# Patient Record
Sex: Male | Born: 1960 | Race: Black or African American | Hispanic: No | Marital: Married | Smoking: Former smoker
Health system: Southern US, Community
[De-identification: ages and names within clinical notes are randomized; demographics above are authoritative.]

## PROBLEM LIST (undated history)

## (undated) DIAGNOSIS — I429 Cardiomyopathy, unspecified: Secondary | ICD-10-CM

## (undated) DIAGNOSIS — Z720 Tobacco use: Secondary | ICD-10-CM

## (undated) DIAGNOSIS — I77810 Thoracic aortic ectasia: Secondary | ICD-10-CM

## (undated) DIAGNOSIS — R972 Elevated prostate specific antigen [PSA]: Secondary | ICD-10-CM

## (undated) DIAGNOSIS — I428 Other cardiomyopathies: Secondary | ICD-10-CM

## (undated) DIAGNOSIS — J449 Chronic obstructive pulmonary disease, unspecified: Secondary | ICD-10-CM

## (undated) DIAGNOSIS — N182 Chronic kidney disease, stage 2 (mild): Secondary | ICD-10-CM

## (undated) DIAGNOSIS — I493 Ventricular premature depolarization: Secondary | ICD-10-CM

## (undated) DIAGNOSIS — I10 Essential (primary) hypertension: Secondary | ICD-10-CM

## (undated) DIAGNOSIS — I4729 Other ventricular tachycardia: Secondary | ICD-10-CM

## (undated) DIAGNOSIS — G4733 Obstructive sleep apnea (adult) (pediatric): Secondary | ICD-10-CM

## (undated) DIAGNOSIS — E119 Type 2 diabetes mellitus without complications: Secondary | ICD-10-CM

## (undated) DIAGNOSIS — I219 Acute myocardial infarction, unspecified: Secondary | ICD-10-CM

## (undated) DIAGNOSIS — I480 Paroxysmal atrial fibrillation: Secondary | ICD-10-CM

## (undated) DIAGNOSIS — I2510 Atherosclerotic heart disease of native coronary artery without angina pectoris: Secondary | ICD-10-CM

## (undated) DIAGNOSIS — I272 Pulmonary hypertension, unspecified: Secondary | ICD-10-CM

## (undated) DIAGNOSIS — E7800 Pure hypercholesterolemia, unspecified: Secondary | ICD-10-CM

## (undated) HISTORY — DX: Cardiomyopathy, unspecified: I42.9

## (undated) HISTORY — DX: Tobacco use: Z72.0

## (undated) HISTORY — DX: Paroxysmal atrial fibrillation: I48.0

## (undated) HISTORY — DX: Essential (primary) hypertension: I10

## (undated) HISTORY — PX: PROSTATE BIOPSY: SHX241

## (undated) HISTORY — DX: Obstructive sleep apnea (adult) (pediatric): G47.33

## (undated) HISTORY — DX: Chronic obstructive pulmonary disease, unspecified: J44.9

## (undated) HISTORY — DX: Atherosclerotic heart disease of native coronary artery without angina pectoris: I25.10

---

## 2000-10-15 HISTORY — PX: CARDIAC CATHETERIZATION: SHX172

## 2001-07-10 ENCOUNTER — Emergency Department (HOSPITAL_COMMUNITY): Admission: EM | Admit: 2001-07-10 | Discharge: 2001-07-10 | Payer: Self-pay | Admitting: *Deleted

## 2001-08-03 ENCOUNTER — Inpatient Hospital Stay (HOSPITAL_COMMUNITY): Admission: EM | Admit: 2001-08-03 | Discharge: 2001-08-08 | Payer: Self-pay | Admitting: Internal Medicine

## 2001-08-03 ENCOUNTER — Encounter: Payer: Self-pay | Admitting: Internal Medicine

## 2001-08-05 ENCOUNTER — Encounter: Payer: Self-pay | Admitting: *Deleted

## 2001-10-02 ENCOUNTER — Encounter: Payer: Self-pay | Admitting: Internal Medicine

## 2001-10-02 ENCOUNTER — Emergency Department (HOSPITAL_COMMUNITY): Admission: EM | Admit: 2001-10-02 | Discharge: 2001-10-02 | Payer: Self-pay | Admitting: Internal Medicine

## 2002-03-03 ENCOUNTER — Emergency Department (HOSPITAL_COMMUNITY): Admission: EM | Admit: 2002-03-03 | Discharge: 2002-03-03 | Payer: Self-pay | Admitting: Emergency Medicine

## 2002-07-01 ENCOUNTER — Encounter: Payer: Self-pay | Admitting: Emergency Medicine

## 2002-07-01 ENCOUNTER — Emergency Department (HOSPITAL_COMMUNITY): Admission: EM | Admit: 2002-07-01 | Discharge: 2002-07-01 | Payer: Self-pay | Admitting: Emergency Medicine

## 2005-03-29 ENCOUNTER — Emergency Department (HOSPITAL_COMMUNITY): Admission: EM | Admit: 2005-03-29 | Discharge: 2005-03-29 | Payer: Self-pay | Admitting: *Deleted

## 2005-09-12 ENCOUNTER — Inpatient Hospital Stay (HOSPITAL_COMMUNITY): Admission: EM | Admit: 2005-09-12 | Discharge: 2005-09-13 | Payer: Self-pay | Admitting: Emergency Medicine

## 2005-09-15 ENCOUNTER — Emergency Department (HOSPITAL_COMMUNITY): Admission: EM | Admit: 2005-09-15 | Discharge: 2005-09-15 | Payer: Self-pay | Admitting: Emergency Medicine

## 2006-03-01 ENCOUNTER — Ambulatory Visit (HOSPITAL_COMMUNITY): Admission: RE | Admit: 2006-03-01 | Discharge: 2006-03-01 | Payer: Self-pay | Admitting: Family Medicine

## 2006-03-03 ENCOUNTER — Emergency Department (HOSPITAL_COMMUNITY): Admission: EM | Admit: 2006-03-03 | Discharge: 2006-03-03 | Payer: Self-pay | Admitting: Emergency Medicine

## 2006-03-05 ENCOUNTER — Inpatient Hospital Stay (HOSPITAL_COMMUNITY): Admission: EM | Admit: 2006-03-05 | Discharge: 2006-03-08 | Payer: Self-pay | Admitting: Emergency Medicine

## 2006-12-15 ENCOUNTER — Emergency Department (HOSPITAL_COMMUNITY): Admission: EM | Admit: 2006-12-15 | Discharge: 2006-12-15 | Payer: Self-pay | Admitting: Emergency Medicine

## 2006-12-17 ENCOUNTER — Emergency Department (HOSPITAL_COMMUNITY): Admission: EM | Admit: 2006-12-17 | Discharge: 2006-12-17 | Payer: Self-pay | Admitting: Emergency Medicine

## 2008-03-24 ENCOUNTER — Emergency Department (HOSPITAL_COMMUNITY): Admission: EM | Admit: 2008-03-24 | Discharge: 2008-03-24 | Payer: Self-pay | Admitting: Emergency Medicine

## 2010-07-25 ENCOUNTER — Ambulatory Visit: Payer: Self-pay | Admitting: Cardiology

## 2010-07-25 ENCOUNTER — Observation Stay (HOSPITAL_COMMUNITY): Admission: EM | Admit: 2010-07-25 | Discharge: 2010-07-26 | Payer: Self-pay | Admitting: Emergency Medicine

## 2010-07-26 ENCOUNTER — Encounter: Payer: Self-pay | Admitting: Cardiology

## 2010-07-31 ENCOUNTER — Emergency Department (HOSPITAL_COMMUNITY): Admission: EM | Admit: 2010-07-31 | Discharge: 2010-07-31 | Payer: Self-pay | Admitting: Emergency Medicine

## 2010-08-02 ENCOUNTER — Encounter (INDEPENDENT_AMBULATORY_CARE_PROVIDER_SITE_OTHER): Payer: Self-pay | Admitting: *Deleted

## 2010-08-02 ENCOUNTER — Encounter (HOSPITAL_COMMUNITY): Admission: RE | Admit: 2010-08-02 | Discharge: 2010-09-01 | Payer: Self-pay | Admitting: Cardiology

## 2010-08-02 ENCOUNTER — Ambulatory Visit: Payer: Self-pay | Admitting: Cardiology

## 2010-08-07 ENCOUNTER — Ambulatory Visit: Payer: Self-pay | Admitting: Cardiology

## 2010-08-07 DIAGNOSIS — I2510 Atherosclerotic heart disease of native coronary artery without angina pectoris: Secondary | ICD-10-CM

## 2010-08-07 DIAGNOSIS — I10 Essential (primary) hypertension: Secondary | ICD-10-CM | POA: Insufficient documentation

## 2010-08-07 DIAGNOSIS — R002 Palpitations: Secondary | ICD-10-CM

## 2010-08-21 ENCOUNTER — Encounter (INDEPENDENT_AMBULATORY_CARE_PROVIDER_SITE_OTHER): Payer: Self-pay | Admitting: *Deleted

## 2010-08-25 ENCOUNTER — Ambulatory Visit: Payer: Self-pay | Admitting: Cardiology

## 2010-09-06 ENCOUNTER — Encounter (INDEPENDENT_AMBULATORY_CARE_PROVIDER_SITE_OTHER): Payer: Self-pay | Admitting: *Deleted

## 2010-11-14 NOTE — Letter (Signed)
Summary: Work Writer at [employer]  [address] [place] [address]   Phone: [phone]  Fax: [phone]     August 02, 2010    James [name]   The above named patient had a STRESS TEST today that ended at 1:00 pm. Please excuse him from work till 08/03/10. Patient can return to work with out restrictions. Please take this into consideration when reviewing the time away from work/school.      Sincerely yours,  Architectural technologist

## 2010-11-14 NOTE — Letter (Signed)
Summary: Appointment - Missed  [facility] [place]  [address], [place] [address]   Phone: [phone]  Fax: [phone]     September 06, 2010 MRN: [id number]   James [name] [address] [place], [place]  [address]   Dear [name],  Our records indicate you missed your appointment on               09/06/10 NURSE VISIT             It is very important that we reach you to reschedule this appointment. We look forward to participating in your health care needs. Please contact us at the number listed above at your earliest convenience to reschedule this appointment.     Sincerely,    Glass blower/designer

## 2010-11-14 NOTE — Assessment & Plan Note (Signed)
Summary: post Eather [facility]/tg   Visit Type:  Follow-up Primary Provider:  Dr.Fanta  CC:  no cardiology complaints.  History of Present Illness: [name] is a very pleasant 50 y/o AAF we are seeing post hospitalization where we consulted for frequent PVC's and chest pain.  He has a history of of CAD with most recent cath in 2002 demonstrating nonobstructive CAD, hypertension, insomnia, and tobacco abuse.  He was ruled out for MI during hospitalization and was found to have excessive caffine use with Mt. Dew at home.  He has since cut down on it.  He was given antiinsomia medication -benedryl to assist him on outpatient basis by Dr. Sudie Bailey.  He is sleeping better and has increased energy.  He also had a nuclear stress test prior to this appointment and he is here to discuss the results.  He is without complaint.  Current Medications (verified): 1)  Amlodipine Besylate 10 Mg Tabs (Amlodipine Besylate) .... Take One Tablet By Mouth Daily 2)  Cetirizine Hcl 10 Mg Tabs (Cetirizine Hcl) .... Take 1 Tab Daily 3)  Aspirin 81 Mg Tbec (Aspirin) .... Take 1 Tab Daily 4)  Clonidine Hcl 0.1 Mg Tabs (Clonidine Hcl) .... Take 1 Tab Daily  Allergies (verified): No Known Drug Allergies PMH-FH-SH reviewed-no changes except otherwise noted  Review of Systems       All other systems have been reviewed and are negative unless stated above.   Vital Signs:  Patient profile:   50 year old male Height:      78 inches Weight:      271 pounds BMI:     31.43 Pulse rate:   77 / minute BP sitting:   152 / 81  (right arm)  Vitals Entered By: Dreama Saa, CNA (August 07, 2010 2:48 PM)  Physical Exam  General:  Well developed, well nourished, in no acute distress. Lungs:  Clear bilaterally to auscultation and percussion. Heart:  Non-displaced PMI, chest non-tender; regular rate and rhythm, S1, S2 without murmurs, rubs or gallops. Carotid upstroke normal, no bruit. Normal abdominal aortic size, no  bruits. Femorals normal pulses, no bruits. Pedals normal pulses. No edema, no varicosities. Abdomen:  Bowel sounds positive; abdomen soft and non-tender without masses, organomegaly, or hernias noted. No hepatosplenomegaly. Msk:  Back normal, normal gait. Muscle strength and tone normal. Pulses:  pulses normal in all 4 extremities Extremities:  No clubbing or cyanosis. Psych:  Normal affect.   EKG  Procedure date:  08/07/2010  Findings:      Normal sinus rhythm with rate of:70 bpm  Left bundle branch block.    Impression & Recommendations:  Problem # 1:  CAD (ICD-414.00) He is without complaint of chest pain.  His last cardiac cath in 2002 demonstrated nonobstructive CAD with normal LM, LAD, Cx 40-50%, RCA 20%, with normal systolic fx.  Nuclear study completed 08/02/2010 No clearly diagnostic ST-segment changed noted by standard criteria.  Ventricular ectopy was noted in early exercise.  Hypertensive response to exercise.  Perfusion imaging shows apical anteroseptal and inferolateral defects suggestive of a scar.  No active areas of ischemia are noted. LVEF 42%  with inferior hypokinesis. I will continue his current medication regimine at this time we will see him in 6 months unless he becomes symptomatic His updated medication list for this problem includes:    Amlodipine Besylate 10 Mg Tabs (Amlodipine besylate) .Marland Kitchen... Take one tablet by mouth daily    Aspirin 81 Mg Tbec (Aspirin) .Marland Kitchen... Take 1 tab  daily  Problem # 2:  ESSENTIAL HYPERTENSION, BENIGN (ICD-401.1) Blood pressure is not opitmal for someone with CAD.  Will increase norvasc to 10mg  daily from 5 mg.  Do not wish to increase clonidine at this time to avoid bradycardia at higher doses.  He will follow-up in a couple fo weeks to have BP checked on new dose. His updated medication list for this problem includes:    Amlodipine Besylate 10 Mg Tabs (Amlodipine besylate) .Marland Kitchen... Take one tablet by mouth daily    Aspirin 81 Mg Tbec  (Aspirin) .Marland Kitchen... Take 1 tab daily    Clonidine Hcl 0.1 Mg Tabs (Clonidine hcl) .Marland Kitchen... Take 1 tab daily  Problem # 3:  PALPITATIONS, OCCASIONAL (ICD-785.1) Assessment: Improved  His updated medication list for this problem includes:    Amlodipine Besylate 10 Mg Tabs (Amlodipine besylate) .Marland Kitchen... Take one tablet by mouth daily    Aspirin 81 Mg Tbec (Aspirin) .Marland Kitchen... Take 1 tab daily  Patient Instructions: 1)  Your physician recommends that you schedule a follow-up appointment in: 6 months 2)  Your physician has recommended you make the following change in your medication: amlodipine 10mg  daily 3)  You have been referred to nurse visit in 2 weeks Prescriptions: AMLODIPINE BESYLATE 10 MG TABS (AMLODIPINE BESYLATE) Take one tablet by mouth daily  #30 x 11   Entered by:   Teressa Lower RN   Authorized by:   Joni Reining, NP   Signed by:   Teressa Lower RN on 08/07/2010   Method used:   Electronically to        [facility] [phone]* (retail)       [address]       [place], [place]  [address]       Ph: [phone] or [phone]       Fax: [phone]   RxID:   [id number]

## 2010-11-14 NOTE — Letter (Signed)
Summary: Appointment - Missed  [facility] [place]  [address] [place] [address]   Phone: [phone]  Fax: [phone]     August 21, 2010 MRN: [id number]   James [name] [address] [place], [place]  [address]   Dear [name],  Our records indicate you missed your appointment on       08/21/10 NURSE VISIT   .                                    It is very important that we reach you to reschedule this appointment. We look forward to participating in your health care needs. Please contact us at the number listed above at your earliest convenience to reschedule this appointment.     Sincerely,    Glass blower/designer

## 2010-11-14 NOTE — Assessment & Plan Note (Signed)
Summary: 2 wk nurse visit per checkout on 08/07/10/tg  Nurse Visit   Vital Signs:  Patient profile:   50 year old male Weight:      276 pounds O2 Sat:      99 % on Room air Pulse rate:   82 / minute BP sitting:   154 / 90  (left arm)  Vitals Entered By: Larita Fife Via LPN (August 25, 2010 8:45 AM)  O2 Flow:  Room air  Current Medications (verified): 1)  Amlodipine Besylate 10 Mg Tabs (Amlodipine Besylate) .... Take One Tablet By Mouth Daily 2)  Cetirizine Hcl 10 Mg Tabs (Cetirizine Hcl) .... Take 1 Tab Daily 3)  Aspirin 81 Mg Tbec (Aspirin) .... Take 1 Tab Daily 4)  Clonidine Hcl 0.1 Mg Tabs (Clonidine Hcl) .... Take 1 Tab Daily  Allergies (verified): No Known Drug Allergies  Primary Provider:  Dr.Fanta   History of Present Illness: S: Pt. returns to office for 2 week BP check/nurse visit. B: On last OV with Joni Reining, NP on 08-07-10 pt. was advised to increase Norvasc to 10mg  for HTN.  A: Pt. has no complaints at this time. BP =154/90, he has not taken medications this am due to working 3rd shift, he takes meds at bedtime. He did not bring meds but states he is taking as directed and he did not bring BP diary.  R: Will call pt. with Joni Reining, NP's recommendation's, if any.  Prescriptions: HYDROCHLOROTHIAZIDE 25 MG TABS (HYDROCHLOROTHIAZIDE) take 1 tablet by mouth once daily  #30 x 3   Entered by:   Larita Fife Via LPN   Authorized by:   Joni Reining, NP   Signed by:   Larita Fife Via LPN on 04/54/0981   Method used:   Electronically to        [facility] [phone]* (retail)       [address]       [place], [place]  [address]       Ph: [phone] or [phone]       Fax: [phone]   RxID:   [id number]    Add HCTZ 25mg  p.p, Daily.  Want BP to be 130's systolic.  BP check by the end of the week on new medication.  Joni Reining NP  [facility]       Larita Fife Via LPN  August 28, 2010 5:03 PM  Pt. advised, he states he understands info. given.  Larita Fife Via LPN  August 29, 2010 4:40 PM

## 2010-11-14 NOTE — Letter (Signed)
Summary: Work Writer at [employer]  [address], [place] [address]   Phone: [phone]  Fax: [phone]     August 02, 2010    James [name]   The above named patient had a STRESS TEST that lasted till 1:00, please excuse him form work till Thursday 08/03/10 Please take this into consideration when reviewing the time away from work/school.      Sincerely yours,  Architectural technologist

## 2010-12-22 NOTE — Discharge Summary (Signed)
  NAME:  James [name], James [name]               ACCOUNT NO.:  [id number]  MEDICAL RECORD NO.:  [id number]          PATIENT TYPE:  OBV  LOCATION:  A310                          FACILITY:  [facility]  PHYSICIAN:  Pier Laux D. Felecia Shelling, MD   DATE OF BIRTH:  05-07-1961  DATE OF ADMISSION:  07/25/2010 DATE OF DISCHARGE:  10/12/2011LH                              DISCHARGE SUMMARY   DISCHARGE DIAGNOSES: 1. Chest pain, probably noncardiac. 2. History of hypertension. 3. Tobacco abuse. 4. History of coronary artery disease.  DISCHARGE MEDICATIONS: 1. Amlodipine 5 mg p.o. daily. 2. Zyrtec 10 mg p.o. daily.  DISPOSITION:  The patient was discharged to home in stable condition.  HOSPITAL COURSE:  This is a 50 year old male patient with a history of hypertension, who was brought to emergency room due to chest discomfort. He had serial EKGs and cardiac enzymes which were negative.  The patient had a cardiac catheterization in 2002, which showed nonobstructive coronary artery disease.  The patient was evaluated by Cardiology.  He was discharged home to be followed in outpatient.  DISCHARGE INSTRUCTIONS:  The patient will follow with Cardiology.     Graelyn Bihl D. Felecia Shelling, MD     TDF/MEDQ  D:  11/29/2010  T:  11/29/2010  Job:  [id number]  Electronically Signed by Avon Gully MD on 12/21/2010 08:23:15 AM

## 2010-12-27 LAB — BASIC METABOLIC PANEL
BUN: 21 mg/dL (ref 6–23)
BUN: 14 mg/dL (ref 6–23)
CO2: 26 mEq/L (ref 19–32)
CO2: 26 mEq/L (ref 19–32)
Calcium: 9.3 mg/dL (ref 8.4–10.5)
Calcium: 9.1 mg/dL (ref 8.4–10.5)
Chloride: 103 mEq/L (ref 96–112)
Chloride: 106 mEq/L (ref 96–112)
Creatinine, Ser: 1.14 mg/dL (ref 0.4–1.5)
Creatinine, Ser: 1.1 mg/dL (ref 0.4–1.5)
GFR calc Af Amer: >60 mL/min (ref >60)
GFR calc Af Amer: >60 mL/min (ref >60)
GFR calc non Af Amer: >60 mL/min (ref >60)
GFR calc non Af Amer: >60 mL/min (ref >60)
Glucose, Bld: 97 mg/dL (ref 70–99)
Glucose, Bld: 103 mg/dL — ABNORMAL HIGH (ref 70–99)
Potassium: 3.7 mEq/L (ref 3.5–5.1)
Potassium: 3.6 mEq/L (ref 3.5–5.1)
Sodium: 138 mEq/L (ref 135–145)
Sodium: 139 mEq/L (ref 135–145)

## 2010-12-27 LAB — CBC
HCT: 48.1 % (ref 39.0–52.0)
HCT: 47.5 % (ref 39.0–52.0)
Hemoglobin: 16.6 g/dL (ref 13.0–17.0)
Hemoglobin: 16.2 g/dL (ref 13.0–17.0)
MCH: 31.5 pg (ref 26.0–34.0)
MCH: 31.3 pg (ref 26.0–34.0)
MCHC: 34.5 g/dL (ref 30.0–36.0)
MCHC: 34.1 g/dL (ref 30.0–36.0)
MCV: 91.3 fL (ref 78.0–100.0)
MCV: 91.7 fL (ref 78.0–100.0)
Platelets: 187 10*3/uL (ref 150–400)
Platelets: 184 10*3/uL (ref 150–400)
RBC: 5.28 MIL/uL (ref 4.22–5.81)
RBC: 5.18 MIL/uL (ref 4.22–5.81)
RDW: 13.5 % (ref 11.5–15.5)
RDW: 13.3 % (ref 11.5–15.5)
WBC: 10 10*3/uL (ref 4.0–10.5)
WBC: 10 10*3/uL (ref 4.0–10.5)

## 2010-12-27 LAB — POCT CARDIAC MARKERS
CKMB, poc: <1 ng/mL — ABNORMAL LOW (ref 1.0–8.0)
CKMB, poc: <1 ng/mL — ABNORMAL LOW (ref 1.0–8.0)
Myoglobin, poc: 56 ng/mL (ref 12–200)
Myoglobin, poc: 37.1 ng/mL (ref 12–200)
Troponin i, poc: <0.05 ng/mL (ref 0.00–0.09)
Troponin i, poc: <0.05 ng/mL (ref 0.00–0.09)

## 2010-12-27 LAB — CARDIAC PANEL(CRET KIN+CKTOT+MB+TROPI)
CK, MB: 2.4 ng/mL (ref 0.3–4.0)
Relative Index: 1.7 (ref 0.0–2.5)
Total CK: 141 U/L (ref 7–232)
Troponin I: 0.03 ng/mL (ref 0.00–0.06)

## 2010-12-27 LAB — HEPATIC FUNCTION PANEL
ALT: 25 U/L (ref 0–53)
AST: 18 U/L (ref 0–37)
Albumin: 3.4 g/dL — ABNORMAL LOW (ref 3.5–5.2)
Alkaline Phosphatase: 73 U/L (ref 39–117)
Bilirubin, Direct: 0.1 mg/dL (ref 0.0–0.3)
Indirect Bilirubin: 0.8 mg/dL (ref 0.3–0.9)
Total Bilirubin: 0.9 mg/dL (ref 0.3–1.2)
Total Protein: 5.7 g/dL — ABNORMAL LOW (ref 6.0–8.3)

## 2010-12-27 LAB — DIFFERENTIAL
Basophils Absolute: 0 10*3/uL (ref 0.0–0.1)
Basophils Relative: 0 % (ref 0–1)
Eosinophils Absolute: 0.2 10*3/uL (ref 0.0–0.7)
Eosinophils Relative: 2 % (ref 0–5)
Lymphocytes Relative: 30 % (ref 12–46)
Lymphs Abs: 3 10*3/uL (ref 0.7–4.0)
Monocytes Absolute: 0.9 10*3/uL (ref 0.1–1.0)
Monocytes Relative: 9 % (ref 3–12)
Neutro Abs: 5.9 10*3/uL (ref 1.7–7.7)
Neutrophils Relative %: 59 % (ref 43–77)

## 2010-12-27 LAB — LIPID PANEL
Cholesterol: 182 mg/dL (ref 0–200)
HDL: 39 mg/dL — ABNORMAL LOW (ref >39)
LDL Cholesterol: 121 mg/dL — ABNORMAL HIGH (ref 0–99)
Total CHOL/HDL Ratio: 4.7 RATIO
Triglycerides: 111 mg/dL (ref <150)
VLDL: 22 mg/dL (ref 0–40)

## 2010-12-27 LAB — TSH: TSH: 1.242 u[IU]/mL (ref 0.350–4.500)

## 2011-03-02 NOTE — Consult Note (Signed)
NAME:  James [name], James [name]               ACCOUNT NO.:  [id number]   MEDICAL RECORD NO.:  [id number]          PATIENT TYPE:  INP   LOCATION:  A310                          FACILITY:  [facility]   PHYSICIAN:  J. Darreld Mclean, M.D. DATE OF BIRTH:  May 03, 1961   DATE OF CONSULTATION:  03/06/2006  DATE OF DISCHARGE:                                   CONSULTATION   CHIEF COMPLAINT:  My foot is infected.   The patient is a 50 year old male who has had swelling and tenderness of his  right dorsal foot and right dorsal ankle since last Thursday.  Today is  Wednesday.  He presented to the emergency room over the weekend.  He was  given antibiotics and came back yesterday to the hospital in more pain, and  tenderness.  For this he was then placed on IV clindamycin.  His ankle and  foot are markedly improved according to the family and patient.  He says  since he has been on IV antibiotics he is much better.  The uric acid is  normal.  X-rays are negative.  His redness and tenderness of the dorsum of  the right foot more in line with the great toe and second metatarsal  extending up to the anterior tibialis area.  There is redness, but there is  no actual fluctuance.  He has pain with motion of the ankle.  He has no  other joint complaints.  No other areas of pain.  He denies any trauma.  He  denies any puncture one.  He denies any change in shoe-wear.   IMPRESSION:  Cellulitis, right foot and ankle.   PLAN:  Continue the clindamycin.  It is doing well.  Elevate and K-Pad.  We  will follow with you.  Healing.           ______________________________  Shela Commons. Darreld Mclean, M.D.     JWK/MEDQ  D:  03/06/2006  T:  03/06/2006  Job:  [id number]

## 2011-03-02 NOTE — Discharge Summary (Signed)
NAME:  James [name], James [name]               ACCOUNT NO.:  [id number]   MEDICAL RECORD NO.:  [id number]          PATIENT TYPE:  INP   LOCATION:  A225                          FACILITY:  [facility]   PHYSICIAN:  Tesfaye D. Felecia Shelling, MD   DATE OF BIRTH:  03-03-1961   DATE OF ADMISSION:  09/12/2005  DATE OF DISCHARGE:  11/30/2006LH                                 DISCHARGE SUMMARY   DISCHARGE DIAGNOSES:  1.  Hypertensive encephalopathy.  2.  Hypokalemia.  3.  Hyponatremia.   DISCHARGE MEDICATIONS:  1.  Norvasc 5 mg p.o. daily.  2.  Lisinopril/hydrochlorothiazide 20/12.5 mg 1 tablet p.o. daily.   DISPOSITION:  The patient was discharged home in stable condition.   HOSPITAL COURSE:  This is a 50 year old male patient with a history of  hypertension who was brought to the emergency room due to change in mental  status.  The patient has a known case of hypertension.  However, he was  noncompliant with his medications.  On admission, the patient had severely  elevated blood pressure in the range of 199/104.  His blood pressure came  down to 177/88 after he was given _____________ in the emergency room.  The  patient was admitted and he was monitored.  His CT scan and MRI were  negative for any acute event.  He was also evaluated by the neurologist.  The patient was started back on his antihypertensive medications.  His blood  pressure was controlled and he was discharged after his mental status  improved and he came back to his baseline.      Tesfaye D. Felecia Shelling, MD  Electronically Signed     TDF/MEDQ  D:  10/16/2005  T:  10/16/2005  Job:  [id number]

## 2011-03-02 NOTE — Procedures (Signed)
[facility]  Patient:    James [name], James [name] Visit Number: [id number] MRN: [id number]          Service Type: Attending:  Kari Baars, M.D. Dictated by:   Kari Baars, M.D. Proc. Date: 08/04/99                            EKG Interpretations  The rhythm is a sinus rhythm with a rate in the 50s.  The axis is leftward, but does not meet criteria for left axis deviation.  There are T-wave abnormalities which are diffuse and nonspecific which could represent ischemia and clinical correlation was suggested.  Abnormal electrocardiogram. Dictated by:   Kari Baars, M.D. Attending:  Kari Baars, M.D. DD:  08/04/01 TD:  08/05/01 Job: [id number]

## 2011-03-02 NOTE — Group Therapy Note (Signed)
[facility]  Patient:    James [name], James [name]. Visit Number: [id number] MRN: [id number]          Service Type: MED Location: 3700 [id number] [id number] Attending Physician:  Sherryl Manges Coch Dictated by:   Kari Baars, M.D. Proc. Date: 08/22/01 Admit Date:  08/06/2001 Discharge Date: 08/08/2001                               Progress Note  TIME:  15:37, August 03, 2001  PROCEDURE:  Electrocardiogram.  ATTENDING:  Kari Baars, M.D.  FINDINGS:  This EKG is confirmed. Dictated by:   Kari Baars, M.D. Attending Physician:  Burnett Kanaris DD:  08/22/01 TD:  08/23/01 Job: [id number]

## 2011-03-02 NOTE — Consult Note (Signed)
James [name], James [name]               ACCOUNT NO.:  [id number]   MEDICAL RECORD NO.:  [id number]          PATIENT TYPE:  INP   LOCATION:  A225                          FACILITY:  [facility]   PHYSICIAN:  Kofi A. Gerilyn Pilgrim, M.D. DATE OF BIRTH:  03/02/1961   DATE OF CONSULTATION:  09/13/2005  DATE OF DISCHARGE:                                   CONSULTATION   ASSESSMENT:  Spell of confusion in setting of hypertension.  Hypotension  encephalopathy has been raised which could be a possibility.  I am  concerned, however, that the patient may have had a seizure which should be  evaluated further.   RECOMMENDATIONS:  To this end, I am going to suggest an EEG to be done and  for him to follow up with [name] in the office.   HISTORY OF PRESENT ILLNESS:  A middle-aged, 50 year old, African-American  gentleman who presented with relatively acute confusional episode.  No loss  of consciousness has been reported.  The event seemed to last for a few  hours.  The workup has been initially positive for a blood pressure of  199/104 in the left arm and subsequently dropped to 177/88.  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  However, images of the  brain failed to reveal any signs of posterior leukoencephalopathy/white  matter hyperintensity.  The patient had returned to baseline without any  focal deficits.  The patient reports having a spell about 1-2 years ago  where he passed out/blacked out for about 4 hours and gradually regained  consciousness.  In the hospital, it was also noted that the patient had a  syncopal episode in 2002.  He does have history of nonobstructive coronary  disease.  He has had a Cardiolite stress test which shows ejection fraction  of 60%.  He does have a history of hypertension.   PAST MEDICAL HISTORY:  Hypertension.   CURRENT MEDICATIONS:  Potassium, triamterene/hydrochlorothiazide and  Catapres.   PHYSICAL EXAMINATION:  GENERAL:  A pleasant man in no acute  distress.  The  patient is currently awake and alert.  He converses well.  VITAL SIGNS:  Temperature 97.0, pulse 103, respirations 20, blood pressure  139/79.  NEUROLOGIC:  No focal deficits are reported.   LABORATORY DATA AND X-RAY FINDINGS:  MRI of the brain was reviewed.  No  acute ischemic changes are noted on diffusion imaging.  The white matter  appears perfect.  There is no single lesion noted.  Essentially negative  scan.   Urine drug screen was negative.  Urinalysis was fine.  WBC 30, hemoglobin  15, platelet count 204.  Sodium 134, potassium 3.3, chloride 102, CO2 24,  glucose 110, BUN 13, creatinine 1.0, calcium 8.2.  Liver enzymes normal.   Thank you for this consultation.      Kofi A. Gerilyn Pilgrim, M.D.  Electronically Signed     KAD/MEDQ  D:  09/13/2005  T:  09/13/2005  Job:  [id number]

## 2011-03-02 NOTE — Discharge Summary (Signed)
James [name], James [name]               ACCOUNT NO.:  [id number]   MEDICAL RECORD NO.:  [id number]          PATIENT TYPE:  INP   LOCATION:  A310                          FACILITY:  [facility]   PHYSICIAN:  Corrie Mckusick, M.D.  DATE OF BIRTH:  [birth year]   DATE OF ADMISSION:  03/05/2006  DATE OF DISCHARGE:  05/25/2007LH                                 DISCHARGE SUMMARY   HISTORY OF PRESENT ILLNESS:  Please see admission H&P.   PAST MEDICAL HISTORY:  Please see admission H&P.   HOSPITAL COURSE:  The patient is a 50 year old gentleman with history of  hypertension who presents with four to five days of right lower foot  swelling.  He went to the emergency department two days prior to admission  and was placed on antibiotics which was clindamycin, with little  improvement.  He came to the emergency department for continued swelling.  He was admitted for IV antibiotics, and we had an orthopedics consult to get  their input.   Orthopedics agreed with the use of __________, clindamycin, and followed  with [name].  No other real input.  Blood cultures remained negative during the  stay.  The foot improved greatly, even after 24 hours of antibiotics.  Antibiotics were continued.   Two days after admission, the patient felt remarkably well.  No fevers.  Again, blood cultures were negative.  White count was 9.6.  It was decided  to elevate and give him one more day on IV antibiotics.  The patient was  ready for discharge the following day, and Dr. Regino Schultze saw him and  discharged him on clindamycin 300 mg q.i.d. and Vicodin p.r.n.  He was going  to continue on his pre-hospital medications of Norvasc 10 mg daily and  lisinopril/HCTZ 20/12.5 p.o. daily.   DISCHARGE PHYSICAL EXAMINATION:  Please see note on day of discharge for  details.   CONDITION ON DISCHARGE:  Improved and stable.   FOLLOW UP:  He is going to follow up with me in the office in one week.      Corrie Mckusick, M.D.  Electronically Signed     JCG/MEDQ  D:  03/25/2006  T:  03/25/2006  Job:  [id number]

## 2011-03-02 NOTE — Discharge Summary (Signed)
[facility]  Patient:    James [name], James [name]. Visit Number: [id number] MRN: [id number]          Service Type: MED Location: 3700 3703 [id number] Attending Physician:  Sherryl Manges Coch Dictated by:   Avon Gully, M.D. Admit Date:  08/06/2001 Discharge Date: 08/08/2001                             Discharge Summary  DISCHARGE DIAGNOSES: 1. Syncopal episode. 2. Coronary artery disease. 3. Hypertension. 4. Hypertensive heart disease.  DISPOSITION:  The patient was transferred to [facility] under cardiology service for cardiac catheterization.  HOSPITAL COURSE:  This is a 50 year old black male with history of hypertension who was admitted to [facility] on August 03, 2001, due to a syncopal episode.  On the day of admission the patient claims he went outside to get some wood, however, he suddenly passed out on the ground. He was brought to the emergency room where he was evaluated and was admitted under telemetry.  Serial cardiac enzymes and EKG was performed.  His EKG showed an abnormal Q wave depression.  His stress Cardiolite showed a mild anterior wall hypokinesia with an ejection fraction of 45%.  The patient was then transferred to [facility] under the cardiology service for cardiac catheterization. Dictated by:   Avon Gully, M.D. Attending Physician:  Burnett Kanaris DD:  08/19/01 TD:  08/20/01 Job: [id number]

## 2011-03-02 NOTE — Cardiovascular Report (Signed)
[place]  Patient:    James [name], James [name]. Visit Number: [id number] MRN: [id number]          Service Type: MED Location: 3700 [id number] [id number] Attending Physician:  Nathen May Dictated by:   Daisey Must, M.D. [facility] Proc. Date: 08/08/01 Admit Date:  08/06/2001 Discharge Date: 08/08/2001   CC:         Avon Gully, M.D., Larina Bras, M.D. [facility]  Cardiac Catheterization Lab   Cardiac Catheterization  PROCEDURE:  Right and left heart catheterization with coronary angiography and left ventriculography.  CARDIOLOGIST:  Daisey Must, M.D. [facility]  INDICATION:  [name] is a 50 year old male with cardiovascular risk factors who was admitted to [facility] after an episode of syncope.  Prior to this, he had an episode of nocturnal dyspnea.  His initial EKG showed inferolateral T wave inversion.  Cardiolite scan showed suggestion of decreased left ventricular systolic function with no evidence of ischemia.  He was, therefore, referred for cardiac catheterization to rule out structural or ischemic heart disease.  CATHETERIZATION PROCEDURAL NOTE:  A 6-French sheath was placed in the right femoral artery.  Standard Judkins 6-French catheters were utilized.  Contrast was Omnipaque.  There were no complications.  CATHETERIZATION RESULTS:  HEMODYNAMICS:  Left ventricular pressure 160/15, aortic pressure 160/86. There was no aortic valve gradient.  LEFT VENTRICULOGRAM:  Wall motion is normal.  Ejection fraction calculated at 60%.  There is trace mitral regurgitation.  CORONARY ANGIOGRAPHY: (Right dominant).  Left main is normal.  Left anterior descending artery has minor luminal irregularities in the mid vessel.  The LAD gives rise to two normal size diagonal branches.  Left circumflex has a 40 to 50% stenosis in the distal vessel beyond a large OM-2.  The vessel is relatively small at this point.  The circumflex  gives rise to a small OM-1, large OM-2, and small third and fourth marginal branches.  Right coronary artery has a 20% stenosis in the mid vessel.  It gives rise to a normal size posterior descending artery and normal size posterolateral branch.  IMPRESSIONS: 1. Normal left ventricular systolic function. 2. No significant coronary artery disease identified. Dictated by:   Daisey Must, M.D. [facility] Attending Physician:  Nathen May DD:  08/08/01 TD:  08/10/01 Job: [id number]

## 2011-03-02 NOTE — H&P (Signed)
[facility]  Patient:    James [name], James [name]. Visit Number: [id number] MRN: [id number]          Service Type: MED Location: 2A A204 01 Attending Physician:  Cassell Smiles. Admit Date:  08/03/2001                           History and Physical  DATE OF BIRTH:  2061/04/03  HISTORY OF PRESENT ILLNESS:  The patient is a 50 year old black male followed by Dr. Felecia Shelling with a past medical history remarkable for hypertension on atenolol about one year.  He has had no medication over the past three or four day.  He was in his usual state of health until this morning when he awaken and went outside to get some wood.  The next thing he knew, he was on the ground.  He denies any presyncopal symptoms.  He sustained a left elbow abrasion.  He initially felt a little disoriented, but when he "came to," he was able to get up, get about, and go back in the house and inform his wife. There was no history of incontinence, no dizziness, no orthostasis, and no chest pain, shortness of breath, or other complaints.  He had an episode about one month ago when he awaken suddenly in bed, feeling as if his breath had been cut off, but this resolved.  He does admit to nighttime reflux.  The review of systems is essentially unremarkable.  He has lost about 20 pounds in the last several months intentionally.  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  He has had no chest pain, shortness of breath, or change in exercise habits.  In the emergency room, he was in no distress.  His vital signs were stable.  He does have a sinus bradycardia.  His EKG revealed inverted Ts in inferior leads and lateral V3-6.  The initial CK was 216 with an MB of 2.8, a troponin of 0.03, and a relative index of 1.3.  The remaining laboratory data was otherwise unremarkable.  He had an alcohol level of 7 (normal 0-10).  He denies drinking any alcohol.  I am not sure what this means.  SOCIAL HISTORY:  Lives  with his wife.  He has three children, alive and well. Self-employed as well as works as a Merchandiser, retail at [employer].  Tobacco abuse x about 22 years of one pack per day.  No other drug or alcohol use.  MEDICATIONS:  Atenolol 100 mg daily.  No over-the-counter medications.  No herbal supplements or vitamins.  FAMILY HISTORY:  Positive MI in his father probably in his 81s with diabetes and hypertension as well.  Mother alive and well.  Three siblings, one with hypertension.  I assume he is the second of four children since he names himself as the middle.  REVIEW OF SYSTEMS:  No change in exercise tolerance.  Positive snoring. Usually sleeps well.  Occasional reflux symptoms, particularly at night.  None recently due to lifestyle changes.  Probably also is related to weight loss. No previous history of syncope or seizures.  He was an active athlete without any history.  No previous history of surgeries or hospitalizations.  ASSESSMENT AND PLAN: 1. Syncopal episode with worrisome electrocardiographic findings in a    gentleman with hypertension and a positive family history.  He has been    told that his cholesterol is high.  I do not have a lipid  profile, although    this has been ordered.  I thought this is a CNS event.  He did have a CT in    the emergency room which was reported as unremarkable.  Will admit,    monitor, and check enzymes.  Add aspirin.  Continue atenolol.  He is    bradycardic and has not had his medications in several days.  this will    need to be further evaluated.  Will have cardiology evaluate this patient    given these findings. 2. Tobacco abuse. 3. Obesity, improved. 4. Reflux.  The plan was discussed with the patient and his wife, who seemed to have reasonable understanding. Attending Physician:  Cassell Smiles DD:  08/03/01 TD:  08/04/01 Job: [id number]

## 2011-03-02 NOTE — H&P (Signed)
James [name], James [name]               ACCOUNT NO.:  [id number]   MEDICAL RECORD NO.:  [id number]          PATIENT TYPE:  EMS   LOCATION:  ED                            FACILITY:  [facility]   PHYSICIAN:  Mila Homer. Sudie Bailey, M.D.DATE OF BIRTH:  1961/07/18   DATE OF ADMISSION:  09/11/2005  DATE OF DISCHARGE:  LH                                HISTORY & PHYSICAL   This 50 year old reported to work at [employer] and was confused.  He was brought to the [facility] Emergency Room for workup.   He is a patient of Dr. Avon Gully.  Apparently he stopped all his blood  pressure pills about 4 months ago.  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  Further, he was smoking and  stopped his smoking about a week ago.   Patient has really had no fever or chills.  He had left earache within the  last few days but not now.  He has had no sore throat.  He has had a frontal  headache, apparently takes a lot of headache pills at [employer].  Further, he  has had epigastric abdominal pain off and on for some months and has been  concerned about this but has not had workup by a physician.   He had a similar episode of confusion about 4 years ago.   Based on his discharge summary through Dr. Avon Gully, he had suddenly  passed out on the ground.  He had a syncopal episode, coronary artery  disease, hypertension, hypertensive heart disease.  Ejection fraction at  that time was about 45%.  He had a stress Cardiolite which showed mild  dilatation of left ventricle and moderate anterior wall hyperkinesis.  His  cardiac cath showed a 20% mid RCA, a 40-50% mid circumflex and a 20% mid  LAD, ejection fraction to be 60%.  He is felt to have nonobstructive  coronary artery disease based on these findings.  The syncope was felt to be  of unknown etiology.  He was recommended to stop smoking, have his blood  pressure checked at home, and he is to take Altace 5 mg daily  at bedtime and  not take his atenolol.   At this particular time he has not had fever or chills, earache was  mentioned but was just fleeting, apparently he has not had sore throat, neck  pain, neck stiffness, chest pain, palpitations but has had the epigastric  abdominal pain intermittently as noted above.  He has had no urinary  symptomatology although he did have diarrhea three times recently.   Apparently meds he has been on most recently include Altace and triamterene  37.5/hydrochlorothiazide 25 mg daily.   Admission workup showed temperature 99 degrees p.o., blood pressure 199/104  in the left arm, this value dropped to 177/88, pulse 92, respiratory rate  22, his O2 saturation is 100%.  At the time I examined him he appeared to be  oriented, alert, still a little bit foggy.  His family was in attendance  including his sister, mother and girlfriend.  He was well developed, well  nourished, no acute distress.  His pupils equal reactive to light.  EOMs  were intact.  His TMs were gray, good light reflexes and landmarks  bilaterally.  The pharynx was normal.  There were negative anterior cervical  nodes. No maxillary or frontal sinus pain.  The lungs were clear throughout.  The heart had a regular rhythm rate of about 70.  There is no  supraclavicular adenopathy.  The abdomen was soft without hepatosplenomegaly  or mass or epigastric pain.  There is no edema of the ankles.   Blood work included a white cell count of 13,500 of which 88% were  neutrophils, 7% lymphs.  The H&H were 15.6 and 45.5, MCV of 90, platelet  count of 204,000.  His blood test showed a sodium of 134, potassium 3.3,  glucose of 110, BUN 13, creatinine 1.  LFTs were normal.  B-type natriuretic  peptide was less than 30.   His urine drug screen was negative.  UA showed an SG of 1.020 and a pH of 7.  There were 0-2 wbc's and 0-2 rbc's per HPF.  Troponin was less than 0.05.  Myoglobin 116.   ADMISSION  DIAGNOSES:  1.  Possible hypertensive encephalopathy.  2.  Sleep deprivation.  3.  Hypokalemia.  4.  Hyponatremia.  5.  History of tobacco use disorder.  6.  Nonobstructive coronary artery disease.  7.  Essential hypertension.   Plan with him is to admit him to observation and have him seen by him LMD,  Dr. Felecia Shelling in the morning.  We will treat him with clonidine 0.2 mg q.4 h.  for systolic BPs greater than 160 and diastolic BPs greater than 110.  We  will put him on 2 g sodium diet and have dietitian review with him in the  morning a 2 g sodium diet, keep him on IV of normal saline KVO and put him  on KCl 20 mEq twice daily and Maxzide 25/37.5 daily.      Mila Homer. Sudie Bailey, M.D.  Electronically Signed     SDK/MEDQ  D:  09/11/2005  T:  09/11/2005  Job:  [id number]
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
## 2011-03-02 NOTE — H&P (Signed)
. [facility]  Patient:    James [name], James [name]. Visit Number: [id number] MRN: [id number]          Service Type: MED Location: 2A A204 01 Attending Physician:  Cassell Smiles. Admit Date:  08/03/2001                           History and Physical  CONTINUATION  GENERAL APPEARANCE:  [name] is an alert, oriented, well-developed, well-nourished, slightly obese black male in no acute distress lying near supine.  Able to get up and move about independently.  NEUROLOGIC:  Exam is completely intact, including cranial nerves.  LUNGS:  Clear to auscultation anteriorly and posteriorly, though slightly diminished secondary to habitus.  HEART:  Regular rate and rhythm.  I cannot appreciate a murmur or gallop.  ABDOMEN:  Obese, soft, and nontender.  EXTREMITIES:  Without clubbing, cyanosis, or edema.  NECK:  Supple.  No JVD, adenopathy, thyromegaly, or bruits. Attending Physician:  Cassell Smiles DD:  08/03/01 TD:  08/04/01 Job: [id number]

## 2011-03-02 NOTE — Procedures (Signed)
[name], James [name]               ACCOUNT NO.:  [id number]   MEDICAL RECORD NO.:  [id number]          PATIENT TYPE:  INP   LOCATION:  A225                          FACILITY:  [facility]   PHYSICIAN:  Dani Gobble, MD       DATE OF BIRTH:  25-Jun-1961   DATE OF PROCEDURE:  09/12/2005  DATE OF DISCHARGE:  09/13/2005                                  ECHOCARDIOGRAM   REFERRING:  Dr. Felecia Shelling.   INDICATIONS:  Mr. Robitaille is a 50 year old gentleman with past medical history  of hypertensive encephalopathy referred for chest pain and to evaluate LV  function.   Technical quality of this study is adequate.   The aorta measures dilated at the root at 4.6 cm. It does appear to taper  down beyond the root, but this is not well visualized.   The left atrium is mildly dilated measured at 4.2 cm. The patient appeared  to be in sinus rhythm during this procedure. No obvious clots or masses were  appreciated.   The interventricular septum posterior wall are mildly concentrically  thickened.   The aortic valve appeared to be trileaflet with normal leaflet excursion. No  significant aortic insufficiency is noted. Doppler interrogation of the  aortic valve is within normal limits.   The mitral valve appeared grossly structurally normal. No obvious mitral  valve prolapse is appreciated. Mild mitral regurgitation is noted. Doppler  interrogation of the mitral valve is within normal limits.   Pulmonic valve appears grossly structurally normal.   Tricuspid valve also appears grossly structurally normal with mild tricuspid  regurgitation noted.   The left ventricle measures at the upper limits of normal in size with the  LVIDD measured t 5.6 cm and the LVISD measured at 3.4 cm. Overall left  systolic function is normal, and no regional wall motion abnormalities are  appreciated.   The right atrium and right ventricle appear normal in size. Right ventricle  systolic function appears to be normal.   The  inner atrial septum in one view only is somewhat suggestive of a PFO.  This is not well visualized.   IMPRESSION:  1.  Mildly dilated aortic root at 4.6 cm, which appears to taper down as you      move into the ascending aorta; however, this is not well visualized.  2.  Mild left atrial enlargement.  3.  Mild concentric LVH.  4.  Mild mitral and tricuspid regurgitation.  5.  Left ventricle at the upper limits of normal in size with normal left      ventricle systolic function and no regional wall motion abnormality is      noted.  6.  There is a subtle suggestion in one view only of a PFO; however, this is      not well visualized: Consider contrast study for further delineation.           ______________________________  Dani Gobble, MD     AB/MEDQ  D:  09/12/2005  T:  09/13/2005  Job:  [id number]

## 2011-03-02 NOTE — Procedures (Signed)
[facility]  Patient:    James [name], James [name]. Visit Number: [id number] MRN: [id number]          Service Type: MED Location: 2A A204 01 Attending Physician:  Cassell Smiles. Dictated by:   Marcelino Duster    P.A.-C. Admit Date:  08/03/2001                                Stress Test  This is a 50 year old black male patient admitted with syncope.  Baseline EKG: Sinus bradycardia at 46 beats per minute with T-wave inversion in the inferior lateral lead.  Patient exercised 11 minutes 25 seconds with the Bruce protocol, but stopped due to fatigue and dizziness.  He had no chest pain.  He obtained a heart rate of 141.  Target heart rate was 153.  He did have frequent PVCs and bigemini.  Target heart rate was not reached because baseline was in the 40s and he takes the atenolol at home.  It was held today. He had no EKG changes and Cardiolite images are to follow.  Symptoms resolved quickly in recovery. Dictated by:   Marcelino Duster    P.A.-C. Attending Physician:  Cassell Smiles DD:  08/05/01 TD:  08/06/01 Job: [id number]

## 2011-03-02 NOTE — H&P (Signed)
NAME:  James [name], James [name]               ACCOUNT NO.:  [id number]   MEDICAL RECORD NO.:  [id number]          PATIENT TYPE:  INP   LOCATION:  A310                          FACILITY:  [facility]   PHYSICIAN:  Corrie Mckusick, M.D.  DATE OF BIRTH:  [birth year]   DATE OF ADMISSION:  03/05/2006  DATE OF DISCHARGE:  LH                                HISTORY & PHYSICAL   ADMITTING DIAGNOSES:  Right foot cellulitis.   HISTORY OF PRESENT ILLNESS:  This is a 50 year old gentleman with history of  hypertension who presents with four to five days of right lower foot  swelling.  He had been seen in the emergency department two days prior to  admission, placed on antibiotics which I believe was clindamycin with little  improvement.  Came in the emergency department and had continued swelling.  It was decided to admit him to the hospital for IV antibiotics.  Really no  other complaints.  Cardiovascular, respiratory, GI, neurologic review of  systems all negative.   PAST MEDICAL HISTORY:  1.  Hypertension.  2.  Non-obstructive coronary artery disease.  3.  Tobacco use.   PAST SURGICAL HISTORY:  None.   MEDICATIONS ON ADMISSION:  1.  Norvasc 10 mg p.o. daily.  2.  Lisinopril/HCTZ 20/12.5 p.o. daily.   ALLERGIES:  No known drug allergies.   SOCIAL HISTORY:  Does smoke.  No illicit drug use.  No alcohol use.   FAMILY HISTORY:  Significant for hypertension.  No prior history of  diabetes.   PHYSICAL EXAMINATION:  VITAL SIGNS:  Temperature 98, blood pressure 150/89,  respiratory rate 20, heart rate 74.  GENERAL:  Pleasant general, talkative, in no acute distress.  CHEST:  Clear to auscultation bilaterally.  CARDIOVASCULAR:  Regular rate and rhythm.  Normal S1-2.  No S3, murmurs,  gallops, or rubs.  ABDOMEN:  Soft, nontender.  EXTREMITIES:  Right lower extremity has swelling and warmth on the foot,  clearly warm over the dorsum of the foot, questionable site of infection.  It looks like there might be a  small abscess as well.   Blood cultures drawn.  X-ray of the foot both from the 18th and the 20th are  negative.   ASSESSMENT/PLAN:  A 50 year old gentleman with hypertension, right lower  foot cellulitis.   PLAN:  1.  Admit for double coverage of Rocephin and clindamycin IV.  2.  Orthopedics to take a look and get their input.  Will continue to follow      closely.      Corrie Mckusick, M.D.  Electronically Signed     JCG/MEDQ  D:  03/06/2006  T:  03/06/2006  Job:  [id number]

## 2011-07-12 LAB — BASIC METABOLIC PANEL
BUN: 20
CO2: 28
Calcium: 9.2
Chloride: 103
Creatinine, Ser: 1.29
GFR calc Af Amer: >60
GFR calc non Af Amer: 60 — ABNORMAL LOW
Glucose, Bld: 103 — ABNORMAL HIGH
Potassium: 4.5
Sodium: 138

## 2011-07-12 LAB — B-NATRIURETIC PEPTIDE (CONVERTED LAB): Pro B Natriuretic peptide (BNP): <30

## 2011-07-12 LAB — POCT CARDIAC MARKERS
CKMB, poc: 2.3
Myoglobin, poc: 65.8
Myoglobin, poc: 71.2

## 2011-07-12 LAB — CBC
HCT: 47.4
Hemoglobin: 16.3
MCHC: 34.4
MCV: 91.7
Platelets: 191
RBC: 5.17
RDW: 13.3
WBC: 9.3

## 2011-07-12 LAB — DIFFERENTIAL
Basophils Absolute: 0
Basophils Relative: 0
Lymphocytes Relative: 24
Neutro Abs: 6.1
Neutrophils Relative %: 66

## 2011-07-25 IMAGING — NM NM MYOCAR MULTI W/SPECT W/WALL MOTION & EF
2 series · 12 of 12 positions shown · non-contrast
Comparison: none

Ordering Physician: Jaylon Aujla

[place] Physician: [REDACTED]al Data: 49-year-old male with history of nonobstructive CAD,
hypertension, tobacco abuse, and recent palpitations as well as
chest discomfort.  This study is requested to evaluate for the
presence of ischemia.
NUCLEAR MEDICINE STRESS MYOVIEW STUDY WITH SPECT AND LEFT
VENTRICULAR EJECTION FRACTION
Radionuclide Data: One-day rest/stress protocol performed with
[DATE] mCi of Mc-BBm Myoview.
Stress Data: The patient was exercised on a Bruce protocol for 10
minutes and 52 seconds achieving a maximum workload of 12.3 mets.
Heart rate increased from 65 beats per minute up to 162 beats per
minute which was 94% of the maximum age predicted heart rate
response.  Blood pressure increased from 162/88 up to 202/90.  No
chest pain was reported.  Bursts of nonsustained ventricular
tachycardia were noted, up to four beats, with some ventricular
couplets and occasional to frequent PVCs.  These are mainly noted
in early exercise, generally suppressed with increased heart rate.
No clearly diagnostic ST-segment changes were noted by standard
criteria.
EKG: Resting electrocardiogram shows normal sinus rhythm at 61
beats per minute with inferolateral T-wave inversions, nonspecific
ST changes.  Poor anterior R-wave progression.
Scintigraphic Data: Analysis of the raw perfusion data finds
adequate radiotracer uptake with mild soft tissue attenuation.
Tomographic views were obtained using the the short axis, vertical
long axis, and horizontal long axis planes.  There is a small,
moderate intensity apical anteroseptal defect that is fixed.  There
is also a small, moderate intensity inferolateral defect that is
fixed.  No significant areas of reversibility are noted to indicate
ischemia.
Gated imaging reveals an EDV of 186, ESV 108, T I D ratio of 0.75,
and LVEF of 42% with inferior hypokinesis.

[Series 1: cr cardiac tc low dose · 6.41mm/px · 6 of 64 frames shown]
[frame 6/64]
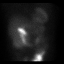
[frame 16/64]
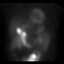
[frame 27/64]
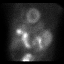
[frame 38/64]
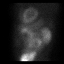
[frame 48/64]
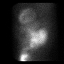
[frame 59/64]
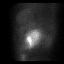

[Series 1: cs cardiac tc hi dose · 6.41mm/px · 6 of 512 frames shown]
[frame 43/512]
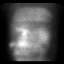
[frame 128/512]
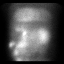
[frame 214/512]
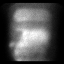
[frame 299/512]
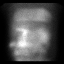
[frame 384/512]
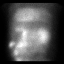
[frame 470/512]
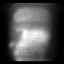

[12 of 12 positions shown; findings below may reference images not displayed]

IMPRESSION: Abnormal exercise Myoview.  No clearly diagnostic ST-segment
changes are noted by standard criteria.  Ventricular ectopy
including brief nonsustained ventricular tachycardia (four beats at
most) was noted in early exercise, suppressed at increased heart
rate.  Hypertensive response noted to exercise.  Perfusion imaging
shows apical anteroseptal and inferolateral defects as described
above, suggestive of scar.  No active areas of ischemia are noted.
LVEF reduced to 42% with inferior hypokinesis.  This would be
consistent with underlying CAD.

## 2011-09-24 ENCOUNTER — Encounter: Payer: Self-pay | Admitting: Cardiology

## 2011-09-24 ENCOUNTER — Telehealth: Payer: Self-pay

## 2011-09-24 NOTE — Telephone Encounter (Signed)
LMOM for pt to call. 

## 2011-10-02 NOTE — Telephone Encounter (Signed)
Letter mailed to pt to call.  

## 2012-03-04 ENCOUNTER — Other Ambulatory Visit (HOSPITAL_COMMUNITY): Payer: Self-pay | Admitting: Internal Medicine

## 2012-03-05 ENCOUNTER — Ambulatory Visit (HOSPITAL_COMMUNITY)
Admission: RE | Admit: 2012-03-05 | Discharge: 2012-03-05 | Disposition: A | Discharge status: Home / Self Care | Payer: BC Managed Care – PPO | Source: Ambulatory Visit | Attending: Internal Medicine | Admitting: Internal Medicine

## 2012-03-05 ENCOUNTER — Other Ambulatory Visit (HOSPITAL_COMMUNITY): Payer: Self-pay | Admitting: Internal Medicine

## 2012-03-05 DIAGNOSIS — R52 Pain, unspecified: Secondary | ICD-10-CM

## 2012-03-05 DIAGNOSIS — R932 Abnormal findings on diagnostic imaging of liver and biliary tract: Secondary | ICD-10-CM | POA: Insufficient documentation

## 2012-03-05 DIAGNOSIS — R109 Unspecified abdominal pain: Secondary | ICD-10-CM | POA: Insufficient documentation

## 2012-03-06 ENCOUNTER — Other Ambulatory Visit (HOSPITAL_COMMUNITY): Payer: Self-pay | Admitting: Internal Medicine

## 2012-03-06 DIAGNOSIS — R109 Unspecified abdominal pain: Secondary | ICD-10-CM

## 2012-03-07 ENCOUNTER — Encounter (HOSPITAL_COMMUNITY): Payer: Self-pay

## 2012-03-07 ENCOUNTER — Ambulatory Visit (INDEPENDENT_AMBULATORY_CARE_PROVIDER_SITE_OTHER): Payer: BC Managed Care – PPO | Admitting: Adult Health

## 2012-03-07 ENCOUNTER — Other Ambulatory Visit: Payer: Self-pay | Admitting: Adult Health

## 2012-03-07 ENCOUNTER — Encounter (HOSPITAL_COMMUNITY)
Admission: RE | Admit: 2012-03-07 | Discharge: 2012-03-07 | Disposition: A | Discharge status: Home / Self Care | Payer: BC Managed Care – PPO | Source: Ambulatory Visit | Attending: Internal Medicine | Admitting: Internal Medicine

## 2012-03-07 ENCOUNTER — Encounter: Payer: Self-pay | Admitting: Adult Health

## 2012-03-07 VITALS — BP 174/118 | HR 89 | Resp 16 | Ht 78.0 in | Wt 296.0 lb

## 2012-03-07 DIAGNOSIS — R109 Unspecified abdominal pain: Secondary | ICD-10-CM | POA: Insufficient documentation

## 2012-03-07 DIAGNOSIS — I10 Essential (primary) hypertension: Secondary | ICD-10-CM

## 2012-03-07 DIAGNOSIS — I2510 Atherosclerotic heart disease of native coronary artery without angina pectoris: Secondary | ICD-10-CM

## 2012-03-07 MED ORDER — AMLODIPINE BESYLATE 10 MG PO TABS: 10.0000 mg | ORAL_TABLET | Freq: Every day | ORAL | Status: DC

## 2012-03-07 MED ORDER — TECHNETIUM TC 99M MEBROFENIN IV KIT
5.0000 | PACK | Freq: Once | INTRAVENOUS | Status: AC | PRN
  Administered 2012-03-07: 5 via INTRAVENOUS

## 2012-03-07 MED ORDER — POTASSIUM CHLORIDE ER 10 MEQ PO TBCR: EXTENDED_RELEASE_TABLET | ORAL | Status: DC

## 2012-03-07 MED ORDER — SINCALIDE 5 MCG IJ SOLR
INTRAMUSCULAR | Status: AC
  Administered 2012-03-07: 2.55 ug via INTRAVENOUS
  Filled 2012-03-07: qty 5

## 2012-03-07 MED ORDER — FUROSEMIDE 20 MG PO TABS: ORAL_TABLET | ORAL | Status: DC

## 2012-03-07 MED ORDER — PANTOPRAZOLE SODIUM 40 MG PO TBEC: 40.0000 mg | DELAYED_RELEASE_TABLET | Freq: Every day | ORAL | Status: DC

## 2012-03-07 MED ORDER — CLONIDINE HCL 0.1 MG/24HR TD PTWK: 1.0000 | MEDICATED_PATCH | TRANSDERMAL | Status: DC

## 2012-03-07 NOTE — Assessment & Plan Note (Signed)
He is having ongoing heartburn symptoms which is related to hypertension and GB issues, I believe. Will add PPI to medication regimen. If LV fx is substantially decreased will consider repeating cath vs myoview. He is not cleared for GB surgery at this time secondary to uncontrolled BP.

## 2012-03-07 NOTE — Progress Notes (Signed)
   HPI: [name] is 51 y/o patient of Dr. Dietrich Pates, lost to follow-up with known history of CAD, nonobstructive per cath in 2002, hypertension.  He comes today for evaluation prior having cholecystectomy which is yet to be planned He is very hypertensive and has not been taking medications listed.  Review of his medication bottles show only HCTZ.  He has gained 15 lbs in the last two weeks. He has recurrent abdominal pain and has been diagnosed with cholecystis. He admits to noncompliance with dietary restrictions of sodium. He is noticing abdominal distention with some LEE. He has been having ongoing heartburn.  No Known Allergies  Current Outpatient Prescriptions  Medication Sig Dispense Refill  . amLODipine (NORVASC) 10 MG tablet Take 10 mg by mouth daily.        Marland Kitchen aspirin 81 MG tablet Take 81 mg by mouth daily.        . cetirizine (ZYRTEC) 10 MG tablet Take 10 mg by mouth daily.        . cloNIDine (CATAPRES) 0.1 MG tablet Take 0.1 mg by mouth daily.        . hydrochlorothiazide (HYDRODIURIL) 25 MG tablet Take 25 mg by mouth daily.         No current facility-administered medications for this visit.   Facility-Administered Medications Ordered in Other Visits  Medication Dose Route Frequency Provider Last Rate Last Dose  . sincalide [facility]) 5 MCG injection        2.55 mcg at 03/07/12 1020  . technetium TC 67M mebrofenin (CHOLETEC) injection 5 milli Curie  5 milli Curie Intravenous Once PRN Medication Radiologist, MD   5 milli Curie at 03/07/12 0920    Past Medical History  Diagnosis Date  . Palpitations   . CAD (coronary artery disease)   . Hypertension   . Tobacco abuse   . Sleep deprivation     Past Surgical History  Procedure Date  . Cardiac catheterization 2002    ZHY:QMVHQI of systems complete and found to be negative unless listed above PHYSICAL EXAM BP 174/118  Pulse 89  Resp 16  Ht 6\' 6"  (1.981 m)  Wt 296 lb (134.265 kg)  BMI 34.21 kg/m2  General: Well  developed, well nourished, in no acute distress Head: Eyes PERRLA, No xanthomas.   Normal cephalic and atramatic  Lungs: Clear bilaterally to auscultation and percussion. Heart: HRRR S1 S2, with prominent S4. .  Pulses are 2+ & equal.            No carotid bruit. No JVD.  No abdominal bruits. No femoral bruits. Abdomen: Bowel sounds are positive, abdomen soft and non-tender without masses or  Hernia's noted.Mild abdominal distention with positive Murphy's sign. Msk:  Back normal, normal gait. Normal strength and tone for age. Extremities: No clubbing, cyanosis or edema.  DP +1 Neuro: Alert and oriented X 3. Psych:  Good affect, responds appropriately  EKG: SR with poor R-wave progression.  LAE. Rate of 86 bpm.   ASSESSMENT AND PLAN

## 2012-03-07 NOTE — Patient Instructions (Signed)
Your physician recommends that you schedule a follow-up appointment in:  1 - 2 week follow up with the provider 2 - 1 week blood pressure check  Your physician has recommended you make the following change in your medication:  1 - START Aspirin 81 mg daily 2 - START Amlodipine 10 mg daily 3 - START Lasix 20 mg today x 3 days along with potassium supplement 10 meq. 4 - START Protonix 40 mg daily  Your physician has recommended that you have a sleep study. This test records several body functions during sleep, including: brain activity, eye movement, oxygen and carbon dioxide blood levels, heart rate and rhythm, breathing rate and rhythm, the flow of air through your mouth and nose, snoring, body muscle movements, and chest and belly movement.  Your physician has requested that you have an echocardiogram. Echocardiography is a painless test that uses sound waves to create images of your heart. It provides your doctor with information about the size and shape of your heart and how well your heart's chambers and valves are working. This procedure takes approximately one hour. There are no restrictions for this procedure.  Weigh yourself daily and bring a log with you to your follow up visit next week  Your physician recommends that you return for lab work in: TODAY and in 1 WEEK  Low salt diet (See instructions)

## 2012-03-07 NOTE — Assessment & Plan Note (Signed)
Blood pressure is elevated today with medical noncompliance. I have put him back on his amlodipine. . He has evidence of fluid overload and therefore will begin lasix 20 mg daily for 3 days with 10 mEq potassium replacement during diureses. Review of labs shows that his potassium in 4.1 with Creatinine 1.49  Check BNP to add to recent labs. . I will check a BMET and BP in one week. May need to restart clonidine if BP remains elevated. He is to weigh himself daily and avoid salt. Written instructions are provided on low salt diet.  Echocardiogram will be completed as he has not had one in 2 years with systolic dysfunction noted of 47%.

## 2012-03-08 LAB — BRAIN NATRIURETIC PEPTIDE: Brain Natriuretic Peptide: 1030 pg/mL — ABNORMAL HIGH (ref 0.0–100.0)

## 2012-03-11 ENCOUNTER — Other Ambulatory Visit (HOSPITAL_COMMUNITY): Payer: BC Managed Care – PPO

## 2012-03-13 ENCOUNTER — Encounter: Payer: Self-pay | Admitting: *Deleted

## 2012-03-14 ENCOUNTER — Ambulatory Visit (HOSPITAL_COMMUNITY): Admission: RE | Admit: 2012-03-14 | Payer: BC Managed Care – PPO | Source: Ambulatory Visit

## 2012-03-14 ENCOUNTER — Ambulatory Visit (INDEPENDENT_AMBULATORY_CARE_PROVIDER_SITE_OTHER): Payer: BC Managed Care – PPO | Admitting: *Deleted

## 2012-03-14 VITALS — BP 137/93 | HR 89 | Ht 78.0 in | Wt 277.0 lb

## 2012-03-14 DIAGNOSIS — I10 Essential (primary) hypertension: Secondary | ICD-10-CM

## 2012-03-14 NOTE — Progress Notes (Signed)
Presents for a BP check since initiation of HCTZ.  Has lost 8 pounds since 5/24 and states feeling great.  VS wnl and medications reconciled.  No complaints voiced.

## 2012-03-16 ENCOUNTER — Ambulatory Visit: Payer: BC Managed Care – PPO | Attending: Cardiology | Admitting: Sleep Medicine

## 2012-03-16 DIAGNOSIS — G4730 Sleep apnea, unspecified: Secondary | ICD-10-CM

## 2012-03-16 DIAGNOSIS — Z6832 Body mass index (BMI) 32.0-32.9, adult: Secondary | ICD-10-CM | POA: Insufficient documentation

## 2012-03-16 DIAGNOSIS — G4733 Obstructive sleep apnea (adult) (pediatric): Secondary | ICD-10-CM | POA: Insufficient documentation

## 2012-03-16 NOTE — Progress Notes (Signed)
Patient ID: James [name], male   DOB: 12/20/1960, 51 y.o.   MRN: [id number]  Blood pressure is markedly improved.  Patient is symptomatically improved.  Return visit already scheduled for 03/25/2012. CMet is a metabolic profile has not already been ordered.

## 2012-03-20 ENCOUNTER — Telehealth: Payer: Self-pay | Admitting: Cardiology

## 2012-03-20 NOTE — Telephone Encounter (Signed)
Patient needs clearance for Galbladder removal.  Patient was just seen in office last week.  Patient will have General Anesthesia. Procedure not scheduled yet. / tg

## 2012-03-20 NOTE — Telephone Encounter (Signed)
Patient to have follow up on 6/11 after all pre-surgical testing is complete.

## 2012-03-22 NOTE — Procedures (Signed)
NAME:  James [name], James [name]               ACCOUNT NO.:  [id number]  MEDICAL RECORD NO.:  [id number]          PATIENT TYPE:  OUT  LOCATION:  SLEEP LAB                     FACILITY:  [facility]  PHYSICIAN:  Keneshia Tena A. Gerilyn Pilgrim, M.D. DATE OF BIRTH:  1960-11-16  DATE OF STUDY:  03/16/2012                           NOCTURNAL POLYSOMNOGRAM  REFERRING PHYSICIAN:  Bettey Mare. Lyman Bishop, NP  INDICATION:  This 51 year old man who presents with hypersomnia, fatigue, and snoring.  This study is being done to evaluate for obstructive sleep apnea syndrome.  MEDICATIONS:  None.  EPWORTH SLEEPINESS SCALE:  13.  BMI:  32.  ARCHITECTURAL SUMMARY:  The total recording time is 418 minutes.  Sleep efficiency 91%.  Sleep latency 10 minutes.  REM latency 71 minutes. Stage N1 7.2%, N2 57%, N3 8%, and REM sleep 27%  RESPIRATORY SUMMARY:  Baseline oxygen saturation 97, lowest saturation 80.  During REM sleep.  Diagnostic AHI 35 and RDI 37.  LIMB MOVEMENT SUMMARY:  PLM index 0.  ELECTROCARDIOGRAM SUMMARY:  Average heart rate is 76 with some PVCs noted throughout the recording.  IMPRESSION:  Moderate obstructive sleep apnea syndrome.  RECOMMENDATION:  Formal CPAP titration study.  Thanks for this referral.    Brieanna Nau A. Gerilyn Pilgrim, M.D.    KAD/MEDQ  D:  03/22/2012 14:41:11  T:  03/22/2012 14:54:45  Job:  [id number]

## 2012-03-25 ENCOUNTER — Encounter: Payer: Self-pay | Admitting: Adult Health

## 2012-03-25 ENCOUNTER — Ambulatory Visit (INDEPENDENT_AMBULATORY_CARE_PROVIDER_SITE_OTHER): Payer: BC Managed Care – PPO | Admitting: Adult Health

## 2012-03-25 VITALS — BP 147/96 | HR 91 | Ht 78.0 in | Wt 272.0 lb

## 2012-03-25 DIAGNOSIS — I2510 Atherosclerotic heart disease of native coronary artery without angina pectoris: Secondary | ICD-10-CM

## 2012-03-25 DIAGNOSIS — I10 Essential (primary) hypertension: Secondary | ICD-10-CM

## 2012-03-25 NOTE — Assessment & Plan Note (Signed)
Much better controlled with wt loss and diureses. He will be rescheduled for echocardiogram and likely be released to have lapchole per Dr. Lovell Sheehan. He will continue his other antihypertensives as directed.   He was also found to have moderate sleep apnea per sleep study. Wt loss is helpful with this. He is recommended to have CPAP. Will refer to Dr. Juanetta Gosling for settings based upon sleep study report.

## 2012-03-25 NOTE — Assessment & Plan Note (Signed)
No changes or planned testing at this time. He is stable from cardiac standpoint.

## 2012-03-25 NOTE — Progress Notes (Signed)
   HPI: Mr. Allston is a 51 y/o patient of Dr. Dietrich Pates we are seeing for ongoing assessment and treatment of CAD (non-obstructive per cath in 2002), hypertension.Marland Kitchen He is here for discussion of tests completed prior to cholecystectomy. On last visit he was found to be hypertensive with fluid retention. Echo was ordered but he did not have this done. BNP was found to be elevated at 1030. He was placed on lasix 20 mg daily for 3 days with potassium. He also had a sleep study completed.    Since last visit, his blood pressure has improved significantly from 174/118 to 147/96. I suspect he has diastolic dysfunction. He has avoided salt and started eating less. Wt is down from 296 to 272 lbs in 3 weeks.   No Known Allergies  Current Outpatient Prescriptions  Medication Sig Dispense Refill  . amLODipine (NORVASC) 10 MG tablet Take 1 tablet (10 mg total) by mouth daily.  30 tablet  12  . aspirin 81 MG tablet Take 81 mg by mouth daily.        . cetirizine (ZYRTEC) 10 MG tablet Take 10 mg by mouth daily.        . furosemide (LASIX) 20 MG tablet Daily x 3 days only  3 tablet  0  . hydrochlorothiazide (HYDRODIURIL) 25 MG tablet Take 25 mg by mouth daily.        . pantoprazole (PROTONIX) 40 MG tablet Take 1 tablet (40 mg total) by mouth daily.  30 tablet  12  . potassium chloride (K-DUR) 10 MEQ tablet One tablet daily x 3 days with lasix  3 tablet  0    Past Medical History  Diagnosis Date  . Palpitations   . CAD (coronary artery disease)   . Hypertension   . Tobacco abuse   . Sleep deprivation     Past Surgical History  Procedure Date  . Cardiac catheterization 2002    XBM:[id number] of systems complete and found to be negative unless listed above  PHYSICAL EXAM BP 147/96  Pulse 91  Ht 6\' 6"  (1.981 m)  Wt 272 lb (123.378 kg)  BMI 31.43 kg/m2  General: Well developed, well nourished, in no acute distress Head: Eyes PERRLA, No xanthomas.   Normal cephalic and atramatic  Lungs: Clear  bilaterally to auscultation and percussion. Heart: HRRR S1 S2, without MRG.  Pulses are 2+ & equal.            No carotid bruit. No JVD.  No abdominal bruits. No femoral bruits. Abdomen: Bowel sounds are positive, abdomen soft and non-tender without masses or                  Hernia's noted. Msk:  Back normal, normal gait. Normal strength and tone for age. Extremities: No clubbing, cyanosis or edema.  DP +1 Neuro: Alert and oriented X 3. Psych:  Good affect, responds appropriately    ASSESSMENT AND PLAN

## 2012-03-25 NOTE — Patient Instructions (Signed)
**Note De-identified James [name] Obfuscation** Your physician has requested that you have an echocardiogram. Echocardiography is a painless test that uses sound waves to create images of your heart. It provides your doctor with information about the size and shape of your heart and how well your heart's chambers and valves are working. This procedure takes approximately one hour. There are no restrictions for this procedure.  Your physician recommends that you continue on your current medications as directed. Please refer to the Current Medication list given to you today.  Your physician recommends that you schedule a follow-up appointment in: 4 months  

## 2012-03-27 ENCOUNTER — Ambulatory Visit (HOSPITAL_COMMUNITY)
Admission: RE | Admit: 2012-03-27 | Discharge: 2012-03-27 | Disposition: A | Discharge status: Home / Self Care | Payer: BC Managed Care – PPO | Source: Ambulatory Visit | Attending: Adult Health | Admitting: Adult Health

## 2012-03-27 DIAGNOSIS — I2510 Atherosclerotic heart disease of native coronary artery without angina pectoris: Secondary | ICD-10-CM | POA: Insufficient documentation

## 2012-03-27 DIAGNOSIS — I369 Nonrheumatic tricuspid valve disorder, unspecified: Secondary | ICD-10-CM

## 2012-03-27 DIAGNOSIS — I10 Essential (primary) hypertension: Secondary | ICD-10-CM

## 2012-03-27 NOTE — Progress Notes (Signed)
*  PRELIMINARY RESULTS* Echocardiogram 2D Echocardiogram has been performed.  Caswell Corwin 03/27/2012, 3:22 PM

## 2012-03-28 ENCOUNTER — Other Ambulatory Visit: Payer: Self-pay

## 2012-03-28 ENCOUNTER — Telehealth: Payer: Self-pay

## 2012-03-28 DIAGNOSIS — I10 Essential (primary) hypertension: Secondary | ICD-10-CM

## 2012-03-28 MED ORDER — FUROSEMIDE 20 MG PO TABS: 20.0000 mg | ORAL_TABLET | ORAL | Status: DC

## 2012-03-28 NOTE — Telephone Encounter (Signed)
**Note De-Identified Oakley Kossman Obfuscation** Pt. advised, he verbalized understanding. Lab order faxed to [facility] and RX sent to [facility] in [place] to fill. Appt. scheduled for June 20 at 2:00 with Joni Reining, NP, pt. aware./LV

## 2012-03-28 NOTE — Telephone Encounter (Signed)
**Note De-Identified Eastyn Skalla Obfuscation** Message copied by Demetrios Loll on Fri Mar 28, 2012 10:23 AM ------      Message from: Jodelle Gross      Created: Fri Mar 28, 2012  9:30 AM       Call him and let him know that his echo was abnormal. We cannot clear him for surgery until we get his medications tightened up. He will need to be on lasix daily, 20mg . He will need BMET on Monday and follow up appointment next week for discussion.

## 2012-03-31 ENCOUNTER — Telehealth: Payer: Self-pay

## 2012-03-31 MED ORDER — CARVEDILOL 6.25 MG PO TABS: 6.2500 mg | ORAL_TABLET | Freq: Two times a day (BID) | ORAL | Status: DC

## 2012-03-31 MED ORDER — LISINOPRIL 2.5 MG PO TABS: 2.5000 mg | ORAL_TABLET | Freq: Every day | ORAL | Status: DC

## 2012-03-31 NOTE — Telephone Encounter (Signed)
**Note De-Identified Cayle Thunder Obfuscation** Message copied by Demetrios Loll on Mon Mar 31, 2012  8:26 AM ------      Message from: Jodelle Gross      Created: Mon Mar 31, 2012  8:18 AM       Please begin this patient on Coreg 6.25 mg BID, Lisinopril 2.5 mg daily, and stop amlodipine. Due to low EF, will need medication changes. He is coming in on Thursday, but will need to be placed on these new medications now.            Samara Deist

## 2012-03-31 NOTE — Telephone Encounter (Signed)
**Note De-Identified James [name] Obfuscation** Pt. Advised, he verbalized understanding.  [facility] sent to [facility] to fill/LV

## 2012-04-03 ENCOUNTER — Encounter: Payer: Self-pay | Admitting: Adult Health

## 2012-04-03 ENCOUNTER — Ambulatory Visit (INDEPENDENT_AMBULATORY_CARE_PROVIDER_SITE_OTHER): Payer: BC Managed Care – PPO | Admitting: Adult Health

## 2012-04-03 VITALS — BP 150/102 | HR 85 | Resp 18 | Ht 78.0 in | Wt 280.0 lb

## 2012-04-03 DIAGNOSIS — I2510 Atherosclerotic heart disease of native coronary artery without angina pectoris: Secondary | ICD-10-CM

## 2012-04-03 DIAGNOSIS — I5022 Chronic systolic (congestive) heart failure: Secondary | ICD-10-CM

## 2012-04-03 DIAGNOSIS — Z01818 Encounter for other preprocedural examination: Secondary | ICD-10-CM

## 2012-04-03 DIAGNOSIS — I10 Essential (primary) hypertension: Secondary | ICD-10-CM

## 2012-04-03 DIAGNOSIS — Z7901 Long term (current) use of anticoagulants: Secondary | ICD-10-CM

## 2012-04-03 DIAGNOSIS — I5020 Unspecified systolic (congestive) heart failure: Secondary | ICD-10-CM

## 2012-04-03 DIAGNOSIS — I428 Other cardiomyopathies: Secondary | ICD-10-CM | POA: Insufficient documentation

## 2012-04-03 LAB — CBC WITH DIFFERENTIAL/PLATELET
Basophils Absolute: 0 10*3/uL (ref 0.0–0.1)
HCT: 49.3 % (ref 39.0–52.0)
Lymphocytes Relative: 29 % (ref 12–46)
Lymphs Abs: 2.5 10*3/uL (ref 0.7–4.0)
MCV: 89 fL (ref 78.0–100.0)
Neutro Abs: 5.4 10*3/uL (ref 1.7–7.7)
Platelets: 187 10*3/uL (ref 150–400)
RBC: 5.54 MIL/uL (ref 4.22–5.81)
RDW: 14.1 % (ref 11.5–15.5)
WBC: 8.6 10*3/uL (ref 4.0–10.5)

## 2012-04-03 MED ORDER — FUROSEMIDE 40 MG PO TABS: 40.0000 mg | ORAL_TABLET | ORAL | Status: DC

## 2012-04-03 MED ORDER — LISINOPRIL 5 MG PO TABS: 5.0000 mg | ORAL_TABLET | Freq: Every day | ORAL | Status: DC

## 2012-04-03 NOTE — Patient Instructions (Addendum)
**Note De-Identified James [name] Obfuscation** Your physician has recommended you make the following change in your medication: increase Lasix to 40 mg daily and increase Lisinopril to 5 mg daily  Your physician has requested that you have a cardiac catheterization. Cardiac catheterization is used to diagnose and/or treat various heart conditions. Doctors may recommend this procedure for a number of different reasons. The most common reason is to evaluate chest pain. Chest pain can be a symptom of coronary artery disease (CAD), and cardiac catheterization can show whether plaque is narrowing or blocking your heart's arteries. This procedure is also used to evaluate the valves, as well as measure the blood flow and oxygen levels in different parts of your heart. For further information please visit [email or web address] Please follow instruction sheet, as given.  Your physician recommends that you return for lab work in: today  Your physician recommends that you schedule a follow-up appointment in: after cath

## 2012-04-03 NOTE — Assessment & Plan Note (Signed)
I have increased his lasix to 40 mg daily and increased lisinopril to 5 mg daily in the setting of hypertension and CHF. This may impact labs for cardiac cath. However, this will be evaluated per cathing physician with limited dye load if necessary. He is advised that if he begins to feel worsening symptoms he is to go to [facility] hospital for admission, with cardiac cath planned for Monday, June 24th.

## 2012-04-03 NOTE — Progress Notes (Signed)
 HPI: Mr. Ou is a 51 y/o patient of Dr. Rothbart we are seeing for ongoing assessment and treatment of CAD (non-obstructive per cath in 2002), hypertension.. He is here for discussion of tests completed prior to cholecystectomy. On last visit he was found to be hypertensive with fluid retention. Echo was ordered but he did not have this done. BNP was found to be elevated at 1030. He was placed on lasix 20 mg daily for 3 days with potassium.  Wt went  down from 296 to 272 lbs in 3 weeks. I ordered an echo but he did not complete this until last week.     Echo dated 03/27/2012 was found to be significantly abnormal with EF of 20%, severe global hypokinesis with virtual akinesis of the apex and inferior septal wall. He has gained 8 lbs since being seen last week, despite him being on lasix 20 mg daily. He is on ACE and BB. He states that he is feeling much more tired than usual and beginning to have some shortness of breath again.   No Known Allergies  Current Outpatient Prescriptions  Medication Sig Dispense Refill  . aspirin 81 MG tablet Take 81 mg by mouth daily.        . carvedilol (COREG) 6.25 MG tablet Take 1 tablet (6.25 mg total) by mouth 2 (two) times daily.  60 tablet  6  . cetirizine (ZYRTEC) 10 MG tablet Take 10 mg by mouth daily.        . furosemide (LASIX) 40 MG tablet Take 1 tablet (40 mg total) by mouth every morning.  30 tablet  3  . hydrochlorothiazide (HYDRODIURIL) 25 MG tablet Take 25 mg by mouth daily.        . ibuprofen (ADVIL,MOTRIN) 200 MG tablet Take 200 mg by mouth every 6 (six) hours as needed.      . pantoprazole (PROTONIX) 40 MG tablet Take 1 tablet (40 mg total) by mouth daily.  30 tablet  12  . DISCONTD: lisinopril (PRINIVIL,ZESTRIL) 2.5 MG tablet Take 1 tablet (2.5 mg total) by mouth daily.  30 tablet  6  . lisinopril (PRINIVIL,ZESTRIL) 5 MG tablet Take 1 tablet (5 mg total) by mouth daily.  30 tablet  3    Past Medical History  Diagnosis Date  . Palpitations     . CAD (coronary artery disease)   . Hypertension   . Tobacco abuse   . Sleep deprivation     Past Surgical History  Procedure Date  . Cardiac catheterization 2002    ROS:Review of systems complete and found to be negative unless listed above  PHYSICAL EXAM BP 150/102  Pulse 85  Resp 18  Ht 6' 6" (1.981 m)  Wt 280 lb (127.007 kg)  BMI 32.36 kg/m2  General: Well developed, well nourished, in no acute distress Head: Eyes PERRLA, No xanthomas.   Normal cephalic and atramatic  Lungs: Mild bibasilar crackles. No wheezes or cough. Heart: HRRR S1 S2, No MRG.  Pulses are 2+ & equal.            No carotid bruit. No JVD at 12 cm..  No abdominal bruits. No femoral bruits. Abdomen: Bowel sounds are positive, abdomen soft and non-tender without masses or                  Hernia's noted. Msk:  Back normal, normal gait. Normal strength and tone for age. Extremities: No clubbing, cyanosis,1+ edema.  DP +1 Neuro: Alert and oriented X   3. Psych:  Good affect, responds appropriately    ASSESSMENT AND PLAN 

## 2012-04-03 NOTE — Assessment & Plan Note (Signed)
Cardiac cath in 2002 demonstrated nonobstructive disease at that time.Left main is normal.See results below  Left anterior descending artery has minor luminal irregularities in the mid vessel.  The LAD gives rise to two normal size diagonal branches.  Left circumflex has a 40 to 50% stenosis in the distal vessel beyond a large OM-2.  The vessel is relatively small at this point.  The circumflex gives rise to a small OM-1, large OM-2, and small third and fourth marginal branches.  Right coronary artery has a 20% stenosis in the mid vessel.  It gives rise to a normal size posterior descending artery and normal size posterolateral branch.  IMPRESSIONS: 1. Normal left ventricular systolic function. 2. No significant coronary artery disease identified.  He had normal systolic function per echo in 4098. There has been a significant decrease in LV fx per echo completed one week ago. With history of CAD above, although non obstructive at that time, he most likely has progression of CAD. I have discussed this with the patient and with Dr. Dietrich Pates. We will plan for cardiac catheterization for evaluation of coronary anatomy. I discussed risks and benefits with the patient to include, bleeding, MI and death. He verbalizes understanding and is willing to proceed. I have prepared him that he may need intervention.

## 2012-04-04 LAB — PROTIME-INR: INR: 1 (ref <1.50)

## 2012-04-04 LAB — BASIC METABOLIC PANEL
BUN: 23 mg/dL (ref 6–23)
Calcium: 9.4 mg/dL (ref 8.4–10.5)
Glucose, Bld: 106 mg/dL — ABNORMAL HIGH (ref 70–99)
Potassium: 3.9 mEq/L (ref 3.5–5.3)
Sodium: 140 mEq/L (ref 135–145)

## 2012-04-04 LAB — APTT: aPTT: 37 seconds (ref 24–37)

## 2012-04-07 ENCOUNTER — Inpatient Hospital Stay (HOSPITAL_BASED_OUTPATIENT_CLINIC_OR_DEPARTMENT_OTHER)
Admission: RE | Admit: 2012-04-07 | Discharge: 2012-04-07 | Disposition: A | Discharge status: Home / Self Care | Payer: BC Managed Care – PPO | Source: Ambulatory Visit | Attending: Cardiology | Admitting: Cardiology

## 2012-04-07 ENCOUNTER — Encounter (HOSPITAL_BASED_OUTPATIENT_CLINIC_OR_DEPARTMENT_OTHER)
Admission: RE | Disposition: A | Discharge status: Home / Self Care | Payer: Self-pay | Source: Ambulatory Visit | Attending: Cardiology

## 2012-04-07 DIAGNOSIS — F17200 Nicotine dependence, unspecified, uncomplicated: Secondary | ICD-10-CM | POA: Insufficient documentation

## 2012-04-07 DIAGNOSIS — I428 Other cardiomyopathies: Secondary | ICD-10-CM

## 2012-04-07 DIAGNOSIS — I10 Essential (primary) hypertension: Secondary | ICD-10-CM | POA: Insufficient documentation

## 2012-04-07 DIAGNOSIS — Z7901 Long term (current) use of anticoagulants: Secondary | ICD-10-CM

## 2012-04-07 DIAGNOSIS — Z01818 Encounter for other preprocedural examination: Secondary | ICD-10-CM

## 2012-04-07 DIAGNOSIS — I2510 Atherosclerotic heart disease of native coronary artery without angina pectoris: Secondary | ICD-10-CM | POA: Insufficient documentation

## 2012-04-07 DIAGNOSIS — I5022 Chronic systolic (congestive) heart failure: Secondary | ICD-10-CM

## 2012-04-07 SURGERY — JV LEFT HEART CATHETERIZATION WITH CORONARY ANGIOGRAM
Anesthesia: Moderate Sedation

## 2012-04-07 MED ORDER — ASPIRIN 81 MG PO CHEW
324.0000 mg | CHEWABLE_TABLET | ORAL | Status: AC
  Administered 2012-04-07: 324 mg via ORAL

## 2012-04-07 MED ORDER — SODIUM CHLORIDE 0.9 % IV SOLN: 250.0000 mL | INTRAVENOUS | Status: DC | PRN

## 2012-04-07 MED ORDER — ACETAMINOPHEN 325 MG PO TABS: 650.0000 mg | ORAL_TABLET | ORAL | Status: DC | PRN

## 2012-04-07 MED ORDER — SODIUM CHLORIDE 0.9 % IJ SOLN: 3.0000 mL | Freq: Two times a day (BID) | INTRAMUSCULAR | Status: DC

## 2012-04-07 MED ORDER — SODIUM CHLORIDE 0.9 % IV SOLN: INTRAVENOUS | Status: DC

## 2012-04-07 MED ORDER — SODIUM CHLORIDE 0.9 % IJ SOLN: 3.0000 mL | INTRAMUSCULAR | Status: DC | PRN

## 2012-04-07 MED ORDER — SODIUM CHLORIDE 0.9 % IV SOLN
INTRAVENOUS | Status: DC
  Administered 2012-04-07: 09:00:00 via INTRAVENOUS

## 2012-04-07 MED ORDER — ONDANSETRON HCL 4 MG/2ML IJ SOLN: 4.0000 mg | Freq: Four times a day (QID) | INTRAMUSCULAR | Status: DC | PRN

## 2012-04-07 NOTE — Addendum Note (Signed)
**Note De-Identified Emmy Keng Obfuscation** Addended by: Demetrios Loll on: 04/07/2012 03:19 PM   Modules accepted: Orders

## 2012-04-07 NOTE — Progress Notes (Signed)
Bedrest begins @ 1030.  Tegaderm dressing applied to right groin site by Venda Rodes.  Right groin site level 0.

## 2012-04-07 NOTE — H&P (View-Only) (Signed)
HPI: Mr. Routh is a 51 y/o patient of Dr. Dietrich Pates we are seeing for ongoing assessment and treatment of CAD (non-obstructive per cath in 2002), hypertension.Marland Kitchen He is here for discussion of tests completed prior to cholecystectomy. On last visit he was found to be hypertensive with fluid retention. Echo was ordered but he did not have this done. BNP was found to be elevated at 1030. He was placed on lasix 20 mg daily for 3 days with potassium.  Wt went  down from 296 to 272 lbs in 3 weeks. I ordered an echo but he did not complete this until last week.     Echo dated 03/27/2012 was found to be significantly abnormal with EF of 20%, severe global hypokinesis with virtual akinesis of the apex and inferior septal wall. He has gained 8 lbs since being seen last week, despite him being on lasix 20 mg daily. He is on ACE and BB. He states that he is feeling much more tired than usual and beginning to have some shortness of breath again.   No Known Allergies  Current Outpatient Prescriptions  Medication Sig Dispense Refill  . aspirin 81 MG tablet Take 81 mg by mouth daily.        . carvedilol (COREG) 6.25 MG tablet Take 1 tablet (6.25 mg total) by mouth 2 (two) times daily.  60 tablet  6  . cetirizine (ZYRTEC) 10 MG tablet Take 10 mg by mouth daily.        . furosemide (LASIX) 40 MG tablet Take 1 tablet (40 mg total) by mouth every morning.  30 tablet  3  . hydrochlorothiazide (HYDRODIURIL) 25 MG tablet Take 25 mg by mouth daily.        Marland Kitchen ibuprofen (ADVIL,MOTRIN) 200 MG tablet Take 200 mg by mouth every 6 (six) hours as needed.      . pantoprazole (PROTONIX) 40 MG tablet Take 1 tablet (40 mg total) by mouth daily.  30 tablet  12  . DISCONTD: lisinopril (PRINIVIL,ZESTRIL) 2.5 MG tablet Take 1 tablet (2.5 mg total) by mouth daily.  30 tablet  6  . lisinopril (PRINIVIL,ZESTRIL) 5 MG tablet Take 1 tablet (5 mg total) by mouth daily.  30 tablet  3    Past Medical History  Diagnosis Date  . Palpitations     . CAD (coronary artery disease)   . Hypertension   . Tobacco abuse   . Sleep deprivation     Past Surgical History  Procedure Date  . Cardiac catheterization 2002    ZOX:WRUEAV of systems complete and found to be negative unless listed above  PHYSICAL EXAM BP 150/102  Pulse 85  Resp 18  Ht 6\' 6"  (1.981 m)  Wt 280 lb (127.007 kg)  BMI 32.36 kg/m2  General: Well developed, well nourished, in no acute distress Head: Eyes PERRLA, No xanthomas.   Normal cephalic and atramatic  Lungs: Mild bibasilar crackles. No wheezes or cough. Heart: HRRR S1 S2, No MRG.  Pulses are 2+ & equal.            No carotid bruit. No JVD at 12 cm..  No abdominal bruits. No femoral bruits. Abdomen: Bowel sounds are positive, abdomen soft and non-tender without masses or                  Hernia's noted. Msk:  Back normal, normal gait. Normal strength and tone for age. Extremities: No clubbing, cyanosis,1+ edema.  DP +1 Neuro: Alert and oriented X  3. Psych:  Good affect, responds appropriately    ASSESSMENT AND PLAN

## 2012-04-07 NOTE — CV Procedure (Signed)
   Cardiac Catheterization Procedure Note  Name: James [name] MRN: [id number] DOB: 05/29/1961  Procedure: Left Heart Cath, Selective Coronary Angiography, LV angiography  Indication: 51 year old black male with newly diagnosed cardiomyopathy, ejection fraction 20%.   Procedural details: The right groin was prepped, draped, and anesthetized with 1% lidocaine. Using modified Seldinger technique, a 4 French sheath was introduced into the right femoral artery. Standard Judkins catheters were used for coronary angiography and left ventriculography. Catheter exchanges were performed over a guidewire. There were no immediate procedural complications. The patient was transferred to the post catheterization recovery area for further monitoring. 85 cc of Omnipaque.  Procedural Findings: Hemodynamics:  AO 145/93 with a mean of 115 mmHg LV 141/41 mmHg   Coronary angiography: Coronary dominance: right  Left mainstem: Normal  Left anterior descending (LAD): Normal  Left circumflex (LCx): Normal  Right coronary artery (RCA): Mild disease in the proximal vessel up to 20%.  Left ventriculography: Left ventricular systolic function is markedly abnormal. There is severe global hypokinesis with ejection fraction estimated at 20%.  Final Conclusions:   1. Minimal nonobstructive coronary disease. 2. Severe left ventricular dysfunction. 3. Markedly elevated left ventricular filling pressures.  Recommendations: Continue to optimize medical management.  Theron Arista [facility] 04/07/2012, 10:10 AM

## 2012-04-07 NOTE — Interval H&P Note (Signed)
History and Physical Interval Note:  04/07/2012 9:46 AM  James [name]  has presented today for surgery, with the diagnosis of abnormal stress test  The various methods of treatment have been discussed with the patient and family. After consideration of risks, benefits and other options for treatment, the patient has consented to  Procedure(s) (LRB): JV LEFT HEART CATHETERIZATION WITH CORONARY ANGIOGRAM (N/A) as a surgical intervention .  The patient's history has been reviewed, patient examined, no change in status, stable for surgery.  I have reviewed the patients' chart and labs.  Questions were answered to the patient's satisfaction.     Theron Arista [facility] 04/07/2012 9:47 AM

## 2012-04-14 ENCOUNTER — Telehealth: Payer: Self-pay | Admitting: Adult Health

## 2012-04-14 NOTE — Telephone Encounter (Signed)
**Note De-Identified Dese[place] Bordley Obfuscation** Pt. had cath on 6-24 and wants to know when he can return to work. Please advise./LV

## 2012-04-14 NOTE — Telephone Encounter (Signed)
Patient wants to know when he can return to work/tg

## 2012-04-14 NOTE — Telephone Encounter (Signed)
Pt called back again, he is due to go back to work tonight. Spoke with [employer] and she has advised that he statu out till he is seen in follow up. I did let the patient know that his next appt is for 04/28/12 and if we needed to move it up to next week we might be able to get him ion at the [address] location.

## 2012-04-28 ENCOUNTER — Encounter: Payer: Self-pay | Admitting: Adult Health

## 2012-04-28 ENCOUNTER — Ambulatory Visit (INDEPENDENT_AMBULATORY_CARE_PROVIDER_SITE_OTHER): Payer: BC Managed Care – PPO | Admitting: Adult Health

## 2012-04-28 VITALS — BP 137/85 | HR 72 | Ht 78.0 in | Wt 278.0 lb

## 2012-04-28 DIAGNOSIS — G4730 Sleep apnea, unspecified: Secondary | ICD-10-CM

## 2012-04-28 DIAGNOSIS — G4733 Obstructive sleep apnea (adult) (pediatric): Secondary | ICD-10-CM | POA: Insufficient documentation

## 2012-04-28 DIAGNOSIS — I5020 Unspecified systolic (congestive) heart failure: Secondary | ICD-10-CM

## 2012-04-28 DIAGNOSIS — I2510 Atherosclerotic heart disease of native coronary artery without angina pectoris: Secondary | ICD-10-CM

## 2012-04-28 DIAGNOSIS — I5022 Chronic systolic (congestive) heart failure: Secondary | ICD-10-CM

## 2012-04-28 MED ORDER — HYDRALAZINE HCL 10 MG PO TABS: 10.0000 mg | ORAL_TABLET | Freq: Three times a day (TID) | ORAL | Status: DC

## 2012-04-28 MED ORDER — CARVEDILOL 12.5 MG PO TABS: 12.5000 mg | ORAL_TABLET | Freq: Two times a day (BID) | ORAL | Status: DC

## 2012-04-28 NOTE — Assessment & Plan Note (Signed)
He will be referred to Dr. Juanetta Gosling for CPAP institution and management. Results have been explained to the patient who verbalizes understanding.

## 2012-04-28 NOTE — Progress Notes (Signed)
HPI: James [name] is a 51 y/o patient of Dr. Dietrich Pates we are seeing for ongoing assessment and treatment of CAD (non-obstructive per cath in 2002), hypertension.Marland Kitchen He is here for discussion of tests completed prior to cholecystectomy. On last visit he was found to be hypertensive with fluid retention. Echo was ordered but he did not have this done. BNP was found to be elevated at 1030. He was placed on lasix 20 mg daily for 3 days with potassium.  Wt went  down from 296 to 272 lbs in 3 weeks. I ordered an echo but he did not complete this until last week.     Echo dated 03/27/2012 was found to be significantly abnormal with EF of 20%, severe global hypokinesis with virtual akinesis of the apex and inferior septal wall.  and BB. He stated that he is feeling much more tired than usual and beginning to have some shortness of breath again. As a result, I sent him for cardiac cath. He is cath showed Minimal nonobstructive coronary disease. Severe left ventricular dysfunction.  Markedly elevated left ventricular filling pressures. He also was sent for Sleep study that was positive for moderate sleep apnea.   He comes today still feeling tired, with NYHA Class II symptoms of dyspnea.  He denies chest pain or dizziness. Dry wt appears to be 272, and today's weight is up 6 lbs. He is still not working secondary to his symptoms.    No Known Allergies  Current Outpatient Prescriptions  Medication Sig Dispense Refill  . aspirin 81 MG tablet Take 81 mg by mouth daily.        . carvedilol (COREG) 12.5 MG tablet Take 1 tablet (12.5 mg total) by mouth 2 (two) times daily.  60 tablet  3  . furosemide (LASIX) 40 MG tablet Take 1 tablet (40 mg total) by mouth every morning.  30 tablet  3  . hydrochlorothiazide (HYDRODIURIL) 25 MG tablet Take 25 mg by mouth daily.        Marland Kitchen ibuprofen (ADVIL,MOTRIN) 200 MG tablet Take 200 mg by mouth every 6 (six) hours as needed.      Marland Kitchen lisinopril (PRINIVIL,ZESTRIL) 5 MG tablet Take 1  tablet (5 mg total) by mouth daily.  30 tablet  3  . loratadine (CLARITIN) 10 MG tablet Take 10 mg by mouth daily.      . pantoprazole (PROTONIX) 40 MG tablet Take 1 tablet (40 mg total) by mouth daily.  30 tablet  12  . DISCONTD: carvedilol (COREG) 6.25 MG tablet Take 1 tablet (6.25 mg total) by mouth 2 (two) times daily.  60 tablet  6  . hydrALAZINE (APRESOLINE) 10 MG tablet Take 1 tablet (10 mg total) by mouth 3 (three) times daily.  90 tablet  3    Past Medical History  Diagnosis Date  . Palpitations   . CAD (coronary artery disease)   . Hypertension   . Tobacco abuse   . Sleep deprivation     Past Surgical History  Procedure Date  . Cardiac catheterization 2002    ZOX:WRUEAV of systems complete and found to be negative unless listed above  PHYSICAL EXAM BP 137/85  Pulse 72  Ht 6\' 6"  (1.981 m)  Wt 278 lb (126.1 kg)  BMI 32.13 kg/m2  General: Well developed, well nourished, in no acute distress Head: Eyes PERRLA, No xanthomas.   Normal cephalic and atramatic  Lungs: Mild bibasilar crackles. No wheezes or cough. Heart: HRRR S1 S2, No MRG.  Pulses are 2+ & equal.            No carotid bruit. No JVD at 12 cm..  No abdominal bruits. No femoral bruits. Abdomen: Bowel sounds are positive, abdomen soft and non-tender without masses or                  Hernia's noted. Msk:  Back normal, normal gait. Normal strength and tone for age. Extremities: No clubbing, cyanosis,1+ edema.  DP +1 Neuro: Alert and oriented X 3. Psych:  Good affect, responds appropriately    ASSESSMENT AND PLAN

## 2012-04-28 NOTE — Assessment & Plan Note (Signed)
Will increase coreg to 12.5 mg BID. Add hydralazine 10 mg BID. I have explained to him that we need to keep his BP down much lower than that of a normal BP.He may feel a little tired with addition of hydralazine and increased coreg. He is on ACE inhibitor and may plan to add nitrate at later date. Can consider spironolactone, but will check labs first.

## 2012-04-28 NOTE — Patient Instructions (Addendum)
Your physician has recommended you make the following change in your medication: start taking Hydralazine 10 mg three times daily and increase Coreg to 12.5 mg twice daily.  Your physician recommends that you return for lab work in: 1 week from today  Your physician recommends that you schedule a follow-up appointment in: 2 weeks

## 2012-05-06 ENCOUNTER — Telehealth: Payer: Self-pay | Admitting: Cardiology

## 2012-05-06 NOTE — Telephone Encounter (Signed)
Medications clarified at this time.  Pt verbalized understanding.

## 2012-05-06 NOTE — Telephone Encounter (Signed)
Patient has questions about his medications. / tg

## 2012-05-12 ENCOUNTER — Encounter: Payer: Self-pay | Admitting: *Deleted

## 2012-05-12 ENCOUNTER — Encounter: Payer: Self-pay | Admitting: Adult Health

## 2012-05-12 ENCOUNTER — Ambulatory Visit (INDEPENDENT_AMBULATORY_CARE_PROVIDER_SITE_OTHER): Payer: BC Managed Care – PPO | Admitting: Adult Health

## 2012-05-12 VITALS — BP 147/88 | HR 68 | Ht 78.0 in | Wt 282.0 lb

## 2012-05-12 DIAGNOSIS — G4730 Sleep apnea, unspecified: Secondary | ICD-10-CM

## 2012-05-12 DIAGNOSIS — I5020 Unspecified systolic (congestive) heart failure: Secondary | ICD-10-CM

## 2012-05-12 DIAGNOSIS — I10 Essential (primary) hypertension: Secondary | ICD-10-CM

## 2012-05-12 MED ORDER — LISINOPRIL 20 MG PO TABS: 20.0000 mg | ORAL_TABLET | Freq: Every day | ORAL | Status: DC

## 2012-05-12 MED ORDER — CARVEDILOL 25 MG PO TABS: 25.0000 mg | ORAL_TABLET | Freq: Two times a day (BID) | ORAL | Status: DC

## 2012-05-12 NOTE — Assessment & Plan Note (Signed)
Medications adjusted as discussed.

## 2012-05-12 NOTE — Assessment & Plan Note (Signed)
He has gained 10 pounds since being seen last, he has also elevated with blood pressure despite medications. After frequent questioning about his diet, he admits to drinking a lot of cola's and sweet tea at home. He has not yet returned to work because of his symptoms. I have gone over this case with Dr. Dietrich Pates who has suggested we maximized the Coreg to 25 mg BID and go up on the lisinopril to 20 mg daily I have explained this to him. He will followup in one week for blood pressure check. If blood pressure is better controlled I see no reason for him to stay out of work. We will repeat his echocardiogram in 6 weeks. If no improvement in LVEF we will refer to Dr. Ladona Ridgel for AICD. He will see Dr. Dietrich Pates on the next visit

## 2012-05-12 NOTE — Patient Instructions (Signed)
Your physician recommends that you schedule a follow-up appointment in: 2 weeks with Dr. Dietrich Pates.  Your physician has recommended you make the following change in your medication:  Increase Lisinopril to 20mg  daily Increase Carvedilol to 25mg  1 tablet twice a day

## 2012-05-12 NOTE — Progress Notes (Signed)
HPI: Mr. Hammerschmidt is a 51 y/o patient of Dr. Dietrich Pates we are seeing for ongoing assessment and treatment of CAD (non-obstructive per cath in 2002), hypertension.      Echo dated 03/27/2012 was found to be significantly abnormal with EF of 20%, severe global hypokinesis with virtual akinesis of the apex and inferior septal wall.  and BB. He stated that he is feeling much more tired than usual and beginning to have some shortness of breath again. As a result, I sent him for cardiac cath. He is cath showed Minimal nonobstructive coronary disease. Severe left ventricular dysfunction.  Markedly elevated left ventricular filling pressures. He also was sent for Sleep study that was positive for moderate sleep apnea.   He comes today still feeling tired, with NYHA Class II symptoms of dyspnea.  He denies chest pain or dizziness. Dry wt appears to be 272, and today's weight is up 6 lbs. He is still not working secondary to his symptoms.    On last visit I titrated up his medications and add hydralazine. The patient is here for followup. He states he has been walking several times a week 1 mile a day. He has no symptoms of shortness of breath dyspnea on exertion or fluid retention.   No Known Allergies  Current Outpatient Prescriptions  Medication Sig Dispense Refill  . aspirin 81 MG tablet Take 81 mg by mouth daily.        . carvedilol (COREG) 25 MG tablet Take 1 tablet (25 mg total) by mouth 2 (two) times daily.  60 tablet  6  . furosemide (LASIX) 40 MG tablet Take 1 tablet (40 mg total) by mouth every morning.  30 tablet  3  . hydrALAZINE (APRESOLINE) 10 MG tablet Take 1 tablet (10 mg total) by mouth 3 (three) times daily.  90 tablet  3  . hydrochlorothiazide (HYDRODIURIL) 25 MG tablet Take 25 mg by mouth daily.        Marland Kitchen ibuprofen (ADVIL,MOTRIN) 200 MG tablet Take 200 mg by mouth every 6 (six) hours as needed.      Marland Kitchen lisinopril (PRINIVIL,ZESTRIL) 20 MG tablet Take 1 tablet (20 mg total) by mouth daily.  30  tablet  6  . loratadine (CLARITIN) 10 MG tablet Take 10 mg by mouth daily.      . pantoprazole (PROTONIX) 40 MG tablet Take 1 tablet (40 mg total) by mouth daily.  30 tablet  12  . DISCONTD: carvedilol (COREG) 12.5 MG tablet Take 1 tablet (12.5 mg total) by mouth 2 (two) times daily.  60 tablet  3  . DISCONTD: lisinopril (PRINIVIL,ZESTRIL) 5 MG tablet Take 1 tablet (5 mg total) by mouth daily.  30 tablet  3    Past Medical History  Diagnosis Date  . Palpitations   . CAD (coronary artery disease)   . Hypertension   . Tobacco abuse   . Sleep deprivation     Past Surgical History  Procedure Date  . Cardiac catheterization 2002    ZOX:WRUEAV of systems complete and found to be negative unless listed above  PHYSICAL EXAM BP 147/88  Pulse 68  Ht 6\' 6"  (1.981 m)  Wt 282 lb (127.914 kg)  BMI 32.59 kg/m2  General: Well developed, well nourished, in no acute distress Head: Eyes PERRLA, No xanthomas.   Normal cephalic and atramatic  Lungs: Mild bibasilar crackles. No wheezes or cough. Heart: HRRR S1 S2, No MRG.  Pulses are 2+ & equal.  No carotid bruit. No JVD at 12 cm..  No abdominal bruits. No femoral bruits. Abdomen: Bowel sounds are positive, abdomen soft and non-tender without masses or                  Hernia's noted. Msk:  Back normal, normal gait. Normal strength and tone for age. Extremities: No clubbing, cyanosis,1+ edema.  DP +1 Neuro: Alert and oriented X 3. Psych:  Good affect, responds appropriately    ASSESSMENT AND PLAN

## 2012-05-12 NOTE — Assessment & Plan Note (Signed)
He is to see Dr. Juanetta Gosling next week to discuss CPAP. He was positive on his sleep study for sleep apnea.

## 2012-05-16 ENCOUNTER — Ambulatory Visit (INDEPENDENT_AMBULATORY_CARE_PROVIDER_SITE_OTHER): Payer: BC Managed Care – PPO

## 2012-05-16 ENCOUNTER — Encounter: Payer: Self-pay | Admitting: *Deleted

## 2012-05-16 DIAGNOSIS — R0989 Other specified symptoms and signs involving the circulatory and respiratory systems: Secondary | ICD-10-CM

## 2012-05-29 ENCOUNTER — Encounter: Payer: Self-pay | Admitting: *Deleted

## 2012-05-30 ENCOUNTER — Encounter: Payer: Self-pay | Admitting: Cardiology

## 2012-05-30 ENCOUNTER — Ambulatory Visit (INDEPENDENT_AMBULATORY_CARE_PROVIDER_SITE_OTHER): Payer: BC Managed Care – PPO | Admitting: Cardiology

## 2012-05-30 ENCOUNTER — Encounter: Payer: Self-pay | Admitting: *Deleted

## 2012-05-30 VITALS — BP 127/79 | HR 57 | Ht 78.0 in | Wt 275.0 lb

## 2012-05-30 DIAGNOSIS — G4730 Sleep apnea, unspecified: Secondary | ICD-10-CM

## 2012-05-30 DIAGNOSIS — I10 Essential (primary) hypertension: Secondary | ICD-10-CM

## 2012-05-30 DIAGNOSIS — I429 Cardiomyopathy, unspecified: Secondary | ICD-10-CM

## 2012-05-30 DIAGNOSIS — I5020 Unspecified systolic (congestive) heart failure: Secondary | ICD-10-CM

## 2012-05-30 DIAGNOSIS — I2510 Atherosclerotic heart disease of native coronary artery without angina pectoris: Secondary | ICD-10-CM

## 2012-05-30 DIAGNOSIS — K8020 Calculus of gallbladder without cholecystitis without obstruction: Secondary | ICD-10-CM

## 2012-05-30 DIAGNOSIS — I428 Other cardiomyopathies: Secondary | ICD-10-CM

## 2012-05-30 MED ORDER — HYDRALAZINE HCL 50 MG PO TABS: 50.0000 mg | ORAL_TABLET | Freq: Three times a day (TID) | ORAL | Status: DC

## 2012-05-30 MED ORDER — SPIRONOLACTONE 25 MG PO TABS: 25.0000 mg | ORAL_TABLET | Freq: Every day | ORAL | Status: DC

## 2012-05-30 MED ORDER — ISOSORBIDE MONONITRATE ER 60 MG PO TB24: 60.0000 mg | ORAL_TABLET | Freq: Every day | ORAL | Status: DC

## 2012-05-30 NOTE — Assessment & Plan Note (Signed)
Cardiomyopathy is well compensated at present, but medical therapy is not optimized.  Aldactone will be substituted for hydrochlorothiazide at a dose of 25 mg per day and electrolytes and renal function followed closely.  Dose of hydralazine will be titrated upwards and nitrates added.  I will see this nice gentleman again in 2 months at which time an echocardiogram will be performed.  If EF remains 35% or less, he will be referred for evaluation by one of our electrophysiologists.  The risks and benefits of AICD implantation were discussed with him.  He agrees to proceed if necessary.  EKG was reviewed.  QRS duration is slightly increased, but does not warrant biventricular pacing.

## 2012-05-30 NOTE — Assessment & Plan Note (Signed)
The patient reports that hypertension has been severe at times, but that he has been under continuous medical observation and treatment.  He has never required hospitalization for this problem.

## 2012-05-30 NOTE — Patient Instructions (Addendum)
Increase Hydralazine to 50mg  three times per day.  Start Imdur 60mg  daily.  Start Aldactone 25mg  daily.  STOP taking HCTZ.  LABS TO BE DRAWN IN 1 to 3 WEEKS:  BMET, IRON and IRON BINDING.  Your physician has requested that you have an echocardiogram. Echocardiography is a painless test that uses sound waves to create images of your heart. It provides your doctor with information about the size and shape of your heart and how well your heart's chambers and valves are working. This procedure takes approximately one hour. There are no restrictions for this procedure.  Your physician recommends that you schedule a follow-up appointment in: 10 weeks with Dr. Dietrich Pates.

## 2012-05-30 NOTE — Assessment & Plan Note (Signed)
Patient has been referred to Dr. Juanetta Gosling for attention to this problem.

## 2012-05-30 NOTE — Progress Notes (Signed)
Patient ID: James [name], male   DOB: [birth year], 51 y.o.   MRN: [id number]  HPI: Scheduled return visit for this very nice gentleman with recent onset of cardiomyopathy of uncertain etiology.  He presented with congestive heart failure 3 months ago and was found to have an EF of 25%.  Cardiac catheterization, which had revealed insignificant coronary disease in 2002 was repeated and was unchanged other than for the marked decrease in left ventricular systolic function.  He has no exposure to HIV.  He has not had any hematologic abnormalities, consumed excessive alcohol and has had a recent normal TSH level.  There is a strong family history for heart disease including both coronary disease and CHF, but no episodes of sudden death in a close relative.  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  He has episodes of sensing that he requires a deep breath, but these do not appear to represent true dyspnea at rest.  Exercise capacity continues to be somewhat lower than it was before he developed heart problems.  Prior to Admission medications   Medication Sig Start Date End Date Taking? Authorizing Provider  aspirin 81 MG tablet Take 81 mg by mouth daily.     Yes Historical Provider, MD  carvedilol (COREG) 25 MG tablet Take 1 tablet (25 mg total) by mouth 2 (two) times daily. 05/12/12 05/12/13 Yes Jodelle Gross, NP  furosemide (LASIX) 40 MG tablet Take 1 tablet (40 mg total) by mouth every morning. 04/03/12 04/03/13 Yes Jodelle Gross, NP  hydrALAZINE (APRESOLINE) 50 MG tablet Take 1 tablet (50 mg total) by mouth 3 (three) times daily. 05/30/12 05/30/13 Yes Kathlen Brunswick, MD  ibuprofen (ADVIL,MOTRIN) 200 MG tablet Take 200 mg by mouth every 6 (six) hours as needed.   Yes Historical Provider, MD  lisinopril (PRINIVIL,ZESTRIL) 20 MG tablet Take 1 tablet (20 mg total) by mouth daily. 05/12/12 05/12/13 Yes Jodelle Gross, NP    loratadine (CLARITIN) 10 MG tablet Take 10 mg by mouth daily.   Yes Historical Provider, MD  pantoprazole (PROTONIX) 40 MG tablet Take 1 tablet (40 mg total) by mouth daily. 03/07/12 03/07/13 Yes Jodelle Gross, NP  isosorbide mononitrate (IMDUR) 60 MG 24 hr tablet Take 1 tablet (60 mg total) by mouth daily. 05/30/12 05/30/13  Kathlen Brunswick, MD  spironolactone (ALDACTONE) 25 MG tablet Take 1 tablet (25 mg total) by mouth daily. 05/30/12 05/30/13  Kathlen Brunswick, MD   No Known Allergies    Past medical history, social history, and family history reviewed and updated.  ROS: Denies orthopnea, PND, pedal edema, palpitations, lightheadedness or syncope.  All other systems reviewed and are negative.  PHYSICAL EXAM: BP 127/79  Pulse 57  Ht 6\' 6"  (1.981 m)  Wt 124.739 kg (275 lb)  BMI 31.78 kg/m2  General-Well developed; no acute distress Body habitus-Mildly overweight Neck-No JVD; no carotid bruits Lungs-clear lung fields; resonant to percussion Cardiovascular-normal PMI; normal S1 and S2; no murmur nor gallop Abdomen-normal bowel sounds; soft and non-tender without masses or organomegaly Musculoskeletal-No deformities, no cyanosis or clubbing Neurologic-Normal cranial nerves; symmetric strength and tone Skin-Warm, no significant lesions Extremities-distal pulses intact; no edema  ASSESSMENT AND PLAN:  [place] Bing, MD 05/30/2012 1:14 PM

## 2012-05-30 NOTE — Assessment & Plan Note (Signed)
Patient inquired about the advisability of cholecystectomy.  He has had no symptoms clearly caused by cholelithiasis.  Cardiomyopathy increases the risk of any surgical intervention.  I advised him to defer elective cholecystectomy for the present.

## 2012-05-30 NOTE — Assessment & Plan Note (Signed)
Patient has noncritical coronary artery disease with no significant progression over the past 10 years and no coronary events.  Aspirin can be discontinued.

## 2012-06-03 ENCOUNTER — Encounter: Payer: Self-pay | Admitting: *Deleted

## 2012-06-18 ENCOUNTER — Other Ambulatory Visit: Payer: Self-pay | Admitting: Cardiology

## 2012-06-19 LAB — BASIC METABOLIC PANEL
CO2: 28 mEq/L (ref 19–32)
Calcium: 9.4 mg/dL (ref 8.4–10.5)
Creat: 1.23 mg/dL (ref 0.50–1.35)
Glucose, Bld: 95 mg/dL (ref 70–99)
Sodium: 137 mEq/L (ref 135–145)

## 2012-06-19 LAB — IRON AND TIBC
%SAT: 24 % (ref 20–55)
Iron: 58 ug/dL (ref 42–165)
UIBC: 181 ug/dL (ref 125–400)

## 2012-07-30 ENCOUNTER — Other Ambulatory Visit (HOSPITAL_COMMUNITY): Payer: BC Managed Care – PPO

## 2012-08-01 ENCOUNTER — Ambulatory Visit (HOSPITAL_COMMUNITY)
Admission: RE | Admit: 2012-08-01 | Discharge: 2012-08-01 | Disposition: A | Discharge status: Home / Self Care | Payer: BC Managed Care – PPO | Source: Ambulatory Visit | Attending: Cardiology | Admitting: Cardiology

## 2012-08-01 DIAGNOSIS — I509 Heart failure, unspecified: Secondary | ICD-10-CM

## 2012-08-01 DIAGNOSIS — F17200 Nicotine dependence, unspecified, uncomplicated: Secondary | ICD-10-CM | POA: Insufficient documentation

## 2012-08-01 DIAGNOSIS — I428 Other cardiomyopathies: Secondary | ICD-10-CM | POA: Insufficient documentation

## 2012-08-01 DIAGNOSIS — I2510 Atherosclerotic heart disease of native coronary artery without angina pectoris: Secondary | ICD-10-CM | POA: Insufficient documentation

## 2012-08-01 DIAGNOSIS — I5020 Unspecified systolic (congestive) heart failure: Secondary | ICD-10-CM

## 2012-08-01 DIAGNOSIS — I10 Essential (primary) hypertension: Secondary | ICD-10-CM | POA: Insufficient documentation

## 2012-08-01 NOTE — Progress Notes (Signed)
*  PRELIMINARY RESULTS* Echocardiogram 2D Echocardiogram has been performed.  James [name] 08/01/2012, 9:09 AM

## 2012-08-05 ENCOUNTER — Encounter: Payer: Self-pay | Admitting: Cardiology

## 2012-08-05 DIAGNOSIS — I77810 Thoracic aortic ectasia: Secondary | ICD-10-CM | POA: Insufficient documentation

## 2012-08-05 DIAGNOSIS — I7121 Aneurysm of the ascending aorta, without rupture: Secondary | ICD-10-CM | POA: Insufficient documentation

## 2012-08-07 ENCOUNTER — Other Ambulatory Visit: Payer: Self-pay | Admitting: *Deleted

## 2012-08-07 DIAGNOSIS — I77810 Thoracic aortic ectasia: Secondary | ICD-10-CM

## 2012-08-14 ENCOUNTER — Encounter: Payer: Self-pay | Admitting: Cardiology

## 2012-08-14 ENCOUNTER — Ambulatory Visit (INDEPENDENT_AMBULATORY_CARE_PROVIDER_SITE_OTHER): Payer: BC Managed Care – PPO | Admitting: Cardiology

## 2012-08-14 VITALS — BP 162/78 | HR 72 | Ht 78.0 in | Wt 282.0 lb

## 2012-08-14 DIAGNOSIS — I10 Essential (primary) hypertension: Secondary | ICD-10-CM

## 2012-08-14 DIAGNOSIS — I429 Cardiomyopathy, unspecified: Secondary | ICD-10-CM

## 2012-08-14 DIAGNOSIS — I428 Other cardiomyopathies: Secondary | ICD-10-CM

## 2012-08-14 MED ORDER — LISINOPRIL 40 MG PO TABS: 40.0000 mg | ORAL_TABLET | Freq: Every day | ORAL | Status: DC

## 2012-08-14 MED ORDER — HYDRALAZINE HCL 100 MG PO TABS: 100.0000 mg | ORAL_TABLET | Freq: Three times a day (TID) | ORAL | Status: DC

## 2012-08-14 MED ORDER — ISOSORBIDE MONONITRATE ER 120 MG PO TB24: 120.0000 mg | ORAL_TABLET | Freq: Every day | ORAL | Status: DC

## 2012-08-14 MED ORDER — CARVEDILOL 25 MG PO TABS: 50.0000 mg | ORAL_TABLET | Freq: Two times a day (BID) | ORAL | Status: DC

## 2012-08-14 NOTE — Assessment & Plan Note (Addendum)
LV systolic function has improved substantially with medical therapy and the passage of time.  Based upon EF, implantation of an AICD is not warranted.  Medication doses will be further titrated upwards.  Normal iron studies-hemochromatosis excluded.

## 2012-08-14 NOTE — Patient Instructions (Signed)
Your physician recommends that you schedule a follow-up appointment in: 6 weeks  Your physician recommends that you return for lab work in: Now and in 1 month  NO SALT SUBSTITUTE  Your physician has requested that you regularly monitor and record your blood pressure readings at home. Please use the same machine at the same time of day to check your readings and record them to bring to your follow-up visit.  Your physician has recommended you make the following change in your medication:  1 - INCREASE Coreg to 50 mg twice daily 2 - START Hydralazine 100 mg three times a day 3 - START Imdur 120 mg daily 4 - START Lisinopril

## 2012-08-14 NOTE — Progress Notes (Signed)
Patient ID: James [name], male   DOB: 02/18/1961, 51 y.o.   MRN: [id number]  HPI: Scheduled return visits for this very nice gentleman with recently diagnosed cardiomyopathy and long-standing hypertension.  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  Energy level is good.  Blood pressure previously was well controlled, but recently has been elevated.  Patient attributes this to increased salt intake in prepared meats.  Prior to Admission medications   Medication Sig Start Date End Date Taking? Authorizing Provider  aspirin 81 MG tablet Take 81 mg by mouth daily.     Yes Historical Provider, MD  carvedilol (COREG) 25 MG tablet Take 2 tablets (50 mg total) by mouth 2 (two) times daily. 08/14/12 08/14/13 Yes Kathlen Brunswick, MD  furosemide (LASIX) 40 MG tablet Take 1 tablet (40 mg total) by mouth every morning. 04/03/12 04/03/13 Yes Jodelle Gross, NP  ibuprofen (ADVIL,MOTRIN) 200 MG tablet Take 200 mg by mouth every 6 (six) hours as needed.   Yes Historical Provider, MD  lisinopril (PRINIVIL,ZESTRIL) 20 MG tablet Take 1 tablet (20 mg total) by mouth daily. 05/12/12 05/12/13 Yes Jodelle Gross, NP  loratadine (CLARITIN) 10 MG tablet Take 10 mg by mouth daily.   Yes Historical Provider, MD  pantoprazole (PROTONIX) 40 MG tablet Take 1 tablet (40 mg total) by mouth daily. 03/07/12 03/07/13 Yes Jodelle Gross, NP  spironolactone (ALDACTONE) 25 MG tablet Take 1 tablet (25 mg total) by mouth daily. 05/30/12 05/30/13 Yes Kathlen Brunswick, MD  hydrALAZINE (APRESOLINE) 100 MG tablet Take 1 tablet (100 mg total) by mouth 3 (three) times daily. 08/14/12   Kathlen Brunswick, MD  isosorbide mononitrate (IMDUR) 120 MG 24 hr tablet Take 1 tablet (120 mg total) by mouth daily. 08/14/12   Kathlen Brunswick, MD  lisinopril (PRINIVIL,ZESTRIL) 40 MG tablet Take 1 tablet (40 mg total) by mouth  daily. 08/14/12   Kathlen Brunswick, MD   No Known Allergies    Past medical history, social history, and family history reviewed and updated.  ROS: Denies orthopnea, PND, ankle edema, palpitations, lightheadedness or syncope.  All other systems reviewed and are negative.  PHYSICAL EXAM: BP 162/78  Pulse 72  Ht 6\' 6"  (1.981 m)  Wt 127.914 kg (282 lb)  BMI 32.59 kg/m2  SpO2 96%;  Repeat blood pressure: 165/80 General-Well developed; no acute distress Body habitus-mildly overweight Neck-No JVD; no carotid bruits Lungs-clear lung fields; resonant to percussion Cardiovascular-normal PMI; normal S1 and S2 Abdomen-normal bowel sounds; soft and non-tender without masses or organomegaly Musculoskeletal-No deformities, no cyanosis or clubbing Neurologic-Normal cranial nerves; symmetric strength and tone Skin-Warm, no significant lesions Extremities-distal pulses intact; no edema  ASSESSMENT AND PLAN:  [place] Bing, MD 08/14/2012 5:53 PM

## 2012-08-14 NOTE — Progress Notes (Deleted)
Name: James [name]    DOB: 12-Apr-1961  Age: 51 y.o.  MR#: [id number]       PCP:  Avon Gully, MD      Insurance: @PAYORNAME @   CC:    Chief Complaint  Patient presents with  . Follow-up    Questions regarding procedure.   MEDICATION BOTTLES  VS BP 162/78  Pulse 72  Ht 6\' 6"  (1.981 m)  Wt 282 lb (127.914 kg)  BMI 32.59 kg/m2  SpO2 96%  Weights Current Weight  08/14/12 282 lb (127.914 kg)  05/30/12 275 lb (124.739 kg)  05/12/12 282 lb (127.914 kg)    Blood Pressure  BP Readings from Last 3 Encounters:  08/14/12 162/78  05/30/12 127/79  05/12/12 147/88     Admit date:  (Not on file) Last encounter with RMR:  08/05/2012   Allergy No Known Allergies  Current Outpatient Prescriptions  Medication Sig Dispense Refill  . aspirin 81 MG tablet Take 81 mg by mouth daily.        . carvedilol (COREG) 25 MG tablet Take 1 tablet (25 mg total) by mouth 2 (two) times daily.  60 tablet  6  . furosemide (LASIX) 40 MG tablet Take 1 tablet (40 mg total) by mouth every morning.  30 tablet  3  . hydrALAZINE (APRESOLINE) 50 MG tablet Take 1 tablet (50 mg total) by mouth 3 (three) times daily.  270 tablet  3  . ibuprofen (ADVIL,MOTRIN) 200 MG tablet Take 200 mg by mouth every 6 (six) hours as needed.      . isosorbide mononitrate (IMDUR) 60 MG 24 hr tablet Take 1 tablet (60 mg total) by mouth daily.  90 tablet  3  . lisinopril (PRINIVIL,ZESTRIL) 20 MG tablet Take 1 tablet (20 mg total) by mouth daily.  30 tablet  6  . loratadine (CLARITIN) 10 MG tablet Take 10 mg by mouth daily.      . pantoprazole (PROTONIX) 40 MG tablet Take 1 tablet (40 mg total) by mouth daily.  30 tablet  12  . spironolactone (ALDACTONE) 25 MG tablet Take 1 tablet (25 mg total) by mouth daily.  90 tablet  3    Discontinued Meds:   There are no discontinued medications.  Patient Active Problem List  Diagnosis  . Hypertension  . Arteriosclerotic cardiovascular disease (ASCVD)  . Cardiomyopathy  . Obstructive  sleep apnea  . Cholelithiasis  . Aortic root dilatation    LABS Orders Only on 06/18/2012  Component Date Value  . Iron 06/18/2012 58   . UIBC 06/18/2012 181   . TIBC 06/18/2012 239   . %SAT 06/18/2012 24   . Sodium 06/18/2012 137   . Potassium 06/18/2012 4.9   . Chloride 06/18/2012 105   . CO2 06/18/2012 28   . Glucose, Bld 06/18/2012 95   . BUN 06/18/2012 21   . Creat 06/18/2012 1.23   . Calcium 06/18/2012 9.4      Results for this Opt Visit:     Results for orders placed in visit on 06/18/12  IRON AND TIBC      Component Value Range   Iron 58  42 - 165 ug/dL   UIBC 409  811 - 914 ug/dL   TIBC 782  956 - 213 ug/dL   %SAT 24  20 - 55 %  BASIC METABOLIC PANEL      Component Value Range   Sodium 137  135 - 145 mEq/L   Potassium 4.9  3.5 - 5.3  mEq/L   Chloride 105  96 - 112 mEq/L   CO2 28  19 - 32 mEq/L   Glucose, Bld 95  70 - 99 mg/dL   BUN 21  6 - 23 mg/dL   Creat 7.84  6.96 - 2.95 mg/dL   Calcium 9.4  8.4 - 28.4 mg/dL    EKG Orders placed in visit on 04/03/12  . EKG 12-LEAD     Prior Assessment and Plan Problem List as of 08/14/2012            Cardiology Problems   Hypertension   Last Assessment & Plan Note   05/30/2012 Office Visit Signed 05/30/2012  1:21 PM by Kathlen Brunswick, MD    The patient reports that hypertension has been severe at times, but that he has been under continuous medical observation and treatment.  He has never required hospitalization for this problem.    Arteriosclerotic cardiovascular disease (ASCVD)   Last Assessment & Plan Note   05/30/2012 Office Visit Signed 05/30/2012  1:54 PM by Kathlen Brunswick, MD    Patient has noncritical coronary artery disease with no significant progression over the past 10 years and no coronary events.  Aspirin can be discontinued.    Cardiomyopathy   Last Assessment & Plan Note   05/30/2012 Office Visit Signed 05/30/2012  1:57 PM by Kathlen Brunswick, MD    Cardiomyopathy is well compensated at  present, but medical therapy is not optimized.  Aldactone will be substituted for hydrochlorothiazide at a dose of 25 mg per day and electrolytes and renal function followed closely.  Dose of hydralazine will be titrated upwards and nitrates added.  I will see this nice gentleman again in 2 months at which time an echocardiogram will be performed.  If EF remains 35% or less, he will be referred for evaluation by one of our electrophysiologists.  The risks and benefits of AICD implantation were discussed with him.  He agrees to proceed if necessary.  EKG was reviewed.  QRS duration is slightly increased, but does not warrant biventricular pacing.    Aortic root dilatation     Other   Obstructive sleep apnea   Last Assessment & Plan Note   05/30/2012 Office Visit Signed 05/30/2012  1:58 PM by Kathlen Brunswick, MD    Patient has been referred to Dr. Juanetta Gosling for attention to this problem.    Cholelithiasis   Last Assessment & Plan Note   05/30/2012 Office Visit Signed 05/30/2012  2:00 PM by Kathlen Brunswick, MD    Patient inquired about the advisability of cholecystectomy.  He has had no symptoms clearly caused by cholelithiasis.  Cardiomyopathy increases the risk of any surgical intervention.  I advised him to defer elective cholecystectomy for the present.        Imaging: No results found.   FRS Calculation: Score not calculated. Missing: Total Cholesterol

## 2012-08-14 NOTE — Assessment & Plan Note (Signed)
Blood pressure control is suboptimal.  Additional teaching provided regarding a low-salt diet.  Due to treatment with spironolactone and high normal serum potassium, salt substitute is not recommended either.  Potassium restricted diet also explained to patient.  He will monitor blood pressure at home and return in one month for a visit with the cardiology nurses.  In the interim, he will reduce his salt intake.  Medications adjusted with an increase in the dose of carvedilol, hydralazine, isosorbide mononitrate and lisinopril.

## 2012-08-15 LAB — BASIC METABOLIC PANEL
Calcium: 9.2 mg/dL (ref 8.4–10.5)
Chloride: 106 mEq/L (ref 96–112)
Creat: 1.22 mg/dL (ref 0.50–1.35)
Sodium: 142 mEq/L (ref 135–145)

## 2012-09-02 ENCOUNTER — Other Ambulatory Visit: Payer: Self-pay | Admitting: Cardiology

## 2012-09-02 MED ORDER — FUROSEMIDE 40 MG PO TABS: 40.0000 mg | ORAL_TABLET | ORAL | Status: DC

## 2012-09-05 ENCOUNTER — Other Ambulatory Visit: Payer: Self-pay | Admitting: *Deleted

## 2012-09-05 DIAGNOSIS — I10 Essential (primary) hypertension: Secondary | ICD-10-CM

## 2012-09-17 ENCOUNTER — Encounter: Payer: Self-pay | Admitting: *Deleted

## 2012-09-25 ENCOUNTER — Ambulatory Visit: Payer: BC Managed Care – PPO | Admitting: Cardiology

## 2012-09-26 ENCOUNTER — Encounter: Payer: Self-pay | Admitting: Adult Health

## 2012-09-26 ENCOUNTER — Ambulatory Visit (INDEPENDENT_AMBULATORY_CARE_PROVIDER_SITE_OTHER): Payer: BC Managed Care – PPO | Admitting: Adult Health

## 2012-09-26 VITALS — BP 145/88 | HR 66 | Ht 78.0 in | Wt 281.1 lb

## 2012-09-26 DIAGNOSIS — I10 Essential (primary) hypertension: Secondary | ICD-10-CM

## 2012-09-26 DIAGNOSIS — I428 Other cardiomyopathies: Secondary | ICD-10-CM

## 2012-09-26 LAB — BASIC METABOLIC PANEL
CO2: 29 mEq/L (ref 19–32)
Chloride: 104 mEq/L (ref 96–112)
Potassium: 4.3 mEq/L (ref 3.5–5.3)

## 2012-09-26 NOTE — Assessment & Plan Note (Signed)
Blood pressure is much better controlled compared to previous office visit with medication adjustments per Dr. Dietrich Pates. The patient is tolerating medication well. I am not inclined to make a changes at this time as he is feeling much better and adhering to a low sodium diet. May consider increasing lisinopril to 40 mg in a.m. and 20 mg in the p.m. should his blood pressure getting higher. In August of 2013 his lowest blood pressure recorded was 127/79 that he was feeling weak and dizzy at that time. We will see him again in 6 weeks to for continued evaluation and treatment. He is advised to take his blood pressure daily and record, and I have given him a log sheet to do so and bring back with him on next visit. He was advised to continue to stay away from salty foods.

## 2012-09-26 NOTE — Assessment & Plan Note (Addendum)
The patient is tolerating his medication adjustments with Lasix, and increased dose of Coreg. He continues to like them as well. Most recent echocardiogram dated October 2013 showed increase in his LVEF to 45% compared to 20% per echo in June of 2013. Can consider repeating echocardiogram again in the next 2-3 months for reassessment of improvement. On last visit the patient states that he and Dr. Dietrich Pates discuss the need to repeat his cardiac catheterization. I do not find that in Dr. Marvel Plan note. This can be discussed again at ejection fraction does not steadily improved. Last cardiac catheterization was completed in October revealing a 40-50% distal circumflex, 20% right coronary artery. For now we will continue risk reduction strategies. I will have a be BMET drawn today for assessment of kidney function. He is to continue to weight himself daily, and call us  for weight gain up to 3 pounds in 24 hours or 5 pounds in one week.

## 2012-09-26 NOTE — Progress Notes (Deleted)
Name: James [name]    DOB: 1961-10-01  Age: 51 y.o.  MR#: [id number]       PCP:  Avon Gully, MD      Insurance: @PAYORNAME @   CC:   No chief complaint on file.   VS BP 145/88  Pulse 66  Ht 6\' 6"  (1.981 m)  Wt 281 lb 0.8 oz (127.483 kg)  BMI 32.48 kg/m2  SpO2 97%  Weights Current Weight  09/26/12 281 lb 0.8 oz (127.483 kg)  08/14/12 282 lb (127.914 kg)  05/30/12 275 lb (124.739 kg)    Blood Pressure  BP Readings from Last 3 Encounters:  09/26/12 145/88  08/14/12 162/78  05/30/12 127/79     Admit date:  (Not on file) Last encounter with RMR:  05/12/2012   Allergy Not on File  Current Outpatient Prescriptions  Medication Sig Dispense Refill  . aspirin 81 MG tablet Take 81 mg by mouth daily.        . carvedilol (COREG) 25 MG tablet Take 2 tablets (50 mg total) by mouth 2 (two) times daily.  120 tablet  6  . furosemide (LASIX) 40 MG tablet Take 1 tablet (40 mg total) by mouth every morning.  30 tablet  3  . hydrALAZINE (APRESOLINE) 100 MG tablet Take 1 tablet (100 mg total) by mouth 3 (three) times daily.  90 tablet  6  . ibuprofen (ADVIL,MOTRIN) 200 MG tablet Take 200 mg by mouth every 6 (six) hours as needed.      . isosorbide mononitrate (IMDUR) 120 MG 24 hr tablet Take 1 tablet (120 mg total) by mouth daily.  30 tablet  6  . lisinopril (PRINIVIL,ZESTRIL) 40 MG tablet Take 1 tablet (40 mg total) by mouth daily.  30 tablet  6  . loratadine (CLARITIN) 10 MG tablet Take 10 mg by mouth daily.      . pantoprazole (PROTONIX) 40 MG tablet Take 1 tablet (40 mg total) by mouth daily.  30 tablet  12  . spironolactone (ALDACTONE) 25 MG tablet Take 1 tablet (25 mg total) by mouth daily.  90 tablet  3    Discontinued Meds:    Medications Discontinued During This Encounter  Medication Reason  . lisinopril (PRINIVIL,ZESTRIL) 20 MG tablet Error    Patient Active Problem List  Diagnosis  . Hypertension  . Arteriosclerotic cardiovascular disease (ASCVD)  . Cardiomyopathy,  nonischemic  . Obstructive sleep apnea  . Cholelithiasis  . Aortic root dilatation    LABS Office Visit on 08/14/2012  Component Date Value  . Sodium 08/14/2012 142   . Potassium 08/14/2012 4.6   . Chloride 08/14/2012 106   . CO2 08/14/2012 28   . Glucose, Bld 08/14/2012 79   . BUN 08/14/2012 21   . Creat 08/14/2012 1.22   . Calcium 08/14/2012 9.2      Results for this Opt Visit:     Results for orders placed in visit on 08/14/12  BASIC METABOLIC PANEL      Component Value Range   Sodium 142  135 - 145 mEq/L   Potassium 4.6  3.5 - 5.3 mEq/L   Chloride 106  96 - 112 mEq/L   CO2 28  19 - 32 mEq/L   Glucose, Bld 79  70 - 99 mg/dL   BUN 21  6 - 23 mg/dL   Creat 4.09  8.11 - 9.14 mg/dL   Calcium 9.2  8.4 - 78.2 mg/dL    EKG Orders placed in visit on 09/26/12  .  EKG 12-LEAD     Prior Assessment and Plan Problem List as of 09/26/2012          Hypertension   Last Assessment & Plan Note   08/14/2012 Office Visit Signed 08/14/2012  5:58 PM by Kathlen Brunswick, MD    Blood pressure control is suboptimal.  Additional teaching provided regarding a low-salt diet.  Due to treatment with spironolactone and high normal serum potassium, salt substitute is not recommended either.  Potassium restricted diet also explained to patient.  He will monitor blood pressure at home and return in one month for a visit with the cardiology nurses.  In the interim, he will reduce his salt intake.  Medications adjusted with an increase in the dose of carvedilol, hydralazine, isosorbide mononitrate and lisinopril.    Arteriosclerotic cardiovascular disease (ASCVD)   Last Assessment & Plan Note   05/30/2012 Office Visit Signed 05/30/2012  1:54 PM by Kathlen Brunswick, MD    Patient has noncritical coronary artery disease with no significant progression over the past 10 years and no coronary events.  Aspirin can be discontinued.    Cardiomyopathy, nonischemic   Last Assessment & Plan Note    08/14/2012 Office Visit Addendum 08/14/2012  6:01 PM by Kathlen Brunswick, MD    LV systolic function has improved substantially with medical therapy and the passage of time.  Based upon EF, implantation of an AICD is not warranted.  Medication doses will be further titrated upwards.  Normal iron studies-hemochromatosis excluded.    Obstructive sleep apnea   Last Assessment & Plan Note   05/30/2012 Office Visit Signed 05/30/2012  1:58 PM by Kathlen Brunswick, MD    Patient has been referred to Dr. Juanetta Gosling for attention to this problem.    Cholelithiasis   Last Assessment & Plan Note   05/30/2012 Office Visit Signed 05/30/2012  2:00 PM by Kathlen Brunswick, MD    Patient inquired about the advisability of cholecystectomy.  He has had no symptoms clearly caused by cholelithiasis.  Cardiomyopathy increases the risk of any surgical intervention.  I advised him to defer elective cholecystectomy for the present.    Aortic root dilatation       Imaging: No results found.   FRS Calculation: Score not calculated. Missing: Total Cholesterol

## 2012-09-26 NOTE — Patient Instructions (Addendum)
Your physician recommends that you schedule a follow-up appointment in: 6 WEEKS WITH KL/RR  Your physician recommends that you return for lab work in: TODAY (BMET) SLIPS GIVEN  RECORD DAILY BLOOD PRESSURES ON SHEETS GIVEN  WEIGH DAILY AND RECORD WEIGHTS IF YOU NOTE A 5LB WEIGHT GAIN WITHIN 48-72 HOUR TIME SPAN CALL OUR OFFICE [phone]

## 2012-09-26 NOTE — Progress Notes (Signed)
HPI: [name] is a very pleasant 51 year old patient of Dr.  Bing we are following for ongoing assessment of treatment for non-ischemic heart myopathy, atherosclerotic cardiovascular disease, and hypertension. The patient was last seen by Dr. Dietrich Pates on 06/14/2012 with medication adjustments. His Coreg was increased to 25 mg twice a day, hydralazine isosorbide and lotion for were also adjusted. The patient has gone back to work and is feeling better. Blood pressure is much better controlled. He is not complaining of dyspnea, fatigue, chest pressure, or weakness. He states he is feeling better than he has a long time. His blood pressures much better controlled at 145/88 compared 162/78 on last visit. He is tolerating the medication increases very well. He is avoiding salty foods, i.e. cold cuts that he been eating a lot of in the past. Today he comes without complaints. Not on File  Current Outpatient Prescriptions  Medication Sig Dispense Refill  . aspirin 81 MG tablet Take 81 mg by mouth daily.        . carvedilol (COREG) 25 MG tablet Take 2 tablets (50 mg total) by mouth 2 (two) times daily.  120 tablet  6  . furosemide (LASIX) 40 MG tablet Take 1 tablet (40 mg total) by mouth every morning.  30 tablet  3  . hydrALAZINE (APRESOLINE) 100 MG tablet Take 1 tablet (100 mg total) by mouth 3 (three) times daily.  90 tablet  6  . ibuprofen (ADVIL,MOTRIN) 200 MG tablet Take 200 mg by mouth every 6 (six) hours as needed.      . isosorbide mononitrate (IMDUR) 120 MG 24 hr tablet Take 1 tablet (120 mg total) by mouth daily.  30 tablet  6  . lisinopril (PRINIVIL,ZESTRIL) 40 MG tablet Take 1 tablet (40 mg total) by mouth daily.  30 tablet  6  . loratadine (CLARITIN) 10 MG tablet Take 10 mg by mouth daily.      . pantoprazole (PROTONIX) 40 MG tablet Take 1 tablet (40 mg total) by mouth daily.  30 tablet  12  . spironolactone (ALDACTONE) 25 MG tablet Take 1 tablet (25 mg total) by mouth daily.  90  tablet  3    Past Medical History  Diagnosis Date  . Arteriosclerotic cardiovascular disease (ASCVD)     Insignificant disease in 2002 and 2013  . Hypertension   . Tobacco abuse   . Obstructive sleep apnea   . Cardiomyopathy     CHF and EF of 25% in 02/2012; normal TSH  . Cholelithiasis     Past Surgical History  Procedure Date  . Cardiac catheterization 2002    [id number] of systems complete and found to be negative unless listed above  PHYSICAL EXAM BP 145/88  Pulse 66  Ht 6\' 6"  (1.981 m)  Wt 281 lb 0.8 oz (127.483 kg)  BMI 32.48 kg/m2  SpO2 97%  General: Well developed, well nourished, in no acute distress Head: Eyes PERRLA, No xanthomas.   Normal cephalic and atramatic  Lungs: Clear bilaterally to auscultation and percussion. Heart: HRRR S1 S2, distant heart sounds,without MRG.  Pulses are 2+ & equal.            No carotid bruit. No JVD.  No abdominal bruits. No femoral bruits. Abdomen: Bowel sounds are positive, abdomen soft and non-tender without masses or                  Hernia's noted. Msk:  Back normal, normal gait. Normal strength and tone for age.  Extremities: No clubbing, cyanosis or edema.  DP +1 Neuro: Alert and oriented X 3. Psych:  Good affect, responds appropriately  WUJ:[id number] bradycardia, rate of 55 bpm, Inferio/lateral T-wave inversion.  ASSESSMENT AND PLAN

## 2012-09-29 ENCOUNTER — Encounter: Payer: Self-pay | Admitting: *Deleted

## 2012-10-31 ENCOUNTER — Ambulatory Visit (INDEPENDENT_AMBULATORY_CARE_PROVIDER_SITE_OTHER): Payer: BC Managed Care – PPO | Admitting: Cardiology

## 2012-10-31 ENCOUNTER — Encounter: Payer: Self-pay | Admitting: Cardiology

## 2012-10-31 ENCOUNTER — Encounter: Payer: Self-pay | Admitting: *Deleted

## 2012-10-31 VITALS — BP 125/75 | HR 66 | Ht 78.0 in | Wt 278.0 lb

## 2012-10-31 DIAGNOSIS — I428 Other cardiomyopathies: Secondary | ICD-10-CM

## 2012-10-31 DIAGNOSIS — I2510 Atherosclerotic heart disease of native coronary artery without angina pectoris: Secondary | ICD-10-CM

## 2012-10-31 DIAGNOSIS — I77810 Thoracic aortic ectasia: Secondary | ICD-10-CM

## 2012-10-31 DIAGNOSIS — I10 Essential (primary) hypertension: Secondary | ICD-10-CM

## 2012-10-31 DIAGNOSIS — E782 Mixed hyperlipidemia: Secondary | ICD-10-CM

## 2012-10-31 DIAGNOSIS — I7090 Unspecified atherosclerosis: Secondary | ICD-10-CM

## 2012-10-31 LAB — LIPID PANEL
HDL: 38 mg/dL — ABNORMAL LOW (ref >39)
LDL Cholesterol: 123 mg/dL — ABNORMAL HIGH (ref 0–99)
Triglycerides: 143 mg/dL (ref <150)
VLDL: 29 mg/dL (ref 0–40)

## 2012-10-31 MED ORDER — PRAVASTATIN SODIUM 40 MG PO TABS: 40.0000 mg | ORAL_TABLET | Freq: Every evening | ORAL | Status: DC

## 2012-10-31 NOTE — Patient Instructions (Addendum)
Your physician recommends that you schedule a follow-up appointment in: 5 months  Your physician recommends that you return for lab work in: Today and in 3 months  Your physician has recommended you make the following change in your medication:  1 - START Pravastatin 40 mg daily  STOP Smoking

## 2012-10-31 NOTE — Assessment & Plan Note (Signed)
Blood pressure control is excellent with current medication, which will be continued. 

## 2012-10-31 NOTE — Progress Notes (Deleted)
Name: James [name]    DOB: [birth year]  Age: 52 y.o.  MR#: [id number]       PCP:  Avon Gully, MD      Insurance: @PAYORNAME @   CC:   No chief complaint on file.  MEDICATION BOTTLES   VS BP 125/75  Pulse 66  Ht 6\' 6"  (1.981 m)  Wt 278 lb (126.1 kg)  BMI 32.13 kg/m2  Weights Current Weight  10/31/12 278 lb (126.1 kg)  09/26/12 281 lb 0.8 oz (127.483 kg)  08/14/12 282 lb (127.914 kg)    Blood Pressure  BP Readings from Last 3 Encounters:  10/31/12 125/75  09/26/12 145/88  08/14/12 162/78     Admit date:  (Not on file) Last encounter with RMR:  08/14/2012   Allergy Not on File  Current Outpatient Prescriptions  Medication Sig Dispense Refill  . aspirin 81 MG tablet Take 81 mg by mouth daily.        . carvedilol (COREG) 25 MG tablet Take 2 tablets (50 mg total) by mouth 2 (two) times daily.  120 tablet  6  . furosemide (LASIX) 40 MG tablet Take 1 tablet (40 mg total) by mouth every morning.  30 tablet  3  . hydrALAZINE (APRESOLINE) 100 MG tablet Take 1 tablet (100 mg total) by mouth 3 (three) times daily.  90 tablet  6  . ibuprofen (ADVIL,MOTRIN) 200 MG tablet Take 200 mg by mouth every 6 (six) hours as needed.      . isosorbide mononitrate (IMDUR) 120 MG 24 hr tablet Take 1 tablet (120 mg total) by mouth daily.  30 tablet  6  . lisinopril (PRINIVIL,ZESTRIL) 40 MG tablet Take 1 tablet (40 mg total) by mouth daily.  30 tablet  6  . loratadine (CLARITIN) 10 MG tablet Take 10 mg by mouth daily.      . pantoprazole (PROTONIX) 40 MG tablet Take 1 tablet (40 mg total) by mouth daily.  30 tablet  12  . spironolactone (ALDACTONE) 25 MG tablet Take 1 tablet (25 mg total) by mouth daily.  90 tablet  3    Discontinued Meds:   There are no discontinued medications.  Patient Active Problem List  Diagnosis  . Hypertension  . Arteriosclerotic cardiovascular disease (ASCVD)  . Cardiomyopathy, nonischemic  . Obstructive sleep apnea  . Cholelithiasis  . Aortic root dilatation     LABS Office Visit on 09/26/2012  Component Date Value  . Sodium 09/26/2012 141   . Potassium 09/26/2012 4.3   . Chloride 09/26/2012 104   . CO2 09/26/2012 29   . Glucose, Bld 09/26/2012 84   . BUN 09/26/2012 24*  . Creat 09/26/2012 1.10   . Calcium 09/26/2012 8.9   Office Visit on 08/14/2012  Component Date Value  . Sodium 08/14/2012 142   . Potassium 08/14/2012 4.6   . Chloride 08/14/2012 106   . CO2 08/14/2012 28   . Glucose, Bld 08/14/2012 79   . BUN 08/14/2012 21   . Creat 08/14/2012 1.22   . Calcium 08/14/2012 9.2      Results for this Opt Visit:     Results for orders placed in visit on 09/26/12  BASIC METABOLIC PANEL      Component Value Range   Sodium 141  135 - 145 mEq/L   Potassium 4.3  3.5 - 5.3 mEq/L   Chloride 104  96 - 112 mEq/L   CO2 29  19 - 32 mEq/L   Glucose, Bld 84  70 -  99 mg/dL   BUN 24 (*) 6 - 23 mg/dL   Creat 1.61  0.96 - 0.45 mg/dL   Calcium 8.9  8.4 - 40.9 mg/dL    EKG Orders placed in visit on 09/26/12  . EKG 12-LEAD     Prior Assessment and Plan Problem List as of 10/31/2012            Cardiology Problems   Hypertension   Last Assessment & Plan Note   09/26/2012 Office Visit Signed 09/26/2012 11:39 AM by Jodelle Gross, NP    Blood pressure is much better controlled compared to previous office visit with medication adjustments per Dr. Dietrich Pates. The patient is tolerating medication well. I am not inclined to make a changes at this time as he is feeling much better and adhering to a low sodium diet. May consider increasing lisinopril to 40 mg in a.m. and 20 mg in the p.m. should his blood pressure getting higher. In August of 2013 his lowest blood pressure recorded was 127/79 that he was feeling weak and dizzy at that time. We will see him again in 6 weeks to for continued evaluation and treatment. He is advised to take his blood pressure daily and record, and I have given him a log sheet to do so and bring back with him on next  visit. He was advised to continue to stay away from salty foods.    Arteriosclerotic cardiovascular disease (ASCVD)   Last Assessment & Plan Note   05/30/2012 Office Visit Signed 05/30/2012  1:54 PM by Kathlen Brunswick, MD    Patient has noncritical coronary artery disease with no significant progression over the past 10 years and no coronary events.  Aspirin can be discontinued.    Cardiomyopathy, nonischemic   Last Assessment & Plan Note   09/26/2012 Office Visit Addendum 09/26/2012 11:42 AM by Jodelle Gross, NP    The patient is tolerating his medication adjustments with Lasix, and increased dose of Coreg. He continues to like them as well. Most recent echocardiogram dated October 2013 showed increase in his LVEF to 45% compared to 20% per echo in June of 2013. Can consider repeating echocardiogram again in the next 2-3 months for reassessment of improvement. On last visit the patient states that he and Dr. Dietrich Pates discuss the need to repeat his cardiac catheterization. I do not find that in Dr. Marvel Plan note. This can be discussed again at ejection fraction does not steadily improved. Last cardiac catheterization was completed in October revealing a 40-50% distal circumflex, 20% right coronary artery. For now we will continue risk reduction strategies. I will have a be BMET drawn today for assessment of kidney function. He is to continue to weight himself daily, and call us  for weight gain up to 3 pounds in 24 hours or 5 pounds in one week.    Aortic root dilatation     Other   Obstructive sleep apnea   Last Assessment & Plan Note   05/30/2012 Office Visit Signed 05/30/2012  1:58 PM by Kathlen Brunswick, MD    Patient has been referred to Dr. Juanetta Gosling for attention to this problem.    Cholelithiasis   Last Assessment & Plan Note   05/30/2012 Office Visit Signed 05/30/2012  2:00 PM by Kathlen Brunswick, MD    Patient inquired about the advisability of cholecystectomy.  He has had no  symptoms clearly caused by cholelithiasis.  Cardiomyopathy increases the risk of any surgical intervention.  I advised  him to defer elective cholecystectomy for the present.        Imaging: No results found.   FRS Calculation: Score not calculated. Missing: Total Cholesterol

## 2012-10-31 NOTE — Assessment & Plan Note (Addendum)
Nonobstructive coronary disease suggests the need to optimally control cardiovascular risk factors.  A lipid profile will be obtained and statin therapy initiated with 40 mg of pravastatin per day.  Blood pressure control is already optimal, and patient has been strongly encouraged to discontinue smoking.

## 2012-10-31 NOTE — Assessment & Plan Note (Signed)
Marked improvement in the ejection fraction may be attributable to medications or to spontaneous recovery from the unknown etiology of his heart disease.  Medical regime is quite complex, and I would be inclined to discontinue hydralazine and nitrates if he continues to do this well.

## 2012-10-31 NOTE — Assessment & Plan Note (Signed)
Monitoring with annual echocardiograms.

## 2012-10-31 NOTE — Progress Notes (Signed)
Patient ID: James [name], male   DOB: 12/21/1960, 52 y.o.   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  Since his last visit, he has done superbly.  He denies all cardiopulmonary symptoms.  He is not employed, but remains active including performing yard work and rabbit hunting without difficulty.  Unfortunately, he continues to smoke cigarettes at a reduced rate of 3/4 pack per day.    Prior to Admission medications   Medication Sig Start Date End Date Taking? Authorizing Provider  aspirin 81 MG tablet Take 81 mg by mouth daily.     Yes Historical Provider, MD  carvedilol (COREG) 25 MG tablet Take 2 tablets (50 mg total) by mouth 2 (two) times daily. 08/14/12 08/14/13 Yes Kathlen Brunswick, MD  furosemide (LASIX) 40 MG tablet Take 1 tablet (40 mg total) by mouth every morning. 09/02/12 09/02/13 Yes Kathlen Brunswick, MD  hydrALAZINE (APRESOLINE) 100 MG tablet Take 1 tablet (100 mg total) by mouth 3 (three) times daily. 08/14/12  Yes Kathlen Brunswick, MD  ibuprofen (ADVIL,MOTRIN) 200 MG tablet Take 200 mg by mouth every 6 (six) hours as needed.   Yes Historical Provider, MD  isosorbide mononitrate (IMDUR) 120 MG 24 hr tablet Take 1 tablet (120 mg total) by mouth daily. 08/14/12  Yes Kathlen Brunswick, MD  lisinopril (PRINIVIL,ZESTRIL) 40 MG tablet Take 1 tablet (40 mg total) by mouth daily. 08/14/12  Yes Kathlen Brunswick, MD  loratadine (CLARITIN) 10 MG tablet Take 10 mg by mouth daily.   Yes Historical Provider, MD  pantoprazole (PROTONIX) 40 MG tablet Take 1 tablet (40 mg total) by mouth daily. 03/07/12 03/07/13 Yes Jodelle Gross, NP  spironolactone (ALDACTONE) 25 MG tablet Take 1 tablet (25 mg total) by mouth daily. 05/30/12 05/30/13 Yes Kathlen Brunswick, MD  pravastatin (PRAVACHOL) 40 MG tablet Take 1 tablet (40 mg total) by mouth every evening. 10/31/12   Kathlen Brunswick, MD    Allergies-none known    Past medical history, social history, and family history reviewed and updated.  ROS: Denies chest pain, dyspnea, orthopnea, PND, palpitations, lightheadedness or syncope.  All other systems reviewed and are negative.  PHYSICAL EXAM: BP 125/75  Pulse 66  Ht 6\' 6"  (1.981 m)  Wt 126.1 kg (278 lb)  BMI 32.13 kg/m2  General-Well developed; no acute distress Body habitus-mildly to moderately overweight Neck-No JVD; no carotid bruits Lungs-clear lung fields; resonant to percussion Cardiovascular-normal PMI; normal S1 and S2; prominent fourth heart sound present Abdomen-normal bowel sounds; soft and non-tender without masses or organomegaly Musculoskeletal-No deformities, no cyanosis or clubbing Neurologic-Normal cranial nerves; symmetric strength and tone Skin-Warm, no significant lesions Extremities-distal pulses intact; no edema  ASSESSMENT AND PLAN:  Estherwood Bing, MD 10/31/2012 4:27 PM

## 2012-11-04 ENCOUNTER — Encounter: Payer: Self-pay | Admitting: Cardiology

## 2012-11-04 DIAGNOSIS — E785 Hyperlipidemia, unspecified: Secondary | ICD-10-CM | POA: Insufficient documentation

## 2012-11-05 ENCOUNTER — Encounter: Payer: Self-pay | Admitting: *Deleted

## 2013-02-09 ENCOUNTER — Other Ambulatory Visit: Payer: Self-pay | Admitting: Cardiology

## 2013-03-22 ENCOUNTER — Other Ambulatory Visit: Payer: Self-pay | Admitting: Adult Health

## 2013-03-31 ENCOUNTER — Encounter: Payer: Self-pay | Admitting: Cardiology

## 2013-03-31 ENCOUNTER — Ambulatory Visit (INDEPENDENT_AMBULATORY_CARE_PROVIDER_SITE_OTHER): Payer: BC Managed Care – PPO | Admitting: Cardiology

## 2013-03-31 VITALS — BP 126/78 | HR 77 | Ht 78.0 in | Wt 287.4 lb

## 2013-03-31 DIAGNOSIS — I428 Other cardiomyopathies: Secondary | ICD-10-CM

## 2013-03-31 DIAGNOSIS — I77810 Thoracic aortic ectasia: Secondary | ICD-10-CM

## 2013-03-31 DIAGNOSIS — E785 Hyperlipidemia, unspecified: Secondary | ICD-10-CM

## 2013-03-31 DIAGNOSIS — I2510 Atherosclerotic heart disease of native coronary artery without angina pectoris: Secondary | ICD-10-CM

## 2013-03-31 DIAGNOSIS — I7090 Unspecified atherosclerosis: Secondary | ICD-10-CM

## 2013-03-31 DIAGNOSIS — I255 Ischemic cardiomyopathy: Secondary | ICD-10-CM

## 2013-03-31 DIAGNOSIS — I2589 Other forms of chronic ischemic heart disease: Secondary | ICD-10-CM

## 2013-03-31 DIAGNOSIS — I10 Essential (primary) hypertension: Secondary | ICD-10-CM

## 2013-03-31 MED ORDER — CARVEDILOL 25 MG PO TABS: 50.0000 mg | ORAL_TABLET | Freq: Two times a day (BID) | ORAL | Status: DC

## 2013-03-31 NOTE — Assessment & Plan Note (Addendum)
Blood pressure has been normal with current medications, which will be continued.  A repeat chemistry profile was due in April, but not obtained. This laboratory will be performed today and a basic metabolic profile will be requested in 4 months.

## 2013-03-31 NOTE — Assessment & Plan Note (Signed)
Prominent aortic root dilatation on previous echocardiogram. Diameter will be reassessed by echocardiography in 4 months.

## 2013-03-31 NOTE — Assessment & Plan Note (Signed)
Suboptimal lipid profile on treatment. Pravastatin will be discontinued and atorvastatin 40 mg per day started with reassessment of serum lipids in one month.

## 2013-03-31 NOTE — Patient Instructions (Addendum)
Your physician recommends that you schedule a follow-up appointment in: 8 months  Your physician has requested that you have an echocardiogram. Echocardiography is a painless test that uses sound waves to create images of your heart. It provides your doctor with information about the size and shape of your heart and how well your heart's chambers and valves are working. This procedure takes approximately one hour. There are no restrictions for this procedure October 2014  Your physician recommends that you return for lab work in: TODAY AND IN 4 MONTHS Your physician recommends that you have follow up lab work, we will mail you a reminder letter to alert you when to go [employer], located across the street from our office.

## 2013-03-31 NOTE — Assessment & Plan Note (Signed)
Marked improvement in ejection fraction with medical therapy. I would be inclined to taper his medications if LV systolic function remains near normal. This will be reassessed in 4 months.

## 2013-03-31 NOTE — Assessment & Plan Note (Signed)
Mild coronary artery disease on recent catheterization. To forestall the development of hemodynamically significant disease, we are attempting to optimally control cardiovascular risk factors. Blood pressure has been normal. Patient has no diabetes. He does not use tobacco products. Lipid profile was somewhat suboptimal, but improved with current therapy, which will be intensified.

## 2013-03-31 NOTE — Progress Notes (Deleted)
Name: James [name]    DOB: May 25, 1961  Age: 52 y.o.  MR#: [id number]       PCP:  Avon Gully, MD      Insurance: Payor: BLUE CROSS BLUE [employer]IELD / Plan: BCBS [place] PPO / Product Type: *No Product type* /   CC:   No chief complaint on file.  Bottles  VS Filed Vitals:   03/31/13 1448  BP: 126/78  Pulse: 77  Height: 6\' 6"  (1.981 m)  Weight: 287 lb 7 oz (130.381 kg)    Weights Current Weight  03/31/13 287 lb 7 oz (130.381 kg)  10/31/12 278 lb (126.1 kg)  09/26/12 281 lb 0.8 oz (127.483 kg)    Blood Pressure  BP Readings from Last 3 Encounters:  03/31/13 126/78  10/31/12 125/75  09/26/12 145/88     Admit date:  (Not on file) Last encounter with RMR:  02/09/2013   Allergy Review of patient's allergies indicates no known allergies.  Current Outpatient Prescriptions  Medication Sig Dispense Refill  . aspirin 81 MG tablet Take 81 mg by mouth daily.        . carvedilol (COREG) 25 MG tablet Take 2 tablets (50 mg total) by mouth 2 (two) times daily.  120 tablet  3  . furosemide (LASIX) 40 MG tablet TAKE 1 TABLET (40 MG TOTAL) BY MOUTH EVERY MORNING.  30 tablet  3  . hydrALAZINE (APRESOLINE) 100 MG tablet Take 1 tablet (100 mg total) by mouth 3 (three) times daily.  90 tablet  6  . isosorbide mononitrate (IMDUR) 120 MG 24 hr tablet Take 1 tablet (120 mg total) by mouth daily.  30 tablet  6  . lisinopril (PRINIVIL,ZESTRIL) 40 MG tablet Take 1 tablet (40 mg total) by mouth daily.  30 tablet  6  . loratadine (CLARITIN) 10 MG tablet Take 10 mg by mouth daily.      . pantoprazole (PROTONIX) 40 MG tablet TAKE 1 TABLET (40 MG TOTAL) BY MOUTH DAILY.  30 tablet  9  . pravastatin (PRAVACHOL) 40 MG tablet Take 1 tablet (40 mg total) by mouth every evening.  90 tablet  3  . spironolactone (ALDACTONE) 25 MG tablet Take 1 tablet (25 mg total) by mouth daily.  90 tablet  3   No current facility-administered medications for this visit.    Discontinued Meds:    Medications Discontinued During  This Encounter  Medication Reason  . ibuprofen (ADVIL,MOTRIN) 200 MG tablet Error  . carvedilol (COREG) 25 MG tablet Reorder    Patient Active Problem List   Diagnosis Date Noted  . Hyperlipidemia 11/04/2012  . Aortic root dilatation 08/05/2012  . Cholelithiasis   . Obstructive sleep apnea 04/28/2012  . Cardiomyopathy, nonischemic 04/03/2012  . Hypertension 08/07/2010  . 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/07/2010    LABS    Component Value Date/Time   NA 141 09/26/2012 1130   NA 142 08/14/2012 1503   NA 137 06/18/2012 1055   K 4.3 09/26/2012 1130   K 4.6 08/14/2012 1503   K 4.9 06/18/2012 1055   CL 104 09/26/2012 1130   CL 106 08/14/2012 1503   CL 105 06/18/2012 1055   CO2 29 09/26/2012 1130   CO2 28 08/14/2012 1503   CO2 28 06/18/2012 1055   GLUCOSE 84 09/26/2012 1130   GLUCOSE 79 08/14/2012 1503   GLUCOSE 95 06/18/2012 1055   BUN 24* 09/26/2012 1130   BUN 21 08/14/2012 1503   BUN 21 06/18/2012 1055   CREATININE 1.10  09/26/2012 1130   CREATININE 1.22 08/14/2012 1503   CREATININE 1.23 06/18/2012 1055   CREATININE 1.14 07/31/2010 2033   CREATININE 1.10 07/25/2010 2143   CREATININE 1.29 03/24/2008 0812   CALCIUM 8.9 09/26/2012 1130   CALCIUM 9.2 08/14/2012 1503   CALCIUM 9.4 06/18/2012 1055   GFRNONAA >60 07/31/2010 2033   GFRNONAA >60 07/25/2010 2143   GFRNONAA 60* 03/24/2008 0812   GFRAA  Value: >60        The eGFR has been calculated using the MDRD equation. This calculation has not been validated in all clinical situations. eGFR's persistently <60 mL/min signify possible Chronic Kidney Disease. 07/31/2010 2033   GFRAA  Value: >60        The eGFR has been calculated using the MDRD equation. This calculation has not been validated in all clinical situations. eGFR's persistently <60 mL/min signify possible Chronic Kidney Disease. 07/25/2010 2143   GFRAA  Value: >60        The eGFR has been calculated using the MDRD equation. This calculation has not been validated in  all clinical 03/24/2008 0812   CMP     Component Value Date/Time   NA 141 09/26/2012 1130   K 4.3 09/26/2012 1130   CL 104 09/26/2012 1130   CO2 29 09/26/2012 1130   GLUCOSE 84 09/26/2012 1130   BUN 24* 09/26/2012 1130   CREATININE 1.10 09/26/2012 1130   CREATININE 1.14 07/31/2010 2033   CALCIUM 8.9 09/26/2012 1130   PROT 5.7* 07/26/2010 0453   ALBUMIN 3.4* 07/26/2010 0453   AST 18 07/26/2010 0453   ALT 25 07/26/2010 0453   ALKPHOS 73 07/26/2010 0453   BILITOT 0.9 07/26/2010 0453   GFRNONAA >60 07/31/2010 2033   GFRAA  Value: >60        The eGFR has been calculated using the MDRD equation. This calculation has not been validated in all clinical situations. eGFR's persistently <60 mL/min signify possible Chronic Kidney Disease. 07/31/2010 2033       Component Value Date/Time   WBC 8.6 04/03/2012 1500   WBC 10.0 07/31/2010 2033   WBC 10.0 07/25/2010 2143   HGB 17.4* 04/03/2012 1500   HGB 16.6 07/31/2010 2033   HGB 16.2 07/25/2010 2143   HCT 49.3 04/03/2012 1500   HCT 48.1 07/31/2010 2033   HCT 47.5 07/25/2010 2143   MCV 89.0 04/03/2012 1500   MCV 91.3 07/31/2010 2033   MCV 91.7 07/25/2010 2143    Lipid Panel     Component Value Date/Time   CHOL 190 10/31/2012 1553   TRIG 143 10/31/2012 1553   HDL 38* 10/31/2012 1553   CHOLHDL 5.0 10/31/2012 1553   VLDL 29 10/31/2012 1553   LDLCALC 123* 10/31/2012 1553    ABG No results found for this basename: phart, pco2, pco2art, po2, po2art, hco3, tco2, acidbasedef, o2sat     Lab Results  Component Value Date   TSH 1.242 07/26/2010   BNP (last 3 results) No results found for this basename: PROBNP,  in the last 8760 hours Cardiac Panel (last 3 results) No results found for this basename: CKTOTAL, CKMB, TROPONINI, RELINDX,  in the last 72 hours  Iron/TIBC/Ferritin    Component Value Date/Time   IRON 58 06/18/2012 1055   TIBC 239 06/18/2012 1055     EKG Orders placed in visit on 09/26/12  . EKG 12-LEAD     Prior Assessment and  Plan Problem List as of 03/31/2013   Hypertension   Last Assessment & Plan  10/31/2012 Office Visit Written 10/31/2012  4:32 PM by Kathlen Brunswick, MD     Blood pressure control is excellent with current medication, which will be continued.    Arteriosclerotic cardiovascular disease (ASCVD)   Last Assessment & Plan   10/31/2012 Office Visit Edited 11/06/2012 10:50 AM by Kathlen Brunswick, MD     Nonobstructive coronary disease suggests the need to optimally control cardiovascular risk factors.  A lipid profile will be obtained and statin therapy initiated with 40 mg of pravastatin per day.  Blood pressure control is already optimal, and patient has been strongly encouraged to discontinue smoking.    Cardiomyopathy, nonischemic   Last Assessment & Plan   10/31/2012 Office Visit Written 10/31/2012  4:32 PM by Kathlen Brunswick, MD     Marked improvement in the ejection fraction may be attributable to medications or to spontaneous recovery from the unknown etiology of his heart disease.  Medical regime is quite complex, and I would be inclined to discontinue hydralazine and nitrates if he continues to do this well.    Obstructive sleep apnea   Last Assessment & Plan   05/30/2012 Office Visit Written 05/30/2012  1:58 PM by Kathlen Brunswick, MD     Patient has been referred to Dr. Juanetta Gosling for attention to this problem.    Cholelithiasis   Last Assessment & Plan   05/30/2012 Office Visit Written 05/30/2012  2:00 PM by Kathlen Brunswick, MD     Patient inquired about the advisability of cholecystectomy.  He has had no symptoms clearly caused by cholelithiasis.  Cardiomyopathy increases the risk of any surgical intervention.  I advised him to defer elective cholecystectomy for the present.    Aortic root dilatation   Last Assessment & Plan   10/31/2012 Office Visit Written 10/31/2012  4:30 PM by Kathlen Brunswick, MD     Monitoring with annual echocardiograms.    Hyperlipidemia       Imaging: No  results found.

## 2013-03-31 NOTE — Progress Notes (Signed)
Patient ID: James [name], male   DOB: Nov 15, 1960, 52 y.o.   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 Patient has been asymptomatic in recent months.  Current Outpatient Prescriptions  Medication Sig Dispense Refill  . aspirin 81 MG tablet Take 81 mg by mouth daily.        . carvedilol (COREG) 25 MG tablet Take 2 tablets (50 mg total) by mouth 2 (two) times daily.  120 tablet  3  . furosemide (LASIX) 40 MG tablet TAKE 1 TABLET (40 MG TOTAL) BY MOUTH EVERY MORNING.  30 tablet  3  . hydrALAZINE (APRESOLINE) 100 MG tablet Take 1 tablet (100 mg total) by mouth 3 (three) times daily.  90 tablet  6  . isosorbide mononitrate (IMDUR) 120 MG 24 hr tablet Take 1 tablet (120 mg total) by mouth daily.  30 tablet  6  . lisinopril (PRINIVIL,ZESTRIL) 40 MG tablet Take 1 tablet (40 mg total) by mouth daily.  30 tablet  6  . loratadine (CLARITIN) 10 MG tablet Take 10 mg by mouth daily.      . pantoprazole (PROTONIX) 40 MG tablet TAKE 1 TABLET (40 MG TOTAL) BY MOUTH DAILY.  30 tablet  9  . pravastatin (PRAVACHOL) 40 MG tablet Take 1 tablet (40 mg total) by mouth every evening.  90 tablet  3  . spironolactone (ALDACTONE) 25 MG tablet Take 1 tablet (25 mg total) by mouth daily.  90 tablet  3   No current facility-administered medications for this visit.   No Known Allergies   Past medical history, social history, and family history reviewed and updated.  ROS: Denies chest pain, dyspnea, exercise intolerance, palpitations, lightheadedness or syncope. All other systems reviewed and are negative.  PHYSICAL EXAM: BP 126/78  Pulse 77  Ht 6\' 6"  (1.981 m)  Wt 130.381 kg (287 lb 7 oz)  BMI 33.22 kg/m2;  Body mass index is 33.22 kg/(m^2). General-Well developed; no acute distress Body habitus-mildly to moderately overweight Neck-No JVD; no carotid bruits Lungs-clear lung fields; resonant to percussion Cardiovascular-normal PMI; normal S1  and S2; fourth heart sound present Abdomen-normal bowel sounds; soft and non-tender without masses or organomegaly Musculoskeletal-No deformities, no cyanosis or clubbing Neurologic-Normal cranial nerves; symmetric strength and tone Skin-Warm, no significant lesions Extremities-distal pulses intact; no edema  [place] Bing, MD 03/31/2013  3:36 PM  ASSESSMENT AND PLAN

## 2013-04-02 LAB — COMPREHENSIVE METABOLIC PANEL WITH GFR
ALT: 20 U/L (ref 0–53)
AST: 16 U/L (ref 0–37)
Albumin: 3.6 g/dL (ref 3.5–5.2)
Alkaline Phosphatase: 84 U/L (ref 39–117)
BUN: 18 mg/dL (ref 6–23)
CO2: 23 meq/L (ref 19–32)
Calcium: 9.3 mg/dL (ref 8.4–10.5)
Chloride: 105 meq/L (ref 96–112)
Creat: 1.12 mg/dL (ref 0.50–1.35)
Glucose, Bld: 103 mg/dL — ABNORMAL HIGH (ref 70–99)
Potassium: 4.2 meq/L (ref 3.5–5.3)
Sodium: 138 meq/L (ref 135–145)
Total Bilirubin: 0.4 mg/dL (ref 0.3–1.2)
Total Protein: 6.1 g/dL (ref 6.0–8.3)

## 2013-04-03 ENCOUNTER — Encounter: Payer: Self-pay | Admitting: Cardiology

## 2013-04-03 ENCOUNTER — Encounter: Payer: Self-pay | Admitting: *Deleted

## 2013-06-22 ENCOUNTER — Other Ambulatory Visit: Payer: Self-pay | Admitting: Cardiology

## 2013-07-29 ENCOUNTER — Ambulatory Visit (HOSPITAL_COMMUNITY): Admission: RE | Admit: 2013-07-29 | Payer: BC Managed Care – PPO | Source: Ambulatory Visit

## 2013-07-31 ENCOUNTER — Encounter: Payer: Self-pay | Admitting: *Deleted

## 2013-07-31 ENCOUNTER — Other Ambulatory Visit: Payer: Self-pay | Admitting: *Deleted

## 2013-07-31 DIAGNOSIS — I77810 Thoracic aortic ectasia: Secondary | ICD-10-CM

## 2013-07-31 DIAGNOSIS — I255 Ischemic cardiomyopathy: Secondary | ICD-10-CM

## 2013-07-31 DIAGNOSIS — I10 Essential (primary) hypertension: Secondary | ICD-10-CM

## 2013-08-02 ENCOUNTER — Other Ambulatory Visit: Payer: Self-pay | Admitting: Cardiology

## 2013-09-04 ENCOUNTER — Other Ambulatory Visit: Payer: Self-pay | Admitting: Cardiology

## 2014-01-18 ENCOUNTER — Other Ambulatory Visit: Payer: Self-pay | Admitting: Adult Health

## 2014-03-02 ENCOUNTER — Telehealth: Payer: Self-pay | Admitting: *Deleted

## 2014-03-02 NOTE — Telephone Encounter (Signed)
Pt was referred by Dr. Legrand Rams for screening colonoscopy. LMOM for a return call.

## 2014-03-02 NOTE — Telephone Encounter (Signed)
Pt called to Schedule his colonoscopy  Please advise [phone]

## 2014-03-03 ENCOUNTER — Other Ambulatory Visit: Payer: Self-pay

## 2014-03-03 DIAGNOSIS — Z1211 Encounter for screening for malignant neoplasm of colon: Secondary | ICD-10-CM

## 2014-03-04 NOTE — Telephone Encounter (Signed)
Gastroenterology Pre-Procedure Review  Request Date: 03/03/2014 Requesting Physician: Dr. Legrand Rams  PATIENT REVIEW QUESTIONS: The patient responded to the following health history questions as indicated:    1. Diabetes Melitis: no 2. Joint replacements in the past 12 months: no 3. Major health problems in the past 3 months: no 4. Has an artificial valve or MVP: no 5. Has a defibrillator: no 6. Has been advised in past to take antibiotics in advance of a procedure like teeth cleaning: no    MEDICATIONS & ALLERGIES:    Patient reports the following regarding taking any blood thinners:   Plavix? no Aspirin? YES Coumadin? no  Patient confirms/reports the following medications:  Current Outpatient Prescriptions  Medication Sig Dispense Refill  . aspirin 81 MG tablet Take 81 mg by mouth daily.        . carvedilol (COREG) 25 MG tablet Take 2 tablets (50 mg total) by mouth 2 (two) times daily.  120 tablet  3  . furosemide (LASIX) 40 MG tablet TAKE 1 TABLET (40 MG TOTAL) BY MOUTH EVERY MORNING.  30 tablet  6  . hydrALAZINE (APRESOLINE) 100 MG tablet Take 1 tablet (100 mg total) by mouth 3 (three) times daily.  90 tablet  6  . isosorbide mononitrate (IMDUR) 120 MG 24 hr tablet TAKE 1 TABLET BY MOUTH EVERY DAY  30 tablet  1  . lisinopril (PRINIVIL,ZESTRIL) 40 MG tablet TAKE 1 TABLET BY MOUTH EVERY DAY  30 tablet  1  . loratadine (CLARITIN) 10 MG tablet Take 10 mg by mouth daily.      . pantoprazole (PROTONIX) 40 MG tablet TAKE 1 TABLET (40 MG TOTAL) BY MOUTH DAILY.  30 tablet  9  . pravastatin (PRAVACHOL) 40 MG tablet Take 1 tablet (40 mg total) by mouth every evening.  90 tablet  3  . spironolactone (ALDACTONE) 25 MG tablet TAKE 1 TABLET (25 MG TOTAL) BY MOUTH DAILY.  90 tablet  3  . furosemide (LASIX) 40 MG tablet TAKE 1 TABLET (40 MG TOTAL) BY MOUTH EVERY MORNING.  30 tablet  3   No current facility-administered medications for this visit.    Patient confirms/reports the following  allergies:  No Known Allergies  No orders of the defined types were placed in this encounter.    AUTHORIZATION INFORMATION Primary Insurance:   ID #:   Group #:  Pre-Cert / Auth required: Pre-Cert / Auth #:   Secondary Insurance:   ID #:  Group #:  Pre-Cert / Auth required:  Pre-Cert / Auth #:   SCHEDULE INFORMATION: Procedure has been scheduled as follows:  Date:  03/22/2014                    Time:  11:15 AM Location: [facility] Short Stay  This Gastroenterology Pre-Precedure Review Form is being routed to the following provider(s): Barney Drain, MD

## 2014-03-04 NOTE — Telephone Encounter (Signed)
MOVI PREP SPLIT DOSING- full LIQUIDS WITH BREAKFAST.

## 2014-03-05 ENCOUNTER — Encounter (HOSPITAL_COMMUNITY): Payer: Self-pay | Admitting: Pharmacy Technician

## 2014-03-05 MED ORDER — PEG-KCL-NACL-NASULF-NA ASC-C 100 G PO SOLR: 1.0000 | ORAL | Status: DC

## 2014-03-05 NOTE — Telephone Encounter (Signed)
Rx sent to the pharmacy and instructions mailed to pt.  

## 2014-03-05 NOTE — Addendum Note (Signed)
Addended by: Everardo All on: 03/05/2014 10:30 AM   Modules accepted: Orders

## 2014-03-19 ENCOUNTER — Telehealth: Payer: Self-pay

## 2014-03-19 NOTE — Telephone Encounter (Signed)
I called [employer] [place] at [phone] and spoke to [place] who said that a PA is not required for a screening colonoscopy.

## 2014-03-22 ENCOUNTER — Ambulatory Visit (HOSPITAL_COMMUNITY)
Admission: RE | Admit: 2014-03-22 | Discharge: 2014-03-22 | Disposition: A | Discharge status: Home / Self Care | Payer: BC Managed Care – PPO | Source: Ambulatory Visit | Attending: Gastroenterology | Admitting: Gastroenterology

## 2014-03-22 ENCOUNTER — Encounter (HOSPITAL_COMMUNITY)
Admission: RE | Disposition: A | Discharge status: Home / Self Care | Payer: Self-pay | Source: Ambulatory Visit | Attending: Gastroenterology

## 2014-03-22 ENCOUNTER — Encounter (HOSPITAL_COMMUNITY): Payer: Self-pay | Admitting: *Deleted

## 2014-03-22 ENCOUNTER — Telehealth: Payer: Self-pay | Admitting: Gastroenterology

## 2014-03-22 DIAGNOSIS — I2510 Atherosclerotic heart disease of native coronary artery without angina pectoris: Secondary | ICD-10-CM | POA: Insufficient documentation

## 2014-03-22 DIAGNOSIS — I428 Other cardiomyopathies: Secondary | ICD-10-CM | POA: Insufficient documentation

## 2014-03-22 DIAGNOSIS — G4733 Obstructive sleep apnea (adult) (pediatric): Secondary | ICD-10-CM | POA: Insufficient documentation

## 2014-03-22 DIAGNOSIS — K648 Other hemorrhoids: Secondary | ICD-10-CM | POA: Insufficient documentation

## 2014-03-22 DIAGNOSIS — F17200 Nicotine dependence, unspecified, uncomplicated: Secondary | ICD-10-CM | POA: Insufficient documentation

## 2014-03-22 DIAGNOSIS — Z1211 Encounter for screening for malignant neoplasm of colon: Secondary | ICD-10-CM

## 2014-03-22 DIAGNOSIS — I509 Heart failure, unspecified: Secondary | ICD-10-CM | POA: Insufficient documentation

## 2014-03-22 DIAGNOSIS — I10 Essential (primary) hypertension: Secondary | ICD-10-CM | POA: Insufficient documentation

## 2014-03-22 DIAGNOSIS — Z7982 Long term (current) use of aspirin: Secondary | ICD-10-CM | POA: Insufficient documentation

## 2014-03-22 DIAGNOSIS — D126 Benign neoplasm of colon, unspecified: Secondary | ICD-10-CM | POA: Insufficient documentation

## 2014-03-22 DIAGNOSIS — Z79899 Other long term (current) drug therapy: Secondary | ICD-10-CM | POA: Insufficient documentation

## 2014-03-22 DIAGNOSIS — Q438 Other specified congenital malformations of intestine: Secondary | ICD-10-CM | POA: Insufficient documentation

## 2014-03-22 HISTORY — DX: Pure hypercholesterolemia, unspecified: E78.00

## 2014-03-22 HISTORY — PX: COLONOSCOPY: SHX5424

## 2014-03-22 SURGERY — COLONOSCOPY
Anesthesia: Moderate Sedation

## 2014-03-22 MED ORDER — MIDAZOLAM HCL 5 MG/5ML IJ SOLN
INTRAMUSCULAR | Status: AC
  Filled 2014-03-22: qty 10

## 2014-03-22 MED ORDER — STERILE WATER FOR IRRIGATION IR SOLN
Status: DC | PRN
  Administered 2014-03-22: 13:00:00

## 2014-03-22 MED ORDER — MEPERIDINE HCL 100 MG/ML IJ SOLN
INTRAMUSCULAR | Status: AC
  Filled 2014-03-22: qty 2

## 2014-03-22 MED ORDER — MEPERIDINE HCL 100 MG/ML IJ SOLN
INTRAMUSCULAR | Status: DC | PRN
  Administered 2014-03-22 (×3): 25 mg via INTRAVENOUS

## 2014-03-22 MED ORDER — SODIUM CHLORIDE 0.9 % IV SOLN
INTRAVENOUS | Status: DC
  Administered 2014-03-22: 12:00:00 via INTRAVENOUS

## 2014-03-22 MED ORDER — MIDAZOLAM HCL 5 MG/5ML IJ SOLN
INTRAMUSCULAR | Status: DC | PRN
  Administered 2014-03-22: 2 mg via INTRAVENOUS

## 2014-03-22 MED ORDER — MIDAZOLAM HCL 5 MG/5ML IJ SOLN
INTRAMUSCULAR | Status: DC | PRN
  Administered 2014-03-22 (×3): 2 mg via INTRAVENOUS

## 2014-03-22 NOTE — Telephone Encounter (Signed)
Tammy from Short Stay called to let us know that [facility] wanted patient to have next tcs in 1-3 months to remove polyp and to have this set up at [facility].

## 2014-03-22 NOTE — H&P (Signed)
Primary Care Physician:  Rosita Fire, MD Primary Gastroenterologist:  Dr. Oneida Alar  Pre-Procedure History & Physical: HPI:  James [name] is a 53 y.o. male here for COLON CANCER SCREENING.  Past Medical History  Diagnosis Date  . Arteriosclerotic cardiovascular disease (ASCVD)     Insignificant disease in 2002 and 2013  . Hypertension   . Tobacco abuse   . Obstructive sleep apnea   . Cardiomyopathy     CHF and EF of 25% in 02/2012; normal TSH  . Cholelithiasis     Past Surgical History  Procedure Laterality Date  . Cardiac catheterization  2002    Prior to Admission medications   Medication Sig Start Date End Date Taking? Authorizing Provider  aspirin 81 MG tablet Take 81 mg by mouth daily.     Yes Historical Provider, MD  carvedilol (COREG) 25 MG tablet Take 2 tablets (50 mg total) by mouth 2 (two) times daily. 03/31/13 03/31/14 Yes Yehuda Savannah, MD  furosemide (LASIX) 40 MG tablet TAKE 1 TABLET (40 MG TOTAL) BY MOUTH EVERY MORNING. 08/02/13  Yes Arnoldo Lenis, MD  hydrALAZINE (APRESOLINE) 100 MG tablet Take 1 tablet (100 mg total) by mouth 3 (three) times daily. 08/14/12  Yes Yehuda Savannah, MD  isosorbide mononitrate (IMDUR) 120 MG 24 hr tablet TAKE 1 TABLET BY MOUTH EVERY DAY 01/18/14  Yes Lendon Colonel, NP  lisinopril (PRINIVIL,ZESTRIL) 40 MG tablet TAKE 1 TABLET BY MOUTH EVERY DAY 01/18/14  Yes Lendon Colonel, NP  loratadine (CLARITIN) 10 MG tablet Take 10 mg by mouth daily.   Yes Historical Provider, MD  pantoprazole (PROTONIX) 40 MG tablet TAKE 1 TABLET (40 MG TOTAL) BY MOUTH DAILY. 03/22/13  Yes Lendon Colonel, NP  pravastatin (PRAVACHOL) 40 MG tablet Take 1 tablet (40 mg total) by mouth every evening. 10/31/12  Yes Yehuda Savannah, MD  spironolactone (ALDACTONE) 25 MG tablet TAKE 1 TABLET (25 MG TOTAL) BY MOUTH DAILY. 08/02/13  Yes Arnoldo Lenis, MD  peg 3350 powder (MOVIPREP) 100 G SOLR Take 1 kit (200 g total) by mouth as directed. 03/05/14    Danie Binder, MD    Allergies as of 03/03/2014  . (No Known Allergies)    Family History  Problem Relation Age of Onset  . Heart attack Father   . Hypertension Father   . Heart disease Maternal Grandfather     Also paternal grandfather  . Coronary artery disease Mother   . Iron deficiency Father   . Cardiomyopathy Brother     No significant coronary disease by coronary angiography  . Thyroid disease Sister   . Sudden death Other     History   Social History  . Marital Status: Single    Spouse Name: N/A    Number of Children: N/A  . Years of Education: N/A   Occupational History  . Full time, [employer]    Social History Main Topics  . Smoking status: Current Some Day Smoker  . Smokeless tobacco: Not on file     Comment: 3 cigaretts a day  . Alcohol Use: No  . Drug Use: No  . Sexual Activity: Not on file   Other Topics Concern  . Not on file   Social History Narrative   Divorced   No regular exercise    Review of Systems: See HPI, otherwise negative ROS   Physical Exam: There were no vitals taken for this visit. General:   Alert,  pleasant and cooperative  in NAD Head:  Normocephalic and atraumatic. Neck:  Supple; Lungs:  Clear throughout to auscultation.    Heart:  Regular rate and rhythm. Abdomen:  Soft, nontender and nondistended. Normal bowel sounds, without guarding, and without rebound.   Neurologic:  Alert and  oriented x4;  grossly normal neurologically.  Impression/Plan:     SCREENING  Plan:  1. TCS TODAY

## 2014-03-22 NOTE — Telephone Encounter (Signed)
REVIEWED. Will schedule APPT FOR TCS/EMR AFTER BX RESULTS FINAL.

## 2014-03-22 NOTE — Op Note (Signed)
[facility] [address] [place], [address]   COLONOSCOPY PROCEDURE REPORT  PATIENT: James [name], James [name]  MR#: [id number] BIRTHDATE: 1961/08/20 , 72  yrs. old GENDER: Male ENDOSCOPIST: Barney Drain, MD REFERRED MV:[id number] Fanta, M.D. PROCEDURE DATE:  03/22/2014 PROCEDURE:   Colonoscopy with snare polypectomy, with cold biopsy polypectomy, and Submucosal injection(3 cc SPOT) INDICATIONS:Average risk patient for colon cancer. MEDICATIONS: Demerol 75 mg IV and Versed 8 mg IV  DESCRIPTION OF PROCEDURE:    Physical exam was performed.  Informed consent was obtained from the patient after explaining the benefits, risks, and alternatives to procedure.  The patient was connected to monitor and placed in left lateral position. Continuous oxygen was provided by nasal cannula and IV medicine administered through an indwelling cannula.  After administration of sedation and rectal exam, the patients rectum was intubated and the EC-3890Li ([id number])  colonoscope was advanced under direct visualization to the ileum.  The scope was removed slowly by carefully examining the color, texture, anatomy, and integrity mucosa on the way out.  The patient was recovered in endoscopy and discharged home in satisfactory condition.    COLON FINDINGS: Six sessile polyps measuring 4-6 mm in size were found in the transverse colon, descending colon, and rectosigmoid colon.  A polypectomy was performed with cold forceps. Two flat polyps measuring 6-8 mm in size were found in the sigmoid colon.  A polypectomy was performed using snare cautery.  A flat polyp measuring 12 mm in size was found in the sigmoid colon.  Multiple biopsies were performed using a hot snare.  A tattoo was applied. LESIONS NOT REMOVED DUE TO RISK OF PERFORATION. 2 ADDITIONAL FLAT POLYP SEEN IN JUST PROXIMAL TO THE LARGE POLYP., The colon mucosa was otherwise normal.  , Moderate sized internal hemorrhoids were found.  ,  and The LEFT colon IS redundant.  Manual abdominal counter-pressure was used to reach the cecum.  PREP QUALITY: good. CECAL W/D TIME: 32 minutes     COMPLICATIONS: None  ENDOSCOPIC IMPRESSION: 1.   9 POLYPS REMOVED 2.   ONE Flat polyp  in the sigmoid colonAND TWO SMALL FLAT POLYPS REMAIN. 3.   Moderate sized internal hemorrhoids 4.   The LEFT colon IS redundant  RECOMMENDATIONS: FOLLOW A HIGH FIBER DIET. BIOPSY RESULTS SHOULD BE BACK IN 7 DAYS.  NEXT colonoscopy IN 1-3 MOS.       _______________________________ Lorrin MaisBarney Drain, MD 03/22/2014 5:20 PM

## 2014-03-22 NOTE — Discharge Instructions (Signed)
You had 9 small polyps and 1 larger polyp partially removed. You have small internal hemorrhoids.   FOLLOW A HIGH FIBER DIET. SEE INFO BELOW.  YOUR BIOPSY RESULTS SHOULD BE BACK IN 7 DAYS.  NEXT TCS IN 1-3 MOS.  Colonoscopy Care After Read the instructions outlined below and refer to this sheet in the next week. These discharge instructions provide you with general information on caring for yourself after you leave the hospital. While your treatment has been planned according to the most current medical practices available, unavoidable complications occasionally occur. If you have any problems or questions after discharge, call DR. Jeremyah Jelley, [phone].  ACTIVITY  You may resume your regular activity, but move at a slower pace for the next 24 hours.   Take frequent rest periods for the next 24 hours.   Walking will help get rid of the air and reduce the bloated feeling in your belly (abdomen).   No driving for 24 hours (because of the medicine (anesthesia) used during the test).   You may shower.   Do not sign any important legal documents or operate any machinery for 24 hours (because of the anesthesia used during the test).    NUTRITION  Drink plenty of fluids.   You may resume your normal diet as instructed by your doctor.   Begin with a light meal and progress to your normal diet. Heavy or fried foods are harder to digest and may make you feel sick to your stomach (nauseated).   Avoid alcoholic beverages for 24 hours or as instructed.    MEDICATIONS  You may resume your normal medications.   WHAT YOU CAN EXPECT TODAY  Some feelings of bloating in the abdomen.   Passage of more gas than usual.   Spotting of blood in your stool or on the toilet paper  .  IF YOU HAD POLYPS REMOVED DURING THE COLONOSCOPY:  Eat a soft diet IF YOU HAVE NAUSEA, BLOATING, ABDOMINAL PAIN, OR VOMITING.    FINDING OUT THE RESULTS OF YOUR TEST Not all test results are available  during your visit. DR. Oneida Alar WILL CALL YOU WITHIN 7 DAYS OF YOUR PROCEDUE WITH YOUR RESULTS. Do not assume everything is normal if you have not heard from DR. Detria Cummings IN ONE WEEK, CALL HER OFFICE AT [phone].  SEEK IMMEDIATE MEDICAL ATTENTION AND CALL THE OFFICE: [phone] IF:  You have more than a spotting of blood in your stool.   Your belly is swollen (abdominal distention).   You are nauseated or vomiting.   You have a temperature over 101F.   You have abdominal pain or discomfort that is severe or gets worse throughout the day.   High-Fiber Diet A high-fiber diet changes your normal diet to include more whole grains, legumes, fruits, and vegetables. Changes in the diet involve replacing refined carbohydrates with unrefined foods. The calorie level of the diet is essentially unchanged. The Dietary Reference Intake (recommended amount) for adult males is 38 grams per day. For adult females, it is 25 grams per day. Pregnant and lactating women should consume 28 grams of fiber per day. Fiber is the intact part of a plant that is not broken down during digestion. Functional fiber is fiber that has been isolated from the plant to provide a beneficial effect in the body. PURPOSE  Increase stool bulk.   Ease and regulate bowel movements.   Lower cholesterol.  INDICATIONS THAT YOU NEED MORE FIBER  Constipation and hemorrhoids.   Uncomplicated diverticulosis (intestine condition)  and irritable bowel syndrome.   Weight management.   As a protective measure against hardening of the arteries (atherosclerosis), diabetes, and cancer.   GUIDELINES FOR INCREASING FIBER IN THE DIET  Start adding fiber to the diet slowly. A gradual increase of about 5 more grams (2 slices of whole-wheat bread, 2 servings of most fruits or vegetables, or 1 bowl of high-fiber cereal) per day is best. Too rapid an increase in fiber may result in constipation, flatulence, and bloating.   Drink enough  water and fluids to keep your urine clear or pale yellow. Water, juice, or caffeine-free drinks are recommended. Not drinking enough fluid may cause constipation.   Eat a variety of high-fiber foods rather than one type of fiber.   Try to increase your intake of fiber through using high-fiber foods rather than fiber pills or supplements that contain small amounts of fiber.   The goal is to change the types of food eaten. Do not supplement your present diet with high-fiber foods, but replace foods in your present diet.  INCLUDE A VARIETY OF FIBER SOURCES  Replace refined and processed grains with whole grains, canned fruits with fresh fruits, and incorporate other fiber sources. White rice, white breads, and most bakery goods contain little or no fiber.   Ewan whole-grain rice, buckwheat oats, and many fruits and vegetables are all good sources of fiber. These include: broccoli, Brussels sprouts, cabbage, cauliflower, beets, sweet potatoes, white potatoes (skin on), carrots, tomatoes, eggplant, squash, berries, fresh fruits, and dried fruits.   Cereals appear to be the richest source of fiber. Cereal fiber is found in whole grains and bran. Bran is the fiber-rich outer coat of cereal grain, which is largely removed in refining. In whole-grain cereals, the bran remains. In breakfast cereals, the largest amount of fiber is found in those with "bran" in their names. The fiber content is sometimes indicated on the label.   You may need to include additional fruits and vegetables each day.   In baking, for 1 cup white flour, you may use the following substitutions:   1 cup whole-wheat flour minus 2 tablespoons.   1/2 cup white flour plus 1/2 cup whole-wheat flour.   Polyps, Colon  A polyp is extra tissue that grows inside your body. Colon polyps grow in the large intestine. The large intestine, also called the colon, is part of your digestive system. It is a long, hollow tube at the end of your  digestive tract where your body makes and stores stool. Most polyps are not dangerous. They are benign. This means they are not cancerous. But over time, some types of polyps can turn into cancer. Polyps that are smaller than a pea are usually not harmful. But larger polyps could someday become or may already be cancerous. To be safe, doctors remove all polyps and test them.   WHO GETS POLYPS? Anyone can get polyps, but certain people are more likely than others. You may have a greater chance of getting polyps if:  You are over 50.   You have had polyps before.   Someone in your family has had polyps.   Someone in your family has had cancer of the large intestine.   Find out if someone in your family has had polyps. You may also be more likely to get polyps if you:   Eat a lot of fatty foods   Smoke   Drink alcohol   Do not exercise  Eat too much   TREATMENT  The caregiver will remove the polyp during sigmoidoscopy or colonoscopy.    PREVENTION There is not one sure way to prevent polyps. You might be able to lower your risk of getting them if you:  Eat more fruits and vegetables and less fatty food.   Do not smoke.   Avoid alcohol.   Exercise every day.   Lose weight if you are overweight.   Eating more calcium and folate can also lower your risk of getting polyps. Some foods that are rich in calcium are milk, cheese, and broccoli. Some foods that are rich in folate are chickpeas, kidney beans, and spinach.   Hemorrhoids Hemorrhoids are dilated (enlarged) veins around the rectum. Sometimes clots will form in the veins. This makes them swollen and painful. These are called thrombosed hemorrhoids. Causes of hemorrhoids include:  Constipation.   Straining to have a bowel movement.   HEAVY LIFTING HOME CARE INSTRUCTIONS  Eat a well balanced diet and drink 6 to 8 glasses of water every day to avoid constipation. You may also use a bulk laxative.   Avoid straining to  have bowel movements.   Keep anal area dry and clean.   Do not use a donut shaped pillow or sit on the toilet for long periods. This increases blood pooling and pain.   Move your bowels when your body has the urge; this will require less straining and will decrease pain and pressure.

## 2014-03-23 NOTE — Telephone Encounter (Signed)
Noted  

## 2014-03-25 ENCOUNTER — Encounter (HOSPITAL_COMMUNITY): Payer: Self-pay | Admitting: Gastroenterology

## 2014-04-01 ENCOUNTER — Telehealth: Payer: Self-pay | Admitting: *Deleted

## 2014-04-01 NOTE — Telephone Encounter (Signed)
Called patient TO DISCUSS RESULTS. GOT DISCONNECTED. CALLED AGAIN-SPOKE WITH [name]. He had ONE simple adenoma AND THE REST WERE HYPERPLASTIC/BENIGN POLYPOID TISSUE removed. PART OF THE FLAT POLYP REMAINS WITH 2 POLYPOID LESIONS IN THE SAME AREA SO HE STILL HAS 3 POLYPS TO BE REMOVED FORM HIS SIGMOID COLON. FOLLOW A High fiber diet. TCS IN 1-2 MOS AT [facility] FOR EMR/POLYPECTOMY.  NEXT TCS AT [facility] IN 5 YEARS.

## 2014-04-01 NOTE — Telephone Encounter (Signed)
Male called for pt wanting to get pt's test results. Please advise [phone].

## 2014-04-02 NOTE — Telephone Encounter (Signed)
Referral has been sent to [facility] for TCS

## 2014-04-06 ENCOUNTER — Encounter: Payer: Self-pay | Admitting: *Deleted

## 2014-05-26 NOTE — Telephone Encounter (Signed)
Patient is scheduled with Dr. Arsenio Loader September 4th, 2015 at [facility]

## 2014-06-18 DIAGNOSIS — Z9889 Other specified postprocedural states: Secondary | ICD-10-CM | POA: Insufficient documentation

## 2014-06-19 DIAGNOSIS — K631 Perforation of intestine (nontraumatic): Secondary | ICD-10-CM | POA: Insufficient documentation

## 2014-08-06 ENCOUNTER — Telehealth: Payer: Self-pay | Admitting: *Deleted

## 2014-08-06 MED ORDER — SPIRONOLACTONE 25 MG PO TABS: 25.0000 mg | ORAL_TABLET | Freq: Every day | ORAL | Status: DC

## 2014-08-06 NOTE — Telephone Encounter (Signed)
[facility] FAXED   SPIRONOLACTONE 25 MG #90

## 2014-08-06 NOTE — Telephone Encounter (Signed)
Refill complete 

## 2014-08-16 ENCOUNTER — Encounter: Payer: Self-pay | Admitting: Cardiovascular Disease

## 2014-08-16 ENCOUNTER — Ambulatory Visit (INDEPENDENT_AMBULATORY_CARE_PROVIDER_SITE_OTHER): Payer: BC Managed Care – PPO | Admitting: Cardiovascular Disease

## 2014-08-16 VITALS — BP 152/82 | HR 71 | Ht 78.0 in | Wt 292.0 lb

## 2014-08-16 DIAGNOSIS — I77810 Thoracic aortic ectasia: Secondary | ICD-10-CM

## 2014-08-16 DIAGNOSIS — Z716 Tobacco abuse counseling: Secondary | ICD-10-CM

## 2014-08-16 DIAGNOSIS — I428 Other cardiomyopathies: Secondary | ICD-10-CM

## 2014-08-16 DIAGNOSIS — G4733 Obstructive sleep apnea (adult) (pediatric): Secondary | ICD-10-CM

## 2014-08-16 DIAGNOSIS — I5022 Chronic systolic (congestive) heart failure: Secondary | ICD-10-CM

## 2014-08-16 DIAGNOSIS — I429 Cardiomyopathy, unspecified: Secondary | ICD-10-CM

## 2014-08-16 DIAGNOSIS — E785 Hyperlipidemia, unspecified: Secondary | ICD-10-CM

## 2014-08-16 DIAGNOSIS — I10 Essential (primary) hypertension: Secondary | ICD-10-CM

## 2014-08-16 NOTE — Progress Notes (Signed)
Patient ID: James [name], male   DOB: 30-May-1961, 53 y.o.   MRN: [id number]      SUBJECTIVE: The patient is a 53 year old male with a history of a nonischemic cardiomyopathy, aortic root dilatation (46 mm by echo in 07/2012), tobacco use, sleep apnea, hypertension, and hyperlipidemia. This is my first time meeting him. He had minimal nonobstructive coronary artery disease by coronary angiography in June 2013 with a 20% RCA lesion. Echocardiography in October 2013 demonstrated mildly reduced left ventricular systolic function, EF 40%, with grade I diastolic dysfunction. He has been feeling well and denies chest pain, shortness of breath, palpitations, orthopnea, PND, and leg swelling. He just got off work and has not taken his medications yet. He quit smoking for two weeks and started again, and has cut back from 10 down to 5 cigarettes daily, and wants to quit. He does not use CPAP. He has had his blood pressure checked at other office visits and procedures this past year and it is always been normal. ECG performed in the office today demonstrates normal sinus rhythm with a diffuse T-wave abnormality.    Review of Systems: As per "subjective", otherwise negative.  No Known Allergies  Current Outpatient Prescriptions  Medication Sig Dispense Refill  . carvedilol (COREG) 25 MG tablet Take 2 tablets (50 mg total) by mouth 2 (two) times daily. 120 tablet 3  . furosemide (LASIX) 40 MG tablet TAKE 1 TABLET (40 MG TOTAL) BY MOUTH EVERY MORNING. 30 tablet 6  . hydrALAZINE (APRESOLINE) 100 MG tablet Take 1 tablet (100 mg total) by mouth 3 (three) times daily. 90 tablet 6  . isosorbide mononitrate (IMDUR) 120 MG 24 hr tablet TAKE 1 TABLET BY MOUTH EVERY DAY 30 tablet 1  . lisinopril (PRINIVIL,ZESTRIL) 40 MG tablet TAKE 1 TABLET BY MOUTH EVERY DAY 30 tablet 1  . loratadine (CLARITIN) 10 MG tablet Take 10 mg by mouth daily.    . pantoprazole (PROTONIX) 40 MG tablet TAKE 1 TABLET (40 MG TOTAL) BY  MOUTH DAILY. 30 tablet 9  . pravastatin (PRAVACHOL) 40 MG tablet Take 1 tablet (40 mg total) by mouth every evening. 90 tablet 3  . spironolactone (ALDACTONE) 25 MG tablet Take 1 tablet (25 mg total) by mouth daily. 90 tablet 0   No current facility-administered medications for this visit.    Past Medical History  Diagnosis Date  . Arteriosclerotic cardiovascular disease (ASCVD)     Insignificant disease in 2002 and 2013  . Hypertension   . Tobacco abuse   . Obstructive sleep apnea   . Cardiomyopathy     CHF and EF of 25% in 02/2012; normal TSH  . Hypercholesteremia     Past Surgical History  Procedure Laterality Date  . Cardiac catheterization  2002  . Colonoscopy N/A 03/22/2014    Procedure: COLONOSCOPY;  Surgeon: Danie Binder, MD;  Location: [facility];  Service: Endoscopy;  Laterality: N/A;  11:15 AM    History   Social History  . Marital Status: Single    Spouse Name: N/A    Number of Children: N/A  . Years of Education: N/A   Occupational History  . Full time, [employer]    Social History Main Topics  . Smoking status: Current Every Day Smoker -- 0.50 packs/day for 32 years    Types: Cigarettes    Start date: 10/15/1982  . Smokeless tobacco: Not on file     Comment: 3 cigaretts a day  . Alcohol Use: No  .  Drug Use: No  . Sexual Activity: Not on file   Other Topics Concern  . Not on file   Social History Narrative   Divorced   No regular exercise     Filed Vitals:   08/16/14 1444  Height: 6\' 6"  (1.981 m)  Weight: 292 lb (132.45 kg)   BP 152/82  Pulse 71  PHYSICAL EXAM General: NAD HEENT: Normal. Neck: No JVD, no thyromegaly. Lungs: Clear to auscultation bilaterally with normal respiratory effort. CV: Nondisplaced PMI.  Regular rate and rhythm, normal S1/S2, no S3/S4, no murmur. No pretibial or periankle edema.  No carotid bruit.  Normal pedal pulses.  Abdomen: Soft, nontender, no hepatosplenomegaly, no distention.  Neurologic: Alert and  oriented x 3.  Psych: Normal affect. Skin: Normal. Musculoskeletal: Normal range of motion, no gross deformities. Extremities: No clubbing or cyanosis.   ECG: Most recent ECG reviewed.      ASSESSMENT AND PLAN: 1. Nonischemic cardiomyopathy: LVEF 45% in 07/2012. No evidence of heart failure. No changes to therapy which includes Coreg, ACE inhibitor, hydralazine, nitrates, and spironolactone. 2. Essential HTN: Elevated on current therapy, but has reportedly been normal. Continue to monitor. 3. Hyperlipidemia: Currently on statin therapy. 4. Aortic root dilatation: Will reassess by echocardiogram. 5. Tobacco use: Cessation counseling provided. 6. Sleep apnea: CPAP use encouraged.  Dispo: f/u 1 year.  Kate Sable, M.D., F.A.C.C.

## 2014-08-16 NOTE — Patient Instructions (Signed)
Your physician wants you to follow-up in: 1 year You will receive a reminder letter in the mail two months in advance. If you don't receive a letter, please call our office to schedule the follow-up appointment.     Your physician recommends that you continue on your current medications as directed. Please refer to the Current Medication list given to you today.     Your physician has requested that you have an echocardiogram. Echocardiography is a painless test that uses sound waves to create images of your heart. It provides your doctor with information about the size and shape of your heart and how well your heart's chambers and valves are working. This procedure takes approximately one hour. There are no restrictions for this procedure.      Thank you for choosing [facility] !        

## 2014-08-23 ENCOUNTER — Ambulatory Visit (HOSPITAL_COMMUNITY)
Admission: RE | Admit: 2014-08-23 | Discharge: 2014-08-23 | Disposition: A | Discharge status: Home / Self Care | Payer: BC Managed Care – PPO | Source: Ambulatory Visit | Attending: Cardiovascular Disease | Admitting: Cardiovascular Disease

## 2014-08-23 DIAGNOSIS — Z720 Tobacco use: Secondary | ICD-10-CM | POA: Insufficient documentation

## 2014-08-23 DIAGNOSIS — I2510 Atherosclerotic heart disease of native coronary artery without angina pectoris: Secondary | ICD-10-CM | POA: Diagnosis present

## 2014-08-23 DIAGNOSIS — I77810 Thoracic aortic ectasia: Secondary | ICD-10-CM

## 2014-08-23 DIAGNOSIS — G4733 Obstructive sleep apnea (adult) (pediatric): Secondary | ICD-10-CM | POA: Insufficient documentation

## 2014-08-23 DIAGNOSIS — E785 Hyperlipidemia, unspecified: Secondary | ICD-10-CM | POA: Diagnosis not present

## 2014-08-23 DIAGNOSIS — I5022 Chronic systolic (congestive) heart failure: Secondary | ICD-10-CM

## 2014-08-23 DIAGNOSIS — I509 Heart failure, unspecified: Secondary | ICD-10-CM | POA: Insufficient documentation

## 2014-08-23 DIAGNOSIS — I081 Rheumatic disorders of both mitral and tricuspid valves: Secondary | ICD-10-CM | POA: Diagnosis not present

## 2014-08-23 DIAGNOSIS — I517 Cardiomegaly: Secondary | ICD-10-CM

## 2014-08-23 NOTE — Progress Notes (Signed)
  Echocardiogram 2D Echocardiogram has been performed.  [place], [place] 08/23/2014, 3:07 PM

## 2014-10-31 ENCOUNTER — Other Ambulatory Visit: Payer: Self-pay | Admitting: Adult Health

## 2014-11-01 NOTE — Telephone Encounter (Signed)
ecribed rx complete

## 2015-05-11 ENCOUNTER — Other Ambulatory Visit: Payer: Self-pay

## 2015-05-11 MED ORDER — HYDRALAZINE HCL 100 MG PO TABS: 100.0000 mg | ORAL_TABLET | Freq: Three times a day (TID) | ORAL | Status: DC

## 2015-05-11 NOTE — Telephone Encounter (Signed)
Refill complete 

## 2015-06-03 ENCOUNTER — Other Ambulatory Visit: Payer: Self-pay | Admitting: Cardiology

## 2015-11-13 ENCOUNTER — Other Ambulatory Visit: Payer: Self-pay | Admitting: Cardiovascular Disease

## 2015-11-23 ENCOUNTER — Other Ambulatory Visit: Payer: Self-pay | Admitting: Cardiovascular Disease

## 2016-05-13 ENCOUNTER — Emergency Department (HOSPITAL_COMMUNITY): Payer: BLUE CROSS/BLUE SHIELD

## 2016-05-13 ENCOUNTER — Emergency Department (HOSPITAL_COMMUNITY)
Admission: EM | Admit: 2016-05-13 | Discharge: 2016-05-13 | Disposition: A | Discharge status: Home / Self Care | Payer: BLUE CROSS/BLUE SHIELD | Attending: Emergency Medicine | Admitting: Emergency Medicine

## 2016-05-13 ENCOUNTER — Encounter (HOSPITAL_COMMUNITY): Payer: Self-pay

## 2016-05-13 DIAGNOSIS — J40 Bronchitis, not specified as acute or chronic: Secondary | ICD-10-CM | POA: Insufficient documentation

## 2016-05-13 DIAGNOSIS — Z7982 Long term (current) use of aspirin: Secondary | ICD-10-CM | POA: Diagnosis not present

## 2016-05-13 DIAGNOSIS — J9801 Acute bronchospasm: Secondary | ICD-10-CM | POA: Diagnosis not present

## 2016-05-13 DIAGNOSIS — I110 Hypertensive heart disease with heart failure: Secondary | ICD-10-CM | POA: Diagnosis not present

## 2016-05-13 DIAGNOSIS — F17210 Nicotine dependence, cigarettes, uncomplicated: Secondary | ICD-10-CM | POA: Diagnosis not present

## 2016-05-13 DIAGNOSIS — Z79899 Other long term (current) drug therapy: Secondary | ICD-10-CM | POA: Diagnosis not present

## 2016-05-13 DIAGNOSIS — I509 Heart failure, unspecified: Secondary | ICD-10-CM | POA: Insufficient documentation

## 2016-05-13 DIAGNOSIS — R0789 Other chest pain: Secondary | ICD-10-CM

## 2016-05-13 LAB — I-STAT CHEM 8, ED
BUN: 14 mg/dL (ref 6–20)
Calcium, Ion: 1.13 mmol/L (ref 1.13–1.30)
Chloride: 101 mmol/L (ref 101–111)
Creatinine, Ser: 1.1 mg/dL (ref 0.61–1.24)
GLUCOSE: 132 mg/dL — AB (ref 65–99)
HCT: 49 % (ref 39.0–52.0)
HEMOGLOBIN: 16.7 g/dL (ref 13.0–17.0)
Potassium: 3.7 mmol/L (ref 3.5–5.1)
Sodium: 140 mmol/L (ref 135–145)
TCO2: 30 mmol/L (ref 0–100)

## 2016-05-13 LAB — I-STAT TROPONIN, ED: TROPONIN I, POC: 0.05 ng/mL (ref 0.00–0.08)

## 2016-05-13 LAB — CBC
HCT: 46.9 % (ref 39.0–52.0)
Hemoglobin: 16.5 g/dL (ref 13.0–17.0)
MCH: 31.8 pg (ref 26.0–34.0)
MCHC: 35.2 g/dL (ref 30.0–36.0)
MCV: 90.4 fL (ref 78.0–100.0)
PLATELETS: 192 10*3/uL (ref 150–400)
RBC: 5.19 MIL/uL (ref 4.22–5.81)
RDW: 12.8 % (ref 11.5–15.5)
WBC: 11.3 10*3/uL — ABNORMAL HIGH (ref 4.0–10.5)

## 2016-05-13 LAB — BASIC METABOLIC PANEL
ANION GAP: 4 — AB (ref 5–15)
BUN: 14 mg/dL (ref 6–20)
CALCIUM: 8.7 mg/dL — AB (ref 8.9–10.3)
CO2: 26 mmol/L (ref 22–32)
CREATININE: 0.99 mg/dL (ref 0.61–1.24)
Chloride: 106 mmol/L (ref 101–111)
GFR calc non Af Amer: >60 mL/min (ref >60)
Glucose, Bld: 135 mg/dL — ABNORMAL HIGH (ref 65–99)
Potassium: 3.6 mmol/L (ref 3.5–5.1)
Sodium: 136 mmol/L (ref 135–145)

## 2016-05-13 LAB — BRAIN NATRIURETIC PEPTIDE: B NATRIURETIC PEPTIDE 5: 425 pg/mL — AB (ref 0.0–100.0)

## 2016-05-13 MED ORDER — ALBUTEROL SULFATE (2.5 MG/3ML) 0.083% IN NEBU
2.5000 mg | INHALATION_SOLUTION | Freq: Once | RESPIRATORY_TRACT | Status: AC
  Administered 2016-05-13: 2.5 mg via RESPIRATORY_TRACT
  Filled 2016-05-13: qty 3

## 2016-05-13 MED ORDER — PREDNISONE 20 MG PO TABS: ORAL_TABLET | ORAL | 0 refills | Status: DC

## 2016-05-13 MED ORDER — IPRATROPIUM BROMIDE 0.02 % IN SOLN: 0.5000 mg | Freq: Once | RESPIRATORY_TRACT | Status: DC

## 2016-05-13 MED ORDER — ALBUTEROL SULFATE HFA 108 (90 BASE) MCG/ACT IN AERS
2.0000 | INHALATION_SPRAY | Freq: Four times a day (QID) | RESPIRATORY_TRACT | Status: DC | PRN
  Administered 2016-05-13: 2 via RESPIRATORY_TRACT
  Filled 2016-05-13: qty 6.7

## 2016-05-13 MED ORDER — ALBUTEROL SULFATE (2.5 MG/3ML) 0.083% IN NEBU: 5.0000 mg | INHALATION_SOLUTION | Freq: Once | RESPIRATORY_TRACT | Status: DC

## 2016-05-13 MED ORDER — AZITHROMYCIN 250 MG PO TABS: 250.0000 mg | ORAL_TABLET | Freq: Every day | ORAL | 0 refills | Status: DC

## 2016-05-13 MED ORDER — IPRATROPIUM-ALBUTEROL 0.5-2.5 (3) MG/3ML IN SOLN
3.0000 mL | Freq: Once | RESPIRATORY_TRACT | Status: AC
  Administered 2016-05-13: 3 mL via RESPIRATORY_TRACT
  Filled 2016-05-13: qty 3

## 2016-05-13 MED ORDER — AEROCHAMBER Z-STAT PLUS/MEDIUM MISC: 1.0000 | Freq: Once | Status: DC

## 2016-05-13 MED ORDER — PREDNISONE 50 MG PO TABS
60.0000 mg | ORAL_TABLET | Freq: Once | ORAL | Status: AC
  Administered 2016-05-13: 60 mg via ORAL
  Filled 2016-05-13: qty 1

## 2016-05-13 MED ORDER — KETOROLAC TROMETHAMINE 30 MG/ML IJ SOLN
30.0000 mg | Freq: Once | INTRAMUSCULAR | Status: AC
  Administered 2016-05-13: 30 mg via INTRAVENOUS
  Filled 2016-05-13: qty 1

## 2016-05-13 NOTE — ED Notes (Signed)
Patient transported to CT 

## 2016-05-13 NOTE — ED Triage Notes (Signed)
Pt c/o left side chest pain that is described as being dull that is located at left rib cage area and sob, had n/v yesterday and a cough that is producitve,

## 2016-05-13 NOTE — ED Notes (Signed)
Patient states that his pain came on tonight around 2330.  Patient states that he has been hurting with coughing over the past couple of days.  Started with a bad cough, and now is coughing up clear phlegm.

## 2016-05-13 NOTE — Discharge Instructions (Signed)
Drink plenty of fluids. Take the antibiotics until gone. Take the prednisone until gone. This should also help with your chest pain. You can take Mucinex DM over-the-counter for your cough. Use the inhaler 2 puffs every 6 hours as needed for wheezing, coughing, or shortness of breath. Return to the emergency department if you get a high fever, you struggle to breathe despite using the inhaler, or you feel worse.

## 2016-05-13 NOTE — Progress Notes (Signed)
MDI and spacer given to patient with instruction to him and wife. Pt understands and has no questions

## 2016-05-13 NOTE — ED Provider Notes (Signed)
[facility] DEPT Provider Note   CSN: [id number] Arrival date & time: 05/13/16  0027  First Provider Contact:   First MD Initiated Contact with Patient 05/13/16 0045        By signing my name below, I, James [name], attest that this documentation has been prepared under the direction and in the presence of Rolland Porter, MD. Electronically Signed: Hansel [name], ED Scribe. 05/13/16. 12:54 AM.    History   Chief Complaint Chief Complaint  Patient presents with  . Chest Pain    HPI James [name] is a 55 y.o. male with h/o HTN, cardiomyopathy, hypercholesterolemia who presents to the Emergency Department complaining of moderate, left-sided CP and tightness onset 3 days ago and worsened tonight. Per pt, when his pain initially began it was intermittent, lasting 30-60 minutes at a time, worsened when lying on his left side and alleviated when lying on his right side. He states that his pain worsened to be constant 2 hours ago and is currently alleviated when sitting forward, and feels worse when laying back. Pt also complains of productive cough with clear and light yellow phlegm, wheezing and SOB for 3 days. He reports that his chest pain is also worsened with coughing. Pt notes his SOB is alleviated when sitting forward. No known sick contacts with similar symptoms. Pt has h/o asymptomatic MI, cardiac cath. No h/o cardiac stent, DM. Pt does not currently use a nebulizer or inhaler but states he has previously. Pt is a current smoker, 1 ppd. Pt is a non-drinker. He is currently employed. He denies fever, chills, diaphoresis, leg swelling.   PCPRosita Fire, MD   Cardiologist- Dr. Bronson Ing   The history is provided by the patient. No language interpreter was used.    Past Medical History:  Diagnosis Date  . Arteriosclerotic cardiovascular disease (ASCVD)    Insignificant disease in 2002 and 2013  . Cardiomyopathy ([place])    CHF and EF of 25% in 02/2012; normal TSH  . Hypercholesteremia     . Hypertension   . Obstructive sleep apnea   . Tobacco abuse     Patient Active Problem List   Diagnosis Date Noted  . Hyperlipidemia 11/04/2012  . Aortic root dilatation ([place]) 08/05/2012  . Cholelithiasis   . Obstructive sleep apnea 04/28/2012  . Cardiomyopathy, nonischemic ([place]) 04/03/2012  . Hypertension 08/07/2010  . 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/07/2010    Past Surgical History:  Procedure Laterality Date  . CARDIAC CATHETERIZATION  2002  . COLONOSCOPY N/A 03/22/2014   Procedure: COLONOSCOPY;  Surgeon: Danie Binder, MD;  Location: [facility];  Service: Endoscopy;  Laterality: N/A;  11:15 AM       Home Medications    Prior to Admission medications   Medication Sig Start Date End Date Taking? Authorizing Provider  aspirin 81 MG tablet Take 81 mg by mouth daily.   Yes Historical Provider, MD  furosemide (LASIX) 40 MG tablet TAKE 1 TABLET (40 MG TOTAL) BY MOUTH EVERY MORNING. 08/02/13  Yes Arnoldo Lenis, MD  hydrALAZINE (APRESOLINE) 50 MG tablet TAKE 1 TABLET (50 MG TOTAL) BY MOUTH 3 (THREE) TIMES DAILY. 06/03/15  Yes Herminio Commons, MD  isosorbide mononitrate (IMDUR) 120 MG 24 hr tablet TAKE 1 TABLET BY MOUTH EVERY DAY 01/18/14  Yes Lendon Colonel, NP  lisinopril (PRINIVIL,ZESTRIL) 40 MG tablet TAKE 1 TABLET BY MOUTH EVERY DAY 01/18/14  Yes Lendon Colonel, NP  loratadine (CLARITIN) 10 MG tablet Take 10 mg by mouth  daily.   Yes Historical Provider, MD  pantoprazole (PROTONIX) 40 MG tablet TAKE 1 TABLET (40 MG TOTAL) BY MOUTH DAILY. 03/22/13  Yes Lendon Colonel, NP  pravastatin (PRAVACHOL) 40 MG tablet Take 1 tablet (40 mg total) by mouth every evening. 10/31/12  Yes Yehuda Savannah, MD  spironolactone (ALDACTONE) 25 MG tablet TAKE 1 TABLET (25 MG TOTAL) BY MOUTH DAILY. 11/14/15  Yes Herminio Commons, MD  azithromycin (ZITHROMAX) 250 MG tablet Take 1 tablet (250 mg total) by mouth daily. Take first 2 tablets together, then 1 every day  until finished. 05/13/16   Rolland Porter, MD  carvedilol (COREG) 25 MG tablet Take 2 tablets (50 mg total) by mouth 2 (two) times daily. 03/31/13 03/31/14  Yehuda Savannah, MD  hydrALAZINE (APRESOLINE) 100 MG tablet Take 1 tablet (100 mg total) by mouth 3 (three) times daily. 05/11/15   Herminio Commons, MD  predniSONE (DELTASONE) 20 MG tablet Take 3 po QD x 3d , then 2 po QD x 3d then 1 po QD x 3d 05/13/16   Rolland Porter, MD  spironolactone (ALDACTONE) 25 MG tablet TAKE 1 TABLET (25 MG TOTAL) BY MOUTH DAILY. 11/23/15   Herminio Commons, MD    Family History Family History  Problem Relation Age of Onset  . Heart attack Father   . Hypertension Father   . Iron deficiency Father   . Coronary artery disease Mother   . Heart disease Maternal Grandfather     Also paternal grandfather  . Cardiomyopathy Brother     No significant coronary disease by coronary angiography  . Thyroid disease Sister   . Sudden death Other   . Colon cancer Neg Hx     Social History Social History  Substance Use Topics  . Smoking status: Current Every Day Smoker    Packs/day: 0.50    Years: 32.00    Types: Cigarettes    Start date: 10/15/1982  . Smokeless tobacco: Never Used     Comment: 3 cigaretts a day  . Alcohol use No  employed   Allergies   Review of patient's allergies indicates no known allergies.   Review of Systems Review of Systems  Constitutional: Negative for chills, diaphoresis and fever.  Respiratory: Positive for cough, chest tightness, shortness of breath and wheezing.   Cardiovascular: Positive for chest pain. Negative for leg swelling.  All other systems reviewed and are negative.    Physical Exam Updated Vital Signs BP (!) 158/114   Pulse 96   Temp 98.5 F (36.9 C) (Oral)   Resp 24   Ht 6\' 6"  (1.981 m)   Wt 280 lb (127 kg)   SpO2 97%   BMI 32.36 kg/m   Vital signs normal Except for hypertension   Physical Exam  Constitutional: He is oriented to person, place, and time.  He appears well-developed and well-nourished.  Non-toxic appearance. He does not appear ill. No distress.  HENT:  Head: Normocephalic and atraumatic.  Right Ear: External ear normal.  Left Ear: External ear normal.  Nose: Nose normal. No mucosal edema or rhinorrhea.  Mouth/Throat: Oropharynx is clear and moist and mucous membranes are normal. No dental abscesses or uvula swelling.  Eyes: Conjunctivae and EOM are normal. Pupils are equal, round, and reactive to light.  Neck: Normal range of motion and full passive range of motion without pain. Neck supple.  Cardiovascular: Normal rate, regular rhythm and normal heart sounds.  Exam reveals no gallop and no friction rub.  No murmur heard. Pulmonary/Chest: Effort normal. No respiratory distress. He has wheezes. He has rhonchi. He has no rales. He exhibits no tenderness and no crepitus.  Diffuse wheezing and rhonchi.   Abdominal: Soft. Normal appearance and bowel sounds are normal. He exhibits no distension. There is no tenderness. There is no rebound and no guarding.  Musculoskeletal: Normal range of motion. He exhibits no edema or tenderness.  Moves all extremities well.   Neurological: He is alert and oriented to person, place, and time. He has normal strength. No cranial nerve deficit.  Skin: Skin is warm, dry and intact. No rash noted. No erythema. No pallor.  Psychiatric: He has a normal mood and affect. His speech is normal and behavior is normal. His mood appears not anxious.  Nursing note and vitals reviewed.    ED Treatments / Results  Labs (all labs ordered are listed, but only abnormal results are displayed) Results for orders placed or performed during the hospital encounter of [id number]  Basic metabolic panel  Result Value Ref Range   Sodium 136 135 - 145 mmol/L   Potassium 3.6 3.5 - 5.1 mmol/L   Chloride 106 101 - 111 mmol/L   CO2 26 22 - 32 mmol/L   Glucose, Bld 135 (H) 65 - 99 mg/dL   BUN 14 6 - 20 mg/dL   Creatinine,  Ser 0.99 0.61 - 1.24 mg/dL   Calcium 8.7 (L) 8.9 - 10.3 mg/dL   GFR calc non Af Amer >60 >60 mL/min   GFR calc Af Amer >60 >60 mL/min   Anion gap 4 (L) 5 - 15  CBC  Result Value Ref Range   WBC 11.3 (H) 4.0 - 10.5 K/uL   RBC 5.19 4.22 - 5.81 MIL/uL   Hemoglobin 16.5 13.0 - 17.0 g/dL   HCT 46.9 39.0 - 52.0 %   MCV 90.4 78.0 - 100.0 fL   MCH 31.8 26.0 - 34.0 pg   MCHC 35.2 30.0 - 36.0 g/dL   RDW 12.8 11.5 - 15.5 %   Platelets 192 150 - 400 K/uL  Brain natriuretic peptide  Result Value Ref Range   B Natriuretic Peptide 425.0 (H) 0.0 - 100.0 pg/mL  I-stat troponin, ED  Result Value Ref Range   Troponin i, poc 0.05 0.00 - 0.08 ng/mL   Comment 3          I-Stat Chem 8, ED  Result Value Ref Range   Sodium 140 135 - 145 mmol/L   Potassium 3.7 3.5 - 5.1 mmol/L   Chloride 101 101 - 111 mmol/L   BUN 14 6 - 20 mg/dL   Creatinine, Ser 1.10 0.61 - 1.24 mg/dL   Glucose, Bld 132 (H) 65 - 99 mg/dL   Calcium, Ion 1.13 1.13 - 1.30 mmol/L   TCO2 30 0 - 100 mmol/L   Hemoglobin 16.7 13.0 - 17.0 g/dL   HCT 49.0 39.0 - 52.0 %   Laboratory interpretation all normal except leukocytosis    EKG  EKG Interpretation  Date/Time:  Sunday May 13 2016 00:35:35 EDT Ventricular Rate:  97 PR Interval:    QRS Duration: 86 QT Interval:  366 QTC Calculation: 465 R Axis:   -46 Text Interpretation:  Sinus rhythm Probable left atrial enlargement Left anterior fascicular block Anterior infarct, old Abnormal T, consider ischemia, lateral leads T wave inversion no longer evident in Inferior leads Since last tracing 31 Jul 2010 Confirmed by [facility]  MD-I, Shalena Ezzell ([id number]) on 05/13/2016 1:05:14 AM  Radiology Dg Chest 2 View  Result Date: 05/13/2016 CLINICAL DATA:  55 year old male with chest pain EXAM: CHEST  2 VIEW COMPARISON:  Chest radiograph dated 03/05/2012 FINDINGS: There is diffuse intravascular prominence and interstitial nodularity compatible with congestive changes. Superimposed pneumonia is not  excluded. Clinical correlation is recommended. There is no focal consolidation, pleural effusion, or pneumothorax. The cardiac silhouette is within normal limits. No acute osseous pathology. IMPRESSION: Pulmonary vascular congestion.  No focal consolidation. Electronically Signed   By: Anner Crete M.D.   On: 05/13/2016 01:14   Procedures Procedures (including critical care time)  DIAGNOSTIC STUDIES: Oxygen Saturation is 97% on RA, normal by my interpretation.      Medications Ordered in ED Medications  albuterol (PROVENTIL HFA;VENTOLIN HFA) 108 (90 Base) MCG/ACT inhaler 2 puff (2 puffs Inhalation Given 05/13/16 0213)  aerochamber Z-Stat Plus/medium 1 each (not administered)  predniSONE (DELTASONE) tablet 60 mg (60 mg Oral Given 05/13/16 0120)  ketorolac (TORADOL) 30 MG/ML injection 30 mg (30 mg Intravenous Given 05/13/16 0120)  ipratropium-albuterol (DUONEB) 0.5-2.5 (3) MG/3ML nebulizer solution 3 mL (3 mLs Nebulization Given 05/13/16 0122)  albuterol (PROVENTIL) (2.5 MG/3ML) 0.083% nebulizer solution 2.5 mg (2.5 mg Nebulization Given 05/13/16 0122)  ipratropium-albuterol (DUONEB) 0.5-2.5 (3) MG/3ML nebulizer solution 3 mL (3 mLs Nebulization Given 05/13/16 0149)  albuterol (PROVENTIL) (2.5 MG/3ML) 0.083% nebulizer solution 2.5 mg (2.5 mg Nebulization Given 05/13/16 0149)     Initial Impression / Assessment and Plan / ED Course  I have reviewed the triage vital signs and the nursing notes.  Pertinent labs & imaging results that were available during my care of the patient were reviewed by me and considered in my medical decision making (see chart for details).  Clinical Course   COORDINATION OF CARE: 12:53 AM Discussed treatment plan with pt at bedside which includes CXR, lab work, breathing treatment, steroids and pt agreed to plan.  Recheck 1:40 AM we discussed patient's lab results and chest x-ray results. He states he's feeling better after the nebulizer. When I re-listened to him  he now just has some rhonchi in the right lower lung fields and the left upper lung fields. His nebulizer was repeated. We discussed going home with inhaler which respiratory will show him how to use with a spacer, steroids and antibiotic.  Patient received a second nebulizer treatment. Respiratory therapy also taught him how to use the inhaler with a spacer. He was rechecked at 3:30 AM. He had been sleeping. He states his breathing is much better now. He was noted to be laying flat in bed compared to sitting bolt upright when he arrived in the ED. On reexam he has few scattered rhonchi but much improved. He feels ready to be discharged.  Final Clinical Impressions(s) / ED Diagnoses   Final diagnoses:  Bronchitis  Chest wall pain  Bronchospasm    New Prescriptions Discharge Medication List as of 05/13/2016  3:29 AM    START taking these medications   Details  azithromycin (ZITHROMAX) 250 MG tablet Take 1 tablet (250 mg total) by mouth daily. Take first 2 tablets together, then 1 every day until finished., Starting Sun 05/13/2016, Print    predniSONE (DELTASONE) 20 MG tablet Take 3 po QD x 3d , then 2 po QD x 3d then 1 po QD x 3d, Print          I personally performed the services described in this documentation, which was scribed in my presence. The recorded information has been reviewed and  considered.  Rolland Porter, MD, Barbette Or, MD 05/13/16 [phone]

## 2016-11-25 ENCOUNTER — Encounter (HOSPITAL_COMMUNITY): Payer: Self-pay | Admitting: Emergency Medicine

## 2016-11-25 ENCOUNTER — Emergency Department (HOSPITAL_COMMUNITY): Payer: BLUE CROSS/BLUE SHIELD

## 2016-11-25 ENCOUNTER — Emergency Department (HOSPITAL_COMMUNITY)
Admission: EM | Admit: 2016-11-25 | Discharge: 2016-11-25 | Disposition: A | Discharge status: Home / Self Care | Payer: BLUE CROSS/BLUE SHIELD | Attending: Emergency Medicine | Admitting: Emergency Medicine

## 2016-11-25 DIAGNOSIS — Z7982 Long term (current) use of aspirin: Secondary | ICD-10-CM | POA: Insufficient documentation

## 2016-11-25 DIAGNOSIS — I509 Heart failure, unspecified: Secondary | ICD-10-CM | POA: Diagnosis not present

## 2016-11-25 DIAGNOSIS — Z79899 Other long term (current) drug therapy: Secondary | ICD-10-CM | POA: Diagnosis not present

## 2016-11-25 DIAGNOSIS — I2510 Atherosclerotic heart disease of native coronary artery without angina pectoris: Secondary | ICD-10-CM | POA: Diagnosis not present

## 2016-11-25 DIAGNOSIS — R079 Chest pain, unspecified: Secondary | ICD-10-CM

## 2016-11-25 DIAGNOSIS — F17210 Nicotine dependence, cigarettes, uncomplicated: Secondary | ICD-10-CM | POA: Diagnosis not present

## 2016-11-25 DIAGNOSIS — J069 Acute upper respiratory infection, unspecified: Secondary | ICD-10-CM

## 2016-11-25 DIAGNOSIS — I110 Hypertensive heart disease with heart failure: Secondary | ICD-10-CM | POA: Diagnosis not present

## 2016-11-25 LAB — BASIC METABOLIC PANEL
ANION GAP: 4 — AB (ref 5–15)
BUN: 15 mg/dL (ref 6–20)
CALCIUM: 8.7 mg/dL — AB (ref 8.9–10.3)
CO2: 27 mmol/L (ref 22–32)
Chloride: 105 mmol/L (ref 101–111)
Creatinine, Ser: 0.98 mg/dL (ref 0.61–1.24)
GLUCOSE: 119 mg/dL — AB (ref 65–99)
POTASSIUM: 3.8 mmol/L (ref 3.5–5.1)
Sodium: 136 mmol/L (ref 135–145)

## 2016-11-25 LAB — CBC
HEMATOCRIT: 47.2 % (ref 39.0–52.0)
HEMOGLOBIN: 16.7 g/dL (ref 13.0–17.0)
MCH: 32.5 pg (ref 26.0–34.0)
MCHC: 35.4 g/dL (ref 30.0–36.0)
MCV: 91.8 fL (ref 78.0–100.0)
Platelets: 192 10*3/uL (ref 150–400)
RBC: 5.14 MIL/uL (ref 4.22–5.81)
RDW: 13.3 % (ref 11.5–15.5)
WBC: 8.8 10*3/uL (ref 4.0–10.5)

## 2016-11-25 LAB — TROPONIN I

## 2016-11-25 MED ORDER — METHOCARBAMOL 500 MG PO TABS: 500.0000 mg | ORAL_TABLET | Freq: Three times a day (TID) | ORAL | 0 refills | Status: DC | PRN

## 2016-11-25 NOTE — ED Provider Notes (Signed)
[place] DEPT Provider Note   CSN: [id number] Arrival date & time: 11/25/16  0735     History   Chief Complaint Chief Complaint  Patient presents with  . Chest Pain    HPI James [name] is a 56 y.o. male.  HPI Patient presents with left-sided chest pain. He's had a cough for the last few days. Has some shortness of breath and cough. Has chest tightness and a sharp pain that comes and goes. Last around 2 seconds. He can make it come on by certain movements. He says going up and down stairs makes it come on with walking on flat surface does not. No fevers. Slight production with the cough. Has a history of coronary artery disease and CHF and has not had pains like this in the past.   Past Medical History:  Diagnosis Date  . Arteriosclerotic cardiovascular disease (ASCVD)    Insignificant disease in 2002 and 2013  . Cardiomyopathy ([place])    CHF and EF of 25% in 02/2012; normal TSH  . Hypercholesteremia   . Hypertension   . Obstructive sleep apnea   . Tobacco abuse     Patient Active Problem List   Diagnosis Date Noted  . Hyperlipidemia 11/04/2012  . Aortic root dilatation ([place]) 08/05/2012  . Cholelithiasis   . Obstructive sleep apnea 04/28/2012  . Cardiomyopathy, nonischemic ([place]) 04/03/2012  . Hypertension 08/07/2010  . 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/07/2010    Past Surgical History:  Procedure Laterality Date  . CARDIAC CATHETERIZATION  2002  . COLONOSCOPY N/A 03/22/2014   Procedure: COLONOSCOPY;  Surgeon: Danie Binder, MD;  [facility];  Service: Endoscopy;  Laterality: N/A;  11:15 AM       Home Medications    Prior to Admission medications   Medication Sig Start Date End Date Taking? Authorizing Provider  aspirin 81 MG tablet Take 81 mg by mouth daily.   Yes Historical Provider, MD  carvedilol (COREG) 25 MG tablet Take 2 tablets (50 mg total) by mouth 2 (two) times daily. 03/31/13 11/25/16 Yes Yehuda Savannah, MD    furosemide (LASIX) 40 MG tablet TAKE 1 TABLET (40 MG TOTAL) BY MOUTH EVERY MORNING. 08/02/13  Yes Arnoldo Lenis, MD  hydrALAZINE (APRESOLINE) 50 MG tablet TAKE 1 TABLET (50 MG TOTAL) BY MOUTH 3 (THREE) TIMES DAILY. 06/03/15  Yes Herminio Commons, MD  isosorbide mononitrate (IMDUR) 120 MG 24 hr tablet TAKE 1 TABLET BY MOUTH EVERY DAY 01/18/14  Yes Lendon Colonel, NP  lisinopril (PRINIVIL,ZESTRIL) 40 MG tablet TAKE 1 TABLET BY MOUTH EVERY DAY 01/18/14  Yes Lendon Colonel, NP  loratadine (CLARITIN) 10 MG tablet Take 10 mg by mouth daily.   Yes Historical Provider, MD  Multiple Vitamins-Minerals (ONE-A-DAY 50 PLUS PO) Take 1 tablet by mouth daily.   Yes Historical Provider, MD  pantoprazole (PROTONIX) 40 MG tablet TAKE 1 TABLET (40 MG TOTAL) BY MOUTH DAILY. 03/22/13  Yes Lendon Colonel, NP  pravastatin (PRAVACHOL) 40 MG tablet Take 1 tablet (40 mg total) by mouth every evening. 10/31/12  Yes Yehuda Savannah, MD  spironolactone (ALDACTONE) 25 MG tablet TAKE 1 TABLET (25 MG TOTAL) BY MOUTH DAILY. 11/14/15  Yes Herminio Commons, MD    Family History Family History  Problem Relation Age of Onset  . Heart attack Father   . Hypertension Father   . Iron deficiency Father   . Coronary artery disease Mother   . Heart disease Maternal Grandfather  Also paternal grandfather  . Cardiomyopathy Brother     No significant coronary disease by coronary angiography  . Thyroid disease Sister   . Sudden death Other   . Colon cancer Neg Hx     Social History Social History  Substance Use Topics  . Smoking status: Current Every Day Smoker    Packs/day: 0.50    Years: 32.00    Types: Cigarettes    Start date: 10/15/1982  . Smokeless tobacco: Never Used     Comment: 3 cigaretts a day  . Alcohol use No     Allergies   Patient has no known allergies.   Review of Systems Review of Systems  Constitutional: Negative for appetite change and fever.  HENT: Negative for congestion.    Eyes: Negative for visual disturbance.  Respiratory: Positive for cough.   Cardiovascular: Positive for chest pain.  Gastrointestinal: Negative for abdominal pain.  Genitourinary: Negative for enuresis.  Musculoskeletal: Negative for back pain.  Neurological: Negative for headaches.  Hematological: Negative for adenopathy.  Psychiatric/Behavioral: Negative for confusion.     Physical Exam Updated Vital Signs BP 149/90 (BP Location: Left Arm)   Pulse (!) 55   Temp 98.2 F (36.8 C) (Oral)   Resp 16   Ht 6\' 6"  (1.981 m)   Wt 280 lb (127 kg)   SpO2 96%   BMI 32.36 kg/m   Physical Exam  Constitutional: He appears well-developed.  HENT:  Head: Atraumatic.  Eyes: EOM are normal.  Neck: Neck supple.  Cardiovascular: Normal rate.   Pulmonary/Chest: Effort normal.  No chest tenderness, however with certain movements the pain does appear to come on.  Abdominal: Soft. There is no tenderness.  Musculoskeletal: He exhibits no tenderness.  Neurological: He is alert.  Skin: Skin is warm. Capillary refill takes less than 2 seconds.     ED Treatments / Results  Labs (all labs ordered are listed, but only abnormal results are displayed) Labs Reviewed  BASIC METABOLIC PANEL - Abnormal; Notable for the following:       Result Value   Glucose, Bld 119 (*)    Calcium 8.7 (*)    Anion gap 4 (*)    All other components within normal limits  CBC  TROPONIN I    EKG  EKG Interpretation  Date/Time:  Sunday November 25 2016 07:52:39 EST Ventricular Rate:  60 PR Interval:    QRS Duration: 95 QT Interval:  430 QTC Calculation: 430 R Axis:   -33 Text Interpretation:  Sinus rhythm Left axis deviation Probable anteroseptal infarct, old Abnormal T, consider ischemia, diffuse leads Baseline wander in lead(s) II III aVF V2 T wave changes are chronic. Confirmed by Alvino Chapel  MD, Ovid Curd [phone]) on 11/25/2016 8:00:38 AM       Radiology Dg Chest 2 View  Result Date:  11/25/2016 CLINICAL DATA:  Chest pain, shortness of Breath EXAM: CHEST  2 VIEW COMPARISON:  05/13/2016 FINDINGS: Heart and mediastinal contours are within normal limits. No focal opacities or effusions. No acute bony abnormality. IMPRESSION: No active cardiopulmonary disease. Electronically Signed   By: Rolm Baptise M.D.   On: 11/25/2016 08:26    Procedures Procedures (including critical care time)  Medications Ordered in ED Medications - No data to display   Initial Impression / Assessment and Plan / ED Course  I have reviewed the triage vital signs and the nursing notes.  Pertinent labs & imaging results that were available during my care of the patient were reviewed by  me and considered in my medical decision making (see chart for details).     Patient with chest pain. Has had coughing and pain could be musculoskeletal. EKG is stable. Enzymes negative. Pain is been going on for long enough I think one troponin is fine. Will discharge home to follow-up as needed. Flu is considered but thought less likely.  Final Clinical Impressions(s) / ED Diagnoses   Final diagnoses:  Nonspecific chest pain  Upper respiratory tract infection, unspecified type    New Prescriptions New Prescriptions   No medications on file     Davonna Belling, MD 11/25/16 [phone]

## 2016-11-25 NOTE — ED Triage Notes (Signed)
Patient complains of left sided chest pain that started this morning with shortness of breath. Complains of chest tightness and sharp pain.

## 2017-01-04 ENCOUNTER — Ambulatory Visit: Payer: BLUE CROSS/BLUE SHIELD | Admitting: Cardiovascular Disease

## 2017-01-04 ENCOUNTER — Encounter: Payer: Self-pay | Admitting: Cardiovascular Disease

## 2017-06-20 ENCOUNTER — Ambulatory Visit (INDEPENDENT_AMBULATORY_CARE_PROVIDER_SITE_OTHER): Payer: BLUE CROSS/BLUE SHIELD | Admitting: General Surgery

## 2017-06-20 ENCOUNTER — Encounter: Payer: Self-pay | Admitting: General Surgery

## 2017-06-20 VITALS — BP 147/83 | HR 59 | Temp 97.7°F | Resp 18 | Ht 78.0 in | Wt 267.0 lb

## 2017-06-20 DIAGNOSIS — K4090 Unilateral inguinal hernia, without obstruction or gangrene, not specified as recurrent: Secondary | ICD-10-CM

## 2017-06-20 NOTE — Progress Notes (Signed)
James [name]; [id number]; 1961/10/06   HPI Patient is a 56 year old white male who was referred to my care for evaluation treatment of a right inguinal hernia.  He was referred by Dr. Legrand Rams for further evaluation and treatment.  States he recently noted a hernia, but it is increasing in size and causing him discomfort.  Is made worse with straining.  No nausea or vomiting have been noted.  He does have a pain of 510. Past Medical History:  Diagnosis Date  . Arteriosclerotic cardiovascular disease (ASCVD)    Insignificant disease in 2002 and 2013  . Cardiomyopathy ([place])    CHF and EF of 25% in 02/2012; normal TSH  . Hypercholesteremia   . Hypertension   . Obstructive sleep apnea   . Tobacco abuse     Past Surgical History:  Procedure Laterality Date  . CARDIAC CATHETERIZATION  2002  . COLONOSCOPY N/A 03/22/2014   Procedure: COLONOSCOPY;  Surgeon: Danie Binder, MD;  Location: [facility];  Service: Endoscopy;  Laterality: N/A;  11:15 AM    Family History  Problem Relation Age of Onset  . Heart attack Father   . Hypertension Father   . Iron deficiency Father   . Coronary artery disease Mother   . Heart disease Maternal Grandfather        Also paternal grandfather  . Cardiomyopathy Brother        No significant coronary disease by coronary angiography  . Thyroid disease Sister   . Sudden death Other   . Colon cancer Neg Hx     Current Outpatient Prescriptions on File Prior to Visit  Medication Sig Dispense Refill  . aspirin 81 MG tablet Take 81 mg by mouth daily.    . carvedilol (COREG) 25 MG tablet Take 2 tablets (50 mg total) by mouth 2 (two) times daily. 120 tablet 3  . furosemide (LASIX) 40 MG tablet TAKE 1 TABLET (40 MG TOTAL) BY MOUTH EVERY MORNING. 30 tablet 6  . hydrALAZINE (APRESOLINE) 50 MG tablet TAKE 1 TABLET (50 MG TOTAL) BY MOUTH 3 (THREE) TIMES DAILY. 270 tablet 2  . isosorbide mononitrate (IMDUR) 120 MG 24 hr tablet TAKE 1 TABLET BY MOUTH EVERY DAY 30 tablet  1  . lisinopril (PRINIVIL,ZESTRIL) 40 MG tablet TAKE 1 TABLET BY MOUTH EVERY DAY 30 tablet 1  . loratadine (CLARITIN) 10 MG tablet Take 10 mg by mouth daily.    . methocarbamol (ROBAXIN) 500 MG tablet Take 1 tablet (500 mg total) by mouth every 8 (eight) hours as needed for muscle spasms. 8 tablet 0  . Multiple Vitamins-Minerals (ONE-A-DAY 50 PLUS PO) Take 1 tablet by mouth daily.    . pantoprazole (PROTONIX) 40 MG tablet TAKE 1 TABLET (40 MG TOTAL) BY MOUTH DAILY. 30 tablet 9  . pravastatin (PRAVACHOL) 40 MG tablet Take 1 tablet (40 mg total) by mouth every evening. 90 tablet 3  . spironolactone (ALDACTONE) 25 MG tablet TAKE 1 TABLET (25 MG TOTAL) BY MOUTH DAILY. 30 tablet 1   No current facility-administered medications on file prior to visit.     No Known Allergies  History  Alcohol Use No    History  Smoking Status  . Current Every Day Smoker  . Packs/day: 0.50  . Years: 32.00  . Types: Cigarettes  . Start date: 10/15/1982  Smokeless Tobacco  . Never Used    Comment: 3 cigaretts a day    Review of Systems  Constitutional: Positive for malaise/fatigue.  HENT: Negative.  Eyes: Positive for blurred vision.  Respiratory: Negative.   Cardiovascular: Negative.   Gastrointestinal: Positive for abdominal pain.  Genitourinary: Positive for frequency.  Musculoskeletal: Positive for back pain.  Skin: Negative.   Neurological: Negative.   Endo/Heme/Allergies: Negative.   Psychiatric/Behavioral: Negative.     Objective   Vitals:   06/20/17 1006  BP: (!) 147/83  Pulse: (!) 59  Resp: 18  Temp: 97.7 F (36.5 C)    Physical Exam  Constitutional: He is oriented to person, place, and time and well-developed, well-nourished, and in no distress.  HENT:  Head: Normocephalic and atraumatic.  Cardiovascular: Normal rate, regular rhythm and normal heart sounds.  Exam reveals no friction rub.   No murmur heard. Pulmonary/Chest: Effort normal and breath sounds normal. No  respiratory distress. He has no wheezes. He has no rales.  Abdominal: Soft. Bowel sounds are normal. He exhibits no distension. There is no tenderness. There is no rebound.  Reducible right inguinal hernia noted.  Neurological: He is alert and oriented to person, place, and time.  Skin: Skin is warm and dry.  Vitals reviewed.   echo in 2015 showed a 40-45% ejection fraction. Assessment    Right inguinal hernia Plan    patient is scheduled for right angle herniorrhaphy with mesh on 06/24/2017.  The risks and benefits of the procedure including bleeding, infection, mesh use, the possibility of recurrence of the hernia were fully explained to the patient, who gave informed consent.

## 2017-06-20 NOTE — Patient Instructions (Signed)
Inguinal Hernia, Adult An inguinal hernia is when fat or the intestines push through the area where the leg meets the lower abdomen (groin) and create a rounded lump (bulge). This condition develops over time. There are three types of inguinal hernias. These types include:  Hernias that can be pushed back into the belly (are reducible).  Hernias that are not reducible (are incarcerated).  Hernias that are not reducible and lose their blood supply (are strangulated). This type of hernia requires emergency surgery.  What are the causes? This condition is caused by having a weak spot in the muscles or tissue. This weakness lets the hernia poke through. This condition can be triggered by:  Suddenly straining the muscles of the lower abdomen.  Lifting heavy objects.  Straining to have a bowel movement. Difficult bowel movements (constipation) can lead to this.  Coughing.  What increases the risk? This condition is more likely to develop in:  Men.  Pregnant women.  People who: ? Are overweight. ? Work in jobs that require long periods of standing or heavy lifting. ? Have had an inguinal hernia before. ? Smoke or have lung disease. These factors can lead to long-lasting (chronic) coughing.  What are the signs or symptoms? Symptoms can depend on the size of the hernia. Often, a small inguinal hernia has no symptoms. Symptoms of a larger hernia include:  A lump in the groin. This is easier to see when the person is standing. It might not be visible when he or she is lying down.  Pain or burning in the groin. This occurs especially when lifting, straining, or coughing.  A dull ache or a feeling of pressure in the groin.  A lump in the scrotum in men.  Symptoms of a strangulated inguinal hernia can include:  A bulge in the groin that is very painful and tender to the touch.  A bulge that turns red or purple.  Fever, nausea, and vomiting.  The inability to have a bowel  movement or to pass gas.  How is this diagnosed? This condition is diagnosed with a medical history and physical exam. Your health care provider may feel your groin area and ask you to cough. How is this treated? Treatment for this condition varies depending on the size of your hernia and whether you have symptoms. If you do not have symptoms, your health care provider may have you watch your hernia carefully and come in for follow-up visits. If your hernia is larger or if you have symptoms, your treatment will include surgery. Follow these instructions at home: Lifestyle  Drink enough fluid to keep your urine clear or pale yellow.  Eat a diet that includes a lot of fiber. Eat plenty of fruits, vegetables, and whole grains. Talk with your health care provider if you have questions.  Avoid lifting heavy objects.  Avoid standing for long periods of time.  Do not use tobacco products, including cigarettes, chewing tobacco, or e-cigarettes. If you need help quitting, ask your health care provider.  Maintain a healthy weight. General instructions  Do not try to force the hernia back in.  Watch your hernia for any changes in color or size. Let your health care provider know if any changes occur.  Take over-the-counter and prescription medicines only as told by your health care provider.  Keep all follow-up visits as told by your health care provider. This is important. Contact a health care provider if:  You have a fever.  You have new   symptoms.  Your symptoms get worse. Get help right away if:  You have pain in the groin that suddenly gets worse.  A bulge in the groin gets bigger suddenly and does not go down.  You are a man and you have a sudden pain in the scrotum, or the size of your scrotum suddenly changes.  A bulge in the groin area becomes red or purple and is painful to the touch.  You have nausea or vomiting that does not go away.  You feel your heart beating a lot  more quickly than normal.  You cannot have a bowel movement or pass gas. This information is not intended to replace advice given to you by your health care provider. Make sure you discuss any questions you have with your health care provider. Document Released: 02/17/2009 Document Revised: 03/08/2016 Document Reviewed: 08/11/2014 [employer] Interactive Patient Education  2018 [employer]  

## 2017-06-20 NOTE — Patient Instructions (Signed)
James [name]  06/20/2017     @PREFPERIOPPHARMACY @   Your procedure is scheduled on  06/24/2017  Report to [facility] at  720  A.M.  Call this number if you have problems the morning of surgery:  [phone]   Remember:  Do not eat food or drink liquids after midnight.  Take these medicines the morning of surgery with A SIP OF WATER  Coreg, isosorbide, lisinopril, claritin, robaxin, protonix.   Do not wear jewelry, make-up or nail polish.  Do not wear lotions, powders, or perfumes, or deoderant.  Do not shave 48 hours prior to surgery.  Men may shave face and neck.  Do not bring valuables to the hospital.  [facility] is not responsible for any belongings or valuables.  Contacts, dentures or bridgework may not be worn into surgery.  Leave your suitcase in the car.  After surgery it may be brought to your room.  For patients admitted to the hospital, discharge time will be determined by your treatment team.  Patients discharged the day of surgery will not be allowed to drive home.   Name and phone number of your driver:   family Special instructions:  None  Please read over the following fact sheets that you were given. Anesthesia Post-op Instructions and Care and Recovery After Surgery       Open Hernia Repair, Adult Open hernia repair is a surgical procedure to fix a hernia. A hernia occurs when an internal organ or tissue pushes out through a weak spot in the abdominal wall muscles. Hernias commonly occur in the groin and around the navel. Most hernias tend to get worse over time. Often, surgery is done to prevent the hernia from becoming bigger, uncomfortable, or an emergency. Emergency surgery may be needed if abdominal contents get stuck in the opening (incarcerated hernia) or the blood supply gets cut off (strangulated hernia). In an open repair, an incision is made in the abdomen to perform the surgery. Tell a health care provider about:  Any  allergies you have.  All medicines you are taking, including vitamins, herbs, eye drops, creams, and over-the-counter medicines.  Any problems you or family members have had with anesthetic medicines.  Any blood or bone disorders you have.  Any surgeries you have had.  Any medical conditions you have, including any recent cold or flu symptoms.  Whether you are pregnant or may be pregnant. What are the risks? Generally, this is a safe procedure. However, problems may occur, including:  Long-lasting (chronic) pain.  Bleeding.  Infection.  Damage to the testicle. This can cause shrinking or swelling.  Damage to the bladder, blood vessels, intestine, or nerves near the hernia.  Trouble passing urine.  Allergic reactions to medicines.  Return of the hernia.  What happens before the procedure? Staying hydrated Follow instructions from your health care provider about hydration, which may include:  Up to 2 hours before the procedure - you may continue to drink clear liquids, such as water, clear fruit juice, black coffee, and plain tea.  Eating and drinking restrictions Follow instructions from your health care provider about eating and drinking, which may include:  8 hours before the procedure - stop eating heavy meals or foods such as meat, fried foods, or fatty foods.  6 hours before the procedure - stop eating light meals or foods, such as toast or cereal.  6 hours before the procedure - stop drinking milk  or drinks that contain milk.  2 hours before the procedure - stop drinking clear liquids.  Medicines  Ask your health care provider about: ? Changing or stopping your regular medicines. This is especially important if you are taking diabetes medicines or blood thinners. ? Taking medicines such as aspirin and ibuprofen. These medicines can thin your blood. Do not take these medicines before your procedure if your health care provider instructs you not to.  You may  be given antibiotic medicine to help prevent infection. General instructions  You may have blood tests or imaging studies.  Ask your health care provider how your surgical site will be marked or identified.  If you smoke, do not smoke for at least 2 weeks before your procedure or for as long as told by your health care provider.  Let your health care provider know if you develop a cold or any infection before your surgery.  Plan to have someone take you home from the hospital or clinic.  If you will be going home right after the procedure, plan to have someone with you for 24 hours. What happens during the procedure?  To reduce your risk of infection: ? Your health care team will wash or sanitize their hands. ? Your skin will be washed with soap. ? Hair may be removed from the surgical area.  An IV tube will be inserted into one of your veins.  You will be given one or more of the following: ? A medicine to help you relax (sedative). ? A medicine to numb the area (local anesthetic). ? A medicine to make you fall asleep (general anesthetic).  Your surgeon will make an incision over the hernia.  The tissues of the hernia will be moved back into place.  The edges of the hernia may be stitched together.  The opening in the abdominal muscles will be closed with stitches (sutures). Or, your surgeon will place a mesh patch made of manmade (synthetic) material over the opening.  The incision will be closed.  A bandage (dressing) may be placed over the incision. The procedure may vary among health care providers and hospitals. What happens after the procedure?  Your blood pressure, heart rate, breathing rate, and blood oxygen level will be monitored until the medicines you were given have worn off.  You may be given medicine for pain.  Do not drive for 24 hours if you received a sedative. This information is not intended to replace advice given to you by your health care  provider. Make sure you discuss any questions you have with your health care provider. Document Released: 03/27/2001 Document Revised: 04/20/2016 Document Reviewed: 03/14/2016 Elsevier Interactive Patient Education  2018 [place], Adult, Care After These instructions give you information about caring for yourself after your procedure. Your doctor may also give you more specific instructions. If you have problems or questions, contact your doctor. Follow these instructions at home: Surgical cut (incision) care   Follow instructions from your doctor about how to take care of your surgical cut area. Make sure you: ? Wash your hands with soap and water before you change your bandage (dressing). If you cannot use soap and water, use hand sanitizer. ? Change your bandage as told by your doctor. ? Leave stitches (sutures), skin glue, or skin tape (adhesive) strips in place. They may need to stay in place for 2 weeks or longer. If tape strips get loose and curl up, you may trim the  loose edges. Do not remove tape strips completely unless your doctor says it is okay.  Check your surgical cut every day for signs of infection. Check for: ? More redness, swelling, or pain. ? More fluid or blood. ? Warmth. ? Pus or a bad smell. Activity  Do not drive or use heavy machinery while taking prescription pain medicine. Do not drive until your doctor says it is okay.  Until your doctor says it is okay: ? Do not lift anything that is heavier than 10 lb (4.5 kg). ? Do not play contact sports.  Return to your normal activities as told by your doctor. Ask your doctor what activities are safe. General instructions  To prevent or treat having a hard time pooping (constipation) while you are taking prescription pain medicine, your doctor may recommend that you: ? Drink enough fluid to keep your pee (urine) clear or pale yellow. ? Take over-the-counter or prescription medicines. ? Eat  foods that are high in fiber, such as fresh fruits and vegetables, whole grains, and beans. ? Limit foods that are high in fat and processed sugars, such as fried and sweet foods.  Take over-the-counter and prescription medicines only as told by your doctor.  Do not take baths, swim, or use a hot tub until your doctor says it is okay.  Keep all follow-up visits as told by your doctor. This is important. Contact a doctor if:  You develop a rash.  You have more redness, swelling, or pain around your surgical cut.  You have more fluid or blood coming from your surgical cut.  Your surgical cut feels warm to the touch.  You have pus or a bad smell coming from your surgical cut.  You have a fever or chills.  You have blood in your poop (stool).  You have not pooped in 2-3 days.  Medicine does not help your pain. Get help right away if:  You have chest pain or you are short of breath.  You feel light-headed.  You feel weak and dizzy (feel faint).  You have very bad pain.  You throw up (vomit) and your pain is worse. This information is not intended to replace advice given to you by your health care provider. Make sure you discuss any questions you have with your health care provider. Document Released: 10/22/2014 Document Revised: 04/20/2016 Document Reviewed: 03/14/2016 Elsevier Interactive Patient Education  2017 [place] Anesthesia, Adult General anesthesia is the use of medicines to make a person "go to sleep" (be unconscious) for a medical procedure. General anesthesia is often recommended when a procedure:  Is long.  Requires you to be still or in an unusual position.  Is major and can cause you to lose blood.  Is impossible to do without general anesthesia.  The medicines used for general anesthesia are called general anesthetics. In addition to making you sleep, the medicines:  Prevent pain.  Control your blood pressure.  Relax your  muscles.  Tell a health care provider about:  Any allergies you have.  All medicines you are taking, including vitamins, herbs, eye drops, creams, and over-the-counter medicines.  Any problems you or family members have had with anesthetic medicines.  Types of anesthetics you have had in the past.  Any bleeding disorders you have.  Any surgeries you have had.  Any medical conditions you have.  Any history of heart or lung conditions, such as heart failure, sleep apnea, or chronic obstructive pulmonary disease (COPD).  Whether  you are pregnant or may be pregnant.  Whether you use tobacco, alcohol, marijuana, or street drugs.  Any history of Armed forces logistics/support/administrative officer.  Any history of depression or anxiety. What are the risks? Generally, this is a safe procedure. However, problems may occur, including:  Allergic reaction to anesthetics.  Lung and heart problems.  Inhaling food or liquids from your stomach into your lungs (aspiration).  Injury to nerves.  Waking up during your procedure and being unable to move (rare).  Extreme agitation or a state of mental confusion (delirium) when you wake up from the anesthetic.  Air in the bloodstream, which can lead to stroke.  These problems are more likely to develop if you are having a major surgery or if you have an advanced medical condition. You can prevent some of these complications by answering all of your health care provider's questions thoroughly and by following all pre-procedure instructions. General anesthesia can cause side effects, including:  Nausea or vomiting  A sore throat from the breathing tube.  Feeling cold or shivery.  Feeling tired, washed out, or achy.  Sleepiness or drowsiness.  Confusion or agitation.  What happens before the procedure? Staying hydrated Follow instructions from your health care provider about hydration, which may include:  Up to 2 hours before the procedure - you may continue to  drink clear liquids, such as water, clear fruit juice, black coffee, and plain tea.  Eating and drinking restrictions Follow instructions from your health care provider about eating and drinking, which may include:  8 hours before the procedure - stop eating heavy meals or foods such as meat, fried foods, or fatty foods.  6 hours before the procedure - stop eating light meals or foods, such as toast or cereal.  6 hours before the procedure - stop drinking milk or drinks that contain milk.  2 hours before the procedure - stop drinking clear liquids.  Medicines  Ask your health care provider about: ? Changing or stopping your regular medicines. This is especially important if you are taking diabetes medicines or blood thinners. ? Taking medicines such as aspirin and ibuprofen. These medicines can thin your blood. Do not take these medicines before your procedure if your health care provider instructs you not to. ? Taking new dietary supplements or medicines. Do not take these during the week before your procedure unless your health care provider approves them.  If you are told to take a medicine or to continue taking a medicine on the day of the procedure, take the medicine with sips of water. General instructions   Ask if you will be going home the same day, the following day, or after a longer hospital stay. ? Plan to have someone take you home. ? Plan to have someone stay with you for the first 24 hours after you leave the hospital or clinic.  For 3-6 weeks before the procedure, try not to use any tobacco products, such as cigarettes, chewing tobacco, and e-cigarettes.  You may brush your teeth on the morning of the procedure, but make sure to spit out the toothpaste. What happens during the procedure?  You will be given anesthetics through a mask and through an IV tube in one of your veins.  You may receive medicine to help you relax (sedative).  As soon as you are asleep, a  breathing tube may be used to help you breathe.  An anesthesia specialist will stay with you throughout the procedure. He or she will help keep  you comfortable and safe by continuing to give you medicines and adjusting the amount of medicine that you get. He or she will also watch your blood pressure, pulse, and oxygen levels to make sure that the anesthetics do not cause any problems.  If a breathing tube was used to help you breathe, it will be removed before you wake up. The procedure may vary among health care providers and hospitals. What happens after the procedure?  You will wake up, often slowly, after the procedure is complete, usually in a recovery area.  Your blood pressure, heart rate, breathing rate, and blood oxygen level will be monitored until the medicines you were given have worn off.  You may be given medicine to help you calm down if you feel anxious or agitated.  If you will be going home the same day, your health care provider may check to make sure you can stand, drink, and urinate.  Your health care providers will treat your pain and side effects before you go home.  Do not drive for 24 hours if you received a sedative.  You may: ? Feel nauseous and vomit. ? Have a sore throat. ? Have mental slowness. ? Feel cold or shivery. ? Feel sleepy. ? Feel tired. ? Feel sore or achy, even in parts of your body where you did not have surgery. This information is not intended to replace advice given to you by your health care provider. Make sure you discuss any questions you have with your health care provider. Document Released: 01/08/2008 Document Revised: 03/13/2016 Document Reviewed: 09/15/2015 Elsevier Interactive Patient Education  2018 [place] Anesthesia, Adult, Care After These instructions provide you with information about caring for yourself after your procedure. Your health care provider may also give you more specific instructions. Your  treatment has been planned according to current medical practices, but problems sometimes occur. Call your health care provider if you have any problems or questions after your procedure. What can I expect after the procedure? After the procedure, it is common to have:  Vomiting.  A sore throat.  Mental slowness.  It is common to feel:  Nauseous.  Cold or shivery.  Sleepy.  Tired.  Sore or achy, even in parts of your body where you did not have surgery.  Follow these instructions at home: For at least 24 hours after the procedure:  Do not: ? Participate in activities where you could fall or become injured. ? Drive. ? Use heavy machinery. ? Drink alcohol. ? Take sleeping pills or medicines that cause drowsiness. ? Make important decisions or sign legal documents. ? Take care of children on your own.  Rest. Eating and drinking  If you vomit, drink water, juice, or soup when you can drink without vomiting.  Drink enough fluid to keep your urine clear or pale yellow.  Make sure you have little or no nausea before eating solid foods.  Follow the diet recommended by your health care provider. General instructions  Have a responsible adult stay with you until you are awake and alert.  Return to your normal activities as told by your health care provider. Ask your health care provider what activities are safe for you.  Take over-the-counter and prescription medicines only as told by your health care provider.  If you smoke, do not smoke without supervision.  Keep all follow-up visits as told by your health care provider. This is important. Contact a health care provider if:  You continue to have  nausea or vomiting at home, and medicines are not helpful.  You cannot drink fluids or start eating again.  You cannot urinate after 8-12 hours.  You develop a skin rash.  You have fever.  You have increasing redness at the site of your procedure. Get help right  away if:  You have difficulty breathing.  You have chest pain.  You have unexpected bleeding.  You feel that you are having a life-threatening or urgent problem. This information is not intended to replace advice given to you by your health care provider. Make sure you discuss any questions you have with your health care provider. Document Released: 01/07/2001 Document Revised: 03/05/2016 Document Reviewed: 09/15/2015 Elsevier Interactive Patient Education  [employer].

## 2017-06-20 NOTE — H&P (Signed)
James [name]; [id number]; 1961-05-28   HPI Patient is a 56 year old white male who was referred to my care for evaluation treatment of a right inguinal hernia.  He was referred by Dr. Legrand Rams for further evaluation and treatment.  States he recently noted a hernia, but it is increasing in size and causing him discomfort.  Is made worse with straining.  No nausea or vomiting have been noted.  He does have a pain of 510. Past Medical History:  Diagnosis Date  . Arteriosclerotic cardiovascular disease (ASCVD)    Insignificant disease in 2002 and 2013  . Cardiomyopathy ([employer])    CHF and EF of 25% in 02/2012; normal TSH  . Hypercholesteremia   . Hypertension   . Obstructive sleep apnea   . Tobacco abuse     Past Surgical History:  Procedure Laterality Date  . CARDIAC CATHETERIZATION  2002  . COLONOSCOPY N/A 03/22/2014   Procedure: COLONOSCOPY;  Surgeon: Danie Binder, MD;  Location: [facility];  Service: Endoscopy;  Laterality: N/A;  11:15 AM    Family History  Problem Relation Age of Onset  . Heart attack Father   . Hypertension Father   . Iron deficiency Father   . Coronary artery disease Mother   . Heart disease Maternal Grandfather        Also paternal grandfather  . Cardiomyopathy Brother        No significant coronary disease by coronary angiography  . Thyroid disease Sister   . Sudden death Other   . Colon cancer Neg Hx     Current Outpatient Prescriptions on File Prior to Visit  Medication Sig Dispense Refill  . aspirin 81 MG tablet Take 81 mg by mouth daily.    . carvedilol (COREG) 25 MG tablet Take 2 tablets (50 mg total) by mouth 2 (two) times daily. 120 tablet 3  . furosemide (LASIX) 40 MG tablet TAKE 1 TABLET (40 MG TOTAL) BY MOUTH EVERY MORNING. 30 tablet 6  . hydrALAZINE (APRESOLINE) 50 MG tablet TAKE 1 TABLET (50 MG TOTAL) BY MOUTH 3 (THREE) TIMES DAILY. 270 tablet 2  . isosorbide mononitrate (IMDUR) 120 MG 24 hr tablet TAKE 1 TABLET BY MOUTH EVERY DAY 30 tablet  1  . lisinopril (PRINIVIL,ZESTRIL) 40 MG tablet TAKE 1 TABLET BY MOUTH EVERY DAY 30 tablet 1  . loratadine (CLARITIN) 10 MG tablet Take 10 mg by mouth daily.    . methocarbamol (ROBAXIN) 500 MG tablet Take 1 tablet (500 mg total) by mouth every 8 (eight) hours as needed for muscle spasms. 8 tablet 0  . Multiple Vitamins-Minerals (ONE-A-DAY 50 PLUS PO) Take 1 tablet by mouth daily.    . pantoprazole (PROTONIX) 40 MG tablet TAKE 1 TABLET (40 MG TOTAL) BY MOUTH DAILY. 30 tablet 9  . pravastatin (PRAVACHOL) 40 MG tablet Take 1 tablet (40 mg total) by mouth every evening. 90 tablet 3  . spironolactone (ALDACTONE) 25 MG tablet TAKE 1 TABLET (25 MG TOTAL) BY MOUTH DAILY. 30 tablet 1   No current facility-administered medications on file prior to visit.     No Known Allergies  History  Alcohol Use No    History  Smoking Status  . Current Every Day Smoker  . Packs/day: 0.50  . Years: 32.00  . Types: Cigarettes  . Start date: 10/15/1982  Smokeless Tobacco  . Never Used    Comment: 3 cigaretts a day    Review of Systems  Constitutional: Positive for malaise/fatigue.  HENT: Negative.  Eyes: Positive for blurred vision.  Respiratory: Negative.   Cardiovascular: Negative.   Gastrointestinal: Positive for abdominal pain.  Genitourinary: Positive for frequency.  Musculoskeletal: Positive for back pain.  Skin: Negative.   Neurological: Negative.   Endo/Heme/Allergies: Negative.   Psychiatric/Behavioral: Negative.     Objective   Vitals:   06/20/17 1006  BP: (!) 147/83  Pulse: (!) 59  Resp: 18  Temp: 97.7 F (36.5 C)    Physical Exam  Constitutional: He is oriented to person, place, and time and well-developed, well-nourished, and in no distress.  HENT:  Head: Normocephalic and atraumatic.  Cardiovascular: Normal rate, regular rhythm and normal heart sounds.  Exam reveals no friction rub.   No murmur heard. Pulmonary/Chest: Effort normal and breath sounds normal. No  respiratory distress. He has no wheezes. He has no rales.  Abdominal: Soft. Bowel sounds are normal. He exhibits no distension. There is no tenderness. There is no rebound.  Reducible right inguinal hernia noted.  Neurological: He is alert and oriented to person, place, and time.  Skin: Skin is warm and dry.  Vitals reviewed.   echo in 2015 showed a 40-45% ejection fraction. Assessment    Right inguinal hernia Plan    patient is scheduled for right angle herniorrhaphy with mesh on 06/24/2017.  The risks and benefits of the procedure including bleeding, infection, mesh use, the possibility of recurrence of the hernia were fully explained to the patient, who gave informed consent.

## 2017-06-21 ENCOUNTER — Encounter (HOSPITAL_COMMUNITY): Payer: Self-pay

## 2017-06-21 ENCOUNTER — Encounter (HOSPITAL_COMMUNITY)
Admission: RE | Admit: 2017-06-21 | Discharge: 2017-06-21 | Disposition: A | Discharge status: Home / Self Care | Payer: BLUE CROSS/BLUE SHIELD | Source: Ambulatory Visit | Attending: General Surgery | Admitting: General Surgery

## 2017-06-21 DIAGNOSIS — I252 Old myocardial infarction: Secondary | ICD-10-CM | POA: Diagnosis not present

## 2017-06-21 DIAGNOSIS — Z79899 Other long term (current) drug therapy: Secondary | ICD-10-CM | POA: Diagnosis not present

## 2017-06-21 DIAGNOSIS — F17210 Nicotine dependence, cigarettes, uncomplicated: Secondary | ICD-10-CM | POA: Diagnosis not present

## 2017-06-21 DIAGNOSIS — E119 Type 2 diabetes mellitus without complications: Secondary | ICD-10-CM | POA: Diagnosis not present

## 2017-06-21 DIAGNOSIS — Z7982 Long term (current) use of aspirin: Secondary | ICD-10-CM | POA: Diagnosis not present

## 2017-06-21 DIAGNOSIS — Z8349 Family history of other endocrine, nutritional and metabolic diseases: Secondary | ICD-10-CM | POA: Diagnosis not present

## 2017-06-21 DIAGNOSIS — I429 Cardiomyopathy, unspecified: Secondary | ICD-10-CM | POA: Diagnosis not present

## 2017-06-21 DIAGNOSIS — E7800 Pure hypercholesterolemia, unspecified: Secondary | ICD-10-CM | POA: Diagnosis not present

## 2017-06-21 DIAGNOSIS — I110 Hypertensive heart disease with heart failure: Secondary | ICD-10-CM | POA: Diagnosis not present

## 2017-06-21 DIAGNOSIS — K4090 Unilateral inguinal hernia, without obstruction or gangrene, not specified as recurrent: Secondary | ICD-10-CM | POA: Diagnosis present

## 2017-06-21 DIAGNOSIS — G4733 Obstructive sleep apnea (adult) (pediatric): Secondary | ICD-10-CM | POA: Diagnosis not present

## 2017-06-21 DIAGNOSIS — I739 Peripheral vascular disease, unspecified: Secondary | ICD-10-CM | POA: Diagnosis not present

## 2017-06-21 DIAGNOSIS — Z8249 Family history of ischemic heart disease and other diseases of the circulatory system: Secondary | ICD-10-CM | POA: Diagnosis not present

## 2017-06-21 DIAGNOSIS — I509 Heart failure, unspecified: Secondary | ICD-10-CM | POA: Diagnosis not present

## 2017-06-21 DIAGNOSIS — I2510 Atherosclerotic heart disease of native coronary artery without angina pectoris: Secondary | ICD-10-CM | POA: Diagnosis not present

## 2017-06-21 HISTORY — DX: Type 2 diabetes mellitus without complications: E11.9

## 2017-06-21 HISTORY — DX: Acute myocardial infarction, unspecified: I21.9

## 2017-06-21 LAB — BASIC METABOLIC PANEL
Anion gap: 7 (ref 5–15)
BUN: 19 mg/dL (ref 6–20)
CHLORIDE: 101 mmol/L (ref 101–111)
CO2: 28 mmol/L (ref 22–32)
CREATININE: 1 mg/dL (ref 0.61–1.24)
Calcium: 9 mg/dL (ref 8.9–10.3)
GFR calc non Af Amer: >60 mL/min (ref >60)
Glucose, Bld: 99 mg/dL (ref 65–99)
POTASSIUM: 3.8 mmol/L (ref 3.5–5.1)
SODIUM: 136 mmol/L (ref 135–145)

## 2017-06-21 LAB — CBC WITH DIFFERENTIAL/PLATELET
BASOS ABS: 0 10*3/uL (ref 0.0–0.1)
Basophils Relative: 0 %
EOS ABS: 0.3 10*3/uL (ref 0.0–0.7)
Eosinophils Relative: 3 %
HCT: 44.9 % (ref 39.0–52.0)
HEMOGLOBIN: 15.5 g/dL (ref 13.0–17.0)
LYMPHS ABS: 2.6 10*3/uL (ref 0.7–4.0)
Lymphocytes Relative: 27 %
MCH: 31.8 pg (ref 26.0–34.0)
MCHC: 34.5 g/dL (ref 30.0–36.0)
MCV: 92.2 fL (ref 78.0–100.0)
Monocytes Absolute: 0.9 10*3/uL (ref 0.1–1.0)
Monocytes Relative: 9 %
NEUTROS PCT: 61 %
Neutro Abs: 6 10*3/uL (ref 1.7–7.7)
Platelets: 186 10*3/uL (ref 150–400)
RBC: 4.87 MIL/uL (ref 4.22–5.81)
RDW: 13.1 % (ref 11.5–15.5)
WBC: 9.7 10*3/uL (ref 4.0–10.5)

## 2017-06-21 LAB — SURGICAL PCR SCREEN
MRSA, PCR: NEGATIVE
Staphylococcus aureus: NEGATIVE

## 2017-06-24 ENCOUNTER — Ambulatory Visit (HOSPITAL_COMMUNITY): Payer: BLUE CROSS/BLUE SHIELD | Admitting: Anesthesiology

## 2017-06-24 ENCOUNTER — Encounter (HOSPITAL_COMMUNITY)
Admission: RE | Disposition: A | Discharge status: Home / Self Care | Payer: Self-pay | Source: Ambulatory Visit | Attending: General Surgery

## 2017-06-24 ENCOUNTER — Encounter (HOSPITAL_COMMUNITY): Payer: Self-pay | Admitting: *Deleted

## 2017-06-24 ENCOUNTER — Ambulatory Visit (HOSPITAL_COMMUNITY)
Admission: RE | Admit: 2017-06-24 | Discharge: 2017-06-24 | Disposition: A | Discharge status: Home / Self Care | Payer: BLUE CROSS/BLUE SHIELD | Source: Ambulatory Visit | Attending: General Surgery | Admitting: General Surgery

## 2017-06-24 DIAGNOSIS — F17210 Nicotine dependence, cigarettes, uncomplicated: Secondary | ICD-10-CM | POA: Insufficient documentation

## 2017-06-24 DIAGNOSIS — I429 Cardiomyopathy, unspecified: Secondary | ICD-10-CM | POA: Insufficient documentation

## 2017-06-24 DIAGNOSIS — G4733 Obstructive sleep apnea (adult) (pediatric): Secondary | ICD-10-CM | POA: Insufficient documentation

## 2017-06-24 DIAGNOSIS — Z8249 Family history of ischemic heart disease and other diseases of the circulatory system: Secondary | ICD-10-CM | POA: Insufficient documentation

## 2017-06-24 DIAGNOSIS — E7800 Pure hypercholesterolemia, unspecified: Secondary | ICD-10-CM | POA: Insufficient documentation

## 2017-06-24 DIAGNOSIS — Z8349 Family history of other endocrine, nutritional and metabolic diseases: Secondary | ICD-10-CM | POA: Insufficient documentation

## 2017-06-24 DIAGNOSIS — I2510 Atherosclerotic heart disease of native coronary artery without angina pectoris: Secondary | ICD-10-CM | POA: Insufficient documentation

## 2017-06-24 DIAGNOSIS — I509 Heart failure, unspecified: Secondary | ICD-10-CM | POA: Insufficient documentation

## 2017-06-24 DIAGNOSIS — K4090 Unilateral inguinal hernia, without obstruction or gangrene, not specified as recurrent: Secondary | ICD-10-CM | POA: Diagnosis not present

## 2017-06-24 DIAGNOSIS — Z7982 Long term (current) use of aspirin: Secondary | ICD-10-CM | POA: Insufficient documentation

## 2017-06-24 DIAGNOSIS — E119 Type 2 diabetes mellitus without complications: Secondary | ICD-10-CM | POA: Insufficient documentation

## 2017-06-24 DIAGNOSIS — I110 Hypertensive heart disease with heart failure: Secondary | ICD-10-CM | POA: Insufficient documentation

## 2017-06-24 DIAGNOSIS — I252 Old myocardial infarction: Secondary | ICD-10-CM | POA: Insufficient documentation

## 2017-06-24 DIAGNOSIS — I739 Peripheral vascular disease, unspecified: Secondary | ICD-10-CM | POA: Insufficient documentation

## 2017-06-24 DIAGNOSIS — Z79899 Other long term (current) drug therapy: Secondary | ICD-10-CM | POA: Insufficient documentation

## 2017-06-24 HISTORY — PX: INGUINAL HERNIA REPAIR: SHX194

## 2017-06-24 LAB — GLUCOSE, CAPILLARY
GLUCOSE-CAPILLARY: 128 mg/dL — AB (ref 65–99)
GLUCOSE-CAPILLARY: 90 mg/dL (ref 65–99)

## 2017-06-24 SURGERY — REPAIR, HERNIA, INGUINAL, ADULT
Anesthesia: General | Site: Inguinal | Laterality: Right

## 2017-06-24 MED ORDER — GLYCOPYRROLATE 0.2 MG/ML IJ SOLN
0.2000 mg | Freq: Once | INTRAMUSCULAR | Status: AC | PRN
  Administered 2017-06-24: 0.2 mg via INTRAVENOUS

## 2017-06-24 MED ORDER — FENTANYL CITRATE (PF) 100 MCG/2ML IJ SOLN
INTRAMUSCULAR | Status: AC
  Filled 2017-06-24: qty 2

## 2017-06-24 MED ORDER — CHLORHEXIDINE GLUCONATE CLOTH 2 % EX PADS: 6.0000 | MEDICATED_PAD | Freq: Once | CUTANEOUS | Status: DC

## 2017-06-24 MED ORDER — PROPOFOL 10 MG/ML IV BOLUS
INTRAVENOUS | Status: DC | PRN
  Administered 2017-06-24: 250 mg via INTRAVENOUS

## 2017-06-24 MED ORDER — CEFAZOLIN SODIUM-DEXTROSE 2-4 GM/100ML-% IV SOLN
2.0000 g | INTRAVENOUS | Status: AC
  Administered 2017-06-24: 2 g via INTRAVENOUS

## 2017-06-24 MED ORDER — BUPIVACAINE LIPOSOME 1.3 % IJ SUSP
INTRAMUSCULAR | Status: AC
  Filled 2017-06-24: qty 20

## 2017-06-24 MED ORDER — FENTANYL CITRATE (PF) 100 MCG/2ML IJ SOLN
25.0000 ug | Freq: Once | INTRAMUSCULAR | Status: AC
  Administered 2017-06-24: 25 ug via INTRAVENOUS

## 2017-06-24 MED ORDER — ROCURONIUM BROMIDE 50 MG/5ML IV SOLN
INTRAVENOUS | Status: AC
  Filled 2017-06-24: qty 1

## 2017-06-24 MED ORDER — ONDANSETRON HCL 4 MG/2ML IJ SOLN
INTRAMUSCULAR | Status: AC
  Filled 2017-06-24: qty 2

## 2017-06-24 MED ORDER — BUPIVACAINE LIPOSOME 1.3 % IJ SUSP
INTRAMUSCULAR | Status: DC | PRN
  Administered 2017-06-24: 20 mL

## 2017-06-24 MED ORDER — SODIUM CHLORIDE 0.9 % IR SOLN
Status: DC | PRN
  Administered 2017-06-24: 1000 mL

## 2017-06-24 MED ORDER — MIDAZOLAM HCL 2 MG/2ML IJ SOLN
1.0000 mg | INTRAMUSCULAR | Status: AC
  Administered 2017-06-24: 2 mg via INTRAVENOUS

## 2017-06-24 MED ORDER — ONDANSETRON HCL 4 MG/2ML IJ SOLN
4.0000 mg | Freq: Once | INTRAMUSCULAR | Status: AC
  Administered 2017-06-24: 4 mg via INTRAVENOUS

## 2017-06-24 MED ORDER — CEFAZOLIN SODIUM-DEXTROSE 2-4 GM/100ML-% IV SOLN
INTRAVENOUS | Status: AC
Start: 2017-06-24 — End: ?
  Filled 2017-06-24: qty 100

## 2017-06-24 MED ORDER — OXYCODONE-ACETAMINOPHEN 7.5-325 MG PO TABS: 1.0000 | ORAL_TABLET | ORAL | 0 refills | Status: DC | PRN

## 2017-06-24 MED ORDER — KETOROLAC TROMETHAMINE 30 MG/ML IJ SOLN
30.0000 mg | Freq: Once | INTRAMUSCULAR | Status: AC
  Administered 2017-06-24: 30 mg via INTRAVENOUS
  Filled 2017-06-24: qty 1

## 2017-06-24 MED ORDER — FENTANYL CITRATE (PF) 100 MCG/2ML IJ SOLN
INTRAMUSCULAR | Status: DC | PRN
  Administered 2017-06-24: 25 ug via INTRAVENOUS
  Administered 2017-06-24: 50 ug via INTRAVENOUS
  Administered 2017-06-24: 100 ug via INTRAVENOUS

## 2017-06-24 MED ORDER — PROPOFOL 10 MG/ML IV BOLUS
INTRAVENOUS | Status: AC
  Filled 2017-06-24: qty 20

## 2017-06-24 MED ORDER — FENTANYL CITRATE (PF) 100 MCG/2ML IJ SOLN
25.0000 ug | INTRAMUSCULAR | Status: DC | PRN
  Administered 2017-06-24: 50 ug via INTRAVENOUS
  Filled 2017-06-24: qty 2

## 2017-06-24 MED ORDER — FENTANYL CITRATE (PF) 250 MCG/5ML IJ SOLN
INTRAMUSCULAR | Status: AC
  Filled 2017-06-24: qty 5

## 2017-06-24 MED ORDER — GLYCOPYRROLATE 0.2 MG/ML IJ SOLN
INTRAMUSCULAR | Status: AC
  Filled 2017-06-24: qty 1

## 2017-06-24 MED ORDER — MIDAZOLAM HCL 2 MG/2ML IJ SOLN
INTRAMUSCULAR | Status: AC
  Filled 2017-06-24: qty 2

## 2017-06-24 MED ORDER — LACTATED RINGERS IV SOLN
INTRAVENOUS | Status: DC
Start: 2017-06-24 — End: 2017-06-24
  Administered 2017-06-24 (×2): via INTRAVENOUS

## 2017-06-24 SURGICAL SUPPLY — 35 items
BAG HAMPER (MISCELLANEOUS) ×3 IMPLANT
CLOTH BEACON ORANGE TIMEOUT ST (SAFETY) ×3 IMPLANT
COVER LIGHT HANDLE STERIS (MISCELLANEOUS) ×6 IMPLANT
DERMABOND ADVANCED (GAUZE/BANDAGES/DRESSINGS) ×2
DERMABOND ADVANCED .7 DNX12 (GAUZE/BANDAGES/DRESSINGS) ×1 IMPLANT
DRAIN PENROSE 18X1/2 LTX STRL (DRAIN) ×3 IMPLANT
ELECT REM PT RETURN 9FT ADLT (ELECTROSURGICAL) ×3
ELECTRODE REM PT RTRN 9FT ADLT (ELECTROSURGICAL) ×1 IMPLANT
GLOVE BIOGEL PI IND STRL 7.0 (GLOVE) ×2 IMPLANT
GLOVE BIOGEL PI INDICATOR 7.0 (GLOVE) ×4
GLOVE SURG SS PI 7.5 STRL IVOR (GLOVE) ×3 IMPLANT
GOWN STRL REUS W/ TWL XL LVL3 (GOWN DISPOSABLE) ×1 IMPLANT
GOWN STRL REUS W/TWL LRG LVL3 (GOWN DISPOSABLE) ×6 IMPLANT
GOWN STRL REUS W/TWL XL LVL3 (GOWN DISPOSABLE) ×2
INST SET MINOR GENERAL (KITS) ×3 IMPLANT
KIT ROOM TURNOVER APOR (KITS) ×3 IMPLANT
MANIFOLD NEPTUNE II (INSTRUMENTS) ×3 IMPLANT
MESH HERNIA 1.6X1.9 PLUG LRG (Mesh General) ×1 IMPLANT
MESH HERNIA PLUG LRG (Mesh General) ×2 IMPLANT
NEEDLE HYPO 21X1.5 SAFETY (NEEDLE) ×3 IMPLANT
NS IRRIG 1000ML POUR BTL (IV SOLUTION) ×3 IMPLANT
PACK MINOR (CUSTOM PROCEDURE TRAY) ×3 IMPLANT
PAD ARMBOARD 7.5X6 YLW CONV (MISCELLANEOUS) ×3 IMPLANT
SET BASIN LINEN APH (SET/KITS/TRAYS/PACK) ×3 IMPLANT
SUT NOVA NAB GS-22 2 2-0 T-19 (SUTURE) ×6 IMPLANT
SUT PROLENE 2 0 SH 30 (SUTURE) IMPLANT
SUT SILK 3 0 (SUTURE)
SUT SILK 3-0 18XBRD TIE 12 (SUTURE) IMPLANT
SUT VIC AB 2-0 CT1 27 (SUTURE) ×2
SUT VIC AB 2-0 CT1 TAPERPNT 27 (SUTURE) ×1 IMPLANT
SUT VIC AB 3-0 SH 27 (SUTURE) ×2
SUT VIC AB 3-0 SH 27X BRD (SUTURE) ×1 IMPLANT
SUT VIC AB 4-0 PS2 27 (SUTURE) ×3 IMPLANT
SUT VICRYL AB 3 0 TIES (SUTURE) IMPLANT
SYR 20CC LL (SYRINGE) ×3 IMPLANT

## 2017-06-24 NOTE — Anesthesia Procedure Notes (Signed)
Procedure Name: LMA Insertion Date/Time: 06/24/2017 9:26 AM Performed by: Raenette Rover Pre-anesthesia Checklist: Patient identified, Emergency Drugs available, Suction available and Patient being monitored Patient Re-evaluated:Patient Re-evaluated prior to induction Oxygen Delivery Method: Circle system utilized Preoxygenation: Pre-oxygenation with 100% oxygen Induction Type: IV induction LMA: LMA inserted LMA Size: 5.0 Number of attempts: 1 Placement Confirmation: positive ETCO2,  CO2 detector and breath sounds checked- equal and bilateral Tube secured with: Tape Dental Injury: Teeth and Oropharynx as per pre-operative assessment

## 2017-06-24 NOTE — Discharge Instructions (Signed)
Open Hernia Repair, Adult, Care After °This sheet gives you information about how to care for yourself after your procedure. Your health care provider may also give you more specific instructions. If you have problems or questions, contact your health care provider. °What can I expect after the procedure? °After the procedure, it is common to have: °· Mild discomfort. °· Slight bruising. °· Minor swelling. °· Pain in the abdomen. ° °Follow these instructions at home: °Incision care ° °· Follow instructions from your health care provider about how to take care of your incision area. Make sure you: °? Wash your hands with soap and water before you change your bandage (dressing). If soap and water are not available, use hand sanitizer. °? Change your dressing as told by your health care provider. °? Leave stitches (sutures), skin glue, or adhesive strips in place. These skin closures may need to stay in place for 2 weeks or longer. If adhesive strip edges start to loosen and curl up, you may trim the loose edges. Do not remove adhesive strips completely unless your health care provider tells you to do that. °· Check your incision area every day for signs of infection. Check for: °? More redness, swelling, or pain. °? More fluid or blood. °? Warmth. °? Pus or a bad smell. °Activity °· Do not drive or use heavy machinery while taking prescription pain medicine. Do not drive until your health care provider approves. °· Until your health care provider approves: °? Do not lift anything that is heavier than 10 lb (4.5 kg). °? Do not play contact sports. °· Return to your normal activities as told by your health care provider. Ask your health care provider what activities are safe. °General instructions °· To prevent or treat constipation while you are taking prescription pain medicine, your health care provider may recommend that you: °? Drink enough fluid to keep your urine clear or pale yellow. °? Take over-the-counter or  prescription medicines. °? Eat foods that are high in fiber, such as fresh fruits and vegetables, whole grains, and beans. °? Limit foods that are high in fat and processed sugars, such as fried and sweet foods. °· Take over-the-counter and prescription medicines only as told by your health care provider. °· Do not take tub baths or go swimming until your health care provider approves. °· Keep all follow-up visits as told by your health care provider. This is important. °Contact a health care provider if: °· You develop a rash. °· You have more redness, swelling, or pain around your incision. °· You have more fluid or blood coming from your incision. °· Your incision feels warm to the touch. °· You have pus or a bad smell coming from your incision. °· You have a fever or chills. °· You have blood in your stool (feces). °· You have not had a bowel movement in 2-3 days. °· Your pain is not controlled with medicine. °Get help right away if: °· You have chest pain or shortness of breath. °· You feel light-headed or feel faint. °· You have severe pain. °· You vomit and your pain is worse. °This information is not intended to replace advice given to you by your health care provider. Make sure you discuss any questions you have with your health care provider. °Document Released: 04/20/2005 Document Revised: 04/20/2016 Document Reviewed: 03/14/2016 °Elsevier Interactive Patient Education © 2017 [employer] ° °PATIENT INSTRUCTIONS °POST-ANESTHESIA ° °IMMEDIATELY FOLLOWING SURGERY:  Do not drive or operate machinery for the   first twenty four hours after surgery.  Do not make any important decisions for twenty four hours after surgery or while taking narcotic pain medications or sedatives.  If you develop intractable nausea and vomiting or a severe headache please notify your doctor immediately. ° °FOLLOW-UP:  Please make an appointment with your surgeon as instructed. You do not need to follow up with anesthesia unless  specifically instructed to do so. ° °WOUND CARE INSTRUCTIONS (if applicable):  Keep a dry clean dressing on the anesthesia/puncture wound site if there is drainage.  Once the wound has quit draining you may leave it open to air.  Generally you should leave the bandage intact for twenty four hours unless there is drainage.  If the epidural site drains for more than 36-48 hours please call the anesthesia department. ° °QUESTIONS?:  Please feel free to call your physician or the hospital operator if you have any questions, and they will be happy to assist you.    ° ° ° ° °

## 2017-06-24 NOTE — Anesthesia Postprocedure Evaluation (Signed)
Anesthesia Post Note  Patient: James [name]  Procedure(s) Performed: Procedure(s) (LRB): HERNIA REPAIR INGUINAL ADULT WITH MESH (Right)  Patient location during evaluation: PACU Anesthesia Type: General Level of consciousness: awake, awake and alert, oriented, lethargic and patient cooperative Pain management: pain level controlled Vital Signs Assessment: post-procedure vital signs reviewed and stable Respiratory status: spontaneous breathing, nonlabored ventilation and respiratory function stable Cardiovascular status: stable Postop Assessment: no signs of nausea or vomiting Anesthetic complications: no     Last Vitals:  Vitals:   06/24/17 0845 06/24/17 0850  BP: 140/78   Pulse:    Resp: (!) 24 (!) 32  Temp:    SpO2: 96% 96%    Last Pain:  Vitals:   06/24/17 0814  TempSrc: Oral                 Caterine Mcmeans L

## 2017-06-24 NOTE — Interval H&P Note (Signed)
History and Physical Interval Note:  06/24/2017 8:46 AM  James [name]  has presented today for surgery, with the diagnosis of right inguinal hernia  The various methods of treatment have been discussed with the patient and family. After consideration of risks, benefits and other options for treatment, the patient has consented to  Procedure(s): HERNIA REPAIR INGUINAL ADULT WITH MESH (Right) as a surgical intervention .  The patient's history has been reviewed, patient examined, no change in status, stable for surgery.  I have reviewed the patient's chart and labs.  Questions were answered to the patient's satisfaction.     Aviva Signs

## 2017-06-24 NOTE — Anesthesia Preprocedure Evaluation (Signed)
Anesthesia Evaluation  Patient identified by MRN, date of birth, ID band Patient awake    Reviewed: Allergy & Precautions, NPO status , Patient's Chart, lab work & pertinent test results, reviewed documented beta blocker date and time   Airway Mallampati: I  TM Distance: >3 FB     Dental  (+) Teeth Intact   Pulmonary sleep apnea , Current Smoker,    breath sounds clear to auscultation       Cardiovascular hypertension, Pt. on medications and Pt. on home beta blockers + Past MI, + Peripheral Vascular Disease and +CHF (2015 EF 45-50%, diffuse hypokenesis, aortic root dilatation.)   Rhythm:Regular Rate:Normal     Neuro/Psych negative psych ROS   GI/Hepatic negative GI ROS, Neg liver ROS,   Endo/Other  diabetes, Type 2  Renal/GU      Musculoskeletal   Abdominal   Peds  Hematology   Anesthesia Other Findings   Reproductive/Obstetrics                             Anesthesia Physical Anesthesia Plan  ASA: III  Anesthesia Plan: General   Post-op Pain Management:    Induction: Intravenous  PONV Risk Score and Plan:   Airway Management Planned: LMA  Additional Equipment:   Intra-op Plan:   Post-operative Plan: Extubation in OR  Informed Consent: I have reviewed the patients History and Physical, chart, labs and discussed the procedure including the risks, benefits and alternatives for the proposed anesthesia with the patient or authorized representative who has indicated his/her understanding and acceptance.     Plan Discussed with:   Anesthesia Plan Comments:         Anesthesia Quick Evaluation

## 2017-06-24 NOTE — Op Note (Signed)
Patient:  James [name]  DOB:  Mar 05, 1961  MRN:  [id number]   Preop Diagnosis:  Right inguinal hernia  Postop Diagnosis:  Same  Procedure:  Right inguinal herniorrhaphy with mesh  Surgeon:  Aviva Signs, M.D.  Asst.: Curlene Labrum, M.D.  Anes:  Gen.  Indications:  Patient is a 56 year old black male who presents with a symptomatic right inguinal hernia. The risks and benefits of the procedure including bleeding, infection, mesh use, and the possibility of recurrence of the hernia were fully explained to the patient, who gave informed consent.  Procedure note:  The patient was placed in the supine position. After general anesthesia was administered, the right groin region was prepped and draped using the usual sterile technique with [employer]. Surgical site confirmation was performed.  An incision was made in the right groin region down to the external oblique aponeuroses. The aponeuroses was incised to the external ring. A Penrose drain was placed around spermatic cord. The vase deference was noted within the spermatic cord. The ilioinguinal nerve was identified and retracted inferiorly from the operative field. A large indirect hernia sac was found. This was separated from the spermatic cord up to the peritoneal reflection and inverted. A large Bard PerFix plug was then inserted. An onlay patch was then placed along the floor of the inguinal canal and secured superiorly to the conjoined tendon and inferiorly to the shelving edge of Poupart's ligament using 2-0 Novafil interrupted sutures. The internal ring was re-created using a 2-0 Novafil interrupted suture. The external oblique aponeuroses was reapproximated using a 2-0 Vicryl running suture. Subcutaneous layer was reapproximated using a 3-0 Vicryl interrupted suture.  Exaprel was instilled into the surrounding wound. The skin was closed using a 4-0 Vicryl subcuticular suture. Dermabond was applied.  All tape and needle counts were  correct at the end of the procedure. The patient was awakened and transferred to PACU in stable condition.  Complications:  None  EBL:  Minimal  Specimen:  None

## 2017-06-24 NOTE — Transfer of Care (Signed)
Immediate Anesthesia Transfer of Care Note  Patient: James [name]  Procedure(s) Performed: Procedure(s): HERNIA REPAIR INGUINAL ADULT WITH MESH (Right)  Patient Location: PACU  Anesthesia Type:General  Level of Consciousness: awake, alert , oriented, drowsy and patient cooperative  Airway & Oxygen Therapy: Patient Spontanous Breathing and Patient connected to face mask oxygen  Post-op Assessment: Report given to RN and Post -op Vital signs reviewed and stable  Post vital signs: Reviewed and stable  Last Vitals:  Vitals:   06/24/17 0845 06/24/17 0850  BP: 140/78   Pulse:    Resp: (!) 24 (!) 32  Temp:    SpO2: 96% 96%    Last Pain:  Vitals:   06/24/17 0814  TempSrc: Oral      Patients Stated Pain Goal: 4 (12/75/17 0017)  Complications: No apparent anesthesia complications

## 2017-06-25 ENCOUNTER — Encounter (HOSPITAL_COMMUNITY): Payer: Self-pay | Admitting: General Surgery

## 2017-07-02 ENCOUNTER — Encounter: Payer: Self-pay | Admitting: General Surgery

## 2017-07-02 ENCOUNTER — Ambulatory Visit (INDEPENDENT_AMBULATORY_CARE_PROVIDER_SITE_OTHER): Payer: Self-pay | Admitting: General Surgery

## 2017-07-02 VITALS — BP 133/75 | HR 69 | Temp 97.9°F | Resp 18 | Ht 78.0 in | Wt 270.0 lb

## 2017-07-02 DIAGNOSIS — Z09 Encounter for follow-up examination after completed treatment for conditions other than malignant neoplasm: Secondary | ICD-10-CM

## 2017-07-02 NOTE — Progress Notes (Signed)
Subjective:     James [name]  Status post right inguinal herniorrhaphy with mesh. Doing well. No significant pain. Objective:    BP 133/75   Pulse 69   Temp 97.9 F (36.6 C)   Resp 18   Ht 6\' 6"  (1.981 m)   Wt 270 lb (122.5 kg)   BMI 31.20 kg/m   General:  alert, cooperative and no distress  Abdomen soft, incision healing well.     Assessment:    Doing well postoperatively.    Plan:   Increase activity as able. May return to work without restrictions on 07/16/2017.

## 2017-11-05 DIAGNOSIS — E119 Type 2 diabetes mellitus without complications: Secondary | ICD-10-CM | POA: Diagnosis not present

## 2017-11-05 DIAGNOSIS — I2510 Atherosclerotic heart disease of native coronary artery without angina pectoris: Secondary | ICD-10-CM | POA: Diagnosis not present

## 2017-11-05 DIAGNOSIS — F17210 Nicotine dependence, cigarettes, uncomplicated: Secondary | ICD-10-CM | POA: Diagnosis not present

## 2017-11-05 DIAGNOSIS — I10 Essential (primary) hypertension: Secondary | ICD-10-CM | POA: Diagnosis not present

## 2017-11-05 DIAGNOSIS — J209 Acute bronchitis, unspecified: Secondary | ICD-10-CM | POA: Diagnosis not present

## 2018-05-13 DIAGNOSIS — I2510 Atherosclerotic heart disease of native coronary artery without angina pectoris: Secondary | ICD-10-CM | POA: Diagnosis not present

## 2018-05-13 DIAGNOSIS — E1165 Type 2 diabetes mellitus with hyperglycemia: Secondary | ICD-10-CM | POA: Diagnosis not present

## 2018-05-13 DIAGNOSIS — E119 Type 2 diabetes mellitus without complications: Secondary | ICD-10-CM | POA: Diagnosis not present

## 2018-05-13 DIAGNOSIS — I10 Essential (primary) hypertension: Secondary | ICD-10-CM | POA: Diagnosis not present

## 2018-05-13 DIAGNOSIS — Z0001 Encounter for general adult medical examination with abnormal findings: Secondary | ICD-10-CM | POA: Diagnosis not present

## 2018-05-13 DIAGNOSIS — I499 Cardiac arrhythmia, unspecified: Secondary | ICD-10-CM | POA: Diagnosis not present

## 2018-05-13 DIAGNOSIS — I509 Heart failure, unspecified: Secondary | ICD-10-CM | POA: Diagnosis not present

## 2018-12-26 DIAGNOSIS — E119 Type 2 diabetes mellitus without complications: Secondary | ICD-10-CM | POA: Diagnosis not present

## 2018-12-26 DIAGNOSIS — I2510 Atherosclerotic heart disease of native coronary artery without angina pectoris: Secondary | ICD-10-CM | POA: Diagnosis not present

## 2018-12-26 DIAGNOSIS — I10 Essential (primary) hypertension: Secondary | ICD-10-CM | POA: Diagnosis not present

## 2018-12-26 DIAGNOSIS — F17200 Nicotine dependence, unspecified, uncomplicated: Secondary | ICD-10-CM | POA: Diagnosis not present

## 2019-11-16 DIAGNOSIS — B351 Tinea unguium: Secondary | ICD-10-CM | POA: Diagnosis not present

## 2019-11-16 DIAGNOSIS — E1140 Type 2 diabetes mellitus with diabetic neuropathy, unspecified: Secondary | ICD-10-CM | POA: Diagnosis not present

## 2019-11-16 DIAGNOSIS — B353 Tinea pedis: Secondary | ICD-10-CM | POA: Diagnosis not present

## 2019-11-16 DIAGNOSIS — L851 Acquired keratosis [keratoderma] palmaris et plantaris: Secondary | ICD-10-CM | POA: Diagnosis not present

## 2019-12-01 DIAGNOSIS — I2510 Atherosclerotic heart disease of native coronary artery without angina pectoris: Secondary | ICD-10-CM | POA: Diagnosis not present

## 2019-12-01 DIAGNOSIS — F17200 Nicotine dependence, unspecified, uncomplicated: Secondary | ICD-10-CM | POA: Diagnosis not present

## 2019-12-01 DIAGNOSIS — Z0001 Encounter for general adult medical examination with abnormal findings: Secondary | ICD-10-CM | POA: Diagnosis not present

## 2019-12-01 DIAGNOSIS — F17210 Nicotine dependence, cigarettes, uncomplicated: Secondary | ICD-10-CM | POA: Diagnosis not present

## 2019-12-01 DIAGNOSIS — E119 Type 2 diabetes mellitus without complications: Secondary | ICD-10-CM | POA: Diagnosis not present

## 2020-02-02 ENCOUNTER — Emergency Department (HOSPITAL_COMMUNITY)
Admission: EM | Admit: 2020-02-02 | Discharge: 2020-02-02 | Disposition: A | Discharge status: Home / Self Care | Payer: BC Managed Care – PPO | Attending: Emergency Medicine | Admitting: Emergency Medicine

## 2020-02-02 ENCOUNTER — Encounter (HOSPITAL_COMMUNITY): Payer: Self-pay | Admitting: Emergency Medicine

## 2020-02-02 ENCOUNTER — Other Ambulatory Visit: Payer: Self-pay

## 2020-02-02 DIAGNOSIS — I252 Old myocardial infarction: Secondary | ICD-10-CM | POA: Insufficient documentation

## 2020-02-02 DIAGNOSIS — I10 Essential (primary) hypertension: Secondary | ICD-10-CM | POA: Insufficient documentation

## 2020-02-02 DIAGNOSIS — L0291 Cutaneous abscess, unspecified: Secondary | ICD-10-CM

## 2020-02-02 DIAGNOSIS — R222 Localized swelling, mass and lump, trunk: Secondary | ICD-10-CM | POA: Diagnosis not present

## 2020-02-02 DIAGNOSIS — I509 Heart failure, unspecified: Secondary | ICD-10-CM | POA: Diagnosis not present

## 2020-02-02 DIAGNOSIS — I2510 Atherosclerotic heart disease of native coronary artery without angina pectoris: Secondary | ICD-10-CM | POA: Diagnosis not present

## 2020-02-02 DIAGNOSIS — F17210 Nicotine dependence, cigarettes, uncomplicated: Secondary | ICD-10-CM | POA: Diagnosis not present

## 2020-02-02 DIAGNOSIS — L0231 Cutaneous abscess of buttock: Secondary | ICD-10-CM | POA: Diagnosis not present

## 2020-02-02 DIAGNOSIS — Z7984 Long term (current) use of oral hypoglycemic drugs: Secondary | ICD-10-CM | POA: Diagnosis not present

## 2020-02-02 DIAGNOSIS — E119 Type 2 diabetes mellitus without complications: Secondary | ICD-10-CM | POA: Insufficient documentation

## 2020-02-02 DIAGNOSIS — Z79899 Other long term (current) drug therapy: Secondary | ICD-10-CM | POA: Insufficient documentation

## 2020-02-02 LAB — CBC WITH DIFFERENTIAL/PLATELET
Abs Immature Granulocytes: 0.05 10*3/uL (ref 0.00–0.07)
Basophils Absolute: 0 10*3/uL (ref 0.0–0.1)
Basophils Relative: 0 %
Eosinophils Absolute: 0 10*3/uL (ref 0.0–0.5)
Eosinophils Relative: 0 %
HCT: 44.8 % (ref 39.0–52.0)
Hemoglobin: 15.1 g/dL (ref 13.0–17.0)
Immature Granulocytes: 0 %
Lymphocytes Relative: 12 %
Lymphs Abs: 1.6 10*3/uL (ref 0.7–4.0)
MCH: 31.7 pg (ref 26.0–34.0)
MCHC: 33.7 g/dL (ref 30.0–36.0)
MCV: 93.9 fL (ref 80.0–100.0)
Monocytes Absolute: 1.2 10*3/uL — ABNORMAL HIGH (ref 0.1–1.0)
Monocytes Relative: 9 %
Neutro Abs: 10.6 10*3/uL — ABNORMAL HIGH (ref 1.7–7.7)
Neutrophils Relative %: 79 %
Platelets: 158 10*3/uL (ref 150–400)
RBC: 4.77 MIL/uL (ref 4.22–5.81)
RDW: 13.2 % (ref 11.5–15.5)
WBC: 13.6 10*3/uL — ABNORMAL HIGH (ref 4.0–10.5)
nRBC: 0 % (ref 0.0–0.2)

## 2020-02-02 LAB — BASIC METABOLIC PANEL
Anion gap: 8 (ref 5–15)
BUN: 14 mg/dL (ref 6–20)
CO2: 25 mmol/L (ref 22–32)
Calcium: 8.3 mg/dL — ABNORMAL LOW (ref 8.9–10.3)
Chloride: 103 mmol/L (ref 98–111)
Creatinine, Ser: 1.14 mg/dL (ref 0.61–1.24)
GFR calc Af Amer: >60 mL/min (ref >60)
GFR calc non Af Amer: >60 mL/min (ref >60)
Glucose, Bld: 104 mg/dL — ABNORMAL HIGH (ref 70–99)
Potassium: 3.4 mmol/L — ABNORMAL LOW (ref 3.5–5.1)
Sodium: 136 mmol/L (ref 135–145)

## 2020-02-02 MED ORDER — LIDOCAINE-EPINEPHRINE (PF) 1 %-1:200000 IJ SOLN: 20.0000 mL | Freq: Once | INTRAMUSCULAR | Status: DC

## 2020-02-02 MED ORDER — LIDOCAINE-EPINEPHRINE (PF) 1 %-1:200000 IJ SOLN
20.0000 mL | Freq: Once | INTRAMUSCULAR | Status: AC
  Administered 2020-02-02: 20 mL via INTRADERMAL
  Filled 2020-02-02: qty 30

## 2020-02-02 MED ORDER — SODIUM CHLORIDE 0.9 % IV BOLUS: 1000.0000 mL | Freq: Once | INTRAVENOUS | Status: DC

## 2020-02-02 MED ORDER — SODIUM CHLORIDE 0.9 % IV SOLN: 2.0000 g | Freq: Once | INTRAVENOUS | Status: DC

## 2020-02-02 MED ORDER — ACETAMINOPHEN 500 MG PO TABS
1000.0000 mg | ORAL_TABLET | Freq: Once | ORAL | Status: AC
  Administered 2020-02-02: 1000 mg via ORAL
  Filled 2020-02-02: qty 2

## 2020-02-02 MED ORDER — DOXYCYCLINE HYCLATE 100 MG PO CAPS: 100.0000 mg | ORAL_CAPSULE | Freq: Two times a day (BID) | ORAL | 0 refills | Status: AC

## 2020-02-02 MED ORDER — DOXYCYCLINE HYCLATE 100 MG PO TABS
100.0000 mg | ORAL_TABLET | Freq: Once | ORAL | Status: AC
  Administered 2020-02-02: 100 mg via ORAL
  Filled 2020-02-02: qty 1

## 2020-02-02 NOTE — Discharge Instructions (Addendum)
You were given a prescription for antibiotics. Please take the antibiotic prescription fully.   Please make sure to do warm soaks at home to help encourage drainage from the abscess.  The wound was packed with iodoform gauze.  You will need to return to the emergency department in 2 days to have the packing removed.  You should return sooner for any new or worsening symptoms.

## 2020-02-02 NOTE — ED Notes (Signed)
Pt in bed, dressing placed, pt states that he is ready to go home, pt verbalized understanding d/c instructions and follow up, advised to return for any concerns or worsening symptoms.  Work note given.  Pt ambulatory from dpt with wife.

## 2020-02-02 NOTE — ED Triage Notes (Signed)
Patient presents with an abscess on his buttocks that appeared 2 days ago. Patient does have a fever of 101.0 oral. Patient states the abscess is the size of a golf ball.

## 2020-02-02 NOTE — ED Provider Notes (Signed)
This patient is a well-appearing 59 year old male, known diabetic, on Metformin.  He presents with a fever, has had pain in his right buttock for approximately 2 days.  On exam the patient has a heart rate of about 85 bpm with strong radial artery pulses, he is not hypotensive, he is not tachycardic, he is febrile to 101.  He does have a fluctuant area on the right buttock, this is not perirectal and does not involve the perineum, there is no involvement of the penis scrotum or testicles.  There is a very focal fluctuant area with minimal surrounding redness.  He has no abdominal tenderness, no other signs or symptoms of infection including coughing shortness of breath dysuria diarrhea or any other exposure to sick illnesses.  He has had multiple antibiotics in the past for multiple different abscesses and is always done very well.  I think that the patient will do well with incision and drainage oral antibiotics and discharge home, he is tolerating food and fluids without any nausea or vomiting, he is not tachycardic or hypotensive.  He is agreeable to the plan.  I personally supervised the I&D performed by Ms. Couture.  Medical screening examination/treatment/procedure(s) were conducted as a shared visit with non-physician practitioner(s) and myself.  I personally evaluated the patient during the encounter.  Clinical Impression:   Final diagnoses:  Abscess         Noemi Chapel, MD 02/06/20 [phone]

## 2020-02-02 NOTE — ED Provider Notes (Addendum)
[facility] EMERGENCY DEPARTMENT Provider Note   CSN: [id number] Arrival date & time: 02/02/20  1845     History Chief Complaint  Patient presents with  . Abscess    buttocks    James [name] is a 59 y.o. male.  HPI   Patient is a 59 year old male with a history of ASCVD, cardiomyopathy, diabetes, hyper tension, hyperlipidemia, MI, OSA, tobacco use, who presents the emergency department today for evaluation of an abscess.  States he has had swelling and pain to the right upper posterior thigh/lower buttock area that has worsened since onset.  He has had no drainage from the wound.  He has not had any fevers at home or chills.  He does not have any abdominal pain, nausea, vomiting, or any other systemic symptoms.  Past Medical History:  Diagnosis Date  . Arteriosclerotic cardiovascular disease (ASCVD)    Insignificant disease in 2002 and 2013  . Cardiomyopathy ([place])    CHF and EF of 25% in 02/2012; normal TSH  . Diabetes mellitus without complication ([place])   . Hypercholesteremia   . Hypertension   . Myocardial infarction ([place])    2000 MI  . Obstructive sleep apnea    was positive but never given a CPAP  . Tobacco abuse     Patient Active Problem List   Diagnosis Date Noted  . Non-recurrent unilateral inguinal hernia without obstruction or gangrene   . Hyperlipidemia 11/04/2012  . Aortic root dilatation ([place]) 08/05/2012  . Cholelithiasis   . Obstructive sleep apnea 04/28/2012  . Cardiomyopathy, nonischemic ([place]) 04/03/2012  . Hypertension 08/07/2010  . 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/07/2010    Past Surgical History:  Procedure Laterality Date  . CARDIAC CATHETERIZATION  2002  . COLONOSCOPY N/A 03/22/2014   Procedure: COLONOSCOPY;  Surgeon: Danie Binder, MD;  Location: [facility] ENDO SUITE;  Service: Endoscopy;  Laterality: N/A;  11:15 AM  . INGUINAL HERNIA REPAIR Right 06/24/2017   Procedure: HERNIA REPAIR INGUINAL ADULT WITH MESH;  Surgeon:  Aviva Signs, MD;  Location: [facility] ORS;  Service: General;  Laterality: Right;       Family History  Problem Relation Age of Onset  . Heart attack Father   . Hypertension Father   . Iron deficiency Father   . Coronary artery disease Mother   . Heart disease Maternal Grandfather        Also paternal grandfather  . Cardiomyopathy Brother        No significant coronary disease by coronary angiography  . Thyroid disease Sister   . Sudden death Other   . Colon cancer Neg Hx     Social History   Tobacco Use  . Smoking status: Current Every Day Smoker    Packs/day: 0.50    Years: 32.00    Pack years: 16.00    Types: Cigarettes    Start date: 10/15/1982  . Smokeless tobacco: Never Used  . Tobacco comment: 3 cigaretts a day  Substance Use Topics  . Alcohol use: No    Alcohol/week: 0.0 standard drinks  . Drug use: No    Home Medications Prior to Admission medications   Medication Sig Start Date End Date Taking? Authorizing Provider  amLODipine (NORVASC) 10 MG tablet Take 10 mg by mouth daily.    [provider]  aspirin 81 MG tablet Take 81 mg by mouth daily.    [provider]  atorvastatin (LIPITOR) 40 MG tablet Take 40 mg by mouth daily.    [provider]  carvedilol (COREG) 25 MG tablet Take 2 tablets (50 mg total) by mouth 2 (two) times daily. 03/31/13 06/24/17  Yehuda Savannah, MD  doxycycline (VIBRAMYCIN) 100 MG capsule Take 1 capsule (100 mg total) by mouth 2 (two) times daily for 7 days. 02/02/20 02/09/20  Trequan Marsolek S, PA-C  furosemide (LASIX) 40 MG tablet TAKE 1 TABLET (40 MG TOTAL) BY MOUTH EVERY MORNING. 08/02/13   Arnoldo Lenis, MD  isosorbide mononitrate (IMDUR) 120 MG 24 hr tablet TAKE 1 TABLET BY MOUTH EVERY DAY 01/18/14   Lendon Colonel, NP  lisinopril (PRINIVIL,ZESTRIL) 40 MG tablet TAKE 1 TABLET BY MOUTH EVERY DAY 01/18/14   Lendon Colonel, NP  metFORMIN (GLUCOPHAGE) 500 MG tablet Take by mouth 2 (two) times daily with a  meal.    [provider]  methocarbamol (ROBAXIN) 500 MG tablet Take 1 tablet (500 mg total) by mouth every 8 (eight) hours as needed for muscle spasms. 11/25/16   Davonna Belling, MD  oxyCODONE-acetaminophen (PERCOCET) 7.5-325 MG tablet Take 1-2 tablets by mouth every 4 (four) hours as needed. 06/24/17   Aviva Signs, MD  pantoprazole (PROTONIX) 40 MG tablet TAKE 1 TABLET (40 MG TOTAL) BY MOUTH DAILY. 03/22/13   Lendon Colonel, NP  pravastatin (PRAVACHOL) 40 MG tablet Take 1 tablet (40 mg total) by mouth every evening. 10/31/12   Yehuda Savannah, MD  spironolactone (ALDACTONE) 25 MG tablet TAKE 1 TABLET (25 MG TOTAL) BY MOUTH DAILY. 11/14/15   Herminio Commons, MD    Allergies    Patient has no known allergies.  Review of Systems   Review of Systems  Constitutional: Negative for fever.  Respiratory: Negative for shortness of breath.   Cardiovascular: Negative for chest pain.  Gastrointestinal: Negative for abdominal pain, constipation, diarrhea, nausea and vomiting.  Musculoskeletal: Negative for back pain.  Skin: Positive for color change.       abscess  Neurological: Negative for headaches.    Physical Exam Updated Vital Signs BP 126/81 (BP Location: Left Arm)   Pulse 77   Temp 99.9 F (37.7 C) (Oral)   Resp 18   Ht 6\' 6"  (1.981 m)   Wt 122.5 kg   SpO2 97%   BMI 31.20 kg/m   Physical Exam Constitutional:      General: He is not in acute distress.    Appearance: He is well-developed.  Eyes:     Conjunctiva/sclera: Conjunctivae normal.  Cardiovascular:     Rate and Rhythm: Normal rate and regular rhythm.  Pulmonary:     Effort: Pulmonary effort is normal.     Breath sounds: Normal breath sounds.  Skin:    General: Skin is warm and dry.     Comments: 2x4 cm well circumscribed area of fluctuance to the right upper thigh/lower buttock area. There is some surrounding induration but this does not track towards the scrotum or rectum.   Neurological:      Mental Status: He is alert and oriented to person, place, and time.     ED Results / Procedures / Treatments   Labs (all labs ordered are listed, but only abnormal results are displayed) Labs Reviewed  CBC WITH DIFFERENTIAL/PLATELET - Abnormal; Notable for the following components:      Result Value   WBC 13.6 (*)    Neutro Abs 10.6 (*)    Monocytes Absolute 1.2 (*)    All other components within normal limits  BASIC METABOLIC PANEL - Abnormal; Notable for  the following components:   Potassium 3.4 (*)    Glucose, Bld 104 (*)    Calcium 8.3 (*)    All other components within normal limits    EKG None  Radiology No results found.  Procedures .Marland KitchenIncision and Drainage  Date/Time: 02/02/2020 9:32 PM Performed by: Rodney Booze, PA-C Authorized by: Rodney Booze, PA-C   Consent:    Consent obtained:  Verbal   Consent given by:  Patient   Risks discussed:  Bleeding, incomplete drainage, pain and damage to other organs   Alternatives discussed:  No treatment Universal protocol:    Procedure explained and questions answered to patient or proxy's satisfaction: yes     Relevant documents present and verified: yes     Test results available and properly labeled: yes     Imaging studies available: yes     Required blood products, implants, devices, and special equipment available: yes     Site/side marked: yes     Immediately prior to procedure a time out was called: yes     Patient identity confirmed:  Verbally with patient Location:    Type:  Abscess   Size:  2x4cm   Location:  Lower extremity   Lower extremity location:  Buttock   Buttock location:  R buttock Pre-procedure details:    Skin preparation:  Chloraprep Anesthesia (see MAR for exact dosages):    Anesthesia method:  Local infiltration   Local anesthetic:  Lidocaine 2% WITH epi Procedure type:    Complexity:  Complex Procedure details:    Incision types:  Single straight   Incision depth:   Subcutaneous   Scalpel blade:  11   Wound management:  Probed and deloculated   Drainage:  Purulent   Drainage amount:  Copious   Packing materials:  1/4 in iodoform gauze Post-procedure details:    Patient tolerance of procedure:  Tolerated well, no immediate complications   (including critical care time)  Medications Ordered in ED Medications  acetaminophen (TYLENOL) tablet 1,000 mg (1,000 mg Oral Given 02/02/20 2019)  lidocaine-EPINEPHrine (XYLOCAINE-EPINEPHrine) 1 %-1:200000 (PF) injection 20 mL (20 mLs Intradermal Given by Other 02/02/20 2019)  doxycycline (VIBRA-TABS) tablet 100 mg (100 mg Oral Given 02/02/20 2020)    ED Course  I have reviewed the triage vital signs and the nursing notes.  Pertinent labs & imaging results that were available during my care of the patient were reviewed by me and considered in my medical decision making (see chart for details).    MDM Rules/Calculators/A&P                      Well appearing male with with skin abscess to the left upper thigh/buttock area. No involvement of the perineal area. He is very well appearing and is not significantly tachycardic or hypotensive.  Low suspicion for sepsis. abscess is well circumscribed and was amenable to incision and drainage. There are no obvious complicating features to this abscess. Do wound was packed with iodoform gauze.  Advised to return for wound recheck in 2 days. Encouraged home warm soaks and flushing.  Mild signs of cellulitis is surrounding skin.  Will d/c to home with abx given his hx of diabetes.  Advised on specific return precautions.  He voiced understanding plan reasons return.  all questions answered.  Patient stable for discharge.  Final Clinical Impression(s) / ED Diagnoses Final diagnoses:  Abscess    Rx / DC Orders ED Discharge Orders  Ordered    doxycycline (VIBRAMYCIN) 100 MG capsule  2 times daily     02/02/20 2130           Rodney Booze, PA-C 02/02/20  2130    Bishop [place] 02/02/20 2133    Noemi Chapel, MD 02/06/20 [phone]

## 2020-02-04 ENCOUNTER — Emergency Department (HOSPITAL_COMMUNITY)
Admission: EM | Admit: 2020-02-04 | Discharge: 2020-02-04 | Disposition: A | Discharge status: Home / Self Care | Payer: BC Managed Care – PPO | Attending: Emergency Medicine | Admitting: Emergency Medicine

## 2020-02-04 ENCOUNTER — Other Ambulatory Visit: Payer: Self-pay

## 2020-02-04 ENCOUNTER — Encounter (HOSPITAL_COMMUNITY): Payer: Self-pay

## 2020-02-04 DIAGNOSIS — Z4800 Encounter for change or removal of nonsurgical wound dressing: Secondary | ICD-10-CM | POA: Diagnosis not present

## 2020-02-04 DIAGNOSIS — F17210 Nicotine dependence, cigarettes, uncomplicated: Secondary | ICD-10-CM | POA: Insufficient documentation

## 2020-02-04 DIAGNOSIS — Z79899 Other long term (current) drug therapy: Secondary | ICD-10-CM | POA: Diagnosis not present

## 2020-02-04 DIAGNOSIS — I10 Essential (primary) hypertension: Secondary | ICD-10-CM | POA: Insufficient documentation

## 2020-02-04 DIAGNOSIS — L0231 Cutaneous abscess of buttock: Secondary | ICD-10-CM | POA: Insufficient documentation

## 2020-02-04 DIAGNOSIS — E119 Type 2 diabetes mellitus without complications: Secondary | ICD-10-CM | POA: Insufficient documentation

## 2020-02-04 DIAGNOSIS — Z7984 Long term (current) use of oral hypoglycemic drugs: Secondary | ICD-10-CM | POA: Diagnosis not present

## 2020-02-04 DIAGNOSIS — Z7982 Long term (current) use of aspirin: Secondary | ICD-10-CM | POA: Diagnosis not present

## 2020-02-04 DIAGNOSIS — Z5189 Encounter for other specified aftercare: Secondary | ICD-10-CM

## 2020-02-04 NOTE — Discharge Instructions (Signed)
Your abscess appears to be well-healing.  Continue with the warm soaks and antibiotics at home.  If you notice any hardened skin, increased pain especially if radiates into your rectum or your scrotum you need to seek reevaluation.  Follow-up regularly with your PCP

## 2020-02-04 NOTE — ED Triage Notes (Addendum)
Pt presents to ED for recheck of abscess to buttocks that was drained 2 days ago. States was told to have packing removed from wound in ED.   Denies fevers, increased pain or swelling. Reports wound stopped draining yesterday.   Pain only when sitting down on buttocks.

## 2020-02-04 NOTE — ED Provider Notes (Signed)
[name] EMERGENCY DEPARTMENT Provider Note   CSN: [id number] Arrival date & time: 02/04/20  1201     History Chief Complaint  Patient presents with  . Wound Check    James [name] is a 59 y.o. male with past history significant for cardiomyopathy, diabetes on Metformin, tobacco abuse who [place] for evaluation of wound check.  Patient had abscess drained from cleft of his right buttocks posterior thigh 2 days ago.  States significant improvement in pain.  Now only has pain if he is sitting on that area.  He is not certain the antibiotics that he was prescribed due to lack of coverage from the pharmacy.  He did recently pick these up today to start these.  States he thinks his packing might have fallen out last night however he is not sure.  States he feels like the induration has decreased.  His drainage stopped last night as well.  He has been doing warm compresses and baths.  Denies fever, chills, nausea, vomiting, abdominal pain, pelvic pain, rectal pain, testicular pain, redness or swelling to his testicles or rectal area.  No pain with bowel movements.  Denies additional aggravating relieving factors.  Rates his pain a 4 when he sits on wound however he 0/10 if he is standing or laying.  History obtained from patient and past medical records.  No interpreter is used.  HPI     Past Medical History:  Diagnosis Date  . Arteriosclerotic cardiovascular disease (ASCVD)    Insignificant disease in 2002 and 2013  . Cardiomyopathy ([place])    CHF and EF of 25% in 02/2012; normal TSH  . Diabetes mellitus without complication ([place])   . Hypercholesteremia   . Hypertension   . Myocardial infarction ([place])    2000 MI  . Obstructive sleep apnea    was positive but never given a CPAP  . Tobacco abuse     Patient Active Problem List   Diagnosis Date Noted  . Non-recurrent unilateral inguinal hernia without obstruction or gangrene   . Hyperlipidemia 11/04/2012  . Aortic root dilatation  ([place]) 08/05/2012  . Cholelithiasis   . Obstructive sleep apnea 04/28/2012  . Cardiomyopathy, nonischemic ([place]) 04/03/2012  . Hypertension 08/07/2010  . 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/07/2010    Past Surgical History:  Procedure Laterality Date  . CARDIAC CATHETERIZATION  2002  . COLONOSCOPY N/A 03/22/2014   Procedure: COLONOSCOPY;  Surgeon: Danie Binder, MD;  Location: [facility] ENDO SUITE;  Service: Endoscopy;  Laterality: N/A;  11:15 AM  . INGUINAL HERNIA REPAIR Right 06/24/2017   Procedure: HERNIA REPAIR INGUINAL ADULT WITH MESH;  Surgeon: Aviva Signs, MD;  Location: [facility] ORS;  Service: General;  Laterality: Right;       Family History  Problem Relation Age of Onset  . Heart attack Father   . Hypertension Father   . Iron deficiency Father   . Coronary artery disease Mother   . Heart disease Maternal Grandfather        Also paternal grandfather  . Cardiomyopathy Brother        No significant coronary disease by coronary angiography  . Thyroid disease Sister   . Sudden death Other   . Colon cancer Neg Hx     Social History   Tobacco Use  . Smoking status: Current Every Day Smoker    Packs/day: 0.50    Years: 32.00    Pack years: 16.00    Types: Cigarettes    Start date: 10/15/1982  .  Smokeless tobacco: Never Used  . Tobacco comment: 3 cigaretts a day  Substance Use Topics  . Alcohol use: No    Alcohol/week: 0.0 standard drinks  . Drug use: No    Home Medications Prior to Admission medications   Medication Sig Start Date End Date Taking? Authorizing Provider  amLODipine (NORVASC) 10 MG tablet Take 10 mg by mouth daily.    [provider]  aspirin 81 MG tablet Take 81 mg by mouth daily.    [provider]  atorvastatin (LIPITOR) 40 MG tablet Take 40 mg by mouth daily.    [provider]  carvedilol (COREG) 25 MG tablet Take 2 tablets (50 mg total) by mouth 2 (two) times daily. 03/31/13 06/24/17  Yehuda Savannah, MD    doxycycline (VIBRAMYCIN) 100 MG capsule Take 1 capsule (100 mg total) by mouth 2 (two) times daily for 7 days. 02/02/20 02/09/20  Couture, Cortni S, PA-C  furosemide (LASIX) 40 MG tablet TAKE 1 TABLET (40 MG TOTAL) BY MOUTH EVERY MORNING. 08/02/13   Arnoldo Lenis, MD  isosorbide mononitrate (IMDUR) 120 MG 24 hr tablet TAKE 1 TABLET BY MOUTH EVERY DAY 01/18/14   Lendon Colonel, NP  lisinopril (PRINIVIL,ZESTRIL) 40 MG tablet TAKE 1 TABLET BY MOUTH EVERY DAY 01/18/14   Lendon Colonel, NP  metFORMIN (GLUCOPHAGE) 500 MG tablet Take by mouth 2 (two) times daily with a meal.    [provider]  methocarbamol (ROBAXIN) 500 MG tablet Take 1 tablet (500 mg total) by mouth every 8 (eight) hours as needed for muscle spasms. 11/25/16   Davonna Belling, MD  oxyCODONE-acetaminophen (PERCOCET) 7.5-325 MG tablet Take 1-2 tablets by mouth every 4 (four) hours as needed. 06/24/17   Aviva Signs, MD  pantoprazole (PROTONIX) 40 MG tablet TAKE 1 TABLET (40 MG TOTAL) BY MOUTH DAILY. 03/22/13   Lendon Colonel, NP  pravastatin (PRAVACHOL) 40 MG tablet Take 1 tablet (40 mg total) by mouth every evening. 10/31/12   Yehuda Savannah, MD  spironolactone (ALDACTONE) 25 MG tablet TAKE 1 TABLET (25 MG TOTAL) BY MOUTH DAILY. 11/14/15   Herminio Commons, MD    Allergies    Patient has no known allergies.  Review of Systems   Review of Systems  Constitutional: Negative.   HENT: Negative.   Respiratory: Negative.   Cardiovascular: Negative.   Gastrointestinal: Negative.   Genitourinary: Negative.   Musculoskeletal: Negative.   Skin: Positive for wound.  Neurological: Negative.   All other systems reviewed and are negative.   Physical Exam Updated Vital Signs BP 134/78 (BP Location: Right Arm)   Pulse 72   Temp 98.1 F (36.7 C) (Oral)   Resp 16   Ht 6\' 6"  (1.981 m)   Wt 122.5 kg   SpO2 99%   BMI 31.20 kg/m   Physical Exam Vitals and nursing note reviewed. Exam conducted with a  chaperone present.  Constitutional:      General: He is not in acute distress.    Appearance: He is well-developed. He is not ill-appearing, toxic-appearing or diaphoretic.  HENT:     Head: Atraumatic.     Nose: Nose normal.     Mouth/Throat:     Mouth: Mucous membranes are moist.  Eyes:     Pupils: Pupils are equal, round, and reactive to light.  Cardiovascular:     Rate and Rhythm: Normal rate and regular rhythm.  Pulmonary:     Effort: Pulmonary effort is normal. No respiratory distress.  Breath sounds: Normal breath sounds.  Abdominal:     General: Bowel sounds are normal. There is no distension.     Palpations: Abdomen is soft.  Musculoskeletal:        General: Normal range of motion.     Cervical back: Normal range of motion and neck supple.  Skin:    General: Skin is warm and dry.     Capillary Refill: Capillary refill takes less than 2 seconds.     Comments: 1 cm total induration surrounding incision from prior abscess drainage however no fluctuance, bloody or purulent discharge.  No induration extending into rectum, perineal or testicle region.  No surrounding erythema or warmth.  Neurological:     Mental Status: He is alert.     ED Results / Procedures / Treatments   Labs (all labs ordered are listed, but only abnormal results are displayed) Labs Reviewed - No data to display  EKG None  Radiology No results found.  Procedures Procedures (including critical care time)  Medications Ordered in ED Medications - No data to display  ED Course  I have reviewed the triage vital signs and the nursing notes.  Pertinent labs & imaging results that were available during my care of the patient were reviewed by me and considered in my medical decision making (see chart for details).  59 year old male appears otherwise well presents for evaluation of wound recheck.  He is afebrile, nonseptic, non-ill-appearing.  Wound appears to be well-healing.  Packing is not  currently present on exam and appears to have fallen out.  Patient without any systemic symptoms.  He does have approximately 1 cm total induration around incision site were abscess was drained previously however no bloody or purulent drainage.  No surrounding erythema or warmth.  Patient appears otherwise well.  Discussed continued warm soaks and completion of antibiotics.  The patient has been appropriately medically screened and/or stabilized in the ED. I have low suspicion for any other emergent medical condition which would require further screening, evaluation or treatment in the ED or require inpatient management.  Patient is hemodynamically stable and in no acute distress.  Patient able to ambulate in department prior to ED.  Evaluation does not show acute pathology that would require ongoing or additional emergent interventions while in the emergency department or further inpatient treatment.  I have discussed the diagnosis with the patient and answered all questions.  Pain is been managed while in the emergency department and patient has no further complaints prior to discharge.  Patient is comfortable with plan discussed in room and is stable for discharge at this time.  I have discussed strict return precautions for returning to the emergency department.  Patient was encouraged to follow-up with PCP/specialist refer to at discharge.    MDM Rules/Calculators/A&P                       Final Clinical Impression(s) / ED Diagnoses Final diagnoses:  Visit for wound check    Rx / DC Orders ED Discharge Orders    None       Nayellie Sanseverino A, PA-C 02/04/20 1237    LongWonda Olds, MD 02/05/20 [phone]

## 2020-02-08 DIAGNOSIS — M7918 Myalgia, other site: Secondary | ICD-10-CM | POA: Diagnosis not present

## 2020-02-22 DIAGNOSIS — B351 Tinea unguium: Secondary | ICD-10-CM | POA: Diagnosis not present

## 2020-02-22 DIAGNOSIS — B353 Tinea pedis: Secondary | ICD-10-CM | POA: Diagnosis not present

## 2020-02-22 DIAGNOSIS — L851 Acquired keratosis [keratoderma] palmaris et plantaris: Secondary | ICD-10-CM | POA: Diagnosis not present

## 2020-02-22 DIAGNOSIS — E1140 Type 2 diabetes mellitus with diabetic neuropathy, unspecified: Secondary | ICD-10-CM | POA: Diagnosis not present

## 2020-04-12 DIAGNOSIS — G4733 Obstructive sleep apnea (adult) (pediatric): Secondary | ICD-10-CM | POA: Diagnosis not present

## 2020-04-12 DIAGNOSIS — I10 Essential (primary) hypertension: Secondary | ICD-10-CM | POA: Diagnosis not present

## 2020-04-12 DIAGNOSIS — I2510 Atherosclerotic heart disease of native coronary artery without angina pectoris: Secondary | ICD-10-CM | POA: Diagnosis not present

## 2020-04-12 DIAGNOSIS — E119 Type 2 diabetes mellitus without complications: Secondary | ICD-10-CM | POA: Diagnosis not present

## 2020-04-21 DIAGNOSIS — Z79899 Other long term (current) drug therapy: Secondary | ICD-10-CM | POA: Diagnosis not present

## 2020-04-21 DIAGNOSIS — Z0001 Encounter for general adult medical examination with abnormal findings: Secondary | ICD-10-CM | POA: Diagnosis not present

## 2020-04-21 DIAGNOSIS — I10 Essential (primary) hypertension: Secondary | ICD-10-CM | POA: Diagnosis not present

## 2020-04-21 DIAGNOSIS — E119 Type 2 diabetes mellitus without complications: Secondary | ICD-10-CM | POA: Diagnosis not present

## 2020-05-02 DIAGNOSIS — B353 Tinea pedis: Secondary | ICD-10-CM | POA: Diagnosis not present

## 2020-05-02 DIAGNOSIS — B351 Tinea unguium: Secondary | ICD-10-CM | POA: Diagnosis not present

## 2020-05-02 DIAGNOSIS — L851 Acquired keratosis [keratoderma] palmaris et plantaris: Secondary | ICD-10-CM | POA: Diagnosis not present

## 2020-05-02 DIAGNOSIS — E1140 Type 2 diabetes mellitus with diabetic neuropathy, unspecified: Secondary | ICD-10-CM | POA: Diagnosis not present

## 2020-05-13 ENCOUNTER — Other Ambulatory Visit: Payer: Self-pay

## 2020-05-13 ENCOUNTER — Encounter (HOSPITAL_COMMUNITY): Payer: Self-pay | Admitting: *Deleted

## 2020-05-13 ENCOUNTER — Emergency Department (HOSPITAL_COMMUNITY)
Admission: EM | Admit: 2020-05-13 | Discharge: 2020-05-13 | Disposition: A | Discharge status: Home / Self Care | Payer: Worker's Compensation | Attending: Emergency Medicine | Admitting: Emergency Medicine

## 2020-05-13 DIAGNOSIS — Z77098 Contact with and (suspected) exposure to other hazardous, chiefly nonmedicinal, chemicals: Secondary | ICD-10-CM | POA: Diagnosis not present

## 2020-05-13 DIAGNOSIS — Z7982 Long term (current) use of aspirin: Secondary | ICD-10-CM | POA: Diagnosis not present

## 2020-05-13 DIAGNOSIS — F17210 Nicotine dependence, cigarettes, uncomplicated: Secondary | ICD-10-CM | POA: Diagnosis not present

## 2020-05-13 DIAGNOSIS — E119 Type 2 diabetes mellitus without complications: Secondary | ICD-10-CM | POA: Insufficient documentation

## 2020-05-13 DIAGNOSIS — I509 Heart failure, unspecified: Secondary | ICD-10-CM | POA: Insufficient documentation

## 2020-05-13 DIAGNOSIS — Z79899 Other long term (current) drug therapy: Secondary | ICD-10-CM | POA: Insufficient documentation

## 2020-05-13 DIAGNOSIS — T6594XA Toxic effect of unspecified substance, undetermined, initial encounter: Secondary | ICD-10-CM | POA: Insufficient documentation

## 2020-05-13 DIAGNOSIS — Z7984 Long term (current) use of oral hypoglycemic drugs: Secondary | ICD-10-CM | POA: Insufficient documentation

## 2020-05-13 DIAGNOSIS — I110 Hypertensive heart disease with heart failure: Secondary | ICD-10-CM | POA: Diagnosis not present

## 2020-05-13 MED ORDER — TETRACAINE HCL 0.5 % OP SOLN
2.0000 [drp] | Freq: Once | OPHTHALMIC | Status: AC
  Administered 2020-05-13: 2 [drp] via OPHTHALMIC
  Filled 2020-05-13: qty 4

## 2020-05-13 MED ORDER — FLUORESCEIN SODIUM 1 MG OP STRP
1.0000 | ORAL_STRIP | Freq: Once | OPHTHALMIC | Status: AC
  Administered 2020-05-13: 1 via OPHTHALMIC
  Filled 2020-05-13: qty 1

## 2020-05-13 NOTE — ED Provider Notes (Signed)
[facility] EMERGENCY DEPARTMENT Provider Note   CSN: [id number] Arrival date & time: 05/13/20  1844     History Chief Complaint  Patient presents with  . Eye Problem    James [name] is a 59 y.o. male.  HPI   Patient presents to the ED for evaluation of chemical foreign body in his eye.  Patient was at work when someone was playing a disinfectant.  They were not aware that he was behind a piece of equipment.  Patient ended up getting some sprayed in his eye.  Patient denies any blurred vision.  He did irrigate his eye at work.  Complains of some mild irritation.  Past Medical History:  Diagnosis Date  . Arteriosclerotic cardiovascular disease (ASCVD)    Insignificant disease in 2002 and 2013  . Cardiomyopathy ([place])    CHF and EF of 25% in 02/2012; normal TSH  . Diabetes mellitus without complication ([place])   . Hypercholesteremia   . Hypertension   . Myocardial infarction ([place])    2000 MI  . Obstructive sleep apnea    was positive but never given a CPAP  . Tobacco abuse     Patient Active Problem List   Diagnosis Date Noted  . Non-recurrent unilateral inguinal hernia without obstruction or gangrene   . Hyperlipidemia 11/04/2012  . Aortic root dilatation ([place]) 08/05/2012  . Cholelithiasis   . Obstructive sleep apnea 04/28/2012  . Cardiomyopathy, nonischemic ([place]) 04/03/2012  . Hypertension 08/07/2010  . 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/07/2010    Past Surgical History:  Procedure Laterality Date  . CARDIAC CATHETERIZATION  2002  . COLONOSCOPY N/A 03/22/2014   Procedure: COLONOSCOPY;  Surgeon: Danie Binder, MD;  Location: [facility] ENDO SUITE;  Service: Endoscopy;  Laterality: N/A;  11:15 AM  . INGUINAL HERNIA REPAIR Right 06/24/2017   Procedure: HERNIA REPAIR INGUINAL ADULT WITH MESH;  Surgeon: Aviva Signs, MD;  Location: [facility] ORS;  Service: General;  Laterality: Right;       Family History  Problem Relation Age of Onset  . Heart attack Father     . Hypertension Father   . Iron deficiency Father   . Coronary artery disease Mother   . Heart disease Maternal Grandfather        Also paternal grandfather  . Cardiomyopathy Brother        No significant coronary disease by coronary angiography  . Thyroid disease Sister   . Sudden death Other   . Colon cancer Neg Hx     Social History   Tobacco Use  . Smoking status: Current Every Day Smoker    Packs/day: 0.50    Years: 32.00    Pack years: 16.00    Types: Cigarettes    Start date: 10/15/1982  . Smokeless tobacco: Never Used  . Tobacco comment: 3 cigaretts a day  Vaping Use  . Vaping Use: Never used  Substance Use Topics  . Alcohol use: No    Alcohol/week: 0.0 standard drinks  . Drug use: No    Home Medications Prior to Admission medications   Medication Sig Start Date End Date Taking? Authorizing Provider  amLODipine (NORVASC) 10 MG tablet Take 10 mg by mouth daily.    [provider]  aspirin 81 MG tablet Take 81 mg by mouth daily.    [provider]  atorvastatin (LIPITOR) 40 MG tablet Take 40 mg by mouth daily.    [provider]  carvedilol (COREG) 25 MG tablet Take 2 tablets (50  mg total) by mouth 2 (two) times daily. 03/31/13 06/24/17  Yehuda Savannah, MD  furosemide (LASIX) 40 MG tablet TAKE 1 TABLET (40 MG TOTAL) BY MOUTH EVERY MORNING. 08/02/13   Arnoldo Lenis, MD  isosorbide mononitrate (IMDUR) 120 MG 24 hr tablet TAKE 1 TABLET BY MOUTH EVERY DAY 01/18/14   Lendon Colonel, NP  lisinopril (PRINIVIL,ZESTRIL) 40 MG tablet TAKE 1 TABLET BY MOUTH EVERY DAY 01/18/14   Lendon Colonel, NP  metFORMIN (GLUCOPHAGE) 500 MG tablet Take by mouth 2 (two) times daily with a meal.    [provider]  methocarbamol (ROBAXIN) 500 MG tablet Take 1 tablet (500 mg total) by mouth every 8 (eight) hours as needed for muscle spasms. 11/25/16   Davonna Belling, MD  oxyCODONE-acetaminophen (PERCOCET) 7.5-325 MG tablet Take 1-2 tablets by  mouth every 4 (four) hours as needed. 06/24/17   Aviva Signs, MD  pantoprazole (PROTONIX) 40 MG tablet TAKE 1 TABLET (40 MG TOTAL) BY MOUTH DAILY. 03/22/13   Lendon Colonel, NP  pravastatin (PRAVACHOL) 40 MG tablet Take 1 tablet (40 mg total) by mouth every evening. 10/31/12   Yehuda Savannah, MD  spironolactone (ALDACTONE) 25 MG tablet TAKE 1 TABLET (25 MG TOTAL) BY MOUTH DAILY. 11/14/15   Herminio Commons, MD    Allergies    Patient has no known allergies.  Review of Systems   Review of Systems  All other systems reviewed and are negative.   Physical Exam Updated Vital Signs BP (!) 138/94   Pulse 80   Temp 98.3 F (36.8 C) (Oral)   Resp 19   Ht 1.981 m (6\' 6" )   Wt (!) 117.9 kg   SpO2 97%   BMI 30.05 kg/m   Physical Exam Vitals and nursing note reviewed.  Constitutional:      General: He is not in acute distress.    Appearance: He is well-developed.  HENT:     Head: Normocephalic and atraumatic.     Right Ear: External ear normal.     Left Ear: External ear normal.  Eyes:     General: No scleral icterus.       Right eye: No discharge.        Left eye: No discharge.     Conjunctiva/sclera: Conjunctivae normal.     Pupils: Pupils are equal, round, and reactive to light.     Comments: No irritation noted, no foreign body, fluorescein stain used and no evidence of fluorescein uptake.  Neck:     Trachea: No tracheal deviation.  Cardiovascular:     Rate and Rhythm: Normal rate.  Pulmonary:     Effort: Pulmonary effort is normal. No respiratory distress.     Breath sounds: No stridor.  Abdominal:     General: There is no distension.  Musculoskeletal:        General: No swelling or deformity.     Cervical back: Neck supple.  Skin:    General: Skin is warm and dry.     Findings: No rash.  Neurological:     Mental Status: He is alert.     Cranial Nerves: Cranial nerve deficit: no gross deficits.     ED Results / Procedures / Treatments   Labs (all  labs ordered are listed, but only abnormal results are displayed) Labs Reviewed - No data to display  EKG None  Radiology No results found.  Procedures Procedures (including critical care time)  Medications Ordered in ED Medications  fluorescein ophthalmic strip 1 strip (1 strip Right Eye Given 05/13/20 1949)  tetracaine (PONTOCAINE) 0.5 % ophthalmic solution 2 drop (2 drops Right Eye Given 05/13/20 1949)    ED Course  I have reviewed the triage vital signs and the nursing notes.  Pertinent labs & imaging results that were available during my care of the patient were reviewed by me and considered in my medical decision making (see chart for details).    MDM Rules/Calculators/A&P                          Poison control was contacted.  The chemical that the patient was exposed to is neutral.  They recommended eye irrigation.  This was done in the ED.  Fortunately no signs of serious injury.  Patient appears stable for discharge. Final Clinical Impression(s) / ED Diagnoses Final diagnoses:  Chemical exposure of eye    Rx / DC Orders ED Discharge Orders    None       Dorie Rank, MD 05/13/20 2027

## 2020-05-13 NOTE — Discharge Instructions (Addendum)
Your symptoms should resolve in the next day or so.  You could use artificial tears as needed.  Follow-up with if symptoms persist

## 2020-05-13 NOTE — ED Triage Notes (Signed)
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 Pt did flush eyes prior to arrival in er,

## 2020-05-13 NOTE — ED Notes (Signed)
Poison control [place], POC  [place], [employer], eye exam   Agent has a neutral Ph  If eye exam is normal, then is ok  If anything is seen, the pt will require a follow up in 48-72 hours  Acuity Exam :  OD 20/40  OS 20/25  OU 20/25  Without glasses

## 2020-05-13 NOTE — ED Notes (Signed)
Pt reports he was at work and while another co-worker cleaned a machine, some went into his eyes  He flushed his eyes prior to arrival   Now here for eval

## 2020-05-13 NOTE — ED Notes (Addendum)
Pt R eye irrigated with 150cc of NS over more or less 13 minutes  Pt reports eye feels better

## 2020-05-20 ENCOUNTER — Emergency Department (HOSPITAL_COMMUNITY): Payer: BC Managed Care – PPO

## 2020-05-20 ENCOUNTER — Ambulatory Visit
Admission: EM | Admit: 2020-05-20 | Discharge: 2020-05-20 | Disposition: A | Discharge status: Home / Self Care | Payer: BC Managed Care – PPO

## 2020-05-20 ENCOUNTER — Emergency Department (HOSPITAL_COMMUNITY)
Admission: EM | Admit: 2020-05-20 | Discharge: 2020-05-20 | Disposition: A | Discharge status: Home / Self Care | Payer: BC Managed Care – PPO | Attending: Emergency Medicine | Admitting: Emergency Medicine

## 2020-05-20 ENCOUNTER — Other Ambulatory Visit: Payer: Self-pay

## 2020-05-20 ENCOUNTER — Encounter (HOSPITAL_COMMUNITY): Payer: Self-pay | Admitting: Emergency Medicine

## 2020-05-20 DIAGNOSIS — M7989 Other specified soft tissue disorders: Secondary | ICD-10-CM | POA: Diagnosis not present

## 2020-05-20 DIAGNOSIS — Z7984 Long term (current) use of oral hypoglycemic drugs: Secondary | ICD-10-CM | POA: Insufficient documentation

## 2020-05-20 DIAGNOSIS — R600 Localized edema: Secondary | ICD-10-CM

## 2020-05-20 DIAGNOSIS — Z7982 Long term (current) use of aspirin: Secondary | ICD-10-CM | POA: Diagnosis not present

## 2020-05-20 DIAGNOSIS — R0789 Other chest pain: Secondary | ICD-10-CM

## 2020-05-20 DIAGNOSIS — M25572 Pain in left ankle and joints of left foot: Secondary | ICD-10-CM | POA: Diagnosis not present

## 2020-05-20 DIAGNOSIS — J40 Bronchitis, not specified as acute or chronic: Secondary | ICD-10-CM | POA: Diagnosis not present

## 2020-05-20 DIAGNOSIS — F17210 Nicotine dependence, cigarettes, uncomplicated: Secondary | ICD-10-CM | POA: Diagnosis not present

## 2020-05-20 DIAGNOSIS — I10 Essential (primary) hypertension: Secondary | ICD-10-CM | POA: Diagnosis not present

## 2020-05-20 DIAGNOSIS — E119 Type 2 diabetes mellitus without complications: Secondary | ICD-10-CM | POA: Diagnosis not present

## 2020-05-20 DIAGNOSIS — J069 Acute upper respiratory infection, unspecified: Secondary | ICD-10-CM | POA: Diagnosis not present

## 2020-05-20 DIAGNOSIS — Z20822 Contact with and (suspected) exposure to covid-19: Secondary | ICD-10-CM | POA: Diagnosis not present

## 2020-05-20 DIAGNOSIS — M25571 Pain in right ankle and joints of right foot: Secondary | ICD-10-CM | POA: Diagnosis not present

## 2020-05-20 DIAGNOSIS — R079 Chest pain, unspecified: Secondary | ICD-10-CM | POA: Diagnosis not present

## 2020-05-20 DIAGNOSIS — R0602 Shortness of breath: Secondary | ICD-10-CM | POA: Diagnosis not present

## 2020-05-20 DIAGNOSIS — Z79899 Other long term (current) drug therapy: Secondary | ICD-10-CM | POA: Insufficient documentation

## 2020-05-20 DIAGNOSIS — R05 Cough: Secondary | ICD-10-CM | POA: Diagnosis not present

## 2020-05-20 LAB — BASIC METABOLIC PANEL
Anion gap: 7 (ref 5–15)
BUN: 18 mg/dL (ref 6–20)
CO2: 27 mmol/L (ref 22–32)
Calcium: 9.1 mg/dL (ref 8.9–10.3)
Chloride: 105 mmol/L (ref 98–111)
Creatinine, Ser: 0.96 mg/dL (ref 0.61–1.24)
GFR calc Af Amer: >60 mL/min (ref >60)
GFR calc non Af Amer: >60 mL/min (ref >60)
Glucose, Bld: 101 mg/dL — ABNORMAL HIGH (ref 70–99)
Potassium: 4.2 mmol/L (ref 3.5–5.1)
Sodium: 139 mmol/L (ref 135–145)

## 2020-05-20 LAB — CBC
HCT: 47.4 % (ref 39.0–52.0)
Hemoglobin: 15.9 g/dL (ref 13.0–17.0)
MCH: 32.2 pg (ref 26.0–34.0)
MCHC: 33.5 g/dL (ref 30.0–36.0)
MCV: 96 fL (ref 80.0–100.0)
Platelets: 193 10*3/uL (ref 150–400)
RBC: 4.94 MIL/uL (ref 4.22–5.81)
RDW: 13.2 % (ref 11.5–15.5)
WBC: 9.4 10*3/uL (ref 4.0–10.5)
nRBC: 0 % (ref 0.0–0.2)

## 2020-05-20 LAB — TROPONIN I (HIGH SENSITIVITY)
Troponin I (High Sensitivity): 35 ng/L — ABNORMAL HIGH (ref <18)
Troponin I (High Sensitivity): 38 ng/L — ABNORMAL HIGH (ref <18)

## 2020-05-20 LAB — SARS CORONAVIRUS 2 BY RT PCR (HOSPITAL ORDER, PERFORMED IN ~~LOC~~ HOSPITAL LAB): SARS Coronavirus 2: NEGATIVE

## 2020-05-20 LAB — BRAIN NATRIURETIC PEPTIDE: B Natriuretic Peptide: 485 pg/mL — ABNORMAL HIGH (ref 0.0–100.0)

## 2020-05-20 MED ORDER — PREDNISONE 20 MG PO TABS
40.0000 mg | ORAL_TABLET | Freq: Once | ORAL | Status: AC
  Administered 2020-05-20: 40 mg via ORAL
  Filled 2020-05-20: qty 2

## 2020-05-20 MED ORDER — AZITHROMYCIN 250 MG PO TABS
250.0000 mg | ORAL_TABLET | Freq: Every day | ORAL | 0 refills | Status: DC
Start: 2020-05-20 — End: 2021-04-18

## 2020-05-20 MED ORDER — ALBUTEROL SULFATE HFA 108 (90 BASE) MCG/ACT IN AERS
4.0000 | INHALATION_SPRAY | Freq: Once | RESPIRATORY_TRACT | Status: AC
  Administered 2020-05-20: 4 via RESPIRATORY_TRACT
  Filled 2020-05-20: qty 6.7

## 2020-05-20 MED ORDER — BENZONATATE 100 MG PO CAPS
100.0000 mg | ORAL_CAPSULE | Freq: Three times a day (TID) | ORAL | 0 refills | Status: AC
Start: 2020-05-20 — End: 2020-05-25

## 2020-05-20 MED ORDER — IPRATROPIUM-ALBUTEROL 0.5-2.5 (3) MG/3ML IN SOLN
3.0000 mL | Freq: Once | RESPIRATORY_TRACT | Status: AC
  Administered 2020-05-20: 3 mL via RESPIRATORY_TRACT
  Filled 2020-05-20: qty 3

## 2020-05-20 MED ORDER — PREDNISONE 10 MG PO TABS
40.0000 mg | ORAL_TABLET | Freq: Every day | ORAL | 0 refills | Status: AC
Start: 2020-05-20 — End: 2020-05-24

## 2020-05-20 MED ORDER — AEROCHAMBER PLUS FLO-VU MISC
1.0000 | Freq: Once | Status: AC
  Administered 2020-05-20: 1
  Filled 2020-05-20: qty 1

## 2020-05-20 NOTE — ED Provider Notes (Signed)
[facility] EMERGENCY DEPARTMENT Provider Note   CSN: [id number] Arrival date & time: 05/20/20  1000     History Chief Complaint  Patient presents with  . Cough    James [name] is a 59 y.o. male history of CAD/MI, cardiomyopathy/CHF, hypertension, hypercholesterolemia, OSA, aortic root dilation.  Patient presents today for cough chest pain shortness of breath onset 1 week ago.  Patient reports he first developed a mild cough which has been gradually worsening over the past 7 days he describes cough is productive with yellow sputum.  That day he began having a burning pressure chest pain with his cough, over the last 3-4 days he has had worsening central chest buring pressure with coughing, pain is mild-moderate, pain does not radiate.  He reports that during coughing fits he becomes short of breath this improves with time.  Symptoms associated with nonbloody diarrhea and generalized body aches.  He reports he has been using his albuterol at home for shortness of breath and chest pain with temporary relief.  He reports he continues to smoke.  He has not had his Covid vaccine.  Denies fever/chills, headache, hemoptysis, abdominal pain, nausea/vomiting, dysuria/hematuria, fall/injury, numbness/weakness, tingling or any additional concerns.  HPI     Past Medical History:  Diagnosis Date  . Arteriosclerotic cardiovascular disease (ASCVD)    Insignificant disease in 2002 and 2013  . Cardiomyopathy ([place])    CHF and EF of 25% in 02/2012; normal TSH  . Diabetes mellitus without complication ([place])   . Hypercholesteremia   . Hypertension   . Myocardial infarction ([place])    2000 MI  . Obstructive sleep apnea    was positive but never given a CPAP  . Tobacco abuse     Patient Active Problem List   Diagnosis Date Noted  . Non-recurrent unilateral inguinal hernia without obstruction or gangrene   . Hyperlipidemia 11/04/2012  . Aortic root dilatation ([place]) 08/05/2012  .  Cholelithiasis   . Obstructive sleep apnea 04/28/2012  . Cardiomyopathy, nonischemic ([place]) 04/03/2012  . Hypertension 08/07/2010  . 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/07/2010    Past Surgical History:  Procedure Laterality Date  . CARDIAC CATHETERIZATION  2002  . COLONOSCOPY N/A 03/22/2014   Procedure: COLONOSCOPY;  Surgeon: Danie Binder, MD;  Location: [facility] ENDO SUITE;  Service: Endoscopy;  Laterality: N/A;  11:15 AM  . INGUINAL HERNIA REPAIR Right 06/24/2017   Procedure: HERNIA REPAIR INGUINAL ADULT WITH MESH;  Surgeon: Aviva Signs, MD;  Location: [facility] ORS;  Service: General;  Laterality: Right;       Family History  Problem Relation Age of Onset  . Heart attack Father   . Hypertension Father   . Iron deficiency Father   . Coronary artery disease Mother   . Heart disease Maternal Grandfather        Also paternal grandfather  . Cardiomyopathy Brother        No significant coronary disease by coronary angiography  . Thyroid disease Sister   . Sudden death Other   . Colon cancer Neg Hx     Social History   Tobacco Use  . Smoking status: Current Every Day Smoker    Packs/day: 0.50    Years: 32.00    Pack years: 16.00    Types: Cigarettes    Start date: 10/15/1982  . Smokeless tobacco: Never Used  . Tobacco comment: 3 cigaretts a day  Vaping Use  . Vaping Use: Never used  Substance Use Topics  . Alcohol  use: No    Alcohol/week: 0.0 standard drinks  . Drug use: No    Home Medications Prior to Admission medications   Medication Sig Start Date End Date Taking? Authorizing Provider  amLODipine (NORVASC) 10 MG tablet Take 10 mg by mouth daily.   Yes [provider]  aspirin 81 MG tablet Take 81 mg by mouth daily.   Yes [provider]  atorvastatin (LIPITOR) 40 MG tablet Take 40 mg by mouth daily.   Yes [provider]  carvedilol (COREG) 25 MG tablet Take 2 tablets (50 mg total) by mouth 2 (two) times daily. 03/31/13  Yes  Rothbart, Cristopher Estimable, MD  furosemide (LASIX) 40 MG tablet TAKE 1 TABLET (40 MG TOTAL) BY MOUTH EVERY MORNING. Patient taking differently: Take 40 mg by mouth daily.  08/02/13  Yes Arnoldo Lenis, MD  isosorbide mononitrate (IMDUR) 120 MG 24 hr tablet TAKE 1 TABLET BY MOUTH EVERY DAY 01/18/14  Yes Lendon Colonel, NP  lisinopril (PRINIVIL,ZESTRIL) 40 MG tablet TAKE 1 TABLET BY MOUTH EVERY DAY 01/18/14  Yes Lendon Colonel, NP  metFORMIN (GLUCOPHAGE) 500 MG tablet Take 500 mg by mouth 2 (two) times daily with a meal.    Yes [provider]  pantoprazole (PROTONIX) 40 MG tablet TAKE 1 TABLET (40 MG TOTAL) BY MOUTH DAILY. Patient taking differently: Take 40 mg by mouth daily.  03/22/13  Yes Lendon Colonel, NP  pravastatin (PRAVACHOL) 40 MG tablet Take 1 tablet (40 mg total) by mouth every evening. 10/31/12  Yes Rothbart, Cristopher Estimable, MD  spironolactone (ALDACTONE) 25 MG tablet TAKE 1 TABLET (25 MG TOTAL) BY MOUTH DAILY. Patient taking differently: Take 25 mg by mouth daily.  11/14/15  Yes Herminio Commons, MD  azithromycin (ZITHROMAX) 250 MG tablet Take 1 tablet (250 mg total) by mouth daily. Take first 2 tablets together, then 1 every day until finished. 05/20/20   Nuala Alpha A, PA-C  benzonatate (TESSALON) 100 MG capsule Take 1 capsule (100 mg total) by mouth every 8 (eight) hours for 5 days. 05/20/20 05/25/20  Nuala Alpha A, PA-C  predniSONE (DELTASONE) 10 MG tablet Take 4 tablets (40 mg total) by mouth daily for 4 days. 05/20/20 05/24/20  Nuala Alpha A, PA-C    Allergies    Patient has no known allergies.  Review of Systems   Review of Systems Ten systems are reviewed and are negative for acute change except as noted in the HPI  Physical Exam Updated Vital Signs BP 136/86   Pulse 62   Temp 98.1 F (36.7 C) (Oral)   Resp 18   Ht 6\' 6"  (1.981 m)   Wt 117.9 kg   SpO2 98%   BMI 30.05 kg/m   Physical Exam Constitutional:      General: He is not in acute  distress.    Appearance: Normal appearance. He is well-developed. He is not ill-appearing or diaphoretic.  HENT:     Head: Normocephalic and atraumatic.  Eyes:     General: Vision grossly intact. Gaze aligned appropriately.     Pupils: Pupils are equal, round, and reactive to light.  Neck:     Trachea: Trachea and phonation normal.  Cardiovascular:     Rate and Rhythm: Normal rate and regular rhythm.  Pulmonary:     Effort: Pulmonary effort is normal. No respiratory distress.     Breath sounds: Normal breath sounds.  Abdominal:     General: There is no distension.  Palpations: Abdomen is soft.     Tenderness: There is no abdominal tenderness. There is no guarding or rebound.  Musculoskeletal:        General: Normal range of motion.     Cervical back: Normal range of motion.     Right lower leg: No edema.     Left lower leg: No edema.  Skin:    General: Skin is warm and dry.  Neurological:     Mental Status: He is alert.     GCS: GCS eye subscore is 4. GCS verbal subscore is 5. GCS motor subscore is 6.     Comments: Speech is clear and goal oriented, follows commands Major Cranial nerves without deficit, no facial droop Moves extremities without ataxia, coordination intact  Psychiatric:        Behavior: Behavior normal.     ED Results / Procedures / Treatments   Labs (all labs ordered are listed, but only abnormal results are displayed) Labs Reviewed  BASIC METABOLIC PANEL - Abnormal; Notable for the following components:      Result Value   Glucose, Bld 101 (*)    All other components within normal limits  BRAIN NATRIURETIC PEPTIDE - Abnormal; Notable for the following components:   B Natriuretic Peptide 485.0 (*)    All other components within normal limits  TROPONIN I (HIGH SENSITIVITY) - Abnormal; Notable for the following components:   Troponin I (High Sensitivity) 35 (*)    All other components within normal limits  TROPONIN I (HIGH SENSITIVITY) - Abnormal;  Notable for the following components:   Troponin I (High Sensitivity) 38 (*)    All other components within normal limits  SARS CORONAVIRUS 2 BY RT PCR [facility] ORDER, [place] LAB)  CBC    EKG EKG Interpretation  Date/Time:  Friday May 20 2020 11:05:49 EDT Ventricular Rate:  70 PR Interval:  144 QRS Duration: 84 QT Interval:  404 QTC Calculation: 436 R Axis:   -10 Text Interpretation: Sinus rhythm with occasional Premature ventricular complexes Anteroseptal infarct , age undetermined T wave abnormality, consider inferolateral ischemia Abnormal ECG Similar to prior. No STEMI Confirmed by Nanda Quinton [phone]) on 05/20/2020 12:32:10 PM   Radiology DG Chest 2 View  Result Date: 05/20/2020 CLINICAL DATA:  Chest pain EXAM: CHEST - 2 VIEW COMPARISON:  November 25, 2016 FINDINGS: The cardiomediastinal silhouette is unchanged in contour.Tortuous thoracic aorta. No pleural effusion. No pneumothorax. No focal consolidation. There is increased peribronchial cuffing. Visualized abdomen is unremarkable. Multilevel degenerative changes of the thoracic spine. IMPRESSION: Increased peribronchial cuffing may reflect underlying bronchitis or other small airways disease. No focal consolidation. Electronically Signed   By: Valentino Saxon MD   On: 05/20/2020 11:32   US Venous Img Lower Bilateral (DVT)  Result Date: 05/20/2020 CLINICAL DATA:  Bilateral ankle pain and right calf swelling EXAM: Bilateral LOWER EXTREMITY VENOUS DOPPLER ULTRASOUND TECHNIQUE: Gray-scale sonography with compression, as well as color and duplex ultrasound, were performed to evaluate the deep venous system(s) from the level of the common femoral vein through the popliteal and proximal calf veins. COMPARISON:  None. FINDINGS: VENOUS Normal compressibility of the common femoral, superficial femoral, and popliteal veins, as well as the visualized calf veins. Visualized portions of profunda femoral vein and  great saphenous vein unremarkable. No filling defects to suggest DVT on grayscale or color Doppler imaging. Doppler waveforms show normal direction of venous flow, normal respiratory plasticity and response to augmentation. Limited views of the contralateral  common femoral vein are unremarkable. OTHER None. Limitations: none IMPRESSION: Negative. Electronically Signed   By: Donavan Foil M.D.   On: 05/20/2020 16:07    Procedures Procedures (including critical care time)  Medications Ordered in ED Medications  predniSONE (DELTASONE) tablet 40 mg (has no administration in time range)  albuterol (VENTOLIN HFA) 108 (90 Base) MCG/ACT inhaler 4 puff (4 puffs Inhalation Given 05/20/20 1431)  aerochamber plus with mask device 1 each (1 each Other Given 05/20/20 1432)  ipratropium-albuterol (DUONEB) 0.5-2.5 (3) MG/3ML nebulizer solution 3 mL (3 mLs Nebulization Given 05/20/20 1800)    ED Course  I have reviewed the triage vital signs and the nursing notes.  Pertinent labs & imaging results that were available during my care of the patient were reviewed by me and considered in my medical decision making (see chart for details).  Clinical Course as of May 20 1905  Fri May 20, 2020  1618 If delta troponin is flat, discharge with Azithro/COPD exacerbation treatment.   [BM]    Clinical Course User Index [BM] Gari Crown   MDM Rules/Calculators/A&P                          Additional history obtained from: 1. Nursing notes from this visit. 2. Electronic medical records. 3. Family at bedside. ----------------------------- I ordered, reviewed and interpreted labs which include: Initial high-sensitivity troponin of 35, delta of 38.  Flat no indication for third given 1 week of symptoms.  Elevation likely secondary to history of cardiomyopathy. BNP 485, 4 years ago BNP was 425.  During that visit patient had diagnosis of bronchitis. CBC within normal limits, no leukocytosis or anemia. BMP  shows no emergent electrolyte derangement, AKI or gap. Covid test negative.  EKG: Sinus rhythm with occasional Premature ventricular complexes Anteroseptal infarct , age undetermined T wave abnormality, consider inferolateral ischemia Abnormal ECG Similar to prior. No STEMI Confirmed by Nanda Quinton [id number]) on 05/20/2020 12:32:10 PM  CXR:  IMPRESSION:  Increased peribronchial cuffing may reflect underlying bronchitis or  other small airways disease. No focal consolidation.   Bilateral Lower Extremity DVT US:  IMPRESSION:  Negative.  --------------------------------- 59 year old male presented with 1 week of burning chest pressure particularly when he coughs.  On examination he is well-appearing no acute distress.  Vital signs within normal limits without hypoxia, hypotension, tachypnea or tachycardia.  Cranial nerves intact, no meningeal signs.  Heart regular rate and rhythm.  Lungs with wheezing diffusely.  Abdomen soft nontender without pulsatile masses.  Neurovascular intact to bilateral lower extremities without evidence of DVT.  Chest pain work-up was initiated in triage overall reassuring.  Flat troponins without indication for a third.  Patient mentioned he felt his right foot was swollen compared to his left which prompted the lower extremity ultrasounds.  On examination there is no discernible difference between the 2 extremities.  No evidence on exam of cellulitis, DVT, compartment syndrome, septic arthritis or neurovascular compromise.  DVT studies were ordered for evaluation and were negative.  Given reassuring ultrasound and vital signs and examination consistent with reactive airway disease doubt pulmonary embolism as etiology of symptoms today.  Additionally history and presentation is inconsistent with ACS, dissection, pericarditis or other emergent cardiopulmonary etiologies.  Given patient is a current smoker we will treat as acute exacerbation of chronic bronchitis.  Patient started on  azithromycin and prednisone.  Patient states understanding that prednisone will raise his blood sugar levels and he plans  to monitor them closely at home.  Additionally patient given refill of albuterol inhaler in the ER today.  He was given a nebulizer as well and reports that his chest pain/shortness of breath resolved and he is breathing at baseline.  Auscultation shows improved lung sounds.  Additionally will prescribe Tessalon to help with cough.  Patient encouraged to follow-up with his primary care doctor and cardiologist.  At this time there does not appear to be any evidence of an acute emergency medical condition and the patient appears stable for discharge with appropriate outpatient follow up. Diagnosis was discussed with patient who verbalizes understanding of care plan and is agreeable to discharge. I have discussed return precautions with patient who verbalizes understanding. Patient encouraged to follow-up with their PCP. All questions answered.  Patient's case discussed with Dr. Rogene Houston who agrees with plan to discharge with outpatient follow-up.   Note: Portions of this report may have been transcribed using voice recognition software. Every effort was made to ensure accuracy; however, inadvertent computerized transcription errors may still be present. Final Clinical Impression(s) / ED Diagnoses Final diagnoses:  Viral URI with cough  Bronchitis  Atypical chest pain    Rx / DC Orders ED Discharge Orders         Ordered    predniSONE (DELTASONE) 10 MG tablet  Daily     Discontinue  Reprint     05/20/20 1852    azithromycin (ZITHROMAX) 250 MG tablet  Daily     Discontinue  Reprint     05/20/20 1852    benzonatate (TESSALON) 100 MG capsule  Every 8 hours     Discontinue  Reprint     05/20/20 1852           Gari Crown 05/20/20 Jillyn Ledger, MD 05/24/20 [phone]

## 2020-05-20 NOTE — Discharge Instructions (Signed)
At this time there does not appear to be the presence of an emergent medical condition, however there is always the potential for conditions to change. Please read and follow the below instructions.  Please return to the Emergency Department immediately for any new or worsening symptoms or if your symptoms do not improve within 3 days. Please be sure to follow up with your Primary Care Provider within one week regarding your visit today; please call their office to schedule an appointment even if you are feeling better for a follow-up visit. Additionally schedule follow-up appointments with your cardiology team. Please take your antibiotic Azithromycin as prescribed until complete to help with your symptoms.  Please drink enough water to avoid dehydration and get plenty of rest. Take the steroid medication prednisone as prescribed. Please monitor your blood sugar levels closely while taking prednisone as it will make your blood sugar levels rise. You are given your first dose of prednisone in the ER today. Please begin taking your medication tomorrow morning, 40 mg, for the next 4 days. Continue using your albuterol inhaler for wheezing. If you find this medication is not helping return to the ER. You may use the medication Tessalon as prescribed to help with your cough.  Get help right away if: Your chest pain is worse. You have a cough that gets worse, or you cough up blood. You have very bad (severe) pain in your belly (abdomen). You pass out (faint). You have either of these for no clear reason: Sudden chest discomfort. Sudden discomfort in your arms, back, neck, or jaw. You have shortness of breath at any time. You suddenly start to sweat, or your skin gets clammy. You feel sick to your stomach (nauseous). You throw up (vomit). You suddenly feel lightheaded or dizzy. You feel very weak or tired. Your heart starts to beat fast, or it feels like it is skipping beats. You have any  new/concerning or worsening of symptoms These symptoms may be an emergency. Do not wait to see if the symptoms will go away. Get medical help right away. Call your local emergency services (911 in the [place] Do not drive yourself to the hospital.  Please read the additional information packets attached to your discharge summary.  Do not take your medicine if  develop an itchy rash, swelling in your mouth or lips, or difficulty breathing; call 911 and seek immediate emergency medical attention if this occurs.  You may review your lab tests and imaging results in their entirety on your MyChart account.  Please discuss all results of fully with your primary care provider and other specialist at your follow-up visit.  Note: Portions of this text may have been transcribed using voice recognition software. Every effort was made to ensure accuracy; however, inadvertent computerized transcription errors may still be present.

## 2020-05-20 NOTE — ED Triage Notes (Addendum)
Cough, chest pain in the center of chest when coughing and shortness of breath since last Friday.

## 2020-05-20 NOTE — ED Triage Notes (Signed)
Patient is being discharged from the Urgent Care and sent to the Emergency Department via private vehicle . Per [place], patient is in need of higher level of care due to needing cardiac workup for cc/o chest pain and sob . Patient is aware and verbalizes understanding of plan of care. Patient is stable and feels comfortable driving  Vitals:   37/54/36 0936  BP: (!) 151/81  Pulse: 71  Resp: 20  Temp: 98.6 F (37 C)  SpO2: 95%

## 2020-05-20 NOTE — ED Triage Notes (Signed)
Pt presents with c/o cough and some sob, describes chest pain with cough , positive covid exposure at work

## 2020-07-25 DIAGNOSIS — E1140 Type 2 diabetes mellitus with diabetic neuropathy, unspecified: Secondary | ICD-10-CM | POA: Diagnosis not present

## 2020-07-25 DIAGNOSIS — B351 Tinea unguium: Secondary | ICD-10-CM | POA: Diagnosis not present

## 2020-07-25 DIAGNOSIS — B353 Tinea pedis: Secondary | ICD-10-CM | POA: Diagnosis not present

## 2020-07-25 DIAGNOSIS — L851 Acquired keratosis [keratoderma] palmaris et plantaris: Secondary | ICD-10-CM | POA: Diagnosis not present

## 2020-08-19 DIAGNOSIS — E119 Type 2 diabetes mellitus without complications: Secondary | ICD-10-CM | POA: Diagnosis not present

## 2020-08-19 DIAGNOSIS — J42 Unspecified chronic bronchitis: Secondary | ICD-10-CM | POA: Diagnosis not present

## 2020-08-19 DIAGNOSIS — F17200 Nicotine dependence, unspecified, uncomplicated: Secondary | ICD-10-CM | POA: Diagnosis not present

## 2020-08-19 DIAGNOSIS — I10 Essential (primary) hypertension: Secondary | ICD-10-CM | POA: Diagnosis not present

## 2020-10-17 DIAGNOSIS — B353 Tinea pedis: Secondary | ICD-10-CM | POA: Diagnosis not present

## 2020-10-17 DIAGNOSIS — B351 Tinea unguium: Secondary | ICD-10-CM | POA: Diagnosis not present

## 2020-10-17 DIAGNOSIS — L851 Acquired keratosis [keratoderma] palmaris et plantaris: Secondary | ICD-10-CM | POA: Diagnosis not present

## 2020-10-17 DIAGNOSIS — E1140 Type 2 diabetes mellitus with diabetic neuropathy, unspecified: Secondary | ICD-10-CM | POA: Diagnosis not present

## 2020-10-27 ENCOUNTER — Encounter: Payer: Self-pay | Admitting: Emergency Medicine

## 2020-10-27 ENCOUNTER — Ambulatory Visit
Admission: EM | Admit: 2020-10-27 | Discharge: 2020-10-27 | Disposition: A | Discharge status: Home / Self Care | Payer: BC Managed Care – PPO | Attending: Family Medicine | Admitting: Family Medicine

## 2020-10-27 ENCOUNTER — Other Ambulatory Visit: Payer: Self-pay

## 2020-10-27 DIAGNOSIS — R059 Cough, unspecified: Secondary | ICD-10-CM

## 2020-10-27 DIAGNOSIS — B349 Viral infection, unspecified: Secondary | ICD-10-CM

## 2020-10-27 DIAGNOSIS — R6889 Other general symptoms and signs: Secondary | ICD-10-CM | POA: Diagnosis not present

## 2020-10-27 DIAGNOSIS — R062 Wheezing: Secondary | ICD-10-CM

## 2020-10-27 DIAGNOSIS — J3489 Other specified disorders of nose and nasal sinuses: Secondary | ICD-10-CM

## 2020-10-27 DIAGNOSIS — R0602 Shortness of breath: Secondary | ICD-10-CM

## 2020-10-27 DIAGNOSIS — R067 Sneezing: Secondary | ICD-10-CM | POA: Diagnosis not present

## 2020-10-27 MED ORDER — PROMETHAZINE-DM 6.25-15 MG/5ML PO SYRP: 5.0000 mL | ORAL_SOLUTION | Freq: Four times a day (QID) | ORAL | 0 refills | Status: DC | PRN

## 2020-10-27 MED ORDER — PREDNISONE 10 MG (21) PO TBPK: ORAL_TABLET | Freq: Every day | ORAL | 0 refills | Status: AC

## 2020-10-27 NOTE — ED Triage Notes (Signed)
Cough, sneezing, congestion, and slight headache x 2 days, abd area feels sore.  Feels SOB

## 2020-10-27 NOTE — Discharge Instructions (Signed)
I have sent in a prednisone taper for you to take for 6 days. 6 tablets on day one, 5 tablets on day two, 4 tablets on day three, 3 tablets on day four, 2 tablets on day five, and 1 tablet on day six.  I have sent in cough syrup for you to take. This medication can make you sleepy. Do not drive while taking this medication.  Your COVID test is pending.  You should self quarantine until the test result is back.    Take Tylenol or ibuprofen as needed for fever or discomfort.  Rest and keep yourself hydrated.    Follow-up with your primary care provider if your symptoms are not improving.

## 2020-10-27 NOTE — ED Provider Notes (Signed)
[facility]   [id number] 10/27/20 Arrival Time: 1520   CC: COVID symptoms  SUBJECTIVE: History from: patient.  James [name] is a 60 y.o. male who presents with cough, sneezing, nasal congestion, headache, shortness of breath for the last 2 days. Denies sick exposure to COVID, flu or strep. Denies recent travel. Has negative history of Covid. Has completed Covid vaccines.  Has not had booster COVID-vaccine.  Has not had flu vaccine.  Has not taken OTC medications for this.  Cough and shortness of breath are worsened with activity. Denies previous symptoms in the past. Denies fever, chills, fatigue, sinus pain, sore throat, chest pain, nausea, changes in bowel or bladder habits.    ROS: As per HPI.  All other pertinent ROS negative.     Past Medical History:  Diagnosis Date  . Arteriosclerotic cardiovascular disease (ASCVD)    Insignificant disease in 2002 and 2013  . Cardiomyopathy ([place])    CHF and EF of 25% in 02/2012; normal TSH  . Diabetes mellitus without complication ([place])   . Hypercholesteremia   . Hypertension   . Myocardial infarction ([place])    2000 MI  . Obstructive sleep apnea    was positive but never given a CPAP  . Tobacco abuse    Past Surgical History:  Procedure Laterality Date  . CARDIAC CATHETERIZATION  2002  . COLONOSCOPY N/A 03/22/2014   Procedure: COLONOSCOPY;  Surgeon: Danie Binder, MD;  Location: [facility];  Service: Endoscopy;  Laterality: N/A;  11:15 AM  . INGUINAL HERNIA REPAIR Right 06/24/2017   Procedure: HERNIA REPAIR INGUINAL ADULT WITH MESH;  Surgeon: Aviva Signs, MD;  Location: [facility];  Service: General;  Laterality: Right;   No Known Allergies No current facility-administered medications on file prior to encounter.   Current Outpatient Medications on File Prior to Encounter  Medication Sig Dispense Refill  . amLODipine (NORVASC) 10 MG tablet Take 10 mg by mouth daily.    Marland Kitchen aspirin 81 MG tablet Take 81 mg by mouth daily.     Marland Kitchen atorvastatin (LIPITOR) 40 MG tablet Take 40 mg by mouth daily.    Marland Kitchen azithromycin (ZITHROMAX) 250 MG tablet Take 1 tablet (250 mg total) by mouth daily. Take first 2 tablets together, then 1 every day until finished. 6 tablet 0  . carvedilol (COREG) 25 MG tablet Take 2 tablets (50 mg total) by mouth 2 (two) times daily. 120 tablet 3  . furosemide (LASIX) 40 MG tablet TAKE 1 TABLET (40 MG TOTAL) BY MOUTH EVERY MORNING. (Patient taking differently: Take 40 mg by mouth daily. ) 30 tablet 6  . isosorbide mononitrate (IMDUR) 120 MG 24 hr tablet TAKE 1 TABLET BY MOUTH EVERY DAY 30 tablet 1  . lisinopril (PRINIVIL,ZESTRIL) 40 MG tablet TAKE 1 TABLET BY MOUTH EVERY DAY 30 tablet 1  . metFORMIN (GLUCOPHAGE) 500 MG tablet Take 500 mg by mouth 2 (two) times daily with a meal.     . pantoprazole (PROTONIX) 40 MG tablet TAKE 1 TABLET (40 MG TOTAL) BY MOUTH DAILY. (Patient taking differently: Take 40 mg by mouth daily. ) 30 tablet 9  . pravastatin (PRAVACHOL) 40 MG tablet Take 1 tablet (40 mg total) by mouth every evening. 90 tablet 3  . spironolactone (ALDACTONE) 25 MG tablet TAKE 1 TABLET (25 MG TOTAL) BY MOUTH DAILY. (Patient taking differently: Take 25 mg by mouth daily. ) 30 tablet 1   Social History   Socioeconomic History  . Marital status: Married  Spouse name: Not on file  . Number of children: Not on file  . Years of education: Not on file  . Highest education level: Not on file  Occupational History  . Occupation: Full time, [employer]  Tobacco Use  . Smoking status: Current Every Day Smoker    Packs/day: 0.50    Years: 32.00    Pack years: 16.00    Types: Cigarettes    Start date: 10/15/1982  . Smokeless tobacco: Never Used  . Tobacco comment: 3 cigaretts a day  Vaping Use  . Vaping Use: Never used  Substance and Sexual Activity  . Alcohol use: No    Alcohol/week: 0.0 standard drinks  . Drug use: No  . Sexual activity: Yes    Birth control/protection: None  Other Topics  Concern  . Not on file  Social History Narrative   Divorced   No regular exercise   Social Determinants of Health   Financial Resource Strain: Not on file  Food Insecurity: Not on file  Transportation Needs: Not on file  Physical Activity: Not on file  Stress: Not on file  Social Connections: Not on file  Intimate Partner Violence: Not on file   Family History  Problem Relation Age of Onset  . Heart attack Father   . Hypertension Father   . Iron deficiency Father   . Coronary artery disease Mother   . Heart disease Maternal Grandfather        Also paternal grandfather  . Cardiomyopathy Brother        No significant coronary disease by coronary angiography  . Thyroid disease Sister   . Sudden death Other   . Colon cancer Neg Hx     OBJECTIVE:  Vitals:   10/27/20 1710  BP: (!) 143/77  Pulse: 84  Resp: 18  Temp: 98.3 F (36.8 C)  TempSrc: Oral  SpO2: 95%  Weight: 258 lb (117 kg)  Height: 6\' 6"  (1.981 m)     General appearance: alert; appears fatigued, but nontoxic; speaking in full sentences and tolerating own secretions HEENT: NCAT; Ears: EACs clear, TMs pearly gray; Eyes: PERRL.  EOM grossly intact. Sinuses: nontender; Nose: nares patent with clear rhinorrhea, Throat: oropharynx erythematous, cobblestoning present, tonsils non erythematous or enlarged, uvula midline  Neck: supple without LAD Lungs: unlabored respirations, symmetrical air entry; cough: absent; no respiratory distress; wheezing to right lower lobe Heart: regular rate and rhythm.  Radial pulses 2+ symmetrical bilaterally Skin: warm and dry Psychological: alert and cooperative; normal mood and affect  LABS:  No results found for this or any previous visit (from the past 24 hour(s)).   ASSESSMENT & PLAN:  1. Viral illness   2. Cough   3. SOB (shortness of breath)   4. Sneezing   5. Rhinorrhea   6. Wheezing     Meds ordered this encounter  Medications  . predniSONE (STERAPRED UNI-PAK 21  TAB) 10 MG (21) TBPK tablet    Sig: Take by mouth daily for 6 days. Take 6 tablets on day 1, 5 tablets on day 2, 4 tablets on day 3, 3 tablets on day 4, 2 tablets on day 5, 1 tablet on day 6    Dispense:  21 tablet    Refill:  0    Order Specific Question:   Supervising Provider    Answer:   Chase Picket [id number]  . promethazine-dextromethorphan (PROMETHAZINE-DM) 6.25-15 MG/5ML syrup    Sig: Take 5 mLs by mouth 4 (four) times daily  as needed for cough.    Dispense:  118 mL    Refill:  0    Order Specific Question:   Supervising Provider    Answer:   Chase Picket [id number]   Steroid taper prescribed Promethazine cough syrup prescribed Sedation precautions given Continue supportive care at home COVID and flu testing ordered.  It will take between 2-3 days for test results. Someone will contact you regarding abnormal results.   Work note provided Patient should remain in quarantine until they have received Covid results.  If negative you may resume normal activities (go back to work/school) while practicing hand hygiene, social distance, and mask wearing.  If positive, patient should remain in quarantine for at least 5 days from symptom onset AND greater than 72 hours after symptoms resolution (absence of fever without the use of fever-reducing medication and improvement in respiratory symptoms), whichever is longer Get plenty of rest and push fluids Use OTC zyrtec for nasal congestion, runny nose, and/or sore throat Use OTC flonase for nasal congestion and runny nose Use medications daily for symptom relief Use OTC medications like ibuprofen or tylenol as needed fever or pain Call or go to the ED if you have any new or worsening symptoms such as fever, worsening cough, shortness of breath, chest tightness, chest pain, turning blue, changes in mental status.  Reviewed expectations re: course of current medical issues. Questions answered. Outlined signs and symptoms indicating need  for more acute intervention. Patient verbalized understanding. After Visit Summary given.         Faustino Congress, NP 10/27/20 1740

## 2020-10-29 LAB — COVID-19, FLU A+B NAA
Influenza A, NAA: NOT DETECTED
Influenza B, NAA: NOT DETECTED
SARS-CoV-2, NAA: NOT DETECTED

## 2020-11-15 ENCOUNTER — Other Ambulatory Visit: Payer: Self-pay

## 2020-11-15 ENCOUNTER — Ambulatory Visit
Admission: EM | Admit: 2020-11-15 | Discharge: 2020-11-15 | Disposition: A | Discharge status: Home / Self Care | Payer: BC Managed Care – PPO | Attending: Family Medicine | Admitting: Family Medicine

## 2020-11-15 ENCOUNTER — Encounter: Payer: Self-pay | Admitting: Emergency Medicine

## 2020-11-15 DIAGNOSIS — R0602 Shortness of breath: Secondary | ICD-10-CM

## 2020-11-15 DIAGNOSIS — R197 Diarrhea, unspecified: Secondary | ICD-10-CM | POA: Diagnosis not present

## 2020-11-15 DIAGNOSIS — Z20822 Contact with and (suspected) exposure to covid-19: Secondary | ICD-10-CM | POA: Diagnosis not present

## 2020-11-15 NOTE — ED Provider Notes (Signed)
[facility]   [id number] 11/15/20 Arrival Time: 1519   CC: COVID symptoms  SUBJECTIVE: History from: patient.  James [name] is a 60 y.o. male who presents with positive Covid exposure, SOB, cough, rhinorrhea and diarrhea since yesterday. Reports that he was around his son this weekend and that his son got positive Covid results today. Denies recent travel. Has negative history of Covid. Has completed Covid vaccines. Has not had booster vaccine. Has not had flu vaccine this year. Has not taken OTC medications for this. There are no aggravating or alleviating factors. Denies fever, chills, fatigue, sinus pain, rhinorrhea, sore throat, wheezing, chest pain, nausea, changes in bladder habits.    ROS: As per HPI.  All other pertinent ROS negative.     Past Medical History:  Diagnosis Date  . Arteriosclerotic cardiovascular disease (ASCVD)    Insignificant disease in 2002 and 2013  . Cardiomyopathy ([place])    CHF and EF of 25% in 02/2012; normal TSH  . Diabetes mellitus without complication ([place])   . Hypercholesteremia   . Hypertension   . Myocardial infarction ([place])    2000 MI  . Obstructive sleep apnea    was positive but never given a CPAP  . Tobacco abuse    Past Surgical History:  Procedure Laterality Date  . CARDIAC CATHETERIZATION  2002  . COLONOSCOPY N/A 03/22/2014   Procedure: COLONOSCOPY;  Surgeon: Danie Binder, MD;  [facility];  Service: Endoscopy;  Laterality: N/A;  11:15 AM  . INGUINAL HERNIA REPAIR Right 06/24/2017   Procedure: HERNIA REPAIR INGUINAL ADULT WITH MESH;  Surgeon: Aviva Signs, MD;  Location: [facility];  Service: General;  Laterality: Right;   No Known Allergies No current facility-administered medications on file prior to encounter.   Current Outpatient Medications on File Prior to Encounter  Medication Sig Dispense Refill  . amLODipine (NORVASC) 10 MG tablet Take 10 mg by mouth daily.    Marland Kitchen aspirin 81 MG tablet Take 81 mg by  mouth daily.    Marland Kitchen atorvastatin (LIPITOR) 40 MG tablet Take 40 mg by mouth daily.    Marland Kitchen azithromycin (ZITHROMAX) 250 MG tablet Take 1 tablet (250 mg total) by mouth daily. Take first 2 tablets together, then 1 every day until finished. 6 tablet 0  . carvedilol (COREG) 25 MG tablet Take 2 tablets (50 mg total) by mouth 2 (two) times daily. 120 tablet 3  . furosemide (LASIX) 40 MG tablet TAKE 1 TABLET (40 MG TOTAL) BY MOUTH EVERY MORNING. (Patient taking differently: Take 40 mg by mouth daily. ) 30 tablet 6  . isosorbide mononitrate (IMDUR) 120 MG 24 hr tablet TAKE 1 TABLET BY MOUTH EVERY DAY 30 tablet 1  . lisinopril (PRINIVIL,ZESTRIL) 40 MG tablet TAKE 1 TABLET BY MOUTH EVERY DAY 30 tablet 1  . metFORMIN (GLUCOPHAGE) 500 MG tablet Take 500 mg by mouth 2 (two) times daily with a meal.     . pantoprazole (PROTONIX) 40 MG tablet TAKE 1 TABLET (40 MG TOTAL) BY MOUTH DAILY. (Patient taking differently: Take 40 mg by mouth daily. ) 30 tablet 9  . pravastatin (PRAVACHOL) 40 MG tablet Take 1 tablet (40 mg total) by mouth every evening. 90 tablet 3  . promethazine-dextromethorphan (PROMETHAZINE-DM) 6.25-15 MG/5ML syrup Take 5 mLs by mouth 4 (four) times daily as needed for cough. 118 mL 0  . spironolactone (ALDACTONE) 25 MG tablet TAKE 1 TABLET (25 MG TOTAL) BY MOUTH DAILY. (Patient taking differently: Take 25 mg by  mouth daily. ) 30 tablet 1   Social History   Socioeconomic History  . Marital status: Married    Spouse name: Not on file  . Number of children: Not on file  . Years of education: Not on file  . Highest education level: Not on file  Occupational History  . Occupation: Full time, [employer]  Tobacco Use  . Smoking status: Current Every Day Smoker    Packs/day: 0.50    Years: 32.00    Pack years: 16.00    Types: Cigarettes    Start date: 10/15/1982  . Smokeless tobacco: Never Used  . Tobacco comment: 3 cigaretts a day  Vaping Use  . Vaping Use: Never used  Substance and Sexual  Activity  . Alcohol use: No    Alcohol/week: 0.0 standard drinks  . Drug use: No  . Sexual activity: Yes    Birth control/protection: None  Other Topics Concern  . Not on file  Social History Narrative   Divorced   No regular exercise   Social Determinants of Health   Financial Resource Strain: Not on file  Food Insecurity: Not on file  Transportation Needs: Not on file  Physical Activity: Not on file  Stress: Not on file  Social Connections: Not on file  Intimate Partner Violence: Not on file   Family History  Problem Relation Age of Onset  . Heart attack Father   . Hypertension Father   . Iron deficiency Father   . Coronary artery disease Mother   . Heart disease Maternal Grandfather        Also paternal grandfather  . Cardiomyopathy Brother        No significant coronary disease by coronary angiography  . Thyroid disease Sister   . Sudden death Other   . Colon cancer Neg Hx     OBJECTIVE:  Vitals:   11/15/20 1531  BP: 137/85  Pulse: 78  Resp: 18  Temp: 97.7 F (36.5 C)  TempSrc: Oral  SpO2: 95%     General appearance: alert; appears fatigued, but nontoxic; speaking in full sentences and tolerating own secretions HEENT: NCAT; Ears: EACs clear, TMs pearly gray; Eyes: PERRL.  EOM grossly intact. Sinuses: nontender; Nose: nares patent with clear rhinorrhea, Throat: oropharynx erythematous, cobblestoning present, tonsils non erythematous or enlarged, uvula midline  Neck: supple without LAD Lungs: unlabored respirations, symmetrical air entry; cough: absent; no respiratory distress; CTAB Heart: regular rate and rhythm.  Radial pulses 2+ symmetrical bilaterally Skin: warm and dry Psychological: alert and cooperative; normal mood and affect  LABS:  No results found for this or any previous visit (from the past 24 hour(s)).   ASSESSMENT & PLAN:  1. Exposure to COVID-19 virus   2. SOB (shortness of breath)   3. Diarrhea, unspecified type     Continue  supportive care at home COVID and flu testing ordered.  It will take between 2-3 days for test results. Someone will contact you regarding abnormal results.   Work note provided Patient should remain in quarantine until they have received Covid results.  If negative you may resume normal activities (go back to work/school) while practicing hand hygiene, social distance, and mask wearing.  If positive, patient should remain in quarantine for at least 5 days from symptom onset AND greater than 72 hours after symptoms resolution (absence of fever without the use of fever-reducing medication and improvement in respiratory symptoms), whichever is longer Get plenty of rest and push fluids Use OTC zyrtec for  nasal congestion, runny nose, and/or sore throat Use OTC flonase for nasal congestion and runny nose Use medications daily for symptom relief Use OTC medications like ibuprofen or tylenol as needed fever or pain Call or go to the ED if you have any new or worsening symptoms such as fever, worsening cough, shortness of breath, chest tightness, chest pain, turning blue, changes in mental status.  Reviewed expectations re: course of current medical issues. Questions answered. Outlined signs and symptoms indicating need for more acute intervention. Patient verbalized understanding. After Visit Summary given.         Faustino Congress, NP 11/15/20 1550

## 2020-11-15 NOTE — Discharge Instructions (Signed)
Your COVID test is pending.  You should self quarantine until the test result is back.    Take Tylenol or ibuprofen as needed for fever or discomfort.  Rest and keep yourself hydrated.    Follow-up with your primary care provider if your symptoms are not improving.     

## 2020-11-15 NOTE — ED Notes (Signed)
Pts son tested positive today.

## 2020-11-15 NOTE — ED Triage Notes (Signed)
Feels SOB when lying in a recliner, productive cough, runny nose since yesterday.

## 2020-11-17 LAB — COVID-19, FLU A+B NAA
Influenza A, NAA: NOT DETECTED
Influenza B, NAA: NOT DETECTED
SARS-CoV-2, NAA: NOT DETECTED

## 2020-12-10 ENCOUNTER — Encounter (HOSPITAL_COMMUNITY): Payer: Self-pay

## 2020-12-10 ENCOUNTER — Other Ambulatory Visit: Payer: Self-pay

## 2020-12-10 ENCOUNTER — Emergency Department (HOSPITAL_COMMUNITY)
Admission: EM | Admit: 2020-12-10 | Discharge: 2020-12-11 | Disposition: A | Discharge status: Home / Self Care | Payer: BC Managed Care – PPO | Attending: Emergency Medicine | Admitting: Emergency Medicine

## 2020-12-10 DIAGNOSIS — I110 Hypertensive heart disease with heart failure: Secondary | ICD-10-CM | POA: Insufficient documentation

## 2020-12-10 DIAGNOSIS — Z8719 Personal history of other diseases of the digestive system: Secondary | ICD-10-CM | POA: Insufficient documentation

## 2020-12-10 DIAGNOSIS — Z7982 Long term (current) use of aspirin: Secondary | ICD-10-CM | POA: Diagnosis not present

## 2020-12-10 DIAGNOSIS — R059 Cough, unspecified: Secondary | ICD-10-CM | POA: Diagnosis not present

## 2020-12-10 DIAGNOSIS — I517 Cardiomegaly: Secondary | ICD-10-CM | POA: Diagnosis not present

## 2020-12-10 DIAGNOSIS — R109 Unspecified abdominal pain: Secondary | ICD-10-CM

## 2020-12-10 DIAGNOSIS — R0602 Shortness of breath: Secondary | ICD-10-CM | POA: Diagnosis not present

## 2020-12-10 DIAGNOSIS — R079 Chest pain, unspecified: Secondary | ICD-10-CM | POA: Diagnosis not present

## 2020-12-10 DIAGNOSIS — R0682 Tachypnea, not elsewhere classified: Secondary | ICD-10-CM | POA: Insufficient documentation

## 2020-12-10 DIAGNOSIS — F17210 Nicotine dependence, cigarettes, uncomplicated: Secondary | ICD-10-CM | POA: Diagnosis not present

## 2020-12-10 DIAGNOSIS — Z79899 Other long term (current) drug therapy: Secondary | ICD-10-CM | POA: Insufficient documentation

## 2020-12-10 DIAGNOSIS — E119 Type 2 diabetes mellitus without complications: Secondary | ICD-10-CM | POA: Insufficient documentation

## 2020-12-10 DIAGNOSIS — I509 Heart failure, unspecified: Secondary | ICD-10-CM | POA: Insufficient documentation

## 2020-12-10 DIAGNOSIS — R1013 Epigastric pain: Secondary | ICD-10-CM | POA: Diagnosis not present

## 2020-12-10 DIAGNOSIS — Z7984 Long term (current) use of oral hypoglycemic drugs: Secondary | ICD-10-CM | POA: Diagnosis not present

## 2020-12-10 DIAGNOSIS — J811 Chronic pulmonary edema: Secondary | ICD-10-CM | POA: Diagnosis not present

## 2020-12-10 DIAGNOSIS — R1084 Generalized abdominal pain: Secondary | ICD-10-CM | POA: Diagnosis not present

## 2020-12-10 NOTE — ED Triage Notes (Signed)
Pt reports chest pain/sob x one week. Pt says today his upper abdomen started hurting. Pt has productive cough per report.

## 2020-12-11 ENCOUNTER — Emergency Department (HOSPITAL_COMMUNITY): Payer: BC Managed Care – PPO

## 2020-12-11 DIAGNOSIS — R079 Chest pain, unspecified: Secondary | ICD-10-CM | POA: Diagnosis not present

## 2020-12-11 DIAGNOSIS — R0602 Shortness of breath: Secondary | ICD-10-CM | POA: Diagnosis not present

## 2020-12-11 DIAGNOSIS — I517 Cardiomegaly: Secondary | ICD-10-CM | POA: Diagnosis not present

## 2020-12-11 DIAGNOSIS — J811 Chronic pulmonary edema: Secondary | ICD-10-CM | POA: Diagnosis not present

## 2020-12-11 DIAGNOSIS — R109 Unspecified abdominal pain: Secondary | ICD-10-CM | POA: Diagnosis not present

## 2020-12-11 LAB — CBC WITH DIFFERENTIAL/PLATELET
Abs Immature Granulocytes: 0.02 10*3/uL (ref 0.00–0.07)
Basophils Absolute: 0 10*3/uL (ref 0.0–0.1)
Basophils Relative: 0 %
Eosinophils Absolute: 0.2 10*3/uL (ref 0.0–0.5)
Eosinophils Relative: 2 %
HCT: 45.2 % (ref 39.0–52.0)
Hemoglobin: 15.2 g/dL (ref 13.0–17.0)
Immature Granulocytes: 0 %
Lymphocytes Relative: 28 %
Lymphs Abs: 2.6 10*3/uL (ref 0.7–4.0)
MCH: 31.9 pg (ref 26.0–34.0)
MCHC: 33.6 g/dL (ref 30.0–36.0)
MCV: 94.8 fL (ref 80.0–100.0)
Monocytes Absolute: 0.7 10*3/uL (ref 0.1–1.0)
Monocytes Relative: 7 %
Neutro Abs: 5.8 10*3/uL (ref 1.7–7.7)
Neutrophils Relative %: 63 %
Platelets: 176 10*3/uL (ref 150–400)
RBC: 4.77 MIL/uL (ref 4.22–5.81)
RDW: 12.9 % (ref 11.5–15.5)
WBC: 9.2 10*3/uL (ref 4.0–10.5)
nRBC: 0 % (ref 0.0–0.2)

## 2020-12-11 LAB — COMPREHENSIVE METABOLIC PANEL
ALT: 28 U/L (ref 0–44)
AST: 19 U/L (ref 15–41)
Albumin: 4 g/dL (ref 3.5–5.0)
Alkaline Phosphatase: 69 U/L (ref 38–126)
Anion gap: 9 (ref 5–15)
BUN: 28 mg/dL — ABNORMAL HIGH (ref 6–20)
CO2: 22 mmol/L (ref 22–32)
Calcium: 8.4 mg/dL — ABNORMAL LOW (ref 8.9–10.3)
Chloride: 106 mmol/L (ref 98–111)
Creatinine, Ser: 1.03 mg/dL (ref 0.61–1.24)
GFR, Estimated: >60 mL/min (ref >60)
Glucose, Bld: 140 mg/dL — ABNORMAL HIGH (ref 70–99)
Potassium: 3.4 mmol/L — ABNORMAL LOW (ref 3.5–5.1)
Sodium: 137 mmol/L (ref 135–145)
Total Bilirubin: 0.9 mg/dL (ref 0.3–1.2)
Total Protein: 6.9 g/dL (ref 6.5–8.1)

## 2020-12-11 LAB — CBG MONITORING, ED: Glucose-Capillary: 133 mg/dL — ABNORMAL HIGH (ref 70–99)

## 2020-12-11 LAB — TROPONIN I (HIGH SENSITIVITY)
Troponin I (High Sensitivity): 57 ng/L — ABNORMAL HIGH (ref <18)
Troponin I (High Sensitivity): 61 ng/L — ABNORMAL HIGH (ref <18)

## 2020-12-11 LAB — D-DIMER, QUANTITATIVE: D-Dimer, Quant: 0.51 ug/mL-FEU — ABNORMAL HIGH (ref 0.00–0.50)

## 2020-12-11 LAB — LACTIC ACID, PLASMA
Lactic Acid, Venous: 0.8 mmol/L (ref 0.5–1.9)
Lactic Acid, Venous: 1.4 mmol/L (ref 0.5–1.9)

## 2020-12-11 LAB — BRAIN NATRIURETIC PEPTIDE: B Natriuretic Peptide: 413 pg/mL — ABNORMAL HIGH (ref 0.0–100.0)

## 2020-12-11 LAB — LIPASE, BLOOD: Lipase: 24 U/L (ref 11–51)

## 2020-12-11 MED ORDER — LACTATED RINGERS IV BOLUS
1000.0000 mL | Freq: Once | INTRAVENOUS | Status: AC
  Administered 2020-12-11: 1000 mL via INTRAVENOUS

## 2020-12-11 MED ORDER — IOHEXOL 300 MG/ML  SOLN
100.0000 mL | Freq: Once | INTRAMUSCULAR | Status: AC | PRN
  Administered 2020-12-11: 100 mL via INTRAVENOUS

## 2020-12-11 MED ORDER — DOXYCYCLINE HYCLATE 100 MG PO CAPS: 100.0000 mg | ORAL_CAPSULE | Freq: Two times a day (BID) | ORAL | 0 refills | Status: DC

## 2020-12-11 MED ORDER — ONDANSETRON HCL 4 MG/2ML IJ SOLN
4.0000 mg | Freq: Once | INTRAMUSCULAR | Status: AC
  Administered 2020-12-11: 4 mg via INTRAVENOUS
  Filled 2020-12-11: qty 2

## 2020-12-11 MED ORDER — FENTANYL CITRATE (PF) 100 MCG/2ML IJ SOLN
100.0000 ug | Freq: Once | INTRAMUSCULAR | Status: AC
  Administered 2020-12-11: 100 ug via INTRAVENOUS
  Filled 2020-12-11: qty 2

## 2020-12-11 MED ORDER — FUROSEMIDE 10 MG/ML IJ SOLN
80.0000 mg | Freq: Once | INTRAMUSCULAR | Status: AC
  Administered 2020-12-11: 80 mg via INTRAVENOUS
  Filled 2020-12-11: qty 8

## 2020-12-11 MED ORDER — ALBUTEROL SULFATE HFA 108 (90 BASE) MCG/ACT IN AERS
2.0000 | INHALATION_SPRAY | Freq: Once | RESPIRATORY_TRACT | Status: AC
  Administered 2020-12-11: 2 via RESPIRATORY_TRACT
  Filled 2020-12-11: qty 6.7

## 2020-12-11 MED ORDER — BENZONATATE 100 MG PO CAPS
100.0000 mg | ORAL_CAPSULE | Freq: Once | ORAL | Status: AC
  Administered 2020-12-11: 100 mg via ORAL
  Filled 2020-12-11: qty 1

## 2020-12-11 NOTE — ED Notes (Signed)
Pt gone to CT 

## 2020-12-11 NOTE — Discharge Instructions (Signed)
Double  your lasix dose for the next 3 days. Either take twice the amount at the same time or take the same dose twice daily, whichever is easier for you. Return for worsening cough, fever, shortness of breath or other concerns.

## 2020-12-11 NOTE — ED Provider Notes (Signed)
[facility] EMERGENCY DEPARTMENT Provider Note   CSN: [id number] Arrival date & time: 12/10/20  2320     History Chief Complaint  Patient presents with  . Abdominal Pain    epigastric    James [name] is a 60 y.o. male.  Patient with a couple complaints.  Patient apparently had some cough for last couple weeks.  Today seem to gotten worse.  Today became productive with yellowish sputum.  Patient describes shortness of breath.  He also describes abdominal discomfort.  He does not know if that was making him short of breath or if it separate.  Patient without any nausea vomiting diarrhea or constipation but then did have one episode of vomiting before he came in.  No other associated symptoms.  No fevers.  No sick contacts.   Abdominal Pain      Past Medical History:  Diagnosis Date  . Arteriosclerotic cardiovascular disease (ASCVD)    Insignificant disease in 2002 and 2013  . Cardiomyopathy ([place])    CHF and EF of 25% in 02/2012; normal TSH  . Diabetes mellitus without complication ([place])   . Hypercholesteremia   . Hypertension   . Myocardial infarction ([place])    2000 MI  . Obstructive sleep apnea    was positive but never given a CPAP  . Tobacco abuse     Patient Active Problem List   Diagnosis Date Noted  . Non-recurrent unilateral inguinal hernia without obstruction or gangrene   . Hyperlipidemia 11/04/2012  . Aortic root dilatation ([place]) 08/05/2012  . Cholelithiasis   . Obstructive sleep apnea 04/28/2012  . Cardiomyopathy, nonischemic ([place]) 04/03/2012  . Hypertension 08/07/2010  . 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/07/2010    Past Surgical History:  Procedure Laterality Date  . CARDIAC CATHETERIZATION  2002  . COLONOSCOPY N/A 03/22/2014   Procedure: COLONOSCOPY;  Surgeon: Danie Binder, MD;  Location: [facility] ENDO SUITE;  Service: Endoscopy;  Laterality: N/A;  11:15 AM  . INGUINAL HERNIA REPAIR Right 06/24/2017   Procedure: HERNIA REPAIR INGUINAL  ADULT WITH MESH;  Surgeon: Aviva Signs, MD;  Location: [facility] ORS;  Service: General;  Laterality: Right;       Family History  Problem Relation Age of Onset  . Heart attack Father   . Hypertension Father   . Iron deficiency Father   . Coronary artery disease Mother   . Heart disease Maternal Grandfather        Also paternal grandfather  . Cardiomyopathy Brother        No significant coronary disease by coronary angiography  . Thyroid disease Sister   . Sudden death Other   . Colon cancer Neg Hx     Social History   Tobacco Use  . Smoking status: Current Every Day Smoker    Packs/day: 0.50    Years: 32.00    Pack years: 16.00    Types: Cigarettes    Start date: 10/15/1982  . Smokeless tobacco: Never Used  . Tobacco comment: 3 cigaretts a day  Vaping Use  . Vaping Use: Never used  Substance Use Topics  . Alcohol use: No    Alcohol/week: 0.0 standard drinks  . Drug use: No    Home Medications Prior to Admission medications   Medication Sig Start Date End Date Taking? Authorizing Provider  doxycycline (VIBRAMYCIN) 100 MG capsule Take 1 capsule (100 mg total) by mouth 2 (two) times daily. One po bid x 7 days 12/11/20  Yes Antavion Bartoszek, Corene Cornea, MD  amLODipine ([place])  10 MG tablet Take 10 mg by mouth daily.    [provider]  aspirin 81 MG tablet Take 81 mg by mouth daily.    [provider]  atorvastatin (LIPITOR) 40 MG tablet Take 40 mg by mouth daily.    [provider]  azithromycin (ZITHROMAX) 250 MG tablet Take 1 tablet (250 mg total) by mouth daily. Take first 2 tablets together, then 1 every day until finished. 05/20/20   Nuala Alpha A, PA-C  carvedilol (COREG) 25 MG tablet Take 2 tablets (50 mg total) by mouth 2 (two) times daily. 03/31/13   Yehuda Savannah, MD  furosemide (LASIX) 40 MG tablet TAKE 1 TABLET (40 MG TOTAL) BY MOUTH EVERY MORNING. Patient taking differently: Take 40 mg by mouth daily.  08/02/13   Arnoldo Lenis, MD   isosorbide mononitrate (IMDUR) 120 MG 24 hr tablet TAKE 1 TABLET BY MOUTH EVERY DAY 01/18/14   Lendon Colonel, NP  lisinopril (PRINIVIL,ZESTRIL) 40 MG tablet TAKE 1 TABLET BY MOUTH EVERY DAY 01/18/14   Lendon Colonel, NP  metFORMIN (GLUCOPHAGE) 500 MG tablet Take 500 mg by mouth 2 (two) times daily with a meal.     [provider]  pantoprazole (PROTONIX) 40 MG tablet TAKE 1 TABLET (40 MG TOTAL) BY MOUTH DAILY. Patient taking differently: Take 40 mg by mouth daily.  03/22/13   Lendon Colonel, NP  pravastatin (PRAVACHOL) 40 MG tablet Take 1 tablet (40 mg total) by mouth every evening. 10/31/12   Yehuda Savannah, MD  promethazine-dextromethorphan (PROMETHAZINE-DM) 6.25-15 MG/5ML syrup Take 5 mLs by mouth 4 (four) times daily as needed for cough. 10/27/20   Faustino Congress, NP  spironolactone (ALDACTONE) 25 MG tablet TAKE 1 TABLET (25 MG TOTAL) BY MOUTH DAILY. Patient taking differently: Take 25 mg by mouth daily.  11/14/15   Herminio Commons, MD    Allergies    Patient has no known allergies.  Review of Systems   Review of Systems  Gastrointestinal: Positive for abdominal pain.  All other systems reviewed and are negative.   Physical Exam Updated Vital Signs BP 101/74   Pulse 76   Temp 98.7 F (37.1 C) (Oral)   Resp 13   Ht 6\' 6"  (1.981 m)   Wt 117.9 kg   SpO2 98%   BMI 30.05 kg/m   Physical Exam Vitals and nursing note reviewed.  Constitutional:      Appearance: He is well-developed and well-nourished.  HENT:     Head: Normocephalic and atraumatic.     Mouth/Throat:     Mouth: Mucous membranes are moist.     Pharynx: Oropharynx is clear.  Eyes:     Pupils: Pupils are equal, round, and reactive to light.  Cardiovascular:     Rate and Rhythm: Normal rate.  Pulmonary:     Effort: Pulmonary effort is normal. Tachypnea present. No respiratory distress.     Breath sounds: Rales present.  Abdominal:     General: There is no distension.      Tenderness: There is generalized abdominal tenderness.  Musculoskeletal:        General: Normal range of motion.     Cervical back: Normal range of motion.  Skin:    General: Skin is warm and dry.     Coloration: Skin is not jaundiced or pale.  Neurological:     General: No focal deficit present.     Mental Status: He is alert.     ED  Results / Procedures / Treatments   Labs (all labs ordered are listed, but only abnormal results are displayed) Labs Reviewed  COMPREHENSIVE METABOLIC PANEL - Abnormal; Notable for the following components:      Result Value   Potassium 3.4 (*)    Glucose, Bld 140 (*)    BUN 28 (*)    Calcium 8.4 (*)    All other components within normal limits  BRAIN NATRIURETIC PEPTIDE - Abnormal; Notable for the following components:   B Natriuretic Peptide 413.0 (*)    All other components within normal limits  D-DIMER, QUANTITATIVE - Abnormal; Notable for the following components:   D-Dimer, Quant 0.51 (*)    All other components within normal limits  CBG MONITORING, ED - Abnormal; Notable for the following components:   Glucose-Capillary 133 (*)    All other components within normal limits  TROPONIN I (HIGH SENSITIVITY) - Abnormal; Notable for the following components:   Troponin I (High Sensitivity) 57 (*)    All other components within normal limits  TROPONIN I (HIGH SENSITIVITY) - Abnormal; Notable for the following components:   Troponin I (High Sensitivity) 61 (*)    All other components within normal limits  CBC WITH DIFFERENTIAL/PLATELET  LIPASE, BLOOD  LACTIC ACID, PLASMA  LACTIC ACID, PLASMA  URINALYSIS, ROUTINE W REFLEX MICROSCOPIC    EKG EKG Interpretation  Date/Time:  Saturday December 10 2020 23:32:42 EST Ventricular Rate:  90 PR Interval:    QRS Duration: 92 QT Interval:  375 QTC Calculation: 459 R Axis:   -51 Text Interpretation: Sinus rhythm Multiform ventricular premature complexes Probable left atrial enlargement Left  anterior fascicular block Anterior infarct, old Abnormal T, consider ischemia, lateral leads inverted T waves in I and avf may be slightly worse but don't appear new appears to have improved TWI in V5-6 compared to august 2021 Confirmed by Merrily Pew [phone]) on 12/11/2020 12:37:41 AM   Radiology CT ABDOMEN PELVIS W CONTRAST  Result Date: 12/11/2020 CLINICAL DATA:  Chest and abdominal pain over the last week with shortness of breath. EXAM: CT ABDOMEN AND PELVIS WITH CONTRAST TECHNIQUE: Multidetector CT imaging of the abdomen and pelvis was performed using the standard protocol following bolus administration of intravenous contrast. CONTRAST:  188mL OMNIPAQUE IOHEXOL 300 MG/ML  SOLN COMPARISON:  None. FINDINGS: Lower chest: Normal Hepatobiliary: Few tiny liver cysts. No calcified gallstones. No ductal dilatation. No fatty change. Pancreas: Normal Spleen: Normal Adrenals/Urinary Tract: Adrenal glands are normal. Kidneys are normal. No cyst, mass, stone or hydronephrosis. Stomach/Bowel: Stomach and small intestine are normal. Colon within normal limits. No evidence of diverticulosis or diverticulitis. Vascular/Lymphatic: Aortic atherosclerosis. No aneurysm. IVC is normal. No retroperitoneal adenopathy. Reproductive: Normal except for a mildly enlarged prostate. Other: No free fluid or air. Musculoskeletal: Left inguinal hernia containing fat. Previous hernia repair on the right. Pronounced lower lumbar degenerative changes of a chronic nature. IMPRESSION: 1. No acute finding. No cause of the presenting symptoms is identified. 2. Aortic atherosclerosis. 3. Left inguinal hernia containing fat. 4. Pronounced lower lumbar degenerative changes of a chronic nature. Aortic Atherosclerosis (ICD10-I70.0). Electronically Signed   By: Nelson Chimes M.D.   On: 12/11/2020 01:37   DG Chest Portable 1 View  Result Date: 12/11/2020 CLINICAL DATA:  Chest pain and shortness of breath EXAM: PORTABLE CHEST 1 VIEW COMPARISON:   May 20, 2020 FINDINGS: The heart size and mediastinal contours are mildly enlarged. There is prominence of the central pulmonary vasculature. No large airspace consolidation or pleural effusion.  The visualized skeletal structures are unremarkable. IMPRESSION: Mild cardiomegaly and pulmonary vascular congestion. Electronically Signed   By: Prudencio Pair M.D.   On: 12/11/2020 00:16    Procedures Procedures   Medications Ordered in ED Medications  fentaNYL (SUBLIMAZE) injection 100 mcg (100 mcg Intravenous Given 12/11/20 0019)  ondansetron (ZOFRAN) injection 4 mg (4 mg Intravenous Given 12/11/20 0019)  lactated ringers bolus 1,000 mL (0 mLs Intravenous Stopped 12/11/20 0225)  albuterol (VENTOLIN HFA) 108 (90 Base) MCG/ACT inhaler 2 puff (2 puffs Inhalation Given 12/11/20 0225)  benzonatate (TESSALON) capsule 100 mg (100 mg Oral Given 12/11/20 0019)  iohexol (OMNIPAQUE) 300 MG/ML solution 100 mL (100 mLs Intravenous Contrast Given 12/11/20 0111)  furosemide (LASIX) injection 80 mg (80 mg Intravenous Given 12/11/20 0225)    ED Course  I have reviewed the triage vital signs and the nursing notes.  Pertinent labs & imaging results that were available during my care of the patient were reviewed by me and considered in my medical decision making (see chart for details).    MDM Rules/Calculators/A&P                          Difficult to ascertain if the patient has pneumonia or just heart failure.  Treated for both in emergency room.  Patient had significant provement.  His abdominal pain did dissipated.  CT of his abdomen pelvis were negative for any obvious abnormalities. At time of discharge his RR was normal, O2 normal, pain free and symptom free.   Final Clinical Impression(s) / ED Diagnoses Final diagnoses:  Abdominal pain, unspecified abdominal location  Cough    Rx / DC Orders ED Discharge Orders         Ordered    doxycycline (VIBRAMYCIN) 100 MG capsule  2 times daily        12/11/20  0404           Aliha Diedrich, Corene Cornea, MD 12/11/20 0532

## 2020-12-12 DIAGNOSIS — I10 Essential (primary) hypertension: Secondary | ICD-10-CM | POA: Diagnosis not present

## 2020-12-12 DIAGNOSIS — I509 Heart failure, unspecified: Secondary | ICD-10-CM | POA: Diagnosis not present

## 2020-12-12 DIAGNOSIS — E1165 Type 2 diabetes mellitus with hyperglycemia: Secondary | ICD-10-CM | POA: Diagnosis not present

## 2020-12-12 DIAGNOSIS — J42 Unspecified chronic bronchitis: Secondary | ICD-10-CM | POA: Diagnosis not present

## 2020-12-12 DIAGNOSIS — I2510 Atherosclerotic heart disease of native coronary artery without angina pectoris: Secondary | ICD-10-CM | POA: Diagnosis not present

## 2020-12-26 DIAGNOSIS — E1140 Type 2 diabetes mellitus with diabetic neuropathy, unspecified: Secondary | ICD-10-CM | POA: Diagnosis not present

## 2020-12-26 DIAGNOSIS — B351 Tinea unguium: Secondary | ICD-10-CM | POA: Diagnosis not present

## 2020-12-26 DIAGNOSIS — L851 Acquired keratosis [keratoderma] palmaris et plantaris: Secondary | ICD-10-CM | POA: Diagnosis not present

## 2020-12-26 DIAGNOSIS — B353 Tinea pedis: Secondary | ICD-10-CM | POA: Diagnosis not present

## 2020-12-29 DIAGNOSIS — E119 Type 2 diabetes mellitus without complications: Secondary | ICD-10-CM | POA: Diagnosis not present

## 2020-12-29 DIAGNOSIS — Z0001 Encounter for general adult medical examination with abnormal findings: Secondary | ICD-10-CM | POA: Diagnosis not present

## 2020-12-29 DIAGNOSIS — I2510 Atherosclerotic heart disease of native coronary artery without angina pectoris: Secondary | ICD-10-CM | POA: Diagnosis not present

## 2020-12-29 DIAGNOSIS — I10 Essential (primary) hypertension: Secondary | ICD-10-CM | POA: Diagnosis not present

## 2020-12-29 DIAGNOSIS — F17210 Nicotine dependence, cigarettes, uncomplicated: Secondary | ICD-10-CM | POA: Diagnosis not present

## 2021-03-01 DIAGNOSIS — I2510 Atherosclerotic heart disease of native coronary artery without angina pectoris: Secondary | ICD-10-CM | POA: Diagnosis not present

## 2021-03-01 DIAGNOSIS — I509 Heart failure, unspecified: Secondary | ICD-10-CM | POA: Diagnosis not present

## 2021-03-01 DIAGNOSIS — E119 Type 2 diabetes mellitus without complications: Secondary | ICD-10-CM | POA: Diagnosis not present

## 2021-03-01 DIAGNOSIS — I10 Essential (primary) hypertension: Secondary | ICD-10-CM | POA: Diagnosis not present

## 2021-03-06 DIAGNOSIS — E1140 Type 2 diabetes mellitus with diabetic neuropathy, unspecified: Secondary | ICD-10-CM | POA: Diagnosis not present

## 2021-03-06 DIAGNOSIS — L851 Acquired keratosis [keratoderma] palmaris et plantaris: Secondary | ICD-10-CM | POA: Diagnosis not present

## 2021-03-06 DIAGNOSIS — B353 Tinea pedis: Secondary | ICD-10-CM | POA: Diagnosis not present

## 2021-03-06 DIAGNOSIS — B351 Tinea unguium: Secondary | ICD-10-CM | POA: Diagnosis not present

## 2021-04-05 NOTE — Progress Notes (Signed)
CARDIOLOGY CONSULT NOTE       Patient ID: James [name] MRN: [id number] DOB/AGE: 11/06/1960 60 y.o.   Referring Physician: Legrand Rams Primary Physician: Rosita Fire, MD Primary Cardiologist: New  Reason for Consultation: CHF/HTN  Active Problems:   * No active hospital problems. *   HPI:  60 y.o. referred by Dr Legrand Rams for HTN and CHF. He has not had close cardiology f/u Use to see Dr Raliegh Ip and Dr Lattie Haw. History of HTN, smoking, OSA HLD. Cath 2013 no CAD non ischemic DCM EF 45% Dilated aortic root 4.6 cm by TTE. Last echo 09/02/14 showed EF 45-50% trivial MR/TR AV was tri leaflet He has been maintained on coreg 25 mg bid imdur 120 mg daily , Lasix 40 mg Lisinipril 40 mg daily and aldactone 25 mg daily   Lab review from primary 04/21/20 LDL 116 Cr 1.09 K 4.2 A1c 6 Normal LFTls Hct 46.2   He is married Works at [employer] for > 30 years. Has an 70 yo step son Like to hunt/fish works garden and yard Functional class one  Has not been taking coreg and had relative tachycardia and PVC on ECG today   Discussed smoking cessation <10 minutes  Discussed lung cancer screening CT  ROS All other systems reviewed and negative except as noted above  Past Medical History:  Diagnosis Date   Arteriosclerotic cardiovascular disease (ASCVD)    Insignificant disease in 2002 and 2013   Cardiomyopathy ([place])    CHF and EF of 25% in 02/2012; normal TSH   Diabetes mellitus without complication (H[facility])    Hypercholesteremia    Hypertension    Myocardial infarction ([place])    2000 MI   Obstructive sleep apnea    was positive but never given a CPAP   Tobacco abuse     Family History  Problem Relation Age of Onset   Heart attack Father    Hypertension Father    Iron deficiency Father    Coronary artery disease Mother    Heart disease Maternal Grandfather        Also paternal grandfather   Cardiomyopathy Brother        No significant coronary disease by coronary angiography   Thyroid disease  Sister    Sudden death Other    Colon cancer Neg Hx     Social History   Socioeconomic History   Marital status: Married    Spouse name: Not on file   Number of children: Not on file   Years of education: Not on file   Highest education level: Not on file  Occupational History   Occupation: Full time, [employer]  Tobacco Use   Smoking status: Every Day    Packs/day: 0.50    Years: 32.00    Pack years: 16.00    Types: Cigarettes    Start date: 10/15/1982   Smokeless tobacco: Never   Tobacco comments:    3 cigaretts a day  Vaping Use   Vaping Use: Never used  Substance and Sexual Activity   Alcohol use: No    Alcohol/week: 0.0 standard drinks   Drug use: No   Sexual activity: Yes    Birth control/protection: None  Other Topics Concern   Not on file  Social History Narrative   Divorced   No regular exercise   Social Determinants of Health   Financial Resource Strain: Not on file  Food Insecurity: Not on file  Transportation Needs: Not on file  Physical Activity: Not  on file  Stress: Not on file  Social Connections: Not on file  Intimate Partner Violence: Not on file    Past Surgical History:  Procedure Laterality Date   CARDIAC CATHETERIZATION  2002   COLONOSCOPY N/A 03/22/2014   Procedure: COLONOSCOPY;  Surgeon: Danie Binder, MD;  Location: [facility] ENDO SUITE;  Service: Endoscopy;  Laterality: N/A;  11:15 AM   INGUINAL HERNIA REPAIR Right 06/24/2017   Procedure: HERNIA REPAIR INGUINAL ADULT WITH MESH;  Surgeon: Aviva Signs, MD;  Location: [facility] ORS;  Service: General;  Laterality: Right;      Current Outpatient Medications:    aspirin 81 MG tablet, Take 81 mg by mouth daily., Disp: , Rfl:    furosemide (LASIX) 40 MG tablet, TAKE 1 TABLET (40 MG TOTAL) BY MOUTH EVERY MORNING. (Patient taking differently: Take 40 mg by mouth daily.), Disp: 30 tablet, Rfl: 6   isosorbide mononitrate (IMDUR) 120 MG 24 hr tablet, TAKE 1 TABLET BY MOUTH EVERY DAY, Disp: 30 tablet, Rfl:  1   lisinopril (PRINIVIL,ZESTRIL) 40 MG tablet, TAKE 1 TABLET BY MOUTH EVERY DAY, Disp: 30 tablet, Rfl: 1   metFORMIN (GLUCOPHAGE) 500 MG tablet, Take 500 mg by mouth 2 (two) times daily with a meal. , Disp: , Rfl:    pantoprazole (PROTONIX) 40 MG tablet, TAKE 1 TABLET (40 MG TOTAL) BY MOUTH DAILY. (Patient taking differently: Take 40 mg by mouth daily.), Disp: 30 tablet, Rfl: 9   promethazine-dextromethorphan (PROMETHAZINE-DM) 6.25-15 MG/5ML syrup, Take 5 mLs by mouth 4 (four) times daily as needed for cough., Disp: 118 mL, Rfl: 0   spironolactone (ALDACTONE) 25 MG tablet, TAKE 1 TABLET (25 MG TOTAL) BY MOUTH DAILY. (Patient taking differently: Take 25 mg by mouth daily.), Disp: 30 tablet, Rfl: 1    Physical Exam: Blood pressure 118/62, pulse 89, height 6\' 6"  (1.981 m), weight 116.6 kg, SpO2 97 %.    Affect appropriate Healthy:  appears stated age 60: normal Neck supple with no adenopathy JVP normal no bruits no thyromegaly Lungs clear with no wheezing and good diaphragmatic motion Heart:  S1/S2 no murmur, no rub, gallop or click PMI normal Abdomen: benighn, BS positve, no tenderness, no AAA no bruit.  No HSM or HJR Distal pulses intact with no bruits No edema Neuro non-focal Skin warm and dry No muscular weakness   Labs:   Lab Results  Component Value Date   WBC 9.2 12/10/2020   HGB 15.2 12/10/2020   HCT 45.2 12/10/2020   MCV 94.8 12/10/2020   PLT 176 12/10/2020   No results for input(s): NA, K, CL, CO2, BUN, CREATININE, CALCIUM, PROT, BILITOT, ALKPHOS, ALT, AST, GLUCOSE in the last 168 hours.  Invalid input(s): LABALBU Lab Results  Component Value Date   CKTOTAL 141 07/26/2010   CKMB 2.4 07/26/2010   TROPONINI <0.03 11/25/2016    Lab Results  Component Value Date   CHOL 190 10/31/2012   CHOL  07/26/2010    182        ATP III CLASSIFICATION:  <200     mg/dL   Desirable  200-239  mg/dL   Borderline High  >=240    mg/dL   High          Lab Results   Component Value Date   HDL 38 (L) 10/31/2012   HDL 39 (L) 07/26/2010   Lab Results  Component Value Date   LDLCALC 123 (H) 10/31/2012   LDLCALC (H) 07/26/2010    121  Total Cholesterol/HDL:CHD Risk Coronary Heart Disease Risk Table                     Men   Women  1/2 Average Risk   3.4   3.3  Average Risk       5.0   4.4  2 X Average Risk   9.6   7.1  3 X Average Risk  23.4   11.0        Use the calculated Patient Ratio above and the CHD Risk Table to determine the patient's CHD Risk.        ATP III CLASSIFICATION (LDL):  <100     mg/dL   Optimal  100-129  mg/dL   Near or Above                    Optimal  130-159  mg/dL   Borderline  160-189  mg/dL   High  >190     mg/dL   Very High   Lab Results  Component Value Date   TRIG 143 10/31/2012   TRIG 111 07/26/2010   Lab Results  Component Value Date   CHOLHDL 5.0 10/31/2012   CHOLHDL 4.7 07/26/2010   No results found for: LDLDIRECT    Radiology: No results found.  EKG: SR LAFB PVC IVCD  12/12/20    ASSESSMENT AND PLAN:   DCM:  historically non ischemic. Most recent EF TTE 2015 45-50%  Needs updated echo Discussed changing ACE to [facility] if EF down. Resume coreg 6.25 bid  Smoking/COPD:  counseled on cessation < 10 minutes CXR 12/11/20 no lesions Lung cancer CT next year HTN;  well controlled  Dilated aortic root. In setting of tri leaflet AV If dilated on echo will consider f/u gated chest CTA  HLD:  continue statin labs with primary  DM:  Discussed low carb diet.  Target hemoglobin A1c is 6.5 or less.  Continue current medications. OSA:  encouraged CPAP use    Echo for DCM Start coreg 6.25 bid  F/U 6 months if EF stable by echo   Signed: Jenkins Rouge 04/18/2021, 1:10 PM

## 2021-04-18 ENCOUNTER — Other Ambulatory Visit: Payer: Self-pay

## 2021-04-18 ENCOUNTER — Ambulatory Visit (INDEPENDENT_AMBULATORY_CARE_PROVIDER_SITE_OTHER): Payer: BC Managed Care – PPO | Admitting: Cardiovascular Disease

## 2021-04-18 ENCOUNTER — Encounter: Payer: Self-pay | Admitting: Cardiovascular Disease

## 2021-04-18 VITALS — BP 118/62 | HR 89 | Ht 78.0 in | Wt 257.0 lb

## 2021-04-18 DIAGNOSIS — I10 Essential (primary) hypertension: Secondary | ICD-10-CM

## 2021-04-18 DIAGNOSIS — E785 Hyperlipidemia, unspecified: Secondary | ICD-10-CM

## 2021-04-18 DIAGNOSIS — I428 Other cardiomyopathies: Secondary | ICD-10-CM | POA: Diagnosis not present

## 2021-04-18 DIAGNOSIS — F17200 Nicotine dependence, unspecified, uncomplicated: Secondary | ICD-10-CM

## 2021-04-18 DIAGNOSIS — I493 Ventricular premature depolarization: Secondary | ICD-10-CM | POA: Diagnosis not present

## 2021-04-18 MED ORDER — CARVEDILOL 6.25 MG PO TABS: 6.2500 mg | ORAL_TABLET | Freq: Two times a day (BID) | ORAL | 3 refills | Status: DC

## 2021-04-18 NOTE — Patient Instructions (Addendum)
Medication Instructions:  Your physician has recommended you make the following change in your medication:  1-START Carvedilol 6.25 mg by mouth twice daily  *If you need a refill on your cardiac medications before your next appointment, please call your pharmacy*  Lab Work:  If you have labs (blood work) drawn today and your tests are completely normal, you will receive your results only by: [place] (if you have MyChart) OR A paper copy in the mail If you have any lab test that is abnormal or we need to change your treatment, we will call you to review the results.  Testing/Procedures: Your physician has requested that you have an echocardiogram. Echocardiography is a painless test that uses sound waves to create images of your heart. It provides your doctor with information about the size and shape of your heart and how well your heart's chambers and valves are working. This procedure takes approximately one hour. There are no restrictions for this procedure.  Follow-Up: At [facility], you and your health needs are our priority.  As part of our continuing mission to provide you with exceptional heart care, we have created designated Provider Care Teams.  These Care Teams include your primary Cardiologist (physician) and Advanced Practice Providers (APPs -  Physician Assistants and Nurse Practitioners) who all work together to provide you with the care you need, when you need it.  We recommend signing up for the patient portal called "MyChart".  Sign up information is provided on this After Visit Summary.  MyChart is used to connect with patients for Virtual Visits (Telemedicine).  Patients are able to view lab/test results, encounter notes, upcoming appointments, etc.  Non-urgent messages can be sent to your provider as well.   To learn more about what you can do with MyChart, go to [email or web address].    Your next appointment:   6 month(s)  The format for your next  appointment:   In Person  Provider:   You may see Dr. Johnsie Cancel or one of the following Advanced Practice Providers on your designated Care Team:   [place], PA-C  Ermalinda Barrios, [place]

## 2021-04-20 ENCOUNTER — Other Ambulatory Visit: Payer: Self-pay

## 2021-04-20 ENCOUNTER — Ambulatory Visit (HOSPITAL_COMMUNITY)
Admission: RE | Admit: 2021-04-20 | Discharge: 2021-04-20 | Disposition: A | Discharge status: Home / Self Care | Payer: BC Managed Care – PPO | Source: Ambulatory Visit | Attending: Cardiovascular Disease | Admitting: Cardiovascular Disease

## 2021-04-20 DIAGNOSIS — I10 Essential (primary) hypertension: Secondary | ICD-10-CM

## 2021-04-20 DIAGNOSIS — I428 Other cardiomyopathies: Secondary | ICD-10-CM

## 2021-04-20 DIAGNOSIS — E785 Hyperlipidemia, unspecified: Secondary | ICD-10-CM | POA: Diagnosis not present

## 2021-04-20 LAB — ECHOCARDIOGRAM COMPLETE
Area-P 1/2: 4.71 cm2
Calc EF: 32.6 %
S' Lateral: 5.9 cm
Single Plane A2C EF: 34.7 %
Single Plane A4C EF: 26.1 %

## 2021-04-20 NOTE — Progress Notes (Signed)
*  PRELIMINARY RESULTS* Echocardiogram 2D Echocardiogram has been performed.  James [name] 04/20/2021, 10:13 AM

## 2021-04-21 ENCOUNTER — Telehealth: Payer: Self-pay

## 2021-04-21 MED ORDER — ENTRESTO 24-26 MG PO TABS: 1.0000 | ORAL_TABLET | Freq: Two times a day (BID) | ORAL | 11 refills | Status: DC

## 2021-04-21 NOTE — Telephone Encounter (Signed)
-----   Message from Josue Hector, MD sent at 04/21/2021  8:52 AM EDT ----- EF much worse 25% he has not had good f/u Stop ACE and start entresto after 36 hours at low dose F/U with pharm D to titrate F/U office 6 weeks

## 2021-04-21 NOTE — Telephone Encounter (Signed)
I spoke with patient and relayed echo results. He will stop lisinopril and start Entresto 24/26 mg BID on 04/24/21. 30 day FREE trial e-scribed to [facility]. 6 week f/u scheduled.

## 2021-05-15 DIAGNOSIS — L851 Acquired keratosis [keratoderma] palmaris et plantaris: Secondary | ICD-10-CM | POA: Diagnosis not present

## 2021-05-15 DIAGNOSIS — B351 Tinea unguium: Secondary | ICD-10-CM | POA: Diagnosis not present

## 2021-05-15 DIAGNOSIS — E1140 Type 2 diabetes mellitus with diabetic neuropathy, unspecified: Secondary | ICD-10-CM | POA: Diagnosis not present

## 2021-05-15 DIAGNOSIS — B353 Tinea pedis: Secondary | ICD-10-CM | POA: Diagnosis not present

## 2021-05-22 DIAGNOSIS — I2510 Atherosclerotic heart disease of native coronary artery without angina pectoris: Secondary | ICD-10-CM | POA: Diagnosis not present

## 2021-05-22 DIAGNOSIS — I519 Heart disease, unspecified: Secondary | ICD-10-CM | POA: Diagnosis not present

## 2021-05-22 DIAGNOSIS — E119 Type 2 diabetes mellitus without complications: Secondary | ICD-10-CM | POA: Diagnosis not present

## 2021-05-22 DIAGNOSIS — I10 Essential (primary) hypertension: Secondary | ICD-10-CM | POA: Diagnosis not present

## 2021-05-25 NOTE — Progress Notes (Signed)
CARDIOLOGY CONSULT NOTE       Patient ID: James [name] MRN: [id number] DOB/AGE: 1961-01-30 60 y.o.   Referring Physician: Legrand Rams Primary Physician: Rosita Fire, MD Primary Cardiologist: Johnsie Cancel Reason for Consultation: CHF/HTN  Active Problems:   * No active hospital problems. *   HPI:  60 y.o. referred by Dr Legrand Rams 04/18/21 for HTN and CHF. He has not had close cardiology f/u Use to see Dr Raliegh Ip and Dr Lattie Haw. History of HTN, smoking, OSA HLD. Cath 2013 no CAD non ischemic DCM EF 45% Dilated aortic root 4.6 cm by TTE. Echo 09/02/14 showed EF 45-50% trivial MR/TR AV was tri leaflet He has been maintained on coreg 25 mg bid imdur 120 mg daily , Lasix 40 mg Lisinipril 40 mg daily and aldactone 25 mg daily   He is married Works at [employer] for > 30 years. Has an 94 yo step son Like to hunt/fish works garden and yard Functional class one  Has not been taking coreg and had relative tachycardia and PVC on ECG today   Discussed smoking cessation <10 minutes  Discussed lung cancer screening CT  TTE 04/20/21 EF 25% mild MR low normal RV ARB d/c and started on Entresto 24/26.  04/21/21   Discussed doing cardiac CTA to r/o CAD and size aorta   He is doing well functional class one with no side effects from Entresto BP only [id number] systolic today will not titrate dose    ROS All other systems reviewed and negative except as noted above  Past Medical History:  Diagnosis Date   Arteriosclerotic cardiovascular disease (ASCVD)    Insignificant disease in 2002 and 2013   Cardiomyopathy ([place])    CHF and EF of 25% in 02/2012; normal TSH   Diabetes mellitus without complication (HCC)    Hypercholesteremia    Hypertension    Myocardial infarction ([place])    2000 MI   Obstructive sleep apnea    was positive but never given a CPAP   Tobacco abuse     Family History  Problem Relation Age of Onset   Heart attack Father    Hypertension Father    Iron deficiency Father    Coronary artery  disease Mother    Heart disease Maternal Grandfather        Also paternal grandfather   Cardiomyopathy Brother        No significant coronary disease by coronary angiography   Thyroid disease Sister    Sudden death Other    Colon cancer Neg Hx     Social History   Socioeconomic History   Marital status: Married    Spouse name: Not on file   Number of children: Not on file   Years of education: Not on file   Highest education level: Not on file  Occupational History   Occupation: Full time, [employer]  Tobacco Use   Smoking status: Every Day    Packs/day: 0.50    Years: 32.00    Pack years: 16.00    Types: Cigarettes    Start date: 10/15/1982   Smokeless tobacco: Never   Tobacco comments:    3 cigaretts a day  Vaping Use   Vaping Use: Never used  Substance and Sexual Activity   Alcohol use: No    Alcohol/week: 0.0 standard drinks   Drug use: No   Sexual activity: Yes    Birth control/protection: None  Other Topics Concern   Not on file  Social History Narrative  Divorced   No regular exercise   Social Determinants of Health   Financial Resource Strain: Not on file  Food Insecurity: Not on file  Transportation Needs: Not on file  Physical Activity: Not on file  Stress: Not on file  Social Connections: Not on file  Intimate Partner Violence: Not on file    Past Surgical History:  Procedure Laterality Date   CARDIAC CATHETERIZATION  2002   COLONOSCOPY N/A 03/22/2014   Procedure: COLONOSCOPY;  Surgeon: Danie Binder, MD;  [facility];  Service: Endoscopy;  Laterality: N/A;  11:15 AM   INGUINAL HERNIA REPAIR Right 06/24/2017   Procedure: HERNIA REPAIR INGUINAL ADULT WITH MESH;  Surgeon: Aviva Signs, MD;  Location: [facility];  Service: General;  Laterality: Right;      Current Outpatient Medications:    aspirin 81 MG tablet, Take 81 mg by mouth daily., Disp: , Rfl:    carvedilol (COREG) 6.25 MG tablet, Take 1 tablet (6.25 mg total) by mouth 2 (two)  times daily., Disp: 180 tablet, Rfl: 3   furosemide (LASIX) 40 MG tablet, TAKE 1 TABLET (40 MG TOTAL) BY MOUTH EVERY MORNING. (Patient taking differently: Take 40 mg by mouth daily.), Disp: 30 tablet, Rfl: 6   isosorbide mononitrate (IMDUR) 120 MG 24 hr tablet, TAKE 1 TABLET BY MOUTH EVERY DAY, Disp: 30 tablet, Rfl: 1   metFORMIN (GLUCOPHAGE) 500 MG tablet, Take 500 mg by mouth 2 (two) times daily with a meal. , Disp: , Rfl:    pantoprazole (PROTONIX) 40 MG tablet, TAKE 1 TABLET (40 MG TOTAL) BY MOUTH DAILY. (Patient taking differently: Take 40 mg by mouth daily.), Disp: 30 tablet, Rfl: 9   promethazine-dextromethorphan (PROMETHAZINE-DM) 6.25-15 MG/5ML syrup, Take 5 mLs by mouth 4 (four) times daily as needed for cough., Disp: 118 mL, Rfl: 0   sacubitril-valsartan (ENTRESTO) 24-26 MG, Take 1 tablet by mouth 2 (two) times daily., Disp: 60 tablet, Rfl: 11   spironolactone (ALDACTONE) 25 MG tablet, TAKE 1 TABLET (25 MG TOTAL) BY MOUTH DAILY. (Patient taking differently: Take 25 mg by mouth daily.), Disp: 30 tablet, Rfl: 1    Physical Exam: There were no vitals taken for this visit.    Affect appropriate Healthy:  appears stated age 30: normal Neck supple with no adenopathy JVP normal no bruits no thyromegaly Lungs clear with no wheezing and good diaphragmatic motion Heart:  S1/S2 no murmur, no rub, gallop or click PMI normal Abdomen: benighn, BS positve, no tenderness, no AAA no bruit.  No HSM or HJR left inguinal hernia  Distal pulses intact with no bruits No edema Neuro non-focal Skin warm and dry No muscular weakness   Labs:   Lab Results  Component Value Date   WBC 9.2 12/10/2020   HGB 15.2 12/10/2020   HCT 45.2 12/10/2020   MCV 94.8 12/10/2020   PLT 176 12/10/2020   No results for input(s): NA, K, CL, CO2, BUN, CREATININE, CALCIUM, PROT, BILITOT, ALKPHOS, ALT, AST, GLUCOSE in the last 168 hours.  Invalid input(s): LABALBU Lab Results  Component Value Date   CKTOTAL  141 07/26/2010   CKMB 2.4 07/26/2010   TROPONINI <0.03 11/25/2016    Lab Results  Component Value Date   CHOL 190 10/31/2012   CHOL  07/26/2010    182        ATP III CLASSIFICATION:  <200     mg/dL   Desirable  200-239  mg/dL   Borderline High  >=240    mg/dL  High          Lab Results  Component Value Date   HDL 38 (L) 10/31/2012   HDL 39 (L) 07/26/2010   Lab Results  Component Value Date   LDLCALC 123 (H) 10/31/2012   LDLCALC (H) 07/26/2010    121        Total Cholesterol/HDL:CHD Risk Coronary Heart Disease Risk Table                     Men   Women  1/2 Average Risk   3.4   3.3  Average Risk       5.0   4.4  2 X Average Risk   9.6   7.1  3 X Average Risk  23.4   11.0        Use the calculated Patient Ratio above and the CHD Risk Table to determine the patient's CHD Risk.        ATP III CLASSIFICATION (LDL):  <100     mg/dL   Optimal  100-129  mg/dL   Near or Above                    Optimal  130-159  mg/dL   Borderline  160-189  mg/dL   High  >190     mg/dL   Very High   Lab Results  Component Value Date   TRIG 143 10/31/2012   TRIG 111 07/26/2010   Lab Results  Component Value Date   CHOLHDL 5.0 10/31/2012   CHOLHDL 4.7 07/26/2010   No results found for: LDLDIRECT    Radiology: No results found.  EKG: SR LAFB PVC IVCD  12/12/20    ASSESSMENT AND PLAN:   DCM:  historically non ischemic. Most recent EF TTE 04/21/19-25%  Now on Entresto in addition to coreg, aldactone, imdur, lasix Cardiac CTA to r/o CAD Smoking/COPD:  counseled on cessation < 10 minutes CXR 12/11/20 no lesions see below  HTN;  well controlled  Dilated aortic root. In setting of tri leaflet AV Given worsening DCM, smoking and need to r/o CAD will order cardiac CTA to evaluate lungs, aorta and r/o CAD  HLD:  continue statin labs with primary  DM:  Discussed low carb diet.  Target hemoglobin A1c is 6.5 or less.  Continue current medications. OSA:  encouraged CPAP use    Cardiac  CTA BMET  F/U 6 months    Signed: Jenkins Rouge 06/02/2021, 10:27 AM

## 2021-06-02 ENCOUNTER — Encounter: Payer: Self-pay | Admitting: Cardiovascular Disease

## 2021-06-02 ENCOUNTER — Other Ambulatory Visit (HOSPITAL_COMMUNITY)
Admission: RE | Admit: 2021-06-02 | Discharge: 2021-06-02 | Disposition: A | Discharge status: Home / Self Care | Payer: BC Managed Care – PPO | Source: Ambulatory Visit | Attending: Cardiovascular Disease | Admitting: Cardiovascular Disease

## 2021-06-02 ENCOUNTER — Other Ambulatory Visit: Payer: Self-pay

## 2021-06-02 ENCOUNTER — Ambulatory Visit (INDEPENDENT_AMBULATORY_CARE_PROVIDER_SITE_OTHER): Payer: BC Managed Care – PPO | Admitting: Cardiovascular Disease

## 2021-06-02 VITALS — BP 114/64 | HR 61 | Ht 78.0 in | Wt 259.0 lb

## 2021-06-02 DIAGNOSIS — I428 Other cardiomyopathies: Secondary | ICD-10-CM

## 2021-06-02 DIAGNOSIS — I10 Essential (primary) hypertension: Secondary | ICD-10-CM | POA: Diagnosis not present

## 2021-06-02 LAB — BASIC METABOLIC PANEL
Anion gap: 3 — ABNORMAL LOW (ref 5–15)
BUN: 22 mg/dL — ABNORMAL HIGH (ref 6–20)
CO2: 29 mmol/L (ref 22–32)
Calcium: 8.5 mg/dL — ABNORMAL LOW (ref 8.9–10.3)
Chloride: 102 mmol/L (ref 98–111)
Creatinine, Ser: 1.31 mg/dL — ABNORMAL HIGH (ref 0.61–1.24)
GFR, Estimated: >60 mL/min (ref >60)
Glucose, Bld: 123 mg/dL — ABNORMAL HIGH (ref 70–99)
Potassium: 3.9 mmol/L (ref 3.5–5.1)
Sodium: 134 mmol/L — ABNORMAL LOW (ref 135–145)

## 2021-06-02 NOTE — Patient Instructions (Signed)
Medication Instructions:  Your physician recommends that you continue on your current medications as directed. Please refer to the Current Medication list given to you today.  *If you need a refill on your cardiac medications before your next appointment, please call your pharmacy*   Lab Work: Your physician recommends that you return for lab work in: Today   If you have labs (blood work) drawn today and your tests are completely normal, you will receive your results only by: MyChart Message (if you have MyChart) OR A paper copy in the mail If you have any lab test that is abnormal or we need to change your treatment, we will call you to review the results.   Testing/Procedures: Cardiac CT    Follow-Up: At [facility], you and your health needs are our priority.  As part of our continuing mission to provide you with exceptional heart care, we have created designated Provider Care Teams.  These Care Teams include your primary Cardiologist (physician) and Advanced Practice Providers (APPs -  Physician Assistants and Nurse Practitioners) who all work together to provide you with the care you need, when you need it.  We recommend signing up for the patient portal called "MyChart".  Sign up information is provided on this After Visit Summary.  MyChart is used to connect with patients for Virtual Visits (Telemedicine).  Patients are able to view lab/test results, encounter notes, upcoming appointments, etc.  Non-urgent messages can be sent to your provider as well.   To learn more about what you can do with MyChart, go to [email or web address].    Your next appointment:   6 month(s)  The format for your next appointment:   In Person  Provider:   Dr. Johnsie Cancel    Other Instructions Thank you for choosing [facility]!     Your cardiac CT will be scheduled at one of the below locations:   [facility] [address] [place], [place] [address] [phone]  [place] [address] [place],  [address] [phone]  If scheduled at [facility], please arrive at the [facility] main entrance (entrance A) of [facility] 30 minutes prior to test start time. Proceed to the [facility] Radiology Department (first floor) to check-in and test prep.  If scheduled at [facility], please arrive 15 mins early for check-in and test prep.  Please follow these instructions carefully (unless otherwise directed):  Hold all erectile dysfunction medications at least 3 days (72 hrs) prior to test.  On the Night Before the Test: Be sure to Drink plenty of water. Do not consume any caffeinated/decaffeinated beverages or chocolate 12 hours prior to your test. Do not take any antihistamines 12 hours prior to your test.  On the Day of the Test: Drink plenty of water until 1 hour prior to the test. Do not eat any food 4 hours prior to the test. You may take your regular medications prior to the test.  Take metoprolol (Lopressor) two hours prior to test. HOLD Furosemide/Hydrochlorothiazide morning of the test.   *For Clinical Staff only. Please instruct patient the following:* Heart Rate Medication Recommendations for Cardiac CT  Resting HR < 50 bpm  No medication  Resting HR 50-60 bpm and BP >110/50 mmHG   Consider Metoprolol tartrate 25 mg PO 90-120 min prior to scan  Resting HR 60-65 bpm and BP >110/50 mmHG  Metoprolol tartrate 50 mg PO 90-120  minutes prior to scan   Resting HR > 65 bpm and BP >110/50 mmHG  Metoprolol tartrate 100 mg PO 90-120 minutes prior to scan  Consider Ivabradine 10-15 mg PO or a calcium channel blocker for resting HR >60 bpm and contraindication to metoprolol tartrate  Consider Ivabradine 10-15 mg PO in combination with metoprolol tartrate for HR >80 bpm         After the Test: Drink plenty of water. After receiving  IV contrast, you may experience a mild flushed feeling. This is normal. On occasion, you may experience a mild rash up to 24 hours after the test. This is not dangerous. If this occurs, you can take Benadryl 25 mg and increase your fluid intake. If you experience trouble breathing, this can be serious. If it is severe call 911 IMMEDIATELY. If it is mild, please call our office. If you take any of these medications: Glipizide/Metformin, Avandament, Glucavance, please do not take 48 hours after completing test unless otherwise instructed.  Please allow 2-4 weeks for scheduling of routine cardiac CTs. Some insurance companies require a pre-authorization which may delay scheduling of this test.   For non-scheduling related questions, please contact the cardiac imaging nurse navigator should you have any questions/concerns: Marchia Bond, Cardiac Imaging Nurse Navigator Gordy Clement, Cardiac Imaging Nurse Navigator  Heart and Vascular Services Direct Office Dial: [phone]   For scheduling needs, including cancellations and rescheduling, please call [place], [phone].

## 2021-06-14 ENCOUNTER — Telehealth (HOSPITAL_COMMUNITY): Payer: Self-pay | Admitting: *Deleted

## 2021-06-14 NOTE — Telephone Encounter (Signed)
Reaching out to patient to offer assistance regarding upcoming cardiac imaging study; pt verbalizes understanding of appt date/time, parking situation and where to check in, pre-test NPO status and medications ordered, and verified current allergies; name and call back number provided for further questions should they arise  Gordy Clement RN Navigator Cardiac Imaging [facility] [phone] office [phone] cell  Patient to take AM dose of carvedilol two hours prior to cardiac CT.

## 2021-06-16 ENCOUNTER — Encounter (HOSPITAL_COMMUNITY): Payer: Self-pay

## 2021-06-16 ENCOUNTER — Other Ambulatory Visit: Payer: Self-pay

## 2021-06-16 ENCOUNTER — Ambulatory Visit (HOSPITAL_COMMUNITY)
Admission: RE | Admit: 2021-06-16 | Discharge: 2021-06-16 | Disposition: A | Discharge status: Home / Self Care | Payer: BC Managed Care – PPO | Source: Ambulatory Visit | Attending: Cardiovascular Disease | Admitting: Cardiovascular Disease

## 2021-06-16 DIAGNOSIS — I428 Other cardiomyopathies: Secondary | ICD-10-CM

## 2021-06-16 MED ORDER — METOPROLOL TARTRATE 5 MG/5ML IV SOLN
INTRAVENOUS | Status: AC
  Filled 2021-06-16: qty 5

## 2021-06-16 MED ORDER — NITROGLYCERIN 0.4 MG SL SUBL
SUBLINGUAL_TABLET | SUBLINGUAL | Status: AC
  Filled 2021-06-16: qty 2

## 2021-06-16 MED ORDER — NITROGLYCERIN 0.4 MG SL SUBL: 0.8000 mg | SUBLINGUAL_TABLET | Freq: Once | SUBLINGUAL | Status: DC

## 2021-06-16 NOTE — Progress Notes (Signed)
Patient at radiology department for cardiac CT scan. Patient heart rate 67-70 on arrival. On CT table patient heart rate 77-79 with frequent PVC's. IV Metoprolol given and PVCs still occurring after medication administration. Dr. Margaretann Loveless informed of PVC's and decision made to cancel scan. Patient and wife informed of reason of cancellation and state understanding. Patient informed that ordering cardiologist, Dr. Johnsie Cancel will be informed of cancellation. CT heart navigator nurse made aware.

## 2021-06-20 ENCOUNTER — Telehealth: Payer: Self-pay | Admitting: Cardiovascular Disease

## 2021-06-20 DIAGNOSIS — I420 Dilated cardiomyopathy: Secondary | ICD-10-CM

## 2021-06-20 NOTE — Telephone Encounter (Signed)
Order an exercise myovue to r/o CAD instead diagnosis CHF dilated cardiomyopathy  -PN   Called and spoke to wife of pt who verbalized understanding.

## 2021-06-20 NOTE — Telephone Encounter (Signed)
New message    Patient wife called and wants to know what the next step is ? Patient could not have the cardiac ct done on Friday because of irregular heartbeat .

## 2021-07-03 ENCOUNTER — Ambulatory Visit (HOSPITAL_COMMUNITY)
Admission: RE | Admit: 2021-07-03 | Discharge: 2021-07-03 | Disposition: A | Discharge status: Home / Self Care | Payer: BC Managed Care – PPO | Source: Ambulatory Visit | Attending: Cardiovascular Disease | Admitting: Cardiovascular Disease

## 2021-07-03 ENCOUNTER — Encounter (HOSPITAL_BASED_OUTPATIENT_CLINIC_OR_DEPARTMENT_OTHER)
Admission: RE | Admit: 2021-07-03 | Discharge: 2021-07-03 | Disposition: A | Discharge status: Home / Self Care | Payer: BC Managed Care – PPO | Source: Ambulatory Visit | Attending: Cardiovascular Disease | Admitting: Cardiovascular Disease

## 2021-07-03 ENCOUNTER — Other Ambulatory Visit: Payer: Self-pay

## 2021-07-03 ENCOUNTER — Encounter (HOSPITAL_COMMUNITY): Payer: Self-pay

## 2021-07-03 DIAGNOSIS — I420 Dilated cardiomyopathy: Secondary | ICD-10-CM | POA: Diagnosis not present

## 2021-07-03 LAB — NM MYOCAR MULTI W/SPECT W/WALL MOTION / EF
Angina Index: 0
Duke Treadmill Score: 2
Estimated workload: 4.6
Exercise duration (min): 2 min
Exercise duration (sec): 10 s
LV dias vol: 323 mL (ref 62–150)
LV sys vol: 252 mL
MPHR: 160 {beats}/min
Nuc Stress EF: 22 %
Peak HR: 125 {beats}/min
Percent HR: 78 %
RATE: 0.4
Rest HR: 85 {beats}/min
Rest Nuclear Isotope Dose: 10.6 mCi
SDS: 0
SRS: 16
SSS: 16
ST Depression (mm): 0 mm
Stress Nuclear Isotope Dose: 32.1 mCi
TID: 0.91

## 2021-07-03 MED ORDER — REGADENOSON 0.4 MG/5ML IV SOLN
INTRAVENOUS | Status: AC
  Administered 2021-07-03: 0.4 mg via INTRAVENOUS
  Filled 2021-07-03: qty 5

## 2021-07-03 MED ORDER — TECHNETIUM TC 99M TETROFOSMIN IV KIT
10.0000 | PACK | Freq: Once | INTRAVENOUS | Status: AC | PRN
  Administered 2021-07-03: 10.6 via INTRAVENOUS

## 2021-07-03 MED ORDER — SODIUM CHLORIDE FLUSH 0.9 % IV SOLN
INTRAVENOUS | Status: AC
  Administered 2021-07-03: 10 mL via INTRAVENOUS
  Filled 2021-07-03: qty 10

## 2021-07-03 MED ORDER — TECHNETIUM TC 99M TETROFOSMIN IV KIT
30.0000 | PACK | Freq: Once | INTRAVENOUS | Status: AC | PRN
  Administered 2021-07-03: 32.1 via INTRAVENOUS

## 2021-07-10 ENCOUNTER — Telehealth: Payer: Self-pay | Admitting: Cardiovascular Disease

## 2021-07-10 NOTE — Telephone Encounter (Signed)
Patient calling for stress test result

## 2021-07-11 NOTE — Telephone Encounter (Signed)
Wife called back, she is aware of apt on Friday, 9/30, arrive at 1:15 pm

## 2021-07-11 NOTE — Telephone Encounter (Signed)
Returned call to [employer], had to leave message. Patient phone says no vm set up.    Needs apt with Dr.Nishan to discuss need for l/r heart cath ( had abn nuc) , apt made for this Friday, 9/30 at 1:30 pm.

## 2021-07-12 NOTE — H&P (View-Only) (Signed)
CARDIOLOGY CONSULT NOTE       Patient ID: James [name] MRN: [id number] DOB/AGE: 11-27-1960 60 y.o.   Referring Physician: Legrand Rams Primary Physician: Rosita Fire, MD Primary Cardiologist: Johnsie Cancel Reason for Consultation: CHF/HTN  Active Problems:   * No active hospital problems. *   HPI:  60 y.o. referred by Dr Legrand Rams 04/18/21 for HTN and CHF. He has not had close cardiology f/u Use to see Dr Raliegh Ip and Dr Lattie Haw. History of HTN, smoking, OSA HLD. Cath 2013 no CAD non ischemic DCM EF 45% Dilated aortic root 4.6 cm by TTE. Echo 09/02/14 showed EF 45-50% trivial MR/TR AV was tri leaflet He has been maintained on coreg 25 mg bid imdur 120 mg daily , Lasix 40 mg Lisinipril 40 mg daily and aldactone 25 mg daily   He is married Works at [employer] for > 30 years. Has an 56 yo step son Like to hunt/fish works garden and yard Functional class one  Has not been taking coreg and had relative tachycardia and PVC on ECG today   Discussed smoking cessation <10 minutes  Discussed lung cancer screening CT  TTE 04/20/21 EF 25% mild MR low normal RV ARB d/c and started on Entresto 24/26.  04/21/21   He is doing well functional class one with no side effects from Entresto BP only 423 systolic today will not titrate dose   Nuclear study 07/03/21 reviewed with EF estimated 22% and anteroseptal, inferolateral MI no ischemia Given evidence of CAD and severe LV dysfunction patient here to discuss right and left heart cath Risks including stroke, bleeding, intubation contrast reaction and emergency surgery discussed willing to proceed   He has had some generalized fatigue and malaise but no chest pain    ROS All other systems reviewed and negative except as noted above  Past Medical History:  Diagnosis Date   Arteriosclerotic cardiovascular disease (ASCVD)    Insignificant disease in 2002 and 2013   Cardiomyopathy ([place])    CHF and EF of 25% in 02/2012; normal TSH   Diabetes mellitus without  complication ([place])    Hypercholesteremia    Hypertension    Myocardial infarction ([place])    2000 MI   Obstructive sleep apnea    was positive but never given a CPAP   Tobacco abuse     Family History  Problem Relation Age of Onset   Heart attack Father    Hypertension Father    Iron deficiency Father    Coronary artery disease Mother    Heart disease Maternal Grandfather        Also paternal grandfather   Cardiomyopathy Brother        No significant coronary disease by coronary angiography   Thyroid disease Sister    Sudden death Other    Colon cancer Neg Hx     Social History   Socioeconomic History   Marital status: Married    Spouse name: Not on file   Number of children: Not on file   Years of education: Not on file   Highest education level: Not on file  Occupational History   Occupation: Full time, [employer]  Tobacco Use   Smoking status: Every Day    Packs/day: 0.50    Years: 32.00    Pack years: 16.00    Types: Cigarettes    Start date: 10/15/1982   Smokeless tobacco: Never   Tobacco comments:    3 cigaretts a day  Vaping Use   Vaping Use:  Never used  Substance and Sexual Activity   Alcohol use: No    Alcohol/week: 0.0 standard drinks   Drug use: No   Sexual activity: Yes    Birth control/protection: None  Other Topics Concern   Not on file  Social History Narrative   Divorced   No regular exercise   Social Determinants of Health   Financial Resource Strain: Not on file  Food Insecurity: Not on file  Transportation Needs: Not on file  Physical Activity: Not on file  Stress: Not on file  Social Connections: Not on file  Intimate Partner Violence: Not on file    Past Surgical History:  Procedure Laterality Date   CARDIAC CATHETERIZATION  2002   COLONOSCOPY N/A 03/22/2014   Procedure: COLONOSCOPY;  Surgeon: Danie Binder, MD;  [facility];  Service: Endoscopy;  Laterality: N/A;  11:15 AM   INGUINAL HERNIA REPAIR Right 06/24/2017    Procedure: HERNIA REPAIR INGUINAL ADULT WITH MESH;  Surgeon: Aviva Signs, MD;  Location: [facility];  Service: General;  Laterality: Right;      Current Outpatient Medications:    aspirin 81 MG tablet, Take 81 mg by mouth daily., Disp: , Rfl:    carvedilol (COREG) 6.25 MG tablet, Take 1 tablet (6.25 mg total) by mouth 2 (two) times daily., Disp: 180 tablet, Rfl: 3   furosemide (LASIX) 40 MG tablet, TAKE 1 TABLET (40 MG TOTAL) BY MOUTH EVERY MORNING. (Patient taking differently: Take 40 mg by mouth daily.), Disp: 30 tablet, Rfl: 6   isosorbide mononitrate (IMDUR) 120 MG 24 hr tablet, TAKE 1 TABLET BY MOUTH EVERY DAY, Disp: 30 tablet, Rfl: 1   metFORMIN (GLUCOPHAGE) 500 MG tablet, Take 500 mg by mouth 2 (two) times daily with a meal. , Disp: , Rfl:    pantoprazole (PROTONIX) 40 MG tablet, TAKE 1 TABLET (40 MG TOTAL) BY MOUTH DAILY. (Patient taking differently: Take 40 mg by mouth daily.), Disp: 30 tablet, Rfl: 9   sacubitril-valsartan (ENTRESTO) 24-26 MG, Take 1 tablet by mouth 2 (two) times daily., Disp: 60 tablet, Rfl: 11   spironolactone (ALDACTONE) 25 MG tablet, TAKE 1 TABLET (25 MG TOTAL) BY MOUTH DAILY. (Patient taking differently: Take 25 mg by mouth daily.), Disp: 30 tablet, Rfl: 1   promethazine-dextromethorphan (PROMETHAZINE-DM) 6.25-15 MG/5ML syrup, Take 5 mLs by mouth 4 (four) times daily as needed for cough. (Patient not taking: No sig reported), Disp: 118 mL, Rfl: 0    Physical Exam: Blood pressure 114/68, pulse (!) 56, height 6\' 6"  (1.981 m), weight 126.6 kg, SpO2 97 %.    Affect appropriate Healthy:  appears stated age 60: normal Neck supple with no adenopathy JVP normal no bruits no thyromegaly Lungs clear with no wheezing and good diaphragmatic motion Heart:  S1/S2 no murmur, no rub, gallop or click PMI normal Abdomen: benighn, BS positve, no tenderness, no AAA no bruit.  No HSM or HJR left inguinal hernia  Distal pulses intact with no bruits No edema Neuro  non-focal Skin warm and dry No muscular weakness   Labs:   Lab Results  Component Value Date   WBC 9.2 12/10/2020   HGB 15.2 12/10/2020   HCT 45.2 12/10/2020   MCV 94.8 12/10/2020   PLT 176 12/10/2020   No results for input(s): NA, K, CL, CO2, BUN, CREATININE, CALCIUM, PROT, BILITOT, ALKPHOS, ALT, AST, GLUCOSE in the last 168 hours.  Invalid input(s): LABALBU Lab Results  Component Value Date   CKTOTAL 141 07/26/2010  CKMB 2.4 07/26/2010   TROPONINI <0.03 11/25/2016    Lab Results  Component Value Date   CHOL 190 10/31/2012   CHOL  07/26/2010    182        ATP III CLASSIFICATION:  <200     mg/dL   Desirable  200-239  mg/dL   Borderline High  >=240    mg/dL   High          Lab Results  Component Value Date   HDL 38 (L) 10/31/2012   HDL 39 (L) 07/26/2010   Lab Results  Component Value Date   LDLCALC 123 (H) 10/31/2012   LDLCALC (H) 07/26/2010    121        Total Cholesterol/HDL:CHD Risk Coronary Heart Disease Risk Table                     Men   Women  1/2 Average Risk   3.4   3.3  Average Risk       5.0   4.4  2 X Average Risk   9.6   7.1  3 X Average Risk  23.4   11.0        Use the calculated Patient Ratio above and the CHD Risk Table to determine the patient's CHD Risk.        ATP III CLASSIFICATION (LDL):  <100     mg/dL   Optimal  100-129  mg/dL   Near or Above                    Optimal  130-159  mg/dL   Borderline  160-189  mg/dL   High  >190     mg/dL   Very High   Lab Results  Component Value Date   TRIG 143 10/31/2012   TRIG 111 07/26/2010   Lab Results  Component Value Date   CHOLHDL 5.0 10/31/2012   CHOLHDL 4.7 07/26/2010   No results found for: LDLDIRECT    Radiology: NM Myocar Multi W/Spect W/Wall Motion / EF  Result Date: 07/03/2021   Findings are consistent with prior anteroseptal and inferolateral myocardial infarction. There is no current ischemia. The study is high risk. Risk based on scar and decreased LVEF, consider  correlating LVEF with echo.   No ST deviation was noted. Converted to pharmacological stress test due to frequent ventricular ectopy with exercise. PVCs, bigeminy, and accelerated idioventricular rhythm noted.   LV perfusion is abnormal.   Left ventricular function is abnormal. Nuclear stress EF: 22 %. The left ventricular ejection fraction is severely decreased (<30%). End systolic cavity size is severely enlarged.    EKG: SR LAFB PVC IVCD  12/12/20    ASSESSMENT AND PLAN:   DCM:  historically non ischemic but recent myovue suggests ischemic DCM  Most recent EF TTE 04/21/19-25%  Now on Entresto in addition to coreg, aldactone, imdur, lasix right and left cath to further define anatomy and guide Rx  Smoking/COPD:  counseled on cessation < 10 minutes CXR 12/11/20 no lesions see below  HTN;  well controlled  Dilated aortic root. In setting of tri leaflet AV follow  DM:  Discussed low carb diet.  Target hemoglobin A1c is 6.5 or less.  Continue current medications. OSA:  encouraged CPAP use    Right and left cath  Pre cath labs   F/U  post cath   Signed: Jenkins Rouge 07/14/2021, 1:47 PM

## 2021-07-12 NOTE — Progress Notes (Signed)
CARDIOLOGY CONSULT NOTE       Patient ID: James [name] MRN: [id number] DOB/AGE: 1961-01-08 60 y.o.   Referring Physician: Legrand Rams Primary Physician: Rosita Fire, MD Primary Cardiologist: Johnsie Cancel Reason for Consultation: CHF/HTN  Active Problems:   * No active hospital problems. *   HPI:  60 y.o. referred by Dr Legrand Rams 04/18/21 for HTN and CHF. He has not had close cardiology f/u Use to see Dr Raliegh Ip and Dr Lattie Haw. History of HTN, smoking, OSA HLD. Cath 2013 no CAD non ischemic DCM EF 45% Dilated aortic root 4.6 cm by TTE. Echo 09/02/14 showed EF 45-50% trivial MR/TR AV was tri leaflet He has been maintained on coreg 25 mg bid imdur 120 mg daily , Lasix 40 mg Lisinipril 40 mg daily and aldactone 25 mg daily   He is married Works at [employer] for > 30 years. Has an 50 yo step son Like to hunt/fish works garden and yard Functional class one  Has not been taking coreg and had relative tachycardia and PVC on ECG today   Discussed smoking cessation <10 minutes  Discussed lung cancer screening CT  TTE 04/20/21 EF 25% mild MR low normal RV ARB d/c and started on Entresto 24/26.  04/21/21   He is doing well functional class one with no side effects from Entresto BP only 370 systolic today will not titrate dose   Nuclear study 07/03/21 reviewed with EF estimated 22% and anteroseptal, inferolateral MI no ischemia Given evidence of CAD and severe LV dysfunction patient here to discuss right and left heart cath Risks including stroke, bleeding, intubation contrast reaction and emergency surgery discussed willing to proceed   He has had some generalized fatigue and malaise but no chest pain    ROS All other systems reviewed and negative except as noted above  Past Medical History:  Diagnosis Date   Arteriosclerotic cardiovascular disease (ASCVD)    Insignificant disease in 2002 and 2013   Cardiomyopathy ([place])    CHF and EF of 25% in 02/2012; normal TSH   Diabetes mellitus without  complication ([place])    Hypercholesteremia    Hypertension    Myocardial infarction ([place])    2000 MI   Obstructive sleep apnea    was positive but never given a CPAP   Tobacco abuse     Family History  Problem Relation Age of Onset   Heart attack Father    Hypertension Father    Iron deficiency Father    Coronary artery disease Mother    Heart disease Maternal Grandfather        Also paternal grandfather   Cardiomyopathy Brother        No significant coronary disease by coronary angiography   Thyroid disease Sister    Sudden death Other    Colon cancer Neg Hx     Social History   Socioeconomic History   Marital status: Married    Spouse name: Not on file   Number of children: Not on file   Years of education: Not on file   Highest education level: Not on file  Occupational History   Occupation: Full time, [employer]  Tobacco Use   Smoking status: Every Day    Packs/day: 0.50    Years: 32.00    Pack years: 16.00    Types: Cigarettes    Start date: 10/15/1982   Smokeless tobacco: Never   Tobacco comments:    3 cigaretts a day  Vaping Use   Vaping Use:  Never used  Substance and Sexual Activity   Alcohol use: No    Alcohol/week: 0.0 standard drinks   Drug use: No   Sexual activity: Yes    Birth control/protection: None  Other Topics Concern   Not on file  Social History Narrative   Divorced   No regular exercise   Social Determinants of Health   Financial Resource Strain: Not on file  Food Insecurity: Not on file  Transportation Needs: Not on file  Physical Activity: Not on file  Stress: Not on file  Social Connections: Not on file  Intimate Partner Violence: Not on file    Past Surgical History:  Procedure Laterality Date   CARDIAC CATHETERIZATION  2002   COLONOSCOPY N/A 03/22/2014   Procedure: COLONOSCOPY;  Surgeon: Danie Binder, MD;  Location: [facility];  Service: Endoscopy;  Laterality: N/A;  11:15 AM   INGUINAL HERNIA REPAIR Right 06/24/2017    Procedure: HERNIA REPAIR INGUINAL ADULT WITH MESH;  Surgeon: Aviva Signs, MD;  Location: [facility] ORS;  Service: General;  Laterality: Right;      Current Outpatient Medications:    aspirin 81 MG tablet, Take 81 mg by mouth daily., Disp: , Rfl:    carvedilol (COREG) 6.25 MG tablet, Take 1 tablet (6.25 mg total) by mouth 2 (two) times daily., Disp: 180 tablet, Rfl: 3   furosemide (LASIX) 40 MG tablet, TAKE 1 TABLET (40 MG TOTAL) BY MOUTH EVERY MORNING. (Patient taking differently: Take 40 mg by mouth daily.), Disp: 30 tablet, Rfl: 6   isosorbide mononitrate (IMDUR) 120 MG 24 hr tablet, TAKE 1 TABLET BY MOUTH EVERY DAY, Disp: 30 tablet, Rfl: 1   metFORMIN (GLUCOPHAGE) 500 MG tablet, Take 500 mg by mouth 2 (two) times daily with a meal. , Disp: , Rfl:    pantoprazole (PROTONIX) 40 MG tablet, TAKE 1 TABLET (40 MG TOTAL) BY MOUTH DAILY. (Patient taking differently: Take 40 mg by mouth daily.), Disp: 30 tablet, Rfl: 9   sacubitril-valsartan (ENTRESTO) 24-26 MG, Take 1 tablet by mouth 2 (two) times daily., Disp: 60 tablet, Rfl: 11   spironolactone (ALDACTONE) 25 MG tablet, TAKE 1 TABLET (25 MG TOTAL) BY MOUTH DAILY. (Patient taking differently: Take 25 mg by mouth daily.), Disp: 30 tablet, Rfl: 1   promethazine-dextromethorphan (PROMETHAZINE-DM) 6.25-15 MG/5ML syrup, Take 5 mLs by mouth 4 (four) times daily as needed for cough. (Patient not taking: No sig reported), Disp: 118 mL, Rfl: 0    Physical Exam: Blood pressure 114/68, pulse (!) 56, height 6\' 6"  (1.981 m), weight 126.6 kg, SpO2 97 %.    Affect appropriate Healthy:  appears stated age 60: normal Neck supple with no adenopathy JVP normal no bruits no thyromegaly Lungs clear with no wheezing and good diaphragmatic motion Heart:  S1/S2 no murmur, no rub, gallop or click PMI normal Abdomen: benighn, BS positve, no tenderness, no AAA no bruit.  No HSM or HJR left inguinal hernia  Distal pulses intact with no bruits No edema Neuro  non-focal Skin warm and dry No muscular weakness   Labs:   Lab Results  Component Value Date   WBC 9.2 12/10/2020   HGB 15.2 12/10/2020   HCT 45.2 12/10/2020   MCV 94.8 12/10/2020   PLT 176 12/10/2020   No results for input(s): NA, K, CL, CO2, BUN, CREATININE, CALCIUM, PROT, BILITOT, ALKPHOS, ALT, AST, GLUCOSE in the last 168 hours.  Invalid input(s): LABALBU Lab Results  Component Value Date   CKTOTAL 141 07/26/2010  CKMB 2.4 07/26/2010   TROPONINI <0.03 11/25/2016    Lab Results  Component Value Date   CHOL 190 10/31/2012   CHOL  07/26/2010    182        ATP III CLASSIFICATION:  <200     mg/dL   Desirable  200-239  mg/dL   Borderline High  >=240    mg/dL   High          Lab Results  Component Value Date   HDL 38 (L) 10/31/2012   HDL 39 (L) 07/26/2010   Lab Results  Component Value Date   LDLCALC 123 (H) 10/31/2012   LDLCALC (H) 07/26/2010    121        Total Cholesterol/HDL:CHD Risk Coronary Heart Disease Risk Table                     Men   Women  1/2 Average Risk   3.4   3.3  Average Risk       5.0   4.4  2 X Average Risk   9.6   7.1  3 X Average Risk  23.4   11.0        Use the calculated Patient Ratio above and the CHD Risk Table to determine the patient's CHD Risk.        ATP III CLASSIFICATION (LDL):  <100     mg/dL   Optimal  100-129  mg/dL   Near or Above                    Optimal  130-159  mg/dL   Borderline  160-189  mg/dL   High  >190     mg/dL   Very High   Lab Results  Component Value Date   TRIG 143 10/31/2012   TRIG 111 07/26/2010   Lab Results  Component Value Date   CHOLHDL 5.0 10/31/2012   CHOLHDL 4.7 07/26/2010   No results found for: LDLDIRECT    Radiology: NM Myocar Multi W/Spect W/Wall Motion / EF  Result Date: 07/03/2021   Findings are consistent with prior anteroseptal and inferolateral myocardial infarction. There is no current ischemia. The study is high risk. Risk based on scar and decreased LVEF, consider  correlating LVEF with echo.   No ST deviation was noted. Converted to pharmacological stress test due to frequent ventricular ectopy with exercise. PVCs, bigeminy, and accelerated idioventricular rhythm noted.   LV perfusion is abnormal.   Left ventricular function is abnormal. Nuclear stress EF: 22 %. The left ventricular ejection fraction is severely decreased (<30%). End systolic cavity size is severely enlarged.    EKG: SR LAFB PVC IVCD  12/12/20    ASSESSMENT AND PLAN:   DCM:  historically non ischemic but recent myovue suggests ischemic DCM  Most recent EF TTE 04/21/19-25%  Now on Entresto in addition to coreg, aldactone, imdur, lasix right and left cath to further define anatomy and guide Rx  Smoking/COPD:  counseled on cessation < 10 minutes CXR 12/11/20 no lesions see below  HTN;  well controlled  Dilated aortic root. In setting of tri leaflet AV follow  DM:  Discussed low carb diet.  Target hemoglobin A1c is 6.5 or less.  Continue current medications. OSA:  encouraged CPAP use    Right and left cath  Pre cath labs   F/U  post cath   Signed: Jenkins Rouge 07/14/2021, 1:47 PM

## 2021-07-14 ENCOUNTER — Other Ambulatory Visit: Payer: Self-pay

## 2021-07-14 ENCOUNTER — Encounter: Payer: Self-pay | Admitting: Cardiovascular Disease

## 2021-07-14 ENCOUNTER — Ambulatory Visit (INDEPENDENT_AMBULATORY_CARE_PROVIDER_SITE_OTHER): Payer: BC Managed Care – PPO | Admitting: Cardiovascular Disease

## 2021-07-14 ENCOUNTER — Other Ambulatory Visit (HOSPITAL_COMMUNITY)
Admission: RE | Admit: 2021-07-14 | Discharge: 2021-07-14 | Disposition: A | Discharge status: Home / Self Care | Payer: BC Managed Care – PPO | Source: Ambulatory Visit | Attending: Cardiovascular Disease | Admitting: Cardiovascular Disease

## 2021-07-14 ENCOUNTER — Telehealth: Payer: Self-pay | Admitting: Cardiovascular Disease

## 2021-07-14 ENCOUNTER — Other Ambulatory Visit: Payer: Self-pay | Admitting: Cardiovascular Disease

## 2021-07-14 VITALS — BP 114/68 | HR 56 | Ht 78.0 in | Wt 279.0 lb

## 2021-07-14 DIAGNOSIS — R9439 Abnormal result of other cardiovascular function study: Secondary | ICD-10-CM | POA: Diagnosis not present

## 2021-07-14 DIAGNOSIS — I10 Essential (primary) hypertension: Secondary | ICD-10-CM | POA: Insufficient documentation

## 2021-07-14 DIAGNOSIS — G4733 Obstructive sleep apnea (adult) (pediatric): Secondary | ICD-10-CM | POA: Diagnosis not present

## 2021-07-14 DIAGNOSIS — I420 Dilated cardiomyopathy: Secondary | ICD-10-CM

## 2021-07-14 DIAGNOSIS — F17200 Nicotine dependence, unspecified, uncomplicated: Secondary | ICD-10-CM

## 2021-07-14 LAB — CBC WITH DIFFERENTIAL/PLATELET
Abs Immature Granulocytes: 0.03 10*3/uL (ref 0.00–0.07)
Basophils Absolute: 0 10*3/uL (ref 0.0–0.1)
Basophils Relative: 0 %
Eosinophils Absolute: 0.1 10*3/uL (ref 0.0–0.5)
Eosinophils Relative: 1 %
HCT: 45 % (ref 39.0–52.0)
Hemoglobin: 14.7 g/dL (ref 13.0–17.0)
Immature Granulocytes: 0 %
Lymphocytes Relative: 21 %
Lymphs Abs: 1.8 10*3/uL (ref 0.7–4.0)
MCH: 32 pg (ref 26.0–34.0)
MCHC: 32.7 g/dL (ref 30.0–36.0)
MCV: 97.8 fL (ref 80.0–100.0)
Monocytes Absolute: 0.9 10*3/uL (ref 0.1–1.0)
Monocytes Relative: 10 %
Neutro Abs: 5.7 10*3/uL (ref 1.7–7.7)
Neutrophils Relative %: 68 %
Platelets: 156 10*3/uL (ref 150–400)
RBC: 4.6 MIL/uL (ref 4.22–5.81)
RDW: 14.2 % (ref 11.5–15.5)
WBC: 8.6 10*3/uL (ref 4.0–10.5)
nRBC: 0 % (ref 0.0–0.2)

## 2021-07-14 LAB — BASIC METABOLIC PANEL
Anion gap: 6 (ref 5–15)
BUN: 21 mg/dL — ABNORMAL HIGH (ref 6–20)
CO2: 29 mmol/L (ref 22–32)
Calcium: 8 mg/dL — ABNORMAL LOW (ref 8.9–10.3)
Chloride: 106 mmol/L (ref 98–111)
Creatinine, Ser: 1.46 mg/dL — ABNORMAL HIGH (ref 0.61–1.24)
GFR, Estimated: 55 mL/min — ABNORMAL LOW (ref >60)
Glucose, Bld: 112 mg/dL — ABNORMAL HIGH (ref 70–99)
Potassium: 3.6 mmol/L (ref 3.5–5.1)
Sodium: 141 mmol/L (ref 135–145)

## 2021-07-14 MED ORDER — SODIUM CHLORIDE 0.9% FLUSH: 3.0000 mL | Freq: Two times a day (BID) | INTRAVENOUS | Status: DC

## 2021-07-14 NOTE — Patient Instructions (Signed)
Medication Instructions:  Your physician recommends that you continue on your current medications as directed. Please refer to the Current Medication list given to you today.  *If you need a refill on your cardiac medications before your next appointment, please call your pharmacy*   Lab Work: Your physician recommends that you return for lab work in: Today ( CBC, BMET)   If you have labs (blood work) drawn today and your tests are completely normal, you will receive your results only by: MyChart Message (if you have [place]) OR A paper copy in the mail If you have any lab test that is abnormal or we need to change your treatment, we will call you to review the results.   Testing/Procedures: Your physician has requested that you have a cardiac catheterization. Cardiac catheterization is used to diagnose and/or treat various heart conditions. Doctors may recommend this procedure for a number of different reasons. The most common reason is to evaluate chest pain. Chest pain can be a symptom of coronary artery disease (CAD), and cardiac catheterization can show whether plaque is narrowing or blocking your heart's arteries. This procedure is also used to evaluate the valves, as well as measure the blood flow and oxygen levels in different parts of your heart. For further information please visit [email or web address]. Please follow instruction sheet, as given.    Follow-Up: At [facility], you and your health needs are our priority.  As part of our continuing mission to provide you with exceptional heart care, we have created designated Provider Care Teams.  These Care Teams include your primary Cardiologist (physician) and Advanced Practice Providers (APPs -  Physician Assistants and Nurse Practitioners) who all work together to provide you with the care you need, when you need it.  We recommend signing up for the patient portal called "MyChart".  Sign up information is provided on this After  Visit Summary.  MyChart is used to connect with patients for Virtual Visits (Telemedicine).  Patients are able to view lab/test results, encounter notes, upcoming appointments, etc.  Non-urgent messages can be sent to your provider as well.   To learn more about what you can do with MyChart, go to [email or web address].    Your next appointment:   3 -4 week(s)  The format for your next appointment:   In Person  Provider:   Jenkins Rouge, MD or Bernerd Pho, PA-C   Other Instructions Thank you for choosing [facility]!    [address] [place] [address] Dept: [phone] Loc: [place]  07/14/2021  You are scheduled for a Cardiac Catheterization on Wednesday, October 5 with Dr. Lauree Chandler.  1. Please arrive at the [facility] (Main Entrance A) at [facility]: [address] [place], [place] [address] at 9:30 AM (This time is two hours before your procedure to ensure your preparation). Free valet parking service is available.   Special note: Every effort is made to have your procedure done on time. Please understand that emergencies sometimes delay scheduled procedures.  2. Diet: Do not eat solid foods after midnight.  The patient may have clear liquids until 5am upon the day of the procedure.  3. Labs: You will need to have blood drawn on Friday, September 30 at [address], [place]  Open: 7am - 6pm, Sat 8am - 12 noon   Phone: [phone]. You do not need to be fasting.  4. Medication instructions in preparation for your procedure:   Contrast Allergy: No   Do not take your Lasix the morning of your procedure.   On the morning of your procedure, take your Aspirin and any morning medicines NOT listed above.  You may use sips of water.  5. Plan for one night stay--bring personal belongings. 6. Bring a  current list of your medications and current insurance cards. 7. You MUST have a responsible person to drive you home. 8. Someone MUST be with you the first 24 hours after you arrive home or your discharge will be delayed. 9. Please wear clothes that are easy to get on and off and wear slip-on shoes.  Thank you for allowing [name] to care for you!   --  Invasive Cardiovascular services

## 2021-07-14 NOTE — Telephone Encounter (Signed)
PERCERT:   Rt/ Lt heart cath [facility] Oct 5 @ 11:30

## 2021-07-14 NOTE — Addendum Note (Signed)
Addended by: Levonne Hubert on: 07/14/2021 02:23 PM   Modules accepted: Orders

## 2021-07-18 ENCOUNTER — Telehealth: Payer: Self-pay | Admitting: *Deleted

## 2021-07-18 NOTE — Telephone Encounter (Signed)
**Note De-Identified Sharai Overbay Obfuscation** I called the pt and we discussed pt asst for his Entresto through [employer] pt asst Foundation as if he is approved he will receive Delene Loll free of charge from them. He is interested and requested that I s/w his wife as well.  I explained the above to his wife and provided [employer] pt asst phone number so she can call them with questions concerning their Entresto program and the pts eligibility to be approved for the program. She is aware to request that they mail the pt an application to their home if it appears that he is eligible for approval. She is also aware to complete the pts part of the application once they receive it, obtain required documents per [employer] pt asst foundation, and to bring all to Dr Kyla Balzarine office at [facility] in [place] to drop off.  She verbalized understanding to all information given and thanked me for calling them to discuss.  Forwarding to the [facility] triage pool so they are aware of this.

## 2021-07-18 NOTE — Telephone Encounter (Addendum)
Cardiac catheterization scheduled at [facility] for: Wednesday July 19, 2021 11:30 [place] Entrance A [facility]) at: 9:30 AM   No solid food after midnight prior to cath, clear liquids until 5 AM day of procedure.  Medication instructions: Hold: Metformin-day of procedure and 48 hours Spironolactone-AM of procedure-per protocol GFR 55 Lasix-AM of procedure-per protocol GFR 55 Lisinopril -AM of procedure-per protocol GFR 55  Except hold medications usual morning medications can be taken pre-cath with sips of water including aspirin 81 mg.    Confirmed patient has responsible adult to drive home post procedure and be with patient first 24 hours after arriving home.  [facility] does allow one visitor to accompany you and wait in the hospital waiting room while you are there for your procedure. You and your visitor will be asked to wear a mask once you enter the hospital.   Patient reports does not currently have any symptoms concerning for COVID-19 and no household members with COVID-19 like illness.      Reviewed procedure/mask/visitor instructions with patient.                                                  *Patient reports he is not currently taking Entresto.                         Patient states around 04/21/21 he did stop lisinopril (see 04/21/21 phone note) and start Entresto.                         Patient reports he received 30 day supply Entresto free, took for 30 days, but was unable get Entresto refill due to cost.                         Patient restarted lisinopril when he was unable to get Entresto refill and is currently taking lisinopril (will hold AM of cath per protocol- GFR 55).                           Patient advised prior authorization nurse will contact him for assistance with cost of Entresto, continue lisinopril for now.                           Patient is aware that if he is is able to get [facility] , he will need to stop  lisinopril, be off for 36 hours prior to starting Entresto.                                                  I will forward to Dr Johnsie Cancel for review.

## 2021-07-19 ENCOUNTER — Encounter (HOSPITAL_COMMUNITY)
Admission: AD | Disposition: A | Discharge status: Home / Self Care | Payer: Self-pay | Source: Home / Self Care | Attending: Cardiovascular Disease

## 2021-07-19 ENCOUNTER — Other Ambulatory Visit: Payer: Self-pay

## 2021-07-19 ENCOUNTER — Encounter (HOSPITAL_COMMUNITY): Payer: Self-pay | Admitting: Cardiovascular Disease

## 2021-07-19 ENCOUNTER — Inpatient Hospital Stay (HOSPITAL_COMMUNITY)
Admission: AD | Admit: 2021-07-19 | Discharge: 2021-07-21 | DRG: 286 | Disposition: A | Discharge status: Home / Self Care | Payer: BC Managed Care – PPO | Attending: Cardiovascular Disease | Admitting: Cardiovascular Disease

## 2021-07-19 DIAGNOSIS — N182 Chronic kidney disease, stage 2 (mild): Secondary | ICD-10-CM

## 2021-07-19 DIAGNOSIS — Z720 Tobacco use: Secondary | ICD-10-CM

## 2021-07-19 DIAGNOSIS — I472 Ventricular tachycardia, unspecified: Secondary | ICD-10-CM | POA: Diagnosis not present

## 2021-07-19 DIAGNOSIS — Z8249 Family history of ischemic heart disease and other diseases of the circulatory system: Secondary | ICD-10-CM

## 2021-07-19 DIAGNOSIS — J449 Chronic obstructive pulmonary disease, unspecified: Secondary | ICD-10-CM | POA: Diagnosis not present

## 2021-07-19 DIAGNOSIS — Z006 Encounter for examination for normal comparison and control in clinical research program: Secondary | ICD-10-CM

## 2021-07-19 DIAGNOSIS — E7800 Pure hypercholesterolemia, unspecified: Secondary | ICD-10-CM | POA: Diagnosis present

## 2021-07-19 DIAGNOSIS — Z79899 Other long term (current) drug therapy: Secondary | ICD-10-CM

## 2021-07-19 DIAGNOSIS — I77810 Thoracic aortic ectasia: Secondary | ICD-10-CM | POA: Diagnosis present

## 2021-07-19 DIAGNOSIS — I5043 Acute on chronic combined systolic (congestive) and diastolic (congestive) heart failure: Secondary | ICD-10-CM | POA: Insufficient documentation

## 2021-07-19 DIAGNOSIS — E1122 Type 2 diabetes mellitus with diabetic chronic kidney disease: Secondary | ICD-10-CM | POA: Diagnosis not present

## 2021-07-19 DIAGNOSIS — Z20822 Contact with and (suspected) exposure to covid-19: Secondary | ICD-10-CM | POA: Diagnosis present

## 2021-07-19 DIAGNOSIS — I428 Other cardiomyopathies: Secondary | ICD-10-CM | POA: Diagnosis not present

## 2021-07-19 DIAGNOSIS — Q2543 Congenital aneurysm of aorta: Secondary | ICD-10-CM | POA: Diagnosis present

## 2021-07-19 DIAGNOSIS — I130 Hypertensive heart and chronic kidney disease with heart failure and stage 1 through stage 4 chronic kidney disease, or unspecified chronic kidney disease: Principal | ICD-10-CM | POA: Diagnosis present

## 2021-07-19 DIAGNOSIS — I7121 Aneurysm of the ascending aorta, without rupture: Secondary | ICD-10-CM | POA: Diagnosis present

## 2021-07-19 DIAGNOSIS — E119 Type 2 diabetes mellitus without complications: Secondary | ICD-10-CM

## 2021-07-19 DIAGNOSIS — I493 Ventricular premature depolarization: Secondary | ICD-10-CM

## 2021-07-19 DIAGNOSIS — I5023 Acute on chronic systolic (congestive) heart failure: Secondary | ICD-10-CM | POA: Diagnosis not present

## 2021-07-19 DIAGNOSIS — I719 Aortic aneurysm of unspecified site, without rupture: Secondary | ICD-10-CM | POA: Diagnosis present

## 2021-07-19 DIAGNOSIS — Z7982 Long term (current) use of aspirin: Secondary | ICD-10-CM | POA: Diagnosis not present

## 2021-07-19 DIAGNOSIS — F17210 Nicotine dependence, cigarettes, uncomplicated: Secondary | ICD-10-CM | POA: Diagnosis present

## 2021-07-19 DIAGNOSIS — G4733 Obstructive sleep apnea (adult) (pediatric): Secondary | ICD-10-CM | POA: Diagnosis not present

## 2021-07-19 DIAGNOSIS — Z7984 Long term (current) use of oral hypoglycemic drugs: Secondary | ICD-10-CM

## 2021-07-19 DIAGNOSIS — I252 Old myocardial infarction: Secondary | ICD-10-CM | POA: Diagnosis not present

## 2021-07-19 DIAGNOSIS — I272 Pulmonary hypertension, unspecified: Secondary | ICD-10-CM

## 2021-07-19 DIAGNOSIS — I10 Essential (primary) hypertension: Secondary | ICD-10-CM

## 2021-07-19 DIAGNOSIS — I2510 Atherosclerotic heart disease of native coronary artery without angina pectoris: Secondary | ICD-10-CM | POA: Diagnosis not present

## 2021-07-19 DIAGNOSIS — I4729 Other ventricular tachycardia: Secondary | ICD-10-CM

## 2021-07-19 DIAGNOSIS — I330 Acute and subacute infective endocarditis: Secondary | ICD-10-CM | POA: Diagnosis not present

## 2021-07-19 HISTORY — DX: Type 2 diabetes mellitus without complications: E11.9

## 2021-07-19 HISTORY — DX: Other cardiomyopathies: I42.8

## 2021-07-19 HISTORY — DX: Ventricular premature depolarization: I49.3

## 2021-07-19 HISTORY — DX: Pulmonary hypertension, unspecified: I27.20

## 2021-07-19 HISTORY — DX: Other ventricular tachycardia: I47.29

## 2021-07-19 HISTORY — PX: RIGHT/LEFT HEART CATH AND CORONARY ANGIOGRAPHY: CATH118266

## 2021-07-19 HISTORY — DX: Thoracic aortic ectasia: I77.810

## 2021-07-19 HISTORY — DX: Chronic kidney disease, stage 2 (mild): N18.2

## 2021-07-19 LAB — POCT I-STAT EG7
Acid-Base Excess: 1 mmol/L (ref 0.0–2.0)
Bicarbonate: 26.8 mmol/L (ref 20.0–28.0)
Calcium, Ion: 1.16 mmol/L (ref 1.15–1.40)
HCT: 45 % (ref 39.0–52.0)
Hemoglobin: 15.3 g/dL (ref 13.0–17.0)
O2 Saturation: 63 %
Potassium: 3.9 mmol/L (ref 3.5–5.1)
Sodium: 141 mmol/L (ref 135–145)
TCO2: 28 mmol/L (ref 22–32)
pCO2, Ven: 47.3 mmHg (ref 44.0–60.0)
pH, Ven: 7.361 (ref 7.250–7.430)
pO2, Ven: 34 mmHg (ref 32.0–45.0)

## 2021-07-19 LAB — POCT I-STAT 7, (LYTES, BLD GAS, ICA,H+H)
Acid-base deficit: 1 mmol/L (ref 0.0–2.0)
Bicarbonate: 23.7 mmol/L (ref 20.0–28.0)
Calcium, Ion: 1.03 mmol/L — ABNORMAL LOW (ref 1.15–1.40)
HCT: 42 % (ref 39.0–52.0)
Hemoglobin: 14.3 g/dL (ref 13.0–17.0)
O2 Saturation: 94 %
Potassium: 3.5 mmol/L (ref 3.5–5.1)
Sodium: 143 mmol/L (ref 135–145)
TCO2: 25 mmol/L (ref 22–32)
pCO2 arterial: 40.4 mmHg (ref 32.0–48.0)
pH, Arterial: 7.377 (ref 7.350–7.450)
pO2, Arterial: 70 mmHg — ABNORMAL LOW (ref 83.0–108.0)

## 2021-07-19 LAB — CBC
HCT: 47.2 % (ref 39.0–52.0)
Hemoglobin: 15.7 g/dL (ref 13.0–17.0)
MCH: 31.8 pg (ref 26.0–34.0)
MCHC: 33.3 g/dL (ref 30.0–36.0)
MCV: 95.7 fL (ref 80.0–100.0)
Platelets: 170 10*3/uL (ref 150–400)
RBC: 4.93 MIL/uL (ref 4.22–5.81)
RDW: 14.2 % (ref 11.5–15.5)
WBC: 7.4 10*3/uL (ref 4.0–10.5)
nRBC: 0 % (ref 0.0–0.2)

## 2021-07-19 LAB — RESP PANEL BY RT-PCR (FLU A&B, COVID) ARPGX2
Influenza A by PCR: NEGATIVE
Influenza B by PCR: NEGATIVE
SARS Coronavirus 2 by RT PCR: NEGATIVE

## 2021-07-19 LAB — CREATININE, SERUM
Creatinine, Ser: 1.2 mg/dL (ref 0.61–1.24)
GFR, Estimated: >60 mL/min (ref >60)

## 2021-07-19 LAB — GLUCOSE, CAPILLARY
Glucose-Capillary: 116 mg/dL — ABNORMAL HIGH (ref 70–99)
Glucose-Capillary: 109 mg/dL — ABNORMAL HIGH (ref 70–99)

## 2021-07-19 SURGERY — RIGHT/LEFT HEART CATH AND CORONARY ANGIOGRAPHY
Anesthesia: LOCAL

## 2021-07-19 MED ORDER — LIDOCAINE HCL (PF) 1 % IJ SOLN
INTRAMUSCULAR | Status: AC
  Filled 2021-07-19: qty 30

## 2021-07-19 MED ORDER — CARVEDILOL 3.125 MG PO TABS
6.2500 mg | ORAL_TABLET | Freq: Two times a day (BID) | ORAL | Status: DC
  Administered 2021-07-19 – 2021-07-20 (×2): 6.25 mg via ORAL
  Filled 2021-07-19 (×2): qty 2

## 2021-07-19 MED ORDER — ASPIRIN 81 MG PO CHEW
81.0000 mg | CHEWABLE_TABLET | Freq: Every morning | ORAL | Status: DC
  Administered 2021-07-20 – 2021-07-21 (×2): 81 mg via ORAL
  Filled 2021-07-19 (×2): qty 1

## 2021-07-19 MED ORDER — HYDRALAZINE HCL 20 MG/ML IJ SOLN
10.0000 mg | INTRAMUSCULAR | Status: AC | PRN
  Administered 2021-07-19: 10 mg via INTRAVENOUS

## 2021-07-19 MED ORDER — SPIRONOLACTONE 25 MG PO TABS
25.0000 mg | ORAL_TABLET | Freq: Every day | ORAL | Status: DC
  Administered 2021-07-20 – 2021-07-21 (×2): 25 mg via ORAL
  Filled 2021-07-19 (×2): qty 1

## 2021-07-19 MED ORDER — SODIUM CHLORIDE 0.9% FLUSH
3.0000 mL | Freq: Two times a day (BID) | INTRAVENOUS | Status: DC
  Administered 2021-07-19 – 2021-07-21 (×4): 3 mL via INTRAVENOUS

## 2021-07-19 MED ORDER — SODIUM CHLORIDE 0.9% FLUSH: 3.0000 mL | INTRAVENOUS | Status: DC | PRN

## 2021-07-19 MED ORDER — HYDRALAZINE HCL 20 MG/ML IJ SOLN
INTRAMUSCULAR | Status: AC
  Filled 2021-07-19: qty 1

## 2021-07-19 MED ORDER — IOHEXOL 350 MG/ML SOLN
INTRAVENOUS | Status: DC | PRN
  Administered 2021-07-19: 50 mL via INTRA_ARTERIAL

## 2021-07-19 MED ORDER — ASPIRIN 81 MG PO CHEW
81.0000 mg | CHEWABLE_TABLET | ORAL | Status: AC
  Administered 2021-07-19: 81 mg via ORAL
  Filled 2021-07-19: qty 1

## 2021-07-19 MED ORDER — FENTANYL CITRATE (PF) 100 MCG/2ML IJ SOLN
INTRAMUSCULAR | Status: DC | PRN
  Administered 2021-07-19: 50 ug via INTRAVENOUS

## 2021-07-19 MED ORDER — SODIUM CHLORIDE 0.9 % IV SOLN: 250.0000 mL | INTRAVENOUS | Status: DC | PRN

## 2021-07-19 MED ORDER — FUROSEMIDE 10 MG/ML IJ SOLN
INTRAMUSCULAR | Status: DC | PRN
  Administered 2021-07-19: 40 mg via INTRAVENOUS

## 2021-07-19 MED ORDER — HEPARIN SODIUM (PORCINE) 1000 UNIT/ML IJ SOLN
INTRAMUSCULAR | Status: AC
  Filled 2021-07-19: qty 1

## 2021-07-19 MED ORDER — LISINOPRIL 10 MG PO TABS
40.0000 mg | ORAL_TABLET | Freq: Every morning | ORAL | Status: DC
  Administered 2021-07-20: 40 mg via ORAL
  Filled 2021-07-19: qty 4

## 2021-07-19 MED ORDER — VERAPAMIL HCL 2.5 MG/ML IV SOLN
INTRAVENOUS | Status: DC | PRN
  Administered 2021-07-19: 10 mL via INTRA_ARTERIAL

## 2021-07-19 MED ORDER — LIDOCAINE HCL (PF) 1 % IJ SOLN
INTRAMUSCULAR | Status: DC | PRN
  Administered 2021-07-19 (×2): 2 mL

## 2021-07-19 MED ORDER — FUROSEMIDE 10 MG/ML IJ SOLN
INTRAMUSCULAR | Status: AC
  Filled 2021-07-19: qty 4

## 2021-07-19 MED ORDER — LABETALOL HCL 5 MG/ML IV SOLN: 10.0000 mg | INTRAVENOUS | Status: AC | PRN

## 2021-07-19 MED ORDER — MIDAZOLAM HCL 2 MG/2ML IJ SOLN
INTRAMUSCULAR | Status: DC | PRN
  Administered 2021-07-19: 2 mg via INTRAVENOUS

## 2021-07-19 MED ORDER — HEPARIN (PORCINE) IN NACL 1000-0.9 UT/500ML-% IV SOLN
INTRAVENOUS | Status: DC | PRN
  Administered 2021-07-19 (×2): 500 mL

## 2021-07-19 MED ORDER — ACETAMINOPHEN 325 MG PO TABS
650.0000 mg | ORAL_TABLET | ORAL | Status: DC | PRN
Start: 2021-07-19 — End: 2021-07-21
  Administered 2021-07-19: 650 mg via ORAL
  Filled 2021-07-19: qty 2

## 2021-07-19 MED ORDER — VERAPAMIL HCL 2.5 MG/ML IV SOLN
INTRAVENOUS | Status: AC
  Filled 2021-07-19: qty 2

## 2021-07-19 MED ORDER — SODIUM CHLORIDE 0.9 % IV SOLN: INTRAVENOUS | Status: DC

## 2021-07-19 MED ORDER — HEPARIN (PORCINE) IN NACL 1000-0.9 UT/500ML-% IV SOLN
INTRAVENOUS | Status: AC
  Filled 2021-07-19: qty 1000

## 2021-07-19 MED ORDER — FUROSEMIDE 10 MG/ML IJ SOLN
40.0000 mg | Freq: Two times a day (BID) | INTRAMUSCULAR | Status: DC
Start: 2021-07-19 — End: 2021-07-20
  Administered 2021-07-19 – 2021-07-20 (×2): 40 mg via INTRAVENOUS
  Filled 2021-07-19 (×2): qty 4

## 2021-07-19 MED ORDER — ONDANSETRON HCL 4 MG/2ML IJ SOLN: 4.0000 mg | Freq: Four times a day (QID) | INTRAMUSCULAR | Status: DC | PRN

## 2021-07-19 MED ORDER — ENOXAPARIN SODIUM 40 MG/0.4ML IJ SOSY
40.0000 mg | PREFILLED_SYRINGE | INTRAMUSCULAR | Status: DC
  Administered 2021-07-20 – 2021-07-21 (×2): 40 mg via SUBCUTANEOUS
  Filled 2021-07-19 (×2): qty 0.4

## 2021-07-19 MED ORDER — FENTANYL CITRATE (PF) 100 MCG/2ML IJ SOLN
INTRAMUSCULAR | Status: AC
  Filled 2021-07-19: qty 2

## 2021-07-19 MED ORDER — MIDAZOLAM HCL 2 MG/2ML IJ SOLN
INTRAMUSCULAR | Status: AC
  Filled 2021-07-19: qty 2

## 2021-07-19 MED ORDER — HEPARIN SODIUM (PORCINE) 1000 UNIT/ML IJ SOLN
INTRAMUSCULAR | Status: DC | PRN
  Administered 2021-07-19: 6000 [IU] via INTRAVENOUS

## 2021-07-19 MED ORDER — ISOSORBIDE MONONITRATE ER 60 MG PO TB24
120.0000 mg | ORAL_TABLET | Freq: Every day | ORAL | Status: DC
  Administered 2021-07-20: 120 mg via ORAL
  Filled 2021-07-19: qty 2

## 2021-07-19 SURGICAL SUPPLY — 12 items
CATH BALLN WEDGE 5F 110CM (CATHETERS) ×2 IMPLANT
CATH INFINITI 5F JL4 125CM (CATHETERS) ×2 IMPLANT
CATH INFINITI 5FR JR4 125CM (CATHETERS) ×2 IMPLANT
DEVICE RAD COMP TR BAND LRG (VASCULAR PRODUCTS) ×2 IMPLANT
GLIDESHEATH SLEND SS 6F .021 (SHEATH) ×2 IMPLANT
GUIDEWIRE INQWIRE 1.5J.035X260 (WIRE) ×1 IMPLANT
INQWIRE 1.5J .035X260CM (WIRE) ×2
KIT HEART LEFT (KITS) ×2 IMPLANT
PACK CARDIAC CATHETERIZATION (CUSTOM PROCEDURE TRAY) ×2 IMPLANT
SHEATH GLIDE SLENDER 4/5FR (SHEATH) ×2 IMPLANT
TRANSDUCER W/STOPCOCK (MISCELLANEOUS) ×2 IMPLANT
TUBING CIL FLEX 10 FLL-RA (TUBING) ×2 IMPLANT

## 2021-07-19 NOTE — Research (Signed)
Subject # [id number] [employer] Lp(a) [id number] Site # [phone]  SEX _0    Male                      _1    Male  Ethnicity _2   Hispanic or Latino   _3   Not Hispanic or Latino  Race _4   White                 _5   Black or African American  _6   Asian _7   American [place] or [place] Native            _8   Native Hawaiian or Other Pacific Islander                      _9   Other  Other   Age 60  Subject Group _10    Local Lab           _11   Historical Lp(a) value                                       Results:  Future research _12    Yes                    _13   No    [employer] [id number] Site # [id number] Subject ID # [id number]         ELIGIBILITY CRITERIA WORKSHEET INCLUSION CRITERIA   Subject has provided informed consent prior to the initiation of any study specific activities/procedures _14   Age 72 to 26 years _15   MI (presumed type 1) OR _16   PCI (with high-risk features) with at least 1 of the following: _17   Age >65 _18   Diabetes mellitus  HbA1c: _19   History of ischemic stroke _20   History of peripheral arterial disease _21   Residual stenosis ? 50% _22   Multivessel PCI (ie, ? 2 vessels, including branch arteries _23   EXCLUSIONS THE FOLLOWING N/A _24   Subjects known to be currently receiving investigational drug in a clinical study that is anticipated to last > 1 year _25   Known Lp(a) value <54m/dL or < 200nmol/L _26   Subject has a diagnosis of end-stage renal disease or requires dialysis. _27   Poorly controlled (glycated hemoglobin [HbA1c] > 10%) diabetes mellitus (type 1 or type 2) _28   Subject is receiving or has received lipoprotein apheresis to reduce Lp(a) within 3 months prior to enrollment. _29   Known uncontrolled or recurrent ventricular tachycardia in the past 3 months prior to enrollment. _30   Known malignancy (except non-melanoma skin cancers, cervical in situ carcinoma, breast ductal carcinoma in situ, or stage 1 prostate carcinoma) within the last 5 years prior to enrollment. _31   Known history  or evidence of clinically significant disease (eg, respiratory, gastrointestinal, or psychiatric disease) or unstable disorder or biomarker that, in the opinion of the investigator(s), would result in life expectancy < 5 years. _32   Known hemorrhagic stroke. _33    [employer] Lp(a) Informed Consent   Subject Name: James [name] Subject met inclusion and exclusion criteria.  The informed consent form, study requirements and expectations were reviewed with the subject and questions and concerns were addressed prior to the signing of the consent form.  The subject verbalized understanding of the trial requirements.  The subject agreed to participate in the [facility] trial and signed the informed consent at 1025 on 07/19/2021.  The informed consent was obtained prior to  performance of any protocol-specific procedures for the subject.  A copy of the signed informed consent was given to the subject and a copy was placed in the subject's medical record.   Chanda Busing  [employer] Consent Version 2 Protocol Version 2

## 2021-07-19 NOTE — Interval H&P Note (Signed)
History and Physical Interval Note:  07/19/2021 10:34 AM  James [name]  has presented today for surgery, with the diagnosis of abnormal stress test.  The various methods of treatment have been discussed with the patient and family. After consideration of risks, benefits and other options for treatment, the patient has consented to  Procedure(s): RIGHT/LEFT HEART CATH AND CORONARY ANGIOGRAPHY (N/A) as a surgical intervention.  The patient's history has been reviewed, patient examined, no change in status, stable for surgery.  I have reviewed the patient's chart and labs.  Questions were answered to the patient's satisfaction.    Cath Lab Visit (complete for each Cath Lab visit)  Clinical Evaluation Leading to the Procedure:   ACS: No.  Non-ACS:    Anginal Classification: No Symptoms  Anti-ischemic medical therapy: Maximal Therapy (2 or more classes of medications)  Non-Invasive Test Results: High risk stress test  Prior CABG: No previous CABG        Lauree Chandler

## 2021-07-20 ENCOUNTER — Encounter (HOSPITAL_COMMUNITY): Payer: Self-pay | Admitting: Cardiovascular Disease

## 2021-07-20 DIAGNOSIS — I10 Essential (primary) hypertension: Secondary | ICD-10-CM

## 2021-07-20 DIAGNOSIS — N182 Chronic kidney disease, stage 2 (mild): Secondary | ICD-10-CM

## 2021-07-20 LAB — BASIC METABOLIC PANEL
Anion gap: 8 (ref 5–15)
BUN: 19 mg/dL (ref 6–20)
CO2: 25 mmol/L (ref 22–32)
Calcium: 8.8 mg/dL — ABNORMAL LOW (ref 8.9–10.3)
Chloride: 106 mmol/L (ref 98–111)
Creatinine, Ser: 1.25 mg/dL — ABNORMAL HIGH (ref 0.61–1.24)
GFR, Estimated: >60 mL/min (ref >60)
Glucose, Bld: 128 mg/dL — ABNORMAL HIGH (ref 70–99)
Potassium: 3.7 mmol/L (ref 3.5–5.1)
Sodium: 139 mmol/L (ref 135–145)

## 2021-07-20 LAB — HEMOGLOBIN A1C
Hgb A1c MFr Bld: 5.9 % — ABNORMAL HIGH (ref 4.8–5.6)
Mean Plasma Glucose: 122.63 mg/dL

## 2021-07-20 LAB — GLUCOSE, CAPILLARY
Glucose-Capillary: 241 mg/dL — ABNORMAL HIGH (ref 70–99)
Glucose-Capillary: 124 mg/dL — ABNORMAL HIGH (ref 70–99)
Glucose-Capillary: 210 mg/dL — ABNORMAL HIGH (ref 70–99)
Glucose-Capillary: 150 mg/dL — ABNORMAL HIGH (ref 70–99)

## 2021-07-20 LAB — MAGNESIUM: Magnesium: 2.2 mg/dL (ref 1.7–2.4)

## 2021-07-20 LAB — LIPOPROTEIN A (LPA): Lipoprotein (a): 79.7 nmol/L — ABNORMAL HIGH (ref <75.0)

## 2021-07-20 MED ORDER — FUROSEMIDE 40 MG PO TABS
40.0000 mg | ORAL_TABLET | Freq: Every day | ORAL | Status: DC
  Administered 2021-07-21: 40 mg via ORAL
  Filled 2021-07-20: qty 1

## 2021-07-20 MED ORDER — CARVEDILOL 12.5 MG PO TABS
12.5000 mg | ORAL_TABLET | Freq: Two times a day (BID) | ORAL | Status: DC
  Administered 2021-07-20 – 2021-07-21 (×2): 12.5 mg via ORAL
  Filled 2021-07-20 (×2): qty 1

## 2021-07-20 MED ORDER — SACUBITRIL-VALSARTAN 49-51 MG PO TABS
1.0000 | ORAL_TABLET | Freq: Two times a day (BID) | ORAL | Status: DC
  Filled 2021-07-20: qty 1

## 2021-07-20 MED ORDER — INSULIN ASPART 100 UNIT/ML IJ SOLN
0.0000 [IU] | Freq: Three times a day (TID) | INTRAMUSCULAR | Status: DC
  Administered 2021-07-20 (×2): 3 [IU] via SUBCUTANEOUS
  Administered 2021-07-21: 2 [IU] via SUBCUTANEOUS

## 2021-07-20 MED ORDER — POTASSIUM CHLORIDE CRYS ER 20 MEQ PO TBCR
40.0000 meq | EXTENDED_RELEASE_TABLET | Freq: Once | ORAL | Status: AC
  Administered 2021-07-20: 40 meq via ORAL
  Filled 2021-07-20: qty 2

## 2021-07-20 MED ORDER — ISOSORBIDE MONONITRATE ER 60 MG PO TB24
60.0000 mg | ORAL_TABLET | Freq: Every day | ORAL | Status: DC
  Administered 2021-07-21: 60 mg via ORAL
  Filled 2021-07-20: qty 1

## 2021-07-20 NOTE — Progress Notes (Signed)
Paged cardiology, advised pt had 5 beat run of v tach. Pt reports having episodes of shortness of breath.

## 2021-07-20 NOTE — Research (Signed)
Consent for [employer] Lpa was signed by Lily Lovings RN with patient on 07/19/2021

## 2021-07-20 NOTE — Progress Notes (Addendum)
Progress Note  Patient Name: James [name] Date of Encounter: 07/20/2021  [facility] Cardiologist: Jenkins Rouge MD  Subjective   Patient states he is feeling OK today. He reports 1 week onset of BLE edema that is much improved now. He states he has been having episodes that he felt he need to breath deeply. He denied any chest pain/pressure, resting SOB, dizziness, heart palpitation, syncope. He relates some of the deep breathing episodes to the NSVT episodes that was occurring.   Inpatient Medications    Scheduled Meds:  aspirin  81 mg Oral q AM   carvedilol  6.25 mg Oral BID   enoxaparin (LOVENOX) injection  40 mg Subcutaneous Q24H   furosemide  40 mg Intravenous BID   insulin aspart  0-9 Units Subcutaneous TID WC   isosorbide mononitrate  120 mg Oral Daily   potassium chloride  40 mEq Oral Once   sodium chloride flush  3 mL Intravenous Q12H   spironolactone  25 mg Oral Daily   Continuous Infusions:  sodium chloride     PRN Meds: sodium chloride, acetaminophen, ondansetron (ZOFRAN) IV, sodium chloride flush   Vital Signs    Vitals:   07/19/21 1648 07/19/21 2031 07/20/21 0130 07/20/21 0434  BP: 123/65 125/84 118/83 131/86  Pulse: 64 70 64 (!) 59  Resp: 16 18 18 18   Temp: 98.8 F (37.1 C) 99.1 F (37.3 C) 98.9 F (37.2 C) 98.8 F (37.1 C)  TempSrc: Oral Oral Oral Oral  SpO2: 100% 100% 98% 99%  Weight:      Height:        Intake/Output Summary (Last 24 hours) at 07/20/2021 1029 Last data filed at 07/20/2021 0438 Gross per 24 hour  Intake 440 ml  Output 7750 ml  Net -7310 ml   Last 3 Weights 07/19/2021 07/14/2021 06/02/2021  Weight (lbs) 270 lb 279 lb 259 lb  Weight (kg) 122.471 kg 126.554 kg 117.482 kg  Some encounter information is confidential and restricted. Go to Review Flowsheets activity to see all data.      Telemetry    Sinus rhythm with PACs and PVCs, fairly frequent NSVT lasting up to 18 beats noted - Personally Reviewed  ECG    N/A  this AM - Personally Reviewed  Physical Exam   GEN: No acute distress.   Neck: JVD up to jaw line with HOB 30 degree  Cardiac: RRR, no murmurs, rubs, or gallops.  Respiratory: Bilateral rales to auscultation bilaterally. On room air. Speaks full sentence  GI: Soft, nontender, non-distended  MS: 2+ BLE edema; No deformity. Neuro:  Nonfocal  Psych: Normal affect  Right wrist without bleeding/hemtoma  Labs    High Sensitivity Troponin:  No results for input(s): TROPONINIHS in the last 720 hours.   Chemistry Recent Labs  Lab 07/14/21 1449 07/19/21 1305 07/19/21 1711 07/20/21 0925  NA 141 141  143  --  139  K 3.6 3.9  3.5  --  3.7  CL 106  --   --  106  CO2 29  --   --  25  GLUCOSE 112*  --   --  128*  BUN 21*  --   --  19  CREATININE 1.46*  --  1.20 1.25*  CALCIUM 8.0*  --   --  8.8*  MG  --   --   --  2.2  GFRNONAA 55*  --  >60 >60  ANIONGAP 6  --   --  8  Lipids No results for input(s): CHOL, TRIG, HDL, LABVLDL, LDLCALC, CHOLHDL in the last 168 hours.  Hematology Recent Labs  Lab 07/14/21 1447 07/19/21 1305 07/19/21 1711  WBC 8.6  --  7.4  RBC 4.60  --  4.93  HGB 14.7 15.3  14.3 15.7  HCT 45.0 45.0  42.0 47.2  MCV 97.8  --  95.7  MCH 32.0  --  31.8  MCHC 32.7  --  33.3  RDW 14.2  --  14.2  PLT 156  --  170   Thyroid No results for input(s): TSH, FREET4 in the last 168 hours.  BNPNo results for input(s): BNP, PROBNP in the last 168 hours.  DDimer No results for input(s): DDIMER in the last 168 hours.   Radiology    CARDIAC CATHETERIZATION  Result Date: 14/01/8184   LV end diastolic pressure is severely elevated.   Hemodynamic findings consistent with severe pulmonary hypertension. No angiographic evidence of CAD Elevated right and left heart pressures (LV: 130/22/39, RA 15, RV 82/13/20, PA 83/30 mean 49, PCWP 35). Recommendations: Will admit to telemetry for diuresis with IV Lasix given ongoing dyspnea, volume overload and severely elevated filling  pressures.    Cardiac Studies   Right and left heart cath on 07/19/21:    LV end diastolic pressure is severely elevated.   Hemodynamic findings consistent with severe pulmonary hypertension.   No angiographic evidence of CAD Elevated right and left heart pressures (LV: 130/22/39, RA 15, RV 82/13/20, PA 83/30 mean 49, PCWP 35).    Recommendations: Will admit to telemetry for diuresis with IV Lasix given ongoing dyspnea, volume overload and severely elevated filling pressures.   NM stress Myovue 07/03/21:    Findings are consistent with prior anteroseptal and inferolateral myocardial infarction. There is no current ischemia. The study is high risk. Risk based on scar and decreased LVEF, consider correlating LVEF with echo.   No ST deviation was noted. Converted to pharmacological stress test due to frequent ventricular ectopy with exercise. PVCs, bigeminy, and accelerated idioventricular rhythm noted.   LV perfusion is abnormal.   Left ventricular function is abnormal. Nuclear stress EF: 22 %. The left ventricular ejection fraction is severely decreased (<30%). End systolic cavity size is severely enlarged.  Echo from 04/20/21:   1. LVEF is severely depressed with diffuse hypokinesis; apical akinesis.  Global longitudincal strain is -8.5%. Left ventricular ejection fraction,  by estimation, is 25%%. The left ventricle has severely decreased  function. The left ventricle  demonstrates global hypokinesis. The left ventricular internal cavity size  was severely dilated. There is mild left ventricular hypertrophy. Left  ventricular diastolic parameters are consistent with Grade III diastolic  dysfunction (restrictive). Elevated   left atrial pressure.   2. Right ventricular systolic function is low normal. The right  ventricular size is normal.   3. Left atrial size was mildly dilated.   4. The mitral valve is normal in structure. Mild mitral valve  regurgitation.   5. The aortic valve is  tricuspid. Aortic valve regurgitation is not  visualized. Mild aortic valve sclerosis is present, with no evidence of  aortic valve stenosis.   6. Aortic dilatation noted. There is moderate dilatation of the aortic  root, measuring 46 mm.   Comparison(s): The left ventricular function is worsened.     Patient Profile     60 y.o. male with PMH of HTN, non-ischemic CM, chronic systolic and diastolic heart failure, tobacco abuse, OSA, HLD, type 2 DM, who is here  on 07/19/21 for elective right and left heart cath due to recent stress Myovue showing high risk study with hx of ischemia.   Assessment & Plan     Acute on chronic systolic and diastolic heart failure  Non-ischemic CM  Pulmonary hypertension  - presented for elective right and left heart cath 07/19/21, had 1 week BLE edema, occasional SOB - Echo from 04/20/21 with EF 25% LV global hypokinesis LV severely dilated, mild LVH, grade III DD, RV low normal, mild MR - Right and left heart cath 10/5 showed no CAD; Elevated right and left heart pressures (LV: 130/22/39, RA 15, RV 82/13/20, PA 83/30 mean 49, PCWP 35).  - on PO Lasix 40mg  daily at home, now on IV Lasix 40mg  BID, Net -7.3L in <24 hours, edema improving  - will continue IV Lasix 40mg  BID given good response, monitor daily weight and I&O - GDMT: continue Coreg 6.25mg  BID, Imdur 120mg ,  and spironolactone 25mg ; will stop lisinopril , transition to Entresto in 36 hours if renal stable and add SGLT2i before DC - will ask TOC HF impact clinic to see today   NSVT - up to 18 beats noted on telemetry , associated with episodes of SOB - will check STAT BMP and Mag today, K 3.7 and Mag 2.2, optimize K ordered  - consider AICD   Tobacco use - smokes 1 pack 1-2 days - discussed smoking cessation  - needs outpatient PFT for COPD evaluation  HTN - BP controlled on above regimen   Type 2 DM  - will check A1C  -hold metformin  - [facility] diet , will add short acting insulin SSI  AC  OSA - CPAP ordered for nightly use    For questions or updates, please contact [facility] Please consult [email or web address] for contact info under        Signed, Margie Billet, NP  07/20/2021, 10:29 AM

## 2021-07-21 ENCOUNTER — Other Ambulatory Visit (HOSPITAL_COMMUNITY): Payer: Self-pay

## 2021-07-21 ENCOUNTER — Other Ambulatory Visit: Payer: Self-pay | Admitting: Physician Assistant

## 2021-07-21 ENCOUNTER — Encounter (HOSPITAL_COMMUNITY): Payer: Self-pay | Admitting: Cardiovascular Disease

## 2021-07-21 ENCOUNTER — Inpatient Hospital Stay (HOSPITAL_COMMUNITY): Payer: BC Managed Care – PPO

## 2021-07-21 DIAGNOSIS — I4729 Other ventricular tachycardia: Secondary | ICD-10-CM

## 2021-07-21 DIAGNOSIS — I428 Other cardiomyopathies: Secondary | ICD-10-CM

## 2021-07-21 DIAGNOSIS — Z720 Tobacco use: Secondary | ICD-10-CM

## 2021-07-21 DIAGNOSIS — I272 Pulmonary hypertension, unspecified: Secondary | ICD-10-CM

## 2021-07-21 DIAGNOSIS — I330 Acute and subacute infective endocarditis: Secondary | ICD-10-CM | POA: Diagnosis not present

## 2021-07-21 DIAGNOSIS — E119 Type 2 diabetes mellitus without complications: Secondary | ICD-10-CM

## 2021-07-21 DIAGNOSIS — I493 Ventricular premature depolarization: Secondary | ICD-10-CM

## 2021-07-21 LAB — BASIC METABOLIC PANEL
Anion gap: 6 (ref 5–15)
BUN: 21 mg/dL — ABNORMAL HIGH (ref 6–20)
CO2: 24 mmol/L (ref 22–32)
Calcium: 8.7 mg/dL — ABNORMAL LOW (ref 8.9–10.3)
Chloride: 107 mmol/L (ref 98–111)
Creatinine, Ser: 1.21 mg/dL (ref 0.61–1.24)
GFR, Estimated: >60 mL/min (ref >60)
Glucose, Bld: 102 mg/dL — ABNORMAL HIGH (ref 70–99)
Potassium: 4.5 mmol/L (ref 3.5–5.1)
Sodium: 137 mmol/L (ref 135–145)

## 2021-07-21 LAB — GLUCOSE, CAPILLARY
Glucose-Capillary: 158 mg/dL — ABNORMAL HIGH (ref 70–99)
Glucose-Capillary: 116 mg/dL — ABNORMAL HIGH (ref 70–99)
Glucose-Capillary: 172 mg/dL — ABNORMAL HIGH (ref 70–99)

## 2021-07-21 LAB — ECHOCARDIOGRAM COMPLETE
Area-P 1/2: 4.68 cm2
Calc EF: 20.1 %
Height: 78 in
S' Lateral: 6.1 cm
Single Plane A2C EF: 11.9 %
Single Plane A4C EF: 30 %
Weight: 4102.4 oz

## 2021-07-21 LAB — MAGNESIUM: Magnesium: 2.3 mg/dL (ref 1.7–2.4)

## 2021-07-21 LAB — TSH: TSH: 1.34 u[IU]/mL (ref 0.350–4.500)

## 2021-07-21 MED ORDER — ISOSORBIDE MONONITRATE ER 60 MG PO TB24
60.0000 mg | ORAL_TABLET | Freq: Every day | ORAL | 6 refills | Status: DC
  Filled 2021-07-21: qty 30, 30d supply, fill #0

## 2021-07-21 MED ORDER — LIVING BETTER WITH HEART FAILURE BOOK: Freq: Once | Status: AC

## 2021-07-21 MED ORDER — ENTRESTO 24-26 MG PO TABS
1.0000 | ORAL_TABLET | Freq: Two times a day (BID) | ORAL | 6 refills | Status: DC
  Filled 2021-07-21: qty 60, 30d supply, fill #0

## 2021-07-21 MED ORDER — SPIRONOLACTONE 12.5 MG HALF TABLET: 12.5000 mg | ORAL_TABLET | Freq: Every day | ORAL | Status: DC

## 2021-07-21 MED ORDER — SACUBITRIL-VALSARTAN 24-26 MG PO TABS: 1.0000 | ORAL_TABLET | Freq: Two times a day (BID) | ORAL | Status: DC

## 2021-07-21 MED ORDER — CARVEDILOL 12.5 MG PO TABS
12.5000 mg | ORAL_TABLET | Freq: Two times a day (BID) | ORAL | 6 refills | Status: DC
  Filled 2021-07-21: qty 60, 30d supply, fill #0

## 2021-07-21 NOTE — Progress Notes (Signed)
Advised cardiology that heart rate has been seen in the 40's and 50's. Pt asymptomatic. Advised to continue with discharge.

## 2021-07-21 NOTE — Discharge Summary (Addendum)
Discharge Summary    Patient ID: James [name] MRN: [id number]; DOB: 1961-06-17  Admit date: 07/19/2021 Discharge date: 07/21/2021  PCP:  Rosita Fire, MD   [place] Providers Cardiologist:  Jenkins Rouge, MD        Discharge Diagnoses    Principal Problem:   Acute on chronic combined systolic and diastolic CHF (congestive heart failure) ([place]) Active Problems:   Essential hypertension   Cardiomyopathy, nonischemic ([place])   Obstructive sleep apnea   Aortic root dilatation (HCC)   Stage 2 chronic kidney disease   Frequent PVCs   NSVT (nonsustained ventricular tachycardia)   Pulmonary hypertension, unspecified ([place])   Tobacco abuse   Diabetes mellitus type 2 in nonobese [facility])    Diagnostic Studies/Procedures    2D echo 07/21/21 1. Left ventricular ejection fraction, by estimation, is 25 to 30%. The  left ventricle has severely decreased function. The left ventricle  demonstrates global hypokinesis. The left ventricular internal cavity size  was moderately dilated. Left  ventricular diastolic parameters are consistent with Grade III diastolic  dysfunction (restrictive).   2. Right ventricular systolic function is mildly reduced. The right  ventricular size is mildly enlarged.   3. Left atrial size was moderately dilated.   4. Right atrial size was moderately dilated.   5. The mitral valve is normal in structure. Moderate mitral valve  regurgitation. No evidence of mitral stenosis.   6. The aortic valve is normal in structure. Aortic valve regurgitation is  not visualized. No aortic stenosis is present.   7. Aortic dilatation noted. There is moderate dilatation of the aortic  root, measuring 46 mm.   8. The inferior vena cava is normal in size with greater than 50%  respiratory variability, suggesting right atrial pressure of 3 mmHg.   Comparison(s): No significant change from prior study. Prior images  reviewed side by side.    Conclusion(s)/Recommendation(s): No evidence of valvular vegetations on  this transthoracic echocardiogram. Would recommend a transesophageal  echocardiogram to exclude infective endocarditis if clinically indicated.   R/LHC 62/2/29   LV end diastolic pressure is severely elevated.   Hemodynamic findings consistent with severe pulmonary hypertension.   No angiographic evidence of CAD Elevated right and left heart pressures (LV: 130/22/39, RA 15, RV 82/13/20, PA 83/30 mean 49, PCWP 35).    Recommendations: Will admit to telemetry for diuresis with IV Lasix given ongoing dyspnea, volume overload and severely elevated filling pressures.    _____________   History of Present Illness     James [name] is a 60 y.o. male with HTN, NICM, chronic combined CHF, tobacco abuse, OSA, CKD stage II, HLD, type 2 DM. He previously saw Dr. Lattie Haw and Dr. Bronson Ing for diagnosis of NICM. He had a remote cath in 2013 showing no CAD. His previous echo before recently was in 2015 showing EF 45-50%. He recently re-established care in clinic 04/2021 after a long hiatus away. Follow-up echo 04/2021 showed further decline in EF to 25%. Coronary CTA was planned but could not due because of ectopy. He underwent nuc that was high risk. The study also showed frequent ventricular ectopy with PVCs, bigeminy, and accelerated idioventricular rhythm.   Due to findings, he presented for outpatient planned [facility] 07/19/21. This showed no angiographic evidence of CAD, but did demonstrate severely elevated LVEDP and severe pulmonary HTN prompting admission for diuresis.  Hospital Course     1. Acute on chronic combined CHF/NICM - etiology of cardiomyopathy not clear at this time - frequent  ectopy noted as well - will need additional OP workup - was diuresed with clinical improvement - med changes this admission include increase in carvedilol from 6.25mg  to 12.5mg  BID, decrease in Imdur from 120mg  to 60mg  daily to  accommodate beta blocker titration, change from lisinopril to Entresto (to begin tonight post-washout) - he will otherwise continue Lasix and spironolactone - seen by [facility] clinic who assisted with copay card for Entresto (previously could not afford), anticipate SGLT2i in close follow-up if tolerating regimen above - repeat echo today showed continued LVEF 25-30% - we will plan for outpatient cardiac MRI (message sent to office to arrange) as well as HF TOC [facility] follow-up, general cardiology follow-up and establishment with Advanced HF Clinic (all scheduled as below) - EP referral has also been requested - per Dr. Oval Linsey, remain out of work until seen in HF TOC visit  2. Frequent PVCs, NSVT - continue carvedilol - dose increased yesterday, cannot titrate further due to baseline sinus bradycardia - would watch HR as outpatient - did not have as much ectopy on telemetry on AM review today - will refer to EP as outpatient to consider ICD - message sent to EP scheduler - Dr. Oval Linsey did not feel he required Lifevest at this time - no history of syncope and no prolonged episodes on telemetry - K, Mg, TSH at goal today but will need f/u OP lytes in TOC follow-up   3. Severe pulmonary HTN by cath - may have been in context of volume overload, not specifically mentioned on repeat echocardiogram   4. Essential HTN - managed in context above   5. CKD stage II-IIIa - Cr appears at baseline, which is 1.2-1.4 lately   6. OSA - patient denies ever using CPAP at home, did not recall having this formally diagnosed - last sleep study 2017 therefore would consider setting up for repeat assessment as OP   7. Tobacco abuse - cessation advised - consider outpatient PFTs for COPD evaluation per earlier hospital notes   8. Type 2DM - metformin remains on hold post-cath - patient advised to restart in AM  9. Moderate dilation of aortic root 60mm - will need OP follow-up of this  The  patient is feeling much better today. DC weight is 246.4lb. All follow-up arranged below. New meds sent to [facility] clinic. Work note provided. Dr. Oval Linsey has seen and examined the patient today and feels he is stable for discharge.   Did the patient have an acute coronary syndrome (MI, NSTEMI, STEMI, etc) this admission?:  No                               Did the patient have a percutaneous coronary intervention (stent / angioplasty)?:  No.       _____________  Discharge Vitals Blood pressure 122/86, pulse 60, temperature 98.1 F (36.7 C), temperature source Oral, resp. rate 18, height 6\' 6"  (1.981 m), weight 116.3 kg, SpO2 99 %.  Filed Weights   07/19/21 1002 07/21/21 0419  Weight: 122.5 kg 116.3 kg    Labs & Radiologic Studies    CBC Recent Labs    07/19/21 1305 07/19/21 1711  WBC  --  7.4  HGB 15.3  14.3 15.7  HCT 45.0  42.0 47.2  MCV  --  95.7  PLT  --  440   Basic Metabolic Panel Recent Labs    07/20/21 0925 07/21/21 1044  NA  139 137  K 3.7 4.5  CL 106 107  CO2 25 24  GLUCOSE 128* 102*  BUN 19 21*  CREATININE 1.25* 1.21  CALCIUM 8.8* 8.7*  MG 2.2 2.3   Hemoglobin A1C Recent Labs    07/20/21 0925  HGBA1C 5.9*   Thyroid Function Tests Recent Labs    07/21/21 1044  TSH 1.340   _____________  CARDIAC CATHETERIZATION  Result Date: 03/16/3761   LV end diastolic pressure is severely elevated.   Hemodynamic findings consistent with severe pulmonary hypertension. No angiographic evidence of CAD Elevated right and left heart pressures (LV: 130/22/39, RA 15, RV 82/13/20, PA 83/30 mean 49, PCWP 35). Recommendations: Will admit to telemetry for diuresis with IV Lasix given ongoing dyspnea, volume overload and severely elevated filling pressures.   NM Myocar Multi W/Spect W/Wall Motion / EF  Result Date: 07/03/2021   Findings are consistent with prior anteroseptal and inferolateral myocardial infarction. There is no current ischemia. The study is high risk. Risk  based on scar and decreased LVEF, consider correlating LVEF with echo.   No ST deviation was noted. Converted to pharmacological stress test due to frequent ventricular ectopy with exercise. PVCs, bigeminy, and accelerated idioventricular rhythm noted.   LV perfusion is abnormal.   Left ventricular function is abnormal. Nuclear stress EF: 22 %. The left ventricular ejection fraction is severely decreased (<30%). End systolic cavity size is severely enlarged.   ECHOCARDIOGRAM COMPLETE  Result Date: 07/21/2021    ECHOCARDIOGRAM REPORT   Patient Name:   NIKO PENSON Date of Exam: 07/21/2021 Medical Rec #:  [id number]         Height:       78.0 in Accession #:    [id number]        Weight:       256.4 lb Date of Birth:  1961/08/24         BSA:          2.509 m Patient Age:    60 years          BP:           127/97 mmHg Patient Gender: M                 HR:           56 bpm. Exam Location:  Inpatient Procedure: 2D Echo, Color Doppler and Cardiac Doppler Indications:    SBE  History:        Patient has prior history of Echocardiogram examinations, most                 recent 04/20/2021. Cardiomegaly and CHF; Risk Factors:Hypertension                 and Dyslipidemia.  Sonographer:    Melissa Morford RDCS (AE, PE) Referring Phys: [id number] Margie Billet IMPRESSIONS  1. Left ventricular ejection fraction, by estimation, is 25 to 30%. The left ventricle has severely decreased function. The left ventricle demonstrates global hypokinesis. The left ventricular internal cavity size was moderately dilated. Left ventricular diastolic parameters are consistent with Grade III diastolic dysfunction (restrictive).  2. Right ventricular systolic function is mildly reduced. The right ventricular size is mildly enlarged.  3. Left atrial size was moderately dilated.  4. Right atrial size was moderately dilated.  5. The mitral valve is normal in structure. Moderate mitral valve regurgitation. No evidence of mitral stenosis.  6. The aortic  valve is normal in structure. Aortic valve regurgitation is  not visualized. No aortic stenosis is present.  7. Aortic dilatation noted. There is moderate dilatation of the aortic root, measuring 46 mm.  8. The inferior vena cava is normal in size with greater than 50% respiratory variability, suggesting right atrial pressure of 3 mmHg. Comparison(s): No significant change from prior study. Prior images reviewed side by side. Conclusion(s)/Recommendation(s): No evidence of valvular vegetations on this transthoracic echocardiogram. Would recommend a transesophageal echocardiogram to exclude infective endocarditis if clinically indicated. FINDINGS  Left Ventricle: Left ventricular ejection fraction, by estimation, is 25 to 30%. The left ventricle has severely decreased function. The left ventricle demonstrates global hypokinesis. The left ventricular internal cavity size was moderately dilated. There is no left ventricular hypertrophy. Left ventricular diastolic parameters are consistent with Grade III diastolic dysfunction (restrictive). Right Ventricle: The right ventricular size is mildly enlarged. No increase in right ventricular wall thickness. Right ventricular systolic function is mildly reduced. Left Atrium: Left atrial size was moderately dilated. Right Atrium: Right atrial size was moderately dilated. Pericardium: There is no evidence of pericardial effusion. Mitral Valve: The mitral valve is normal in structure. Moderate mitral valve regurgitation. No evidence of mitral valve stenosis. Tricuspid Valve: The tricuspid valve is normal in structure. Tricuspid valve regurgitation is trivial. No evidence of tricuspid stenosis. Aortic Valve: The aortic valve is normal in structure. Aortic valve regurgitation is not visualized. No aortic stenosis is present. Pulmonic Valve: The pulmonic valve was normal in structure. Pulmonic valve regurgitation is not visualized. No evidence of pulmonic stenosis. Aorta: Aortic  dilatation noted. There is moderate dilatation of the aortic root, measuring 46 mm. Venous: The inferior vena cava is normal in size with greater than 50% respiratory variability, suggesting right atrial pressure of 3 mmHg. IAS/Shunts: No atrial level shunt detected by color flow Doppler.  LEFT VENTRICLE PLAX 2D LVIDd:         6.90 cm      Diastology LVIDs:         6.10 cm      LV e' lateral:   3.92 cm/s LV PW:         1.10 cm      LV E/e' lateral: 1.0 LV IVS:        1.10 cm LVOT diam:     2.30 cm LV SV:         57 LV SV Index:   23 LVOT Area:     4.15 cm  LV Volumes (MOD) LV vol d, MOD A2C: 278.0 ml LV vol d, MOD A4C: 237.0 ml LV vol s, MOD A2C: 245.0 ml LV vol s, MOD A4C: 166.0 ml LV SV MOD A2C:     33.0 ml LV SV MOD A4C:     237.0 ml LV SV MOD BP:      53.2 ml RIGHT VENTRICLE RV S prime:     10.60 cm/s LEFT ATRIUM              Index        RIGHT ATRIUM           Index LA diam:        5.40 cm  2.15 cm/m   RA Area:     26.70 cm LA Vol (A2C):   131.0 ml 52.21 ml/m  RA Volume:   91.90 ml  36.63 ml/m LA Vol (A4C):   93.3 ml  37.19 ml/m LA Biplane Vol: 119.0 ml 47.43 ml/m  AORTIC VALVE LVOT Vmax:   67.90 cm/s LVOT Vmean:  50.800 cm/s LVOT VTI:    0.136 m  AORTA Ao Root diam: 4.45 cm Ao Asc diam:  3.20 cm MITRAL VALVE MV Area (PHT): 4.68 cm    SHUNTS MV Decel Time: 162 msec    Systemic VTI:  0.14 m MV E velocity: 4.05 cm/s   Systemic Diam: 2.30 cm MV A velocity: 29.40 cm/s MV E/A ratio:  0.14 Candee Furbish MD Electronically signed by Candee Furbish MD Signature Date/Time: 07/21/2021/11:19:51 AM    Final    Disposition   Pt is being discharged home today in good condition.  Follow-up Plans & Appointments     Follow-up Information     [facility]. Go to.   Specialty: Cardiology Why: Friday, 10/14 @ 9AM for HV TOC appt within [place].  Bring your medications with you. FREE valet parking at [employer], off [address].  Parking garade code is [phone]  if needed. Contact information: [address] [phone] [place], [place], PA-C Follow up.   Specialties: Physician Assistant, Cardiology Why: [facility] [place] location at [facility] - keep follow-up as scheduled on Friday Aug 11, 2021 3:30 PM (Arrive by 3:15 PM). Contact information: [place] [address] [phone]         [facility] Follow up.   Specialty: Cardiology Why: We have arranged for you to have a visit with the Advanced Heart Failure clinic on Monday Aug 14, 2021 at 11:30 AM. Please arrive 15 minutes early to check in. This is in the same location as your transition-of-care appointment on 10/14. Contact information: [address] [phone] [place] [phone]        Additional Follow-up Follow up.   Why: The cardiology office will call you to arrange specialty consultation with one of our electrophysiologists given your heart rhythm abnormalities. We will also call you to arrange an MRI scan of your heart.               Discharge Instructions     Diet - low sodium heart healthy   Complete by: As directed    Discharge instructions   Complete by: As directed    Please note several medicine changes/instructions - You can restart your metformin TOMORROW. - Lisinopril was stopped because we are changing to [facility]. Pantoprazole and promethazine-dextromethorphan were removed from your medicine list since you indicated you  no longer take these. - Your Delene Loll has been restarted. - Your carvedilol and isosorbide prescriptions have changed (new doses).   Increase activity slowly   Complete by: As directed    No driving for 2 days. No lifting over 5 lbs for 1 week. No sexual activity for 1 week. You may not return to work until cleared at follow-up visit - please discuss at  transition of care clinic visit. Keep procedure site clean & dry. If you notice increased pain, swelling, bleeding or pus, call/return!  You may shower, but no soaking baths/hot tubs/pools for 1 week.       Discharge Medications   Allergies as of 07/21/2021   No Known Allergies      Medication List     STOP taking these medications    lisinopril 40 MG tablet Commonly known as: ZESTRIL   pantoprazole 40 MG tablet Commonly known as: PROTONIX   promethazine-dextromethorphan  6.25-15 MG/5ML syrup Commonly known as: PROMETHAZINE-DM       TAKE these medications    aspirin 81 MG chewable tablet Chew 81 mg by mouth in the morning.   carvedilol 12.5 MG tablet Commonly known as: COREG Take 1 tablet (12.5 mg total) by mouth 2 (two) times daily. What changed:  medication strength how much to take   Entresto 24-26 MG Generic drug: sacubitril-valsartan Take 1 tablet by mouth 2 (two) times daily.   furosemide 40 MG tablet Commonly known as: LASIX TAKE 1 TABLET (40 MG TOTAL) BY MOUTH EVERY MORNING.   isosorbide mononitrate 60 MG 24 hr tablet Commonly known as: IMDUR Take 1 tablet (60 mg total) by mouth daily. What changed:  medication strength how much to take   metFORMIN 500 MG tablet Commonly known as: GLUCOPHAGE Take 500 mg by mouth 2 (two) times daily with a meal. Notes to patient: YOU MAY RESTART THIS TOMORROW 07/22/21   spironolactone 25 MG tablet Commonly known as: ALDACTONE TAKE 1 TABLET (25 MG TOTAL) BY MOUTH DAILY.           Outstanding Labs/Studies   Cardiac MRI Otherwise f/u as outlined above  Duration of Discharge Encounter   Greater than 30 minutes including physician time.  Signed, Charlie Pitter, PA-C 07/21/2021, 4:19 PM

## 2021-07-21 NOTE — Progress Notes (Signed)
Cardiac MRI order

## 2021-07-21 NOTE — Progress Notes (Signed)
Heart Failure Nurse Navigator Progress Note  PCP: James Fire, MD PCP-Cardiologist: James James., MD Admission Diagnosis: A/C CHF Admitted from: home with spouse  Presentation:   James [name] presented 10/5 with increased SOB. Pt sitting in recliner with legs elevated on room air. Pt interactive with interview process. Pt states he quit smoking the morning of admission, plans to continue cessation-encouraged. Lives with his spouse and has 60 yo step son. 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 Pt states he has had non-compliance with medications in the past, some due to cost. Pt has BCBS, Navigator gave $10 co-pay card for [employer]. Explained benefit of HV TOC appt, pt agreeable. Scheduled HV TOC 10/14 @ 9AM per reqeuest. Pt has scheduled appt with Dr. Johnsie [name] office 10/27.   ECHO/ LVEF: 25-30%, G3DD.   Clinical Course:  Past Medical History:  Diagnosis Date   Arteriosclerotic cardiovascular disease (ASCVD)    Insignificant disease in 2002 and 2013   Cardiomyopathy [facility])    CHF and EF of 25% in 02/2012; normal TSH   Diabetes mellitus without complication (HCC)    Hypercholesteremia    Hypertension    Myocardial infarction ([place])    2000 MI   Obstructive sleep apnea    was positive but never given a CPAP   Tobacco abuse      Social History   Socioeconomic History   Marital status: Married    Spouse name: James [name]   Number of children: 4   Years of education: Not on file   Highest education level: Associate degree: occupational, Hotel manager, or vocational program  Occupational History   Occupation: Librarian, academic    Comment: [employer]  Tobacco Use   Smoking status: Former    Packs/day: 0.50    Years: 32.00    Pack years: 16.00    Types: Cigarettes    Start date: 10/15/1982    Quit date: 07/18/2021   Smokeless tobacco: Never   Tobacco comments:    3 cigaretts a day  Vaping Use   Vaping Use: Never used  Substance and Sexual Activity   Alcohol use:  No    Alcohol/week: 0.0 standard drinks   Drug use: No   Sexual activity: Yes    Birth control/protection: None  Other Topics Concern   Not on file  Social History Narrative   Divorced   No regular exercise   Social Determinants of Health   Financial Resource Strain: Low Risk    Difficulty of Paying Living Expenses: Not very hard  Food Insecurity: No Food Insecurity   Worried About Charity fundraiser in the Last Year: Never true   Ran Out of Food in the Last Year: Never true  Transportation Needs: No Transportation Needs   Lack of Transportation (Medical): No   Lack of Transportation (Non-Medical): No  Physical Activity: Not on file  Stress: Not on file  Social Connections: Not on file    High Risk Criteria for Readmission and/or Poor Patient Outcomes: Heart failure hospital admissions (last 6 months): 1 No Show rate: 8% Difficult social situation: no Demonstrates medication adherence: no Primary Language: English Literacy level: Able to read/write and comprehend.  Education Assessment and Provision:  Detailed education and instructions provided on heart failure disease management including the following:  Signs and symptoms of Heart Failure When to call the physician Importance of daily weights Low sodium diet Fluid restriction Medication management Anticipated future follow-up appointments  Patient education given on each of the  above topics.  Patient acknowledges understanding via teach back method and acceptance of all instructions.  Education Materials:  "Living Better With Heart Failure" Booklet, HF zone tool, & Daily Weight Tracker Tool.  Patient has scale at home: yes Patient has pill box at home: yes   Barriers of Care:   -medication cost  Considerations/Referrals:   Referral made to Heart Failure Pharmacist Stewardship: yes, appreciated Referral made to Heart Failure CSW/NCM TOC: no Referral made to Heart & Vascular TOC clinic: yes, 10/14 @  9AM  Items for Follow-up on DC/TOC: -optimize -medication cost -cont HF education: reinforce fluid restrictions.   Pricilla Holm, MSN, RN Heart Failure Nurse Navigator [phone]

## 2021-07-21 NOTE — Progress Notes (Addendum)
Progress Note  Patient Name: James [name] Date of Encounter: 07/21/2021  Primary Cardiologist: Jenkins Rouge, MD  Subjective   Feeling well today. Still having short bursts NSVT - asymptomatic with these. Denies hx of recent syncope - had episode long time ago in 2002. Reports multiple family members with similar heart issues as he does but does not know further details of this. Swelling much improved.  Inpatient Medications    Scheduled Meds:  aspirin  81 mg Oral q AM   carvedilol  12.5 mg Oral BID   enoxaparin (LOVENOX) injection  40 mg Subcutaneous Q24H   furosemide  40 mg Oral Daily   insulin aspart  0-9 Units Subcutaneous TID WC   isosorbide mononitrate  60 mg Oral Daily   sacubitril-valsartan  1 tablet Oral BID   sodium chloride flush  3 mL Intravenous Q12H   spironolactone  25 mg Oral Daily   Continuous Infusions:  sodium chloride     PRN Meds: sodium chloride, acetaminophen, ondansetron (ZOFRAN) IV, sodium chloride flush   Vital Signs    Vitals:   07/20/21 2118 07/21/21 0419 07/21/21 0801 07/21/21 0830  BP: 118/75 (!) 138/96 135/90 (!) 127/97  Pulse: 62 64 60 65  Resp: 18 18 16    Temp: 98.1 F (36.7 C) 98.8 F (37.1 C) 98.1 F (36.7 C)   TempSrc: Oral Oral Oral   SpO2:  98% 97% 99%  Weight:  116.3 kg    Height:  6\' 6"  (1.981 m)      Intake/Output Summary (Last 24 hours) at 07/21/2021 1051 Last data filed at 07/21/2021 0814 Gross per 24 hour  Intake 480 ml  Output 550 ml  Net -70 ml   Last 3 Weights 07/21/2021 07/19/2021 07/14/2021  Weight (lbs) 256 lb 6.4 oz 270 lb 279 lb  Weight (kg) 116.302 kg 122.471 kg 126.554 kg  Some encounter information is confidential and restricted. Go to Review Flowsheets activity to see all data.     Telemetry    NSR, occasional PVCs, short bursts NSVT I.e. 8-12 beats - Personally Reviewed  Physical Exam   GEN: No acute distress.  HEENT: Normocephalic, atraumatic, sclera non-icteric. Neck: No JVD or  bruits. Cardiac: RRR no murmurs, rubs, or gallops.  Respiratory: Clear to auscultation bilaterally. Breathing is unlabored. GI: Soft, nontender, non-distended, BS +x 4. MS: no deformity. Extremities: No clubbing or cyanosis. No edema. Distal pedal pulses are 2+ and equal bilaterally. Right radial cath site without hematoma or ecchymosis; good pulse. Neuro:  AAOx3. Follows commands. Psych:  Responds to questions appropriately with a normal affect.  Labs    High Sensitivity Troponin:  No results for input(s): TROPONINIHS in the last 720 hours.    Cardiac EnzymesNo results for input(s): TROPONINI in the last 168 hours. No results for input(s): TROPIPOC in the last 168 hours.   Chemistry Recent Labs  Lab 07/14/21 1449 07/19/21 1305 07/19/21 1711 07/20/21 0925  NA 141 141  143  --  139  K 3.6 3.9  3.5  --  3.7  CL 106  --   --  106  CO2 29  --   --  25  GLUCOSE 112*  --   --  128*  BUN 21*  --   --  19  CREATININE 1.46*  --  1.20 1.25*  CALCIUM 8.0*  --   --  8.8*  GFRNONAA 55*  --  >60 >60  ANIONGAP 6  --   --  8  Hematology Recent Labs  Lab 07/14/21 1447 07/19/21 1305 07/19/21 1711  WBC 8.6  --  7.4  RBC 4.60  --  4.93  HGB 14.7 15.3  14.3 15.7  HCT 45.0 45.0  42.0 47.2  MCV 97.8  --  95.7  MCH 32.0  --  31.8  MCHC 32.7  --  33.3  RDW 14.2  --  14.2  PLT 156  --  170    BNPNo results for input(s): BNP, PROBNP in the last 168 hours.   DDimer No results for input(s): DDIMER in the last 168 hours.   Radiology    CARDIAC CATHETERIZATION  Result Date: 26/04/1244   LV end diastolic pressure is severely elevated.   Hemodynamic findings consistent with severe pulmonary hypertension. No angiographic evidence of CAD Elevated right and left heart pressures (LV: 130/22/39, RA 15, RV 82/13/20, PA 83/30 mean 49, PCWP 35). Recommendations: Will admit to telemetry for diuresis with IV Lasix given ongoing dyspnea, volume overload and severely elevated filling pressures.     Cardiac Studies   2D Echo 04/2021  1. LVEF is severely depressed with diffuse hypokinesis; apical akinesis.  Global longitudincal strain is -8.5%. Left ventricular ejection fraction,  by estimation, is 25%%. The left ventricle has severely decreased  function. The left ventricle  demonstrates global hypokinesis. The left ventricular internal cavity size  was severely dilated. There is mild left ventricular hypertrophy. Left  ventricular diastolic parameters are consistent with Grade III diastolic  dysfunction (restrictive). Elevated   left atrial pressure.   2. Right ventricular systolic function is low normal. The right  ventricular size is normal.   3. Left atrial size was mildly dilated.   4. The mitral valve is normal in structure. Mild mitral valve  regurgitation.   5. The aortic valve is tricuspid. Aortic valve regurgitation is not  visualized. Mild aortic valve sclerosis is present, with no evidence of  aortic valve stenosis.   6. Aortic dilatation noted. There is moderate dilatation of the aortic  root, measuring 46 mm.   Comparison(s): The left ventricular function is worsened.    NST 06/2021     Findings are consistent with prior anteroseptal and inferolateral myocardial infarction. There is no current ischemia. The study is high risk. Risk based on scar and decreased LVEF, consider correlating LVEF with echo.   No ST deviation was noted. Converted to pharmacological stress test due to frequent ventricular ectopy with exercise. PVCs, bigeminy, and accelerated idioventricular rhythm noted.   LV perfusion is abnormal.   Left ventricular function is abnormal. Nuclear stress EF: 22 %. The left ventricular ejection fraction is severely decreased (<30%). End systolic cavity size is severely enlarged.  R/LHC 80/9/98   LV end diastolic pressure is severely elevated.   Hemodynamic findings consistent with severe pulmonary hypertension.   No angiographic evidence of  CAD Elevated right and left heart pressures (LV: 130/22/39, RA 15, RV 82/13/20, PA 83/30 mean 49, PCWP 35).    Recommendations: Will admit to telemetry for diuresis with IV Lasix given ongoing dyspnea, volume overload and severely elevated filling pressures.   Patient Profile     60 y.o. male with HTN, NICM, chronic combined CHF, tobacco abuse, OSA, HLD, type 2 DM. Recently seen in office 05/2021 to follow up echo from 04/2021 showing further decline in LVEF. Coronary CTA was planned but could not due because of irregular heartbeat - has had PVCs noted. Underwent nuc that was abnormal - with frequent ventricular ectopy with exercise. PVCs,  bigeminy, and accelerated idioventricular rhythm noted. Due to findings, was admitted for [facility] 07/19/21 showing no angiographic evidence of CAD, but severely elevated LVEDP and severe pulmonary HTN prompting admission.  Assessment & Plan    1. Acute on chronic combined CHF/NICM - has historical LV dysfunction but EF decreased by echo 04/2021 to 25% - etiology of cardiomyopathy not clear at this time - frequent ectopy noted so could be PVC mediated, but will discuss with MD whether cardiac MRI should be considered given his ectopy and NICM - med changes this admission include increase in carvedilol from 6.25mg  to 12.5mg  BID, decrease in Imdur from 120mg  to 60mg  daily, change from lisinopril to Entresto (to begin tonight), Lasix, and continuation of spironolactone - agree with transition to oral Lasix today - volume looks good -rx CHF book - have asked TOC HF impact team to see to address cost concerns (I.e. Entresto, +/- possible addition of SGLT2i) - has repeat echo pending per orders  2. Frequent PVCs, NSVT - continue carvedilol - dose increased yesterday, cannot titrate further due to baseline sinus bradycardia - will discuss with MD whether EP consultation would be indicated given NICM and persistent NSVT - f/u BMET/Mg today, anticipate supplementing K if  needed - check baseline TSH  3. Severe pulmonary HTN by cath - may be in context of volume overload but if this finding persists will need additional workup - repeat echo planned today per orders  4. Essential HTN - managed in context above  5. CKD stage II-IIIa - Cr appears at baseline, which is 1.2-1.4 lately  6. OSA - patient denies ever using CPAP at home, last sleep study 2017 therefore would consider setting up for repeat assessment as OP  7. Tobacco abuse - cessation advised - consider outpatient PFTs for COPD evaluation per prior notes  8. Type 2DM - metformin remains on hold post-cath, consider resuming in AM - continue SSI   For questions or updates, please contact [facility] Please consult [email or web address] for contact info under Cardiology/STEMI.  Signed, Charlie Pitter, PA-C 07/21/2021, 10:51 AM     Attending Addendum:   History and all data above reviewed.  Patient examined.  I agree with the findings as above.  All available labs, radiology testing, previous records reviewed. Agree with documented assessment and plan. Mr. Xiang is a 41M with NICM, hypertension, OSA, hyperlipidemia, diabetes, and tobacco abuse admitted with acute on chronic systolic and diastolic heart failure s/p LHC/RHC.  Cath revealed no CAD with markedly elevated L and R sided filling pressures.  In discussion with him today, he notes that his mother, father, and maternal grandfather all had heart failure.  His grandfather died of heart failure at age 77.  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  He will need an outpatient cardiac MRI and recommend that he see our advanced heart failure team.  We will also set him up to see our genetic counselor.  He has 3 adult children.  Recommended that they get an echocardiogram and consider genetic testing based on the results of his genetic test.  He is feeling much better and is euvolemic on exam.  He does continue to have runs of NSVT.  Carvedilol has  been increased.  Given that he is nonischemic, do not feel strongly about him getting a LifeVest.  LVEF was 25 to 30% this admission with grade 3 diastolic dysfunction.  This was essentially unchanged from 04/2021.  He continues to have moderate aortic aneurysm at 4.6  cm.  He was switched from lisinopril to [facility] this admission.  We will also get him on an SGLT2 inhibitor.  He will need to see EP as an outpatient for consideration of defibrillator.  After increasing carvedilol his blood pressure is well Controlled.  Therefore we will reduce the Entresto dose to 24/26 mg instead of 49/51.  We will also reduce the spironolactone to 12.5 mg daily.  He will have close follow-up with the [facility] clinic as well as general cardiology.  He will need a BMP within a week.  Jacci Ruberg C. Oval Linsey, MD, [facility]  07/21/2021 12:00 PM

## 2021-07-21 NOTE — Progress Notes (Signed)
2D echocardiogram complete

## 2021-07-21 NOTE — Progress Notes (Signed)
Pt had a 12-beat-run of V-tach, asymptomatic with no pain or shortness or breath. Will continue to monitor.   Elaina Hoops, RN

## 2021-07-28 ENCOUNTER — Encounter (HOSPITAL_COMMUNITY): Payer: Self-pay

## 2021-07-28 ENCOUNTER — Ambulatory Visit (HOSPITAL_COMMUNITY)
Admit: 2021-07-28 | Discharge: 2021-07-28 | Disposition: A | Discharge status: Home / Self Care | Payer: BC Managed Care – PPO | Source: Ambulatory Visit | Attending: Cardiology | Admitting: Cardiology

## 2021-07-28 ENCOUNTER — Other Ambulatory Visit (HOSPITAL_COMMUNITY): Payer: Self-pay

## 2021-07-28 ENCOUNTER — Other Ambulatory Visit: Payer: Self-pay

## 2021-07-28 VITALS — BP 164/102 | HR 65 | Ht 78.0 in | Wt 258.0 lb

## 2021-07-28 DIAGNOSIS — E1122 Type 2 diabetes mellitus with diabetic chronic kidney disease: Secondary | ICD-10-CM | POA: Insufficient documentation

## 2021-07-28 DIAGNOSIS — I428 Other cardiomyopathies: Secondary | ICD-10-CM | POA: Diagnosis not present

## 2021-07-28 DIAGNOSIS — N182 Chronic kidney disease, stage 2 (mild): Secondary | ICD-10-CM | POA: Diagnosis not present

## 2021-07-28 DIAGNOSIS — Z7984 Long term (current) use of oral hypoglycemic drugs: Secondary | ICD-10-CM | POA: Insufficient documentation

## 2021-07-28 DIAGNOSIS — Z79899 Other long term (current) drug therapy: Secondary | ICD-10-CM | POA: Insufficient documentation

## 2021-07-28 DIAGNOSIS — Z8249 Family history of ischemic heart disease and other diseases of the circulatory system: Secondary | ICD-10-CM | POA: Diagnosis not present

## 2021-07-28 DIAGNOSIS — I5022 Chronic systolic (congestive) heart failure: Secondary | ICD-10-CM

## 2021-07-28 DIAGNOSIS — G4733 Obstructive sleep apnea (adult) (pediatric): Secondary | ICD-10-CM | POA: Diagnosis not present

## 2021-07-28 DIAGNOSIS — I272 Pulmonary hypertension, unspecified: Secondary | ICD-10-CM | POA: Diagnosis not present

## 2021-07-28 DIAGNOSIS — I493 Ventricular premature depolarization: Secondary | ICD-10-CM | POA: Insufficient documentation

## 2021-07-28 DIAGNOSIS — I130 Hypertensive heart and chronic kidney disease with heart failure and stage 1 through stage 4 chronic kidney disease, or unspecified chronic kidney disease: Secondary | ICD-10-CM | POA: Insufficient documentation

## 2021-07-28 LAB — BASIC METABOLIC PANEL
Anion gap: 6 (ref 5–15)
BUN: 19 mg/dL (ref 6–20)
CO2: 25 mmol/L (ref 22–32)
Calcium: 8.9 mg/dL (ref 8.9–10.3)
Chloride: 105 mmol/L (ref 98–111)
Creatinine, Ser: 1.31 mg/dL — ABNORMAL HIGH (ref 0.61–1.24)
GFR, Estimated: >60 mL/min (ref >60)
Glucose, Bld: 130 mg/dL — ABNORMAL HIGH (ref 70–99)
Potassium: 4.3 mmol/L (ref 3.5–5.1)
Sodium: 136 mmol/L (ref 135–145)

## 2021-07-28 MED ORDER — FUROSEMIDE 40 MG PO TABS: 40.0000 mg | ORAL_TABLET | ORAL | 3 refills | Status: DC

## 2021-07-28 MED ORDER — DAPAGLIFLOZIN PROPANEDIOL 10 MG PO TABS: 10.0000 mg | ORAL_TABLET | Freq: Every day | ORAL | 11 refills | Status: DC

## 2021-07-28 MED ORDER — ISOSORBIDE MONONITRATE ER 60 MG PO TB24
60.0000 mg | ORAL_TABLET | Freq: Every day | ORAL | 6 refills | Status: DC
Start: 2021-07-28 — End: 2021-11-29

## 2021-07-28 MED ORDER — ENTRESTO 49-51 MG PO TABS: 1.0000 | ORAL_TABLET | Freq: Two times a day (BID) | ORAL | 6 refills | Status: DC

## 2021-07-28 MED ORDER — SPIRONOLACTONE 25 MG PO TABS
25.0000 mg | ORAL_TABLET | Freq: Every day | ORAL | 6 refills | Status: DC
Start: 2021-07-28 — End: 2022-02-13

## 2021-07-28 MED ORDER — CARVEDILOL 12.5 MG PO TABS: 12.5000 mg | ORAL_TABLET | Freq: Two times a day (BID) | ORAL | 6 refills | Status: DC

## 2021-07-28 NOTE — Progress Notes (Signed)
HEART & VASCULAR TRANSITION OF CARE CONSULT NOTE     Referring Physician: Dr. Oval Linsey  Primary Care: Rosita Fire, MD  Primary Cardiologist: Jenkins Rouge, MD   HPI: Referred to clinic by Dr. Oval Linsey for heart failure consultation.   60 y/o AA male w/ chronic systolic heart failure 2/2 NICM, HTN, T2DM, family h/o CHF (mother and father), tobacco use and untreated OSA. HF dates back to 2011. Echo then showed mildly reduced LVEF 45-50%. In 2013, EF dropped to 20-25%. Subsequent LHC showed no CAD. He had been followed by Dr. Lattie Haw but lost to f/u for several years. Recently referred back to cardiology and established care w/ Dr. Johnsie Cancel. Echo was repeated and showed LVEF 25-30% w GIII DDD (restrictive), RV mildly reduced, mod BAE, mod MR. + Aortic root dilation, measuring 46 mm. He was set up for outpatient R/LHC.   Cath on 07/19/21 showed no CAD, but elevated right and left sided filling pressures (LV: 130/22/39, RA 15, RV 82/13/20, PA 83/30 mean 49, PCWP 35, CI marginal at 2.15). He was admitted for IV diuresis. He was diuresed w/ IV lasix and placed on GDMT. Hospitalization was also notable for frequent PVCs. ? blocker was titrated.  Referred to [facility] clinic for post hospital HF management. Also has been referred to EP. Scheduled to see Dr. Lovena Le on 10/25 for PVCs and possible ICD. cMRI also recommended and scheduled for 11/9.   Today in f/u, reports gradual wt gain post d/c. Up ~8 lb on his home scale. Also w/ NYHA Class II symptoms + orthopnea. No PND or LEE. Reports full med compliance. BP elevated 164/102 in clinic but reports better controlled at home. Reports he was diagnosed w/ OSA many years ago but does not have CPAP. Long term smoker and recently quit after his recent admission. Denies ETOH use.     Cardiac Testing   2D Echo 04/2021  1. LVEF is severely depressed with diffuse hypokinesis; apical akinesis.  Global longitudincal strain is -8.5%. Left ventricular ejection  fraction,  by estimation, is 25%%. The left ventricle has severely decreased  function. The left ventricle  demonstrates global hypokinesis. The left ventricular internal cavity size  was severely dilated. There is mild left ventricular hypertrophy. Left  ventricular diastolic parameters are consistent with Grade III diastolic  dysfunction (restrictive). Elevated   left atrial pressure.   2. Right ventricular systolic function is low normal. The right  ventricular size is normal.   3. Left atrial size was mildly dilated.   4. The mitral valve is normal in structure. Mild mitral valve  regurgitation.   5. The aortic valve is tricuspid. Aortic valve regurgitation is not  visualized. Mild aortic valve sclerosis is present, with no evidence of  aortic valve stenosis.   6. Aortic dilatation noted. There is moderate dilatation of the aortic  root, measuring 46 mm.   Comparison(s): The left ventricular function is worsened.      R/LHC 19/6/22   LV end diastolic pressure is severely elevated.   Hemodynamic findings consistent with severe pulmonary hypertension.   No angiographic evidence of CAD Elevated right and left heart pressures (LV: 130/22/39, RA 15, RV 82/13/20, PA 83/30 mean 49, PCWP 35).  Marginal CO. CI 2.15       Review of Systems: [y] = yes, [ ]  = no   General: Weight gain [ Y]; Weight loss [ ] ; Anorexia [ ] ; Fatigue [ ] ; Fever [ ] ; Chills [ ] ; Weakness [ ]   Cardiac:  Chest pain/pressure [ ] ; Resting SOB [ ] ; Exertional SOB [ Y]; Orthopnea [ Y]; Pedal Edema [ ] ; Palpitations [ ] ; Syncope [ ] ; Presyncope [ ] ; Paroxysmal nocturnal dyspnea[ ]   Pulmonary: Cough [ ] ; Wheezing[ ] ; Hemoptysis[ ] ; Sputum [ ] ; Snoring [ ]   GI: Vomiting[ ] ; Dysphagia[ ] ; Melena[ ] ; Hematochezia [ ] ; Heartburn[ ] ; Abdominal pain [ ] ; Constipation [ ] ; Diarrhea [ ] ; BRBPR [ ]   GU: Hematuria[ ] ; Dysuria [ ] ; Nocturia[ ]   Vascular: Pain in legs with walking [ ] ; Pain in feet with lying flat [ ] ;  Non-healing sores [ ] ; Stroke [ ] ; TIA [ ] ; Slurred speech [ ] ;  Neuro: Headaches[ ] ; Vertigo[ ] ; Seizures[ ] ; Paresthesias[ ] ;Blurred vision [ ] ; Diplopia [ ] ; Vision changes [ ]   Ortho/Skin: Arthritis [ ] ; Joint pain [ ] ; Muscle pain [ ] ; Joint swelling [ ] ; Back Pain [ ] ; Rash [ ]   Psych: Depression[ ] ; Anxiety[ ]   Heme: Bleeding problems [ ] ; Clotting disorders [ ] ; Anemia [ ]   Endocrine: Diabetes [ ] ; Thyroid dysfunction[ ]    Past Medical History:  Diagnosis Date   CKD (chronic kidney disease) stage 2, GFR 60-89 ml/min    Diabetes mellitus type II, non insulin dependent (HCC)    Dilated aortic root (HCC)    Hypercholesteremia    Hypertension    NICM (nonischemic cardiomyopathy) (HCC)    NSVT (nonsustained ventricular tachycardia)    Obstructive sleep apnea    was positive but never given a CPAP   Pulmonary hypertension (HCC)    PVC's (premature ventricular contractions)    Tobacco abuse     Current Outpatient Medications  Medication Sig Dispense Refill   aspirin 81 MG chewable tablet Chew 81 mg by mouth in the morning.     carvedilol (COREG) 12.5 MG tablet Take 1 tablet (12.5 mg total) by mouth 2 (two) times daily. 60 tablet 6   furosemide (LASIX) 40 MG tablet TAKE 1 TABLET (40 MG TOTAL) BY MOUTH EVERY MORNING. 30 tablet 6   isosorbide mononitrate (IMDUR) 60 MG 24 hr tablet Take 1 tablet (60 mg total) by mouth daily. 30 tablet 6   metFORMIN (GLUCOPHAGE) 500 MG tablet Take 500 mg by mouth 2 (two) times daily with a meal.      sacubitril-valsartan (ENTRESTO) 24-26 MG Take 1 tablet by mouth 2 (two) times daily. 60 tablet 6   spironolactone (ALDACTONE) 25 MG tablet TAKE 1 TABLET (25 MG TOTAL) BY MOUTH DAILY. 30 tablet 1   No current facility-administered medications for this encounter.    No Known Allergies    Social History   Socioeconomic History   Marital status: Married    Spouse name: Ren Aspinall   Number of children: 4   Years of education: Not on file    Highest education level: Associate degree: occupational, Hotel manager, or vocational program  Occupational History   Occupation: supervisor    Comment: [employer]  Tobacco Use   Smoking status: Former    Packs/day: 0.50    Years: 32.00    Pack years: 16.00    Types: Cigarettes    Start date: 10/15/1982    Quit date: 07/18/2021    Years since quitting: 0.0   Smokeless tobacco: Never   Tobacco comments:    3 cigaretts a day  Vaping Use   Vaping Use: Never used  Substance and Sexual Activity   Alcohol use: No    Alcohol/week: 0.0 standard drinks   Drug use:  No   Sexual activity: Yes    Birth control/protection: None  Other Topics Concern   Not on file  Social History Narrative   Divorced   No regular exercise   Social Determinants of Health   Financial Resource Strain: Low Risk    Difficulty of Paying Living Expenses: Not very hard  Food Insecurity: No Food Insecurity   Worried About Charity fundraiser in the Last Year: Never true   Ran Out of Food in the Last Year: Never true  Transportation Needs: No Transportation Needs   Lack of Transportation (Medical): No   Lack of Transportation (Non-Medical): No  Physical Activity: Not on file  Stress: Not on file  Social Connections: Not on file  Intimate Partner Violence: Not on file      Family History  Problem Relation Age of Onset   Heart attack Father    Hypertension Father    Iron deficiency Father    Coronary artery disease Mother    Heart disease Maternal Grandfather        Also paternal grandfather   Cardiomyopathy Brother        No significant coronary disease by coronary angiography   Thyroid disease Sister    Sudden death Other    Colon cancer Neg Hx     Vitals:   07/28/21 0910  BP: (!) 164/102  Pulse: 65  SpO2: 99%  Weight: 117 kg  Height: 6\' 6"  (1.981 m)    PHYSICAL EXAM: General:  Well appearing. No respiratory difficulty HEENT: normal Neck: supple. JVD 8 cm. Carotids 2+ bilat; no bruits. No  lymphadenopathy or thryomegaly appreciated. Cor: PMI nondisplaced. Regular rate & rhythm. No rubs, gallops or murmurs. Lungs: clear Abdomen: soft, nontender, nondistended. No hepatosplenomegaly. No bruits or masses. Good bowel sounds. Extremities: no cyanosis, clubbing, rash, edema Neuro: alert & oriented x 3, cranial nerves grossly intact. moves all 4 extremities w/o difficulty. Affect pleasant.  ECG: not performed today. EKG 07/19/21 reviewed. NSR 65 bpm, possible LAE.    ASSESSMENT & PLAN:  1. Chronic Systolic Heart Failure - NICM. Dates back >10 years. EF 45-50% in 2011, dropped to 25% in 2013 w/ cath showing no CAD - Recent Echo 10/22: EF 25-30% w/ GIIIDD, RV mildly reduced  - LHC 10/22 no CAD . RHC elevated R&L filling pressures and marginal output w/ CI 2.15  - ? Familial CM vs Infiltrative CM given restrictive pattern on echo. Agree w/ cMRI (scheduled 11/9) - ? Tachymediated given frequent PVCs. Has consult w/ EP. Consider Zio to quantify burden.  - May be HTN CM. Needs better BP control and better diuresis. Mildly fluid overloaded on exam w/ NYHA Class II symptoms. Continue GDMT titration  - Increase Entresto to 49-51 mg bid - Add Farxiga 10 mg daily (recent Hgb A1c 5.9) - Continue Spiro 25 mg daily  - Continue Coreg 12.5 mg bid. Hold titration given marginal CI on recent cath.  - Continue Imdur 60 mg daily  - Reduce Lasix from daily to QOD - BMP today and again in 7 days  - Would be a candidate for Advanced Therapies but needs to continues to refrain from tobacco use. Will refer to the [facility].   2. PVCs - noted recent hospitalization - on ? blocker - Has EP consult w/ Dr. Lovena Le on 10/25 - He will decide on further w/u management. ? Zio monitor  - Needs treatment of OSA. Suspect contributing factor   3. Hypertension  - Elevated, titrate GDMT  per above - suspect untreated OSA is contributing. He will be referred to the [facility] Will need sleep study ordered.    4. Pulmonary  HTN - RHC 10/5 w/ elevated PAP 83/30, mPAP 49. 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 Type II DM - controlled w/ Metformin - Adding Farxiga for HFrEF per above   6. OSA - Sleep study 2013 w/ Mod OSA. Not on CPAP - suspect contributing to HTN, PVCs and HF - needs treatment w/ CPAP. Likely needs repeat study, see above.   Refer to [facility] for further w/u and management of chronic systolic heart failure and pulmonary HTN.     NYHA II GDMT  Diuretic- Lasix 40 mg daily>>reduce to QOD BB- Coreg 12.5 mg bid  Ace/ARB/ARNI Entresto 24-26>>increase to 49-51 mg bid  MRA Spiro 25 mg daily  SGLT2i - Add Farxiga 10 mg daily     Referred to HFSW (PCP, Medications, Transportation, ETOH Abuse, Drug Abuse, Insurance, Financial ): or No Refer to Pharmacy No Refer to Home Health:  No Refer to Advanced Heart Failure Clinic: Yes  Refer to General Cardiology: Currently followed by Dr. Johnsie Cancel   Follow up: Will arrange New Patient Appt in the [facility]

## 2021-07-28 NOTE — Patient Instructions (Signed)
START Farxiga 10 mg, one tab daily INCREASE Entresto to 49/51 mg, one tab twice a day CHANGE Lasix to 40 mg, one tab every other day  Labs today We will only contact you if something comes back abnormal or we need to make some changes. Otherwise no news is good news!  Labs needed in 7-10 days  Keep follow up in 3 weeks  Do the following things EVERYDAY: Weigh yourself in the morning before breakfast. Write it down and keep it in a log. Take your medicines as prescribed Eat low salt foods--Limit salt (sodium) to 2000 mg per day.  Stay as active as you can everyday Limit all fluids for the day to less than 2 liters  At the [facility], you and your health needs are our priority. As part of our continuing mission to provide you with exceptional heart care, we have created designated Provider Care Teams. These Care Teams include your primary Cardiologist (physician) and Advanced Practice Providers (APPs- Physician Assistants and Nurse Practitioners) who all work together to provide you with the care you need, when you need it.   You may see any of the following providers on your designated Care Team at your next follow up: Dr Glori Bickers Dr Loralie Champagne Dr Patrice Paradise, NP Lyda Jester, [place] Ginnie Smart Audry Riles, PharmD   Please be sure to bring in all your medications bottles to every appointment.   If you have any questions or concerns before your next appointment please send [name] a message through [place] or call our office at [phone].    TO LEAVE A MESSAGE FOR THE NURSE SELECT OPTION 2, PLEASE LEAVE A MESSAGE INCLUDING: YOUR NAME DATE OF BIRTH CALL BACK NUMBER REASON FOR CALL**this is important as we prioritize the call backs  YOU WILL RECEIVE A CALL BACK THE SAME DAY AS LONG AS YOU CALL BEFORE 4:00 PM

## 2021-08-08 ENCOUNTER — Other Ambulatory Visit (HOSPITAL_COMMUNITY)
Admission: RE | Admit: 2021-08-08 | Discharge: 2021-08-08 | Disposition: A | Discharge status: Home / Self Care | Payer: BC Managed Care – PPO | Source: Ambulatory Visit | Attending: Internal Medicine | Admitting: Internal Medicine

## 2021-08-08 ENCOUNTER — Other Ambulatory Visit: Payer: Self-pay

## 2021-08-08 ENCOUNTER — Ambulatory Visit (INDEPENDENT_AMBULATORY_CARE_PROVIDER_SITE_OTHER): Payer: BC Managed Care – PPO | Admitting: Internal Medicine

## 2021-08-08 ENCOUNTER — Encounter: Payer: Self-pay | Admitting: Internal Medicine

## 2021-08-08 ENCOUNTER — Encounter: Payer: Self-pay | Admitting: *Deleted

## 2021-08-08 VITALS — BP 118/78 | HR 64 | Ht 78.0 in | Wt 264.0 lb

## 2021-08-08 DIAGNOSIS — I5022 Chronic systolic (congestive) heart failure: Secondary | ICD-10-CM | POA: Diagnosis not present

## 2021-08-08 LAB — BASIC METABOLIC PANEL
Anion gap: 7 (ref 5–15)
BUN: 22 mg/dL — ABNORMAL HIGH (ref 6–20)
CO2: 26 mmol/L (ref 22–32)
Calcium: 8.2 mg/dL — ABNORMAL LOW (ref 8.9–10.3)
Chloride: 103 mmol/L (ref 98–111)
Creatinine, Ser: 1.34 mg/dL — ABNORMAL HIGH (ref 0.61–1.24)
GFR, Estimated: >60 mL/min (ref >60)
Glucose, Bld: 120 mg/dL — ABNORMAL HIGH (ref 70–99)
Potassium: 4.2 mmol/L (ref 3.5–5.1)
Sodium: 136 mmol/L (ref 135–145)

## 2021-08-08 LAB — CBC
HCT: 52.1 % — ABNORMAL HIGH (ref 39.0–52.0)
Hemoglobin: 17.4 g/dL — ABNORMAL HIGH (ref 13.0–17.0)
MCH: 31.6 pg (ref 26.0–34.0)
MCHC: 33.4 g/dL (ref 30.0–36.0)
MCV: 94.7 fL (ref 80.0–100.0)
Platelets: 178 10*3/uL (ref 150–400)
RBC: 5.5 MIL/uL (ref 4.22–5.81)
RDW: 13 % (ref 11.5–15.5)
WBC: 9 10*3/uL (ref 4.0–10.5)
nRBC: 0 % (ref 0.0–0.2)

## 2021-08-08 NOTE — H&P (View-Only) (Signed)
HPI [name] is referred by James [name] for evaluation of chronic systolic heart failure to consider ICD insertion. He is a pleasant 60 yo man with a h/o LV dysfunction dating back 8 years ago. He represented after a long absence with worsening CHF symptoms. He has been found to have severe LV dysfunction. He has  been on guideline directed medical therapy and a repeat echo showed an EF of 25%. He has not had syncope. He denies peripheral edema and his CHF symptoms are now class 2.  No Known Allergies   Current Outpatient Medications  Medication Sig Dispense Refill   aspirin 81 MG chewable tablet Chew 81 mg by mouth in the morning.     carvedilol (COREG) 12.5 MG tablet Take 1 tablet (12.5 mg total) by mouth 2 (two) times daily. 60 tablet 6   dapagliflozin propanediol (FARXIGA) 10 MG TABS tablet Take 1 tablet (10 mg total) by mouth daily before breakfast. 30 tablet 11   furosemide (LASIX) 40 MG tablet Take 1 tablet (40 mg total) by mouth every other day. 15 tablet 3   isosorbide mononitrate (IMDUR) 60 MG 24 hr tablet Take 1 tablet (60 mg total) by mouth daily. 30 tablet 6   metFORMIN (GLUCOPHAGE) 500 MG tablet Take 500 mg by mouth 2 (two) times daily with a meal.      sacubitril-valsartan (ENTRESTO) 49-51 MG Take 1 tablet by mouth 2 (two) times daily. 60 tablet 6   spironolactone (ALDACTONE) 25 MG tablet Take 1 tablet (25 mg total) by mouth daily. 30 tablet 6   No current facility-administered medications for this visit.     Past Medical History:  Diagnosis Date   CKD (chronic kidney disease) stage 2, GFR 60-89 ml/min    Diabetes mellitus type II, non insulin dependent (HCC)    Dilated aortic root (HCC)    Hypercholesteremia    Hypertension    NICM (nonischemic cardiomyopathy) (HCC)    NSVT (nonsustained ventricular tachycardia)    Obstructive sleep apnea    was positive but never given a CPAP   Pulmonary hypertension (HCC)    PVC's (premature ventricular contractions)     Tobacco abuse     ROS:   All systems reviewed and negative except as noted in the HPI.   Past Surgical History:  Procedure Laterality Date   CARDIAC CATHETERIZATION  2002   COLONOSCOPY N/A 03/22/2014   Procedure: COLONOSCOPY;  Surgeon: Danie Binder, MD;  [facility];  Service: Endoscopy;  Laterality: N/A;  11:15 AM   INGUINAL HERNIA REPAIR Right 06/24/2017   Procedure: HERNIA REPAIR INGUINAL ADULT WITH MESH;  Surgeon: Aviva Signs, MD;  Location: [facility];  Service: General;  Laterality: Right;   RIGHT/LEFT HEART CATH AND CORONARY ANGIOGRAPHY N/A 07/19/2021   Procedure: RIGHT/LEFT HEART CATH AND CORONARY ANGIOGRAPHY;  Surgeon: Burnell Blanks, MD;  Location: [facility];  Service: Cardiovascular;  Laterality: N/A;     Family History  Problem Relation Age of Onset   Heart attack Father    Hypertension Father    Iron deficiency Father    Coronary artery disease Mother    Heart disease Maternal Grandfather        Also paternal grandfather   Cardiomyopathy Brother        No significant coronary disease by coronary angiography   Thyroid disease Sister    Sudden death Other    Colon cancer Neg Hx      Social  History   Socioeconomic History   Marital status: Married    Spouse name: Darric Plante   Number of children: 4   Years of education: Not on file   Highest education level: Associate degree: occupational, Hotel manager, or vocational program  Occupational History   Occupation: Librarian, academic    Comment: [employer]  Tobacco Use   Smoking status: Former    Packs/day: 0.50    Years: 32.00    Pack years: 16.00    Types: Cigarettes    Start date: 10/15/1982    Quit date: 07/18/2021    Years since quitting: 0.0   Smokeless tobacco: Never   Tobacco comments:    3 cigaretts a day  Vaping Use   Vaping Use: Never used  Substance and Sexual Activity   Alcohol use: No    Alcohol/week: 0.0 standard drinks   Drug use: No   Sexual activity: Yes     Birth control/protection: None  Other Topics Concern   Not on file  Social History Narrative   Divorced   No regular exercise   Social Determinants of Health   Financial Resource Strain: Low Risk    Difficulty of Paying Living Expenses: Not very hard  Food Insecurity: No Food Insecurity   Worried About Charity fundraiser in the Last Year: Never true   Ran Out of Food in the Last Year: Never true  Transportation Needs: No Transportation Needs   Lack of Transportation (Medical): No   Lack of Transportation (Non-Medical): No  Physical Activity: Not on file  Stress: Not on file  Social Connections: Not on file  Intimate Partner Violence: Not on file     BP 118/78   Pulse 64   Ht 6\' 6"  (1.981 m)   Wt 264 lb (119.7 kg)   SpO2 99%   BMI 30.51 kg/m   Physical Exam:  Well appearing NAD HEENT: Unremarkable Neck:  No JVD, no thyromegally Lymphatics:  No adenopathy Back:  No CVA tenderness Lungs:  Clear with no wheezes HEART:  Regular rate rhythm, no murmurs, no rubs, no clicks Abd:  soft, positive bowel sounds, no organomegally, no rebound, no guarding Ext:  2 plus pulses, no edema, no cyanosis, no clubbing Skin:  No rashes no nodules Neuro:  CN II through XII intact, motor grossly intact  EKG - reviewed. NSR with occaisional PVC's  Assess/Plan:  Non-ischemic CM - he has not had an improvement in his LV function. I have recommended continued guideline directed medical therapy and ICD insertion for primary prevention. He will call us if he wishes to undergo ICD insertion.  HTN - his bp is now well controlled. He will contiue his current meds and maintain a low sodium diet.  Carleene Overlie Prestyn Mahn,MD

## 2021-08-08 NOTE — Patient Instructions (Signed)
Medication Instructions:  Your physician recommends that you continue on your current medications as directed. Please refer to the Current Medication list given to you today.  Dates are: November 7,11, 14, 23   *If you need a refill on your cardiac medications before your next appointment, please call your pharmacy*   Lab Work: Your physician recommends that you return for lab work in: 1 week prior to defibrillator placement   If you have labs (blood work) drawn today and your tests are completely normal, you will receive your results only by: Crystal (if you have MyChart) OR A paper copy in the mail If you have any lab test that is abnormal or we need to change your treatment, we will call you to review the results.   Testing/Procedures: Your physician has recommended that you have a defibrillator inserted. An implantable cardioverter defibrillator (ICD) is a small device that is placed in your chest or, in rare cases, your abdomen. This device uses electrical pulses or shocks to help control life-threatening, irregular heartbeats that could lead the heart to suddenly stop beating (sudden cardiac arrest). Leads are attached to the ICD that goes into your heart. This is done in the hospital and usually requires an overnight stay. Please see the instruction sheet given to you today for more information.    Follow-Up: At [facility], you and your health needs are our priority.  As part of our continuing mission to provide you with exceptional heart care, we have created designated Provider Care Teams.  These Care Teams include your primary Cardiologist (physician) and Advanced Practice Providers (APPs -  Physician Assistants and Nurse Practitioners) who all work together to provide you with the care you need, when you need it.  We recommend signing up for the patient portal called "MyChart".  Sign up information is provided on this After Visit Summary.  MyChart is used to connect  with patients for Virtual Visits (Telemedicine).  Patients are able to view lab/test results, encounter notes, upcoming appointments, etc.  Non-urgent messages can be sent to your provider as well.   To learn more about what you can do with MyChart, go to [email or web address].    Your next appointment:    Pending defibrillator placement   The format for your next appointment:   In Person  Provider:   Cristopher Peru, MD   Other Instructions Thank you for choosing [facility]!

## 2021-08-08 NOTE — Progress Notes (Signed)
HPI [name] is referred by Dr. Johnsie [name] for evaluation of chronic systolic heart failure to consider ICD insertion. He is a pleasant 60 yo man with a h/o LV dysfunction dating back 8 years ago. He represented after a long absence with worsening CHF symptoms. He has been found to have severe LV dysfunction. He has  been on guideline directed medical therapy and a repeat echo showed an EF of 25%. He has not had syncope. He denies peripheral edema and his CHF symptoms are now class 2.  No Known Allergies   Current Outpatient Medications  Medication Sig Dispense Refill   aspirin 81 MG chewable tablet Chew 81 mg by mouth in the morning.     carvedilol (COREG) 12.5 MG tablet Take 1 tablet (12.5 mg total) by mouth 2 (two) times daily. 60 tablet 6   dapagliflozin propanediol (FARXIGA) 10 MG TABS tablet Take 1 tablet (10 mg total) by mouth daily before breakfast. 30 tablet 11   furosemide (LASIX) 40 MG tablet Take 1 tablet (40 mg total) by mouth every other day. 15 tablet 3   isosorbide mononitrate (IMDUR) 60 MG 24 hr tablet Take 1 tablet (60 mg total) by mouth daily. 30 tablet 6   metFORMIN (GLUCOPHAGE) 500 MG tablet Take 500 mg by mouth 2 (two) times daily with a meal.      sacubitril-valsartan (ENTRESTO) 49-51 MG Take 1 tablet by mouth 2 (two) times daily. 60 tablet 6   spironolactone (ALDACTONE) 25 MG tablet Take 1 tablet (25 mg total) by mouth daily. 30 tablet 6   No current facility-administered medications for this visit.     Past Medical History:  Diagnosis Date   CKD (chronic kidney disease) stage 2, GFR 60-89 ml/min    Diabetes mellitus type II, non insulin dependent (HCC)    Dilated aortic root (HCC)    Hypercholesteremia    Hypertension    NICM (nonischemic cardiomyopathy) (HCC)    NSVT (nonsustained ventricular tachycardia)    Obstructive sleep apnea    was positive but never given a CPAP   Pulmonary hypertension (HCC)    PVC's (premature ventricular contractions)     Tobacco abuse     ROS:   All systems reviewed and negative except as noted in the HPI.   Past Surgical History:  Procedure Laterality Date   CARDIAC CATHETERIZATION  2002   COLONOSCOPY N/A 03/22/2014   Procedure: COLONOSCOPY;  Surgeon: James Binder, MD;  [facility];  Service: Endoscopy;  Laterality: N/A;  11:15 AM   INGUINAL HERNIA REPAIR Right 06/24/2017   Procedure: HERNIA REPAIR INGUINAL ADULT WITH MESH;  Surgeon: James Signs, MD;  Location: [facility];  Service: General;  Laterality: Right;   RIGHT/LEFT HEART CATH AND CORONARY ANGIOGRAPHY N/A 07/19/2021   Procedure: RIGHT/LEFT HEART CATH AND CORONARY ANGIOGRAPHY;  Surgeon: James Blanks, MD;  Location: [facility];  Service: Cardiovascular;  Laterality: N/A;     Family History  Problem Relation Age of Onset   Heart attack Father    Hypertension Father    Iron deficiency Father    Coronary artery disease Mother    Heart disease Maternal Grandfather        Also paternal grandfather   Cardiomyopathy Brother        No significant coronary disease by coronary angiography   Thyroid disease Sister    Sudden death Other    Colon cancer Neg Hx      Social  History   Socioeconomic History   Marital status: Married    Spouse name: James [name]   Number of children: 4   Years of education: Not on file   Highest education level: Associate degree: occupational, Hotel manager, or vocational program  Occupational History   Occupation: Librarian, academic    Comment: [employer]  Tobacco Use   Smoking status: Former    Packs/day: 0.50    Years: 32.00    Pack years: 16.00    Types: Cigarettes    Start date: 10/15/1982    Quit date: 07/18/2021    Years since quitting: 0.0   Smokeless tobacco: Never   Tobacco comments:    3 cigaretts a day  Vaping Use   Vaping Use: Never used  Substance and Sexual Activity   Alcohol use: No    Alcohol/week: 0.0 standard drinks   Drug use: No   Sexual activity: Yes     Birth control/protection: None  Other Topics Concern   Not on file  Social History Narrative   Divorced   No regular exercise   Social Determinants of Health   Financial Resource Strain: Low Risk    Difficulty of Paying Living Expenses: Not very hard  Food Insecurity: No Food Insecurity   Worried About Charity fundraiser in the Last Year: Never true   Ran Out of Food in the Last Year: Never true  Transportation Needs: No Transportation Needs   Lack of Transportation (Medical): No   Lack of Transportation (Non-Medical): No  Physical Activity: Not on file  Stress: Not on file  Social Connections: Not on file  Intimate Partner Violence: Not on file     BP 118/78   Pulse 64   Ht 6\' 6"  (1.981 m)   Wt 264 lb (119.7 kg)   SpO2 99%   BMI 30.51 kg/m   Physical Exam:  Well appearing NAD HEENT: Unremarkable Neck:  No JVD, no thyromegally Lymphatics:  No adenopathy Back:  No CVA tenderness Lungs:  Clear with no wheezes HEART:  Regular rate rhythm, no murmurs, no rubs, no clicks Abd:  soft, positive bowel sounds, no organomegally, no rebound, no guarding Ext:  2 plus pulses, no edema, no cyanosis, no clubbing Skin:  No rashes no nodules Neuro:  CN II through XII intact, motor grossly intact  EKG - reviewed. NSR with occaisional PVC's  Assess/Plan:  Non-ischemic CM - he has not had an improvement in his LV function. I have recommended continued guideline directed medical therapy and ICD insertion for primary prevention. He will call us if he wishes to undergo ICD insertion.  HTN - his bp is now well controlled. He will contiue his current meds and maintain a low sodium diet.  James Overlie Tavone Caesar,MD

## 2021-08-09 ENCOUNTER — Telehealth: Payer: Self-pay | Admitting: Internal Medicine

## 2021-08-09 DIAGNOSIS — Z0279 Encounter for issue of other medical certificate: Secondary | ICD-10-CM

## 2021-08-09 NOTE — Telephone Encounter (Signed)
Forms from [facility] received on 08/09/2021. Completed patient auth attached. Took form to MD Box for completion. FAXED TO [facility]  1:05PM 08/09/21 NJM

## 2021-08-11 ENCOUNTER — Ambulatory Visit: Payer: BC Managed Care – PPO | Admitting: Student

## 2021-08-11 NOTE — Telephone Encounter (Signed)
Form completed and faxed to [facility] on 08/11/21  njm

## 2021-08-11 NOTE — Progress Notes (Deleted)
Cardiology Office Note    Date:  08/11/2021   ID:  James [name], DOB May 16, 1961, MRN [id number]  PCP:  James Fire, MD  Cardiologist: James Rouge, MD    No chief complaint on file.   History of Present Illness:    James [name] is a 60 y.o. male with past medical history of HFrEF/NICM (EF 45% in 2013 with cath showing no CAD, EF 25% in 04/2021), HTN, HLD, Type 2 DM, dilated aortic root, OSA and tobacco use who presents to the office today for follow-up from his recent cardiac catheterization.  He was examined by Dr. Johnsie [name] in 06/2021 and recent stress test had shown evidence of prior infarct with no current ischemia but given his cardiomyopathy, a repeat cardiac catheterization was recommended for definitive evaluation. This was performed by Dr. Angelena [name] on 07/19/2021 and showed no angiographic evidence of CAD but his LVEDP was severely elevated and hemodynamic findings are consistent with severe pulmonary hypertension.  He was therefore admitted for IV diuresis and a repeat echocardiogram was obtained which showed his EF was at 25 to 30% and RV function was mildly reduced.  He did have moderate MR and dilation of the aortic root measuring 46 mm.  He responded well to IV Lasix and this was transitioned to 40 mg daily at the time of discharge.  Coreg was increased to 12.5 mg twice daily and Imdur was decreased to 60 mg daily.  He was also started on Entresto 24-26 mg twice daily and spironolactone 25 mg daily.  He did follow-up with the heart failure transition of care clinic on 07/28/2021 and his weight had increased by approximately 8 pounds since hospital discharge and he is report associated orthopnea.  Entresto was titrated to 49-51 mg twice daily given his elevated BP and Lasix was reduced to 40 mg every other day.  Was also started on Farxiga 10 mg daily. cMRI was also ordered for further evaluation of his cardiomyopathy.  Also followed up with James [name] on 08/08/2021 and is  planning to undergo ICD insertion on 08/21/2021.    Past Medical History:  Diagnosis Date   CKD (chronic kidney disease) stage 2, GFR 60-89 ml/min    Diabetes mellitus type II, non insulin dependent (HCC)    Dilated aortic root (HCC)    Hypercholesteremia    Hypertension    NICM (nonischemic cardiomyopathy) (HCC)    NSVT (nonsustained ventricular tachycardia)    Obstructive sleep apnea    was positive but never given a CPAP   Pulmonary hypertension ([place])    PVC's (premature ventricular contractions)    Tobacco abuse     Past Surgical History:  Procedure Laterality Date   CARDIAC CATHETERIZATION  2002   COLONOSCOPY N/A 03/22/2014   Procedure: COLONOSCOPY;  Surgeon: James Binder, MD;  [facility];  Service: Endoscopy;  Laterality: N/A;  11:15 AM   INGUINAL HERNIA REPAIR Right 06/24/2017   Procedure: HERNIA REPAIR INGUINAL ADULT WITH MESH;  Surgeon: James Signs, MD;  Location[facility];  Service: General;  Laterality: Right;   RIGHT/LEFT HEART CATH AND CORONARY ANGIOGRAPHY N/A 07/19/2021   Procedure: RIGHT/LEFT HEART CATH AND CORONARY ANGIOGRAPHY;  Surgeon: James Blanks, MD;  Location: [facility];  Service: Cardiovascular;  Laterality: N/A;    Current Medications: Outpatient Medications Prior to Visit  Medication Sig Dispense Refill   aspirin 81 MG chewable tablet Chew 81 mg by mouth in the morning.     carvedilol (COREG)  12.5 MG tablet Take 1 tablet (12.5 mg total) by mouth 2 (two) times daily. 60 tablet 6   dapagliflozin propanediol (FARXIGA) 10 MG TABS tablet Take 1 tablet (10 mg total) by mouth daily before breakfast. 30 tablet 11   furosemide (LASIX) 40 MG tablet Take 1 tablet (40 mg total) by mouth every other day. 15 tablet 3   isosorbide mononitrate (IMDUR) 60 MG 24 hr tablet Take 1 tablet (60 mg total) by mouth daily. 30 tablet 6   metFORMIN (GLUCOPHAGE) 500 MG tablet Take 500 mg by mouth 2 (two) times daily with a meal.       sacubitril-valsartan (ENTRESTO) 49-51 MG Take 1 tablet by mouth 2 (two) times daily. 60 tablet 6   spironolactone (ALDACTONE) 25 MG tablet Take 1 tablet (25 mg total) by mouth daily. 30 tablet 6   No facility-administered medications prior to visit.     Allergies:   Patient has no known allergies.   Social History   Socioeconomic History   Marital status: Married    Spouse name: James [name]   Number of children: 4   Years of education: Not on file   Highest education level: Associate degree: occupational, Hotel manager, or vocational program  Occupational History   Occupation: Librarian, academic    Comment: [employer]  Tobacco Use   Smoking status: Former    Packs/day: 0.50    Years: 32.00    Pack years: 16.00    Types: Cigarettes    Start date: 10/15/1982    Quit date: 07/18/2021    Years since quitting: 0.0   Smokeless tobacco: Never   Tobacco comments:    3 cigaretts a day  Vaping Use   Vaping Use: Never used  Substance and Sexual Activity   Alcohol use: No    Alcohol/week: 0.0 standard drinks   Drug use: No   Sexual activity: Yes    Birth control/protection: None  Other Topics Concern   Not on file  Social History Narrative   Divorced   No regular exercise   Social Determinants of Health   Financial Resource Strain: Low Risk    Difficulty of Paying Living Expenses: Not very hard  Food Insecurity: No Food Insecurity   Worried About Charity fundraiser in the Last Year: Never true   Ran Out of Food in the Last Year: Never true  Transportation Needs: No Transportation Needs   Lack of Transportation (Medical): No   Lack of Transportation (Non-Medical): No  Physical Activity: Not on file  Stress: Not on file  Social Connections: Not on file     Family History:  The patient's ***family history includes Cardiomyopathy in his brother; Coronary artery disease in his mother; Heart attack in his father; Heart disease in his maternal grandfather; Hypertension in his  father; Iron deficiency in his father; Sudden death in an other family member; Thyroid disease in his sister.   Review of Systems:    Please see the history of present illness.     All other systems reviewed and are otherwise negative except as noted above.   Physical Exam:    VS:  There were no vitals taken for this visit.   General: Well developed, well nourished,male appearing in no acute distress. Head: Normocephalic, atraumatic. Neck: No carotid bruits. JVD not elevated.  Lungs: Respirations regular and unlabored, without wheezes or rales.  Heart: ***Regular rate and rhythm. No S3 or S4.  No murmur, no rubs, or gallops appreciated. Abdomen: Appears non-distended.  No obvious abdominal masses. Msk:  Strength and tone appear normal for age. No obvious joint deformities or effusions. Extremities: No clubbing or cyanosis. No edema.  Distal pedal pulses are 2+ bilaterally. Neuro: Alert and oriented X 3. Moves all extremities spontaneously. No focal deficits noted. Psych:  Responds to questions appropriately with a normal affect. Skin: No rashes or lesions noted  Wt Readings from Last 3 Encounters:  08/08/21 264 lb (119.7 kg)  07/28/21 258 lb (117 kg)  07/21/21 256 lb 6.4 oz (116.3 kg)        Studies/Labs Reviewed:   EKG:  EKG is*** ordered today.  The ekg ordered today demonstrates ***  Recent Labs: 12/10/2020: ALT 28; B Natriuretic Peptide 413.0 07/21/2021: Magnesium 2.3; TSH 1.340 08/08/2021: BUN 22; Creatinine, Ser 1.34; Hemoglobin 17.4; Platelets 178; Potassium 4.2; Sodium 136   Lipid Panel    Component Value Date/Time   CHOL 190 10/31/2012 1553   TRIG 143 10/31/2012 1553   HDL 38 (L) 10/31/2012 1553   CHOLHDL 5.0 10/31/2012 1553   VLDL 29 10/31/2012 1553   LDLCALC 123 (H) 10/31/2012 1553    Additional studies/ records that were reviewed today include:   Echocardiogram: 07/2021 IMPRESSIONS     1. Left ventricular ejection fraction, by estimation, is 25 to  30%. The  left ventricle has severely decreased function. The left ventricle  demonstrates global hypokinesis. The left ventricular internal cavity size  was moderately dilated. Left  ventricular diastolic parameters are consistent with Grade III diastolic  dysfunction (restrictive).   2. Right ventricular systolic function is mildly reduced. The right  ventricular size is mildly enlarged.   3. Left atrial size was moderately dilated.   4. Right atrial size was moderately dilated.   5. The mitral valve is normal in structure. Moderate mitral valve  regurgitation. No evidence of mitral stenosis.   6. The aortic valve is normal in structure. Aortic valve regurgitation is  not visualized. No aortic stenosis is present.   7. Aortic dilatation noted. There is moderate dilatation of the aortic  root, measuring 46 mm.   8. The inferior vena cava is normal in size with greater than 50%  respiratory variability, suggesting right atrial pressure of 3 mmHg.   Comparison(s): No significant change from prior study. Prior images  reviewed side by side.   Cardiac Catheterization: 67/8938 LV end diastolic pressure is severely elevated.   Hemodynamic findings consistent with severe pulmonary hypertension.   No angiographic evidence of CAD Elevated right and left heart pressures (LV: 130/22/39, RA 15, RV 82/13/20, PA 83/30 mean 49, PCWP 35).    Recommendations: Will admit to telemetry for diuresis with IV Lasix given ongoing dyspnea, volume overload and severely elevated filling pressures.    Assessment:    No diagnosis found.   Plan:   In order of problems listed above:  ***    Shared Decision Making/Informed Consent:   {Are you ordering a CV Procedure (e.g. stress test, cath, DCCV, TEE, etc)?   Press F2        :[id number]}    Medication Adjustments/Labs and Tests Ordered: Current medicines are reviewed at length with the patient today.  Concerns regarding medicines are outlined  above.  Medication changes, Labs and Tests ordered today are listed in the Patient Instructions below. There are no Patient Instructions on file for this visit.   Signed, Erma Heritage, PA-C  08/11/2021 7:38 AM    [facility] [address],  [place] [address] Phone: [phone] Fax: [phone]

## 2021-08-13 NOTE — Progress Notes (Incomplete)
No show

## 2021-08-14 ENCOUNTER — Encounter (HOSPITAL_COMMUNITY): Payer: BC Managed Care – PPO

## 2021-08-18 NOTE — Pre-Procedure Instructions (Signed)
Attempted to call patient regarding procedure instructions.  No answer, voicemail not set up.

## 2021-08-21 ENCOUNTER — Other Ambulatory Visit: Payer: Self-pay

## 2021-08-21 ENCOUNTER — Ambulatory Visit (HOSPITAL_COMMUNITY)
Admission: RE | Admit: 2021-08-21 | Discharge: 2021-08-21 | Disposition: A | Discharge status: Home / Self Care | Payer: BC Managed Care – PPO | Attending: Internal Medicine | Admitting: Internal Medicine

## 2021-08-21 ENCOUNTER — Ambulatory Visit (HOSPITAL_COMMUNITY): Payer: BC Managed Care – PPO

## 2021-08-21 ENCOUNTER — Ambulatory Visit (HOSPITAL_COMMUNITY)
Admission: RE | Disposition: A | Discharge status: Home / Self Care | Payer: Self-pay | Source: Home / Self Care | Attending: Internal Medicine

## 2021-08-21 DIAGNOSIS — Z95810 Presence of automatic (implantable) cardiac defibrillator: Secondary | ICD-10-CM

## 2021-08-21 DIAGNOSIS — E7800 Pure hypercholesterolemia, unspecified: Secondary | ICD-10-CM | POA: Insufficient documentation

## 2021-08-21 DIAGNOSIS — I428 Other cardiomyopathies: Secondary | ICD-10-CM | POA: Diagnosis not present

## 2021-08-21 DIAGNOSIS — Z79899 Other long term (current) drug therapy: Secondary | ICD-10-CM | POA: Insufficient documentation

## 2021-08-21 DIAGNOSIS — E1122 Type 2 diabetes mellitus with diabetic chronic kidney disease: Secondary | ICD-10-CM | POA: Insufficient documentation

## 2021-08-21 DIAGNOSIS — N182 Chronic kidney disease, stage 2 (mild): Secondary | ICD-10-CM | POA: Diagnosis not present

## 2021-08-21 DIAGNOSIS — G4733 Obstructive sleep apnea (adult) (pediatric): Secondary | ICD-10-CM | POA: Diagnosis not present

## 2021-08-21 DIAGNOSIS — Z8249 Family history of ischemic heart disease and other diseases of the circulatory system: Secondary | ICD-10-CM | POA: Diagnosis not present

## 2021-08-21 DIAGNOSIS — Z7982 Long term (current) use of aspirin: Secondary | ICD-10-CM | POA: Insufficient documentation

## 2021-08-21 DIAGNOSIS — I517 Cardiomegaly: Secondary | ICD-10-CM | POA: Diagnosis not present

## 2021-08-21 DIAGNOSIS — I272 Pulmonary hypertension, unspecified: Secondary | ICD-10-CM | POA: Insufficient documentation

## 2021-08-21 DIAGNOSIS — I5022 Chronic systolic (congestive) heart failure: Secondary | ICD-10-CM | POA: Diagnosis not present

## 2021-08-21 DIAGNOSIS — Z7984 Long term (current) use of oral hypoglycemic drugs: Secondary | ICD-10-CM | POA: Diagnosis not present

## 2021-08-21 DIAGNOSIS — I130 Hypertensive heart and chronic kidney disease with heart failure and stage 1 through stage 4 chronic kidney disease, or unspecified chronic kidney disease: Secondary | ICD-10-CM | POA: Diagnosis not present

## 2021-08-21 DIAGNOSIS — Z87891 Personal history of nicotine dependence: Secondary | ICD-10-CM | POA: Insufficient documentation

## 2021-08-21 HISTORY — PX: ICD IMPLANT: EP1208

## 2021-08-21 LAB — GLUCOSE, CAPILLARY
Glucose-Capillary: 122 mg/dL — ABNORMAL HIGH (ref 70–99)
Glucose-Capillary: 93 mg/dL (ref 70–99)

## 2021-08-21 SURGERY — ICD IMPLANT

## 2021-08-21 MED ORDER — LIDOCAINE HCL (PF) 1 % IJ SOLN
INTRAMUSCULAR | Status: DC | PRN
  Administered 2021-08-21: 50 mL

## 2021-08-21 MED ORDER — ACETAMINOPHEN 325 MG PO TABS
325.0000 mg | ORAL_TABLET | ORAL | Status: DC | PRN
  Administered 2021-08-21: 650 mg via ORAL
  Filled 2021-08-21 (×3): qty 2

## 2021-08-21 MED ORDER — HEPARIN (PORCINE) IN NACL 1000-0.9 UT/500ML-% IV SOLN
INTRAVENOUS | Status: DC | PRN
  Administered 2021-08-21: 500 mL

## 2021-08-21 MED ORDER — MIDAZOLAM HCL 5 MG/5ML IJ SOLN
INTRAMUSCULAR | Status: DC | PRN
  Administered 2021-08-21: 2 mg via INTRAVENOUS
  Administered 2021-08-21 (×2): 1 mg via INTRAVENOUS

## 2021-08-21 MED ORDER — LIDOCAINE HCL 1 % IJ SOLN
INTRAMUSCULAR | Status: AC
  Filled 2021-08-21: qty 60

## 2021-08-21 MED ORDER — HEPARIN (PORCINE) IN NACL 1000-0.9 UT/500ML-% IV SOLN
INTRAVENOUS | Status: AC
  Filled 2021-08-21: qty 500

## 2021-08-21 MED ORDER — FENTANYL CITRATE (PF) 100 MCG/2ML IJ SOLN
INTRAMUSCULAR | Status: AC
  Filled 2021-08-21: qty 2

## 2021-08-21 MED ORDER — ONDANSETRON HCL 4 MG/2ML IJ SOLN: 4.0000 mg | Freq: Four times a day (QID) | INTRAMUSCULAR | Status: DC | PRN

## 2021-08-21 MED ORDER — CEFAZOLIN SODIUM-DEXTROSE 2-4 GM/100ML-% IV SOLN
INTRAVENOUS | Status: AC
  Filled 2021-08-21: qty 100

## 2021-08-21 MED ORDER — CHLORHEXIDINE GLUCONATE 4 % EX LIQD
4.0000 "application " | Freq: Once | CUTANEOUS | Status: DC
  Filled 2021-08-21: qty 60

## 2021-08-21 MED ORDER — POVIDONE-IODINE 10 % EX SWAB
2.0000 "application " | Freq: Once | CUTANEOUS | Status: AC
  Administered 2021-08-21: 2 via TOPICAL

## 2021-08-21 MED ORDER — CEFAZOLIN SODIUM-DEXTROSE 2-4 GM/100ML-% IV SOLN
2.0000 g | Freq: Once | INTRAVENOUS | Status: AC
  Administered 2021-08-21: 2 g via INTRAVENOUS
  Filled 2021-08-21: qty 100

## 2021-08-21 MED ORDER — SODIUM CHLORIDE 0.9 % IV SOLN
INTRAVENOUS | Status: AC
  Filled 2021-08-21: qty 2

## 2021-08-21 MED ORDER — MIDAZOLAM HCL 5 MG/5ML IJ SOLN
INTRAMUSCULAR | Status: AC
  Filled 2021-08-21: qty 5

## 2021-08-21 MED ORDER — SODIUM CHLORIDE 0.9 % IV SOLN: INTRAVENOUS | Status: DC

## 2021-08-21 MED ORDER — FENTANYL CITRATE (PF) 100 MCG/2ML IJ SOLN
INTRAMUSCULAR | Status: DC | PRN
  Administered 2021-08-21: 25 ug via INTRAVENOUS
  Administered 2021-08-21: 12.5 ug via INTRAVENOUS

## 2021-08-21 MED ORDER — CEFAZOLIN SODIUM-DEXTROSE 2-4 GM/100ML-% IV SOLN
2.0000 g | INTRAVENOUS | Status: AC
  Administered 2021-08-21: 2 g via INTRAVENOUS
  Filled 2021-08-21: qty 100

## 2021-08-21 MED ORDER — SODIUM CHLORIDE 0.9 % IV SOLN
80.0000 mg | INTRAVENOUS | Status: AC
  Administered 2021-08-21: 80 mg
  Filled 2021-08-21: qty 2

## 2021-08-21 SURGICAL SUPPLY — 7 items
CABLE SURGICAL S-101-97-12 (CABLE) ×2 IMPLANT
ICD ACTICOR DX (ICD Generator) ×2 IMPLANT
LEAD PLEXA 65/15 (Lead) ×2 IMPLANT
MAT PREVALON FULL STRYKER (MISCELLANEOUS) ×2 IMPLANT
PAD PRO RADIOLUCENT 2001M-C (PAD) ×2 IMPLANT
SHEATH 8FR PRELUDE SNAP 13 (SHEATH) ×2 IMPLANT
TRAY PACEMAKER INSERTION (PACKS) ×2 IMPLANT

## 2021-08-21 NOTE — Discharge Instructions (Addendum)
After Your ICD (Implantable Cardiac Defibrillator)   You have a Biotronik ICD  ACTIVITY Do not lift your arm above shoulder height for 1 week after your procedure. After 7 days, you may progress as below.  You should remove your sling 24 hours after your procedure, unless otherwise instructed by your provider.     Monday August 28, 2021  Tuesday August 29, 2021 Wednesday August 30, 2021 Thursday August 31, 2021   Do not lift, push, pull, or carry anything over 10 pounds with the affected arm until 6 weeks (Monday October 02, 2021 ) after your procedure.   You may drive AFTER your wound check, unless you have been told otherwise by your provider.   Ask your healthcare provider when you can go back to work   INCISION/Dressing If you are on a blood thinner such as Coumadin, Xarelto, Eliquis, Plavix, or Pradaxa please confirm with your provider when this should be resumed.   If large square, outer bandage is left in place, this can be removed after 24 hours from your procedure. Do not remove steri-strips or glue as below.   Monitor your defibrillator site for redness, swelling, and drainage. Call the device clinic at [phone] if you experience these symptoms or fever/chills.  If your incision is sealed with Steri-strips or staples, you may shower 10 days after your procedure or when told by your provider. Do not remove the steri-strips or let the shower hit directly on your site. You may wash around your site with soap and water.    If you were discharged in a sling, please do not wear this during the day more than 48 hours after your surgery unless otherwise instructed. This may increase the risk of stiffness and soreness in your shoulder.   Avoid lotions, ointments, or perfumes over your incision until it is well-healed.  You may use a hot tub or a pool AFTER your wound check appointment if the incision is completely closed.  Your ICD is designed to protect you from life  threatening heart rhythms. Because of this, you may receive a shock.   1 shock with no symptoms:  Call the office during business hours. 1 shock with symptoms (chest pain, chest pressure, dizziness, lightheadedness, shortness of breath, overall feeling unwell):  Call 911. If you experience 2 or more shocks in 24 hours:  Call 911. If you receive a shock, you should not drive for 6 months per the [place] DMV IF you receive appropriate therapy from your ICD.   ICD Alerts:  Some alerts are vibratory and others beep. These are NOT emergencies. Please call our office to let us know. If this occurs at night or on weekends, it can wait until the next business day. Send a remote transmission.  If your device is capable of reading fluid status (for heart failure), you will be offered monthly monitoring to review this with you.   DEVICE MANAGEMENT Remote monitoring is used to monitor your ICD from home. This monitoring is scheduled every 91 days by our office. It allows [name] to keep an eye on the functioning of your device to ensure it is working properly. You will routinely see your Electrophysiologist annually (more often if necessary).   You should receive your ID card for your new device in 4-8 weeks. Keep this card with you at all times once received. Consider wearing a medical alert bracelet or necklace.  Your ICD  may be MRI compatible. This will be discussed at your next  office visit/wound check.  You should avoid contact with strong electric or magnetic fields.   Do not use amateur (ham) radio equipment or electric (arc) welding torches. MP3 player headphones with magnets should not be used. Some devices are safe to use if held at least 12 inches (30 cm) from your defibrillator. These include power tools, lawn mowers, and speakers. If you are unsure if something is safe to use, ask your health care provider.  When using your cell phone, hold it to the ear that is on the opposite side from the  defibrillator. Do not leave your cell phone in a pocket over the defibrillator.  You may safely use electric blankets, heating pads, computers, and microwave ovens.  Call the office right away if: You have chest pain. You feel more than one shock. You feel more short of breath than you have felt before. You feel more light-headed than you have felt before. Your incision starts to open up.  This information is not intended to replace advice given to you by your health care provider. Make sure you discuss any questions you have with your health care provider.

## 2021-08-21 NOTE — Progress Notes (Signed)
Pt ambulated without difficulty or bleeding.   Discharged home with his wife who will drive and stay with pt x 24 hrs. 

## 2021-08-21 NOTE — Progress Notes (Signed)
Pt has cardiac MRI scheduled for wed 08/23/21 however patient received new Biotronik ICD today 08/21/21. Per GT recommendations, device and lead are MR compatible but suggests waiting 6 wks before having CMR.   Will contact patient to r/s appt after 6 wk period.   Marchia Bond RN Navigator Cardiac Imaging [facility] [phone] Office  [phone] Cell

## 2021-08-21 NOTE — Interval H&P Note (Signed)
History and Physical Interval Note:  08/21/2021 7:21 AM  James [name]  has presented today for surgery, with the diagnosis of heart failure.  The various methods of treatment have been discussed with the patient and family. After consideration of risks, benefits and other options for treatment, the patient has consented to  Procedure(s): ICD IMPLANT (N/A) as a surgical intervention.  The patient's history has been reviewed, patient examined, no change in status, stable for surgery.  I have reviewed the patient's chart and labs.  Questions were answered to the patient's satisfaction.     James [name]

## 2021-08-21 NOTE — Progress Notes (Signed)
Discharge instructions reviewed with pt and his wife. Both voice understanding. 

## 2021-08-21 NOTE — Progress Notes (Addendum)
Pt had 19 beats of Vtach. Watt Cl[place], [place] paged.pt resting with eyes closed, showing no distress.

## 2021-08-21 NOTE — Progress Notes (Addendum)
No call back from PA. Dr Lovena Le here informed of [place]. No new orders

## 2021-08-22 ENCOUNTER — Telehealth: Payer: Self-pay

## 2021-08-22 ENCOUNTER — Encounter (HOSPITAL_COMMUNITY): Payer: Self-pay | Admitting: Internal Medicine

## 2021-08-22 MED FILL — Lidocaine HCl Local Inj 1%: INTRAMUSCULAR | Qty: 50 | Status: AC

## 2021-08-22 NOTE — Telephone Encounter (Signed)
Follow-up after same day discharge: Implant date: 08/21/21 MD: Cristopher Peru, MD Device: Biotronik ICD Location: Left chest   Wound check visit: 08/31/21 90 day MD follow-up: 11/29/21  Remote Transmission received:yes  Dressing removed: not yet.  Educated patient to remove today and s/s of infection to monitor for and report.   Reviewed activity restrictions per DC instructions.    Pt reports some mild pain today, has not required tylenol since last night.

## 2021-08-22 NOTE — Telephone Encounter (Signed)
-----   Message from [place], [place] sent at 08/21/2021 11:04 AM EST ----- Same day d/c Biotronik ICD GT

## 2021-08-23 ENCOUNTER — Ambulatory Visit (HOSPITAL_COMMUNITY): Admission: RE | Admit: 2021-08-23 | Payer: BC Managed Care – PPO | Source: Ambulatory Visit

## 2021-08-24 ENCOUNTER — Other Ambulatory Visit (HOSPITAL_COMMUNITY): Payer: Self-pay

## 2021-08-31 ENCOUNTER — Ambulatory Visit: Payer: BC Managed Care – PPO

## 2021-08-31 ENCOUNTER — Other Ambulatory Visit: Payer: Self-pay

## 2021-08-31 ENCOUNTER — Ambulatory Visit (INDEPENDENT_AMBULATORY_CARE_PROVIDER_SITE_OTHER): Payer: BC Managed Care – PPO

## 2021-08-31 DIAGNOSIS — I428 Other cardiomyopathies: Secondary | ICD-10-CM

## 2021-08-31 LAB — CUP PACEART INCLINIC DEVICE CHECK
Battery Voltage: 3.12 V
Brady Statistic RV Percent Paced: 0 %
Date Time Interrogation Session: 20221117110954
HighPow Impedance: 60 Ohm
Implantable Lead Implant Date: 20221107
Implantable Lead Location: 753860
Implantable Lead Model: 436909
Implantable Lead Serial Number: 81476621
Implantable Pulse Generator Implant Date: 20221107
Lead Channel Impedance Value: 521 Ohm
Lead Channel Pacing Threshold Amplitude: 0.5 V
Lead Channel Pacing Threshold Pulse Width: 0.4 ms
Lead Channel Sensing Intrinsic Amplitude: 3 mV
Lead Channel Sensing Intrinsic Amplitude: 9.6 mV
Lead Channel Setting Pacing Amplitude: 3 V
Lead Channel Setting Pacing Pulse Width: 0.4 ms
Lead Channel Setting Sensing Sensitivity: 0.8 mV
Pulse Gen Model: 429525
Pulse Gen Serial Number: 84864435

## 2021-08-31 NOTE — Progress Notes (Signed)
Wound check appointment. Steri-strips removed. Wound without redness or edema. Incision edges approximated, wound well healed. Normal device function. Thresholds, sensing, and impedances consistent with implant measurements. Device programmed at 3.0V for extra safety margin until 3 month visit. Histogram distribution appropriate for patient and level of activity. 1 ventricular arrhythmias noted- Appears NSVT lasting 19 beats average V rate of 203bpm, occurring at 4am, pt denies awareness of event. Patient educated about wound care, arm mobility, lifting restrictions, shock plan. Patient is enrolled in remote monitoring, transmitting nightly, next scheduled summary report 11/20/21.  ROV with Dr. Lovena Le on 11/29/21.

## 2021-08-31 NOTE — Patient Instructions (Signed)
   After Your ICD (Implantable Cardiac Defibrillator)    Monitor your defibrillator site for redness, swelling, and drainage. Call the device clinic at [phone] if you experience these symptoms or fever/chills.  Your incision was closed with Steri-strips or staples:  You may shower 7 days after your procedure and wash your incision with soap and water. Avoid lotions, ointments, or perfumes over your incision until it is well-healed.    You may use a hot tub or a pool after your wound check appointment if the incision is completely closed.  Do not lift, push or pull greater than 10 pounds with the affected arm until 6 weeks after your procedure. There are no other restrictions in arm movement after your wound check appointment.  Your ICD is MRI compatible.  Your ICD is designed to protect you from life threatening heart rhythms. Because of this, you may receive a shock.   1 shock with no symptoms:  Call the office during business hours. 1 shock with symptoms (chest pain, chest pressure, dizziness, lightheadedness, shortness of breath, overall feeling unwell):  Call 911. If you experience 2 or more shocks in 24 hours:  Call 911. If you receive a shock, you should not drive.  [place] DMV - no driving for 6 months if you receive appropriate therapy from your ICD.   ICD Alerts:  Some alerts are vibratory and others beep. These are NOT emergencies. Please call our office to let us know. If this occurs at night or on weekends, it can wait until the next business day. Send a remote transmission.  If your device is capable of reading fluid status (for heart failure), you will be offered monthly monitoring to review this with you.   Remote monitoring is used to monitor your ICD from home. This monitoring is scheduled every 91 days by our office. It allows us to keep an eye on the functioning of your device to ensure it is working properly. You will routinely see your Electrophysiologist annually  (more often if necessary).   

## 2021-09-04 DIAGNOSIS — I5022 Chronic systolic (congestive) heart failure: Secondary | ICD-10-CM | POA: Diagnosis not present

## 2021-09-04 DIAGNOSIS — J441 Chronic obstructive pulmonary disease with (acute) exacerbation: Secondary | ICD-10-CM | POA: Diagnosis not present

## 2021-09-04 DIAGNOSIS — Z23 Encounter for immunization: Secondary | ICD-10-CM | POA: Diagnosis not present

## 2021-09-04 DIAGNOSIS — E119 Type 2 diabetes mellitus without complications: Secondary | ICD-10-CM | POA: Diagnosis not present

## 2021-09-04 DIAGNOSIS — I10 Essential (primary) hypertension: Secondary | ICD-10-CM | POA: Diagnosis not present

## 2021-09-04 DIAGNOSIS — F17210 Nicotine dependence, cigarettes, uncomplicated: Secondary | ICD-10-CM | POA: Diagnosis not present

## 2021-10-02 ENCOUNTER — Other Ambulatory Visit (HOSPITAL_COMMUNITY): Payer: BC Managed Care – PPO

## 2021-10-03 ENCOUNTER — Telehealth: Payer: Self-pay | Admitting: Internal Medicine

## 2021-10-03 NOTE — Telephone Encounter (Signed)
Outreach made to Pt.  Pt had ICD implant and was cleared to work with limitations on August 29, 2021.    Per Pt he was not aware he needed to return to work because he did not receive a copy of the [employer] paperwork.  Spoke with Dr. Lovena Le.  Pt is cleared to return to work with no restrictions.  Will update his FMLA to reflect Pt may return to work on October 04, 2021 with no restrictions (related to ICD implant).  Pt aware.  Pt would like a copy of updated paperwork.   Updated FMLA sent to [place] office and [place].  [place] made aware and will provide copy of paperwork for Pt to pick up.  Pt is being evaluated by Advanced Heart Failure clinic.  Further FMLA needs should be addressed by [facility].

## 2021-10-03 NOTE — Telephone Encounter (Signed)
Patient came into [place] office stating that he was not aware that he had a return to work date set on his FMLA paper work, as he never saw the completed forms.    Pt stated that he is not able to return to work at this time and stated that he would not be allowed to return to work with restrictions even if he was. Patient was under the impression that he would not return to work until he saw Dr. Lovena Le in Feb 2023.  Pt would like to know if Dr. Lovena Le would need to see him in office to check him out and give him a letter with a new return to work date, so he can extend his leave?  Please call [phone]

## 2021-10-12 ENCOUNTER — Other Ambulatory Visit: Payer: Self-pay | Admitting: Internal Medicine

## 2021-10-12 ENCOUNTER — Other Ambulatory Visit (HOSPITAL_COMMUNITY): Payer: Self-pay | Admitting: Internal Medicine

## 2021-10-12 ENCOUNTER — Telehealth (HOSPITAL_COMMUNITY): Payer: Self-pay | Admitting: Emergency Medicine

## 2021-10-12 DIAGNOSIS — I77810 Thoracic aortic ectasia: Secondary | ICD-10-CM

## 2021-10-12 NOTE — Telephone Encounter (Signed)
Attempted to call patient regarding upcoming cardiac MR appointment. Left message on voicemail with name and callback number Marchia Bond RN Navigator Cardiac Imaging [facility] [phone] Office [phone] Cell  Patients phone vm box not set up to receive messages yet Left VM on spouses phone for call back

## 2021-10-13 ENCOUNTER — Ambulatory Visit (HOSPITAL_COMMUNITY): Admission: RE | Admit: 2021-10-13 | Payer: BC Managed Care – PPO | Source: Ambulatory Visit

## 2021-10-30 DIAGNOSIS — B351 Tinea unguium: Secondary | ICD-10-CM | POA: Diagnosis not present

## 2021-10-30 DIAGNOSIS — E1140 Type 2 diabetes mellitus with diabetic neuropathy, unspecified: Secondary | ICD-10-CM | POA: Diagnosis not present

## 2021-10-30 DIAGNOSIS — I739 Peripheral vascular disease, unspecified: Secondary | ICD-10-CM | POA: Diagnosis not present

## 2021-10-30 DIAGNOSIS — B353 Tinea pedis: Secondary | ICD-10-CM | POA: Diagnosis not present

## 2021-11-01 ENCOUNTER — Other Ambulatory Visit (HOSPITAL_COMMUNITY): Payer: Self-pay | Admitting: Podiatry

## 2021-11-01 DIAGNOSIS — I739 Peripheral vascular disease, unspecified: Secondary | ICD-10-CM

## 2021-11-07 ENCOUNTER — Other Ambulatory Visit: Payer: Self-pay

## 2021-11-07 ENCOUNTER — Ambulatory Visit (HOSPITAL_COMMUNITY)
Admission: RE | Admit: 2021-11-07 | Discharge: 2021-11-07 | Disposition: A | Discharge status: Home / Self Care | Payer: BC Managed Care – PPO | Source: Ambulatory Visit | Attending: Podiatry | Admitting: Podiatry

## 2021-11-07 DIAGNOSIS — I739 Peripheral vascular disease, unspecified: Secondary | ICD-10-CM | POA: Diagnosis not present

## 2021-11-07 DIAGNOSIS — I7092 Chronic total occlusion of artery of the extremities: Secondary | ICD-10-CM | POA: Diagnosis not present

## 2021-11-19 LAB — CUP PACEART REMOTE DEVICE CHECK
Battery Voltage: 3.11 V
Brady Statistic RV Percent Paced: 0 %
Date Time Interrogation Session: 20230205065104
HighPow Impedance: 71 Ohm
Implantable Lead Implant Date: 20221107
Implantable Lead Location: 753860
Implantable Lead Model: 436909
Implantable Lead Serial Number: 81476621
Implantable Pulse Generator Implant Date: 20221107
Lead Channel Impedance Value: 599 Ohm
Lead Channel Pacing Threshold Amplitude: 0.6 V
Lead Channel Pacing Threshold Pulse Width: 0.4 ms
Lead Channel Sensing Intrinsic Amplitude: 115 mV
Lead Channel Sensing Intrinsic Amplitude: 7.7 mV
Lead Channel Setting Pacing Amplitude: 3 V
Lead Channel Setting Pacing Pulse Width: 0.4 ms
Lead Channel Setting Sensing Sensitivity: 0.8 mV
Pulse Gen Model: 429525
Pulse Gen Serial Number: 84864435

## 2021-11-20 ENCOUNTER — Ambulatory Visit (INDEPENDENT_AMBULATORY_CARE_PROVIDER_SITE_OTHER): Payer: BC Managed Care – PPO

## 2021-11-20 DIAGNOSIS — I428 Other cardiomyopathies: Secondary | ICD-10-CM

## 2021-11-22 NOTE — Progress Notes (Signed)
Remote ICD transmission.   

## 2021-11-29 ENCOUNTER — Other Ambulatory Visit: Payer: Self-pay

## 2021-11-29 ENCOUNTER — Encounter: Payer: Self-pay | Admitting: Internal Medicine

## 2021-11-29 ENCOUNTER — Ambulatory Visit (INDEPENDENT_AMBULATORY_CARE_PROVIDER_SITE_OTHER): Payer: BC Managed Care – PPO | Admitting: Internal Medicine

## 2021-11-29 VITALS — BP 130/74 | HR 80 | Ht 78.0 in | Wt 263.4 lb

## 2021-11-29 DIAGNOSIS — I4729 Other ventricular tachycardia: Secondary | ICD-10-CM

## 2021-11-29 NOTE — Progress Notes (Signed)
HPI [name] returns today for followup. He is a pleasant 61 yo man with chronic systolic heart failure, non-ischemic CM and tobacco abuse. He is s/p ICD insertion for primary prevention. He denies chest pain or sob. He is smoking 1/2 PPD.  No Known Allergies   Current Outpatient Medications  Medication Sig Dispense Refill   acetaminophen (TYLENOL) 500 MG tablet Take 1,000 mg by mouth every 6 (six) hours as needed for moderate pain.     aspirin 81 MG chewable tablet Chew 81 mg by mouth in the morning.     carvedilol (COREG) 12.5 MG tablet Take 1 tablet (12.5 mg total) by mouth 2 (two) times daily. 60 tablet 6   dapagliflozin propanediol (FARXIGA) 10 MG TABS tablet Take 1 tablet (10 mg total) by mouth daily before breakfast. 30 tablet 11   furosemide (LASIX) 40 MG tablet Take 1 tablet (40 mg total) by mouth every other day. 15 tablet 3   isosorbide mononitrate (IMDUR) 60 MG 24 hr tablet Take 1 tablet (60 mg total) by mouth daily. 30 tablet 6   metFORMIN (GLUCOPHAGE) 500 MG tablet Take 500 mg by mouth 2 (two) times daily with a meal.      sacubitril-valsartan (ENTRESTO) 24-26 MG Take 1 tablet by mouth 2 (two) times daily.     spironolactone (ALDACTONE) 25 MG tablet Take 1 tablet (25 mg total) by mouth daily. 30 tablet 6   sacubitril-valsartan (ENTRESTO) 49-51 MG Take 1 tablet by mouth 2 (two) times daily. (Patient not taking: Reported on 11/29/2021) 60 tablet 6   No current facility-administered medications for this visit.     Past Medical History:  Diagnosis Date   CKD (chronic kidney disease) stage 2, GFR 60-89 ml/min    Diabetes mellitus type II, non insulin dependent (HCC)    Dilated aortic root (HCC)    Hypercholesteremia    Hypertension    NICM (nonischemic cardiomyopathy) (HCC)    NSVT (nonsustained ventricular tachycardia)    Obstructive sleep apnea    was positive but never given a CPAP   Pulmonary hypertension (HCC)    PVC's (premature ventricular contractions)     Tobacco abuse     ROS:   All systems reviewed and negative except as noted in the HPI.   Past Surgical History:  Procedure Laterality Date   CARDIAC CATHETERIZATION  2002   COLONOSCOPY N/A 03/22/2014   Procedure: COLONOSCOPY;  Surgeon: Danie Binder, MD;  Location: [facility];  Service: Endoscopy;  Laterality: N/A;  11:15 AM   ICD IMPLANT N/A 08/21/2021   Procedure: ICD IMPLANT;  Surgeon: Evans Lance, MD;  Location: [facility];  Service: Cardiovascular;  Laterality: N/A;   INGUINAL HERNIA REPAIR Right 06/24/2017   Procedure: HERNIA REPAIR INGUINAL ADULT WITH MESH;  Surgeon: Aviva Signs, MD;  Location: [facility];  Service: General;  Laterality: Right;   RIGHT/LEFT HEART CATH AND CORONARY ANGIOGRAPHY N/A 07/19/2021   Procedure: RIGHT/LEFT HEART CATH AND CORONARY ANGIOGRAPHY;  Surgeon: Burnell Blanks, MD;  Location: [place] [facility];  Service: Cardiovascular;  Laterality: N/A;     Family History  Problem Relation Age of Onset   Heart attack Father    Hypertension Father    Iron deficiency Father    Coronary artery disease Mother    Heart disease Maternal Grandfather        Also paternal grandfather   Cardiomyopathy Brother        No significant coronary  disease by coronary angiography   Thyroid disease Sister    Sudden death Other    Colon cancer Neg Hx      Social History   Socioeconomic History   Marital status: Married    Spouse name: James [name]   Number of children: 4   Years of education: Not on file   Highest education level: Associate degree: occupational, Hotel manager, or vocational program  Occupational History   Occupation: Librarian, academic    Comment: [employer]  Tobacco Use   Smoking status: Former    Packs/day: 0.50    Years: 32.00    Pack years: 16.00    Types: Cigarettes    Start date: 10/15/1982    Quit date: 07/18/2021    Years since quitting: 0.3   Smokeless tobacco: Never   Tobacco comments:    3 cigaretts a day  Vaping Use    Vaping Use: Never used  Substance and Sexual Activity   Alcohol use: No    Alcohol/week: 0.0 standard drinks   Drug use: No   Sexual activity: Yes    Birth control/protection: None  Other Topics Concern   Not on file  Social History Narrative   Divorced   No regular exercise   Social Determinants of Health   Financial Resource Strain: Low Risk    Difficulty of Paying Living Expenses: Not very hard  Food Insecurity: No Food Insecurity   Worried About Charity fundraiser in the Last Year: Never true   Ran Out of Food in the Last Year: Never true  Transportation Needs: No Transportation Needs   Lack of Transportation (Medical): No   Lack of Transportation (Non-Medical): No  Physical Activity: Not on file  Stress: Not on file  Social Connections: Not on file  Intimate Partner Violence: Not on file     BP 130/74    Pulse 80    Ht 6\' 6"  (1.981 m)    Wt 263 lb 6.4 oz (119.5 kg)    SpO2 98%    BMI 30.44 kg/m   Physical Exam:  Well appearing NAD HEENT: Unremarkable Neck:  No JVD, no thyromegally Lymphatics:  No adenopathy Back:  No CVA tenderness Lungs:  Clear with no wheezes HEART:  Regular rate rhythm, no murmurs, no rubs, no clicks Abd:  soft, positive bowel sounds, no organomegally, no rebound, no guarding Ext:  2 plus pulses, no edema, no cyanosis, no clubbing Skin:  No rashes no nodules Neuro:  CN II through XII intact, motor grossly intact   DEVICE  Normal device function.  See PaceArt for details.   Assess/Plan:  Chronic systolic heart failure - his symptoms are well compensated and class 2. He will continue GDMT. Tobacco abuse - he is encouraged to stop smoking.  ICD - his Biotronik VDD ICD is working normally.   James Overlie Dorn [place]orne,MD

## 2021-11-29 NOTE — Patient Instructions (Signed)
Medication Instructions:  Your physician recommends that you continue on your current medications as directed. Please refer to the Current Medication list given to you today.   Labwork: None today  Testing/Procedures: None today  Follow-Up: 1 year  Any Other Special Instructions Will Be Listed Below (If Applicable).  If you need a refill on your cardiac medications before your next appointment, please call your pharmacy.  

## 2021-12-06 DIAGNOSIS — I10 Essential (primary) hypertension: Secondary | ICD-10-CM | POA: Diagnosis not present

## 2021-12-06 DIAGNOSIS — E119 Type 2 diabetes mellitus without complications: Secondary | ICD-10-CM | POA: Diagnosis not present

## 2021-12-06 DIAGNOSIS — I2510 Atherosclerotic heart disease of native coronary artery without angina pectoris: Secondary | ICD-10-CM | POA: Diagnosis not present

## 2021-12-07 ENCOUNTER — Telehealth (HOSPITAL_COMMUNITY): Payer: Self-pay | Admitting: Emergency Medicine

## 2021-12-07 NOTE — Telephone Encounter (Signed)
Attempted to call patient regarding upcoming cardiac MR appointment. Left message on voicemail with name and callback number Rossie Scarfone RN Navigator Cardiac Imaging  [facility] [phone] Office [phone] Cell  

## 2021-12-08 ENCOUNTER — Ambulatory Visit (HOSPITAL_COMMUNITY): Admission: RE | Admit: 2021-12-08 | Payer: BC Managed Care – PPO | Source: Ambulatory Visit

## 2021-12-08 ENCOUNTER — Telehealth: Payer: Self-pay | Admitting: Cardiovascular Disease

## 2021-12-08 NOTE — Telephone Encounter (Signed)
Called Radiology back and informed them that patient still needs his MRI.

## 2021-12-08 NOTE — Telephone Encounter (Signed)
Calling back to confirm rather patient needs the MRI or not. Patient is suppose to be at the hospital in the next 30 mins or so.  Please advise

## 2021-12-08 NOTE — Telephone Encounter (Signed)
Reaching out about requested MRI for this pt... would like to know if this is still necessary... please advise

## 2021-12-08 NOTE — Progress Notes (Addendum)
Patient did not arrive for his CMR today- per notes- he questions the necessity of the exam post ICD placement.   Please let me / scheduling know if we should persue this test. If so, please talk to patient so he understands the need to proceed with the test.   Thank you, Marchia Bond RN [facility] [phone] Office  [phone] Cell

## 2021-12-10 NOTE — Telephone Encounter (Signed)
Ok to cancel MRI

## 2022-01-08 DIAGNOSIS — E1140 Type 2 diabetes mellitus with diabetic neuropathy, unspecified: Secondary | ICD-10-CM | POA: Diagnosis not present

## 2022-01-08 DIAGNOSIS — I739 Peripheral vascular disease, unspecified: Secondary | ICD-10-CM | POA: Diagnosis not present

## 2022-01-08 DIAGNOSIS — B353 Tinea pedis: Secondary | ICD-10-CM | POA: Diagnosis not present

## 2022-01-08 DIAGNOSIS — B351 Tinea unguium: Secondary | ICD-10-CM | POA: Diagnosis not present

## 2022-02-13 ENCOUNTER — Other Ambulatory Visit (HOSPITAL_COMMUNITY): Payer: Self-pay | Admitting: Cardiology

## 2022-02-19 ENCOUNTER — Ambulatory Visit (INDEPENDENT_AMBULATORY_CARE_PROVIDER_SITE_OTHER): Payer: BC Managed Care – PPO

## 2022-02-19 DIAGNOSIS — I428 Other cardiomyopathies: Secondary | ICD-10-CM | POA: Diagnosis not present

## 2022-02-19 LAB — CUP PACEART REMOTE DEVICE CHECK
Date Time Interrogation Session: 20230508083518
Implantable Lead Implant Date: 20221107
Implantable Lead Location: 753860
Implantable Lead Model: 436909
Implantable Lead Serial Number: 81476621
Implantable Pulse Generator Implant Date: 20221107
Pulse Gen Model: 429525
Pulse Gen Serial Number: 84864435

## 2022-03-06 NOTE — Progress Notes (Signed)
Remote ICD transmission.   

## 2022-03-27 ENCOUNTER — Other Ambulatory Visit (HOSPITAL_COMMUNITY): Payer: Self-pay | Admitting: Cardiology

## 2022-04-09 DIAGNOSIS — B353 Tinea pedis: Secondary | ICD-10-CM | POA: Diagnosis not present

## 2022-04-09 DIAGNOSIS — E1140 Type 2 diabetes mellitus with diabetic neuropathy, unspecified: Secondary | ICD-10-CM | POA: Diagnosis not present

## 2022-04-09 DIAGNOSIS — I739 Peripheral vascular disease, unspecified: Secondary | ICD-10-CM | POA: Diagnosis not present

## 2022-04-09 DIAGNOSIS — B351 Tinea unguium: Secondary | ICD-10-CM | POA: Diagnosis not present

## 2022-04-12 DIAGNOSIS — I10 Essential (primary) hypertension: Secondary | ICD-10-CM | POA: Diagnosis not present

## 2022-04-12 DIAGNOSIS — Z0001 Encounter for general adult medical examination with abnormal findings: Secondary | ICD-10-CM | POA: Diagnosis not present

## 2022-04-12 DIAGNOSIS — F17210 Nicotine dependence, cigarettes, uncomplicated: Secondary | ICD-10-CM | POA: Diagnosis not present

## 2022-04-12 DIAGNOSIS — I5022 Chronic systolic (congestive) heart failure: Secondary | ICD-10-CM | POA: Diagnosis not present

## 2022-04-12 DIAGNOSIS — J449 Chronic obstructive pulmonary disease, unspecified: Secondary | ICD-10-CM | POA: Diagnosis not present

## 2022-04-12 DIAGNOSIS — E1165 Type 2 diabetes mellitus with hyperglycemia: Secondary | ICD-10-CM | POA: Diagnosis not present

## 2022-05-04 ENCOUNTER — Other Ambulatory Visit (HOSPITAL_COMMUNITY): Payer: Self-pay | Admitting: Cardiology

## 2022-05-21 ENCOUNTER — Ambulatory Visit (INDEPENDENT_AMBULATORY_CARE_PROVIDER_SITE_OTHER): Payer: BC Managed Care – PPO

## 2022-05-21 DIAGNOSIS — I4729 Other ventricular tachycardia: Secondary | ICD-10-CM

## 2022-05-21 DIAGNOSIS — I428 Other cardiomyopathies: Secondary | ICD-10-CM | POA: Diagnosis not present

## 2022-05-22 LAB — CUP PACEART REMOTE DEVICE CHECK
Date Time Interrogation Session: 20230808071613
Implantable Lead Implant Date: 20221107
Implantable Lead Location: 753860
Implantable Lead Model: 436909
Implantable Lead Serial Number: 81476621
Implantable Pulse Generator Implant Date: 20221107
Pulse Gen Model: 429525
Pulse Gen Serial Number: 84864435

## 2022-05-29 ENCOUNTER — Telehealth: Payer: Self-pay | Admitting: *Deleted

## 2022-05-29 NOTE — Chronic Care Management (AMB) (Signed)
  Care Coordination  Outreach Note  05/29/2022 Name: James [name] MRN: [id number] DOB: [birth year]   Care Coordination Outreach Attempts  An unsuccessful telephone outreach was attempted today to offer the patient information about available care coordination services as a benefit of their health plan.   Follow Up Plan:  Additional outreach attempts will be made to offer the patient care coordination information and services.   Encounter Outcome:  No Answer  [place]  Direct Dial: [phone]

## 2022-06-06 NOTE — Chronic Care Management (AMB) (Signed)
  Care Coordination  Outreach Note  06/06/2022 Name: Jarmarcus Wambold MRN: [id number] DOB: 12/07/1960   Care Coordination Outreach Attempts  A second unsuccessful outreach was attempted today to offer the patient with information about available care coordination services as a benefit of their health plan.     Follow Up Plan:  Additional outreach attempts will be made to offer the patient care coordination information and services.   Encounter Outcome:  No Answer  [place]  Direct Dial: [phone]

## 2022-06-12 NOTE — Chronic Care Management (AMB) (Signed)
  Care Coordination  Outreach Note  06/12/2022 Name: Anquan Azzarello MRN: [id number] DOB: [birth year]   Care Coordination Outreach Attempts  A third unsuccessful outreach was attempted today to offer the patient with information about available care coordination services as a benefit of their health plan.   Follow Up Plan:  No further outreach attempts will be made at this time. We have been unable to contact the patient to offer or enroll patient in care coordination services  Encounter Outcome:  No Answer  [employer]: [phone]

## 2022-06-20 NOTE — Progress Notes (Signed)
Remote ICD transmission.   

## 2022-06-25 DIAGNOSIS — G4733 Obstructive sleep apnea (adult) (pediatric): Secondary | ICD-10-CM | POA: Diagnosis not present

## 2022-06-25 DIAGNOSIS — F17210 Nicotine dependence, cigarettes, uncomplicated: Secondary | ICD-10-CM | POA: Diagnosis not present

## 2022-06-25 DIAGNOSIS — I5022 Chronic systolic (congestive) heart failure: Secondary | ICD-10-CM | POA: Diagnosis not present

## 2022-06-25 DIAGNOSIS — E119 Type 2 diabetes mellitus without complications: Secondary | ICD-10-CM | POA: Diagnosis not present

## 2022-06-25 DIAGNOSIS — J449 Chronic obstructive pulmonary disease, unspecified: Secondary | ICD-10-CM | POA: Diagnosis not present

## 2022-07-01 ENCOUNTER — Other Ambulatory Visit (HOSPITAL_COMMUNITY): Payer: Self-pay | Admitting: Cardiology

## 2022-07-02 ENCOUNTER — Encounter: Payer: Self-pay | Admitting: *Deleted

## 2022-07-02 ENCOUNTER — Ambulatory Visit
Admission: EM | Admit: 2022-07-02 | Discharge: 2022-07-02 | Disposition: A | Discharge status: Home / Self Care | Payer: BC Managed Care – PPO | Attending: Family Medicine | Admitting: Family Medicine

## 2022-07-02 DIAGNOSIS — R062 Wheezing: Secondary | ICD-10-CM | POA: Insufficient documentation

## 2022-07-02 DIAGNOSIS — Z20822 Contact with and (suspected) exposure to covid-19: Secondary | ICD-10-CM | POA: Diagnosis not present

## 2022-07-02 DIAGNOSIS — J069 Acute upper respiratory infection, unspecified: Secondary | ICD-10-CM | POA: Diagnosis not present

## 2022-07-02 DIAGNOSIS — R059 Cough, unspecified: Secondary | ICD-10-CM | POA: Diagnosis not present

## 2022-07-02 LAB — RESP PANEL BY RT-PCR (FLU A&B, COVID) ARPGX2
Influenza A by PCR: NEGATIVE
Influenza B by PCR: NEGATIVE
SARS Coronavirus 2 by RT PCR: NEGATIVE

## 2022-07-02 MED ORDER — ALBUTEROL SULFATE HFA 108 (90 BASE) MCG/ACT IN AERS
1.0000 | INHALATION_SPRAY | Freq: Four times a day (QID) | RESPIRATORY_TRACT | 0 refills | Status: DC | PRN
Start: 2022-07-02 — End: 2023-08-21

## 2022-07-02 MED ORDER — PROMETHAZINE-DM 6.25-15 MG/5ML PO SYRP: 5.0000 mL | ORAL_SOLUTION | Freq: Four times a day (QID) | ORAL | 0 refills | Status: DC | PRN

## 2022-07-02 MED ORDER — MOLNUPIRAVIR EUA 200MG CAPSULE: 4.0000 | ORAL_CAPSULE | Freq: Two times a day (BID) | ORAL | 0 refills | Status: AC

## 2022-07-02 NOTE — ED Triage Notes (Signed)
Pt states stomachache, fever, congestion, cough body aches started yesterday. He took pepto, tylenol, without relief.

## 2022-07-02 NOTE — ED Provider Notes (Signed)
[facility]    CSN: [id number] Arrival date & time: 07/02/22  1745      History   Chief Complaint No chief complaint on file.   HPI James [name] is a 61 y.o. male.   Patient presenting today with 1 day history of fever, chills, congestion, cough, body aches, abdominal pain.  Denies chest pain, shortness of breath, nausea, vomiting, diarrhea, rashes.  Taking Pepto-Bismol, Tylenol with minimal relief.  No known history of chronic pulmonary disease he is aware of.  No known sick contacts recently.    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   NSVT (nonsustained ventricular tachycardia) 07/21/2021   Pulmonary hypertension, unspecified ([place]) 07/21/2021   Tobacco abuse 07/21/2021   Diabetes mellitus type 2 in nonobese ([place]) 07/21/2021   Stage 2 chronic kidney disease    Acute on chronic combined systolic and diastolic CHF (congestive heart failure) ([place])    Non-recurrent unilateral inguinal hernia without obstruction or gangrene    Hyperlipidemia 11/04/2012   Aortic root dilatation ([place]) 08/05/2012   Cholelithiasis    Obstructive sleep apnea 04/28/2012   Cardiomyopathy, nonischemic ([place]) 04/03/2012   Essential hypertension 08/07/2010   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/07/2010    Past Surgical History:  Procedure Laterality Date   CARDIAC CATHETERIZATION  2002   COLONOSCOPY N/A 03/22/2014   Procedure: COLONOSCOPY;  Surgeon:  Danie Binder, MD;  [facility];  Service: Endoscopy;  Laterality: N/A;  11:15 AM   ICD IMPLANT N/A 08/21/2021   Procedure: ICD IMPLANT;  Surgeon: Evans Lance, MD;  Location: [facility];  Service: Cardiovascular;  Laterality: N/A;   INGUINAL HERNIA REPAIR Right 06/24/2017   Procedure: HERNIA REPAIR INGUINAL ADULT WITH MESH;  Surgeon: Aviva Signs, MD;  Location: [facility];  Service: General;  Laterality: Right;   RIGHT/LEFT HEART CATH AND CORONARY ANGIOGRAPHY N/A 07/19/2021   Procedure: RIGHT/LEFT HEART CATH AND CORONARY ANGIOGRAPHY;  Surgeon: Burnell Blanks, MD;  Location: [facility];  Service: Cardiovascular;  Laterality: N/A;       Home Medications    Prior to Admission medications   Medication Sig Start Date End Date Taking? Authorizing Provider  acetaminophen (TYLENOL) 500 MG tablet Take 1,000 mg by mouth every 6 (six) hours as needed for moderate pain.   Yes [provider]  albuterol (VENTOLIN HFA) 108 (90 Base) MCG/ACT inhaler Inhale 1-2 puffs into the lungs every 6 (six) hours as needed for wheezing or shortness of breath. 07/02/22  Yes Volney American, PA-C  aspirin 81 MG chewable tablet Chew 81 mg by mouth in the morning.   Yes [provider]  carvedilol (COREG) 12.5 MG tablet TAKE 1 TABLET(12.5 MG) BY MOUTH TWICE DAILY 05/04/22  Yes Evans Lance, MD  dapagliflozin propanediol (FARXIGA) 10 MG TABS tablet Take 1 tablet (10 mg total) by mouth daily before breakfast. 07/28/21  Yes Lyda Jester M, PA-C  ENTRESTO 49-51 MG TAKE 1 TABLET BY MOUTH TWICE DAILY 07/02/22  Yes Evans Lance, MD  furosemide (LASIX) 40 MG tablet Take 1 tablet (40 mg total) by mouth every other day. 07/28/21  Yes Rosita Fire, Brittainy M, PA-C  isosorbide mononitrate (IMDUR) 60 MG 24 hr tablet TAKE 1 TABLET(60 MG) BY MOUTH DAILY 03/27/22  Yes Evans Lance, MD  metFORMIN (GLUCOPHAGE) 500 MG tablet Take 500 mg by mouth 2 (two) times daily with a  meal.    Yes [provider]  molnupiravir EUA (LAGEVRIO) 200 mg CAPS capsule Take 4 capsules (800 mg total) by mouth 2 (two) times daily for 5 days. 07/02/22 07/07/22 Yes Volney American, PA-C  promethazine-dextromethorphan (PROMETHAZINE-DM) 6.25-15 MG/5ML syrup Take 5 mLs by mouth 4 (four) times daily as needed. 07/02/22  Yes Volney American, PA-C  sacubitril-valsartan (ENTRESTO) 24-26 MG Take 1 tablet by mouth 2 (two) times daily.   Yes [provider]  spironolactone (ALDACTONE) 25 MG tablet TAKE 1 TABLET(25 MG) BY MOUTH DAILY 02/13/22  Yes Evans Lance, MD    Family History Family History  Problem Relation Age of Onset   Heart attack Father    Hypertension Father    Iron deficiency Father    Coronary artery disease Mother    Heart disease Maternal Grandfather        Also paternal grandfather   Cardiomyopathy Brother        No significant coronary disease by coronary angiography   Thyroid disease Sister    Sudden death Other    Colon cancer Neg Hx     Social History Social History   Tobacco Use   Smoking status: Former    Packs/day: 0.50    Years: 32.00    Total pack years: 16.00    Types: Cigarettes    Start date: 10/15/1982    Quit date: 07/18/2021    Years since quitting: 0.9   Smokeless tobacco: Never   Tobacco comments:    3 cigaretts a day  Vaping Use   Vaping Use: Never used  Substance Use Topics   Alcohol use: No    Alcohol/week: 0.0 standard drinks of alcohol   Drug use: No     Allergies   Patient has no known allergies.   Review of Systems Review of Systems Per HPI  Physical Exam Triage Vital Signs ED Triage Vitals  Enc Vitals Group     BP 07/02/22 1802 (!) 135/90     Pulse Rate 07/02/22 1802 88     Resp 07/02/22 1802 20     Temp 07/02/22 1802 99.8 F (37.7 C)     Temp Source 07/02/22 1802 Oral     SpO2 07/02/22 1802 94 %     Weight --      Height --      Head Circumference --      Peak Flow --      Pain  Score 07/02/22 1800 8     Pain Loc --      Pain Edu? --      Excl. in [place]? --    No data found.  Updated Vital Signs BP (!) 135/90 (BP Location: Right Arm)   Pulse 88   Temp 99.8 F (37.7 C) (Oral)   Resp 20   SpO2 94%   Visual Acuity Right Eye Distance:   Left Eye Distance:   Bilateral Distance:    Right Eye Near:  Left Eye Near:    Bilateral Near:     Physical Exam Vitals and nursing note reviewed.  Constitutional:      Appearance: He is well-developed.  HENT:     Head: Atraumatic.     Right Ear: External ear normal.     Left Ear: External ear normal.     Nose: Rhinorrhea present.     Mouth/Throat:     Mouth: Mucous membranes are moist.     Pharynx: Posterior oropharyngeal erythema present. No oropharyngeal exudate.  Eyes:     Conjunctiva/sclera: Conjunctivae normal.     Pupils: Pupils are equal, round, and reactive to light.  Cardiovascular:     Rate and Rhythm: Normal rate and regular rhythm.     Heart sounds: Normal heart sounds.  Pulmonary:     Effort: Pulmonary effort is normal. No respiratory distress.     Breath sounds: Wheezing present. No rales.     Comments: Trace wheezes bilaterally Musculoskeletal:        General: Normal range of motion.     Cervical back: Normal range of motion and neck supple.  Lymphadenopathy:     Cervical: No cervical adenopathy.  Skin:    General: Skin is warm and dry.  Neurological:     Mental Status: He is alert and oriented to person, place, and time.  Psychiatric:        Behavior: Behavior normal.      UC Treatments / Results  Labs (all labs ordered are listed, but only abnormal results are displayed) Labs Reviewed  RESP PANEL BY RT-PCR (FLU A&B, COVID) ARPGX2    EKG   Radiology No results found.  Procedures Procedures (including critical care time)  Medications Ordered in UC Medications - No data to display  Initial Impression / Assessment and Plan / UC Course  I have reviewed the triage vital  signs and the nursing notes.  Pertinent labs & imaging results that were available during my care of the patient were reviewed by me and considered in my medical decision making (see chart for details).     Vitals and exam overall reassuring today, suspicious for COVID-19 infection.  Respiratory panel pending, we will start molnupiravir, albuterol, Phenergan DM and discussed abortive over-the-counter measures and medications.  Work note given.  Return for any worsening symptoms.  Final Clinical Impressions(s) / UC Diagnoses   Final diagnoses:  Viral URI with cough  Wheezing   Discharge Instructions   None    ED Prescriptions     Medication Sig Dispense Auth. Provider   molnupiravir EUA (LAGEVRIO) 200 mg CAPS capsule Take 4 capsules (800 mg total) by mouth 2 (two) times daily for 5 days. 40 capsule Volney American, [place]   albuterol (VENTOLIN HFA) 108 (90 Base) MCG/ACT inhaler Inhale 1-2 puffs into the lungs every 6 (six) hours as needed for wheezing or shortness of breath. [place], [place]   promethazine-dextromethorphan (PROMETHAZINE-DM) 6.25-15 MG/5ML syrup Take 5 mLs by mouth 4 (four) times daily as needed. 100 mL Volney American, [place]      PDMP not reviewed this encounter.   Volney American, [place] 07/02/22 1900

## 2022-07-06 DIAGNOSIS — I10 Essential (primary) hypertension: Secondary | ICD-10-CM | POA: Diagnosis not present

## 2022-07-06 DIAGNOSIS — E1165 Type 2 diabetes mellitus with hyperglycemia: Secondary | ICD-10-CM | POA: Diagnosis not present

## 2022-07-06 DIAGNOSIS — E7800 Pure hypercholesterolemia, unspecified: Secondary | ICD-10-CM | POA: Diagnosis not present

## 2022-07-06 DIAGNOSIS — N182 Chronic kidney disease, stage 2 (mild): Secondary | ICD-10-CM | POA: Diagnosis not present

## 2022-07-09 ENCOUNTER — Other Ambulatory Visit: Payer: Self-pay | Admitting: *Deleted

## 2022-07-09 DIAGNOSIS — K4090 Unilateral inguinal hernia, without obstruction or gangrene, not specified as recurrent: Secondary | ICD-10-CM

## 2022-07-17 ENCOUNTER — Ambulatory Visit (INDEPENDENT_AMBULATORY_CARE_PROVIDER_SITE_OTHER): Payer: BC Managed Care – PPO | Admitting: General Surgery

## 2022-07-17 ENCOUNTER — Encounter: Payer: Self-pay | Admitting: General Surgery

## 2022-07-17 VITALS — BP 134/84 | HR 71 | Temp 97.7°F | Resp 16 | Ht 78.0 in | Wt 260.0 lb

## 2022-07-17 DIAGNOSIS — K4090 Unilateral inguinal hernia, without obstruction or gangrene, not specified as recurrent: Secondary | ICD-10-CM | POA: Diagnosis not present

## 2022-07-18 NOTE — Progress Notes (Signed)
James [name]; [id number]; 05-15-1961   HPI Patient is a 61 year old black male who was referred to my care by Dr. Legrand Rams for evaluation and treatment of a left inguinal hernia.  Patient is status post a right inguinal herniorrhaphy by myself in 2018.  It was noted at the time that he had a small umbilical hernia and a left inguinal hernia, though they were asymptomatic.  He states that he has occasional discomfort in the left groin region when straining or coughing.  This has been occurring over the past few months.  No nausea or vomiting have been noted.  He has had no episode of incarceration.  Since that time, he has had a significant history of cardiac disease requiring extensive intervention.  His last echo showed an ejection fraction around 25%.  He is also noted to have some pulmonary hypertension.  An AICD is in place. Past Medical History:  Diagnosis Date   CKD (chronic kidney disease) stage 2, GFR 60-89 ml/min    Diabetes mellitus type II, non insulin dependent (HCC)    Dilated aortic root (HCC)    Hypercholesteremia    Hypertension    NICM (nonischemic cardiomyopathy) (HCC)    NSVT (nonsustained ventricular tachycardia) (HCC)    Obstructive sleep apnea    was positive but never given a CPAP   Pulmonary hypertension ([place])    PVC's (premature ventricular contractions)    Tobacco abuse     Past Surgical History:  Procedure Laterality Date   CARDIAC CATHETERIZATION  2002   COLONOSCOPY N/A 03/22/2014   Procedure: COLONOSCOPY;  Surgeon: Danie Binder, MD;  [facility];  Service: Endoscopy;  Laterality: N/A;  11:15 AM   ICD IMPLANT N/A 08/21/2021   Procedure: ICD IMPLANT;  Surgeon: Evans Lance, MD;  Location: [facility];  Service: Cardiovascular;  Laterality: N/A;   INGUINAL HERNIA REPAIR Right 06/24/2017   Procedure: HERNIA REPAIR INGUINAL ADULT WITH MESH;  Surgeon: Aviva Signs, MD;  Location: [facility];  Service: General;  Laterality: Right;   RIGHT/LEFT  HEART CATH AND CORONARY ANGIOGRAPHY N/A 07/19/2021   Procedure: RIGHT/LEFT HEART CATH AND CORONARY ANGIOGRAPHY;  Surgeon: Burnell Blanks, MD;  Location: [facility];  Service: Cardiovascular;  Laterality: N/A;    Family History  Problem Relation Age of Onset   Heart attack Father    Hypertension Father    Iron deficiency Father    Coronary artery disease Mother    Heart disease Maternal Grandfather        Also paternal grandfather   Cardiomyopathy Brother        No significant coronary disease by coronary angiography   Thyroid disease Sister    Sudden death Other    Colon cancer Neg Hx     Current Outpatient Medications on File Prior to Visit  Medication Sig Dispense Refill   acetaminophen (TYLENOL) 500 MG tablet Take 1,000 mg by mouth every 6 (six) hours as needed for moderate pain.     albuterol (VENTOLIN HFA) 108 (90 Base) MCG/ACT inhaler Inhale 1-2 puffs into the lungs every 6 (six) hours as needed for wheezing or shortness of breath. 18 g 0   aspirin 81 MG chewable tablet Chew 81 mg by mouth in the morning.     carvedilol (COREG) 12.5 MG tablet TAKE 1 TABLET(12.5 MG) BY MOUTH TWICE DAILY 60 tablet 2   dapagliflozin propanediol (FARXIGA) 10 MG TABS tablet Take 1 tablet (10 mg total) by mouth daily before  breakfast. 30 tablet 11   ENTRESTO 49-51 MG TAKE 1 TABLET BY MOUTH TWICE DAILY 60 tablet 6   furosemide (LASIX) 40 MG tablet Take 1 tablet (40 mg total) by mouth every other day. 15 tablet 3   isosorbide mononitrate (IMDUR) 60 MG 24 hr tablet TAKE 1 TABLET(60 MG) BY MOUTH DAILY 30 tablet 10   metFORMIN (GLUCOPHAGE) 500 MG tablet Take 500 mg by mouth 2 (two) times daily with a meal.      promethazine-dextromethorphan (PROMETHAZINE-DM) 6.25-15 MG/5ML syrup Take 5 mLs by mouth 4 (four) times daily as needed. 100 mL 0   sacubitril-valsartan (ENTRESTO) 24-26 MG Take 1 tablet by mouth 2 (two) times daily.     spironolactone (ALDACTONE) 25 MG tablet TAKE 1 TABLET(25 MG) BY  MOUTH DAILY 90 tablet 3   No current facility-administered medications on file prior to visit.    No Known Allergies  Social History   Substance and Sexual Activity  Alcohol Use No   Alcohol/week: 0.0 standard drinks of alcohol    Social History   Tobacco Use  Smoking Status Former   Packs/day: 0.50   Years: 32.00   Total pack years: 16.00   Types: Cigarettes   Start date: 10/15/1982   Quit date: 07/18/2021   Years since quitting: 1.0  Smokeless Tobacco Never  Tobacco Comments   3 cigaretts a day    Review of Systems  Constitutional:  Positive for malaise/fatigue.  HENT:  Positive for sinus pain.   Eyes: Negative.   Respiratory:  Positive for shortness of breath.   Cardiovascular: Negative.   Gastrointestinal:  Positive for abdominal pain.  Genitourinary:  Positive for frequency.  Musculoskeletal: Negative.   Skin: Negative.   Neurological: Negative.   Endo/Heme/Allergies: Negative.   Psychiatric/Behavioral: Negative.      Objective   Vitals:   07/17/22 1536  BP: 134/84  Pulse: 71  Resp: 16  Temp: 97.7 F (36.5 C)  SpO2: 97%    Physical Exam Vitals reviewed.  Constitutional:      Appearance: Normal appearance. He is obese. He is not ill-appearing.  HENT:     Head: Normocephalic and atraumatic.  Cardiovascular:     Rate and Rhythm: Normal rate and regular rhythm.     Heart sounds: Normal heart sounds.     No gallop.  Pulmonary:     Effort: Pulmonary effort is normal. No respiratory distress.     Breath sounds: Normal breath sounds. No stridor. No wheezing, rhonchi or rales.  Abdominal:     General: Bowel sounds are normal. There is no distension.     Palpations: Abdomen is soft. There is no mass.     Tenderness: There is no abdominal tenderness. There is no guarding or rebound.     Hernia: A hernia is present.     Comments: Patient does have an easily reducible large left inguinal hernia.  A small supraumbilical hernia is also present.  That is  also reducible.  Genitourinary:    Testes: Normal.  Skin:    General: Skin is warm and dry.  Neurological:     Mental Status: He is alert and oriented to person, place, and time.    Cardiac history reviewed Assessment  Left inguinal hernia, occasionally symptomatic but no evidence of incarceration.  Patient has a significant cardiac history that would require cardiac clearance prior to any surgical intervention.  That being said, he needs to be done at a larger facility as [facility] does not have  the ancillary support for this patient with a complex medical and cardiac history.  This was explained to the patient.  I told him to let his cardiologist know that he may need surgical intervention.  We can refer him to [facility] should he need a referral for their assessment.  Follow-up here as needed.  Literature was given. Plan

## 2022-07-22 ENCOUNTER — Other Ambulatory Visit: Payer: Self-pay

## 2022-07-22 ENCOUNTER — Encounter (HOSPITAL_COMMUNITY): Payer: Self-pay

## 2022-07-22 ENCOUNTER — Observation Stay (HOSPITAL_COMMUNITY)
Admission: EM | Admit: 2022-07-22 | Discharge: 2022-07-23 | Disposition: A | Discharge status: Home / Self Care | Payer: BC Managed Care – PPO | Attending: Internal Medicine | Admitting: Internal Medicine

## 2022-07-22 ENCOUNTER — Emergency Department (HOSPITAL_COMMUNITY): Payer: BC Managed Care – PPO

## 2022-07-22 DIAGNOSIS — I130 Hypertensive heart and chronic kidney disease with heart failure and stage 1 through stage 4 chronic kidney disease, or unspecified chronic kidney disease: Secondary | ICD-10-CM | POA: Insufficient documentation

## 2022-07-22 DIAGNOSIS — Z1152 Encounter for screening for COVID-19: Secondary | ICD-10-CM | POA: Insufficient documentation

## 2022-07-22 DIAGNOSIS — Z7982 Long term (current) use of aspirin: Secondary | ICD-10-CM | POA: Diagnosis not present

## 2022-07-22 DIAGNOSIS — I509 Heart failure, unspecified: Secondary | ICD-10-CM | POA: Diagnosis not present

## 2022-07-22 DIAGNOSIS — N179 Acute kidney failure, unspecified: Secondary | ICD-10-CM | POA: Diagnosis not present

## 2022-07-22 DIAGNOSIS — E1122 Type 2 diabetes mellitus with diabetic chronic kidney disease: Secondary | ICD-10-CM | POA: Insufficient documentation

## 2022-07-22 DIAGNOSIS — Z87891 Personal history of nicotine dependence: Secondary | ICD-10-CM | POA: Diagnosis not present

## 2022-07-22 DIAGNOSIS — R059 Cough, unspecified: Secondary | ICD-10-CM | POA: Diagnosis not present

## 2022-07-22 DIAGNOSIS — Z7984 Long term (current) use of oral hypoglycemic drugs: Secondary | ICD-10-CM | POA: Insufficient documentation

## 2022-07-22 DIAGNOSIS — I5023 Acute on chronic systolic (congestive) heart failure: Secondary | ICD-10-CM | POA: Diagnosis not present

## 2022-07-22 DIAGNOSIS — Z95810 Presence of automatic (implantable) cardiac defibrillator: Secondary | ICD-10-CM | POA: Insufficient documentation

## 2022-07-22 DIAGNOSIS — N1832 Chronic kidney disease, stage 3b: Secondary | ICD-10-CM | POA: Diagnosis not present

## 2022-07-22 DIAGNOSIS — R079 Chest pain, unspecified: Secondary | ICD-10-CM | POA: Diagnosis not present

## 2022-07-22 DIAGNOSIS — Z79899 Other long term (current) drug therapy: Secondary | ICD-10-CM | POA: Diagnosis not present

## 2022-07-22 DIAGNOSIS — J189 Pneumonia, unspecified organism: Secondary | ICD-10-CM | POA: Diagnosis not present

## 2022-07-22 DIAGNOSIS — R0602 Shortness of breath: Secondary | ICD-10-CM | POA: Diagnosis not present

## 2022-07-22 LAB — CBC
HCT: 48.5 % (ref 39.0–52.0)
HCT: 50.9 % (ref 39.0–52.0)
Hemoglobin: 16.5 g/dL (ref 13.0–17.0)
Hemoglobin: 17.5 g/dL — ABNORMAL HIGH (ref 13.0–17.0)
MCH: 32.5 pg (ref 26.0–34.0)
MCH: 32.4 pg (ref 26.0–34.0)
MCHC: 34 g/dL (ref 30.0–36.0)
MCHC: 34.4 g/dL (ref 30.0–36.0)
MCV: 95.7 fL (ref 80.0–100.0)
MCV: 94.3 fL (ref 80.0–100.0)
Platelets: 167 10*3/uL (ref 150–400)
Platelets: 179 10*3/uL (ref 150–400)
RBC: 5.07 MIL/uL (ref 4.22–5.81)
RBC: 5.4 MIL/uL (ref 4.22–5.81)
RDW: 14.6 % (ref 11.5–15.5)
RDW: 14.6 % (ref 11.5–15.5)
WBC: 11.7 10*3/uL — ABNORMAL HIGH (ref 4.0–10.5)
WBC: 12.1 10*3/uL — ABNORMAL HIGH (ref 4.0–10.5)
nRBC: 0 % (ref 0.0–0.2)
nRBC: 0 % (ref 0.0–0.2)

## 2022-07-22 LAB — BASIC METABOLIC PANEL
Anion gap: 11 (ref 5–15)
BUN: 31 mg/dL — ABNORMAL HIGH (ref 8–23)
CO2: 23 mmol/L (ref 22–32)
Calcium: 8.6 mg/dL — ABNORMAL LOW (ref 8.9–10.3)
Chloride: 104 mmol/L (ref 98–111)
Creatinine, Ser: 1.64 mg/dL — ABNORMAL HIGH (ref 0.61–1.24)
GFR, Estimated: 47 mL/min — ABNORMAL LOW (ref >60)
Glucose, Bld: 139 mg/dL — ABNORMAL HIGH (ref 70–99)
Potassium: 3.7 mmol/L (ref 3.5–5.1)
Sodium: 138 mmol/L (ref 135–145)

## 2022-07-22 LAB — RESP PANEL BY RT-PCR (FLU A&B, COVID) ARPGX2
Influenza A by PCR: NEGATIVE
Influenza B by PCR: NEGATIVE
SARS Coronavirus 2 by RT PCR: NEGATIVE

## 2022-07-22 LAB — TROPONIN I (HIGH SENSITIVITY)
Troponin I (High Sensitivity): 42 ng/L — ABNORMAL HIGH (ref <18)
Troponin I (High Sensitivity): 41 ng/L — ABNORMAL HIGH (ref <18)

## 2022-07-22 LAB — HEPATIC FUNCTION PANEL
ALT: 50 U/L — ABNORMAL HIGH (ref 0–44)
AST: 24 U/L (ref 15–41)
Albumin: 3.3 g/dL — ABNORMAL LOW (ref 3.5–5.0)
Alkaline Phosphatase: 70 U/L (ref 38–126)
Bilirubin, Direct: 0.4 mg/dL — ABNORMAL HIGH (ref 0.0–0.2)
Indirect Bilirubin: 1.3 mg/dL — ABNORMAL HIGH (ref 0.3–0.9)
Total Bilirubin: 1.7 mg/dL — ABNORMAL HIGH (ref 0.3–1.2)
Total Protein: 6.4 g/dL — ABNORMAL LOW (ref 6.5–8.1)

## 2022-07-22 LAB — GLUCOSE, CAPILLARY: Glucose-Capillary: 115 mg/dL — ABNORMAL HIGH (ref 70–99)

## 2022-07-22 LAB — BRAIN NATRIURETIC PEPTIDE: B Natriuretic Peptide: 3425 pg/mL — ABNORMAL HIGH (ref 0.0–100.0)

## 2022-07-22 LAB — LIPASE, BLOOD: Lipase: 26 U/L (ref 11–51)

## 2022-07-22 LAB — D-DIMER, QUANTITATIVE: D-Dimer, Quant: 1.16 ug/mL-FEU — ABNORMAL HIGH (ref 0.00–0.50)

## 2022-07-22 LAB — CREATININE, SERUM
Creatinine, Ser: 1.62 mg/dL — ABNORMAL HIGH (ref 0.61–1.24)
GFR, Estimated: 48 mL/min — ABNORMAL LOW (ref >60)

## 2022-07-22 MED ORDER — INSULIN ASPART 100 UNIT/ML IJ SOLN
0.0000 [IU] | Freq: Three times a day (TID) | INTRAMUSCULAR | Status: DC
  Administered 2022-07-23: 2 [IU] via SUBCUTANEOUS

## 2022-07-22 MED ORDER — SACUBITRIL-VALSARTAN 49-51 MG PO TABS
1.0000 | ORAL_TABLET | Freq: Two times a day (BID) | ORAL | Status: DC
  Administered 2022-07-22 – 2022-07-23 (×2): 1 via ORAL
  Filled 2022-07-22 (×2): qty 1

## 2022-07-22 MED ORDER — SODIUM CHLORIDE 0.9 % IV SOLN: 500.0000 mg | INTRAVENOUS | Status: DC

## 2022-07-22 MED ORDER — SPIRONOLACTONE 25 MG PO TABS
25.0000 mg | ORAL_TABLET | Freq: Once | ORAL | Status: AC
  Administered 2022-07-23: 25 mg via ORAL
  Filled 2022-07-22: qty 1

## 2022-07-22 MED ORDER — SODIUM CHLORIDE 0.9 % IV SOLN: 1.0000 g | INTRAVENOUS | Status: DC

## 2022-07-22 MED ORDER — SODIUM CHLORIDE 0.9 % IV SOLN: 250.0000 mL | INTRAVENOUS | Status: DC | PRN

## 2022-07-22 MED ORDER — ISOSORBIDE MONONITRATE ER 60 MG PO TB24
60.0000 mg | ORAL_TABLET | Freq: Every day | ORAL | Status: DC
  Administered 2022-07-23: 60 mg via ORAL
  Filled 2022-07-22: qty 1

## 2022-07-22 MED ORDER — SODIUM CHLORIDE 0.9% FLUSH
3.0000 mL | Freq: Two times a day (BID) | INTRAVENOUS | Status: DC
  Administered 2022-07-23: 3 mL via INTRAVENOUS

## 2022-07-22 MED ORDER — ENOXAPARIN SODIUM 60 MG/0.6ML IJ SOSY
60.0000 mg | PREFILLED_SYRINGE | INTRAMUSCULAR | Status: DC
  Administered 2022-07-22: 60 mg via SUBCUTANEOUS
  Filled 2022-07-22: qty 0.6

## 2022-07-22 MED ORDER — IOHEXOL 350 MG/ML SOLN
75.0000 mL | Freq: Once | INTRAVENOUS | Status: AC | PRN
  Administered 2022-07-22: 75 mL via INTRAVENOUS

## 2022-07-22 MED ORDER — ASPIRIN 81 MG PO CHEW
81.0000 mg | CHEWABLE_TABLET | Freq: Every day | ORAL | Status: DC
  Administered 2022-07-23: 81 mg via ORAL
  Filled 2022-07-22: qty 1

## 2022-07-22 MED ORDER — CARVEDILOL 12.5 MG PO TABS
12.5000 mg | ORAL_TABLET | Freq: Two times a day (BID) | ORAL | Status: DC
  Administered 2022-07-22 – 2022-07-23 (×2): 12.5 mg via ORAL
  Filled 2022-07-22 (×2): qty 1

## 2022-07-22 MED ORDER — SODIUM CHLORIDE 0.9 % IV SOLN
1.0000 g | Freq: Once | INTRAVENOUS | Status: AC
  Administered 2022-07-22: 1 g via INTRAVENOUS
  Filled 2022-07-22: qty 10

## 2022-07-22 MED ORDER — ACETAMINOPHEN 325 MG PO TABS: 650.0000 mg | ORAL_TABLET | ORAL | Status: DC | PRN

## 2022-07-22 MED ORDER — AZITHROMYCIN 250 MG PO TABS
500.0000 mg | ORAL_TABLET | Freq: Once | ORAL | Status: AC
  Administered 2022-07-22: 500 mg via ORAL
  Filled 2022-07-22: qty 2

## 2022-07-22 MED ORDER — SODIUM CHLORIDE 0.9% FLUSH: 3.0000 mL | INTRAVENOUS | Status: DC | PRN

## 2022-07-22 MED ORDER — FUROSEMIDE 10 MG/ML IJ SOLN
40.0000 mg | Freq: Once | INTRAMUSCULAR | Status: AC
  Administered 2022-07-22: 40 mg via INTRAVENOUS
  Filled 2022-07-22: qty 4

## 2022-07-22 MED ORDER — ONDANSETRON HCL 4 MG/2ML IJ SOLN: 4.0000 mg | Freq: Four times a day (QID) | INTRAMUSCULAR | Status: DC | PRN

## 2022-07-22 MED ORDER — FUROSEMIDE 10 MG/ML IJ SOLN
40.0000 mg | Freq: Two times a day (BID) | INTRAMUSCULAR | Status: DC
  Administered 2022-07-23: 40 mg via INTRAVENOUS
  Filled 2022-07-22: qty 4

## 2022-07-22 MED ORDER — ALBUTEROL SULFATE (2.5 MG/3ML) 0.083% IN NEBU: 3.0000 mL | INHALATION_SOLUTION | Freq: Four times a day (QID) | RESPIRATORY_TRACT | Status: DC | PRN

## 2022-07-22 MED ORDER — ORAL CARE MOUTH RINSE: 15.0000 mL | OROMUCOSAL | Status: DC | PRN

## 2022-07-22 NOTE — ED Provider Notes (Signed)
[facility] EMERGENCY DEPARTMENT Provider Note   CSN: [id number] Arrival date & time: 07/22/22  1437     History  Chief Complaint  Patient presents with   Migraine   Chest Pain   Anxiety    James [name] is a 61 y.o. male.   Migraine Associated symptoms include chest pain and shortness of breath. Pertinent negatives include no abdominal pain and no headaches.  Chest Pain Associated symptoms: anxiety, cough and shortness of breath   Associated symptoms: no abdominal pain, no dizziness, no fever, no headache, no nausea, no vomiting and no weakness   Anxiety Associated symptoms include chest pain and shortness of breath. Pertinent negatives include no abdominal pain and no headaches.        James [name] is a 61 y.o. male with past medical history of hypertension, CKD, nonischemic cardiomyopathy, type 2 diabetes, ASCVD with ICD placed in 2022 who presents to the Emergency Department complaining of chest pain, lower extremity edema, cough, and shortness of breath with exertion.  Chest pain described as a "tightness" to middle of his chest.  Symptoms have been present for several weeks, not improving.  Endorses cough that is productive of yellow sputum.  He denies any orthopnea.  His symptoms have been associated with feeling "anxious."  He was seen at urgent care approximately 3 weeks ago for his cough and congestion.  Was prescribed inhaler which he uses without significant relief.  States his COVID test was negative.  His symptoms have been gradually worsening since that time.  Also states he has lost sense of taste or smell.  He denies any fever, chills, diarrhea, vomiting, neck jaw or arm pain.   Home Medications Prior to Admission medications   Medication Sig Start Date End Date Taking? Authorizing Provider  acetaminophen (TYLENOL) 500 MG tablet Take 1,000 mg by mouth every 6 (six) hours as needed for moderate pain.    [provider]  albuterol (VENTOLIN HFA)  108 (90 Base) MCG/ACT inhaler Inhale 1-2 puffs into the lungs every 6 (six) hours as needed for wheezing or shortness of breath. 07/02/22   Volney American, PA-C  aspirin 81 MG chewable tablet Chew 81 mg by mouth in the morning.    [provider]  carvedilol (COREG) 12.5 MG tablet TAKE 1 TABLET(12.5 MG) BY MOUTH TWICE DAILY 05/04/22   Evans Lance, MD  dapagliflozin propanediol (FARXIGA) 10 MG TABS tablet Take 1 tablet (10 mg total) by mouth daily before breakfast. 07/28/21   Lyda Jester M, PA-C  ENTRESTO 49-51 MG TAKE 1 TABLET BY MOUTH TWICE DAILY 07/02/22   Evans Lance, MD  furosemide (LASIX) 40 MG tablet Take 1 tablet (40 mg total) by mouth every other day. 07/28/21   Lyda Jester M, PA-C  isosorbide mononitrate (IMDUR) 60 MG 24 hr tablet TAKE 1 TABLET(60 MG) BY MOUTH DAILY 03/27/22   Evans Lance, MD  metFORMIN (GLUCOPHAGE) 500 MG tablet Take 500 mg by mouth 2 (two) times daily with a meal.     [provider]  promethazine-dextromethorphan (PROMETHAZINE-DM) 6.25-15 MG/5ML syrup Take 5 mLs by mouth 4 (four) times daily as needed. 07/02/22   Volney American, PA-C  sacubitril-valsartan (ENTRESTO) 24-26 MG Take 1 tablet by mouth 2 (two) times daily.    [provider]  spironolactone (ALDACTONE) 25 MG tablet TAKE 1 TABLET(25 MG) BY MOUTH DAILY 02/13/22   Evans Lance, MD      Allergies    Patient has  no known allergies.    Review of Systems   Review of Systems  Constitutional:  Negative for appetite change, chills and fever.  HENT:  Negative for congestion and sore throat.   Respiratory:  Positive for cough and shortness of breath.   Cardiovascular:  Positive for chest pain and leg swelling.  Gastrointestinal:  Negative for abdominal pain, nausea and vomiting.  Genitourinary:  Negative for dysuria.  Skin:  Negative for rash and wound.  Neurological:  Negative for dizziness, weakness and headaches.  Psychiatric/Behavioral:  The  patient is nervous/anxious.     Physical Exam Updated Vital Signs BP 127/83 (BP Location: Right Arm)   Pulse (!) 59   Temp 97.9 F (36.6 C) (Oral)   Resp 20   Ht '6\' 6"'$  (1.981 m)   Wt 117.5 kg   SpO2 98%   BMI 29.93 kg/m  Physical Exam Vitals and nursing note reviewed.  Constitutional:      General: He is not in acute distress.    Appearance: He is well-developed. He is not ill-appearing or toxic-appearing.  HENT:     Nose: Nose normal.     Mouth/Throat:     Mouth: Mucous membranes are moist.     Pharynx: Oropharynx is clear. No oropharyngeal exudate or posterior oropharyngeal erythema.  Cardiovascular:     Rate and Rhythm: Normal rate and regular rhythm.     Pulses: Normal pulses.  Pulmonary:     Effort: No respiratory distress.     Comments: Lung sounds diminished bilaterally.  No rales or wheexes Abdominal:     General: There is no distension.     Palpations: Abdomen is soft.     Tenderness: There is no abdominal tenderness. There is no guarding.  Musculoskeletal:        General: Normal range of motion.     Cervical back: Normal range of motion. No tenderness.     Right lower leg: Edema present.     Left lower leg: Edema present.  Skin:    General: Skin is warm.     Capillary Refill: Capillary refill takes less than 2 seconds.     Findings: No rash.  Neurological:     General: No focal deficit present.     Mental Status: He is alert.     Sensory: No sensory deficit.     Motor: No weakness.     ED Results / Procedures / Treatments   Labs (all labs ordered are listed, but only abnormal results are displayed) Labs Reviewed  BASIC METABOLIC PANEL - Abnormal; Notable for the following components:      Result Value   Glucose, Bld 139 (*)    BUN 31 (*)    Creatinine, Ser 1.64 (*)    Calcium 8.6 (*)    GFR, Estimated 47 (*)    All other components within normal limits  CBC - Abnormal; Notable for the following components:   WBC 11.7 (*)    All other  components within normal limits  BRAIN NATRIURETIC PEPTIDE - Abnormal; Notable for the following components:   B Natriuretic Peptide 3,425.0 (*)    All other components within normal limits  D-DIMER, QUANTITATIVE - Abnormal; Notable for the following components:   D-Dimer, Quant 1.16 (*)    All other components within normal limits  TROPONIN I (HIGH SENSITIVITY) - Abnormal; Notable for the following components:   Troponin I (High Sensitivity) 42 (*)    All other components within normal limits  RESP PANEL  BY RT-PCR (FLU A&B, COVID) [id number]  HEPATIC FUNCTION PANEL  LIPASE, BLOOD    EKG EKG Interpretation  Date/Time:  Sunday July 22 2022 14:47:23 EDT Ventricular Rate:  64 PR Interval:  152 QRS Duration: 92 QT Interval:  462 QTC Calculation: 476 R Axis:   -62 Text Interpretation: Normal sinus rhythm Left anterior fascicular block Minimal voltage criteria for LVH, may be normal variant ( Cornell product ) Anterior infarct (cited on or before 19-Jul-2021) Abnormal ECG When compared with ECG of 21-Aug-2021 12:45, No significant change was found Confirmed by Noemi Chapel [phone]) on 07/22/2022 4:21:43 PM  Radiology CT Angio Chest PE W and/or Wo Contrast  Result Date: 07/22/2022 CLINICAL DATA:  Chest pain, bilateral lower extremity swelling, shortness of breath EXAM: CT ANGIOGRAPHY CHEST WITH CONTRAST TECHNIQUE: Multidetector CT imaging of the chest was performed using the standard protocol during bolus administration of intravenous contrast. Multiplanar CT image reconstructions and MIPs were obtained to evaluate the vascular anatomy. RADIATION DOSE REDUCTION: This exam was performed according to the departmental dose-optimization program which includes automated exposure control, adjustment of the mA and/or kV according to patient size and/or use of iterative reconstruction technique. CONTRAST:  45m OMNIPAQUE IOHEXOL 350 MG/ML SOLN COMPARISON:  Chest x-ray July 22, 2022 FINDINGS:  Cardiovascular: Satisfactory opacification of the pulmonary arteries to the segmental level. No evidence of pulmonary embolism. Normal heart size. No pericardial effusion. Mediastinum/Nodes: No enlarged mediastinal, hilar, or axillary lymph nodes. Thyroid gland, trachea, and esophagus demonstrate no significant findings. Lungs/Pleura: Patent central airways. Patchy mixed consolidative and ground-glass opacities within the dependent aspects of the left lower lobe and inferior aspect of the left upper lobe. Focal left pleural thickening along the superior/posterior aspect of the left lower lobe (series 5, image 125). No suspicious pulmonary nodules or masses. No pneumothorax. Upper Abdomen: No acute abnormality. Musculoskeletal: Mild bilateral gynecomastia. No acute or significant osseous findings. Review of the MIP images confirms the above findings. IMPRESSION: 1. No acute pulmonary embolism. 2. Patchy mixed consolidative and ground-glass opacities within the dependent left upper and lower lobes, concerning for aspiration and/or multifocal infection. Electronically Signed   By: MBeryle FlockM.D.   On: 07/22/2022 17:29   DG Chest 2 View  Result Date: 07/22/2022 CLINICAL DATA:  Acute chest pain shortness of breath and cough. EXAM: CHEST - 2 VIEW COMPARISON:  08/21/2021 chest radiograph and prior studies FINDINGS: UPPER limits normal heart size and LEFT ICD again noted. Patchy mid and LOWER LEFT lung opacities are suspicious for infection/pneumonia. Mild chronic peribronchial thickening again noted. There is no evidence of pneumothorax or pleural effusion. IMPRESSION: Patchy mid and LOWER LEFT lung opacities suspicious for infection/pneumonia. Electronically Signed   By: JMargarette CanadaM.D.   On: 07/22/2022 15:12    Procedures Procedures    Medications Ordered in ED Medications - No data to display  ED Course/ Medical Decision Making/ A&P                           Medical Decision Making Patient here  for evaluation of persistent chest pain, dyspnea on exertion and feelings of anxiety.  Symptoms have been present for 3 weeks.  Negative COVID test 3 weeks ago.  He also endorses some upper abdominal pain that has not been associated with food intake, nausea, vomiting, or diarrhea.  Has cardiac history with cardiac cath and ICD placed last year.  Has routine cardiology follow-up.  On exam, patient well-appearing nontoxic.  Vital signs are reassuring.  No hypoxia, tachypnea or tachycardia.  Actively coughing but lung sounds are clear to auscultation bilaterally.  There is some mild peripheral edema bilaterally.  Differential diagnosis at this time would include but not limited to ACS, CHF, PE, pneumonia, COVID, atypical chest pain.  Amount and/or Complexity of Data Reviewed Labs: ordered.    Details: Labs interpreted by me, mild leukocytosis with white count of 11,700.  Chemistries show mild progression of his CKD without acute AKI.  Initial troponin elevated at 42, delta troponin delta troponin 41.  COVID and influenza test negative.  BNP elevated at 3425 and D-dimer also elevated at 1.16 Radiology: ordered.    Details: 2 view chest x-ray shows patchy mid and left lower opacity suspicious for pneumonia Given patient's elevated dimer despite age-adjusted rule, CT angio chest obtained for further evaluation of possible PE. CT angio of the chest without evidence of acute pulmonary embolism.  It does show patchy groundglass opacities in the left upper and lower lobes concerning for aspiration versus multifocal infection. ECG/medicine tests: ordered.    Details: Normal sinus rhythm with anterior fascicular block. Discussion of management or test interpretation with external provider(s): Patient here with chest tightness and dyspnea on exertion with cough.  No reported fever.  Symptoms have been present for 3 weeks. His work-up today shows likely pneumonia.  There is no evidence of hypoxia and CT angio of  the chest without evidence of PE.  He does have an elevated BNP with some peripheral edema.  He had echocardiogram 1 year ago that showed EF of 25 to 30%.  He was given IV Lasix here and has had 2 L urine output.  Also given IV Rocephin and oral Zithromax.  On recheck, he is resting comfortably, no acute respiratory distress noted.  He also notes some improvement of his breathing at rest.  He will require hospital admission for his pneumonia and CHF.   Risk Risk Details: Discussed findings with [facility] hospitalist, Dr. Darrick Meigs who agrees to admit.           Final Clinical Impression(s) / ED Diagnoses Final diagnoses:  Community acquired pneumonia, unspecified laterality  Acute congestive heart failure, unspecified heart failure type [facility])    Rx / DC Orders ED Discharge Orders     None         Kem Parkinson, PA-C 07/22/22 1858    Noemi Chapel, MD 07/23/22 [phone]

## 2022-07-22 NOTE — H&P (Signed)
TRH H&P    Patient Demographics:    James [name], is a 61 y.o. male  MRN: [id number]  DOB - 09/26/1961  Admit Date - 07/22/2022  Referring MD/NP/PA: Kem Parkinson  Outpatient Primary MD for the patient is Carrolyn Meiers, MD  Patient coming from: Home  Chief complaint-shortness of breath   HPI:    James [name]  is a 61 y.o. male, with medical history of hypertension, nonischemic cardiomyopathy, EF of 25 to 30% as per echo from 2022.  S/p AICD placed in 2022, diabetes mellitus type 2, CKD stage IIIb presented to the ED with complaints of shortness of breath for the past 3 weeks, worsening lower extremity edema, cough.  Patient states that he has had the symptoms for past 3 weeks and has noted that he will get short of breath on exertion and also will get short of breath while lying in recliner sometimes.  Also has been complaining of coughing up phlegm initially was red-colored, then became clear and yellow.  He also had 1 episode of severe chills.  Patient was seen at urgent care 3 weeks ago for his cough and congestion at that time he was prescribed inhaler without significant relief.  At that time he was tested for COVID-19 which was negative.  In the ED lab work showed BNP 3425, troponin 42, 41. CTA chest showed possibility of multifocal pneumonia  Patient was given Lasix 40 mg IV in the ED with brisk diuresis of 2 L of urine.  Also prescribed ceftriaxone and Zithromax for possible pneumonia.  He complains of loose stools x2 this morning Has intermittent epigastric pain Denies nausea vomiting or diarrhea    Review of systems:    In addition to the HPI above,   All other systems reviewed and are negative.    Past History of the following :    Past Medical History:  Diagnosis Date   CKD (chronic kidney disease) stage 2, GFR 60-89 ml/min    Diabetes mellitus type II, non insulin  dependent (HCC)    Dilated aortic root (HCC)    Hypercholesteremia    Hypertension    NICM (nonischemic cardiomyopathy) (HCC)    NSVT (nonsustained ventricular tachycardia) (HCC)    Obstructive sleep apnea    was positive but never given a CPAP   Pulmonary hypertension ([place])    PVC's (premature ventricular contractions)    Tobacco abuse       Past Surgical History:  Procedure Laterality Date   CARDIAC CATHETERIZATION  2002   COLONOSCOPY N/A 03/22/2014   Procedure: COLONOSCOPY;  Surgeon: Danie Binder, MD;  [facility];  Service: Endoscopy;  Laterality: N/A;  11:15 AM   ICD IMPLANT N/A 08/21/2021   Procedure: ICD IMPLANT;  Surgeon: Evans Lance, MD;  Location: [facility];  Service: Cardiovascular;  Laterality: N/A;   INGUINAL HERNIA REPAIR Right 06/24/2017   Procedure: HERNIA REPAIR INGUINAL ADULT WITH MESH;  Surgeon: Aviva Signs, MD;  Location: [facility];  Service: General;  Laterality: Right;   RIGHT/LEFT  HEART CATH AND CORONARY ANGIOGRAPHY N/A 07/19/2021   Procedure: RIGHT/LEFT HEART CATH AND CORONARY ANGIOGRAPHY;  Surgeon: Burnell Blanks, MD;  Location: [facility];  Service: Cardiovascular;  Laterality: N/A;      Social History:      Social History   Tobacco Use   Smoking status: Former    Packs/day: 0.50    Years: 32.00    Total pack years: 16.00    Types: Cigarettes    Start date: 10/15/1982    Quit date: 07/18/2021    Years since quitting: 1.0   Smokeless tobacco: Never   Tobacco comments:    3 cigaretts a day  Substance Use Topics   Alcohol use: No    Alcohol/week: 0.0 standard drinks of alcohol       Family History :     Family History  Problem Relation Age of Onset   Heart attack Father    Hypertension Father    Iron deficiency Father    Coronary artery disease Mother    Heart disease Maternal Grandfather        Also paternal grandfather   Cardiomyopathy Brother        No significant coronary disease by coronary  angiography   Thyroid disease Sister    Sudden death Other    Colon cancer Neg Hx       Home Medications:   Prior to Admission medications   Medication Sig Start Date End Date Taking? Authorizing Provider  acetaminophen (TYLENOL) 500 MG tablet Take 1,000 mg by mouth every 6 (six) hours as needed for moderate pain.   Yes [provider]  albuterol (VENTOLIN HFA) 108 (90 Base) MCG/ACT inhaler Inhale 1-2 puffs into the lungs every 6 (six) hours as needed for wheezing or shortness of breath. 07/02/22  Yes Volney American, PA-C  aspirin 81 MG chewable tablet Chew 81 mg by mouth in the morning.   Yes [provider]  carvedilol (COREG) 12.5 MG tablet TAKE 1 TABLET(12.5 MG) BY MOUTH TWICE DAILY Patient taking differently: Take 12.5 mg by mouth 2 (two) times daily with a meal. 05/04/22  Yes Evans Lance, MD  dapagliflozin propanediol (FARXIGA) 10 MG TABS tablet Take 1 tablet (10 mg total) by mouth daily before breakfast. 07/28/21  Yes Lyda Jester M, PA-C  ENTRESTO 49-51 MG TAKE 1 TABLET BY MOUTH TWICE DAILY 07/02/22  Yes Evans Lance, MD  furosemide (LASIX) 40 MG tablet Take 1 tablet (40 mg total) by mouth every other day. Patient taking differently: Take 40 mg by mouth daily. 07/28/21  Yes Rosita Fire, Brittainy M, PA-C  isosorbide mononitrate (IMDUR) 60 MG 24 hr tablet TAKE 1 TABLET(60 MG) BY MOUTH DAILY Patient taking differently: Take 60 mg by mouth daily. 03/27/22  Yes Evans Lance, MD  Meclizine HCl 25 MG CHEW Chew 1 tablet by mouth 3 (three) times daily as needed. 03/29/22  Yes [provider]  metFORMIN (GLUCOPHAGE) 500 MG tablet Take 500 mg by mouth 2 (two) times daily with a meal.    Yes [provider]  spironolactone (ALDACTONE) 25 MG tablet TAKE 1 TABLET(25 MG) BY MOUTH DAILY Patient taking differently: Take 25 mg by mouth once. 02/13/22  Yes Evans Lance, MD     Allergies:    No Known Allergies   Physical Exam:    Vitals  Blood pressure 119/82, pulse 63, temperature 98.6 F (37 C), temperature source Oral, resp. rate 19, height '6\' 6"'$  (1.981 m), weight 117.5  kg, SpO2 100 %.  1.  General: Appears in no acute distress  2. Psychiatric: Alert, oriented x3, intact insight and judgment  3. Neurologic: Cranial nerves II through XII grossly intact, no focal deficit noted  4. HEENMT:  Atraumatic normocephalic, extraocular muscles are intact  5. Respiratory : Bilateral rhonchi auscultated  6. Cardiovascular : S1-S2, regular, no murmur auscultated, bilateral 2+ pitting edema of lower extremities  7. Gastrointestinal:  Abdomen is soft, mild epigastric tenderness to palpation  8. Skin:  No rashes noted      Data Review:    CBC Recent Labs  Lab 07/22/22 1459  WBC 11.7*  HGB 16.5  HCT 48.5  PLT 167  MCV 95.7  MCH 32.5  MCHC 34.0  RDW 14.6   ------------------------------------------------------------------------------------------------------------------  Results for orders placed or performed during the hospital encounter of 07/22/22 (from the past 48 hour(s))  Hepatic function panel     Status: Abnormal   Collection Time: 07/22/22  2:57 PM  Result Value Ref Range   Total Protein 6.4 (L) 6.5 - 8.1 g/dL   Albumin 3.3 (L) 3.5 - 5.0 g/dL   AST 24 15 - 41 U/L   ALT 50 (H) 0 - 44 U/L   Alkaline Phosphatase 70 38 - 126 U/L   Total Bilirubin 1.7 (H) 0.3 - 1.2 mg/dL   Bilirubin, Direct 0.4 (H) 0.0 - 0.2 mg/dL   Indirect Bilirubin 1.3 (H) 0.3 - 0.9 mg/dL    Comment: Performed at [facility], [address] [place], [place] [address]  Lipase, blood     Status: None   Collection Time: 07/22/22  2:57 PM  Result Value Ref Range   Lipase 26 11 - 51 U/L    Comment: Performed at [facility], [address], [place], [place] [address]  Basic metabolic panel     Status: Abnormal   Collection Time: 07/22/22  2:59 PM  Result Value Ref Range   Sodium 138 135 - 145 mmol/L   Potassium 3.7  3.5 - 5.1 mmol/L   Chloride 104 98 - 111 mmol/L   CO2 23 22 - 32 mmol/L   Glucose, Bld 139 (H) 70 - 99 mg/dL    Comment: Glucose reference range applies only to samples taken after fasting for at least 8 hours.   BUN 31 (H) 8 - 23 mg/dL   Creatinine, Ser 1.64 (H) 0.61 - 1.24 mg/dL   Calcium 8.6 (L) 8.9 - 10.3 mg/dL   GFR, Estimated 47 (L) >60 mL/min    Comment: (NOTE) Calculated using the CKD-EPI Creatinine Equation (2021)    Anion gap 11 5 - 15    Comment: Performed at [facility], [address], [place], [place] [address]  CBC     Status: Abnormal   Collection Time: 07/22/22  2:59 PM  Result Value Ref Range   WBC 11.7 (H) 4.0 - 10.5 K/uL   RBC 5.07 4.22 - 5.81 MIL/uL   Hemoglobin 16.5 13.0 - 17.0 g/dL   HCT 48.5 39.0 - 52.0 %   MCV 95.7 80.0 - 100.0 fL   MCH 32.5 26.0 - 34.0 pg   MCHC 34.0 30.0 - 36.0 g/dL   RDW 14.6 11.5 - 15.5 %   Platelets 167 150 - 400 K/uL   nRBC 0.0 0.0 - 0.2 %    Comment: Performed at [facility], [address] [place], [place] [address]  Troponin I (High Sensitivity)     Status: Abnormal   Collection Time: 07/22/22  2:59 PM  Result Value Ref Range   Troponin I (High Sensitivity) 42 (H) <18 ng/L    Comment: (NOTE) Elevated high sensitivity troponin I (hsTnI) values and significant  changes across serial measurements may suggest ACS but many other  chronic and acute conditions are known to elevate hsTnI results.  Refer to the "Links" section for chest pain algorithms and additional  guidance. Performed at [facility], [address], [place], [place] [address]   Brain natriuretic peptide     Status: Abnormal   Collection Time: 07/22/22  3:17 PM  Result Value Ref Range   B Natriuretic Peptide 3,425.0 (H) 0.0 - 100.0 pg/mL    Comment: Performed at [facility], [address] [place], [place] [address]  D-dimer, quantitative     Status: Abnormal   Collection Time: 07/22/22  3:18 PM  Result Value Ref Range   D-Dimer, Quant 1.16 (H) 0.00 - 0.50  ug/mL-FEU    Comment: (NOTE) At the manufacturer cut-off value of 0.5 g/mL FEU, this assay has a negative predictive value of 95-100%.This assay is intended for use in conjunction with a clinical pretest probability (PTP) assessment model to exclude pulmonary embolism (PE) and deep venous thrombosis (DVT) in outpatients suspected of PE or DVT. Results should be correlated with clinical presentation. Performed at [facility], [address] [place], [place] [address]   Resp Panel by RT-PCR (Flu A&B, Covid) Anterior Nasal Swab     Status: None   Collection Time: 07/22/22  3:19 PM   Specimen: Anterior Nasal Swab  Result Value Ref Range   SARS Coronavirus 2 by RT PCR NEGATIVE NEGATIVE    Comment: (NOTE) SARS-CoV-2 target nucleic acids are NOT DETECTED.  The SARS-CoV-2 RNA is generally detectable in upper respiratory specimens during the acute phase of infection. The lowest concentration of SARS-CoV-2 viral copies this assay can detect is 138 copies/mL. A negative result does not preclude SARS-Cov-2 infection and should not be used as the sole basis for treatment or other patient management decisions. A negative result may occur with  improper specimen collection/handling, submission of specimen other than nasopharyngeal swab, presence of viral mutation(s) within the areas targeted by this assay, and inadequate number of viral copies(<138 copies/mL). A negative result must be combined with clinical observations, patient history, and epidemiological information. The expected result is Negative.  Fact Sheet for Patients:  EntrepreneurPulse.com.au  Fact Sheet for Healthcare Providers:  IncredibleEmployment.be  This test is no t yet approved or cleared by the [place] FDA and  has been authorized for detection and/or diagnosis of SARS-CoV-2 by FDA under an Emergency Use Authorization (EUA). This EUA will remain  in effect (meaning this test can  be used) for the duration of the COVID-19 declaration under Section 564(b)(1) of the Act, 21 U.S.C.section 360bbb-3(b)(1), unless the authorization is terminated  or revoked sooner.       Influenza A by PCR NEGATIVE NEGATIVE   Influenza B by PCR NEGATIVE NEGATIVE    Comment: (NOTE) The Xpert Xpress SARS-CoV-2/FLU/RSV plus assay is intended as an aid in the diagnosis of influenza from Nasopharyngeal swab specimens and should not be used as a sole basis for treatment. Nasal washings and aspirates are unacceptable for Xpert Xpress SARS-CoV-2/FLU/RSV testing.  Fact Sheet for Patients: EntrepreneurPulse.com.au  Fact Sheet for Healthcare Providers: IncredibleEmployment.be  This test is not yet approved or cleared by the [place] FDA and has been authorized for detection and/or diagnosis of SARS-CoV-2 by FDA under an Emergency Use  Authorization (EUA). This EUA will remain in effect (meaning this test can be used) for the duration of the COVID-19 declaration under Section 564(b)(1) of the Act, 21 U.S.C. section 360bbb-3(b)(1), unless the authorization is terminated or revoked.  Performed at [facility], [address], [place], [place] [address]   Troponin I (High Sensitivity)     Status: Abnormal   Collection Time: 07/22/22  4:44 PM  Result Value Ref Range   Troponin I (High Sensitivity) 41 (H) <18 ng/L    Comment: (NOTE) Elevated high sensitivity troponin I (hsTnI) values and significant  changes across serial measurements may suggest ACS but many other  chronic and acute conditions are known to elevate hsTnI results.  Refer to the "Links" section for chest pain algorithms and additional  guidance. Performed at [facility], [address], [place],  [address]     Chemistries  Recent Labs  Lab 07/22/22 1457 07/22/22 1459  NA  --  138  K  --  3.7  CL  --  104  CO2  --  23  GLUCOSE  --  139*  BUN  --  31*  CREATININE  --   1.64*  CALCIUM  --  8.6*  AST 24  --   ALT 50*  --   ALKPHOS 70  --   BILITOT 1.7*  --    ------------------------------------------------------------------------------------------------------------------  ------------------------------------------------------------------------------------------------------------------ GFR: Estimated Creatinine Clearance: 68.1 mL/min (A) (by C-G formula based on SCr of 1.64 mg/dL (H)). Liver Function Tests: Recent Labs  Lab 07/22/22 1457  AST 24  ALT 50*  ALKPHOS 70  BILITOT 1.7*  PROT 6.4*  ALBUMIN 3.3*   Recent Labs  Lab 07/22/22 1457  LIPASE 26   No results for input(s): "AMMONIA" in the last 168 hours. Coagulation Profile: No results for input(s): "INR", "PROTIME" in the last 168 hours. Cardiac Enzymes: No results for input(s): "CKTOTAL", "CKMB", "CKMBINDEX", "TROPONINI" in the last 168 hours. BNP (last 3 results) No results for input(s): "PROBNP" in the last 8760 hours. HbA1C: No results for input(s): "HGBA1C" in the last 72 hours. CBG: No results for input(s): "GLUCAP" in the last 168 hours. Lipid Profile: No results for input(s): "CHOL", "HDL", "LDLCALC", "TRIG", "CHOLHDL", "LDLDIRECT" in the last 72 hours. Thyroid Function Tests: No results for input(s): "TSH", "T4TOTAL", "FREET4", "T3FREE", "THYROIDAB" in the last 72 hours. Anemia Panel: No results for input(s): "VITAMINB12", "FOLATE", "FERRITIN", "TIBC", "IRON", "RETICCTPCT" in the last 72 hours.  --------------------------------------------------------------------------------------------------------------- Urine analysis: No results found for: "COLORURINE", "APPEARANCEUR", "LABSPEC", "PHURINE", "GLUCOSEU", "HGBUR", "BILIRUBINUR", "KETONESUR", "PROTEINUR", "UROBILINOGEN", "NITRITE", "LEUKOCYTESUR"    Imaging Results:    CT Angio Chest PE W and/or Wo Contrast  Result Date: 07/22/2022 CLINICAL DATA:  Chest pain, bilateral lower extremity swelling, shortness of breath  EXAM: CT ANGIOGRAPHY CHEST WITH CONTRAST TECHNIQUE: Multidetector CT imaging of the chest was performed using the standard protocol during bolus administration of intravenous contrast. Multiplanar CT image reconstructions and MIPs were obtained to evaluate the vascular anatomy. RADIATION DOSE REDUCTION: This exam was performed according to the departmental dose-optimization program which includes automated exposure control, adjustment of the mA and/or kV according to patient size and/or use of iterative reconstruction technique. CONTRAST:  59m OMNIPAQUE IOHEXOL 350 MG/ML SOLN COMPARISON:  Chest x-ray July 22, 2022 FINDINGS: Cardiovascular: Satisfactory opacification of the pulmonary arteries to the segmental level. No evidence of pulmonary embolism. Normal heart size. No pericardial effusion. Mediastinum/Nodes: No enlarged mediastinal, hilar, or axillary lymph nodes. Thyroid gland, trachea, and esophagus demonstrate no significant  findings. Lungs/Pleura: Patent central airways. Patchy mixed consolidative and ground-glass opacities within the dependent aspects of the left lower lobe and inferior aspect of the left upper lobe. Focal left pleural thickening along the superior/posterior aspect of the left lower lobe (series 5, image 125). No suspicious pulmonary nodules or masses. No pneumothorax. Upper Abdomen: No acute abnormality. Musculoskeletal: Mild bilateral gynecomastia. No acute or significant osseous findings. Review of the MIP images confirms the above findings. IMPRESSION: 1. No acute pulmonary embolism. 2. Patchy mixed consolidative and ground-glass opacities within the dependent left upper and lower lobes, concerning for aspiration and/or multifocal infection. Electronically Signed   By: Beryle Flock M.D.   On: 07/22/2022 17:29   DG Chest 2 View  Result Date: 07/22/2022 CLINICAL DATA:  Acute chest pain shortness of breath and cough. EXAM: CHEST - 2 VIEW COMPARISON:  08/21/2021 chest radiograph  and prior studies FINDINGS: UPPER limits normal heart size and LEFT ICD again noted. Patchy mid and LOWER LEFT lung opacities are suspicious for infection/pneumonia. Mild chronic peribronchial thickening again noted. There is no evidence of pneumothorax or pleural effusion. IMPRESSION: Patchy mid and LOWER LEFT lung opacities suspicious for infection/pneumonia. Electronically Signed   By: Margarette Canada M.D.   On: 07/22/2022 15:12    My personal review of EKG: Rhythm NSR, no ST changes   Assessment & Plan:    Principal Problem:   Acute exacerbation of CHF (congestive heart failure) (HCC)   Acute on chronic systolic CHF-patient presented with dyspnea on exertion, significantly elevated BNP, bilateral lower extremity edema.  Diuresed well with 1 dose of Lasix given in the ED.  We will continue with Lasix 40 mg IV every 12 hours.  Consider cardiology consultation in a.m.  Will obtain echocardiogram in a.m.  Daily BMP. Community-acquired pneumonia-seen on CT chest.  Started on ceftriaxone and Zithromax.  We will continue with antibiotics.  Follow blood culture results. Acute kidney injury on CKD stage IIIb-patient's creatinine 1.64, slightly up from baseline 1.3 as of last year.  Likely from poor renal perfusion from CHF.  Started on IV Lasix.  Follow BMP in am. Diabetes mellitus type 2-we will start sliding scale insulin with NovoLog.  Hold Farxiga, metformin. History of nonischemic cardiomyopathy-EF 25 to 30% as of echo from October 2022.  Continue Aldactone, Imdur, Coreg, aspirin    DVT Prophylaxis-   Lovenox   AM Labs Ordered, also please review Full Orders  Family Communication: Admission, patients condition and plan of care including tests being ordered have been discussed with the patient and his wife at bedside who indicate understanding and agree with the plan and Code Status.  Code Status: Full code  Admission status: Observation    Time spent in minutes : 60 minutes   Zeva Leber S Kapena Hamme  M.D

## 2022-07-22 NOTE — ED Notes (Signed)
Hospitalist at bedside 

## 2022-07-22 NOTE — ED Triage Notes (Signed)
CP, h/a, anxiety, swelling in lower legs, SOB, coughing up yellow "stuff."   Pt reports not being able to taste his food

## 2022-07-23 ENCOUNTER — Observation Stay (HOSPITAL_BASED_OUTPATIENT_CLINIC_OR_DEPARTMENT_OTHER): Payer: BC Managed Care – PPO

## 2022-07-23 ENCOUNTER — Other Ambulatory Visit (HOSPITAL_COMMUNITY): Payer: Self-pay | Admitting: *Deleted

## 2022-07-23 DIAGNOSIS — I5023 Acute on chronic systolic (congestive) heart failure: Secondary | ICD-10-CM

## 2022-07-23 LAB — GLUCOSE, CAPILLARY
Glucose-Capillary: 117 mg/dL — ABNORMAL HIGH (ref 70–99)
Glucose-Capillary: 193 mg/dL — ABNORMAL HIGH (ref 70–99)
Glucose-Capillary: 143 mg/dL — ABNORMAL HIGH (ref 70–99)

## 2022-07-23 LAB — HEMOGLOBIN A1C
Hgb A1c MFr Bld: 6.3 % — ABNORMAL HIGH (ref 4.8–5.6)
Mean Plasma Glucose: 134.11 mg/dL

## 2022-07-23 LAB — BASIC METABOLIC PANEL
Anion gap: 10 (ref 5–15)
BUN: 25 mg/dL — ABNORMAL HIGH (ref 8–23)
CO2: 28 mmol/L (ref 22–32)
Calcium: 8.3 mg/dL — ABNORMAL LOW (ref 8.9–10.3)
Chloride: 104 mmol/L (ref 98–111)
Creatinine, Ser: 1.46 mg/dL — ABNORMAL HIGH (ref 0.61–1.24)
GFR, Estimated: 54 mL/min — ABNORMAL LOW (ref >60)
Glucose, Bld: 111 mg/dL — ABNORMAL HIGH (ref 70–99)
Potassium: 3.5 mmol/L (ref 3.5–5.1)
Sodium: 142 mmol/L (ref 135–145)

## 2022-07-23 LAB — HIV ANTIBODY (ROUTINE TESTING W REFLEX): HIV Screen 4th Generation wRfx: NONREACTIVE

## 2022-07-23 LAB — ECHOCARDIOGRAM COMPLETE
Area-P 1/2: 4.49 cm2
Calc EF: 13.8 %
Height: 78 in
S' Lateral: 6.1 cm
Single Plane A2C EF: 2.5 %
Single Plane A4C EF: 26.5 %
Weight: 4007.08 oz

## 2022-07-23 MED ORDER — LIVING BETTER WITH HEART FAILURE BOOK: Freq: Once | Status: AC

## 2022-07-23 MED ORDER — AMOXICILLIN-POT CLAVULANATE 500-125 MG PO TABS: 1.0000 | ORAL_TABLET | Freq: Three times a day (TID) | ORAL | 0 refills | Status: AC

## 2022-07-23 MED ORDER — PERFLUTREN LIPID MICROSPHERE
1.0000 mL | INTRAVENOUS | Status: AC | PRN
  Administered 2022-07-23: 2 mL via INTRAVENOUS

## 2022-07-23 NOTE — TOC Progression Note (Signed)
Transition of Care [facility]) - Progression Note    Patient Details  Name: Hillman Attig MRN: [id number] Date of Birth: 02-11-1961  Transition of Care [facility]) CM/SW Contact  Salome Arnt, [place] Phone Number: 07/23/2022, 3:32 PM  Clinical Narrative:  TOC received consult for CHF screening. Living Well with CHF book ordered. TOC will follow for potential d/c needs.           Expected Discharge Plan and Services                                                 Social Determinants of Health (SDOH) Interventions    Readmission Risk Interventions     No data to display

## 2022-07-23 NOTE — Progress Notes (Signed)
  Echocardiogram 2D Echocardiogram has been performed.  James [name] 07/23/2022, 9:11 AM

## 2022-07-23 NOTE — Discharge Summary (Signed)
Physician Discharge Summary  Revin Corker XVQ:[id number] DOB: 1961/08/26 DOA: 07/22/2022  PCP: Carrolyn Meiers, MD  Admit date: 07/22/2022  Discharge date: 07/23/2022  Admitted From:Home  Disposition:  Home  Recommendations for Outpatient Follow-up:  Follow up with PCP in 1-2 weeks Follow-up with cardiologist as previously scheduled and referral sent to heart failure clinic for further follow-up Continue Augmentin for 4 more days to complete course of treatment Continue home medications as prior and take extra dose of Lasix for 3 pound weight gain in 24 hours and call cardiology office  Home Health: None  Equipment/Devices: None  Discharge Condition:Stable  CODE STATUS: Full  Diet recommendation: Heart Healthy/carb modified  Brief/Interim Summary: Demetrice Combes  is a 61 y.o. male, with medical history of hypertension, nonischemic cardiomyopathy, EF of 25 to 30% as per echo from 2022.  S/p AICD placed in 2022, diabetes mellitus type 2, CKD stage IIIb presented to the ED with complaints of shortness of breath for the past 3 weeks, worsening lower extremity edema, cough.  Patient states that he has had the symptoms for past 3 weeks and has noted that he will get short of breath on exertion and also will get short of breath while lying in recliner sometimes.  Patient was a started on IV diuresis for heart failure and is noted to have echocardiogram as noted below which was reviewed with cardiology Dr. Radford Pax.  No need for medication adjustments noted and plan is to follow-up with heart failure clinic outpatient as patient is now euvolemic and is otherwise asymptomatic.  He was started on empiric IV Rocephin and azithromycin and will be transition to Augmentin on discharge to complete course of treatment as noted above.  Referral has been sent by cardiology to heart failure clinic for further follow-up.  No other acute events noted during this brief admission.  Discharge Diagnoses:   Principal Problem:   Acute exacerbation of CHF (congestive heart failure) ([place])  Principal discharge diagnosis: Acute on chronic systolic CHF exacerbation along with community-acquired pneumonia.  Discharge Instructions  Discharge Instructions     Diet - low sodium heart healthy   Complete by: As directed    Increase activity slowly   Complete by: As directed       Allergies as of 07/23/2022   No Known Allergies      Medication List     TAKE these medications    acetaminophen 500 MG tablet Commonly known as: TYLENOL Take 1,000 mg by mouth every 6 (six) hours as needed for moderate pain.   albuterol 108 (90 Base) MCG/ACT inhaler Commonly known as: VENTOLIN HFA Inhale 1-2 puffs into the lungs every 6 (six) hours as needed for wheezing or shortness of breath.   amoxicillin-clavulanate 500-125 MG tablet Commonly known as: Augmentin Take 1 tablet by mouth 3 (three) times daily for 4 days.   aspirin 81 MG chewable tablet Chew 81 mg by mouth in the morning.   carvedilol 12.5 MG tablet Commonly known as: COREG TAKE 1 TABLET(12.5 MG) BY MOUTH TWICE DAILY What changed: See the new instructions.   dapagliflozin propanediol 10 MG Tabs tablet Commonly known as: Farxiga Take 1 tablet (10 mg total) by mouth daily before breakfast.   Entresto 49-51 MG Generic drug: sacubitril-valsartan TAKE 1 TABLET BY MOUTH TWICE DAILY   furosemide 40 MG tablet Commonly known as: LASIX Take 1 tablet (40 mg total) by mouth every other day. What changed: when to take this   isosorbide mononitrate 60 MG 24  hr tablet Commonly known as: IMDUR TAKE 1 TABLET(60 MG) BY MOUTH DAILY What changed: See the new instructions.   Meclizine HCl 25 MG Chew Chew 1 tablet by mouth 3 (three) times daily as needed.   metFORMIN 500 MG tablet Commonly known as: GLUCOPHAGE Take 500 mg by mouth 2 (two) times daily with a meal.   spironolactone 25 MG tablet Commonly known as: ALDACTONE TAKE 1  TABLET(25 MG) BY MOUTH DAILY What changed: See the new instructions.        Follow-up Information     Fanta, Normajean Baxter, MD. Schedule an appointment as soon as possible for a visit in 1 week(s).   Specialty: Internal Medicine Contact information: [place] [address] [phone]         Evans Lance, MD. Go to.   Specialty: Cardiology Contact information: [address] [place] [address] [phone]                No Known Allergies  Consultations: Discussed with cardiology   Procedures/Studies: ECHOCARDIOGRAM COMPLETE  Result Date: 07/23/2022    ECHOCARDIOGRAM REPORT   Patient Name:   JAICEON COLLISTER Date of Exam: 07/23/2022 Medical Rec #:  [id number]         Height:       78.0 in Accession #:    [id number]        Weight:       250.4 lb Date of Birth:  January 07, 1961         BSA:          2.484 m Patient Age:    61 years          BP:           118/79 mmHg Patient Gender: M                 HR:           64 bpm. Exam Location:  Inpatient Procedure: 2D Echo, 3D Echo, Color Doppler, Cardiac Doppler and Intracardiac            Opacification Agent Indications:    I50.40* Unspecified combined systolic (congestive) and diastolic                 (congestive) heart failure  History:        Patient has prior history of Echocardiogram examinations, most                 recent 07/21/2021. CHF and Cardiomyopathy, Angina, Abnormal ECG                 and Defibrillator, Pulmonary HTN, Arrythmias:NSVT,                 Signs/Symptoms:Dyspnea and Edema; Risk Factors:Hypertension,                 Sleep Apnea, Diabetes and Former Smoker.  Sonographer:    Roseanna Rainbow RDCS Referring Phys: [place]  1. Left ventricular ejection fraction, by estimation, is 20 to 25%. The left ventricle has severely decreased function. The left ventricle demonstrates regional wall motion abnormalities (see scoring diagram/findings for  description). The left ventricular internal cavity size was severely dilated. There is mild left ventricular hypertrophy of the basal-septal segment. Left ventricular diastolic function could not be evaluated. Elevated left ventricular end-diastolic pressure. There is akinesis  of the left ventricular, apical segment and severe global hypokinesis.  2. Right ventricular systolic  function is moderately reduced. The right ventricular size is moderately enlarged. There is mildly elevated pulmonary artery systolic pressure. The estimated right ventricular systolic pressure is 49.1 mmHg.  3. Left atrial size was severely dilated.  4. Right atrial size was mild to moderately dilated.  5. The mitral valve is normal in structure. Mild mitral valve regurgitation. No evidence of mitral stenosis.  6. The aortic valve is tricuspid. Aortic valve regurgitation is not visualized. Aortic valve sclerosis/calcification is present, without any evidence of aortic stenosis.  7. Aneurysm of the aortic root, measuring 50 mm. There is mild dilatation of the ascending aorta, measuring 43 mm.  8. The inferior vena cava is dilated in size with <50% respiratory variability, suggesting right atrial pressure of 15 mmHg.  9. 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 FINDINGS  Left Ventricle: Left ventricular ejection fraction, by estimation, is 20 to 25%. The left ventricle has severely decreased function. The left ventricle demonstrates regional wall motion abnormalities. 3D left ventricular ejection fraction analysis performed but not reported based on interpreter judgement due to suboptimal tracking. The left ventricular internal cavity size was severely dilated. There is mild left ventricular hypertrophy of the basal-septal segment. Left ventricular diastolic function could not be evaluated. Elevated left ventricular end-diastolic pressure. Right Ventricle: The right ventricular size is moderately enlarged.  No increase in right ventricular wall thickness. Right ventricular systolic function is moderately reduced. There is mildly elevated pulmonary artery systolic pressure. The tricuspid regurgitant velocity is 2.55 m/s, and with an assumed right atrial pressure of 15 mmHg, the estimated right ventricular systolic pressure is 79.1 mmHg. Left Atrium: Left atrial size was severely dilated. Right Atrium: Right atrial size was mild to moderately dilated. Pericardium: There is no evidence of pericardial effusion. Mitral Valve: The mitral valve is normal in structure. Mild mitral valve regurgitation. No evidence of mitral valve stenosis. Tricuspid Valve: The tricuspid valve is normal in structure. Tricuspid valve regurgitation is trivial. No evidence of tricuspid stenosis. Aortic Valve: The aortic valve is tricuspid. Aortic valve regurgitation is not visualized. Aortic valve sclerosis/calcification is present, without any evidence of aortic stenosis. Pulmonic Valve: The pulmonic valve was normal in structure. Pulmonic valve regurgitation is not visualized. No evidence of pulmonic stenosis. Aorta: The aortic root is normal in size and structure. There is mild dilatation of the ascending aorta, measuring 43 mm. There is an aneurysm involving the aortic root measuring 50 mm. Venous: The inferior vena cava is dilated in size with less than 50% respiratory variability, suggesting right atrial pressure of 15 mmHg. IAS/Shunts: There is right bowing of the interatrial septum, suggestive of elevated left atrial pressure. No atrial level shunt detected by color flow Doppler. Additional Comments: A device lead is visualized.  LEFT VENTRICLE PLAX 2D LVIDd:         7.10 cm      Diastology LVIDs:         6.10 cm      LV e' medial:    3.50 cm/s LV PW:         1.10 cm      LV E/e' medial:  31.4 LV IVS:        1.30 cm      LV e' lateral:   2.98 cm/s LVOT diam:     2.50 cm      LV E/e' lateral: 36.9 LV SV:         71 LV SV Index:   29 LVOT  Area:     4.91 cm                              3D Volume EF: LV Volumes (MOD)            3D EF:        26 % LV vol d, MOD A2C: 239.0 ml LV EDV:       346 ml LV vol d, MOD A4C: 230.0 ml LV ESV:       255 ml LV vol s, MOD A2C: 233.0 ml LV SV:        91 ml LV vol s, MOD A4C: 169.0 ml LV SV MOD A2C:     6.0 ml LV SV MOD A4C:     230.0 ml LV SV MOD BP:      34.1 ml RIGHT VENTRICLE            IVC RV S prime:     8.45 cm/s  IVC diam: 2.90 cm TAPSE (M-mode): 1.6 cm LEFT ATRIUM              Index        RIGHT ATRIUM           Index LA diam:        6.00 cm  2.42 cm/m   RA Area:     26.30 cm LA Vol (A2C):   138.0 ml 55.56 ml/m  RA Volume:   89.70 ml  36.11 ml/m LA Vol (A4C):   112.0 ml 45.09 ml/m LA Biplane Vol: 131.0 ml 52.74 ml/m  AORTIC VALVE LVOT Vmax:   80.00 cm/s LVOT Vmean:  49.700 cm/s LVOT VTI:    0.145 m  AORTA Ao Root diam: 5.00 cm Ao Asc diam:  4.45 cm MITRAL VALVE                TRICUSPID VALVE MV Area (PHT): 4.49 cm     TR Peak grad:   26.0 mmHg MV Decel Time: 169 msec     TR Vmax:        255.00 cm/s MV E velocity: 110.00 cm/s                             SHUNTS                             Systemic VTI:  0.14 m                             Systemic Diam: 2.50 cm Fransico Him MD Electronically signed by Fransico Him MD Signature Date/Time: 07/23/2022/3:30:19 PM    Final    CT Angio Chest PE W and/or Wo Contrast  Result Date: 07/22/2022 CLINICAL DATA:  Chest pain, bilateral lower extremity swelling, shortness of breath EXAM: CT ANGIOGRAPHY CHEST WITH CONTRAST TECHNIQUE: Multidetector CT imaging of the chest was performed using the standard protocol during bolus administration of intravenous contrast. Multiplanar CT image reconstructions and MIPs were obtained to evaluate the vascular anatomy. RADIATION DOSE REDUCTION: This exam was performed according to the departmental dose-optimization program which includes automated exposure control, adjustment of the mA and/or kV according to patient size and/or use  of iterative reconstruction technique. CONTRAST:  56m OMNIPAQUE IOHEXOL 350 MG/ML SOLN COMPARISON:  Chest x-ray July 22, 2022 FINDINGS: Cardiovascular: Satisfactory opacification of the pulmonary arteries to the segmental level. No evidence of pulmonary embolism. Normal heart size. No pericardial effusion. Mediastinum/Nodes: No enlarged mediastinal, hilar, or axillary lymph nodes. Thyroid gland, trachea, and esophagus demonstrate no significant findings. Lungs/Pleura: Patent central airways. Patchy mixed consolidative and ground-glass opacities within the dependent aspects of the left lower lobe and inferior aspect of the left upper lobe. Focal left pleural thickening along the superior/posterior aspect of the left lower lobe (series 5, image 125). No suspicious pulmonary nodules or masses. No pneumothorax. Upper Abdomen: No acute abnormality. Musculoskeletal: Mild bilateral gynecomastia. No acute or significant osseous findings. Review of the MIP images confirms the above findings. IMPRESSION: 1. No acute pulmonary embolism. 2. Patchy mixed consolidative and ground-glass opacities within the dependent left upper and lower lobes, concerning for aspiration and/or multifocal infection. Electronically Signed   By: Beryle Flock M.D.   On: 07/22/2022 17:29   DG Chest 2 View  Result Date: 07/22/2022 CLINICAL DATA:  Acute chest pain shortness of breath and cough. EXAM: CHEST - 2 VIEW COMPARISON:  08/21/2021 chest radiograph and prior studies FINDINGS: UPPER limits normal heart size and LEFT ICD again noted. Patchy mid and LOWER LEFT lung opacities are suspicious for infection/pneumonia. Mild chronic peribronchial thickening again noted. There is no evidence of pneumothorax or pleural effusion. IMPRESSION: Patchy mid and LOWER LEFT lung opacities suspicious for infection/pneumonia. Electronically Signed   By: Margarette Canada M.D.   On: 07/22/2022 15:12     Discharge Exam: Vitals:   07/23/22 0614 07/23/22 1527   BP: 118/79 109/79  Pulse: (!) 55 (!) 58  Resp: 20 17  Temp: 98.1 F (36.7 C) (!) 97.4 F (36.3 C)  SpO2: 97% 100%   Vitals:   07/22/22 2224 07/23/22 0614 07/23/22 0700 07/23/22 1527  BP:  118/79  109/79  Pulse: 67 (!) 55  (!) 58  Resp:  20  17  Temp:  98.1 F (36.7 C)  (!) 97.4 F (36.3 C)  TempSrc:  Oral  Oral  SpO2:  97%  100%  Weight:   113.6 kg   Height:        General: Pt is alert, awake, not in acute distress Cardiovascular: RRR, S1/S2 +, no rubs, no gallops Respiratory: CTA bilaterally, no wheezing, no rhonchi Abdominal: Soft, NT, ND, bowel sounds + Extremities: no edema, no cyanosis    The results of significant diagnostics from this hospitalization (including imaging, microbiology, ancillary and laboratory) are listed below for reference.     Microbiology: Recent Results (from the past 240 hour(s))  Resp Panel by RT-PCR (Flu A&B, Covid) Anterior Nasal Swab     Status: None   Collection Time: 07/22/22  3:19 PM   Specimen: Anterior Nasal Swab  Result Value Ref Range Status   SARS Coronavirus 2 by RT PCR NEGATIVE NEGATIVE Final    Comment: (NOTE) SARS-CoV-2 target nucleic acids are NOT DETECTED.  The SARS-CoV-2 RNA is generally detectable in upper respiratory specimens during the acute phase of infection. The lowest concentration of SARS-CoV-2 viral copies this assay can detect is 138 copies/mL. A negative result does not preclude SARS-Cov-2 infection and should not be used as the sole basis for treatment or other patient management decisions. A negative result may occur with  improper specimen collection/handling, submission of specimen other than nasopharyngeal swab, presence of viral mutation(s) within the areas targeted by this assay, and inadequate number of viral copies(<138 copies/mL). A negative result must be combined with clinical  observations, patient history, and epidemiological information. The expected result is Negative.  Fact Sheet for  Patients:  EntrepreneurPulse.com.au  Fact Sheet for Healthcare Providers:  IncredibleEmployment.be  This test is no t yet approved or cleared by the [place] FDA and  has been authorized for detection and/or diagnosis of SARS-CoV-2 by FDA under an Emergency Use Authorization (EUA). This EUA will remain  in effect (meaning this test can be used) for the duration of the COVID-19 declaration under Section 564(b)(1) of the Act, 21 U.S.C.section 360bbb-3(b)(1), unless the authorization is terminated  or revoked sooner.       Influenza A by PCR NEGATIVE NEGATIVE Final   Influenza B by PCR NEGATIVE NEGATIVE Final    Comment: (NOTE) The Xpert Xpress SARS-CoV-2/FLU/RSV plus assay is intended as an aid in the diagnosis of influenza from Nasopharyngeal swab specimens and should not be used as a sole basis for treatment. Nasal washings and aspirates are unacceptable for Xpert Xpress SARS-CoV-2/FLU/RSV testing.  Fact Sheet for Patients: EntrepreneurPulse.com.au  Fact Sheet for Healthcare Providers: IncredibleEmployment.be  This test is not yet approved or cleared by the [place] FDA and has been authorized for detection and/or diagnosis of SARS-CoV-2 by FDA under an Emergency Use Authorization (EUA). This EUA will remain in effect (meaning this test can be used) for the duration of the COVID-19 declaration under Section 564(b)(1) of the Act, 21 U.S.C. section 360bbb-3(b)(1), unless the authorization is terminated or revoked.  Performed at [facility], [address], [place],  [address]      Labs: BNP (last 3 results) Recent Labs    07/22/22 1517  BNP 6,294.7*   Basic Metabolic Panel: Recent Labs  Lab 07/22/22 1459 07/22/22 2212 07/23/22 0529  NA 138  --  142  K 3.7  --  3.5  CL 104  --  104  CO2 23  --  28  GLUCOSE 139*  --  111*  BUN 31*  --  25*  CREATININE 1.64* 1.62* 1.46*   CALCIUM 8.6*  --  8.3*   Liver Function Tests: Recent Labs  Lab 07/22/22 1457  AST 24  ALT 50*  ALKPHOS 70  BILITOT 1.7*  PROT 6.4*  ALBUMIN 3.3*   Recent Labs  Lab 07/22/22 1457  LIPASE 26   No results for input(s): "AMMONIA" in the last 168 hours. CBC: Recent Labs  Lab 07/22/22 1459 07/22/22 2212  WBC 11.7* 12.1*  HGB 16.5 17.5*  HCT 48.5 50.9  MCV 95.7 94.3  PLT 167 179   Cardiac Enzymes: No results for input(s): "CKTOTAL", "CKMB", "CKMBINDEX", "TROPONINI" in the last 168 hours. BNP: Invalid input(s): "POCBNP" CBG: Recent Labs  Lab 07/22/22 2222 07/23/22 0808 07/23/22 1134 07/23/22 1616  GLUCAP 115* 117* 193* 143*   D-Dimer Recent Labs    07/22/22 1518  DDIMER 1.16*   Hgb A1c Recent Labs    07/22/22 2213  HGBA1C 6.3*   Lipid Profile No results for input(s): "CHOL", "HDL", "LDLCALC", "TRIG", "CHOLHDL", "LDLDIRECT" in the last 72 hours. Thyroid function studies No results for input(s): "TSH", "T4TOTAL", "T3FREE", "THYROIDAB" in the last 72 hours.  Invalid input(s): "FREET3" Anemia work up No results for input(s): "VITAMINB12", "FOLATE", "FERRITIN", "TIBC", "IRON", "RETICCTPCT" in the last 72 hours. Urinalysis No results found for: "COLORURINE", "APPEARANCEUR", "LABSPEC", "PHURINE", "GLUCOSEU", "HGBUR", "BILIRUBINUR", "KETONESUR", "PROTEINUR", "UROBILINOGEN", "NITRITE", "LEUKOCYTESUR" Sepsis Labs Recent Labs  Lab 07/22/22 1459 07/22/22 2212  WBC 11.7* 12.1*   Microbiology Recent Results (from the past 240 hour(s))  Resp Panel  by RT-PCR (Flu A&B, Covid) Anterior Nasal Swab     Status: None   Collection Time: 07/22/22  3:19 PM   Specimen: Anterior Nasal Swab  Result Value Ref Range Status   SARS Coronavirus 2 by RT PCR NEGATIVE NEGATIVE Final    Comment: (NOTE) SARS-CoV-2 target nucleic acids are NOT DETECTED.  The SARS-CoV-2 RNA is generally detectable in upper respiratory specimens during the acute phase of infection. The  lowest concentration of SARS-CoV-2 viral copies this assay can detect is 138 copies/mL. A negative result does not preclude SARS-Cov-2 infection and should not be used as the sole basis for treatment or other patient management decisions. A negative result may occur with  improper specimen collection/handling, submission of specimen other than nasopharyngeal swab, presence of viral mutation(s) within the areas targeted by this assay, and inadequate number of viral copies(<138 copies/mL). A negative result must be combined with clinical observations, patient history, and epidemiological information. The expected result is Negative.  Fact Sheet for Patients:  EntrepreneurPulse.com.au  Fact Sheet for Healthcare Providers:  IncredibleEmployment.be  This test is no t yet approved or cleared by the [place] FDA and  has been authorized for detection and/or diagnosis of SARS-CoV-2 by FDA under an Emergency Use Authorization (EUA). This EUA will remain  in effect (meaning this test can be used) for the duration of the COVID-19 declaration under Section 564(b)(1) of the Act, 21 U.S.C.section 360bbb-3(b)(1), unless the authorization is terminated  or revoked sooner.       Influenza A by PCR NEGATIVE NEGATIVE Final   Influenza B by PCR NEGATIVE NEGATIVE Final    Comment: (NOTE) The Xpert Xpress SARS-CoV-2/FLU/RSV plus assay is intended as an aid in the diagnosis of influenza from Nasopharyngeal swab specimens and should not be used as a sole basis for treatment. Nasal washings and aspirates are unacceptable for Xpert Xpress SARS-CoV-2/FLU/RSV testing.  Fact Sheet for Patients: EntrepreneurPulse.com.au  Fact Sheet for Healthcare Providers: IncredibleEmployment.be  This test is not yet approved or cleared by the [place] FDA and has been authorized for detection and/or diagnosis of SARS-CoV-2 by FDA under  an Emergency Use Authorization (EUA). This EUA will remain in effect (meaning this test can be used) for the duration of the COVID-19 declaration under Section 564(b)(1) of the Act, 21 U.S.C. section 360bbb-3(b)(1), unless the authorization is terminated or revoked.  Performed at [facility], [address], [place], [place] [address]      Time coordinating discharge: 35 minutes  SIGNED:   Rodena Goldmann, DO [facility] 07/23/2022, 5:06 PM  If 7PM-7AM, please contact night-coverage [email or web address]

## 2022-07-24 DIAGNOSIS — J189 Pneumonia, unspecified organism: Secondary | ICD-10-CM | POA: Diagnosis not present

## 2022-07-24 DIAGNOSIS — I5022 Chronic systolic (congestive) heart failure: Secondary | ICD-10-CM | POA: Diagnosis not present

## 2022-07-24 DIAGNOSIS — I10 Essential (primary) hypertension: Secondary | ICD-10-CM | POA: Diagnosis not present

## 2022-07-24 DIAGNOSIS — E119 Type 2 diabetes mellitus without complications: Secondary | ICD-10-CM | POA: Diagnosis not present

## 2022-07-26 ENCOUNTER — Other Ambulatory Visit (HOSPITAL_COMMUNITY): Payer: Self-pay

## 2022-07-27 ENCOUNTER — Telehealth (HOSPITAL_COMMUNITY): Payer: Self-pay

## 2022-07-27 ENCOUNTER — Ambulatory Visit (HOSPITAL_COMMUNITY): Payer: BC Managed Care – PPO

## 2022-07-27 NOTE — Telephone Encounter (Signed)
Heart Failure Nurse Navigator Progress Note  Spoke with patient after cancelled HV TOC appt this AM d/t pt going to wrong office ([place]).  Rescheduled for 10/23 @ 10AM per pt request. Gave directions. Reviewed SDOH needs, will work towards smoking cessation. Endorses medication compliance.   Confirmed appt with patient prior to ending call.     Pricilla Holm, MSN, RN Heart Failure Nurse Navigator

## 2022-07-27 NOTE — Telephone Encounter (Signed)
Call attempted to confirm HV TOC appt today at [place] appropriate VM left with callback number.   Pricilla Holm, MSN, RN Heart Failure Nurse Navigator

## 2022-07-30 ENCOUNTER — Ambulatory Visit: Payer: BC Managed Care – PPO | Admitting: Internal Medicine

## 2022-07-30 ENCOUNTER — Encounter: Payer: Self-pay | Admitting: Internal Medicine

## 2022-07-30 VITALS — BP 104/71 | HR 80 | Ht 78.0 in | Wt 253.2 lb

## 2022-07-30 DIAGNOSIS — Z1159 Encounter for screening for other viral diseases: Secondary | ICD-10-CM | POA: Diagnosis not present

## 2022-07-30 DIAGNOSIS — E119 Type 2 diabetes mellitus without complications: Secondary | ICD-10-CM | POA: Diagnosis not present

## 2022-07-30 DIAGNOSIS — E785 Hyperlipidemia, unspecified: Secondary | ICD-10-CM

## 2022-07-30 DIAGNOSIS — J4520 Mild intermittent asthma, uncomplicated: Secondary | ICD-10-CM

## 2022-07-30 DIAGNOSIS — G4733 Obstructive sleep apnea (adult) (pediatric): Secondary | ICD-10-CM

## 2022-07-30 DIAGNOSIS — Z720 Tobacco use: Secondary | ICD-10-CM

## 2022-07-30 DIAGNOSIS — I10 Essential (primary) hypertension: Secondary | ICD-10-CM

## 2022-07-30 DIAGNOSIS — J45909 Unspecified asthma, uncomplicated: Secondary | ICD-10-CM | POA: Insufficient documentation

## 2022-07-30 DIAGNOSIS — Z7689 Persons encountering health services in other specified circumstances: Secondary | ICD-10-CM

## 2022-07-30 DIAGNOSIS — N182 Chronic kidney disease, stage 2 (mild): Secondary | ICD-10-CM

## 2022-07-30 DIAGNOSIS — I428 Other cardiomyopathies: Secondary | ICD-10-CM

## 2022-07-30 NOTE — Assessment & Plan Note (Signed)
Previously documented history of CKD stage II.  Repeat CMP and urine microalbumin/creatinine ratio ordered today.

## 2022-07-30 NOTE — Assessment & Plan Note (Signed)
Presenting today to establish care.  Recent medical records and labs reviewed.  Labs from Dr. Josephine Cables office have been requested. -Repeat labs ordered today -Urine microalbumin/creatinine ratio ordered -Referral placed to ophthalmology -Outstanding vaccines declined for now

## 2022-07-30 NOTE — Assessment & Plan Note (Signed)
Currently smoking 2-3 cigarettes daily.  He previously smoked 1 pack/day.  Has been smoking since age 61. -The patient was counseled on the dangers of tobacco use, and was advised to quit.  Reviewed strategies to maximize success, including removing cigarettes and smoking materials from environment, stress management, substitution of other forms of reinforcement, support of family/friends and written materials.

## 2022-07-30 NOTE — Patient Instructions (Signed)
It was a pleasure to see you today.  Thank you for giving [name] the opportunity to be involved in your care.  Below is a brief recap of your visit and next steps.  We will plan to see you again in 3 months  Summary You have established care today We will repeat a few labs from your recent hospital admission I have placed a referral to ophthalmology to establish care for diabetic eye exams I have ordered a CPAP titration study due to your history of sleep apnea  Next steps Follow up in 3 months I will notify you of lab results

## 2022-07-30 NOTE — Assessment & Plan Note (Signed)
Moderate OSA noted on PSG from May 2017.  Pap titration study recommended at that time.  Patient states he has never undergone a titration study and does not currently use CPAP. -PAP titration ordered today

## 2022-07-30 NOTE — Assessment & Plan Note (Signed)
A1c 6.31-week ago.  He is currently prescribed metformin 500 mg twice daily and Farxiga 10 mg daily.  -No changes today. -Urine microalbumin/creatinine ratio ordered -He will see his podiatrist tomorrow for diabetic foot exam -Referral placed to ophthalmology for diabetic eye care

## 2022-07-30 NOTE — Assessment & Plan Note (Signed)
BP 104/71 today.  Well-controlled on current medication regimen.  No changes today.

## 2022-07-30 NOTE — Progress Notes (Signed)
New Patient Office Visit  Subjective    Patient ID: Zalmen Wrightsman, male    DOB: 12/17/1960  Age: 61 y.o. MRN: [id number]  CC:  Chief Complaint  Patient presents with   Establish Care   HPI Burk Hoctor presents to establish care.  He is a 61 year old male with a past medical history significant for HFrEF, T2DM, HTN, asthma, vertigo, OSA, and current tobacco use.  He was previously followed by Dr. Legrand Rams.  Mr. Golden has no acute concerns today and states that he is asymptomatic.  He was recently admitted to [facility] 10/8 - 10/9 after presenting with shortness of breath and was diagnosed with community-acquired pneumonia and treated for HF exacerbation. Chronic medical conditions and outstanding preventative healthcare maintenance items discussed today are individually addressed in A/P below.  Outpatient Encounter Medications as of 07/30/2022  Medication Sig   acetaminophen (TYLENOL) 500 MG tablet Take 1,000 mg by mouth every 6 (six) hours as needed for moderate pain.   albuterol (VENTOLIN HFA) 108 (90 Base) MCG/ACT inhaler Inhale 1-2 puffs into the lungs every 6 (six) hours as needed for wheezing or shortness of breath.   aspirin 81 MG chewable tablet Chew 81 mg by mouth in the morning.   carvedilol (COREG) 12.5 MG tablet TAKE 1 TABLET(12.5 MG) BY MOUTH TWICE DAILY (Patient taking differently: Take 12.5 mg by mouth 2 (two) times daily with a meal.)   dapagliflozin propanediol (FARXIGA) 10 MG TABS tablet Take 1 tablet (10 mg total) by mouth daily before breakfast.   ENTRESTO 49-51 MG TAKE 1 TABLET BY MOUTH TWICE DAILY   furosemide (LASIX) 40 MG tablet Take 1 tablet (40 mg total) by mouth every other day. (Patient taking differently: Take 40 mg by mouth daily.)   isosorbide mononitrate (IMDUR) 60 MG 24 hr tablet TAKE 1 TABLET(60 MG) BY MOUTH DAILY (Patient taking differently: Take 60 mg by mouth daily.)   Meclizine HCl 25 MG CHEW Chew 1 tablet by mouth 3 (three) times daily  as needed.   metFORMIN (GLUCOPHAGE) 500 MG tablet Take 500 mg by mouth 2 (two) times daily with a meal.    spironolactone (ALDACTONE) 25 MG tablet TAKE 1 TABLET(25 MG) BY MOUTH DAILY (Patient taking differently: Take 25 mg by mouth once.)   No facility-administered encounter medications on file as of 07/30/2022.    Past Medical History:  Diagnosis Date   CKD (chronic kidney disease) stage 2, GFR 60-89 ml/min    Diabetes mellitus type II, non insulin dependent (HCC)    Dilated aortic root (HCC)    Hypercholesteremia    Hypertension    NICM (nonischemic cardiomyopathy) (HCC)    NSVT (nonsustained ventricular tachycardia) (HCC)    Obstructive sleep apnea    was positive but never given a CPAP   Pulmonary hypertension ([place])    PVC's (premature ventricular contractions)    Tobacco abuse     Past Surgical History:  Procedure Laterality Date   CARDIAC CATHETERIZATION  2002   COLONOSCOPY N/A 03/22/2014   Procedure: COLONOSCOPY;  Surgeon: Danie Binder, MD;  Location: [facility];  Service: Endoscopy;  Laterality: N/A;  11:15 AM   ICD IMPLANT N/A 08/21/2021   Procedure: ICD IMPLANT;  Surgeon: Evans Lance, MD;  Location: [place] [facility];  Service: Cardiovascular;  Laterality: N/A;   INGUINAL HERNIA REPAIR Right 06/24/2017   Procedure: HERNIA REPAIR INGUINAL ADULT WITH MESH;  Surgeon: Aviva Signs, MD;  Location: [facility] ORS;  Service: General;  Laterality: Right;   RIGHT/LEFT HEART CATH AND CORONARY ANGIOGRAPHY N/A 07/19/2021   Procedure: RIGHT/LEFT HEART CATH AND CORONARY ANGIOGRAPHY;  Surgeon: Burnell Blanks, MD;  Location: [place] [facility];  Service: Cardiovascular;  Laterality: N/A;    Family History  Problem Relation Age of Onset   Heart attack Father    Hypertension Father    Iron deficiency Father    Coronary artery disease Mother    Heart disease Maternal Grandfather        Also paternal grandfather   Cardiomyopathy Brother        No significant coronary disease  by coronary angiography   Thyroid disease Sister    Sudden death Other    Colon cancer Neg Hx     Social History   Socioeconomic History   Marital status: Married    Spouse name: Dontre Laduca   Number of children: 4   Years of education: Not on file   Highest education level: Associate degree: occupational, Hotel manager, or vocational program  Occupational History   Occupation: Librarian, academic    Comment: [employer]  Tobacco Use   Smoking status: Former    Packs/day: 0.50    Years: 32.00    Total pack years: 16.00    Types: Cigarettes    Start date: 10/15/1982    Quit date: 07/18/2021    Years since quitting: 1.0   Smokeless tobacco: Never   Tobacco comments:    3 cigaretts a day  Vaping Use   Vaping Use: Never used  Substance and Sexual Activity   Alcohol use: No    Alcohol/week: 0.0 standard drinks of alcohol   Drug use: No   Sexual activity: Yes    Birth control/protection: None  Other Topics Concern   Not on file  Social History Narrative   Divorced   No regular exercise   Social Determinants of Health   Financial Resource Strain: Low Risk  (07/21/2021)   Overall Financial Resource Strain (CARDIA)    Difficulty of Paying Living Expenses: Not very hard  Food Insecurity: No Food Insecurity (07/22/2022)   Hunger Vital Sign    Worried About Running Out of Food in the Last Year: Never true    Ran Out of Food in the Last Year: Never true  Transportation Needs: No Transportation Needs (07/22/2022)   PRAPARE - Hydrologist (Medical): No    Lack of Transportation (Non-Medical): No  Physical Activity: Not on file  Stress: Not on file  Social Connections: Not on file  Intimate Partner Violence: Not At Risk (07/22/2022)   Humiliation, Afraid, Rape, and Kick questionnaire    Fear of Current or Ex-Partner: No    Emotionally Abused: No    Physically Abused: No    Sexually Abused: No    Review of Systems  Constitutional:  Negative for chills  and fever.  HENT:  Negative for sore throat.   Respiratory:  Negative for cough and shortness of breath.   Cardiovascular:  Negative for chest pain, palpitations and leg swelling.  Gastrointestinal:  Negative for abdominal pain, blood in stool, constipation, diarrhea, nausea and vomiting.  Genitourinary:  Negative for dysuria and hematuria.  Musculoskeletal:  Negative for myalgias.  Skin:  Negative for itching and rash.  Neurological:  Negative for dizziness and headaches.  Psychiatric/Behavioral:  Negative for depression and suicidal ideas.         Objective    BP 104/71   Pulse 80   Ht  $'6\' 6"'m$  (1.981 m)   Wt 253 lb 3.2 oz (114.9 kg)   SpO2 98%   BMI 29.26 kg/m   Physical Exam Vitals reviewed.  Constitutional:      General: He is not in acute distress.    Appearance: Normal appearance. He is not ill-appearing.  HENT:     Head: Normocephalic and atraumatic.     Nose: Nose normal. No congestion or rhinorrhea.     Mouth/Throat:     Mouth: Mucous membranes are moist.     Pharynx: Oropharynx is clear.  Eyes:     Extraocular Movements: Extraocular movements intact.     Conjunctiva/sclera: Conjunctivae normal.     Pupils: Pupils are equal, round, and reactive to light.  Cardiovascular:     Rate and Rhythm: Normal rate and regular rhythm.     Pulses: Normal pulses.     Heart sounds: Normal heart sounds. No murmur heard. Pulmonary:     Effort: Pulmonary effort is normal.     Breath sounds: Normal breath sounds. No wheezing, rhonchi or rales.  Abdominal:     General: Abdomen is flat. Bowel sounds are normal. There is no distension.     Palpations: Abdomen is soft.     Tenderness: There is no abdominal tenderness.  Musculoskeletal:        General: No swelling or deformity. Normal range of motion.     Cervical back: Normal range of motion.     Right lower leg: No edema.     Left lower leg: No edema.  Skin:    General: Skin is warm and dry.     Capillary Refill: Capillary  refill takes less than 2 seconds.  Neurological:     General: No focal deficit present.     Mental Status: He is alert and oriented to person, place, and time.     Motor: No weakness.  Psychiatric:        Mood and Affect: Mood normal.        Behavior: Behavior normal.        Thought Content: Thought content normal.    Last CBC Lab Results  Component Value Date   WBC 12.1 (H) 07/22/2022   HGB 17.5 (H) 07/22/2022   HCT 50.9 07/22/2022   MCV 94.3 07/22/2022   MCH 32.4 07/22/2022   RDW 14.6 07/22/2022   PLT 179 32/67/1245   Last metabolic panel Lab Results  Component Value Date   GLUCOSE 111 (H) 07/23/2022   NA 142 07/23/2022   K 3.5 07/23/2022   CL 104 07/23/2022   CO2 28 07/23/2022   BUN 25 (H) 07/23/2022   CREATININE 1.46 (H) 07/23/2022   GFRNONAA 54 (L) 07/23/2022   CALCIUM 8.3 (L) 07/23/2022   PROT 6.4 (L) 07/22/2022   ALBUMIN 3.3 (L) 07/22/2022   BILITOT 1.7 (H) 07/22/2022   ALKPHOS 70 07/22/2022   AST 24 07/22/2022   ALT 50 (H) 07/22/2022   ANIONGAP 10 07/23/2022   Last lipids Lab Results  Component Value Date   CHOL 190 10/31/2012   HDL 38 (L) 10/31/2012   LDLCALC 123 (H) 10/31/2012   TRIG 143 10/31/2012   CHOLHDL 5.0 10/31/2012   Last hemoglobin A1c Lab Results  Component Value Date   HGBA1C 6.3 (H) 07/22/2022   Last thyroid functions Lab Results  Component Value Date   TSH 1.340 07/21/2021    Assessment & Plan:   Problem List Items Addressed This Visit       Essential hypertension  BP 104/71 today.  Well-controlled on current medication regimen.  No changes today.      Cardiomyopathy, nonischemic (HCC)    EF 20-25% on TTE from 10/9.  Severe LA dilatation noted.  He is euvolemic on exam today.  Closely followed by cardiology and prescribed appropriate GDMT.  No changes today.  We clarified that he is supposed to take Lasix 40 mg every other day and should take an extra dose and contact cardiology for 3 lb or more weight gain.       Obstructive sleep apnea    Moderate OSA noted on PSG from May 2017.  Pap titration study recommended at that time.  Patient states he has never undergone a titration study and does not currently use CPAP. -PAP titration ordered today      Asthma    Asymptomatic currently.  Unremarkable pulmonary exam.  He has an albuterol inhaler for as needed use but has not needed to use his inhaler recently.      Diabetes mellitus type 2 in nonobese (HCC) - Primary    A1c 6.31-week ago.  He is currently prescribed metformin 500 mg twice daily and Farxiga 10 mg daily.  -No changes today. -Urine microalbumin/creatinine ratio ordered -He will see his podiatrist tomorrow for diabetic foot exam -Referral placed to ophthalmology for diabetic eye care      Stage 2 chronic kidney disease    Previously documented history of CKD stage II.  Repeat CMP and urine microalbumin/creatinine ratio ordered today.      Hyperlipidemia    Previously documented history of hyperlipidemia.  Last lipid panel in our records is from 2014.  Records from Dr. Legrand Rams are pending.  He is not currently on any cholesterol-lowering therapy. -Repeat lipid panel -Likely needs high intensity statin therapy      Tobacco abuse    Currently smoking 2-3 cigarettes daily.  He previously smoked 1 pack/day.  Has been smoking since age 15. -The patient was counseled on the dangers of tobacco use, and was advised to quit.  Reviewed strategies to maximize success, including removing cigarettes and smoking materials from environment, stress management, substitution of other forms of reinforcement, support of family/friends and written materials.      Encounter to establish care    Presenting today to establish care.  Recent medical records and labs reviewed.  Labs from Dr. Josephine Cables office have been requested. -Repeat labs ordered today -Urine microalbumin/creatinine ratio ordered -Referral placed to ophthalmology -Outstanding vaccines declined  for now      Return in about 3 months (around 10/30/2022).   Johnette Abraham, MD

## 2022-07-30 NOTE — Assessment & Plan Note (Signed)
Asymptomatic currently.  Unremarkable pulmonary exam.  He has an albuterol inhaler for as needed use but has not needed to use his inhaler recently.

## 2022-07-30 NOTE — Assessment & Plan Note (Signed)
EF 20-25% on TTE from 10/9.  Severe LA dilatation noted.  He is euvolemic on exam today.  Closely followed by cardiology and prescribed appropriate GDMT.  No changes today.  We clarified that he is supposed to take Lasix 40 mg every other day and should take an extra dose and contact cardiology for 3 lb or more weight gain.

## 2022-07-30 NOTE — Assessment & Plan Note (Signed)
Previously documented history of hyperlipidemia.  Last lipid panel in our records is from 2014.  Records from Dr. Legrand Rams are pending.  He is not currently on any cholesterol-lowering therapy. -Repeat lipid panel -Likely needs high intensity statin therapy

## 2022-07-31 ENCOUNTER — Telehealth: Payer: Self-pay

## 2022-07-31 NOTE — Telephone Encounter (Signed)
Per Bethena Roys at [facility] please do not fax no more faxes already received 3 times.

## 2022-08-01 ENCOUNTER — Other Ambulatory Visit: Payer: Self-pay

## 2022-08-01 DIAGNOSIS — E785 Hyperlipidemia, unspecified: Secondary | ICD-10-CM

## 2022-08-01 LAB — CMP14+EGFR
ALT: 28 IU/L (ref 0–44)
AST: 21 IU/L (ref 0–40)
Albumin/Globulin Ratio: 1.6 (ref 1.2–2.2)
Albumin: 4.1 g/dL (ref 3.9–4.9)
Alkaline Phosphatase: 78 IU/L (ref 44–121)
BUN/Creatinine Ratio: 16 (ref 10–24)
BUN: 23 mg/dL (ref 8–27)
Bilirubin Total: 0.8 mg/dL (ref 0.0–1.2)
CO2: 21 mmol/L (ref 20–29)
Calcium: 8.8 mg/dL (ref 8.6–10.2)
Chloride: 104 mmol/L (ref 96–106)
Creatinine, Ser: 1.48 mg/dL — ABNORMAL HIGH (ref 0.76–1.27)
Globulin, Total: 2.6 g/dL (ref 1.5–4.5)
Glucose: 100 mg/dL — ABNORMAL HIGH (ref 70–99)
Potassium: 5 mmol/L (ref 3.5–5.2)
Sodium: 138 mmol/L (ref 134–144)
Total Protein: 6.7 g/dL (ref 6.0–8.5)
eGFR: 53 mL/min/{1.73_m2} — ABNORMAL LOW (ref >59)

## 2022-08-01 LAB — CBC WITH DIFFERENTIAL/PLATELET
Basophils Absolute: 0 10*3/uL (ref 0.0–0.2)
Basos: 1 %
EOS (ABSOLUTE): 0.1 10*3/uL (ref 0.0–0.4)
Eos: 1 %
Hematocrit: 54.2 % — ABNORMAL HIGH (ref 37.5–51.0)
Hemoglobin: 18.5 g/dL — ABNORMAL HIGH (ref 13.0–17.7)
Immature Grans (Abs): 0 10*3/uL (ref 0.0–0.1)
Immature Granulocytes: 0 %
Lymphocytes Absolute: 1.7 10*3/uL (ref 0.7–3.1)
Lymphs: 21 %
MCH: 32.2 pg (ref 26.6–33.0)
MCHC: 34.1 g/dL (ref 31.5–35.7)
MCV: 94 fL (ref 79–97)
Monocytes Absolute: 0.8 10*3/uL (ref 0.1–0.9)
Monocytes: 10 %
Neutrophils Absolute: 5.3 10*3/uL (ref 1.4–7.0)
Neutrophils: 67 %
Platelets: 166 10*3/uL (ref 150–450)
RBC: 5.75 x10E6/uL (ref 4.14–5.80)
RDW: 14.5 % (ref 11.6–15.4)
WBC: 7.9 10*3/uL (ref 3.4–10.8)

## 2022-08-01 LAB — HCV INTERPRETATION

## 2022-08-01 LAB — MICROALBUMIN / CREATININE URINE RATIO
Creatinine, Urine: 28.7 mg/dL
Microalb/Creat Ratio: 201 mg/g creat — ABNORMAL HIGH (ref 0–29)
Microalbumin, Urine: 57.7 ug/mL

## 2022-08-01 LAB — HCV AB W REFLEX TO QUANT PCR: HCV Ab: NONREACTIVE

## 2022-08-05 NOTE — Progress Notes (Incomplete)
HEART & VASCULAR TRANSITION OF CARE CONSULT NOTE     Referring Physician: Primary Care: Primary Cardiologist:  HPI: Referred to clinic by *** for heart failure consultation. 61 y/o AA male w/ chronic systolic heart failure 2/2 NICM, HTN, T2DM, family h/o CHF (mother and father), tobacco use and untreated OSA. HF dates back to 2011. Echo then showed mildly reduced LVEF 45-50%. In 2013, EF dropped to 20-25%. Subsequent LHC showed no CAD. H  He had been followed by Dr. Lattie Haw but lost to f/u for several years. Referred back to cardiology and established care w/ Dr. Johnsie Cancel. Echo 07/22 LVEF 25-30% w GIII DDD (restrictive), RV mildly reduced, mod BAE, mod MR. + Aortic root dilation, measuring 46 mm. He was set up for outpatient R/LHC.    Cath on 07/19/21 showed no CAD, but elevated right and left sided filling pressures (LV: 130/22/39, RA 15, RV 82/13/20, PA 83/30 mean 49, PCWP 35, CI marginal at 2.15). He was admitted for IV diuresis. He was diuresed w/ IV lasix and placed on GDMT. Hospitalization was also notable for frequent PVCs. ? blocker was titrated.     He was seen in [facility] clinic 10/22 and referred to AHF clinic. Subsequently no showed his appointment.  cMRI had been ordered but was not completed.  He underwent dual chamber Biotronik ICD by Dr. Lovena Le 11/22.   Patient admitted overnight 07/22/22 with a/c CHF. Had been seen in urgent care 3 weeks prior for cough and congestion and was prescribed an inhaler which provided no improvement. BNP > 3,000. CTA chest with no evidence of PE, patchy mixed consolidative and ground glass opacities concerning for multifocal PNA. He was diuresed with IV lasix and treated with abx for possible PNA.  Echo during admit: EF 20-25%, RV moderately reduced, severe LAE, mild MR, aortic root measures 50 mm/ascending aorta 43 mm, dilated IVC  Here today for hospital follow-up.  Cardiac Testing    Review of Systems: [y] = yes, '[ ]'$  = no   General:  Weight gain '[ ]'$ ; Weight loss '[ ]'$ ; Anorexia '[ ]'$ ; Fatigue '[ ]'$ ; Fever '[ ]'$ ; Chills '[ ]'$ ; Weakness '[ ]'$   Cardiac: Chest pain/pressure '[ ]'$ ; Resting SOB '[ ]'$ ; Exertional SOB '[ ]'$ ; Orthopnea '[ ]'$ ; Pedal Edema '[ ]'$ ; Palpitations '[ ]'$ ; Syncope '[ ]'$ ; Presyncope '[ ]'$ ; Paroxysmal nocturnal dyspnea'[ ]'$   Pulmonary: Cough '[ ]'$ ; Wheezing'[ ]'$ ; Hemoptysis'[ ]'$ ; Sputum '[ ]'$ ; Snoring '[ ]'$   GI: Vomiting'[ ]'$ ; Dysphagia'[ ]'$ ; Melena'[ ]'$ ; Hematochezia '[ ]'$ ; Heartburn'[ ]'$ ; Abdominal pain '[ ]'$ ; Constipation '[ ]'$ ; Diarrhea '[ ]'$ ; BRBPR '[ ]'$   GU: Hematuria'[ ]'$ ; Dysuria '[ ]'$ ; Nocturia'[ ]'$   Vascular: Pain in legs with walking '[ ]'$ ; Pain in feet with lying flat '[ ]'$ ; Non-healing sores '[ ]'$ ; Stroke '[ ]'$ ; TIA '[ ]'$ ; Slurred speech '[ ]'$ ;  Neuro: Headaches'[ ]'$ ; Vertigo'[ ]'$ ; Seizures'[ ]'$ ; Paresthesias'[ ]'$ ;Blurred vision '[ ]'$ ; Diplopia '[ ]'$ ; Vision changes '[ ]'$   Ortho/Skin: Arthritis '[ ]'$ ; Joint pain '[ ]'$ ; Muscle pain '[ ]'$ ; Joint swelling '[ ]'$ ; Back Pain '[ ]'$ ; Rash '[ ]'$   Psych: Depression'[ ]'$ ; Anxiety'[ ]'$   Heme: Bleeding problems '[ ]'$ ; Clotting disorders '[ ]'$ ; Anemia '[ ]'$   Endocrine: Diabetes '[ ]'$ ; Thyroid dysfunction'[ ]'$    Past Medical History:  Diagnosis Date   CKD (chronic kidney disease) stage 2, GFR 60-89 ml/min    Diabetes mellitus type II, non insulin dependent (HCC)    Dilated aortic root (HCC)    Hypercholesteremia  Hypertension    NICM (nonischemic cardiomyopathy) (HCC)    NSVT (nonsustained ventricular tachycardia) (HCC)    Obstructive sleep apnea    was positive but never given a CPAP   Pulmonary hypertension (HCC)    PVC's (premature ventricular contractions)    Tobacco abuse     Current Outpatient Medications  Medication Sig Dispense Refill   acetaminophen (TYLENOL) 500 MG tablet Take 1,000 mg by mouth every 6 (six) hours as needed for moderate pain.     albuterol (VENTOLIN HFA) 108 (90 Base) MCG/ACT inhaler Inhale 1-2 puffs into the lungs every 6 (six) hours as needed for wheezing or shortness of breath. 18 g 0   aspirin 81 MG chewable tablet Chew 81 mg by  mouth in the morning.     carvedilol (COREG) 12.5 MG tablet TAKE 1 TABLET(12.5 MG) BY MOUTH TWICE DAILY (Patient taking differently: Take 12.5 mg by mouth 2 (two) times daily with a meal.) 60 tablet 2   dapagliflozin propanediol (FARXIGA) 10 MG TABS tablet Take 1 tablet (10 mg total) by mouth daily before breakfast. 30 tablet 11   ENTRESTO 49-51 MG TAKE 1 TABLET BY MOUTH TWICE DAILY 60 tablet 6   furosemide (LASIX) 40 MG tablet Take 1 tablet (40 mg total) by mouth every other day. (Patient taking differently: Take 40 mg by mouth daily.) 15 tablet 3   isosorbide mononitrate (IMDUR) 60 MG 24 hr tablet TAKE 1 TABLET(60 MG) BY MOUTH DAILY (Patient taking differently: Take 60 mg by mouth daily.) 30 tablet 10   Meclizine HCl 25 MG CHEW Chew 1 tablet by mouth 3 (three) times daily as needed.     metFORMIN (GLUCOPHAGE) 500 MG tablet Take 500 mg by mouth 2 (two) times daily with a meal.      spironolactone (ALDACTONE) 25 MG tablet TAKE 1 TABLET(25 MG) BY MOUTH DAILY (Patient taking differently: Take 25 mg by mouth once.) 90 tablet 3   No current facility-administered medications for this visit.    No Known Allergies    Social History   Socioeconomic History   Marital status: Married    Spouse name: Jerrik Housholder   Number of children: 4   Years of education: Not on file   Highest education level: Associate degree: occupational, Hotel manager, or vocational program  Occupational History   Occupation: Librarian, academic    Comment: [employer]  Tobacco Use   Smoking status: Former    Packs/day: 0.50    Years: 32.00    Total pack years: 16.00    Types: Cigarettes    Start date: 10/15/1982    Quit date: 07/18/2021    Years since quitting: 1.0   Smokeless tobacco: Never   Tobacco comments:    3 cigaretts a day  Vaping Use   Vaping Use: Never used  Substance and Sexual Activity   Alcohol use: No    Alcohol/week: 0.0 standard drinks of alcohol   Drug use: No   Sexual activity: Yes    Birth  control/protection: None  Other Topics Concern   Not on file  Social History Narrative   Divorced   No regular exercise   Social Determinants of Health   Financial Resource Strain: Low Risk  (07/21/2021)   Overall Financial Resource Strain (CARDIA)    Difficulty of Paying Living Expenses: Not very hard  Food Insecurity: No Food Insecurity (07/22/2022)   Hunger Vital Sign    Worried About Running Out of Food in the Last Year: Never true  Ran Out of Food in the Last Year: Never true  Transportation Needs: No Transportation Needs (07/22/2022)   PRAPARE - Hydrologist (Medical): No    Lack of Transportation (Non-Medical): No  Physical Activity: Not on file  Stress: Not on file  Social Connections: Not on file  Intimate Partner Violence: Not At Risk (07/22/2022)   Humiliation, Afraid, Rape, and Kick questionnaire    Fear of Current or Ex-Partner: No    Emotionally Abused: No    Physically Abused: No    Sexually Abused: No      Family History  Problem Relation Age of Onset   Heart attack Father    Hypertension Father    Iron deficiency Father    Coronary artery disease Mother    Heart disease Maternal Grandfather        Also paternal grandfather   Cardiomyopathy Brother        No significant coronary disease by coronary angiography   Thyroid disease Sister    Sudden death Other    Colon cancer Neg Hx     There were no vitals filed for this visit.  PHYSICAL EXAM: General:  Well appearing. No respiratory difficulty HEENT: normal Neck: supple. no JVD. Carotids 2+ bilat; no bruits. No lymphadenopathy or thryomegaly appreciated. Cor: PMI nondisplaced. Regular rate & rhythm. No rubs, gallops or murmurs. Lungs: clear Abdomen: soft, nontender, nondistended. No hepatosplenomegaly. No bruits or masses. Good bowel sounds. Extremities: no cyanosis, clubbing, rash, edema Neuro: alert & oriented x 3, cranial nerves grossly intact. moves all 4 extremities  w/o difficulty. Affect pleasant.  ECG:   ASSESSMENT & PLAN: 1. Chronic Systolic Heart Failure - NICM. Dates back >10 years. EF 45-50% in 2011, dropped to 25% in 2013 w/ cath showing no CAD - Echo 10/22: EF 25-30% w/ GIIIDD, RV mildly reduced  - LHC 10/22 no CAD . RHC elevated R&L filling pressures and marginal output w/ CI 2.15  - Echo 10/23: EF 25-30%, RV moderately reduced - ? Familial CM vs Infiltrative CM given restrictive pattern on echo. Need cMRI to further assess. ?  How much frequent PVCs contributing.  - S/p dual chamber ICD 12/22 - NYHA ***. Volume appears ***. - *** - Would be a candidate for Advanced Therapies if maintains compliance and continues to refrain from tobacco use. Will refer back to the [facility] again.    2. PVCs - noted during prior hospitalization - on ? blocker - Needs treatment of OSA. Suspect contributing factor    3. Hypertension    4. Pulmonary HTN - RHC 10/5 w/ elevated PAP 83/30, mPAP 49. 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 Type II DM   6. OSA - Sleep study 2013 w/ Mod OSA. Not on CPAP - suspect contributing to HTN, PVCs and HF - needs treatment w/ CPAP.    NYHA *** GDMT  Diuretic- BB- Ace/ARB/ARNI MRA SGLT2i    Referred to HFSW (PCP, Medications, Transportation, ETOH Abuse, Drug Abuse, Insurance, Financial ): Yes or No Refer to Pharmacy: Yes or No Refer to Home Health: Yes on No Refer to Advanced Heart Failure Clinic: Yes or no  Refer to General Cardiology: Yes or No  Follow up

## 2022-08-06 ENCOUNTER — Ambulatory Visit (HOSPITAL_COMMUNITY)
Admission: RE | Admit: 2022-08-06 | Discharge: 2022-08-06 | Disposition: A | Discharge status: Home / Self Care | Payer: BC Managed Care – PPO | Source: Ambulatory Visit | Attending: Physician Assistant | Admitting: Physician Assistant

## 2022-08-06 ENCOUNTER — Encounter (HOSPITAL_COMMUNITY): Payer: Self-pay

## 2022-08-06 ENCOUNTER — Telehealth (HOSPITAL_COMMUNITY): Payer: Self-pay | Admitting: *Deleted

## 2022-08-06 VITALS — BP 140/96 | HR 71 | Wt 253.8 lb

## 2022-08-06 DIAGNOSIS — I493 Ventricular premature depolarization: Secondary | ICD-10-CM | POA: Insufficient documentation

## 2022-08-06 DIAGNOSIS — I428 Other cardiomyopathies: Secondary | ICD-10-CM | POA: Insufficient documentation

## 2022-08-06 DIAGNOSIS — I10 Essential (primary) hypertension: Secondary | ICD-10-CM | POA: Diagnosis not present

## 2022-08-06 DIAGNOSIS — I272 Pulmonary hypertension, unspecified: Secondary | ICD-10-CM | POA: Diagnosis not present

## 2022-08-06 DIAGNOSIS — I7121 Aneurysm of the ascending aorta, without rupture: Secondary | ICD-10-CM | POA: Insufficient documentation

## 2022-08-06 DIAGNOSIS — E1122 Type 2 diabetes mellitus with diabetic chronic kidney disease: Secondary | ICD-10-CM | POA: Diagnosis not present

## 2022-08-06 DIAGNOSIS — Z79899 Other long term (current) drug therapy: Secondary | ICD-10-CM | POA: Diagnosis not present

## 2022-08-06 DIAGNOSIS — G4733 Obstructive sleep apnea (adult) (pediatric): Secondary | ICD-10-CM | POA: Insufficient documentation

## 2022-08-06 DIAGNOSIS — Z8249 Family history of ischemic heart disease and other diseases of the circulatory system: Secondary | ICD-10-CM | POA: Diagnosis not present

## 2022-08-06 DIAGNOSIS — I5022 Chronic systolic (congestive) heart failure: Secondary | ICD-10-CM | POA: Insufficient documentation

## 2022-08-06 DIAGNOSIS — I130 Hypertensive heart and chronic kidney disease with heart failure and stage 1 through stage 4 chronic kidney disease, or unspecified chronic kidney disease: Secondary | ICD-10-CM | POA: Insufficient documentation

## 2022-08-06 NOTE — Telephone Encounter (Signed)
Called to confirm Heart & Vascular Transitions of Care appointment at 10 am on 08/06/22. Patient reminded to bring all medications and pill box organizer with them. Confirmed patient has transportation. Gave directions, instructed to utilize [place] parking.  Confirmed appointment prior to ending call.    Earnestine Leys, BSN, Clinical cytogeneticist Only

## 2022-08-06 NOTE — Patient Instructions (Signed)
Medication Changes:  None. Continue current regimen.   Lab Work:  None. Labs drawn on 07/30/22  Testing/Procedures:  Your physician has requested that you have a cardiac MRI. Cardiac MRI uses a computer to create images of your heart as its beating, producing both still and moving pictures of your heart and major blood vessels. For further information please visit [email or web address] Please follow the instruction sheet given to you today for more information. Radiology department will call you to schedule your appointment once approved by insurance.    Referrals:  You have been referred to the [facility] with Dr. Glade Stanford. Located at [facility] in the [place].   Special Instructions // Education:  Do the following things EVERYDAY: Weigh yourself in the morning before breakfast. Write it down and keep it in a log. Take your medicines as prescribed Eat low salt foods--Limit salt (sodium) to 2000 mg per day.  Stay as active as you can everyday Limit all fluids for the day to less than 2 liters   Follow-Up in: 3-4 weeks with Dr. Daniel Nones    At the [facility], you and your health needs are our priority. We have a designated team specialized in the treatment of Heart Failure. This Care Team includes your primary Heart Failure Specialized Cardiologist (physician), Advanced Practice Providers (APPs- Physician Assistants and Nurse Practitioners), and Pharmacist who all work together to provide you with the care you need, when you need it.   You may see any of the following providers on your designated Care Team at your next follow up:  Dr. Glori Bickers Dr. Loralie Champagne Dr. Roxana Hires, NP Lyda Jester, [place] [facility] [place], [place] Forestine Na, NP Audry Riles, PharmD   Please be sure to bring in all your medications bottles to every appointment.   Need to Contact us:  If you have any  questions or concerns before your next appointment please send [name] a message through [place] or call our office at [phone].    TO LEAVE A MESSAGE FOR THE NURSE SELECT OPTION 2, PLEASE LEAVE A MESSAGE INCLUDING: YOUR NAME DATE OF BIRTH CALL BACK NUMBER REASON FOR CALL**this is important as we prioritize the call backs  YOU WILL RECEIVE A CALL BACK THE SAME DAY AS LONG AS YOU CALL BEFORE 4:00 PM

## 2022-08-16 DIAGNOSIS — H40013 Open angle with borderline findings, low risk, bilateral: Secondary | ICD-10-CM | POA: Diagnosis not present

## 2022-08-16 DIAGNOSIS — E119 Type 2 diabetes mellitus without complications: Secondary | ICD-10-CM | POA: Diagnosis not present

## 2022-08-20 ENCOUNTER — Ambulatory Visit (INDEPENDENT_AMBULATORY_CARE_PROVIDER_SITE_OTHER): Payer: BC Managed Care – PPO

## 2022-08-20 ENCOUNTER — Telehealth: Payer: Self-pay

## 2022-08-20 DIAGNOSIS — I428 Other cardiomyopathies: Secondary | ICD-10-CM

## 2022-08-20 NOTE — Telephone Encounter (Signed)
Received the following alert from [employer]:   The patient is scheduled for an HM follow-up on Aug 20, 2022 The HM follow-up transmission has not arrived yet. The time of arrival depends on the HM transmission time of the implanted device  Okay per DPR to LM on VM at [phone] number.  Left details for patient to call us back to troubleshoot reconnection of remote monitor device.

## 2022-08-20 NOTE — Telephone Encounter (Signed)
The patient states he changed his room around and did not notice that his monitor plug came out. I told him to make sure it is plug in and we will look for a transmission tomorrow. If we get one we will not call back but, if the transmission does not come through we will give him a call tomorrow.

## 2022-08-21 ENCOUNTER — Telehealth: Payer: Self-pay

## 2022-08-21 LAB — CUP PACEART REMOTE DEVICE CHECK
Date Time Interrogation Session: 20231107075359
Implantable Lead Connection Status: 753985
Implantable Lead Implant Date: 20221107
Implantable Lead Location: 753860
Implantable Lead Model: 436909
Implantable Lead Serial Number: 81476621
Implantable Pulse Generator Implant Date: 20221107
Pulse Gen Model: 429525
Pulse Gen Serial Number: 84864435

## 2022-08-21 NOTE — Telephone Encounter (Signed)
Received the following alert from [employer]:   Details for arrhythmia episodes received (all types) Details were received for 2 spontaneous episodes, which were detected between Aug 08, 2022 10:32:28 PM and Aug 14, 2022 8:09:09 PM  Patient did experience the following episodes: 1 NSVT on 10/25 approx 35 beats HR 170-202 bpm 1 NSVT on 10/31 approx 22 beats HR 160-197bpm  I spoke with patient.  He had pneumonia in early October likely result of fluid overload.  Patient was to follow up with Dr. Lovena Le after discharge but had not made appt yet.  He has been taking his medication as prescribed without any missing doses.  He does complain of SOB that has increased over recent week and may be retaining fluid again.  Reviewed all with Dr. Lovena Le who states that he is not concerned with his NSVT as this is expected and why he has his ICD, but he should follow up with Dr. Johnsie Cancel his gen card regarding his fluid overload situation.   I have reached out to our scheduling team to get patient in this week with Nishan.   Patient verbalizes understanding for need of appt.   10/31 episode:   10/25 episode:

## 2022-08-21 NOTE — Telephone Encounter (Signed)
Pt monitor updated 08/21/2022

## 2022-08-23 DIAGNOSIS — I5020 Unspecified systolic (congestive) heart failure: Secondary | ICD-10-CM | POA: Insufficient documentation

## 2022-08-23 NOTE — Progress Notes (Signed)
Cardiology Office Note:    Date:  08/24/2022   ID:  Berle Mull, DOB September 18, 1961, MRN [id number]  PCP:  Johnette Abraham, MD  [place] Prov[facility] Cardiologist:  Jenkins Rouge, MD    Referring MD: Johnette Abraham, MD   Chief Complaint:  Follow-up for CHF    Patient Profile: (HFrEF) heart failure with reduced ejection fraction  Non-ischemic cardiomyopathy  Echo 2011: 45-50 Echo 2013: EF 20-25 Myoview 07/03/2021: Ant-septal and inf-lat infarct, no ischemia, EF 22; high risk LHC 07/19/2021: No CAD, severe pulm HTN (PA 83/30, mean 49), PCWP 35 Echo 07/23/2022: EF 20-25, mild LVH, apical AK and severe global HK, + apical mural thrombus, moderately reduced RVSF, mildly elevated PASP (RVSP 41), severe LAE, mild to moderate RAE, mild MR, AV calcification w/o AS, aortic root 50 mm, ascending aorta 43 mm, RAP 15 Pulmonary hypertension  RHC 10/22: mean PAP 49 NSVT PVCs  Device check: 2% burden beta-blocker  S/p ICD Hypertension  Diabetes mellitus  Chronic kidney disease  Aortic root aneurysm Echocardiogram 07/2022: Root 50 mm, asc aorta 43 mm Ex-smoker  OSA   Cardiac Studies & Procedures   CARDIAC CATHETERIZATION  CARDIAC CATHETERIZATION 07/19/2021  Narrative   LV end diastolic pressure is severely elevated.   Hemodynamic findings consistent with severe pulmonary hypertension.  No angiographic evidence of CAD Elevated right and left heart pressures (LV: 130/22/39, RA 15, RV 82/13/20, PA 83/30 mean 49, PCWP 35).  Recommendations: Will admit to telemetry for diuresis with IV Lasix given ongoing dyspnea, volume overload and severely elevated filling pressures.  Findings Coronary Findings Diagnostic  Dominance: Right  Left Anterior Descending Vessel is large.  Left Circumflex Vessel is large.  Right Coronary Artery Vessel is large.  Intervention  No interventions have been documented.   STRESS TESTS  NM MYOCAR [place] W/SPECT W  07/03/2021  Narrative   Findings are consistent with prior anteroseptal and inferolateral myocardial infarction. There is no current ischemia. The study is high risk. Risk based on scar and decreased LVEF, consider correlating LVEF with echo.   No ST deviation was noted. Converted to pharmacological stress test due to frequent ventricular ectopy with exercise. PVCs, bigeminy, and accelerated idioventricular rhythm noted.   LV perfusion is abnormal.   Left ventricular function is abnormal. Nuclear stress EF: 22 %. The left ventricular ejection fraction is severely decreased (<30%). End systolic cavity size is severely enlarged.   ECHOCARDIOGRAM  ECHOCARDIOGRAM COMPLETE 07/23/2022  Narrative ECHOCARDIOGRAM REPORT    Patient Name:   James [name] Date of Exam: 07/23/2022 Medical Rec #:  [id number]         Height:       78.0 in Accession #:    [id number]        Weight:       250.4 lb Date of Birth:  May 22, 1961         BSA:          2.484 m Patient Age:    61 years          BP:           118/79 mmHg Patient Gender: M                 HR:           64 bpm. Exam Location:  Inpatient  Procedure: 2D Echo, 3D Echo, Color Doppler, Cardiac Doppler and Intracardiac Opacification Agent  Indications:    I50.40* Unspecified combined systolic (congestive) and diastolic (congestive)  heart failure  History:        Patient has prior history of Echocardiogram examinations, most recent 07/21/2021. CHF and Cardiomyopathy, Angina, Abnormal ECG and Defibrillator, Pulmonary HTN, Arrythmias:NSVT, Signs/Symptoms:Dyspnea and Edema; Risk Factors:Hypertension, Sleep Apnea, Diabetes and Former Smoker.  Sonographer:    Roseanna Rainbow RDCS Referring Phys: [place]   1. Left ventricular ejection fraction, by estimation, is 20 to 25%. The left ventricle has severely decreased function. The left ventricle demonstrates regional wall motion abnormalities (see scoring diagram/findings for  description). The left ventricular internal cavity size was severely dilated. There is mild left ventricular hypertrophy of the basal-septal segment. Left ventricular diastolic function could not be evaluated. Elevated left ventricular end-diastolic pressure. There is akinesis of the left ventricular, apical segment and severe global hypokinesis. 2. Right ventricular systolic function is moderately reduced. The right ventricular size is moderately enlarged. There is mildly elevated pulmonary artery systolic pressure. The estimated right ventricular systolic pressure is 92.4 mmHg. 3. Left atrial size was severely dilated. 4. Right atrial size was mild to moderately dilated. 5. The mitral valve is normal in structure. Mild mitral valve regurgitation. No evidence of mitral stenosis. 6. The aortic valve is tricuspid. Aortic valve regurgitation is not visualized. Aortic valve sclerosis/calcification is present, without any evidence of aortic stenosis. 7. Aneurysm of the aortic root, measuring 50 mm. There is mild dilatation of the ascending aorta, measuring 43 mm. 8. The inferior vena cava is dilated in size with <50% respiratory variability, suggesting right atrial pressure of 15 mmHg. 9. 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  FINDINGS Left Ventricle: Left ventricular ejection fraction, by estimation, is 20 to 25%. The left ventricle has severely decreased function. The left ventricle demonstrates regional wall motion abnormalities. 3D left ventricular ejection fraction analysis performed but not reported based on interpreter judgement due to suboptimal tracking. The left ventricular internal cavity size was severely dilated. There is mild left ventricular hypertrophy of the basal-septal segment. Left ventricular diastolic function could not be evaluated. Elevated left ventricular end-diastolic pressure.  Right Ventricle: The right ventricular size is moderately  enlarged. No increase in right ventricular wall thickness. Right ventricular systolic function is moderately reduced. There is mildly elevated pulmonary artery systolic pressure. The tricuspid regurgitant velocity is 2.55 m/s, and with an assumed right atrial pressure of 15 mmHg, the estimated right ventricular systolic pressure is 26.8 mmHg.  Left Atrium: Left atrial size was severely dilated.  Right Atrium: Right atrial size was mild to moderately dilated.  Pericardium: There is no evidence of pericardial effusion.  Mitral Valve: The mitral valve is normal in structure. Mild mitral valve regurgitation. No evidence of mitral valve stenosis.  Tricuspid Valve: The tricuspid valve is normal in structure. Tricuspid valve regurgitation is trivial. No evidence of tricuspid stenosis.  Aortic Valve: The aortic valve is tricuspid. Aortic valve regurgitation is not visualized. Aortic valve sclerosis/calcification is present, without any evidence of aortic stenosis.  Pulmonic Valve: The pulmonic valve was normal in structure. Pulmonic valve regurgitation is not visualized. No evidence of pulmonic stenosis.  Aorta: The aortic root is normal in size and structure. There is mild dilatation of the ascending aorta, measuring 43 mm. There is an aneurysm involving the aortic root measuring 50 mm.  Venous: The inferior vena cava is dilated in size with less than 50% respiratory variability, suggesting right atrial pressure of 15 mmHg.  IAS/Shunts: There is right bowing of the interatrial septum, suggestive of  elevated left atrial pressure. No atrial level shunt detected by color flow Doppler.  Additional Comments: A device lead is visualized.   LEFT VENTRICLE PLAX 2D LVIDd:         7.10 cm      Diastology LVIDs:         6.10 cm      LV e' medial:    3.50 cm/s LV PW:         1.10 cm      LV E/e' medial:  31.4 LV IVS:        1.30 cm      LV e' lateral:   2.98 cm/s LVOT diam:     2.50 cm      LV E/e'  lateral: 36.9 LV SV:         71 LV SV Index:   29 LVOT Area:     4.91 cm  3D Volume EF: LV Volumes (MOD)            3D EF:        26 % LV vol d, MOD A2C: 239.0 ml LV EDV:       346 ml LV vol d, MOD A4C: 230.0 ml LV ESV:       255 ml LV vol s, MOD A2C: 233.0 ml LV SV:        91 ml LV vol s, MOD A4C: 169.0 ml LV SV MOD A2C:     6.0 ml LV SV MOD A4C:     230.0 ml LV SV MOD BP:      34.1 ml  RIGHT VENTRICLE            IVC RV S prime:     8.45 cm/s  IVC diam: 2.90 cm TAPSE (M-mode): 1.6 cm  LEFT ATRIUM              Index        RIGHT ATRIUM           Index LA diam:        6.00 cm  2.42 cm/m   RA Area:     26.30 cm LA Vol (A2C):   138.0 ml 55.56 ml/m  RA Volume:   89.70 ml  36.11 ml/m LA Vol (A4C):   112.0 ml 45.09 ml/m LA Biplane Vol: 131.0 ml 52.74 ml/m AORTIC VALVE LVOT Vmax:   80.00 cm/s LVOT Vmean:  49.700 cm/s LVOT VTI:    0.145 m  AORTA Ao Root diam: 5.00 cm Ao Asc diam:  4.45 cm  MITRAL VALVE                TRICUSPID VALVE MV Area (PHT): 4.49 cm     TR Peak grad:   26.0 mmHg MV Decel Time: 169 msec     TR Vmax:        255.00 cm/s MV E velocity: 110.00 cm/s SHUNTS Systemic VTI:  0.14 m Systemic Diam: 2.50 cm  Fransico Him MD Electronically signed by Fransico Him MD Signature Date/Time: 07/23/2022/3:30:19 PM    Final              History of Present Illness:   James [name] is a 61 y.o. male with the above problem list.  He was admitted for HF exacerbation in 07/2022. An echocardiogram demonstrated EF 20-25.  There was suggestion of apical mural thrombus.  He also had a 50 mm aortic root.  He has been seen in the advanced heart failure clinic  for TOC follow-up.  Cardiac MRI is pending to rule out infiltrative cardiomyopathy.  He has an appointment pending with Dr. Daniel Nones in the AHF clinic.  He returns for follow-up. He is here alone.  He notes dyspnea with exertion.  He describes NYHA IIb-III symptoms.  He sleeps on 2 pillows chronically.  He has had  PND in the past but none in the last couple of weeks.  He has not had lower extremity edema or increased abdominal girth.  He has not had palpitations or syncope.  Weights at home have been stable at 253 pounds.         Past Medical History:  Diagnosis Date   CKD (chronic kidney disease) stage 2, GFR 60-89 ml/min    Diabetes mellitus type II, non insulin dependent (HCC)    Dilated aortic root (HCC)    Hypercholesteremia    Hypertension    NICM (nonischemic cardiomyopathy) (HCC)    NSVT (nonsustained ventricular tachycardia) (HCC)    Obstructive sleep apnea    was positive but never given a CPAP   Pulmonary hypertension (HCC)    PVC's (premature ventricular contractions)    Tobacco abuse    Current Medications: Current Meds  Medication Sig   acetaminophen (TYLENOL) 500 MG tablet Take 1,000 mg by mouth every 6 (six) hours as needed for moderate pain.   albuterol (VENTOLIN HFA) 108 (90 Base) MCG/ACT inhaler Inhale 1-2 puffs into the lungs every 6 (six) hours as needed for wheezing or shortness of breath.   aspirin 81 MG chewable tablet Chew 81 mg by mouth in the morning.   carvedilol (COREG) 12.5 MG tablet TAKE 1 TABLET(12.5 MG) BY MOUTH TWICE DAILY   dapagliflozin propanediol (FARXIGA) 10 MG TABS tablet Take 1 tablet (10 mg total) by mouth daily before breakfast.   ENTRESTO 49-51 MG TAKE 1 TABLET BY MOUTH TWICE DAILY   furosemide (LASIX) 40 MG tablet Take 40 mg by mouth every other day.   isosorbide mononitrate (IMDUR) 60 MG 24 hr tablet TAKE 1 TABLET(60 MG) BY MOUTH DAILY   Meclizine HCl 25 MG CHEW Chew 1 tablet by mouth 3 (three) times daily as needed.   metFORMIN (GLUCOPHAGE) 500 MG tablet Take 500 mg by mouth 2 (two) times daily with a meal.    spironolactone (ALDACTONE) 25 MG tablet TAKE 1 TABLET(25 MG) BY MOUTH DAILY    Allergies:   Patient has no known allergies.   Social History   Tobacco Use   Smoking status: Former    Packs/day: 0.50    Years: 32.00    Total pack  years: 16.00    Types: Cigarettes    Start date: 10/15/1982    Quit date: 07/18/2021    Years since quitting: 1.1   Smokeless tobacco: Never   Tobacco comments:    3 cigaretts a day  Vaping Use   Vaping Use: Never used  Substance Use Topics   Alcohol use: No    Alcohol/week: 0.0 standard drinks of alcohol   Drug use: No    Family Hx: The patient's family history includes Cardiomyopathy in his brother; Coronary artery disease in his mother; Heart attack in his father; Heart disease in his maternal grandfather; Hypertension in his father; Iron deficiency in his father; Sudden death in an other family member; Thyroid disease in his sister. There is no history of Colon cancer.  ROS   EKGs/Labs/Other Test Reviewed:    EKG:  EKG is no ordered today.     Recent  Labs: 07/22/2022: B Natriuretic Peptide 3,425.0 07/30/2022: ALT 28; BUN 23; Creatinine, Ser 1.48; Hemoglobin 18.5; Platelets 166; Potassium 5.0; Sodium 138   Recent Lipid Panel No results for input(s): "CHOL", "TRIG", "HDL", "VLDL", "LDLCALC", "LDLDIRECT" in the last 8760 hours.    Risk Assessment/Calculations/Metrics:              Physical Exam:    VS:  BP 110/70   Pulse 62   Ht '6\' 6"'$  (1.981 m)   Wt 259 lb (117.5 kg)   SpO2 97%   BMI 29.93 kg/m     Wt Readings from Last 3 Encounters:  08/24/22 259 lb (117.5 kg)  08/06/22 253 lb 12.8 oz (115.1 kg)  07/30/22 253 lb 3.2 oz (114.9 kg)    Physical Exam      ASSESSMENT & PLAN:   HFrEF (heart failure with reduced ejection fraction) (HCC) EF 20-25 on echocardiogram in Oct 2023. NYHA IIb-III. Volume status on exam today is stable. Weights at home have remained stable. Overall, he feels better. He notes he is sleeping more and thinks this is helping. He has not had any episodes of paroxysmal nocturnal dyspnea in the last 2 weeks.  GDMT for CHF:  Continue - Coreg 12.5 mg twice daily, Entresto (Sacubitril/Valsartan) 49/51 mg twice daily, Farxiga 10 mg once daily,  Spironolactone 25 mg once daily, and Isosorbide mononitrate 60 mg once daily.   Cardiac MRI is pending. We will try to get this scheduled in the next 2 weeks instead of waiting until Jan 2024.  Of note his Hgb was very high in the hospital. TSH has not been checked in the last year. Labs today: BMET, BNP, Mg2+, TSH, CBC, Ferritin.   BNP will likely be high. If significantly elevated, I will increase Furosemide.  F/u with Dr. Daniel Nones in the AHF clinic next week as planned.  F/u with Dr. Johnsie Cancel in [place] in 3 mos.   Abnormal echocardiogram Echocardiogram in Oct 2023 suggested mural apical thrombus. He is not on anticoagulation. I reviewed this with Dr. Johnsie Cancel. He recommended getting his MRI first and then decide on +/- anticoagulation. As noted, will try to get the CMR done in the next 1-2 weeks.   Aortic root aneurysm (HCC) 50 mm on Echocardiogram in 07/2022. Will add Chest MRA to CMR to better size aortic root.   Pulmonary hypertension, unspecified ([place]) Reviewed AHF notes. 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 Mean PAP 49 on cath in 2022. He is to get set up for CPAP titration. Notes indicate primary care is arranging. I advised him to contact his PCP if he has not heard anything in the next 2 weeks.   NSVT (nonsustained ventricular tachycardia) S/p AICD. NSVT noted on recent device interrogation. No syncope. No palpitations. Check BMET, Mg2+. F/u with EP as planned.   Essential hypertension The patient's blood pressure is controlled on his current regimen.  Continue current therapy.    Stage 3a chronic kidney disease (HCC) GFR 53 on most recent BMET. Repeat BMET today.             Dispo:  Return in about 3 months (around 11/24/2022) for Routine Follow Up with Dr. Johnsie Cancel in [place].   Medication Adjustments/Labs and Tests Ordered: Current medicines are reviewed at length with the patient today.  Concerns regarding medicines are outlined above.  Tests Ordered: Orders  Placed This Encounter  Procedures   MR Angiogram Chest W Wo Contrast   Basic metabolic panel   Magnesium  Pro b natriuretic peptide (BNP)   CBC   TSH   Ferritin   Medication Changes: No orders of the defined types were placed in this encounter.  Signed, Richardson Dopp, PA-C  08/24/2022 1:11 PM    [facility], [place], [place]  [address] Phone: [phone]; Fax: [phone]

## 2022-08-24 ENCOUNTER — Ambulatory Visit: Payer: BC Managed Care – PPO | Attending: Physician Assistant | Admitting: Physician Assistant

## 2022-08-24 ENCOUNTER — Encounter: Payer: Self-pay | Admitting: Physician Assistant

## 2022-08-24 VITALS — BP 110/70 | HR 62 | Ht 78.0 in | Wt 259.0 lb

## 2022-08-24 DIAGNOSIS — I272 Pulmonary hypertension, unspecified: Secondary | ICD-10-CM

## 2022-08-24 DIAGNOSIS — N1831 Chronic kidney disease, stage 3a: Secondary | ICD-10-CM

## 2022-08-24 DIAGNOSIS — I7121 Aneurysm of the ascending aorta, without rupture: Secondary | ICD-10-CM

## 2022-08-24 DIAGNOSIS — R931 Abnormal findings on diagnostic imaging of heart and coronary circulation: Secondary | ICD-10-CM | POA: Diagnosis not present

## 2022-08-24 DIAGNOSIS — I5020 Unspecified systolic (congestive) heart failure: Secondary | ICD-10-CM

## 2022-08-24 DIAGNOSIS — I4729 Other ventricular tachycardia: Secondary | ICD-10-CM

## 2022-08-24 DIAGNOSIS — Q2543 Congenital aneurysm of aorta: Secondary | ICD-10-CM

## 2022-08-24 DIAGNOSIS — I10 Essential (primary) hypertension: Secondary | ICD-10-CM

## 2022-08-24 NOTE — Assessment & Plan Note (Signed)
Reviewed AHF notes. 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 Mean PAP 49 on cath in 2022. He is to get set up for CPAP titration. Notes indicate primary care is arranging. I advised him to contact his PCP if he has not heard anything in the next 2 weeks.

## 2022-08-24 NOTE — Assessment & Plan Note (Signed)
Echocardiogram in Oct 2023 suggested mural apical thrombus. He is not on anticoagulation. I reviewed this with Dr. Johnsie Cancel. He recommended getting his MRI first and then decide on +/- anticoagulation. As noted, will try to get the CMR done in the next 1-2 weeks.

## 2022-08-24 NOTE — Assessment & Plan Note (Signed)
GFR 53 on most recent BMET. Repeat BMET today.

## 2022-08-24 NOTE — Assessment & Plan Note (Addendum)
EF 20-25 on echocardiogram in Oct 2023. NYHA IIb-III. Volume status on exam today is stable. Weights at home have remained stable. Overall, he feels better. He notes he is sleeping more and thinks this is helping. He has not had any episodes of paroxysmal nocturnal dyspnea in the last 2 weeks.  GDMT for CHF:  Continue - Coreg 12.5 mg twice daily, Entresto (Sacubitril/Valsartan) 49/51 mg twice daily, Farxiga 10 mg once daily, Spironolactone 25 mg once daily, and Isosorbide mononitrate 60 mg once daily.   Cardiac MRI is pending. We will try to get this scheduled in the next 2 weeks instead of waiting until Jan 2024.  Of note his Hgb was very high in the hospital. TSH has not been checked in the last year. Labs today: BMET, BNP, Mg2+, TSH, CBC, Ferritin.   BNP will likely be high. If significantly elevated, I will increase Furosemide.  F/u with Dr. Daniel Nones in the AHF clinic next week as planned.  F/u with Dr. Johnsie Cancel in [place] in 3 mos.

## 2022-08-24 NOTE — Assessment & Plan Note (Signed)
50 mm on Echocardiogram in 07/2022. Will add Chest MRA to CMR to better size aortic root.

## 2022-08-24 NOTE — Patient Instructions (Signed)
Medication Instructions:  Your physician recommends that you continue on your current medications as directed. Please refer to the Current Medication list given to you today.  *If you need a refill on your cardiac medications before your next appointment, please call your pharmacy*   Lab Work: TODAY:  BMET, MAG, PRO BNP, CBC, TSH, & FERRITIN  3 If you have labs (blood work) drawn today and your tests are completely normal, you will receive your results only by: [place] (if you have MyChart) OR A paper copy in the mail If you have any lab test that is abnormal or we need to change your treatment, we will call you to review the results.   Testing/Procedures: You're physician recommends that you have a Chest MRAngiogram.  I have asked that this be done the same day as the Cardiac MRI.  I have reached out to the scheduling team to move that up sooner, so someone should reach out to you.   Follow-Up: At [facility], you and your health needs are our priority.  As part of our continuing mission to provide you with exceptional heart care, we have created designated Provider Care Teams.  These Care Teams include your primary Cardiologist (physician) and Advanced Practice Providers (APPs -  Physician Assistants and Nurse Practitioners) who all work together to provide you with the care you need, when you need it.  We recommend signing up for the patient portal called "MyChart".  Sign up information is provided on this After Visit Summary.  MyChart is used to connect with patients for Virtual Visits (Telemedicine).  Patients are able to view lab/test results, encounter notes, upcoming appointments, etc.  Non-urgent messages can be sent to your provider as well.   To learn more about what you can do with MyChart, go to [email or web address].    Your next appointment:   3 month(s)  The format for your next appointment:   In Person  Provider:   Jenkins Rouge, MD    Other  Instructions If you have no sleep study in the next 2 weeks, call your Primary Care Dr.  Nedra Hai Information About Sugar

## 2022-08-24 NOTE — Assessment & Plan Note (Signed)
S/p AICD. NSVT noted on recent device interrogation. No syncope. No palpitations. Check BMET, Mg2+. F/u with EP as planned.

## 2022-08-24 NOTE — Assessment & Plan Note (Signed)
The patient's blood pressure is controlled on his current regimen.  Continue current therapy.  

## 2022-08-25 LAB — PRO B NATRIURETIC PEPTIDE: NT-Pro BNP: 2411 pg/mL — ABNORMAL HIGH (ref 0–210)

## 2022-08-25 LAB — BASIC METABOLIC PANEL
BUN/Creatinine Ratio: 18 (ref 10–24)
BUN: 23 mg/dL (ref 8–27)
CO2: 23 mmol/L (ref 20–29)
Calcium: 8.9 mg/dL (ref 8.6–10.2)
Chloride: 104 mmol/L (ref 96–106)
Creatinine, Ser: 1.3 mg/dL — ABNORMAL HIGH (ref 0.76–1.27)
Glucose: 108 mg/dL — ABNORMAL HIGH (ref 70–99)
Potassium: 4.7 mmol/L (ref 3.5–5.2)
Sodium: 139 mmol/L (ref 134–144)
eGFR: 63 mL/min/{1.73_m2} (ref >59)

## 2022-08-25 LAB — CBC
Hematocrit: 50.7 % (ref 37.5–51.0)
Hemoglobin: 17.3 g/dL (ref 13.0–17.7)
MCH: 32.4 pg (ref 26.6–33.0)
MCHC: 34.1 g/dL (ref 31.5–35.7)
MCV: 95 fL (ref 79–97)
Platelets: 159 10*3/uL (ref 150–450)
RBC: 5.34 x10E6/uL (ref 4.14–5.80)
RDW: 13.8 % (ref 11.6–15.4)
WBC: 8.7 10*3/uL (ref 3.4–10.8)

## 2022-08-25 LAB — FERRITIN: Ferritin: 255 ng/mL (ref 30–400)

## 2022-08-25 LAB — TSH: TSH: 0.58 u[IU]/mL (ref 0.450–4.500)

## 2022-08-25 LAB — MAGNESIUM: Magnesium: 2.1 mg/dL (ref 1.6–2.3)

## 2022-08-26 ENCOUNTER — Other Ambulatory Visit (HOSPITAL_COMMUNITY): Payer: Self-pay | Admitting: Cardiology

## 2022-08-27 ENCOUNTER — Ambulatory Visit (HOSPITAL_COMMUNITY)
Admission: RE | Admit: 2022-08-27 | Discharge: 2022-08-27 | Disposition: A | Discharge status: Home / Self Care | Payer: BC Managed Care – PPO | Source: Ambulatory Visit | Attending: Cardiology | Admitting: Cardiology

## 2022-08-27 ENCOUNTER — Encounter (HOSPITAL_COMMUNITY): Payer: Self-pay | Admitting: Cardiology

## 2022-08-27 VITALS — BP 102/70 | HR 67 | Wt 251.6 lb

## 2022-08-27 DIAGNOSIS — I7121 Aneurysm of the ascending aorta, without rupture: Secondary | ICD-10-CM | POA: Diagnosis not present

## 2022-08-27 DIAGNOSIS — I5022 Chronic systolic (congestive) heart failure: Secondary | ICD-10-CM | POA: Diagnosis not present

## 2022-08-27 DIAGNOSIS — I509 Heart failure, unspecified: Secondary | ICD-10-CM | POA: Diagnosis not present

## 2022-08-27 DIAGNOSIS — Z7984 Long term (current) use of oral hypoglycemic drugs: Secondary | ICD-10-CM | POA: Diagnosis not present

## 2022-08-27 DIAGNOSIS — I10 Essential (primary) hypertension: Secondary | ICD-10-CM

## 2022-08-27 DIAGNOSIS — I719 Aortic aneurysm of unspecified site, without rupture: Secondary | ICD-10-CM | POA: Insufficient documentation

## 2022-08-27 DIAGNOSIS — Q2543 Congenital aneurysm of aorta: Secondary | ICD-10-CM

## 2022-08-27 DIAGNOSIS — Z95810 Presence of automatic (implantable) cardiac defibrillator: Secondary | ICD-10-CM | POA: Diagnosis not present

## 2022-08-27 DIAGNOSIS — I428 Other cardiomyopathies: Secondary | ICD-10-CM | POA: Diagnosis not present

## 2022-08-27 DIAGNOSIS — I444 Left anterior fascicular block: Secondary | ICD-10-CM | POA: Diagnosis not present

## 2022-08-27 DIAGNOSIS — B351 Tinea unguium: Secondary | ICD-10-CM | POA: Diagnosis not present

## 2022-08-27 DIAGNOSIS — I130 Hypertensive heart and chronic kidney disease with heart failure and stage 1 through stage 4 chronic kidney disease, or unspecified chronic kidney disease: Secondary | ICD-10-CM | POA: Insufficient documentation

## 2022-08-27 DIAGNOSIS — E1122 Type 2 diabetes mellitus with diabetic chronic kidney disease: Secondary | ICD-10-CM | POA: Insufficient documentation

## 2022-08-27 DIAGNOSIS — N182 Chronic kidney disease, stage 2 (mild): Secondary | ICD-10-CM | POA: Insufficient documentation

## 2022-08-27 DIAGNOSIS — L851 Acquired keratosis [keratoderma] palmaris et plantaris: Secondary | ICD-10-CM | POA: Diagnosis not present

## 2022-08-27 DIAGNOSIS — Z79899 Other long term (current) drug therapy: Secondary | ICD-10-CM | POA: Insufficient documentation

## 2022-08-27 DIAGNOSIS — I5020 Unspecified systolic (congestive) heart failure: Secondary | ICD-10-CM | POA: Diagnosis not present

## 2022-08-27 DIAGNOSIS — E1142 Type 2 diabetes mellitus with diabetic polyneuropathy: Secondary | ICD-10-CM | POA: Diagnosis not present

## 2022-08-27 LAB — BASIC METABOLIC PANEL
Anion gap: 9 (ref 5–15)
BUN: 20 mg/dL (ref 8–23)
CO2: 24 mmol/L (ref 22–32)
Calcium: 8.8 mg/dL — ABNORMAL LOW (ref 8.9–10.3)
Chloride: 107 mmol/L (ref 98–111)
Creatinine, Ser: 1.37 mg/dL — ABNORMAL HIGH (ref 0.61–1.24)
GFR, Estimated: 59 mL/min — ABNORMAL LOW (ref >60)
Glucose, Bld: 135 mg/dL — ABNORMAL HIGH (ref 70–99)
Potassium: 4.1 mmol/L (ref 3.5–5.1)
Sodium: 140 mmol/L (ref 135–145)

## 2022-08-27 LAB — BRAIN NATRIURETIC PEPTIDE: B Natriuretic Peptide: 1067.1 pg/mL — ABNORMAL HIGH (ref 0.0–100.0)

## 2022-08-27 NOTE — Progress Notes (Addendum)
ADVANCED HEART FAILURE CLINIC NOTE  Referring Physician: Johnette Abraham, MD  Primary Care: James Abraham, MD Primary Cardiologist:  HPI: James [name] is a 61 y.o. male with nonischemic cardiomyopathy s/p ICD, HTNm, T2DM, aortic root aneurysm presenting today to establish care. James [name] HF dates back to 2011 when he was diagnosed with nonischemic cardiomyopathy.  According to James [name] he came to the [facility] system in 2013 when he had a left heart cath with no coronary disease with a EF of 20%, he was started on low-dose GDMT at that time.  By 2013 his EF improved to 45%.  He followed yearly with James [name] admission until 2022 when he once again had a reduction in LVEF to 20 to 25% patient was started on EntrestoWith a repeat heart cath demonstrating no significant coronary disease.  For the past 1 year despite the addition of GDMT, patient has had progressively worsening exercise capacity with no improvement in LV function.  Referred to heart failure for further evaluation.  From a functional standpoint, James [name] can walk 30-50 feet before having to stop for shortness of breath.  No PND, no orthopnea or lower extremity edema.At this time, doing fairly well from a functional standpoint.   Activity level/exercise tolerance: NYHA IIb Orthopnea:  Sleeps on 2 pillows Paroxysmal noctural dyspnea: No Chest pain/pressure: No Orthostatic lightheadedness: No Palpitations: No Lower extremity edema: No Presyncope/syncope: No Cough: No  Past Medical History:  Diagnosis Date   CKD (chronic kidney disease) stage 2, GFR 60-89 ml/min    Diabetes mellitus type II, non insulin dependent (HCC)    Dilated aortic root (HCC)    Hypercholesteremia    Hypertension    NICM (nonischemic cardiomyopathy) (HCC)    NSVT (nonsustained ventricular tachycardia) (HCC)    Obstructive sleep apnea    was positive but never given a CPAP   Pulmonary hypertension (HCC)    PVC's (premature ventricular  contractions)    Tobacco abuse     Current Outpatient Medications  Medication Sig Dispense Refill   acetaminophen (TYLENOL) 500 MG tablet Take 1,000 mg by mouth every 6 (six) hours as needed for moderate pain.     albuterol (VENTOLIN HFA) 108 (90 Base) MCG/ACT inhaler Inhale 1-2 puffs into the lungs every 6 (six) hours as needed for wheezing or shortness of breath. 18 g 0   aspirin 81 MG chewable tablet Chew 81 mg by mouth in the morning.     carvedilol (COREG) 12.5 MG tablet TAKE 1 TABLET(12.5 MG) BY MOUTH TWICE DAILY 60 tablet 2   dapagliflozin propanediol (FARXIGA) 10 MG TABS tablet Take 1 tablet (10 mg total) by mouth daily before breakfast. 30 tablet 11   ENTRESTO 49-51 MG TAKE 1 TABLET BY MOUTH TWICE DAILY 60 tablet 6   furosemide (LASIX) 40 MG tablet Take 40 mg by mouth every other day.     isosorbide mononitrate (IMDUR) 60 MG 24 hr tablet TAKE 1 TABLET(60 MG) BY MOUTH DAILY 30 tablet 10   Meclizine HCl 25 MG CHEW Chew 1 tablet by mouth 3 (three) times daily as needed.     metFORMIN (GLUCOPHAGE) 500 MG tablet Take 500 mg by mouth 2 (two) times daily with a meal.      spironolactone (ALDACTONE) 25 MG tablet TAKE 1 TABLET(25 MG) BY MOUTH DAILY 90 tablet 3   No current facility-administered medications for this encounter.    No Known Allergies    Social History   Socioeconomic History  Marital status: Married    Spouse name: James [name]   Number of children: 4   Years of education: Not on file   Highest education level: Associate degree: occupational, Hotel manager, or vocational program  Occupational History   Occupation: Librarian, academic    Comment: [employer]  Tobacco Use   Smoking status: Former    Packs/day: 0.50    Years: 32.00    Total pack years: 16.00    Types: Cigarettes    Start date: 10/15/1982    Quit date: 07/18/2021    Years since quitting: 1.1   Smokeless tobacco: Never   Tobacco comments:    3 cigaretts a day  Vaping Use   Vaping Use: Never used   Substance and Sexual Activity   Alcohol use: No    Alcohol/week: 0.0 standard drinks of alcohol   Drug use: No   Sexual activity: Yes    Birth control/protection: None  Other Topics Concern   Not on file  Social History Narrative   Divorced   No regular exercise   Social Determinants of Health   Financial Resource Strain: Low Risk  (07/21/2021)   Overall Financial Resource Strain (CARDIA)    Difficulty of Paying Living Expenses: Not very hard  Food Insecurity: No Food Insecurity (07/22/2022)   Hunger Vital Sign    Worried About Running Out of Food in the Last Year: Never true    Ran Out of Food in the Last Year: Never true  Transportation Needs: No Transportation Needs (07/22/2022)   PRAPARE - Hydrologist (Medical): No    Lack of Transportation (Non-Medical): No  Physical Activity: Not on file  Stress: Not on file  Social Connections: Not on file  Intimate Partner Violence: Not At Risk (07/22/2022)   Humiliation, Afraid, Rape, and Kick questionnaire    Fear of Current or Ex-Partner: No    Emotionally Abused: No    Physically Abused: No    Sexually Abused: No      Family History  Problem Relation Age of Onset   Heart attack Father    Hypertension Father    Iron deficiency Father    Coronary artery disease Mother    Heart disease Maternal Grandfather        Also paternal grandfather   Cardiomyopathy Brother        No significant coronary disease by coronary angiography   Thyroid disease Sister    Sudden death Other    Colon cancer Neg Hx     PHYSICAL EXAM: Vitals:   08/27/22 0908  BP: 102/70  Pulse: 67  SpO2: 98%   GENERAL: Well nourished, well developed, and in no apparent distress at rest.  HEENT: Negative for arcus senilis or xanthelasma. There is no scleral icterus.  The mucous membranes are pink and moist.   NECK: Supple, No masses. Normal carotid upstrokes without bruits. No masses or thyromegaly.    CHEST: There are no  chest wall deformities. There is no chest wall tenderness. Respirations are unlabored.  Lungs-CTA bilaterally CARDIAC:  JVP: Less than 7 cm H2O         Normal S1, S2  Normal rate with regular rhythm. No murmurs, rubs or gallops.  Pulses are 2+ and symmetrical in upper and lower extremities.  No edema.  ABDOMEN: Soft, non-tender, non-distended. There are no masses or hepatomegaly. There are normal bowel sounds.  EXTREMITIES: Warm and well perfused with no cyanosis, clubbing.  LYMPHATIC: No axillary or supraclavicular  lymphadenopathy.  NEUROLOGIC: Patient is oriented x3 with no focal or lateralizing neurologic deficits.  PSYCH: Patients affect is appropriate, there is no evidence of anxiety or depression.  SKIN: Warm and dry; no lesions or wounds.   DATA REVIEW  ECG: Normal sinus rhythm  ECHO: 07/23/2022: LVEF 20 to 25%, severely dilated, RV function moderately reduced, LA severely dilated, mild MR.  CATH: RHC/LHC (07/19/21): No angiographic evidence of CAD Elevated right and left heart pressures (LV: 130/22/39, RA 15, RV 82/13/20, PA 83/30 mean 49, PCWP 35).    ASSESSMENT & PLAN:  NYHA IIb, stage C nonischemic cardiomyopathy Etiology of HF: Nonischemic cardiomyopathy likely idiopathic dilated however cannot rule out infiltrative disease.  Plan for cardiac MRI tomorrow NYHA class / AHA Stage: NYHA IIb Volume status & Diuretics: Euvolemic Vasodilators: Entresto 49/51 twice daily Beta-Blocker: Coreg 12.5 twice daily MRA: Spironolactone 25 Cardiometabolic: Farxiga 10 mg daily Devices therapies & Valvulopathies: Primary prevention ICD in place Advanced therapies: Despite the addition of GDMT which we are unable to uptitrate at this time due to low blood pressures patient has continued to have dismal LV function.  We will obtain cardiac MRI for further evaluation.  TTE in 2 months. Plan for CPX within the next 1 to 2 months followed by right heart cath.  Start evaluation for advanced  therapies in the near future. Previously based on PA mean transplant would be contraindicated, however, this was done while the patient was congested. Will repeat RHC in the near future.   2. CKD - Repeat labs pending, sCr baseline ~1.3.   3. Aortic root aneurysm - 5cm by echo in 10/23. Stable.   James [name] Advanced Heart Failure Mechanical Circulatory Support

## 2022-08-27 NOTE — Patient Instructions (Signed)
Medication Changes:  None, continue current medications  Lab Work:  Labs done today, your results will be available in MyChart, we will contact you for abnormal readings.  Testing/Procedures:  Your physician has recommended that you have a cardiopulmonary stress test (CPX). CPX testing is a non-invasive measurement of heart and lung function. It replaces a traditional treadmill stress test. This type of test provides a tremendous amount of information that relates not only to your present condition but also for future outcomes. This test combines measurements of you ventilation, respiratory gas exchange in the lungs, electrocardiogram (EKG), blood pressure and physical response before, during, and following an exercise protocol.  Your physician has requested that you have an echocardiogram. Echocardiography is a painless test that uses sound waves to create images of your heart. It provides your doctor with information about the size and shape of your heart and how well your heart's chambers and valves are working. This procedure takes approximately one hour. There are no restrictions for this procedure. Please do NOT wear cologne, perfume, aftershave, or lotions (deodorant is allowed). Please arrive 15 minutes prior to your appointment time.   Referrals:  none  Special Instructions // Education:  Do the following things EVERYDAY: Weigh yourself in the morning before breakfast. Write it down and keep it in a log. Take your medicines as prescribed Eat low salt foods--Limit salt (sodium) to 2000 mg per day.  Stay as active as you can everyday Limit all fluids for the day to less than 2 liters   Follow-Up in: 2 months with an echocardiogram  At the [facility], you and your health needs are our priority. We have a designated team specialized in the treatment of Heart Failure. This Care Team includes your primary Heart Failure Specialized Cardiologist (physician),  Advanced Practice Providers (APPs- Physician Assistants and Nurse Practitioners), and Pharmacist who all work together to provide you with the care you need, when you need it.   You may see any of the following providers on your designated Care Team at your next follow up:  Dr. Glori Bickers Dr. Loralie Champagne Dr. Roxana Hires, NP Lyda Jester, [place] [facility] [place], [place] Forestine Na, NP Audry Riles, PharmD   Please be sure to bring in all your medications bottles to every appointment.   Need to Contact us:  If you have any questions or concerns before your next appointment please send [name] a message through [place] or call our office at [phone].    TO LEAVE A MESSAGE FOR THE NURSE SELECT OPTION 2, PLEASE LEAVE A MESSAGE INCLUDING: YOUR NAME DATE OF BIRTH CALL BACK NUMBER REASON FOR CALL**this is important as we prioritize the call backs  YOU WILL RECEIVE A CALL BACK THE SAME DAY AS LONG AS YOU CALL BEFORE 4:00 PM

## 2022-08-28 ENCOUNTER — Encounter (HOSPITAL_COMMUNITY): Payer: Self-pay

## 2022-08-28 ENCOUNTER — Ambulatory Visit (HOSPITAL_COMMUNITY): Admission: RE | Admit: 2022-08-28 | Payer: BC Managed Care – PPO | Source: Ambulatory Visit

## 2022-08-28 ENCOUNTER — Telehealth (HOSPITAL_COMMUNITY): Payer: Self-pay

## 2022-08-28 NOTE — Telephone Encounter (Signed)
error 

## 2022-08-30 ENCOUNTER — Other Ambulatory Visit: Payer: Self-pay | Admitting: *Deleted

## 2022-08-30 ENCOUNTER — Other Ambulatory Visit: Payer: Self-pay

## 2022-08-30 ENCOUNTER — Encounter (HOSPITAL_COMMUNITY): Payer: Self-pay

## 2022-08-30 ENCOUNTER — Telehealth: Payer: Self-pay | Admitting: Internal Medicine

## 2022-08-30 DIAGNOSIS — N1831 Chronic kidney disease, stage 3a: Secondary | ICD-10-CM

## 2022-08-30 DIAGNOSIS — I5020 Unspecified systolic (congestive) heart failure: Secondary | ICD-10-CM

## 2022-08-30 NOTE — Telephone Encounter (Signed)
Terri with [facility] called in on patient behalf in regard to recent order.  Unable to do cpap filtration due to patient last diagnostic sleep study being done years ago. And patient hasn't been on a cpap machine.   Order should be for diagnostic sleep study or split night.  Call back number [phone]

## 2022-08-31 ENCOUNTER — Other Ambulatory Visit: Payer: Self-pay

## 2022-08-31 DIAGNOSIS — G4733 Obstructive sleep apnea (adult) (pediatric): Secondary | ICD-10-CM

## 2022-09-11 ENCOUNTER — Other Ambulatory Visit: Payer: BC Managed Care – PPO

## 2022-09-17 NOTE — Progress Notes (Signed)
Remote ICD transmission.   

## 2022-09-26 ENCOUNTER — Other Ambulatory Visit: Payer: BC Managed Care – PPO

## 2022-09-28 ENCOUNTER — Encounter: Payer: Self-pay | Admitting: Cardiovascular Disease

## 2022-10-11 ENCOUNTER — Telehealth (HOSPITAL_COMMUNITY): Payer: Self-pay | Admitting: *Deleted

## 2022-10-11 NOTE — Telephone Encounter (Signed)
   CMRI no pre cert reqd

## 2022-10-22 ENCOUNTER — Encounter (HOSPITAL_COMMUNITY): Payer: Self-pay | Admitting: Cardiology

## 2022-10-22 ENCOUNTER — Ambulatory Visit (HOSPITAL_COMMUNITY)
Admission: RE | Admit: 2022-10-22 | Discharge: 2022-10-22 | Disposition: A | Discharge status: Home / Self Care | Payer: BC Managed Care – PPO | Source: Ambulatory Visit | Attending: Cardiology | Admitting: Cardiology

## 2022-10-22 ENCOUNTER — Other Ambulatory Visit (HOSPITAL_COMMUNITY): Payer: Self-pay

## 2022-10-22 ENCOUNTER — Ambulatory Visit (HOSPITAL_BASED_OUTPATIENT_CLINIC_OR_DEPARTMENT_OTHER)
Admission: RE | Admit: 2022-10-22 | Discharge: 2022-10-22 | Disposition: A | Discharge status: Home / Self Care | Payer: BC Managed Care – PPO | Source: Ambulatory Visit

## 2022-10-22 VITALS — BP 121/89 | HR 64 | Wt 247.2 lb

## 2022-10-22 DIAGNOSIS — I130 Hypertensive heart and chronic kidney disease with heart failure and stage 1 through stage 4 chronic kidney disease, or unspecified chronic kidney disease: Secondary | ICD-10-CM | POA: Diagnosis not present

## 2022-10-22 DIAGNOSIS — I428 Other cardiomyopathies: Secondary | ICD-10-CM | POA: Diagnosis not present

## 2022-10-22 DIAGNOSIS — N189 Chronic kidney disease, unspecified: Secondary | ICD-10-CM | POA: Insufficient documentation

## 2022-10-22 DIAGNOSIS — I5020 Unspecified systolic (congestive) heart failure: Secondary | ICD-10-CM | POA: Diagnosis not present

## 2022-10-22 DIAGNOSIS — Z79899 Other long term (current) drug therapy: Secondary | ICD-10-CM | POA: Diagnosis not present

## 2022-10-22 DIAGNOSIS — I5022 Chronic systolic (congestive) heart failure: Secondary | ICD-10-CM

## 2022-10-22 DIAGNOSIS — I7121 Aneurysm of the ascending aorta, without rupture: Secondary | ICD-10-CM

## 2022-10-22 DIAGNOSIS — I509 Heart failure, unspecified: Secondary | ICD-10-CM | POA: Diagnosis not present

## 2022-10-22 DIAGNOSIS — Z95810 Presence of automatic (implantable) cardiac defibrillator: Secondary | ICD-10-CM | POA: Diagnosis not present

## 2022-10-22 DIAGNOSIS — N1831 Chronic kidney disease, stage 3a: Secondary | ICD-10-CM | POA: Diagnosis not present

## 2022-10-22 DIAGNOSIS — E1122 Type 2 diabetes mellitus with diabetic chronic kidney disease: Secondary | ICD-10-CM | POA: Diagnosis not present

## 2022-10-22 LAB — BASIC METABOLIC PANEL
Anion gap: 8 (ref 5–15)
BUN: 32 mg/dL — ABNORMAL HIGH (ref 8–23)
CO2: 22 mmol/L (ref 22–32)
Calcium: 8.8 mg/dL — ABNORMAL LOW (ref 8.9–10.3)
Chloride: 106 mmol/L (ref 98–111)
Creatinine, Ser: 1.55 mg/dL — ABNORMAL HIGH (ref 0.61–1.24)
GFR, Estimated: 51 mL/min — ABNORMAL LOW (ref >60)
Glucose, Bld: 124 mg/dL — ABNORMAL HIGH (ref 70–99)
Potassium: 4.8 mmol/L (ref 3.5–5.1)
Sodium: 136 mmol/L (ref 135–145)

## 2022-10-22 LAB — ECHOCARDIOGRAM COMPLETE
Area-P 1/2: 3.51 cm2
Calc EF: 16.2 %
Est EF: <20
S' Lateral: 4.8 cm
Single Plane A2C EF: 15.3 %
Single Plane A4C EF: 16.3 %

## 2022-10-22 LAB — BRAIN NATRIURETIC PEPTIDE: B Natriuretic Peptide: 787.3 pg/mL — ABNORMAL HIGH (ref 0.0–100.0)

## 2022-10-22 MED ORDER — ENTRESTO 97-103 MG PO TABS: 1.0000 | ORAL_TABLET | Freq: Two times a day (BID) | ORAL | 11 refills | Status: DC

## 2022-10-22 NOTE — Patient Instructions (Addendum)
STOP Imdur  INCREASE Entresto to 97/103 mg Twice daily  Labs done today, your results will be available in MyChart, we will contact you for abnormal readings.  You are scheduled for a Cardiac Catheterization on Friday, January 24 with Dr.  Daniel Nones .  1. Please arrive at the [facility] (Main Entrance A) at [facility]: [address] [place], [place] [address] at 5:30 AM (This time is two hours before your procedure to ensure your preparation). Free valet parking service is available.   Special note: Every effort is made to have your procedure done on time. Please understand that emergencies sometimes delay scheduled procedures.  2. Diet: Do not eat solid foods after midnight.  The patient may have clear liquids until 5am upon the day of the procedure.  3. Medication instructions in preparation for your procedure:   Contrast Allergy: No  HOLD FARXIGA, LASIX AND SPIRONOLACTONE THE MORNING OF YOUR PROCEDURE   Do not take Diabetes Med Glucophage (Metformin) on the day of the procedure and HOLD 48 HOURS AFTER THE PROCEDURE.  On the morning of your procedure, take any morning medicines NOT listed above.  You may use sips of water.  5. Plan for one night stay--bring personal belongings. 6. Bring a current list of your medications and current insurance cards. 7. You MUST have a responsible person to drive you home. 8. Someone MUST be with you the first 24 hours after you arrive home or your discharge will be delayed. 9. Please wear clothes that are easy to get on and off and wear slip-on shoes.   Your physician recommends that you schedule a follow-up appointment in: 3 months ( April ) ** please call the office in February to arrange your follow up appointment   If you have any questions or concerns before your next appointment please send [name] a message through [place] or call our office at [phone].    TO LEAVE A MESSAGE FOR THE NURSE SELECT OPTION 2, PLEASE LEAVE A MESSAGE  INCLUDING: YOUR NAME DATE OF BIRTH CALL BACK NUMBER REASON FOR CALL**this is important as we prioritize the call backs  YOU WILL RECEIVE A CALL BACK THE SAME DAY AS LONG AS YOU CALL BEFORE 4:00 PM  At the [facility], you and your health needs are our priority. As part of our continuing mission to provide you with exceptional heart care, we have created designated Provider Care Teams. These Care Teams include your primary Cardiologist (physician) and Advanced Practice Providers (APPs- Physician Assistants and Nurse Practitioners) who all work together to provide you with the care you need, when you need it.   You may see any of the following providers on your designated Care Team at your next follow up: Dr Glori Bickers Dr Loralie Champagne Dr. Roxana Hires, NP Lyda Jester, [place] [facility] [place], [place] Forestine Na, NP Audry Riles, PharmD   Please be sure to bring in all your medications bottles to every appointment.

## 2022-10-22 NOTE — Progress Notes (Signed)
ADVANCED HEART FAILURE CLINIC NOTE  Referring Physician: Johnette Abraham, MD  Primary Care: Johnette Abraham, MD Primary Cardiologist:  HPI: James [name] is a 62 y.o. male with nonischemic cardiomyopathy s/p ICD, HTNm, T2DM, aortic root aneurysm presenting today to establish care. James [name] HF dates back to 2011 when he was diagnosed with nonischemic cardiomyopathy.  According to James [name] he came to the [facility] system in 2013 when he had a left heart cath with no coronary disease with a EF of 20%, he was started on low-dose GDMT at that time.  By 2013 his EF improved to 45%.  He followed yearly with Dr. Collier Salina admission until 2022 when he once again had a reduction in LVEF to 20 to 25% patient was started on Entresto With a repeat heart cath demonstrating no significant coronary disease.  For the past 1 year despite the addition of GDMT, patient has had progressively worsening exercise capacity with no improvement in LV function.  Referred to heart failure for further evaluation.  Interval hx:  Since his last appointment, no longer having issues with SOB, LE edema; he is able to ambulate further without difficulty; no PND; reports compliance with all HF meds.   Activity level/exercise tolerance: NYHA IIb Orthopnea:  Sleeps on 2 pillows Paroxysmal noctural dyspnea: No Chest pain/pressure: No Orthostatic lightheadedness: No Palpitations: No Lower extremity edema: No Presyncope/syncope: No Cough: No  Past Medical History:  Diagnosis Date   CKD (chronic kidney disease) stage 2, GFR 60-89 ml/min    Diabetes mellitus type II, non insulin dependent (HCC)    Dilated aortic root (HCC)    Hypercholesteremia    Hypertension    NICM (nonischemic cardiomyopathy) (HCC)    NSVT (nonsustained ventricular tachycardia) (HCC)    Obstructive sleep apnea    was positive but never given a CPAP   Pulmonary hypertension (HCC)    PVC's (premature ventricular contractions)    Tobacco abuse      Current Outpatient Medications  Medication Sig Dispense Refill   acetaminophen (TYLENOL) 500 MG tablet Take 1,000 mg by mouth every 6 (six) hours as needed for moderate pain.     albuterol (VENTOLIN HFA) 108 (90 Base) MCG/ACT inhaler Inhale 1-2 puffs into the lungs every 6 (six) hours as needed for wheezing or shortness of breath. 18 g 0   aspirin 81 MG chewable tablet Chew 81 mg by mouth in the morning.     carvedilol (COREG) 12.5 MG tablet TAKE 1 TABLET(12.5 MG) BY MOUTH TWICE DAILY 60 tablet 2   FARXIGA 10 MG TABS tablet TAKE 1 TABLET(10 MG) BY MOUTH DAILY BEFORE BREAKFAST 30 tablet 11   furosemide (LASIX) 40 MG tablet Take 40 mg by mouth as directed. Take 40 mg qod alt with 20 mg qod     Meclizine HCl 25 MG CHEW Chew 1 tablet by mouth 3 (three) times daily as needed.     metFORMIN (GLUCOPHAGE) 500 MG tablet Take 500 mg by mouth 2 (two) times daily with a meal.      sacubitril-valsartan (ENTRESTO) 97-103 MG Take 1 tablet by mouth 2 (two) times daily. 60 tablet 11   spironolactone (ALDACTONE) 25 MG tablet TAKE 1 TABLET(25 MG) BY MOUTH DAILY 90 tablet 3   No current facility-administered medications for this encounter.    No Known Allergies    Social History   Socioeconomic History   Marital status: Married    Spouse name: Laureano Hetzer   Number of children:  4   Years of education: Not on file   Highest education level: Associate degree: occupational, Hotel manager, or vocational program  Occupational History   Occupation: supervisor    Comment: [employer]  Tobacco Use   Smoking status: Former    Packs/day: 0.50    Years: 32.00    Total pack years: 16.00    Types: Cigarettes    Start date: 10/15/1982    Quit date: 07/18/2021    Years since quitting: 1.2   Smokeless tobacco: Never   Tobacco comments:    3 cigaretts a day  Vaping Use   Vaping Use: Never used  Substance and Sexual Activity   Alcohol use: No    Alcohol/week: 0.0 standard drinks of alcohol   Drug use:  No   Sexual activity: Yes    Birth control/protection: None  Other Topics Concern   Not on file  Social History Narrative   Divorced   No regular exercise   Social Determinants of Health   Financial Resource Strain: Low Risk  (07/21/2021)   Overall Financial Resource Strain (CARDIA)    Difficulty of Paying Living Expenses: Not very hard  Food Insecurity: No Food Insecurity (07/22/2022)   Hunger Vital Sign    Worried About Running Out of Food in the Last Year: Never true    Ran Out of Food in the Last Year: Never true  Transportation Needs: No Transportation Needs (07/22/2022)   PRAPARE - Hydrologist (Medical): No    Lack of Transportation (Non-Medical): No  Physical Activity: Not on file  Stress: Not on file  Social Connections: Not on file  Intimate Partner Violence: Not At Risk (07/22/2022)   Humiliation, Afraid, Rape, and Kick questionnaire    Fear of Current or Ex-Partner: No    Emotionally Abused: No    Physically Abused: No    Sexually Abused: No      Family History  Problem Relation Age of Onset   Heart attack Father    Hypertension Father    Iron deficiency Father    Coronary artery disease Mother    Heart disease Maternal Grandfather        Also paternal grandfather   Cardiomyopathy Brother        No significant coronary disease by coronary angiography   Thyroid disease Sister    Sudden death Other    Colon cancer Neg Hx     PHYSICAL EXAM: Vitals:   10/22/22 1005  BP: 121/89  Pulse: 64  SpO2: 98%    GENERAL: Well nourished, well developed, and in no apparent distress at rest.  HEENT: Negative for arcus senilis or xanthelasma. There is no scleral icterus.  The mucous membranes are pink and moist.   NECK: Supple, No masses. Normal carotid upstrokes without bruits. No masses or thyromegaly.    CHEST: There are no chest wall deformities. There is no chest wall tenderness. Respirations are unlabored.  Lungs-CTA  bilaterally CARDIAC:  JVP: <7cm          Normal S1, S2  Normal rate with regular rhythm. No murmurs, rubs or gallops.  Pulses are 2+ and symmetrical in upper and lower extremities.  No edema.  ABDOMEN: soft, nontender EXTREMITIES: warm with no edema LYMPHATIC: No axillary or supraclavicular lymphadenopathy.  NEUROLOGIC: Patient is oriented x3 with no focal or lateralizing neurologic deficits.  PSYCH: Patients affect is appropriate, there is no evidence of anxiety or depression.  SKIN: Warm and dry; no lesions or  wounds.   DATA REVIEW  ECG: Normal sinus rhythm  ECHO: 07/23/2022: LVEF 20 to 25%, severely dilated, RV function moderately reduced, LA severely dilated, mild MR. 10/22/22: LVEF 25-30%  CATH: RHC/LHC (07/19/21): No angiographic evidence of CAD Elevated right and left heart pressures (LV: 130/22/39, RA 15, RV 82/13/20, PA 83/30 mean 49, PCWP 35).    ASSESSMENT & PLAN:  NYHA IIb, stage C nonischemic cardiomyopathy Etiology of HF: Nonischemic cardiomyopathy likely idiopathic dilated however cannot rule out infiltrative disease.  Plan for cardiac MRI tomorrow NYHA class / AHA Stage: NYHA IIb Volume status & Diuretics: Euvolemic, IVC 1.3 cm w/ rise in sCr and BUN; decrease lasix to '20mg'$  daily. Vasodilators: Increase entresto to 97/103 Beta-Blocker: Coreg 12.5 twice daily MRA: Spironolactone 25 Cardiometabolic: Farxiga 10 mg daily Devices therapies & Valvulopathies: Primary prevention ICD in place Advanced therapies: With slow uptitration of GDMT, James [name] has had significant improvement in exercise capacity, however, his LVEF remains severely reduced. CMR scheduled for next week to evaluate for infiltrative cardiomyopathies (sarcoid, etc). I am still concerned about his long term prognosis in the setting of persistently reduced LVEF. He will very likely require advance therapies. Plan for RHC and CPX.   2. CKD - sCr 1.55 today; will decrease lasix to '20mg'$  daily.   3. Aortic  root aneurysm - 5cm by echo in 10/23. Stable.   James [name] Advanced Heart Failure Mechanical Circulatory Support

## 2022-10-22 NOTE — H&P (View-Only) (Signed)
ADVANCED HEART FAILURE CLINIC NOTE  Referring Physician: Johnette Abraham, MD  Primary Care: Johnette Abraham, MD Primary Cardiologist:  HPI: James [name] is a 62 y.o. male with nonischemic cardiomyopathy s/p ICD, HTNm, T2DM, aortic root aneurysm presenting today to establish care. James [name] HF dates back to 2011 when he was diagnosed with nonischemic cardiomyopathy.  According to James [name] he came to the [facility] system in 2013 when he had a left heart cath with no coronary disease with a EF of 20%, he was started on low-dose GDMT at that time.  By 2013 his EF improved to 45%.  He followed yearly with Dr. Collier Salina admission until 2022 when he once again had a reduction in LVEF to 20 to 25% patient was started on Entresto With a repeat heart cath demonstrating no significant coronary disease.  For the past 1 year despite the addition of GDMT, patient has had progressively worsening exercise capacity with no improvement in LV function.  Referred to heart failure for further evaluation.  Interval hx:  Since his last appointment, no longer having issues with SOB, LE edema; he is able to ambulate further without difficulty; no PND; reports compliance with all HF meds.   Activity level/exercise tolerance: NYHA IIb Orthopnea:  Sleeps on 2 pillows Paroxysmal noctural dyspnea: No Chest pain/pressure: No Orthostatic lightheadedness: No Palpitations: No Lower extremity edema: No Presyncope/syncope: No Cough: No  Past Medical History:  Diagnosis Date   CKD (chronic kidney disease) stage 2, GFR 60-89 ml/min    Diabetes mellitus type II, non insulin dependent (HCC)    Dilated aortic root (HCC)    Hypercholesteremia    Hypertension    NICM (nonischemic cardiomyopathy) (HCC)    NSVT (nonsustained ventricular tachycardia) (HCC)    Obstructive sleep apnea    was positive but never given a CPAP   Pulmonary hypertension (HCC)    PVC's (premature ventricular contractions)    Tobacco abuse      Current Outpatient Medications  Medication Sig Dispense Refill   acetaminophen (TYLENOL) 500 MG tablet Take 1,000 mg by mouth every 6 (six) hours as needed for moderate pain.     albuterol (VENTOLIN HFA) 108 (90 Base) MCG/ACT inhaler Inhale 1-2 puffs into the lungs every 6 (six) hours as needed for wheezing or shortness of breath. 18 g 0   aspirin 81 MG chewable tablet Chew 81 mg by mouth in the morning.     carvedilol (COREG) 12.5 MG tablet TAKE 1 TABLET(12.5 MG) BY MOUTH TWICE DAILY 60 tablet 2   FARXIGA 10 MG TABS tablet TAKE 1 TABLET(10 MG) BY MOUTH DAILY BEFORE BREAKFAST 30 tablet 11   furosemide (LASIX) 40 MG tablet Take 40 mg by mouth as directed. Take 40 mg qod alt with 20 mg qod     Meclizine HCl 25 MG CHEW Chew 1 tablet by mouth 3 (three) times daily as needed.     metFORMIN (GLUCOPHAGE) 500 MG tablet Take 500 mg by mouth 2 (two) times daily with a meal.      sacubitril-valsartan (ENTRESTO) 97-103 MG Take 1 tablet by mouth 2 (two) times daily. 60 tablet 11   spironolactone (ALDACTONE) 25 MG tablet TAKE 1 TABLET(25 MG) BY MOUTH DAILY 90 tablet 3   No current facility-administered medications for this encounter.    No Known Allergies    Social History   Socioeconomic History   Marital status: Married    Spouse name: Nabeel Gladson   Number of children:  4   Years of education: Not on file   Highest education level: Associate degree: occupational, Hotel manager, or vocational program  Occupational History   Occupation: supervisor    Comment: [employer]  Tobacco Use   Smoking status: Former    Packs/day: 0.50    Years: 32.00    Total pack years: 16.00    Types: Cigarettes    Start date: 10/15/1982    Quit date: 07/18/2021    Years since quitting: 1.2   Smokeless tobacco: Never   Tobacco comments:    3 cigaretts a day  Vaping Use   Vaping Use: Never used  Substance and Sexual Activity   Alcohol use: No    Alcohol/week: 0.0 standard drinks of alcohol   Drug use:  No   Sexual activity: Yes    Birth control/protection: None  Other Topics Concern   Not on file  Social History Narrative   Divorced   No regular exercise   Social Determinants of Health   Financial Resource Strain: Low Risk  (07/21/2021)   Overall Financial Resource Strain (CARDIA)    Difficulty of Paying Living Expenses: Not very hard  Food Insecurity: No Food Insecurity (07/22/2022)   Hunger Vital Sign    Worried About Running Out of Food in the Last Year: Never true    Ran Out of Food in the Last Year: Never true  Transportation Needs: No Transportation Needs (07/22/2022)   PRAPARE - Hydrologist (Medical): No    Lack of Transportation (Non-Medical): No  Physical Activity: Not on file  Stress: Not on file  Social Connections: Not on file  Intimate Partner Violence: Not At Risk (07/22/2022)   Humiliation, Afraid, Rape, and Kick questionnaire    Fear of Current or Ex-Partner: No    Emotionally Abused: No    Physically Abused: No    Sexually Abused: No      Family History  Problem Relation Age of Onset   Heart attack Father    Hypertension Father    Iron deficiency Father    Coronary artery disease Mother    Heart disease Maternal Grandfather        Also paternal grandfather   Cardiomyopathy Brother        No significant coronary disease by coronary angiography   Thyroid disease Sister    Sudden death Other    Colon cancer Neg Hx     PHYSICAL EXAM: Vitals:   10/22/22 1005  BP: 121/89  Pulse: 64  SpO2: 98%    GENERAL: Well nourished, well developed, and in no apparent distress at rest.  HEENT: Negative for arcus senilis or xanthelasma. There is no scleral icterus.  The mucous membranes are pink and moist.   NECK: Supple, No masses. Normal carotid upstrokes without bruits. No masses or thyromegaly.    CHEST: There are no chest wall deformities. There is no chest wall tenderness. Respirations are unlabored.  Lungs-CTA  bilaterally CARDIAC:  JVP: <7cm          Normal S1, S2  Normal rate with regular rhythm. No murmurs, rubs or gallops.  Pulses are 2+ and symmetrical in upper and lower extremities.  No edema.  ABDOMEN: soft, nontender EXTREMITIES: warm with no edema LYMPHATIC: No axillary or supraclavicular lymphadenopathy.  NEUROLOGIC: Patient is oriented x3 with no focal or lateralizing neurologic deficits.  PSYCH: Patients affect is appropriate, there is no evidence of anxiety or depression.  SKIN: Warm and dry; no lesions or  wounds.   DATA REVIEW  ECG: Normal sinus rhythm  ECHO: 07/23/2022: LVEF 20 to 25%, severely dilated, RV function moderately reduced, LA severely dilated, mild MR. 10/22/22: LVEF 25-30%  CATH: RHC/LHC (07/19/21): No angiographic evidence of CAD Elevated right and left heart pressures (LV: 130/22/39, RA 15, RV 82/13/20, PA 83/30 mean 49, PCWP 35).    ASSESSMENT & PLAN:  NYHA IIb, stage C nonischemic cardiomyopathy Etiology of HF: Nonischemic cardiomyopathy likely idiopathic dilated however cannot rule out infiltrative disease.  Plan for cardiac MRI tomorrow NYHA class / AHA Stage: NYHA IIb Volume status & Diuretics: Euvolemic, IVC 1.3 cm w/ rise in sCr and BUN; decrease lasix to '20mg'$  daily. Vasodilators: Increase entresto to 97/103 Beta-Blocker: Coreg 12.5 twice daily MRA: Spironolactone 25 Cardiometabolic: Farxiga 10 mg daily Devices therapies & Valvulopathies: Primary prevention ICD in place Advanced therapies: With slow uptitration of GDMT, Mr. Arif has had significant improvement in exercise capacity, however, his LVEF remains severely reduced. CMR scheduled for next week to evaluate for infiltrative cardiomyopathies (sarcoid, etc). I am still concerned about his long term prognosis in the setting of persistently reduced LVEF. He will very likely require advance therapies. Plan for RHC and CPX.   2. CKD - sCr 1.55 today; will decrease lasix to '20mg'$  daily.   3. Aortic  root aneurysm - 5cm by echo in 10/23. Stable.   Dam Ashraf Advanced Heart Failure Mechanical Circulatory Support

## 2022-10-29 ENCOUNTER — Telehealth (HOSPITAL_COMMUNITY): Payer: Self-pay | Admitting: Emergency Medicine

## 2022-10-29 ENCOUNTER — Ambulatory Visit: Payer: BC Managed Care – PPO | Attending: Internal Medicine | Admitting: Pulmonary Disease

## 2022-10-29 ENCOUNTER — Telehealth (HOSPITAL_COMMUNITY): Payer: Self-pay | Admitting: *Deleted

## 2022-10-29 DIAGNOSIS — G4733 Obstructive sleep apnea (adult) (pediatric): Secondary | ICD-10-CM | POA: Diagnosis not present

## 2022-10-29 NOTE — Telephone Encounter (Signed)
Attempted to call patient regarding upcoming cardiac MRI appointment. Patient voicemail not set up to leave a message. Called wife's number and spoke with her. Left name and call back number with wife for patient to call back.  Gordy Clement RN Navigator Cardiac Imaging [facility] [phone] Office [phone] Cell

## 2022-10-29 NOTE — Telephone Encounter (Signed)
Reaching out to patient to offer assistance regarding upcoming cardiac imaging study; pt verbalizes understanding of appt date/time, parking situation and where to check in, pre-test NPO status and medications ordered, and verified current allergies; name and call back number provided for further questions should they arise Marchia Bond RN Navigator Cardiac Imaging [facility] [phone] office [phone] cell  Arrival [address] Holding lasix, spironolactone ICD - BIOTRONIK [id number] Acticor 7 VR-T DX ICD   RV leadNelma Rothman [id number] Plexa ProMRI S DX 65/15 Denies claustro

## 2022-10-30 ENCOUNTER — Telehealth (HOSPITAL_COMMUNITY): Payer: Self-pay | Admitting: *Deleted

## 2022-10-30 ENCOUNTER — Ambulatory Visit (HOSPITAL_COMMUNITY): Admission: RE | Admit: 2022-10-30 | Payer: BC Managed Care – PPO | Source: Ambulatory Visit

## 2022-10-30 NOTE — Telephone Encounter (Signed)
Patient calling to cancel his cardiac MRI saying that he is not feeling well.  I informed him that it may take a few months before he can get back on the schedule. He verbalized understanding.  Gordy Clement RN Navigator Cardiac Imaging [facility] Services [phone] Office [phone] Cell

## 2022-11-01 ENCOUNTER — Ambulatory Visit: Payer: BC Managed Care – PPO | Admitting: Internal Medicine

## 2022-11-05 ENCOUNTER — Other Ambulatory Visit: Payer: Self-pay | Admitting: Internal Medicine

## 2022-11-05 ENCOUNTER — Encounter: Payer: Self-pay | Admitting: Internal Medicine

## 2022-11-05 DIAGNOSIS — G4733 Obstructive sleep apnea (adult) (pediatric): Secondary | ICD-10-CM

## 2022-11-05 NOTE — Procedures (Signed)
Patient Name: James [name], James [name] Date: 10/29/2022 Gender: Male D.O.B: 02/28/1961 Age (years): 61 Referring Provider: Johnette Abraham Height (inches): 78 Interpreting Physician: Kara Mead MD, ABSM Weight (lbs): 243 RPSGT: Rosebud Poles BMI: 28 MRN: [id number] Neck Size: 17.00 <br> <br> CLINICAL INFORMATION Sleep Study Type: NPSG    Indication for sleep study: snoring, obesity, non refreshing sleep    Epworth Sleepiness Score: 9    SLEEP STUDY TECHNIQUE As per the AASM Manual for the Scoring of Sleep and Associated Events v2.3 (April 2016) with a hypopnea requiring 4% desaturations.  The channels recorded and monitored were frontal, central and occipital EEG, electrooculogram (EOG), submentalis EMG (chin), nasal and oral airflow, thoracic and abdominal wall motion, anterior tibialis EMG, snore microphone, electrocardiogram, and pulse oximetry.  MEDICATIONS Medications self-administered by patient taken the night of the study : N/A  SLEEP ARCHITECTURE The study was initiated at 10:04:24 PM and ended at 4:36:16 AM.  Sleep onset time was 41.2 minutes and the sleep efficiency was 74.6%. The total sleep time was 292.5 minutes.  Stage REM latency was 25.5 minutes.  The patient spent 13.68% of the night in stage N1 sleep, 65.81% in stage N2 sleep, 7.01% in stage N3 and 13.5% in REM.  Alpha intrusion was absent.  Supine sleep was 66.84%.  RESPIRATORY PARAMETERS The overall apnea/hypopnea index (AHI) was 30.6 per hour. There were 0 total apneas, including 0 obstructive, 0 central and 0 mixed apneas. There were 149 hypopneas and 0 RERAs.  The AHI during Stage REM sleep was 21.3 per hour.  AHI while supine was 44.5 per hour.  The mean oxygen saturation was 92.09%. The minimum SpO2 during sleep was 83.00%.  moderate snoring was noted during this study.  CARDIAC DATA The 2 lead EKG demonstrated sinus rhythm. The mean heart rate was 59.39 beats per minute. Other EKG  findings include: PVCs.   LEG MOVEMENT DATA The total PLMS were 0 with a resulting PLMS index of 0.00. Associated arousal with leg movement index was 0.0 .  IMPRESSIONS - Moderate -severe obstructive sleep apnea occurred during this study (AHI = 30.6/h). Events were predominantly noted during supine sleep. - Not enough events noted in the first half of the study, hence split titration was not performed - Mild oxygen desaturation was noted during this study (Min O2 = 83.00%). - The patient snored with moderate snoring volume. - EKG findings include PVCs. - Clinically significant periodic limb movements did not occur during sleep. No significant associated arousals.   DIAGNOSIS - Obstructive Sleep Apnea (G47.33)   RECOMMENDATIONS - Therapeutic CPAP titration to determine optimal pressure required to alleviate sleep disordered breathing. Alternatively, autoCPAP can be tried. - Positional therapy avoiding supine position during sleep. - Avoid alcohol, sedatives and other CNS depressants that may worsen sleep apnea and disrupt normal sleep architecture. - Sleep hygiene should be reviewed to assess factors that may improve sleep quality. - Weight management and regular exercise should be initiated or continued if appropriate.    Kara Mead MD Board Certified in [place]

## 2022-11-07 ENCOUNTER — Other Ambulatory Visit (HOSPITAL_COMMUNITY): Payer: BC Managed Care – PPO

## 2022-11-12 ENCOUNTER — Telehealth (HOSPITAL_COMMUNITY): Payer: Self-pay

## 2022-11-12 DIAGNOSIS — L851 Acquired keratosis [keratoderma] palmaris et plantaris: Secondary | ICD-10-CM | POA: Diagnosis not present

## 2022-11-12 DIAGNOSIS — E1151 Type 2 diabetes mellitus with diabetic peripheral angiopathy without gangrene: Secondary | ICD-10-CM | POA: Diagnosis not present

## 2022-11-12 DIAGNOSIS — M79674 Pain in right toe(s): Secondary | ICD-10-CM | POA: Diagnosis not present

## 2022-11-12 DIAGNOSIS — B351 Tinea unguium: Secondary | ICD-10-CM | POA: Diagnosis not present

## 2022-11-12 NOTE — Telephone Encounter (Signed)
Spoke to patient about procedure scheduled for tomorrow.Aware of time,nothing to eat or drink after midnight. Aware of holding Farxiga,lasix and spironolactone. Has transportation to and from procedure.

## 2022-11-13 ENCOUNTER — Ambulatory Visit (HOSPITAL_COMMUNITY)
Admission: RE | Admit: 2022-11-13 | Discharge: 2022-11-13 | Disposition: A | Discharge status: Home / Self Care | Payer: BC Managed Care – PPO | Attending: Cardiology | Admitting: Cardiology

## 2022-11-13 ENCOUNTER — Encounter (HOSPITAL_COMMUNITY)
Admission: RE | Disposition: A | Discharge status: Home / Self Care | Payer: Self-pay | Source: Home / Self Care | Attending: Cardiology

## 2022-11-13 ENCOUNTER — Other Ambulatory Visit: Payer: Self-pay

## 2022-11-13 ENCOUNTER — Encounter (HOSPITAL_COMMUNITY): Payer: Self-pay | Admitting: Cardiology

## 2022-11-13 DIAGNOSIS — N182 Chronic kidney disease, stage 2 (mild): Secondary | ICD-10-CM | POA: Insufficient documentation

## 2022-11-13 DIAGNOSIS — Z7984 Long term (current) use of oral hypoglycemic drugs: Secondary | ICD-10-CM | POA: Insufficient documentation

## 2022-11-13 DIAGNOSIS — Z87891 Personal history of nicotine dependence: Secondary | ICD-10-CM | POA: Insufficient documentation

## 2022-11-13 DIAGNOSIS — E1122 Type 2 diabetes mellitus with diabetic chronic kidney disease: Secondary | ICD-10-CM | POA: Diagnosis not present

## 2022-11-13 DIAGNOSIS — Z79899 Other long term (current) drug therapy: Secondary | ICD-10-CM | POA: Insufficient documentation

## 2022-11-13 DIAGNOSIS — Q2543 Congenital aneurysm of aorta: Secondary | ICD-10-CM | POA: Diagnosis not present

## 2022-11-13 DIAGNOSIS — I428 Other cardiomyopathies: Secondary | ICD-10-CM | POA: Diagnosis not present

## 2022-11-13 DIAGNOSIS — I5022 Chronic systolic (congestive) heart failure: Secondary | ICD-10-CM

## 2022-11-13 DIAGNOSIS — I130 Hypertensive heart and chronic kidney disease with heart failure and stage 1 through stage 4 chronic kidney disease, or unspecified chronic kidney disease: Secondary | ICD-10-CM | POA: Insufficient documentation

## 2022-11-13 DIAGNOSIS — I509 Heart failure, unspecified: Secondary | ICD-10-CM

## 2022-11-13 HISTORY — PX: RIGHT HEART CATH: CATH118263

## 2022-11-13 LAB — CBC
HCT: 53.6 % — ABNORMAL HIGH (ref 39.0–52.0)
Hemoglobin: 18.6 g/dL — ABNORMAL HIGH (ref 13.0–17.0)
MCH: 32.4 pg (ref 26.0–34.0)
MCHC: 34.7 g/dL (ref 30.0–36.0)
MCV: 93.4 fL (ref 80.0–100.0)
Platelets: 154 10*3/uL (ref 150–400)
RBC: 5.74 MIL/uL (ref 4.22–5.81)
RDW: 13.1 % (ref 11.5–15.5)
WBC: 7 10*3/uL (ref 4.0–10.5)
nRBC: 0 % (ref 0.0–0.2)

## 2022-11-13 LAB — POCT I-STAT EG7
Acid-Base Excess: 0 mmol/L (ref 0.0–2.0)
Acid-Base Excess: 0 mmol/L (ref 0.0–2.0)
Bicarbonate: 26.1 mmol/L (ref 20.0–28.0)
Bicarbonate: 25.7 mmol/L (ref 20.0–28.0)
Calcium, Ion: 1.19 mmol/L (ref 1.15–1.40)
Calcium, Ion: 1.2 mmol/L (ref 1.15–1.40)
HCT: 54 % — ABNORMAL HIGH (ref 39.0–52.0)
HCT: 55 % — ABNORMAL HIGH (ref 39.0–52.0)
Hemoglobin: 18.4 g/dL — ABNORMAL HIGH (ref 13.0–17.0)
Hemoglobin: 18.7 g/dL — ABNORMAL HIGH (ref 13.0–17.0)
O2 Saturation: 63 %
O2 Saturation: 64 %
Potassium: 4.3 mmol/L (ref 3.5–5.1)
Potassium: 4.4 mmol/L (ref 3.5–5.1)
Sodium: 140 mmol/L (ref 135–145)
Sodium: 139 mmol/L (ref 135–145)
TCO2: 28 mmol/L (ref 22–32)
TCO2: 27 mmol/L (ref 22–32)
pCO2, Ven: 45.7 mmHg (ref 44–60)
pCO2, Ven: 45.2 mmHg (ref 44–60)
pH, Ven: 7.366 (ref 7.25–7.43)
pH, Ven: 7.363 (ref 7.25–7.43)
pO2, Ven: 34 mmHg (ref 32–45)
pO2, Ven: 34 mmHg (ref 32–45)

## 2022-11-13 LAB — GLUCOSE, CAPILLARY: Glucose-Capillary: 119 mg/dL — ABNORMAL HIGH (ref 70–99)

## 2022-11-13 SURGERY — RIGHT HEART CATH
Anesthesia: LOCAL

## 2022-11-13 MED ORDER — SODIUM CHLORIDE 0.9% FLUSH: 3.0000 mL | INTRAVENOUS | Status: DC | PRN

## 2022-11-13 MED ORDER — HEPARIN (PORCINE) IN NACL 1000-0.9 UT/500ML-% IV SOLN
INTRAVENOUS | Status: DC | PRN
  Administered 2022-11-13: 500 mL

## 2022-11-13 MED ORDER — SODIUM CHLORIDE 0.9% FLUSH: 3.0000 mL | Freq: Two times a day (BID) | INTRAVENOUS | Status: DC

## 2022-11-13 MED ORDER — SODIUM CHLORIDE 0.9 % IV SOLN: INTRAVENOUS | Status: DC

## 2022-11-13 MED ORDER — SODIUM CHLORIDE 0.9 % IV SOLN: 250.0000 mL | INTRAVENOUS | Status: DC | PRN

## 2022-11-13 MED ORDER — LIDOCAINE HCL (PF) 1 % IJ SOLN
INTRAMUSCULAR | Status: DC | PRN
  Administered 2022-11-13: 2 mL

## 2022-11-13 MED ORDER — LIDOCAINE HCL (PF) 1 % IJ SOLN
INTRAMUSCULAR | Status: AC
  Filled 2022-11-13: qty 30

## 2022-11-13 MED ORDER — HEPARIN (PORCINE) IN NACL 1000-0.9 UT/500ML-% IV SOLN
INTRAVENOUS | Status: AC
  Filled 2022-11-13: qty 500

## 2022-11-13 SURGICAL SUPPLY — 6 items
CATH SWAN GANZ 7F STRAIGHT (CATHETERS) IMPLANT
GLIDESHEATH SLENDER 7FR .021G (SHEATH) IMPLANT
PACK CARDIAC CATHETERIZATION (CUSTOM PROCEDURE TRAY) ×1 IMPLANT
TRANSDUCER W/STOPCOCK (MISCELLANEOUS) ×1 IMPLANT
TUBING ART PRESS 72  MALE/FEM (TUBING) ×1
TUBING ART PRESS 72 MALE/FEM (TUBING) IMPLANT

## 2022-11-13 NOTE — Discharge Instructions (Signed)

## 2022-11-13 NOTE — Interval H&P Note (Signed)
History and Physical Interval Note:  11/13/2022 12:47 PM  James [name]  has presented today for surgery, with the diagnosis of heart failure.  The various methods of treatment have been discussed with the patient and family. After consideration of risks, benefits and other options for treatment, the patient has consented to  Procedure(s): RIGHT HEART CATH (N/A) as a surgical intervention.  The patient's history has been reviewed, patient examined, no change in status, stable for surgery.  I have reviewed the patient's chart and labs.  Questions were answered to the patient's satisfaction.     Miloh Alcocer

## 2022-11-14 ENCOUNTER — Telehealth (HOSPITAL_COMMUNITY): Payer: Self-pay | Admitting: Vascular Surgery

## 2022-11-14 NOTE — Telephone Encounter (Signed)
Called pt o make CPX appt, pt does not have VM set up will try back later

## 2022-11-16 ENCOUNTER — Ambulatory Visit (INDEPENDENT_AMBULATORY_CARE_PROVIDER_SITE_OTHER): Payer: BC Managed Care – PPO | Admitting: Internal Medicine

## 2022-11-16 ENCOUNTER — Encounter: Payer: Self-pay | Admitting: Internal Medicine

## 2022-11-16 VITALS — BP 107/68 | HR 71 | Ht 78.0 in | Wt 251.4 lb

## 2022-11-16 DIAGNOSIS — E119 Type 2 diabetes mellitus without complications: Secondary | ICD-10-CM

## 2022-11-16 DIAGNOSIS — Z720 Tobacco use: Secondary | ICD-10-CM

## 2022-11-16 DIAGNOSIS — I5020 Unspecified systolic (congestive) heart failure: Secondary | ICD-10-CM

## 2022-11-16 DIAGNOSIS — Z2821 Immunization not carried out because of patient refusal: Secondary | ICD-10-CM

## 2022-11-16 DIAGNOSIS — I428 Other cardiomyopathies: Secondary | ICD-10-CM | POA: Diagnosis not present

## 2022-11-16 DIAGNOSIS — G4733 Obstructive sleep apnea (adult) (pediatric): Secondary | ICD-10-CM

## 2022-11-16 MED ORDER — DAPAGLIFLOZIN PROPANEDIOL 5 MG PO TABS: 5.0000 mg | ORAL_TABLET | Freq: Every day | ORAL | 5 refills | Status: DC

## 2022-11-16 NOTE — Patient Instructions (Signed)
It was a pleasure to see you today.  Thank you for giving [name] the opportunity to be involved in your care.  Below is a brief recap of your visit and next steps.  We will plan to see you again in 3 months.  Summary Decrease Farxiga to 5 mg daily We will plan for follow up in 3 months and plan to repeat labs at that time

## 2022-11-16 NOTE — Progress Notes (Unsigned)
Established Patient Office Visit  Subjective   Patient ID: James [name], male    DOB: 29-Aug-1961  Age: 62 y.o. MRN: [id number]  Chief Complaint  Patient presents with   Sleep Apnea    Follow up, has not got machine yet   James [name] returns to care today.  He was last seen by me on 07/30/22 as a new patient presenting to establish care.  No medication changes were made and 78-monthfollow-up was arranged.  In the interim he has been seen by cardiology/advanced heart failure clinic for follow-up.  He underwent RHC on 1/30, which demonstrated increased PA pressures concerning for pHTN type II.  Today Mr. BRipbergerreports that he has recently experienced dizziness with taking FWilder Gladeand has stopped taking the medication.  He is otherwise asymptomatic and has no acute concerns to discuss today.  Past Medical History:  Diagnosis Date   CKD (chronic kidney disease) stage 2, GFR 60-89 ml/min    Diabetes mellitus type II, non insulin dependent (HCC)    Dilated aortic root (HCC)    Hypercholesteremia    Hypertension    NICM (nonischemic cardiomyopathy) (HCC)    NSVT (nonsustained ventricular tachycardia) (HCC)    Obstructive sleep apnea    was positive but never given a CPAP   Pulmonary [place] ([place]    PVC's (premature ventricular contractions)    Tobacco abuse    Past Surgical History:  Procedure Laterality Date   CARDIAC CATHETERIZATION  2002   COLONOSCOPY N/A 03/22/2014   Procedure: COLONOSCOPY;  Surgeon: SDanie Binder MD;  [facility];  Service: Endoscopy;  Laterality: N/A;  11:15 AM   ICD IMPLANT N/A 08/21/2021   Procedure: ICD IMPLANT;  Surgeon: TEvans Lance MD;  [facility];  Service: Cardiovascular;  Laterality: N/A;   INGUINAL HERNIA REPAIR Right 06/24/2017   Procedure: HERNIA REPAIR INGUINAL ADULT WITH MESH;  Surgeon: JAviva Signs MD;  Location: [facility];  Service: General;  Laterality: Right;   RIGHT HEART CATH N/A 11/13/2022   Procedure:  RIGHT HEART CATH;  Surgeon: SHebert Soho DO;  [facility];  Service: Cardiovascular;  Laterality: N/A;   RIGHT/LEFT HEART CATH AND CORONARY ANGIOGRAPHY N/A 07/19/2021   Procedure: RIGHT/LEFT HEART CATH AND CORONARY ANGIOGRAPHY;  Surgeon: MBurnell Blanks MD;  [facility];  Service: Cardiovascular;  Laterality: N/A;   Social History   Tobacco Use   Smoking status: Former    Packs/day: 0.50    Years: 32.00    Total pack years: 16.00    Types: Cigarettes    Start date: 10/15/1982    Quit date: 07/18/2021    Years since quitting: 1.3   Smokeless tobacco: Never   Tobacco comments:    3 cigaretts a day  Vaping Use   Vaping Use: Never used  Substance Use Topics   Alcohol use: No    Alcohol/week: 0.0 standard drinks of alcohol   Drug use: No   Family History  Problem Relation Age of Onset   Heart attack Father    Hypertension Father    Iron deficiency Father    Coronary artery disease Mother    Heart disease Maternal Grandfather        Also paternal grandfather   Cardiomyopathy Brother        No significant coronary disease by coronary angiography   Thyroid disease Sister    Sudden death Other    Colon cancer Neg Hx  No Known Allergies  Review of Systems  Neurological:  Positive for dizziness (Attributed to taking [place]).  All other systems reviewed and are negative.    Objective:     BP 107/68   Pulse 71   Ht '6\' 6"'$  (1.981 m)   Wt 251 lb 6.4 oz (114 kg)   SpO2 97%   BMI 29.05 kg/m  BP Readings from Last 3 Encounters:  11/16/22 107/68  11/13/22 123/74  10/22/22 121/89   Physical Exam Vitals reviewed.  Constitutional:      General: He is not in acute distress.    Appearance: Normal appearance. He is not ill-appearing.  HENT:     Head: Normocephalic and atraumatic.     Right Ear: External ear normal.     Left Ear: External ear normal.     Nose: Nose normal. No congestion or rhinorrhea.     Mouth/Throat:      Mouth: Mucous membranes are moist.     Pharynx: Oropharynx is clear.  Eyes:     General: No scleral icterus.    Extraocular Movements: Extraocular movements intact.     Conjunctiva/sclera: Conjunctivae normal.     Pupils: Pupils are equal, round, and reactive to light.  Cardiovascular:     Rate and Rhythm: Normal rate and regular rhythm.     Pulses: Normal pulses.     Heart sounds: Normal heart sounds. No murmur heard. Pulmonary:     Effort: Pulmonary effort is normal.     Breath sounds: Normal breath sounds. No wheezing, rhonchi or rales.  Abdominal:     General: Abdomen is flat. Bowel sounds are normal. There is no distension.     Palpations: Abdomen is soft.     Tenderness: There is no abdominal tenderness.  Musculoskeletal:        General: No swelling or deformity. Normal range of motion.     Cervical back: Normal range of motion.  Skin:    General: Skin is warm and dry.     Capillary Refill: Capillary refill takes less than 2 seconds.  Neurological:     General: No focal deficit present.     Mental Status: He is alert and oriented to person, place, and time.     Motor: No weakness.  Psychiatric:        Mood and Affect: Mood normal.        Behavior: Behavior normal.        Thought Content: Thought content normal.   Last CBC Lab Results  Component Value Date   WBC 7.0 11/13/2022   HGB 18.4 (H) 11/13/2022   HGB 18.7 (H) 11/13/2022   HCT 54.0 (H) 11/13/2022   HCT 55.0 (H) 11/13/2022   MCV 93.4 11/13/2022   MCH 32.4 11/13/2022   RDW 13.1 11/13/2022   PLT 154 24/40/1027   Last metabolic panel Lab Results  Component Value Date   GLUCOSE 124 (H) 10/22/2022   NA 140 11/13/2022   NA 139 11/13/2022   K 4.3 11/13/2022   K 4.4 11/13/2022   CL 106 10/22/2022   CO2 22 10/22/2022   BUN 32 (H) 10/22/2022   CREATININE 1.55 (H) 10/22/2022   GFRNONAA 51 (L) 10/22/2022   CALCIUM 8.8 (L) 10/22/2022   PROT 6.7 07/30/2022   ALBUMIN 4.1 07/30/2022   LABGLOB 2.6 07/30/2022    AGRATIO 1.6 07/30/2022   BILITOT 0.8 07/30/2022   ALKPHOS 78 07/30/2022   AST 21 07/30/2022   ALT 28 07/30/2022   ANIONGAP 8  10/22/2022   Last lipids Lab Results  Component Value Date   CHOL 190 10/31/2012   HDL 38 (L) 10/31/2012   LDLCALC 123 (H) 10/31/2012   TRIG 143 10/31/2012   CHOLHDL 5.0 10/31/2012   Last hemoglobin A1c Lab Results  Component Value Date   HGBA1C 6.3 (H) 07/22/2022   Last thyroid functions Lab Results  Component Value Date   TSH 0.580 08/24/2022     Assessment & Plan:   Problem List Items Addressed This Visit       HFrEF (heart failure with reduced ejection fraction) (HCC) - Primary (Chronic)    Closely followed by the advanced heart failure clinic.  Repeat echo 1/8 shows EF < 20%.  He is currently prescribed carvedilol 12.5 mg twice daily, Farxiga 10 mg daily, Lasix 40 mg daily, Entresto 97-103 mg twice daily, and spironolactone 25 mg daily.  He endorses recent dizziness with Wilder Glade and states that he has stopped taking the medication.  He underwent right heart catheterization on 1/30, which demonstrated elevated PA pressures concerning for type II pulmonary hypertension. -He is agreeable with resuming Farxiga at a decreased dose.  I have prescribed Farxiga 5 mg daily today -Cardiology follow-up scheduled for next week (2/9).      Obstructive sleep apnea    PSG from 1/15 shows moderate-severe OSA.  He has been referred to sleep medicine for coordination of PAP titration.      Diabetes mellitus type 2 in nonobese (HCC)    A1c 6.3 in October.  He is currently prescribed metformin 500 mg twice daily and Farxiga 10 mg daily. -Decrease Farxiga to 5 mg daily.  Continue metformin 500 mg twice daily -DM related preventative care items are up-to-date      Tobacco abuse    Reports today that he is currently down to 1 cigarette/day.  Complete cessation was strongly encouraged.      Return in about 3 months (around 02/14/2023).    Johnette Abraham,  MD

## 2022-11-19 ENCOUNTER — Ambulatory Visit: Payer: BC Managed Care – PPO

## 2022-11-19 DIAGNOSIS — I428 Other cardiomyopathies: Secondary | ICD-10-CM

## 2022-11-19 DIAGNOSIS — I5022 Chronic systolic (congestive) heart failure: Secondary | ICD-10-CM

## 2022-11-20 ENCOUNTER — Telehealth: Payer: Self-pay

## 2022-11-20 LAB — CUP PACEART REMOTE DEVICE CHECK
Date Time Interrogation Session: 20240205095527
Implantable Lead Connection Status: 753985
Implantable Lead Implant Date: 20221107
Implantable Lead Location: 753860
Implantable Lead Model: 436909
Implantable Lead Serial Number: 81476621
Implantable Pulse Generator Implant Date: 20221107
Pulse Gen Model: 429525
Pulse Gen Serial Number: 84864435

## 2022-11-20 NOTE — Telephone Encounter (Signed)
Scheduled remote reviewed. Normal device function.   13 NSVT, no therapy 11 atrial monitoring episodes, available EGM's show AF/AFL longest duration 48mn, mean HR 138, burden 0%, ASA only - route to triage Next remote 91 days. LA   Patient experienced new atrial episode, appearing as AF/AFL on 10/25/22.  No other atrial events recorded since. Not on an [place] ASA only.  Short episodes only on 10/25/22 for total of 33 minutes.  He has appointment next week with Dr. TLovena Leon 11/27/22. Can review at appointment. FYI to Dr. TLovena Le   Episodes recorded:       33MINUTE EPISODE.

## 2022-11-20 NOTE — Assessment & Plan Note (Signed)
A1c 6.3 in October.  He is currently prescribed metformin 500 mg twice daily and Farxiga 10 mg daily. -Decrease Farxiga to 5 mg daily.  Continue metformin 500 mg twice daily -DM related preventative care items are up-to-date

## 2022-11-20 NOTE — Telephone Encounter (Signed)
Continue monitoring.

## 2022-11-20 NOTE — Assessment & Plan Note (Signed)
PSG from 1/15 shows moderate-severe OSA.  He has been referred to sleep medicine for coordination of PAP titration.

## 2022-11-20 NOTE — Assessment & Plan Note (Signed)
Reports today that he is currently down to 1 cigarette/day.  Complete cessation was strongly encouraged.

## 2022-11-20 NOTE — Assessment & Plan Note (Signed)
Closely followed by the advanced heart failure clinic.  Repeat echo 1/8 shows EF < 20%.  He is currently prescribed carvedilol 12.5 mg twice daily, Farxiga 10 mg daily, Lasix 40 mg daily, Entresto 97-103 mg twice daily, and spironolactone 25 mg daily.  He endorses recent dizziness with Wilder Glade and states that he has stopped taking the medication.  He underwent right heart catheterization on 1/30, which demonstrated elevated PA pressures concerning for type II pulmonary hypertension. -He is agreeable with resuming Farxiga at a decreased dose.  I have prescribed Farxiga 5 mg daily today -Cardiology follow-up scheduled for next week (2/9).

## 2022-11-23 ENCOUNTER — Other Ambulatory Visit (HOSPITAL_COMMUNITY): Payer: Self-pay | Admitting: Unknown Physician Specialty

## 2022-11-23 ENCOUNTER — Telehealth (HOSPITAL_COMMUNITY): Payer: Self-pay

## 2022-11-23 ENCOUNTER — Ambulatory Visit (HOSPITAL_COMMUNITY)
Admission: RE | Admit: 2022-11-23 | Discharge: 2022-11-23 | Disposition: A | Discharge status: Home / Self Care | Payer: BC Managed Care – PPO | Source: Ambulatory Visit | Attending: Cardiology | Admitting: Cardiology

## 2022-11-23 ENCOUNTER — Other Ambulatory Visit (HOSPITAL_COMMUNITY): Payer: Self-pay

## 2022-11-23 VITALS — BP 103/84 | HR 68 | Ht 78.0 in | Wt 253.8 lb

## 2022-11-23 DIAGNOSIS — I428 Other cardiomyopathies: Secondary | ICD-10-CM

## 2022-11-23 LAB — COMPREHENSIVE METABOLIC PANEL
ALT: 19 U/L (ref 0–44)
AST: 19 U/L (ref 15–41)
Albumin: 3.9 g/dL (ref 3.5–5.0)
Alkaline Phosphatase: 66 U/L (ref 38–126)
Anion gap: 11 (ref 5–15)
BUN: 23 mg/dL (ref 8–23)
CO2: 23 mmol/L (ref 22–32)
Calcium: 9 mg/dL (ref 8.9–10.3)
Chloride: 101 mmol/L (ref 98–111)
Creatinine, Ser: 1.3 mg/dL — ABNORMAL HIGH (ref 0.61–1.24)
GFR, Estimated: >60 mL/min (ref >60)
Glucose, Bld: 118 mg/dL — ABNORMAL HIGH (ref 70–99)
Potassium: 4.1 mmol/L (ref 3.5–5.1)
Sodium: 135 mmol/L (ref 135–145)
Total Bilirubin: 1 mg/dL (ref 0.3–1.2)
Total Protein: 7 g/dL (ref 6.5–8.1)

## 2022-11-23 LAB — CBC
HCT: 53.5 % — ABNORMAL HIGH (ref 39.0–52.0)
Hemoglobin: 18.5 g/dL — ABNORMAL HIGH (ref 13.0–17.0)
MCH: 32.2 pg (ref 26.0–34.0)
MCHC: 34.6 g/dL (ref 30.0–36.0)
MCV: 93.2 fL (ref 80.0–100.0)
Platelets: 144 10*3/uL — ABNORMAL LOW (ref 150–400)
RBC: 5.74 MIL/uL (ref 4.22–5.81)
RDW: 13.2 % (ref 11.5–15.5)
WBC: 6.8 10*3/uL (ref 4.0–10.5)
nRBC: 0 % (ref 0.0–0.2)

## 2022-11-23 LAB — TSH: TSH: 1.852 u[IU]/mL (ref 0.350–4.500)

## 2022-11-23 LAB — ABO/RH: ABO/RH(D): O POS

## 2022-11-23 LAB — HEPATITIS B CORE ANTIBODY, TOTAL: Hep B Core Total Ab: NONREACTIVE

## 2022-11-23 LAB — LIPID PANEL
Cholesterol: 237 mg/dL — ABNORMAL HIGH (ref 0–200)
HDL: 49 mg/dL (ref >40)
LDL Cholesterol: 171 mg/dL — ABNORMAL HIGH (ref 0–99)
Total CHOL/HDL Ratio: 4.8 RATIO
Triglycerides: 86 mg/dL (ref <150)
VLDL: 17 mg/dL (ref 0–40)

## 2022-11-23 LAB — URIC ACID: Uric Acid, Serum: 6.2 mg/dL (ref 3.7–8.6)

## 2022-11-23 LAB — APTT: aPTT: 35 seconds (ref 24–36)

## 2022-11-23 LAB — HEPATITIS B SURFACE ANTIBODY,QUALITATIVE: Hep B S Ab: REACTIVE — AB

## 2022-11-23 LAB — HEPATITIS B SURFACE ANTIGEN: Hepatitis B Surface Ag: NONREACTIVE

## 2022-11-23 LAB — HEPATITIS C ANTIBODY: HCV Ab: NONREACTIVE

## 2022-11-23 LAB — PROTIME-INR
INR: 1.1 (ref 0.8–1.2)
Prothrombin Time: 13.9 seconds (ref 11.4–15.2)

## 2022-11-23 LAB — BRAIN NATRIURETIC PEPTIDE: B Natriuretic Peptide: 443.5 pg/mL — ABNORMAL HIGH (ref 0.0–100.0)

## 2022-11-23 LAB — HEMOGLOBIN A1C
Hgb A1c MFr Bld: 6.6 % — ABNORMAL HIGH (ref 4.8–5.6)
Mean Plasma Glucose: 142.72 mg/dL

## 2022-11-23 LAB — PREALBUMIN: Prealbumin: 28 mg/dL (ref 18–38)

## 2022-11-23 LAB — HIV ANTIBODY (ROUTINE TESTING W REFLEX): HIV Screen 4th Generation wRfx: NONREACTIVE

## 2022-11-23 LAB — MAGNESIUM: Magnesium: 2.3 mg/dL (ref 1.7–2.4)

## 2022-11-23 LAB — T4, FREE: Free T4: 0.97 ng/dL (ref 0.61–1.12)

## 2022-11-23 LAB — LACTATE DEHYDROGENASE: LDH: 188 U/L (ref 98–192)

## 2022-11-23 LAB — PSA: Prostatic Specific Antigen: 7 ng/mL — ABNORMAL HIGH (ref 0.00–4.00)

## 2022-11-23 MED ORDER — ATORVASTATIN CALCIUM 40 MG PO TABS: 40.0000 mg | ORAL_TABLET | Freq: Every day | ORAL | 3 refills | Status: DC

## 2022-11-23 NOTE — Patient Instructions (Signed)
No change in medications We will be scheduling all your LVAD evaluation test for the week of the 19th

## 2022-11-23 NOTE — Telephone Encounter (Signed)
Patient aware and verbalized understanding. Prescription sent in.

## 2022-11-23 NOTE — Progress Notes (Signed)
Patient presents for VAD consult in [facility] today alone. Recent hospitalization with initiation of VAD evaluation.     Vital Signs:  HR: 68 BP: 103/84 (91) SPO2: 99 %   Weight: 253.8 lb w/o eqt Last weight: 251 lb   Symptom YES NO DETAILS  Angina  x Activity:  Claudication  x How Far:  Syncope  x When:  Stroke  x   Orthopnea  x How many pillows:  PND  x How often:  CPAP x  How many hours:just diagnosed in process of getting machine  Pedal Edema  x   Abdominal Fullness  x   Nausea / Vomit  x   Diaphoresis  x When:  Shortness of Breath  x Activity:  Palpitations  x When:  ICD shock  x   Bleeding S/S  x   Tea-colored Urine  x   Hospitalizations  x   Emergency Room  x   Other MD x  2/2-PCP decrease farxiga to 6m d/t lightheadedness  Activity Working 40 hrs a week  Fluid   Diet       Device: Biotrionik Therapies: On Last check: 11/19/22              VAD evaluation consent reviewed and signed by MR [employer]  Initial VAD teaching completed with pt and caregiver.   VAD educational packet including "Understanding Your Options with Advanced Heart Failure", "[place] Patient Agreement for VAD Evaluation and Potential Implantation" consent, and [employer] "Heartmate 3 Left Ventricular Device (LVAD) Patient Guide", Heartmate 3 Left Ventricular Assist System Patient Education Program DVD", "[place] HM III Patient Education", "[place] Mechanical Circulatory Support Program", and "Decision Aids for Left Ventricular Assist Device" reviewed in detail and left at bedside for continued reference.   All questions answered regarding VAD implant, hospital stay, and what to expect when discharged home living with a heart pump. Pt identified his wife MHolland Commonsas his primary caregiver and his daughter as back-up if this therapy should be deemed appropriate for him.  Explained need for 24/7 care when pt is discharged home due to sternal precautions, adaptation to living on support, emotional  support, consistent and meticulous exit site care and management, medication adherence and high volume of follow up visits [facility]; both pt and caregiver verbalized understanding of above.   Explained that LVAD can be implanted for two indications in the setting of advanced left ventricular heart failure treatment:  Bridge to transplant - used for patients who cannot safely wait for heart transplant without this device.  Or    Destination therapy - used for patients until end of life or recovery of heart function.  Patient and caregiver(s) acknowledge that the indication at this point in time for LVAD therapy would be for DT due to current smoking.   Provided brief equipment overview discussion on the following:   a) mobile power unit b) system controller   c) universal bCharity fundraiser  d) battery clips   e) Batteries   f)  Perc lock   g) Percutaneous lead   Discussed:  a) changing power source on system controller from tethered (MPU) to untethered (battery) mode   b) changing power source on system controller from untethered (battery) to tethered (MPU) mode   c) how to monitor battery life both on the system controller and on each individual battery   d) changing batteries   Reviewed and supplied a copy of home inspection check list stressing  that only three pronged grounded power outlets can be used for VAD equipment. Mr Parziale confirmed home has electrical outlets that will support the equipment along with access working telephone.  Identified the following lifestyle modifications while living on MCS:    1. No driving for at least three months and then only if doctor gives permission to do so.   2. No tub baths while pump implanted, and shower only when doctor gives permission.   3. No swimming or submersion in water while implanted with pump.   4. No contact sports or engaging in jumping activities.   5. Always have a backup controller, charged spare  batteries, and battery clips nearby at all times in case of emergency.   6. Call the doctor or hospital contact person if any change in how the pump sounds, feels, or works.   7. Plan to sleep only when connected to the power module.   8. Do not sleep on your stomach.   9. Keep a backup system controller, charged batteries, battery clips, and flashlight near you during sleep in case of electrical power outage.   10. Exit site care including dressing changes, monitoring for infection, and importance of keeping percutaneous lead stabilized at all times.     Extended the option to have one of our current patients and caregiver(s) come to talk with them about living on support to assist with decision making.   Reviewed pictures of VAD drive line, site care, dressing changes, and drive line stabilization including securement attachment device and abdominal binder. Discussed with pt and family that they will be required to purchase dressing supplies as long as patient has the VAD in place.   He will also need to abide by sternal precautions with no lifting >10lbs, pushing, pulling and will need assistance with adapting to new life style with VAD equipment and care.   Intermacs patient survival statistics through September 2023 reviewed with patient and caregiver as follows:   The patient understands that from this discussion it does not mean that they will receive the device, but that depends on an extensive evaluation process. The patient is aware of the fact that if at anytime they want to stop the evaluation process they can.  All questions have been answered at this time and contact information was provided should they encounter any further questions.  They are both agreeable at this time to the evaluation process and will move forward.    All labs drawn today. We will be in touch with pt regarding evaluation.    Patient Instructions:  No change in medications We will be scheduling all your  LVAD evaluation test for the week of the 19th    Tanda Rockers, RN VAD Coordinator    Office: [phone] 24/7 Emergency VAD Pager: [phone]

## 2022-11-24 LAB — LUPUS ANTICOAGULANT PANEL
DRVVT: 27.2 s (ref 0.0–47.0)
PTT Lupus Anticoagulant: 40 s (ref 0.0–43.5)

## 2022-11-24 LAB — ANTITHROMBIN PANEL
AT III AG PPP IMM-ACNC: 89 % (ref 72–124)
Antithrombin Activity: 91 % (ref 75–135)

## 2022-11-25 LAB — T3, FREE: T3, Free: 3.4 pg/mL (ref 2.0–4.4)

## 2022-11-26 ENCOUNTER — Telehealth (HOSPITAL_COMMUNITY): Payer: Self-pay | Admitting: Unknown Physician Specialty

## 2022-11-26 ENCOUNTER — Encounter (HOSPITAL_COMMUNITY): Payer: Self-pay | Admitting: Unknown Physician Specialty

## 2022-11-26 NOTE — Progress Notes (Signed)
Referring Physician: Johnette Abraham, MD  Primary Care: Johnette Abraham, MD Primary Cardiologist: Johnsie Cancel CHF MD:  Daniel Nones  HPI: James [name] is a 62 y.o. male with nonischemic cardiomyopathy s/p ICD, HTNm, T2DM, aortic root aneurysm Diagnosis dates back to 2011. His AICD is followed by Dr Lovena Le. 07/23/22 Hospitalization and followed by CHF team. TTE 10/22/22 EF < 20% , mild RV dysfunction Aortic root 4.8 cm Right heart cath 11/13/22 showed PCWP 28 PA 74/31 mmHg with CI 1.6 L/m2.  He is being evaluated for VAD. Has cardiopulmonary stress test , cardiac MRI pending as well as orthopantogram and CT abdomen   He is active rabbit hunting and 4 wheeling Has a bunch of pre VAD appointments coming up 12/13/22 including visit with Dr Tenny Craw. Has worked at [employer] for 70 years Wife works Pensions consultant cigarettes   Needs more understanding of entire VAD process   Past Medical History:  Diagnosis Date   CKD (chronic kidney disease) stage 2, GFR 60-89 ml/min    Diabetes mellitus type II, non insulin dependent (HCC)    Dilated aortic root (HCC)    Hypercholesteremia    Hypertension    NICM (nonischemic cardiomyopathy) (HCC)    NSVT (nonsustained ventricular tachycardia) (HCC)    Obstructive sleep apnea    was positive but never given a CPAP   Pulmonary hypertension (HCC)    PVC's (premature ventricular contractions)    Tobacco abuse     Current Outpatient Medications  Medication Sig Dispense Refill   acetaminophen (TYLENOL) 500 MG tablet Take 1,000 mg by mouth every 6 (six) hours as needed for moderate pain.     albuterol (VENTOLIN HFA) 108 (90 Base) MCG/ACT inhaler Inhale 1-2 puffs into the lungs every 6 (six) hours as needed for wheezing or shortness of breath. 18 g 0   apixaban (ELIQUIS) 5 MG TABS tablet Take 1 tablet (5 mg total) by mouth 2 (two) times daily. 60 tablet 11   atorvastatin (LIPITOR) 40 MG tablet Take 1 tablet (40 mg total) by mouth daily. 90 tablet 3   carvedilol  (COREG) 12.5 MG tablet TAKE 1 TABLET(12.5 MG) BY MOUTH TWICE DAILY 60 tablet 2   dapagliflozin propanediol (FARXIGA) 5 MG TABS tablet Take 1 tablet (5 mg total) by mouth daily before breakfast. 30 tablet 5   furosemide (LASIX) 40 MG tablet Take 40 mg by mouth daily.     Meclizine HCl 25 MG CHEW Chew 1 tablet by mouth 3 (three) times daily as needed.     metFORMIN (GLUCOPHAGE) 500 MG tablet Take 500 mg by mouth 2 (two) times daily with a meal.      sacubitril-valsartan (ENTRESTO) 97-103 MG Take 1 tablet by mouth 2 (two) times daily. 60 tablet 11   spironolactone (ALDACTONE) 25 MG tablet TAKE 1 TABLET(25 MG) BY MOUTH DAILY 90 tablet 3   No current facility-administered medications for this visit.    No Known Allergies    Social History   Socioeconomic History   Marital status: Married    Spouse name: Rasheim Dahman   Number of children: 4   Years of education: Not on file   Highest education level: Associate degree: occupational, Hotel manager, or vocational program  Occupational History   Occupation: Librarian, academic    Comment: [employer]  Tobacco Use   Smoking status: Former    Packs/day: 0.50    Years: 32.00    Total pack years: 16.00    Types: Cigarettes  Start date: 10/15/1982    Quit date: 07/18/2021    Years since quitting: 1.3   Smokeless tobacco: Never   Tobacco comments:    3 cigaretts a day  Vaping Use   Vaping Use: Never used  Substance and Sexual Activity   Alcohol use: No    Alcohol/week: 0.0 standard drinks of alcohol   Drug use: No   Sexual activity: Yes    Birth control/protection: None  Other Topics Concern   Not on file  Social History Narrative   Divorced   No regular exercise   Social Determinants of Health   Financial Resource Strain: Low Risk  (07/21/2021)   Overall Financial Resource Strain (CARDIA)    Difficulty of Paying Living Expenses: Not very hard  Food Insecurity: No Food Insecurity (07/22/2022)   Hunger Vital Sign    Worried About  Running Out of Food in the Last Year: Never true    Ran Out of Food in the Last Year: Never true  Transportation Needs: No Transportation Needs (07/22/2022)   PRAPARE - Hydrologist (Medical): No    Lack of Transportation (Non-Medical): No  Physical Activity: Not on file  Stress: Not on file  Social Connections: Not on file  Intimate Partner Violence: Not At Risk (07/22/2022)   Humiliation, Afraid, Rape, and Kick questionnaire    Fear of Current or Ex-Partner: No    Emotionally Abused: No    Physically Abused: No    Sexually Abused: No      Family History  Problem Relation Age of Onset   Coronary artery disease Mother    Diabetes Father    Heart attack Father    Hypertension Father    Iron deficiency Father    Thyroid disease Sister    Cardiomyopathy Brother        No significant coronary disease by coronary angiography   Heart disease Maternal Grandfather        Also paternal grandfather   Sudden death Other    Colon cancer Neg Hx     PHYSICAL EXAM: There were no vitals filed for this visit.  Affect appropriate Healthy:  appears stated age 89: normal Neck supple with no adenopathy JVP normal no bruits no thyromegaly Lungs clear with no wheezing and good diaphragmatic motion Heart:  S1/S2 no murmur, no rub, gallop or click PMI enlarged  Abdomen: benighn, BS positve, no tenderness, no AAA no bruit.  No HSM or HJR Distal pulses intact with no bruits No edema Neuro non-focal Skin warm and dry No muscular weakness   DATA REVIEW  ECG: Normal sinus rhythm rate 65 narrow complex 11/13/22   ECHO: 10/22/22 EF < 20% mild RV dysfunction   CATH: RHC/LHC (07/19/21): No angiographic evidence of CAD Elevated right and left heart pressures (LV: 130/22/39, RA 15, RV 82/13/20, PA 83/30 mean 49, PCWP 35).   RHC/ 11/13/22  Conclusion  HEMODYNAMICS: RA:                  6 mmHg (mean) RV:                  71/6-8 mmHg PA:                  74/31  mmHg (48 mean) PCWP:            28 mmHg (mean)  Estimated Fick CO/CI   3.8 L/min, 1.6 L/min/m2 Thermodilution CO/CI   4.6 L/min, 1.9 L/min/m2                                              TPG                 20  mmHg                                            PVR                 5.3-4.3 Wood Units  PAPi                7     NIBP: 121/84     IMPRESSION: 1.Mildly elevated RA pressure.  2.Moderately elevated PA mean & PVR consistent with combined pre and post capillary PH; although, I believe a large component of his PH is driven by long term group II.  3.Normal PAPi and [id number] indicative of optimal RV function.  4.Severely elevated post-capillary filling pressures.  5.Severely reduced cardiac output / cardiac index by thermodilution & Fick.     RECOMMENDATIONS: Start evaluation for advanced therapies.    ASSESSMENT & PLAN:  Chronic systolic CHF:  NYHA 2B non ischemic euvolemic on optimum medical Rx Has primary prevention AICD in place QRS narrow Plan is for VAD evaluation Has cardiopulmonary stress test and cardiac MRI pending Right heart pressures suboptimal with low CI despite high PCWP.  Needs primary f/u with Advanced CHF clinic   2.  AICD  Primary prevention normal function f/u Dr Lovena Le    F/U with Dr Daniel Nones and Advanced CHF team F/U with Dr Lovena Le EP   Jenkins Rouge MD [facility]

## 2022-11-26 NOTE — Telephone Encounter (Signed)
Called pt and informed him of VAD evaluation schedule.  I have also mailed the pt a schedule with all appt times and details for each appt. Pt does not use mychart.  Tanda Rockers RN, BSN VAD Coordinator 24/7 Pager [phone]

## 2022-11-27 ENCOUNTER — Encounter: Payer: BC Managed Care – PPO | Admitting: Internal Medicine

## 2022-11-27 ENCOUNTER — Telehealth: Payer: Self-pay | Admitting: *Deleted

## 2022-11-27 ENCOUNTER — Encounter: Payer: Self-pay | Admitting: Internal Medicine

## 2022-11-27 ENCOUNTER — Ambulatory Visit: Payer: BC Managed Care – PPO | Attending: Internal Medicine | Admitting: Internal Medicine

## 2022-11-27 VITALS — BP 136/92 | HR 59 | Ht 78.0 in | Wt 254.6 lb

## 2022-11-27 DIAGNOSIS — I4729 Other ventricular tachycardia: Secondary | ICD-10-CM

## 2022-11-27 DIAGNOSIS — I428 Other cardiomyopathies: Secondary | ICD-10-CM | POA: Diagnosis not present

## 2022-11-27 LAB — CUP PACEART INCLINIC DEVICE CHECK
Date Time Interrogation Session: 20240213091842
Implantable Lead Connection Status: 753985
Implantable Lead Implant Date: 20221107
Implantable Lead Location: 753860
Implantable Lead Model: 436909
Implantable Lead Serial Number: 81476621
Implantable Pulse Generator Implant Date: 20221107
Pulse Gen Model: 429525
Pulse Gen Serial Number: 84864435

## 2022-11-27 LAB — NICOTINE/COTININE METABOLITES
Cotinine: 233.6 ng/mL
Nicotine: 11.2 ng/mL

## 2022-11-27 MED ORDER — APIXABAN 5 MG PO TABS: 5.0000 mg | ORAL_TABLET | Freq: Two times a day (BID) | ORAL | 11 refills | Status: DC

## 2022-11-27 NOTE — Patient Instructions (Addendum)
Medication Instructions:   Your physician recommends that you continue on your current medications as directed. Please refer to the Current Medication list given to you today.  Start Eliquis 5 mg Two Times Daily  Stop Taking Aspirin   *If you need a refill on your cardiac medications before your next appointment, please call your pharmacy*   Lab Work: NONE   If you have labs (blood work) drawn today and your tests are completely normal, you will receive your results only by: [place] (if you have MyChart) OR A paper copy in the mail If you have any lab test that is abnormal or we need to change your treatment, we will call you to review the results.   Testing/Procedures: NONE    Follow-Up: At [facility], you and your health needs are our priority.  As part of our continuing mission to provide you with exceptional heart care, we have created designated Provider Care Teams.  These Care Teams include your primary Cardiologist (physician) and Advanced Practice Providers (APPs -  Physician Assistants and Nurse Practitioners) who all work together to provide you with the care you need, when you need it.  We recommend signing up for the patient portal called "MyChart".  Sign up information is provided on this After Visit Summary.  MyChart is used to connect with patients for Virtual Visits (Telemedicine).  Patients are able to view lab/test results, encounter notes, upcoming appointments, etc.  Non-urgent messages can be sent to your provider as well.   To learn more about what you can do with MyChart, go to [email or web address].    Your next appointment:   1 year(s)  Provider:   Cristopher Peru, MD    Other Instructions Thank you for choosing [facility]!

## 2022-11-27 NOTE — Progress Notes (Signed)
HPI Mr. Sokalski returns today for followup. He is a pleasant 62 yo man with chronic systolic heart failure, non-ischemic CM and tobacco abuse. He is s/p ICD insertion for primary prevention. He denies chest pain or sob. He is smoking 1/2 PPD.  No palpitations.   No Known Allergies   Current Outpatient Medications  Medication Sig Dispense Refill   acetaminophen (TYLENOL) 500 MG tablet Take 1,000 mg by mouth every 6 (six) hours as needed for moderate pain.     albuterol (VENTOLIN HFA) 108 (90 Base) MCG/ACT inhaler Inhale 1-2 puffs into the lungs every 6 (six) hours as needed for wheezing or shortness of breath. 18 g 0   aspirin 81 MG chewable tablet Chew 81 mg by mouth in the morning.     atorvastatin (LIPITOR) 40 MG tablet Take 1 tablet (40 mg total) by mouth daily. 90 tablet 3   carvedilol (COREG) 12.5 MG tablet TAKE 1 TABLET(12.5 MG) BY MOUTH TWICE DAILY 60 tablet 2   dapagliflozin propanediol (FARXIGA) 5 MG TABS tablet Take 1 tablet (5 mg total) by mouth daily before breakfast. 30 tablet 5   furosemide (LASIX) 40 MG tablet Take 40 mg by mouth daily.     Meclizine HCl 25 MG CHEW Chew 1 tablet by mouth 3 (three) times daily as needed.     metFORMIN (GLUCOPHAGE) 500 MG tablet Take 500 mg by mouth 2 (two) times daily with a meal.      sacubitril-valsartan (ENTRESTO) 97-103 MG Take 1 tablet by mouth 2 (two) times daily. 60 tablet 11   spironolactone (ALDACTONE) 25 MG tablet TAKE 1 TABLET(25 MG) BY MOUTH DAILY 90 tablet 3   No current facility-administered medications for this visit.     Past Medical History:  Diagnosis Date   CKD (chronic kidney disease) stage 2, GFR 60-89 ml/min    Diabetes mellitus type II, non insulin dependent (HCC)    Dilated aortic root (HCC)    Hypercholesteremia    Hypertension    NICM (nonischemic cardiomyopathy) (HCC)    NSVT (nonsustained ventricular tachycardia) (HCC)    Obstructive sleep apnea    was positive but never given a CPAP   Pulmonary  hypertension (HCC)    PVC's (premature ventricular contractions)    Tobacco abuse     ROS:   All systems reviewed and negative except as noted in the HPI.   Past Surgical History:  Procedure Laterality Date   CARDIAC CATHETERIZATION  2002   COLONOSCOPY N/A 03/22/2014   Procedure: COLONOSCOPY;  Surgeon: Danie Binder, MD;  Location: [facility];  Service: Endoscopy;  Laterality: N/A;  11:15 AM   ICD IMPLANT N/A 08/21/2021   Procedure: ICD IMPLANT;  Surgeon: Evans Lance, MD;  Location: [facility];  Service: Cardiovascular;  Laterality: N/A;   INGUINAL HERNIA REPAIR Right 06/24/2017   Procedure: HERNIA REPAIR INGUINAL ADULT WITH MESH;  Surgeon: Aviva Signs, MD;  Location: [facility];  Service: General;  Laterality: Right;   RIGHT HEART CATH N/A 11/13/2022   Procedure: RIGHT HEART CATH;  Surgeon: Hebert Soho, DO;  Location: [facility];  Service: Cardiovascular;  Laterality: N/A;   RIGHT/LEFT HEART CATH AND CORONARY ANGIOGRAPHY N/A 07/19/2021   Procedure: RIGHT/LEFT HEART CATH AND CORONARY ANGIOGRAPHY;  Surgeon: Burnell Blanks, MD;  Location: [facility];  Service: Cardiovascular;  Laterality: N/A;     Family History  Problem Relation Age of Onset   Coronary artery disease Mother  Diabetes Father    Heart attack Father    Hypertension Father    Iron deficiency Father    Thyroid disease Sister    Cardiomyopathy Brother        No significant coronary disease by coronary angiography   Heart disease Maternal Grandfather        Also paternal grandfather   Sudden death Other    Colon cancer Neg Hx      Social History   Socioeconomic History   Marital status: Married    Spouse name: Josephus Cruthirds   Number of children: 4   Years of education: Not on file   Highest education level: Associate degree: occupational, Hotel manager, or vocational program  Occupational History   Occupation: Librarian, academic    Comment: [employer]  Tobacco Use    Smoking status: Former    Packs/day: 0.50    Years: 32.00    Total pack years: 16.00    Types: Cigarettes    Start date: 10/15/1982    Quit date: 07/18/2021    Years since quitting: 1.3   Smokeless tobacco: Never   Tobacco comments:    3 cigaretts a day  Vaping Use   Vaping Use: Never used  Substance and Sexual Activity   Alcohol use: No    Alcohol/week: 0.0 standard drinks of alcohol   Drug use: No   Sexual activity: Yes    Birth control/protection: None  Other Topics Concern   Not on file  Social History Narrative   Divorced   No regular exercise   Social Determinants of Health   Financial Resource Strain: Low Risk  (07/21/2021)   Overall Financial Resource Strain (CARDIA)    Difficulty of Paying Living Expenses: Not very hard  Food Insecurity: No Food Insecurity (07/22/2022)   Hunger Vital Sign    Worried About Running Out of Food in the Last Year: Never true    Ran Out of Food in the Last Year: Never true  Transportation Needs: No Transportation Needs (07/22/2022)   PRAPARE - Hydrologist (Medical): No    Lack of Transportation (Non-Medical): No  Physical Activity: Not on file  Stress: Not on file  Social Connections: Not on file  Intimate Partner Violence: Not At Risk (07/22/2022)   Humiliation, Afraid, Rape, and Kick questionnaire    Fear of Current or Ex-Partner: No    Emotionally Abused: No    Physically Abused: No    Sexually Abused: No     BP (!) 136/92   Pulse (!) 59   Ht 6' 6"$  (1.981 m)   Wt 254 lb 9.6 oz (115.5 kg)   SpO2 98%   BMI 29.42 kg/m   Physical Exam:  Well appearing NAD HEENT: Unremarkable Neck:  No JVD, no thyromegally Lymphatics:  No adenopathy Back:  No CVA tenderness Lungs:  Clear with no wheezes HEART:  Regular rate rhythm, no murmurs, no rubs, no clicks Abd:  soft, positive bowel sounds, no organomegally, no rebound, no guarding Ext:  2 plus pulses, no edema, no cyanosis, no clubbing Skin:  No rashes  no nodules Neuro:  CN II through XII intact, motor grossly intact   DEVICE  Normal device function.  See PaceArt for details.   Assess/Plan: Chronic systolic heart failure - his symptoms are well compensated and class 2. He will continue GDMT. Tobacco abuse - he is encouraged to stop smoking.  ICD - his [employer] VDD ICD is working normally.  Atrial fib/flutter -  He is asymptomatic but has had up to an hour at a time. He will start eliquis.    Carleene Overlie Kenidi Elenbaas,MD

## 2022-12-04 LAB — FACTOR 5 LEIDEN

## 2022-12-05 ENCOUNTER — Encounter (HOSPITAL_COMMUNITY): Payer: BC Managed Care – PPO

## 2022-12-05 ENCOUNTER — Ambulatory Visit (HOSPITAL_COMMUNITY): Payer: BC Managed Care – PPO

## 2022-12-05 ENCOUNTER — Inpatient Hospital Stay (HOSPITAL_COMMUNITY): Admission: RE | Admit: 2022-12-05 | Payer: BC Managed Care – PPO | Source: Ambulatory Visit

## 2022-12-05 ENCOUNTER — Encounter: Payer: BC Managed Care – PPO | Admitting: Cardiothoracic Surgery

## 2022-12-05 ENCOUNTER — Ambulatory Visit (HOSPITAL_COMMUNITY): Admission: RE | Admit: 2022-12-05 | Payer: BC Managed Care – PPO | Source: Ambulatory Visit

## 2022-12-06 ENCOUNTER — Encounter (HOSPITAL_COMMUNITY): Payer: Self-pay

## 2022-12-06 ENCOUNTER — Ambulatory Visit: Payer: BC Managed Care – PPO | Admitting: Cardiovascular Disease

## 2022-12-07 ENCOUNTER — Ambulatory Visit (HOSPITAL_COMMUNITY): Payer: BC Managed Care – PPO

## 2022-12-07 ENCOUNTER — Ambulatory Visit (HOSPITAL_BASED_OUTPATIENT_CLINIC_OR_DEPARTMENT_OTHER): Payer: BC Managed Care – PPO

## 2022-12-07 ENCOUNTER — Encounter (HOSPITAL_COMMUNITY): Payer: BC Managed Care – PPO

## 2022-12-10 ENCOUNTER — Encounter: Payer: Self-pay | Admitting: Cardiovascular Disease

## 2022-12-10 ENCOUNTER — Ambulatory Visit: Payer: BC Managed Care – PPO | Attending: Cardiovascular Disease | Admitting: Cardiovascular Disease

## 2022-12-10 VITALS — BP 124/90 | HR 64 | Ht 78.0 in | Wt 256.4 lb

## 2022-12-10 DIAGNOSIS — I7121 Aneurysm of the ascending aorta, without rupture: Secondary | ICD-10-CM

## 2022-12-10 DIAGNOSIS — Z95810 Presence of automatic (implantable) cardiac defibrillator: Secondary | ICD-10-CM

## 2022-12-10 DIAGNOSIS — I428 Other cardiomyopathies: Secondary | ICD-10-CM | POA: Diagnosis not present

## 2022-12-10 NOTE — Patient Instructions (Signed)
Medication Instructions:  Your physician recommends that you continue on your current medications as directed. Please refer to the Current Medication list given to you today.  *If you need a refill on your cardiac medications before your next appointment, please call your pharmacy*   Lab Work: NONE   If you have labs (blood work) drawn today and your tests are completely normal, you will receive your results only by: [place] (if you have MyChart) OR A paper copy in the mail If you have any lab test that is abnormal or we need to change your treatment, we will call you to review the results.   Testing/Procedures: NONE    Follow-Up: At [facility], you and your health needs are our priority.  As part of our continuing mission to provide you with exceptional heart care, we have created designated Provider Care Teams.  These Care Teams include your primary Cardiologist (physician) and Advanced Practice Providers (APPs -  Physician Assistants and Nurse Practitioners) who all work together to provide you with the care you need, when you need it.  We recommend signing up for the patient portal called "MyChart".  Sign up information is provided on this After Visit Summary.  MyChart is used to connect with patients for Virtual Visits (Telemedicine).  Patients are able to view lab/test results, encounter notes, upcoming appointments, etc.  Non-urgent messages can be sent to your provider as well.   To learn more about what you can do with MyChart, go to [email or web address].    Your next appointment:   6 month(s)  Provider:   Cristopher Peru, MD    Other Instructions Thank you for choosing [facility]!

## 2022-12-13 ENCOUNTER — Ambulatory Visit (HOSPITAL_COMMUNITY)
Admission: RE | Admit: 2022-12-13 | Discharge: 2022-12-13 | Disposition: A | Discharge status: Home / Self Care | Payer: BC Managed Care – PPO | Source: Ambulatory Visit | Attending: Cardiology | Admitting: Cardiology

## 2022-12-13 ENCOUNTER — Ambulatory Visit (HOSPITAL_BASED_OUTPATIENT_CLINIC_OR_DEPARTMENT_OTHER)
Admission: RE | Admit: 2022-12-13 | Discharge: 2022-12-13 | Disposition: A | Discharge status: Home / Self Care | Payer: BC Managed Care – PPO | Source: Ambulatory Visit | Attending: Cardiology | Admitting: Cardiology

## 2022-12-13 ENCOUNTER — Encounter: Payer: Self-pay | Admitting: Cardiothoracic Surgery

## 2022-12-13 ENCOUNTER — Institutional Professional Consult (permissible substitution) (INDEPENDENT_AMBULATORY_CARE_PROVIDER_SITE_OTHER): Payer: BC Managed Care – PPO | Admitting: Cardiothoracic Surgery

## 2022-12-13 VITALS — BP 115/76 | HR 70 | Resp 20 | Ht 78.0 in | Wt 256.0 lb

## 2022-12-13 DIAGNOSIS — I428 Other cardiomyopathies: Secondary | ICD-10-CM | POA: Insufficient documentation

## 2022-12-13 LAB — PULMONARY FUNCTION TEST
DL/VA % pred: 84 %
DL/VA: 3.46 ml/min/mmHg/L
DLCO unc % pred: 60 %
DLCO unc: 20.99 ml/min/mmHg
FEF 25-75 Post: 1.49 L/sec
FEF 25-75 Pre: 1.28 L/sec
FEF2575-%Change-Post: 16 %
FEF2575-%Pred-Post: 40 %
FEF2575-%Pred-Pre: 34 %
FEV1-%Change-Post: 5 %
FEV1-%Pred-Post: 54 %
FEV1-%Pred-Pre: 51 %
FEV1-Post: 2.52 L
FEV1-Pre: 2.39 L
FEV1FVC-%Change-Post: 3 %
FEV1FVC-%Pred-Pre: 82 %
FEV6-%Change-Post: 0 %
FEV6-%Pred-Post: 62 %
FEV6-%Pred-Pre: 62 %
FEV6-Post: 3.69 L
FEV6-Pre: 3.67 L
FEV6FVC-%Change-Post: -1 %
FEV6FVC-%Pred-Post: 98 %
FEV6FVC-%Pred-Pre: 99 %
FVC-%Change-Post: 1 %
FVC-%Pred-Post: 63 %
FVC-%Pred-Pre: 62 %
FVC-Post: 3.9 L
FVC-Pre: 3.83 L
Post FEV1/FVC ratio: 65 %
Post FEV6/FVC ratio: 95 %
Pre FEV1/FVC ratio: 62 %
Pre FEV6/FVC Ratio: 96 %
RV % pred: 111 %
RV: 3.01 L
TLC % pred: 81 %
TLC: 7.06 L

## 2022-12-13 MED ORDER — ALBUTEROL SULFATE (2.5 MG/3ML) 0.083% IN NEBU
2.5000 mg | INHALATION_SOLUTION | Freq: Once | RESPIRATORY_TRACT | Status: AC
  Administered 2022-12-13: 2.5 mg via RESPIRATORY_TRACT

## 2022-12-13 NOTE — Progress Notes (Signed)
CSW met with patient and wife in the clinic to discuss LVAD psychosocial assessment. CSW spent time explaining about the LVAD, hospital course and role of the caregiver. Patient is currently working full time and hopes to return to work post surgery. Patient and wife asked good questions about life with an LVAD. CSW started the full psychosocial assessment although patient had an appointment at [facility] and unable to complete full assessment. CSW will follow up with patient at next clinic visit to complete assessment. Patient appears to be in good spirits and motivated for improved health. Raquel Sarna, [place], [place]

## 2022-12-13 NOTE — H&P (View-Only) (Signed)
    [address]       [place] [address]             [phone]                    Burch Lee Zwart [facility] Medical Record #[id number] Date of Birth: 10/16/1960  Referring: Sabharwal, Aditya, DO Primary Care: Dixon, Phillip E, MD Primary Cardiologist: Peter Nishan, MD  Chief Complaint:   No chief complaint on file.   History of Present Illness:    James [name] 62 y.o. male referred for LVAD evaluation. He has has NICM since 2011. He has been on GDMT. He has progressively worse DOE and functional status.     Per cardiology hpi James [name] is a 62 y.o. male with nonischemic cardiomyopathy s/p ICD, HTNm, T2DM, aortic root aneurysm presenting today to establish care. [name]'s HF dates back to 2011 when he was diagnosed with nonischemic cardiomyopathy.  According to [name] he came to the [facility] system in 2013 when he had a left heart cath with no coronary disease with a EF of 20%, he was started on low-dose GDMT at that time.  By 2013 his EF improved to 45%.  He followed yearly with Dr. Peter admission until 2022 when he once again had a reduction in LVEF to 20 to 25% patient was started on Entresto With a repeat heart cath demonstrating no significant coronary disease.  For the past 1 year despite the addition of GDMT, patient has had progressively worsening exercise capacity with no improvement in LV function.  Referred to heart failure for further evaluation.   Interval hx:  Since his last appointment, no longer having issues with SOB, LE edema; he is able to ambulate further without difficulty; no PND; reports compliance with all HF meds.    Activity level/exercise tolerance: NYHA IIb Orthopnea:  Sleeps on 2 pillows Paroxysmal noctural dyspnea: No Chest pain/pressure: No Orthostatic lightheadedness: No Palpitations: No Lower extremity edema: No Presyncope/syncope: No Cough: No   Past Medical History:  Diagnosis Date   CKD (chronic  kidney disease) stage 2, GFR 60-89 ml/min    Diabetes mellitus type II, non insulin dependent (HCC)    Dilated aortic root (HCC)    Hypercholesteremia    Hypertension    NICM (nonischemic cardiomyopathy) (HCC)    NSVT (nonsustained ventricular tachycardia) (HCC)    Obstructive sleep apnea    was positive but never given a CPAP   Pulmonary hypertension (HCC)    PVC's (premature ventricular contractions)    Tobacco abuse     Past Surgical History:  Procedure Laterality Date   CARDIAC CATHETERIZATION  2002   COLONOSCOPY N/A 03/22/2014   Procedure: COLONOSCOPY;  Surgeon: Sandi L Fields, MD;  Location: [facility];  Service: Endoscopy;  Laterality: N/A;  11:15 AM   ICD IMPLANT N/A 08/21/2021   Procedure: ICD IMPLANT;  Surgeon: Taylor, Gregg W, MD;  Location: [facility];  Service: Cardiovascular;  Laterality: N/A;   INGUINAL HERNIA REPAIR Right 06/24/2017   Procedure: HERNIA REPAIR INGUINAL ADULT WITH MESH;  Surgeon: Jenkins, Mark, MD;  Location: [facility] OR[facility];  Service: General;  Laterality: Right;   RIGHT HEART CATH N/A 11/13/2022   Procedure: RIGHT HEART CATH;  Surgeon: Sabharwal, Aditya, DO;  Location: [facility];  Service: Cardiovascular;  Laterality: N/A;   RIGHT/LEFT HEART CATH AND CORONARY ANGIOGRAPHY N/A 07/19/2021   Procedure: RIGHT/LEFT HEART CATH AND   CORONARY ANGIOGRAPHY;  Surgeon: McAlhany, Christopher D, MD;  Location: [facility];  Service: Cardiovascular;  Laterality: N/A;    Family History  Problem Relation Age of Onset   Coronary artery disease Mother    Diabetes Father    Heart attack Father    Hypertension Father    Iron deficiency Father    Thyroid disease Sister    Cardiomyopathy Brother        No significant coronary disease by coronary angiography   Heart disease Maternal Grandfather        Also paternal grandfather   Sudden death Other    Colon cancer Neg Hx      Social History   Tobacco Use  Smoking Status Former   Packs/day: 0.50    Years: 32.00   Total pack years: 16.00   Types: Cigarettes   Start date: 10/15/1982   Quit date: 07/18/2021   Years since quitting: 1.4  Smokeless Tobacco Never  Tobacco Comments   3 cigaretts a day    Social History   Substance and Sexual Activity  Alcohol Use No   Alcohol/week: 0.0 standard drinks of alcohol     No Known Allergies  Current Outpatient Medications  Medication Sig Dispense Refill   acetaminophen (TYLENOL) 500 MG tablet Take 1,000 mg by mouth every 6 (six) hours as needed for moderate pain.     albuterol (VENTOLIN HFA) 108 (90 Base) MCG/ACT inhaler Inhale 1-2 puffs into the lungs every 6 (six) hours as needed for wheezing or shortness of breath. 18 g 0   apixaban (ELIQUIS) 5 MG TABS tablet Take 1 tablet (5 mg total) by mouth 2 (two) times daily. 60 tablet 11   atorvastatin (LIPITOR) 40 MG tablet Take 1 tablet (40 mg total) by mouth daily. 90 tablet 3   carvedilol (COREG) 12.5 MG tablet TAKE 1 TABLET(12.5 MG) BY MOUTH TWICE DAILY 60 tablet 2   dapagliflozin propanediol (FARXIGA) 5 MG TABS tablet Take 1 tablet (5 mg total) by mouth daily before breakfast. 30 tablet 5   furosemide (LASIX) 40 MG tablet Take 40 mg by mouth daily.     Meclizine HCl 25 MG CHEW Chew 1 tablet by mouth 3 (three) times daily as needed.     metFORMIN (GLUCOPHAGE) 500 MG tablet Take 500 mg by mouth 2 (two) times daily with a meal.      sacubitril-valsartan (ENTRESTO) 97-103 MG Take 1 tablet by mouth 2 (two) times daily. 60 tablet 11   spironolactone (ALDACTONE) 25 MG tablet TAKE 1 TABLET(25 MG) BY MOUTH DAILY 90 tablet 3   No current facility-administered medications for this visit.    ROS 14 point ROS reviewed and negative except as per HPI   PHYSICAL EXAMINATION: There were no vitals taken for this visit.  Gen: NAD Neuro: Alert and oriented Resp: Nonlaboured Abd: Soft, ntnd Extr: WWP  Diagnostic Studies & Laboratory data:     Recent Radiology Findings:   VAS US DOPPLER PRE  VAD  Result Date: 12/13/2022 PERIOPERATIVE VASCULAR EVALUATION Patient Name:  James [name]  Date of Exam:   12/13/2022 Medical Rec #: [id number]          Accession #:    [id number] Date of Birth: 09/17/1961          Patient Gender: M Patient Age:   61 years Exam Location:  [facility] Procedure:      VAS US DOPPLER PRE VAD Referring Phys: ADITYA SABHARWAL --------------------------------------------------------------------------------  Indications:        Pre-op LVAD evaluation. Risk Factors:     Hypertension, Diabetes, current smoker. Comparison Study: Previous carotid 09/12/05.                   Previous ABI 11/07/21. Performing Technologist: McKayla Maag RVT, [place]  Examination Guidelines: A complete evaluation includes B-mode imaging, spectral Doppler, color Doppler, and power Doppler as needed of all accessible portions of each vessel. Bilateral testing is considered an integral part of a complete examination. Limited examinations for reoccurring indications may be performed as noted.  Right Carotid Findings: +----------+--------+--------+--------+--------+--------+           PSV cm/sEDV cm/sStenosisDescribeComments +----------+--------+--------+--------+--------+--------+ CCA Prox  61      7                                +----------+--------+--------+--------+--------+--------+ CCA Distal44      13                               +----------+--------+--------+--------+--------+--------+ ICA Prox  28      11      1-39%                    +----------+--------+--------+--------+--------+--------+ ICA Mid   43      17                               +----------+--------+--------+--------+--------+--------+ ICA Distal51      19                               +----------+--------+--------+--------+--------+--------+ ECA       51      9                                +----------+--------+--------+--------+--------+--------+  +----------+--------+-------+----------------+------------+           PSV cm/sEDV cmsDescribe        Arm Pressure +----------+--------+-------+----------------+------------+ Subclavian69             Multiphasic, WNL103          +----------+--------+-------+----------------+------------+ +---------+--------+--+--------+--+---------+ VertebralPSV cm/s28EDV cm/s11Antegrade +---------+--------+--+--------+--+---------+ Left Carotid Findings: +----------+--------+--------+--------+--------+--------+           PSV cm/sEDV cm/sStenosisDescribeComments +----------+--------+--------+--------+--------+--------+ CCA Prox  83      18                               +----------+--------+--------+--------+--------+--------+ CCA Distal47      16                               +----------+--------+--------+--------+--------+--------+ ICA Prox  35      17      1-39%                    +----------+--------+--------+--------+--------+--------+ ICA Mid   48      20                               +----------+--------+--------+--------+--------+--------+ ICA Distal62      21                               +----------+--------+--------+--------+--------+--------+   ECA       55      13                               +----------+--------+--------+--------+--------+--------+ +----------+--------+--------+----------------+------------+ SubclavianPSV cm/sEDV cm/sDescribe        Arm Pressure +----------+--------+--------+----------------+------------+           74              Multiphasic, WNL105          +----------+--------+--------+----------------+------------+ +---------+--------+--+--------+--+---------+ VertebralPSV cm/s41EDV cm/s16Antegrade +---------+--------+--+--------+--+---------+  ABI Findings: +---------+------------------+-----+----------+--------+ Right    Rt Pressure (mmHg)IndexWaveform  Comment   +---------+------------------+-----+----------+--------+ Brachial 103                    triphasic          +---------+------------------+-----+----------+--------+ PTA      92                0.88 monophasic         +---------+------------------+-----+----------+--------+ DP       79                0.75 monophasic         +---------+------------------+-----+----------+--------+ Great Toe39                     Abnormal           +---------+------------------+-----+----------+--------+ +---------+------------------+-----+----------+-------+ Left     Lt Pressure (mmHg)IndexWaveform  Comment +---------+------------------+-----+----------+-------+ Brachial 105                    triphasic         +---------+------------------+-----+----------+-------+ PTA      73                0.70 monophasic        +---------+------------------+-----+----------+-------+ DP       77                0.73 biphasic          +---------+------------------+-----+----------+-------+ Great Toe53                     Abnormal          +---------+------------------+-----+----------+-------+ +-------+---------------+----------------+ ABI/TBIToday's ABI/TBIPrevious ABI/TBI +-------+---------------+----------------+ Right  0.88           0.64             +-------+---------------+----------------+ Left   0.73           0.75             +-------+---------------+----------------+  Summary: Right Carotid: Velocities in the right ICA are consistent with a 1-39% stenosis. Left Carotid: Velocities in the left ICA are consistent with a 1-39% stenosis. Vertebrals:  Bilateral vertebral arteries demonstrate antegrade flow. Subclavians: Normal flow hemodynamics were seen in bilateral subclavian              arteries.  *See table(s) above for measurements and observations. Right ABI: Resting right ankle-brachial index indicates mild right lower extremity arterial disease. The right toe-brachial  index is abnormal. Left ABI: Resting left ankle-brachial index indicates moderate left lower extremity arterial disease. The left toe-brachial index is abnormal.     Preliminary    VAS US LOWER EXTREMITY VENOUS (DVT)  Result Date: 12/13/2022  Lower Venous DVT Study Patient Name:  James [name]  Date of Exam:   12/13/2022 Medical Rec #: [id number]            Accession #:    [id number] Date of Birth: 05/09/1961          Patient Gender: M Patient Age:   61 years Exam Location:  [facility] Procedure:      VAS US LOWER EXTREMITY VENOUS (DVT) Referring Phys: ADITYA SABHARWAL --------------------------------------------------------------------------------  Indications: Pre-op LVAD evaluation.  Comparison Study: Previous 05/20/20 negative. Performing Technologist: McKayla Maag RVT, [place]  Examination Guidelines: A complete evaluation includes B-mode imaging, spectral Doppler, color Doppler, and power Doppler as needed of all accessible portions of each vessel. Bilateral testing is considered an integral part of a complete examination. Limited examinations for reoccurring indications may be performed as noted. The reflux portion of the exam is performed with the patient in reverse Trendelenburg.  +---------+---------------+---------+-----------+----------+--------------+ RIGHT    CompressibilityPhasicitySpontaneityPropertiesThrombus Aging +---------+---------------+---------+-----------+----------+--------------+ CFV      Full           Yes      Yes                                 +---------+---------------+---------+-----------+----------+--------------+ SFJ      Full                                                        +---------+---------------+---------+-----------+----------+--------------+ FV Prox  Full                                                        +---------+---------------+---------+-----------+----------+--------------+ FV Mid   Full                                                         +---------+---------------+---------+-----------+----------+--------------+ FV DistalFull                                                        +---------+---------------+---------+-----------+----------+--------------+ PFV      Full                                                        +---------+---------------+---------+-----------+----------+--------------+ POP      Full           Yes      Yes                                 +---------+---------------+---------+-----------+----------+--------------+ PTV      Full                                                        +---------+---------------+---------+-----------+----------+--------------+   PERO     Full                                                        +---------+---------------+---------+-----------+----------+--------------+   +---------+---------------+---------+-----------+----------+--------------+ LEFT     CompressibilityPhasicitySpontaneityPropertiesThrombus Aging +---------+---------------+---------+-----------+----------+--------------+ CFV      Full           Yes      Yes                                 +---------+---------------+---------+-----------+----------+--------------+ SFJ      Full                                                        +---------+---------------+---------+-----------+----------+--------------+ FV Prox  Full                                                        +---------+---------------+---------+-----------+----------+--------------+ FV Mid   Full                                                        +---------+---------------+---------+-----------+----------+--------------+ FV DistalFull                                                        +---------+---------------+---------+-----------+----------+--------------+ PFV      Full                                                         +---------+---------------+---------+-----------+----------+--------------+ POP      Full           Yes      Yes                                 +---------+---------------+---------+-----------+----------+--------------+ PTV      Full                                                        +---------+---------------+---------+-----------+----------+--------------+ PERO     Full                                                        +---------+---------------+---------+-----------+----------+--------------+       Summary: BILATERAL: - No evidence of deep vein thrombosis seen in the lower extremities, bilaterally. - No evidence of superficial venous thrombosis in the lower extremities, bilaterally. -No evidence of popliteal cyst, bilaterally.   *See table(s) above for measurements and observations.    Preliminary    CUP PACEART INCLINIC DEVICE CHECK  Result Date: 11/27/2022 ICD check in clinic. Normal device function. Thresholds and sensing consistent with previous device measurements. Impedance trends stable over time. Afib noted. Nonsustained ventricular arrhythmias noted-no therapies. Histogram distribution appropriate for patient and level of activity. No changes made this session. Device programmed at appropriate safety margins. Device programmed to optimize intrinsic conduction. Estimated longevity 100% battery. Pt enrolled in remote follow-up. Patient education completed including shock plan. Auditory/vibratory alert demonstrated.Jenny Smith, BSN, RN  CUP PACEART REMOTE DEVICE CHECK  Result Date: 11/20/2022 Scheduled remote reviewed. Normal device function.  13 NSVT, no therapy 11 atrial monitoring episodes, available EGM's show AF/AFL longest duration 33min, mean HR 138, burden 0%, ASA only - route to triage Next remote 91 days. LA      I have independently reviewed the above radiology studies  and reviewed the findings with the patient.   Recent Lab Findings: Lab Results   Component Value Date   WBC 6.8 11/23/2022   HGB 18.5 (H) 11/23/2022   HCT 53.5 (H) 11/23/2022   PLT 144 (L) 11/23/2022   GLUCOSE 118 (H) 11/23/2022   CHOL 237 (H) 11/23/2022   TRIG 86 11/23/2022   HDL 49 11/23/2022   LDLCALC 171 (H) 11/23/2022   ALT 19 11/23/2022   AST 19 11/23/2022   NA 135 11/23/2022   K 4.1 11/23/2022   CL 101 11/23/2022   CREATININE 1.30 (H) 11/23/2022   BUN 23 11/23/2022   CO2 23 11/23/2022   TSH 1.852 11/23/2022   INR 1.1 11/23/2022   HGBA1C 6.6 (H) 11/23/2022   Jan 2024 RHC  Conclusion  HEMODYNAMICS: RA:                  6 mmHg (mean) RV:                  71/6-8 mmHg PA:                  74/31 mmHg (48 mean) PCWP:            28 mmHg (mean)                                      Estimated Fick CO/CI   3.8 L/min, 1.6 L/min/m2 Thermodilution CO/CI   4.6 L/min, 1.9 L/min/m2                                              TPG                 20  mmHg                                            PVR                 5.3-4.3 Wood Units  PAPi                  7     NIBP: 121/84     IMPRESSION: 1.Mildly elevated RA pressure.  2.Moderately elevated PA mean & PVR consistent with combined pre and post capillary PH; although, I believe a large component of his PH is driven by long term group II.  3.Normal PAPi and RA:PCWP indicative of optimal RV function.  4.Severely elevated post-capillary filling pressures.  5.Severely reduced cardiac output / cardiac index by thermodilution & Fick.     RECOMMENDATIONS: Start evaluation for advanced therapies.      Assessment / Plan:   61M with NICM undergoing LVAD evaluation.  EF < 25% and symptomatic despite GDMT.  By echo, LVIDd:  5.20 cm, LVIDs: 4.80 cm. No sig AI, PFO or TR By RHC, PAsystolic 70s and PVR > 4 For labs Cr was up to 1.6, now down to 1.3.   Given high PVR, not a candidate for transplant at this time. Maybe be a candidate in the future at some point, depending on extent of LV unloading we achieve.    Operative considerations: Aortic finding around RCA in the aortic root is on element of this case. Will evaluate intraoperatively as well.  LVIDD is 5.2cm. Typically good to see this value >5.5cm. We can perform LVAD down to 4.8cm, however, this increases rate of suction events and cannula inflow issues / apparent malposition due to small LV area.   Risks/benefits/alternatives were discussed at length (80% straightforward recovery, 15% morbidity [any organ, bleeding, infection, stroke, long ICU and hospital stay due to any organ], 5% mortality). Straightforward recovery typically entails 5-10 ICU days, 7-10 days on the floor, seeing me back in clinic at 1 week and 1 month postoperatively. No lifting greater than 10 lbs for 6 weeks. Total recovery expected by 2-3 months. All questions were asked and answered.   I  spent 60 minutes with  the patient face to face in counseling and coordination of care.    Lynx Goodrich H Shandrea Lusk 12/13/2022 3:08 PM       S/P MRB  We discussed PSA is elevated and seen on CT - see urology  We discussed his aorta. Not felt to intervene at time. Plan TEE/RHC when preadmit for surgery and can baseline. Doesn't need MR or CT before VAD.  We discussed his Echo LVIDD 5.2cm. Long heart. Should be fine for VAD.   We discussed that he would wait a long time for heart transplant given 6'6" and blood type O, on top of fact that he is still smoking and PVR is prohibitive.    Needs urology and dental and preop TEE/RHC.     

## 2022-12-13 NOTE — Progress Notes (Addendum)
[address]       [place] [address]             [phone]                    [place] Record [id number] Date of Birth: [birth year]  Referring: Hebert Soho, DO Primary Care: Johnette Abraham, MD Primary Cardiologist: Jenkins Rouge, MD  Chief Complaint:   No chief complaint on file.   History of Present Illness:    Sriram Rhinehart 62 y.o. male referred for LVAD evaluation. He has has NICM since 2011. He has been on GDMT. He has progressively worse DOE and functional status.     Per cardiology hpi Dell Santellan is a 62 y.o. male with nonischemic cardiomyopathy s/p ICD, HTNm, T2DM, aortic root aneurysm presenting today to establish care. Mr. Florer HF dates back to 2011 when he was diagnosed with nonischemic cardiomyopathy.  According to Mr. Vota he came to the [facility] system in 2013 when he had a left heart cath with no coronary disease with a EF of 20%, he was started on low-dose GDMT at that time.  By 2013 his EF improved to 45%.  He followed yearly with Dr. Collier Salina admission until 2022 when he once again had a reduction in LVEF to 20 to 25% patient was started on Entresto With a repeat heart cath demonstrating no significant coronary disease.  For the past 1 year despite the addition of GDMT, patient has had progressively worsening exercise capacity with no improvement in LV function.  Referred to heart failure for further evaluation.   Interval hx:  Since his last appointment, no longer having issues with SOB, LE edema; he is able to ambulate further without difficulty; no PND; reports compliance with all HF meds.    Activity level/exercise tolerance: NYHA IIb Orthopnea:  Sleeps on 2 pillows Paroxysmal noctural dyspnea: No Chest pain/pressure: No Orthostatic lightheadedness: No Palpitations: No Lower extremity edema: No Presyncope/syncope: No Cough: No   Past Medical History:  Diagnosis Date   CKD (chronic  kidney disease) stage 2, GFR 60-89 ml/min    Diabetes mellitus type II, non insulin dependent (HCC)    Dilated aortic root (HCC)    Hypercholesteremia    Hypertension    NICM (nonischemic cardiomyopathy) (HCC)    NSVT (nonsustained ventricular tachycardia) (HCC)    Obstructive sleep apnea    was positive but never given a CPAP   Pulmonary hypertension ([place])    PVC's (premature ventricular contractions)    Tobacco abuse     Past Surgical History:  Procedure Laterality Date   CARDIAC CATHETERIZATION  2002   COLONOSCOPY N/A 03/22/2014   Procedure: COLONOSCOPY;  Surgeon: Danie Binder, MD;  Location: [facility];  Service: Endoscopy;  Laterality: N/A;  11:15 AM   ICD IMPLANT N/A 08/21/2021   Procedure: ICD IMPLANT;  Surgeon: Evans Lance, MD;  Location: [place] [facility];  Service: Cardiovascular;  Laterality: N/A;   INGUINAL HERNIA REPAIR Right 06/24/2017   Procedure: HERNIA REPAIR INGUINAL ADULT WITH MESH;  Surgeon: Aviva Signs, MD;  Location: [facility];  Service: General;  Laterality: Right;   RIGHT HEART CATH N/A 11/13/2022   Procedure: RIGHT HEART CATH;  Surgeon: Hebert Soho, DO;  Location: [place] [facility];  Service: Cardiovascular;  Laterality: N/A;   RIGHT/LEFT HEART CATH AND CORONARY ANGIOGRAPHY N/A 07/19/2021   Procedure: RIGHT/LEFT HEART CATH AND  CORONARY ANGIOGRAPHY;  Surgeon: Burnell Blanks, MD;  Location: [place] [facility];  Service: Cardiovascular;  Laterality: N/A;    Family History  Problem Relation Age of Onset   Coronary artery disease Mother    Diabetes Father    Heart attack Father    Hypertension Father    Iron deficiency Father    Thyroid disease Sister    Cardiomyopathy Brother        No significant coronary disease by coronary angiography   Heart disease Maternal Grandfather        Also paternal grandfather   Sudden death Other    Colon cancer Neg Hx      Social History   Tobacco Use  Smoking Status Former   Packs/day: 0.50    Years: 32.00   Total pack years: 16.00   Types: Cigarettes   Start date: 10/15/1982   Quit date: 07/18/2021   Years since quitting: 1.4  Smokeless Tobacco Never  Tobacco Comments   3 cigaretts a day    Social History   Substance and Sexual Activity  Alcohol Use No   Alcohol/week: 0.0 standard drinks of alcohol     No Known Allergies  Current Outpatient Medications  Medication Sig Dispense Refill   acetaminophen (TYLENOL) 500 MG tablet Take 1,000 mg by mouth every 6 (six) hours as needed for moderate pain.     albuterol (VENTOLIN HFA) 108 (90 Base) MCG/ACT inhaler Inhale 1-2 puffs into the lungs every 6 (six) hours as needed for wheezing or shortness of breath. 18 g 0   apixaban (ELIQUIS) 5 MG TABS tablet Take 1 tablet (5 mg total) by mouth 2 (two) times daily. 60 tablet 11   atorvastatin (LIPITOR) 40 MG tablet Take 1 tablet (40 mg total) by mouth daily. 90 tablet 3   carvedilol (COREG) 12.5 MG tablet TAKE 1 TABLET(12.5 MG) BY MOUTH TWICE DAILY 60 tablet 2   dapagliflozin propanediol (FARXIGA) 5 MG TABS tablet Take 1 tablet (5 mg total) by mouth daily before breakfast. 30 tablet 5   furosemide (LASIX) 40 MG tablet Take 40 mg by mouth daily.     Meclizine HCl 25 MG CHEW Chew 1 tablet by mouth 3 (three) times daily as needed.     metFORMIN (GLUCOPHAGE) 500 MG tablet Take 500 mg by mouth 2 (two) times daily with a meal.      sacubitril-valsartan (ENTRESTO) 97-103 MG Take 1 tablet by mouth 2 (two) times daily. 60 tablet 11   spironolactone (ALDACTONE) 25 MG tablet TAKE 1 TABLET(25 MG) BY MOUTH DAILY 90 tablet 3   No current facility-administered medications for this visit.    ROS 14 point ROS reviewed and negative except as per HPI   PHYSICAL EXAMINATION: There were no vitals taken for this visit.  Gen: NAD Neuro: Alert and oriented Resp: Nonlaboured Abd: Soft, ntnd Extr: WWP  Diagnostic Studies & Laboratory data:     Recent Radiology Findings:   VAS US DOPPLER PRE  VAD  Result Date: 12/13/2022 PERIOPERATIVE VASCULAR EVALUATION Patient Name:  MASAMI SAMSON  Date of Exam:   12/13/2022 Medical Rec #: [id number]          Accession #:    [id number] Date of Birth: [birth year]          Patient Gender: M Patient Age:   31 years Exam Location:  [facility] Procedure:      VAS US DOPPLER PRE VAD Referring Phys: Hebert Soho --------------------------------------------------------------------------------  Indications:  Pre-op LVAD evaluation. Risk Factors:     Hypertension, Diabetes, current smoker. Comparison Study: Previous carotid 09/12/05.                   Previous ABI 11/07/21. Performing Technologist: McKayla Maag RVT, VT  Examination Guidelines: A complete evaluation includes B-mode imaging, spectral Doppler, color Doppler, and power Doppler as needed of all accessible portions of each vessel. Bilateral testing is considered an integral part of a complete examination. Limited examinations for reoccurring indications may be performed as noted.  Right Carotid Findings: +----------+--------+--------+--------+--------+--------+           PSV cm/sEDV cm/sStenosisDescribeComments +----------+--------+--------+--------+--------+--------+ CCA Prox  61      7                                +----------+--------+--------+--------+--------+--------+ CCA Distal44      13                               +----------+--------+--------+--------+--------+--------+ ICA Prox  28      11      1-39%                    +----------+--------+--------+--------+--------+--------+ ICA Mid   43      17                               +----------+--------+--------+--------+--------+--------+ ICA Distal51      19                               +----------+--------+--------+--------+--------+--------+ ECA       51      9                                +----------+--------+--------+--------+--------+--------+  +----------+--------+-------+----------------+------------+           PSV cm/sEDV cmsDescribe        Arm Pressure +----------+--------+-------+----------------+------------+ Subclavian69             Multiphasic, [id number]          +----------+--------+-------+----------------+------------+ +---------+--------+--+--------+--+---------+ VertebralPSV cm/s28EDV cm/s11Antegrade +---------+--------+--+--------+--+---------+ Left Carotid Findings: +----------+--------+--------+--------+--------+--------+           PSV cm/sEDV cm/sStenosisDescribeComments +----------+--------+--------+--------+--------+--------+ CCA Prox  83      18                               +----------+--------+--------+--------+--------+--------+ CCA Distal47      16                               +----------+--------+--------+--------+--------+--------+ ICA Prox  35      17      1-39%                    +----------+--------+--------+--------+--------+--------+ ICA Mid   48      20                               +----------+--------+--------+--------+--------+--------+ ICA Distal62      21                               +----------+--------+--------+--------+--------+--------+  ECA       55      13                               +----------+--------+--------+--------+--------+--------+ +----------+--------+--------+----------------+------------+ SubclavianPSV cm/sEDV cm/sDescribe        Arm Pressure +----------+--------+--------+----------------+------------+           74              Multiphasic, WNL105          +----------+--------+--------+----------------+------------+ +---------+--------+--+--------+--+---------+ VertebralPSV cm/s41EDV cm/s16Antegrade +---------+--------+--+--------+--+---------+  ABI Findings: +---------+------------------+-----+----------+--------+ Right    Rt Pressure (mmHg)IndexWaveform  Comment   +---------+------------------+-----+----------+--------+ Brachial 103                    triphasic          +---------+------------------+-----+----------+--------+ PTA      92                0.88 monophasic         +---------+------------------+-----+----------+--------+ DP       79                0.75 monophasic         +---------+------------------+-----+----------+--------+ Pollie Meyer                     Abnormal           +---------+------------------+-----+----------+--------+ +---------+------------------+-----+----------+-------+ Left     Lt Pressure (mmHg)IndexWaveform  Comment +---------+------------------+-----+----------+-------+ Brachial 105                    triphasic         +---------+------------------+-----+----------+-------+ PTA      73                0.70 monophasic        +---------+------------------+-----+----------+-------+ DP       77                0.73 biphasic          +---------+------------------+-----+----------+-------+ Geraldine Solar                     Abnormal          +---------+------------------+-----+----------+-------+ +-------+---------------+----------------+ ABI/TBIToday's ABI/TBIPrevious ABI/TBI +-------+---------------+----------------+ Right  0.88           0.64             +-------+---------------+----------------+ Left   0.73           0.75             +-------+---------------+----------------+  Summary: Right Carotid: Velocities in the right ICA are consistent with a 1-39% stenosis. Left Carotid: Velocities in the left ICA are consistent with a 1-39% stenosis. Vertebrals:  Bilateral vertebral arteries demonstrate antegrade flow. Subclavians: Normal flow hemodynamics were seen in bilateral subclavian              arteries.  *See table(s) above for measurements and observations. Right ABI: Resting right ankle-brachial index indicates mild right lower extremity arterial disease. The right toe-brachial  index is abnormal. Left ABI: Resting left ankle-brachial index indicates moderate left lower extremity arterial disease. The left toe-brachial index is abnormal.     Preliminary    VAS [name] LOWER EXTREMITY VENOUS (DVT)  Result Date: 12/13/2022  Lower Venous DVT Study Patient Name:  ELVER GIACONA  Date of Exam:   12/13/2022 Medical Rec #: [id number]  Accession #:    [id number] Date of Birth: [birth year]          Patient Gender: M Patient Age:   46 years Exam Location:  [facility] Procedure:      VAS [name] LOWER EXTREMITY VENOUS (DVT) Referring Phys: Hebert Soho --------------------------------------------------------------------------------  Indications: Pre-op LVAD evaluation.  Comparison Study: Previous 05/20/20 negative. Performing Technologist: McKayla Maag RVT, VT  Examination Guidelines: A complete evaluation includes B-mode imaging, spectral Doppler, color Doppler, and power Doppler as needed of all accessible portions of each vessel. Bilateral testing is considered an integral part of a complete examination. Limited examinations for reoccurring indications may be performed as noted. The reflux portion of the exam is performed with the patient in reverse Trendelenburg.  +---------+---------------+---------+-----------+----------+--------------+ RIGHT    CompressibilityPhasicitySpontaneityPropertiesThrombus Aging +---------+---------------+---------+-----------+----------+--------------+ CFV      Full           Yes      Yes                                 +---------+---------------+---------+-----------+----------+--------------+ SFJ      Full                                                        +---------+---------------+---------+-----------+----------+--------------+ FV Prox  Full                                                        +---------+---------------+---------+-----------+----------+--------------+ FV Mid   Full                                                         +---------+---------------+---------+-----------+----------+--------------+ FV DistalFull                                                        +---------+---------------+---------+-----------+----------+--------------+ PFV      Full                                                        +---------+---------------+---------+-----------+----------+--------------+ POP      Full           Yes      Yes                                 +---------+---------------+---------+-----------+----------+--------------+ PTV      Full                                                        +---------+---------------+---------+-----------+----------+--------------+  PERO     Full                                                        +---------+---------------+---------+-----------+----------+--------------+   +---------+---------------+---------+-----------+----------+--------------+ LEFT     CompressibilityPhasicitySpontaneityPropertiesThrombus Aging +---------+---------------+---------+-----------+----------+--------------+ CFV      Full           Yes      Yes                                 +---------+---------------+---------+-----------+----------+--------------+ SFJ      Full                                                        +---------+---------------+---------+-----------+----------+--------------+ FV Prox  Full                                                        +---------+---------------+---------+-----------+----------+--------------+ FV Mid   Full                                                        +---------+---------------+---------+-----------+----------+--------------+ FV DistalFull                                                        +---------+---------------+---------+-----------+----------+--------------+ PFV      Full                                                         +---------+---------------+---------+-----------+----------+--------------+ POP      Full           Yes      Yes                                 +---------+---------------+---------+-----------+----------+--------------+ PTV      Full                                                        +---------+---------------+---------+-----------+----------+--------------+ PERO     Full                                                        +---------+---------------+---------+-----------+----------+--------------+  Summary: BILATERAL: - No evidence of deep vein thrombosis seen in the lower extremities, bilaterally. - No evidence of superficial venous thrombosis in the lower extremities, bilaterally. -No evidence of popliteal cyst, bilaterally.   *See table(s) above for measurements and observations.    Preliminary    CUP PACEART INCLINIC DEVICE CHECK  Result Date: 11/27/2022 ICD check in clinic. Normal device function. Thresholds and sensing consistent with previous device measurements. Impedance trends stable over time. Afib noted. Nonsustained ventricular arrhythmias noted-no therapies. Histogram distribution appropriate for patient and level of activity. No changes made this session. Device programmed at appropriate safety margins. Device programmed to optimize intrinsic conduction. Estimated longevity 100% battery. Pt enrolled in remote follow-up. Patient education completed including shock plan. Auditory/vibratory alert demonstrated.Myrtie Hawk, BSN, RN  CUP PACEART REMOTE DEVICE CHECK  Result Date: 11/20/2022 Scheduled remote reviewed. Normal device function.  13 NSVT, no therapy 11 atrial monitoring episodes, available EGM's show AF/AFL longest duration 40mn, mean HR 138, burden 0%, ASA only - route to triage Next remote 91 days. LA      I have independently reviewed the above radiology studies  and reviewed the findings with the patient.   Recent Lab Findings: Lab Results   Component Value Date   WBC 6.8 11/23/2022   HGB 18.5 (H) 11/23/2022   HCT 53.5 (H) 11/23/2022   PLT 144 (L) 11/23/2022   GLUCOSE 118 (H) 11/23/2022   CHOL 237 (H) 11/23/2022   TRIG 86 11/23/2022   HDL 49 11/23/2022   LDLCALC 171 (H) 11/23/2022   ALT 19 11/23/2022   AST 19 11/23/2022   NA 135 11/23/2022   K 4.1 11/23/2022   CL 101 11/23/2022   CREATININE 1.30 (H) 11/23/2022   BUN 23 11/23/2022   CO2 23 11/23/2022   TSH 1.852 11/23/2022   INR 1.1 11/23/2022   HGBA1C 6.6 (H) 11/23/2022   Jan 2024 RHC  Conclusion  HEMODYNAMICS: RA:                  6 mmHg (mean) RV:                  71/6-8 mmHg PA:                  74/31 mmHg (48 mean) PCWP:            28 mmHg (mean)                                      Estimated Fick CO/CI   3.8 L/min, 1.6 L/min/m2 Thermodilution CO/CI   4.6 L/min, 1.9 L/min/m2                                              TPG                 20  mmHg                                            PVR                 5.3-4.3 [employer] Pacific  7     NIBP: 121/84     IMPRESSION: 1.Mildly elevated RA pressure.  2.Moderately elevated PA mean & PVR consistent with combined pre and post capillary PH; although, I believe a large component of his PH is driven by long term group II.  3.Normal PAPi and [id number] indicative of optimal RV function.  4.Severely elevated post-capillary filling pressures.  5.Severely reduced cardiac output / cardiac index by thermodilution & Fick.     RECOMMENDATIONS: Start evaluation for advanced therapies.      Assessment / Plan:   34M with NICM undergoing LVAD evaluation.  EF < 25% and symptomatic despite GDMT.  By echo, LVIDd:  5.20 cm, LVIDs: 4.80 cm. No sig AI, PFO or TR By RHC, PAsystolic [id number] and PVR > 4 For labs Cr was up to 1.6, now down to 1.3.   Given high PVR, not a candidate for transplant at this time. Maybe be a candidate in the future at some point, depending on extent of LV unloading we achieve.    Operative considerations: Aortic finding around RCA in the aortic root is on element of this case. Will evaluate intraoperatively as well.  LVIDD is 5.2cm. Typically good to see this value >5.5cm. We can perform LVAD down to 4.8cm, however, this increases rate of suction events and cannula inflow issues / apparent malposition due to small LV area.   Risks/benefits/alternatives were discussed at length (80% straightforward recovery, 15% morbidity [any organ, bleeding, infection, stroke, long ICU and hospital stay due to any organ], 5% mortality). Straightforward recovery typically entails 5-10 ICU days, 7-10 days on the floor, seeing me back in clinic at 1 week and 1 month postoperatively. No lifting greater than 10 lbs for 6 weeks. Total recovery expected by 2-3 months. All questions were asked and answered.   I  spent 60 minutes with  the patient face to face in counseling and coordination of care.    Pierre Bali Daanya Lanphier 12/13/2022 3:08 PM       S/P MRB  We discussed PSA is elevated and seen on CT - see urology  We discussed his aorta. Not felt to intervene at time. Plan TEE/RHC when preadmit for surgery and can baseline. Doesn't need MR or CT before VAD.  We discussed his Echo LVIDD 5.2cm. Long heart. Should be fine for VAD.   We discussed that he would wait a long time for heart transplant given 6'6" and blood type O, on top of fact that he is still smoking and PVR is prohibitive.    Needs urology and dental and preop TEE/RHC.

## 2022-12-14 LAB — VAS US DOPPLER PRE VAD
Left ABI: 0.73
Right ABI: 0.88

## 2022-12-16 ENCOUNTER — Ambulatory Visit (HOSPITAL_BASED_OUTPATIENT_CLINIC_OR_DEPARTMENT_OTHER)
Admission: RE | Admit: 2022-12-16 | Discharge: 2022-12-16 | Disposition: A | Discharge status: Home / Self Care | Payer: BC Managed Care – PPO | Source: Ambulatory Visit | Attending: Cardiology | Admitting: Cardiology

## 2022-12-16 DIAGNOSIS — R918 Other nonspecific abnormal finding of lung field: Secondary | ICD-10-CM | POA: Diagnosis not present

## 2022-12-16 DIAGNOSIS — I428 Other cardiomyopathies: Secondary | ICD-10-CM | POA: Diagnosis not present

## 2022-12-16 DIAGNOSIS — K4090 Unilateral inguinal hernia, without obstruction or gangrene, not specified as recurrent: Secondary | ICD-10-CM | POA: Diagnosis not present

## 2022-12-18 ENCOUNTER — Telehealth: Payer: Self-pay | Admitting: *Deleted

## 2022-12-18 ENCOUNTER — Other Ambulatory Visit (HOSPITAL_COMMUNITY): Payer: Self-pay

## 2022-12-18 ENCOUNTER — Encounter (HOSPITAL_COMMUNITY): Payer: Self-pay

## 2022-12-18 DIAGNOSIS — I5022 Chronic systolic (congestive) heart failure: Secondary | ICD-10-CM

## 2022-12-18 DIAGNOSIS — I5020 Unspecified systolic (congestive) heart failure: Secondary | ICD-10-CM

## 2022-12-18 DIAGNOSIS — R972 Elevated prostate specific antigen [PSA]: Secondary | ICD-10-CM

## 2022-12-18 MED ORDER — SODIUM CHLORIDE 0.9% FLUSH: 3.0000 mL | Freq: Two times a day (BID) | INTRAVENOUS | Status: AC

## 2022-12-18 NOTE — Progress Notes (Signed)
Patient scheduled for RHC and TEE Friday 12/21/22 per Dr. Daniel Nones. Patient instructed to check in at admitting at 0830 and nothing to eat or drink after midnight 12/20/22. Patient verbalizes understanding of instructions.  Bobbye Morton RN, BSN VAD Coordinator 24/7 Pager [phone]

## 2022-12-21 ENCOUNTER — Ambulatory Visit (HOSPITAL_COMMUNITY)
Admission: RE | Admit: 2022-12-21 | Discharge: 2022-12-21 | Disposition: A | Discharge status: Home / Self Care | Payer: BC Managed Care – PPO | Source: Ambulatory Visit | Attending: Cardiology | Admitting: Cardiology

## 2022-12-21 ENCOUNTER — Encounter (HOSPITAL_COMMUNITY): Payer: Self-pay | Admitting: Cardiology

## 2022-12-21 ENCOUNTER — Encounter (HOSPITAL_COMMUNITY)
Admission: RE | Disposition: A | Discharge status: Home / Self Care | Payer: Self-pay | Source: Ambulatory Visit | Attending: Cardiology

## 2022-12-21 ENCOUNTER — Ambulatory Visit (HOSPITAL_COMMUNITY): Payer: BC Managed Care – PPO | Admitting: Anesthesiology

## 2022-12-21 ENCOUNTER — Ambulatory Visit (HOSPITAL_BASED_OUTPATIENT_CLINIC_OR_DEPARTMENT_OTHER): Payer: BC Managed Care – PPO

## 2022-12-21 ENCOUNTER — Other Ambulatory Visit: Payer: Self-pay

## 2022-12-21 DIAGNOSIS — N182 Chronic kidney disease, stage 2 (mild): Secondary | ICD-10-CM | POA: Diagnosis not present

## 2022-12-21 DIAGNOSIS — I428 Other cardiomyopathies: Secondary | ICD-10-CM | POA: Diagnosis not present

## 2022-12-21 DIAGNOSIS — Q2543 Congenital aneurysm of aorta: Secondary | ICD-10-CM | POA: Diagnosis not present

## 2022-12-21 DIAGNOSIS — I5022 Chronic systolic (congestive) heart failure: Secondary | ICD-10-CM | POA: Insufficient documentation

## 2022-12-21 DIAGNOSIS — I7121 Aneurysm of the ascending aorta, without rupture: Secondary | ICD-10-CM

## 2022-12-21 DIAGNOSIS — I509 Heart failure, unspecified: Secondary | ICD-10-CM

## 2022-12-21 DIAGNOSIS — R0609 Other forms of dyspnea: Secondary | ICD-10-CM | POA: Diagnosis not present

## 2022-12-21 DIAGNOSIS — I272 Pulmonary hypertension, unspecified: Secondary | ICD-10-CM | POA: Diagnosis not present

## 2022-12-21 DIAGNOSIS — I130 Hypertensive heart and chronic kidney disease with heart failure and stage 1 through stage 4 chronic kidney disease, or unspecified chronic kidney disease: Secondary | ICD-10-CM | POA: Insufficient documentation

## 2022-12-21 DIAGNOSIS — Z7984 Long term (current) use of oral hypoglycemic drugs: Secondary | ICD-10-CM | POA: Diagnosis not present

## 2022-12-21 DIAGNOSIS — E1122 Type 2 diabetes mellitus with diabetic chronic kidney disease: Secondary | ICD-10-CM | POA: Diagnosis not present

## 2022-12-21 DIAGNOSIS — Z95810 Presence of automatic (implantable) cardiac defibrillator: Secondary | ICD-10-CM | POA: Diagnosis not present

## 2022-12-21 DIAGNOSIS — N1831 Chronic kidney disease, stage 3a: Secondary | ICD-10-CM

## 2022-12-21 DIAGNOSIS — F17210 Nicotine dependence, cigarettes, uncomplicated: Secondary | ICD-10-CM | POA: Insufficient documentation

## 2022-12-21 DIAGNOSIS — Z79899 Other long term (current) drug therapy: Secondary | ICD-10-CM | POA: Insufficient documentation

## 2022-12-21 DIAGNOSIS — I5020 Unspecified systolic (congestive) heart failure: Secondary | ICD-10-CM

## 2022-12-21 HISTORY — PX: RIGHT HEART CATH: CATH118263

## 2022-12-21 HISTORY — PX: TEE WITHOUT CARDIOVERSION: SHX5443

## 2022-12-21 LAB — POCT I-STAT EG7
Acid-base deficit: 1 mmol/L (ref 0.0–2.0)
Acid-base deficit: 1 mmol/L (ref 0.0–2.0)
Bicarbonate: 24.4 mmol/L (ref 20.0–28.0)
Bicarbonate: 25 mmol/L (ref 20.0–28.0)
Calcium, Ion: 1.21 mmol/L (ref 1.15–1.40)
Calcium, Ion: 1.23 mmol/L (ref 1.15–1.40)
HCT: 47 % (ref 39.0–52.0)
HCT: 48 % (ref 39.0–52.0)
Hemoglobin: 16 g/dL (ref 13.0–17.0)
Hemoglobin: 16.3 g/dL (ref 13.0–17.0)
O2 Saturation: 59 %
O2 Saturation: 59 %
Potassium: 3.8 mmol/L (ref 3.5–5.1)
Potassium: 3.9 mmol/L (ref 3.5–5.1)
Sodium: 141 mmol/L (ref 135–145)
Sodium: 142 mmol/L (ref 135–145)
TCO2: 26 mmol/L (ref 22–32)
TCO2: 26 mmol/L (ref 22–32)
pCO2, Ven: 43.2 mmHg — ABNORMAL LOW (ref 44–60)
pCO2, Ven: 44.8 mmHg (ref 44–60)
pH, Ven: 7.355 (ref 7.25–7.43)
pH, Ven: 7.36 (ref 7.25–7.43)
pO2, Ven: 32 mmHg (ref 32–45)
pO2, Ven: 32 mmHg (ref 32–45)

## 2022-12-21 LAB — GLUCOSE, CAPILLARY
Glucose-Capillary: 109 mg/dL — ABNORMAL HIGH (ref 70–99)
Glucose-Capillary: 90 mg/dL (ref 70–99)

## 2022-12-21 SURGERY — RIGHT HEART CATH
Anesthesia: LOCAL

## 2022-12-21 SURGERY — ECHOCARDIOGRAM, TRANSESOPHAGEAL
Anesthesia: Monitor Anesthesia Care

## 2022-12-21 MED ORDER — SODIUM CHLORIDE 0.9% FLUSH: 3.0000 mL | INTRAVENOUS | Status: DC | PRN

## 2022-12-21 MED ORDER — PROPOFOL 10 MG/ML IV BOLUS
INTRAVENOUS | Status: DC | PRN
  Administered 2022-12-21: 40 mg via INTRAVENOUS
  Administered 2022-12-21: 30 mg via INTRAVENOUS

## 2022-12-21 MED ORDER — KETAMINE HCL 10 MG/ML IJ SOLN
INTRAMUSCULAR | Status: DC | PRN
  Administered 2022-12-21: 20 mg via INTRAVENOUS
  Administered 2022-12-21: 30 mg via INTRAVENOUS

## 2022-12-21 MED ORDER — SODIUM CHLORIDE 0.9 % IV SOLN: INTRAVENOUS | Status: DC

## 2022-12-21 MED ORDER — SODIUM CHLORIDE 0.9 % IV SOLN: 250.0000 mL | INTRAVENOUS | Status: DC | PRN

## 2022-12-21 MED ORDER — LIDOCAINE 2% (20 MG/ML) 5 ML SYRINGE
INTRAMUSCULAR | Status: DC | PRN
  Administered 2022-12-21: 100 mg via INTRAVENOUS
  Administered 2022-12-21: 40 mg via INTRAVENOUS

## 2022-12-21 MED ORDER — LIDOCAINE HCL (PF) 1 % IJ SOLN
INTRAMUSCULAR | Status: AC
  Filled 2022-12-21: qty 30

## 2022-12-21 MED ORDER — KETAMINE HCL 50 MG/5ML IJ SOSY
PREFILLED_SYRINGE | INTRAMUSCULAR | Status: AC
  Filled 2022-12-21: qty 5

## 2022-12-21 MED ORDER — HEPARIN (PORCINE) IN NACL 1000-0.9 UT/500ML-% IV SOLN
INTRAVENOUS | Status: DC | PRN
  Administered 2022-12-21: 500 mL

## 2022-12-21 MED ORDER — PROPOFOL 500 MG/50ML IV EMUL
INTRAVENOUS | Status: DC | PRN
  Administered 2022-12-21: 120 ug/kg/min via INTRAVENOUS

## 2022-12-21 MED ORDER — LIDOCAINE HCL (PF) 1 % IJ SOLN
INTRAMUSCULAR | Status: DC | PRN
  Administered 2022-12-21: 2 mL
  Administered 2022-12-21: 5 mL

## 2022-12-21 MED ORDER — GLYCOPYRROLATE 0.2 MG/ML IJ SOLN
INTRAMUSCULAR | Status: DC | PRN
  Administered 2022-12-21: .2 mg via INTRAVENOUS

## 2022-12-21 SURGICAL SUPPLY — 6 items
CATH SWAN GANZ 7F STRAIGHT (CATHETERS) IMPLANT
GLIDESHEATH SLENDER 7FR .021G (SHEATH) IMPLANT
PACK CARDIAC CATHETERIZATION (CUSTOM PROCEDURE TRAY) ×1 IMPLANT
TRANSDUCER W/STOPCOCK (MISCELLANEOUS) ×1 IMPLANT
TUBING ART PRESS 72  MALE/FEM (TUBING) ×1
TUBING ART PRESS 72 MALE/FEM (TUBING) IMPLANT

## 2022-12-21 NOTE — Progress Notes (Signed)
Echocardiogram Echocardiogram Transesophageal has been performed.  Fidel Levy 12/21/2022, 1:36 PM

## 2022-12-21 NOTE — Discharge Instructions (Addendum)
TEE  YOU HAD AN CARDIAC PROCEDURE TODAY: Refer to the procedure report and other information in the discharge instructions given to you for any specific questions about what was found during the examination. If this information does not answer your questions, please call [place] office at [phone] to clarify.   DIET: Your first meal following the procedure should be a light meal and then it is ok to progress to your normal diet. A half-sandwich or bowl of soup is an example of a good first meal. Heavy or fried foods are harder to digest and may make you feel nauseous or bloated. Drink plenty of fluids but you should avoid alcoholic beverages for 24 hours. If you had a esophageal dilation, please see attached instructions for diet.   ACTIVITY: Your care partner should take you home directly after the procedure. You should plan to take it easy, moving slowly for the rest of the day. You can resume normal activity the day after the procedure however YOU SHOULD NOT DRIVE, use power tools, machinery or perform tasks that involve climbing or major physical exertion for 24 hours (because of the sedation medicines used during the test).   SYMPTOMS TO REPORT IMMEDIATELY: A cardiologist can be reached at any hour. Please call [phone] for any of the following symptoms:  Vomiting of blood or coffee ground material  New, significant abdominal pain  New, significant chest pain or pain under the shoulder blades  Painful or persistently difficult swallowing  New shortness of breath  Black, tarry-looking or red, bloody stools  FOLLOW UP:  Please also call with any specific questions about appointments or follow up tests. Activity Rest as told by your health care provider. Return to your normal activities as told by your health care provider. Ask your health care provider what activities are safe for you. May shower and remove bandage 24 hours after discharge. If you were given a sedative during  the procedure, it can affect you for several hours. Do not drive or operate machinery until your health care provider says that it is safe. General instructions      Check your IV insertion area every day for signs of infection. Check for: Redness, swelling, or pain. Fluid or blood. Warmth. Pus or a bad smell. Take over-the-counter and prescription medicines only as told by your health care provider. Contact a health care provider if: You have redness, swelling, warmth, pus, or pain around the IV insertion site.

## 2022-12-21 NOTE — Transfer of Care (Signed)
Immediate Anesthesia Transfer of Care Note  Patient: James [name]  Procedure(s) Performed: TRANSESOPHAGEAL ECHOCARDIOGRAM (TEE)  Patient Location: PACU  Anesthesia Type:MAC  Level of Consciousness: awake, alert , and oriented  Airway & Oxygen Therapy: Patient Spontanous Breathing  Post-op Assessment: Report given to RN and Post -op Vital signs reviewed and stable  Post vital signs: Reviewed and stable  Last Vitals:  Vitals Value Taken Time  BP    Temp    Pulse 67 12/21/22 1334  Resp 18 12/21/22 1334  SpO2 100 % 12/21/22 1334  Vitals shown include unvalidated device data.  Last Pain:  Vitals:   12/21/22 1211  TempSrc: Temporal  PainSc: 0-No pain         Complications: No notable events documented.

## 2022-12-21 NOTE — CV Procedure (Signed)
   TRANSESOPHAGEAL ECHOCARDIOGRAM GUIDED DIRECT CURRENT CARDIOVERSION  NAME:  James [name]    MRN: [id number] DOB:  1961-02-11    ADMIT DATE: 12/21/2022  INDICATIONS: LVAD evaluation Aortic root aneurysm  PROCEDURE:   Informed consent was obtained prior to the procedure. The risks, benefits and alternatives for the procedure were discussed and the patient comprehended these risks.  Risks include, but are not limited to, cough, sore throat, vomiting, nausea, somnolence, esophageal and stomach trauma or perforation, bleeding, low blood pressure, aspiration, pneumonia, infection, trauma to the teeth and death.    After a procedural time-out, the oropharynx was anesthetized and the patient was sedated by the anesthesia service. The transesophageal probe was inserted in the esophagus and stomach without difficulty and multiple views were obtained. Sedation by anesthesia.   COMPLICATIONS:    Complications: No complications Patient tolerated procedure well.  KEY FINDINGS:  Severely reduced LV function (EF<25%) Normal RV function.  No valvular pathology.  Dilated sinus of Valsalva consistent with aneurysm seen by CT.  No aortic regurgitation.    Christhoper Busbee Advanced Heart Failure 1:44 PM

## 2022-12-21 NOTE — Anesthesia Postprocedure Evaluation (Signed)
Anesthesia Post Note  Patient: James [name]  Procedure(s) Performed: TRANSESOPHAGEAL ECHOCARDIOGRAM (TEE)     Patient location during evaluation: PACU Anesthesia Type: MAC Level of consciousness: awake Pain management: pain level controlled Vital Signs Assessment: post-procedure vital signs reviewed and stable Respiratory status: spontaneous breathing, nonlabored ventilation and respiratory function stable Cardiovascular status: stable and blood pressure returned to baseline Postop Assessment: no apparent nausea or vomiting Anesthetic complications: no   No notable events documented.  Last Vitals:  Vitals:   12/21/22 1350 12/21/22 1401  BP: 125/84 (!) 122/93  Pulse: 72 66  Resp: (!) 26 13  Temp:    SpO2: 100% 99%    Last Pain:  Vitals:   12/21/22 1337  TempSrc:   PainSc: 0-No pain                 Nilda Simmer

## 2022-12-21 NOTE — Interval H&P Note (Signed)
History and Physical Interval Note:  12/21/2022 9:57 AM  James [name]  has presented today for surgery, with the diagnosis of heart failure.  The various methods of treatment have been discussed with the patient and family. After consideration of risks, benefits and other options for treatment, the patient has consented to  Procedure(s): RIGHT HEART CATH (N/A) as a surgical intervention.  The patient's history has been reviewed, patient examined, no change in status, stable for surgery.  I have reviewed the patient's chart and labs.  Questions were answered to the patient's satisfaction.     Earlisha Sharples

## 2022-12-21 NOTE — Anesthesia Preprocedure Evaluation (Addendum)
Anesthesia Evaluation  Patient identified by MRN, date of birth, ID band Patient awake    Reviewed: Allergy & Precautions, NPO status , Patient's Chart, lab work & pertinent test results  History of Anesthesia Complications Negative for: history of anesthetic complications  Airway Mallampati: III  TM Distance: >3 FB Neck ROM: Full    Dental  (+) Dental Advisory Given   Pulmonary neg shortness of breath, asthma , sleep apnea , neg COPD, neg recent URI, Patient abstained from smoking., former smoker   Pulmonary exam normal breath sounds clear to auscultation       Cardiovascular hypertension (carvedilol, Entresto, spironolactone), Pt. on home beta blockers and Pt. on medications pulmonary hypertension(-) angina +CHF  (-) Past MI, (-) Cardiac Stents and (-) CABG + dysrhythmias (PVCs) + Cardiac Defibrillator  Rhythm:Regular Rate:Normal  HLD, dilated aortic root  RHC 12/21/2022: IMPRESSION: 1. Moderately elevated pre and post capillary filling pressures.  2. 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  3. Severely reduced cardiac output / cardiac index by Fick & Thermodilution (TD likely more reflective of true output).   TTE 10/22/2022: IMPRESSIONS     1. Left ventricular ejection fraction, by estimation, is <20%. The left  ventricle has severely decreased function. The left ventricle demonstrates  global hypokinesis. The left ventricular internal cavity size was  moderately dilated. There is mild  asymmetric left ventricular hypertrophy of the basal-septal segment. Left  ventricular diastolic parameters are consistent with Grade III diastolic  dysfunction (restrictive). Elevated left atrial pressure.   2. Right ventricular systolic function is mildly reduced. The right  ventricular size is normal. Tricuspid regurgitation signal is inadequate  for assessing PA pressure.   3. Left  atrial size was moderately dilated.   4. The mitral valve is normal in structure. Trivial mitral valve  regurgitation.   5. The aortic valve is tricuspid. Aortic valve regurgitation is not  visualized. No aortic stenosis is present.   6. Aortic dilatation noted. Aneurysm of the aortic root, measuring 48 mm.   7. The inferior vena cava is normal in size with greater than 50%  respiratory variability, suggesting right atrial pressure of 3 mmHg.      Neuro/Psych negative neurological ROS     GI/Hepatic negative GI ROS, Neg liver ROS,,,  Endo/Other  diabetes, Type 2, Oral Hypoglycemic Agents    Renal/GU CRFRenal disease     Musculoskeletal   Abdominal   Peds  Hematology negative hematology ROS (+)   Anesthesia Other Findings 51M with NICM undergoing LVAD evaluation.  Reproductive/Obstetrics                             Anesthesia Physical Anesthesia Plan  ASA: 4  Anesthesia Plan: MAC   Post-op Pain Management:    Induction: Intravenous  PONV Risk Score and Plan: 1 and Propofol infusion and Treatment may vary due to age or medical condition  Airway Management Planned: Natural Airway and Nasal Cannula  Additional Equipment: ClearSight  Intra-op Plan:   Post-operative Plan:   Informed Consent: I have reviewed the patients History and Physical, chart, labs and discussed the procedure including the risks, benefits and alternatives for the proposed anesthesia with the patient or authorized representative who has indicated his/her understanding and acceptance.     Dental advisory given  Plan Discussed with: CRNA and Anesthesiologist  Anesthesia Plan Comments: (Discussed with patient risks of MAC including, but not limited to,  minor pain or discomfort, hearing people in the room, and possible need for backup general anesthesia. Risks for general anesthesia also discussed including, but not limited to, sore throat, hoarse voice, chipped/damaged  teeth, injury to vocal cords, nausea and vomiting, allergic reactions, lung infection, heart attack, stroke, and death. All questions answered. )       Anesthesia Quick Evaluation

## 2022-12-24 ENCOUNTER — Encounter (HOSPITAL_COMMUNITY): Payer: Self-pay | Admitting: Cardiology

## 2022-12-25 ENCOUNTER — Encounter (HOSPITAL_COMMUNITY): Payer: Self-pay | Admitting: Cardiology

## 2022-12-27 ENCOUNTER — Telehealth (HOSPITAL_COMMUNITY): Payer: Self-pay | Admitting: Licensed Clinical Social Worker

## 2022-12-27 NOTE — Telephone Encounter (Signed)
CSW contacted patient to follow up on completing the VAD Assessment process. Patient states he and his wife would be able to come to clinic on 12-31-22 at 11am to complete assessment. Raquel Sarna, [place], [place]

## 2022-12-28 ENCOUNTER — Ambulatory Visit (HOSPITAL_COMMUNITY): Payer: BC Managed Care – PPO | Attending: Cardiology

## 2022-12-28 DIAGNOSIS — E1122 Type 2 diabetes mellitus with diabetic chronic kidney disease: Secondary | ICD-10-CM | POA: Insufficient documentation

## 2022-12-28 DIAGNOSIS — I130 Hypertensive heart and chronic kidney disease with heart failure and stage 1 through stage 4 chronic kidney disease, or unspecified chronic kidney disease: Secondary | ICD-10-CM | POA: Insufficient documentation

## 2022-12-28 DIAGNOSIS — I5022 Chronic systolic (congestive) heart failure: Secondary | ICD-10-CM | POA: Diagnosis not present

## 2022-12-28 DIAGNOSIS — I428 Other cardiomyopathies: Secondary | ICD-10-CM | POA: Diagnosis not present

## 2022-12-28 DIAGNOSIS — N1831 Chronic kidney disease, stage 3a: Secondary | ICD-10-CM | POA: Diagnosis not present

## 2022-12-28 NOTE — Progress Notes (Signed)
Remote ICD transmission.   

## 2023-01-01 ENCOUNTER — Other Ambulatory Visit (HOSPITAL_COMMUNITY): Payer: Self-pay | Admitting: *Deleted

## 2023-01-01 DIAGNOSIS — I5022 Chronic systolic (congestive) heart failure: Secondary | ICD-10-CM | POA: Diagnosis not present

## 2023-01-01 LAB — ECHO TEE

## 2023-01-02 ENCOUNTER — Other Ambulatory Visit (HOSPITAL_COMMUNITY): Payer: Self-pay | Admitting: Internal Medicine

## 2023-01-03 ENCOUNTER — Ambulatory Visit: Payer: BC Managed Care – PPO | Admitting: Urology

## 2023-01-03 NOTE — Progress Notes (Deleted)
Subjective: No diagnosis found.   Consult requested by Marland Kitchen MD.  Mr. Fansler is a 62 yo male who is referred for evaluation of an elevated PSA of 7.0 on 11/23/22.   No prior PSA's are available.   He had a CT AP in 2022 and his prostate volume on that study is about 38ml.  The prostate size was stable on a CT CAP on 12/16/22 and no upper tract abnormalities were noted.  He does have an LIH.  He has a history of DM, CKD and CAD with CHF and an Aortic root aneurysm.  His Cr on 11/23/22 was 1.3.  He had coronary cath and TEE on 12/21/22.  He had been admitted for CHF on 10/22/22.  ROS:  ROS  No Known Allergies  Past Medical History:  Diagnosis Date   CKD (chronic kidney disease) stage 2, GFR 60-89 ml/min    Diabetes mellitus type II, non insulin dependent (HCC)    Dilated aortic root (HCC)    Hypercholesteremia    Hypertension    NICM (nonischemic cardiomyopathy) (HCC)    NSVT (nonsustained ventricular tachycardia) (HCC)    Obstructive sleep apnea    was positive but never given a CPAP   Pulmonary hypertension ([place])    PVC's (premature ventricular contractions)    Tobacco abuse     Past Surgical History:  Procedure Laterality Date   CARDIAC CATHETERIZATION  2002   COLONOSCOPY N/A 03/22/2014   Procedure: COLONOSCOPY;  Surgeon: Danie Binder, MD;  [facility];  Service: Endoscopy;  Laterality: N/A;  11:15 AM   ICD IMPLANT N/A 08/21/2021   Procedure: ICD IMPLANT;  Surgeon: Evans Lance, MD;  Location: [place] [facility];  Service: Cardiovascular;  Laterality: N/A;   INGUINAL HERNIA REPAIR Right 06/24/2017   Procedure: HERNIA REPAIR INGUINAL ADULT WITH MESH;  Surgeon: Aviva Signs, MD;  Location: [facility];  Service: General;  Laterality: Right;   RIGHT HEART CATH N/A 11/13/2022   Procedure: RIGHT HEART CATH;  Surgeon: Hebert Soho, DO;  Location: [place] [facility];  Service: Cardiovascular;  Laterality: N/A;   RIGHT HEART CATH N/A 12/21/2022   Procedure: RIGHT HEART  CATH;  Surgeon: Hebert Soho, DO;  Location: [place] [facility];  Service: Cardiovascular;  Laterality: N/A;   RIGHT/LEFT HEART CATH AND CORONARY ANGIOGRAPHY N/A 07/19/2021   Procedure: RIGHT/LEFT HEART CATH AND CORONARY ANGIOGRAPHY;  Surgeon: Burnell Blanks, MD;  Location: [place] [facility];  Service: Cardiovascular;  Laterality: N/A;   TEE WITHOUT CARDIOVERSION N/A 12/21/2022   Procedure: TRANSESOPHAGEAL ECHOCARDIOGRAM (TEE);  Surgeon: Hebert Soho, DO;  Location: [facility];  Service: Cardiovascular;  Laterality: N/A;    Social History   Socioeconomic History   Marital status: Married    Spouse name: Thaine Southers   Number of children: 4   Years of education: Not on file   Highest education level: Associate degree: occupational, Hotel manager, or vocational program  Occupational History   Occupation: supervisor    Comment: [employer]  Tobacco Use   Smoking status: Former    Packs/day: 0.50    Years: 32.00    Additional pack years: 0.00    Total pack years: 16.00    Types: Cigarettes    Start date: 10/15/1982    Quit date: 07/18/2021    Years since quitting: 1.4   Smokeless tobacco: Never   Tobacco comments:    3 cigaretts a day  Vaping Use   Vaping Use: Never used  Substance and Sexual Activity   Alcohol use: No    Alcohol/week: 0.0 standard drinks of alcohol   Drug use: No   Sexual activity: Yes    Birth control/protection: None  Other Topics Concern   Not on file  Social History Narrative   Divorced   No regular exercise   Social Determinants of Health   Financial Resource Strain: Low Risk  (07/21/2021)   Overall Financial Resource Strain (CARDIA)    Difficulty of Paying Living Expenses: Not very hard  Food Insecurity: No Food Insecurity (07/22/2022)   Hunger Vital Sign    Worried About Running Out of Food in the Last Year: Never true    Ran Out of Food in the Last Year: Never true  Transportation Needs: No Transportation Needs (07/22/2022)    PRAPARE - Hydrologist (Medical): No    Lack of Transportation (Non-Medical): No  Physical Activity: Not on file  Stress: Not on file  Social Connections: Not on file  Intimate Partner Violence: Not At Risk (07/22/2022)   Humiliation, Afraid, Rape, and Kick questionnaire    Fear of Current or Ex-Partner: No    Emotionally Abused: No    Physically Abused: No    Sexually Abused: No    Family History  Problem Relation Age of Onset   Coronary artery disease Mother    Diabetes Father    Heart attack Father    Hypertension Father    Iron deficiency Father    Thyroid disease Sister    Cardiomyopathy Brother        No significant coronary disease by coronary angiography   Heart disease Maternal Grandfather        Also paternal grandfather   Sudden death Other    Colon cancer Neg Hx     Anti-infectives: Anti-infectives (From admission, onward)    None       Current Outpatient Medications  Medication Sig Dispense Refill   acetaminophen (TYLENOL) 500 MG tablet Take 1,000 mg by mouth every 6 (six) hours as needed for moderate pain.     albuterol (VENTOLIN HFA) 108 (90 Base) MCG/ACT inhaler Inhale 1-2 puffs into the lungs every 6 (six) hours as needed for wheezing or shortness of breath. 18 g 0   apixaban (ELIQUIS) 5 MG TABS tablet Take 1 tablet (5 mg total) by mouth 2 (two) times daily. 60 tablet 11   atorvastatin (LIPITOR) 40 MG tablet Take 1 tablet (40 mg total) by mouth daily. 90 tablet 3   carvedilol (COREG) 12.5 MG tablet TAKE 1 TABLET(12.5 MG) BY MOUTH TWICE DAILY 60 tablet 2   dapagliflozin propanediol (FARXIGA) 5 MG TABS tablet Take 1 tablet (5 mg total) by mouth daily before breakfast. 30 tablet 5   furosemide (LASIX) 40 MG tablet Take 40 mg by mouth daily.     Meclizine HCl 25 MG CHEW Chew 25 mg by mouth 3 (three) times daily as needed (vertigo).     metFORMIN (GLUCOPHAGE) 500 MG tablet Take 500 mg by mouth 2 (two) times daily with a meal.       sacubitril-valsartan (ENTRESTO) 97-103 MG Take 1 tablet by mouth 2 (two) times daily. 60 tablet 11   spironolactone (ALDACTONE) 25 MG tablet TAKE 1 TABLET(25 MG) BY MOUTH DAILY 90 tablet 3   No current facility-administered medications for this visit.   Facility-Administered Medications Ordered in Other Visits  Medication Dose Route Frequency Provider Last Rate Last Admin   sodium chloride flush (NS)  0.9 % injection 3 mL  3 mL Intravenous Q12H Sabharwal, Aditya, DO         Objective: Vital signs in last 24 hours: There were no vitals taken for this visit.  Intake/Output from previous day: No intake/output data recorded. Intake/Output this shift: @IOTHISSHIFT @   Physical Exam  Lab Results:  No results found for this or any previous visit (from the past 24 hour(s)).  BMET No results for input(s): "NA", "K", "CL", "CO2", "GLUCOSE", "BUN", "CREATININE", "CALCIUM" in the last 72 hours. PT/INR No results for input(s): "LABPROT", "INR" in the last 72 hours. ABG No results for input(s): "PHART", "HCO3" in the last 72 hours.  Invalid input(s): "PCO2", "PO2"  Studies/Results: No results found.   Assessment/Plan: No problem-specific Assessment & Plan notes found for this encounter.   No orders of the defined types were placed in this encounter.    No orders of the defined types were placed in this encounter.    No follow-ups on file.    CC: ***     Irine Seal 01/03/2023

## 2023-01-11 ENCOUNTER — Telehealth (HOSPITAL_COMMUNITY): Payer: Self-pay | Admitting: Licensed Clinical Social Worker

## 2023-01-11 NOTE — Telephone Encounter (Signed)
Patient unable to make appointment to complete LVAD assessment on 12-31-22. CSW attempted to contact patient today to get appointment rescheduled although unable to leave a message as voicemail not set up. CSW will attempt again next week. Raquel Sarna, [place], [place]

## 2023-01-14 ENCOUNTER — Encounter (HOSPITAL_COMMUNITY): Payer: Self-pay | Admitting: *Deleted

## 2023-01-14 ENCOUNTER — Telehealth (HOSPITAL_COMMUNITY): Payer: Self-pay | Admitting: *Deleted

## 2023-01-14 NOTE — Telephone Encounter (Signed)
Attempted to contact pt to scheduled appt in VAD clinic to see Dr Daniel Nones, and to meet with Kennyth Lose CSW to complete social eval as pt no showed social eval appt 3/18. Pt did not answer, and voicemail is not set up. Will send letter requesting he call to reschedule appts.   Emerson Monte RN [place] Coordinator  Office: [phone]  24/7 Pager: [phone]

## 2023-01-16 ENCOUNTER — Institutional Professional Consult (permissible substitution): Payer: BC Managed Care – PPO | Admitting: Pulmonary Disease

## 2023-01-24 ENCOUNTER — Ambulatory Visit: Payer: BC Managed Care – PPO | Admitting: Urology

## 2023-01-28 DIAGNOSIS — L851 Acquired keratosis [keratoderma] palmaris et plantaris: Secondary | ICD-10-CM | POA: Diagnosis not present

## 2023-01-28 DIAGNOSIS — B351 Tinea unguium: Secondary | ICD-10-CM | POA: Diagnosis not present

## 2023-01-28 DIAGNOSIS — E1151 Type 2 diabetes mellitus with diabetic peripheral angiopathy without gangrene: Secondary | ICD-10-CM | POA: Diagnosis not present

## 2023-01-28 DIAGNOSIS — M79674 Pain in right toe(s): Secondary | ICD-10-CM | POA: Diagnosis not present

## 2023-02-14 ENCOUNTER — Ambulatory Visit: Payer: BC Managed Care – PPO | Admitting: Internal Medicine

## 2023-02-18 ENCOUNTER — Ambulatory Visit (INDEPENDENT_AMBULATORY_CARE_PROVIDER_SITE_OTHER): Payer: BC Managed Care – PPO

## 2023-02-18 DIAGNOSIS — I428 Other cardiomyopathies: Secondary | ICD-10-CM

## 2023-02-18 LAB — CUP PACEART REMOTE DEVICE CHECK
Battery Voltage: 3.08 V
Brady Statistic RV Percent Paced: 93 %
Date Time Interrogation Session: 20240506110846
HighPow Impedance: 70 Ohm
Implantable Lead Connection Status: 753985
Implantable Lead Implant Date: 20221107
Implantable Lead Location: 753860
Implantable Lead Model: 436909
Implantable Lead Serial Number: 81476621
Implantable Pulse Generator Implant Date: 20221107
Lead Channel Impedance Value: 482 Ohm
Lead Channel Pacing Threshold Amplitude: 0.8 V
Lead Channel Pacing Threshold Pulse Width: 0.4 ms
Lead Channel Sensing Intrinsic Amplitude: 1.4 mV
Lead Channel Sensing Intrinsic Amplitude: 9.8 mV
Lead Channel Setting Pacing Amplitude: 2.5 V
Lead Channel Setting Pacing Pulse Width: 0.4 ms
Lead Channel Setting Sensing Sensitivity: 0.8 mV
Pulse Gen Model: 429525
Pulse Gen Serial Number: 84864435

## 2023-03-18 NOTE — Progress Notes (Signed)
Remote ICD transmission.   

## 2023-03-19 ENCOUNTER — Telehealth (HOSPITAL_COMMUNITY): Payer: Self-pay | Admitting: Vascular Surgery

## 2023-03-19 NOTE — Telephone Encounter (Signed)
Called pt to make f/u appt w/ AS. No VM ava will try back later

## 2023-03-21 ENCOUNTER — Ambulatory Visit (INDEPENDENT_AMBULATORY_CARE_PROVIDER_SITE_OTHER): Payer: BC Managed Care – PPO | Admitting: Internal Medicine

## 2023-03-21 ENCOUNTER — Encounter: Payer: Self-pay | Admitting: Internal Medicine

## 2023-03-21 VITALS — BP 111/84 | HR 78 | Ht 78.0 in | Wt 256.4 lb

## 2023-03-21 DIAGNOSIS — G4733 Obstructive sleep apnea (adult) (pediatric): Secondary | ICD-10-CM | POA: Diagnosis not present

## 2023-03-21 DIAGNOSIS — Z2821 Immunization not carried out because of patient refusal: Secondary | ICD-10-CM | POA: Diagnosis not present

## 2023-03-21 DIAGNOSIS — E785 Hyperlipidemia, unspecified: Secondary | ICD-10-CM | POA: Diagnosis not present

## 2023-03-21 DIAGNOSIS — E119 Type 2 diabetes mellitus without complications: Secondary | ICD-10-CM | POA: Diagnosis not present

## 2023-03-21 DIAGNOSIS — I5020 Unspecified systolic (congestive) heart failure: Secondary | ICD-10-CM

## 2023-03-21 DIAGNOSIS — Z720 Tobacco use: Secondary | ICD-10-CM

## 2023-03-21 NOTE — Progress Notes (Signed)
Established Patient Office Visit  Subjective   Patient ID: James [name], male    DOB: [birth year]  Age: 62 y.o. MRN: [id number]  Chief Complaint  Patient presents with   Diabetes    Follow up   [name] returns to care today for routine follow-up.  He was last evaluated by me on 2/2.  James [name] was resumed at 5 mg daily.  No additional medication changes were made.  In the interim he has been closely followed by cardiology as he undergoes evaluation for LVAD.  [name] reports feeling well today.  He is asymptomatic and has no acute concerns to discuss.  Past Medical History:  Diagnosis Date   CKD (chronic kidney disease) stage 2, GFR 60-89 ml/min    Diabetes mellitus type II, non insulin dependent (HCC)    Dilated aortic root (HCC)    Hypercholesteremia    Hypertension    NICM (nonischemic cardiomyopathy) (HCC)    NSVT (nonsustained ventricular tachycardia) (HCC)    Obstructive sleep apnea    was positive but never given a CPAP   Pulmonary hypertension (HCC)    PVC's (premature ventricular contractions)    Tobacco abuse    Past Surgical History:  Procedure Laterality Date   CARDIAC CATHETERIZATION  2002   COLONOSCOPY N/A 03/22/2014   Procedure: COLONOSCOPY;  Surgeon: West Bali, MD;  [facility];  Service: Endoscopy;  Laterality: N/A;  11:15 AM   ICD IMPLANT N/A 08/21/2021   Procedure: ICD IMPLANT;  Surgeon: Marinus Maw, MD;  [facility]: [facility] LAB;  Service: Cardiovascular;  Laterality: N/A;   INGUINAL HERNIA REPAIR Right 06/24/2017   Procedure: HERNIA REPAIR INGUINAL ADULT WITH MESH;  Surgeon: Franky Macho, MD;  Location: [facility];  Service: General;  Laterality: Right;   RIGHT HEART CATH N/A 11/13/2022   Procedure: RIGHT HEART CATH;  Surgeon: Dorthula Nettles, DO;  Location: [facility] INVAS[facility] CV LAB;  Service: Cardiovascular;  Laterality: N/A;   RIGHT HEART CATH N/A 12/21/2022   Procedure: RIGHT HEART CATH;  Surgeon: Dorthula Nettles, DO;  Location: [facility]  INVASIVE CV LAB;  Service: Cardiovascular;  Laterality: N/A;   RIGHT/LEFT HEART CATH AND CORONARY ANGIOGRAPHY N/A 07/19/2021   Procedure: RIGHT/LEFT HEART CATH AND CORONARY ANGIOGRAPHY;  Surgeon: Kathleene Hazel, MD;  Location: [facility] INVASIVE CV LAB;  Service: Cardiovascular;  Laterality: N/A;   TEE WITHOUT CARDIOVERSION N/A 12/21/2022   Procedure: TRANSESOPHAGEAL ECHOCARDIOGRAM (TEE);  Surgeon: Dorthula Nettles, DO;  Location: [facility];  Service: Cardiovascular;  Laterality: N/A;   Social History   Tobacco Use   Smoking status: Former    Packs/day: 0.50    Years: 32.00    Additional pack years: 0.00    Total pack years: 16.00    Types: Cigarettes    Start date: 10/15/1982    Quit date: 07/18/2021    Years since quitting: 1.6   Smokeless tobacco: Never   Tobacco comments:    3 cigaretts a day  Vaping Use   Vaping Use: Never used  Substance Use Topics   Alcohol use: No    Alcohol/week: 0.0 standard drinks of alcohol   Drug use: No   Family History  Problem Relation Age of Onset   Coronary artery disease Mother    Diabetes Father    Heart attack Father    Hypertension Father    Iron deficiency Father    Thyroid disease Sister    Cardiomyopathy Brother        No  significant coronary disease by coronary angiography   Heart disease Maternal Grandfather        Also paternal grandfather   Sudden death Other    Colon cancer Neg Hx    No Known Allergies  Review of Systems  Constitutional:  Negative for chills and fever.  HENT:  Negative for sore throat.   Respiratory:  Negative for cough and shortness of breath.   Cardiovascular:  Negative for chest pain, palpitations and leg swelling.  Gastrointestinal:  Negative for abdominal pain, blood in stool, constipation, diarrhea, nausea and vomiting.  Genitourinary:  Negative for dysuria and hematuria.  Musculoskeletal:  Negative for myalgias.  Skin:  Negative for itching and rash.  Neurological:  Negative for dizziness and  headaches.  Psychiatric/Behavioral:  Negative for depression and suicidal ideas.      Objective:     BP 111/84   Pulse 78   Ht 6\' 6"  (1.981 m)   Wt 256 lb 6.4 oz (116.3 kg)   SpO2 98%   BMI 29.63 kg/m  BP Readings from Last 3 Encounters:  03/22/23 124/80  03/21/23 111/84  12/21/22 (!) 122/93   Physical Exam Vitals reviewed.  Constitutional:      General: He is not in acute distress.    Appearance: Normal appearance. He is not ill-appearing.  HENT:     Head: Normocephalic and atraumatic.     Right Ear: External ear normal.     Left Ear: External ear normal.     Nose: Nose normal. No congestion or rhinorrhea.     Mouth/Throat:     Mouth: Mucous membranes are moist.     Pharynx: Oropharynx is clear.  Eyes:     General: No scleral icterus.    Extraocular Movements: Extraocular movements intact.     Conjunctiva/sclera: Conjunctivae normal.     Pupils: Pupils are equal, round, and reactive to light.  Cardiovascular:     Rate and Rhythm: Normal rate and regular rhythm.     Pulses: Normal pulses.     Heart sounds: Normal heart sounds. No murmur heard. Pulmonary:     Effort: Pulmonary effort is normal.     Breath sounds: Normal breath sounds. No wheezing, rhonchi or rales.  Abdominal:     General: Abdomen is flat. Bowel sounds are normal. There is no distension.     Palpations: Abdomen is soft.     Tenderness: There is no abdominal tenderness.  Musculoskeletal:        General: No swelling or deformity. Normal range of motion.     Cervical back: Normal range of motion.  Skin:    General: Skin is warm and dry.     Capillary Refill: Capillary refill takes less than 2 seconds.  Neurological:     General: No focal deficit present.     Mental Status: He is alert and oriented to person, place, and time.     Motor: No weakness.  Psychiatric:        Mood and Affect: Mood normal.        Behavior: Behavior normal.        Thought Content: Thought content normal.   Last  CBC Lab Results  Component Value Date   WBC 6.8 11/23/2022   HGB 16.3 12/21/2022   HGB 16.0 12/21/2022   HCT 48.0 12/21/2022   HCT 47.0 12/21/2022   MCV 93.2 11/23/2022   MCH 32.2 11/23/2022   RDW 13.2 11/23/2022   PLT 144 (L) 11/23/2022   Last metabolic  panel Lab Results  Component Value Date   GLUCOSE 118 (H) 03/22/2023   NA 139 03/22/2023   K 4.1 03/22/2023   CL 105 03/22/2023   CO2 26 03/22/2023   BUN 22 03/22/2023   CREATININE 1.52 (H) 03/22/2023   GFRNONAA 51 (L) 03/22/2023   CALCIUM 8.6 (L) 03/22/2023   PROT 6.4 (L) 03/22/2023   ALBUMIN 3.7 03/22/2023   LABGLOB 2.6 07/30/2022   AGRATIO 1.6 07/30/2022   BILITOT 1.1 03/22/2023   ALKPHOS 73 03/22/2023   AST 22 03/22/2023   ALT 22 03/22/2023   ANIONGAP 8 03/22/2023   Last lipids Lab Results  Component Value Date   CHOL 141 03/21/2023   HDL 43 03/21/2023   LDLCALC 74 03/21/2023   TRIG 136 03/21/2023   CHOLHDL 3.3 03/21/2023   Last hemoglobin A1c Lab Results  Component Value Date   HGBA1C 6.6 (H) 11/23/2022   Last thyroid functions Lab Results  Component Value Date   TSH 1.852 11/23/2022   The 10-year ASCVD risk score (Arnett DK, et al., 2019) is: 23.2%    Assessment & Plan:   Problem List Items Addressed This Visit       HFrEF (heart failure with reduced ejection fraction) (HCC) (Chronic)    Closely followed by the advanced heart failure clinic.  He is asymptomatic currently and euvolemic on exam today.  Most recently he has undergone evaluation for LVAD but has been lost to follow-up. -I recommended that [name] contact the advanced heart failure clinic to schedule follow-up for the near future.      Obstructive sleep apnea    PSG from 1/15 shows moderate-severe OSA.  He has an appointment with sleep medicine scheduled for 7/8.      Diabetes mellitus type 2 in nonobese (HCC)    A1c 6.6 in February.  He is currently prescribed metformin 500 mg twice daily and Farxiga 5 mg daily. -No  medication changes today. -Diabetes related preventative care items are up-to-date      Hyperlipidemia - Primary    Atorvastatin 40 mg daily was started in February as a result of his updated lipid panel.  He has not experienced any adverse side effects since starting atorvastatin. -Repeat lipid panel ordered today      Tobacco abuse    Continues to smoke 2 cigarettes/day.  Complete cessation was strongly encouraged.      Return in about 3 months (around 06/21/2023).   Billie Lade, MD

## 2023-03-21 NOTE — Patient Instructions (Signed)
It was a pleasure to see you today.  Thank you for giving [name] the opportunity to be involved in your care.  Below is a brief recap of your visit and next steps.  We will plan to see you again in 3 months.  Summary No medication changes today Repeat cholesterol panel ordered Follow up in 3 months I recommend calling the heart failure team to schedule follow up, and getting off of cigarettes completely.

## 2023-03-22 ENCOUNTER — Ambulatory Visit (HOSPITAL_COMMUNITY)
Admission: RE | Admit: 2023-03-22 | Discharge: 2023-03-22 | Disposition: A | Discharge status: Home / Self Care | Payer: BC Managed Care – PPO | Source: Ambulatory Visit | Attending: Cardiology | Admitting: Cardiology

## 2023-03-22 ENCOUNTER — Encounter (HOSPITAL_COMMUNITY): Payer: Self-pay | Admitting: Cardiology

## 2023-03-22 DIAGNOSIS — Z95810 Presence of automatic (implantable) cardiac defibrillator: Secondary | ICD-10-CM | POA: Insufficient documentation

## 2023-03-22 DIAGNOSIS — Z8249 Family history of ischemic heart disease and other diseases of the circulatory system: Secondary | ICD-10-CM | POA: Insufficient documentation

## 2023-03-22 DIAGNOSIS — Z794 Long term (current) use of insulin: Secondary | ICD-10-CM | POA: Diagnosis not present

## 2023-03-22 DIAGNOSIS — Z87891 Personal history of nicotine dependence: Secondary | ICD-10-CM | POA: Diagnosis not present

## 2023-03-22 DIAGNOSIS — E1122 Type 2 diabetes mellitus with diabetic chronic kidney disease: Secondary | ICD-10-CM | POA: Insufficient documentation

## 2023-03-22 DIAGNOSIS — I4892 Unspecified atrial flutter: Secondary | ICD-10-CM | POA: Diagnosis not present

## 2023-03-22 DIAGNOSIS — I480 Paroxysmal atrial fibrillation: Secondary | ICD-10-CM | POA: Insufficient documentation

## 2023-03-22 DIAGNOSIS — I509 Heart failure, unspecified: Secondary | ICD-10-CM | POA: Insufficient documentation

## 2023-03-22 DIAGNOSIS — I130 Hypertensive heart and chronic kidney disease with heart failure and stage 1 through stage 4 chronic kidney disease, or unspecified chronic kidney disease: Secondary | ICD-10-CM | POA: Diagnosis not present

## 2023-03-22 DIAGNOSIS — I428 Other cardiomyopathies: Secondary | ICD-10-CM | POA: Diagnosis not present

## 2023-03-22 DIAGNOSIS — Z79899 Other long term (current) drug therapy: Secondary | ICD-10-CM | POA: Diagnosis not present

## 2023-03-22 DIAGNOSIS — I5020 Unspecified systolic (congestive) heart failure: Secondary | ICD-10-CM

## 2023-03-22 DIAGNOSIS — N189 Chronic kidney disease, unspecified: Secondary | ICD-10-CM | POA: Diagnosis not present

## 2023-03-22 DIAGNOSIS — E119 Type 2 diabetes mellitus without complications: Secondary | ICD-10-CM

## 2023-03-22 LAB — COMPREHENSIVE METABOLIC PANEL
ALT: 22 U/L (ref 0–44)
AST: 22 U/L (ref 15–41)
Albumin: 3.7 g/dL (ref 3.5–5.0)
Alkaline Phosphatase: 73 U/L (ref 38–126)
Anion gap: 8 (ref 5–15)
BUN: 22 mg/dL (ref 8–23)
CO2: 26 mmol/L (ref 22–32)
Calcium: 8.6 mg/dL — ABNORMAL LOW (ref 8.9–10.3)
Chloride: 105 mmol/L (ref 98–111)
Creatinine, Ser: 1.52 mg/dL — ABNORMAL HIGH (ref 0.61–1.24)
GFR, Estimated: 51 mL/min — ABNORMAL LOW (ref >60)
Glucose, Bld: 118 mg/dL — ABNORMAL HIGH (ref 70–99)
Potassium: 4.1 mmol/L (ref 3.5–5.1)
Sodium: 139 mmol/L (ref 135–145)
Total Bilirubin: 1.1 mg/dL (ref 0.3–1.2)
Total Protein: 6.4 g/dL — ABNORMAL LOW (ref 6.5–8.1)

## 2023-03-22 LAB — BRAIN NATRIURETIC PEPTIDE: B Natriuretic Peptide: 886.7 pg/mL — ABNORMAL HIGH (ref 0.0–100.0)

## 2023-03-22 LAB — LIPID PANEL
Chol/HDL Ratio: 3.3 ratio (ref 0.0–5.0)
Cholesterol, Total: 141 mg/dL (ref 100–199)
HDL: 43 mg/dL (ref >39)
LDL Chol Calc (NIH): 74 mg/dL (ref 0–99)
Triglycerides: 136 mg/dL (ref 0–149)
VLDL Cholesterol Cal: 24 mg/dL (ref 5–40)

## 2023-03-22 MED ORDER — DAPAGLIFLOZIN PROPANEDIOL 10 MG PO TABS: 10.0000 mg | ORAL_TABLET | Freq: Every day | ORAL | 3 refills | Status: DC

## 2023-03-22 NOTE — Progress Notes (Signed)
ADVANCED HEART FAILURE CLINIC NOTE  Referring Physician: Billie Lade, MD  Primary Care: Billie Lade, MD Primary Cardiologist:  HPI: James [name] is a 62 y.o. male with nonischemic cardiomyopathy s/p ICD, HTNm, T2DM, aortic root aneurysm presenting today for follow-up James [name] HF dates back to 2011 when he was diagnosed with nonischemic cardiomyopathy.  According to James [name] he came to the [facility] system in 2013 when he had a left heart cath with no coronary disease with a EF of 20%, he was started on low-dose GDMT at that time.  By 2013 his EF improved to 45%.  He followed yearly with [name] admission until 2022 when he once again had a reduction in LVEF to 20 to 25% patient was started on Entresto With a repeat heart cath demonstrating no significant coronary disease.  For the past 1 year despite the addition of GDMT, patient has had progressively worsening exercise capacity with no improvement in LV function.  Referred to heart failure for further evaluation.  Since establishing care with James [name], James [name] has undergone evaluation for advanced therapies.  His CPX is suggestive of severe heart failure limitation and multiple right heart catheterizations demonstrate persistently reduced cardiac index less than 2 L/min/m.  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  Interval hx:  Lost to follow-up for several months; from a functional standpoint doing very well though.  NYHA II symptoms.  Reports compliance with all of his medications.  Activity level/exercise tolerance: NYHA IIb Orthopnea:  Sleeps on 2 pillows Paroxysmal noctural dyspnea: No Chest pain/pressure: No Orthostatic lightheadedness: No Palpitations: No Lower extremity edema: No Presyncope/syncope: No Cough: No  Past Medical History:  Diagnosis Date   CKD (chronic kidney disease) stage 2, GFR 60-89 ml/min     Diabetes mellitus type II, non insulin dependent (HCC)    Dilated aortic root (HCC)    Hypercholesteremia    Hypertension    NICM (nonischemic cardiomyopathy) (HCC)    NSVT (nonsustained ventricular tachycardia) (HCC)    Obstructive sleep apnea    was positive but never given a CPAP   Pulmonary hypertension (HCC)    PVC's (premature ventricular contractions)    Tobacco abuse     Current Outpatient Medications  Medication Sig Dispense Refill   acetaminophen (TYLENOL) 500 MG tablet Take 1,000 mg by mouth every 6 (six) hours as needed for moderate pain.     albuterol (VENTOLIN HFA) 108 (90 Base) MCG/ACT inhaler Inhale 1-2 puffs into the lungs every 6 (six) hours as needed for wheezing or shortness of breath. 18 g 0   apixaban (ELIQUIS) 5 MG TABS tablet Take 1 tablet (5 mg total) by mouth 2 (two) times daily. 60 tablet 11   atorvastatin (LIPITOR) 40 MG tablet Take 1 tablet (40 mg total) by mouth daily. 90 tablet 3   carvedilol (COREG) 12.5 MG tablet TAKE 1 TABLET(12.5 MG) BY MOUTH TWICE DAILY 60 tablet 2   dapagliflozin propanediol (FARXIGA) 5 MG TABS tablet Take 1 tablet (5 mg total) by mouth daily before breakfast. 30 tablet 5   furosemide (LASIX) 40 MG tablet Take 40 mg by mouth daily.     Meclizine HCl 25 MG CHEW Chew 25 mg by mouth 3 (three) times daily as needed (vertigo).     metFORMIN (GLUCOPHAGE) 500 MG tablet Take 500 mg by mouth 2 (two) times daily with a meal.  sacubitril-valsartan (ENTRESTO) 97-103 MG Take 1 tablet by mouth 2 (two) times daily. 60 tablet 11   spironolactone (ALDACTONE) 25 MG tablet TAKE 1 TABLET(25 MG) BY MOUTH DAILY 90 tablet 3   No current facility-administered medications for this encounter.   Facility-Administered Medications Ordered in Other Encounters  Medication Dose Route Frequency Provider Last Rate Last Admin   sodium chloride flush (NS) 0.9 % injection 3 mL  3 mL Intravenous Q12H Evania Lyne, DO        No Known Allergies    Social  History   Socioeconomic History   Marital status: Married    Spouse name: James [name]   Number of children: 4   Years of education: Not on file   Highest education level: Associate degree: occupational, Scientist, product/process development, or vocational program  Occupational History   Occupation: Merchandiser, retail    Comment: [employer]  Tobacco Use   Smoking status: Former    Packs/day: 0.50    Years: 32.00    Additional pack years: 0.00    Total pack years: 16.00    Types: Cigarettes    Start date: 10/15/1982    Quit date: 07/18/2021    Years since quitting: 1.6   Smokeless tobacco: Never   Tobacco comments:    3 cigaretts a day  Vaping Use   Vaping Use: Never used  Substance and Sexual Activity   Alcohol use: No    Alcohol/week: 0.0 standard drinks of alcohol   Drug use: No   Sexual activity: Yes    Birth control/protection: None  Other Topics Concern   Not on file  Social History Narrative   Divorced   No regular exercise   Social Determinants of Health   Financial Resource Strain: Low Risk  (07/21/2021)   Overall Financial Resource Strain (CARDIA)    Difficulty of Paying Living Expenses: Not very hard  Food Insecurity: No Food Insecurity (07/22/2022)   Hunger Vital Sign    Worried About Running Out of Food in the Last Year: Never true    Ran Out of Food in the Last Year: Never true  Transportation Needs: No Transportation Needs (07/22/2022)   PRAPARE - Administrator, Civil Service (Medical): No    Lack of Transportation (Non-Medical): No  Physical Activity: Not on file  Stress: Not on file  Social Connections: Not on file  Intimate Partner Violence: Not At Risk (07/22/2022)   Humiliation, Afraid, Rape, and Kick questionnaire    Fear of Current or Ex-Partner: No    Emotionally Abused: No    Physically Abused: No    Sexually Abused: No      Family History  Problem Relation Age of Onset   Coronary artery disease Mother    Diabetes Father    Heart attack Father     Hypertension Father    Iron deficiency Father    Thyroid disease Sister    Cardiomyopathy Brother        No significant coronary disease by coronary angiography   Heart disease Maternal Grandfather        Also paternal grandfather   Sudden death Other    Colon cancer Neg Hx     PHYSICAL EXAM: Vitals:   03/22/23 0950  BP: 124/80  Pulse: 68  SpO2: 98%   GENERAL: Well nourished, well developed, and in no apparent distress at rest.  HEENT: Negative for arcus senilis or xanthelasma. There is no scleral icterus.  The mucous membranes are pink and moist.  NECK: Supple, No masses. Normal carotid upstrokes without bruits. No masses or thyromegaly.    CHEST: There are no chest wall deformities. There is no chest wall tenderness. Respirations are unlabored.  Lungs-CTA bilaterally CARDIAC:  JVP: 7 cm          Normal rate with regular rhythm. No murmurs, rubs or gallops.  Pulses are 2+ and symmetrical in upper and lower extremities.  No edema.  ABDOMEN: Soft, non-tender, non-distended. There are no masses or hepatomegaly. There are normal bowel sounds.  EXTREMITIES: Warm and well perfused with no cyanosis, clubbing.  LYMPHATIC: No axillary or supraclavicular lymphadenopathy.  NEUROLOGIC: Patient is oriented x3 with no focal or lateralizing neurologic deficits.  PSYCH: Patients affect is appropriate, there is no evidence of anxiety or depression.  SKIN: Warm and dry; no lesions or wounds.    DATA REVIEW  ECG: Normal sinus rhythm  ECHO: 07/23/2022: LVEF 20 to 25%, severely dilated, RV function moderately reduced, LA severely dilated, mild MR. 10/22/22: LVEF 25-30% 12/21/22 (TEE): LVEF 20-25%, RV function mildly reduced. Aneurysmal aortic root at [facility].   CATH: RHC/LHC (07/19/21): No angiographic evidence of CAD Elevated right and left heart pressures (LV: 130/22/39, RA 15, RV 82/13/20, PA 83/30 mean 49, PCWP 35).   RHC: 12/21/22: HEMODYNAMICS: RA:                  8 mmHg (mean) RV:                   83/7 mmHg PA:                  79/34 mmHg (51 mean) PCWP:            25 mmHg (mean)                                      Estimated Fick CO/CI   3.3 L/min, 1.3 L/min/m2         Thermodilution CO/CI   4.9 L/min, 1.9 L/min/m2 NIBP: 125/88                                      TPG                 26  mmHg                                            PVR                 5.3-8.8 Wood Units  PAPi                5.6       ASSESSMENT & PLAN:  NYHA IIb, stage C nonischemic cardiomyopathy Etiology of HF: Nonischemic cardiomyopathy likely idiopathic dilated however cannot rule out infiltrative disease.  Plan for cardiac MRI tomorrow NYHA class / AHA Stage: NYHA IIb Volume status & Diuretics: Euvolemic on exam; currently taking lasix 20mg  every day.  Vasodilators: Continue entresto to 97/103 Beta-Blocker: Coreg 12.5 twice daily MRA: Spironolactone 25 Cardiometabolic: Farxiga 10 mg daily Devices therapies & Valvulopathies: Primary prevention ICD in place Advanced therapies: Right heart catheterization and CPX consistent with stage D heart failure with severe heart  failure limitations.  However, from a subjective standpoint he still feels fairly well with no PND, lower extremity edema and reports that he does not feel limited due to shortness of breath.  At this time we will follow him closely clinically if he does have any decompensation plan for advanced therapies.  He has completed most of his LVAD evaluation and has been discussed at [facility].  2. CKD - repeat labs today  3. Aortic root aneurysm - 5cm by echo in 10/23. Stable.   4. Paroxysmal atrial fibrillation/flutter - apixaban 5mg  BID  Mirha Brucato Advanced Heart Failure Mechanical Circulatory Support

## 2023-03-22 NOTE — Patient Instructions (Signed)
INCREASE Farxiga to 10 mg daily.  Labs done today, your results will be available in MyChart, we will contact you for abnormal readings.  Your physician recommends that you schedule a follow-up appointment as scheduled.  If you have any questions or concerns before your next appointment please send [name] a message through [place] or call our office at [phone].    TO LEAVE A MESSAGE FOR THE NURSE SELECT OPTION 2, PLEASE LEAVE A MESSAGE INCLUDING: YOUR NAME DATE OF BIRTH CALL BACK NUMBER REASON FOR CALL**this is important as we prioritize the call backs  YOU WILL RECEIVE A CALL BACK THE SAME DAY AS LONG AS YOU CALL BEFORE 4:00 PM  At the Advanced Heart Failure Clinic, you and your health needs are our priority. As part of our continuing mission to provide you with exceptional heart care, we have created designated Provider Care Teams. These Care Teams include your primary Cardiologist (physician) and Advanced Practice Providers (APPs- Physician Assistants and Nurse Practitioners) who all work together to provide you with the care you need, when you need it.   You may see any of the following providers on your designated Care Team at your next follow up: Dr Arvilla Meres Dr Marca Ancona Dr. Marcos Eke, NP Robbie Lis, [place] [facility] [place], [place] Brynda Peon, NP Karle Plumber, PharmD   Please be sure to bring in all your medications bottles to every appointment.    Thank you for choosing [facility]

## 2023-03-27 NOTE — Assessment & Plan Note (Signed)
Closely followed by the advanced heart failure clinic.  He is asymptomatic currently and euvolemic on exam today.  Most recently he has undergone evaluation for LVAD but has been lost to follow-up. -I recommended that James [name] contact the advanced heart failure clinic to schedule follow-up for the near future.

## 2023-03-27 NOTE — Assessment & Plan Note (Signed)
Atorvastatin 40 mg daily was started in February as a result of his updated lipid panel.  He has not experienced any adverse side effects since starting atorvastatin. -Repeat lipid panel ordered today

## 2023-03-27 NOTE — Assessment & Plan Note (Signed)
PSG from 1/15 shows moderate-severe OSA.  He has an appointment with sleep medicine scheduled for 7/8.

## 2023-03-27 NOTE — Assessment & Plan Note (Signed)
Continues to smoke 2 cigarettes/day.  Complete cessation was strongly encouraged.

## 2023-03-27 NOTE — Assessment & Plan Note (Signed)
A1c 6.6 in February.  He is currently prescribed metformin 500 mg twice daily and Farxiga 5 mg daily. -No medication changes today. -Diabetes related preventative care items are up-to-date

## 2023-04-01 ENCOUNTER — Other Ambulatory Visit: Payer: Self-pay | Admitting: Internal Medicine

## 2023-04-03 ENCOUNTER — Other Ambulatory Visit: Payer: Self-pay | Admitting: *Deleted

## 2023-04-03 MED ORDER — SPIRONOLACTONE 25 MG PO TABS: ORAL_TABLET | ORAL | 3 refills | Status: DC

## 2023-04-08 DIAGNOSIS — E1151 Type 2 diabetes mellitus with diabetic peripheral angiopathy without gangrene: Secondary | ICD-10-CM | POA: Diagnosis not present

## 2023-04-08 DIAGNOSIS — M79674 Pain in right toe(s): Secondary | ICD-10-CM | POA: Diagnosis not present

## 2023-04-08 DIAGNOSIS — B351 Tinea unguium: Secondary | ICD-10-CM | POA: Diagnosis not present

## 2023-04-08 DIAGNOSIS — L851 Acquired keratosis [keratoderma] palmaris et plantaris: Secondary | ICD-10-CM | POA: Diagnosis not present

## 2023-04-22 ENCOUNTER — Encounter: Payer: Self-pay | Admitting: Pulmonary Disease

## 2023-04-22 ENCOUNTER — Ambulatory Visit (INDEPENDENT_AMBULATORY_CARE_PROVIDER_SITE_OTHER): Payer: BC Managed Care – PPO | Admitting: Pulmonary Disease

## 2023-04-22 VITALS — BP 127/72 | HR 74 | Ht 78.0 in | Wt 247.2 lb

## 2023-04-22 DIAGNOSIS — G4733 Obstructive sleep apnea (adult) (pediatric): Secondary | ICD-10-CM | POA: Diagnosis not present

## 2023-04-22 DIAGNOSIS — I5022 Chronic systolic (congestive) heart failure: Secondary | ICD-10-CM | POA: Diagnosis not present

## 2023-04-22 NOTE — Patient Instructions (Signed)
Will arrange for a CPAP titration study and schedule follow up after this is reviewed

## 2023-04-22 NOTE — Progress Notes (Signed)
[facility]  Chief Complaint  Patient presents with   Consult    Past Surgical History:  He  has a past surgical history that includes Cardiac catheterization (2002); Colonoscopy (N/A, 03/22/2014); Inguinal hernia repair (Right, 06/24/2017); RIGHT/LEFT HEART CATH AND CORONARY ANGIOGRAPHY (N/A, 07/19/2021); ICD IMPLANT (N/A, 08/21/2021); RIGHT HEART CATH (N/A, 11/13/2022); RIGHT HEART CATH (N/A, 12/21/2022); and TEE without cardioversion (N/A, 12/21/2022).  Past Medical History:  CKD 2, DM type 2, HLD, HTN, Non-ischemic CM, NSVT, Pulmonary hypertension, Paroxysmal atrial fibrillation  Constitutional:  BP 127/72   Pulse 74   Ht 6\' 6"  (1.981 m)   Wt 247 lb 3.2 oz (112.1 kg)   SpO2 97%   BMI 28.57 kg/m   Brief Summary:  James [name] is a 62 y.o. male former smoker with obstructive sleep apnea and COPD.      Subjective:   He had several sleep studies over the years.  He doesn't remember anyone discussing with him the results of the studies or whether he needed treatment.  He has not used CPAP before.  His more recent sleep study showed severe sleep apnea.    He snores at night.  He get tired during the day sometimes.  He goes to sleep at 1 am.  He falls asleep in 30 minutes.  He wakes up 1 or 2 times to use the bathroom.  He gets out of bed at 7 am.  He feels okay in the morning.  He denies morning headache.  He does not use anything to help him fall sleep or stay awake.  He denies sleep walking, sleep talking, bruxism, or nightmares.  There is no history of restless legs.  He denies sleep hallucinations, sleep paralysis, or cataplexy.  The Epworth score is 2 out of 24.  His PFT showed mild changes of COPD.  He quit smoking several years ago.  He has an albuterol inhalers, but doesn't need to use it.  Not having cough, wheeze, or sputum.  Doesn't feel like his breathing limits his activity level.  Physical Exam:   Appearance - well kempt    ENMT - no sinus tenderness, no oral exudate, no LAN, Mallampati 3 airway, no stridor  Respiratory - equal breath sounds bilaterally, no wheezing or rales  CV - s1s2 regular rate and rhythm, no murmurs  Ext - no clubbing, no edema  Skin - no rashes  Psych - normal mood and affect   Pulmonary testing:  PFT 12/13/22 >> FEV1 2.52 (54%), FEV1% 65, TLC 7.06 (81%), DLCO 60%  Chest Imaging:    Sleep Tests:  PSG 03/16/12 >> AHI 34.7, SpO2 low 82% PSG 02/23/16 >> AHI 35, SpO2 low 80% PSG 10/29/22 >> AHI 30.6, SpO2 low 83%   Cardiac Tests:  Echo 10/22/22 >> EF less than 20%, grade 2 DD  Social History:  He  reports that he quit smoking about 21 months ago. His smoking use included cigarettes. He started smoking about 40 years ago. He has a 16.00 pack-year smoking history. He has never used smokeless tobacco. He reports that he does not drink alcohol and does not use drugs.  Family History:  His family history includes Cardiomyopathy in his brother; Coronary artery disease in his mother; Diabetes in his father; Heart attack in his father; Heart disease in his maternal grandfather; Hypertension in his father; Iron deficiency in his father; Sudden death in an other family member; Thyroid disease in his sister.  Assessment/Plan:   Obstructive sleep apnea. - reviewed his sleep study results - discussed how untreated sleep apnea can impact his health - treatment options reviewed - given that he has severe systolic congestive heart failure with reduced ejection fraction, he will need to do an in lab CPAP titration study to help determine optimal CPAP settings  COPD. - mild - prn albuterol  Chronic systolic CHF with non-ischemic CM, Paroxysmal atrial fibrillation. - followed by Dr. Dorthula Nettles with CHF team  Time Spent Involved in Patient Care on Day of Examination:  51 minutes  Follow up:   Patient Instructions  Will arrange for a CPAP titration study and schedule follow  up after this is reviewed  Medication List:   Allergies as of 04/22/2023   No Known Allergies      Medication List        Accurate as of April 22, 2023  3:56 PM. If you have any questions, ask your nurse or doctor.          acetaminophen 500 MG tablet Commonly known as: TYLENOL Take 1,000 mg by mouth every 6 (six) hours as needed for moderate pain.   albuterol 108 (90 Base) MCG/ACT inhaler Commonly known as: VENTOLIN HFA Inhale 1-2 puffs into the lungs every 6 (six) hours as needed for wheezing or shortness of breath.   apixaban 5 MG Tabs tablet Commonly known as: Eliquis Take 1 tablet (5 mg total) by mouth 2 (two) times daily.   atorvastatin 40 MG tablet Commonly known as: LIPITOR Take 1 tablet (40 mg total) by mouth daily.   carvedilol 12.5 MG tablet Commonly known as: COREG TAKE 1 TABLET(12.5 MG) BY MOUTH TWICE DAILY   dapagliflozin propanediol 10 MG Tabs tablet Commonly known as: Farxiga Take 1 tablet (10 mg total) by mouth daily before breakfast.   Entresto 97-103 MG Generic drug: sacubitril-valsartan Take 1 tablet by mouth 2 (two) times daily.   furosemide 40 MG tablet Commonly known as: LASIX TAKE 1 TABLET BY MOUTH EVERY DAY   Meclizine HCl 25 MG Chew Chew 25 mg by mouth 3 (three) times daily as needed (vertigo).   metFORMIN 500 MG tablet Commonly known as: GLUCOPHAGE Take 500 mg by mouth 2 (two) times daily with a meal.   spironolactone 25 MG tablet Commonly known as: ALDACTONE TAKE 1 TABLET(25 MG) BY MOUTH DAILY        Signature:  Coralyn Helling, MD [facility] Pager - [phone] 04/22/2023, 3:56 PM

## 2023-04-25 ENCOUNTER — Encounter (HOSPITAL_COMMUNITY): Payer: BC Managed Care – PPO | Admitting: Internal Medicine

## 2023-04-25 ENCOUNTER — Ambulatory Visit: Payer: BC Managed Care – PPO | Admitting: Urology

## 2023-04-25 NOTE — Progress Notes (Deleted)
Subjective: 1. Elevated PSA      Consult requested by Christel Mormon MD  James [name] is a 62 yo male who is sent for the recent finding of a PSA of 7.00 on 11/23/22.  He is a diabetic with CKD 3 and CHF with cardiomyopathy and a recent EF of 25% in March.  He is being evaluated for a LVAD by cardiology.   ROS:  ROS  No Known Allergies  Past Medical History:  Diagnosis Date   CKD (chronic kidney disease) stage 2, GFR 60-89 ml/min    COPD (chronic obstructive pulmonary disease) (HCC)    Diabetes mellitus type II, non insulin dependent (HCC)    Dilated aortic root (HCC)    Hypercholesteremia    Hypertension    NICM (nonischemic cardiomyopathy) (HCC)    NSVT (nonsustained ventricular tachycardia) (HCC)    Obstructive sleep apnea    was positive but never given a CPAP   Paroxysmal atrial fibrillation (HCC)    Pulmonary hypertension (HCC)    PVC's (premature ventricular contractions)    Tobacco abuse     Past Surgical History:  Procedure Laterality Date   CARDIAC CATHETERIZATION  2002   COLONOSCOPY N/A 03/22/2014   Procedure: COLONOSCOPY;  Surgeon: West Bali, MD;  Location: [facility];  Service: Endoscopy;  Laterality: N/A;  11:15 AM   ICD IMPLANT N/A 08/21/2021   Procedure: ICD IMPLANT;  Surgeon: Marinus Maw, MD;  Location: [facility] INVASIVE CV LAB;  Service: Cardiovascular;  Laterality: N/A;   INGUINAL HERNIA REPAIR Right 06/24/2017   Procedure: HERNIA REPAIR INGUINAL ADULT WITH MESH;  Surgeon: Franky Macho, MD;  Location: [facility] ORS;  Service: General;  Laterality: Right;   RIGHT HEART CATH N/A 11/13/2022   Procedure: RIGHT HEART CATH;  Surgeon: Dorthula Nettles, DO;  Location: [facility] INVASIVE CV LAB;  Service: Cardiovascular;  Laterality: N/A;   RIGHT HEART CATH N/A 12/21/2022   Procedure: RIGHT HEART CATH;  Surgeon: Dorthula Nettles, DO;  Location: [facility] INVASIVE CV LAB;  Service: Cardiovascular;  Laterality: N/A;   RIGHT/LEFT HEART CATH AND CORONARY ANGIOGRAPHY N/A 07/19/2021   Procedure:  RIGHT/LEFT HEART CATH AND CORONARY ANGIOGRAPHY;  Surgeon: Kathleene Hazel, MD;  Location: [facility] INVASIVE CV LAB;  Service: Cardiovascular;  Laterality: N/A;   TEE WITHOUT CARDIOVERSION N/A 12/21/2022   Procedure: TRANSESOPHAGEAL ECHOCARDIOGRAM (TEE);  Surgeon: Dorthula Nettles, DO;  Location: [facility] ENDOSCOPY;  Service: Cardiovascular;  Laterality: N/A;    Social History   Socioeconomic History   Marital status: Married    Spouse name: [name]   Number of children: 4   Years of education: Not on file   Highest education level: Associate degree: occupational, Scientist, product/process development, or vocational program  Occupational History   Occupation: Merchandiser, retail    Comment: [employer]  Tobacco Use   Smoking status: Former    Current packs/day: 0.00    Average packs/day: 0.5 packs/day for 38.8 years (19.4 ttl pk-yrs)    Types: Cigarettes    Start date: 10/15/1982    Quit date: 07/18/2021    Years since quitting: 1.7   Smokeless tobacco: Never   Tobacco comments:    3 cigaretts a day  Vaping Use   Vaping status: Never Used  Substance and Sexual Activity   Alcohol use: No    Alcohol/week: 0.0 standard drinks of alcohol   Drug use: No   Sexual activity: Yes    Birth control/protection: None  Other Topics Concern   Not on file  Social History Narrative  Divorced   No regular exercise   Social Determinants of Health   Financial Resource Strain: Low Risk  (07/21/2021)   Overall Financial Resource Strain (CARDIA)    Difficulty of Paying Living Expenses: Not very hard  Food Insecurity: No Food Insecurity (07/22/2022)   Hunger Vital Sign    Worried About Running Out of Food in the Last Year: Never true    Ran Out of Food in the Last Year: Never true  Transportation Needs: No Transportation Needs (07/22/2022)   PRAPARE - Administrator, Civil Service (Medical): No    Lack of Transportation (Non-Medical): No  Physical Activity: Not on file  Stress: Not on file  Social Connections:  Not on file  Intimate Partner Violence: Not At Risk (07/22/2022)   Humiliation, Afraid, Rape, and Kick questionnaire    Fear of Current or Ex-Partner: No    Emotionally Abused: No    Physically Abused: No    Sexually Abused: No    Family History  Problem Relation Age of Onset   Coronary artery disease Mother    Diabetes Father    Heart attack Father    Hypertension Father    Iron deficiency Father    Thyroid disease Sister    Cardiomyopathy Brother        No significant coronary disease by coronary angiography   Heart disease Maternal Grandfather        Also paternal grandfather   Sudden death Other    Colon cancer Neg Hx     Anti-infectives: Anti-infectives (From admission, onward)    None       Current Outpatient Medications  Medication Sig Dispense Refill   acetaminophen (TYLENOL) 500 MG tablet Take 1,000 mg by mouth every 6 (six) hours as needed for moderate pain.     albuterol (VENTOLIN HFA) 108 (90 Base) MCG/ACT inhaler Inhale 1-2 puffs into the lungs every 6 (six) hours as needed for wheezing or shortness of breath. 18 g 0   apixaban (ELIQUIS) 5 MG TABS tablet Take 1 tablet (5 mg total) by mouth 2 (two) times daily. 60 tablet 11   atorvastatin (LIPITOR) 40 MG tablet Take 1 tablet (40 mg total) by mouth daily. 90 tablet 3   carvedilol (COREG) 12.5 MG tablet TAKE 1 TABLET(12.5 MG) BY MOUTH TWICE DAILY 60 tablet 2   dapagliflozin propanediol (FARXIGA) 10 MG TABS tablet Take 1 tablet (10 mg total) by mouth daily before breakfast. 90 tablet 3   furosemide (LASIX) 40 MG tablet TAKE 1 TABLET BY MOUTH EVERY DAY 90 tablet 3   Meclizine HCl 25 MG CHEW Chew 25 mg by mouth 3 (three) times daily as needed (vertigo).     metFORMIN (GLUCOPHAGE) 500 MG tablet Take 500 mg by mouth 2 (two) times daily with a meal.      sacubitril-valsartan (ENTRESTO) 97-103 MG Take 1 tablet by mouth 2 (two) times daily. 60 tablet 11   spironolactone (ALDACTONE) 25 MG tablet TAKE 1 TABLET(25 MG) BY  MOUTH DAILY 90 tablet 3   No current facility-administered medications for this visit.   Facility-Administered Medications Ordered in Other Visits  Medication Dose Route Frequency Provider Last Rate Last Admin   sodium chloride flush (NS) 0.9 % injection 3 mL  3 mL Intravenous Q12H Sabharwal, Aditya, DO         Objective: Vital signs in last 24 hours: There were no vitals taken for this visit.  Intake/Output from previous day: No intake/output data recorded. Intake/Output this  shift: @IOTHISSHIFT @   Physical Exam  Lab Results:  No results found for this or any previous visit (from the past 24 hour(s)).  BMET No results for input(s): "NA", "K", "CL", "CO2", "GLUCOSE", "BUN", "CREATININE", "CALCIUM" in the last 72 hours. PT/INR No results for input(s): "LABPROT", "INR" in the last 72 hours. ABG No results for input(s): "PHART", "HCO3" in the last 72 hours.  Invalid input(s): "PCO2", "PO2"  Studies/Results: No results found.   Assessment/Plan: No problem-specific Assessment & Plan notes found for this encounter.   No orders of the defined types were placed in this encounter.    No orders of the defined types were placed in this encounter.    No follow-ups on file.    CC: ***     Bjorn Pippin 04/25/2023

## 2023-05-02 NOTE — Progress Notes (Signed)
ADVANCED HEART FAILURE CLINIC NOTE  Referring Physician: Billie Lade, MD  Primary Care: Billie Lade, MD Primary Cardiologist:  HPI: James [name] is a 62 y.o. male with nonischemic cardiomyopathy s/p ICD, HTNm, T2DM, aortic root aneurysm presenting today for follow-up James [name] HF dates back to 2011 when he was diagnosed with nonischemic cardiomyopathy.  According to James [name] he came to the [facility] in 2013 when he had a left heart cath with no coronary disease with a EF of 20%, he was started on low-dose GDMT at that time.  By 2013 his EF improved to 45%.  He followed yearly with [name] admission until 2022 when he once again had a reduction in LVEF to 20 to 25% patient was started on Entresto With a repeat heart cath demonstrating no significant coronary disease.  For the past 1 year despite the addition of GDMT, patient has had progressively worsening exercise capacity with no improvement in LV function.  Referred to heart failure for further evaluation.  Since establishing care with [name], James [name] has undergone evaluation for advanced therapies.  His CPX is suggestive of severe heart failure limitation and multiple right heart catheterizations demonstrate persistently reduced cardiac index less than 2 L/min/m.  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  Interval hx:  Doing very well from a functional standpoint. No SOB, PND, or LE edema. No lightheadedness. Waiting for sleep study currently.   Activity level/exercise tolerance: NYHA IIb Orthopnea:  Sleeps on 2 pillows Paroxysmal noctural dyspnea: No Chest pain/pressure: No Orthostatic lightheadedness: No Palpitations: No Lower extremity edema: No Presyncope/syncope: No Cough: No  Past Medical History:  Diagnosis Date   CKD (chronic kidney disease) stage 2, GFR 60-89 ml/min    COPD (chronic obstructive  pulmonary disease) (HCC)    Diabetes mellitus type II, non insulin dependent (HCC)    Dilated aortic root (HCC)    Hypercholesteremia    Hypertension    NICM (nonischemic cardiomyopathy) (HCC)    NSVT (nonsustained ventricular tachycardia) (HCC)    Obstructive sleep apnea    was positive but never given a CPAP   Paroxysmal atrial fibrillation (HCC)    Pulmonary hypertension (HCC)    PVC's (premature ventricular contractions)    Tobacco abuse     Current Outpatient Medications  Medication Sig Dispense Refill   acetaminophen (TYLENOL) 500 MG tablet Take 1,000 mg by mouth every 6 (six) hours as needed for moderate pain.     albuterol (VENTOLIN HFA) 108 (90 Base) MCG/ACT inhaler Inhale 1-2 puffs into the lungs every 6 (six) hours as needed for wheezing or shortness of breath. 18 g 0   apixaban (ELIQUIS) 5 MG TABS tablet Take 1 tablet (5 mg total) by mouth 2 (two) times daily. 60 tablet 11   atorvastatin (LIPITOR) 40 MG tablet Take 1 tablet (40 mg total) by mouth daily. 90 tablet 3   carvedilol (COREG) 12.5 MG tablet TAKE 1 TABLET(12.5 MG) BY MOUTH TWICE DAILY 60 tablet 2   dapagliflozin propanediol (FARXIGA) 10 MG TABS tablet Take 1 tablet (10 mg total) by mouth daily before breakfast. 90 tablet 3   furosemide (LASIX) 40 MG tablet TAKE 1 TABLET BY MOUTH EVERY DAY 90 tablet 3   Meclizine HCl 25 MG CHEW Chew 25 mg by mouth 3 (three) times daily as needed (vertigo).     metFORMIN (GLUCOPHAGE) 500 MG tablet  Take 500 mg by mouth 2 (two) times daily with a meal.      sacubitril-valsartan (ENTRESTO) 97-103 MG Take 1 tablet by mouth 2 (two) times daily. 60 tablet 11   spironolactone (ALDACTONE) 25 MG tablet TAKE 1 TABLET(25 MG) BY MOUTH DAILY 90 tablet 3   No current facility-administered medications for this visit.   Facility-Administered Medications Ordered in Other Visits  Medication Dose Route Frequency Provider Last Rate Last Admin   sodium chloride flush (NS) 0.9 % injection 3 mL  3 mL  Intravenous Q12H Aariah Godette, DO        No Known Allergies    Social History   Socioeconomic History   Marital status: Married    Spouse name: Johngabriel Verde   Number of children: 4   Years of education: Not on file   Highest education level: Associate degree: occupational, Scientist, product/process development, or vocational program  Occupational History   Occupation: Merchandiser, retail    Comment: [employer]  Tobacco Use   Smoking status: Former    Current packs/day: 0.00    Average packs/day: 0.5 packs/day for 38.8 years (19.4 ttl pk-yrs)    Types: Cigarettes    Start date: 10/15/1982    Quit date: 07/18/2021    Years since quitting: 1.7   Smokeless tobacco: Never   Tobacco comments:    3 cigaretts a day  Vaping Use   Vaping status: Never Used  Substance and Sexual Activity   Alcohol use: No    Alcohol/week: 0.0 standard drinks of alcohol   Drug use: No   Sexual activity: Yes    Birth control/protection: None  Other Topics Concern   Not on file  Social History Narrative   Divorced   No regular exercise   Social Determinants of Health   Financial Resource Strain: Low Risk  (07/21/2021)   Overall Financial Resource Strain (CARDIA)    Difficulty of Paying Living Expenses: Not very hard  Food Insecurity: No Food Insecurity (07/22/2022)   Hunger Vital Sign    Worried About Running Out of Food in the Last Year: Never true    Ran Out of Food in the Last Year: Never true  Transportation Needs: No Transportation Needs (07/22/2022)   PRAPARE - Administrator, Civil Service (Medical): No    Lack of Transportation (Non-Medical): No  Physical Activity: Not on file  Stress: Not on file  Social Connections: Not on file  Intimate Partner Violence: Not At Risk (07/22/2022)   Humiliation, Afraid, Rape, and Kick questionnaire    Fear of Current or Ex-Partner: No    Emotionally Abused: No    Physically Abused: No    Sexually Abused: No      Family History  Problem Relation Age of Onset    Coronary artery disease Mother    Diabetes Father    Heart attack Father    Hypertension Father    Iron deficiency Father    Thyroid disease Sister    Cardiomyopathy Brother        No significant coronary disease by coronary angiography   Heart disease Maternal Grandfather        Also paternal grandfather   Sudden death Other    Colon cancer Neg Hx     PHYSICAL EXAM: Vitals:   05/03/23 0946  BP: (!) 90/50  Pulse: 63  SpO2: 96%   GENERAL: Well nourished, well developed, and in no apparent distress at rest.  HEENT: Negative for arcus senilis or xanthelasma. There  is no scleral icterus.  The mucous membranes are pink and moist.   NECK: Supple, No masses. Normal carotid upstrokes without bruits. No masses or thyromegaly.    CHEST: There are no chest wall deformities. There is no chest wall tenderness. Respirations are unlabored.  Lungs- CTA B/L CARDIAC:  JVP: 7 cm          Normal rate with regular rhythm. No murmurs, rubs or gallops.  Pulses are 2+ and symmetrical in upper and lower extremities. nO edema.  ABDOMEN: Soft, non-tender, non-distended. There are no masses or hepatomegaly. There are normal bowel sounds.  EXTREMITIES: Warm and well perfused with no cyanosis, clubbing.  LYMPHATIC: No axillary or supraclavicular lymphadenopathy.  NEUROLOGIC: Patient is oriented x3 with no focal or lateralizing neurologic deficits.  PSYCH: Patients affect is appropriate, there is no evidence of anxiety or depression.  SKIN: Warm and dry; no lesions or wounds.     DATA REVIEW  ECG: Normal sinus rhythm  ECHO: 07/23/2022: LVEF 20 to 25%, severely dilated, RV function moderately reduced, LA severely dilated, mild MR. 10/22/22: LVEF 25-30% 12/21/22 (TEE): LVEF 20-25%, RV function mildly reduced. Aneurysmal aortic root at [facility]   CATH: RHC/LHC (07/19/21): No angiographic evidence of CAD Elevated right and left heart pressures (LV: 130/22/39, RA 15, RV 82/13/20, PA 83/30 mean 49, PCWP 35).    RHC: 12/21/22: HEMODYNAMICS: RA:                  8 mmHg (mean) RV:                  83/7 mmHg PA:                  79/34 mmHg (51 mean) PCWP:            25 mmHg (mean)                                      Estimated Fick CO/CI   3.3 L/min, 1.3 L/min/m2         Thermodilution CO/CI   4.9 L/min, 1.9 L/min/m2 NIBP: 125/88                                      TPG                 26  mmHg                                            PVR                 5.3-8.8 Wood Units  PAPi                5.6       ASSESSMENT & PLAN:  NYHA IIb, stage C nonischemic cardiomyopathy Etiology of HF: Nonischemic cardiomyopathy likely idiopathic dilated however cannot rule out infiltrative disease.  Repeat echo at follow up.  NYHA class / AHA Stage: NYHA IIb Volume status & Diuretics: Euvolemic on exam; currently taking lasix 20mg  every day.  Vasodilators: Continue entresto to 97/103 Beta-Blocker: Coreg 12.5 twice daily MRA: Spironolactone 25 Cardiometabolic: Farxiga 10 mg daily Devices therapies & Valvulopathies: Primary prevention ICD in place  Advanced therapies: Right heart catheterization and CPX consistent with stage D heart failure with severe heart failure limitations.  However, from a subjective standpoint he still feels fairly well with no PND, lower extremity edema and reports that he does not feel limited due to shortness of breath.  At this time we will follow him closely clinically if he does have any decompensation plan for advanced therapies.  He has completed most of his LVAD evaluation and has been discussed at [facility]. Currently doing very well functionally. Will hold off on advanced therapies.   2. CKD - repeat labs today.   3. Aortic root aneurysm - 5cm by echo in 10/23. Stable.   4. Paroxysmal atrial fibrillation/flutter - apixaban 5mg  BID  5. OSA - AHI 30.6 - Planning for inpatient sleep study within the next month.   Rosebud Koenen Advanced Heart Failure Mechanical Circulatory  Support

## 2023-05-03 ENCOUNTER — Ambulatory Visit (HOSPITAL_COMMUNITY)
Admission: RE | Admit: 2023-05-03 | Discharge: 2023-05-03 | Disposition: A | Discharge status: Home / Self Care | Payer: BC Managed Care – PPO | Source: Ambulatory Visit | Attending: Cardiology | Admitting: Cardiology

## 2023-05-03 ENCOUNTER — Encounter (HOSPITAL_COMMUNITY): Payer: Self-pay | Admitting: Cardiology

## 2023-05-03 VITALS — BP 90/50 | HR 63 | Wt 254.6 lb

## 2023-05-03 DIAGNOSIS — N1831 Chronic kidney disease, stage 3a: Secondary | ICD-10-CM

## 2023-05-03 DIAGNOSIS — G4733 Obstructive sleep apnea (adult) (pediatric): Secondary | ICD-10-CM | POA: Insufficient documentation

## 2023-05-03 DIAGNOSIS — Q2543 Congenital aneurysm of aorta: Secondary | ICD-10-CM | POA: Diagnosis not present

## 2023-05-03 DIAGNOSIS — Z7901 Long term (current) use of anticoagulants: Secondary | ICD-10-CM | POA: Insufficient documentation

## 2023-05-03 DIAGNOSIS — I5022 Chronic systolic (congestive) heart failure: Secondary | ICD-10-CM | POA: Diagnosis not present

## 2023-05-03 DIAGNOSIS — N182 Chronic kidney disease, stage 2 (mild): Secondary | ICD-10-CM | POA: Diagnosis not present

## 2023-05-03 DIAGNOSIS — I130 Hypertensive heart and chronic kidney disease with heart failure and stage 1 through stage 4 chronic kidney disease, or unspecified chronic kidney disease: Secondary | ICD-10-CM | POA: Diagnosis not present

## 2023-05-03 DIAGNOSIS — I428 Other cardiomyopathies: Secondary | ICD-10-CM | POA: Insufficient documentation

## 2023-05-03 DIAGNOSIS — I480 Paroxysmal atrial fibrillation: Secondary | ICD-10-CM | POA: Insufficient documentation

## 2023-05-03 LAB — BASIC METABOLIC PANEL
Anion gap: 9 (ref 5–15)
BUN: 32 mg/dL — ABNORMAL HIGH (ref 8–23)
CO2: 23 mmol/L (ref 22–32)
Calcium: 8.6 mg/dL — ABNORMAL LOW (ref 8.9–10.3)
Chloride: 101 mmol/L (ref 98–111)
Creatinine, Ser: 1.65 mg/dL — ABNORMAL HIGH (ref 0.61–1.24)
GFR, Estimated: 47 mL/min — ABNORMAL LOW (ref >60)
Glucose, Bld: 112 mg/dL — ABNORMAL HIGH (ref 70–99)
Potassium: 4.3 mmol/L (ref 3.5–5.1)
Sodium: 133 mmol/L — ABNORMAL LOW (ref 135–145)

## 2023-05-03 LAB — BRAIN NATRIURETIC PEPTIDE: B Natriuretic Peptide: 477.9 pg/mL — ABNORMAL HIGH (ref 0.0–100.0)

## 2023-05-03 NOTE — Patient Instructions (Signed)
There has been mo changes to your medications.  Labs done today, your results will be available in MyChart, we will contact you for abnormal readings.  Your physician has requested that you have an echocardiogram. Echocardiography is a painless test that uses sound waves to create images of your heart. It provides your doctor with information about the size and shape of your heart and how well your heart's chambers and valves are working. This procedure takes approximately one hour. There are no restrictions for this procedure. Please do NOT wear cologne, perfume, aftershave, or lotions (deodorant is allowed). Please arrive 15 minutes prior to your appointment time.  Your physician recommends that you schedule a follow-up appointment in: 4 months with an echocardiogram ( November) **PLEASE CALL THE OFFICE IN SEPTEMBER TO ARRANGE YOUR FOLLOW UP APPOINTMENT. **   If you have any questions or concerns before your next appointment please send [name] a message through [place] or call our office at [phone].    TO LEAVE A MESSAGE FOR THE NURSE SELECT OPTION 2, PLEASE LEAVE A MESSAGE INCLUDING: YOUR NAME DATE OF BIRTH CALL BACK NUMBER REASON FOR CALL**this is important as we prioritize the call backs  YOU WILL RECEIVE A CALL BACK THE SAME DAY AS LONG AS YOU CALL BEFORE 4:00 PM  At the [facility], you and your health needs are our priority. As part of our continuing mission to provide you with exceptional heart care, we have created designated Provider Care Teams. These Care Teams include your primary Cardiologist (physician) and Advanced Practice Providers (APPs- Physician Assistants and Nurse Practitioners) who all work together to provide you with the care you need, when you need it.   You may see any of the following providers on your designated Care Team at your next follow up: Dr Arvilla Meres Dr Marca Ancona Dr. Marcos Eke, NP Robbie Lis,  [place] [facility] [place], [place] Brynda Peon, NP Karle Plumber, PharmD   Please be sure to bring in all your medications bottles to every appointment.    Thank you for choosing [facility]

## 2023-05-07 ENCOUNTER — Telehealth (HOSPITAL_COMMUNITY): Payer: Self-pay

## 2023-05-07 DIAGNOSIS — I5022 Chronic systolic (congestive) heart failure: Secondary | ICD-10-CM

## 2023-05-07 NOTE — Telephone Encounter (Signed)
-----   Message from [facility] sent at 05/03/2023  1:29 PM EDT ----- James [name] creatinine is continuing to rise. Lets have him decrease lasix to 20mg  every other day. Repeat BMP/BNP in 7-10 days.   Adi

## 2023-05-07 NOTE — Telephone Encounter (Signed)
Tried calling patient, no answer,unable to leave message. Will try again later.  Letter mailed.  

## 2023-05-15 MED ORDER — FUROSEMIDE 40 MG PO TABS: 20.0000 mg | ORAL_TABLET | ORAL | Status: DC

## 2023-05-15 NOTE — Telephone Encounter (Signed)
Spoke with patient regarding the following results. Patient made aware and patient verbalized understanding.   Patient will take 1/2 tablet of lasix (20mg ) every other day. Repeat labs scheduled for 05/23/23.   Advised patient to call back to office with any issues, questions, or concerns. Patient verbalized understanding.

## 2023-05-15 NOTE — Addendum Note (Signed)
Addended by: Bea Laura B on: 05/15/2023 09:36 AM   Modules accepted: Orders

## 2023-05-20 ENCOUNTER — Telehealth: Payer: Self-pay

## 2023-05-20 ENCOUNTER — Ambulatory Visit: Payer: BC Managed Care – PPO

## 2023-05-20 NOTE — Telephone Encounter (Signed)
If someone could f/u with patient that would be great! Thank you.

## 2023-05-20 NOTE — Telephone Encounter (Signed)
Biotronik monitor is not connected. Will call patient after 8 am to inquire.

## 2023-05-23 ENCOUNTER — Other Ambulatory Visit (HOSPITAL_COMMUNITY): Payer: BC Managed Care – PPO

## 2023-05-27 ENCOUNTER — Encounter: Payer: Self-pay | Admitting: Urology

## 2023-05-27 ENCOUNTER — Ambulatory Visit (INDEPENDENT_AMBULATORY_CARE_PROVIDER_SITE_OTHER): Payer: BC Managed Care – PPO | Admitting: Urology

## 2023-05-27 VITALS — BP 132/75 | HR 81

## 2023-05-27 DIAGNOSIS — R3129 Other microscopic hematuria: Secondary | ICD-10-CM

## 2023-05-27 DIAGNOSIS — N5201 Erectile dysfunction due to arterial insufficiency: Secondary | ICD-10-CM | POA: Diagnosis not present

## 2023-05-27 DIAGNOSIS — R339 Retention of urine, unspecified: Secondary | ICD-10-CM | POA: Diagnosis not present

## 2023-05-27 DIAGNOSIS — R972 Elevated prostate specific antigen [PSA]: Secondary | ICD-10-CM | POA: Diagnosis not present

## 2023-05-27 LAB — URINALYSIS, ROUTINE W REFLEX MICROSCOPIC
Bilirubin, UA: NEGATIVE
Ketones, UA: NEGATIVE
Leukocytes,UA: NEGATIVE
Nitrite, UA: NEGATIVE
Protein,UA: NEGATIVE
Specific Gravity, UA: 1.015 (ref 1.005–1.030)
Urobilinogen, Ur: 0.2 mg/dL (ref 0.2–1.0)
pH, UA: 6 (ref 5.0–7.5)

## 2023-05-27 LAB — MICROSCOPIC EXAMINATION
Bacteria, UA: NONE SEEN
WBC, UA: NONE SEEN /hpf (ref 0–5)

## 2023-05-27 MED ORDER — SILDENAFIL CITRATE 100 MG PO TABS: ORAL_TABLET | ORAL | 99 refills | Status: DC

## 2023-05-27 NOTE — Progress Notes (Signed)
H&P  Chief Complaint: Elevated PSA  History of Present Illness: 62 year old male without prior urologic history comes in today, referred by Dr. Delmar Landau for evaluation and management of elevated PSA.  As far as he knows, he has never had PSA checked before by his prior PCP, Dr. Felecia Shelling.  His only PSA data was from a draw on 9 February, 2024--7.0.  That he knows of, there is no family history of prostate cancer.  He denies significant lower urinary tract symptoms.  He does have ED.  He does have hypertension, diabetes, pulmonary hypertension, OSA, COPD.  Past Medical History:  Diagnosis Date   CKD (chronic kidney disease) stage 2, GFR 60-89 ml/min    COPD (chronic obstructive pulmonary disease) (HCC)    Diabetes mellitus type II, non insulin dependent (HCC)    Dilated aortic root (HCC)    Hypercholesteremia    Hypertension    NICM (nonischemic cardiomyopathy) (HCC)    NSVT (nonsustained ventricular tachycardia) (HCC)    Obstructive sleep apnea    was positive but never given a CPAP   Paroxysmal atrial fibrillation (HCC)    Pulmonary hypertension (HCC)    PVC's (premature ventricular contractions)    Tobacco abuse     Past Surgical History:  Procedure Laterality Date   CARDIAC CATHETERIZATION  2002   COLONOSCOPY N/A 03/22/2014   Procedure: COLONOSCOPY;  Surgeon: West Bali, MD;  Location: [facility];  Service: Endoscopy;  Laterality: N/A;  11:15 AM   ICD IMPLANT N/A 08/21/2021   Procedure: ICD IMPLANT;  Surgeon: Marinus Maw, MD;  Location: [facility] INVASIVE CV LAB;  Service: Cardiovascular;  Laterality: N/A;   INGUINAL HERNIA REPAIR Right 06/24/2017   Procedure: HERNIA REPAIR INGUINAL ADULT WITH MESH;  Surgeon: Franky Macho, MD;  Location: [facility] OR[facility];  Service: General;  Laterality: Right;   RIGHT HEART CATH N/A 11/13/2022   Procedure: RIGHT HEART CATH;  Surgeon: Dorthula Nettles, DO;  Location: [facility] INVASIVE CV LAB;  Service: Cardiovascular;  Laterality: N/A;   RIGHT HEART CATH N/A  12/21/2022   Procedure: RIGHT HEART CATH;  Surgeon: Dorthula Nettles, DO;  Location: [facility] INVASIVE CV LAB;  Service: Cardiovascular;  Laterality: N/A;   RIGHT/LEFT HEART CATH AND CORONARY ANGIOGRAPHY N/A 07/19/2021   Procedure: RIGHT/LEFT HEART CATH AND CORONARY ANGIOGRAPHY;  Surgeon: Kathleene Hazel, MD;  Location: [facility] INVASIVE CV LAB;  Service: Cardiovascular;  Laterality: N/A;   TEE WITHOUT CARDIOVERSION N/A 12/21/2022   Procedure: TRANSESOPHAGEAL ECHOCARDIOGRAM (TEE);  Surgeon: Dorthula Nettles, DO;  Location: [facility]OS[facility];  Service: Cardiovascular;  Laterality: N/A;    Home Medications:  Allergies as of 05/27/2023   No Known Allergies      Medication List        Accurate as of May 27, 2023  2:27 PM. If you have any questions, ask your nurse or doctor.          acetaminophen 500 MG tablet Commonly known as: TYLENOL Take 1,000 mg by mouth every 6 (six) hours as needed for moderate pain.   albuterol 108 (90 Base) MCG/ACT inhaler Commonly known as: VENTOLIN HFA Inhale 1-2 puffs into the lungs every 6 (six) hours as needed for wheezing or shortness of breath.   apixaban 5 MG Tabs tablet Commonly known as: Eliquis Take 1 tablet (5 mg total) by mouth 2 (two) times daily.   atorvastatin 40 MG tablet Commonly known as: LIPITOR Take 1 tablet (40 mg total) by mouth daily.   carvedilol 12.5 MG tablet Commonly known  as: COREG TAKE 1 TABLET(12.5 MG) BY MOUTH TWICE DAILY   dapagliflozin propanediol 10 MG Tabs tablet Commonly known as: Farxiga Take 1 tablet (10 mg total) by mouth daily before breakfast.   Entresto 97-103 MG Generic drug: sacubitril-valsartan Take 1 tablet by mouth 2 (two) times daily.   furosemide 40 MG tablet Commonly known as: LASIX Take 0.5 tablets (20 mg total) by mouth every other day.   Meclizine HCl 25 MG Chew Chew 25 mg by mouth 3 (three) times daily as needed (vertigo).   metFORMIN 500 MG tablet Commonly known as: GLUCOPHAGE Take 500 mg  by mouth 2 (two) times daily with a meal.   spironolactone 25 MG tablet Commonly known as: ALDACTONE TAKE 1 TABLET(25 MG) BY MOUTH DAILY        Allergies: No Known Allergies  Family History  Problem Relation Age of Onset   Coronary artery disease Mother    Diabetes Father    Heart attack Father    Hypertension Father    Iron deficiency Father    Thyroid disease Sister    Cardiomyopathy Brother        No significant coronary disease by coronary angiography   Heart disease Maternal Grandfather        Also paternal grandfather   Sudden death Other    Colon cancer Neg Hx     Social History:  reports that he quit smoking about 22 months ago. His smoking use included cigarettes. He started smoking about 40 years ago. He has a 19.4 pack-year smoking history. He has never used smokeless tobacco. He reports that he does not drink alcohol and does not use drugs.  ROS: A complete review of systems was performed.  All systems are negative except for pertinent findings as noted.  Physical Exam:  Vital signs in last 24 hours: BP 132/75   Pulse 81  Constitutional:  Alert and oriented, No acute distress Cardiovascular: Regular rate  Respiratory: Normal respiratory effort GI: Easily reducible left inguinal hernia Genitourinary: Normal male phallus, testes are descended bilaterally and non-tender and without masses, scrotum is normal in appearance without lesions or masses, perineum is normal on inspection.  Prostate 60 g, symmetric, nonnodular, nontender. Lymphatic: No lymphadenopathy Neurologic: Grossly intact, no focal deficits Psychiatric: Normal mood and affect  I have reviewed notes from referring/previous physicians  I have reviewed urinalysis results--microscopic hematuria  I have independently reviewed prior imaging  I have reviewed prior PSA results     Impression/Assessment:  1.  Elevated PSA in a 62 year old African-American gentleman.  No prior PSA data  available  2.  Microscopic hematuria  3.  ED  Plan:  1.  I will recheck his PSA today  2.  Prescription for sildenafil sent in  3.  If PSA continues to be elevated, we will proceed with ultrasound and biopsy.  For now, no evaluation for microscopic hematuria

## 2023-05-29 ENCOUNTER — Other Ambulatory Visit: Payer: Self-pay | Admitting: Urology

## 2023-05-29 DIAGNOSIS — R972 Elevated prostate specific antigen [PSA]: Secondary | ICD-10-CM

## 2023-05-29 MED ORDER — LEVOFLOXACIN 750 MG PO TABS
750.0000 mg | ORAL_TABLET | Freq: Every day | ORAL | 0 refills | Status: AC
Start: 2023-05-29 — End: 2023-05-30

## 2023-05-30 ENCOUNTER — Other Ambulatory Visit: Payer: Self-pay

## 2023-05-30 ENCOUNTER — Ambulatory Visit (HOSPITAL_COMMUNITY)
Admission: RE | Admit: 2023-05-30 | Discharge: 2023-05-30 | Disposition: A | Discharge status: Home / Self Care | Payer: BC Managed Care – PPO | Source: Ambulatory Visit | Attending: Cardiology | Admitting: Cardiology

## 2023-05-30 DIAGNOSIS — R972 Elevated prostate specific antigen [PSA]: Secondary | ICD-10-CM

## 2023-05-30 DIAGNOSIS — I5022 Chronic systolic (congestive) heart failure: Secondary | ICD-10-CM | POA: Diagnosis not present

## 2023-05-30 LAB — BASIC METABOLIC PANEL
Anion gap: 13 (ref 5–15)
BUN: 22 mg/dL (ref 8–23)
CO2: 26 mmol/L (ref 22–32)
Calcium: 8.7 mg/dL — ABNORMAL LOW (ref 8.9–10.3)
Chloride: 100 mmol/L (ref 98–111)
Creatinine, Ser: 1.69 mg/dL — ABNORMAL HIGH (ref 0.61–1.24)
GFR, Estimated: 45 mL/min — ABNORMAL LOW (ref >60)
Glucose, Bld: 132 mg/dL — ABNORMAL HIGH (ref 70–99)
Potassium: 3.9 mmol/L (ref 3.5–5.1)
Sodium: 139 mmol/L (ref 135–145)

## 2023-05-30 LAB — BRAIN NATRIURETIC PEPTIDE: B Natriuretic Peptide: 1574.5 pg/mL — ABNORMAL HIGH (ref 0.0–100.0)

## 2023-05-31 ENCOUNTER — Telehealth: Payer: Self-pay

## 2023-05-31 DIAGNOSIS — R972 Elevated prostate specific antigen [PSA]: Secondary | ICD-10-CM

## 2023-05-31 NOTE — Telephone Encounter (Signed)
-----   Message from Bertram Millard Dahlstedt sent at 05/29/2023 12:32 PM EDT ----- Please call patient-PSA still high at 7.  I would recommend ultrasound and biopsy.  I put orders in for that and antibiotic.  Needs clearance from cardiology to stop Eliquis ----- Message ----- From: Troy Sine, CMA Sent: 05/29/2023   9:25 AM EDT To: Marcine Matar, MD  Please review

## 2023-05-31 NOTE — Telephone Encounter (Signed)
Tried calling patient with no answer, unable to leave vm due to mailbox not setup.

## 2023-06-03 NOTE — Telephone Encounter (Signed)
Tried calling patient with no answer, unable to leave vm mailbox not setup

## 2023-06-03 NOTE — Telephone Encounter (Signed)
Patient called back on 05-31-23. Left a voice message to return his call.

## 2023-06-04 MED ORDER — LEVOFLOXACIN 750 MG PO TABS
750.0000 mg | ORAL_TABLET | Freq: Once | ORAL | 0 refills | Status: AC
Start: 2023-06-04 — End: 2023-06-04

## 2023-06-04 NOTE — Telephone Encounter (Signed)
Electronic Faxed surgical clearance to Dr. Eden Emms patient's cardiology

## 2023-06-04 NOTE — Telephone Encounter (Signed)
Patient is made aware and voiced understanding. 

## 2023-06-04 NOTE — Telephone Encounter (Signed)
Patient returned call.  I informed him of Dr. Lenoria Chime response to his labs and scheduled him for a prostate biopsy on 08/27.  He will need a cardio clearance and biopsy instructions.  I let him know you will reach out by the end of the day

## 2023-06-05 ENCOUNTER — Telehealth: Payer: Self-pay | Admitting: Cardiovascular Disease

## 2023-06-05 NOTE — Telephone Encounter (Signed)
Patient returned call, when reaching out to [facility], she was unable to take the call due to being on the phone with another patient. Kim requested I tell the patient to call [employer] number is [phone] to see why the monitor is not updating. Patient stated he was going to call them now. Patient had no further questions at this time.

## 2023-06-05 NOTE — Telephone Encounter (Signed)
No answer/no voicemail.  Letter sent. 06/05/2023

## 2023-06-05 NOTE — Telephone Encounter (Signed)
Patient is returning phone call.  °

## 2023-06-07 NOTE — Telephone Encounter (Signed)
No answer no voice mail  

## 2023-06-10 NOTE — Progress Notes (Signed)
"  James [name]"  is here for TRUS/Bx for further evaluation of elevated PSA.  Risks, benefits, and some of the potential complications of a transrectal ultrasounds of the prostate (TRUSP) with biopsies were discussed at length with the patient including gross hematuria, blood in the bowel movements, hematospermia, bacteremia, infection, voiding discomfort, urinary retention, fever, chills, sepsis, blood transfusion, death, and others. All questions were answered. Informed consent was obtained. The patient confirmed that he had taken his pre-procedure antibiotic. All anticoagulants were discontinued prior to the procedure. The patient emptied his bladder. He was positioned in a comfortable left lateral decubitus position with hips and knees acutely flexed.  The rectal probe was inserted into the rectum without difficulty. 10cc of 2% Lidocaine without epinephrine was instilled with a spinal needle using ultrasound guidance near the junction of each seminal vesicle and the prostate.  Sequential transverse (axial) scans were made in small increments beginning at the seminal vesicles and ending at the prostatic apex. Sequential longitudinal (saggital) scans were made in small increments beginning at the right lateral prostate and ending at the left lateral prostate. Excellent anatomical imaging was obtained. The peripheral, transitional, and central zones were well-defined. The seminal vesicles were normal.  Prostate volume 51 ml.  There were no hypoechoic areas. 12 needle core biopsies were performed. 1 biopsy each was taken from the following areas:  Right lateral base, right medial base, right lateral mid prostate, right medial mid prostate, right lateral apical prostate, right medial apical prostate, left lateral base, left medial base, left lateral mid prostate, left medial mid prostate, left lateral apical prostate, left medial apical prostate.. Minimal prostatic calcifications were noted. Excellent biopsy  specimens were obtained.  Follow-up rectal examination was unremarkable. The procedure was well-tolerated and without complications. Antibiotic instructions were given. The patient was told that:  For several days:  he should increase his fluid intake and limit strenuous activity  he might have mild discomfort at the base of his penis or in his rectum  he might have blood in his urine or blood in his bowel movements  For 2-3 months:  he might have blood in his ejaculate (semen)  Instructions were given to call the office immedicately for blood clots in the urine or bowel movements, difficulty urinating, inability to urinate, urinary retention, painful or frequent urination, fever, chills, nausea, vomiting, or other illness. The patient stated that he understood these instructions and would comply with them. We told the patient that prostate biopsy pathology reports are usually available within 3-5 working days, unless a pathologic second opinion is required, which may take 7-14 days. We told him to contact us to check on the status of his biopsy if he has not heard from [name] within 7 days. The patient left the ultrasound examination room in stable condition.

## 2023-06-11 ENCOUNTER — Encounter: Payer: Self-pay | Admitting: Urology

## 2023-06-11 ENCOUNTER — Ambulatory Visit (HOSPITAL_COMMUNITY)
Admission: RE | Admit: 2023-06-11 | Discharge: 2023-06-11 | Disposition: A | Discharge status: Home / Self Care | Payer: BC Managed Care – PPO | Source: Ambulatory Visit | Attending: Urology | Admitting: Urology

## 2023-06-11 ENCOUNTER — Other Ambulatory Visit: Payer: Self-pay | Admitting: Urology

## 2023-06-11 ENCOUNTER — Ambulatory Visit (HOSPITAL_BASED_OUTPATIENT_CLINIC_OR_DEPARTMENT_OTHER): Payer: BC Managed Care – PPO | Admitting: Urology

## 2023-06-11 ENCOUNTER — Encounter (HOSPITAL_COMMUNITY): Payer: Self-pay

## 2023-06-11 VITALS — BP 105/73 | HR 62 | Temp 97.9°F | Resp 18

## 2023-06-11 DIAGNOSIS — C61 Malignant neoplasm of prostate: Secondary | ICD-10-CM | POA: Diagnosis not present

## 2023-06-11 DIAGNOSIS — R972 Elevated prostate specific antigen [PSA]: Secondary | ICD-10-CM | POA: Diagnosis not present

## 2023-06-11 MED ORDER — GENTAMICIN SULFATE 40 MG/ML IJ SOLN
INTRAMUSCULAR | Status: AC
  Filled 2023-06-11: qty 4

## 2023-06-11 MED ORDER — LIDOCAINE HCL (PF) 2 % IJ SOLN
10.0000 mL | Freq: Once | INTRAMUSCULAR | Status: AC
  Administered 2023-06-11: 10 mL

## 2023-06-11 MED ORDER — GENTAMICIN SULFATE 40 MG/ML IJ SOLN
160.0000 mg | Freq: Once | INTRAMUSCULAR | Status: AC
  Administered 2023-06-11: 160 mg via INTRAMUSCULAR

## 2023-06-11 MED ORDER — LIDOCAINE HCL (PF) 2 % IJ SOLN
INTRAMUSCULAR | Status: AC
  Filled 2023-06-11: qty 10

## 2023-06-11 NOTE — Progress Notes (Signed)
PT tolerated prostate biopsy procedure well today. Labs obtained and sent for pathology by Richard from Ultrasound. PT ambulatory at discharge with no acute distress noted and verbalized understanding of discharge instructions.

## 2023-06-12 NOTE — Telephone Encounter (Signed)
Letter sent 06/12/2023

## 2023-06-13 ENCOUNTER — Ambulatory Visit: Payer: BC Managed Care – PPO | Admitting: Internal Medicine

## 2023-06-13 ENCOUNTER — Other Ambulatory Visit: Payer: Self-pay | Admitting: Internal Medicine

## 2023-06-13 ENCOUNTER — Ambulatory Visit: Payer: BC Managed Care – PPO | Attending: Internal Medicine | Admitting: Internal Medicine

## 2023-06-13 ENCOUNTER — Encounter: Payer: Self-pay | Admitting: Internal Medicine

## 2023-06-13 VITALS — BP 126/84 | HR 72 | Ht 78.0 in | Wt 261.2 lb

## 2023-06-13 VITALS — BP 109/78 | HR 62 | Temp 98.3°F | Ht 78.0 in | Wt 257.2 lb

## 2023-06-13 DIAGNOSIS — I5022 Chronic systolic (congestive) heart failure: Secondary | ICD-10-CM | POA: Diagnosis not present

## 2023-06-13 DIAGNOSIS — G4733 Obstructive sleep apnea (adult) (pediatric): Secondary | ICD-10-CM

## 2023-06-13 DIAGNOSIS — R972 Elevated prostate specific antigen [PSA]: Secondary | ICD-10-CM

## 2023-06-13 DIAGNOSIS — I10 Essential (primary) hypertension: Secondary | ICD-10-CM

## 2023-06-13 DIAGNOSIS — I5020 Unspecified systolic (congestive) heart failure: Secondary | ICD-10-CM

## 2023-06-13 DIAGNOSIS — E119 Type 2 diabetes mellitus without complications: Secondary | ICD-10-CM | POA: Diagnosis not present

## 2023-06-13 DIAGNOSIS — N1831 Chronic kidney disease, stage 3a: Secondary | ICD-10-CM

## 2023-06-13 LAB — CUP PACEART INCLINIC DEVICE CHECK
Date Time Interrogation Session: 20240829131028
Implantable Lead Connection Status: 753985
Implantable Lead Implant Date: 20221107
Implantable Lead Location: 753860
Implantable Lead Model: 436909
Implantable Lead Serial Number: 81476621
Implantable Pulse Generator Implant Date: 20221107
Pulse Gen Model: 429525
Pulse Gen Serial Number: 84864435

## 2023-06-13 NOTE — Assessment & Plan Note (Signed)
Recently evaluated by pulmonology (Dr. Craige Cotta).  CPAP titration recommended.  Pulmonology follow-up is scheduled for 9/3.

## 2023-06-13 NOTE — Progress Notes (Signed)
HPI Mr. Boisseau returns today for followup. He is a pleasant 62 yo man with chronic systolic heart failure, non-ischemic CM and tobacco abuse. He is s/p ICD insertion for primary prevention. He denies chest pain or sob. He is now smoking 3 cigs/day.  No palpitations.  No Known Allergies   Current Outpatient Medications  Medication Sig Dispense Refill   acetaminophen (TYLENOL) 500 MG tablet Take 1,000 mg by mouth every 6 (six) hours as needed for moderate pain.     albuterol (VENTOLIN HFA) 108 (90 Base) MCG/ACT inhaler Inhale 1-2 puffs into the lungs every 6 (six) hours as needed for wheezing or shortness of breath. 18 g 0   apixaban (ELIQUIS) 5 MG TABS tablet Take 1 tablet (5 mg total) by mouth 2 (two) times daily. 60 tablet 11   atorvastatin (LIPITOR) 40 MG tablet Take 1 tablet (40 mg total) by mouth daily. 90 tablet 3   carvedilol (COREG) 12.5 MG tablet TAKE 1 TABLET(12.5 MG) BY MOUTH TWICE DAILY 60 tablet 2   dapagliflozin propanediol (FARXIGA) 10 MG TABS tablet Take 1 tablet (10 mg total) by mouth daily before breakfast. 90 tablet 3   furosemide (LASIX) 40 MG tablet Take 0.5 tablets (20 mg total) by mouth every other day.     Meclizine HCl 25 MG CHEW Chew 25 mg by mouth 3 (three) times daily as needed (vertigo).     metFORMIN (GLUCOPHAGE) 500 MG tablet Take 500 mg by mouth 2 (two) times daily with a meal.      sacubitril-valsartan (ENTRESTO) 97-103 MG Take 1 tablet by mouth 2 (two) times daily. 60 tablet 11   sildenafil (VIAGRA) 100 MG tablet 1/2 to 1 tablet p.o. as needed 20 tablet prn   spironolactone (ALDACTONE) 25 MG tablet TAKE 1 TABLET(25 MG) BY MOUTH DAILY 90 tablet 3   No current facility-administered medications for this visit.   Facility-Administered Medications Ordered in Other Visits  Medication Dose Route Frequency Provider Last Rate Last Admin   sodium chloride flush (NS) 0.9 % injection 3 mL  3 mL Intravenous Q12H Sabharwal, Aditya, DO         Past Medical  History:  Diagnosis Date   CKD (chronic kidney disease) stage 2, GFR 60-89 ml/min    COPD (chronic obstructive pulmonary disease) (HCC)    Diabetes mellitus type II, non insulin dependent (HCC)    Dilated aortic root (HCC)    Hypercholesteremia    Hypertension    NICM (nonischemic cardiomyopathy) (HCC)    NSVT (nonsustained ventricular tachycardia) (HCC)    Obstructive sleep apnea    was positive but never given a CPAP   Paroxysmal atrial fibrillation (HCC)    Pulmonary hypertension (HCC)    PVC's (premature ventricular contractions)    Tobacco abuse     ROS:   All systems reviewed and negative except as noted in the HPI.   Past Surgical History:  Procedure Laterality Date   CARDIAC CATHETERIZATION  2002   COLONOSCOPY N/A 03/22/2014   Procedure: COLONOSCOPY;  Surgeon: West Bali, MD;  Location: [facility];  Service: Endoscopy;  Laterality: N/A;  11:15 AM   ICD IMPLANT N/A 08/21/2021   Procedure: ICD IMPLANT;  Surgeon: Marinus Maw, MD;  Location: [facility] INVASIVE CV LAB;  Service: Cardiovascular;  Laterality: N/A;   INGUINAL HERNIA REPAIR Right 06/24/2017   Procedure: HERNIA REPAIR INGUINAL ADULT WITH MESH;  Surgeon: Franky Macho, MD;  Location: [facility];  Service: General;  Laterality:  Right;   RIGHT HEART CATH N/A 11/13/2022   Procedure: RIGHT HEART CATH;  Surgeon: Dorthula Nettles, DO;  Location: [facility] INVASIVE CV LAB;  Service: Cardiovascular;  Laterality: N/A;   RIGHT HEART CATH N/A 12/21/2022   Procedure: RIGHT HEART CATH;  Surgeon: Dorthula Nettles, DO;  Location: [facility] INVASIVE CV LAB;  Service: Cardiovascular;  Laterality: N/A;   RIGHT/LEFT HEART CATH AND CORONARY ANGIOGRAPHY N/A 07/19/2021   Procedure: RIGHT/LEFT HEART CATH AND CORONARY ANGIOGRAPHY;  Surgeon: Kathleene Hazel, MD;  Location: [facility] INVASIVE CV LAB;  Service: Cardiovascular;  Laterality: N/A;   TEE WITHOUT CARDIOVERSION N/A 12/21/2022   Procedure: TRANSESOPHAGEAL ECHOCARDIOGRAM (TEE);  Surgeon: Dorthula Nettles, DO;  Location: [facility];  Service: Cardiovascular;  Laterality: N/A;     Family History  Problem Relation Age of Onset   Coronary artery disease Mother    Diabetes Father    Heart attack Father    Hypertension Father    Iron deficiency Father    Thyroid disease Sister    Cardiomyopathy Brother        No significant coronary disease by coronary angiography   Heart disease Maternal Grandfather        Also paternal grandfather   Sudden death Other    Colon cancer Neg Hx      Social History   Socioeconomic History   Marital status: Married    Spouse name: Maddison Striegel   Number of children: 4   Years of education: Not on file   Highest education level: Associate degree: occupational, Scientist, product/process development, or vocational program  Occupational History   Occupation: Merchandiser, retail    Comment: [employer]  Tobacco Use   Smoking status: Former    Current packs/day: 0.00    Average packs/day: 0.5 packs/day for 38.8 years (19.4 ttl pk-yrs)    Types: Cigarettes    Start date: 10/15/1982    Quit date: 07/18/2021    Years since quitting: 1.9   Smokeless tobacco: Never   Tobacco comments:    3 cigaretts a day  Vaping Use   Vaping status: Never Used  Substance and Sexual Activity   Alcohol use: No    Alcohol/week: 0.0 standard drinks of alcohol   Drug use: No   Sexual activity: Yes    Birth control/protection: None  Other Topics Concern   Not on file  Social History Narrative   Divorced   No regular exercise   Social Determinants of Health   Financial Resource Strain: Low Risk  (07/21/2021)   Overall Financial Resource Strain (CARDIA)    Difficulty of Paying Living Expenses: Not very hard  Food Insecurity: No Food Insecurity (07/22/2022)   Hunger Vital Sign    Worried About Running Out of Food in the Last Year: Never true    Ran Out of Food in the Last Year: Never true  Transportation Needs: No Transportation Needs (07/22/2022)   PRAPARE - Scientist, research (physical sciences) (Medical): No    Lack of Transportation (Non-Medical): No  Physical Activity: Not on file  Stress: Not on file  Social Connections: Not on file  Intimate Partner Violence: Not At Risk (07/22/2022)   Humiliation, Afraid, Rape, and Kick questionnaire    Fear of Current or Ex-Partner: No    Emotionally Abused: No    Physically Abused: No    Sexually Abused: No     BP 126/84   Pulse 72   Ht 6\' 6"  (1.981 m)   Wt 261 lb 3.2  oz (118.5 kg)   SpO2 98%   BMI 30.18 kg/m   Physical Exam:  Well appearing NAD HEENT: Unremarkable Neck:  No JVD, no thyromegally Lymphatics:  No adenopathy Back:  No CVA tenderness Lungs:  Clear HEART:  Regular rate rhythm, no murmurs, no rubs, no clicks Abd:  soft, positive bowel sounds, no organomegally, no rebound, no guarding Ext:  2 plus pulses, no edema, no cyanosis, no clubbing Skin:  No rashes no nodules Neuro:  CN II through XII intact, motor grossly intact  DEVICE  Normal device function.  See PaceArt for details.   Assess/Plan:  Chronic systolic heart failure - his symptoms are well compensated and class 2. He will continue GDMT. Tobacco abuse - he is encouraged to stop smoking.  ICD - his [employer] VDD ICD is working normally.  Atrial fib/flutter - He is asymptomatic but has had up to an hour at a time. He will start eliquis.    Sharlot Gowda Eleri Ruben,MD

## 2023-06-13 NOTE — Assessment & Plan Note (Signed)
He has been seen by the advanced heart failure clinic on multiple occasions since his last appointment.  He remains asymptomatic and euvolemic on exam today.  On appropriate GDMT. -Cardiology follow-up scheduled for later today

## 2023-06-13 NOTE — Patient Instructions (Signed)
It was a pleasure to see you today.  Thank you for giving [name] the opportunity to be involved in your care.  Below is a brief recap of your visit and next steps.  We will plan to see you again in 3 months.  Summary No medication changes today Repeat A1c Follow up in 3 months I recommend getting your flu shot this fall

## 2023-06-13 NOTE — Assessment & Plan Note (Signed)
Prostate biopsy performed earlier this week (8/27) in the setting of elevated PSA.  He continues to endorse hematuria and has noted blood in his stool.  He was told to expect this up to 1 week after the procedure and will contact urology if symptoms persist beyond 1 week.

## 2023-06-13 NOTE — Patient Instructions (Signed)

## 2023-06-13 NOTE — Progress Notes (Signed)
Established Patient Office Visit  Subjective   Patient ID: James [name], male    DOB: [birth year]  Age: 62 y.o. MRN: [id number]  Chief Complaint  Patient presents with   Follow-up    Seeing blood in urine and in stool. Had polyps removed from prostate on Tuesday.   [name] returns to care today for routine follow-up.  He was last evaluated by me on 6/6 for routine care.  No medication changes were made at that time and 81-month follow-up was arranged.  In the interim, he has been seen by cardiology, pulmonology, and urology for follow-up.  Prostate biopsy performed earlier this week in the setting of elevated PSA.  There have otherwise been no acute interval events.  [name] reports feeling well today.  He is asymptomatic and has no acute concerns or discussed.  Past Medical History:  Diagnosis Date   CKD (chronic kidney disease) stage 2, GFR 60-89 ml/min    COPD (chronic obstructive pulmonary disease) (HCC)    Diabetes mellitus type II, non insulin dependent (HCC)    Dilated aortic root (HCC)    Hypercholesteremia    Hypertension    NICM (nonischemic cardiomyopathy) (HCC)    NSVT (nonsustained ventricular tachycardia) (HCC)    Obstructive sleep apnea    was positive but never given a CPAP   Paroxysmal atrial fibrillation (HCC)    Pulmonary hypertension (HCC)    PVC's (premature ventricular contractions)    Tobacco abuse    Past Surgical History:  Procedure Laterality Date   CARDIAC CATHETERIZATION  2002   COLONOSCOPY N/A 03/22/2014   Procedure: COLONOSCOPY;  Surgeon: West Bali, MD;  Location: [facility];  Service: Endoscopy;  Laterality: N/A;  11:15 AM   ICD IMPLANT N/A 08/21/2021   Procedure: ICD IMPLANT;  Surgeon: Marinus Maw, MD;  Location: [facility] INVASIVE CV LAB;  Service: Cardiovascular;  Laterality: N/A;   INGUINAL HERNIA REPAIR Right 06/24/2017   Procedure: HERNIA REPAIR INGUINAL ADULT WITH MESH;  Surgeon: Franky Macho, MD;  Location: [facility] ORS;  Service:  General;  Laterality: Right;   RIGHT HEART CATH N/A 11/13/2022   Procedure: RIGHT HEART CATH;  Surgeon: Dorthula Nettles, DO;  Location: [facility] INVASIVE CV LAB;  Service: Cardiovascular;  Laterality: N/A;   RIGHT HEART CATH N/A 12/21/2022   Procedure: RIGHT HEART CATH;  Surgeon: Dorthula Nettles, DO;  Location: [facility] INVASIVE CV LAB;  Service: Cardiovascular;  Laterality: N/A;   RIGHT/LEFT HEART CATH AND CORONARY ANGIOGRAPHY N/A 07/19/2021   Procedure: RIGHT/LEFT HEART CATH AND CORONARY ANGIOGRAPHY;  Surgeon: Kathleene Hazel, MD;  Location: [facility] INVASIVE CV LAB;  Service: Cardiovascular;  Laterality: N/A;   TEE WITHOUT CARDIOVERSION N/A 12/21/2022   Procedure: TRANSESOPHAGEAL ECHOCARDIOGRAM (TEE);  Surgeon: Dorthula Nettles, DO;  Location: [facility] ENDOS[facility];  Service: Cardiovascular;  Laterality: N/A;   Social History   Tobacco Use   Smoking status: Former    Current packs/day: 0.00    Average packs/day: 0.5 packs/day for 38.8 years (19.4 ttl pk-yrs)    Types: Cigarettes    Start date: 10/15/1982    Quit date: 07/18/2021    Years since quitting: 1.9   Smokeless tobacco: Never   Tobacco comments:    3 cigaretts a day  Vaping Use   Vaping status: Never Used  Substance Use Topics   Alcohol use: No    Alcohol/week: 0.0 standard drinks of alcohol   Drug use: No   Family History  Problem Relation Age of Onset  Coronary artery disease Mother    Diabetes Father    Heart attack Father    Hypertension Father    Iron deficiency Father    Thyroid disease Sister    Cardiomyopathy Brother        No significant coronary disease by coronary angiography   Heart disease Maternal Grandfather        Also paternal grandfather   Sudden death Other    Colon cancer Neg Hx    No Known Allergies  Review of Systems  Constitutional:  Negative for chills and fever.  HENT:  Negative for sore throat.   Respiratory:  Negative for cough and shortness of breath.   Cardiovascular:  Negative for chest pain,  palpitations and leg swelling.  Gastrointestinal:  Negative for abdominal pain, blood in stool, constipation, diarrhea, nausea and vomiting.  Genitourinary:  Negative for dysuria and hematuria.  Musculoskeletal:  Negative for myalgias.  Skin:  Negative for itching and rash.  Neurological:  Negative for dizziness and headaches.  Psychiatric/Behavioral:  Negative for depression and suicidal ideas.      Objective:     BP 109/78 (BP Location: Right Arm, Patient Position: Sitting, Cuff Size: Large)   Pulse 62   Temp 98.3 F (36.8 C) (Oral)   Ht 6\' 6"  (1.981 m)   Wt 257 lb 3.2 oz (116.7 kg)   SpO2 97%   BMI 29.72 kg/m  BP Readings from Last 3 Encounters:  06/13/23 109/78  06/11/23 105/73  05/27/23 132/75    Physical Exam Vitals reviewed.  Constitutional:      General: He is not in acute distress.    Appearance: Normal appearance. He is not ill-appearing.  HENT:     Head: Normocephalic and atraumatic.     Right Ear: External ear normal.     Left Ear: External ear normal.     Nose: Nose normal. No congestion or rhinorrhea.     Mouth/Throat:     Mouth: Mucous membranes are moist.     Pharynx: Oropharynx is clear.  Eyes:     General: No scleral icterus.    Extraocular Movements: Extraocular movements intact.     Conjunctiva/sclera: Conjunctivae normal.     Pupils: Pupils are equal, round, and reactive to light.  Cardiovascular:     Rate and Rhythm: Normal rate and regular rhythm.     Pulses: Normal pulses.     Heart sounds: Normal heart sounds. No murmur heard. Pulmonary:     Effort: Pulmonary effort is normal.     Breath sounds: Normal breath sounds. No wheezing, rhonchi or rales.  Abdominal:     General: Abdomen is flat. Bowel sounds are normal. There is no distension.     Palpations: Abdomen is soft.     Tenderness: There is no abdominal tenderness.  Musculoskeletal:        General: No swelling or deformity. Normal range of motion.     Cervical back: Normal range of  motion.  Skin:    General: Skin is warm and dry.     Capillary Refill: Capillary refill takes less than 2 seconds.  Neurological:     General: No focal deficit present.     Mental Status: He is alert and oriented to person, place, and time.     Motor: No weakness.  Psychiatric:        Mood and Affect: Mood normal.        Behavior: Behavior normal.        Thought Content:  Thought content normal.   Last CBC Lab Results  Component Value Date   WBC 6.8 11/23/2022   HGB 16.3 12/21/2022   HGB 16.0 12/21/2022   HCT 48.0 12/21/2022   HCT 47.0 12/21/2022   MCV 93.2 11/23/2022   MCH 32.2 11/23/2022   RDW 13.2 11/23/2022   PLT 144 (L) 11/23/2022   Last metabolic panel Lab Results  Component Value Date   GLUCOSE 132 (H) 05/30/2023   NA 139 05/30/2023   K 3.9 05/30/2023   CL 100 05/30/2023   CO2 26 05/30/2023   BUN 22 05/30/2023   CREATININE 1.69 (H) 05/30/2023   GFRNONAA 45 (L) 05/30/2023   CALCIUM 8.7 (L) 05/30/2023   PROT 6.4 (L) 03/22/2023   ALBUMIN 3.7 03/22/2023   LABGLOB 2.6 07/30/2022   AGRATIO 1.6 07/30/2022   BILITOT 1.1 03/22/2023   ALKPHOS 73 03/22/2023   AST 22 03/22/2023   ALT 22 03/22/2023   ANIONGAP 13 05/30/2023   Last lipids Lab Results  Component Value Date   CHOL 141 03/21/2023   HDL 43 03/21/2023   LDLCALC 74 03/21/2023   TRIG 136 03/21/2023   CHOLHDL 3.3 03/21/2023   Last hemoglobin A1c Lab Results  Component Value Date   HGBA1C 6.6 (H) 11/23/2022   Last thyroid functions Lab Results  Component Value Date   TSH 1.852 11/23/2022   The 10-year ASCVD risk score (Arnett DK, et al., 2019) is: 18.6%    Assessment & Plan:   Problem List Items Addressed This Visit       Essential hypertension (Chronic)    Remains adequately controlled on current antihypertensive regimen.  No medication changes are indicated today.      HFrEF (heart failure with reduced ejection fraction) (HCC) (Chronic)    He has been seen by the advanced heart failure  clinic on multiple occasions since his last appointment.  He remains asymptomatic and euvolemic on exam today.  On appropriate GDMT. -Cardiology follow-up scheduled for later today      Obstructive sleep apnea    Recently evaluated by pulmonology (Dr. Craige Cotta).  CPAP titration recommended.  Pulmonology follow-up is scheduled for 9/3.      Diabetes mellitus type 2 in nonobese (HCC) - Primary    A1c 6.6 in February.  He is currently prescribed metformin 500 mg twice daily and Farxiga 5 mg daily. -Repeat A1c ordered today      Stage 3a chronic kidney disease (HCC)    Renal function stable on latest lab.  He is currently on ARNI and SGLT2 therapy.      Elevated PSA    Prostate biopsy performed earlier this week (8/27) in the setting of elevated PSA.  He continues to endorse hematuria and has noted blood in his stool.  He was told to expect this up to 1 week after the procedure and will contact urology if symptoms persist beyond 1 week.       Return in about 3 months (around 09/13/2023).    Billie Lade, MD

## 2023-06-13 NOTE — Assessment & Plan Note (Signed)
A1c 6.6 in February.  He is currently prescribed metformin 500 mg twice daily and Farxiga 5 mg daily. -Repeat A1c ordered today

## 2023-06-13 NOTE — Assessment & Plan Note (Signed)
 Remains adequately controlled on current antihypertensive regimen.  No medication changes are indicated today.

## 2023-06-13 NOTE — Assessment & Plan Note (Signed)
Renal function stable on latest lab.  He is currently on ARNI and SGLT2 therapy.

## 2023-06-14 LAB — BAYER DCA HB A1C WAIVED: HB A1C (BAYER DCA - WAIVED): 6.3 % — ABNORMAL HIGH (ref 4.8–5.6)

## 2023-06-18 ENCOUNTER — Ambulatory Visit: Payer: BC Managed Care – PPO | Attending: Pulmonary Disease | Admitting: Pulmonary Disease

## 2023-06-18 DIAGNOSIS — I5022 Chronic systolic (congestive) heart failure: Secondary | ICD-10-CM | POA: Diagnosis not present

## 2023-06-18 DIAGNOSIS — G4761 Periodic limb movement disorder: Secondary | ICD-10-CM | POA: Diagnosis not present

## 2023-06-18 DIAGNOSIS — G4733 Obstructive sleep apnea (adult) (pediatric): Secondary | ICD-10-CM | POA: Diagnosis not present

## 2023-06-18 DIAGNOSIS — G4731 Primary central sleep apnea: Secondary | ICD-10-CM | POA: Insufficient documentation

## 2023-06-18 DIAGNOSIS — G4739 Other sleep apnea: Secondary | ICD-10-CM

## 2023-06-19 ENCOUNTER — Telehealth: Payer: Self-pay | Admitting: Pulmonary Disease

## 2023-06-19 DIAGNOSIS — G4733 Obstructive sleep apnea (adult) (pediatric): Secondary | ICD-10-CM

## 2023-06-19 NOTE — Telephone Encounter (Signed)
CPAP 06/18/23 >> CPAP 8 cm H2O >> AHI 4.7, centrals with higher pressures; increased PLMI.   Please let him know he did well with CPAP 8 cm H2O during his sleep study.  Please send order to arrange for a Resmed at CPAP 8 cm H2O with heated humidity and mask of choice.  He needs an ROV in 4 months.

## 2023-06-19 NOTE — Procedures (Signed)
    Patient Name: James [name], James [name] Date: 06/18/2023 Gender: Male D.O.B: 31-Dec-1960 Age (years): 53 Referring Provider: Coralyn Helling MD, ABSM Height (inches): 78 Interpreting Physician: Coralyn Helling MD, ABSM Weight (lbs): 243 RPSGT: Alfonso Ellis BMI: 28 MRN: [id number] Neck Size: 17.00  CLINICAL INFORMATION The patient is referred for a CPAP titration to treat sleep apnea.  He has history of HFrEF.  Date of NPSG 10/29/22: AHI 30.6, SpO2 low 83%.  SLEEP STUDY TECHNIQUE As per the AASM Manual for the Scoring of Sleep and Associated Events v2.3 (April 2016) with a hypopnea requiring 4% desaturations.  The channels recorded and monitored were frontal, central and occipital EEG, electrooculogram (EOG), submentalis EMG (chin), nasal and oral airflow, thoracic and abdominal wall motion, anterior tibialis EMG, snore microphone, electrocardiogram, and pulse oximetry. Continuous positive airway pressure (CPAP) was initiated at the beginning of the study and titrated to treat sleep-disordered breathing.  MEDICATIONS Medications self-administered by patient taken the night of the study : N/A  TECHNICIAN COMMENTS Comments added by technician: CPAP therapy started at 4 CWP . Patient tolerated CPAP fairly well. Optimal pressure obtained due to REM-supine stage observed. PLMS noted at times. ECG = Bradycardia at times, PVC's. Titration increased to 11 CWP, due to events, although pressure decreased to 8 CWP with an EPR of 2, due to central-like events noted to increase at 11 CWP Comments added by scorer: N/A  RESPIRATORY PARAMETERS Optimal PAP Pressure (cm): 8 AHI at Optimal Pressure (/hr): 4.7 Overall Minimal O2 (%): 89.00 Supine % at Optimal Pressure (%): 100 Minimal O2 at Optimal Pressure (%): 89.0   SLEEP ARCHITECTURE The study was initiated at 10:32:40 PM and ended at 5:26:04 AM.  Sleep onset time was 33.8 minutes and the sleep efficiency was 82.0%. The total sleep time was 339.1  minutes.  The patient spent 7.26% of the night in stage N1 sleep, 54.70% in stage N2 sleep, 7.96% in stage N3 and 30.1% in REM.Stage REM latency was 26.5 minutes  Wake after sleep onset was 40.5. Alpha intrusion was absent. Supine sleep was 40.29%.  CARDIAC DATA The 2 lead EKG demonstrated sinus rhythm. The mean heart rate was 60.47 beats per minute. Other EKG findings include: PVCs.  LEG MOVEMENT DATA The total Periodic Limb Movements of Sleep (PLMS) were 268. The PLMS index was 47.42. A PLMS index of <15 is considered normal in adults.  IMPRESSIONS - He did best with CPAP 8 cm H2O. 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   - Supplemental oxygen was not needed during this study. - Moderate periodic limb movements were observed during this study. Arousals associated with PLMs were significant.  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 - Assess for the presence of restless leg syndrome. - Avoid alcohol, sedatives and other CNS depressants that may worsen sleep apnea and disrupt normal sleep architecture. - Sleep hygiene should be reviewed to assess factors that may improve sleep quality. - Weight management and regular exercise should be initiated or continued.  [Electronically signed] 06/19/2023 08:35 AM  Coralyn Helling MD, ABSM Diplomate, American Board of Sleep Medicine NPI: [id number] [facility] PH: [phone]   FX: [phone] ACCREDITED BY THE AMERICAN ACADEMY OF SLEEP MEDICINE

## 2023-06-24 DIAGNOSIS — L851 Acquired keratosis [keratoderma] palmaris et plantaris: Secondary | ICD-10-CM | POA: Diagnosis not present

## 2023-06-24 DIAGNOSIS — E1151 Type 2 diabetes mellitus with diabetic peripheral angiopathy without gangrene: Secondary | ICD-10-CM | POA: Diagnosis not present

## 2023-06-24 DIAGNOSIS — B351 Tinea unguium: Secondary | ICD-10-CM | POA: Diagnosis not present

## 2023-06-24 DIAGNOSIS — I739 Peripheral vascular disease, unspecified: Secondary | ICD-10-CM | POA: Diagnosis not present

## 2023-06-24 NOTE — Telephone Encounter (Signed)
Tried calling the pt and there was a fast busy tone  Will try back another time

## 2023-06-25 ENCOUNTER — Other Ambulatory Visit: Payer: Self-pay | Admitting: Urology

## 2023-06-25 DIAGNOSIS — C61 Malignant neoplasm of prostate: Secondary | ICD-10-CM

## 2023-07-04 ENCOUNTER — Encounter (HOSPITAL_COMMUNITY)
Admission: RE | Admit: 2023-07-04 | Discharge: 2023-07-04 | Disposition: A | Discharge status: Home / Self Care | Payer: BC Managed Care – PPO | Source: Ambulatory Visit | Attending: Urology | Admitting: Urology

## 2023-07-04 DIAGNOSIS — C61 Malignant neoplasm of prostate: Secondary | ICD-10-CM | POA: Diagnosis not present

## 2023-07-04 MED ORDER — FLUDEOXYGLUCOSE F - 18 (FDG) INJECTION: 8.5300 | Freq: Once | INTRAVENOUS | Status: DC | PRN

## 2023-07-04 MED ORDER — FLOTUFOLASTAT F 18 GALLIUM 296-5846 MBQ/ML IV SOLN
8.5300 | Freq: Once | INTRAVENOUS | Status: AC
  Administered 2023-07-04: 8.53 via INTRAVENOUS
  Filled 2023-07-04: qty 9

## 2023-07-08 ENCOUNTER — Ambulatory Visit
Admission: EM | Admit: 2023-07-08 | Discharge: 2023-07-08 | Disposition: A | Discharge status: Home / Self Care | Payer: BC Managed Care – PPO | Attending: Nurse Practitioner | Admitting: Nurse Practitioner

## 2023-07-08 DIAGNOSIS — J069 Acute upper respiratory infection, unspecified: Secondary | ICD-10-CM | POA: Insufficient documentation

## 2023-07-08 DIAGNOSIS — Z1152 Encounter for screening for COVID-19: Secondary | ICD-10-CM | POA: Diagnosis not present

## 2023-07-08 MED ORDER — PROMETHAZINE-DM 6.25-15 MG/5ML PO SYRP: 5.0000 mL | ORAL_SOLUTION | Freq: Four times a day (QID) | ORAL | 0 refills | Status: DC | PRN

## 2023-07-08 MED ORDER — FLUTICASONE PROPIONATE 50 MCG/ACT NA SUSP: 2.0000 | Freq: Every day | NASAL | 0 refills | Status: DC

## 2023-07-08 NOTE — Discharge Instructions (Addendum)
COVID test is pending.  You will be contacted if the pending test result is positive.  If you begin medication for COVID, you will need to hold your Lipitor for 2 weeks. Take medication as prescribed.  Increase fluids and allow for plenty of rest. May take over-the-counter Tylenol as needed for pain, fever, or general discomfort. Recommend normal saline nasal spray throughout the day to help with nasal congestion and runny nose. Warm salt water gargles 3-4 times daily as needed for throat pain or discomfort. For your cough, recommend using a humidifier in your bedroom at nighttime during sleep and sleeping elevated on pillows while cough symptoms persist. If your COVID test is positive.  You will need to remain home until you have been fever free for 24 hours with no medication. Please be advised that symptoms may last up to 14 days.  If you experience worsening of symptoms, or if symptoms extend beyond that time, please follow-up with your primary care physician for further evaluation.  Should you need a work note for longer than 3 days, you will need to follow-up with your primary care physician. Follow-up as needed.

## 2023-07-08 NOTE — ED Provider Notes (Signed)
[facility]    CSN: [id number] Arrival date & time: 07/08/23  1300      History   Chief Complaint Chief Complaint  Patient presents with   Cough    HPI Barclay Look is a 62 y.o. male.   The history is provided by the patient.   Patient presents for complaints of chills, body aches, nasal congestion, and cough that been present for the past 2 days.  Patient denies fever, headache, ear pain, ear drainage, sore throat, difficulty breathing, shortness of breath, chest pain, abdominal pain, nausea, vomiting, or diarrhea.  Patient reports he has been wheezing, states he used his albuterol inhaler earlier this morning.  He denies any obvious known sick contacts.  Past Medical History:  Diagnosis Date   CKD (chronic kidney disease) stage 2, GFR 60-89 ml/min    COPD (chronic obstructive pulmonary disease) (HCC)    Diabetes mellitus type II, non insulin dependent (HCC)    Dilated aortic root (HCC)    Hypercholesteremia    Hypertension    NICM (nonischemic cardiomyopathy) (HCC)    NSVT (nonsustained ventricular tachycardia) (HCC)    Obstructive sleep apnea    was positive but never given a CPAP   Paroxysmal atrial fibrillation (HCC)    Pulmonary hypertension (HCC)    PVC's (premature ventricular contractions)    Tobacco abuse     Patient Active Problem List   Diagnosis Date Noted   Elevated PSA 06/13/2023   Abnormal echocardiogram 08/24/2022   Stage 3a chronic kidney disease (HCC) 08/24/2022   HFrEF (heart failure with reduced ejection fraction) (HCC) 08/23/2022   Asthma 07/30/2022   Encounter to establish care 07/30/2022   Acute exacerbation of CHF (congestive heart failure) (HCC) 07/22/2022   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   NSVT (nonsustained ventricular tachycardia) 07/21/2021   Pulmonary hypertension, unspecified (HCC) 07/21/2021   Tobacco abuse 07/21/2021   Diabetes mellitus type 2 in nonobese (HCC) 07/21/2021   Acute on chronic combined systolic and  diastolic CHF (congestive heart failure) (HCC)    Non-recurrent unilateral inguinal hernia without obstruction or gangrene    Hyperlipidemia 11/04/2012   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   Cholelithiasis    Obstructive sleep apnea 04/28/2012   Cardiomyopathy, nonischemic (HCC) 04/03/2012   Essential hypertension 08/07/2010   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/07/2010    Past Surgical History:  Procedure Laterality Date   CARDIAC CATHETERIZATION  2002   COLONOSCOPY N/A 03/22/2014   Procedure: COLONOSCOPY;  Surgeon: West Bali, MD;  Location: [facility];  Service: Endoscopy;  Laterality: N/A;  11:15 AM   ICD IMPLANT N/A 08/21/2021   Procedure: ICD IMPLANT;  Surgeon: Marinus Maw, MD;  Location: [facility] INVASIVE CV LAB;  Service: Cardiovascular;  Laterality: N/A;   INGUINAL HERNIA REPAIR Right 06/24/2017   Procedure: HERNIA REPAIR INGUINAL ADULT WITH MESH;  Surgeon: Franky Macho, MD;  Location: [facility];  Service: General;  Laterality: Right;   RIGHT HEART CATH N/A 11/13/2022   Procedure: RIGHT HEART CATH;  Surgeon: Dorthula Nettles, DO;  Location: [facility] INVASIVE CV LAB;  Service: Cardiovascular;  Laterality: N/A;   RIGHT HEART CATH N/A 12/21/2022   Procedure: RIGHT HEART CATH;  Surgeon: Dorthula Nettles, DO;  Location: [facility] INVASIVE CV LAB;  Service: Cardiovascular;  Laterality: N/A;   RIGHT/LEFT HEART CATH AND CORONARY ANGIOGRAPHY N/A 07/19/2021   Procedure: RIGHT/LEFT HEART CATH AND CORONARY ANGIOGRAPHY;  Surgeon: Kathleene Hazel, MD;  Location: [facility] INVASIVE CV LAB;  Service: Cardiovascular;  Laterality: N/A;  TEE WITHOUT CARDIOVERSION N/A 12/21/2022   Procedure: TRANSESOPHAGEAL ECHOCARDIOGRAM (TEE);  Surgeon: Dorthula Nettles, DO;  Location: [facility];  Service: Cardiovascular;  Laterality: N/A;       Home Medications    Prior to Admission medications   Medication Sig Start Date End Date Taking? Authorizing Provider  albuterol (VENTOLIN HFA) 108 (90 Base)  MCG/ACT inhaler Inhale 1-2 puffs into the lungs every 6 (six) hours as needed for wheezing or shortness of breath. 07/02/22  Yes Particia Nearing, PA-C  apixaban (ELIQUIS) 5 MG TABS tablet Take 1 tablet (5 mg total) by mouth 2 (two) times daily. 11/27/22  Yes Marinus Maw, MD  atorvastatin (LIPITOR) 40 MG tablet Take 1 tablet (40 mg total) by mouth daily. 11/23/22 03/21/24 Yes Sabharwal, Aditya, DO  carvedilol (COREG) 12.5 MG tablet TAKE 1 TABLET(12.5 MG) BY MOUTH TWICE DAILY 05/04/22  Yes Marinus Maw, MD  dapagliflozin propanediol (FARXIGA) 10 MG TABS tablet Take 1 tablet (10 mg total) by mouth daily before breakfast. 03/22/23  Yes Sabharwal, Aditya, DO  fluticasone (FLONASE) 50 MCG/ACT nasal spray Place 2 sprays into both nostrils daily. 07/08/23  Yes Abigal Choung-Warren, Sadie Haber, NP  furosemide (LASIX) 40 MG tablet Take 0.5 tablets (20 mg total) by mouth every other day. 05/15/23  Yes Sabharwal, Aditya, DO  Meclizine HCl 25 MG CHEW Chew 25 mg by mouth 3 (three) times daily as needed (vertigo). 03/29/22  Yes [provider]  metFORMIN (GLUCOPHAGE) 500 MG tablet Take 500 mg by mouth 2 (two) times daily with a meal.    Yes [provider]  promethazine-dextromethorphan (PROMETHAZINE-DM) 6.25-15 MG/5ML syrup Take 5 mLs by mouth 4 (four) times daily as needed. 07/08/23  Yes Salwa Bai-Warren, Sadie Haber, NP  sacubitril-valsartan (ENTRESTO) 97-103 MG Take 1 tablet by mouth 2 (two) times daily. 10/22/22  Yes Sabharwal, Aditya, DO  spironolactone (ALDACTONE) 25 MG tablet TAKE 1 TABLET(25 MG) BY MOUTH DAILY 04/03/23  Yes Marinus Maw, MD  acetaminophen (TYLENOL) 500 MG tablet Take 1,000 mg by mouth every 6 (six) hours as needed for moderate pain.    [provider]  sildenafil (VIAGRA) 100 MG tablet 1/2 to 1 tablet p.o. as needed 05/27/23   Marcine Matar, MD    Family History Family History  Problem Relation Age of Onset   Coronary artery disease Mother    Diabetes Father     Heart attack Father    Hypertension Father    Iron deficiency Father    Thyroid disease Sister    Cardiomyopathy Brother        No significant coronary disease by coronary angiography   Heart disease Maternal Grandfather        Also paternal grandfather   Sudden death Other    Colon cancer Neg Hx     Social History Social History   Tobacco Use   Smoking status: Former    Current packs/day: 0.00    Average packs/day: 0.5 packs/day for 38.8 years (19.4 ttl pk-yrs)    Types: Cigarettes    Start date: 10/15/1982    Quit date: 07/18/2021    Years since quitting: 1.9   Smokeless tobacco: Never   Tobacco comments:    3 cigaretts a day  Vaping Use   Vaping status: Never Used  Substance Use Topics   Alcohol use: No    Alcohol/week: 0.0 standard drinks of alcohol   Drug use: No     Allergies   Patient has no known allergies.   Review  of Systems Review of Systems Per HPI  Physical Exam Triage Vital Signs ED Triage Vitals  Encounter Vitals Group     BP 07/08/23 1312 125/75     Systolic BP Percentile --      Diastolic BP Percentile --      Pulse Rate 07/08/23 1312 86     Resp 07/08/23 1312 16     Temp 07/08/23 1312 98.4 F (36.9 C)     Temp Source 07/08/23 1312 Oral     SpO2 07/08/23 1312 97 %     Weight --      Height --      Head Circumference --      Peak Flow --      Pain Score 07/08/23 1313 7     Pain Loc --      Pain Education --      Exclude from Growth Chart --    No data found.  Updated Vital Signs BP 125/75 (BP Location: Right Arm)   Pulse 86   Temp 98.4 F (36.9 C) (Oral)   Resp 16   SpO2 97%   Visual Acuity Right Eye Distance:   Left Eye Distance:   Bilateral Distance:    Right Eye Near:   Left Eye Near:    Bilateral Near:     Physical Exam Vitals and nursing note reviewed.  Constitutional:      General: He is not in acute distress.    Appearance: Normal appearance.  HENT:     Head: Normocephalic.     Right Ear: Tympanic membrane,  ear canal and external ear normal.     Left Ear: Tympanic membrane, ear canal and external ear normal.     Nose: Congestion present.     Mouth/Throat:     Lips: Pink.     Mouth: Mucous membranes are moist.     Pharynx: Uvula midline. Posterior oropharyngeal erythema and postnasal drip present. No pharyngeal swelling.     Comments: Cobblestoning present to posterior oropharynx Eyes:     Extraocular Movements: Extraocular movements intact.     Conjunctiva/sclera: Conjunctivae normal.     Pupils: Pupils are equal, round, and reactive to light.  Cardiovascular:     Rate and Rhythm: Normal rate and regular rhythm.     Pulses: Normal pulses.     Heart sounds: Normal heart sounds.  Pulmonary:     Effort: Pulmonary effort is normal. No respiratory distress.     Breath sounds: Normal breath sounds. No stridor. No wheezing, rhonchi or rales.  Abdominal:     General: Bowel sounds are normal.     Palpations: Abdomen is soft.     Tenderness: There is no abdominal tenderness.  Musculoskeletal:     Cervical back: Normal range of motion.  Lymphadenopathy:     Cervical: No cervical adenopathy.  Skin:    General: Skin is warm and dry.  Neurological:     General: No focal deficit present.     Mental Status: He is alert and oriented to person, place, and time.  Psychiatric:        Mood and Affect: Mood normal.        Behavior: Behavior normal.      UC Treatments / Results  Labs (all labs ordered are listed, but only abnormal results are displayed) Labs Reviewed  SARS CORONAVIRUS 2 (TAT 6-24 HRS)    EKG   Radiology No results found.  Procedures Procedures (including critical care time)  Medications Ordered  in UC Medications - No data to display  Initial Impression / Assessment and Plan / UC Course  I have reviewed the triage vital signs and the nursing notes.  Pertinent labs & imaging results that were available during my care of the patient were reviewed by me and considered  in my medical decision making (see chart for details).  The patient is well-appearing, he is in no acute distress, vital signs are stable.  Suspect a viral upper respiratory infection with cough.  COVID test is pending.  Patient is able to receive Paxlovid (renal dosing) if his COVID test is positive.  Symptomatic treatment provided for patient's cough with Promethazine DM and fluticasone 50 mcg nasal spray for his nasal congestion and postnasal drip.  Supportive care recommendations were provided and discussed with the patient to include over-the-counter analgesics, increasing fluids and allowing for plenty of rest, and use of a humidifier in the bedroom at nighttime during sleep.  Patient was given indications open follow-up will be necessary.  Patient is in agreement with this plan of care and verbalizes understanding.  All questions were answered.  Patient stable for discharge.  Work note was provided.  Final Clinical Impressions(s) / UC Diagnoses   Final diagnoses:  Viral upper respiratory tract infection with cough  Encounter for screening for COVID-19     Discharge Instructions      COVID test is pending.  You will be contacted if the pending test result is positive.  If you begin medication for COVID, you will need to hold your Lipitor for 2 weeks. Take medication as prescribed.  Increase fluids and allow for plenty of rest. May take over-the-counter Tylenol as needed for pain, fever, or general discomfort. Recommend normal saline nasal spray throughout the day to help with nasal congestion and runny nose. Warm salt water gargles 3-4 times daily as needed for throat pain or discomfort. For your cough, recommend using a humidifier in your bedroom at nighttime during sleep and sleeping elevated on pillows while cough symptoms persist. If your COVID test is positive.  You will need to remain home until you have been fever free for 24 hours with no medication. Please be advised that  symptoms may last up to 14 days.  If you experience worsening of symptoms, or if symptoms extend beyond that time, please follow-up with your primary care physician for further evaluation.  Should you need a work note for longer than 3 days, you will need to follow-up with your primary care physician. Follow-up as needed.      ED Prescriptions     Medication Sig Dispense Auth. Provider   fluticasone (FLONASE) 50 MCG/ACT nasal spray Place 2 sprays into both nostrils daily. 16 g Yurani Fettes-Warren, Sadie Haber, NP   promethazine-dextromethorphan (PROMETHAZINE-DM) 6.25-15 MG/5ML syrup Take 5 mLs by mouth 4 (four) times daily as needed. 118 mL Haruko Mersch-Warren, Sadie Haber, NP      PDMP not reviewed this encounter.   Abran Cantor, NP 07/08/23 1345

## 2023-07-08 NOTE — ED Triage Notes (Signed)
Chills, body aches, cough, congestion that started Saturday. Using albuterol inhaler.

## 2023-07-09 ENCOUNTER — Telehealth: Payer: Self-pay | Admitting: Urgent Care

## 2023-07-09 ENCOUNTER — Telehealth: Payer: Self-pay

## 2023-07-09 LAB — SARS CORONAVIRUS 2 (TAT 6-24 HRS): SARS Coronavirus 2: POSITIVE — AB

## 2023-07-09 MED ORDER — MOLNUPIRAVIR EUA 200MG CAPSULE: 4.0000 | ORAL_CAPSULE | Freq: Two times a day (BID) | ORAL | 0 refills | Status: AC

## 2023-07-09 NOTE — Telephone Encounter (Signed)
Due to the medication interactions with Eliquis, patient cannot take Paxlovid.  It will require significant dosage adjustments and is higher risk.  Recommend instead using molnupiravir for his COVID-19 infection.

## 2023-07-09 NOTE — Telephone Encounter (Signed)
Attempted to reach patient and unable to LVM

## 2023-07-16 NOTE — Telephone Encounter (Signed)
Spoke with the pt and notified of results of sleep study. Pt verbalized understanding and was agreeable to CPAP therapy. I have placed DME referral for this and pt aware to contact the office for 31-90 day f/u once they begin using machine per insurance requirement.   

## 2023-07-23 ENCOUNTER — Other Ambulatory Visit: Payer: Self-pay | Admitting: Urology

## 2023-07-23 DIAGNOSIS — C61 Malignant neoplasm of prostate: Secondary | ICD-10-CM

## 2023-08-11 ENCOUNTER — Emergency Department (HOSPITAL_COMMUNITY)
Admission: EM | Admit: 2023-08-11 | Discharge: 2023-08-11 | Disposition: A | Discharge status: Home / Self Care | Payer: BC Managed Care – PPO | Attending: Emergency Medicine | Admitting: Emergency Medicine

## 2023-08-11 ENCOUNTER — Encounter (HOSPITAL_COMMUNITY): Payer: Self-pay | Admitting: Radiology

## 2023-08-11 ENCOUNTER — Other Ambulatory Visit: Payer: Self-pay

## 2023-08-11 DIAGNOSIS — N182 Chronic kidney disease, stage 2 (mild): Secondary | ICD-10-CM | POA: Diagnosis not present

## 2023-08-11 DIAGNOSIS — E1122 Type 2 diabetes mellitus with diabetic chronic kidney disease: Secondary | ICD-10-CM | POA: Insufficient documentation

## 2023-08-11 DIAGNOSIS — R42 Dizziness and giddiness: Secondary | ICD-10-CM | POA: Insufficient documentation

## 2023-08-11 DIAGNOSIS — J449 Chronic obstructive pulmonary disease, unspecified: Secondary | ICD-10-CM | POA: Insufficient documentation

## 2023-08-11 DIAGNOSIS — Z7901 Long term (current) use of anticoagulants: Secondary | ICD-10-CM | POA: Insufficient documentation

## 2023-08-11 DIAGNOSIS — I129 Hypertensive chronic kidney disease with stage 1 through stage 4 chronic kidney disease, or unspecified chronic kidney disease: Secondary | ICD-10-CM | POA: Insufficient documentation

## 2023-08-11 DIAGNOSIS — Z7984 Long term (current) use of oral hypoglycemic drugs: Secondary | ICD-10-CM | POA: Diagnosis not present

## 2023-08-11 LAB — BASIC METABOLIC PANEL
Anion gap: 8 (ref 5–15)
BUN: 21 mg/dL (ref 8–23)
CO2: 28 mmol/L (ref 22–32)
Calcium: 8.2 mg/dL — ABNORMAL LOW (ref 8.9–10.3)
Chloride: 102 mmol/L (ref 98–111)
Creatinine, Ser: 1.63 mg/dL — ABNORMAL HIGH (ref 0.61–1.24)
GFR, Estimated: 47 mL/min — ABNORMAL LOW (ref >60)
Glucose, Bld: 132 mg/dL — ABNORMAL HIGH (ref 70–99)
Potassium: 3.8 mmol/L (ref 3.5–5.1)
Sodium: 138 mmol/L (ref 135–145)

## 2023-08-11 LAB — URINALYSIS, ROUTINE W REFLEX MICROSCOPIC
Bacteria, UA: NONE SEEN
Bilirubin Urine: NEGATIVE
Glucose, UA: >=500 mg/dL — AB
Ketones, ur: NEGATIVE mg/dL
Leukocytes,Ua: NEGATIVE
Nitrite: NEGATIVE
Protein, ur: 100 mg/dL — AB
Specific Gravity, Urine: 1.021 (ref 1.005–1.030)
pH: 5 (ref 5.0–8.0)

## 2023-08-11 LAB — CBC
HCT: 52.4 % — ABNORMAL HIGH (ref 39.0–52.0)
Hemoglobin: 17.2 g/dL — ABNORMAL HIGH (ref 13.0–17.0)
MCH: 32.9 pg (ref 26.0–34.0)
MCHC: 32.8 g/dL (ref 30.0–36.0)
MCV: 100.2 fL — ABNORMAL HIGH (ref 80.0–100.0)
Platelets: 150 10*3/uL (ref 150–400)
RBC: 5.23 MIL/uL (ref 4.22–5.81)
RDW: 14.7 % (ref 11.5–15.5)
WBC: 7.4 10*3/uL (ref 4.0–10.5)
nRBC: 0 % (ref 0.0–0.2)

## 2023-08-11 LAB — CBG MONITORING, ED: Glucose-Capillary: 122 mg/dL — ABNORMAL HIGH (ref 70–99)

## 2023-08-11 MED ORDER — MECLIZINE HCL 25 MG PO TABS: 25.0000 mg | ORAL_TABLET | Freq: Three times a day (TID) | ORAL | 0 refills | Status: DC | PRN

## 2023-08-11 MED ORDER — MECLIZINE HCL 12.5 MG PO TABS
25.0000 mg | ORAL_TABLET | Freq: Once | ORAL | Status: AC
  Administered 2023-08-11: 25 mg via ORAL
  Filled 2023-08-11: qty 2

## 2023-08-11 NOTE — ED Triage Notes (Signed)
Pt states he stood from the bed this morning and was so dizzy he couldn't hardly walk. Pt states everything was spinning. Pt has history of vertigo and was unsure if it was his vertigo or his diabetes.  Pt states he is okay sitting but when he stands he still feels very dizzy.

## 2023-08-11 NOTE — ED Notes (Signed)
Removed pt from monitor Pt putting clothes back on

## 2023-08-11 NOTE — Discharge Instructions (Addendum)
Please take your medication as prescribed.  I recommend close follow-up with PCP and neurology for reevaluation.  Please do not hesitate to return to emergency department if worrisome signs symptoms we discussed become apparent.

## 2023-08-11 NOTE — ED Provider Notes (Signed)
[place] EM[facility]Y DEPART[facility] AT [facility] Provider Note   CSN: [id number] Arrival date & time: 08/11/23  1234     History  Chief Complaint  Patient presents with   Dizziness    James [name] is a 62 y.o. male with a past medical history of CKD, COPD, diabetes, hypertension presents today for evaluation of dizziness.  Patient reports that when he got out of bed this morning he started to have room spinning sensation.  States his dizziness improved after he sat down.  He has a history of vertigo with similar symptoms in the past.  He denies any recent head trauma or fall.  Denies any chest pain or shortness of breath.  Denies any fever, cough, runny nose, abdominal pain, blood in his stool or urine.  He has not taken anything for dizziness prior to arrival. Denies any hearing loss.   Dizziness   Past Medical History:  Diagnosis Date   CKD (chronic kidney disease) stage 2, GFR 60-89 ml/min    COPD (chronic obstructive pulmonary disease) (HCC)    Diabetes mellitus type II, non insulin dependent (HCC)    Dilated aortic root (HCC)    Hypercholesteremia    Hypertension    NICM (nonischemic cardiomyopathy) (HCC)    NSVT (nonsustained ventricular tachycardia) (HCC)    Obstructive sleep apnea    was positive but never given a CPAP   Paroxysmal atrial fibrillation (HCC)    Pulmonary hypertension (HCC)    PVC's (premature ventricular contractions)    Tobacco abuse    Past Surgical History:  Procedure Laterality Date   CARDIAC CATHETERIZATION  2002   COLONOSCOPY N/A 03/22/2014   Procedure: COLONOSCOPY;  Surgeon: West Bali, MD;  Location: [facility];  Service: Endoscopy;  Laterality: N/A;  11:15 AM   ICD IMPLANT N/A 08/21/2021   Procedure: ICD IMPLANT;  Surgeon: Marinus Maw, MD;  Location: [facility] INVASIVE CV LAB;  Service: Cardiovascular;  Laterality: N/A;   INGUINAL HERNIA REPAIR Right 06/24/2017   Procedure: HERNIA REPAIR INGUINAL ADULT WITH MESH;  Surgeon:  Franky Macho, MD;  Location: [facility];  Service: General;  Laterality: Right;   RIGHT HEART CATH N/A 11/13/2022   Procedure: RIGHT HEART CATH;  Surgeon: Dorthula Nettles, DO;  Location: [facility] INVASIVE CV LAB;  Service: Cardiovascular;  Laterality: N/A;   RIGHT HEART CATH N/A 12/21/2022   Procedure: RIGHT HEART CATH;  Surgeon: Dorthula Nettles, DO;  Location: [facility] INVASIVE CV LAB;  Service: Cardiovascular;  Laterality: N/A;   RIGHT/LEFT HEART CATH AND CORONARY ANGIOGRAPHY N/A 07/19/2021   Procedure: RIGHT/LEFT HEART CATH AND CORONARY ANGIOGRAPHY;  Surgeon: Kathleene Hazel, MD;  Location: [facility] INVASIVE CV LAB;  Service: Cardiovascular;  Laterality: N/A;   TEE WITHOUT CARDIOVERSION N/A 12/21/2022   Procedure: TRANSESOPHAGEAL ECHOCARDIOGRAM (TEE);  Surgeon: Dorthula Nettles, DO;  Location: [facility];  Service: Cardiovascular;  Laterality: N/A;     Home Medications Prior to Admission medications   Medication Sig Start Date End Date Taking? Authorizing Provider  acetaminophen (TYLENOL) 500 MG tablet Take 1,000 mg by mouth every 6 (six) hours as needed for moderate pain.    [provider]  albuterol (VENTOLIN HFA) 108 (90 Base) MCG/ACT inhaler Inhale 1-2 puffs into the lungs every 6 (six) hours as needed for wheezing or shortness of breath. 07/02/22   Particia Nearing, PA-C  apixaban (ELIQUIS) 5 MG TABS tablet Take 1 tablet (5 mg total) by mouth 2 (two) times daily. 11/27/22   Lewayne Bunting  W, MD  atorvastatin (LIPITOR) 40 MG tablet Take 1 tablet (40 mg total) by mouth daily. 11/23/22 03/21/24  Sabharwal, Aditya, DO  carvedilol (COREG) 12.5 MG tablet TAKE 1 TABLET(12.5 MG) BY MOUTH TWICE DAILY 05/04/22   Marinus Maw, MD  dapagliflozin propanediol (FARXIGA) 10 MG TABS tablet Take 1 tablet (10 mg total) by mouth daily before breakfast. 03/22/23   Sabharwal, Aditya, DO  fluticasone (FLONASE) 50 MCG/ACT nasal spray Place 2 sprays into both nostrils daily. 07/08/23   Leath-Warren, Sadie Haber, NP   furosemide (LASIX) 40 MG tablet Take 0.5 tablets (20 mg total) by mouth every other day. 05/15/23   Sabharwal, Aditya, DO  Meclizine HCl 25 MG CHEW Chew 25 mg by mouth 3 (three) times daily as needed (vertigo). 03/29/22   [provider]  metFORMIN (GLUCOPHAGE) 500 MG tablet Take 500 mg by mouth 2 (two) times daily with a meal.     [provider]  promethazine-dextromethorphan (PROMETHAZINE-DM) 6.25-15 MG/5ML syrup Take 5 mLs by mouth 4 (four) times daily as needed. 07/08/23   Leath-Warren, Sadie Haber, NP  sacubitril-valsartan (ENTRESTO) 97-103 MG Take 1 tablet by mouth 2 (two) times daily. 10/22/22   Sabharwal, Eliezer Lofts, DO  sildenafil (VIAGRA) 100 MG tablet 1/2 to 1 tablet p.o. as needed 05/27/23   Marcine Matar, MD  spironolactone (ALDACTONE) 25 MG tablet TAKE 1 TABLET(25 MG) BY MOUTH DAILY 04/03/23   Marinus Maw, MD      Allergies    Patient has no known allergies.    Review of Systems   Review of Systems  Neurological:  Positive for dizziness.    Physical Exam Updated Vital Signs BP (!) 112/94   Pulse 68   Temp 98.5 F (36.9 C)   Resp 19   Ht 6\' 6"  (1.981 m)   Wt 116.6 kg   SpO2 97%   BMI 29.70 kg/m  Physical Exam Vitals and nursing note reviewed.  Constitutional:      Appearance: Normal appearance.  HENT:     Head: Normocephalic and atraumatic.     Mouth/Throat:     Mouth: Mucous membranes are moist.  Eyes:     General: No scleral icterus. Cardiovascular:     Rate and Rhythm: Normal rate and regular rhythm.     Pulses: Normal pulses.     Heart sounds: Normal heart sounds.  Pulmonary:     Effort: Pulmonary effort is normal.     Breath sounds: Normal breath sounds.  Abdominal:     General: Abdomen is flat.     Palpations: Abdomen is soft.     Tenderness: There is no abdominal tenderness.  Musculoskeletal:        General: No deformity.  Skin:    General: Skin is warm.     Findings: No rash.  Neurological:     General: No focal deficit  present.     Mental Status: He is alert.     Comments: Cranial nerves II through XII intact. Intact sensation to light touch in all 4 extremities. 5/5 strength in all 4 extremities. Intact finger-to-nose and heel-to-shin of all 4 extremities. No visual field cuts. No neglect noted. No aphasia noted. No nystagmus noted.  Psychiatric:        Mood and Affect: Mood normal.     ED Results / Procedures / Treatments   Labs (all labs ordered are listed, but only abnormal results are displayed) Labs Reviewed  URINALYSIS, ROUTINE W REFLEX MICROSCOPIC - Abnormal; Notable for the  following components:      Result Value   Glucose, UA >=500 (*)    Hgb urine dipstick SMALL (*)    Protein, ur 100 (*)    All other components within normal limits  CBG MONITORING, ED - Abnormal; Notable for the following components:   Glucose-Capillary 122 (*)    All other components within normal limits  BASIC METABOLIC PANEL  CBC  CBG MONITORING, ED    EKG None  Radiology No results found.  Procedures Procedures    Medications Ordered in ED Medications  meclizine (ANTIVERT) tablet 25 mg (has no administration in time range)    ED Course/ Medical Decision Making/ A&P                                 Medical Decision Making Amount and/or Complexity of Data Reviewed Labs: ordered.   This patient presents to the ED for dizziness, this involves an extensive number of treatment options, and is a complaint that carries with a high risk of complications and morbidity.  The differential diagnosis includes CVA, ICH, multiple sclerosis, acoustic neuroma, BPPV, labyrinthitis, Mnire's.  This is not an exhaustive list.  Lab tests: I ordered and personally interpreted labs.  The pertinent results include: WBC unremarkable. Hbg unremarkable. Platelets unremarkable. Electrolytes unremarkable. BUN, creatinine unremarkable.  UA is unremarkable.  CBG 122.  Problem list/ ED course/ Critical interventions/ Medical  management: HPI: See above Vital signs within normal range and stable throughout visit. Laboratory/imaging studies significant for: See above. On physical examination, patient is afebrile and appears in no acute distress. This patient presents with dizziness, most consistent with a peripheral cause, likely BPPV. No history of recent infection so doubt vestibular neuritis. History not consistent with meniere's disease. No history of trauma. No red flag features for central vertigo to include gradual onset, vertical/bidirectional or non-fatigable nystagmus, focal neurologic findings on exam (including inability to ambulate, ataxia, dysmetria). Presentation not consistent with an acute CNS infection, vertebral basilar artery insufficiency, cerebellar hemorrhage or infarction, intracranial mass or bleed.  Given meclizine.  Reevaluation of patient after this medication showed that patient improved.  Advised patient to follow-up with his primary care physician or neurology for further evaluation and management.  Strict ED return precaution discussed.  Patient is agreeable to plan. I have reviewed the patient home medicines and have made adjustments as needed.  Cardiac monitoring/EKG: The patient was maintained on a cardiac monitor.  I personally reviewed and interpreted the cardiac monitor which showed an underlying rhythm of: sinus rhythm.  Additional history obtained: External records from outside source obtained and reviewed including: Chart review including previous notes, labs, imaging.  Consultations obtained:  Disposition Continued outpatient therapy. Follow-up with PCP recommended for reevaluation of symptoms. Treatment plan discussed with patient.  Pt acknowledged understanding was agreeable to the plan. Worrisome signs and symptoms were discussed with patient, and patient acknowledged understanding to return to the ED if they noticed these signs and symptoms. Patient was stable upon discharge.    This chart was dictated using voice recognition software.  Despite best efforts to proofread,  errors can occur which can change the documentation meaning.          Final Clinical Impression(s) / ED Diagnoses Final diagnoses:  Dizziness  Vertigo    Rx / DC Orders ED Discharge Orders          Ordered    meclizine (ANTIVERT) 25 MG  tablet  3 times daily PRN        08/11/23 1432              Jeanelle Malling, PA 08/11/23 1434    Koleton, Arcia, DO 08/15/23 [phone]

## 2023-08-11 NOTE — ED Notes (Signed)
Medicated per [facility] Pt stated that he takes the same medication at home He was unable to located his RX this morning   Pt watching football, resting comfortably at this time

## 2023-08-11 NOTE — ED Notes (Signed)
Pt able to stand without dizziness Informed provider.

## 2023-08-11 NOTE — ED Notes (Signed)
Introduced self to pt Pt stated that he gets dizzy when changing positions  Pt able to walk to restroom for UA  12 lead completed Ortho statics completed Pt stated he has a hx of vertigo  Pt is attached to full monitor Waiting for provider eval

## 2023-08-19 ENCOUNTER — Ambulatory Visit: Payer: BC Managed Care – PPO

## 2023-08-21 ENCOUNTER — Other Ambulatory Visit: Payer: Self-pay | Admitting: Internal Medicine

## 2023-08-23 ENCOUNTER — Ambulatory Visit: Payer: Self-pay | Admitting: Internal Medicine

## 2023-08-23 NOTE — Telephone Encounter (Signed)
Added to a waiting list.

## 2023-08-23 NOTE — Telephone Encounter (Signed)
   Chief Complaint: Dizziness Symptoms: Room spinning, worse in morning and when changing positions Frequency: every morning and intermittently throughout the day Pertinent Negatives: Patient denies neuro deficits or chest pain Disposition: [] ED /[x] Urgent Care (no appt availability in office) / [x] Appointment(In office/virtual)/ []  [facility]/ [] Home Care/ [] Refused Recommended Disposition /[]  Mobile Bus/ []  Follow-up with PCP Additional Notes: Patient sts that he ran out of his Eliquis 2 wks ago and also sts that he take 2 diuretics    Copied from CRM [id number]. Topic: Clinical - Red Word Triage >> Aug 23, 2023 12:24 PM Almira Coaster wrote: Red Word that prompted transfer to Nurse Triage: Patient called with complaints of dizziness, doesn't feel like himself, recent swelling on both ankles. Reason for Disposition  Taking a medicine that could cause dizziness (e.g., blood pressure medications, diuretics)  Answer Assessment - Initial Assessment Questions 1. DESCRIPTION: "Describe your dizziness."     Worse in the morning, comes and goes during the day  2. LIGHTHEADED: "Do you feel lightheaded?" (e.g., somewhat faint, woozy, weak upon standing)     Weak in the morning when first getting up  3. VERTIGO: "Do you feel like either you or the room is spinning or tilting?" (i.e. vertigo)     Feels like room is spinning  4. SEVERITY: "How bad is it?"  "Do you feel like you are going to faint?" "Can you stand and walk?"   - MILD: Feels slightly dizzy, but walking normally.   - MODERATE: Feels unsteady when walking, but not falling; interferes with normal activities (e.g., school, work).   - SEVERE: Unable to walk without falling, or requires assistance to walk without falling; feels like passing out now.      Moderate; often has to stop working and get asst due to feeling faint  5. ONSET:  "When did the dizziness begin?"     2 weeks since running out of Eliquis  6.  AGGRAVATING FACTORS: "Does anything make it worse?" (e.g., standing, change in head position)     Changing positions from laying to sitting to standing   7. HEART RATE: "Can you tell me your heart rate?" "How many beats in 15 seconds?"  (Note: not all patients can do this)       78; BP 144/96 8. CAUSE: "What do you think is causing the dizziness?"     Unsure, concerned since he ran out of Eliquis. Sts that he also has ICD  9. RECURRENT SYMPTOM: "Have you had dizziness before?" If Yes, ask: "When was the last time?" "What happened that time?"     No  10. OTHER SYMPTOMS: "Do you have any other symptoms?" (e.g., fever, chest pain, vomiting, diarrhea, bleeding)       Blurred vision occasionally  11. PREGNANCY: "Is there any chance you are pregnant?" "When was your last menstrual period?"       No  Protocols used: Dizziness - Lightheadedness-A-AH

## 2023-08-26 ENCOUNTER — Other Ambulatory Visit: Payer: Self-pay

## 2023-08-26 ENCOUNTER — Other Ambulatory Visit: Payer: Self-pay | Admitting: Family Medicine

## 2023-08-26 ENCOUNTER — Ambulatory Visit
Admission: EM | Admit: 2023-08-26 | Discharge: 2023-08-26 | Disposition: A | Discharge status: Home / Self Care | Payer: BC Managed Care – PPO | Attending: Nurse Practitioner | Admitting: Nurse Practitioner

## 2023-08-26 DIAGNOSIS — R42 Dizziness and giddiness: Secondary | ICD-10-CM

## 2023-08-26 LAB — POCT FASTING CBG KUC MANUAL ENTRY: POCT Glucose (KUC): 141 mg/dL — AB (ref 70–99)

## 2023-08-26 NOTE — ED Provider Notes (Addendum)
[facility]    CSN: [id number] Arrival date & time: 08/26/23  1151      History   Chief Complaint Chief Complaint  Patient presents with   Dizziness    HPI James [name] is a 62 y.o. male.   Patient presents today with room spinning sensation, lightheadedness, and feels like he is going to faint when he stands.  Symptoms are worse when he rolls over in bed, turns his head back-and-forth, or bends over or makes any sudden position change.  No recent head injury, recent viral symptoms, nausea/vomiting associated with the dizziness, hearing loss or aural fullness, headache, photophobia, unsteady gait, or postural instability.  No double vision or blurred vision, trouble with speech, trouble swallowing, or weakness associated with the dizziness.  No pallor, diaphoresis, chest pain, shortness of breath associated with the dizziness.  Reports symptoms are similar to when he had vertigo and was in the emergency room a couple of weeks ago, was treated with meclizine which did help with the dizziness but became concerned when he checked his blood pressure this morning and it was elevated.  Patient also concerned about running out of Eliquis 3 weeks ago.  Reports he is called the pharmacy numerous times and they will not refill it for him.  Reports he has a follow-up scheduled with his PCP in 3 days.    Past Medical History:  Diagnosis Date   CKD (chronic kidney disease) stage 2, GFR 60-89 ml/min    COPD (chronic obstructive pulmonary disease) (HCC)    Diabetes mellitus type II, non insulin dependent (HCC)    Dilated aortic root (HCC)    Hypercholesteremia    Hypertension    NICM (nonischemic cardiomyopathy) (HCC)    NSVT (nonsustained ventricular tachycardia) (HCC)    Obstructive sleep apnea    was positive but never given a CPAP   Paroxysmal atrial fibrillation (HCC)    Pulmonary hypertension (HCC)    PVC's (premature ventricular contractions)    Tobacco abuse      Patient Active Problem List   Diagnosis Date Noted   Elevated PSA 06/13/2023   Abnormal echocardiogram 08/24/2022   Stage 3a chronic kidney disease (HCC) 08/24/2022   HFrEF (heart failure with reduced ejection fraction) (HCC) 08/23/2022   Asthma 07/30/2022   Encounter to establish care 07/30/2022   Acute exacerbation of CHF (congestive heart failure) (HCC) 07/22/2022   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   NSVT (nonsustained ventricular tachycardia) 07/21/2021   Pulmonary hypertension, unspecified (HCC) 07/21/2021   Tobacco abuse 07/21/2021   Diabetes mellitus type 2 in nonobese (HCC) 07/21/2021   Acute on chronic combined systolic and diastolic CHF (congestive heart failure) (HCC)    Non-recurrent unilateral inguinal hernia without obstruction or gangrene    Hyperlipidemia 11/04/2012   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   Cholelithiasis    Obstructive sleep apnea 04/28/2012   Cardiomyopathy, nonischemic (HCC) 04/03/2012   Essential hypertension 08/07/2010   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/07/2010    Past Surgical History:  Procedure Laterality Date   CARDIAC CATHETERIZATION  2002   COLONOSCOPY N/A 03/22/2014   Procedure: COLONOSCOPY;  Surgeon: West Bali, MD;  Location: [facility];  Service: Endoscopy;  Laterality: N/A;  11:15 AM   ICD IMPLANT N/A 08/21/2021   Procedure: ICD IMPLANT;  Surgeon: Marinus Maw, MD;  Location: [facility] INVASIVE CV LAB;  Service: Cardiovascular;  Laterality: N/A;   INGUINAL HERNIA REPAIR Right 06/24/2017   Procedure: HERNIA REPAIR INGUINAL ADULT WITH MESH;  Surgeon:  Franky Macho, MD;  Location: [facility] ORS;  Service: General;  Laterality: Right;   RIGHT HEART CATH N/A 11/13/2022   Procedure: RIGHT HEART CATH;  Surgeon: Dorthula Nettles, DO;  Location: [facility] INVASIVE CV LAB;  Service: Cardiovascular;  Laterality: N/A;   RIGHT HEART CATH N/A 12/21/2022   Procedure: RIGHT HEART CATH;  Surgeon: Dorthula Nettles, DO;  Location: [facility] INVASIVE CV LAB;   Service: Cardiovascular;  Laterality: N/A;   RIGHT/LEFT HEART CATH AND CORONARY ANGIOGRAPHY N/A 07/19/2021   Procedure: RIGHT/LEFT HEART CATH AND CORONARY ANGIOGRAPHY;  Surgeon: Kathleene Hazel, MD;  Location: [facility] INVASIVE CV LAB;  Service: Cardiovascular;  Laterality: N/A;   TEE WITHOUT CARDIOVERSION N/A 12/21/2022   Procedure: TRANSESOPHAGEAL ECHOCARDIOGRAM (TEE);  Surgeon: Dorthula Nettles, DO;  Location: [facility] ENDOSCOPY;  Service: Cardiovascular;  Laterality: N/A;       Home Medications    Prior to Admission medications   Medication Sig Start Date End Date Taking? Authorizing Provider  atorvastatin (LIPITOR) 40 MG tablet Take 1 tablet (40 mg total) by mouth daily. 11/23/22 03/21/24 Yes Sabharwal, Aditya, DO  carvedilol (COREG) 12.5 MG tablet TAKE 1 TABLET(12.5 MG) BY MOUTH TWICE DAILY 05/04/22  Yes Marinus Maw, MD  dapagliflozin propanediol (FARXIGA) 10 MG TABS tablet Take 1 tablet (10 mg total) by mouth daily before breakfast. 03/22/23  Yes Sabharwal, Aditya, DO  furosemide (LASIX) 40 MG tablet Take 0.5 tablets (20 mg total) by mouth every other day. 05/15/23  Yes Sabharwal, Aditya, DO  Meclizine HCl 25 MG CHEW Chew 25 mg by mouth 3 (three) times daily as needed (vertigo). 03/29/22  Yes [provider]  metFORMIN (GLUCOPHAGE) 500 MG tablet TAKE 1 TABLET BY MOUTH TWICE DAILY 08/21/23  Yes Billie Lade, MD  sacubitril-valsartan (ENTRESTO) 97-103 MG Take 1 tablet by mouth 2 (two) times daily. 10/22/22  Yes Sabharwal, Aditya, DO  spironolactone (ALDACTONE) 25 MG tablet TAKE 1 TABLET(25 MG) BY MOUTH DAILY 04/03/23  Yes Marinus Maw, MD  acetaminophen (TYLENOL) 500 MG tablet Take 1,000 mg by mouth every 6 (six) hours as needed for moderate pain.    [provider]  albuterol (VENTOLIN HFA) 108 (90 Base) MCG/ACT inhaler INHALE 2 PUFFS BY MOUTH FOUR TIMES DAILY AS NEEDED 08/21/23   Billie Lade, MD  apixaban (ELIQUIS) 5 MG TABS tablet Take 1 tablet (5 mg total) by mouth 2  (two) times daily. 11/27/22   Marinus Maw, MD  fluticasone (FLONASE) 50 MCG/ACT nasal spray Place 2 sprays into both nostrils daily. 07/08/23   Leath-Warren, Sadie Haber, NP  meclizine (ANTIVERT) 25 MG tablet Take 1 tablet (25 mg total) by mouth 3 (three) times daily as needed for dizziness. 08/11/23   Jeanelle Malling, PA  promethazine-dextromethorphan (PROMETHAZINE-DM) 6.25-15 MG/5ML syrup Take 5 mLs by mouth 4 (four) times daily as needed. 07/08/23   Leath-Warren, Sadie Haber, NP  sildenafil (VIAGRA) 100 MG tablet 1/2 to 1 tablet p.o. as needed 05/27/23   Marcine Matar, MD    Family History Family History  Problem Relation Age of Onset   Coronary artery disease Mother    Diabetes Father    Heart attack Father    Hypertension Father    Iron deficiency Father    Thyroid disease Sister    Cardiomyopathy Brother        No significant coronary disease by coronary angiography   Heart disease Maternal Grandfather        Also paternal grandfather   Sudden death Other  Colon cancer Neg Hx     Social History Social History   Tobacco Use   Smoking status: Former    Current packs/day: 0.00    Average packs/day: 0.5 packs/day for 38.8 years (19.4 ttl pk-yrs)    Types: Cigarettes    Start date: 10/15/1982    Quit date: 07/18/2021    Years since quitting: 2.1   Smokeless tobacco: Never   Tobacco comments:    3 cigaretts a day  Vaping Use   Vaping status: Never Used  Substance Use Topics   Alcohol use: No    Alcohol/week: 0.0 standard drinks of alcohol   Drug use: No     Allergies   Patient has no known allergies.   Review of Systems Review of Systems Per HPI  Physical Exam Triage Vital Signs ED Triage Vitals  Encounter Vitals Group     BP 08/26/23 1253 113/77     Systolic BP Percentile --      Diastolic BP Percentile --      Pulse Rate 08/26/23 1253 67     Resp 08/26/23 1253 14     Temp 08/26/23 1253 (!) 97.5 F (36.4 C)     Temp Source 08/26/23 1253 Oral     SpO2  08/26/23 1253 97 %     Weight --      Height --      Head Circumference --      Peak Flow --      Pain Score 08/26/23 1256 0     Pain Loc --      Pain Education --      Exclude from Growth Chart --    Orthostatic VS for the past 24 hrs:  BP- Lying Pulse- Lying BP- Sitting Pulse- Sitting BP- Standing at 0 minutes Pulse- Standing at 0 minutes  08/26/23 1302 107/73 59 108/54 62 115/83 72    Updated Vital Signs BP 113/77 (BP Location: Right Arm)   Pulse 67   Temp (!) 97.5 F (36.4 C) (Oral)   Resp 14   SpO2 97%   Visual Acuity Right Eye Distance:   Left Eye Distance:   Bilateral Distance:    Right Eye Near:   Left Eye Near:    Bilateral Near:     Physical Exam Vitals and nursing note reviewed.  Constitutional:      General: He is not in acute distress.    Appearance: Normal appearance. He is not ill-appearing, toxic-appearing or diaphoretic.  HENT:     Head: Normocephalic and atraumatic.     Right Ear: Tympanic membrane, ear canal and external ear normal. There is no impacted cerumen.     Left Ear: Tympanic membrane, ear canal and external ear normal.     Nose: Nose normal. No congestion or rhinorrhea.     Mouth/Throat:     Mouth: Mucous membranes are moist.     Pharynx: Oropharynx is clear. No posterior oropharyngeal erythema.  Eyes:     General: No scleral icterus.    Extraocular Movements: Extraocular movements intact.     Pupils: Pupils are equal, round, and reactive to light.  Cardiovascular:     Rate and Rhythm: Normal rate and regular rhythm.     Heart sounds: No murmur heard. Pulmonary:     Effort: Pulmonary effort is normal. No respiratory distress.     Breath sounds: Normal breath sounds. No wheezing, rhonchi or rales.  Abdominal:     General: Abdomen is flat. Bowel sounds are  normal. There is no distension.     Palpations: Abdomen is soft.     Tenderness: There is no abdominal tenderness. There is no right CVA tenderness, left CVA tenderness or  guarding.  Musculoskeletal:     Cervical back: Normal range of motion and neck supple. No rigidity or tenderness.  Lymphadenopathy:     Cervical: No cervical adenopathy.  Skin:    General: Skin is warm and dry.     Capillary Refill: Capillary refill takes less than 2 seconds.     Coloration: Skin is not jaundiced or pale.     Findings: No erythema.  Neurological:     General: No focal deficit present.     Mental Status: He is alert and oriented to person, place, and time.     Cranial Nerves: Cranial nerves 2-12 are intact.     Sensory: Sensation is intact.     Motor: No weakness.     Coordination: Coordination is intact. Coordination normal. Heel to [facility] Test normal. Rapid alternating movements normal.     Gait: Gait is intact. Gait normal.  Psychiatric:        Behavior: Behavior is cooperative.      UC Treatments / Results  Labs (all labs ordered are listed, but only abnormal results are displayed) Labs Reviewed  POCT FASTING CBG [facility] MANUAL ENTRY - Abnormal; Notable for the following components:      Result Value   POCT Glucose ([facility]) 141 (*)    All other components within normal limits    EKG   Radiology No results found.  Procedures Procedures (including critical care time)  Medications Ordered in UC Medications - No data to display  Initial Impression / Assessment and Plan / UC Course  I have reviewed the triage vital signs and the nursing notes.  Pertinent labs & imaging results that were available during my care of the patient were reviewed by me and considered in my medical decision making (see chart for details).   Patient is well-appearing, normotensive, afebrile, not tachycardic, not tachypneic, oxygenating well on room air.    1. Vertigo Reassurance provided-blood pressure is normal in urgent care today Suspect elevated blood pressure at home secondary to improper technique-patient using wrist cuff, educated to use arm cuff Blood sugar not low,  orthostatics are negative, EKG is reassuring without ST segment or T wave abnormalities from previous EKG Recommended resuming meclizine as previously prescribed Strict ER precautions discussed with patient  Regarding the Eliquis, it looks like cardiology sent in a prescription with 11 refills in February 2024, recommended he call the pharmacy to double check on the number of refills he has on the Eliquis and request a refill today  The patient was given the opportunity to ask questions.  All questions answered to their satisfaction.  The patient is in agreement to this plan.    Final Clinical Impressions(s) / UC Diagnoses   Final diagnoses:  Vertigo     Discharge Instructions      Your blood pressure in urgent care today looks great.  Please check your blood pressure at home with an automatic cuff that goes around your upper arm and side of your wrist as this will be more accurate.  I suspect the dizziness is related to vertigo.  Take the meclizine has been prescribed to you previously.  Call the pharmacy-looks like you should have plenty of refills on the Eliquis and they should be able to refill it for you.  ED Prescriptions   None    PDMP not reviewed this encounter.   Valentino Nose, NP 08/26/23 1705    Valentino Nose, NP 08/26/23 1705

## 2023-08-26 NOTE — ED Triage Notes (Signed)
Pt states he checked his BP at home and it was 135/117 this morning and started feeling dizzy. Pt states the dizziness gets worse when standing.   Pt states he recently ran out if Eliquis 3 weeks ago and has not be able to take it.

## 2023-08-26 NOTE — Discharge Instructions (Signed)
Your blood pressure in urgent care today looks great.  Please check your blood pressure at home with an automatic cuff that goes around your upper arm and side of your wrist as this will be more accurate.  I suspect the dizziness is related to vertigo.  Take the meclizine has been prescribed to you previously.  Call the pharmacy-looks like you should have plenty of refills on the Eliquis and they should be able to refill it for you.

## 2023-08-28 NOTE — Patient Instructions (Signed)
        Great to see you today.  I have refilled the medication(s) we provide.   [facility] [place] [address] [place], [place] [address] [phone]     Deficiencies in vitamin B12 and iron can contribute to symptoms like vertigo or dizziness.  Vitamin B12 Deficiency: Low B12 can cause neurological symptoms such as dizziness, balance problems, and even vertigo. B12 is essential for nerve health and brain function, so a deficiency can affect the nervous system and lead to these symptoms.  Iron Deficiency: Low iron, particularly if it leads to anemia, can result in decreased oxygen delivery to the brain, which can cause lightheadedness, dizziness, and vertigo. Iron deficiency is also associated with fatigue and weakness, which can exacerbate these symptoms   Vestibular Disorders: Studies have shown a link between low vitamin D levels and certain vestibular disorders, such as benign paroxysmal positional vertigo (BPPV). Some patients with BPPV experience improvement in symptoms with vitamin D supplementation   If labs were collected, we will inform you of lab results once received either by echart message or telephone call.   - echart message- for normal results that have been seen by the patient already.   - telephone call: abnormal results or if patient has not viewed results in their echart.   - Please take medications as prescribed. - Follow up with your primary health provider if any health concerns arises. - If symptoms worsen please contact your primary care provider and/or visit the emergency department.

## 2023-08-28 NOTE — Progress Notes (Unsigned)
   Established Patient Office Visit   Subjective  Patient ID: James [name], male    DOB: [birth year]  Age: 62 y.o. MRN: [id number]  No chief complaint on file.   He  has a past medical history of CKD (chronic kidney disease) stage 2, GFR 60-89 ml/min, COPD (chronic obstructive pulmonary disease) (HCC), Diabetes mellitus type II, non insulin dependent (HCC), Dilated aortic root (HCC), Hypercholesteremia, Hypertension, NICM (nonischemic cardiomyopathy) (HCC), NSVT (nonsustained ventricular tachycardia) (HCC), Obstructive sleep apnea, Paroxysmal atrial fibrillation (HCC), Pulmonary hypertension (HCC), PVC's (premature ventricular contractions), and Tobacco abuse.  HPI  ROS    Objective:     There were no vitals taken for this visit. {Vitals History (Optional):23777}  Physical Exam   No results found for any visits on 08/29/23.  The 10-year ASCVD risk score (Arnett DK, et al., 2019) is: 19.8%    Assessment & Plan:  There are no diagnoses linked to this encounter.  No follow-ups on file.   Cruzita Lederer Newman Nip, FNP

## 2023-08-29 ENCOUNTER — Ambulatory Visit: Payer: BC Managed Care – PPO | Admitting: Family Medicine

## 2023-08-29 ENCOUNTER — Encounter: Payer: Self-pay | Admitting: Family Medicine

## 2023-08-29 VITALS — BP 115/83 | HR 74 | Ht 78.0 in | Wt 262.0 lb

## 2023-08-29 DIAGNOSIS — R42 Dizziness and giddiness: Secondary | ICD-10-CM | POA: Diagnosis not present

## 2023-08-29 MED ORDER — MECLIZINE HCL 25 MG PO TABS: 25.0000 mg | ORAL_TABLET | Freq: Three times a day (TID) | ORAL | 0 refills | Status: DC | PRN

## 2023-08-29 NOTE — Assessment & Plan Note (Signed)
Trial on meclizine 25 mg PRN  The patient is currently taking a diuretic, which may be contributing to their dizziness. It is recommended that the patient consult with their cardiologist to discuss possible alternative treatments if this is determined to be the cause     Orthostatic blood pressure measurements and EKG done at  urgent care a few days go results WNL  Labs ordered to rule out deficiency, BMP, CBC, Iron panel, Vit B12, Vit D Referral placed to Audiology Testing to evaluate inner ear function, as many vertigo cases are related to vestibular issues. Discussed , sleep with your head slightly elevated and rise slowly from bed in the morning to prevent sudden dizziness. Limiting salt, caffeine, and alcohol may also reduce fluid buildup in the inner ear, which can lessen vertigo episodes. Avoid positions or activities that trigger symptoms, such as bending over or quickly turning your head. Additionally, focus on slow, steady movements

## 2023-09-03 DIAGNOSIS — G4733 Obstructive sleep apnea (adult) (pediatric): Secondary | ICD-10-CM | POA: Diagnosis not present

## 2023-09-03 LAB — BMP8+EGFR
BUN/Creatinine Ratio: 21 (ref 10–24)
BUN: 31 mg/dL — ABNORMAL HIGH (ref 8–27)
CO2: 20 mmol/L (ref 20–29)
Calcium: 8.4 mg/dL — ABNORMAL LOW (ref 8.6–10.2)
Chloride: 106 mmol/L (ref 96–106)
Creatinine, Ser: 1.5 mg/dL — ABNORMAL HIGH (ref 0.76–1.27)
Glucose: 94 mg/dL (ref 70–99)
Potassium: 4.4 mmol/L (ref 3.5–5.2)
Sodium: 143 mmol/L (ref 134–144)
eGFR: 52 mL/min/{1.73_m2} — ABNORMAL LOW (ref >59)

## 2023-09-03 LAB — CBC WITH DIFFERENTIAL/PLATELET
Basophils Absolute: 0 10*3/uL (ref 0.0–0.2)
Basos: 1 %
EOS (ABSOLUTE): 0.1 10*3/uL (ref 0.0–0.4)
Eos: 1 %
Hematocrit: 54.1 % — ABNORMAL HIGH (ref 37.5–51.0)
Hemoglobin: 18 g/dL — ABNORMAL HIGH (ref 13.0–17.7)
Immature Grans (Abs): 0 10*3/uL (ref 0.0–0.1)
Immature Granulocytes: 0 %
Lymphocytes Absolute: 1.4 10*3/uL (ref 0.7–3.1)
Lymphs: 18 %
MCH: 32.8 pg (ref 26.6–33.0)
MCHC: 33.3 g/dL (ref 31.5–35.7)
MCV: 99 fL — ABNORMAL HIGH (ref 79–97)
Monocytes Absolute: 0.7 10*3/uL (ref 0.1–0.9)
Monocytes: 8 %
Neutrophils Absolute: 5.9 10*3/uL (ref 1.4–7.0)
Neutrophils: 72 %
Platelets: 163 10*3/uL (ref 150–450)
RBC: 5.49 x10E6/uL (ref 4.14–5.80)
RDW: 13.2 % (ref 11.6–15.4)
WBC: 8.1 10*3/uL (ref 3.4–10.8)

## 2023-09-03 LAB — IRON,TIBC AND FERRITIN PANEL
Ferritin: 170 ng/mL (ref 30–400)
Iron Saturation: 38 % (ref 15–55)
Iron: 82 ug/dL (ref 38–169)
Total Iron Binding Capacity: 217 ug/dL — ABNORMAL LOW (ref 250–450)
UIBC: 135 ug/dL (ref 111–343)

## 2023-09-03 LAB — VITAMIN D 25 HYDROXY (VIT D DEFICIENCY, FRACTURES): Vit D, 25-Hydroxy: 16.2 ng/mL — ABNORMAL LOW (ref 30.0–100.0)

## 2023-09-03 LAB — VITAMIN B12: Vitamin B-12: 860 pg/mL (ref 232–1245)

## 2023-09-03 NOTE — Progress Notes (Signed)
Please inform patient,   Vitamin D levels low, I advise to taking  over the counter supplements of vitamin D 1000 IU/day to prevent low vitamin D levels. Consuming Vitamin D rich food sources include fish, salmon, sardines, egg yolks, red meat, liver, oranges, soy milk.     Your eGFR levels indicates reduced kidney function, I advise to keep your hypertension controlled, maintain blood pressure reading goals under 130/80, take your daily blood pressure medications. Managing your diabetes with a hemoglobin A1c goal less than 7. Keep your cholesterol under control to prevent further damage to blood vessels. Avoid NSAIDs medications and take tylenol for pain management.Consume a kidney friendly diet which includes Veggies: cauliflower, onions, eggplant, turnips. Low sodium, low to moderate intake of proteins: lean meats (poultry, fish), eggs, unsalted seafood. Avoid fatty foods, limit or avoid smoking and alcohol intake. Maintain an excercise routine to minimum of 150 minuties a week.

## 2023-09-11 ENCOUNTER — Ambulatory Visit: Payer: BC Managed Care – PPO | Admitting: Internal Medicine

## 2023-09-13 ENCOUNTER — Ambulatory Visit: Payer: BC Managed Care – PPO | Admitting: Internal Medicine

## 2023-09-19 DIAGNOSIS — M79672 Pain in left foot: Secondary | ICD-10-CM | POA: Diagnosis not present

## 2023-09-19 DIAGNOSIS — E1140 Type 2 diabetes mellitus with diabetic neuropathy, unspecified: Secondary | ICD-10-CM | POA: Diagnosis not present

## 2023-09-19 DIAGNOSIS — L565 Disseminated superficial actinic porokeratosis (DSAP): Secondary | ICD-10-CM | POA: Diagnosis not present

## 2023-09-19 DIAGNOSIS — L110 Acquired keratosis follicularis: Secondary | ICD-10-CM | POA: Diagnosis not present

## 2023-09-25 ENCOUNTER — Ambulatory Visit: Payer: Self-pay | Admitting: Internal Medicine

## 2023-09-25 ENCOUNTER — Ambulatory Visit (INDEPENDENT_AMBULATORY_CARE_PROVIDER_SITE_OTHER): Payer: BC Managed Care – PPO | Admitting: Family Medicine

## 2023-09-25 ENCOUNTER — Ambulatory Visit (HOSPITAL_COMMUNITY)
Admission: RE | Admit: 2023-09-25 | Discharge: 2023-09-25 | Disposition: A | Discharge status: Home / Self Care | Payer: BC Managed Care – PPO | Source: Ambulatory Visit | Attending: Family Medicine | Admitting: Family Medicine

## 2023-09-25 ENCOUNTER — Encounter: Payer: Self-pay | Admitting: Family Medicine

## 2023-09-25 ENCOUNTER — Encounter: Payer: Self-pay | Admitting: Internal Medicine

## 2023-09-25 VITALS — BP 127/80 | HR 80 | Ht 78.0 in | Wt 276.1 lb

## 2023-09-25 DIAGNOSIS — R600 Localized edema: Secondary | ICD-10-CM | POA: Insufficient documentation

## 2023-09-25 DIAGNOSIS — Z95810 Presence of automatic (implantable) cardiac defibrillator: Secondary | ICD-10-CM | POA: Diagnosis not present

## 2023-09-25 DIAGNOSIS — R0602 Shortness of breath: Secondary | ICD-10-CM | POA: Diagnosis not present

## 2023-09-25 MED ORDER — TORSEMIDE 60 MG PO TABS: 60.0000 mg | ORAL_TABLET | Freq: Every day | ORAL | 0 refills | Status: DC

## 2023-09-25 NOTE — Assessment & Plan Note (Addendum)
Encouraged the patient to stop by [facility] for a chest x-ray. Therapy will be initiated with torsemide 60 mg daily for one week, and furosemide 20 mg will be held while on torsemide therapy.  The patient was educated on limiting sodium intake and elevating his legs daily for 20-30 minutes above the level of the heart. The use of compression stockings is also advised, along with regular exercise and weight reduction. It is important to avoid prolonged sitting or standing to alleviate symptoms.  I recommend following up with your cardiologist as soon as possible, as he has gained 14 pounds in the last month.  Wt Readings from Last 3 Encounters:  09/25/23 276 lb 1.3 oz (125.2 kg)  08/29/23 262 lb (118.8 kg)  08/11/23 257 lb (116.6 kg)     Encouraged to report any symptoms of wheezing, increased fatigue, decreased urinary output, abdominal swelling, or shortness of breath when lying flat.

## 2023-09-25 NOTE — Progress Notes (Signed)
Acute Office Visit  Subjective:    Patient ID: James [name], male    DOB: 02-28-1961, 62 y.o.   MRN: [id number]  Chief Complaint  Patient presents with   Edema    Pt reports swelling for 3 weeks.     HPI The patient presents today with complaints of bilateral lower extremity edema for the past 3 weeks. He reports shortness of breath with exertion, cough with clear phlegm but denies orthopnea, chest pain, or palpitations. The patient has a history of congestive heart failure and has been compliant with his prescribed diuretics. He denies a high sodium diet and reports adherence to his treatment regimen. Notably, the patient has gained 14 pounds since 08/29/2023.  Past Medical History:  Diagnosis Date   CKD (chronic kidney disease) stage 2, GFR 60-89 ml/min    COPD (chronic obstructive pulmonary disease) (HCC)    Diabetes mellitus type II, non insulin dependent (HCC)    Dilated aortic root (HCC)    Hypercholesteremia    Hypertension    NICM (nonischemic cardiomyopathy) (HCC)    NSVT (nonsustained ventricular tachycardia) (HCC)    Obstructive sleep apnea    was positive but never given a CPAP   Paroxysmal atrial fibrillation (HCC)    Pulmonary hypertension (HCC)    PVC's (premature ventricular contractions)    Tobacco abuse     Past Surgical History:  Procedure Laterality Date   CARDIAC CATHETERIZATION  2002   COLONOSCOPY N/A 03/22/2014   Procedure: COLONOSCOPY;  Surgeon: West Bali, MD;  Location: [facility];  Service: Endoscopy;  Laterality: N/A;  11:15 AM   ICD IMPLANT N/A 08/21/2021   Procedure: ICD IMPLANT;  Surgeon: Marinus Maw, MD;  Location: [facility] INVASIVE CV LAB;  Service: Cardiovascular;  Laterality: N/A;   INGUINAL HERNIA REPAIR Right 06/24/2017   Procedure: HERNIA REPAIR INGUINAL ADULT WITH MESH;  Surgeon: Franky Macho, MD;  Location: [facility] ORS;  Service: General;  Laterality: Right;   RIGHT HEART CATH N/A 11/13/2022   Procedure: RIGHT HEART CATH;  Surgeon:  Dorthula Nettles, DO;  Location: [facility] INVASIVE CV LAB;  Service: Cardiovascular;  Laterality: N/A;   RIGHT HEART CATH N/A 12/21/2022   Procedure: RIGHT HEART CATH;  Surgeon: Dorthula Nettles, DO;  Location: [facility] INVASIVE CV LAB;  Service: Cardiovascular;  Laterality: N/A;   RIGHT/LEFT HEART CATH AND CORONARY ANGIOGRAPHY N/A 07/19/2021   Procedure: RIGHT/LEFT HEART CATH AND CORONARY ANGIOGRAPHY;  Surgeon: Kathleene Hazel, MD;  Location: [facility] INVASIVE CV LAB;  Service: Cardiovascular;  Laterality: N/A;   TEE WITHOUT CARDIOVERSION N/A 12/21/2022   Procedure: TRANSESOPHAGEAL ECHOCARDIOGRAM (TEE);  Surgeon: Dorthula Nettles, DO;  Location: [facility] ENDOSCOPY;  Service: Cardiovascular;  Laterality: N/A;    Family History  Problem Relation Age of Onset   Coronary artery disease Mother    Diabetes Father    Heart attack Father    Hypertension Father    Iron deficiency Father    Thyroid disease Sister    Cardiomyopathy Brother        No significant coronary disease by coronary angiography   Heart disease Maternal Grandfather        Also paternal grandfather   Sudden death Other    Colon cancer Neg Hx     Social History   Socioeconomic History   Marital status: Married    Spouse name: Irineo Derring   Number of children: 4   Years of education: Not on file   Highest education level: Associate degree: occupational,  technical, or vocational program  Occupational History   Occupation: Merchandiser, retail    Comment: [employer]  Tobacco Use   Smoking status: Former    Current packs/day: 0.00    Average packs/day: 0.5 packs/day for 38.8 years (19.4 ttl pk-yrs)    Types: Cigarettes    Start date: 10/15/1982    Quit date: 07/18/2021    Years since quitting: 2.1   Smokeless tobacco: Never   Tobacco comments:    3 cigaretts a day  Vaping Use   Vaping status: Never Used  Substance and Sexual Activity   Alcohol use: No    Alcohol/week: 0.0 standard drinks of alcohol   Drug use: No   Sexual  activity: Yes    Birth control/protection: None  Other Topics Concern   Not on file  Social History Narrative   Divorced   No regular exercise   Social Determinants of Health   Financial Resource Strain: Low Risk  (07/21/2021)   Overall Financial Resource Strain (CARDIA)    Difficulty of Paying Living Expenses: Not very hard  Food Insecurity: No Food Insecurity (07/22/2022)   Hunger Vital Sign    Worried About Running Out of Food in the Last Year: Never true    Ran Out of Food in the Last Year: Never true  Transportation Needs: No Transportation Needs (07/22/2022)   PRAPARE - Administrator, Civil Service (Medical): No    Lack of Transportation (Non-Medical): No  Physical Activity: Not on file  Stress: Not on file  Social Connections: Not on file  Intimate Partner Violence: Not At Risk (07/22/2022)   Humiliation, Afraid, Rape, and Kick questionnaire    Fear of Current or Ex-Partner: No    Emotionally Abused: No    Physically Abused: No    Sexually Abused: No    Outpatient Medications Prior to Visit  Medication Sig Dispense Refill   acetaminophen (TYLENOL) 500 MG tablet Take 1,000 mg by mouth every 6 (six) hours as needed for moderate pain.     albuterol (VENTOLIN HFA) 108 (90 Base) MCG/ACT inhaler INHALE 2 PUFFS BY MOUTH FOUR TIMES DAILY AS NEEDED 6.7 g 1   apixaban (ELIQUIS) 5 MG TABS tablet Take 1 tablet (5 mg total) by mouth 2 (two) times daily. 60 tablet 11   atorvastatin (LIPITOR) 40 MG tablet Take 1 tablet (40 mg total) by mouth daily. 90 tablet 3   carvedilol (COREG) 12.5 MG tablet TAKE 1 TABLET(12.5 MG) BY MOUTH TWICE DAILY 60 tablet 2   dapagliflozin propanediol (FARXIGA) 10 MG TABS tablet Take 1 tablet (10 mg total) by mouth daily before breakfast. 90 tablet 3   fluticasone (FLONASE) 50 MCG/ACT nasal spray Place 2 sprays into both nostrils daily. 16 g 0   furosemide (LASIX) 40 MG tablet Take 0.5 tablets (20 mg total) by mouth every other day.     meclizine  (ANTIVERT) 25 MG tablet Take 1 tablet (25 mg total) by mouth 3 (three) times daily as needed for dizziness. 30 tablet 0   metFORMIN (GLUCOPHAGE) 500 MG tablet TAKE 1 TABLET BY MOUTH TWICE DAILY 180 tablet 1   promethazine-dextromethorphan (PROMETHAZINE-DM) 6.25-15 MG/5ML syrup Take 5 mLs by mouth 4 (four) times daily as needed. 118 mL 0   sacubitril-valsartan (ENTRESTO) 97-103 MG Take 1 tablet by mouth 2 (two) times daily. 60 tablet 11   sildenafil (VIAGRA) 100 MG tablet 1/2 to 1 tablet p.o. as needed 20 tablet prn   spironolactone (ALDACTONE) 25 MG tablet TAKE 1  TABLET(25 MG) BY MOUTH DAILY 90 tablet 3   Facility-Administered Medications Prior to Visit  Medication Dose Route Frequency Provider Last Rate Last Admin   sodium chloride flush (NS) 0.9 % injection 3 mL  3 mL Intravenous Q12H Sabharwal, Aditya, DO        No Known Allergies  Review of Systems  Constitutional:  Negative for fatigue and fever.  Eyes:  Negative for visual disturbance.  Respiratory:  Positive for cough and shortness of breath (With exertion). Negative for chest tightness and wheezing.   Cardiovascular:  Positive for leg swelling. Negative for chest pain and palpitations.  Neurological:  Negative for dizziness and headaches.       Objective:    Physical Exam HENT:     Head: Normocephalic.     Right Ear: External ear normal.     Left Ear: External ear normal.     Nose: No congestion or rhinorrhea.     Mouth/Throat:     Mouth: Mucous membranes are moist.  Cardiovascular:     Rate and Rhythm: Regular rhythm.     Heart sounds: No murmur heard. Pulmonary:     Effort: No respiratory distress.     Breath sounds: No wheezing, rhonchi or rales.  Neurological:     Mental Status: He is alert.     BP 127/80   Pulse 80   Ht 6\' 6"  (1.981 m)   Wt 276 lb 1.3 oz (125.2 kg)   SpO2 96%   BMI 31.90 kg/m  Wt Readings from Last 3 Encounters:  09/25/23 276 lb 1.3 oz (125.2 kg)  08/29/23 262 lb (118.8 kg)  08/11/23  257 lb (116.6 kg)       Assessment & Plan:  Bilateral lower extremity edema Assessment & Plan: Encouraged the patient to stop by [facility] for a chest x-ray. Therapy will be initiated with torsemide 60 mg daily for one week, and furosemide 20 mg will be held while on torsemide therapy.  The patient was educated on limiting sodium intake and elevating his legs daily for 20-30 minutes above the level of the heart. The use of compression stockings is also advised, along with regular exercise and weight reduction. It is important to avoid prolonged sitting or standing to alleviate symptoms.  I recommend following up with your cardiologist as soon as possible, as he has gained 14 pounds in the last month.  Wt Readings from Last 3 Encounters:  09/25/23 276 lb 1.3 oz (125.2 kg)  08/29/23 262 lb (118.8 kg)  08/11/23 257 lb (116.6 kg)     Encouraged to report any symptoms of wheezing, increased fatigue, decreased urinary output, abdominal swelling, or shortness of breath when lying flat.    Orders: -     Torsemide; Take 60 mg by mouth daily for 7 days.  Dispense: 20 tablet; Refill: 0 -     DG Chest 2 View -     CBC with Differential/Platelet -     BMP8+EGFR -     Brain natriuretic peptide -     TSH + free T4  Note: This chart has been completed using Engineer, civil (consulting) software, and while attempts have been made to ensure accuracy, certain words and phrases may not be transcribed as intended.    Gilmore Laroche, FNP

## 2023-09-25 NOTE — Telephone Encounter (Signed)
Copied from CRM [id number]. Topic: Clinical - Red Word Triage >> Sep 25, 2023  9:53 AM Dennison Nancy wrote: Red Word that prompted transfer to Nurse Triage: last 3 weeks retain fluid  in both legs , never had this issue before , patient is taken a fluid pill call furosemide (LASIX) 40 MG tablet   Chief Complaint: BLE Edema Symptoms: Severe lower extremity edema Frequency: 3 Weeks  Pertinent Negatives: Patient denies chest pain, dyspnea, localized calf swelling, redness, or discoloration.  Disposition: [] ED /[] Urgent Care (no appt availability in office) / [x] Appointment(In office/virtual)/ []  [facility]/ [] Home Care/ [] Refused Recommended Disposition /[] [facility]/ []  Follow-up with PCP Additional Notes: James [name] is a 62 year old male who was triaged today regarding BLE Edema for the past 3 weeks. The patient reports the edema to be severe as it starts in both of his thighs and extends downward throughout both extremities. The patient reports 2-3+ pitting edema in the calf area. The patient denies acute, distress like symptoms, and takes Lasix, but is worried due to this being the first time this has occurred for him.    Reason for Disposition  [1] MODERATE leg swelling (e.g., swelling extends up to knees) AND [2] new-onset or worsening  Answer Assessment - Initial Assessment Questions 1. ONSET: "When did the swelling start?" (e.g., minutes, hours, days)     3 Weeks Ago 2. LOCATION: "What part of the leg is swollen?"  "Are both legs swollen or just one leg?"     Both legs, Thigh and downward.  3. SEVERITY: "How bad is the swelling?" (e.g., localized; mild, moderate, severe)   - Localized: Small area of swelling localized to one leg.   - MILD pedal edema: Swelling limited to foot and ankle, pitting edema < 1/4 inch (6 mm) deep, rest and elevation eliminate most or all swelling.   - MODERATE edema: Swelling of lower leg to knee, pitting edema > 1/4 inch (6 mm) deep, rest and  elevation only partially reduce swelling.   - SEVERE edema: Swelling extends above knee, facial or hand swelling present.      Severe 4. REDNESS: "Does the swelling look red or infected?"     No 5. PAIN: "Is the swelling painful to touch?" If Yes, ask: "How painful is it?"   (Scale 1-10; mild, moderate or severe)     No pain.  6. FEVER: "Do you have a fever?" If Yes, ask: "What is it, how was it measured, and when did it start?"      No.  7. CAUSE: "What do you think is causing the leg swelling?"     Unsure.  8. MEDICAL HISTORY: "Do you have a history of blood clots (e.g., DVT), cancer, heart failure, kidney disease, or liver failure?"     Defibrillator, Last October.  9. RECURRENT SYMPTOM: "Have you had leg swelling before?" If Yes, ask: "When was the last time?" "What happened that time?"     First time.  10. OTHER SYMPTOMS: "Do you have any other symptoms?" (e.g., chest pain, difficulty breathing)       No other symptoms.  11. PREGNANCY: "Is there any chance you are pregnant?" "When was your last menstrual period?"       N/A  Protocols used: Leg Swelling and Edema-A-AH

## 2023-09-25 NOTE — Patient Instructions (Addendum)
I appreciate the opportunity to provide care to you today!    Follow up:  1 week with pcp  Labs: please stop by the lab today to get your blood drawn (CBC, BMP, TSH, BNP)  Bilateral Lower Extremity Edema:  Please stop by [facility] to get a chest x-ray. Start taking torsemide 60 mg daily for one week, and hold furosemide 20 mg while on torsemide therapy.  I recommend limiting your sodium intake and elevating your legs daily for 20-30 minutes above the level of your heart. The use of compression stockings is also advised, along with regular exercise and weight reduction. It's important to avoid prolonged sitting or standing to alleviate symptoms.  I recommend following up with your cardiologist as soon as possible, as you have gained 14 pounds in the last month.  Please report any symptoms of wheezing, increased fatigue, decreased urinary output, abdominal swelling, or shortness of breath when lying flat.   Please continue to a heart-healthy diet and increase your physical activities. Try to exercise for at least five days a week.    It was a pleasure to see you and I look forward to continuing to work together on your health and well-being. Please do not hesitate to call the office if you need care or have questions about your care.  In case of emergency, please visit the Emergency Department for urgent care, or contact our clinic at [phone] to schedule an appointment. We're here to help you!   Have a wonderful day and week. With Gratitude, Gilmore Laroche MSN, FNP-BC

## 2023-09-26 LAB — CBC WITH DIFFERENTIAL/PLATELET
Basophils Absolute: 0 10*3/uL (ref 0.0–0.2)
Basos: 0 %
EOS (ABSOLUTE): 0.1 10*3/uL (ref 0.0–0.4)
Eos: 1 %
Hematocrit: 51.7 % — ABNORMAL HIGH (ref 37.5–51.0)
Hemoglobin: 17.6 g/dL (ref 13.0–17.7)
Immature Grans (Abs): 0 10*3/uL (ref 0.0–0.1)
Immature Granulocytes: 0 %
Lymphocytes Absolute: 1.4 10*3/uL (ref 0.7–3.1)
Lymphs: 19 %
MCH: 32.9 pg (ref 26.6–33.0)
MCHC: 34 g/dL (ref 31.5–35.7)
MCV: 97 fL (ref 79–97)
Monocytes Absolute: 0.6 10*3/uL (ref 0.1–0.9)
Monocytes: 8 %
Neutrophils Absolute: 5.2 10*3/uL (ref 1.4–7.0)
Neutrophils: 72 %
Platelets: 140 10*3/uL — ABNORMAL LOW (ref 150–450)
RBC: 5.35 x10E6/uL (ref 4.14–5.80)
RDW: 12.9 % (ref 11.6–15.4)
WBC: 7.4 10*3/uL (ref 3.4–10.8)

## 2023-09-26 LAB — TSH+FREE T4
Free T4: 1.46 ng/dL (ref 0.82–1.77)
TSH: 1.37 u[IU]/mL (ref 0.450–4.500)

## 2023-09-30 ENCOUNTER — Telehealth: Payer: Self-pay

## 2023-09-30 ENCOUNTER — Telehealth: Payer: Self-pay | Admitting: Urology

## 2023-09-30 NOTE — Telephone Encounter (Signed)
Advised patient to call [facility] to follow up

## 2023-09-30 NOTE — Telephone Encounter (Signed)
Patient called [facility] for an appointment. [facility] will not make an appointment. They do not have a referral on patient.

## 2023-09-30 NOTE — Telephone Encounter (Signed)
Hey there's a referral in for 10/08 !

## 2023-09-30 NOTE — Progress Notes (Signed)
Please inform the patient  that the chest X-ray did not show any signs of disease or abnormalities involving the heart (cardio) or lungs (pulmonary). This suggests that the heart and lungs appear normal on the imaging study.       His labs are also stable

## 2023-10-02 ENCOUNTER — Telehealth: Payer: Self-pay | Admitting: Internal Medicine

## 2023-10-02 NOTE — Telephone Encounter (Unsigned)
Copied from CRM [id number]. Topic: General - Other >> Oct 02, 2023  3:08 PM Antony Haste wrote: Reason for CRM: PT states he received missed call from PCP.  CB#: [phone]

## 2023-10-03 ENCOUNTER — Encounter: Payer: Self-pay | Admitting: Internal Medicine

## 2023-10-03 ENCOUNTER — Ambulatory Visit: Payer: BC Managed Care – PPO | Attending: Family Medicine | Admitting: Audiologist

## 2023-10-03 DIAGNOSIS — G4733 Obstructive sleep apnea (adult) (pediatric): Secondary | ICD-10-CM | POA: Diagnosis not present

## 2023-10-03 DIAGNOSIS — H9313 Tinnitus, bilateral: Secondary | ICD-10-CM | POA: Diagnosis not present

## 2023-10-03 DIAGNOSIS — H9042 Sensorineural hearing loss, unilateral, left ear, with unrestricted hearing on the contralateral side: Secondary | ICD-10-CM

## 2023-10-03 DIAGNOSIS — R42 Dizziness and giddiness: Secondary | ICD-10-CM

## 2023-10-03 NOTE — Procedures (Signed)
  Outpatient Audiology and [facility] [address] [place], [place]  [address] [phone]  AUDIOLOGICAL  EVALUATION  NAME: James [name]     DOB:   15-Feb-1961      MRN: [id number]                                                                                     DATE: 10/03/2023     REFERENT: Billie Lade, MD STATUS: Outpatient DIAGNOSIS: Vertigo, Tinnitus, Mild Left Ear Sensorineural Hering Loss   History: James [name] was seen for an audiological evaluation due to vertigo and tinnitus. James [name] is having a hard time walking. He is dizzy every time he stands up and moves. He cannot drive. He is significantly impacted for weeks. This started around a month to six weeks ago. He has a muffled pressured feeling in both ears. He has ringing in both ears and its worse in the right. There was no precipitating cause for vertigo such as a fever or fall, it started randomly. James [name] has history of hazardous noise exposure at work and is tested by [employer] annually. James [name] is not aware of any loss.     Evaluation:  Otoscopy showed a clear view of the tympanic membranes, bilaterally Tympanometry results were consistent with normal middle ear function, bilaterally   Audiometric testing was completed using Conventional Audiometry techniques with insert earphones and supraural headphones. Test results are consistent with normal hearing right ear and mild sensorineural loss at high frequencies in left. Speech Recognition Thresholds were obtained at  15dB HL in the right ear and at 15dB HL in the left ear. Word Recognition Testing was completed at  40dB SL and James [name] scored 100% in each ear.    Results:  The test results were reviewed with James [name]. 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 He needs to see Otolaryngology due to this asymmetric, especially due to the severity of his vertigo. The audiologists at the [facility] E[facility]  office can be a more in depth vestibular evaluation.     Recommendations: Recommend referral to Otolaryngology at [facility] (Dr. Allena Katz). James [name] needs a vestibular work up with their vestibular audiologist due to asymmetric loss, asymmetric tinnitus, and vertigo.      25 minutes spent testing and counseling on results.   If you have any questions please feel free to contact me at [phone].  Ammie Ferrier Au.D.  Audiologist   10/03/2023  10:24 AM  Cc: Billie Lade, MD

## 2023-10-03 NOTE — Telephone Encounter (Signed)
  Third attempt calling to give lab results- vm not set up  PER GLORIA - Please inform the patient  that the chest X-ray did not show any signs of disease or abnormalities involving the heart (cardio) or lungs (pulmonary). This suggests that the heart and lungs appear normal on the imaging study.       His labs are also stable

## 2023-10-09 ENCOUNTER — Emergency Department (HOSPITAL_COMMUNITY): Payer: BC Managed Care – PPO

## 2023-10-09 ENCOUNTER — Encounter (HOSPITAL_COMMUNITY): Payer: Self-pay

## 2023-10-09 ENCOUNTER — Inpatient Hospital Stay (HOSPITAL_COMMUNITY)
Admission: EM | Admit: 2023-10-09 | Discharge: 2023-10-14 | DRG: 291 | Disposition: A | Discharge status: Home / Self Care | Payer: BC Managed Care – PPO | Attending: Internal Medicine | Admitting: Internal Medicine

## 2023-10-09 ENCOUNTER — Other Ambulatory Visit: Payer: Self-pay

## 2023-10-09 DIAGNOSIS — J4520 Mild intermittent asthma, uncomplicated: Secondary | ICD-10-CM

## 2023-10-09 DIAGNOSIS — Z8249 Family history of ischemic heart disease and other diseases of the circulatory system: Secondary | ICD-10-CM | POA: Diagnosis not present

## 2023-10-09 DIAGNOSIS — I272 Pulmonary hypertension, unspecified: Secondary | ICD-10-CM | POA: Diagnosis not present

## 2023-10-09 DIAGNOSIS — J4489 Other specified chronic obstructive pulmonary disease: Secondary | ICD-10-CM | POA: Diagnosis not present

## 2023-10-09 DIAGNOSIS — N1831 Chronic kidney disease, stage 3a: Secondary | ICD-10-CM | POA: Diagnosis present

## 2023-10-09 DIAGNOSIS — I428 Other cardiomyopathies: Secondary | ICD-10-CM | POA: Diagnosis not present

## 2023-10-09 DIAGNOSIS — I480 Paroxysmal atrial fibrillation: Secondary | ICD-10-CM | POA: Diagnosis not present

## 2023-10-09 DIAGNOSIS — Z95810 Presence of automatic (implantable) cardiac defibrillator: Secondary | ICD-10-CM

## 2023-10-09 DIAGNOSIS — N1832 Chronic kidney disease, stage 3b: Secondary | ICD-10-CM | POA: Diagnosis not present

## 2023-10-09 DIAGNOSIS — G4733 Obstructive sleep apnea (adult) (pediatric): Secondary | ICD-10-CM | POA: Diagnosis present

## 2023-10-09 DIAGNOSIS — I4892 Unspecified atrial flutter: Secondary | ICD-10-CM | POA: Diagnosis not present

## 2023-10-09 DIAGNOSIS — E876 Hypokalemia: Secondary | ICD-10-CM | POA: Diagnosis not present

## 2023-10-09 DIAGNOSIS — I5043 Acute on chronic combined systolic (congestive) and diastolic (congestive) heart failure: Secondary | ICD-10-CM | POA: Diagnosis not present

## 2023-10-09 DIAGNOSIS — Z1152 Encounter for screening for COVID-19: Secondary | ICD-10-CM

## 2023-10-09 DIAGNOSIS — J811 Chronic pulmonary edema: Secondary | ICD-10-CM | POA: Diagnosis not present

## 2023-10-09 DIAGNOSIS — Z7984 Long term (current) use of oral hypoglycemic drugs: Secondary | ICD-10-CM | POA: Diagnosis not present

## 2023-10-09 DIAGNOSIS — I517 Cardiomegaly: Secondary | ICD-10-CM | POA: Diagnosis not present

## 2023-10-09 DIAGNOSIS — Z7901 Long term (current) use of anticoagulants: Secondary | ICD-10-CM

## 2023-10-09 DIAGNOSIS — I5021 Acute systolic (congestive) heart failure: Secondary | ICD-10-CM | POA: Diagnosis not present

## 2023-10-09 DIAGNOSIS — I110 Hypertensive heart disease with heart failure: Secondary | ICD-10-CM | POA: Diagnosis not present

## 2023-10-09 DIAGNOSIS — E119 Type 2 diabetes mellitus without complications: Secondary | ICD-10-CM

## 2023-10-09 DIAGNOSIS — R0989 Other specified symptoms and signs involving the circulatory and respiratory systems: Secondary | ICD-10-CM | POA: Diagnosis not present

## 2023-10-09 DIAGNOSIS — E782 Mixed hyperlipidemia: Secondary | ICD-10-CM | POA: Diagnosis present

## 2023-10-09 DIAGNOSIS — Z87891 Personal history of nicotine dependence: Secondary | ICD-10-CM

## 2023-10-09 DIAGNOSIS — I10 Essential (primary) hypertension: Secondary | ICD-10-CM | POA: Diagnosis present

## 2023-10-09 DIAGNOSIS — Z8349 Family history of other endocrine, nutritional and metabolic diseases: Secondary | ICD-10-CM

## 2023-10-09 DIAGNOSIS — I130 Hypertensive heart and chronic kidney disease with heart failure and stage 1 through stage 4 chronic kidney disease, or unspecified chronic kidney disease: Principal | ICD-10-CM | POA: Diagnosis present

## 2023-10-09 DIAGNOSIS — Z79899 Other long term (current) drug therapy: Secondary | ICD-10-CM | POA: Diagnosis not present

## 2023-10-09 DIAGNOSIS — R058 Other specified cough: Secondary | ICD-10-CM | POA: Diagnosis not present

## 2023-10-09 DIAGNOSIS — E1122 Type 2 diabetes mellitus with diabetic chronic kidney disease: Secondary | ICD-10-CM | POA: Diagnosis present

## 2023-10-09 DIAGNOSIS — I5020 Unspecified systolic (congestive) heart failure: Secondary | ICD-10-CM | POA: Diagnosis not present

## 2023-10-09 DIAGNOSIS — J45909 Unspecified asthma, uncomplicated: Secondary | ICD-10-CM | POA: Diagnosis present

## 2023-10-09 DIAGNOSIS — R0602 Shortness of breath: Secondary | ICD-10-CM | POA: Diagnosis present

## 2023-10-09 DIAGNOSIS — Z833 Family history of diabetes mellitus: Secondary | ICD-10-CM

## 2023-10-09 LAB — GLUCOSE, CAPILLARY: Glucose-Capillary: 119 mg/dL — ABNORMAL HIGH (ref 70–99)

## 2023-10-09 LAB — CBC
HCT: 50.9 % (ref 39.0–52.0)
Hemoglobin: 16.9 g/dL (ref 13.0–17.0)
MCH: 32.8 pg (ref 26.0–34.0)
MCHC: 33.2 g/dL (ref 30.0–36.0)
MCV: 98.8 fL (ref 80.0–100.0)
Platelets: 183 10*3/uL (ref 150–400)
RBC: 5.15 MIL/uL (ref 4.22–5.81)
RDW: 13.8 % (ref 11.5–15.5)
WBC: 8.1 10*3/uL (ref 4.0–10.5)
nRBC: 0 % (ref 0.0–0.2)

## 2023-10-09 LAB — BRAIN NATRIURETIC PEPTIDE: B Natriuretic Peptide: 4442 pg/mL — ABNORMAL HIGH (ref 0.0–100.0)

## 2023-10-09 LAB — BASIC METABOLIC PANEL
Anion gap: 8 (ref 5–15)
BUN: 32 mg/dL — ABNORMAL HIGH (ref 8–23)
CO2: 26 mmol/L (ref 22–32)
Calcium: 8.5 mg/dL — ABNORMAL LOW (ref 8.9–10.3)
Chloride: 104 mmol/L (ref 98–111)
Creatinine, Ser: 1.59 mg/dL — ABNORMAL HIGH (ref 0.61–1.24)
GFR, Estimated: 49 mL/min — ABNORMAL LOW (ref >60)
Glucose, Bld: 120 mg/dL — ABNORMAL HIGH (ref 70–99)
Potassium: 4 mmol/L (ref 3.5–5.1)
Sodium: 138 mmol/L (ref 135–145)

## 2023-10-09 LAB — TROPONIN I (HIGH SENSITIVITY)
Troponin I (High Sensitivity): 36 ng/L — ABNORMAL HIGH (ref <18)
Troponin I (High Sensitivity): 41 ng/L — ABNORMAL HIGH (ref <18)

## 2023-10-09 MED ORDER — ALBUTEROL SULFATE (2.5 MG/3ML) 0.083% IN NEBU: 3.0000 mL | INHALATION_SOLUTION | RESPIRATORY_TRACT | Status: DC | PRN

## 2023-10-09 MED ORDER — ONDANSETRON HCL 4 MG PO TABS: 4.0000 mg | ORAL_TABLET | Freq: Four times a day (QID) | ORAL | Status: DC | PRN

## 2023-10-09 MED ORDER — INSULIN ASPART 100 UNIT/ML IJ SOLN
0.0000 [IU] | Freq: Three times a day (TID) | INTRAMUSCULAR | Status: DC
  Administered 2023-10-10: 2 [IU] via SUBCUTANEOUS
  Administered 2023-10-10: 1 [IU] via SUBCUTANEOUS
  Administered 2023-10-11: 2 [IU] via SUBCUTANEOUS
  Administered 2023-10-11 – 2023-10-13 (×4): 1 [IU] via SUBCUTANEOUS
  Administered 2023-10-13: 2 [IU] via SUBCUTANEOUS

## 2023-10-09 MED ORDER — INSULIN ASPART 100 UNIT/ML IJ SOLN: 0.0000 [IU] | Freq: Every day | INTRAMUSCULAR | Status: DC

## 2023-10-09 MED ORDER — ATORVASTATIN CALCIUM 40 MG PO TABS
40.0000 mg | ORAL_TABLET | Freq: Every day | ORAL | Status: DC
Start: 2023-10-10 — End: 2023-10-14
  Administered 2023-10-10 – 2023-10-14 (×5): 40 mg via ORAL
  Filled 2023-10-09 (×5): qty 1

## 2023-10-09 MED ORDER — APIXABAN 5 MG PO TABS
5.0000 mg | ORAL_TABLET | Freq: Two times a day (BID) | ORAL | Status: DC
  Administered 2023-10-09 – 2023-10-14 (×10): 5 mg via ORAL
  Filled 2023-10-09 (×10): qty 1

## 2023-10-09 MED ORDER — CARVEDILOL 12.5 MG PO TABS
12.5000 mg | ORAL_TABLET | Freq: Two times a day (BID) | ORAL | Status: DC
  Administered 2023-10-10: 12.5 mg via ORAL
  Filled 2023-10-09: qty 1

## 2023-10-09 MED ORDER — POLYETHYLENE GLYCOL 3350 17 G PO PACK: 17.0000 g | PACK | Freq: Every day | ORAL | Status: DC | PRN

## 2023-10-09 MED ORDER — ACETAMINOPHEN 650 MG RE SUPP: 650.0000 mg | Freq: Four times a day (QID) | RECTAL | Status: DC | PRN

## 2023-10-09 MED ORDER — ACETAMINOPHEN 325 MG PO TABS: 650.0000 mg | ORAL_TABLET | Freq: Four times a day (QID) | ORAL | Status: DC | PRN

## 2023-10-09 MED ORDER — SACUBITRIL-VALSARTAN 97-103 MG PO TABS
1.0000 | ORAL_TABLET | Freq: Two times a day (BID) | ORAL | Status: DC
  Administered 2023-10-09 – 2023-10-14 (×10): 1 via ORAL
  Filled 2023-10-09 (×14): qty 1

## 2023-10-09 MED ORDER — ONDANSETRON HCL 4 MG/2ML IJ SOLN: 4.0000 mg | Freq: Four times a day (QID) | INTRAMUSCULAR | Status: DC | PRN

## 2023-10-09 MED ORDER — SPIRONOLACTONE 25 MG PO TABS
25.0000 mg | ORAL_TABLET | Freq: Every day | ORAL | Status: DC
  Administered 2023-10-10 – 2023-10-14 (×5): 25 mg via ORAL
  Filled 2023-10-09 (×5): qty 1

## 2023-10-09 MED ORDER — MECLIZINE HCL 12.5 MG PO TABS: 25.0000 mg | ORAL_TABLET | Freq: Three times a day (TID) | ORAL | Status: DC | PRN

## 2023-10-09 MED ORDER — FUROSEMIDE 10 MG/ML IJ SOLN
40.0000 mg | Freq: Once | INTRAMUSCULAR | Status: AC
  Administered 2023-10-09: 40 mg via INTRAVENOUS
  Filled 2023-10-09: qty 4

## 2023-10-09 MED ORDER — FUROSEMIDE 10 MG/ML IJ SOLN
40.0000 mg | Freq: Two times a day (BID) | INTRAMUSCULAR | Status: DC
  Administered 2023-10-10 – 2023-10-13 (×8): 40 mg via INTRAVENOUS
  Filled 2023-10-09 (×8): qty 4

## 2023-10-09 NOTE — ED Notes (Signed)
phleb getting bloodwork

## 2023-10-09 NOTE — ED Notes (Signed)
ED TO INPATIENT HANDOFF REPORT  ED Nurse Name and Phone #:   S Name/Age/Gender James [name] 62 y.o. male Room/Bed: APA08/APA08  Code Status   Code Status: Prior  Home/SNF/Other Home Patient oriented to: self, place, time, and situation Is this baseline? Yes   Triage Complete: Triage complete  Chief Complaint Acute on chronic combined systolic (congestive) and diastolic (congestive) heart failure (HCC) [I50.43]  Triage Note Pov from home. Cc of sob and leg swelling.  Hx of copd, CHF.  Had the same issue a couple weeks ago and was told to increase his furosemide from 40 to 60mg . But the pharmacy did not have it.    Allergies No Known Allergies  Level of Care/Admitting Diagnosis ED Disposition     ED Disposition  Admit   Condition  --   Comment  Hospital Area: [facility] [[id number]]  Level of Care: Telemetry [5]  Covid Evaluation: Asymptomatic - no recent exposure (last 10 days) testing not required  Diagnosis: Acute on chronic combined systolic (congestive) and diastolic (congestive) heart failure [facility]) [[id number]]  Admitting Physician: Cresenciano Lick  Attending Physician: Onnie Boer [email or web address]  Certification:: I certify this patient will need inpatient services for at least 2 midnights  Expected Medical Readiness: 10/11/2023          B Medical/Surgery History Past Medical History:  Diagnosis Date   CKD (chronic kidney disease) stage 2, GFR 60-89 ml/min    COPD (chronic obstructive pulmonary disease) (HCC)    Diabetes mellitus type II, non insulin dependent (HCC)    Dilated aortic root (HCC)    Hypercholesteremia    Hypertension    NICM (nonischemic cardiomyopathy) (HCC)    NSVT (nonsustained ventricular tachycardia) (HCC)    Obstructive sleep apnea    was positive but never given a CPAP   Paroxysmal atrial fibrillation (HCC)    Pulmonary hypertension (HCC)    PVC's (premature ventricular contractions)    Tobacco abuse     Past Surgical History:  Procedure Laterality Date   CARDIAC CATHETERIZATION  2002   COLONOSCOPY N/A 03/22/2014   Procedure: COLONOSCOPY;  Surgeon: West Bali, MD;  [facility];  Service: Endoscopy;  Laterality: N/A;  11:15 AM   ICD IMPLANT N/A 08/21/2021   Procedure: ICD IMPLANT;  Surgeon: Marinus Maw, MD;  Location: [facility] INVASIVE CV LAB;  Service: Cardiovascular;  Laterality: N/A;   INGUINAL HERNIA REPAIR Right 06/24/2017   Procedure: HERNIA REPAIR INGUINAL ADULT WITH MESH;  Surgeon: Franky Macho, MD;  Location: [facility] ORS;  Service: General;  Laterality: Right;   RIGHT HEART CATH N/A 11/13/2022   Procedure: RIGHT HEART CATH;  Surgeon: Dorthula Nettles, DO;  Location: [facility] INVASIVE CV LAB;  Service: Cardiovascular;  Laterality: N/A;   RIGHT HEART CATH N/A 12/21/2022   Procedure: RIGHT HEART CATH;  Surgeon: Dorthula Nettles, DO;  Location: [facility] INVASIVE CV LAB;  Service: Cardiovascular;  Laterality: N/A;   RIGHT/LEFT HEART CATH AND CORONARY ANGIOGRAPHY N/A 07/19/2021   Procedure: RIGHT/LEFT HEART CATH AND CORONARY ANGIOGRAPHY;  Surgeon: Kathleene Hazel, MD;  Location: [facility] INVASIVE CV LAB;  Service: Cardiovascular;  Laterality: N/A;   TEE WITHOUT CARDIOVERSION N/A 12/21/2022   Procedure: TRANSESOPHAGEAL ECHOCARDIOGRAM (TEE);  Surgeon: Dorthula Nettles, DO;  Location: [facility] ENDOS[facility];  Service: Cardiovascular;  Laterality: N/A;     A IV Location/Drains/Wounds Patient Lines/Drains/Airways Status     Active Line/Drains/Airways     Name Placement date Placement time Site Days   Peripheral IV  10/09/23 20 G 1" Posterior;Right Forearm 10/09/23  1849  Forearm  less than 1            Intake/Output Last 24 hours No intake or output data in the 24 hours ending 10/09/23 2035  Labs/Imaging Results for orders placed or performed during the hospital encounter of 10/09/23 (from the past 48 hours)  Basic metabolic panel     Status: Abnormal   Collection Time: 10/09/23  5:13 PM   Result Value Ref Range   Sodium 138 135 - 145 mmol/L   Potassium 4.0 3.5 - 5.1 mmol/L   Chloride 104 98 - 111 mmol/L   CO2 26 22 - 32 mmol/L   Glucose, Bld 120 (H) 70 - 99 mg/dL    Comment: Glucose reference range applies only to samples taken after fasting for at least 8 hours.   BUN 32 (H) 8 - 23 mg/dL   Creatinine, Ser 5.40 (H) 0.61 - 1.24 mg/dL   Calcium 8.5 (L) 8.9 - 10.3 mg/dL   GFR, Estimated 49 (L) >60 mL/min    Comment: (NOTE) Calculated using the CKD-EPI Creatinine Equation (2021)    Anion gap 8 5 - 15    Comment: Performed at [facility], [address] [place], [place] [address]  CBC     Status: None   Collection Time: 10/09/23  5:13 PM  Result Value Ref Range   WBC 8.1 4.0 - 10.5 K/uL   RBC 5.15 4.22 - 5.81 MIL/uL   Hemoglobin 16.9 13.0 - 17.0 g/dL   HCT 14.7 82.9 - 56.2 %   MCV 98.8 80.0 - 100.0 fL   MCH 32.8 26.0 - 34.0 pg   MCHC 33.2 30.0 - 36.0 g/dL   RDW 13.0 86.5 - 78.4 %   Platelets 183 150 - 400 K/uL   nRBC 0.0 0.0 - 0.2 %    Comment: Performed at [facility], [address] [place], [place] [address]  Brain natriuretic peptide (order ONLY if patient c/o SOB)     Status: Abnormal   Collection Time: 10/09/23  5:13 PM  Result Value Ref Range   B Natriuretic Peptide 4,442.0 (H) 0.0 - 100.0 pg/mL    Comment: Performed at [facility], [address] [place], [place] [address]   DG Chest 2 View Result Date: 10/09/2023 CLINICAL DATA:  Productive cough and shortness of for 1 week. Lower extremity edema EXAM: CHEST - 2 VIEW COMPARISON:  09/25/2023 FINDINGS: Stable mild cardiomegaly and chronic pulmonary vascular congestion. No No evidence of acute infiltrate or edema. No evidence of pleural effusion. Permanent single lead pacemaker remains in place IMPRESSION: Stable mild cardiomegaly and chronic pulmonary vascular congestion. No active disease. Electronically Signed   By: Danae Orleans M.D.   On: 10/09/2023 17:20    Pending Labs Unresulted Labs (From  admission, onward)    None       Vitals/Pain Today's Vitals   10/09/23 1730 10/09/23 1856 10/09/23 1921 10/09/23 1922  BP: 111/81  118/86   Pulse: 62  60 61  Resp: 20   18  Temp:      TempSrc:      SpO2: 94%  97% 95%  Weight:  121.3 kg    Height:      PainSc:        Isolation Precautions No active isolations  Medications Medications  furosemide (LASIX) injection 40 mg (40 mg Intravenous Given 10/09/23 2009)    Mobility walks     Focused Assessments  R Recommendations: See Admitting Provider Note  Report given to:   Additional Notes:

## 2023-10-09 NOTE — Assessment & Plan Note (Signed)
Stable.  Resume home regimen 

## 2023-10-09 NOTE — H&P (Signed)
History and Physical    [name]:[id number] DOB: 1961-07-30 DOA: 10/09/2023  PCP: Billie Lade, MD   Patient coming from: Home  I have personally briefly reviewed patient's old medical records in [facility]  Chief Complaint: leg swelling, Shortness of breath  HPI: James [name] is a 62 y.o. male with medical history significant for systolic and diastolic CHF, asthma, COPD, atrial fibrillation, hypertension, pulmonary pretension, CKD 3, diabetes mellitus. Patient presented to the ED with complaints of difficulty breathing on exertion, bilateral lower extremity swelling started about a month ago.  Reports swelling has progressed from his legs up to his thighs and abdomen.  Reports about 14 pound weight gain, reports his baseline weight is about 240 pounds.  No chest pain.  Reports a mild cough today.  Reports compliance with Lasix 40 mg daily, and torsemide- ??dose.  Reports about a month ago his dose of Lasix was increased to 60 mg, after these complaints but he was unable to get this dose filled, so he continued with his 40 mg dose.  Also reports compliance with Eliquis.  ED Course: Temp 98.1, heart rate- 60 - 64, respirate rate 17-22.  Blood pressure systolic 112-128.  O2 sats greater than 94% on room air. BNP markedly elevated at 4442. Chest x-ray with chronic pulmonary vascular congestion. IV 40 mg furosemide given.  Review of Systems: As per HPI all other systems reviewed and negative.  Past Medical History:  Diagnosis Date   CKD (chronic kidney disease) stage 2, GFR 60-89 ml/min    COPD (chronic obstructive pulmonary disease) (HCC)    Diabetes mellitus type II, non insulin dependent (HCC)    Dilated aortic root (HCC)    Hypercholesteremia    Hypertension    NICM (nonischemic cardiomyopathy) (HCC)    NSVT (nonsustained ventricular tachycardia) (HCC)    Obstructive sleep apnea    was positive but never given a CPAP   Paroxysmal atrial fibrillation  (HCC)    Pulmonary hypertension (HCC)    PVC's (premature ventricular contractions)    Tobacco abuse     Past Surgical History:  Procedure Laterality Date   CARDIAC CATHETERIZATION  2002   COLONOSCOPY N/A 03/22/2014   Procedure: COLONOSCOPY;  Surgeon: West Bali, MD;  [facility];  Service: Endoscopy;  Laterality: N/A;  11:15 AM   ICD IMPLANT N/A 08/21/2021   Procedure: ICD IMPLANT;  Surgeon: Marinus Maw, MD;  Location: [facility] INVASIVE CV LAB;  Service: Cardiovascular;  Laterality: N/A;   INGUINAL HERNIA REPAIR Right 06/24/2017   Procedure: HERNIA REPAIR INGUINAL ADULT WITH MESH;  Surgeon: Franky Macho, MD;  Location: [facility];  Service: General;  Laterality: Right;   RIGHT HEART CATH N/A 11/13/2022   Procedure: RIGHT HEART CATH;  Surgeon: Dorthula Nettles, DO;  [facility] INV[facility] CV LAB;  Service: Cardiovascular;  Laterality: N/A;   RIGHT HEART CATH N/A 12/21/2022   Procedure: RIGHT HEART CATH;  Surgeon: Dorthula Nettles, DO;  [facility]: [facility] INVAS[facility] CV LAB;  Service: Cardiovascular;  Laterality: N/A;   RIGHT/LEFT HEART CATH AND CORONARY ANGIOGRAPHY N/A 07/19/2021   Procedure: RIGHT/LEFT HEART CATH AND CORONARY ANGIOGRAPHY;  Surgeon: Kathleene Hazel, MD;  [facility] CV LAB;  Service: Cardiovascular;  Laterality: N/A;   TEE WITHOUT CARDIOVERSION N/A 12/21/2022   Procedure: TRANSESOPHAGEAL ECHOCARDIOGRAM (TEE);  Surgeon: Dorthula Nettles, DO;  [facility];  Service: Cardiovascular;  Laterality: N/A;     reports that he quit smoking about 2 years ago.  His smoking use included cigarettes. He started smoking about 41 years ago. He has a 19.4 pack-year smoking history. He has never used smokeless tobacco. He reports that he does not drink alcohol and does not use drugs.  No Known Allergies  Family History  Problem Relation Age of Onset   Coronary artery disease Mother    Diabetes Father    Heart attack Father    Hypertension Father    Iron  deficiency Father    Thyroid disease Sister    Cardiomyopathy Brother        No significant coronary disease by coronary angiography   Heart disease Maternal Grandfather        Also paternal grandfather   Sudden death Other    Colon cancer Neg Hx     Prior to Admission medications   Medication Sig Start Date End Date Taking? Authorizing Provider  acetaminophen (TYLENOL) 500 MG tablet Take 1,000 mg by mouth every 6 (six) hours as needed for moderate pain.    [provider]  albuterol (VENTOLIN HFA) 108 (90 Base) MCG/ACT inhaler INHALE 2 PUFFS BY MOUTH FOUR TIMES DAILY AS NEEDED 08/21/23   Billie Lade, MD  apixaban (ELIQUIS) 5 MG TABS tablet Take 1 tablet (5 mg total) by mouth 2 (two) times daily. 11/27/22   Marinus Maw, MD  atorvastatin (LIPITOR) 40 MG tablet Take 1 tablet (40 mg total) by mouth daily. 11/23/22 03/21/24  Sabharwal, Aditya, DO  carvedilol (COREG) 12.5 MG tablet TAKE 1 TABLET(12.5 MG) BY MOUTH TWICE DAILY 05/04/22   Marinus Maw, MD  dapagliflozin propanediol (FARXIGA) 10 MG TABS tablet Take 1 tablet (10 mg total) by mouth daily before breakfast. 03/22/23   Sabharwal, Aditya, DO  fluticasone (FLONASE) 50 MCG/ACT nasal spray Place 2 sprays into both nostrils daily. 07/08/23   Leath-Warren, Sadie Haber, NP  furosemide (LASIX) 40 MG tablet Take 0.5 tablets (20 mg total) by mouth every other day. 05/15/23   Sabharwal, Aditya, DO  meclizine (ANTIVERT) 25 MG tablet Take 1 tablet (25 mg total) by mouth 3 (three) times daily as needed for dizziness. 08/29/23   Del Newman Nip, Tenna Child, FNP  metFORMIN (GLUCOPHAGE) 500 MG tablet TAKE 1 TABLET BY MOUTH TWICE DAILY 08/21/23   Billie Lade, MD  promethazine-dextromethorphan (PROMETHAZINE-DM) 6.25-15 MG/5ML syrup Take 5 mLs by mouth 4 (four) times daily as needed. 07/08/23   Leath-Warren, Sadie Haber, NP  sacubitril-valsartan (ENTRESTO) 97-103 MG Take 1 tablet by mouth 2 (two) times daily. 10/22/22   Sabharwal, Eliezer Lofts, DO  sildenafil  (VIAGRA) 100 MG tablet 1/2 to 1 tablet p.o. as needed 05/27/23   Marcine Matar, MD  spironolactone (ALDACTONE) 25 MG tablet TAKE 1 TABLET(25 MG) BY MOUTH DAILY 04/03/23   Marinus Maw, MD  Torsemide 60 MG TABS Take 60 mg by mouth daily for 7 days. 09/25/23 10/02/23  Gilmore Laroche, FNP    Physical Exam: Vitals:   10/09/23 1730 10/09/23 1856 10/09/23 1921 10/09/23 1922  BP: 111/81  118/86   Pulse: 62  60 61  Resp: 20   18  Temp:      TempSrc:      SpO2: 94%  97% 95%  Weight:  121.3 kg    Height:        Constitutional: NAD, calm, comfortable Vitals:   10/09/23 1730 10/09/23 1856 10/09/23 1921 10/09/23 1922  BP: 111/81  118/86   Pulse: 62  60 61  Resp: 20   18  Temp:  TempSrc:      SpO2: 94%  97% 95%  Weight:  121.3 kg    Height:       Eyes: PERRL, lids and conjunctivae normal ENMT: Mucous membranes are moist.  Neck: normal, supple, no masses, no thyromegaly Respiratory: clear to auscultation bilaterally, no wheezing, no crackles. Normal respiratory effort. No accessory muscle use.  Cardiovascular: Regular rate and rhythm, no murmurs / rubs / gallops.  1+ pitting bilateral lower extremity edema worse on the right Extremities warm. Abdomen: no tenderness, no masses palpated. No hepatosplenomegaly.   Musculoskeletal: no clubbing / cyanosis. No joint deformity upper and lower extremities.  Skin: no rashes, lesions, ulcers. No induration Neurologic: No facial assymmetry, moving extremities spontaneously.Marland Kitchen  Psychiatric: Normal judgment and insight. Alert and oriented x 3. Normal mood.   Labs on Admission: I have personally reviewed following labs and imaging studies  CBC: Recent Labs  Lab 10/09/23 1713  WBC 8.1  HGB 16.9  HCT 50.9  MCV 98.8  PLT 183   Basic Metabolic Panel: Recent Labs  Lab 10/09/23 1713  NA 138  K 4.0  CL 104  CO2 26  GLUCOSE 120*  BUN 32*  CREATININE 1.59*  CALCIUM 8.5*   Urine analysis:    Component Value Date/Time    COLORURINE YELLOW 08/11/2023 1250   APPEARANCEUR CLEAR 08/11/2023 1250   APPEARANCEUR Clear 05/27/2023 1406   LABSPEC 1.021 08/11/2023 1250   PHURINE 5.0 08/11/2023 1250   GLUCOSEU >=500 (A) 08/11/2023 1250   HGBUR SMALL (A) 08/11/2023 1250   BILIRUBINUR NEGATIVE 08/11/2023 1250   BILIRUBINUR Negative 05/27/2023 1406   KETONESUR NEGATIVE 08/11/2023 1250   PROTEINUR 100 (A) 08/11/2023 1250   NITRITE NEGATIVE 08/11/2023 1250   LEUKOCYTESUR NEGATIVE 08/11/2023 1250    Radiological Exams on Admission: DG Chest 2 View Result Date: 10/09/2023 CLINICAL DATA:  Productive cough and shortness of for 1 week. Lower extremity edema EXAM: CHEST - 2 VIEW COMPARISON:  09/25/2023 FINDINGS: Stable mild cardiomegaly and chronic pulmonary vascular congestion. No No evidence of acute infiltrate or edema. No evidence of pleural effusion. Permanent single lead pacemaker remains in place IMPRESSION: Stable mild cardiomegaly and chronic pulmonary vascular congestion. No active disease. Electronically Signed   By: Danae Orleans M.D.   On: 10/09/2023 17:20   EKG: Independently reviewed.  Sinus rhythm, rate 70, QTc 451.  PVCs.  Assessment/Plan Principal Problem:   Acute on chronic combined systolic and diastolic CHF (congestive heart failure) (HCC) Active Problems:   Essential hypertension   Cardiomyopathy, nonischemic (HCC)   Obstructive sleep apnea   Diabetes mellitus type 2 in nonobese (HCC)   Asthma   Stage 3a chronic kidney disease (HCC)   Paroxysmal atrial fibrillation (HCC)  Assessment and Plan: * Acute on chronic combined systolic and diastolic CHF (congestive heart failure) (HCC) Dyspnea on exertion, 1+ pitting bilateral lower extremity edema with abdominal bloating.  Reports 14 pound weight gain.  Marked BNP elevation at 4,442.  Two-view chest x-ray chronic pulmonary vascular congestion without active disease.  Last echo TEE 12/2022 EF of 20 to 25%.  Reports compliance with Eliquis, Lasix 40 mg  daily, ?? torsemide dose -Trend troponin -IV Lasix 40 twice daily -Strict input output, daily weights, daily BMP -Resume Entresto, spironolactone, carvedilol -Farxiga held while inpatient  Paroxysmal atrial fibrillation (HCC) Rate controlled, currently in sinus rhythm. -Resume carvedilol and Eliquis  Stage 3a chronic kidney disease (HCC) CKD IIIa.  Creatinine stable at 1.59.  Asthma Stable -Resume home regimen  Diabetes mellitus  type 2 in nonobese (HCC) - SSI- S - HgbA1c -Hold Farxiga, metformin  Essential hypertension Stable. -Resume Entresto, carvedilol, spironolactone   DVT prophylaxis: Eliquis Code Status: Full Family Communication: Spouse at bedside Disposition Plan: > 2 days Consults called: None Admission status: Inpt Tele  I certify that at the point of admission it is my clinical judgment that the patient will require inpatient hospital care spanning beyond 2 midnights from the point of admission due to high intensity of service, high risk for further deterioration and high frequency of surveillance required.    Author: Onnie Boer, MD 10/09/2023 9:19 PM  For on call review [email or web address].

## 2023-10-09 NOTE — Assessment & Plan Note (Signed)
Stable. -Resume Entresto, carvedilol, spironolactone

## 2023-10-09 NOTE — Assessment & Plan Note (Addendum)
CKD IIIa.  Creatinine stable at 1.59.

## 2023-10-09 NOTE — ED Provider Notes (Signed)
[place] EMERGENCY DEPARTMENT AT [facility] Provider Note   CSN: [id number] Arrival date & time: 10/09/23  1650     History  Chief Complaint  Patient presents with   Shortness of Breath    James [name] is a 62 y.o. male.  HPI     62 y.o. male with nonischemic cardiomyopathy s/p ICD EF of 20 to 25%, HTNm, T2DM, aortic root aneurysm and what appears to be pulmonary hypertension per previous cath presents to the emergency room with chief complaint of worsening leg swelling and shortness of breath.  Patient indicates that over the last month he has had swelling in his legs.  Over the last few days, the swelling is proceeded to go towards his abdomen.  He feels that his abdomen is distended.  Additionally he has been noticing that he gets shortness of breath with exertion, like walking from parking lot to home.   He thinks that he has worsening of his heart failure.  He had this conversation with his PCP, there was some discussion about putting him on a different diuretic regimen -but pharmacy did not have the medicine.  He states that his dry weight is typically 254 pounds, but at his last assessment he was 14 pounds over weight  Home Medications Prior to Admission medications   Medication Sig Start Date End Date Taking? Authorizing Provider  acetaminophen (TYLENOL) 500 MG tablet Take 1,000 mg by mouth every 6 (six) hours as needed for moderate pain.    [provider]  albuterol (VENTOLIN HFA) 108 (90 Base) MCG/ACT inhaler INHALE 2 PUFFS BY MOUTH FOUR TIMES DAILY AS NEEDED 08/21/23   Billie Lade, MD  apixaban (ELIQUIS) 5 MG TABS tablet Take 1 tablet (5 mg total) by mouth 2 (two) times daily. 11/27/22   Marinus Maw, MD  atorvastatin (LIPITOR) 40 MG tablet Take 1 tablet (40 mg total) by mouth daily. 11/23/22 03/21/24  Sabharwal, Aditya, DO  carvedilol (COREG) 12.5 MG tablet TAKE 1 TABLET(12.5 MG) BY MOUTH TWICE DAILY 05/04/22   Marinus Maw, MD   dapagliflozin propanediol (FARXIGA) 10 MG TABS tablet Take 1 tablet (10 mg total) by mouth daily before breakfast. 03/22/23   Sabharwal, Aditya, DO  fluticasone (FLONASE) 50 MCG/ACT nasal spray Place 2 sprays into both nostrils daily. 07/08/23   Leath-Warren, Sadie Haber, NP  furosemide (LASIX) 40 MG tablet Take 0.5 tablets (20 mg total) by mouth every other day. 05/15/23   Sabharwal, Aditya, DO  meclizine (ANTIVERT) 25 MG tablet Take 1 tablet (25 mg total) by mouth 3 (three) times daily as needed for dizziness. 08/29/23   Del Newman Nip, Tenna Child, FNP  metFORMIN (GLUCOPHAGE) 500 MG tablet TAKE 1 TABLET BY MOUTH TWICE DAILY 08/21/23   Billie Lade, MD  promethazine-dextromethorphan (PROMETHAZINE-DM) 6.25-15 MG/5ML syrup Take 5 mLs by mouth 4 (four) times daily as needed. 07/08/23   Leath-Warren, Sadie Haber, NP  sacubitril-valsartan (ENTRESTO) 97-103 MG Take 1 tablet by mouth 2 (two) times daily. 10/22/22   Sabharwal, Eliezer Lofts, DO  sildenafil (VIAGRA) 100 MG tablet 1/2 to 1 tablet p.o. as needed 05/27/23   Marcine Matar, MD  spironolactone (ALDACTONE) 25 MG tablet TAKE 1 TABLET(25 MG) BY MOUTH DAILY 04/03/23   Marinus Maw, MD  Torsemide 60 MG TABS Take 60 mg by mouth daily for 7 days. 09/25/23 10/02/23  Gilmore Laroche, FNP      Allergies    Patient has no known allergies.    Review of Systems  Review of Systems  All other systems reviewed and are negative.   Physical Exam Updated Vital Signs BP 118/86   Pulse 61   Temp 98.1 F (36.7 C) (Oral)   Resp 18   Ht 6\' 6"  (1.981 m)   Wt 121.3 kg   SpO2 95%   BMI 30.91 kg/m  Physical Exam Vitals and nursing note reviewed.  Constitutional:      Appearance: He is well-developed.  HENT:     Head: Atraumatic.  Cardiovascular:     Rate and Rhythm: Normal rate.  Pulmonary:     Effort: Pulmonary effort is normal.     Breath sounds: No decreased breath sounds, wheezing, rhonchi or rales.  Musculoskeletal:     Cervical back: Neck supple.      Right lower leg: Edema present.     Left lower leg: Edema present.  Skin:    General: Skin is warm.  Neurological:     Mental Status: He is alert and oriented to person, place, and time.     ED Results / Procedures / Treatments   Labs (all labs ordered are listed, but only abnormal results are displayed) Labs Reviewed  BASIC METABOLIC PANEL - Abnormal; Notable for the following components:      Result Value   Glucose, Bld 120 (*)    BUN 32 (*)    Creatinine, Ser 1.59 (*)    Calcium 8.5 (*)    GFR, Estimated 49 (*)    All other components within normal limits  BRAIN NATRIURETIC PEPTIDE - Abnormal; Notable for the following components:   B Natriuretic Peptide 4,442.0 (*)    All other components within normal limits  CBC    EKG EKG Interpretation Date/Time:  Wednesday October 09 2023 16:58:43 EST Ventricular Rate:  70 PR Interval:  174 QRS Duration:  102 QT Interval:  418 QTC Calculation: 451 R Axis:   -74  Text Interpretation: Sinus rhythm with sinus arrhythmia with occasional Premature ventricular complexes Possible Left atrial enlargement Left axis deviation Septal infarct (cited on or before 26-Aug-2023) Lateral infarct , age undetermined Inferior infarct (cited on or before 26-Aug-2023) Abnormal ECG When compared with ECG of 26-Aug-2023 13:12, Premature ventricular complexes are now Present Serial changes of Septal infarct Present Confirmed by Derwood Kaplan ([id number]) on 10/09/2023 5:40:37 PM  Radiology DG Chest 2 View Result Date: 10/09/2023 CLINICAL DATA:  Productive cough and shortness of for 1 week. Lower extremity edema EXAM: CHEST - 2 VIEW COMPARISON:  09/25/2023 FINDINGS: Stable mild cardiomegaly and chronic pulmonary vascular congestion. No No evidence of acute infiltrate or edema. No evidence of pleural effusion. Permanent single lead pacemaker remains in place IMPRESSION: Stable mild cardiomegaly and chronic pulmonary vascular congestion. No active disease.  Electronically Signed   By: Danae Orleans M.D.   On: 10/09/2023 17:20    Procedures Procedures    Medications Ordered in ED Medications  furosemide (LASIX) injection 40 mg (40 mg Intravenous Given 10/09/23 2009)    ED Course/ Medical Decision Making/ A&P                                 Medical Decision Making Amount and/or Complexity of Data Reviewed Labs: ordered. Radiology: ordered.  This patient presents to the ED with chief complaint(s) of increasing leg swelling, increased weight gain, shortness of breath with pertinent past medical history of nonischemic cardiomyopathy with EF of 20 to 25%, pulmonary hypertension and  aortic valve dilatation.The complaint involves an extensive differential diagnosis and also carries with it a high risk of complications and morbidity.    The differential diagnosis includes : Acute CHF, acute volume overload, pulmonary hypertension exacerbation, severe anemia, new renal failure.  The initial plan is to get basic labs.  Will also get x-ray of the chest.  Lung exam is overall reassuring, but patient was having orthopnea like symptoms.  He also reports exertional shortness of breath which is new for him.  Additionally, there is weight gain and worsening lower extremity pitting edema that is noted on exam.   Additional history obtained: Additional history obtained from family Records reviewed  reviewed previous echocardiogram, cardiac cath, recent PCP note and also cardiology/advanced heart failure team note.  Independent labs interpretation:  The following labs were independently interpreted: BNP is over 4000.  Patient does not have elevation of creatinine, thereby unlikely to be in cardiorenal disease.  Independent visualization and interpretation of imaging: - I independently visualized the following imaging with scope of interpretation limited to determining acute life threatening conditions related to emergency care: X-ray of the chest, which  revealed scattered opacity that could be indicative of pulmonary edema.  Per radiology, patient has pulmonary venous congestion.  Consultation: - Consulted or discussed management/test interpretation with external professional: Spoke with cardiology fellow on-call.  Discussed patient's presenting symptoms, overall stability, but decompensation over the last few weeks. Per Dr. Granville Lewis -patient can still be admitted to [facility] given that he is not in cardiogenic shock for diuresing.   Final Clinical Impression(s) / ED Diagnoses Final diagnoses:  Acute systolic congestive heart failure [facility])    Rx / DC Orders ED Discharge Orders     None         Derwood Kaplan, MD 10/09/23 2014

## 2023-10-09 NOTE — ED Triage Notes (Addendum)
Pov from home. Cc of sob and leg swelling.  Hx of copd, CHF.  Had the same issue a couple weeks ago and was told to increase his furosemide from 40 to 60mg . But the pharmacy did not have it.

## 2023-10-09 NOTE — Assessment & Plan Note (Signed)
Rate controlled, currently in sinus rhythm. -Resume carvedilol and Eliquis

## 2023-10-09 NOTE — ED Notes (Signed)
94% after ambulating from lobby to triage.

## 2023-10-09 NOTE — Assessment & Plan Note (Addendum)
Dyspnea on exertion, 1+ pitting bilateral lower extremity edema with abdominal bloating.  Reports 14 pound weight gain.  Marked BNP elevation at 4,442.  Two-view chest x-ray chronic pulmonary vascular congestion without active disease.  Last echo TEE 12/2022 EF of 20 to 25%.  Reports compliance with Eliquis, Lasix 40 mg daily, ?? torsemide dose -Trend troponin -IV Lasix 40 twice daily -Strict input output, daily weights, daily BMP -Resume Entresto, spironolactone, carvedilol -Farxiga held while inpatient

## 2023-10-09 NOTE — Assessment & Plan Note (Signed)
-   SSI- S - HgbA1c -Hold Farxiga, metformin

## 2023-10-10 ENCOUNTER — Other Ambulatory Visit (HOSPITAL_COMMUNITY): Payer: Self-pay | Admitting: *Deleted

## 2023-10-10 ENCOUNTER — Other Ambulatory Visit (HOSPITAL_COMMUNITY): Payer: BC Managed Care – PPO

## 2023-10-10 ENCOUNTER — Encounter (HOSPITAL_COMMUNITY): Payer: Self-pay | Admitting: Internal Medicine

## 2023-10-10 DIAGNOSIS — I5020 Unspecified systolic (congestive) heart failure: Secondary | ICD-10-CM | POA: Diagnosis not present

## 2023-10-10 DIAGNOSIS — I5043 Acute on chronic combined systolic (congestive) and diastolic (congestive) heart failure: Secondary | ICD-10-CM | POA: Diagnosis not present

## 2023-10-10 LAB — BASIC METABOLIC PANEL
Anion gap: 8 (ref 5–15)
BUN: 29 mg/dL — ABNORMAL HIGH (ref 8–23)
CO2: 28 mmol/L (ref 22–32)
Calcium: 8.1 mg/dL — ABNORMAL LOW (ref 8.9–10.3)
Chloride: 103 mmol/L (ref 98–111)
Creatinine, Ser: 1.64 mg/dL — ABNORMAL HIGH (ref 0.61–1.24)
GFR, Estimated: 47 mL/min — ABNORMAL LOW (ref >60)
Glucose, Bld: 135 mg/dL — ABNORMAL HIGH (ref 70–99)
Potassium: 3.3 mmol/L — ABNORMAL LOW (ref 3.5–5.1)
Sodium: 139 mmol/L (ref 135–145)

## 2023-10-10 LAB — GLUCOSE, CAPILLARY
Glucose-Capillary: 110 mg/dL — ABNORMAL HIGH (ref 70–99)
Glucose-Capillary: 140 mg/dL — ABNORMAL HIGH (ref 70–99)
Glucose-Capillary: 152 mg/dL — ABNORMAL HIGH (ref 70–99)
Glucose-Capillary: 115 mg/dL — ABNORMAL HIGH (ref 70–99)

## 2023-10-10 MED ORDER — POTASSIUM CHLORIDE CRYS ER 20 MEQ PO TBCR
40.0000 meq | EXTENDED_RELEASE_TABLET | Freq: Once | ORAL | Status: AC
  Administered 2023-10-10: 40 meq via ORAL
  Filled 2023-10-10: qty 2

## 2023-10-10 MED ORDER — MELATONIN 3 MG PO TABS
6.0000 mg | ORAL_TABLET | Freq: Every day | ORAL | Status: DC
  Administered 2023-10-10 – 2023-10-13 (×4): 6 mg via ORAL
  Filled 2023-10-10 (×4): qty 2

## 2023-10-10 MED ORDER — CARVEDILOL 3.125 MG PO TABS
6.2500 mg | ORAL_TABLET | Freq: Two times a day (BID) | ORAL | Status: DC
  Administered 2023-10-10 – 2023-10-14 (×8): 6.25 mg via ORAL
  Filled 2023-10-10 (×8): qty 2

## 2023-10-10 NOTE — Plan of Care (Signed)
Problem: Education: Goal: Knowledge of General Education information will improve Description: Including pain rating scale, medication(s)/side effects and non-pharmacologic comfort measures 10/10/2023 2008 by Delton Prairie, RN Outcome: Progressing 10/10/2023 0701 by Delton Prairie, RN Outcome: Progressing   Problem: Health Behavior/Discharge Planning: Goal: Ability to manage health-related needs will improve 10/10/2023 2008 by Delton Prairie, RN Outcome: Progressing 10/10/2023 0701 by Delton Prairie, RN Outcome: Progressing   Problem: Clinical Measurements: Goal: Ability to maintain clinical measurements within normal limits will improve 10/10/2023 2008 by Delton Prairie, RN Outcome: Progressing 10/10/2023 0701 by Delton Prairie, RN Outcome: Progressing Goal: Will remain free from infection 10/10/2023 2008 by Delton Prairie, RN Outcome: Progressing 10/10/2023 0701 by Delton Prairie, RN Outcome: Progressing Goal: Diagnostic test results will improve 10/10/2023 2008 by Delton Prairie, RN Outcome: Progressing 10/10/2023 0701 by Delton Prairie, RN Outcome: Progressing Goal: Respiratory complications will improve 10/10/2023 2008 by Delton Prairie, RN Outcome: Progressing 10/10/2023 0701 by Delton Prairie, RN Outcome: Progressing Goal: Cardiovascular complication will be avoided 10/10/2023 2008 by Delton Prairie, RN Outcome: Progressing 10/10/2023 0701 by Delton Prairie, RN Outcome: Progressing   Problem: Activity: Goal: Risk for activity intolerance will decrease 10/10/2023 2008 by Delton Prairie, RN Outcome: Progressing 10/10/2023 0701 by Delton Prairie, RN Outcome: Progressing   Problem: Nutrition: Goal: Adequate nutrition will be maintained 10/10/2023 2008 by Delton Prairie, RN Outcome: Progressing 10/10/2023 0701 by Delton Prairie, RN Outcome: Progressing   Problem: Coping: Goal: Level  of anxiety will decrease 10/10/2023 2008 by Delton Prairie, RN Outcome: Progressing 10/10/2023 0701 by Delton Prairie, RN Outcome: Progressing   Problem: Elimination: Goal: Will not experience complications related to bowel motility 10/10/2023 2008 by Delton Prairie, RN Outcome: Progressing 10/10/2023 0701 by Delton Prairie, RN Outcome: Progressing Goal: Will not experience complications related to urinary retention 10/10/2023 2008 by Delton Prairie, RN Outcome: Progressing 10/10/2023 0701 by Delton Prairie, RN Outcome: Progressing   Problem: Pain Management: Goal: General experience of comfort will improve 10/10/2023 2008 by Delton Prairie, RN Outcome: Progressing 10/10/2023 0701 by Delton Prairie, RN Outcome: Progressing   Problem: Safety: Goal: Ability to remain free from injury will improve 10/10/2023 2008 by Delton Prairie, RN Outcome: Progressing 10/10/2023 0701 by Delton Prairie, RN Outcome: Progressing   Problem: Skin Integrity: Goal: Risk for impaired skin integrity will decrease 10/10/2023 2008 by Delton Prairie, RN Outcome: Progressing 10/10/2023 0701 by Delton Prairie, RN Outcome: Progressing   Problem: Education: Goal: Ability to demonstrate management of disease process will improve 10/10/2023 2008 by Delton Prairie, RN Outcome: Progressing 10/10/2023 0701 by Delton Prairie, RN Outcome: Progressing Goal: Ability to verbalize understanding of medication therapies will improve 10/10/2023 2008 by Delton Prairie, RN Outcome: Progressing 10/10/2023 0701 by Delton Prairie, RN Outcome: Progressing Goal: Individualized Educational Video(s) 10/10/2023 2008 by Delton Prairie, RN Outcome: Progressing 10/10/2023 0701 by Delton Prairie, RN Outcome: Progressing   Problem: Activity: Goal: Capacity to carry out activities will improve 10/10/2023 2008 by Delton Prairie, RN Outcome:  Progressing 10/10/2023 0701 by Delton Prairie, RN Outcome: Progressing   Problem: Cardiac: Goal: Ability to achieve and maintain adequate cardiopulmonary perfusion will improve 10/10/2023 2008 by Delton Prairie, RN Outcome: Progressing 10/10/2023 0701 by Delton Prairie, RN Outcome: Progressing   Problem: Education: Goal: Ability to describe self-care measures that may prevent or decrease complications (Diabetes Survival  Skills Education) will improve 10/10/2023 2008 by Delton Prairie, RN Outcome: Progressing 10/10/2023 0701 by Delton Prairie, RN Outcome: Progressing Goal: Individualized Educational Video(s) 10/10/2023 2008 by Delton Prairie, RN Outcome: Progressing 10/10/2023 0701 by Delton Prairie, RN Outcome: Progressing   Problem: Coping: Goal: Ability to adjust to condition or change in health will improve 10/10/2023 2008 by Delton Prairie, RN Outcome: Progressing 10/10/2023 0701 by Delton Prairie, RN Outcome: Progressing   Problem: Fluid Volume: Goal: Ability to maintain a balanced intake and output will improve 10/10/2023 2008 by Delton Prairie, RN Outcome: Progressing 10/10/2023 0701 by Delton Prairie, RN Outcome: Progressing   Problem: Health Behavior/Discharge Planning: Goal: Ability to identify and utilize available resources and services will improve 10/10/2023 2008 by Delton Prairie, RN Outcome: Progressing 10/10/2023 0701 by Delton Prairie, RN Outcome: Progressing Goal: Ability to manage health-related needs will improve 10/10/2023 2008 by Delton Prairie, RN Outcome: Progressing 10/10/2023 0701 by Delton Prairie, RN Outcome: Progressing   Problem: Metabolic: Goal: Ability to maintain appropriate glucose levels will improve 10/10/2023 2008 by Delton Prairie, RN Outcome: Progressing 10/10/2023 0701 by Delton Prairie, RN Outcome: Progressing   Problem: Nutritional: Goal: Maintenance  of adequate nutrition will improve 10/10/2023 2008 by Delton Prairie, RN Outcome: Progressing 10/10/2023 0701 by Delton Prairie, RN Outcome: Progressing Goal: Progress toward achieving an optimal weight will improve 10/10/2023 2008 by Delton Prairie, RN Outcome: Progressing 10/10/2023 0701 by Delton Prairie, RN Outcome: Progressing   Problem: Skin Integrity: Goal: Risk for impaired skin integrity will decrease 10/10/2023 2008 by Delton Prairie, RN Outcome: Progressing 10/10/2023 0701 by Delton Prairie, RN Outcome: Progressing   Problem: Tissue Perfusion: Goal: Adequacy of tissue perfusion will improve 10/10/2023 2008 by Delton Prairie, RN Outcome: Progressing 10/10/2023 0701 by Delton Prairie, RN Outcome: Progressing

## 2023-10-10 NOTE — Consult Note (Signed)
Cardiology Consultation:   Patient ID: James [name]; [id number]; November 25, 1960   Admit date: 10/09/2023 Date of Consult: 10/10/2023  Primary Care Provider: Billie Lade, MD Primary Cardiologist: Charlton Haws, MD  History of Present Illness:   James [name] is a 62 y.o. male with past medical history outlined below, currently admitted with acute on chronic HFrEF and fluid retention.  I reviewed his records.  He follows in the advanced heart failure clinic, last saw Dr. Gasper Lloyd in July.  He has a nonischemic cardiomyopathy, LVEF 20 to 25% by TEE in March, right heart catheterization and CPX previously consistent with stage D heart failure, advanced therapies have been discussed although managed medically as of last visit due to symptom stability.  He states that he has been having trouble with intermittent fluid retention, typically doubles his Lasix when this happens.  Weight has trended up at home over period of several weeks, possibly 10 to 15 pounds.  Most recent episode of fluid retention happened fairly suddenly within 24 hours, no change in medical therapy, no reported change in sodium intake or fluid intake.  He had contacted his PCP a few weeks ago with recommendation to take limited course of Demadex as to Lasix, although he was not able to get this filled at the pharmacy.  He has a Biotronik ICD in place with follow-up by Dr. Ladona Ridgel.  Last device check that I can see from August showed NSVT, no sustained arrhythmias or device therapies.  He also has paroxysmal atrial fibrillation/flutter, no reported palpitations, not certain about rhythm frequency.  Chest x-ray shows cardiomegaly with pulmonary vascular congestion, no infiltrates or effusions.  BNP 4442, high-sensitivity troponin I levels do not suggest ACS.  ROS:  Pertinent review in history of present illness.  No sudden palpitations or syncope.  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-89 ml/min    COPD (chronic obstructive pulmonary disease) (HCC)    Diabetes mellitus type II, non insulin dependent (HCC)    Dilated aortic root (HCC)    Hypercholesteremia    Hypertension    NICM (nonischemic cardiomyopathy) (HCC)    NSVT (nonsustained ventricular tachycardia) (HCC)    Obstructive sleep apnea    Paroxysmal atrial fibrillation (HCC)    Pulmonary hypertension (HCC)    PVC's (premature ventricular contractions)    Tobacco abuse     Past Surgical History:  Procedure Laterality Date   CARDIAC CATHETERIZATION  2002   COLONOSCOPY N/A 03/22/2014   Procedure: COLONOSCOPY;  Surgeon: West Bali, MD;  Location: [facility];  Service: Endoscopy;  Laterality: N/A;  11:15 AM   ICD IMPLANT N/A 08/21/2021   Procedure: ICD IMPLANT;  Surgeon: Marinus Maw, MD;  Location: [facility] INVASIVE CV LAB;  Service: Cardiovascular;  Laterality: N/A;   INGUINAL HERNIA REPAIR Right 06/24/2017   Procedure: HERNIA REPAIR INGUINAL ADULT WITH MESH;  Surgeon: Franky Macho, MD;  Location: [facility] ORS;  Service: General;  Laterality: Right;   RIGHT HEART CATH N/A 11/13/2022   Procedure: RIGHT HEART CATH;  Surgeon: Dorthula Nettles, DO;  Location: [facility] INVASIVE CV LAB;  Service: Cardiovascular;  Laterality: N/A;   RIGHT HEART CATH N/A 12/21/2022   Procedure: RIGHT HEART CATH;  Surgeon: Dorthula Nettles, DO;  Location: [facility] INVASIVE CV LAB;  Service: Cardiovascular;  Laterality: N/A;   RIGHT/LEFT HEART CATH AND CORONARY ANGIOGRAPHY N/A 07/19/2021   Procedure: RIGHT/LEFT HEART CATH AND CORONARY ANGIOGRAPHY;  Surgeon: Kathleene Hazel, MD;  Location: [facility] INVASIVE CV LAB;  Service: Cardiovascular;  Laterality: N/A;   TEE WITHOUT CARDIOVERSION N/A 12/21/2022   Procedure: TRANSESOPHAGEAL ECHOCARDIOGRAM (TEE);  Surgeon: Dorthula Nettles, DO;  Location: [facility] ENDOSCOPY;  Service: Cardiovascular;  Laterality: N/A;     Inpatient Medications: Scheduled Meds:  apixaban  5 mg Oral BID   atorvastatin  40 mg Oral  Daily   carvedilol  12.5 mg Oral BID WC   furosemide  40 mg Intravenous BID   insulin aspart  0-5 Units Subcutaneous QHS   insulin aspart  0-9 Units Subcutaneous TID WC   sacubitril-valsartan  1 tablet Oral BID   spironolactone  25 mg Oral Daily    PRN Meds: acetaminophen **OR** acetaminophen, albuterol, meclizine, ondansetron **OR** ondansetron (ZOFRAN) IV, polyethylene glycol  Allergies:   No Known Allergies  Social History:   Social History   Tobacco Use   Smoking status: Former    Current packs/day: 0.00    Average packs/day: 0.5 packs/day for 38.8 years (19.4 ttl pk-yrs)    Types: Cigarettes    Start date: 10/15/1982    Quit date: 07/18/2021    Years since quitting: 2.2   Smokeless tobacco: Never   Tobacco comments:    3 cigaretts a day  Substance Use Topics   Alcohol use: No    Alcohol/week: 0.0 standard drinks of alcohol    Family History:   The patient's family history includes Cardiomyopathy in his brother; Coronary artery disease in his mother; Diabetes in his father; Heart attack in his father; Heart disease in his maternal grandfather; Hypertension in his father; Iron deficiency in his father; Sudden death in an other family member; Thyroid disease in his sister. There is no history of Colon cancer.  Physical Exam/Data:   Vitals:   10/09/23 2158 10/10/23 0102 10/10/23 0547 10/10/23 0805  BP:  106/69 117/87 114/88  Pulse:  (!) 56 61 65  Resp:  20 20   Temp:  98.6 F (37 C) 97.6 F (36.4 C)   TempSrc:  Oral Oral   SpO2: 100% 97% 97% 99%  Weight:   118.3 kg   Height:        Intake/Output Summary (Last 24 hours) at 10/10/2023 1000 Last data filed at 10/10/2023 0900 Gross per 24 hour  Intake 1300 ml  Output 4000 ml  Net -2700 ml   Filed Weights   10/09/23 1856 10/09/23 2118 10/10/23 0547  Weight: 121.3 kg 119.7 kg 118.3 kg   Body mass index is 30.14 kg/m.   Gen: Patient appears comfortable at rest. HEENT: Conjunctiva and lids normal. Neck:  Supple, no elevated JVP or carotid bruits. Lungs: Decreased breath sounds at the bases, no crackles, nonlabored breathing at rest. Cardiac: Indistinct PMI, RRR, no gallop. Abdomen: Bowel sounds present, no guarding or rebound. Extremities: 2+ leg edema. Skin: Warm and dry. Musculoskeletal: No kyphosis. Neuropsychiatric: Alert and oriented x3, affect grossly appropriate.  EKG:  An ECG dated 10/09/2023 was personally reviewed today and demonstrated:  Sinus rhythm with borderline increased voltage, decreased R wave progression, left anterior fascicular block, PVC.  Telemetry:  I personally reviewed telemetry which shows sinus rhythm with occasional PVCs, burst of NSVT.  Relevant CV Studies:  Right heart catheterization 12/21/2022: IMPRESSION: Moderately elevated pre and post capillary filling pressures.  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  Severely reduced cardiac output / cardiac index by Fick & Thermodilution (TD likely more reflective of true output).   TEE 12/22/2022:  1.  Left ventricular ejection fraction, by estimation, is 20 to 25%. The  left ventricle has severely decreased function. The left ventricular  internal cavity size was mildly to moderately dilated.   2. Right ventricular systolic function is mildly reduced. The right  ventricular size is normal.   3. Left atrial size was moderately dilated. No left atrial/left atrial  appendage thrombus was detected.   4. The mitral valve is normal in structure. Trivial mitral valve  regurgitation.   5. The aortic valve is normal in structure. Aortic valve regurgitation is  not visualized.   6. The aortic root appears aneurysmal at the level of the sinuses of  Valsalva (near the RCC) measuring up to 4.7cm. There is no aortic  regurgitation.   Laboratory Data:  Chemistry Recent Labs  Lab 10/09/23 1713 10/10/23 0454  NA 138 139  K 4.0 3.3*  CL 104 103  CO2 26 28   GLUCOSE 120* 135*  BUN 32* 29*  CREATININE 1.59* 1.64*  CALCIUM 8.5* 8.1*  GFRNONAA 49* 47*  ANIONGAP 8 8     Hematology Recent Labs  Lab 10/09/23 1713  WBC 8.1  RBC 5.15  HGB 16.9  HCT 50.9  MCV 98.8  MCH 32.8  MCHC 33.2  RDW 13.8  PLT 183   Cardiac Enzymes Recent Labs  Lab 10/09/23 1713 10/09/23 2140  TROPONINIHS 36* 41*   BNP Recent Labs  Lab 10/09/23 1713  BNP 4,442.0*     Lipid Panel     Component Value Date/Time   CHOL 141 03/21/2023 0911   TRIG 136 03/21/2023 0911   HDL 43 03/21/2023 0911   CHOLHDL 3.3 03/21/2023 0911   CHOLHDL 4.8 11/23/2022 1004   VLDL 17 11/23/2022 1004   LDLCALC 74 03/21/2023 0911   LABVLDL 24 03/21/2023 0911    Radiology/Studies:  DG Chest 2 View Result Date: 10/09/2023 CLINICAL DATA:  Productive cough and shortness of for 1 week. Lower extremity edema EXAM: CHEST - 2 VIEW COMPARISON:  09/25/2023 FINDINGS: Stable mild cardiomegaly and chronic pulmonary vascular congestion. No No evidence of acute infiltrate or edema. No evidence of pleural effusion. Permanent single lead pacemaker remains in place IMPRESSION: Stable mild cardiomegaly and chronic pulmonary vascular congestion. No active disease. Electronically Signed   By: Danae Orleans M.D.   On: 10/09/2023 17:20    Assessment and Plan:   1.  Acute on chronic HFrEF with fluid retention.  Patient has history of nonischemic cardiomyopathy, LVEF 20 to 25% as of March.  Right heart catheterization and CPX around that time consistent with stage D heart failure and advanced therapies have been discussed although not yet pursued in light of prior symptom stability.  He is hemodynamically stable at this time and responding to IV Lasix, reports compliance with his medications and no obvious dietary indiscretions.  Approximately 2700 cc out more than so far.  2.  CKD stage IIIb, creatinine 1.64 and GFR 47.  3.  Paroxysmal atrial fibrillation/flutter. His CHA2DS2-VASc score is 3.  He is  on Eliquis for stroke prophylaxis.  4.  Mixed hyperlipidemia on Lipitor.  5.  OSA, not on CPAP.  6.  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  Asymptomatic.  Continue IV diuresis, having good clinical response on current 40 mg BID dose.  Continue Coreg but reduce dose to 6.25 mg twice daily.  Continue Entresto and Aldactone along with Eliquis and Lipitor.  Resume Farxiga at discharge.  Would update echocardiogram.  Check ReDS vest measurement  for baseline.  Would plan to stabilize clinically in terms of fluid status and get him back in to see Dr. Gasper Lloyd in the heart failure clinic as he likely needs to continue with workup for advanced therapies.  For questions or updates, please contact [facility] Please consult [email or web address] for contact info under   Signed, Nona Dell, MD  10/10/2023 10:00 AM

## 2023-10-10 NOTE — TOC CM/SW Note (Signed)
Transition of Care [facility]) - Inpatient Brief Assessment   Patient Details  Name: James [name] MRN: [id number] Date of Birth: March 21, 1961  Transition of Care [facility]) CM/SW Contact:    Beather Arbour Phone Number: 10/10/2023, 10:38 AM   Clinical Narrative: Pt is risk for readmission. Pt was admitted for Acute on chronic combined systolic and diastolic CHF (congestive heart failure) . Pt lives with spouse, still drives, and is independent with ADL's and taking self to appointments. Pt stated that once he is medically ready his spouse will pick him up. TOC signing off.     Transition of Care Asessment: Insurance and Status: Insurance coverage has been reviewed Patient has primary care physician: Yes Home environment has been reviewed: Single Family Home Prior level of function:: Indepedent Prior/Current Home Services: No current home services Social Drivers of Health Review: SDOH reviewed no interventions necessary Readmission risk has been reviewed: Yes Transition of care needs: no transition of care needs at this time

## 2023-10-10 NOTE — Progress Notes (Signed)
[facility] PROGRESS NOTE  Emraan Swor (DOB: 12/06/1960) BMW:[id number] PCP: Billie Lade, MD Outpatient Specialists: Cardiology, Dr. Gasper Lloyd  Brief Narrative: James [name] is a 62 y.o. male with a history of NICM, HFrEF (LVEF 20-25%) s/p ICD for primary prevention, pulmonary HTN, PAF, COPD/asthma, stage IIIa CKD, T2DM, HTN who presented to the ED on 10/09/2023 with several weeks of increasing leg swelling now extending to abdomen associated with exertional dyspnea. He was edematous with elevated BNP (to 4,442) and CXR with cardiomegaly, chronic pulmonary vascular congestion. IV lasix given and the patient was admitted for acute HFrEF.   Subjective: Still swollen in legs but abdominal swelling already starting to respond. Feels +orthopnea and white clear sputum production. No chest pain.   Objective: BP 114/88 (BP Location: Left Arm)   Pulse 65   Temp 97.6 F (36.4 C) (Oral)   Resp 20   Ht 6\' 6"  (1.981 m)   Wt 118.3 kg   SpO2 99%   BMI 30.14 kg/m   Gen: No distress, pleasant Pulm: Clear, nonlabored  CV: RRR, no MRG. Pitting edema involving legs symmetrically and trace edema extending on lower abdominal wall.  GI: Soft, NT, ND, +BS Neuro: Alert and oriented. No new focal deficits. Ext: Warm, no deformities. Skin: No rashes, lesions or ulcers on visualized skin   Assessment & Plan: Acute on chronic HFrEF, HTN: Pt reports 14 lbs weight gain despite taking diuretic and does not mention dietary indiscretions. Symptoms subacute for several weeks.  - Repeat echocardiogram.  - Consulted Dr. Diona Browner, appreciate cardiology assistance - Weights as outpatient have ranged 118-125kg in past few months, currently 118kg, though clinically appears overloaded primarily peripherally (stable chronic pulmonary vascular congestion on my interpretation of CXR).  - Plan to continue lasix 40mg  IV BID based on good UOP response thus far (~4L since admission).  - Continue coreg,  entresto, spironolactone. - Holding farxiga, though may be able to restart.   Hypokalemia:  - Supplement, especially in light of ongoing loop diuretic use.   PAF: Remains in NSR. Recent TSH and free T4 were wnl.  - Continue coreg, eliquis  Stage IIIa CKD:  - Monitor in AM, avoid nephrotoxins.   T2DM: Well-controlled as evidenced by recent HbA1c 6.3%.  - Continue SSI   COPD/asthma: Quiescent.   Tyrone Nine, MD [facility] [email or web address] 10/10/2023, 9:01 AM

## 2023-10-10 NOTE — Plan of Care (Signed)
  Problem: Education: Goal: Knowledge of General Education information will improve Description: Including pain rating scale, medication(s)/side effects and non-pharmacologic comfort measures Outcome: Progressing   Problem: Health Behavior/Discharge Planning: Goal: Ability to manage health-related needs will improve Outcome: Progressing   Problem: Clinical Measurements: Goal: Ability to maintain clinical measurements within normal limits will improve Outcome: Progressing Goal: Will remain free from infection Outcome: Progressing Goal: Diagnostic test results will improve Outcome: Progressing Goal: Respiratory complications will improve Outcome: Progressing Goal: Cardiovascular complication will be avoided Outcome: Progressing   Problem: Activity: Goal: Risk for activity intolerance will decrease Outcome: Progressing   Problem: Nutrition: Goal: Adequate nutrition will be maintained Outcome: Progressing   Problem: Coping: Goal: Level of anxiety will decrease Outcome: Progressing   Problem: Elimination: Goal: Will not experience complications related to bowel motility Outcome: Progressing Goal: Will not experience complications related to urinary retention Outcome: Progressing   Problem: Pain Management: Goal: General experience of comfort will improve Outcome: Progressing   Problem: Safety: Goal: Ability to remain free from injury will improve Outcome: Progressing   Problem: Skin Integrity: Goal: Risk for impaired skin integrity will decrease Outcome: Progressing   Problem: Education: Goal: Ability to demonstrate management of disease process will improve Outcome: Progressing Goal: Ability to verbalize understanding of medication therapies will improve Outcome: Progressing Goal: Individualized Educational Video(s) Outcome: Progressing   Problem: Activity: Goal: Capacity to carry out activities will improve Outcome: Progressing   Problem: Cardiac: Goal:  Ability to achieve and maintain adequate cardiopulmonary perfusion will improve Outcome: Progressing   Problem: Education: Goal: Ability to describe self-care measures that may prevent or decrease complications (Diabetes Survival Skills Education) will improve Outcome: Progressing Goal: Individualized Educational Video(s) Outcome: Progressing   Problem: Coping: Goal: Ability to adjust to condition or change in health will improve Outcome: Progressing   Problem: Fluid Volume: Goal: Ability to maintain a balanced intake and output will improve Outcome: Progressing   Problem: Health Behavior/Discharge Planning: Goal: Ability to identify and utilize available resources and services will improve Outcome: Progressing Goal: Ability to manage health-related needs will improve Outcome: Progressing   Problem: Metabolic: Goal: Ability to maintain appropriate glucose levels will improve Outcome: Progressing   Problem: Nutritional: Goal: Maintenance of adequate nutrition will improve Outcome: Progressing Goal: Progress toward achieving an optimal weight will improve Outcome: Progressing   Problem: Skin Integrity: Goal: Risk for impaired skin integrity will decrease Outcome: Progressing   Problem: Tissue Perfusion: Goal: Adequacy of tissue perfusion will improve Outcome: Progressing

## 2023-10-11 ENCOUNTER — Inpatient Hospital Stay (HOSPITAL_COMMUNITY): Payer: BC Managed Care – PPO

## 2023-10-11 DIAGNOSIS — I10 Essential (primary) hypertension: Secondary | ICD-10-CM | POA: Diagnosis not present

## 2023-10-11 DIAGNOSIS — I5043 Acute on chronic combined systolic (congestive) and diastolic (congestive) heart failure: Secondary | ICD-10-CM

## 2023-10-11 DIAGNOSIS — E119 Type 2 diabetes mellitus without complications: Secondary | ICD-10-CM | POA: Diagnosis not present

## 2023-10-11 DIAGNOSIS — N1831 Chronic kidney disease, stage 3a: Secondary | ICD-10-CM | POA: Diagnosis not present

## 2023-10-11 LAB — BASIC METABOLIC PANEL
Anion gap: 9 (ref 5–15)
BUN: 27 mg/dL — ABNORMAL HIGH (ref 8–23)
CO2: 27 mmol/L (ref 22–32)
Calcium: 8.1 mg/dL — ABNORMAL LOW (ref 8.9–10.3)
Chloride: 101 mmol/L (ref 98–111)
Creatinine, Ser: 1.62 mg/dL — ABNORMAL HIGH (ref 0.61–1.24)
GFR, Estimated: 48 mL/min — ABNORMAL LOW (ref >60)
Glucose, Bld: 102 mg/dL — ABNORMAL HIGH (ref 70–99)
Potassium: 3.5 mmol/L (ref 3.5–5.1)
Sodium: 137 mmol/L (ref 135–145)

## 2023-10-11 LAB — GLUCOSE, CAPILLARY
Glucose-Capillary: 100 mg/dL — ABNORMAL HIGH (ref 70–99)
Glucose-Capillary: 171 mg/dL — ABNORMAL HIGH (ref 70–99)
Glucose-Capillary: 123 mg/dL — ABNORMAL HIGH (ref 70–99)
Glucose-Capillary: 132 mg/dL — ABNORMAL HIGH (ref 70–99)

## 2023-10-11 LAB — ECHOCARDIOGRAM COMPLETE
Area-P 1/2: 6.71 cm2
Calc EF: 19.1 %
Est EF: 20
Height: 78 in
S' Lateral: 5.8 cm
Single Plane A2C EF: 15.5 %
Single Plane A4C EF: 24.2 %
Weight: 4084.68 [oz_av]

## 2023-10-11 LAB — HEMOGLOBIN A1C
Hgb A1c MFr Bld: 7.2 % — ABNORMAL HIGH (ref 4.8–5.6)
Mean Plasma Glucose: 160 mg/dL

## 2023-10-11 MED ORDER — IPRATROPIUM-ALBUTEROL 0.5-2.5 (3) MG/3ML IN SOLN
3.0000 mL | Freq: Three times a day (TID) | RESPIRATORY_TRACT | Status: DC
  Administered 2023-10-11 – 2023-10-14 (×8): 3 mL via RESPIRATORY_TRACT
  Filled 2023-10-11 (×7): qty 3

## 2023-10-11 MED ORDER — IPRATROPIUM-ALBUTEROL 0.5-2.5 (3) MG/3ML IN SOLN
3.0000 mL | Freq: Three times a day (TID) | RESPIRATORY_TRACT | Status: DC
  Administered 2023-10-11: 3 mL via RESPIRATORY_TRACT
  Filled 2023-10-11: qty 3

## 2023-10-11 MED ORDER — PERFLUTREN LIPID MICROSPHERE
1.0000 mL | INTRAVENOUS | Status: AC | PRN
  Administered 2023-10-11: 1 mL via INTRAVENOUS

## 2023-10-11 MED ORDER — BUDESONIDE 0.5 MG/2ML IN SUSP
0.5000 mg | Freq: Two times a day (BID) | RESPIRATORY_TRACT | Status: DC
  Administered 2023-10-11 – 2023-10-14 (×6): 0.5 mg via RESPIRATORY_TRACT
  Filled 2023-10-11 (×6): qty 2

## 2023-10-11 NOTE — Plan of Care (Signed)
  Problem: Education: Goal: Knowledge of General Education information will improve Description Including pain rating scale, medication(s)/side effects and non-pharmacologic comfort measures Outcome: Progressing   Problem: Health Behavior/Discharge Planning: Goal: Ability to manage health-related needs will improve Outcome: Progressing   

## 2023-10-11 NOTE — Progress Notes (Signed)
Rounding Note    Patient Name: James [name] Date of Encounter: 10/11/2023  [facility] Cardiologist: Charlton Haws, MD   Subjective   Pt breathing  OK  Not back to baseline   Denies CP   Inpatient Medications    Scheduled Meds:  apixaban  5 mg Oral BID   atorvastatin  40 mg Oral Daily   budesonide (PULMICORT) nebulizer solution  0.5 mg Nebulization BID   carvedilol  6.25 mg Oral BID WC   furosemide  40 mg Intravenous BID   insulin aspart  0-5 Units Subcutaneous QHS   insulin aspart  0-9 Units Subcutaneous TID WC   ipratropium-albuterol  3 mL Nebulization Q8H   melatonin  6 mg Oral QHS   sacubitril-valsartan  1 tablet Oral BID   spironolactone  25 mg Oral Daily   Continuous Infusions:  PRN Meds: acetaminophen **OR** acetaminophen, albuterol, meclizine, ondansetron **OR** ondansetron (ZOFRAN) IV, polyethylene glycol   Vital Signs    Vitals:   10/10/23 1457 10/10/23 1719 10/10/23 2028 10/11/23 0454  BP: 110/79 111/87 106/86 123/88  Pulse: 62 (!) 59 63 63  Resp:   20 20  Temp: 98.4 F (36.9 C)  97.8 F (36.6 C) 98.3 F (36.8 C)  TempSrc: Oral  Oral Oral  SpO2: 97%  99% 97%  Weight:    115.8 kg  Height:        Intake/Output Summary (Last 24 hours) at 10/11/2023 0840 Last data filed at 10/11/2023 0600 Gross per 24 hour  Intake 300 ml  Output 3400 ml  Net -3100 ml      10/11/2023    4:54 AM 10/10/2023    5:47 AM 10/09/2023    9:18 PM  Last 3 Weights  Weight (lbs) 255 lb 4.7 oz 260 lb 12.9 oz 264 lb  Weight (kg) 115.8 kg 118.3 kg 119.75 kg      Telemetry    SR with PVCs  60 - Personally Reviewed  ECG    No new  - Personally Reviewed  Physical Exam   GEN: No acute distress.   Neck: Neck is full  Cardiac: RRR, no murmurs,  Respiratory: Mild rales at bases  GI: Soft, nontender, no hepatomegaly  MS: Tr to 1+ LE edema;   Labs    High Sensitivity Troponin:   Recent Labs  Lab 10/09/23 1713 10/09/23 2140  TROPONINIHS 36* 41*      Chemistry Recent Labs  Lab 10/09/23 1713 10/10/23 0454 10/11/23 0418  NA 138 139 137  K 4.0 3.3* 3.5  CL 104 103 101  CO2 26 28 27   GLUCOSE 120* 135* 102*  BUN 32* 29* 27*  CREATININE 1.59* 1.64* 1.62*  CALCIUM 8.5* 8.1* 8.1*  GFRNONAA 49* 47* 48*  ANIONGAP 8 8 9     Lipids No results for input(s): "CHOL", "TRIG", "HDL", "LABVLDL", "LDLCALC", "CHOLHDL" in the last 168 hours.  Hematology Recent Labs  Lab 10/09/23 1713  WBC 8.1  RBC 5.15  HGB 16.9  HCT 50.9  MCV 98.8  MCH 32.8  MCHC 33.2  RDW 13.8  PLT 183   Thyroid No results for input(s): "TSH", "FREET4" in the last 168 hours.  BNP Recent Labs  Lab 10/09/23 1713  BNP 4,442.0*    Net neg 6.1 L   DDimer No results for input(s): "DDIMER" in the last 168 hours.   Radiology    DG Chest 2 View Result Date: 10/09/2023 CLINICAL DATA:  Productive cough and shortness of for 1  week. Lower extremity edema EXAM: CHEST - 2 VIEW COMPARISON:  09/25/2023 FINDINGS: Stable mild cardiomegaly and chronic pulmonary vascular congestion. No No evidence of acute infiltrate or edema. No evidence of pleural effusion. Permanent single lead pacemaker remains in place IMPRESSION: Stable mild cardiomegaly and chronic pulmonary vascular congestion. No active disease. Electronically Signed   By: Danae Orleans M.D.   On: 10/09/2023 17:20    Cardiac Studies   Right heart catheterization 12/21/2022: IMPRESSION: Moderately elevated pre and post capillary filling pressures.  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  Severely reduced cardiac output / cardiac index by Fick & Thermodilution (TD likely more reflective of true output).    TEE 12/22/2022:  1. Left ventricular ejection fraction, by estimation, is 20 to 25%. The  left ventricle has severely decreased function. The left ventricular  internal cavity size was mildly to moderately dilated.   2. Right ventricular systolic function is  mildly reduced. The right  ventricular size is normal.   3. Left atrial size was moderately dilated. No left atrial/left atrial  appendage thrombus was detected.   4. The mitral valve is normal in structure. Trivial mitral valve  regurgitation.   5. The aortic valve is normal in structure. Aortic valve regurgitation is  not visualized.   6. The aortic root appears aneurysmal at the level of the sinuses of  Valsalva (near the RCC) measuring up to 4.7cm. There is no aortic  regurgitation.     Patient Profile     62 y.o. male with NICM presents with volume overload  Assessment & Plan    1  HFrEF   Pt with NICM    Last see in clinic by Dr Gasper Lloyd in 04/2023  Pt presented with volume overload on 12.25.24   Pt does not weigh himself at home   Denies changes in diet  NO CP   Denies palpitations  DIuresing with lasix   Still with volume increase on exam  I would keep diuresing with IV lasix today     Continue Coreg, spironolactone, Entresto,     ? Trigger for CHF   One possibility is more atrial arrhythmias   Unfortunately our device clinic says that pt's monitor at home not working right   Have not been able to contact hime despite sending letter   After D/C would make sure he has outpt follow up with 1. EP and 2 cardiology    2  s/p Biotronik ICD   NSVT on last check   Also some PAF/flutter   Wlll need to check in clinc   3  CKD   Stage IIIb     Cr stable at 1.62     4  PAF   On Eliquis     5  HL  On lipitor   In Jun 2024 LDL 74  HDL 43  Trig 136    6  OSA   Not on CPAP  7  Aortic aneurysim   Root is 4.7 cm   March 2024  REpeat scan this coming year  For questions or updates, please contact [facility] Please consult [email or web address] for contact info under        Signed, Dietrich Pates, MD  10/11/2023, 8:40 AM

## 2023-10-11 NOTE — Progress Notes (Signed)
Patient presented with another run of nonsustained vtach, EKG and vitals obtained and in chart. Dr. Thomes Dinning made aware, no new orders obtained.

## 2023-10-11 NOTE — Progress Notes (Signed)
Per tele patient has had some nonsustained runs of tach on two different occasions, Dr. Thomes Dinning made aware, no new orders obtained.

## 2023-10-11 NOTE — Hospital Course (Signed)
62 year old male with a history of HFrEF (20-25%), paroxysmal atrial fibrillation, hypertension, CKD stage III, diabetes mellitus type 2, pulm hypertension, COPD, tobacco abuse in remission presenting with dyspnea on exertion and lower extremity edema for about a month.  He feels that he has had about a 14 pound weight gain.  He denies any chest pain, fevers, chills, nausea, vomiting, diarrhea.  He reports that his PCP tried to start the patient on torsemide prior to his admission.  However, the patient states that he was not able to obtain it from the pharmacy.  He endorses compliance with his previous diuretic regimen as well as fluid and salt intake. He has a Biotronik ICD in place with follow-up by Dr. Ladona Ridgel. Last device check that I can see from August showed NSVT, no sustained arrhythmias or device therapies. He also has paroxysmal atrial fibrillation/flutter, no reported palpitations, not certain about rhythm frequency.  In the ED, the patient was afebrile hemodynamically stable with oxygen saturation 97% on room air.  WBC 8.1, hemoglobin 16.9, platelets 183.  Sodium 137, potassium 2.5, bicarbonate 27, serum creatinine 1.59.  BNP 4142.  Troponin 36>> 41.  Chest x-ray showed pulmonary vascular congestion.  The patient was started on IV furosemide.  Cardiology was consulted to assist with management.

## 2023-10-11 NOTE — Progress Notes (Signed)
   10/11/23 0454  ReDS Vest / Clip  BMI (Calculated) 29.51  Station Marker D  Ruler Value 41  ReDS Value Range < 36  ReDS Actual Value 34

## 2023-10-11 NOTE — Progress Notes (Signed)
PROGRESS NOTE  James [name] RUE:[id number] DOB: 1961/08/06 DOA: 10/09/2023 PCP: Billie Lade, MD  Brief History:  62 year old male with a history of HFrEF (20-25%), paroxysmal atrial fibrillation, hypertension, CKD stage III, diabetes mellitus type 2, pulm hypertension, COPD, tobacco abuse in remission presenting with dyspnea on exertion and lower extremity edema for about a month.  He feels that he has had about a 14 pound weight gain.  He denies any chest pain, fevers, chills, nausea, vomiting, diarrhea.  He reports that his PCP tried to start the patient on torsemide prior to his admission.  However, the patient states that he was not able to obtain it from the pharmacy.  He endorses compliance with his previous diuretic regimen as well as fluid and salt intake. He has a Biotronik ICD in place with follow-up by Dr. Ladona Ridgel. Last device check that I can see from August showed NSVT, no sustained arrhythmias or device therapies. He also has paroxysmal atrial fibrillation/flutter, no reported palpitations, not certain about rhythm frequency.  In the ED, the patient was afebrile hemodynamically stable with oxygen saturation 97% on room air.  WBC 8.1, hemoglobin 16.9, platelets 183.  Sodium 137, potassium 2.5, bicarbonate 27, serum creatinine 1.59.  BNP 4142.  Troponin 36>> 41.  Chest x-ray showed pulmonary vascular congestion.  The patient was started on IV furosemide.  Cardiology was consulted to assist with management.   Assessment/Plan: Acute on chronic HFrEF -12/21/2022 echo EF 20-25%, mild decreased RVF, trivial MR/TR -Appreciate cardiology consult -Continue IV furosemide twice daily -12/26 ReDS = 33 -neg 6.1L -Continue Coreg, Entresto, spironolactone  Paroxysmal atrial fibrillation -Continue apixaban and carvedilol  CKD stage IIIa -Baseline creatinine 1.3-1.6 -Monitor with diuresis  Diabetes mellitus type 2, controlled -10/09/2023 hemoglobin A1c 7.2 -Holding metformin  and Farxiga -NovoLog sliding scale  Essential hypertension -Continue Entresto, carvedilol, spironolactone  Hyperlipidemia -Continue statin  COPD -Start bronchodilators -Patient stopped smoking about 1 month ago after 30-pack-year history           Family Communication:  no  Family at bedside  Consultants: Cardiology  Code Status:  FULL   DVT Prophylaxis: Apixaban   Procedures: As Listed in Progress Note Above  Antibiotics: None       Subjective: Patient states that his breathing is improving.  He states that his lower extremity edema is also improving.  He denies any chest pain, headache, nausea, vomiting, direct abdominal pain.  Objective: Vitals:   10/10/23 1457 10/10/23 1719 10/10/23 2028 10/11/23 0454  BP: 110/79 111/87 106/86 123/88  Pulse: 62 (!) 59 63 63  Resp:   20 20  Temp: 98.4 F (36.9 C)  97.8 F (36.6 C) 98.3 F (36.8 C)  TempSrc: Oral  Oral Oral  SpO2: 97%  99% 97%  Weight:    115.8 kg  Height:        Intake/Output Summary (Last 24 hours) at 10/11/2023 0750 Last data filed at 10/11/2023 0600 Gross per 24 hour  Intake 300 ml  Output 3800 ml  Net -3500 ml   Weight change: 0.586 kg Exam:  General:  Pt is alert, follows commands appropriately, not in acute distress HEENT: No icterus, No thrush, No neck mass, /AT Cardiovascular: RRR, S1/S2, no rubs, no gallops Respiratory: Bibasilar rales.  No wheezing Abdomen: Soft/+BS, non tender, non distended, no guarding Extremities: trace LE edema, No lymphangitis, No petechiae, No rashes, no synovitis   Data Reviewed: I have personally reviewed following labs  and imaging studies Basic Metabolic Panel: Recent Labs  Lab 10/09/23 1713 10/10/23 0454 10/11/23 0418  NA 138 139 137  K 4.0 3.3* 3.5  CL 104 103 101  CO2 26 28 27   GLUCOSE 120* 135* 102*  BUN 32* 29* 27*  CREATININE 1.59* 1.64* 1.62*  CALCIUM 8.5* 8.1* 8.1*   Liver Function Tests: No results for input(s): "AST",  "ALT", "ALKPHOS", "BILITOT", "PROT", "ALBUMIN" in the last 168 hours. No results for input(s): "LIPASE", "AMYLASE" in the last 168 hours. No results for input(s): "AMMONIA" in the last 168 hours. Coagulation Profile: No results for input(s): "INR", "PROTIME" in the last 168 hours. CBC: Recent Labs  Lab 10/09/23 1713  WBC 8.1  HGB 16.9  HCT 50.9  MCV 98.8  PLT 183   Cardiac Enzymes: No results for input(s): "CKTOTAL", "CKMB", "CKMBINDEX", "TROPONINI" in the last 168 hours. BNP: Invalid input(s): "POCBNP" CBG: Recent Labs  Lab 10/10/23 0740 10/10/23 1133 10/10/23 1641 10/10/23 2031 10/11/23 0747  GLUCAP 110* 140* 152* 115* 100*   HbA1C: Recent Labs    10/09/23 2140  HGBA1C 7.2*   Urine analysis:    Component Value Date/Time   COLORURINE YELLOW 08/11/2023 1250   APPEARANCEUR CLEAR 08/11/2023 1250   APPEARANCEUR Clear 05/27/2023 1406   LABSPEC 1.021 08/11/2023 1250   PHURINE 5.0 08/11/2023 1250   GLUCOSEU >=500 (A) 08/11/2023 1250   HGBUR SMALL (A) 08/11/2023 1250   BILIRUBINUR NEGATIVE 08/11/2023 1250   BILIRUBINUR Negative 05/27/2023 1406   KETONESUR NEGATIVE 08/11/2023 1250   PROTEINUR 100 (A) 08/11/2023 1250   NITRITE NEGATIVE 08/11/2023 1250   LEUKOCYTESUR NEGATIVE 08/11/2023 1250   Sepsis Labs: @LABRCNTIP (procalcitonin:4,lacticidven:4) )No results found for this or any previous visit (from the past 240 hours).   Scheduled Meds:  apixaban  5 mg Oral BID   atorvastatin  40 mg Oral Daily   carvedilol  6.25 mg Oral BID WC   furosemide  40 mg Intravenous BID   insulin aspart  0-5 Units Subcutaneous QHS   insulin aspart  0-9 Units Subcutaneous TID WC   melatonin  6 mg Oral QHS   sacubitril-valsartan  1 tablet Oral BID   spironolactone  25 mg Oral Daily   Continuous Infusions:  Procedures/Studies: DG Chest 2 View Result Date: 10/09/2023 CLINICAL DATA:  Productive cough and shortness of for 1 week. Lower extremity edema EXAM: CHEST - 2 VIEW  COMPARISON:  09/25/2023 FINDINGS: Stable mild cardiomegaly and chronic pulmonary vascular congestion. No No evidence of acute infiltrate or edema. No evidence of pleural effusion. Permanent single lead pacemaker remains in place IMPRESSION: Stable mild cardiomegaly and chronic pulmonary vascular congestion. No active disease. Electronically Signed   By: Danae Orleans M.D.   On: 10/09/2023 17:20   DG Chest 2 View Result Date: 09/30/2023 CLINICAL DATA:  62 year old male with shortness of breath EXAM: CHEST - 2 VIEW COMPARISON:  07/22/2022 FINDINGS: Cardiomediastinal silhouette unchanged in size and contour. No evidence of central vascular congestion. No interlobular septal thickening. Left chest wall pacing device/AICD with single lead. No pneumothorax or pleural effusion. Coarsened interstitial markings, with no confluent airspace disease. No acute displaced fracture. Degenerative changes of the spine. IMPRESSION: Negative for acute cardiopulmonary disease Electronically Signed   By: Gilmer Mor D.O.   On: 09/30/2023 12:52    Catarina Hartshorn, DO  [facility]  If 7PM-7AM, please contact night-coverage [email or web address] Password [email or web address] 10/11/2023, 7:50 AM   LOS: 2 days

## 2023-10-12 DIAGNOSIS — I5043 Acute on chronic combined systolic (congestive) and diastolic (congestive) heart failure: Secondary | ICD-10-CM | POA: Diagnosis not present

## 2023-10-12 DIAGNOSIS — I10 Essential (primary) hypertension: Secondary | ICD-10-CM | POA: Diagnosis not present

## 2023-10-12 DIAGNOSIS — I480 Paroxysmal atrial fibrillation: Secondary | ICD-10-CM | POA: Diagnosis not present

## 2023-10-12 DIAGNOSIS — N1831 Chronic kidney disease, stage 3a: Secondary | ICD-10-CM | POA: Diagnosis not present

## 2023-10-12 LAB — BASIC METABOLIC PANEL
Anion gap: 7 (ref 5–15)
BUN: 27 mg/dL — ABNORMAL HIGH (ref 8–23)
CO2: 30 mmol/L (ref 22–32)
Calcium: 8.5 mg/dL — ABNORMAL LOW (ref 8.9–10.3)
Chloride: 99 mmol/L (ref 98–111)
Creatinine, Ser: 1.5 mg/dL — ABNORMAL HIGH (ref 0.61–1.24)
GFR, Estimated: 52 mL/min — ABNORMAL LOW (ref >60)
Glucose, Bld: 159 mg/dL — ABNORMAL HIGH (ref 70–99)
Potassium: 3.7 mmol/L (ref 3.5–5.1)
Sodium: 136 mmol/L (ref 135–145)

## 2023-10-12 LAB — GLUCOSE, CAPILLARY
Glucose-Capillary: 138 mg/dL — ABNORMAL HIGH (ref 70–99)
Glucose-Capillary: 104 mg/dL — ABNORMAL HIGH (ref 70–99)
Glucose-Capillary: 138 mg/dL — ABNORMAL HIGH (ref 70–99)
Glucose-Capillary: 140 mg/dL — ABNORMAL HIGH (ref 70–99)

## 2023-10-12 LAB — MAGNESIUM: Magnesium: 2.3 mg/dL (ref 1.7–2.4)

## 2023-10-12 MED ORDER — POTASSIUM CHLORIDE CRYS ER 20 MEQ PO TBCR
40.0000 meq | EXTENDED_RELEASE_TABLET | Freq: Once | ORAL | Status: AC
  Administered 2023-10-12: 40 meq via ORAL
  Filled 2023-10-12: qty 2

## 2023-10-12 NOTE — Progress Notes (Signed)
Per tele patient had a 5 beat run of vtach, Dr. Thomes Dinning made aware, see new orders.

## 2023-10-12 NOTE — Plan of Care (Signed)
  Problem: Education: Goal: Knowledge of General Education information will improve Description: Including pain rating scale, medication(s)/side effects and non-pharmacologic comfort measures Outcome: Progressing   Problem: Health Behavior/Discharge Planning: Goal: Ability to manage health-related needs will improve Outcome: Progressing   Problem: Clinical Measurements: Goal: Ability to maintain clinical measurements within normal limits will improve Outcome: Progressing Goal: Will remain free from infection Outcome: Progressing Goal: Diagnostic test results will improve Outcome: Progressing Goal: Respiratory complications will improve Outcome: Progressing Goal: Cardiovascular complication will be avoided Outcome: Progressing   Problem: Activity: Goal: Risk for activity intolerance will decrease Outcome: Progressing   Problem: Nutrition: Goal: Adequate nutrition will be maintained Outcome: Progressing   Problem: Coping: Goal: Level of anxiety will decrease Outcome: Progressing   Problem: Elimination: Goal: Will not experience complications related to bowel motility Outcome: Progressing Goal: Will not experience complications related to urinary retention Outcome: Progressing   Problem: Pain Management: Goal: General experience of comfort will improve Outcome: Progressing   Problem: Safety: Goal: Ability to remain free from injury will improve Outcome: Progressing   Problem: Skin Integrity: Goal: Risk for impaired skin integrity will decrease Outcome: Progressing   Problem: Education: Goal: Ability to demonstrate management of disease process will improve Outcome: Progressing Goal: Ability to verbalize understanding of medication therapies will improve Outcome: Progressing Goal: Individualized Educational Video(s) Outcome: Progressing   Problem: Activity: Goal: Capacity to carry out activities will improve Outcome: Progressing   Problem: Cardiac: Goal:  Ability to achieve and maintain adequate cardiopulmonary perfusion will improve Outcome: Progressing   Problem: Education: Goal: Ability to describe self-care measures that may prevent or decrease complications (Diabetes Survival Skills Education) will improve Outcome: Progressing Goal: Individualized Educational Video(s) Outcome: Progressing   Problem: Coping: Goal: Ability to adjust to condition or change in health will improve Outcome: Progressing   Problem: Fluid Volume: Goal: Ability to maintain a balanced intake and output will improve Outcome: Progressing   Problem: Health Behavior/Discharge Planning: Goal: Ability to identify and utilize available resources and services will improve Outcome: Progressing Goal: Ability to manage health-related needs will improve Outcome: Progressing   Problem: Metabolic: Goal: Ability to maintain appropriate glucose levels will improve Outcome: Progressing   Problem: Nutritional: Goal: Maintenance of adequate nutrition will improve Outcome: Progressing Goal: Progress toward achieving an optimal weight will improve Outcome: Progressing   Problem: Skin Integrity: Goal: Risk for impaired skin integrity will decrease Outcome: Progressing   Problem: Tissue Perfusion: Goal: Adequacy of tissue perfusion will improve Outcome: Progressing

## 2023-10-12 NOTE — Progress Notes (Signed)
PROGRESS NOTE  James [name]:[id number] DOB: 25-Nov-1960 DOA: 10/09/2023 PCP: Billie Lade, MD  Brief History:  62 year old male with a history of HFrEF (20-25%), paroxysmal atrial fibrillation, hypertension, CKD stage III, diabetes mellitus type 2, pulm hypertension, COPD, tobacco abuse in remission presenting with dyspnea on exertion and lower extremity edema for about a month.  He feels that he has had about a 14 pound weight gain.  He denies any chest pain, fevers, chills, nausea, vomiting, diarrhea.  He reports that his PCP tried to start the patient on torsemide prior to his admission.  However, the patient states that he was not able to obtain it from the pharmacy.  He endorses compliance with his previous diuretic regimen as well as fluid and salt intake. He has a Biotronik ICD in place with follow-up by Dr. Ladona Ridgel. Last device check that I can see from August showed NSVT, no sustained arrhythmias or device therapies. He also has paroxysmal atrial fibrillation/flutter, no reported palpitations, not certain about rhythm frequency.  In the ED, the patient was afebrile hemodynamically stable with oxygen saturation 97% on room air.  WBC 8.1, hemoglobin 16.9, platelets 183.  Sodium 137, potassium 2.5, bicarbonate 27, serum creatinine 1.59.  BNP 4142.  Troponin 36>> 41.  Chest x-ray showed pulmonary vascular congestion.  The patient was started on IV furosemide.  Cardiology was consulted to assist with management.   Assessment/Plan: Acute on chronic HFrEF -12/21/2022 echo EF 20-25%, mild decreased RVF, trivial MR/TR -Appreciate cardiology consult -remains clinically fluid overloaded -Continue IV furosemide twice daily -12/28 ReDS = 36 -neg 10L -neg 12 lbs -Continue Coreg, Entresto, spironolactone   Paroxysmal atrial fibrillation -Continue apixaban and carvedilol   CKD stage IIIa -Baseline creatinine 1.3-1.6 -Monitor with diuresis   Diabetes mellitus type 2,  controlled -10/09/2023 hemoglobin A1c 7.2 -Holding metformin and Farxiga -NovoLog sliding scale   Essential hypertension -Continue Entresto, carvedilol, spironolactone -BP controlled   Hyperlipidemia -Continue statin   COPD -Started bronchodilators -Patient stopped smoking about 1 month ago after 30-pack-year history        Family Communication:   daughter at bedside 12/28  Consultants:  cardiology  Code Status:  FULL   DVT Prophylaxis:  apixaban   Procedures: As Listed in Progress Note Above  Antibiotics: None       Subjective: Patient denies fevers, chills, headache, chest pain, dyspnea, nausea, vomiting, diarrhea, abdominal pain, dysuria, hematuria, hematochezia, and melena.   Objective: Vitals:   10/12/23 0755 10/12/23 0757 10/12/23 1409 10/12/23 1426  BP:   91/62   Pulse:   (!) 57   Resp:      Temp:   98.1 F (36.7 C)   TempSrc:      SpO2: 98% 100% 100% 94%  Weight:      Height:        Intake/Output Summary (Last 24 hours) at 10/12/2023 1649 Last data filed at 10/12/2023 1230 Gross per 24 hour  Intake 600 ml  Output 4400 ml  Net -3800 ml   Weight change: -2.3 kg Exam:  General:  Pt is alert, follows commands appropriately, not in acute distress HEENT: No icterus, No thrush, No neck mass, [place]/AT Cardiovascular: RRR, S1/S2, no rubs, no gallops Respiratory: fine bibasilar crackles.  No wheeze Abdomen: Soft/+BS, non tender, non distended, no guarding Extremities: 1 + LE edema, No lymphangitis, No petechiae, No rashes, no synovitis   Data Reviewed: I have personally reviewed following labs and imaging  studies Basic Metabolic Panel: Recent Labs  Lab 10/09/23 1713 10/10/23 0454 10/11/23 0418 10/12/23 0518  NA 138 139 137 136  K 4.0 3.3* 3.5 3.7  CL 104 103 101 99  CO2 26 28 27 30   GLUCOSE 120* 135* 102* 159*  BUN 32* 29* 27* 27*  CREATININE 1.59* 1.64* 1.62* 1.50*  CALCIUM 8.5* 8.1* 8.1* 8.5*  MG  --   --   --  2.3   Liver  Function Tests: No results for input(s): "AST", "ALT", "ALKPHOS", "BILITOT", "PROT", "ALBUMIN" in the last 168 hours. No results for input(s): "LIPASE", "AMYLASE" in the last 168 hours. No results for input(s): "AMMONIA" in the last 168 hours. Coagulation Profile: No results for input(s): "INR", "PROTIME" in the last 168 hours. CBC: Recent Labs  Lab 10/09/23 1713  WBC 8.1  HGB 16.9  HCT 50.9  MCV 98.8  PLT 183   Cardiac Enzymes: No results for input(s): "CKTOTAL", "CKMB", "CKMBINDEX", "TROPONINI" in the last 168 hours. BNP: Invalid input(s): "POCBNP" CBG: Recent Labs  Lab 10/11/23 1636 10/11/23 2010 10/12/23 0742 10/12/23 1157 10/12/23 1624  GLUCAP 123* 132* 138* 104* 138*   HbA1C: Recent Labs    10/09/23 2140  HGBA1C 7.2*   Urine analysis:    Component Value Date/Time   COLORURINE YELLOW 08/11/2023 1250   APPEARANCEUR CLEAR 08/11/2023 1250   APPEARANCEUR Clear 05/27/2023 1406   LABSPEC 1.021 08/11/2023 1250   PHURINE 5.0 08/11/2023 1250   GLUCOSEU >=500 (A) 08/11/2023 1250   HGBUR SMALL (A) 08/11/2023 1250   BILIRUBINUR NEGATIVE 08/11/2023 1250   BILIRUBINUR Negative 05/27/2023 1406   KETONESUR NEGATIVE 08/11/2023 1250   PROTEINUR 100 (A) 08/11/2023 1250   NITRITE NEGATIVE 08/11/2023 1250   LEUKOCYTESUR NEGATIVE 08/11/2023 1250   Sepsis Labs: @LABRCNTIP (procalcitonin:4,lacticidven:4) )No results found for this or any previous visit (from the past 240 hours).   Scheduled Meds:  apixaban  5 mg Oral BID   atorvastatin  40 mg Oral Daily   budesonide (PULMICORT) nebulizer solution  0.5 mg Nebulization BID   carvedilol  6.25 mg Oral BID WC   furosemide  40 mg Intravenous BID   insulin aspart  0-5 Units Subcutaneous QHS   insulin aspart  0-9 Units Subcutaneous TID WC   ipratropium-albuterol  3 mL Nebulization TID   melatonin  6 mg Oral QHS   sacubitril-valsartan  1 tablet Oral BID   spironolactone  25 mg Oral Daily   Continuous  Infusions:  Procedures/Studies: ECHOCARDIOGRAM COMPLETE Result Date: 10/11/2023    ECHOCARDIOGRAM REPORT   Patient Name:   James [name] Date of Exam: 10/11/2023 Medical Rec #:  [id number]         Height:       78.0 in Accession #:    [id number]        Weight:       255.3 lb Date of Birth:  02-06-1961         BSA:          2.504 m Patient Age:    62 years          BP:           114/88 mmHg Patient Gender: M                 HR:           72 bpm. [facility] Procedure: 2D Echo, Cardiac Doppler, Color Doppler and Intracardiac  Opacification Agent Indications:    CHF  History:        Patient has prior history of Echocardiogram examinations, most                 recent 12/21/2022. CHF and Cardiomyopathy; Arrythmias:Atrial                 Fibrillation.  Sonographer:    Webb Laws Referring Phys: [id number] [address] Nine IMPRESSIONS  1. Left ventricular ejection fraction, by estimation, is 20%. The left ventricle has severely decreased function. The left ventricle demonstrates global hypokinesis. The left ventricular internal cavity size was severely dilated. Left ventricular diastolic parameters are indeterminate. Elevated left atrial pressure.  2. Right ventricular systolic function is moderately reduced. The right ventricular size is mildly enlarged.  3. Left atrial size was severely dilated.  4. Right atrial size was severely dilated.  5. The mitral valve is normal in structure. Mild mitral valve regurgitation.  6. The aortic valve is tricuspid. Aortic valve regurgitation is not visualized.  7. Aortic root normal for BSA . Aortic dilatation noted.  8. The inferior vena cava is dilated in size with <50% respiratory variability, suggesting right atrial pressure of 15 mmHg. FINDINGS  Left Ventricle: Left ventricular ejection fraction, by estimation, is 20%. The left ventricle has severely decreased function. The left ventricle demonstrates global hypokinesis. Definity contrast agent was given  IV to delineate the left ventricular endocardial borders. The left ventricular internal cavity size was severely dilated. There is no left ventricular hypertrophy. Left ventricular diastolic parameters are indeterminate. Elevated left atrial pressure. Right Ventricle: The right ventricular size is mildly enlarged. Right vetricular wall thickness was not assessed. Right ventricular systolic function is moderately reduced. Left Atrium: Left atrial size was severely dilated. Right Atrium: Right atrial size was severely dilated. Pericardium: There is no evidence of pericardial effusion. Mitral Valve: The mitral valve is normal in structure. Mild mitral valve regurgitation. Tricuspid Valve: The tricuspid valve is normal in structure. Tricuspid valve regurgitation is mild. Aortic Valve: The aortic valve is tricuspid. Aortic valve regurgitation is not visualized. Pulmonic Valve: The pulmonic valve was normal in structure. Pulmonic valve regurgitation is not visualized. Aorta: Aortic root normal for BSA. Aortic dilatation noted and the aortic root is normal in size and structure. Venous: The inferior vena cava is dilated in size with less than 50% respiratory variability, suggesting right atrial pressure of 15 mmHg. IAS/Shunts: No atrial level shunt detected by color flow Doppler. Additional Comments: A device lead is visualized.  LEFT VENTRICLE PLAX 2D LVIDd:         6.50 cm      Diastology LVIDs:         5.80 cm      LV e' medial:   2.10 cm/s LV PW:         1.00 cm      LV E/e' medial: 39.7 LV IVS:        1.10 cm  LV Volumes (MOD) LV vol d, MOD A2C: 303.0 ml LV vol d, MOD A4C: 326.0 ml LV vol s, MOD A2C: 256.0 ml LV vol s, MOD A4C: 247.0 ml LV SV MOD A2C:     47.0 ml LV SV MOD A4C:     326.0 ml LV SV MOD BP:      60.4 ml RIGHT VENTRICLE            IVC RV Basal diam:  4.40 cm    IVC diam: 3.20 cm  RV Mid diam:    3.60 cm RV S prime:     9.98 cm/s TAPSE (M-mode): 2.1 cm LEFT ATRIUM              Index        RIGHT ATRIUM            Index LA diam:        6.00 cm  2.40 cm/m   RA Area:     29.20 cm LA Vol (A2C):   146.0 ml 58.30 ml/m  RA Volume:   104.00 ml 41.53 ml/m LA Vol (A4C):   127.0 ml 50.71 ml/m LA Biplane Vol: 139.0 ml 55.50 ml/m  AORTIC VALVE LVOT Vmax:   71.70 cm/s LVOT Vmean:  45.400 cm/s LVOT VTI:    0.108 m  AORTA Ao Root diam: 4.30 cm MITRAL VALVE               TRICUSPID VALVE MV Area (PHT): 6.71 cm    TR Peak grad:   38.9 mmHg MV Decel Time: 113 msec    TR Vmax:        312.00 cm/s MV E velocity: 83.40 cm/s MV A velocity: 24.30 cm/s  SHUNTS MV E/A ratio:  3.43        Systemic VTI: 0.11 m Dietrich Pates MD Electronically signed by Dietrich Pates MD Signature Date/Time: 10/11/2023/2:25:26 PM    Final    DG Chest 2 View Result Date: 10/09/2023 CLINICAL DATA:  Productive cough and shortness of for 1 week. Lower extremity edema EXAM: CHEST - 2 VIEW COMPARISON:  09/25/2023 FINDINGS: Stable mild cardiomegaly and chronic pulmonary vascular congestion. No No evidence of acute infiltrate or edema. No evidence of pleural effusion. Permanent single lead pacemaker remains in place IMPRESSION: Stable mild cardiomegaly and chronic pulmonary vascular congestion. No active disease. Electronically Signed   By: Danae Orleans M.D.   On: 10/09/2023 17:20   DG Chest 2 View Result Date: 09/30/2023 CLINICAL DATA:  62 year old male with shortness of breath EXAM: CHEST - 2 VIEW COMPARISON:  07/22/2022 FINDINGS: Cardiomediastinal silhouette unchanged in size and contour. No evidence of central vascular congestion. No interlobular septal thickening. Left chest wall pacing device/AICD with single lead. No pneumothorax or pleural effusion. Coarsened interstitial markings, with no confluent airspace disease. No acute displaced fracture. Degenerative changes of the spine. IMPRESSION: Negative for acute cardiopulmonary disease Electronically Signed   By: Gilmer Mor D.O.   On: 09/30/2023 12:52    Catarina Hartshorn, DO  [facility]  If 7PM-7AM,  please contact night-coverage [email or web address] Password [facility] 10/12/2023, 4:49 PM   LOS: 3 days

## 2023-10-13 DIAGNOSIS — N1831 Chronic kidney disease, stage 3a: Secondary | ICD-10-CM | POA: Diagnosis not present

## 2023-10-13 DIAGNOSIS — I5043 Acute on chronic combined systolic (congestive) and diastolic (congestive) heart failure: Secondary | ICD-10-CM | POA: Diagnosis not present

## 2023-10-13 DIAGNOSIS — I480 Paroxysmal atrial fibrillation: Secondary | ICD-10-CM | POA: Diagnosis not present

## 2023-10-13 LAB — GLUCOSE, CAPILLARY
Glucose-Capillary: 124 mg/dL — ABNORMAL HIGH (ref 70–99)
Glucose-Capillary: 158 mg/dL — ABNORMAL HIGH (ref 70–99)
Glucose-Capillary: 104 mg/dL — ABNORMAL HIGH (ref 70–99)
Glucose-Capillary: 120 mg/dL — ABNORMAL HIGH (ref 70–99)

## 2023-10-13 LAB — BASIC METABOLIC PANEL
Anion gap: 10 (ref 5–15)
BUN: 29 mg/dL — ABNORMAL HIGH (ref 8–23)
CO2: 28 mmol/L (ref 22–32)
Calcium: 8.4 mg/dL — ABNORMAL LOW (ref 8.9–10.3)
Chloride: 100 mmol/L (ref 98–111)
Creatinine, Ser: 1.61 mg/dL — ABNORMAL HIGH (ref 0.61–1.24)
GFR, Estimated: 48 mL/min — ABNORMAL LOW (ref >60)
Glucose, Bld: 111 mg/dL — ABNORMAL HIGH (ref 70–99)
Potassium: 3.5 mmol/L (ref 3.5–5.1)
Sodium: 138 mmol/L (ref 135–145)

## 2023-10-13 MED ORDER — FUROSEMIDE 40 MG PO TABS
40.0000 mg | ORAL_TABLET | Freq: Every day | ORAL | Status: DC
  Administered 2023-10-14: 40 mg via ORAL
  Filled 2023-10-13: qty 1

## 2023-10-13 MED ORDER — ORAL CARE MOUTH RINSE: 15.0000 mL | OROMUCOSAL | Status: DC | PRN

## 2023-10-13 NOTE — Plan of Care (Signed)
  Problem: Education: Goal: Knowledge of General Education information will improve Description: Including pain rating scale, medication(s)/side effects and non-pharmacologic comfort measures Outcome: Progressing   Problem: Health Behavior/Discharge Planning: Goal: Ability to manage health-related needs will improve Outcome: Progressing   Problem: Clinical Measurements: Goal: Ability to maintain clinical measurements within normal limits will improve Outcome: Progressing Goal: Will remain free from infection Outcome: Progressing Goal: Diagnostic test results will improve Outcome: Progressing Goal: Respiratory complications will improve Outcome: Progressing Goal: Cardiovascular complication will be avoided Outcome: Progressing   Problem: Activity: Goal: Risk for activity intolerance will decrease Outcome: Progressing   Problem: Nutrition: Goal: Adequate nutrition will be maintained Outcome: Progressing   Problem: Coping: Goal: Level of anxiety will decrease Outcome: Progressing   Problem: Elimination: Goal: Will not experience complications related to bowel motility Outcome: Progressing Goal: Will not experience complications related to urinary retention Outcome: Progressing   Problem: Pain Management: Goal: General experience of comfort will improve Outcome: Progressing   Problem: Safety: Goal: Ability to remain free from injury will improve Outcome: Progressing   Problem: Skin Integrity: Goal: Risk for impaired skin integrity will decrease Outcome: Progressing   Problem: Education: Goal: Ability to demonstrate management of disease process will improve Outcome: Progressing Goal: Ability to verbalize understanding of medication therapies will improve Outcome: Progressing Goal: Individualized Educational Video(s) Outcome: Progressing   Problem: Activity: Goal: Capacity to carry out activities will improve Outcome: Progressing   Problem: Cardiac: Goal:  Ability to achieve and maintain adequate cardiopulmonary perfusion will improve Outcome: Progressing   Problem: Education: Goal: Ability to describe self-care measures that may prevent or decrease complications (Diabetes Survival Skills Education) will improve Outcome: Progressing Goal: Individualized Educational Video(s) Outcome: Progressing   Problem: Coping: Goal: Ability to adjust to condition or change in health will improve Outcome: Progressing   Problem: Fluid Volume: Goal: Ability to maintain a balanced intake and output will improve Outcome: Progressing   Problem: Health Behavior/Discharge Planning: Goal: Ability to identify and utilize available resources and services will improve Outcome: Progressing Goal: Ability to manage health-related needs will improve Outcome: Progressing   Problem: Metabolic: Goal: Ability to maintain appropriate glucose levels will improve Outcome: Progressing   Problem: Nutritional: Goal: Maintenance of adequate nutrition will improve Outcome: Progressing Goal: Progress toward achieving an optimal weight will improve Outcome: Progressing   Problem: Skin Integrity: Goal: Risk for impaired skin integrity will decrease Outcome: Progressing   Problem: Tissue Perfusion: Goal: Adequacy of tissue perfusion will improve Outcome: Progressing

## 2023-10-13 NOTE — Plan of Care (Signed)

## 2023-10-13 NOTE — Progress Notes (Signed)
PROGRESS NOTE  James [name] ZOX:[id number] DOB: 10/23/1960 DOA: 10/09/2023 PCP: Billie Lade, MD  Brief History:  62 year old male with a history of HFrEF (20-25%), paroxysmal atrial fibrillation, hypertension, CKD stage III, diabetes mellitus type 2, pulm hypertension, COPD, tobacco abuse in remission presenting with dyspnea on exertion and lower extremity edema for about a month.  He feels that he has had about a 14 pound weight gain.  He denies any chest pain, fevers, chills, nausea, vomiting, diarrhea.  He reports that his PCP tried to start the patient on torsemide prior to his admission.  However, the patient states that he was not able to obtain it from the pharmacy.  He endorses compliance with his previous diuretic regimen as well as fluid and salt intake. He has a Biotronik ICD in place with follow-up by Dr. Ladona Ridgel. Last device check that I can see from August showed NSVT, no sustained arrhythmias or device therapies. He also has paroxysmal atrial fibrillation/flutter, no reported palpitations, not certain about rhythm frequency.  In the ED, the patient was afebrile hemodynamically stable with oxygen saturation 97% on room air.  WBC 8.1, hemoglobin 16.9, platelets 183.  Sodium 137, potassium 2.5, bicarbonate 27, serum creatinine 1.59.  BNP 4142.  Troponin 36>> 41.  Chest x-ray showed pulmonary vascular congestion.  The patient was started on IV furosemide.  Cardiology was consulted to assist with management.   Assessment/Plan:  Acute on chronic HFrEF -12/21/2022 echo EF 20-25%, mild decreased RVF, trivial MR/TR -Appreciate cardiology consult -Continue IV furosemide twice daily>>po lasix -12/28 ReDS = 34 -neg 12L -neg 20 lbs -Continue Coreg, Entresto, spironolactone   Paroxysmal atrial fibrillation -Continue apixaban and carvedilol   CKD stage IIIa -Baseline creatinine 1.3-1.6 -Monitor with diuresis   Diabetes mellitus type 2, controlled -10/09/2023 hemoglobin  A1c 7.2 -Holding metformin and Farxiga -NovoLog sliding scale   Essential hypertension -Continue Entresto, carvedilol, spironolactone -BP controlled   Hyperlipidemia -Continue statin   COPD -Started bronchodilators -Patient stopped smoking about 1 month ago after 30-pack-year history             Family Communication:   daughter at bedside 12/29   Consultants:  cardiology   Code Status:  FULL    DVT Prophylaxis:  apixaban     Procedures: As Listed in Progress Note Above   Antibiotics: None           Subjective: Patient denies fevers, chills, headache, chest pain, dyspnea, nausea, vomiting, diarrhea, abdominal pain, dysuria, hematuria, hematochezia, and melena.   Objective: Vitals:   10/13/23 0821 10/13/23 1326 10/13/23 1426 10/13/23 1707  BP:  (!) 81/65  106/80  Pulse:  62    Resp:  17    Temp:  98.1 F (36.7 C)    TempSrc:      SpO2: 99% 97% 97%   Weight:      Height:        Intake/Output Summary (Last 24 hours) at 10/13/2023 1812 Last data filed at 10/13/2023 1300 Gross per 24 hour  Intake 600 ml  Output 1700 ml  Net -1100 ml   Weight change: -1.8 kg Exam:  General:  Pt is alert, follows commands appropriately, not in acute distress HEENT: No icterus, No thrush, No neck mass, Cale/AT Cardiovascular: RRR, S1/S2, no rubs, no gallops Respiratory: CTA bilaterally, no wheezing, no crackles, no rhonchi Abdomen: Soft/+BS, non tender, non distended, no guarding Extremities: No edema, No lymphangitis, No petechiae, No rashes, no  synovitis   Data Reviewed: I have personally reviewed following labs and imaging studies Basic Metabolic Panel: Recent Labs  Lab 10/09/23 1713 10/10/23 0454 10/11/23 0418 10/12/23 0518 10/13/23 0435  NA 138 139 137 136 138  K 4.0 3.3* 3.5 3.7 3.5  CL 104 103 101 99 100  CO2 26 28 27 30 28   GLUCOSE 120* 135* 102* 159* 111*  BUN 32* 29* 27* 27* 29*  CREATININE 1.59* 1.64* 1.62* 1.50* 1.61*  CALCIUM 8.5* 8.1*  8.1* 8.5* 8.4*  MG  --   --   --  2.3  --    Liver Function Tests: No results for input(s): "AST", "ALT", "ALKPHOS", "BILITOT", "PROT", "ALBUMIN" in the last 168 hours. No results for input(s): "LIPASE", "AMYLASE" in the last 168 hours. No results for input(s): "AMMONIA" in the last 168 hours. Coagulation Profile: No results for input(s): "INR", "PROTIME" in the last 168 hours. CBC: Recent Labs  Lab 10/09/23 1713  WBC 8.1  HGB 16.9  HCT 50.9  MCV 98.8  PLT 183   Cardiac Enzymes: No results for input(s): "CKTOTAL", "CKMB", "CKMBINDEX", "TROPONINI" in the last 168 hours. BNP: Invalid input(s): "POCBNP" CBG: Recent Labs  Lab 10/12/23 1624 10/12/23 2132 10/13/23 0748 10/13/23 1137 10/13/23 1629  GLUCAP 138* 140* 124* 158* 104*   HbA1C: No results for input(s): "HGBA1C" in the last 72 hours. Urine analysis:    Component Value Date/Time   COLORURINE YELLOW 08/11/2023 1250   APPEARANCEUR CLEAR 08/11/2023 1250   APPEARANCEUR Clear 05/27/2023 1406   LABSPEC 1.021 08/11/2023 1250   PHURINE 5.0 08/11/2023 1250   GLUCOSEU >=500 (A) 08/11/2023 1250   HGBUR SMALL (A) 08/11/2023 1250   BILIRUBINUR NEGATIVE 08/11/2023 1250   BILIRUBINUR Negative 05/27/2023 1406   KETONESUR NEGATIVE 08/11/2023 1250   PROTEINUR 100 (A) 08/11/2023 1250   NITRITE NEGATIVE 08/11/2023 1250   LEUKOCYTESUR NEGATIVE 08/11/2023 1250   Sepsis Labs: @LABRCNTIP (procalcitonin:4,lacticidven:4) )No results found for this or any previous visit (from the past 240 hours).   Scheduled Meds:  apixaban  5 mg Oral BID   atorvastatin  40 mg Oral Daily   budesonide (PULMICORT) nebulizer solution  0.5 mg Nebulization BID   carvedilol  6.25 mg Oral BID WC   [START ON 10/14/2023] furosemide  40 mg Oral Daily   insulin aspart  0-5 Units Subcutaneous QHS   insulin aspart  0-9 Units Subcutaneous TID WC   ipratropium-albuterol  3 mL Nebulization TID   melatonin  6 mg Oral QHS   sacubitril-valsartan  1 tablet Oral  BID   spironolactone  25 mg Oral Daily   Continuous Infusions:  Procedures/Studies: ECHOCARDIOGRAM COMPLETE Result Date: 10/11/2023    ECHOCARDIOGRAM REPORT   Patient Name:   James [name] Date of Exam: 10/11/2023 Medical Rec #:  [id number]         Height:       78.0 in Accession #:    [id number]        Weight:       255.3 lb Date of Birth:  November 28, 1960         BSA:          2.504 m Patient Age:    62 years          BP:           114/88 mmHg Patient Gender: M                 HR:  72 bpm. [facility] Procedure: 2D Echo, Cardiac Doppler, Color Doppler and Intracardiac            Opacification Agent Indications:    CHF  History:        Patient has prior history of Echocardiogram examinations, most                 recent 12/21/2022. CHF and Cardiomyopathy; Arrythmias:Atrial                 Fibrillation.  Sonographer:    Webb Laws Referring Phys: [id number] Tyrone Nine IMPRESSIONS  1. Left ventricular ejection fraction, by estimation, is 20%. The left ventricle has severely decreased function. The left ventricle demonstrates global hypokinesis. The left ventricular internal cavity size was severely dilated. Left ventricular diastolic parameters are indeterminate. Elevated left atrial pressure.  2. Right ventricular systolic function is moderately reduced. The right ventricular size is mildly enlarged.  3. Left atrial size was severely dilated.  4. Right atrial size was severely dilated.  5. The mitral valve is normal in structure. Mild mitral valve regurgitation.  6. The aortic valve is tricuspid. Aortic valve regurgitation is not visualized.  7. Aortic root normal for BSA . Aortic dilatation noted.  8. The inferior vena cava is dilated in size with <50% respiratory variability, suggesting right atrial pressure of 15 mmHg. FINDINGS  Left Ventricle: Left ventricular ejection fraction, by estimation, is 20%. The left ventricle has severely decreased function. The left ventricle  demonstrates global hypokinesis. Definity contrast agent was given IV to delineate the left ventricular endocardial borders. The left ventricular internal cavity size was severely dilated. There is no left ventricular hypertrophy. Left ventricular diastolic parameters are indeterminate. Elevated left atrial pressure. Right Ventricle: The right ventricular size is mildly enlarged. Right vetricular wall thickness was not assessed. Right ventricular systolic function is moderately reduced. Left Atrium: Left atrial size was severely dilated. Right Atrium: Right atrial size was severely dilated. Pericardium: There is no evidence of pericardial effusion. Mitral Valve: The mitral valve is normal in structure. Mild mitral valve regurgitation. Tricuspid Valve: The tricuspid valve is normal in structure. Tricuspid valve regurgitation is mild. Aortic Valve: The aortic valve is tricuspid. Aortic valve regurgitation is not visualized. Pulmonic Valve: The pulmonic valve was normal in structure. Pulmonic valve regurgitation is not visualized. Aorta: Aortic root normal for BSA. Aortic dilatation noted and the aortic root is normal in size and structure. Venous: The inferior vena cava is dilated in size with less than 50% respiratory variability, suggesting right atrial pressure of 15 mmHg. IAS/Shunts: No atrial level shunt detected by color flow Doppler. Additional Comments: A device lead is visualized.  LEFT VENTRICLE PLAX 2D LVIDd:         6.50 cm      Diastology LVIDs:         5.80 cm      LV e' medial:   2.10 cm/s LV PW:         1.00 cm      LV E/e' medial: 39.7 LV IVS:        1.10 cm  LV Volumes (MOD) LV vol d, MOD A2C: 303.0 ml LV vol d, MOD A4C: 326.0 ml LV vol s, MOD A2C: 256.0 ml LV vol s, MOD A4C: 247.0 ml LV SV MOD A2C:     47.0 ml LV SV MOD A4C:     326.0 ml LV SV MOD BP:      60.4 ml  RIGHT VENTRICLE            IVC RV Basal diam:  4.40 cm    IVC diam: 3.20 cm RV Mid diam:    3.60 cm RV S prime:     9.98 cm/s TAPSE  (M-mode): 2.1 cm LEFT ATRIUM              Index        RIGHT ATRIUM           Index LA diam:        6.00 cm  2.40 cm/m   RA Area:     29.20 cm LA Vol (A2C):   146.0 ml 58.30 ml/m  RA Volume:   104.00 ml 41.53 ml/m LA Vol (A4C):   127.0 ml 50.71 ml/m LA Biplane Vol: 139.0 ml 55.50 ml/m  AORTIC VALVE LVOT Vmax:   71.70 cm/s LVOT Vmean:  45.400 cm/s LVOT VTI:    0.108 m  AORTA Ao Root diam: 4.30 cm MITRAL VALVE               TRICUSPID VALVE MV Area (PHT): 6.71 cm    TR Peak grad:   38.9 mmHg MV Decel Time: 113 msec    TR Vmax:        312.00 cm/s MV E velocity: 83.40 cm/s MV A velocity: 24.30 cm/s  SHUNTS MV E/A ratio:  3.43        Systemic VTI: 0.11 m Dietrich Pates MD Electronically signed by Dietrich Pates MD Signature Date/Time: 10/11/2023/2:25:26 PM    Final    DG Chest 2 View Result Date: 10/09/2023 CLINICAL DATA:  Productive cough and shortness of for 1 week. Lower extremity edema EXAM: CHEST - 2 VIEW COMPARISON:  09/25/2023 FINDINGS: Stable mild cardiomegaly and chronic pulmonary vascular congestion. No No evidence of acute infiltrate or edema. No evidence of pleural effusion. Permanent single lead pacemaker remains in place IMPRESSION: Stable mild cardiomegaly and chronic pulmonary vascular congestion. No active disease. Electronically Signed   By: Danae Orleans M.D.   On: 10/09/2023 17:20   DG Chest 2 View Result Date: 09/30/2023 CLINICAL DATA:  62 year old male with shortness of breath EXAM: CHEST - 2 VIEW COMPARISON:  07/22/2022 FINDINGS: Cardiomediastinal silhouette unchanged in size and contour. No evidence of central vascular congestion. No interlobular septal thickening. Left chest wall pacing device/AICD with single lead. No pneumothorax or pleural effusion. Coarsened interstitial markings, with no confluent airspace disease. No acute displaced fracture. Degenerative changes of the spine. IMPRESSION: Negative for acute cardiopulmonary disease Electronically Signed   By: Gilmer Mor D.O.   On:  09/30/2023 12:52    Catarina Hartshorn, DO  [facility]  If 7PM-7AM, please contact night-coverage [email or web address] Password [email or web address] 10/13/2023, 6:12 PM   LOS: 4 days

## 2023-10-14 ENCOUNTER — Telehealth: Payer: Self-pay

## 2023-10-14 DIAGNOSIS — I480 Paroxysmal atrial fibrillation: Secondary | ICD-10-CM | POA: Diagnosis not present

## 2023-10-14 DIAGNOSIS — I5043 Acute on chronic combined systolic (congestive) and diastolic (congestive) heart failure: Secondary | ICD-10-CM | POA: Diagnosis not present

## 2023-10-14 DIAGNOSIS — I5022 Chronic systolic (congestive) heart failure: Secondary | ICD-10-CM

## 2023-10-14 DIAGNOSIS — N1831 Chronic kidney disease, stage 3a: Secondary | ICD-10-CM | POA: Diagnosis not present

## 2023-10-14 LAB — BASIC METABOLIC PANEL
Anion gap: 8 (ref 5–15)
BUN: 31 mg/dL — ABNORMAL HIGH (ref 8–23)
CO2: 27 mmol/L (ref 22–32)
Calcium: 8.3 mg/dL — ABNORMAL LOW (ref 8.9–10.3)
Chloride: 101 mmol/L (ref 98–111)
Creatinine, Ser: 1.47 mg/dL — ABNORMAL HIGH (ref 0.61–1.24)
GFR, Estimated: 54 mL/min — ABNORMAL LOW (ref >60)
Glucose, Bld: 110 mg/dL — ABNORMAL HIGH (ref 70–99)
Potassium: 4 mmol/L (ref 3.5–5.1)
Sodium: 136 mmol/L (ref 135–145)

## 2023-10-14 LAB — MAGNESIUM: Magnesium: 2.5 mg/dL — ABNORMAL HIGH (ref 1.7–2.4)

## 2023-10-14 LAB — GLUCOSE, CAPILLARY: Glucose-Capillary: 102 mg/dL — ABNORMAL HIGH (ref 70–99)

## 2023-10-14 MED ORDER — CARVEDILOL 6.25 MG PO TABS: 6.2500 mg | ORAL_TABLET | Freq: Two times a day (BID) | ORAL | 1 refills | Status: DC

## 2023-10-14 MED ORDER — IPRATROPIUM-ALBUTEROL 0.5-2.5 (3) MG/3ML IN SOLN: 3.0000 mL | Freq: Two times a day (BID) | RESPIRATORY_TRACT | Status: DC

## 2023-10-14 MED ORDER — TORSEMIDE 40 MG PO TABS: 40.0000 mg | ORAL_TABLET | Freq: Every day | ORAL | 1 refills | Status: DC

## 2023-10-14 MED ORDER — TORSEMIDE 20 MG PO TABS: 40.0000 mg | ORAL_TABLET | Freq: Every day | ORAL | Status: DC

## 2023-10-14 NOTE — Telephone Encounter (Signed)
Labs entered for 2 weeks. CHF referral placed per providers request. Furosemide removed from pt's chart and replaced with Demadex.

## 2023-10-14 NOTE — Telephone Encounter (Signed)
-----   Message from Charlton Haws sent at 10/14/2023  8:33 AM EST ----- Needs post hospital f/u for CHF with CHF clinic BMET/BNP in 2 weeks. Lasix changed to demedex

## 2023-10-14 NOTE — Plan of Care (Signed)
  Problem: Education: Goal: Knowledge of General Education information will improve Description: Including pain rating scale, medication(s)/side effects and non-pharmacologic comfort measures Outcome: Adequate for Discharge   Problem: Health Behavior/Discharge Planning: Goal: Ability to manage health-related needs will improve Outcome: Adequate for Discharge   Problem: Clinical Measurements: Goal: Ability to maintain clinical measurements within normal limits will improve Outcome: Adequate for Discharge Goal: Will remain free from infection Outcome: Adequate for Discharge Goal: Diagnostic test results will improve Outcome: Adequate for Discharge Goal: Respiratory complications will improve Outcome: Adequate for Discharge Goal: Cardiovascular complication will be avoided Outcome: Adequate for Discharge   Problem: Activity: Goal: Risk for activity intolerance will decrease Outcome: Adequate for Discharge   Problem: Nutrition: Goal: Adequate nutrition will be maintained Outcome: Adequate for Discharge   Problem: Coping: Goal: Level of anxiety will decrease Outcome: Adequate for Discharge   Problem: Elimination: Goal: Will not experience complications related to bowel motility Outcome: Adequate for Discharge Goal: Will not experience complications related to urinary retention Outcome: Adequate for Discharge   Problem: Pain Management: Goal: General experience of comfort will improve Outcome: Adequate for Discharge   Problem: Safety: Goal: Ability to remain free from injury will improve Outcome: Adequate for Discharge   Problem: Skin Integrity: Goal: Risk for impaired skin integrity will decrease Outcome: Adequate for Discharge   Problem: Education: Goal: Ability to demonstrate management of disease process will improve Outcome: Adequate for Discharge Goal: Ability to verbalize understanding of medication therapies will improve Outcome: Adequate for Discharge Goal:  Individualized Educational Video(s) Outcome: Adequate for Discharge   Problem: Activity: Goal: Capacity to carry out activities will improve Outcome: Adequate for Discharge   Problem: Cardiac: Goal: Ability to achieve and maintain adequate cardiopulmonary perfusion will improve Outcome: Adequate for Discharge   Problem: Education: Goal: Ability to describe self-care measures that may prevent or decrease complications (Diabetes Survival Skills Education) will improve Outcome: Adequate for Discharge Goal: Individualized Educational Video(s) Outcome: Adequate for Discharge   Problem: Coping: Goal: Ability to adjust to condition or change in health will improve Outcome: Adequate for Discharge   Problem: Fluid Volume: Goal: Ability to maintain a balanced intake and output will improve Outcome: Adequate for Discharge   Problem: Health Behavior/Discharge Planning: Goal: Ability to identify and utilize available resources and services will improve Outcome: Adequate for Discharge Goal: Ability to manage health-related needs will improve Outcome: Adequate for Discharge   Problem: Nutritional: Goal: Maintenance of adequate nutrition will improve Outcome: Adequate for Discharge Goal: Progress toward achieving an optimal weight will improve Outcome: Adequate for Discharge   Problem: Skin Integrity: Goal: Risk for impaired skin integrity will decrease Outcome: Adequate for Discharge   Problem: Tissue Perfusion: Goal: Adequacy of tissue perfusion will improve Outcome: Adequate for Discharge

## 2023-10-14 NOTE — Addendum Note (Signed)
Addended by: Roseanne Reno on: 10/14/2023 08:38 AM   Modules accepted: Orders

## 2023-10-14 NOTE — Progress Notes (Signed)
   Cardiologist:  Sabharwal  Subjective:  Denies SSCP, palpitations or Dyspnea Feels he is at his baseline  Objective:  Vitals:   10/13/23 1943 10/13/23 2045 10/14/23 0428 10/14/23 0714  BP:  97/73 110/87   Pulse:  64 62   Resp:  18    Temp:  98.1 F (36.7 C) 97.7 F (36.5 C)   TempSrc:  Oral Oral   SpO2: 96% 95% 98% 97%  Weight:   110.2 kg   Height:        Intake/Output from previous day:  Intake/Output Summary (Last 24 hours) at 10/14/2023 0826 Last data filed at 10/14/2023 0450 Gross per 24 hour  Intake 900 ml  Output 3150 ml  Net -2250 ml    Physical Exam:  Black male no distress Laying flat No JVP elevation No murmur PMI increased Lungs clear Abdomen benign No edema  Lab Results: Basic Metabolic Panel: Recent Labs    10/12/23 0518 10/13/23 0435 10/14/23 0444  NA 136 138 136  K 3.7 3.5 4.0  CL 99 100 101  CO2 30 28 27   GLUCOSE 159* 111* 110*  BUN 27* 29* 31*  CREATININE 1.50* 1.61* 1.47*  CALCIUM 8.5* 8.4* 8.3*  MG 2.3  --  2.5*     Imaging: No results found.  Cardiac Studies:  ECG: SR LAD ICLBBB   Telemetry:  NSR  Echo: EF 20%moderate RV reduction severe bi atrial enlargement mild MR  Medications:    apixaban  5 mg Oral BID   atorvastatin  40 mg Oral Daily   budesonide (PULMICORT) nebulizer solution  0.5 mg Nebulization BID   carvedilol  6.25 mg Oral BID WC   furosemide  40 mg Oral Daily   insulin aspart  0-5 Units Subcutaneous QHS   insulin aspart  0-9 Units Subcutaneous TID WC   ipratropium-albuterol  3 mL Nebulization TID   melatonin  6 mg Oral QHS   sacubitril-valsartan  1 tablet Oral BID   spironolactone  25 mg Oral Daily      Assessment/Plan:   CHF:  EF < 20% with BiV AICD. Has had good hospital diuresis back to baseline with K 4.0 and Cr 1.47. Looks like home lasix was only 20 mg every other day. With aldactone Will d/c with demedex 40 mg daily and his aldactone Continue core and entresto. Will arrange outpatient f/u  with CHF clinic in 2 weeks with BMET/BNP PAF:  in NSR continue eliquis and coreg AICD:  normal function f/u Sonnie Alamo [facility] 10/14/2023, 8:26 AM

## 2023-10-14 NOTE — Discharge Summary (Signed)
Physician Discharge Summary   Patient: James [name] MRN: [id number] DOB: 25-Nov-1960  Admit date:     10/09/2023  Discharge date: 10/14/23  Discharge Physician: Onalee Hua Nylia Gavina   PCP: Billie Lade, MD   Recommendations at discharge:   Please follow up with primary care provider within 1-2 weeks  Please repeat BMP and CBC in one week     Hospital Course: 62 year old male with a history of HFrEF (20-25%), paroxysmal atrial fibrillation, hypertension, CKD stage III, diabetes mellitus type 2, pulm hypertension, COPD, tobacco abuse in remission presenting with dyspnea on exertion and lower extremity edema for about a month.  He feels that he has had about a 14 pound weight gain.  He denies any chest pain, fevers, chills, nausea, vomiting, diarrhea.  He reports that his PCP tried to start the patient on torsemide prior to his admission.  However, the patient states that he was not able to obtain it from the pharmacy.  He endorses compliance with his previous diuretic regimen as well as fluid and salt intake. He has a Biotronik ICD in place with follow-up by Dr. Ladona Ridgel. Last device check that I can see from August showed NSVT, no sustained arrhythmias or device therapies. He also has paroxysmal atrial fibrillation/flutter, no reported palpitations, not certain about rhythm frequency.  In the ED, the patient was afebrile hemodynamically stable with oxygen saturation 97% on room air.  WBC 8.1, hemoglobin 16.9, platelets 183.  Sodium 137, potassium 2.5, bicarbonate 27, serum creatinine 1.59.  BNP 4142.  Troponin 36>> 41.  Chest x-ray showed pulmonary vascular congestion.  The patient was started on IV furosemide.  Cardiology was consulted to assist with management.  Assessment and Plan: Expand All Collapse All                                                                               PROGRESS NOTE   James [name] JYN:[id number] DOB: Dec 08, 1960 DOA: 10/09/2023 PCP: Billie Lade, MD    Brief History:  62 year old male with a history of HFrEF (20-25%), paroxysmal atrial fibrillation, hypertension, CKD stage III, diabetes mellitus type 2, pulm hypertension, COPD, tobacco abuse in remission presenting with dyspnea on exertion and lower extremity edema for about a month.  He feels that he has had about a 14 pound weight gain.  He denies any chest pain, fevers, chills, nausea, vomiting, diarrhea.  He reports that his PCP tried to start the patient on torsemide prior to his admission.  However, the patient states that he was not able to obtain it from the pharmacy.  He endorses compliance with his previous diuretic regimen as well as fluid and salt intake. He has a Bi[employer] ICD in place with follow-up by Dr. Ladona Ridgel. Last device check that I can see from August showed NSVT, no sustained arrhythmias or device therapies. He also has paroxysmal atrial fibrillation/flutter, no reported palpitations, not certain about rhythm frequency.  In the ED, the patient was afebrile hemodynamically stable with oxygen saturation 97% on room air.  WBC 8.1, hemoglobin 16.9, platelets 183.  Sodium 137, potassium 2.5, bicarbonate 27, serum creatinine 1.59.  BNP 4142.  Troponin 36>> 41.  Chest x-ray showed pulmonary vascular congestion.  The patient was started on IV furosemide.  Cardiology was consulted to assist with management.    Assessment/Plan:   Acute on chronic HFrEF -12/21/2022 echo EF 20-25%, mild decreased RVF, trivial MR/TR -Appreciate cardiology consult -Continue IV furosemide twice daily>>po lasix -12/28 ReDS = 34 -neg 14L -neg 23 lbs--d/c weight 243 -Continue Coreg, Entresto, spironolactone   Paroxysmal atrial fibrillation -Continue apixaban and carvedilol   CKD stage IIIa -Baseline creatinine 1.3-1.6 -serum creatinine 1.47 on day of dc   Diabetes mellitus type 2, controlled -10/09/2023 hemoglobin A1c 7.2 -Holding metformin and Farxiga>>resume after d/c -NovoLog sliding scale    Essential hypertension -Continue Entresto, carvedilol, spironolactone -BP controlled   Hyperlipidemia -Continue statin   COPD -Started bronchodilators -Patient stopped smoking about 1 month ago after 30-pack-year history               Consultants: cardiology Procedures performed: none  Disposition: Home Diet recommendation:  Cardiac and Carb modified diet DISCHARGE MEDICATION: Allergies as of 10/14/2023   No Known Allergies      Medication List     TAKE these medications    acetaminophen 500 MG tablet Commonly known as: TYLENOL Take 1,000 mg by mouth every 6 (six) hours as needed for moderate pain.   albuterol 108 (90 Base) MCG/ACT inhaler Commonly known as: VENTOLIN HFA INHALE 2 PUFFS BY MOUTH FOUR TIMES DAILY AS NEEDED What changed: See the new instructions.   apixaban 5 MG Tabs tablet Commonly known as: Eliquis Take 1 tablet (5 mg total) by mouth 2 (two) times daily.   atorvastatin 40 MG tablet Commonly known as: LIPITOR Take 1 tablet (40 mg total) by mouth daily.   carvedilol 6.25 MG tablet Commonly known as: COREG Take 1 tablet (6.25 mg total) by mouth 2 (two) times daily with a meal. What changed:  medication strength See the new instructions.   dapagliflozin propanediol 10 MG Tabs tablet Commonly known as: Farxiga Take 1 tablet (10 mg total) by mouth daily before breakfast.   Entresto 97-103 MG Generic drug: sacubitril-valsartan Take 1 tablet by mouth 2 (two) times daily.   meclizine 25 MG tablet Commonly known as: ANTIVERT Take 1 tablet (25 mg total) by mouth 3 (three) times daily as needed for dizziness.   metFORMIN 500 MG tablet Commonly known as: GLUCOPHAGE TAKE 1 TABLET BY MOUTH TWICE DAILY   promethazine-dextromethorphan 6.25-15 MG/5ML syrup Commonly known as: PROMETHAZINE-DM Take 5 mLs by mouth 4 (four) times daily as needed. What changed: reasons to take this   spironolactone 25 MG tablet Commonly known as:  ALDACTONE TAKE 1 TABLET(25 MG) BY MOUTH DAILY   Torsemide 40 MG Tabs Take 40 mg by mouth daily. Start taking on: October 15, 2023 What changed:  medication strength how much to take        Discharge Exam: Filed Weights   10/12/23 0412 10/13/23 0500 10/14/23 0428  Weight: 113.5 kg 111.7 kg 110.2 kg   HEENT:  [place]/AT, No thrush, no icterus CV:  RRR, no rub, no S3, no S4 Lung:  CTA, no wheeze, no rhonchi Abd:  soft/+BS, NT Ext:  No edema, no lymphangitis, no synovitis, no rash   Condition at discharge: stable  The results of significant diagnostics from this hospitalization (including imaging, microbiology, ancillary and laboratory) are listed below for reference.   Imaging Studies: ECHOCARDIOGRAM COMPLETE Result Date: 10/11/2023    ECHOCARDIOGRAM REPORT   Patient Name:   James [name] Date of Exam: 10/11/2023 Medical Rec #:  [id number]  Height:       78.0 in Accession #:    [id number]        Weight:       255.3 lb Date of Birth:  02-Aug-1961         BSA:          2.504 m Patient Age:    62 years          BP:           114/88 mmHg Patient Gender: M                 HR:           72 bpm. [facility] Procedure: 2D Echo, Cardiac Doppler, Color Doppler and Intracardiac            Opacification Agent Indications:    CHF  History:        Patient has prior history of Echocardiogram examinations, most                 recent 12/21/2022. CHF and Cardiomyopathy; Arrythmias:Atrial                 Fibrillation.  Sonographer:    Webb Laws Referring Phys: [id number] Tyrone Nine IMPRESSIONS  1. Left ventricular ejection fraction, by estimation, is 20%. The left ventricle has severely decreased function. The left ventricle demonstrates global hypokinesis. The left ventricular internal cavity size was severely dilated. Left ventricular diastolic parameters are indeterminate. Elevated left atrial pressure.  2. Right ventricular systolic function is moderately reduced. The right  ventricular size is mildly enlarged.  3. Left atrial size was severely dilated.  4. Right atrial size was severely dilated.  5. The mitral valve is normal in structure. Mild mitral valve regurgitation.  6. The aortic valve is tricuspid. Aortic valve regurgitation is not visualized.  7. Aortic root normal for BSA . Aortic dilatation noted.  8. The inferior vena cava is dilated in size with <50% respiratory variability, suggesting right atrial pressure of 15 mmHg. FINDINGS  Left Ventricle: Left ventricular ejection fraction, by estimation, is 20%. The left ventricle has severely decreased function. The left ventricle demonstrates global hypokinesis. Definity contrast agent was given IV to delineate the left ventricular endocardial borders. The left ventricular internal cavity size was severely dilated. There is no left ventricular hypertrophy. Left ventricular diastolic parameters are indeterminate. Elevated left atrial pressure. Right Ventricle: The right ventricular size is mildly enlarged. Right vetricular wall thickness was not assessed. Right ventricular systolic function is moderately reduced. Left Atrium: Left atrial size was severely dilated. Right Atrium: Right atrial size was severely dilated. Pericardium: There is no evidence of pericardial effusion. Mitral Valve: The mitral valve is normal in structure. Mild mitral valve regurgitation. Tricuspid Valve: The tricuspid valve is normal in structure. Tricuspid valve regurgitation is mild. Aortic Valve: The aortic valve is tricuspid. Aortic valve regurgitation is not visualized. Pulmonic Valve: The pulmonic valve was normal in structure. Pulmonic valve regurgitation is not visualized. Aorta: Aortic root normal for BSA. Aortic dilatation noted and the aortic root is normal in size and structure. Venous: The inferior vena cava is dilated in size with less than 50% respiratory variability, suggesting right atrial pressure of 15 mmHg. IAS/Shunts: No atrial level  shunt detected by color flow Doppler. Additional Comments: A device lead is visualized.  LEFT VENTRICLE PLAX 2D LVIDd:         6.50 cm      Diastology LVIDs:  5.80 cm      LV e' medial:   2.10 cm/s LV PW:         1.00 cm      LV E/e' medial: 39.7 LV IVS:        1.10 cm  LV Volumes (MOD) LV vol d, MOD A2C: 303.0 ml LV vol d, MOD A4C: 326.0 ml LV vol s, MOD A2C: 256.0 ml LV vol s, MOD A4C: 247.0 ml LV SV MOD A2C:     47.0 ml LV SV MOD A4C:     326.0 ml LV SV MOD BP:      60.4 ml RIGHT VENTRICLE            IVC RV Basal diam:  4.40 cm    IVC diam: 3.20 cm RV Mid diam:    3.60 cm RV S prime:     9.98 cm/s TAPSE (M-mode): 2.1 cm LEFT ATRIUM              Index        RIGHT ATRIUM           Index LA diam:        6.00 cm  2.40 cm/m   RA Area:     29.20 cm LA Vol (A2C):   146.0 ml 58.30 ml/m  RA Volume:   104.00 ml 41.53 ml/m LA Vol (A4C):   127.0 ml 50.71 ml/m LA Biplane Vol: 139.0 ml 55.50 ml/m  AORTIC VALVE LVOT Vmax:   71.70 cm/s LVOT Vmean:  45.400 cm/s LVOT VTI:    0.108 m  AORTA Ao Root diam: 4.30 cm MITRAL VALVE               TRICUSPID VALVE MV Area (PHT): 6.71 cm    TR Peak grad:   38.9 mmHg MV Decel Time: 113 msec    TR Vmax:        312.00 cm/s MV E velocity: 83.40 cm/s MV A velocity: 24.30 cm/s  SHUNTS MV E/A ratio:  3.43        Systemic VTI: 0.11 m Dietrich Pates MD Electronically signed by Dietrich Pates MD Signature Date/Time: 10/11/2023/2:25:26 PM    Final    DG Chest 2 View Result Date: 10/09/2023 CLINICAL DATA:  Productive cough and shortness of for 1 week. Lower extremity edema EXAM: CHEST - 2 VIEW COMPARISON:  09/25/2023 FINDINGS: Stable mild cardiomegaly and chronic pulmonary vascular congestion. No No evidence of acute infiltrate or edema. No evidence of pleural effusion. Permanent single lead pacemaker remains in place IMPRESSION: Stable mild cardiomegaly and chronic pulmonary vascular congestion. No active disease. Electronically Signed   By: Danae Orleans M.D.   On: 10/09/2023 17:20   DG  Chest 2 View Result Date: 09/30/2023 CLINICAL DATA:  62 year old male with shortness of breath EXAM: CHEST - 2 VIEW COMPARISON:  07/22/2022 FINDINGS: Cardiomediastinal silhouette unchanged in size and contour. No evidence of central vascular congestion. No interlobular septal thickening. Left chest wall pacing device/AICD with single lead. No pneumothorax or pleural effusion. Coarsened interstitial markings, with no confluent airspace disease. No acute displaced fracture. Degenerative changes of the spine. IMPRESSION: Negative for acute cardiopulmonary disease Electronically Signed   By: Gilmer Mor D.O.   On: 09/30/2023 12:52    Microbiology: Results for orders placed or performed during the hospital encounter of 07/08/23  SARS CORONAVIRUS 2 (Masyn Fullam 6-24 HRS) Anterior Nasal Swab     Status: Abnormal   Collection Time: 07/08/23  1:46 PM  Specimen: Anterior Nasal Swab  Result Value Ref Range Status   SARS Coronavirus 2 POSITIVE (A) NEGATIVE Final    Comment: (NOTE) SARS-CoV-2 target nucleic acids are DETECTED.  The SARS-CoV-2 RNA is generally detectable in upper and lower respiratory specimens during the acute phase of infection. Positive results are indicative of the presence of SARS-CoV-2 RNA. Clinical correlation with patient history and other diagnostic information is  necessary to determine patient infection status. Positive results do not rule out bacterial infection or co-infection with other viruses.  The expected result is Negative.  Fact Sheet for Patients: HairSlick.no  Fact Sheet for Healthcare Providers: quierodirigir.com  This test is not yet approved or cleared by the [place] FDA and  has been authorized for detection and/or diagnosis of SARS-CoV-2 by FDA under an Emergency Use Authorization (EUA). This EUA will remain  in effect (meaning this test can be used) for the duration of the COVID-19 declaration under  Section 564(b)(1) of the Act, 21 U. S.C. section 360bbb-3(b)(1), unless the authorization is terminated or revoked sooner.   Performed at [facility], [address], [place], [place] [address]     Labs: CBC: Recent Labs  Lab 10/09/23 1713  WBC 8.1  HGB 16.9  HCT 50.9  MCV 98.8  PLT 183   Basic Metabolic Panel: Recent Labs  Lab 10/10/23 0454 10/11/23 0418 10/12/23 0518 10/13/23 0435 10/14/23 0444  NA 139 137 136 138 136  K 3.3* 3.5 3.7 3.5 4.0  CL 103 101 99 100 101  CO2 28 27 30 28 27   GLUCOSE 135* 102* 159* 111* 110*  BUN 29* 27* 27* 29* 31*  CREATININE 1.64* 1.62* 1.50* 1.61* 1.47*  CALCIUM 8.1* 8.1* 8.5* 8.4* 8.3*  MG  --   --  2.3  --  2.5*   Liver Function Tests: No results for input(s): "AST", "ALT", "ALKPHOS", "BILITOT", "PROT", "ALBUMIN" in the last 168 hours. CBG: Recent Labs  Lab 10/13/23 0748 10/13/23 1137 10/13/23 1629 10/13/23 2155 10/14/23 0737  GLUCAP 124* 158* 104* 120* 102*    Discharge time spent: greater than 30 minutes.  Signed: Catarina Hartshorn, MD [facility] 10/14/2023

## 2023-10-15 ENCOUNTER — Other Ambulatory Visit: Payer: Self-pay | Admitting: Internal Medicine

## 2023-10-15 ENCOUNTER — Telehealth (HOSPITAL_COMMUNITY): Payer: Self-pay

## 2023-10-15 ENCOUNTER — Telehealth: Payer: Self-pay | Admitting: Pharmacist

## 2023-10-15 ENCOUNTER — Telehealth: Payer: Self-pay

## 2023-10-15 DIAGNOSIS — I5020 Unspecified systolic (congestive) heart failure: Secondary | ICD-10-CM

## 2023-10-15 MED ORDER — TORSEMIDE 20 MG PO TABS: 40.0000 mg | ORAL_TABLET | Freq: Every day | ORAL | 2 refills | Status: DC

## 2023-10-15 NOTE — Telephone Encounter (Signed)
 Message from ASSURANT RN covering [employer] not filling torsemide  and saying it's for branded drug Torsemide  was called in as generic  Call placed to pharmacy ([facility] [place], [place]) 20mg  tabs available as generic Message sent to PCP to call in torsemide  20mg  tabs #60

## 2023-10-15 NOTE — Transitions of Care (Post Inpatient/ED Visit) (Signed)
   10/15/2023  Name: James [name] MRN: [id number] DOB: 10/02/1961  Today's TOC FU Call Status: Today's TOC FU Call Status:: Successful TOC FU Call Completed TOC FU Call Complete Date: 10/15/23 Patient's Name and Date of Birth confirmed.  Transition Care Management Follow-up Telephone Call Date of Discharge: 10/14/23 [facility] Type of Discharge: Inpatient Admission Primary Inpatient Discharge Diagnosis:: CHF How have you been since you were released from the hospital?: Better Any questions or concerns?: No  Items Reviewed: Did you receive and understand the discharge instructions provided?: Yes Medications obtained,verified, and reconciled?: Yes (Medications Reviewed) Any new allergies since your discharge?: No Dietary orders reviewed?: Yes Type of Diet Ordered:: low salt diet Do you have support at home?: Yes People in Home: spouse  Medications Reviewed Today: Medications Reviewed Today   Medications were not reviewed in this encounter     Home Care and Equipment/Supplies: Were Home Health Services Ordered?: No Any new equipment or medical supplies ordered?: No  Functional Questionnaire: Do you need assistance with bathing/showering or dressing?: No Do you need assistance with meal preparation?: No Do you need assistance with eating?: No Do you have difficulty maintaining continence: No Do you need assistance with getting out of bed/getting out of a chair/moving?: No Do you have difficulty managing or taking your medications?: No  Follow up appointments reviewed: PCP Follow-up appointment confirmed?: Yes Date of PCP follow-up appointment?: 10/28/23 Follow-up Provider: Dr. Melvenia Specialist [facility] Follow-up appointment confirmed?: Yes Date of Specialist follow-up appointment?: 11/01/23 Follow-Up Specialty Provider:: Dr. Waddell Do you need transportation to your follow-up appointment?: No Do you understand care options if your  condition(s) worsen?: Yes-patient verbalized understanding  SDOH Interventions Today    Flowsheet Row Most Recent Value  SDOH Interventions   Food Insecurity Interventions Intervention Not Indicated  Housing Interventions Intervention Not Indicated  Transportation Interventions Intervention Not Indicated  Utilities Interventions Intervention Not Indicated      Patient reports that he is not able to get his RX for torsemide  at the pharmacy and he does not understand why.  I placed call to [facility] pharmacy [phone].    Patient reports that he is self managing well. Reports that he understands his diet and importance of taking his medications. Reports that he was able to weigh today and understands the importance of daily weights. Reviewed heart failure zones and when to call MD. Reviewed pending appointments. Spoke with Mliss Griffin pharmacist with [facility] who has gotten RX fixed for patient.  Patient was informed to pick up medication in an hour and take as directed. I reviewed with patient how to take medication. Reviewed again to weigh daily.   Reviewed and offered patient 30 day TOC program and patient declined at this time. Provided my contact information for patient to call me if needed in the future.   Alan Ee, RN, BSN, CEN [employer]- Transition of Care Team.  [facility] [phone]

## 2023-10-15 NOTE — Telephone Encounter (Signed)
 Called and spoke with patient- got patient scheduled to see Dr. Gasper Lloyd on Jan 23rd at [facility]. Patient aware of appointment time and date. Advised patient to call back to office with any issues, questions, or concerns. Patient verbalized understanding.

## 2023-10-17 ENCOUNTER — Telehealth: Payer: Self-pay

## 2023-10-17 NOTE — Telephone Encounter (Signed)
 Copied from CRM [id number]. Topic: Clinical - Prescription Issue >> Oct 14, 2023 11:52 AM Elle L wrote: Reason for CRM: The patient needs torsemide  (DEMADEX ) tablet 40 mgto be sent in as the generic version at 20 mg twice a day per pharmacy as the patient's insurance will not cover the current script.   [id number] - [place], [place] - [address] AT [address]

## 2023-10-28 ENCOUNTER — Ambulatory Visit: Payer: Self-pay | Admitting: Internal Medicine

## 2023-11-03 DIAGNOSIS — G4733 Obstructive sleep apnea (adult) (pediatric): Secondary | ICD-10-CM | POA: Diagnosis not present

## 2023-11-04 ENCOUNTER — Other Ambulatory Visit (HOSPITAL_COMMUNITY): Payer: Self-pay | Admitting: Cardiology

## 2023-11-06 NOTE — Progress Notes (Signed)
ADVANCED HEART FAILURE CLINIC NOTE  Referring Physician: Billie Lade, MD  Primary Care: Billie Lade, MD Primary Cardiologist:  HPI: James [name] is a 63 y.o. male with nonischemic cardiomyopathy s/p ICD, HTNm, T2DM, aortic root aneurysm presenting today for follow-up James [name] HF dates back to 2011 when he was diagnosed with nonischemic cardiomyopathy.  According to James [name] he came to the [facility] system in 2013 when he had a left heart cath with no coronary disease with a EF of 20%, he was started on low-dose GDMT at that time.  By 2013 his EF improved to 45%.  He followed yearly with [name] admission until 2022 when he once again had a reduction in LVEF to 20 to 25% patient was started on Entresto With a repeat heart cath demonstrating no significant coronary disease.  For the past 1 year despite the addition of GDMT, patient has had progressively worsening exercise capacity with no improvement in LV function.  Referred to heart failure for further evaluation.  Since establishing care with [name], James [name] has undergone evaluation for advanced therapies.  His CPX is suggestive of severe heart failure limitation and multiple right heart catheterizations demonstrate persistently reduced cardiac index less than 2 L/min/m.  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  Interval hx:  - Admitted in 12/24 with HFrEF exacerbation; TTE at that time with LVEF 20%  Activity level/exercise tolerance: NYHA IIb Orthopnea:  Sleeps on 2 pillows Paroxysmal noctural dyspnea: No Chest pain/pressure: No Orthostatic lightheadedness: No Palpitations: No Lower extremity edema: No Presyncope/syncope: No Cough: No  Past Medical History:  Diagnosis Date   CKD (chronic kidney disease) stage 2, GFR 60-89 ml/min    COPD (chronic obstructive pulmonary disease) (HCC)    Diabetes mellitus type II,  non insulin dependent (HCC)    Dilated aortic root (HCC)    Hypercholesteremia    Hypertension    NICM (nonischemic cardiomyopathy) (HCC)    NSVT (nonsustained ventricular tachycardia) (HCC)    Obstructive sleep apnea    Paroxysmal atrial fibrillation (HCC)    Pulmonary hypertension (HCC)    PVC's (premature ventricular contractions)    Tobacco abuse     Current Outpatient Medications  Medication Sig Dispense Refill   acetaminophen (TYLENOL) 500 MG tablet Take 1,000 mg by mouth every 6 (six) hours as needed for moderate pain.     albuterol (VENTOLIN HFA) 108 (90 Base) MCG/ACT inhaler INHALE 2 PUFFS BY MOUTH FOUR TIMES DAILY AS NEEDED (Patient taking differently: Inhale 2 puffs into the lungs every 4 (four) hours as needed for wheezing or shortness of breath.) 6.7 g 1   apixaban (ELIQUIS) 5 MG TABS tablet Take 1 tablet (5 mg total) by mouth 2 (two) times daily. 60 tablet 11   atorvastatin (LIPITOR) 40 MG tablet Take 1 tablet (40 mg total) by mouth daily. 90 tablet 3   carvedilol (COREG) 6.25 MG tablet Take 1 tablet (6.25 mg total) by mouth 2 (two) times daily with a meal. 60 tablet 1   dapagliflozin propanediol (FARXIGA) 10 MG TABS tablet Take 1 tablet (10 mg total) by mouth daily before breakfast. 90 tablet 3   ENTRESTO 97-103 MG TAKE 1 TABLET BY MOUTH TWICE DAILY 60 tablet 11   meclizine (ANTIVERT) 25 MG tablet Take 1 tablet (25 mg total) by mouth 3 (three) times daily as needed for dizziness. (Patient not taking:  Reported on 10/15/2023) 30 tablet 0   metFORMIN (GLUCOPHAGE) 500 MG tablet TAKE 1 TABLET BY MOUTH TWICE DAILY 180 tablet 1   promethazine-dextromethorphan (PROMETHAZINE-DM) 6.25-15 MG/5ML syrup Take 5 mLs by mouth 4 (four) times daily as needed. (Patient not taking: Reported on 10/15/2023) 118 mL 0   spironolactone (ALDACTONE) 25 MG tablet TAKE 1 TABLET(25 MG) BY MOUTH DAILY 90 tablet 3   torsemide (DEMADEX) 20 MG tablet Take 2 tablets (40 mg total) by mouth daily. 60 tablet 2    No current facility-administered medications for this visit.   Facility-Administered Medications Ordered in Other Visits  Medication Dose Route Frequency Provider Last Rate Last Admin   sodium chloride flush (NS) 0.9 % injection 3 mL  3 mL Intravenous Q12H Pauline Pegues, DO        No Known Allergies    Social History   Socioeconomic History   Marital status: Married    Spouse name: James [name]   Number of children: 4   Years of education: Not on file   Highest education level: Associate degree: occupational, Scientist, product/process development, or vocational program  Occupational History   Occupation: Merchandiser, retail    Comment: [employer]  Tobacco Use   Smoking status: Former    Current packs/day: 0.00    Average packs/day: 0.5 packs/day for 38.8 years (19.4 ttl pk-yrs)    Types: Cigarettes    Start date: 10/15/1982    Quit date: 07/18/2021    Years since quitting: 2.3   Smokeless tobacco: Never   Tobacco comments:    3 cigaretts a day  Vaping Use   Vaping status: Never Used  Substance and Sexual Activity   Alcohol use: No    Alcohol/week: 0.0 standard drinks of alcohol   Drug use: No   Sexual activity: Yes    Birth control/protection: None  Other Topics Concern   Not on file  Social History Narrative   Divorced   No regular exercise   Social Drivers of Health   Financial Resource Strain: Low Risk  (07/21/2021)   Overall Financial Resource Strain (CARDIA)    Difficulty of Paying Living Expenses: Not very hard  Food Insecurity: No Food Insecurity (10/15/2023)   Hunger Vital Sign    Worried About Running Out of Food in the Last Year: Never true    Ran Out of Food in the Last Year: Never true  Transportation Needs: No Transportation Needs (10/15/2023)   PRAPARE - Administrator, Civil Service (Medical): No    Lack of Transportation (Non-Medical): No  Physical Activity: Not on file  Stress: Not on file  Social Connections: Not on file  Intimate Partner Violence: Not  At Risk (10/15/2023)   Humiliation, Afraid, Rape, and Kick questionnaire    Fear of Current or Ex-Partner: No    Emotionally Abused: No    Physically Abused: No    Sexually Abused: No      Family History  Problem Relation Age of Onset   Coronary artery disease Mother    Diabetes Father    Heart attack Father    Hypertension Father    Iron deficiency Father    Thyroid disease Sister    Cardiomyopathy Brother        No significant coronary disease by coronary angiography   Heart disease Maternal Grandfather        Also paternal grandfather   Sudden death Other    Colon cancer Neg Hx     PHYSICAL EXAM: There were  no vitals filed for this visit. GENERAL: Well nourished, well developed, and in no apparent distress at rest.  HEENT: There is no scleral icterus.  The mucous membranes are pink and moist.   CHEST: There are no chest wall deformities. There is no chest wall tenderness. Respirations are unlabored.  Lungs- *** CARDIAC:  JVP: *** cm          Normal rate with regular rhythm. *** murmur.  Pulses are 2+ and symmetrical in upper and lower extremities. *** edema.  ABDOMEN: Soft, non-tender, non-distended. There are normal bowel sounds.  EXTREMITIES: Warm and well perfused.  NEUROLOGIC: Patient is oriented x3 with no obvious focal neurologic deficits.  PSYCH: Patients affect is appropriate SKIN: Warm and dry; no lesions or wounds.     DATA REVIEW  ECG: Normal sinus rhythm  ECHO: 07/23/2022: LVEF 20 to 25%, severely dilated, RV function moderately reduced, LA severely dilated, mild MR. 10/22/22: LVEF 25-30% 12/21/22 (TEE): LVEF 20-25%, RV function mildly reduced. Aneurysmal aortic root at [facility].  10/11/23: LVEF 20%, moderately reduced RV function   CATH: RHC/LHC (07/19/21): No angiographic evidence of CAD Elevated right and left heart pressures (LV: 130/22/39, RA 15, RV 82/13/20, PA 83/30 mean 49, PCWP 35).   RHC: 12/21/22: HEMODYNAMICS: RA:                  8 mmHg  (mean) RV:                  83/7 mmHg PA:                  79/34 mmHg (51 mean) PCWP:            25 mmHg (mean)                                      Estimated Fick CO/CI   3.3 L/min, 1.3 L/min/m2         Thermodilution CO/CI   4.9 L/min, 1.9 L/min/m2 NIBP: 125/88                                      TPG                 26  mmHg                                            PVR                 5.3-8.8 Wood Units  PAPi                5.6       ASSESSMENT & PLAN:  NYHA IIb, stage C nonischemic cardiomyopathy Etiology of HF: Nonischemic cardiomyopathy likely idiopathic dilated however cannot rule out infiltrative disease.  Repeat echo at follow up.  NYHA class / AHA Stage: NYHA IIb Volume status & Diuretics: Euvolemic on exam; currently taking lasix 20mg  every day.  Vasodilators: Continue entresto to 97/103 Beta-Blocker: Coreg 12.5 twice daily MRA: Spironolactone 25 Cardiometabolic: Farxiga 10 mg daily Devices therapies & Valvulopathies: Primary prevention ICD in place Advanced therapies: Right heart catheterization and CPX consistent with stage D heart failure with severe heart failure limitations.  However, from a subjective standpoint he still feels fairly well with no PND, lower extremity edema and reports that he does not feel limited due to shortness of breath.  At this time we will follow him closely clinically if he does have any decompensation plan for advanced therapies.  He has completed most of his LVAD evaluation and has been discussed at [facility]. Currently doing very well functionally. Will hold off on advanced therapies.   2. CKD - repeat labs today.   3. Aortic root aneurysm - 5cm by echo in 10/23. Stable.   4. Paroxysmal atrial fibrillation/flutter - apixaban 5mg  BID  5. OSA - AHI 30.6 - Planning for inpatient sleep study within the next month.   Shandra Szymborski Advanced Heart Failure Mechanical Circulatory Support

## 2023-11-07 ENCOUNTER — Ambulatory Visit (HOSPITAL_COMMUNITY)
Admission: RE | Admit: 2023-11-07 | Discharge: 2023-11-07 | Disposition: A | Discharge status: Home / Self Care | Payer: BC Managed Care – PPO | Source: Ambulatory Visit | Attending: Cardiology | Admitting: Cardiology

## 2023-11-07 ENCOUNTER — Telehealth: Payer: Self-pay | Admitting: Internal Medicine

## 2023-11-07 ENCOUNTER — Encounter (HOSPITAL_COMMUNITY): Payer: Self-pay | Admitting: Cardiology

## 2023-11-07 ENCOUNTER — Other Ambulatory Visit (HOSPITAL_COMMUNITY): Payer: Self-pay

## 2023-11-07 VITALS — BP 104/70 | HR 69 | Wt 244.6 lb

## 2023-11-07 DIAGNOSIS — I252 Old myocardial infarction: Secondary | ICD-10-CM | POA: Insufficient documentation

## 2023-11-07 DIAGNOSIS — G4733 Obstructive sleep apnea (adult) (pediatric): Secondary | ICD-10-CM | POA: Insufficient documentation

## 2023-11-07 DIAGNOSIS — E1122 Type 2 diabetes mellitus with diabetic chronic kidney disease: Secondary | ICD-10-CM | POA: Insufficient documentation

## 2023-11-07 DIAGNOSIS — I428 Other cardiomyopathies: Secondary | ICD-10-CM | POA: Diagnosis not present

## 2023-11-07 DIAGNOSIS — N1831 Chronic kidney disease, stage 3a: Secondary | ICD-10-CM

## 2023-11-07 DIAGNOSIS — I5022 Chronic systolic (congestive) heart failure: Secondary | ICD-10-CM | POA: Insufficient documentation

## 2023-11-07 DIAGNOSIS — Q2543 Congenital aneurysm of aorta: Secondary | ICD-10-CM | POA: Diagnosis not present

## 2023-11-07 DIAGNOSIS — I480 Paroxysmal atrial fibrillation: Secondary | ICD-10-CM | POA: Insufficient documentation

## 2023-11-07 DIAGNOSIS — Z7901 Long term (current) use of anticoagulants: Secondary | ICD-10-CM | POA: Insufficient documentation

## 2023-11-07 DIAGNOSIS — Z95810 Presence of automatic (implantable) cardiac defibrillator: Secondary | ICD-10-CM | POA: Diagnosis not present

## 2023-11-07 DIAGNOSIS — N182 Chronic kidney disease, stage 2 (mild): Secondary | ICD-10-CM | POA: Diagnosis not present

## 2023-11-07 DIAGNOSIS — I493 Ventricular premature depolarization: Secondary | ICD-10-CM | POA: Diagnosis not present

## 2023-11-07 DIAGNOSIS — C61 Malignant neoplasm of prostate: Secondary | ICD-10-CM | POA: Diagnosis not present

## 2023-11-07 DIAGNOSIS — I130 Hypertensive heart and chronic kidney disease with heart failure and stage 1 through stage 4 chronic kidney disease, or unspecified chronic kidney disease: Secondary | ICD-10-CM | POA: Insufficient documentation

## 2023-11-07 DIAGNOSIS — I5020 Unspecified systolic (congestive) heart failure: Secondary | ICD-10-CM

## 2023-11-07 LAB — BASIC METABOLIC PANEL
Anion gap: 11 (ref 5–15)
BUN: 28 mg/dL — ABNORMAL HIGH (ref 8–23)
CO2: 26 mmol/L (ref 22–32)
Calcium: 9 mg/dL (ref 8.9–10.3)
Chloride: 97 mmol/L — ABNORMAL LOW (ref 98–111)
Creatinine, Ser: 1.39 mg/dL — ABNORMAL HIGH (ref 0.61–1.24)
GFR, Estimated: 57 mL/min — ABNORMAL LOW (ref >60)
Glucose, Bld: 103 mg/dL — ABNORMAL HIGH (ref 70–99)
Potassium: 4.7 mmol/L (ref 3.5–5.1)
Sodium: 134 mmol/L — ABNORMAL LOW (ref 135–145)

## 2023-11-07 LAB — CBC
HCT: 52 % (ref 39.0–52.0)
Hemoglobin: 17.8 g/dL — ABNORMAL HIGH (ref 13.0–17.0)
MCH: 31.6 pg (ref 26.0–34.0)
MCHC: 34.2 g/dL (ref 30.0–36.0)
MCV: 92.2 fL (ref 80.0–100.0)
Platelets: 125 10*3/uL — ABNORMAL LOW (ref 150–400)
RBC: 5.64 MIL/uL (ref 4.22–5.81)
RDW: 13 % (ref 11.5–15.5)
WBC: 7.8 10*3/uL (ref 4.0–10.5)
nRBC: 0 % (ref 0.0–0.2)

## 2023-11-07 LAB — BRAIN NATRIURETIC PEPTIDE: B Natriuretic Peptide: 462.1 pg/mL — ABNORMAL HIGH (ref 0.0–100.0)

## 2023-11-07 NOTE — Progress Notes (Signed)
Orders placed for RHC with Dr. Gasper Lloyd on 11/25/23 at [facility]

## 2023-11-07 NOTE — Patient Instructions (Signed)
Medication Changes:  No Changes In Medications at this time.  Lab Work:  Labs done today, your results will be available in MyChart, we will contact you for abnormal readings.  Testing/Procedures:  Your physician has recommended that you have a pulmonary function test. Pulmonary Function Tests are a group of tests that measure how well air moves in and out of your lungs.  Special Instructions // Education:   [facility] [address] [place] [address] Dept: [phone] Loc: [phone]  James [name]  11/07/2023  You are scheduled for a Cardiac Catheterization on Monday, February 10 with Dr.  Gasper Lloyd .  1. Please arrive at the [facility] (Main Entrance A) at [facility]: [address] [place], [place] [address] at 7:00 AM (This time is 2 hour(s) before your procedure to ensure your preparation).   Free valet parking service is available. You will check in at ADMITTING. The support person will be asked to wait in the waiting room.  It is OK to have someone drop you off and come back when you are ready to be discharged.    Special note: Every effort is made to have your procedure done on time. Please understand that emergencies sometimes delay scheduled procedures.  2. Diet: Do not eat solid foods after midnight.  The patient may have clear liquids until 5am upon the day of the procedure.  3. Labs: You will need to have blood drawn on   4. Medication instructions in preparation for your procedure:  DO NOT TAKE TORSEMIDE THE MORNING OF PROCEDURE   DO NOT TAKE SPIRONOLACTONE THE MORNING OF PROCEDURE   DO NOT TAKE FARXIGA THE MORNING OF PROCEDURE    Contrast Allergy: No   5. Plan to go home the same day, you will only stay overnight if medically necessary. 6. Bring a current list of your medications and current insurance cards. 7. You MUST have a responsible person to drive  you home. 8. Someone MUST be with you the first 24 hours after you arrive home or your discharge will be delayed. 9. Please wear clothes that are easy to get on and off and wear slip-on shoes.  Thank you for allowing [name] to care for you!   -- [place] [facility]  Follow-Up in: 1 month as scheduled   At the Advanced [facility] Failure Clinic, you and your health needs are our priority. We have a designated team specialized in the treatment of Heart Failure. This Care Team includes your primary Heart Failure Specialized Cardiologist (physician), Advanced Practice Providers (APPs- Physician Assistants and Nurse Practitioners), and Pharmacist who all work together to provide you with the care you need, when you need it.   You may see any of the following providers on your designated Care Team at your next follow up:  Dr. Arvilla Meres Dr. Marca Ancona Dr. Dorthula Nettles Dr. Theresia Bough Tonye Becket, NP Robbie Lis, [place] [facility] [place], [place] Brynda Peon, NP [name], NP Karle Plumber, PharmD   Please be sure to bring in all your medications bottles to every appointment.   Need to Contact us:  If you have any questions or concerns before your next appointment please send [name] a message through [place] or call our office at [phone].    TO LEAVE A MESSAGE FOR THE NURSE SELECT OPTION 2, PLEASE LEAVE A MESSAGE INCLUDING: YOUR NAME DATE OF BIRTH CALL BACK NUMBER  REASON FOR CALL**this is important as we prioritize the call backs  YOU WILL RECEIVE A CALL BACK THE SAME DAY AS LONG AS YOU CALL BEFORE 4:00 PM

## 2023-11-07 NOTE — Telephone Encounter (Signed)
Faxed to [facility]  

## 2023-11-07 NOTE — Telephone Encounter (Signed)
Copied from CRM [id number]. Topic: Medical Record Request - Records Request >> Nov 07, 2023 11:47 AM Geroge Baseman wrote: Reason for CRM: Patients daughter called to relay Dr. Liliane Shi needs a copy of his med list. Fax to [phone] patty at [facility].

## 2023-11-11 NOTE — Progress Notes (Signed)
GU Location of Tumor / Histology: Prostate Ca  If Prostate Cancer, Gleason Score is (3 + 4) and PSA is (7.5 on 05/27/2023)  Biopsies      07/04/2023 Marcine Matar NM PET (PSMA) Skull to Mid Thigh CLINICAL DATA:  Initial staging of intermediate risk prostate cancer.  IMPRESSION: 1. Tracer avid prostatic primary or primaries, without evidence of metastatic disease. 2. 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 No specific imaging follow-up required. 3. Similarly, a left iliac mixed attenuation, primarily sclerotic lesion is tracer avid but unchanged back to at least 12/11/2020, considered benign. 4. Diffuse thyroid uptake is nonspecific but has been demonstrated in thyroiditis or underlying neoplasms. Consider further evaluation with thyroid ultrasound. 5. Incidental findings, including: Coronary artery atherosclerosis. Aortic Atherosclerosis (ICD10-I70.0). Sinus disease. Esophageal air fluid level suggests dysmotility or gastroesophageal reflux. New tiny bilateral pleural effusions.    Past/Anticipated interventions by urology, if any:     Past/Anticipated interventions by medical oncology, if any: NA  Weight changes, if any:  No  IPSS:  2 SHIM: 12  Bowel/Bladder complaints, if any:  No  Nausea/Vomiting, if any:  No  Pain issues, if any:  No  SAFETY ISSUES: Prior radiation? No Pacemaker/ICD? Yes, ICD by Dr. Lewayne Bunting 08/2021. Possible current pregnancy? Male Is the patient on methotrexate? No  Current Complaints / other details:

## 2023-11-12 ENCOUNTER — Other Ambulatory Visit (HOSPITAL_COMMUNITY): Payer: Self-pay | Admitting: Cardiology

## 2023-11-17 ENCOUNTER — Encounter: Payer: Self-pay | Admitting: Urology

## 2023-11-17 DIAGNOSIS — C61 Malignant neoplasm of prostate: Secondary | ICD-10-CM | POA: Insufficient documentation

## 2023-11-17 NOTE — Progress Notes (Signed)
Radiation Oncology         [phone] ________________________________  Initial Outpatient Consultation  Name: James [name] MRN: [id number]  Date: 11/18/2023  DOB: 1961-08-20  NF:AOZHY, Lucina Mellow, MD  Shelly Rubenstein*   REFERRING PHYSICIAN: Shelly Rubenstein*  DIAGNOSIS: 63 y.o. gentleman with Stage T1c adenocarcinoma of the prostate with Gleason score of 3+4, and PSA of 7.5.  No diagnosis found.  HISTORY OF PRESENT ILLNESS: James [name] is a 63 y.o. male with a diagnosis of prostate cancer. He was noted to have an elevated PSA of 7 by his primary care physician, Dr. Durwin Nora.  Accordingly, he was referred for evaluation in urology by Dr. Retta Diones on 05/27/23,  digital rectal examination performed at that time showed no nodules or induration. A repeat PSA obtained that day showed persistent elevation to 7.5. The patient proceeded to transrectal ultrasound with 12 biopsies of the prostate on 06/11/23.  The prostate volume measured 51 cc.  Out of 12 core biopsies, 11 were positive.  The maximum Gleason score was 3+4, and this was seen in all 6 left-sided cores, right base, right base lateral, and right mid lateral. Additionally, Gleason 3+3 was seen in right apex (small focus) and right apex lateral (two foci).  He underwent staging PSMA PET scan on 07/04/23 showing no evidence of disease outside of the prostate. He met Dr. Liliane Shi on 11/07/23  The patient reviewed the biopsy results with his urologist and he has kindly been referred today for discussion of potential radiation treatment options.   PREVIOUS RADIATION THERAPY: No  PAST MEDICAL HISTORY:  Past Medical History:  Diagnosis Date   CKD (chronic kidney disease) stage 2, GFR 60-89 ml/min    COPD (chronic obstructive pulmonary disease) (HCC)    Diabetes mellitus type II, non insulin dependent (HCC)    Dilated aortic root (HCC)    Hypercholesteremia    Hypertension    NICM (nonischemic cardiomyopathy) (HCC)     NSVT (nonsustained ventricular tachycardia) (HCC)    Obstructive sleep apnea    Paroxysmal atrial fibrillation (HCC)    Pulmonary hypertension (HCC)    PVC's (premature ventricular contractions)    Tobacco abuse       PAST SURGICAL HISTORY: Past Surgical History:  Procedure Laterality Date   CARDIAC CATHETERIZATION  2002   COLONOSCOPY N/A 03/22/2014   Procedure: COLONOSCOPY;  Surgeon: West Bali, MD;  Location: [facility];  Service: Endoscopy;  Laterality: N/A;  11:15 AM   ICD IMPLANT N/A 08/21/2021   Procedure: ICD IMPLANT;  Surgeon: Marinus Maw, MD;  Location: [facility] INVASIVE CV LAB;  Service: Cardiovascular;  Laterality: N/A;   INGUINAL HERNIA REPAIR Right 06/24/2017   Procedure: HERNIA REPAIR INGUINAL ADULT WITH MESH;  Surgeon: Franky Macho, MD;  Location: [facility] ORS;  Service: General;  Laterality: Right;   RIGHT HEART CATH N/A 11/13/2022   Procedure: RIGHT HEART CATH;  Surgeon: Dorthula Nettles, DO;  Location: [facility] INVASIVE CV LAB;  Service: Cardiovascular;  Laterality: N/A;   RIGHT HEART CATH N/A 12/21/2022   Procedure: RIGHT HEART CATH;  Surgeon: Dorthula Nettles, DO;  Location: [facility] INVASIVE CV LAB;  Service: Cardiovascular;  Laterality: N/A;   RIGHT/LEFT HEART CATH AND CORONARY ANGIOGRAPHY N/A 07/19/2021   Procedure: RIGHT/LEFT HEART CATH AND CORONARY ANGIOGRAPHY;  Surgeon: Kathleene Hazel, MD;  Location: [facility] INVASIVE CV LAB;  Service: Cardiovascular;  Laterality: N/A;   TEE WITHOUT CARDIOVERSION N/A 12/21/2022   Procedure: TRANSESOPHAGEAL ECHOCARDIOGRAM (TEE);  Surgeon: Dorthula Nettles, DO;  Location: [facility] ENDOS[facility];  Service: Cardiovascular;  Laterality: N/A;    FAMILY HISTORY:  Family History  Problem Relation Age of Onset   Coronary artery disease Mother    Diabetes Father    Heart attack Father    Hypertension Father    Iron deficiency Father    Thyroid disease Sister    Cardiomyopathy Brother        No significant coronary disease by coronary angiography   Heart  disease Maternal Grandfather        Also paternal grandfather   Sudden death Other    Colon cancer Neg Hx     SOCIAL HISTORY:  Social History   Socioeconomic History   Marital status: Married    Spouse name: Maceo Hernan   Number of children: 4   Years of education: Not on file   Highest education level: Associate degree: occupational, Scientist, product/process development, or vocational program  Occupational History   Occupation: Merchandiser, retail    Comment: [employer]  Tobacco Use   Smoking status: Former    Current packs/day: 0.00    Average packs/day: 0.5 packs/day for 38.8 years (19.4 ttl pk-yrs)    Types: Cigarettes    Start date: 10/15/1982    Quit date: 07/18/2021    Years since quitting: 2.3   Smokeless tobacco: Never   Tobacco comments:    3 cigaretts a day  Vaping Use   Vaping status: Never Used  Substance and Sexual Activity   Alcohol use: No    Alcohol/week: 0.0 standard drinks of alcohol   Drug use: No   Sexual activity: Yes    Birth control/protection: None  Other Topics Concern   Not on file  Social History Narrative   Divorced   No regular exercise   Social Drivers of Health   Financial Resource Strain: Low Risk  (07/21/2021)   Overall Financial Resource Strain (CARDIA)    Difficulty of Paying Living Expenses: Not very hard  Food Insecurity: No Food Insecurity (10/15/2023)   Hunger Vital Sign    Worried About Running Out of Food in the Last Year: Never true    Ran Out of Food in the Last Year: Never true  Transportation Needs: No Transportation Needs (10/15/2023)   PRAPARE - Administrator, Civil Service (Medical): No    Lack of Transportation (Non-Medical): No  Physical Activity: Not on file  Stress: Not on file  Social Connections: Not on file  Intimate Partner Violence: Not At Risk (10/15/2023)   Humiliation, Afraid, Rape, and Kick questionnaire    Fear of Current or Ex-Partner: No    Emotionally Abused: No    Physically Abused: No    Sexually Abused: No     ALLERGIES: Patient has no known allergies.  MEDICATIONS:  Current Outpatient Medications  Medication Sig Dispense Refill   acetaminophen (TYLENOL) 500 MG tablet Take 1,000 mg by mouth every 6 (six) hours as needed for moderate pain.     albuterol (VENTOLIN HFA) 108 (90 Base) MCG/ACT inhaler INHALE 2 PUFFS BY MOUTH FOUR TIMES DAILY AS NEEDED 6.7 g 1   apixaban (ELIQUIS) 5 MG TABS tablet Take 1 tablet (5 mg total) by mouth 2 (two) times daily. 60 tablet 11   atorvastatin (LIPITOR) 40 MG tablet TAKE 1 TABLET(40 MG) BY MOUTH DAILY 90 tablet 3   carvedilol (COREG) 6.25 MG tablet Take 1 tablet (6.25 mg total) by mouth 2 (two) times daily with a meal. 60 tablet 1   dapagliflozin propanediol (FARXIGA)  10 MG TABS tablet Take 1 tablet (10 mg total) by mouth daily before breakfast. 90 tablet 3   ENTRESTO 97-103 MG TAKE 1 TABLET BY MOUTH TWICE DAILY 60 tablet 11   meclizine (ANTIVERT) 25 MG tablet Take 1 tablet (25 mg total) by mouth 3 (three) times daily as needed for dizziness. 30 tablet 0   metFORMIN (GLUCOPHAGE) 500 MG tablet TAKE 1 TABLET BY MOUTH TWICE DAILY 180 tablet 1   promethazine-dextromethorphan (PROMETHAZINE-DM) 6.25-15 MG/5ML syrup Take 5 mLs by mouth 4 (four) times daily as needed. 118 mL 0   spironolactone (ALDACTONE) 25 MG tablet TAKE 1 TABLET(25 MG) BY MOUTH DAILY 90 tablet 3   torsemide (DEMADEX) 20 MG tablet Take 2 tablets (40 mg total) by mouth daily. 60 tablet 2   No current facility-administered medications for this encounter.   Facility-Administered Medications Ordered in Other Encounters  Medication Dose Route Frequency Provider Last Rate Last Admin   sodium chloride flush (NS) 0.9 % injection 3 mL  3 mL Intravenous Q12H Sabharwal, Aditya, DO        REVIEW OF SYSTEMS:  On review of systems, the patient reports that he is doing well overall. He denies any chest pain, shortness of breath, cough, fevers, chills, night sweats, unintended weight changes. He denies any bowel  disturbances, and denies abdominal pain, nausea or vomiting. He denies any new musculoskeletal or joint aches or pains. His IPSS was ***, indicating *** urinary symptoms. His SHIM was ***, indicating he {does not have/has mild/moderate/severe} erectile dysfunction. A complete review of systems is obtained and is otherwise negative.    PHYSICAL EXAM:  Wt Readings from Last 3 Encounters:  11/07/23 244 lb 9.6 oz (110.9 kg)  10/15/23 245 lb (111.1 kg)  10/14/23 243 lb (110.2 kg)   Temp Readings from Last 3 Encounters:  10/14/23 97.7 F (36.5 C) (Oral)  08/26/23 (!) 97.5 F (36.4 C) (Oral)  08/11/23 98.5 F (36.9 C)   BP Readings from Last 3 Encounters:  11/07/23 104/70  10/14/23 110/87  09/25/23 127/80   Pulse Readings from Last 3 Encounters:  11/07/23 69  10/14/23 62  09/25/23 80    /10  In general this is a well appearing *** male in no acute distress. He's alert and oriented x4 and appropriate throughout the examination. Cardiopulmonary assessment is negative for acute distress, and he exhibits normal effort.     KPS = ***  100 - Normal; no complaints; no evidence of disease. 90   - Able to carry on normal activity; minor signs or symptoms of disease. 80   - Normal activity with effort; some signs or symptoms of disease. 31   - Cares for self; unable to carry on normal activity or to do active work. 60   - Requires occasional assistance, but is able to care for most of his personal needs. 50   - Requires considerable assistance and frequent medical care. 40   - Disabled; requires special care and assistance. 30   - Severely disabled; hospital admission is indicated although death not imminent. 20   - Very sick; hospital admission necessary; active supportive treatment necessary. 10   - Moribund; fatal processes progressing rapidly. 0     - Dead  Karnofsky DA, Abelmann WH, Craver LS and Burchenal [facility] [phone]) The use of the nitrogen mustards in the palliative treatment of  carcinoma: with particular reference to bronchogenic carcinoma Cancer 1 634-56  LABORATORY DATA:  Lab Results  Component Value Date  WBC 7.8 11/07/2023   HGB 17.8 (H) 11/07/2023   HCT 52.0 11/07/2023   MCV 92.2 11/07/2023   PLT 125 (L) 11/07/2023   Lab Results  Component Value Date   NA 134 (L) 11/07/2023   K 4.7 11/07/2023   CL 97 (L) 11/07/2023   CO2 26 11/07/2023   Lab Results  Component Value Date   ALT 22 03/22/2023   AST 22 03/22/2023   ALKPHOS 73 03/22/2023   BILITOT 1.1 03/22/2023     RADIOGRAPHY: No results found.    IMPRESSION/PLAN: 1. 63 y.o. gentleman with Stage T1c adenocarcinoma of the prostate with Gleason Score of 3+4, and PSA of 7.5. We discussed the patient's workup and outlined the nature of prostate cancer in this setting. The patient's T stage, Gleason's score, and PSA put him into the intermediate risk group. Accordingly, he is eligible for a variety of potential treatment options including brachytherapy, 5.5 weeks of external radiation, or prostatectomy. We discussed the available radiation techniques, and focused on the details and logistics of delivery. We discussed and outlined the risks, benefits, short and long-term effects associated with radiotherapy and compared and contrasted these with prostatectomy. We discussed the role of SpaceOAR gel in reducing the rectal toxicity associated with radiotherapy.  He appears to have a good understanding of his disease and our treatment recommendations which are of curative intent.  He was encouraged to ask questions that were answered to his stated satisfaction.  At the conclusion of our conversation, the patient is interested in moving forward with ***.  We personally spent *** minutes in this encounter including chart review, reviewing radiological studies, meeting face-to-face with the patient, entering orders and completing documentation.    Marguarite Arbour, PA-C    Margaretmary Dys, MD  [facility]   Radiation Oncology Direct Dial: [phone]  Fax: [phone] [email or web address]  Skype  [email or web address]   This document serves as a record of services personally performed by Margaretmary Dys, MD and Marcello Fennel, PA-C. It was created on their behalf by Mickie Bail, a trained medical scribe. The creation of this record is based on the scribe's personal observations and the provider's statements to them. This document has been checked and approved by the attending provider.

## 2023-11-18 ENCOUNTER — Ambulatory Visit
Admission: RE | Admit: 2023-11-18 | Discharge: 2023-11-18 | Disposition: A | Discharge status: Home / Self Care | Payer: BC Managed Care – PPO | Source: Ambulatory Visit | Attending: Radiation Oncology | Admitting: Radiation Oncology

## 2023-11-18 ENCOUNTER — Ambulatory Visit: Payer: BC Managed Care – PPO | Admitting: Physician Assistant

## 2023-11-18 ENCOUNTER — Ambulatory Visit: Payer: BC Managed Care – PPO

## 2023-11-18 ENCOUNTER — Encounter: Payer: Self-pay | Admitting: Radiation Oncology

## 2023-11-18 ENCOUNTER — Encounter: Payer: Self-pay | Admitting: Internal Medicine

## 2023-11-18 VITALS — BP 122/75 | HR 67 | Temp 97.6°F | Resp 20 | Ht 78.0 in | Wt 247.6 lb

## 2023-11-18 DIAGNOSIS — Z7984 Long term (current) use of oral hypoglycemic drugs: Secondary | ICD-10-CM | POA: Diagnosis not present

## 2023-11-18 DIAGNOSIS — Z79899 Other long term (current) drug therapy: Secondary | ICD-10-CM | POA: Insufficient documentation

## 2023-11-18 DIAGNOSIS — C61 Malignant neoplasm of prostate: Secondary | ICD-10-CM

## 2023-11-18 DIAGNOSIS — N183 Chronic kidney disease, stage 3 unspecified: Secondary | ICD-10-CM | POA: Diagnosis not present

## 2023-11-18 DIAGNOSIS — I480 Paroxysmal atrial fibrillation: Secondary | ICD-10-CM | POA: Insufficient documentation

## 2023-11-18 DIAGNOSIS — J449 Chronic obstructive pulmonary disease, unspecified: Secondary | ICD-10-CM | POA: Diagnosis not present

## 2023-11-18 DIAGNOSIS — Z191 Hormone sensitive malignancy status: Secondary | ICD-10-CM | POA: Diagnosis not present

## 2023-11-18 DIAGNOSIS — I130 Hypertensive heart and chronic kidney disease with heart failure and stage 1 through stage 4 chronic kidney disease, or unspecified chronic kidney disease: Secondary | ICD-10-CM | POA: Diagnosis not present

## 2023-11-18 DIAGNOSIS — E7800 Pure hypercholesterolemia, unspecified: Secondary | ICD-10-CM | POA: Diagnosis not present

## 2023-11-18 DIAGNOSIS — I272 Pulmonary hypertension, unspecified: Secondary | ICD-10-CM | POA: Diagnosis not present

## 2023-11-18 DIAGNOSIS — Z87891 Personal history of nicotine dependence: Secondary | ICD-10-CM | POA: Diagnosis not present

## 2023-11-18 DIAGNOSIS — E1122 Type 2 diabetes mellitus with diabetic chronic kidney disease: Secondary | ICD-10-CM | POA: Insufficient documentation

## 2023-11-18 DIAGNOSIS — G4733 Obstructive sleep apnea (adult) (pediatric): Secondary | ICD-10-CM | POA: Diagnosis not present

## 2023-11-18 DIAGNOSIS — Z7901 Long term (current) use of anticoagulants: Secondary | ICD-10-CM | POA: Insufficient documentation

## 2023-11-18 HISTORY — DX: Elevated prostate specific antigen (PSA): R97.20

## 2023-11-18 NOTE — Progress Notes (Addendum)
Introduced myself to the patient as the prostate nurse navigator. He is here to discuss his radiation treatment options.  I gave him my business card and asked him to call me with questions or concerns.  Verbalized understanding.   Patient does wish to cancel upcoming surgical consult with Dr. Marlou Porch on 2/6.  RN spoke with Dr. Jasmine Awe nurse and they will have appointment cancelled.

## 2023-11-18 NOTE — Progress Notes (Signed)
TO BE COMPLETED BY RADIATION ONCOLOGIST OFFICE:   Patient Name: James [name]   Date of Birth: 08-Sep-1961   Radiation Oncologist: Dr. Margaretmary Dys   Site to be Treated: Prostate  Will x-rays >10 MV be used? No  Will the radiation be >10 cm from the device? Yes  Planned Treatment Start Date: 1 Week  TO BE COMPLETED BY CARDIOLOGIST OFFICE:   Device Information:  Pacemaker []      ICD [x]    Brand: Biotronik: [phone] Model #: Biotronik [id number] Acticor 7 VR-T DX  Serial Number: [id number]     Date of Placement: 08/21/2021  Site of Placement: Chest  Remote Device Check--Frequency: Every 91 days   Last Check: 06/13/2023  Is the Patient Pacer Dependent?:  Yes []   No [x]   Does cardiologist request Radiation Oncology to schedule device testing by vendor for the following:  Prior to the Initiation of Treatments?  Yes []  No [x]  During Treatments?  Yes []  No [x]  Post Radiation Treatments?  Yes []  No [x]   Is device monitoring necessary by vendor/cardiologist team during treatments?  Yes []   No [x]   Is cardiac monitoring by Radiation Oncology nursing necessary during treatments? Yes []   No [x]   Do you recommend device be relocated prior to Radiation Treatment? Yes []   No [x]   **PLEASE LIST ANY NOTES OR SPECIAL REQUESTS:       CARDIOLOGIST SIGNATURE:  Dr. Lewayne Bunting Per Device Clinic Standing Orders, Lenor Coffin  11/18/2023 9:15 AM  **Please route completed form back to Radiation Oncology Nursing and "P [facility] RAD ONC ADMIN", OR send an update if there will be a delay in having form completed by expected start date.  **Call [phone] if you have any questions or do not get an in-basket response from a Radiation Oncology staff member

## 2023-11-20 ENCOUNTER — Ambulatory Visit: Payer: BC Managed Care – PPO | Admitting: Radiation Oncology

## 2023-11-21 ENCOUNTER — Encounter (HOSPITAL_COMMUNITY): Payer: BC Managed Care – PPO

## 2023-11-21 NOTE — Progress Notes (Signed)
 Patient will be seen at [facility] for Radiation Oncology Consult on 2/7 to receive treatment closer to his home.

## 2023-11-22 DIAGNOSIS — I428 Other cardiomyopathies: Secondary | ICD-10-CM | POA: Diagnosis not present

## 2023-11-22 DIAGNOSIS — C61 Malignant neoplasm of prostate: Secondary | ICD-10-CM | POA: Diagnosis not present

## 2023-11-22 DIAGNOSIS — E7800 Pure hypercholesterolemia, unspecified: Secondary | ICD-10-CM | POA: Diagnosis not present

## 2023-11-22 DIAGNOSIS — J449 Chronic obstructive pulmonary disease, unspecified: Secondary | ICD-10-CM | POA: Diagnosis not present

## 2023-11-22 DIAGNOSIS — Z510 Encounter for antineoplastic radiation therapy: Secondary | ICD-10-CM | POA: Diagnosis not present

## 2023-11-22 DIAGNOSIS — I509 Heart failure, unspecified: Secondary | ICD-10-CM | POA: Diagnosis not present

## 2023-11-22 DIAGNOSIS — I480 Paroxysmal atrial fibrillation: Secondary | ICD-10-CM | POA: Diagnosis not present

## 2023-11-22 DIAGNOSIS — E1122 Type 2 diabetes mellitus with diabetic chronic kidney disease: Secondary | ICD-10-CM | POA: Diagnosis not present

## 2023-11-22 DIAGNOSIS — N183 Chronic kidney disease, stage 3 unspecified: Secondary | ICD-10-CM | POA: Diagnosis not present

## 2023-11-22 DIAGNOSIS — I130 Hypertensive heart and chronic kidney disease with heart failure and stage 1 through stage 4 chronic kidney disease, or unspecified chronic kidney disease: Secondary | ICD-10-CM | POA: Diagnosis not present

## 2023-11-22 DIAGNOSIS — Z87891 Personal history of nicotine dependence: Secondary | ICD-10-CM | POA: Diagnosis not present

## 2023-11-25 ENCOUNTER — Other Ambulatory Visit: Payer: Self-pay

## 2023-11-25 ENCOUNTER — Ambulatory Visit (HOSPITAL_COMMUNITY)
Admission: RE | Admit: 2023-11-25 | Discharge: 2023-11-25 | Disposition: A | Discharge status: Home / Self Care | Payer: BC Managed Care – PPO | Attending: Cardiology | Admitting: Cardiology

## 2023-11-25 ENCOUNTER — Encounter (HOSPITAL_COMMUNITY)
Admission: RE | Disposition: A | Discharge status: Home / Self Care | Payer: Self-pay | Source: Home / Self Care | Attending: Cardiology

## 2023-11-25 DIAGNOSIS — Z79899 Other long term (current) drug therapy: Secondary | ICD-10-CM | POA: Insufficient documentation

## 2023-11-25 DIAGNOSIS — Z87891 Personal history of nicotine dependence: Secondary | ICD-10-CM | POA: Insufficient documentation

## 2023-11-25 DIAGNOSIS — G4733 Obstructive sleep apnea (adult) (pediatric): Secondary | ICD-10-CM | POA: Diagnosis not present

## 2023-11-25 DIAGNOSIS — I4892 Unspecified atrial flutter: Secondary | ICD-10-CM | POA: Insufficient documentation

## 2023-11-25 DIAGNOSIS — I130 Hypertensive heart and chronic kidney disease with heart failure and stage 1 through stage 4 chronic kidney disease, or unspecified chronic kidney disease: Secondary | ICD-10-CM | POA: Diagnosis not present

## 2023-11-25 DIAGNOSIS — E1122 Type 2 diabetes mellitus with diabetic chronic kidney disease: Secondary | ICD-10-CM | POA: Diagnosis not present

## 2023-11-25 DIAGNOSIS — Z7984 Long term (current) use of oral hypoglycemic drugs: Secondary | ICD-10-CM | POA: Insufficient documentation

## 2023-11-25 DIAGNOSIS — I480 Paroxysmal atrial fibrillation: Secondary | ICD-10-CM | POA: Diagnosis not present

## 2023-11-25 DIAGNOSIS — I5022 Chronic systolic (congestive) heart failure: Secondary | ICD-10-CM

## 2023-11-25 DIAGNOSIS — I428 Other cardiomyopathies: Secondary | ICD-10-CM | POA: Diagnosis not present

## 2023-11-25 DIAGNOSIS — Z7901 Long term (current) use of anticoagulants: Secondary | ICD-10-CM | POA: Insufficient documentation

## 2023-11-25 DIAGNOSIS — Z95810 Presence of automatic (implantable) cardiac defibrillator: Secondary | ICD-10-CM | POA: Diagnosis not present

## 2023-11-25 DIAGNOSIS — I509 Heart failure, unspecified: Secondary | ICD-10-CM | POA: Diagnosis not present

## 2023-11-25 DIAGNOSIS — N189 Chronic kidney disease, unspecified: Secondary | ICD-10-CM | POA: Diagnosis not present

## 2023-11-25 DIAGNOSIS — I5023 Acute on chronic systolic (congestive) heart failure: Secondary | ICD-10-CM | POA: Insufficient documentation

## 2023-11-25 HISTORY — PX: RIGHT HEART CATH: CATH118263

## 2023-11-25 LAB — POCT I-STAT EG7
Acid-Base Excess: 1 mmol/L (ref 0.0–2.0)
Acid-Base Excess: 1 mmol/L (ref 0.0–2.0)
Bicarbonate: 27.3 mmol/L (ref 20.0–28.0)
Bicarbonate: 27.4 mmol/L (ref 20.0–28.0)
Calcium, Ion: 1.22 mmol/L (ref 1.15–1.40)
Calcium, Ion: 1.24 mmol/L (ref 1.15–1.40)
HCT: 48 % (ref 39.0–52.0)
HCT: 48 % (ref 39.0–52.0)
Hemoglobin: 16.3 g/dL (ref 13.0–17.0)
Hemoglobin: 16.3 g/dL (ref 13.0–17.0)
O2 Saturation: 66 %
O2 Saturation: 68 %
Potassium: 4.3 mmol/L (ref 3.5–5.1)
Potassium: 4.3 mmol/L (ref 3.5–5.1)
Sodium: 138 mmol/L (ref 135–145)
Sodium: 139 mmol/L (ref 135–145)
TCO2: 29 mmol/L (ref 22–32)
TCO2: 29 mmol/L (ref 22–32)
pCO2, Ven: 47.5 mm[Hg] (ref 44–60)
pCO2, Ven: 47.8 mm[Hg] (ref 44–60)
pH, Ven: 7.365 (ref 7.25–7.43)
pH, Ven: 7.369 (ref 7.25–7.43)
pO2, Ven: 36 mm[Hg] (ref 32–45)
pO2, Ven: 37 mm[Hg] (ref 32–45)

## 2023-11-25 LAB — GLUCOSE, CAPILLARY
Glucose-Capillary: 106 mg/dL — ABNORMAL HIGH (ref 70–99)
Glucose-Capillary: 84 mg/dL (ref 70–99)

## 2023-11-25 SURGERY — RIGHT HEART CATH
Anesthesia: LOCAL

## 2023-11-25 MED ORDER — SODIUM CHLORIDE 0.9 % IV SOLN: INTRAVENOUS | Status: DC

## 2023-11-25 MED ORDER — HEPARIN (PORCINE) IN NACL 1000-0.9 UT/500ML-% IV SOLN
INTRAVENOUS | Status: DC | PRN
  Administered 2023-11-25: 500 mL

## 2023-11-25 MED ORDER — LIDOCAINE HCL (PF) 1 % IJ SOLN
INTRAMUSCULAR | Status: DC | PRN
  Administered 2023-11-25: 2 mL

## 2023-11-25 SURGICAL SUPPLY — 10 items
CATH SWAN GANZ 7F STRAIGHT (CATHETERS) IMPLANT
CATH SWAN GANZ VIP 7.5F (CATHETERS) IMPLANT
COVER SWIFTLINK CONNECTOR (BAG) IMPLANT
GLIDESHEATH SLENDER 7FR .021G (SHEATH) IMPLANT
PACK CARDIAC CATHETERIZATION (CUSTOM PROCEDURE TRAY) ×1 IMPLANT
SHEATH PINNACLE 7F 10CM (SHEATH) IMPLANT
SHEATH PROBE COVER 6X72 (BAG) IMPLANT
TRANSDUCER W/STOPCOCK (MISCELLANEOUS) IMPLANT
TUBING ART PRESS 72 MALE/FEM (TUBING) IMPLANT
WIRE MICRO SET SILHO 5FR 7 (SHEATH) IMPLANT

## 2023-11-25 NOTE — Interval H&P Note (Signed)
 History and Physical Interval Note:  11/25/2023 9:37 AM  James [name]  has presented today for surgery, with the diagnosis of heart failure.  The various methods of treatment have been discussed with the patient and family. After consideration of risks, benefits and other options for treatment, the patient has consented to  Procedure(s): RIGHT HEART CATH (N/A) as a surgical intervention.  The patient's history has been reviewed, patient examined, no change in status, stable for surgery.  I have reviewed the patient's chart and labs.  Questions were answered to the patient's satisfaction.     [name]

## 2023-11-25 NOTE — Discharge Instructions (Signed)

## 2023-11-26 ENCOUNTER — Encounter (HOSPITAL_COMMUNITY): Payer: Self-pay | Admitting: Cardiology

## 2023-12-02 ENCOUNTER — Encounter: Payer: Self-pay | Admitting: Internal Medicine

## 2023-12-02 ENCOUNTER — Ambulatory Visit: Payer: BC Managed Care – PPO | Admitting: Internal Medicine

## 2023-12-02 ENCOUNTER — Other Ambulatory Visit: Payer: Self-pay | Admitting: Internal Medicine

## 2023-12-02 ENCOUNTER — Ambulatory Visit: Payer: BC Managed Care – PPO | Attending: Internal Medicine | Admitting: Internal Medicine

## 2023-12-02 VITALS — BP 88/64 | HR 72 | Ht 78.0 in | Wt 249.6 lb

## 2023-12-02 VITALS — BP 122/80 | HR 68 | Ht 78.0 in | Wt 249.2 lb

## 2023-12-02 DIAGNOSIS — I5022 Chronic systolic (congestive) heart failure: Secondary | ICD-10-CM | POA: Diagnosis not present

## 2023-12-02 DIAGNOSIS — E1169 Type 2 diabetes mellitus with other specified complication: Secondary | ICD-10-CM

## 2023-12-02 DIAGNOSIS — C61 Malignant neoplasm of prostate: Secondary | ICD-10-CM

## 2023-12-02 DIAGNOSIS — G4733 Obstructive sleep apnea (adult) (pediatric): Secondary | ICD-10-CM

## 2023-12-02 DIAGNOSIS — N1831 Chronic kidney disease, stage 3a: Secondary | ICD-10-CM

## 2023-12-02 DIAGNOSIS — E559 Vitamin D deficiency, unspecified: Secondary | ICD-10-CM | POA: Insufficient documentation

## 2023-12-02 DIAGNOSIS — I480 Paroxysmal atrial fibrillation: Secondary | ICD-10-CM | POA: Diagnosis not present

## 2023-12-02 DIAGNOSIS — I5020 Unspecified systolic (congestive) heart failure: Secondary | ICD-10-CM

## 2023-12-02 DIAGNOSIS — E119 Type 2 diabetes mellitus without complications: Secondary | ICD-10-CM

## 2023-12-02 LAB — CUP PACEART INCLINIC DEVICE CHECK
Brady Statistic RV Percent Paced: 0 %
Date Time Interrogation Session: 20250217133705
Implantable Lead Connection Status: 753985
Implantable Lead Implant Date: 20221107
Implantable Lead Location: 753860
Implantable Lead Model: 436909
Implantable Lead Serial Number: 81476621
Implantable Pulse Generator Implant Date: 20221107
Lead Channel Impedance Value: 560 Ohm
Lead Channel Pacing Threshold Amplitude: 0.7 V
Lead Channel Sensing Intrinsic Amplitude: 11 mV
Lead Channel Sensing Intrinsic Amplitude: 11.7 mV
Pulse Gen Model: 429525
Pulse Gen Serial Number: 84864435

## 2023-12-02 MED ORDER — MECLIZINE HCL 25 MG PO TABS: 25.0000 mg | ORAL_TABLET | Freq: Three times a day (TID) | ORAL | 0 refills | Status: DC | PRN

## 2023-12-02 NOTE — Assessment & Plan Note (Signed)
Regular rate and rhythm detected on exam today.  He remains on carvedilol and Eliquis.

## 2023-12-02 NOTE — Assessment & Plan Note (Signed)
Feeling weak on exam today.  On appropriate GDMT.  Closely followed by the advanced HF clinic.  Recently underwent RHC and reports that he we will be moving towards transplant after completion of radiation.  Follow-up with the advanced HF clinic is scheduled for later this week (2/20).

## 2023-12-02 NOTE — Assessment & Plan Note (Signed)
Moderate OSA.  He endorses intermittent compliance with CPAP.  The importance of nightly compliance was reinforced today.

## 2023-12-02 NOTE — Assessment & Plan Note (Signed)
Recently diagnosed stage IIb prostate cancer.  He has established care with radiation oncology and reports that he will soon start radiation treatment.

## 2023-12-02 NOTE — Assessment & Plan Note (Signed)
Noted on recent labs.  Recommend starting daily vitamin D supplementation.

## 2023-12-02 NOTE — Patient Instructions (Signed)
 It was a pleasure to see you today.  Thank you for giving [name] the opportunity to be involved in your care.  Below is a brief recap of your visit and next steps.  We will plan to see you again in 3 months.  Summary No medication changes today Follow up in 3 months

## 2023-12-02 NOTE — Assessment & Plan Note (Signed)
A1c 7.2 on labs from December.  He remains on metformin and [place].  Repeat A1c at follow-up in 3 months.

## 2023-12-02 NOTE — Assessment & Plan Note (Signed)
CKD 3A.  Stable on labs from last month.  Remains on SGLT2 and Arni.

## 2023-12-02 NOTE — Patient Instructions (Signed)
Medication Instructions:  Your physician recommends that you continue on your current medications as directed. Please refer to the Current Medication list given to you today.   Labwork: None today  Testing/Procedures: None today  Follow-Up: 1 year Dr.Taylor  Any Other Special Instructions Will Be Listed Below (If Applicable).  If you need a refill on your cardiac medications before your next appointment, please call your pharmacy.  

## 2023-12-02 NOTE — Progress Notes (Signed)
Established Patient Office Visit  Subjective   Patient ID: James [name], male    DOB: September 04, 1961  Age: 63 y.o. MRN: [id number]  Chief Complaint  Patient presents with   Care Management    Follow up    James [name] returns to care today for routine follow-up.  He was last evaluated by me in August 2024.  No medication changes were made at that time, repeat labs ordered 102-month follow-up arranged.  He has had a complicated interval clinical course.  Multiple documented urgent care and ER visits as well as multiple acute visits at [facility].  Hospital admission at [facility] 12/25 - 12/30 attributed to HFrEF exacerbation.  He was seen for follow-up in the advanced HF clinic last month.  Recently underwent RHC as part of candidacy evaluation for transplant vs LVAD.  He has also established care with radiation oncology and will start radiation soon in the setting of recently diagnosed prostate cancer.  James [name] reports feeling well today.  He is asymptomatic and has no acute concerns to discuss.  Past Medical History:  Diagnosis Date   CKD (chronic kidney disease) stage 2, GFR 60-89 ml/min    COPD (chronic obstructive pulmonary disease) (HCC)    Diabetes mellitus type II, non insulin dependent (HCC)    Dilated aortic root (HCC)    Elevated PSA    Hypercholesteremia    Hypertension    NICM (nonischemic cardiomyopathy) (HCC)    NSVT (nonsustained ventricular tachycardia) (HCC)    Obstructive sleep apnea    Paroxysmal atrial fibrillation (HCC)    Pulmonary hypertension (HCC)    PVC's (premature ventricular contractions)    Tobacco abuse    Past Surgical History:  Procedure Laterality Date   CARDIAC CATHETERIZATION  10/15/2000   COLONOSCOPY N/A 03/22/2014   Procedure: COLONOSCOPY;  Surgeon: West Bali, MD;  [facility];  Service: Endoscopy;  Laterality: N/A;  11:15 AM   ICD IMPLANT N/A 08/21/2021   Procedure: ICD IMPLANT;  Surgeon: Marinus Maw, MD;  Location: [facility] INVASIVE  CV LAB;  Service: Cardiovascular;  Laterality: N/A;   INGUINAL HERNIA REPAIR Right 06/24/2017   Procedure: HERNIA REPAIR INGUINAL ADULT WITH MESH;  Surgeon: Franky Macho, MD;  Location: [facility] ORS;  Service: General;  Laterality: Right;   PROSTATE BIOPSY     RIGHT HEART CATH N/A 11/13/2022   Procedure: RIGHT HEART CATH;  Surgeon: Dorthula Nettles, DO;  Location: [facility] INVASIVE CV LAB;  Service: Cardiovascular;  Laterality: N/A;   RIGHT HEART CATH N/A 12/21/2022   Procedure: RIGHT HEART CATH;  Surgeon: Dorthula Nettles, DO;  Location: [facility] INVASIVE CV LAB;  Service: Cardiovascular;  Laterality: N/A;   RIGHT HEART CATH N/A 11/25/2023   Procedure: RIGHT HEART CATH;  Surgeon: Dorthula Nettles, DO;  [facility]: [facility] INVASIVE CV LAB;  Service: Cardiovascular;  Laterality: N/A;   RIGHT/LEFT HEART CATH AND CORONARY ANGIOGRAPHY N/A 07/19/2021   Procedure: RIGHT/LEFT HEART CATH AND CORONARY ANGIOGRAPHY;  Surgeon: Kathleene Hazel, MD;  Location: [facility] INVASIVE CV LAB;  Service: Cardiovascular;  Laterality: N/A;   TEE WITHOUT CARDIOVERSION N/A 12/21/2022   Procedure: TRANSESOPHAGEAL ECHOCARDIOGRAM (TEE);  Surgeon: Dorthula Nettles, DO;  Location: [facility] ENDOSCOPY;  Service: Cardiovascular;  Laterality: N/A;   Social History   Tobacco Use   Smoking status: Former    Current packs/day: 0.00    Average packs/day: 0.5 packs/day for 38.8 years (19.4 ttl pk-yrs)    Types: Cigarettes    Start date: 10/15/1982  Quit date: 07/18/2021    Years since quitting: 2.3   Smokeless tobacco: Never   Tobacco comments:    3 cigaretts a day  Vaping Use   Vaping status: Never Used  Substance Use Topics   Alcohol use: No    Alcohol/week: 0.0 standard drinks of alcohol   Drug use: No   Family History  Problem Relation Age of Onset   Coronary artery disease Mother    Diabetes Father    Heart attack Father    Hypertension Father    Iron deficiency Father    Thyroid disease Sister    Cardiomyopathy Brother        No  significant coronary disease by coronary angiography   Heart disease Maternal Grandfather        Also paternal grandfather   Sudden death Other    Colon cancer Neg Hx    No Known Allergies  Review of Systems  Constitutional:  Negative for chills and fever.  HENT:  Negative for sore throat.   Respiratory:  Negative for cough and shortness of breath.   Cardiovascular:  Negative for chest pain, palpitations and leg swelling.  Gastrointestinal:  Negative for abdominal pain, blood in stool, constipation, diarrhea, nausea and vomiting.  Genitourinary:  Negative for dysuria and hematuria.  Musculoskeletal:  Negative for myalgias.  Skin:  Negative for itching and rash.  Neurological:  Negative for dizziness and headaches.  Psychiatric/Behavioral:  Negative for depression and suicidal ideas.      Objective:     BP 122/80 (BP Location: Left Arm, Patient Position: Sitting, Cuff Size: Normal)   Pulse 68   Ht 6\' 6"  (1.981 m)   Wt 249 lb 3.2 oz (113 kg)   SpO2 95%   BMI 28.80 kg/m  BP Readings from Last 3 Encounters:  12/02/23 122/80  11/25/23 119/82  11/18/23 122/75   Physical Exam Vitals reviewed.  Constitutional:      General: He is not in acute distress.    Appearance: Normal appearance. He is not ill-appearing.  HENT:     Head: Normocephalic and atraumatic.     Right Ear: External ear normal.     Left Ear: External ear normal.     Nose: Nose normal. No congestion or rhinorrhea.     Mouth/Throat:     Mouth: Mucous membranes are moist.     Pharynx: Oropharynx is clear.  Eyes:     General: No scleral icterus.    Extraocular Movements: Extraocular movements intact.     Conjunctiva/sclera: Conjunctivae normal.     Pupils: Pupils are equal, round, and reactive to light.  Cardiovascular:     Rate and Rhythm: Normal rate and regular rhythm.     Pulses: Normal pulses.     Heart sounds: Normal heart sounds. No murmur heard. Pulmonary:     Effort: Pulmonary effort is normal.      Breath sounds: Normal breath sounds. No wheezing, rhonchi or rales.  Abdominal:     General: Abdomen is flat. Bowel sounds are normal. There is no distension.     Palpations: Abdomen is soft.     Tenderness: There is no abdominal tenderness.  Musculoskeletal:        General: No swelling or deformity. Normal range of motion.     Cervical back: Normal range of motion.  Skin:    General: Skin is warm and dry.     Capillary Refill: Capillary refill takes less than 2 seconds.  Neurological:  General: No focal deficit present.     Mental Status: He is alert and oriented to person, place, and time.     Motor: No weakness.  Psychiatric:        Mood and Affect: Mood normal.        Behavior: Behavior normal.        Thought Content: Thought content normal.   Last CBC Lab Results  Component Value Date   WBC 7.8 11/07/2023   HGB 16.3 11/25/2023   HGB 16.3 11/25/2023   HCT 48.0 11/25/2023   HCT 48.0 11/25/2023   MCV 92.2 11/07/2023   MCH 31.6 11/07/2023   RDW 13.0 11/07/2023   PLT 125 (L) 11/07/2023   Last metabolic panel Lab Results  Component Value Date   GLUCOSE 103 (H) 11/07/2023   NA 138 11/25/2023   NA 139 11/25/2023   K 4.3 11/25/2023   K 4.3 11/25/2023   CL 97 (L) 11/07/2023   CO2 26 11/07/2023   BUN 28 (H) 11/07/2023   CREATININE 1.39 (H) 11/07/2023   GFRNONAA 57 (L) 11/07/2023   CALCIUM 9.0 11/07/2023   PROT 6.4 (L) 03/22/2023   ALBUMIN 3.7 03/22/2023   LABGLOB 2.6 07/30/2022   AGRATIO 1.6 07/30/2022   BILITOT 1.1 03/22/2023   ALKPHOS 73 03/22/2023   AST 22 03/22/2023   ALT 22 03/22/2023   ANIONGAP 11 11/07/2023   Last lipids Lab Results  Component Value Date   CHOL 141 03/21/2023   HDL 43 03/21/2023   LDLCALC 74 03/21/2023   TRIG 136 03/21/2023   CHOLHDL 3.3 03/21/2023   Last hemoglobin A1c Lab Results  Component Value Date   HGBA1C 7.2 (H) 10/09/2023   Last thyroid functions Lab Results  Component Value Date   TSH 1.370 09/25/2023   Last  vitamin D Lab Results  Component Value Date   VD25OH 16.2 (L) 09/02/2023   Last vitamin B12 and Folate Lab Results  Component Value Date   VITAMINB12 860 09/02/2023   The 10-year ASCVD risk score (Arnett DK, et al., 2019) is: 22.6%    Assessment & Plan:   Problem List Items Addressed This Visit       HFrEF (heart failure with reduced ejection fraction) (HCC) (Chronic)   Feeling weak on exam today.  On appropriate GDMT.  Closely followed by the advanced HF clinic.  Recently underwent RHC and reports that he we will be moving towards transplant after completion of radiation.  Follow-up with the advanced HF clinic is scheduled for later this week (2/20).      Paroxysmal atrial fibrillation (HCC) - Primary   Regular rate and rhythm detected on exam today.  He remains on carvedilol and Eliquis.      Obstructive sleep apnea   Moderate OSA.  He endorses intermittent compliance with CPAP.  The importance of nightly compliance was reinforced today.      Diabetes mellitus type 2 in nonobese (HCC)   A1c 7.2 on labs from December.  He remains on metformin and [place].  Repeat A1c at follow-up in 3 months.      Stage 3a chronic kidney disease (HCC)   CKD 3A.  Stable on labs from last month.  Remains on SGLT2 and Arni.      Malignant neoplasm of prostate [facility])   Recently diagnosed stage IIb prostate cancer.  He has established care with radiation oncology and reports that he will soon start radiation treatment.      Vitamin D deficiency   Noted  on recent labs.  Recommend starting daily vitamin D supplementation.       Return in about 3 months (around 02/29/2024).    Billie Lade, MD

## 2023-12-02 NOTE — Progress Notes (Signed)
HPI James [name] returns today for followup. He is a pleasant 63 yo man with chronic systolic heart failure, EF 20%, non-ischemic CM and tobacco abuse. He is s/p ICD insertion for primary prevention. He denies chest pain or sob. He has now stopped smoking!  No palpitations. Recent right heart cath shows a reduced cardiac output and mildly increased right heart pressures.   No Known Allergies   Current Outpatient Medications  Medication Sig Dispense Refill   acetaminophen (TYLENOL) 500 MG tablet Take 1,000 mg by mouth every 6 (six) hours as needed for moderate pain.     albuterol (VENTOLIN HFA) 108 (90 Base) MCG/ACT inhaler INHALE 2 PUFFS BY MOUTH FOUR TIMES DAILY AS NEEDED 6.7 g 1   apixaban (ELIQUIS) 5 MG TABS tablet Take 1 tablet (5 mg total) by mouth 2 (two) times daily. 60 tablet 11   aspirin EC 81 MG tablet Take 81 mg by mouth daily. Swallow whole.     atorvastatin (LIPITOR) 40 MG tablet TAKE 1 TABLET(40 MG) BY MOUTH DAILY 90 tablet 3   carvedilol (COREG) 6.25 MG tablet Take 1 tablet (6.25 mg total) by mouth 2 (two) times daily with a meal. 60 tablet 1   dapagliflozin propanediol (FARXIGA) 10 MG TABS tablet Take 1 tablet (10 mg total) by mouth daily before breakfast. 90 tablet 3   ENTRESTO 97-103 MG TAKE 1 TABLET BY MOUTH TWICE DAILY 60 tablet 11   metFORMIN (GLUCOPHAGE) 500 MG tablet TAKE 1 TABLET BY MOUTH TWICE DAILY 180 tablet 1   spironolactone (ALDACTONE) 25 MG tablet TAKE 1 TABLET(25 MG) BY MOUTH DAILY 90 tablet 3   torsemide (DEMADEX) 20 MG tablet Take 2 tablets (40 mg total) by mouth daily. 60 tablet 2   meclizine (ANTIVERT) 25 MG tablet Take 1 tablet (25 mg total) by mouth 3 (three) times daily as needed for dizziness. 30 tablet 0   No current facility-administered medications for this visit.   Facility-Administered Medications Ordered in Other Visits  Medication Dose Route Frequency Provider Last Rate Last Admin   sodium chloride flush (NS) 0.9 % injection 3 mL  3 mL  Intravenous Q12H Sabharwal, Aditya, DO         Past Medical History:  Diagnosis Date   CKD (chronic kidney disease) stage 2, GFR 60-89 ml/min    COPD (chronic obstructive pulmonary disease) (HCC)    Diabetes mellitus type II, non insulin dependent (HCC)    Dilated aortic root (HCC)    Elevated PSA    Hypercholesteremia    Hypertension    NICM (nonischemic cardiomyopathy) (HCC)    NSVT (nonsustained ventricular tachycardia) (HCC)    Obstructive sleep apnea    Paroxysmal atrial fibrillation (HCC)    Pulmonary hypertension (HCC)    PVC's (premature ventricular contractions)    Tobacco abuse     ROS:   All systems reviewed and negative except as noted in the HPI.   Past Surgical History:  Procedure Laterality Date   CARDIAC CATHETERIZATION  10/15/2000   COLONOSCOPY N/A 03/22/2014   Procedure: COLONOSCOPY;  Surgeon: West Bali, MD;  Location: [facility];  Service: Endoscopy;  Laterality: N/A;  11:15 AM   ICD IMPLANT N/A 08/21/2021   Procedure: ICD IMPLANT;  Surgeon: Marinus Maw, MD;  Location: [facility] INVASIVE CV LAB;  Service: Cardiovascular;  Laterality: N/A;   INGUINAL HERNIA REPAIR Right 06/24/2017   Procedure: HERNIA REPAIR INGUINAL ADULT WITH MESH;  Surgeon: Franky Macho, MD;  Location: [facility]  ORS;  Service: General;  Laterality: Right;   PROSTATE BIOPSY     RIGHT HEART CATH N/A 11/13/2022   Procedure: RIGHT HEART CATH;  Surgeon: Dorthula Nettles, DO;  [facility]: [facility] INVASIVE CV LAB;  Service: Cardiovascular;  Laterality: N/A;   RIGHT HEART CATH N/A 12/21/2022   Procedure: RIGHT HEART CATH;  Surgeon: Dorthula Nettles, DO;  Location: [facility] INVASIVE CV LAB;  Service: Cardiovascular;  Laterality: N/A;   RIGHT HEART CATH N/A 11/25/2023   Procedure: RIGHT HEART CATH;  Surgeon: Dorthula Nettles, DO;  Location: [facility] INVASIVE CV LAB;  Service: Cardiovascular;  Laterality: N/A;   RIGHT/LEFT HEART CATH AND CORONARY ANGIOGRAPHY N/A 07/19/2021   Procedure: RIGHT/LEFT HEART CATH AND  CORONARY ANGIOGRAPHY;  Surgeon: Kathleene Hazel, MD;  Location: [facility] INVASIVE CV LAB;  Service: Cardiovascular;  Laterality: N/A;   TEE WITHOUT CARDIOVERSION N/A 12/21/2022   Procedure: TRANSESOPHAGEAL ECHOCARDIOGRAM (TEE);  Surgeon: Dorthula Nettles, DO;  Location: [facility] ENDOSCOPY;  Service: Cardiovascular;  Laterality: N/A;     Family History  Problem Relation Age of Onset   Coronary artery disease Mother    Diabetes Father    Heart attack Father    Hypertension Father    Iron deficiency Father    Thyroid disease Sister    Cardiomyopathy Brother        No significant coronary disease by coronary angiography   Heart disease Maternal Grandfather        Also paternal grandfather   Sudden death Other    Colon cancer Neg Hx      Social History   Socioeconomic History   Marital status: Married    Spouse name: Verbon Giangregorio   Number of children: 4   Years of education: Not on file   Highest education level: Associate degree: occupational, Scientist, product/process development, or vocational program  Occupational History   Occupation: Merchandiser, retail    Comment: [employer]  Tobacco Use   Smoking status: Former    Current packs/day: 0.00    Average packs/day: 0.5 packs/day for 38.8 years (19.4 ttl pk-yrs)    Types: Cigarettes    Start date: 10/15/1982    Quit date: 07/18/2021    Years since quitting: 2.3   Smokeless tobacco: Never   Tobacco comments:    3 cigaretts a day  Vaping Use   Vaping status: Never Used  Substance and Sexual Activity   Alcohol use: No    Alcohol/week: 0.0 standard drinks of alcohol   Drug use: No   Sexual activity: Yes    Birth control/protection: None  Other Topics Concern   Not on file  Social History Narrative   Divorced   No regular exercise   Social Drivers of Health   Financial Resource Strain: Low Risk  (07/21/2021)   Overall Financial Resource Strain (CARDIA)    Difficulty of Paying Living Expenses: Not very hard  Food Insecurity: No Food Insecurity  (11/18/2023)   Hunger Vital Sign    Worried About Running Out of Food in the Last Year: Never true    Ran Out of Food in the Last Year: Never true  Transportation Needs: No Transportation Needs (11/18/2023)   PRAPARE - Administrator, Civil Service (Medical): No    Lack of Transportation (Non-Medical): No  Physical Activity: Not on file  Stress: Not on file  Social Connections: Not on file  Intimate Partner Violence: Not At Risk (11/18/2023)   Humiliation, Afraid, Rape, and Kick questionnaire    Fear of Current or Ex-Partner:  No    Emotionally Abused: No    Physically Abused: No    Sexually Abused: No     BP (!) 88/64   Pulse 72   Ht 6\' 6"  (1.981 m)   Wt 249 lb 9.6 oz (113.2 kg)   SpO2 96%   BMI 28.84 kg/m   Physical Exam:  Well appearing NAD HEENT: Unremarkable Neck:  No JVD, no thyromegally Lymphatics:  No adenopathy Back:  No CVA tenderness Lungs:  Clear with no wheezes HEART:  Regular rate rhythm, no murmurs, no rubs, no clicks Abd:  soft, positive bowel sounds, no organomegally, no rebound, no guarding Ext:  2 plus pulses, no edema, no cyanosis, no clubbing Skin:  No rashes no nodules Neuro:  CN II through XII intact, motor grossly intact  DEVICE  Normal device function.  See PaceArt for details.   Assess/Plan:  Chronic systolic heart failure - his symptoms are well compensated and class 2. He will continue GDMT. Tobacco abuse - he is in remission.  ICD - his Biotronik VDD ICD is working normally.  Atrial fib/flutter - He is asymptomatic but has had up to an hour at a time. He will continue eliquis.    James Gowda Shontell Prosser,MD

## 2023-12-03 ENCOUNTER — Telehealth (HOSPITAL_COMMUNITY): Payer: Self-pay | Admitting: Cardiology

## 2023-12-03 NOTE — Telephone Encounter (Signed)
Called patient at [phone] to reschedule appt on Thursday 12/05/23 orginally scheduled at 9:00 AM.  Unable to reach patient directly, so front office staff called this patients work number and left a voice mail for this patient to call back to reschedule due to inclement weather.

## 2023-12-04 ENCOUNTER — Telehealth (HOSPITAL_COMMUNITY): Payer: Self-pay | Admitting: Cardiology

## 2023-12-04 DIAGNOSIS — G4733 Obstructive sleep apnea (adult) (pediatric): Secondary | ICD-10-CM | POA: Diagnosis not present

## 2023-12-04 NOTE — Telephone Encounter (Signed)
Called patient at [phone] to remind patient of his appointment on Thursday 12/05/23 at 11:00 AM.   Front office staff confirmed appt with patient over the telephone - - patient confirmed appointment on 12/04/23.

## 2023-12-05 ENCOUNTER — Telehealth: Payer: Self-pay | Admitting: Internal Medicine

## 2023-12-05 ENCOUNTER — Encounter (HOSPITAL_COMMUNITY): Payer: BC Managed Care – PPO | Admitting: Cardiology

## 2023-12-05 ENCOUNTER — Other Ambulatory Visit: Payer: Self-pay | Admitting: Urology

## 2023-12-05 NOTE — Telephone Encounter (Signed)
Patient with diagnosis of afib on Eliquis for anticoagulation.    Procedure: Doylene Canning Seed Implant/Space Oar instillation  Date of procedure: 12/27/23   CHA2DS2-VASc Score = 3   This indicates a 3.2% annual risk of stroke. The patient's score is based upon: CHF History: 1 HTN History: 1 Diabetes History: 1 Stroke History: 0 Vascular Disease History: 0 Age Score: 0 Gender Score: 0   CrCl 89 mL/min Platelet count 125 K    Per office protocol, patient can hold Eliquis for 2 days prior to procedure.     **This guidance is not considered finalized until pre-operative APP has relayed final recommendations.**

## 2023-12-05 NOTE — Telephone Encounter (Signed)
Pt has appt 12/19/23 with Dr. Eden Emms. Per preop app defer to MD for clearance.

## 2023-12-05 NOTE — Telephone Encounter (Signed)
   Pre-operative Risk Assessment    Patient Name: James [name]  DOB: 22-Mar-1961 MRN: [id number]   Date of last office visit: 12/02/23 Dr. Ladona Ridgel  Date of next office visit: 12/19/23 W/ Dr. Eden Emms    Request for Surgical Clearance    Procedure:   [name] Implant/ Space Oar Instillation   Date of Surgery:  Clearance 12/27/23                                Surgeon:  Dr. Glenford Peers Group or Practice Name:  [facility]  Phone number:  [phone]  Fax number:  [phone]    Type of Clearance Requested:   - Medical  - Pharmacy:  Hold Apixaban (Eliquis) 2 days prior    Type of Anesthesia:  MAC   Additional requests/questions:    James [name]   12/05/2023, 12:01 PM

## 2023-12-05 NOTE — Telephone Encounter (Signed)
Pharmacy please advise on holding Eliquis for 2 days prior to Gold Seed Implant/Space Oar instillation, scheduled for 12/27/2023. Thank you.

## 2023-12-05 NOTE — Telephone Encounter (Signed)
   Name: James [name]  DOB: 08-May-1961  MRN: [id number]  Primary Cardiologist: Charlton Haws, MD   Preoperative team, please contact this patient and set up a phone call appointment for further preoperative risk assessment. Please obtain consent and complete medication review. Thank you for your help. Last seen by Dr. Eden Emms on 10/14/2023  I confirm that guidance regarding antiplatelet and oral anticoagulation therapy has been completed and, if necessary, noted below.  Per office protocol, patient can hold Eliquis for 2 days prior to procedure.    I also confirmed the patient resides in the state of [place]. As per [facility] Medical Board telemedicine laws, the patient must reside in the state in which the provider is licensed.   Joni Reining, NP 12/05/2023, 2:32 PM [facility]

## 2023-12-06 DIAGNOSIS — M79674 Pain in right toe(s): Secondary | ICD-10-CM | POA: Diagnosis not present

## 2023-12-06 DIAGNOSIS — I480 Paroxysmal atrial fibrillation: Secondary | ICD-10-CM | POA: Diagnosis not present

## 2023-12-06 DIAGNOSIS — I428 Other cardiomyopathies: Secondary | ICD-10-CM | POA: Diagnosis not present

## 2023-12-06 DIAGNOSIS — C61 Malignant neoplasm of prostate: Secondary | ICD-10-CM | POA: Diagnosis not present

## 2023-12-06 DIAGNOSIS — I130 Hypertensive heart and chronic kidney disease with heart failure and stage 1 through stage 4 chronic kidney disease, or unspecified chronic kidney disease: Secondary | ICD-10-CM | POA: Diagnosis not present

## 2023-12-06 DIAGNOSIS — Z510 Encounter for antineoplastic radiation therapy: Secondary | ICD-10-CM | POA: Diagnosis not present

## 2023-12-06 DIAGNOSIS — L565 Disseminated superficial actinic porokeratosis (DSAP): Secondary | ICD-10-CM | POA: Diagnosis not present

## 2023-12-06 DIAGNOSIS — M79671 Pain in right foot: Secondary | ICD-10-CM | POA: Diagnosis not present

## 2023-12-06 DIAGNOSIS — E7800 Pure hypercholesterolemia, unspecified: Secondary | ICD-10-CM | POA: Diagnosis not present

## 2023-12-06 DIAGNOSIS — M79672 Pain in left foot: Secondary | ICD-10-CM | POA: Diagnosis not present

## 2023-12-06 DIAGNOSIS — I509 Heart failure, unspecified: Secondary | ICD-10-CM | POA: Diagnosis not present

## 2023-12-06 DIAGNOSIS — N183 Chronic kidney disease, stage 3 unspecified: Secondary | ICD-10-CM | POA: Diagnosis not present

## 2023-12-06 DIAGNOSIS — Z87891 Personal history of nicotine dependence: Secondary | ICD-10-CM | POA: Diagnosis not present

## 2023-12-06 DIAGNOSIS — E1122 Type 2 diabetes mellitus with diabetic chronic kidney disease: Secondary | ICD-10-CM | POA: Diagnosis not present

## 2023-12-06 DIAGNOSIS — J449 Chronic obstructive pulmonary disease, unspecified: Secondary | ICD-10-CM | POA: Diagnosis not present

## 2023-12-10 DIAGNOSIS — C61 Malignant neoplasm of prostate: Secondary | ICD-10-CM | POA: Diagnosis not present

## 2023-12-10 NOTE — Progress Notes (Signed)
 Referring Physician: Billie Lade, MD  Primary Care: Billie Lade, MD Primary Cardiologist: Charlton Haws, MD CHF:  Gasper Lloyd  HPI: James [name] is a 63 y.o. male with nonischemic cardiomyopathy s/p ICD, HTNm, T2DM, aortic root aneurysm presenting today for follow-up James [name] HF dates back to 2011 when he was diagnosed with nonischemic cardiomyopathy.  According to Mr. Hair he came to the [facility] system in 2013 when he had a left heart cath with no coronary disease with a EF of 20%, he was started on low-dose GDMT at that time.  By 2013 his EF improved to 45%.  2022 he once again had a reduction in LVEF to 20 to 25% patient was started on Entresto With a repeat heart cath demonstrating no significant coronary disease.  For the past 1 year despite the addition of GDMT, patient has had progressively worsening exercise capacity with no improvement in LV function.  Admitted again 09/2023 with CHF and TTE with EF 20%  Seen in CHF clinic  His CPX is suggestive of severe heart failure limitation and multiple right heart catheterizations demonstrate persistently reduced cardiac index less than 2 L/min/m.  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  Needs seed implant 12/27/23 Ok to hold eliquis 2 days prior Has prostate cancer with PSA 7.4 stage T1cGleason score 3/4  I told him to talk with urology as he has also been marked for external beam radiation and I am not use to seeing people get both seeds and external beam  Past Medical History:  Diagnosis Date   CKD (chronic kidney disease) stage 2, GFR 60-89 ml/min    COPD (chronic obstructive pulmonary disease) (HCC)    Diabetes mellitus type II, non insulin dependent (HCC)    Dilated aortic root (HCC)    Elevated PSA    Hypercholesteremia    Hypertension    NICM (nonischemic cardiomyopathy) (HCC)    NSVT (nonsustained ventricular  tachycardia) (HCC)    Obstructive sleep apnea    Paroxysmal atrial fibrillation (HCC)    Pulmonary hypertension (HCC)    PVC's (premature ventricular contractions)    Tobacco abuse     Current Outpatient Medications  Medication Sig Dispense Refill   acetaminophen (TYLENOL) 500 MG tablet Take 1,000 mg by mouth every 6 (six) hours as needed for moderate pain.     albuterol (VENTOLIN HFA) 108 (90 Base) MCG/ACT inhaler INHALE 2 PUFFS BY MOUTH FOUR TIMES DAILY AS NEEDED 6.7 g 1   apixaban (ELIQUIS) 5 MG TABS tablet Take 1 tablet (5 mg total) by mouth 2 (two) times daily. 60 tablet 11   aspirin EC 81 MG tablet Take 81 mg by mouth daily. Swallow whole.     atorvastatin (LIPITOR) 40 MG tablet TAKE 1 TABLET(40 MG) BY MOUTH DAILY 90 tablet 3   carvedilol (COREG) 6.25 MG tablet Take 1 tablet (6.25 mg total) by mouth 2 (two) times daily with a meal. 60 tablet 1   dapagliflozin propanediol (FARXIGA) 10 MG TABS tablet Take 1 tablet (10 mg total) by mouth daily before breakfast. 90 tablet 3   ENTRESTO 97-103 MG TAKE 1 TABLET BY MOUTH TWICE DAILY 60 tablet 11   meclizine (ANTIVERT) 25 MG tablet Take 1 tablet (25 mg total) by mouth 3 (three) times daily as needed for dizziness. 30 tablet 0   metFORMIN (GLUCOPHAGE) 500 MG tablet TAKE 1 TABLET BY  MOUTH TWICE DAILY 180 tablet 1   spironolactone (ALDACTONE) 25 MG tablet TAKE 1 TABLET(25 MG) BY MOUTH DAILY 90 tablet 3   torsemide (DEMADEX) 20 MG tablet Take 2 tablets (40 mg total) by mouth daily. 60 tablet 2   No current facility-administered medications for this visit.   Facility-Administered Medications Ordered in Other Visits  Medication Dose Route Frequency Provider Last Rate Last Admin   sodium chloride flush (NS) 0.9 % injection 3 mL  3 mL Intravenous Q12H Sabharwal, Aditya, DO        No Known Allergies    Social History   Socioeconomic History   Marital status: Married    Spouse name: James [name]   Number of children: 4   Years of  education: Not on file   Highest education level: Associate degree: occupational, Scientist, product/process development, or vocational program  Occupational History   Occupation: Merchandiser, retail    Comment: [employer]  Tobacco Use   Smoking status: Former    Current packs/day: 0.00    Average packs/day: 0.5 packs/day for 38.8 years (19.4 ttl pk-yrs)    Types: Cigarettes    Start date: 10/15/1982    Quit date: 07/18/2021    Years since quitting: 2.4   Smokeless tobacco: Never   Tobacco comments:    3 cigaretts a day  Vaping Use   Vaping status: Never Used  Substance and Sexual Activity   Alcohol use: No    Alcohol/week: 0.0 standard drinks of alcohol   Drug use: No   Sexual activity: Yes    Birth control/protection: None  Other Topics Concern   Not on file  Social History Narrative   Divorced   No regular exercise   Social Drivers of Health   Financial Resource Strain: Low Risk  (07/21/2021)   Overall Financial Resource Strain (CARDIA)    Difficulty of Paying Living Expenses: Not very hard  Food Insecurity: No Food Insecurity (11/18/2023)   Hunger Vital Sign    Worried About Running Out of Food in the Last Year: Never true    Ran Out of Food in the Last Year: Never true  Transportation Needs: No Transportation Needs (11/18/2023)   PRAPARE - Administrator, Civil Service (Medical): No    Lack of Transportation (Non-Medical): No  Physical Activity: Not on file  Stress: Not on file  Social Connections: Not on file  Intimate Partner Violence: Not At Risk (11/18/2023)   Humiliation, Afraid, Rape, and Kick questionnaire    Fear of Current or Ex-Partner: No    Emotionally Abused: No    Physically Abused: No    Sexually Abused: No      Family History  Problem Relation Age of Onset   Coronary artery disease Mother    Diabetes Father    Heart attack Father    Hypertension Father    Iron deficiency Father    Thyroid disease Sister    Cardiomyopathy Brother        No significant coronary disease  by coronary angiography   Heart disease Maternal Grandfather        Also paternal grandfather   Sudden death Other    Colon cancer Neg Hx     PHYSICAL EXAM: Vitals:   12/19/23 1024  BP: 138/68  Pulse: 68  SpO2: 96%    Affect appropriate Healthy:  appears stated age HEENT: normal Neck supple with no adenopathy JVP normal no bruits no thyromegaly Lungs clear with no wheezing and good diaphragmatic motion  Heart:  S1/S2 no murmur, no rub, gallop or click PMI enlarged  Abdomen: benighn, BS positve, no tenderness, no AAA no bruit.  No HSM or HJR Distal pulses intact with no bruits No edema Neuro non-focal Skin warm and dry No muscular weakness .    DATA REVIEW  ECG: Normal sinus rhythm  ECHO: 07/23/2022: LVEF 20 to 25%, severely dilated, RV function moderately reduced, LA severely dilated, mild MR. 10/22/22: LVEF 25-30% 12/21/22 (TEE): LVEF 20-25%, RV function mildly reduced. Aneurysmal aortic root at [facility].  10/11/23: LVEF 20%, moderately reduced RV function   CATH: RHC/LHC (07/19/21): No angiographic evidence of CAD Elevated right and left heart pressures (LV: 130/22/39, RA 15, RV 82/13/20, PA 83/30 mean 49, PCWP 35).   RHC: 12/21/22: HEMODYNAMICS: RA:                  8 mmHg (mean) RV:                  83/7 mmHg PA:                  79/34 mmHg (51 mean) PCWP:            25 mmHg (mean)                                      Estimated Fick CO/CI   3.3 L/min, 1.3 L/min/m2         Thermodilution CO/CI   4.9 L/min, 1.9 L/min/m2 NIBP: 125/88                                      TPG                 26  mmHg                                            PVR                 5.3-8.8 Wood Units  PAPi                5.6       ASSESSMENT & PLAN:  CHF:   euvolemic NIDCM. Continue high dose entresto, lasix, coreg adlactone and [place]. Has primary prevention AICD in place Right heart 11/25/23 with cardiac index 1.7 by Fick and 2.2 by thermodilution. PCWP 17 PA 43/19 mmHg F/U with Dr  Gasper Lloyd regarding advanced Rx;s   2. CKD - repeat labs today with sCr of 1.39; BNP down to 460 from 4400.   3. Aortic root aneurysm - 5cm by echo in 10/23.  4. Paroxysmal atrial fibrillation/flutter - continue apixaban 5mg  BID  5. OSA - AHI 30.6 - CPAP  6. Prostate Cancer - hold eliquis 2 days before - ok to have seed implant - 12/27/23 - Needs to discuss with urology why he needs both external/internal radiation as I have  Not seen this before     F/U CHF Clinic Sabharwal latter this month already arranged   Charlton Haws MD [facility]

## 2023-12-13 ENCOUNTER — Other Ambulatory Visit: Payer: Self-pay

## 2023-12-13 MED ORDER — APIXABAN 5 MG PO TABS: 5.0000 mg | ORAL_TABLET | Freq: Two times a day (BID) | ORAL | 11 refills | Status: DC

## 2023-12-13 NOTE — Telephone Encounter (Signed)
 Prescription refill request for Eliquis received. Indication:afib Last office visit:2/25 Scr:1.39  1/25 Age: 63 Weight:113.2  kg  Prescription refilled

## 2023-12-16 DIAGNOSIS — Z510 Encounter for antineoplastic radiation therapy: Secondary | ICD-10-CM | POA: Diagnosis not present

## 2023-12-16 DIAGNOSIS — Z87891 Personal history of nicotine dependence: Secondary | ICD-10-CM | POA: Diagnosis not present

## 2023-12-16 DIAGNOSIS — E1122 Type 2 diabetes mellitus with diabetic chronic kidney disease: Secondary | ICD-10-CM | POA: Diagnosis not present

## 2023-12-16 DIAGNOSIS — J449 Chronic obstructive pulmonary disease, unspecified: Secondary | ICD-10-CM | POA: Diagnosis not present

## 2023-12-16 DIAGNOSIS — I480 Paroxysmal atrial fibrillation: Secondary | ICD-10-CM | POA: Diagnosis not present

## 2023-12-16 DIAGNOSIS — I130 Hypertensive heart and chronic kidney disease with heart failure and stage 1 through stage 4 chronic kidney disease, or unspecified chronic kidney disease: Secondary | ICD-10-CM | POA: Diagnosis not present

## 2023-12-16 DIAGNOSIS — E7800 Pure hypercholesterolemia, unspecified: Secondary | ICD-10-CM | POA: Diagnosis not present

## 2023-12-16 DIAGNOSIS — I509 Heart failure, unspecified: Secondary | ICD-10-CM | POA: Diagnosis not present

## 2023-12-16 DIAGNOSIS — N183 Chronic kidney disease, stage 3 unspecified: Secondary | ICD-10-CM | POA: Diagnosis not present

## 2023-12-16 DIAGNOSIS — I428 Other cardiomyopathies: Secondary | ICD-10-CM | POA: Diagnosis not present

## 2023-12-16 DIAGNOSIS — C61 Malignant neoplasm of prostate: Secondary | ICD-10-CM | POA: Diagnosis not present

## 2023-12-19 ENCOUNTER — Encounter: Payer: Self-pay | Admitting: Cardiovascular Disease

## 2023-12-19 ENCOUNTER — Ambulatory Visit: Payer: BC Managed Care – PPO | Attending: Cardiovascular Disease | Admitting: Cardiovascular Disease

## 2023-12-19 VITALS — BP 138/68 | HR 68 | Ht 78.0 in | Wt 255.8 lb

## 2023-12-19 DIAGNOSIS — Z01810 Encounter for preprocedural cardiovascular examination: Secondary | ICD-10-CM | POA: Diagnosis not present

## 2023-12-19 DIAGNOSIS — Z7901 Long term (current) use of anticoagulants: Secondary | ICD-10-CM

## 2023-12-19 DIAGNOSIS — I428 Other cardiomyopathies: Secondary | ICD-10-CM

## 2023-12-19 DIAGNOSIS — R972 Elevated prostate specific antigen [PSA]: Secondary | ICD-10-CM | POA: Diagnosis not present

## 2023-12-19 NOTE — Patient Instructions (Signed)
 Medication Instructions:  Your physician recommends that you continue on your current medications as directed. Please refer to the Current Medication list given to you today.  *If you need a refill on your cardiac medications before your next appointment, please call your pharmacy*   Lab Work: NONE   If you have labs (blood work) drawn today and your tests are completely normal, you will receive your results only by: MyChart Message (if you have MyChart) OR A paper copy in the mail If you have any lab test that is abnormal or we need to change your treatment, we will call you to review the results.   Testing/Procedures: NONE    Follow-Up: At [facility], you and your health needs are our priority.  As part of our continuing mission to provide you with exceptional heart care, we have created designated Provider Care Teams.  These Care Teams include your primary Cardiologist (physician) and Advanced Practice Providers (APPs -  Physician Assistants and Nurse Practitioners) who all work together to provide you with the care you need, when you need it.  We recommend signing up for the patient portal called "MyChart".  Sign up information is provided on this After Visit Summary.  MyChart is used to connect with patients for Virtual Visits (Telemedicine).  Patients are able to view lab/test results, encounter notes, upcoming appointments, etc.  Non-urgent messages can be sent to your provider as well.   To learn more about what you can do with MyChart, go to [email or web address].    Your next appointment:    To Be Determined by CHF clinic   Provider:   You may see Charlton Haws, MD or one of the following Advanced Practice Providers on your designated Care Team:   Randall An, PA-C  Jacolyn Reedy, PA-C     Other Instructions Thank you for choosing [facility]!

## 2023-12-20 ENCOUNTER — Encounter (HOSPITAL_COMMUNITY): Payer: Self-pay | Admitting: Anesthesiology

## 2023-12-20 ENCOUNTER — Telehealth: Payer: Self-pay

## 2023-12-20 DIAGNOSIS — E7800 Pure hypercholesterolemia, unspecified: Secondary | ICD-10-CM | POA: Diagnosis not present

## 2023-12-20 DIAGNOSIS — I428 Other cardiomyopathies: Secondary | ICD-10-CM | POA: Diagnosis not present

## 2023-12-20 DIAGNOSIS — Z87891 Personal history of nicotine dependence: Secondary | ICD-10-CM | POA: Diagnosis not present

## 2023-12-20 DIAGNOSIS — N183 Chronic kidney disease, stage 3 unspecified: Secondary | ICD-10-CM | POA: Diagnosis not present

## 2023-12-20 DIAGNOSIS — I480 Paroxysmal atrial fibrillation: Secondary | ICD-10-CM | POA: Diagnosis not present

## 2023-12-20 DIAGNOSIS — E1122 Type 2 diabetes mellitus with diabetic chronic kidney disease: Secondary | ICD-10-CM | POA: Diagnosis not present

## 2023-12-20 DIAGNOSIS — I509 Heart failure, unspecified: Secondary | ICD-10-CM | POA: Diagnosis not present

## 2023-12-20 DIAGNOSIS — C61 Malignant neoplasm of prostate: Secondary | ICD-10-CM | POA: Diagnosis not present

## 2023-12-20 DIAGNOSIS — I130 Hypertensive heart and chronic kidney disease with heart failure and stage 1 through stage 4 chronic kidney disease, or unspecified chronic kidney disease: Secondary | ICD-10-CM | POA: Diagnosis not present

## 2023-12-20 DIAGNOSIS — J449 Chronic obstructive pulmonary disease, unspecified: Secondary | ICD-10-CM | POA: Diagnosis not present

## 2023-12-20 DIAGNOSIS — Z510 Encounter for antineoplastic radiation therapy: Secondary | ICD-10-CM | POA: Diagnosis not present

## 2023-12-20 NOTE — Progress Notes (Signed)
 Orders requested for ICD through PAT device clinic pool 12/20/23.

## 2023-12-20 NOTE — Telephone Encounter (Signed)
 Dr. Retta Diones wants to start patient on Orgovyx.  Patient informed that he will need to come by the office for his rx and sign the Orgovyx support form.  Patient agreed to come by Monday 12/23/2023

## 2023-12-23 DIAGNOSIS — I428 Other cardiomyopathies: Secondary | ICD-10-CM | POA: Diagnosis not present

## 2023-12-23 DIAGNOSIS — Z87891 Personal history of nicotine dependence: Secondary | ICD-10-CM | POA: Diagnosis not present

## 2023-12-23 DIAGNOSIS — Z510 Encounter for antineoplastic radiation therapy: Secondary | ICD-10-CM | POA: Diagnosis not present

## 2023-12-23 DIAGNOSIS — C61 Malignant neoplasm of prostate: Secondary | ICD-10-CM | POA: Diagnosis not present

## 2023-12-23 DIAGNOSIS — I130 Hypertensive heart and chronic kidney disease with heart failure and stage 1 through stage 4 chronic kidney disease, or unspecified chronic kidney disease: Secondary | ICD-10-CM | POA: Diagnosis not present

## 2023-12-23 DIAGNOSIS — J449 Chronic obstructive pulmonary disease, unspecified: Secondary | ICD-10-CM | POA: Diagnosis not present

## 2023-12-23 DIAGNOSIS — I509 Heart failure, unspecified: Secondary | ICD-10-CM | POA: Diagnosis not present

## 2023-12-23 DIAGNOSIS — E1122 Type 2 diabetes mellitus with diabetic chronic kidney disease: Secondary | ICD-10-CM | POA: Diagnosis not present

## 2023-12-23 DIAGNOSIS — E7800 Pure hypercholesterolemia, unspecified: Secondary | ICD-10-CM | POA: Diagnosis not present

## 2023-12-23 DIAGNOSIS — I480 Paroxysmal atrial fibrillation: Secondary | ICD-10-CM | POA: Diagnosis not present

## 2023-12-23 DIAGNOSIS — N183 Chronic kidney disease, stage 3 unspecified: Secondary | ICD-10-CM | POA: Diagnosis not present

## 2023-12-24 ENCOUNTER — Telehealth: Payer: Self-pay

## 2023-12-24 DIAGNOSIS — Z87891 Personal history of nicotine dependence: Secondary | ICD-10-CM | POA: Diagnosis not present

## 2023-12-24 DIAGNOSIS — J449 Chronic obstructive pulmonary disease, unspecified: Secondary | ICD-10-CM | POA: Diagnosis not present

## 2023-12-24 DIAGNOSIS — I130 Hypertensive heart and chronic kidney disease with heart failure and stage 1 through stage 4 chronic kidney disease, or unspecified chronic kidney disease: Secondary | ICD-10-CM | POA: Diagnosis not present

## 2023-12-24 DIAGNOSIS — Z510 Encounter for antineoplastic radiation therapy: Secondary | ICD-10-CM | POA: Diagnosis not present

## 2023-12-24 DIAGNOSIS — C61 Malignant neoplasm of prostate: Secondary | ICD-10-CM | POA: Diagnosis not present

## 2023-12-24 DIAGNOSIS — I480 Paroxysmal atrial fibrillation: Secondary | ICD-10-CM | POA: Diagnosis not present

## 2023-12-24 DIAGNOSIS — E1122 Type 2 diabetes mellitus with diabetic chronic kidney disease: Secondary | ICD-10-CM | POA: Diagnosis not present

## 2023-12-24 DIAGNOSIS — E7800 Pure hypercholesterolemia, unspecified: Secondary | ICD-10-CM | POA: Diagnosis not present

## 2023-12-24 DIAGNOSIS — I428 Other cardiomyopathies: Secondary | ICD-10-CM | POA: Diagnosis not present

## 2023-12-24 DIAGNOSIS — N183 Chronic kidney disease, stage 3 unspecified: Secondary | ICD-10-CM | POA: Diagnosis not present

## 2023-12-24 DIAGNOSIS — I509 Heart failure, unspecified: Secondary | ICD-10-CM | POA: Diagnosis not present

## 2023-12-24 NOTE — Telephone Encounter (Signed)
 [place] with [facility] cancer center called to let our office know pt's spaceoar procedure needed to be canceled for 03/13 because he is in active treatment.  I informed her that Dr. Cardell Peach was performing the procedure and provided her with the office number.  She will reach out to Dr. Dillard Essex office, patient was notified that procedure would be canceled.

## 2023-12-25 DIAGNOSIS — I509 Heart failure, unspecified: Secondary | ICD-10-CM | POA: Diagnosis not present

## 2023-12-25 DIAGNOSIS — J449 Chronic obstructive pulmonary disease, unspecified: Secondary | ICD-10-CM | POA: Diagnosis not present

## 2023-12-25 DIAGNOSIS — E7800 Pure hypercholesterolemia, unspecified: Secondary | ICD-10-CM | POA: Diagnosis not present

## 2023-12-25 DIAGNOSIS — N183 Chronic kidney disease, stage 3 unspecified: Secondary | ICD-10-CM | POA: Diagnosis not present

## 2023-12-25 DIAGNOSIS — I480 Paroxysmal atrial fibrillation: Secondary | ICD-10-CM | POA: Diagnosis not present

## 2023-12-25 DIAGNOSIS — I428 Other cardiomyopathies: Secondary | ICD-10-CM | POA: Diagnosis not present

## 2023-12-25 DIAGNOSIS — C61 Malignant neoplasm of prostate: Secondary | ICD-10-CM | POA: Diagnosis not present

## 2023-12-25 DIAGNOSIS — E1122 Type 2 diabetes mellitus with diabetic chronic kidney disease: Secondary | ICD-10-CM | POA: Diagnosis not present

## 2023-12-25 DIAGNOSIS — I130 Hypertensive heart and chronic kidney disease with heart failure and stage 1 through stage 4 chronic kidney disease, or unspecified chronic kidney disease: Secondary | ICD-10-CM | POA: Diagnosis not present

## 2023-12-25 DIAGNOSIS — Z510 Encounter for antineoplastic radiation therapy: Secondary | ICD-10-CM | POA: Diagnosis not present

## 2023-12-25 DIAGNOSIS — Z87891 Personal history of nicotine dependence: Secondary | ICD-10-CM | POA: Diagnosis not present

## 2023-12-26 DIAGNOSIS — Z510 Encounter for antineoplastic radiation therapy: Secondary | ICD-10-CM | POA: Diagnosis not present

## 2023-12-26 DIAGNOSIS — E7800 Pure hypercholesterolemia, unspecified: Secondary | ICD-10-CM | POA: Diagnosis not present

## 2023-12-26 DIAGNOSIS — I428 Other cardiomyopathies: Secondary | ICD-10-CM | POA: Diagnosis not present

## 2023-12-26 DIAGNOSIS — N183 Chronic kidney disease, stage 3 unspecified: Secondary | ICD-10-CM | POA: Diagnosis not present

## 2023-12-26 DIAGNOSIS — C61 Malignant neoplasm of prostate: Secondary | ICD-10-CM | POA: Diagnosis not present

## 2023-12-26 DIAGNOSIS — I480 Paroxysmal atrial fibrillation: Secondary | ICD-10-CM | POA: Diagnosis not present

## 2023-12-26 DIAGNOSIS — I509 Heart failure, unspecified: Secondary | ICD-10-CM | POA: Diagnosis not present

## 2023-12-26 DIAGNOSIS — J449 Chronic obstructive pulmonary disease, unspecified: Secondary | ICD-10-CM | POA: Diagnosis not present

## 2023-12-26 DIAGNOSIS — I130 Hypertensive heart and chronic kidney disease with heart failure and stage 1 through stage 4 chronic kidney disease, or unspecified chronic kidney disease: Secondary | ICD-10-CM | POA: Diagnosis not present

## 2023-12-26 DIAGNOSIS — Z87891 Personal history of nicotine dependence: Secondary | ICD-10-CM | POA: Diagnosis not present

## 2023-12-26 DIAGNOSIS — E1122 Type 2 diabetes mellitus with diabetic chronic kidney disease: Secondary | ICD-10-CM | POA: Diagnosis not present

## 2023-12-27 ENCOUNTER — Ambulatory Visit (HOSPITAL_COMMUNITY): Admission: RE | Admit: 2023-12-27 | Payer: BC Managed Care – PPO | Source: Home / Self Care | Admitting: Urology

## 2023-12-27 DIAGNOSIS — I480 Paroxysmal atrial fibrillation: Secondary | ICD-10-CM | POA: Diagnosis not present

## 2023-12-27 DIAGNOSIS — N183 Chronic kidney disease, stage 3 unspecified: Secondary | ICD-10-CM | POA: Diagnosis not present

## 2023-12-27 DIAGNOSIS — Z510 Encounter for antineoplastic radiation therapy: Secondary | ICD-10-CM | POA: Diagnosis not present

## 2023-12-27 DIAGNOSIS — E7800 Pure hypercholesterolemia, unspecified: Secondary | ICD-10-CM | POA: Diagnosis not present

## 2023-12-27 DIAGNOSIS — C61 Malignant neoplasm of prostate: Secondary | ICD-10-CM | POA: Diagnosis not present

## 2023-12-27 DIAGNOSIS — I130 Hypertensive heart and chronic kidney disease with heart failure and stage 1 through stage 4 chronic kidney disease, or unspecified chronic kidney disease: Secondary | ICD-10-CM | POA: Diagnosis not present

## 2023-12-27 DIAGNOSIS — I428 Other cardiomyopathies: Secondary | ICD-10-CM | POA: Diagnosis not present

## 2023-12-27 DIAGNOSIS — I509 Heart failure, unspecified: Secondary | ICD-10-CM | POA: Diagnosis not present

## 2023-12-27 DIAGNOSIS — Z87891 Personal history of nicotine dependence: Secondary | ICD-10-CM | POA: Diagnosis not present

## 2023-12-27 DIAGNOSIS — E1122 Type 2 diabetes mellitus with diabetic chronic kidney disease: Secondary | ICD-10-CM | POA: Diagnosis not present

## 2023-12-27 DIAGNOSIS — J449 Chronic obstructive pulmonary disease, unspecified: Secondary | ICD-10-CM | POA: Diagnosis not present

## 2023-12-27 SURGERY — INSERTION, GOLD SEEDS
Anesthesia: Monitor Anesthesia Care

## 2023-12-30 DIAGNOSIS — E7800 Pure hypercholesterolemia, unspecified: Secondary | ICD-10-CM | POA: Diagnosis not present

## 2023-12-30 DIAGNOSIS — Z510 Encounter for antineoplastic radiation therapy: Secondary | ICD-10-CM | POA: Diagnosis not present

## 2023-12-30 DIAGNOSIS — I509 Heart failure, unspecified: Secondary | ICD-10-CM | POA: Diagnosis not present

## 2023-12-30 DIAGNOSIS — C61 Malignant neoplasm of prostate: Secondary | ICD-10-CM | POA: Diagnosis not present

## 2023-12-30 DIAGNOSIS — J449 Chronic obstructive pulmonary disease, unspecified: Secondary | ICD-10-CM | POA: Diagnosis not present

## 2023-12-30 DIAGNOSIS — E1122 Type 2 diabetes mellitus with diabetic chronic kidney disease: Secondary | ICD-10-CM | POA: Diagnosis not present

## 2023-12-30 DIAGNOSIS — Z87891 Personal history of nicotine dependence: Secondary | ICD-10-CM | POA: Diagnosis not present

## 2023-12-30 DIAGNOSIS — I428 Other cardiomyopathies: Secondary | ICD-10-CM | POA: Diagnosis not present

## 2023-12-30 DIAGNOSIS — N183 Chronic kidney disease, stage 3 unspecified: Secondary | ICD-10-CM | POA: Diagnosis not present

## 2023-12-30 DIAGNOSIS — I130 Hypertensive heart and chronic kidney disease with heart failure and stage 1 through stage 4 chronic kidney disease, or unspecified chronic kidney disease: Secondary | ICD-10-CM | POA: Diagnosis not present

## 2023-12-30 DIAGNOSIS — I480 Paroxysmal atrial fibrillation: Secondary | ICD-10-CM | POA: Diagnosis not present

## 2023-12-31 DIAGNOSIS — I428 Other cardiomyopathies: Secondary | ICD-10-CM | POA: Diagnosis not present

## 2023-12-31 DIAGNOSIS — I509 Heart failure, unspecified: Secondary | ICD-10-CM | POA: Diagnosis not present

## 2023-12-31 DIAGNOSIS — E7800 Pure hypercholesterolemia, unspecified: Secondary | ICD-10-CM | POA: Diagnosis not present

## 2023-12-31 DIAGNOSIS — N183 Chronic kidney disease, stage 3 unspecified: Secondary | ICD-10-CM | POA: Diagnosis not present

## 2023-12-31 DIAGNOSIS — Z87891 Personal history of nicotine dependence: Secondary | ICD-10-CM | POA: Diagnosis not present

## 2023-12-31 DIAGNOSIS — I480 Paroxysmal atrial fibrillation: Secondary | ICD-10-CM | POA: Diagnosis not present

## 2023-12-31 DIAGNOSIS — Z510 Encounter for antineoplastic radiation therapy: Secondary | ICD-10-CM | POA: Diagnosis not present

## 2023-12-31 DIAGNOSIS — C61 Malignant neoplasm of prostate: Secondary | ICD-10-CM | POA: Diagnosis not present

## 2023-12-31 DIAGNOSIS — E1122 Type 2 diabetes mellitus with diabetic chronic kidney disease: Secondary | ICD-10-CM | POA: Diagnosis not present

## 2023-12-31 DIAGNOSIS — I130 Hypertensive heart and chronic kidney disease with heart failure and stage 1 through stage 4 chronic kidney disease, or unspecified chronic kidney disease: Secondary | ICD-10-CM | POA: Diagnosis not present

## 2023-12-31 DIAGNOSIS — J449 Chronic obstructive pulmonary disease, unspecified: Secondary | ICD-10-CM | POA: Diagnosis not present

## 2024-01-01 DIAGNOSIS — I428 Other cardiomyopathies: Secondary | ICD-10-CM | POA: Diagnosis not present

## 2024-01-01 DIAGNOSIS — I509 Heart failure, unspecified: Secondary | ICD-10-CM | POA: Diagnosis not present

## 2024-01-01 DIAGNOSIS — J449 Chronic obstructive pulmonary disease, unspecified: Secondary | ICD-10-CM | POA: Diagnosis not present

## 2024-01-01 DIAGNOSIS — I480 Paroxysmal atrial fibrillation: Secondary | ICD-10-CM | POA: Diagnosis not present

## 2024-01-01 DIAGNOSIS — N183 Chronic kidney disease, stage 3 unspecified: Secondary | ICD-10-CM | POA: Diagnosis not present

## 2024-01-01 DIAGNOSIS — E7800 Pure hypercholesterolemia, unspecified: Secondary | ICD-10-CM | POA: Diagnosis not present

## 2024-01-01 DIAGNOSIS — Z510 Encounter for antineoplastic radiation therapy: Secondary | ICD-10-CM | POA: Diagnosis not present

## 2024-01-01 DIAGNOSIS — G4733 Obstructive sleep apnea (adult) (pediatric): Secondary | ICD-10-CM | POA: Diagnosis not present

## 2024-01-01 DIAGNOSIS — I130 Hypertensive heart and chronic kidney disease with heart failure and stage 1 through stage 4 chronic kidney disease, or unspecified chronic kidney disease: Secondary | ICD-10-CM | POA: Diagnosis not present

## 2024-01-01 DIAGNOSIS — E1122 Type 2 diabetes mellitus with diabetic chronic kidney disease: Secondary | ICD-10-CM | POA: Diagnosis not present

## 2024-01-01 DIAGNOSIS — C61 Malignant neoplasm of prostate: Secondary | ICD-10-CM | POA: Diagnosis not present

## 2024-01-01 DIAGNOSIS — Z87891 Personal history of nicotine dependence: Secondary | ICD-10-CM | POA: Diagnosis not present

## 2024-01-02 ENCOUNTER — Ambulatory Visit: Payer: BC Managed Care – PPO | Admitting: Radiation Oncology

## 2024-01-02 DIAGNOSIS — Z510 Encounter for antineoplastic radiation therapy: Secondary | ICD-10-CM | POA: Diagnosis not present

## 2024-01-02 DIAGNOSIS — I130 Hypertensive heart and chronic kidney disease with heart failure and stage 1 through stage 4 chronic kidney disease, or unspecified chronic kidney disease: Secondary | ICD-10-CM | POA: Diagnosis not present

## 2024-01-02 DIAGNOSIS — J449 Chronic obstructive pulmonary disease, unspecified: Secondary | ICD-10-CM | POA: Diagnosis not present

## 2024-01-02 DIAGNOSIS — I509 Heart failure, unspecified: Secondary | ICD-10-CM | POA: Diagnosis not present

## 2024-01-02 DIAGNOSIS — Z87891 Personal history of nicotine dependence: Secondary | ICD-10-CM | POA: Diagnosis not present

## 2024-01-02 DIAGNOSIS — N183 Chronic kidney disease, stage 3 unspecified: Secondary | ICD-10-CM | POA: Diagnosis not present

## 2024-01-02 DIAGNOSIS — E1122 Type 2 diabetes mellitus with diabetic chronic kidney disease: Secondary | ICD-10-CM | POA: Diagnosis not present

## 2024-01-02 DIAGNOSIS — I428 Other cardiomyopathies: Secondary | ICD-10-CM | POA: Diagnosis not present

## 2024-01-02 DIAGNOSIS — I480 Paroxysmal atrial fibrillation: Secondary | ICD-10-CM | POA: Diagnosis not present

## 2024-01-02 DIAGNOSIS — E7800 Pure hypercholesterolemia, unspecified: Secondary | ICD-10-CM | POA: Diagnosis not present

## 2024-01-02 DIAGNOSIS — C61 Malignant neoplasm of prostate: Secondary | ICD-10-CM | POA: Diagnosis not present

## 2024-01-03 DIAGNOSIS — I480 Paroxysmal atrial fibrillation: Secondary | ICD-10-CM | POA: Diagnosis not present

## 2024-01-03 DIAGNOSIS — C61 Malignant neoplasm of prostate: Secondary | ICD-10-CM | POA: Diagnosis not present

## 2024-01-03 DIAGNOSIS — E1122 Type 2 diabetes mellitus with diabetic chronic kidney disease: Secondary | ICD-10-CM | POA: Diagnosis not present

## 2024-01-03 DIAGNOSIS — N183 Chronic kidney disease, stage 3 unspecified: Secondary | ICD-10-CM | POA: Diagnosis not present

## 2024-01-03 DIAGNOSIS — E7800 Pure hypercholesterolemia, unspecified: Secondary | ICD-10-CM | POA: Diagnosis not present

## 2024-01-03 DIAGNOSIS — I130 Hypertensive heart and chronic kidney disease with heart failure and stage 1 through stage 4 chronic kidney disease, or unspecified chronic kidney disease: Secondary | ICD-10-CM | POA: Diagnosis not present

## 2024-01-03 DIAGNOSIS — I428 Other cardiomyopathies: Secondary | ICD-10-CM | POA: Diagnosis not present

## 2024-01-03 DIAGNOSIS — J449 Chronic obstructive pulmonary disease, unspecified: Secondary | ICD-10-CM | POA: Diagnosis not present

## 2024-01-03 DIAGNOSIS — I509 Heart failure, unspecified: Secondary | ICD-10-CM | POA: Diagnosis not present

## 2024-01-03 DIAGNOSIS — Z510 Encounter for antineoplastic radiation therapy: Secondary | ICD-10-CM | POA: Diagnosis not present

## 2024-01-03 DIAGNOSIS — Z87891 Personal history of nicotine dependence: Secondary | ICD-10-CM | POA: Diagnosis not present

## 2024-01-06 ENCOUNTER — Telehealth (HOSPITAL_COMMUNITY): Payer: Self-pay

## 2024-01-06 DIAGNOSIS — I428 Other cardiomyopathies: Secondary | ICD-10-CM | POA: Diagnosis not present

## 2024-01-06 DIAGNOSIS — J449 Chronic obstructive pulmonary disease, unspecified: Secondary | ICD-10-CM | POA: Diagnosis not present

## 2024-01-06 DIAGNOSIS — C61 Malignant neoplasm of prostate: Secondary | ICD-10-CM | POA: Diagnosis not present

## 2024-01-06 DIAGNOSIS — I480 Paroxysmal atrial fibrillation: Secondary | ICD-10-CM | POA: Diagnosis not present

## 2024-01-06 DIAGNOSIS — I130 Hypertensive heart and chronic kidney disease with heart failure and stage 1 through stage 4 chronic kidney disease, or unspecified chronic kidney disease: Secondary | ICD-10-CM | POA: Diagnosis not present

## 2024-01-06 DIAGNOSIS — Z87891 Personal history of nicotine dependence: Secondary | ICD-10-CM | POA: Diagnosis not present

## 2024-01-06 DIAGNOSIS — N183 Chronic kidney disease, stage 3 unspecified: Secondary | ICD-10-CM | POA: Diagnosis not present

## 2024-01-06 DIAGNOSIS — Z510 Encounter for antineoplastic radiation therapy: Secondary | ICD-10-CM | POA: Diagnosis not present

## 2024-01-06 DIAGNOSIS — I509 Heart failure, unspecified: Secondary | ICD-10-CM | POA: Diagnosis not present

## 2024-01-06 DIAGNOSIS — E1122 Type 2 diabetes mellitus with diabetic chronic kidney disease: Secondary | ICD-10-CM | POA: Diagnosis not present

## 2024-01-06 DIAGNOSIS — E7800 Pure hypercholesterolemia, unspecified: Secondary | ICD-10-CM | POA: Diagnosis not present

## 2024-01-06 NOTE — Telephone Encounter (Signed)
 Called to confirm/remind patient of their appointment at the [facility] on 01/07/2024.   Appointment:   [] Confirmed  [] Left mess   [x] No answer/No voice mail  [] Phone not in service

## 2024-01-07 ENCOUNTER — Ambulatory Visit: Admitting: Urology

## 2024-01-07 ENCOUNTER — Encounter (HOSPITAL_COMMUNITY): Payer: Self-pay

## 2024-01-07 ENCOUNTER — Encounter (HOSPITAL_COMMUNITY): Payer: Self-pay | Admitting: *Deleted

## 2024-01-07 ENCOUNTER — Ambulatory Visit (HOSPITAL_COMMUNITY)
Admission: RE | Admit: 2024-01-07 | Discharge: 2024-01-07 | Disposition: A | Discharge status: Home / Self Care | Payer: BC Managed Care – PPO | Source: Ambulatory Visit | Attending: Family Medicine | Admitting: Family Medicine

## 2024-01-07 VITALS — BP 80/54 | HR 66 | Wt 256.2 lb

## 2024-01-07 DIAGNOSIS — E1122 Type 2 diabetes mellitus with diabetic chronic kidney disease: Secondary | ICD-10-CM | POA: Diagnosis not present

## 2024-01-07 DIAGNOSIS — Z7984 Long term (current) use of oral hypoglycemic drugs: Secondary | ICD-10-CM | POA: Insufficient documentation

## 2024-01-07 DIAGNOSIS — I5020 Unspecified systolic (congestive) heart failure: Secondary | ICD-10-CM

## 2024-01-07 DIAGNOSIS — I428 Other cardiomyopathies: Secondary | ICD-10-CM | POA: Insufficient documentation

## 2024-01-07 DIAGNOSIS — Z95810 Presence of automatic (implantable) cardiac defibrillator: Secondary | ICD-10-CM | POA: Diagnosis not present

## 2024-01-07 DIAGNOSIS — J449 Chronic obstructive pulmonary disease, unspecified: Secondary | ICD-10-CM | POA: Diagnosis not present

## 2024-01-07 DIAGNOSIS — Z79899 Other long term (current) drug therapy: Secondary | ICD-10-CM | POA: Insufficient documentation

## 2024-01-07 DIAGNOSIS — Z510 Encounter for antineoplastic radiation therapy: Secondary | ICD-10-CM | POA: Diagnosis not present

## 2024-01-07 DIAGNOSIS — Z7901 Long term (current) use of anticoagulants: Secondary | ICD-10-CM | POA: Diagnosis not present

## 2024-01-07 DIAGNOSIS — E7800 Pure hypercholesterolemia, unspecified: Secondary | ICD-10-CM | POA: Diagnosis not present

## 2024-01-07 DIAGNOSIS — I4892 Unspecified atrial flutter: Secondary | ICD-10-CM | POA: Diagnosis not present

## 2024-01-07 DIAGNOSIS — I480 Paroxysmal atrial fibrillation: Secondary | ICD-10-CM | POA: Diagnosis not present

## 2024-01-07 DIAGNOSIS — Q2543 Congenital aneurysm of aorta: Secondary | ICD-10-CM | POA: Diagnosis not present

## 2024-01-07 DIAGNOSIS — I5022 Chronic systolic (congestive) heart failure: Secondary | ICD-10-CM | POA: Diagnosis not present

## 2024-01-07 DIAGNOSIS — C61 Malignant neoplasm of prostate: Secondary | ICD-10-CM | POA: Diagnosis not present

## 2024-01-07 DIAGNOSIS — N1831 Chronic kidney disease, stage 3a: Secondary | ICD-10-CM

## 2024-01-07 DIAGNOSIS — R42 Dizziness and giddiness: Secondary | ICD-10-CM | POA: Diagnosis not present

## 2024-01-07 DIAGNOSIS — I130 Hypertensive heart and chronic kidney disease with heart failure and stage 1 through stage 4 chronic kidney disease, or unspecified chronic kidney disease: Secondary | ICD-10-CM | POA: Diagnosis not present

## 2024-01-07 DIAGNOSIS — N182 Chronic kidney disease, stage 2 (mild): Secondary | ICD-10-CM | POA: Diagnosis not present

## 2024-01-07 DIAGNOSIS — N183 Chronic kidney disease, stage 3 unspecified: Secondary | ICD-10-CM | POA: Diagnosis not present

## 2024-01-07 DIAGNOSIS — Z87891 Personal history of nicotine dependence: Secondary | ICD-10-CM | POA: Insufficient documentation

## 2024-01-07 DIAGNOSIS — I509 Heart failure, unspecified: Secondary | ICD-10-CM | POA: Diagnosis not present

## 2024-01-07 LAB — BASIC METABOLIC PANEL
Anion gap: 10 (ref 5–15)
BUN: 30 mg/dL — ABNORMAL HIGH (ref 8–23)
CO2: 25 mmol/L (ref 22–32)
Calcium: 9 mg/dL (ref 8.9–10.3)
Chloride: 101 mmol/L (ref 98–111)
Creatinine, Ser: 1.35 mg/dL — ABNORMAL HIGH (ref 0.61–1.24)
GFR, Estimated: 59 mL/min — ABNORMAL LOW (ref >60)
Glucose, Bld: 118 mg/dL — ABNORMAL HIGH (ref 70–99)
Potassium: 4.2 mmol/L (ref 3.5–5.1)
Sodium: 136 mmol/L (ref 135–145)

## 2024-01-07 LAB — BRAIN NATRIURETIC PEPTIDE: B Natriuretic Peptide: 270.5 pg/mL — ABNORMAL HIGH (ref 0.0–100.0)

## 2024-01-07 MED ORDER — CARVEDILOL 3.125 MG PO TABS: 3.1250 mg | ORAL_TABLET | Freq: Two times a day (BID) | ORAL | 5 refills | Status: DC

## 2024-01-07 MED ORDER — SPIRONOLACTONE 25 MG PO TABS: 25.0000 mg | ORAL_TABLET | Freq: Every day | ORAL | 3 refills | Status: DC

## 2024-01-07 NOTE — Progress Notes (Signed)
 ReDS Vest / Clip - 01/07/24 1400       ReDS Vest / Clip   Station Marker D    Ruler Value 35    ReDS Value Range Low volume    ReDS Actual Value 33

## 2024-01-07 NOTE — Progress Notes (Addendum)
 ADVANCED HEART FAILURE CLINIC NOTE  Primary Care: Billie Lade, MD Primary Cardiologist: Charlton Haws, MD HF Cardiologist: Dorthula Nettles, MD  HPI: James [name] is a 63 y.o. male with nonischemic cardiomyopathy s/p ICD, HTNm, T2DM, aortic root aneurysm. Mr. Blackerby HF dates back to 2011 when he was diagnosed with nonischemic cardiomyopathy.  According to Mr. Miles he came to the [facility] system in 2013 when he had a left heart cath with no coronary disease with a EF of 20%, he was started on low-dose GDMT at that time.  By 2013 his EF improved to 45%.  He followed yearly with [name] admission until 2022 when he once again had a reduction in LVEF to 20 to 25% patient was started on Entresto With a repeat heart cath demonstrating no significant coronary disease.  For the past 1 year despite the addition of GDMT, patient has had progressively worsening exercise capacity with no improvement in LV function.  Referred to heart failure for further evaluation.  Since establishing care with [name], Mr. Stripling has undergone evaluation for advanced therapies.  His CPX is suggestive of severe heart failure limitation and multiple right heart catheterizations demonstrate persistently reduced cardiac index less than 2 L/min/m.  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  Today he returns for HF follow up with his wife. Overall feeling fine. Only complaint is dizziness, no falls. Denies palpitations, abnormal bleeding, CP, dizziness, edema, or PND/Orthopnea. Appetite ok. No fever or chills. Weight at home stable. Taking all medications. Undergoing radiation for prostate cancer. Remains quit from smoking.  Activity level/exercise tolerance: NYHA I Orthopnea: Sleeps on 2 pillows Paroxysmal noctural dyspnea: No Chest pain/pressure: No Orthostatic lightheadedness: Yes Palpitations: No Lower extremity  edema: No Presyncope/syncope: No Cough: No  Past Medical History:  Diagnosis Date   CKD (chronic kidney disease) stage 2, GFR 60-89 ml/min    COPD (chronic obstructive pulmonary disease) (HCC)    Diabetes mellitus type II, non insulin dependent (HCC)    Dilated aortic root (HCC)    Elevated PSA    Hypercholesteremia    Hypertension    NICM (nonischemic cardiomyopathy) (HCC)    NSVT (nonsustained ventricular tachycardia) (HCC)    Obstructive sleep apnea    Paroxysmal atrial fibrillation (HCC)    Pulmonary hypertension (HCC)    PVC's (premature ventricular contractions)    Tobacco abuse    Current Outpatient Medications  Medication Sig Dispense Refill   acetaminophen (TYLENOL) 500 MG tablet Take 1,000 mg by mouth every 6 (six) hours as needed for moderate pain.     albuterol (VENTOLIN HFA) 108 (90 Base) MCG/ACT inhaler INHALE 2 PUFFS BY MOUTH FOUR TIMES DAILY AS NEEDED 6.7 g 1   apixaban (ELIQUIS) 5 MG TABS tablet Take 1 tablet (5 mg total) by mouth 2 (two) times daily. 60 tablet 11   aspirin EC 81 MG tablet Take 81 mg by mouth daily. Swallow whole.     atorvastatin (LIPITOR) 40 MG tablet TAKE 1 TABLET(40 MG) BY MOUTH DAILY 90 tablet 3   carvedilol (COREG) 6.25 MG tablet Take 1 tablet (6.25 mg total) by mouth 2 (two) times daily with a meal. 60 tablet 1   dapagliflozin propanediol (FARXIGA) 10 MG TABS tablet Take 1 tablet (10 mg total) by mouth daily before breakfast. 90 tablet 3   ENTRESTO 97-103 MG TAKE 1 TABLET BY MOUTH TWICE DAILY 60 tablet  11   meclizine (ANTIVERT) 25 MG tablet Take 1 tablet (25 mg total) by mouth 3 (three) times daily as needed for dizziness. 30 tablet 0   metFORMIN (GLUCOPHAGE) 500 MG tablet TAKE 1 TABLET BY MOUTH TWICE DAILY 180 tablet 1   spironolactone (ALDACTONE) 25 MG tablet TAKE 1 TABLET(25 MG) BY MOUTH DAILY 90 tablet 3   torsemide (DEMADEX) 20 MG tablet Take 2 tablets (40 mg total) by mouth daily. 60 tablet 2   No current facility-administered  medications for this encounter.   Facility-Administered Medications Ordered in Other Encounters  Medication Dose Route Frequency Provider Last Rate Last Admin   sodium chloride flush (NS) 0.9 % injection 3 mL  3 mL Intravenous Q12H Sabharwal, Aditya, DO       No Known Allergies   Social History   Socioeconomic History   Marital status: Married    Spouse name: Adonai Selsor   Number of children: 4   Years of education: Not on file   Highest education level: Associate degree: occupational, Scientist, product/process development, or vocational program  Occupational History   Occupation: Merchandiser, retail    Comment: [employer]  Tobacco Use   Smoking status: Former    Current packs/day: 0.00    Average packs/day: 0.5 packs/day for 38.8 years (19.4 ttl pk-yrs)    Types: Cigarettes    Start date: 10/15/1982    Quit date: 07/18/2021    Years since quitting: 2.4   Smokeless tobacco: Never   Tobacco comments:    3 cigaretts a day  Vaping Use   Vaping status: Never Used  Substance and Sexual Activity   Alcohol use: No    Alcohol/week: 0.0 standard drinks of alcohol   Drug use: No   Sexual activity: Yes    Birth control/protection: None  Other Topics Concern   Not on file  Social History Narrative   Divorced   No regular exercise   Social Drivers of Health   Financial Resource Strain: Low Risk  (07/21/2021)   Overall Financial Resource Strain (CARDIA)    Difficulty of Paying Living Expenses: Not very hard  Food Insecurity: No Food Insecurity (11/18/2023)   Hunger Vital Sign    Worried About Running Out of Food in the Last Year: Never true    Ran Out of Food in the Last Year: Never true  Transportation Needs: No Transportation Needs (11/18/2023)   PRAPARE - Administrator, Civil Service (Medical): No    Lack of Transportation (Non-Medical): No  Physical Activity: Not on file  Stress: Not on file  Social Connections: Not on file  Intimate Partner Violence: Not At Risk (11/18/2023)   Humiliation,  Afraid, Rape, and Kick questionnaire    Fear of Current or Ex-Partner: No    Emotionally Abused: No    Physically Abused: No    Sexually Abused: No    Family History  Problem Relation Age of Onset   Coronary artery disease Mother    Diabetes Father    Heart attack Father    Hypertension Father    Iron deficiency Father    Thyroid disease Sister    Cardiomyopathy Brother        No significant coronary disease by coronary angiography   Heart disease Maternal Grandfather        Also paternal grandfather   Sudden death Other    Colon cancer Neg Hx    Wt Readings from Last 3 Encounters:  01/07/24 116.2 kg (256 lb 3.2 oz)  12/19/23 116 kg (255 lb 12.8 oz)  12/02/23 113.2 kg (249 lb 9.6 oz)   BP (!) 80/54   Pulse 66   Wt 116.2 kg (256 lb 3.2 oz)   SpO2 97%   BMI 29.61 kg/m   PHYSICAL EXAM: General:  NAD. No resp difficulty HEENT: Normal Neck: Supple. No JVD. Cor: Regular rate & rhythm. No rubs, gallops or murmurs. Lungs: Clear Abdomen: Soft, nontender, nondistended.  Extremities: No cyanosis, clubbing, rash, edema Neuro: Alert & oriented x 3, moves all 4 extremities w/o difficulty. Affect pleasant.  DATA REVIEW  ReDs reading: 33%, normal  ECG (personally reviewed): NSR with PACs, 68 bpm.   ECHO: 07/23/2022: LVEF 20 to 25%, severely dilated, RV function moderately reduced, LA severely dilated, mild MR. 10/22/22: LVEF 25-30% 12/21/22 (TEE): LVEF 20-25%, RV function mildly reduced. [facility] at [facility].  10/11/23: LVEF 20%, moderately reduced RV function   CATH: RHC/LHC (07/19/21): No angiographic evidence of CAD Elevated right and left heart pressures (LV: 130/22/39, RA 15, RV 82/13/20, PA 83/30 mean 49, PCWP 35).   RHC: 12/21/22: HEMODYNAMICS: RA:                  8 mmHg (mean) RV:                  83/7 mmHg PA:                  79/34 mmHg (51 mean) PCWP:            25 mmHg (mean)                                      Estimated Fick CO/CI   3.3 L/min, 1.3  L/min/m2         Thermodilution CO/CI   4.9 L/min, 1.9 L/min/m2 NIBP: 125/88                                      TPG                 26  mmHg                                            PVR                 5.3-8.8 Wood Units  PAPi                5.6    ASSESSMENT & PLAN:  NYHA Class I-II nonischemic cardiomyopathy Etiology of HF: Nonischemic cardiomyopathy likely idiopathic dilated. However cannot rule out infiltrative disease. NYHA class / AHA Stage: NYHA I-II Volume status & Diuretics: Euvolemic on exam. Continue taking torsemide 40 mg daily. BMET/BNP today. Vasodilators: Continue Entresto 97/103 mg bid.  Beta-Blocker: With low BP, decrease Coreg to 3.125 mg bid. Instructed to hold all antihypertensives and avoid driving today. Given Rx for BP cuff and asked him to notify clinic if SBP consistently < 90. MRA: Continue spironolactone 25 mg but move to at bedtime. Cardiometabolic: Continue Farxiga 10 mg daily. Devices therapies & Valvulopathies: Primary prevention ICD in place. Advanced therapies: Right heart catheterization and CPX consistent with stage D heart failure with severe heart  failure limitations.  However, from a subjective standpoint he still feels fairly well with no PND, lower extremity edema. He does not feel limited due to shortness of breath.  At this time we will follow him closely clinically if he does have any decompensation plan for advanced therapies. He has completed most of his LVAD evaluation and has been discussed at [facility].  - 11/06/22: EF remains severely reduced with now one hospitalization for HFrEF exacerbation. Plan on repeat RHC to re-evaluate for transplant/LVAD.   2. CKD - Continue SGLT2i. BMET today as above.  3. Aortic root aneurysm - 5 cm by echo in 10/23.  - Stable.   4. Paroxysmal atrial fibrillation/flutter - NSR on ECG today. - Continue Eliquis 5 mg bid.  Follow up in 2 weeks with PharmD (BP & med check), and in 2 months with Dr.  Gasper Lloyd.  Prince Rome, FNP-BC 01/07/24

## 2024-01-07 NOTE — Patient Instructions (Signed)
 Medication Changes:  DECREASE CARVEDILOL TO 3.125MG  TWICE DAILY   TAKE SPIRONOLACTONE 25MG  ONCE DAILY AT BEDTIME  DO NOT TAKE ANY BLOOD PRESSURE MEDICATIONS TODAY  CHECK YOUR BLOOD PRESSURE DAILY AT HOME- WRITE THIS NUMBER DOWN- PLEASE CALL OUR OFFICE [phone] OPT 2 FOR BLOOD PRESSURES LOWER THAN 90 ON THE TOP NUMBER  NO DRIVING TODAY   Lab Work:  Labs done today, your results will be available in MyChart, we will contact you for abnormal readings.  Follow-Up in: 2 WEEKS AS SCHEDULED WITH PHARMD   AND THEN AGAIN IN 2 MONTHS WITH DR. Gasper Lloyd AS SCHEDULED   At the [facility], you and your health needs are our priority. We have a designated team specialized in the treatment of Heart Failure. This Care Team includes your primary Heart Failure Specialized Cardiologist (physician), Advanced Practice Providers (APPs- Physician Assistants and Nurse Practitioners), and Pharmacist who all work together to provide you with the care you need, when you need it.   You may see any of the following providers on your designated Care Team at your next follow up:  Dr. Arvilla Meres Dr. Marca Ancona Dr. Dorthula Nettles Dr. Theresia Bough Tonye Becket, NP Robbie Lis, [facility] Brave, [place] Brynda Peon, NP [name], NP Karle Plumber, PharmD   Please be sure to bring in all your medications bottles to every appointment.   Need to Contact us:  If you have any questions or concerns before your next appointment please send [name] a message through [place] or call our office at [phone].    TO LEAVE A MESSAGE FOR THE NURSE SELECT OPTION 2, PLEASE LEAVE A MESSAGE INCLUDING: YOUR NAME DATE OF BIRTH CALL BACK NUMBER REASON FOR CALL**this is important as we prioritize the call backs  YOU WILL RECEIVE A CALL BACK THE SAME DAY AS LONG AS YOU CALL BEFORE 4:00 PM

## 2024-01-08 DIAGNOSIS — N183 Chronic kidney disease, stage 3 unspecified: Secondary | ICD-10-CM | POA: Diagnosis not present

## 2024-01-08 DIAGNOSIS — I130 Hypertensive heart and chronic kidney disease with heart failure and stage 1 through stage 4 chronic kidney disease, or unspecified chronic kidney disease: Secondary | ICD-10-CM | POA: Diagnosis not present

## 2024-01-08 DIAGNOSIS — I428 Other cardiomyopathies: Secondary | ICD-10-CM | POA: Diagnosis not present

## 2024-01-08 DIAGNOSIS — J449 Chronic obstructive pulmonary disease, unspecified: Secondary | ICD-10-CM | POA: Diagnosis not present

## 2024-01-08 DIAGNOSIS — I480 Paroxysmal atrial fibrillation: Secondary | ICD-10-CM | POA: Diagnosis not present

## 2024-01-08 DIAGNOSIS — Z87891 Personal history of nicotine dependence: Secondary | ICD-10-CM | POA: Diagnosis not present

## 2024-01-08 DIAGNOSIS — E1122 Type 2 diabetes mellitus with diabetic chronic kidney disease: Secondary | ICD-10-CM | POA: Diagnosis not present

## 2024-01-08 DIAGNOSIS — E7800 Pure hypercholesterolemia, unspecified: Secondary | ICD-10-CM | POA: Diagnosis not present

## 2024-01-08 DIAGNOSIS — I509 Heart failure, unspecified: Secondary | ICD-10-CM | POA: Diagnosis not present

## 2024-01-08 DIAGNOSIS — C61 Malignant neoplasm of prostate: Secondary | ICD-10-CM | POA: Diagnosis not present

## 2024-01-08 DIAGNOSIS — Z510 Encounter for antineoplastic radiation therapy: Secondary | ICD-10-CM | POA: Diagnosis not present

## 2024-01-09 DIAGNOSIS — I428 Other cardiomyopathies: Secondary | ICD-10-CM | POA: Diagnosis not present

## 2024-01-09 DIAGNOSIS — J449 Chronic obstructive pulmonary disease, unspecified: Secondary | ICD-10-CM | POA: Diagnosis not present

## 2024-01-09 DIAGNOSIS — N183 Chronic kidney disease, stage 3 unspecified: Secondary | ICD-10-CM | POA: Diagnosis not present

## 2024-01-09 DIAGNOSIS — I509 Heart failure, unspecified: Secondary | ICD-10-CM | POA: Diagnosis not present

## 2024-01-09 DIAGNOSIS — E1122 Type 2 diabetes mellitus with diabetic chronic kidney disease: Secondary | ICD-10-CM | POA: Diagnosis not present

## 2024-01-09 DIAGNOSIS — E7800 Pure hypercholesterolemia, unspecified: Secondary | ICD-10-CM | POA: Diagnosis not present

## 2024-01-09 DIAGNOSIS — C61 Malignant neoplasm of prostate: Secondary | ICD-10-CM | POA: Diagnosis not present

## 2024-01-09 DIAGNOSIS — Z510 Encounter for antineoplastic radiation therapy: Secondary | ICD-10-CM | POA: Diagnosis not present

## 2024-01-09 DIAGNOSIS — I480 Paroxysmal atrial fibrillation: Secondary | ICD-10-CM | POA: Diagnosis not present

## 2024-01-09 DIAGNOSIS — I130 Hypertensive heart and chronic kidney disease with heart failure and stage 1 through stage 4 chronic kidney disease, or unspecified chronic kidney disease: Secondary | ICD-10-CM | POA: Diagnosis not present

## 2024-01-09 DIAGNOSIS — Z87891 Personal history of nicotine dependence: Secondary | ICD-10-CM | POA: Diagnosis not present

## 2024-01-09 NOTE — Progress Notes (Signed)
 Advanced Heart Failure Clinic Note    Primary Care: Billie Lade, MD Primary Cardiologist: Charlton Haws, MD HF Cardiologist: Dorthula Nettles, MD  HPI:  James [name] is a 63 y.o. male with nonischemic cardiomyopathy s/p ICD, HTN, T2DM, aortic root aneurysm. James [name] HF dates back to 2011 when he was diagnosed with nonischemic cardiomyopathy.  According to James [name] he came to the [facility] system in 2013 when he had a left heart cath with no coronary disease with a EF of 20%, he was started on low-dose GDMT at that time.  By 2013 James EF improved to 45%.  He followed yearly with [name] until admission in 2022 when he once again had a reduction in LVEF to 20 to 25%, patient was started on Entresto. A repeat heart cath demonstrating no significant coronary disease.  For the past 1 year despite the addition of GDMT, patient has had progressively worsening exercise capacity with no improvement in LV function.  Referred to heart failure for further evaluation.   Since establishing care with [name], James [name] has undergone evaluation for advanced therapies.  James CPX is suggestive of severe heart failure limitation and multiple right heart catheterizations demonstrate persistently reduced cardiac index less than 2 L/min/m.  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   Returned to [facility] for HF follow up with James [name] 01/07/24. Overall was feeling fine. Only complaint was dizziness, no falls. Denied palpitations, abnormal bleeding, CP, dizziness, edema, or PND/Orthopnea. Appetite was ok. No fever or chills. Weight at home was stable. Sleeps on two pillows, no LEE. Reported taking all medications. Was undergoing radiation for prostate cancer. Remained quit from smoking.  Today he returns to HF clinic for pharmacist medication titration. At last visit with [facility], carvedilol was decreased to  3.125 mg BID from 12.5 mg BID due to HOTN (BP in clinic 80/54) and spironolactone was moved to bedtime administration. Overall he is feeling well today. Says dizziness has completely resolved since medication changes last visit. Did take BP at home this morning and BP was 71/53 (3 hours after morning medications). However, James home BP cuff is a wrist cuff which have low accuracy. James [name] took James BP the other day and BP was 109/70. A nurse at [name] also took James BP and although he could not remember the value, he stated she said it was "low but not too low). No CP or palpitations. No SOB/DOE. He does have fatigue, but attributes this to James radiation treatments, which he does every weekday. He discussed with with his MD and notes joint pain is a very common side effect of the radiation. Weight at home has been stable. Takes torsemide 40 mg daily and has not needed any extra. No LEE, PND or orthopnea. Taking all medications as prescribed and tolerating all medications.     HF Medications: Carvedilol 3.125 mg BID Entresto 97/103 mg BID Spironolactone 25 mg daily Farxiga 10 mg daily Torsemide 40 mg daily  Has the patient been experiencing any side effects to the medications prescribed?  no  Does the patient have any problems obtaining medications due to transportation or finances?  No, has Lobbyist of regimen: good Understanding of indications: good Potential of compliance: good Patient understands to avoid NSAIDs. Patient understands to avoid decongestants.    Pertinent Lab Values: 01/07/24: Serum creatinine  1.35, BUN 30, Potassium 4.2, Sodium 136, BNP 270.5  Vital Signs: Weight: 256 lbs (last clinic weight: 256.2 lbs) Blood pressure: 110/70  Heart rate: 60   Assessment/Plan: NYHA Class I-II nonischemic cardiomyopathy Etiology of HF: Nonischemic cardiomyopathy likely idiopathic dilated. However cannot rule out infiltrative disease. NYHA class / AHA  Stage: NYHA I-II. BP is improved after medication changes last visit and dizziness has completely resolved. Volume status & Diuretics: Euvolemic on exam. Continue taking Torsemide 40 mg daily.  Vasodilators: Continue Entresto 97/103 mg BID.  Beta-Blocker: Continue carvedilol 3.125 mg BID. Recently decreased due to HOTN.  MRA: Continue spironolactone 25 mg at bedtime. Cardiometabolic: Continue Farxiga 10 mg daily. Devices therapies & Valvulopathies: Primary prevention ICD in place. Advanced therapies: Right heart catheterization and CPX consistent with stage D heart failure with severe heart failure limitations.  However, from a subjective standpoint he still feels fairly well with no PND, lower extremity edema. He does not feel limited due to shortness of breath.  At this time we will follow him closely clinically if he does have any decompensation plan for advanced therapies. He has completed most of James LVAD evaluation and has been discussed at [facility].  - 11/06/22: EF remains severely reduced with now one hospitalization for HFrEF exacerbation. Plan on repeat RHC to re-evaluate for transplant/LVAD.  - He has wrist BP cuff at home. Advised him to get an automatic arm cuff as this type is more accurate.    2. CKD - Continue SGLT2i.   3. Aortic root aneurysm - 5 cm by echo in 07/2022.  - Stable.    4. Paroxysmal atrial fibrillation/flutter - NSR on ECG 01/07/24. - Continue Eliquis 5 mg BID.    Follow up 1 month with Dr. Gasper Lloyd.   Karle Plumber, PharmD, BCPS, [facility], CPP Heart Failure Clinic Pharmacist [phone]

## 2024-01-10 DIAGNOSIS — N183 Chronic kidney disease, stage 3 unspecified: Secondary | ICD-10-CM | POA: Diagnosis not present

## 2024-01-10 DIAGNOSIS — I509 Heart failure, unspecified: Secondary | ICD-10-CM | POA: Diagnosis not present

## 2024-01-10 DIAGNOSIS — I130 Hypertensive heart and chronic kidney disease with heart failure and stage 1 through stage 4 chronic kidney disease, or unspecified chronic kidney disease: Secondary | ICD-10-CM | POA: Diagnosis not present

## 2024-01-10 DIAGNOSIS — Z87891 Personal history of nicotine dependence: Secondary | ICD-10-CM | POA: Diagnosis not present

## 2024-01-10 DIAGNOSIS — I428 Other cardiomyopathies: Secondary | ICD-10-CM | POA: Diagnosis not present

## 2024-01-10 DIAGNOSIS — E7800 Pure hypercholesterolemia, unspecified: Secondary | ICD-10-CM | POA: Diagnosis not present

## 2024-01-10 DIAGNOSIS — C61 Malignant neoplasm of prostate: Secondary | ICD-10-CM | POA: Diagnosis not present

## 2024-01-10 DIAGNOSIS — J449 Chronic obstructive pulmonary disease, unspecified: Secondary | ICD-10-CM | POA: Diagnosis not present

## 2024-01-10 DIAGNOSIS — I480 Paroxysmal atrial fibrillation: Secondary | ICD-10-CM | POA: Diagnosis not present

## 2024-01-10 DIAGNOSIS — E1122 Type 2 diabetes mellitus with diabetic chronic kidney disease: Secondary | ICD-10-CM | POA: Diagnosis not present

## 2024-01-10 DIAGNOSIS — Z510 Encounter for antineoplastic radiation therapy: Secondary | ICD-10-CM | POA: Diagnosis not present

## 2024-01-13 ENCOUNTER — Other Ambulatory Visit: Payer: Self-pay | Admitting: Internal Medicine

## 2024-01-13 DIAGNOSIS — E7800 Pure hypercholesterolemia, unspecified: Secondary | ICD-10-CM | POA: Diagnosis not present

## 2024-01-13 DIAGNOSIS — Z87891 Personal history of nicotine dependence: Secondary | ICD-10-CM | POA: Diagnosis not present

## 2024-01-13 DIAGNOSIS — I130 Hypertensive heart and chronic kidney disease with heart failure and stage 1 through stage 4 chronic kidney disease, or unspecified chronic kidney disease: Secondary | ICD-10-CM | POA: Diagnosis not present

## 2024-01-13 DIAGNOSIS — I480 Paroxysmal atrial fibrillation: Secondary | ICD-10-CM | POA: Diagnosis not present

## 2024-01-13 DIAGNOSIS — Z510 Encounter for antineoplastic radiation therapy: Secondary | ICD-10-CM | POA: Diagnosis not present

## 2024-01-13 DIAGNOSIS — C61 Malignant neoplasm of prostate: Secondary | ICD-10-CM | POA: Diagnosis not present

## 2024-01-13 DIAGNOSIS — I428 Other cardiomyopathies: Secondary | ICD-10-CM | POA: Diagnosis not present

## 2024-01-13 DIAGNOSIS — J449 Chronic obstructive pulmonary disease, unspecified: Secondary | ICD-10-CM | POA: Diagnosis not present

## 2024-01-13 DIAGNOSIS — I5020 Unspecified systolic (congestive) heart failure: Secondary | ICD-10-CM

## 2024-01-13 DIAGNOSIS — N183 Chronic kidney disease, stage 3 unspecified: Secondary | ICD-10-CM | POA: Diagnosis not present

## 2024-01-13 DIAGNOSIS — I509 Heart failure, unspecified: Secondary | ICD-10-CM | POA: Diagnosis not present

## 2024-01-13 DIAGNOSIS — E1122 Type 2 diabetes mellitus with diabetic chronic kidney disease: Secondary | ICD-10-CM | POA: Diagnosis not present

## 2024-01-14 ENCOUNTER — Ambulatory Visit: Payer: BC Managed Care – PPO | Admitting: Urology

## 2024-01-14 DIAGNOSIS — E7800 Pure hypercholesterolemia, unspecified: Secondary | ICD-10-CM | POA: Diagnosis not present

## 2024-01-14 DIAGNOSIS — Z87891 Personal history of nicotine dependence: Secondary | ICD-10-CM | POA: Diagnosis not present

## 2024-01-14 DIAGNOSIS — C61 Malignant neoplasm of prostate: Secondary | ICD-10-CM | POA: Diagnosis not present

## 2024-01-14 DIAGNOSIS — I480 Paroxysmal atrial fibrillation: Secondary | ICD-10-CM | POA: Diagnosis not present

## 2024-01-14 DIAGNOSIS — J449 Chronic obstructive pulmonary disease, unspecified: Secondary | ICD-10-CM | POA: Diagnosis not present

## 2024-01-14 DIAGNOSIS — I130 Hypertensive heart and chronic kidney disease with heart failure and stage 1 through stage 4 chronic kidney disease, or unspecified chronic kidney disease: Secondary | ICD-10-CM | POA: Diagnosis not present

## 2024-01-14 DIAGNOSIS — N183 Chronic kidney disease, stage 3 unspecified: Secondary | ICD-10-CM | POA: Diagnosis not present

## 2024-01-14 DIAGNOSIS — I509 Heart failure, unspecified: Secondary | ICD-10-CM | POA: Diagnosis not present

## 2024-01-14 DIAGNOSIS — E1122 Type 2 diabetes mellitus with diabetic chronic kidney disease: Secondary | ICD-10-CM | POA: Diagnosis not present

## 2024-01-14 DIAGNOSIS — I428 Other cardiomyopathies: Secondary | ICD-10-CM | POA: Diagnosis not present

## 2024-01-14 DIAGNOSIS — Z510 Encounter for antineoplastic radiation therapy: Secondary | ICD-10-CM | POA: Diagnosis not present

## 2024-01-15 DIAGNOSIS — N183 Chronic kidney disease, stage 3 unspecified: Secondary | ICD-10-CM | POA: Diagnosis not present

## 2024-01-15 DIAGNOSIS — I428 Other cardiomyopathies: Secondary | ICD-10-CM | POA: Diagnosis not present

## 2024-01-15 DIAGNOSIS — I130 Hypertensive heart and chronic kidney disease with heart failure and stage 1 through stage 4 chronic kidney disease, or unspecified chronic kidney disease: Secondary | ICD-10-CM | POA: Diagnosis not present

## 2024-01-15 DIAGNOSIS — J449 Chronic obstructive pulmonary disease, unspecified: Secondary | ICD-10-CM | POA: Diagnosis not present

## 2024-01-15 DIAGNOSIS — I509 Heart failure, unspecified: Secondary | ICD-10-CM | POA: Diagnosis not present

## 2024-01-15 DIAGNOSIS — Z510 Encounter for antineoplastic radiation therapy: Secondary | ICD-10-CM | POA: Diagnosis not present

## 2024-01-15 DIAGNOSIS — I480 Paroxysmal atrial fibrillation: Secondary | ICD-10-CM | POA: Diagnosis not present

## 2024-01-15 DIAGNOSIS — Z87891 Personal history of nicotine dependence: Secondary | ICD-10-CM | POA: Diagnosis not present

## 2024-01-15 DIAGNOSIS — E7800 Pure hypercholesterolemia, unspecified: Secondary | ICD-10-CM | POA: Diagnosis not present

## 2024-01-15 DIAGNOSIS — C61 Malignant neoplasm of prostate: Secondary | ICD-10-CM | POA: Diagnosis not present

## 2024-01-15 DIAGNOSIS — E1122 Type 2 diabetes mellitus with diabetic chronic kidney disease: Secondary | ICD-10-CM | POA: Diagnosis not present

## 2024-01-16 DIAGNOSIS — I428 Other cardiomyopathies: Secondary | ICD-10-CM | POA: Diagnosis not present

## 2024-01-16 DIAGNOSIS — I130 Hypertensive heart and chronic kidney disease with heart failure and stage 1 through stage 4 chronic kidney disease, or unspecified chronic kidney disease: Secondary | ICD-10-CM | POA: Diagnosis not present

## 2024-01-16 DIAGNOSIS — E1122 Type 2 diabetes mellitus with diabetic chronic kidney disease: Secondary | ICD-10-CM | POA: Diagnosis not present

## 2024-01-16 DIAGNOSIS — Z510 Encounter for antineoplastic radiation therapy: Secondary | ICD-10-CM | POA: Diagnosis not present

## 2024-01-16 DIAGNOSIS — C61 Malignant neoplasm of prostate: Secondary | ICD-10-CM | POA: Diagnosis not present

## 2024-01-16 DIAGNOSIS — N183 Chronic kidney disease, stage 3 unspecified: Secondary | ICD-10-CM | POA: Diagnosis not present

## 2024-01-16 DIAGNOSIS — Z87891 Personal history of nicotine dependence: Secondary | ICD-10-CM | POA: Diagnosis not present

## 2024-01-16 DIAGNOSIS — E7800 Pure hypercholesterolemia, unspecified: Secondary | ICD-10-CM | POA: Diagnosis not present

## 2024-01-16 DIAGNOSIS — I509 Heart failure, unspecified: Secondary | ICD-10-CM | POA: Diagnosis not present

## 2024-01-16 DIAGNOSIS — J449 Chronic obstructive pulmonary disease, unspecified: Secondary | ICD-10-CM | POA: Diagnosis not present

## 2024-01-16 DIAGNOSIS — I480 Paroxysmal atrial fibrillation: Secondary | ICD-10-CM | POA: Diagnosis not present

## 2024-01-17 DIAGNOSIS — I428 Other cardiomyopathies: Secondary | ICD-10-CM | POA: Diagnosis not present

## 2024-01-17 DIAGNOSIS — N183 Chronic kidney disease, stage 3 unspecified: Secondary | ICD-10-CM | POA: Diagnosis not present

## 2024-01-17 DIAGNOSIS — Z510 Encounter for antineoplastic radiation therapy: Secondary | ICD-10-CM | POA: Diagnosis not present

## 2024-01-17 DIAGNOSIS — E7800 Pure hypercholesterolemia, unspecified: Secondary | ICD-10-CM | POA: Diagnosis not present

## 2024-01-17 DIAGNOSIS — I509 Heart failure, unspecified: Secondary | ICD-10-CM | POA: Diagnosis not present

## 2024-01-17 DIAGNOSIS — Z87891 Personal history of nicotine dependence: Secondary | ICD-10-CM | POA: Diagnosis not present

## 2024-01-17 DIAGNOSIS — I480 Paroxysmal atrial fibrillation: Secondary | ICD-10-CM | POA: Diagnosis not present

## 2024-01-17 DIAGNOSIS — C61 Malignant neoplasm of prostate: Secondary | ICD-10-CM | POA: Diagnosis not present

## 2024-01-17 DIAGNOSIS — J449 Chronic obstructive pulmonary disease, unspecified: Secondary | ICD-10-CM | POA: Diagnosis not present

## 2024-01-17 DIAGNOSIS — I130 Hypertensive heart and chronic kidney disease with heart failure and stage 1 through stage 4 chronic kidney disease, or unspecified chronic kidney disease: Secondary | ICD-10-CM | POA: Diagnosis not present

## 2024-01-17 DIAGNOSIS — E1122 Type 2 diabetes mellitus with diabetic chronic kidney disease: Secondary | ICD-10-CM | POA: Diagnosis not present

## 2024-01-20 DIAGNOSIS — I509 Heart failure, unspecified: Secondary | ICD-10-CM | POA: Diagnosis not present

## 2024-01-20 DIAGNOSIS — E7800 Pure hypercholesterolemia, unspecified: Secondary | ICD-10-CM | POA: Diagnosis not present

## 2024-01-20 DIAGNOSIS — N183 Chronic kidney disease, stage 3 unspecified: Secondary | ICD-10-CM | POA: Diagnosis not present

## 2024-01-20 DIAGNOSIS — I130 Hypertensive heart and chronic kidney disease with heart failure and stage 1 through stage 4 chronic kidney disease, or unspecified chronic kidney disease: Secondary | ICD-10-CM | POA: Diagnosis not present

## 2024-01-20 DIAGNOSIS — I480 Paroxysmal atrial fibrillation: Secondary | ICD-10-CM | POA: Diagnosis not present

## 2024-01-20 DIAGNOSIS — I428 Other cardiomyopathies: Secondary | ICD-10-CM | POA: Diagnosis not present

## 2024-01-20 DIAGNOSIS — C61 Malignant neoplasm of prostate: Secondary | ICD-10-CM | POA: Diagnosis not present

## 2024-01-20 DIAGNOSIS — J449 Chronic obstructive pulmonary disease, unspecified: Secondary | ICD-10-CM | POA: Diagnosis not present

## 2024-01-20 DIAGNOSIS — E1122 Type 2 diabetes mellitus with diabetic chronic kidney disease: Secondary | ICD-10-CM | POA: Diagnosis not present

## 2024-01-20 DIAGNOSIS — Z510 Encounter for antineoplastic radiation therapy: Secondary | ICD-10-CM | POA: Diagnosis not present

## 2024-01-20 DIAGNOSIS — Z87891 Personal history of nicotine dependence: Secondary | ICD-10-CM | POA: Diagnosis not present

## 2024-01-20 NOTE — Progress Notes (Signed)
 History of Present Illness: James [name] is here for f/u of ePCa.  8.27.2024: TRUS/Bx. PSA 7.5, prostate volume 51 mL, PSAD 0.15. 11/12 cores revealed adenocarcinoma.  9 cores--GG2 w/ PNI seen 2 cores--GGI pattern  He has started ADT (Orgovyx) as well as EBRT with Dr Langston Masker.  Patient states that he has a couple of weeks of treatments left.  Altogether he has been doing well.  No hot flashes.  He has no dysuria or loose stools currently.    Past Medical History:  Diagnosis Date   CKD (chronic kidney disease) stage 2, GFR 60-89 ml/min    COPD (chronic obstructive pulmonary disease) (HCC)    Diabetes mellitus type II, non insulin dependent (HCC)    Dilated aortic root (HCC)    Elevated PSA    Hypercholesteremia    Hypertension    NICM (nonischemic cardiomyopathy) (HCC)    NSVT (nonsustained ventricular tachycardia) (HCC)    Obstructive sleep apnea    Paroxysmal atrial fibrillation (HCC)    Pulmonary hypertension (HCC)    PVC's (premature ventricular contractions)    Tobacco abuse     Past Surgical History:  Procedure Laterality Date   CARDIAC CATHETERIZATION  10/15/2000   COLONOSCOPY N/A 03/22/2014   Procedure: COLONOSCOPY;  Surgeon: West Bali, MD;  Location: [facility] ENDO SUITE;  Service: Endoscopy;  Laterality: N/A;  11:15 AM   ICD IMPLANT N/A 08/21/2021   Procedure: ICD IMPLANT;  Surgeon: Marinus Maw, MD;  Location: [facility] INVASIVE CV LAB;  Service: Cardiovascular;  Laterality: N/A;   INGUINAL HERNIA REPAIR Right 06/24/2017   Procedure: HERNIA REPAIR INGUINAL ADULT WITH MESH;  Surgeon: Franky Macho, MD;  Location: [facility] ORS;  Service: General;  Laterality: Right;   PROSTATE BIOPSY     RIGHT HEART CATH N/A 11/13/2022   Procedure: RIGHT HEART CATH;  Surgeon: Dorthula Nettles, DO;  Location: [facility] INVASIVE CV LAB;  Service: Cardiovascular;  Laterality: N/A;   RIGHT HEART CATH N/A 12/21/2022   Procedure: RIGHT HEART CATH;  Surgeon: Dorthula Nettles, DO;  Location: [facility] INVASIVE CV LAB;   Service: Cardiovascular;  Laterality: N/A;   RIGHT HEART CATH N/A 11/25/2023   Procedure: RIGHT HEART CATH;  Surgeon: Dorthula Nettles, DO;  Location: [facility] INVASIVE CV LAB;  Service: Cardiovascular;  Laterality: N/A;   RIGHT/LEFT HEART CATH AND CORONARY ANGIOGRAPHY N/A 07/19/2021   Procedure: RIGHT/LEFT HEART CATH AND CORONARY ANGIOGRAPHY;  Surgeon: Kathleene Hazel, MD;  Location: [facility] INVASIVE CV LAB;  Service: Cardiovascular;  Laterality: N/A;   TEE WITHOUT CARDIOVERSION N/A 12/21/2022   Procedure: TRANSESOPHAGEAL ECHOCARDIOGRAM (TEE);  Surgeon: Dorthula Nettles, DO;  Location: [facility] ENDOSCOPY;  Service: Cardiovascular;  Laterality: N/A;    Home Medications:  Allergies as of 01/21/2024   No Known Allergies      Medication List        Accurate as of January 20, 2024  4:04 PM. If you have any questions, ask your nurse or doctor.          acetaminophen 500 MG tablet Commonly known as: TYLENOL Take 1,000 mg by mouth every 6 (six) hours as needed for moderate pain.   albuterol 108 (90 Base) MCG/ACT inhaler Commonly known as: VENTOLIN HFA INHALE 2 PUFFS BY MOUTH FOUR TIMES DAILY AS NEEDED   apixaban 5 MG Tabs tablet Commonly known as: Eliquis Take 1 tablet (5 mg total) by mouth 2 (two) times daily.   aspirin EC 81 MG tablet Take 81 mg by mouth daily. Swallow whole.   atorvastatin 40 MG  tablet Commonly known as: LIPITOR TAKE 1 TABLET(40 MG) BY MOUTH DAILY   carvedilol 3.125 MG tablet Commonly known as: COREG Take 1 tablet (3.125 mg total) by mouth 2 (two) times daily with a meal.   dapagliflozin propanediol 10 MG Tabs tablet Commonly known as: Farxiga Take 1 tablet (10 mg total) by mouth daily before breakfast.   Entresto 97-103 MG Generic drug: sacubitril-valsartan TAKE 1 TABLET BY MOUTH TWICE DAILY   meclizine 25 MG tablet Commonly known as: ANTIVERT Take 1 tablet (25 mg total) by mouth 3 (three) times daily as needed for dizziness.   metFORMIN 500 MG  tablet Commonly known as: GLUCOPHAGE TAKE 1 TABLET BY MOUTH TWICE DAILY   spironolactone 25 MG tablet Commonly known as: ALDACTONE Take 1 tablet (25 mg total) by mouth at bedtime.   torsemide 20 MG tablet Commonly known as: DEMADEX TAKE 2 TABLETS(40 MG) BY MOUTH DAILY        Allergies: No Known Allergies  Family History  Problem Relation Age of Onset   Coronary artery disease Mother    Diabetes Father    Heart attack Father    Hypertension Father    Iron deficiency Father    Thyroid disease Sister    Cardiomyopathy Brother        No significant coronary disease by coronary angiography   Heart disease Maternal Grandfather        Also paternal grandfather   Sudden death Other    Colon cancer Neg Hx     Social History:  reports that he quit smoking about 2 years ago. His smoking use included cigarettes. He started smoking about 41 years ago. He has a 19.4 pack-year smoking history. He has never used smokeless tobacco. He reports that he does not drink alcohol and does not use drugs.  ROS: A complete review of systems was performed.  All systems are negative except for pertinent findings as noted.  Physical Exam:  Vital signs in last 24 hours: There were no vitals taken for this visit. Constitutional:  Alert and oriented, No acute distress Cardiovascular: Regular rate  Respiratory: Normal respiratory effort GI: Abdomen is soft, nontender, nondistended, no abdominal masses. No CVAT.  Genitourinary: Normal male phallus, testes are descended bilaterally and non-tender and without masses, scrotum is normal in appearance without lesions or masses, perineum is normal on inspection. Lymphatic: No lymphadenopathy Neurologic: Grossly intact, no focal deficits Psychiatric: Normal mood and affect  I have reviewed prior pt notes   I have reviewed prior PSA results     Impression/Assessment:  Grade group 2 prostate cancer, high-volume, in the midst of EBRT and short-term  ADT  Plan:  I will check his testosterone level today  I will continue him on Orgovyx for 3 more months

## 2024-01-21 ENCOUNTER — Ambulatory Visit (INDEPENDENT_AMBULATORY_CARE_PROVIDER_SITE_OTHER): Admitting: Urology

## 2024-01-21 ENCOUNTER — Ambulatory Visit (HOSPITAL_COMMUNITY)
Admission: RE | Admit: 2024-01-21 | Discharge: 2024-01-21 | Disposition: A | Discharge status: Home / Self Care | Source: Ambulatory Visit | Attending: Cardiology | Admitting: Cardiology

## 2024-01-21 ENCOUNTER — Ambulatory Visit: Admitting: Urology

## 2024-01-21 VITALS — BP 110/70 | HR 60 | Wt 256.0 lb

## 2024-01-21 VITALS — BP 84/48 | HR 78

## 2024-01-21 DIAGNOSIS — N189 Chronic kidney disease, unspecified: Secondary | ICD-10-CM | POA: Diagnosis not present

## 2024-01-21 DIAGNOSIS — N183 Chronic kidney disease, stage 3 unspecified: Secondary | ICD-10-CM | POA: Diagnosis not present

## 2024-01-21 DIAGNOSIS — I5022 Chronic systolic (congestive) heart failure: Secondary | ICD-10-CM

## 2024-01-21 DIAGNOSIS — I130 Hypertensive heart and chronic kidney disease with heart failure and stage 1 through stage 4 chronic kidney disease, or unspecified chronic kidney disease: Secondary | ICD-10-CM | POA: Insufficient documentation

## 2024-01-21 DIAGNOSIS — I509 Heart failure, unspecified: Secondary | ICD-10-CM | POA: Diagnosis not present

## 2024-01-21 DIAGNOSIS — I110 Hypertensive heart disease with heart failure: Secondary | ICD-10-CM | POA: Diagnosis not present

## 2024-01-21 DIAGNOSIS — Z95810 Presence of automatic (implantable) cardiac defibrillator: Secondary | ICD-10-CM | POA: Insufficient documentation

## 2024-01-21 DIAGNOSIS — C61 Malignant neoplasm of prostate: Secondary | ICD-10-CM | POA: Diagnosis not present

## 2024-01-21 DIAGNOSIS — Q2543 Congenital aneurysm of aorta: Secondary | ICD-10-CM | POA: Diagnosis not present

## 2024-01-21 DIAGNOSIS — Z87891 Personal history of nicotine dependence: Secondary | ICD-10-CM | POA: Diagnosis not present

## 2024-01-21 DIAGNOSIS — E1122 Type 2 diabetes mellitus with diabetic chronic kidney disease: Secondary | ICD-10-CM | POA: Insufficient documentation

## 2024-01-21 DIAGNOSIS — J449 Chronic obstructive pulmonary disease, unspecified: Secondary | ICD-10-CM | POA: Diagnosis not present

## 2024-01-21 DIAGNOSIS — I4892 Unspecified atrial flutter: Secondary | ICD-10-CM | POA: Insufficient documentation

## 2024-01-21 DIAGNOSIS — Z7901 Long term (current) use of anticoagulants: Secondary | ICD-10-CM | POA: Diagnosis not present

## 2024-01-21 DIAGNOSIS — I428 Other cardiomyopathies: Secondary | ICD-10-CM | POA: Insufficient documentation

## 2024-01-21 DIAGNOSIS — E7800 Pure hypercholesterolemia, unspecified: Secondary | ICD-10-CM | POA: Diagnosis not present

## 2024-01-21 DIAGNOSIS — I5020 Unspecified systolic (congestive) heart failure: Secondary | ICD-10-CM | POA: Insufficient documentation

## 2024-01-21 DIAGNOSIS — I480 Paroxysmal atrial fibrillation: Secondary | ICD-10-CM | POA: Insufficient documentation

## 2024-01-21 DIAGNOSIS — E119 Type 2 diabetes mellitus without complications: Secondary | ICD-10-CM | POA: Diagnosis not present

## 2024-01-21 DIAGNOSIS — Z510 Encounter for antineoplastic radiation therapy: Secondary | ICD-10-CM | POA: Diagnosis not present

## 2024-01-21 LAB — URINALYSIS, ROUTINE W REFLEX MICROSCOPIC
Bilirubin, UA: NEGATIVE
Ketones, UA: NEGATIVE
Leukocytes,UA: NEGATIVE
Nitrite, UA: NEGATIVE
Protein,UA: NEGATIVE
Specific Gravity, UA: <1.005 — ABNORMAL LOW (ref 1.005–1.030)
Urobilinogen, Ur: 1 mg/dL (ref 0.2–1.0)
pH, UA: 6 (ref 5.0–7.5)

## 2024-01-21 LAB — MICROSCOPIC EXAMINATION
Bacteria, UA: NONE SEEN
Epithelial Cells (non renal): NONE SEEN /HPF (ref 0–10)
WBC, UA: NONE SEEN /HPF (ref 0–5)

## 2024-01-21 NOTE — Patient Instructions (Signed)
 It was a pleasure seeing you today!  MEDICATIONS: -No medication changes today -Call if you have questions about your medications.   NEXT APPOINTMENT: Return to clinic in 1 month with Dr. Gasper Lloyd.  In general, to take care of your heart failure: -Limit your fluid intake to 2 Liters (half-gallon) per day.   -Limit your salt intake to ideally 2-3 grams (2000-3000 mg) per day. -Weigh yourself daily and record, and bring that "weight diary" to your next appointment.  (Weight gain of 2-3 pounds in 1 day typically means fluid weight.) -The medications for your heart are to help your heart and help you live longer.   -Please contact us before stopping any of your heart medications.  Call the clinic at [phone] with questions or to reschedule future appointments.

## 2024-01-22 ENCOUNTER — Telehealth: Payer: Self-pay

## 2024-01-22 DIAGNOSIS — E7800 Pure hypercholesterolemia, unspecified: Secondary | ICD-10-CM | POA: Diagnosis not present

## 2024-01-22 DIAGNOSIS — C61 Malignant neoplasm of prostate: Secondary | ICD-10-CM | POA: Diagnosis not present

## 2024-01-22 DIAGNOSIS — I428 Other cardiomyopathies: Secondary | ICD-10-CM | POA: Diagnosis not present

## 2024-01-22 DIAGNOSIS — Z510 Encounter for antineoplastic radiation therapy: Secondary | ICD-10-CM | POA: Diagnosis not present

## 2024-01-22 DIAGNOSIS — J449 Chronic obstructive pulmonary disease, unspecified: Secondary | ICD-10-CM | POA: Diagnosis not present

## 2024-01-22 DIAGNOSIS — Z87891 Personal history of nicotine dependence: Secondary | ICD-10-CM | POA: Diagnosis not present

## 2024-01-22 DIAGNOSIS — I130 Hypertensive heart and chronic kidney disease with heart failure and stage 1 through stage 4 chronic kidney disease, or unspecified chronic kidney disease: Secondary | ICD-10-CM | POA: Diagnosis not present

## 2024-01-22 DIAGNOSIS — N183 Chronic kidney disease, stage 3 unspecified: Secondary | ICD-10-CM | POA: Diagnosis not present

## 2024-01-22 DIAGNOSIS — I480 Paroxysmal atrial fibrillation: Secondary | ICD-10-CM | POA: Diagnosis not present

## 2024-01-22 DIAGNOSIS — E1122 Type 2 diabetes mellitus with diabetic chronic kidney disease: Secondary | ICD-10-CM | POA: Diagnosis not present

## 2024-01-22 DIAGNOSIS — I509 Heart failure, unspecified: Secondary | ICD-10-CM | POA: Diagnosis not present

## 2024-01-22 LAB — TESTOSTERONE: Testosterone: 13 ng/dL — ABNORMAL LOW (ref 264–916)

## 2024-01-22 NOTE — Telephone Encounter (Signed)
 Orgovyx forms, patient insurance, and office note faxed to [email or web address] and [employer] with confirmation

## 2024-01-23 DIAGNOSIS — Z510 Encounter for antineoplastic radiation therapy: Secondary | ICD-10-CM | POA: Diagnosis not present

## 2024-01-23 DIAGNOSIS — E7800 Pure hypercholesterolemia, unspecified: Secondary | ICD-10-CM | POA: Diagnosis not present

## 2024-01-23 DIAGNOSIS — N183 Chronic kidney disease, stage 3 unspecified: Secondary | ICD-10-CM | POA: Diagnosis not present

## 2024-01-23 DIAGNOSIS — I480 Paroxysmal atrial fibrillation: Secondary | ICD-10-CM | POA: Diagnosis not present

## 2024-01-23 DIAGNOSIS — I130 Hypertensive heart and chronic kidney disease with heart failure and stage 1 through stage 4 chronic kidney disease, or unspecified chronic kidney disease: Secondary | ICD-10-CM | POA: Diagnosis not present

## 2024-01-23 DIAGNOSIS — I428 Other cardiomyopathies: Secondary | ICD-10-CM | POA: Diagnosis not present

## 2024-01-23 DIAGNOSIS — E1122 Type 2 diabetes mellitus with diabetic chronic kidney disease: Secondary | ICD-10-CM | POA: Diagnosis not present

## 2024-01-23 DIAGNOSIS — I509 Heart failure, unspecified: Secondary | ICD-10-CM | POA: Diagnosis not present

## 2024-01-23 DIAGNOSIS — Z87891 Personal history of nicotine dependence: Secondary | ICD-10-CM | POA: Diagnosis not present

## 2024-01-23 DIAGNOSIS — J449 Chronic obstructive pulmonary disease, unspecified: Secondary | ICD-10-CM | POA: Diagnosis not present

## 2024-01-23 DIAGNOSIS — C61 Malignant neoplasm of prostate: Secondary | ICD-10-CM | POA: Diagnosis not present

## 2024-01-23 NOTE — Telephone Encounter (Signed)
 Medication prior authorization request received.  Completed PA request through cover my meds for drug Orgovyx. KEY: [id number]  Approved: Pending

## 2024-01-24 DIAGNOSIS — E7800 Pure hypercholesterolemia, unspecified: Secondary | ICD-10-CM | POA: Diagnosis not present

## 2024-01-24 DIAGNOSIS — I428 Other cardiomyopathies: Secondary | ICD-10-CM | POA: Diagnosis not present

## 2024-01-24 DIAGNOSIS — Z87891 Personal history of nicotine dependence: Secondary | ICD-10-CM | POA: Diagnosis not present

## 2024-01-24 DIAGNOSIS — C61 Malignant neoplasm of prostate: Secondary | ICD-10-CM | POA: Diagnosis not present

## 2024-01-24 DIAGNOSIS — I130 Hypertensive heart and chronic kidney disease with heart failure and stage 1 through stage 4 chronic kidney disease, or unspecified chronic kidney disease: Secondary | ICD-10-CM | POA: Diagnosis not present

## 2024-01-24 DIAGNOSIS — N183 Chronic kidney disease, stage 3 unspecified: Secondary | ICD-10-CM | POA: Diagnosis not present

## 2024-01-24 DIAGNOSIS — E1122 Type 2 diabetes mellitus with diabetic chronic kidney disease: Secondary | ICD-10-CM | POA: Diagnosis not present

## 2024-01-24 DIAGNOSIS — Z510 Encounter for antineoplastic radiation therapy: Secondary | ICD-10-CM | POA: Diagnosis not present

## 2024-01-24 DIAGNOSIS — I509 Heart failure, unspecified: Secondary | ICD-10-CM | POA: Diagnosis not present

## 2024-01-24 DIAGNOSIS — J449 Chronic obstructive pulmonary disease, unspecified: Secondary | ICD-10-CM | POA: Diagnosis not present

## 2024-01-24 DIAGNOSIS — I480 Paroxysmal atrial fibrillation: Secondary | ICD-10-CM | POA: Diagnosis not present

## 2024-01-27 DIAGNOSIS — I130 Hypertensive heart and chronic kidney disease with heart failure and stage 1 through stage 4 chronic kidney disease, or unspecified chronic kidney disease: Secondary | ICD-10-CM | POA: Diagnosis not present

## 2024-01-27 DIAGNOSIS — J449 Chronic obstructive pulmonary disease, unspecified: Secondary | ICD-10-CM | POA: Diagnosis not present

## 2024-01-27 DIAGNOSIS — C61 Malignant neoplasm of prostate: Secondary | ICD-10-CM | POA: Diagnosis not present

## 2024-01-27 DIAGNOSIS — E7800 Pure hypercholesterolemia, unspecified: Secondary | ICD-10-CM | POA: Diagnosis not present

## 2024-01-27 DIAGNOSIS — I428 Other cardiomyopathies: Secondary | ICD-10-CM | POA: Diagnosis not present

## 2024-01-27 DIAGNOSIS — E1122 Type 2 diabetes mellitus with diabetic chronic kidney disease: Secondary | ICD-10-CM | POA: Diagnosis not present

## 2024-01-27 DIAGNOSIS — Z510 Encounter for antineoplastic radiation therapy: Secondary | ICD-10-CM | POA: Diagnosis not present

## 2024-01-27 DIAGNOSIS — N183 Chronic kidney disease, stage 3 unspecified: Secondary | ICD-10-CM | POA: Diagnosis not present

## 2024-01-27 DIAGNOSIS — Z87891 Personal history of nicotine dependence: Secondary | ICD-10-CM | POA: Diagnosis not present

## 2024-01-27 DIAGNOSIS — I480 Paroxysmal atrial fibrillation: Secondary | ICD-10-CM | POA: Diagnosis not present

## 2024-01-27 DIAGNOSIS — I509 Heart failure, unspecified: Secondary | ICD-10-CM | POA: Diagnosis not present

## 2024-01-28 ENCOUNTER — Telehealth: Payer: Self-pay | Admitting: Internal Medicine

## 2024-01-28 ENCOUNTER — Telehealth: Payer: Self-pay | Admitting: Cardiovascular Disease

## 2024-01-28 DIAGNOSIS — I509 Heart failure, unspecified: Secondary | ICD-10-CM | POA: Diagnosis not present

## 2024-01-28 DIAGNOSIS — I130 Hypertensive heart and chronic kidney disease with heart failure and stage 1 through stage 4 chronic kidney disease, or unspecified chronic kidney disease: Secondary | ICD-10-CM | POA: Diagnosis not present

## 2024-01-28 DIAGNOSIS — J449 Chronic obstructive pulmonary disease, unspecified: Secondary | ICD-10-CM | POA: Diagnosis not present

## 2024-01-28 DIAGNOSIS — Z87891 Personal history of nicotine dependence: Secondary | ICD-10-CM | POA: Diagnosis not present

## 2024-01-28 DIAGNOSIS — C61 Malignant neoplasm of prostate: Secondary | ICD-10-CM | POA: Diagnosis not present

## 2024-01-28 DIAGNOSIS — E1122 Type 2 diabetes mellitus with diabetic chronic kidney disease: Secondary | ICD-10-CM | POA: Diagnosis not present

## 2024-01-28 DIAGNOSIS — N183 Chronic kidney disease, stage 3 unspecified: Secondary | ICD-10-CM | POA: Diagnosis not present

## 2024-01-28 DIAGNOSIS — I480 Paroxysmal atrial fibrillation: Secondary | ICD-10-CM | POA: Diagnosis not present

## 2024-01-28 DIAGNOSIS — E7800 Pure hypercholesterolemia, unspecified: Secondary | ICD-10-CM | POA: Diagnosis not present

## 2024-01-28 DIAGNOSIS — Z510 Encounter for antineoplastic radiation therapy: Secondary | ICD-10-CM | POA: Diagnosis not present

## 2024-01-28 DIAGNOSIS — I428 Other cardiomyopathies: Secondary | ICD-10-CM | POA: Diagnosis not present

## 2024-01-28 DIAGNOSIS — Z0279 Encounter for issue of other medical certificate: Secondary | ICD-10-CM

## 2024-01-28 NOTE — Telephone Encounter (Signed)
 Patient will pick up forms and take to his cardiologist provider.

## 2024-01-28 NOTE — Telephone Encounter (Signed)
 FMLA forms received from pt on 01/28/2024. Completed Patient authorization and billing form. Both including blank FMLA forms uploaded into pts chart. $29.00 cash received. Faxing paperwork to Dr. Stann Earnest at [facility].

## 2024-01-28 NOTE — Telephone Encounter (Signed)
 FMLA forms  Copied Noted Sleeved put in provider box   Fax to [phone] and call patient to pick up the original copy

## 2024-01-29 DIAGNOSIS — J449 Chronic obstructive pulmonary disease, unspecified: Secondary | ICD-10-CM | POA: Diagnosis not present

## 2024-01-29 DIAGNOSIS — Z87891 Personal history of nicotine dependence: Secondary | ICD-10-CM | POA: Diagnosis not present

## 2024-01-29 DIAGNOSIS — C61 Malignant neoplasm of prostate: Secondary | ICD-10-CM | POA: Diagnosis not present

## 2024-01-29 DIAGNOSIS — E1122 Type 2 diabetes mellitus with diabetic chronic kidney disease: Secondary | ICD-10-CM | POA: Diagnosis not present

## 2024-01-29 DIAGNOSIS — N183 Chronic kidney disease, stage 3 unspecified: Secondary | ICD-10-CM | POA: Diagnosis not present

## 2024-01-29 DIAGNOSIS — Z510 Encounter for antineoplastic radiation therapy: Secondary | ICD-10-CM | POA: Diagnosis not present

## 2024-01-29 DIAGNOSIS — I509 Heart failure, unspecified: Secondary | ICD-10-CM | POA: Diagnosis not present

## 2024-01-29 DIAGNOSIS — I480 Paroxysmal atrial fibrillation: Secondary | ICD-10-CM | POA: Diagnosis not present

## 2024-01-29 DIAGNOSIS — E7800 Pure hypercholesterolemia, unspecified: Secondary | ICD-10-CM | POA: Diagnosis not present

## 2024-01-29 DIAGNOSIS — I428 Other cardiomyopathies: Secondary | ICD-10-CM | POA: Diagnosis not present

## 2024-01-29 DIAGNOSIS — I130 Hypertensive heart and chronic kidney disease with heart failure and stage 1 through stage 4 chronic kidney disease, or unspecified chronic kidney disease: Secondary | ICD-10-CM | POA: Diagnosis not present

## 2024-02-01 DIAGNOSIS — G4733 Obstructive sleep apnea (adult) (pediatric): Secondary | ICD-10-CM | POA: Diagnosis not present

## 2024-02-03 ENCOUNTER — Telehealth: Payer: Self-pay

## 2024-02-03 NOTE — Telephone Encounter (Signed)
 Patient was made aware and voiced understanding "Let pt know T level is low--med doing its job "

## 2024-02-03 NOTE — Telephone Encounter (Signed)
 Records efaxed to [employer] support program- Release: [id number]

## 2024-02-12 ENCOUNTER — Ambulatory Visit (INDEPENDENT_AMBULATORY_CARE_PROVIDER_SITE_OTHER)

## 2024-02-12 DIAGNOSIS — E119 Type 2 diabetes mellitus without complications: Secondary | ICD-10-CM | POA: Diagnosis not present

## 2024-02-12 LAB — HM DIABETES EYE EXAM

## 2024-02-12 NOTE — Progress Notes (Signed)
 James [name] arrived 02/12/2024 and has given verbal consent to obtain images and complete their overdue diabetic retinal screening.  The images have been sent to an ophthalmologist or optometrist for review and interpretation.  Results will be sent back to [name], MD for review.  Patient has been informed they will be contacted when we receive the results via telephone or MyChart

## 2024-02-13 NOTE — Telephone Encounter (Signed)
 [employer] FMLA form placed in Dr. Francie Irani box.  Patient  paid $29 form fee at our [place] office.

## 2024-02-13 NOTE — Telephone Encounter (Signed)
 Patient has follow-up with HF on 5/16. Will send message to [place] to make sure our office gets paperwork.

## 2024-02-17 ENCOUNTER — Ambulatory Visit: Payer: BC Managed Care – PPO

## 2024-02-18 NOTE — Telephone Encounter (Signed)
 Completed FMLA form scanned to chart and faxed to [facility].  Billing notified.

## 2024-02-20 ENCOUNTER — Telehealth: Payer: Self-pay | Admitting: *Deleted

## 2024-02-20 NOTE — Telephone Encounter (Signed)
 Received alert from [employer] that patient missed remote transmission on 02/17/24. Called patient to inquire Patient stated his device was plugged in and showing a "full battery" This RN requested quick check on [employer] website Delay in transmission request [employer] says last update was 02/19/24 Will recheck [employer] tomorrow morning for new transmission and call patient if none

## 2024-02-21 ENCOUNTER — Encounter: Payer: Self-pay | Admitting: *Deleted

## 2024-02-21 NOTE — Telephone Encounter (Signed)
Follow up message sent to patient via My Chart.

## 2024-02-21 NOTE — Telephone Encounter (Signed)
 Transmission received in [employer].

## 2024-02-26 ENCOUNTER — Emergency Department (HOSPITAL_COMMUNITY)
Admission: EM | Admit: 2024-02-26 | Discharge: 2024-02-26 | Disposition: A | Discharge status: Home / Self Care | Attending: Emergency Medicine | Admitting: Emergency Medicine

## 2024-02-26 ENCOUNTER — Other Ambulatory Visit: Payer: Self-pay

## 2024-02-26 ENCOUNTER — Ambulatory Visit
Admission: EM | Admit: 2024-02-26 | Discharge: 2024-02-26 | Disposition: A | Discharge status: Home / Self Care | Attending: Nurse Practitioner | Admitting: Nurse Practitioner

## 2024-02-26 ENCOUNTER — Emergency Department (HOSPITAL_COMMUNITY)

## 2024-02-26 ENCOUNTER — Encounter (HOSPITAL_COMMUNITY): Payer: Self-pay

## 2024-02-26 ENCOUNTER — Encounter: Payer: Self-pay | Admitting: Emergency Medicine

## 2024-02-26 DIAGNOSIS — Z7982 Long term (current) use of aspirin: Secondary | ICD-10-CM | POA: Diagnosis not present

## 2024-02-26 DIAGNOSIS — Z7901 Long term (current) use of anticoagulants: Secondary | ICD-10-CM | POA: Diagnosis not present

## 2024-02-26 DIAGNOSIS — R519 Headache, unspecified: Secondary | ICD-10-CM | POA: Diagnosis not present

## 2024-02-26 DIAGNOSIS — I509 Heart failure, unspecified: Secondary | ICD-10-CM | POA: Diagnosis not present

## 2024-02-26 DIAGNOSIS — J0100 Acute maxillary sinusitis, unspecified: Secondary | ICD-10-CM

## 2024-02-26 DIAGNOSIS — D329 Benign neoplasm of meninges, unspecified: Secondary | ICD-10-CM | POA: Insufficient documentation

## 2024-02-26 DIAGNOSIS — G932 Benign intracranial hypertension: Secondary | ICD-10-CM | POA: Diagnosis not present

## 2024-02-26 LAB — CBC WITH DIFFERENTIAL/PLATELET
Abs Immature Granulocytes: 0.02 10*3/uL (ref 0.00–0.07)
Basophils Absolute: 0 10*3/uL (ref 0.0–0.1)
Basophils Relative: 0 %
Eosinophils Absolute: 0.1 10*3/uL (ref 0.0–0.5)
Eosinophils Relative: 2 %
HCT: 37.5 % — ABNORMAL LOW (ref 39.0–52.0)
Hemoglobin: 12.9 g/dL — ABNORMAL LOW (ref 13.0–17.0)
Immature Granulocytes: 0 %
Lymphocytes Relative: 11 %
Lymphs Abs: 0.8 10*3/uL (ref 0.7–4.0)
MCH: 33.4 pg (ref 26.0–34.0)
MCHC: 34.4 g/dL (ref 30.0–36.0)
MCV: 97.2 fL (ref 80.0–100.0)
Monocytes Absolute: 0.8 10*3/uL (ref 0.1–1.0)
Monocytes Relative: 10 %
Neutro Abs: 5.5 10*3/uL (ref 1.7–7.7)
Neutrophils Relative %: 77 %
Platelets: 178 10*3/uL (ref 150–400)
RBC: 3.86 MIL/uL — ABNORMAL LOW (ref 4.22–5.81)
RDW: 13.9 % (ref 11.5–15.5)
WBC: 7.2 10*3/uL (ref 4.0–10.5)
nRBC: 0 % (ref 0.0–0.2)

## 2024-02-26 LAB — POC COVID19/FLU A&B COMBO
Covid Antigen, POC: NEGATIVE
Influenza A Antigen, POC: NEGATIVE
Influenza B Antigen, POC: NEGATIVE

## 2024-02-26 LAB — I-STAT CHEM 8, ED
BUN: 30 mg/dL — ABNORMAL HIGH (ref 8–23)
Calcium, Ion: 1.09 mmol/L — ABNORMAL LOW (ref 1.15–1.40)
Chloride: 100 mmol/L (ref 98–111)
Creatinine, Ser: 1.5 mg/dL — ABNORMAL HIGH (ref 0.61–1.24)
Glucose, Bld: 98 mg/dL (ref 70–99)
HCT: 37 % — ABNORMAL LOW (ref 39.0–52.0)
Hemoglobin: 12.6 g/dL — ABNORMAL LOW (ref 13.0–17.0)
Potassium: 4.3 mmol/L (ref 3.5–5.1)
Sodium: 138 mmol/L (ref 135–145)
TCO2: 29 mmol/L (ref 22–32)

## 2024-02-26 MED ORDER — AMOXICILLIN-POT CLAVULANATE 875-125 MG PO TABS
1.0000 | ORAL_TABLET | Freq: Two times a day (BID) | ORAL | 0 refills | Status: DC
Start: 2024-02-26 — End: 2024-02-28

## 2024-02-26 MED ORDER — IOHEXOL 350 MG/ML SOLN
75.0000 mL | Freq: Once | INTRAVENOUS | Status: AC | PRN
Start: 2024-02-26 — End: 2024-02-26
  Administered 2024-02-26: 75 mL via INTRAVENOUS

## 2024-02-26 MED ORDER — DIPHENHYDRAMINE HCL 50 MG/ML IJ SOLN
25.0000 mg | Freq: Once | INTRAMUSCULAR | Status: AC
  Administered 2024-02-26: 25 mg via INTRAVENOUS
  Filled 2024-02-26: qty 1

## 2024-02-26 MED ORDER — PROCHLORPERAZINE EDISYLATE 10 MG/2ML IJ SOLN
10.0000 mg | Freq: Once | INTRAMUSCULAR | Status: AC
  Administered 2024-02-26: 10 mg via INTRAVENOUS
  Filled 2024-02-26: qty 2

## 2024-02-26 NOTE — ED Provider Notes (Addendum)
 [place] EMERGENCY DEPARTMENT AT [facility] Provider Note   CSN: [id number] Arrival date & time: 02/26/24  1633     History  Chief Complaint  Patient presents with   Headache    James [name] is a 63 y.o. male.  HPI    63 year old patient comes with a chief complaint of headache.  Patient has history of nonischemic cardiomyopathy with EF less than 20%, AICD placement, diabetes, aortic root aneurysm.  He states that he has been having headache for the last 5 days.  The headache is constant, located over his forehead and crown.  Pain is described as sharp, pressure-like pain that is constant, worse with light and when he lays back.  He denies any specific association with time of the day.  Patient denies any associated vision loss, double vision, slurred speech, one-sided weakness, numbness, slurred speech.   Home Medications Prior to Admission medications   Medication Sig Start Date End Date Taking? Authorizing Provider  acetaminophen  (TYLENOL ) 500 MG tablet Take 1,000 mg by mouth every 6 (six) hours as needed for moderate pain.    [provider]  albuterol  (VENTOLIN  HFA) 108 (90 Base) MCG/ACT inhaler INHALE 2 PUFFS BY MOUTH FOUR TIMES DAILY AS NEEDED 08/21/23   Dixon, Phillip E, MD  amoxicillin -clavulanate (AUGMENTIN ) 875-125 MG tablet Take 1 tablet by mouth every 12 (twelve) hours. 02/26/24   Leath-Warren, Belen Bowers, NP  apixaban  (ELIQUIS ) 5 MG TABS tablet Take 1 tablet (5 mg total) by mouth 2 (two) times daily. 12/13/23   Tammie Fall, MD  aspirin  EC 81 MG tablet Take 81 mg by mouth daily. Swallow whole.    [provider]  atorvastatin  (LIPITOR) 40 MG tablet TAKE 1 TABLET(40 MG) BY MOUTH DAILY 11/14/23   Sabharwal, Aditya, DO  carvedilol  (COREG ) 3.125 MG tablet Take 1 tablet (3.125 mg total) by mouth 2 (two) times daily with a meal. 01/07/24   Milford, Arlice Bene, FNP  dapagliflozin  propanediol (FARXIGA ) 10 MG TABS tablet Take 1 tablet (10 mg  total) by mouth daily before breakfast. 03/22/23   Sabharwal, Aditya, DO  ENTRESTO  97-103 MG TAKE 1 TABLET BY MOUTH TWICE DAILY 11/04/23   Sabharwal, Aditya, DO  meclizine  (ANTIVERT ) 25 MG tablet Take 1 tablet (25 mg total) by mouth 3 (three) times daily as needed for dizziness. 12/02/23   Dixon, Phillip E, MD  metFORMIN (GLUCOPHAGE) 500 MG tablet TAKE 1 TABLET BY MOUTH TWICE DAILY 08/21/23   [name], MD  spironolactone  (ALDACTONE ) 25 MG tablet Take 1 tablet (25 mg total) by mouth at bedtime. 01/07/24   Elmarie Hacking, FNP  torsemide  (DEMADEX ) 20 MG tablet TAKE 2 TABLETS(40 MG) BY MOUTH DAILY 01/13/24   [name], MD      Allergies    Patient has no known allergies.    Review of Systems   Review of Systems  All other systems reviewed and are negative.   Physical Exam Updated Vital Signs BP 128/85 (BP Location: Right Arm)   Pulse 69   Temp 98.1 F (36.7 C) (Oral)   Resp 15   Ht 6\' 6"  (1.981 m)   Wt 115.2 kg   SpO2 100%   BMI 29.35 kg/m  Physical Exam Vitals and nursing note reviewed.  Constitutional:      Appearance: He is well-developed.  HENT:     Head: Atraumatic.  Eyes:     General: No visual field deficit.    Extraocular Movements: Extraocular movements intact.  Pupils: Pupils are equal, round, and reactive to light.  Cardiovascular:     Rate and Rhythm: Normal rate.  Pulmonary:     Effort: Pulmonary effort is normal.  Musculoskeletal:     Cervical back: Neck supple.  Skin:    General: Skin is warm.  Neurological:     Mental Status: He is alert and oriented to person, place, and time.     GCS: GCS eye subscore is 4. GCS verbal subscore is 5. GCS motor subscore is 6.     Cranial Nerves: No cranial nerve deficit, dysarthria or facial asymmetry.     ED Results / Procedures / Treatments   Labs (all labs ordered are listed, but only abnormal results are displayed) Labs Reviewed  CBC WITH DIFFERENTIAL/PLATELET - Abnormal; Notable for the  following components:      Result Value   RBC 3.86 (*)    Hemoglobin 12.9 (*)    HCT 37.5 (*)    All other components within normal limits  I-STAT CHEM 8, ED - Abnormal; Notable for the following components:   BUN 30 (*)    Creatinine, Ser 1.50 (*)    Calcium , Ion 1.09 (*)    Hemoglobin 12.6 (*)    HCT 37.0 (*)    All other components within normal limits    EKG None  Radiology CT VENOGRAM HEAD Result Date: 02/26/2024 CLINICAL DATA:  Headache, intracranial hypertension features. Facial pressure, bilateral ear pressure, nasal congestion, and headache. EXAM: CT VENOGRAM HEAD TECHNIQUE: Venographic phase images of the brain were obtained following the administration of intravenous contrast. Multiplanar reformats and maximum intensity projections were generated. RADIATION DOSE REDUCTION: This exam was performed according to the departmental dose-optimization program which includes automated exposure control, adjustment of the mA and/or kV according to patient size and/or use of iterative reconstruction technique. CONTRAST:  75mL OMNIPAQUE  IOHEXOL  350 MG/ML SOLN COMPARISON:  MRI head 09/12/2005. FINDINGS: Brain: No acute intracranial hemorrhage. No CT evidence of acute infarct. No edema or midline shift. The basilar cisterns are patent. Ventricles: The ventricles are normal. Vascular: Atherosclerotic calcifications of the carotid siphons. No hyperdense vessel. Skull: No acute or aggressive finding. Orbits: Orbits are symmetric. Sinuses: Mucosal thickening in the frontal and ethmoid sinuses with near complete opacification of the bilateral anterior ethmoid air cells. Mucosal thickening in the partially visualized left maxillary sinus. Other: Mastoid air cells are clear. Venous sinuses: The superior sagittal sinus is patent. Internal cerebral veins, vein of Galen, and straight sinus are patent. Normal appearance of the confluence of the sinuses. The right transverse and sigmoid sinus are patent. Left  transverse and sigmoid sinus are patent. Focal arachnoid granulation along the distal left transverse sinus. No evidence of venous sinus thrombosis. No significant venous sinus stenosis appreciated. There is a 0.9 x 0.6 x 0.9 cm enhancing mass along the left aspect of the anterior falx suggestive of a meningioma. Mild associated mass effect on the adjacent left frontal parenchyma without evidence of edema. IMPRESSION: No CT evidence of acute intracranial abnormality. Patent dural venous sinuses. No evidence of venous sinus thrombosis or venous sinus stenosis. 0.9 cm enhancing mass along the left aspect of the anterior falx suggestive of meningioma. Mucosal thickening of the visualized paranasal sinuses as above. Electronically Signed   By: Denny Flack M.D.   On: 02/26/2024 20:07    Procedures Procedures    Medications Ordered in ED Medications  prochlorperazine (COMPAZINE) injection 10 mg (10 mg Intravenous Given 02/26/24 1758)  diphenhydrAMINE (BENADRYL) injection  25 mg (25 mg Intravenous Given 02/26/24 1758)  iohexol  (OMNIPAQUE ) 350 MG/ML injection 75 mL (75 mLs Intravenous Contrast Given 02/26/24 1832)    ED Course/ Medical Decision Making/ A&P Clinical Course as of 02/26/24 2153  Wed Feb 26, 2024  2054 Upon reassessment, patient reports that the headache has resolved. He continues to have no neurologic complains.Given the meningioma, will give neurosurgery follow up.   Strict return precautions discussed, pt will return to the ER if there is visual complains, seizures, altered mental status, loss of consciousness, dizziness, new focal weakness, or numbness.    [AN]  2153 Neurosurgery, Dr. Larrie Po thinks he still should see them for monitoring. Will provide f/u.  [AN]    Clinical Course User Index [AN] Deatra Face, MD                                 Medical Decision Making Amount and/or Complexity of Data Reviewed Labs: ordered. Radiology: ordered.  Risk Prescription drug  management.   63 year old male with history ofpulmonary hypertension, CHF with a EF less than 20%, aortic root dilation and diabetes comes in with chief complaint of headaches.  Patient has no history of headache.  The current headache has been constant for the last 5 days.  Differential diagnosis considered for this patient includes: Primary headaches - including migrainous headaches, cluster headaches, tension headaches. ICH Carotid dissection Cavernous sinus thrombosis Meningitis Encephalitis Sinusitis Tumor Vascular headaches AV malformation Brain aneurysm Muscular headaches  A/P: Pt comes in with cc of headaches.  Red flags include that the pain is constant, he is age over 63 and has no history of headaches.   Plan is to give him a headache cocktail, get a CT venogram. Patient has AICD in place that excludes him from getting MRI at [facility].   Final Clinical Impression(s) / ED Diagnoses Final diagnoses:  Meningioma (HCC)  Acute nonintractable headache, unspecified headache type    Rx / DC Orders ED Discharge Orders     None          Deatra Face, MD 02/26/24 2153    Deatra Face, MD 02/26/24 2153

## 2024-02-26 NOTE — ED Triage Notes (Signed)
 Pt reports headache since Saturday, saw UC earlier today and ws given medicine but it has not helped.

## 2024-02-26 NOTE — ED Notes (Signed)
 Pt back from CT. Stated, "I feel good."

## 2024-02-26 NOTE — Discharge Instructions (Addendum)
 You were seen in the emergency room for headaches. The CT scan in the emergency room does find evidence of small meningioma.  Read the instructions provided. There is no evidence of any significant swelling, which is reassuring, therefore we recommend you follow-up with neurosurgery for routine surveillance.  Please return to the ER if the headache gets severe and in not improving, you have associated new one sided numbness, tingling, weakness or confusion, seizures, poor balance or poor vision.

## 2024-02-26 NOTE — ED Provider Notes (Signed)
 [facility]    CSN: [id number] Arrival date & time: 02/26/24  1120      History   Chief Complaint Chief Complaint  Patient presents with   Headache    HPI James [name] is a 63 y.o. male.   The history is provided by the patient.   Patient presents with a 5-day history of headache, sinus pressure, ear pressure, nasal congestion, and a mild cough.  Patient denies fever, chills, ear drainage, wheezing, difficulty breathing, chest pain, abdominal pain, nausea, vomiting, diarrhea, or rash.  Patient reports he has been taking several over-the-counter cough and cold medications for symptoms with minimal relief.  Patient denies obvious sick contacts.  States he is currently undergoing radiation treatments for prostate cancer. Past Medical History:  Diagnosis Date   CKD (chronic kidney disease) stage 2, GFR 60-89 ml/min    COPD (chronic obstructive pulmonary disease) (HCC)    Diabetes mellitus type II, non insulin  dependent (HCC)    Dilated aortic root (HCC)    Elevated PSA    Hypercholesteremia    Hypertension    NICM (nonischemic cardiomyopathy) (HCC)    NSVT (nonsustained ventricular tachycardia) (HCC)    Obstructive sleep apnea    Paroxysmal atrial fibrillation (HCC)    Pulmonary hypertension (HCC)    PVC's (premature ventricular contractions)    Tobacco abuse     Patient Active Problem List   Diagnosis Date Noted   Vitamin D  deficiency 12/02/2023   Malignant neoplasm of prostate (HCC) 11/17/2023   Paroxysmal atrial fibrillation (HCC) 10/09/2023   Bilateral lower extremity edema 09/25/2023   Vertigo 08/29/2023   Elevated PSA 06/13/2023   Abnormal echocardiogram 08/24/2022   Stage 3a chronic kidney disease (HCC) 08/24/2022   HFrEF (heart failure with reduced ejection fraction) (HCC) 08/23/2022   Asthma 07/30/2022   Encounter to establish care 07/30/2022   Acute exacerbation of CHF (congestive heart failure) (HCC) 07/22/2022   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    NSVT (nonsustained ventricular tachycardia) 07/21/2021   Pulmonary hypertension, unspecified (HCC) 07/21/2021   Tobacco abuse 07/21/2021   Diabetes mellitus type 2 in nonobese (HCC) 07/21/2021   Acute on chronic combined systolic and diastolic CHF (congestive heart failure) (HCC)    Non-recurrent unilateral inguinal hernia without obstruction or gangrene    Hyperlipidemia 11/04/2012   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   Cholelithiasis    Obstructive sleep apnea 04/28/2012   Cardiomyopathy, nonischemic (HCC) 04/03/2012   Essential hypertension 08/07/2010   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/07/2010    Past Surgical History:  Procedure Laterality Date   CARDIAC CATHETERIZATION  10/15/2000   COLONOSCOPY N/A 03/22/2014   Procedure: COLONOSCOPY;  Surgeon: Alyce Jubilee, MD;  Location: [facility];  Service: Endoscopy;  Laterality: N/A;  11:15 AM   ICD IMPLANT N/A 08/21/2021   Procedure: ICD IMPLANT;  Surgeon: Tammie Fall, MD;  Location[facility] INVASIVE CV LAB;  Service: Cardiovascular;  Laterality: N/A;   INGUINAL HERNIA REPAIR Right 06/24/2017   Procedure: HERNIA REPAIR INGUINAL ADULT WITH MESH;  Surgeon: Alanda Allegra, MD;  Location: [facility] ORS;  Service: General;  Laterality: Right;   PROSTATE BIOPSY     RIGHT HEART CATH N/A 11/13/2022   Procedure: RIGHT HEART CATH;  Surgeon: Alwin Baars, DO;  Location: [facility] INVASIVE CV LAB;  Service: Cardiovascular;  Laterality: N/A;   RIGHT HEART CATH N/A 12/21/2022   Procedure: RIGHT HEART CATH;  Surgeon: Alwin Baars, DO;  Location: [facility] INVASIVE CV LAB;  Service: Cardiovascular;  Laterality: N/A;  RIGHT HEART CATH N/A 11/25/2023   Procedure: RIGHT HEART CATH;  Surgeon: Alwin Baars, DO;  Location: [facility] INVASIVE CV LAB;  Service: Cardiovascular;  Laterality: N/A;   RIGHT/LEFT HEART CATH AND CORONARY ANGIOGRAPHY N/A 07/19/2021   Procedure: RIGHT/LEFT HEART CATH AND CORONARY ANGIOGRAPHY;  Surgeon: Odie Benne, MD;   Location: [facility] INVASIVE CV LAB;  Service: Cardiovascular;  Laterality: N/A;   TEE WITHOUT CARDIOVERSION N/A 12/21/2022   Procedure: TRANSESOPHAGEAL ECHOCARDIOGRAM (TEE);  Surgeon: Alwin Baars, DO;  Location: [facility] ENDOSCOPY;  Service: Cardiovascular;  Laterality: N/A;       Home Medications    Prior to Admission medications   Medication Sig Start Date End Date Taking? Authorizing Provider  amoxicillin -clavulanate (AUGMENTIN ) 875-125 MG tablet Take 1 tablet by mouth every 12 (twelve) hours. 02/26/24  Yes Leath-Warren, Belen Bowers, NP  acetaminophen  (TYLENOL ) 500 MG tablet Take 1,000 mg by mouth every 6 (six) hours as needed for moderate pain.    [provider]  albuterol  (VENTOLIN  HFA) 108 (90 Base) MCG/ACT inhaler INHALE 2 PUFFS BY MOUTH FOUR TIMES DAILY AS NEEDED 08/21/23   [name], MD  apixaban  (ELIQUIS ) 5 MG TABS tablet Take 1 tablet (5 mg total) by mouth 2 (two) times daily. 12/13/23   Tammie Fall, MD  aspirin  EC 81 MG tablet Take 81 mg by mouth daily. Swallow whole.    [provider]  atorvastatin  (LIPITOR) 40 MG tablet TAKE 1 TABLET(40 MG) BY MOUTH DAILY 11/14/23   Sabharwal, Aditya, DO  carvedilol  (COREG ) 3.125 MG tablet Take 1 tablet (3.125 mg total) by mouth 2 (two) times daily with a meal. 01/07/24   Milford, Arlice Bene, FNP  dapagliflozin  propanediol (FARXIGA ) 10 MG TABS tablet Take 1 tablet (10 mg total) by mouth daily before breakfast. 03/22/23   Sabharwal, Aditya, DO  ENTRESTO  97-103 MG TAKE 1 TABLET BY MOUTH TWICE DAILY 11/04/23   Sabharwal, Aditya, DO  meclizine  (ANTIVERT ) 25 MG tablet Take 1 tablet (25 mg total) by mouth 3 (three) times daily as needed for dizziness. 12/02/23   Dixon, Phillip E, MD  metFORMIN (GLUCOPHAGE) 500 MG tablet TAKE 1 TABLET BY MOUTH TWICE DAILY 08/21/23   [name], MD  spironolactone  (ALDACTONE ) 25 MG tablet Take 1 tablet (25 mg total) by mouth at bedtime. 01/07/24   Elmarie Hacking, FNP  torsemide  (DEMADEX ) 20 MG  tablet TAKE 2 TABLETS(40 MG) BY MOUTH DAILY 01/13/24   [name], MD    Family History Family History  Problem Relation Age of Onset   Coronary artery disease Mother    Diabetes Father    Heart attack Father    Hypertension Father    Iron deficiency Father    Thyroid  disease Sister    Cardiomyopathy Brother        No significant coronary disease by coronary angiography   Heart disease Maternal Grandfather        Also paternal grandfather   Sudden death Other    Colon cancer Neg Hx     Social History Social History   Tobacco Use   Smoking status: Former    Current packs/day: 0.00    Average packs/day: 0.5 packs/day for 38.8 years (19.4 ttl pk-yrs)    Types: Cigarettes    Start date: 10/15/1982    Quit date: 07/18/2021    Years since quitting: 2.6   Smokeless tobacco: Never   Tobacco comments:    3 cigaretts a day  Vaping Use   Vaping status: Never  Used  Substance Use Topics   Alcohol use: No    Alcohol/week: 0.0 standard drinks of alcohol   Drug use: No     Allergies   Patient has no known allergies.   Review of Systems Review of Systems Per HPI  Physical Exam Triage Vital Signs ED Triage Vitals [02/26/24 1127]  Encounter Vitals Group     BP 108/71     Systolic BP Percentile      Diastolic BP Percentile      Pulse Rate (!) 56     Resp 20     Temp 98.1 F (36.7 C)     Temp Source Oral     SpO2 97 %     Weight      Height      Head Circumference      Peak Flow      Pain Score 8     Pain Loc      Pain Education      Exclude from Growth Chart    No data found.  Updated Vital Signs BP 108/71 (BP Location: Right Arm)   Pulse (!) 56   Temp 98.1 F (36.7 C) (Oral)   Resp 20   SpO2 97%   Visual Acuity Right Eye Distance:   Left Eye Distance:   Bilateral Distance:    Right Eye Near:   Left Eye Near:    Bilateral Near:     Physical Exam Vitals and nursing note reviewed.  Constitutional:      General: He is not in acute distress.     Appearance: He is well-developed.  HENT:     Head: Normocephalic.     Right Ear: Tympanic membrane, ear canal and external ear normal.     Left Ear: Tympanic membrane, ear canal and external ear normal.     Nose: Congestion present.     Right Turbinates: Enlarged and swollen.     Left Turbinates: Enlarged and swollen.     Right Sinus: Maxillary sinus tenderness present. No frontal sinus tenderness.     Left Sinus: Maxillary sinus tenderness present. No frontal sinus tenderness.     Mouth/Throat:     Lips: Pink.     Mouth: Mucous membranes are moist.     Pharynx: Postnasal drip present. No pharyngeal swelling, oropharyngeal exudate, posterior oropharyngeal erythema or uvula swelling.     Comments: Cobblestoning present to posterior oropharynx  Eyes:     Extraocular Movements: Extraocular movements intact.     Conjunctiva/sclera: Conjunctivae normal.     Pupils: Pupils are equal, round, and reactive to light.  Cardiovascular:     Rate and Rhythm: Regular rhythm. Bradycardia present.     Pulses: Normal pulses.     Heart sounds: Normal heart sounds.  Pulmonary:     Effort: Pulmonary effort is normal. No respiratory distress.     Breath sounds: Normal breath sounds. No stridor. No wheezing, rhonchi or rales.  Abdominal:     General: Bowel sounds are normal.     Palpations: Abdomen is soft.     Tenderness: There is no abdominal tenderness.  Musculoskeletal:     Cervical back: Normal range of motion.  Lymphadenopathy:     Cervical: No cervical adenopathy.  Skin:    General: Skin is warm and dry.  Neurological:     General: No focal deficit present.     Mental Status: He is alert and oriented to person, place, and time.  Psychiatric:  Mood and Affect: Mood normal.        Behavior: Behavior normal.      UC Treatments / Results  Labs (all labs ordered are listed, but only abnormal results are displayed) Labs Reviewed  POC COVID19/FLU A&B COMBO     EKG   Radiology No results found.  Procedures Procedures (including critical care time)  Medications Ordered in UC Medications - No data to display  Initial Impression / Assessment and Plan / UC Course  I have reviewed the triage vital signs and the nursing notes.  Pertinent labs & imaging results that were available during my care of the patient were reviewed by me and considered in my medical decision making (see chart for details).  COVID/flu test was negative.  On exam, lung sounds are clear throughout, room air sats at 97%.  Patient does have moderate maxillary sinus tenderness on exam.  Symptoms consistent with acute maxillary sinusitis.  Will treat with Augmentin  875/125 mg tablets twice daily for the next 7 days.  Supportive care recommendations were provided and discussed with the patient to include fluids, rest, over-the-counter analgesics, continuing use of Flonase , normal saline nasal spray, and use of a humidifier during sleep.  Discussed indications with patient regarding follow-up.  Patient was in agreement with this plan of care and verbalized understanding.  All questions were answered.  Patient stable for discharge.   Final Clinical Impressions(s) / UC Diagnoses   Final diagnoses:  Acute maxillary sinusitis, recurrence not specified     Discharge Instructions      The COVID test was negative. Take medication as directed. Continue Flonase  daily. Increase fluids and get plenty of rest. May take over-the-counter Tylenol  as needed for pain, fever, or general discomfort. Recommend normal saline nasal spray to help with nasal congestion throughout the day. For your cough, it may be helpful to use a humidifier at bedtime during sleep. If your symptoms fail to improve with this treatment, you may follow-up in this clinic or with your primary care physician for further evaluation. Follow-up as needed.   ED Prescriptions     Medication Sig Dispense Auth.  Provider   amoxicillin -clavulanate (AUGMENTIN ) 875-125 MG tablet Take 1 tablet by mouth every 12 (twelve) hours. 14 tablet Leath-Warren, Belen Bowers, NP      PDMP not reviewed this encounter.   Hardy Lia, NP 02/26/24 1157

## 2024-02-26 NOTE — Discharge Instructions (Signed)
 The COVID test was negative. Take medication as directed. Continue Flonase  daily. Increase fluids and get plenty of rest. May take over-the-counter Tylenol  as needed for pain, fever, or general discomfort. Recommend normal saline nasal spray to help with nasal congestion throughout the day. For your cough, it may be helpful to use a humidifier at bedtime during sleep. If your symptoms fail to improve with this treatment, you may follow-up in this clinic or with your primary care physician for further evaluation. Follow-up as needed.

## 2024-02-26 NOTE — ED Triage Notes (Addendum)
 Pt reports facial pressure, bilateral ear pressure, nasal congestion, headache, cough since last Friday. Reports otc medication is not helping.   Pt reports is currently undergoing radiation treatments for prostate cancer.

## 2024-02-27 ENCOUNTER — Telehealth (HOSPITAL_COMMUNITY): Payer: Self-pay | Admitting: Cardiology

## 2024-02-27 DIAGNOSIS — C61 Malignant neoplasm of prostate: Secondary | ICD-10-CM | POA: Diagnosis not present

## 2024-02-27 DIAGNOSIS — I428 Other cardiomyopathies: Secondary | ICD-10-CM | POA: Diagnosis not present

## 2024-02-27 DIAGNOSIS — Z87891 Personal history of nicotine dependence: Secondary | ICD-10-CM | POA: Diagnosis not present

## 2024-02-27 DIAGNOSIS — Z510 Encounter for antineoplastic radiation therapy: Secondary | ICD-10-CM | POA: Diagnosis not present

## 2024-02-27 DIAGNOSIS — N183 Chronic kidney disease, stage 3 unspecified: Secondary | ICD-10-CM | POA: Diagnosis not present

## 2024-02-27 DIAGNOSIS — E1122 Type 2 diabetes mellitus with diabetic chronic kidney disease: Secondary | ICD-10-CM | POA: Diagnosis not present

## 2024-02-27 DIAGNOSIS — I509 Heart failure, unspecified: Secondary | ICD-10-CM | POA: Diagnosis not present

## 2024-02-27 DIAGNOSIS — J449 Chronic obstructive pulmonary disease, unspecified: Secondary | ICD-10-CM | POA: Diagnosis not present

## 2024-02-27 DIAGNOSIS — I130 Hypertensive heart and chronic kidney disease with heart failure and stage 1 through stage 4 chronic kidney disease, or unspecified chronic kidney disease: Secondary | ICD-10-CM | POA: Diagnosis not present

## 2024-02-27 DIAGNOSIS — I480 Paroxysmal atrial fibrillation: Secondary | ICD-10-CM | POA: Diagnosis not present

## 2024-02-27 DIAGNOSIS — E7800 Pure hypercholesterolemia, unspecified: Secondary | ICD-10-CM | POA: Diagnosis not present

## 2024-02-27 NOTE — Telephone Encounter (Signed)
 Called to confirm/remind patient of their appointment at the [facility] on 02/27/24.   Appointment:   [x] Confirmed  [] Left mess   [] No answer/No voice mail  [] VM Full/unable to leave message  [] Phone not in service  Patient reminded to bring all medications and/or complete list.  Confirmed patient has transportation. Gave directions, instructed to utilize valet parking.

## 2024-02-28 ENCOUNTER — Ambulatory Visit (HOSPITAL_COMMUNITY)
Admission: RE | Admit: 2024-02-28 | Discharge: 2024-02-28 | Disposition: A | Discharge status: Home / Self Care | Source: Ambulatory Visit | Attending: Adult Health | Admitting: Adult Health

## 2024-02-28 ENCOUNTER — Encounter (HOSPITAL_COMMUNITY): Payer: Self-pay | Admitting: Cardiology

## 2024-02-28 VITALS — BP 100/60 | HR 69 | Wt 263.6 lb

## 2024-02-28 DIAGNOSIS — Q2543 Congenital aneurysm of aorta: Secondary | ICD-10-CM | POA: Diagnosis not present

## 2024-02-28 DIAGNOSIS — N1831 Chronic kidney disease, stage 3a: Secondary | ICD-10-CM | POA: Diagnosis not present

## 2024-02-28 DIAGNOSIS — E1122 Type 2 diabetes mellitus with diabetic chronic kidney disease: Secondary | ICD-10-CM | POA: Diagnosis not present

## 2024-02-28 DIAGNOSIS — I428 Other cardiomyopathies: Secondary | ICD-10-CM | POA: Insufficient documentation

## 2024-02-28 DIAGNOSIS — I480 Paroxysmal atrial fibrillation: Secondary | ICD-10-CM | POA: Diagnosis not present

## 2024-02-28 DIAGNOSIS — Z7901 Long term (current) use of anticoagulants: Secondary | ICD-10-CM | POA: Diagnosis not present

## 2024-02-28 DIAGNOSIS — I110 Hypertensive heart disease with heart failure: Secondary | ICD-10-CM | POA: Diagnosis not present

## 2024-02-28 DIAGNOSIS — I130 Hypertensive heart and chronic kidney disease with heart failure and stage 1 through stage 4 chronic kidney disease, or unspecified chronic kidney disease: Secondary | ICD-10-CM | POA: Diagnosis not present

## 2024-02-28 DIAGNOSIS — Z95810 Presence of automatic (implantable) cardiac defibrillator: Secondary | ICD-10-CM | POA: Insufficient documentation

## 2024-02-28 DIAGNOSIS — I4892 Unspecified atrial flutter: Secondary | ICD-10-CM | POA: Insufficient documentation

## 2024-02-28 DIAGNOSIS — Z7984 Long term (current) use of oral hypoglycemic drugs: Secondary | ICD-10-CM | POA: Diagnosis not present

## 2024-02-28 DIAGNOSIS — Z79899 Other long term (current) drug therapy: Secondary | ICD-10-CM | POA: Insufficient documentation

## 2024-02-28 DIAGNOSIS — Z87891 Personal history of nicotine dependence: Secondary | ICD-10-CM | POA: Insufficient documentation

## 2024-02-28 DIAGNOSIS — I5020 Unspecified systolic (congestive) heart failure: Secondary | ICD-10-CM | POA: Diagnosis not present

## 2024-02-28 DIAGNOSIS — I509 Heart failure, unspecified: Secondary | ICD-10-CM | POA: Insufficient documentation

## 2024-02-28 NOTE — Patient Instructions (Signed)
 There has been no changes to your medications.  Your physician has requested that you have an echocardiogram. Echocardiography is a painless test that uses sound waves to create images of your heart. It provides your doctor with information about the size and shape of your heart and how well your heart's chambers and valves are working. This procedure takes approximately one hour. There are no restrictions for this procedure. Please do NOT wear cologne, perfume, aftershave, or lotions (deodorant is allowed). Please arrive 15 minutes prior to your appointment time.  Please note: We ask at that you not bring children with you during ultrasound (echo/ vascular) testing. Due to room size and safety concerns, children are not allowed in the ultrasound rooms during exams. Our front office staff cannot provide observation of children in our lobby area while testing is being conducted. An adult accompanying a patient to their appointment will only be allowed in the ultrasound room at the discretion of the ultrasound technician under special circumstances. We apologize for any inconvenience.  Your physician recommends that you schedule a follow-up appointment in: 2 months with an echocardiogram   If you have any questions or concerns before your next appointment please send us  a message through [place] or call our office at [phone].    TO LEAVE A MESSAGE FOR THE NURSE SELECT OPTION 2, PLEASE LEAVE A MESSAGE INCLUDING: YOUR NAME DATE OF BIRTH CALL BACK NUMBER REASON FOR CALL**this is important as we prioritize the call backs  YOU WILL RECEIVE A CALL BACK THE SAME DAY AS LONG AS YOU CALL BEFORE 4:00 PM  At the [facility], you and your health needs are our priority. As part of our continuing mission to provide you with exceptional heart care, we have created designated Provider Care Teams. These Care Teams include your primary Cardiologist (physician) and Advanced Practice Providers  (APPs- Physician Assistants and Nurse Practitioners) who all work together to provide you with the care you need, when you need it.   You may see any of the following providers on your designated Care Team at your next follow up: Dr Jules Oar Dr Peder Bourdon Dr. Alwin Baars Dr. Arta Lark Amy Marijane Shoulders, NP Ruddy Corral, [place] [facility] [place], [place] Dennise Fitz, NP [name], NP Shawnee Dellen, NP Luster Salters, PharmD Bevely Brush, PharmD   Please be sure to bring in all your medications bottles to every appointment.    Thank you for choosing [facility]

## 2024-02-28 NOTE — Progress Notes (Addendum)
 ADVANCED HEART FAILURE CLINIC NOTE  Primary Care: [name], MD Primary Cardiologist: Janelle Mediate, MD HF Cardiologist: Alwin Baars, MD  HPI: James [name] is a 63 y.o. male with nonischemic cardiomyopathy s/p ICD, HTNm, T2DM, aortic root aneurysm. James [name] HF dates back to 2011 when he was diagnosed with nonischemic cardiomyopathy.  According to James [name] he came to the [facility] system in 2013 when he had a left heart cath with no coronary disease with a EF of 20%, he was started on low-dose GDMT at that time.  By 2013 his EF improved to 45%.  He followed yearly with Dr. Nishan until 2022 when he once again had a reduction in LVEF to 20 to 25% patient was started on Entresto  With a repeat heart cath demonstrating no significant coronary disease.    Since establishing care with us , James [name] has undergone evaluation for advanced therapies.  His CPX is suggestive of severe heart failure limitation and multiple right heart catheterizations demonstrate persistently reduced cardiac index less than 2 L/min/m.  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  Completed radiation for prostate cancer April 2025. He has also been referred to Neurology for mass noted on CT of head.   Today he returns for HF follow up with his wife. Overall feeling fine. Denies SOB/PND/Orthopnea. Able to walk up and down steps. Appetite ok. No fever or chills. Weight at home has been stable. Taking all medications. He works full time with no issues.   Past Medical History:  Diagnosis Date   CKD (chronic kidney disease) stage 2, GFR 60-89 ml/min    COPD (chronic obstructive pulmonary disease) (HCC)    Diabetes mellitus type II, non insulin  dependent (HCC)    Dilated aortic root (HCC)    Elevated PSA    Hypercholesteremia    Hypertension    NICM (nonischemic cardiomyopathy) (HCC)    NSVT (nonsustained  ventricular tachycardia) (HCC)    Obstructive sleep apnea    Paroxysmal atrial fibrillation (HCC)    Pulmonary hypertension (HCC)    PVC's (premature ventricular contractions)    Tobacco abuse    Current Outpatient Medications  Medication Sig Dispense Refill   acetaminophen  (TYLENOL ) 500 MG tablet Take 1,000 mg by mouth every 6 (six) hours as needed for moderate pain.     albuterol  (VENTOLIN  HFA) 108 (90 Base) MCG/ACT inhaler INHALE 2 PUFFS BY MOUTH FOUR TIMES DAILY AS NEEDED 6.7 g 1   apixaban  (ELIQUIS ) 5 MG TABS tablet Take 1 tablet (5 mg total) by mouth 2 (two) times daily. 60 tablet 11   aspirin  EC 81 MG tablet Take 81 mg by mouth daily. Swallow whole.     atorvastatin  (LIPITOR) 40 MG tablet TAKE 1 TABLET(40 MG) BY MOUTH DAILY 90 tablet 3   carvedilol  (COREG ) 3.125 MG tablet Take 1 tablet (3.125 mg total) by mouth 2 (two) times daily with a meal. 60 tablet 5   dapagliflozin  propanediol (FARXIGA ) 10 MG TABS tablet Take 1 tablet (10 mg total) by mouth daily before breakfast. 90 tablet 3   ENTRESTO  97-103 MG TAKE 1 TABLET BY MOUTH TWICE DAILY 60 tablet 11   meclizine  (ANTIVERT ) 25 MG tablet Take 1 tablet (25 mg total) by mouth 3 (three) times daily as needed for dizziness. 30 tablet 0   metFORMIN (GLUCOPHAGE) 500 MG tablet TAKE 1 TABLET BY MOUTH TWICE DAILY 180 tablet 1  spironolactone  (ALDACTONE ) 25 MG tablet Take 1 tablet (25 mg total) by mouth at bedtime. 90 tablet 3   torsemide  (DEMADEX ) 20 MG tablet TAKE 2 TABLETS(40 MG) BY MOUTH DAILY 60 tablet 2   No current facility-administered medications for this encounter.   Facility-Administered Medications Ordered in Other Encounters  Medication Dose Route Frequency Provider Last Rate Last Admin   sodium chloride  flush (NS) 0.9 % injection 3 mL  3 mL Intravenous Q12H Sabharwal, Aditya, DO       No Known Allergies   Social History   Socioeconomic History   Marital status: Married    Spouse name: James [name]   Number of children:  4   Years of education: Not on file   Highest education level: Associate degree: occupational, Scientist, product/process development, or vocational program  Occupational History   Occupation: Merchandiser, retail    Comment: [employer]  Tobacco Use   Smoking status: Former    Current packs/day: 0.00    Average packs/day: 0.5 packs/day for 38.8 years (19.4 ttl pk-yrs)    Types: Cigarettes    Start date: 10/15/1982    Quit date: 07/18/2021    Years since quitting: 2.6   Smokeless tobacco: Never   Tobacco comments:    3 cigaretts a day  Vaping Use   Vaping status: Never Used  Substance and Sexual Activity   Alcohol use: No    Alcohol/week: 0.0 standard drinks of alcohol   Drug use: No   Sexual activity: Yes    Birth control/protection: None  Other Topics Concern   Not on file  Social History Narrative   Divorced   No regular exercise   Social Drivers of Health   Financial Resource Strain: Low Risk  (07/21/2021)   Overall Financial Resource Strain (CARDIA)    Difficulty of Paying Living Expenses: Not very hard  Food Insecurity: No Food Insecurity (02/27/2024)   Received [facility]   Hunger Vital Sign    Worried About Running Out of Food in the Last Year: Never true    Ran Out of Food in the Last Year: Never true  Transportation Needs: No Transportation Needs (02/27/2024)   Received from [facility]   PRAPARE - Transportation    Lack of Transportation (Medical): No    Lack of Transportation (Non-Medical): No  Physical Activity: Not on file  Stress: Not on file  Social Connections: Not on file  Intimate Partner Violence: Not At Risk (11/18/2023)   Humiliation, Afraid, Rape, and Kick questionnaire    Fear of Current or Ex-Partner: No    Emotionally Abused: No    Physically Abused: No    Sexually Abused: No    Family History  Problem Relation Age of Onset   Coronary artery disease Mother    Diabetes Father    Heart attack Father    Hypertension Father    Iron deficiency Father    Thyroid   disease Sister    Cardiomyopathy Brother        No significant coronary disease by coronary angiography   Heart disease Maternal Grandfather        Also paternal grandfather   Sudden death Other    Colon cancer Neg Hx     BP 100/60   Pulse 69   Wt 119.6 kg (263 lb 9.6 oz)   SpO2 98%   BMI 30.46 kg/m  Wt Readings from Last 3 Encounters:  02/28/24 119.6 kg (263 lb 9.6 oz)  02/26/24 115.2 kg (254 lb)  01/21/24 116.1 kg (256 lb)    PHYSICAL EXAM: General:   No resp difficulty Neck: supple. no JVD.  Cor: PMI nondisplaced. Regular rate & rhythm. No rubs, gallops or murmurs. Lungs: clear Abdomen: soft, nontender, nondistended.  Extremities: no cyanosis, clubbing, rash, edema Neuro: alert & oriented x3  ECHO: 07/23/2022: LVEF 20 to 25%, severely dilated, RV function moderately reduced, LA severely dilated, mild MR. 10/22/22: LVEF 25-30% 12/21/22 (TEE): LVEF 20-25%, RV function mildly reduced. Aneurysmal aortic root at [facility].  10/11/23: LVEF 20%, moderately reduced RV function   CATH: RHC/LHC (07/19/21): No angiographic evidence of CAD Elevated right and left heart pressures (LV: 130/22/39, RA 15, RV 82/13/20, PA 83/30 mean 49, PCWP 35).   RHC: 12/21/22: HEMODYNAMICS: RA:                  8 mmHg (mean) RV:                  83/7 mmHg PA:                  79/34 mmHg (51 mean) PCWP:            25 mmHg (mean)                                      Estimated Fick CO/CI   3.3 L/min, 1.3 L/min/m2         Thermodilution CO/CI   4.9 L/min, 1.9 L/min/m2                                     TPG                 26  mmHg                                            PVR                 5.3-8.8 Wood Units  PAPi                5.6    RHC 11/2023  RA:                  6 mmHg (mean) RV:                  44/6 mmHg PA:                  43/19 mmHg (30 mean) PCWP:            17 mmHg (mean)                                     Estimated Fick CO/CI   4.2 L/min, 1.7 L/min/m2 Thermodilution CO/CI   5.34  L/min, 2.2 L/min/m2                                           TPG  13  mmHg                                            PVR                 2.5-3.1 Wood Units  PAPi                4    IMPRESSION: Mildly elevated pre and post capillary filling pressures Mildly elevated PA mean & PVR Moderate to severely reduced cardiac output & index  ASSESSMENT & PLAN:  NYHA Class I-II nonischemic cardiomyopathy Etiology of HF: Nonischemic cardiomyopathy likely idiopathic dilated. However cannot rule out infiltrative disease. NYHA class / AHA Stage: NYHA I. Functionally has not limitations.   Volume status & Diuretics: Appears euvolemic. Continue taking torsemide  40 mg daily.  Vasodilators: Continue Entresto  97/103 mg bid.  Beta-Blocker: Contiue Coreg  to 3.125 mg bid.  MRA: Continue spironolactone  25 mg but move to at bedtime. Cardiometabolic: Continue Farxiga  10 mg daily. Devices therapies & Valvulopathies: Primary prevention ICD in place. Advanced therapies: Right heart catheterization and CPX consistent with stage D heart failure with severe heart failure limitations.  He has completed most of his LVAD evaluation and has been discussed at [facility].  - 11/06/22: EF remains severely reduced with now one hospitalization for HFrEF exacerbation.  Despite severely reduced EF he is asymptomatic Repeat Echo next visit.    2. CKD Stage IIIa - Continue farxiga  10 mg daily.  -I reviewed BMET 02/26/24, stable.   3. Aortic root aneurysm - 5 cm by echo in 10/23.  - Stable.   4. Paroxysmal atrial fibrillation/flutter - Regular on exam.  - Continue Eliquis  5 mg bid.   Follow up in 2 months with Dr Bruce Caper with an ECHO  Jarmar Rousseau NP-C  10:53 AM  02/28/24

## 2024-03-02 DIAGNOSIS — G4733 Obstructive sleep apnea (adult) (pediatric): Secondary | ICD-10-CM | POA: Diagnosis not present

## 2024-03-03 ENCOUNTER — Ambulatory Visit: Payer: BC Managed Care – PPO | Admitting: Internal Medicine

## 2024-03-11 ENCOUNTER — Encounter: Payer: Self-pay | Admitting: Internal Medicine

## 2024-03-11 DIAGNOSIS — M79672 Pain in left foot: Secondary | ICD-10-CM | POA: Diagnosis not present

## 2024-03-11 DIAGNOSIS — L110 Acquired keratosis follicularis: Secondary | ICD-10-CM | POA: Diagnosis not present

## 2024-03-11 DIAGNOSIS — M79671 Pain in right foot: Secondary | ICD-10-CM | POA: Diagnosis not present

## 2024-03-11 DIAGNOSIS — L565 Disseminated superficial actinic porokeratosis (DSAP): Secondary | ICD-10-CM | POA: Diagnosis not present

## 2024-03-12 ENCOUNTER — Telehealth: Payer: Self-pay

## 2024-03-12 NOTE — Telephone Encounter (Signed)
 Spoke w/ pt. Verified address. Made aware that we will be sending new monitor. I apologized for the continued issues and hope we can resolve this with a new monitor. Pt was very appreciative.

## 2024-03-12 NOTE — Telephone Encounter (Signed)
 Unable to LM. Will try again @ 12PM.

## 2024-03-12 NOTE — Telephone Encounter (Signed)
 Alert received in [employer]:    This is a repeated issue that has not been resolved despite multiple attempts to resolve this. I messaged the company rep and we will be sending a monitor overnight once I confirm current address later this morning.

## 2024-03-24 ENCOUNTER — Encounter: Payer: Self-pay | Admitting: Internal Medicine

## 2024-03-24 ENCOUNTER — Ambulatory Visit (INDEPENDENT_AMBULATORY_CARE_PROVIDER_SITE_OTHER): Admitting: Internal Medicine

## 2024-03-24 VITALS — BP 95/62 | HR 80 | Ht 78.0 in | Wt 262.2 lb

## 2024-03-24 DIAGNOSIS — E559 Vitamin D deficiency, unspecified: Secondary | ICD-10-CM

## 2024-03-24 DIAGNOSIS — I10 Essential (primary) hypertension: Secondary | ICD-10-CM

## 2024-03-24 DIAGNOSIS — I5020 Unspecified systolic (congestive) heart failure: Secondary | ICD-10-CM | POA: Diagnosis not present

## 2024-03-24 DIAGNOSIS — G9389 Other specified disorders of brain: Secondary | ICD-10-CM

## 2024-03-24 DIAGNOSIS — G4733 Obstructive sleep apnea (adult) (pediatric): Secondary | ICD-10-CM | POA: Diagnosis not present

## 2024-03-24 DIAGNOSIS — I480 Paroxysmal atrial fibrillation: Secondary | ICD-10-CM | POA: Diagnosis not present

## 2024-03-24 DIAGNOSIS — Z7984 Long term (current) use of oral hypoglycemic drugs: Secondary | ICD-10-CM

## 2024-03-24 DIAGNOSIS — C61 Malignant neoplasm of prostate: Secondary | ICD-10-CM

## 2024-03-24 DIAGNOSIS — E119 Type 2 diabetes mellitus without complications: Secondary | ICD-10-CM

## 2024-03-24 DIAGNOSIS — E785 Hyperlipidemia, unspecified: Secondary | ICD-10-CM

## 2024-03-24 DIAGNOSIS — N1831 Chronic kidney disease, stage 3a: Secondary | ICD-10-CM

## 2024-03-24 MED ORDER — ATORVASTATIN CALCIUM 40 MG PO TABS: 40.0000 mg | ORAL_TABLET | Freq: Every day | ORAL | 3 refills | Status: DC

## 2024-03-24 NOTE — Assessment & Plan Note (Signed)
 CT venogram head from 5/14 showed a 0.9 cm enhancing mass along the left aspect of the anterior falx with concern for meningioma versus metastases from known prostate cancer.  Per patient, he was evaluated by radiation oncology at [facility] on 5/15 and was referred to [facility].  He has not been able to establish care.  We will follow-up on the status of this referral today.

## 2024-03-24 NOTE — Assessment & Plan Note (Signed)
 A1c 7.2 on labs from December 2024.  He is currently prescribed metformin and Farxiga .  Repeat A1c and urine microalbumin/creatinine ratio ordered today.

## 2024-03-24 NOTE — Assessment & Plan Note (Signed)
 Regular rate and rhythm detected on exam again today.  He remains on Eliquis  and carvedilol .

## 2024-03-24 NOTE — Progress Notes (Signed)
 Established Patient Office Visit  Subjective   Patient ID: James [name], male    DOB: 11-Mar-1961  Age: 63 y.o. MRN: [id number]  Chief Complaint  Patient presents with   Care Management    Three month follow up    Dizziness    Dizzy spells   James [name] returns to care today for routine follow-up.  He was last evaluated by me on 2/17 no medication changes were made at that time and 78-month follow-up was arranged.  In the interim he has been seen by urology for follow-up as well as cardiology for follow-up on multiple occasions.  ER presentation 5/14 in the setting of a headache.  Treated supportively.  CT head revealed an enhancing mass along the left aspect of the anterior falx concerning for meningioma.  Seen by radiation oncology the following day and was referred to [place] [facility].  There have otherwise been no acute interval events.  Today he reports feeling well and has no acute concerns to discuss.  Past Medical History:  Diagnosis Date   CKD (chronic kidney disease) stage 2, GFR 60-89 ml/min    COPD (chronic obstructive pulmonary disease) (HCC)    Diabetes mellitus type II, non insulin  dependent (HCC)    Dilated aortic root (HCC)    Elevated PSA    Hypercholesteremia    Hypertension    NICM (nonischemic cardiomyopathy) (HCC)    NSVT (nonsustained ventricular tachycardia) (HCC)    Obstructive sleep apnea    Paroxysmal atrial fibrillation (HCC)    Pulmonary hypertension (HCC)    PVC's (premature ventricular contractions)    Tobacco abuse    Past Surgical History:  Procedure Laterality Date   CARDIAC CATHETERIZATION  10/15/2000   COLONOSCOPY N/A 03/22/2014   Procedure: COLONOSCOPY;  Surgeon: Alyce Jubilee, MD;  Location: [facility] ENDO SUITE;  Service: Endoscopy;  Laterality: N/A;  11:15 AM   ICD IMPLANT N/A 08/21/2021   Procedure: ICD IMPLANT;  Surgeon: Tammie Fall, MD;  Location: [facility] INVASIVE CV LAB;  Service: Cardiovascular;  Laterality: N/A;    INGUINAL HERNIA REPAIR Right 06/24/2017   Procedure: HERNIA REPAIR INGUINAL ADULT WITH MESH;  Surgeon: Alanda Allegra, MD;  Location: [facility] ORS;  Service: General;  Laterality: Right;   PROSTATE BIOPSY     RIGHT HEART CATH N/A 11/13/2022   Procedure: RIGHT HEART CATH;  Surgeon: Alwin Baars, DO;  Location: [facility] INVASIVE CV LAB;  Service: Cardiovascular;  Laterality: N/A;   RIGHT HEART CATH N/A 12/21/2022   Procedure: RIGHT HEART CATH;  Surgeon: Alwin Baars, DO;  Location: [facility] INVASIVE CV LAB;  Service: Cardiovascular;  Laterality: N/A;   RIGHT HEART CATH N/A 11/25/2023   Procedure: RIGHT HEART CATH;  Surgeon: Alwin Baars, DO;  Location: [facility] INVASIVE CV LAB;  Service: Cardiovascular;  Laterality: N/A;   RIGHT/LEFT HEART CATH AND CORONARY ANGIOGRAPHY N/A 07/19/2021   Procedure: RIGHT/LEFT HEART CATH AND CORONARY ANGIOGRAPHY;  Surgeon: Odie Benne, MD;  Location: [facility] INVASIVE CV LAB;  Service: Cardiovascular;  Laterality: N/A;   TEE WITHOUT CARDIOVERSION N/A 12/21/2022   Procedure: TRANSESOPHAGEAL ECHOCARDIOGRAM (TEE);  Surgeon: Alwin Baars, DO;  Location: [facility] ENDOSCOPY;  Service: Cardiovascular;  Laterality: N/A;   Social History   Tobacco Use   Smoking status: Former    Current packs/day: 0.00    Average packs/day: 0.5 packs/day for 38.8 years (19.4 ttl pk-yrs)    Types: Cigarettes    Start date: 10/15/1982    Quit date: 07/18/2021    Years  since quitting: 2.6   Smokeless tobacco: Never   Tobacco comments:    3 cigaretts a day  Vaping Use   Vaping status: Never Used  Substance Use Topics   Alcohol use: No    Alcohol/week: 0.0 standard drinks of alcohol   Drug use: No   Family History  Problem Relation Age of Onset   Coronary artery disease Mother    Diabetes Father    Heart attack Father    Hypertension Father    Iron deficiency Father    Thyroid  disease Sister    Cardiomyopathy Brother        No significant coronary disease by coronary angiography    Heart disease Maternal Grandfather        Also paternal grandfather   Sudden death Other    Colon cancer Neg Hx    No Known Allergies  Review of Systems  Constitutional:  Negative for chills and fever.  HENT:  Negative for sore throat.   Respiratory:  Negative for cough and shortness of breath.   Cardiovascular:  Negative for chest pain, palpitations and leg swelling.  Gastrointestinal:  Negative for abdominal pain, blood in stool, constipation, diarrhea, nausea and vomiting.  Genitourinary:  Negative for dysuria and hematuria.  Musculoskeletal:  Negative for myalgias.  Skin:  Negative for itching and rash.  Neurological:  Negative for dizziness and headaches.  Psychiatric/Behavioral:  Negative for depression and suicidal ideas.      Objective:     BP 95/62   Pulse 80   Ht 6\' 6"  (1.981 m)   Wt 262 lb 3.2 oz (118.9 kg)   SpO2 97%   BMI 30.30 kg/m  BP Readings from Last 3 Encounters:  03/24/24 95/62  02/28/24 100/60  02/26/24 128/85   Physical Exam Vitals reviewed.  Constitutional:      General: He is not in acute distress.    Appearance: Normal appearance. He is not ill-appearing.  HENT:     Head: Normocephalic and atraumatic.     Right Ear: External ear normal.     Left Ear: External ear normal.     Nose: Nose normal. No congestion or rhinorrhea.     Mouth/Throat:     Mouth: Mucous membranes are moist.     Pharynx: Oropharynx is clear.  Eyes:     General: No scleral icterus.    Extraocular Movements: Extraocular movements intact.     Conjunctiva/sclera: Conjunctivae normal.     Pupils: Pupils are equal, round, and reactive to light.  Cardiovascular:     Rate and Rhythm: Normal rate and regular rhythm.     Pulses: Normal pulses.     Heart sounds: Normal heart sounds. No murmur heard. Pulmonary:     Effort: Pulmonary effort is normal.     Breath sounds: Normal breath sounds. No wheezing, rhonchi or rales.  Abdominal:     General: Abdomen is flat. Bowel  sounds are normal. There is no distension.     Palpations: Abdomen is soft.     Tenderness: There is no abdominal tenderness.  Musculoskeletal:        General: No swelling or deformity. Normal range of motion.     Cervical back: Normal range of motion.  Skin:    General: Skin is warm and dry.     Capillary Refill: Capillary refill takes less than 2 seconds.  Neurological:     General: No focal deficit present.     Mental Status: He is alert and oriented to  person, place, and time.     Motor: No weakness.  Psychiatric:        Mood and Affect: Mood normal.        Behavior: Behavior normal.        Thought Content: Thought content normal.   Last CBC Lab Results  Component Value Date   WBC 7.2 02/26/2024   HGB 12.9 (L) 02/26/2024   HCT 37.5 (L) 02/26/2024   MCV 97.2 02/26/2024   MCH 33.4 02/26/2024   RDW 13.9 02/26/2024   PLT 178 02/26/2024   Last metabolic panel Lab Results  Component Value Date   GLUCOSE 98 02/26/2024   NA 138 02/26/2024   K 4.3 02/26/2024   CL 100 02/26/2024   CO2 25 01/07/2024   BUN 30 (H) 02/26/2024   CREATININE 1.50 (H) 02/26/2024   GFRNONAA 59 (L) 01/07/2024   CALCIUM  9.0 01/07/2024   PROT 6.4 (L) 03/22/2023   ALBUMIN 3.7 03/22/2023   LABGLOB 2.6 07/30/2022   AGRATIO 1.6 07/30/2022   BILITOT 1.1 03/22/2023   ALKPHOS 73 03/22/2023   AST 22 03/22/2023   ALT 22 03/22/2023   ANIONGAP 10 01/07/2024   Last lipids Lab Results  Component Value Date   CHOL 141 03/21/2023   HDL 43 03/21/2023   LDLCALC 74 03/21/2023   TRIG 136 03/21/2023   CHOLHDL 3.3 03/21/2023   Last hemoglobin A1c Lab Results  Component Value Date   HGBA1C 7.2 (H) 10/09/2023   Last thyroid  functions Lab Results  Component Value Date   TSH 1.370 09/25/2023   Last vitamin D  Lab Results  Component Value Date   VD25OH 16.2 (L) 09/02/2023   Last vitamin B12 and Folate Lab Results  Component Value Date   VITAMINB12 860 09/02/2023   The 10-year ASCVD risk score  (Arnett DK, et al., 2019) is: 15.2%    Assessment & Plan:   Problem List Items Addressed This Visit       HFrEF (heart failure with reduced ejection fraction) (HCC) (Chronic)   Stable.  On appropriate GDMT.  He endorses intermittent orthostasis but states that symptoms are stable overall.  Euvolemic on exam.  Follow-up with the advanced heart failure clinic as scheduled for 7/7.      Paroxysmal atrial fibrillation (HCC)   Regular rate and rhythm detected on exam again today.  He remains on Eliquis  and carvedilol .      Obstructive sleep apnea   Moderate OSA on sleep study from June 2013.  He continues to endorse intermittent compliance with CPAP.  We again reviewed the importance of nightly compliance with CPAP.      Diabetes mellitus type 2 in nonobese (HCC)   A1c 7.2 on labs from December 2024.  He is currently prescribed metformin and Farxiga .  Repeat A1c and urine microalbumin/creatinine ratio ordered today.      Stage 3a chronic kidney disease (HCC)   Renal function stable on labs from last month.  He remains on Arni and SGLT2i      Malignant neoplasm of prostate (HCC)   Stage IIb prostate cancer.  Followed by radiation oncology and recently completed radiation treatment.  Last seen by radiation oncology for follow-up 5/15 and was referred to neurosurgery in the setting of an enhancing mass noted on the left anterior falx on CT venogram head from 5/14.  He has not been able to establish care.  We will follow-up on the status of this referral today.      Hyperlipidemia   He  is currently prescribed atorvastatin  40 mg daily.  Repeat lipid panel ordered today.      Vitamin D  deficiency   Noted on previous labs.  Repeat vitamin D  level ordered today.      Mass of brain   CT venogram head from 5/14 showed a 0.9 cm enhancing mass along the left aspect of the anterior falx with concern for meningioma versus metastases from known prostate cancer.  Per patient, he was evaluated by  radiation oncology at [facility] on 5/15 and was referred to [facility].  He has not been able to establish care.  We will follow-up on the status of this referral today.       Return in about 3 months (around 06/24/2024).    [name], MD

## 2024-03-24 NOTE — Assessment & Plan Note (Signed)
 Noted on previous labs.  Repeat vitamin D level ordered today

## 2024-03-24 NOTE — Assessment & Plan Note (Signed)
 Stable.  On appropriate GDMT.  He endorses intermittent orthostasis but states that symptoms are stable overall.  Euvolemic on exam.  Follow-up with the advanced heart failure clinic as scheduled for 7/7.

## 2024-03-24 NOTE — Assessment & Plan Note (Signed)
 Renal function stable on labs from last month.  He remains on Arni and SGLT2i

## 2024-03-24 NOTE — Patient Instructions (Signed)
 It was a pleasure to see you today.  Thank you for giving us  the opportunity to be involved in your care.  Below is a brief recap of your visit and next steps.  We will plan to see you again in 3 months.  Summary No medication changes today Repeat labs ordered Will follow up on neurosurgery referral Follow up in 3 months

## 2024-03-24 NOTE — Assessment & Plan Note (Signed)
 Moderate OSA on sleep study from June 2013.  He continues to endorse intermittent compliance with CPAP.  We again reviewed the importance of nightly compliance with CPAP.

## 2024-03-24 NOTE — Assessment & Plan Note (Signed)
 Stage IIb prostate cancer.  Followed by radiation oncology and recently completed radiation treatment.  Last seen by radiation oncology for follow-up 5/15 and was referred to neurosurgery in the setting of an enhancing mass noted on the left anterior falx on CT venogram head from 5/14.  He has not been able to establish care.  We will follow-up on the status of this referral today.

## 2024-03-24 NOTE — Assessment & Plan Note (Signed)
 He is currently prescribed atorvastatin  40 mg daily.  Repeat lipid panel ordered today.

## 2024-03-25 ENCOUNTER — Ambulatory Visit: Payer: Self-pay | Admitting: Internal Medicine

## 2024-03-26 LAB — VITAMIN D 25 HYDROXY (VIT D DEFICIENCY, FRACTURES): Vit D, 25-Hydroxy: 26.1 ng/mL — ABNORMAL LOW (ref 30.0–100.0)

## 2024-03-26 LAB — MICROALBUMIN / CREATININE URINE RATIO
Creatinine, Urine: 56.6 mg/dL
Microalb/Creat Ratio: 77 mg/g{creat} — ABNORMAL HIGH (ref 0–29)
Microalbumin, Urine: 43.3 ug/mL

## 2024-03-26 LAB — LIPID PANEL
Chol/HDL Ratio: 2.9 ratio (ref 0.0–5.0)
Cholesterol, Total: 152 mg/dL (ref 100–199)
HDL: 52 mg/dL (ref >39)
LDL Chol Calc (NIH): 80 mg/dL (ref 0–99)
Triglycerides: 114 mg/dL (ref 0–149)
VLDL Cholesterol Cal: 20 mg/dL (ref 5–40)

## 2024-03-26 LAB — HEMOGLOBIN A1C
Est. average glucose Bld gHb Est-mCnc: 128 mg/dL
Hgb A1c MFr Bld: 6.1 % — ABNORMAL HIGH (ref 4.8–5.6)

## 2024-03-28 ENCOUNTER — Other Ambulatory Visit (HOSPITAL_COMMUNITY): Payer: Self-pay | Admitting: Cardiology

## 2024-03-28 DIAGNOSIS — I5020 Unspecified systolic (congestive) heart failure: Secondary | ICD-10-CM

## 2024-03-28 DIAGNOSIS — E119 Type 2 diabetes mellitus without complications: Secondary | ICD-10-CM

## 2024-03-28 DIAGNOSIS — I428 Other cardiomyopathies: Secondary | ICD-10-CM

## 2024-04-02 DIAGNOSIS — G4733 Obstructive sleep apnea (adult) (pediatric): Secondary | ICD-10-CM | POA: Diagnosis not present

## 2024-04-19 NOTE — Progress Notes (Incomplete)
 ADVANCED HEART FAILURE CLINIC NOTE  Primary Care: Bevely Doffing, FNP Primary Cardiologist: Maude Emmer, MD HF Cardiologist: Ria Commander, MD  HPI: James [name] is a 63 y.o. male with nonischemic cardiomyopathy s/p ICD, HTNm, T2DM, aortic root aneurysm. Mr. Lobue HF dates back to 2011 when he was diagnosed with nonischemic cardiomyopathy.  According to Mr. Carlisi he came to the [facility] system in 2013 when he had a left heart cath with no coronary disease with a EF of 20%, he was started on low-dose GDMT at that time.  By 2013 his EF improved to 45%.  He followed yearly with Dr. Nishan until 2022 when he once again had a reduction in LVEF to 20 to 25% patient was started on Entresto  With a repeat heart cath demonstrating no significant coronary disease.    Since establishing care with us , Mr. Hyser has undergone evaluation for advanced therapies.  His CPX is suggestive of severe heart failure limitation and multiple right heart catheterizations demonstrate persistently reduced cardiac index less than 2 L/min/m.  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  Completed radiation for prostate cancer April 2025. He has also been referred to Neurology for mass noted on CT of head.   Today he returns for HF follow up with his wife. Overall feeling fine. Denies SOB/PND/Orthopnea. Able to walk up and down steps. Appetite ok. No fever or chills. Weight at home has been stable. Taking all medications. He works full time with no issues.   Past Medical History:  Diagnosis Date   CKD (chronic kidney disease) stage 2, GFR 60-89 ml/min    COPD (chronic obstructive pulmonary disease) (HCC)    Diabetes mellitus type II, non insulin  dependent (HCC)    Dilated aortic root (HCC)    Elevated PSA    Hypercholesteremia    Hypertension    NICM (nonischemic cardiomyopathy) (HCC)    NSVT (nonsustained  ventricular tachycardia) (HCC)    Obstructive sleep apnea    Paroxysmal atrial fibrillation (HCC)    Pulmonary hypertension (HCC)    PVC's (premature ventricular contractions)    Tobacco abuse    Current Outpatient Medications  Medication Sig Dispense Refill   acetaminophen  (TYLENOL ) 500 MG tablet Take 1,000 mg by mouth every 6 (six) hours as needed for moderate pain.     albuterol  (VENTOLIN  HFA) 108 (90 Base) MCG/ACT inhaler INHALE 2 PUFFS BY MOUTH FOUR TIMES DAILY AS NEEDED 6.7 g 1   apixaban  (ELIQUIS ) 5 MG TABS tablet Take 1 tablet (5 mg total) by mouth 2 (two) times daily. 60 tablet 11   aspirin  EC 81 MG tablet Take 81 mg by mouth daily. Swallow whole.     atorvastatin  (LIPITOR) 40 MG tablet Take 1 tablet (40 mg total) by mouth daily. 90 tablet 3   carvedilol  (COREG ) 3.125 MG tablet Take 1 tablet (3.125 mg total) by mouth 2 (two) times daily with a meal. 60 tablet 5   ENTRESTO  97-103 MG TAKE 1 TABLET BY MOUTH TWICE DAILY 60 tablet 11   FARXIGA  10 MG TABS tablet TAKE 1 TABLET(10 MG) BY MOUTH DAILY BEFORE BREAKFAST 90 tablet 3   meclizine  (ANTIVERT ) 25 MG tablet Take 1 tablet (25 mg total) by mouth 3 (three) times daily as needed for dizziness. 30 tablet 0   metFORMIN (GLUCOPHAGE) 500 MG tablet TAKE 1 TABLET BY MOUTH TWICE DAILY 180 tablet 1   ORGOVYX  120 MG tablet      spironolactone  (ALDACTONE ) 25 MG tablet Take 1 tablet (25 mg total) by mouth at bedtime. 90 tablet 3   torsemide  (DEMADEX ) 20 MG tablet TAKE 2 TABLETS(40 MG) BY MOUTH DAILY 60 tablet 2   No current facility-administered medications for this visit.   Facility-Administered Medications Ordered in Other Visits  Medication Dose Route Frequency Provider Last Rate Last Admin   sodium chloride  flush (NS) 0.9 % injection 3 mL  3 mL Intravenous Q12H Dazia Lippold, DO        There were no vitals taken for this visit. Wt Readings from Last 3 Encounters:  03/24/24 118.9 kg (262 lb 3.2 oz)  02/28/24 119.6 kg (263 lb 9.6 oz)   02/26/24 115.2 kg (254 lb)    PHYSICAL EXAM: There were no vitals filed for this visit. GENERAL: NAD Lungs- *** CARDIAC:  JVP: *** cm          Normal rate with regular rhythm. *** murmur.  Pulses ***. *** edema.  ABDOMEN: Soft, non-tender, non-distended.  EXTREMITIES: Warm and well perfused.  NEUROLOGIC: No obvious FND  ECHO: 07/23/2022: LVEF 20 to 25%, severely dilated, RV function moderately reduced, LA severely dilated, mild MR. 10/22/22: LVEF 25-30% 12/21/22 (TEE): LVEF 20-25%, RV function mildly reduced. Aneurysmal aortic root at [facility].  10/11/23: LVEF 20%, moderately reduced RV function   CATH: RHC/LHC (07/19/21): No angiographic evidence of CAD Elevated right and left heart pressures (LV: 130/22/39, RA 15, RV 82/13/20, PA 83/30 mean 49, PCWP 35).   RHC: 12/21/22: HEMODYNAMICS: RA:                  8 mmHg (mean) RV:                  83/7 mmHg PA:                  79/34 mmHg (51 mean) PCWP:            25 mmHg (mean)                                      Estimated Fick CO/CI   3.3 L/min, 1.3 L/min/m2         Thermodilution CO/CI   4.9 L/min, 1.9 L/min/m2                                     TPG                 26  mmHg                                            PVR                 5.3-8.8 Wood Units  PAPi                5.6    RHC 11/2023  RA:                  6 mmHg (mean) RV:                  44/6 mmHg PA:  43/19 mmHg (30 mean) PCWP:            17 mmHg (mean)                                     Estimated Fick CO/CI   4.2 L/min, 1.7 L/min/m2 Thermodilution CO/CI   5.34 L/min, 2.2 L/min/m2                                           TPG                 13  mmHg                                            PVR                 2.5-3.1 Wood Units  PAPi                4    IMPRESSION: Mildly elevated pre and post capillary filling pressures Mildly elevated PA mean & PVR Moderate to severely reduced cardiac output & index  ASSESSMENT & PLAN:  NYHA Class I-II  nonischemic cardiomyopathy Etiology of HF: Nonischemic cardiomyopathy likely idiopathic dilated. However cannot rule out infiltrative disease. NYHA class / AHA Stage: NYHA I. Functionally has not limitations.   Volume status & Diuretics: Appears euvolemic. Continue taking torsemide  40 mg daily.  Vasodilators: Continue Entresto  97/103 mg bid.  Beta-Blocker: Contiue Coreg  to 3.125 mg bid.  MRA: Continue spironolactone  25 mg but move to at bedtime. Cardiometabolic: Continue Farxiga  10 mg daily. Devices therapies & Valvulopathies: Primary prevention ICD in place. Advanced therapies: Right heart catheterization and CPX consistent with stage D heart failure with severe heart failure limitations.  He has completed most of his LVAD evaluation and has been discussed at [facility].  - 11/06/22: EF remains severely reduced with now one hospitalization for HFrEF exacerbation.  Despite severely reduced EF he is asymptomatic Repeat Echo next visit.    2. CKD Stage IIIa - Continue farxiga  10 mg daily.  -I reviewed BMET 02/26/24, stable.   3. Aortic root aneurysm - 5 cm by echo in 10/23.  - Stable.   4. Paroxysmal atrial fibrillation/flutter - Regular on exam.  - Continue Eliquis  5 mg bid.   Follow up in 2 months with Dr Gardenia with an ECHO  Ria Gardenia NP-C  3:06 PM  04/19/24

## 2024-04-20 ENCOUNTER — Encounter (HOSPITAL_COMMUNITY): Admitting: Cardiology

## 2024-04-20 ENCOUNTER — Ambulatory Visit (HOSPITAL_COMMUNITY): Admission: RE | Admit: 2024-04-20 | Source: Ambulatory Visit

## 2024-04-20 NOTE — Progress Notes (Incomplete)
 Impression/Assessment:  Grade group 2 prostate cancer, high-volume, in the midst of EBRT and short-term ADT  Plan:  I will check his testosterone  level today  I will continue him on Orgovyx for 3 more months  History of Present Illness: James [name] is here for f/u of PCA.  8.27.2024: TRUS/Bx. PSA 7.5, prostate volume 51 mL, PSAD 0.15. 11/12 cores revealed adenocarcinoma.  9 cores--GG2 w/ PNI seen 2 cores--GGI pattern  He has been placed on ST ADT (Orgovyx) and completed EBRT--28 fractions completed 4.16.2025    Past Medical History:  Diagnosis Date   CKD (chronic kidney disease) stage 2, GFR 60-89 ml/min    COPD (chronic obstructive pulmonary disease) (HCC)    Diabetes mellitus type II, non insulin  dependent (HCC)    Dilated aortic root (HCC)    Elevated PSA    Hypercholesteremia    Hypertension    NICM (nonischemic cardiomyopathy) (HCC)    NSVT (nonsustained ventricular tachycardia) (HCC)    Obstructive sleep apnea    Paroxysmal atrial fibrillation (HCC)    Pulmonary hypertension (HCC)    PVC's (premature ventricular contractions)    Tobacco abuse     Past Surgical History:  Procedure Laterality Date   CARDIAC CATHETERIZATION  10/15/2000   COLONOSCOPY N/A 03/22/2014   Procedure: COLONOSCOPY;  Surgeon: Margo LITTIE Haddock, MD;  Location: [facility] ENDO SUITE;  Service: Endoscopy;  Laterality: N/A;  11:15 AM   ICD IMPLANT N/A 08/21/2021   Procedure: ICD IMPLANT;  Surgeon: Waddell Danelle ORN, MD;  Location: [facility] INVASIVE CV LAB;  Service: Cardiovascular;  Laterality: N/A;   INGUINAL HERNIA REPAIR Right 06/24/2017   Procedure: HERNIA REPAIR INGUINAL ADULT WITH MESH;  Surgeon: Mavis Anes, MD;  Location: [facility] ORS;  Service: General;  Laterality: Right;   PROSTATE BIOPSY     RIGHT HEART CATH N/A 11/13/2022   Procedure: RIGHT HEART CATH;  Surgeon: Gardenia Led, DO;  Location: [facility] INVASIVE CV LAB;  Service: Cardiovascular;  Laterality: N/A;   RIGHT HEART CATH N/A 12/21/2022   Procedure: RIGHT  HEART CATH;  Surgeon: Gardenia Led, DO;  Location: [facility] INVASIVE CV LAB;  Service: Cardiovascular;  Laterality: N/A;   RIGHT HEART CATH N/A 11/25/2023   Procedure: RIGHT HEART CATH;  Surgeon: Gardenia Led, DO;  Location: [facility] INVASIVE CV LAB;  Service: Cardiovascular;  Laterality: N/A;   RIGHT/LEFT HEART CATH AND CORONARY ANGIOGRAPHY N/A 07/19/2021   Procedure: RIGHT/LEFT HEART CATH AND CORONARY ANGIOGRAPHY;  Surgeon: Verlin Lonni BIRCH, MD;  Location: [facility] INVASIVE CV LAB;  Service: Cardiovascular;  Laterality: N/A;   TEE WITHOUT CARDIOVERSION N/A 12/21/2022   Procedure: TRANSESOPHAGEAL ECHOCARDIOGRAM (TEE);  Surgeon: Gardenia Led, DO;  Location: [facility] ENDOSCOPY;  Service: Cardiovascular;  Laterality: N/A;    Home Medications:  Allergies as of 04/21/2024   No Known Allergies      Medication List        Accurate as of April 20, 2024  6:51 AM. If you have any questions, ask your nurse or doctor.          acetaminophen  500 MG tablet Commonly known as: TYLENOL  Take 1,000 mg by mouth every 6 (six) hours as needed for moderate pain.   albuterol  108 (90 Base) MCG/ACT inhaler Commonly known as: VENTOLIN  HFA INHALE 2 PUFFS BY MOUTH FOUR TIMES DAILY AS NEEDED   apixaban  5 MG Tabs tablet Commonly known as: Eliquis  Take 1 tablet (5 mg total) by mouth 2 (two) times daily.   aspirin  EC 81 MG tablet Take 81 mg by mouth daily.  Swallow whole.   atorvastatin  40 MG tablet Commonly known as: LIPITOR Take 1 tablet (40 mg total) by mouth daily.   carvedilol  3.125 MG tablet Commonly known as: COREG  Take 1 tablet (3.125 mg total) by mouth 2 (two) times daily with a meal.   Entresto  97-103 MG Generic drug: sacubitril -valsartan  TAKE 1 TABLET BY MOUTH TWICE DAILY   Farxiga  10 MG Tabs tablet Generic drug: dapagliflozin  propanediol TAKE 1 TABLET(10 MG) BY MOUTH DAILY BEFORE BREAKFAST   meclizine  25 MG tablet Commonly known as: ANTIVERT  Take 1 tablet (25 mg total) by mouth 3  (three) times daily as needed for dizziness.   metFORMIN 500 MG tablet Commonly known as: GLUCOPHAGE TAKE 1 TABLET BY MOUTH TWICE DAILY   Orgovyx 120 MG tablet Generic drug: relugolix   spironolactone  25 MG tablet Commonly known as: ALDACTONE  Take 1 tablet (25 mg total) by mouth at bedtime.   torsemide  20 MG tablet Commonly known as: DEMADEX  TAKE 2 TABLETS(40 MG) BY MOUTH DAILY        Allergies: No Known Allergies  Family History  Problem Relation Age of Onset   Coronary artery disease Mother    Diabetes Father    Heart attack Father    Hypertension Father    Iron deficiency Father    Thyroid  disease Sister    Cardiomyopathy Brother        No significant coronary disease by coronary angiography   Heart disease Maternal Grandfather        Also paternal grandfather   Sudden death Other    Colon cancer Neg Hx     Social History:  reports that he quit smoking about 2 years ago. His smoking use included cigarettes. He started smoking about 41 years ago. He has a 19.4 pack-year smoking history. He has never used smokeless tobacco. He reports that he does not drink alcohol and does not use drugs.  ROS: A complete review of systems was performed.  All systems are negative except for pertinent findings as noted.  Physical Exam:  Vital signs in last 24 hours: There were no vitals taken for this visit. Constitutional:  Alert and oriented, No acute distress Cardiovascular: Regular rate  Respiratory: Normal respiratory effort GI: Abdomen is soft, nontender, nondistended, no abdominal masses. No CVAT.  Genitourinary: Normal male phallus, testes are descended bilaterally and non-tender and without masses, scrotum is normal in appearance without lesions or masses, perineum is normal on inspection. Lymphatic: No lymphadenopathy Neurologic: Grossly intact, no focal deficits Psychiatric: Normal mood and affect  I have reviewed prior pt notes   I have reviewed prior PSA  results

## 2024-04-21 ENCOUNTER — Ambulatory Visit: Admitting: Urology

## 2024-04-21 DIAGNOSIS — C61 Malignant neoplasm of prostate: Secondary | ICD-10-CM

## 2024-04-27 DIAGNOSIS — C61 Malignant neoplasm of prostate: Secondary | ICD-10-CM | POA: Diagnosis not present

## 2024-04-28 LAB — LAB REPORT - SCANNED: PSA, Total: 0.11

## 2024-04-30 ENCOUNTER — Encounter: Payer: Self-pay | Admitting: Urology

## 2024-05-01 DIAGNOSIS — D496 Neoplasm of unspecified behavior of brain: Secondary | ICD-10-CM | POA: Diagnosis not present

## 2024-05-01 DIAGNOSIS — Z6830 Body mass index (BMI) 30.0-30.9, adult: Secondary | ICD-10-CM | POA: Diagnosis not present

## 2024-05-04 ENCOUNTER — Other Ambulatory Visit: Payer: Self-pay | Admitting: Internal Medicine

## 2024-05-11 ENCOUNTER — Telehealth: Payer: Self-pay

## 2024-05-11 ENCOUNTER — Other Ambulatory Visit (HOSPITAL_COMMUNITY): Payer: Self-pay

## 2024-05-11 DIAGNOSIS — I5020 Unspecified systolic (congestive) heart failure: Secondary | ICD-10-CM

## 2024-05-11 NOTE — Telephone Encounter (Signed)
 Called and lvm for pt that [employer] has been attemtping to contact him in regards to his orgovyx advised pt off [employer] phone number [phone] or to call our office back to receive further details

## 2024-05-13 ENCOUNTER — Encounter (HOSPITAL_COMMUNITY)

## 2024-05-14 ENCOUNTER — Ambulatory Visit (HOSPITAL_BASED_OUTPATIENT_CLINIC_OR_DEPARTMENT_OTHER)
Admission: RE | Admit: 2024-05-14 | Discharge: 2024-05-14 | Disposition: A | Discharge status: Home / Self Care | Source: Ambulatory Visit | Attending: Adult Health | Admitting: Adult Health

## 2024-05-14 ENCOUNTER — Encounter (HOSPITAL_COMMUNITY): Payer: Self-pay

## 2024-05-14 ENCOUNTER — Ambulatory Visit (HOSPITAL_COMMUNITY)
Admission: RE | Admit: 2024-05-14 | Discharge: 2024-05-14 | Disposition: A | Discharge status: Home / Self Care | Source: Ambulatory Visit | Attending: Adult Health | Admitting: Adult Health

## 2024-05-14 ENCOUNTER — Ambulatory Visit (HOSPITAL_COMMUNITY): Payer: Self-pay | Admitting: Cardiology

## 2024-05-14 ENCOUNTER — Ambulatory Visit (HOSPITAL_COMMUNITY): Payer: Self-pay | Admitting: Internal Medicine

## 2024-05-14 VITALS — BP 110/80 | HR 84 | Ht 78.0 in | Wt 266.2 lb

## 2024-05-14 DIAGNOSIS — Z87891 Personal history of nicotine dependence: Secondary | ICD-10-CM | POA: Insufficient documentation

## 2024-05-14 DIAGNOSIS — I130 Hypertensive heart and chronic kidney disease with heart failure and stage 1 through stage 4 chronic kidney disease, or unspecified chronic kidney disease: Secondary | ICD-10-CM | POA: Insufficient documentation

## 2024-05-14 DIAGNOSIS — Z79899 Other long term (current) drug therapy: Secondary | ICD-10-CM | POA: Diagnosis not present

## 2024-05-14 DIAGNOSIS — G4730 Sleep apnea, unspecified: Secondary | ICD-10-CM | POA: Insufficient documentation

## 2024-05-14 DIAGNOSIS — Z923 Personal history of irradiation: Secondary | ICD-10-CM | POA: Diagnosis not present

## 2024-05-14 DIAGNOSIS — Q2543 Congenital aneurysm of aorta: Secondary | ICD-10-CM | POA: Diagnosis not present

## 2024-05-14 DIAGNOSIS — Z8546 Personal history of malignant neoplasm of prostate: Secondary | ICD-10-CM | POA: Insufficient documentation

## 2024-05-14 DIAGNOSIS — Z7901 Long term (current) use of anticoagulants: Secondary | ICD-10-CM | POA: Insufficient documentation

## 2024-05-14 DIAGNOSIS — I5020 Unspecified systolic (congestive) heart failure: Secondary | ICD-10-CM

## 2024-05-14 DIAGNOSIS — I493 Ventricular premature depolarization: Secondary | ICD-10-CM | POA: Diagnosis not present

## 2024-05-14 DIAGNOSIS — Z7984 Long term (current) use of oral hypoglycemic drugs: Secondary | ICD-10-CM | POA: Diagnosis not present

## 2024-05-14 DIAGNOSIS — I428 Other cardiomyopathies: Secondary | ICD-10-CM | POA: Diagnosis not present

## 2024-05-14 DIAGNOSIS — N1831 Chronic kidney disease, stage 3a: Secondary | ICD-10-CM | POA: Diagnosis not present

## 2024-05-14 DIAGNOSIS — Z95810 Presence of automatic (implantable) cardiac defibrillator: Secondary | ICD-10-CM | POA: Diagnosis not present

## 2024-05-14 DIAGNOSIS — I444 Left anterior fascicular block: Secondary | ICD-10-CM | POA: Insufficient documentation

## 2024-05-14 DIAGNOSIS — I4892 Unspecified atrial flutter: Secondary | ICD-10-CM | POA: Diagnosis not present

## 2024-05-14 DIAGNOSIS — I340 Nonrheumatic mitral (valve) insufficiency: Secondary | ICD-10-CM | POA: Insufficient documentation

## 2024-05-14 DIAGNOSIS — E1122 Type 2 diabetes mellitus with diabetic chronic kidney disease: Secondary | ICD-10-CM | POA: Diagnosis not present

## 2024-05-14 DIAGNOSIS — E785 Hyperlipidemia, unspecified: Secondary | ICD-10-CM | POA: Insufficient documentation

## 2024-05-14 DIAGNOSIS — I480 Paroxysmal atrial fibrillation: Secondary | ICD-10-CM | POA: Insufficient documentation

## 2024-05-14 LAB — BASIC METABOLIC PANEL WITH GFR
Anion gap: 10 (ref 5–15)
BUN: 31 mg/dL — ABNORMAL HIGH (ref 8–23)
CO2: 27 mmol/L (ref 22–32)
Calcium: 8.9 mg/dL (ref 8.9–10.3)
Chloride: 101 mmol/L (ref 98–111)
Creatinine, Ser: 1.54 mg/dL — ABNORMAL HIGH (ref 0.61–1.24)
GFR, Estimated: 50 mL/min — ABNORMAL LOW (ref >60)
Glucose, Bld: 129 mg/dL — ABNORMAL HIGH (ref 70–99)
Potassium: 4.3 mmol/L (ref 3.5–5.1)
Sodium: 138 mmol/L (ref 135–145)

## 2024-05-14 LAB — CBC
HCT: 39.3 % (ref 39.0–52.0)
Hemoglobin: 13.4 g/dL (ref 13.0–17.0)
MCH: 32.1 pg (ref 26.0–34.0)
MCHC: 34.1 g/dL (ref 30.0–36.0)
MCV: 94 fL (ref 80.0–100.0)
Platelets: 182 K/uL (ref 150–400)
RBC: 4.18 MIL/uL — ABNORMAL LOW (ref 4.22–5.81)
RDW: 12.9 % (ref 11.5–15.5)
WBC: 7.4 K/uL (ref 4.0–10.5)
nRBC: 0 % (ref 0.0–0.2)

## 2024-05-14 LAB — ECHOCARDIOGRAM COMPLETE: S' Lateral: 6.7 cm

## 2024-05-14 LAB — MAGNESIUM: Magnesium: 2.1 mg/dL (ref 1.7–2.4)

## 2024-05-14 NOTE — Progress Notes (Signed)
 ADVANCED HEART FAILURE CLINIC NOTE  Primary Care: Bevely Doffing, FNP Primary Cardiologist: Maude Emmer, MD HF Cardiologist: Ria Commander, MD  Reason for visit: F/u for chronic systolic heart failure   HPI: James [name] is a 63 y.o. male with nonischemic cardiomyopathy s/p ICD, HTN, T2DM, aortic root aneurysm. Mr. Gomes HF dates back to 2011 when he was diagnosed with nonischemic cardiomyopathy.  According to Mr. Paino he came to the [facility] system in 2013 when he had a left heart cath with no coronary disease with a EF of 20%, he was started on low-dose GDMT at that time.  By 2013 his EF improved to 45%.  He followed yearly with Dr. Nishan until 2022 when he once again had a reduction in LVEF to 20 to 25%. He was then started on Entresto . Repeat heart cath demonstrated no significant coronary disease.    Since establishing care with us , Mr. Rhinesmith has undergone evaluation for advanced therapies.  His CPX is suggestive of severe heart failure limitation and multiple right heart catheterizations demonstrate persistently reduced cardiac index less than 2 L/min/m.  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  Completed radiation for prostate cancer April 2025. He was also referred to Neurology for mass noted on CT of head. He reports he was seen by neurology last wk and was told that CT finding was felt to be benign (I am unable to locate note from specailist)  Today he returns for HF follow. Repeat echo also done today, interprtation pending.   He reports no changes in symptoms. Still getting around well functionally w/o limiation. 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 NYHA Class I. No chest pain. No LEE, orthopnea/PND. Reports full med compliance. BP well controlled 110/80. No orthostatic symptoms.  EKG shows NSR w/ PVCs. Asymptomatic. Unable to interrogate ICD ([employer]).  He denies ICD shocks. No syncope/near syncope.      Past Medical History:  Diagnosis Date   CKD (chronic kidney disease) stage 2, GFR 60-89 ml/min    COPD (chronic obstructive pulmonary disease) (HCC)    Diabetes mellitus type II, non insulin  dependent (HCC)    Dilated aortic root (HCC)    Elevated PSA    Hypercholesteremia    Hypertension    NICM (nonischemic cardiomyopathy) (HCC)    NSVT (nonsustained ventricular tachycardia) (HCC)    Obstructive sleep apnea    Paroxysmal atrial fibrillation (HCC)    Pulmonary hypertension (HCC)    PVC's (premature ventricular contractions)    Tobacco abuse    Current Outpatient Medications  Medication Sig Dispense Refill   apixaban  (ELIQUIS ) 5 MG TABS tablet Take 1 tablet (5 mg total) by mouth 2 (two) times daily. 60 tablet 11   aspirin  EC 81 MG tablet Take 81 mg by mouth daily. Swallow whole.     atorvastatin  (LIPITOR) 40 MG tablet Take 1 tablet (40 mg total) by mouth daily. 90 tablet 3   carvedilol  (COREG ) 3.125 MG tablet Take 1 tablet (3.125 mg total) by mouth 2 (two) times daily with a meal. 60 tablet 5   ENTRESTO  97-103 MG TAKE 1 TABLET BY MOUTH TWICE DAILY 60 tablet 11   FARXIGA  10 MG TABS tablet TAKE 1 TABLET(10 MG) BY MOUTH DAILY BEFORE BREAKFAST 90 tablet 3   metFORMIN (GLUCOPHAGE) 500 MG tablet TAKE 1 TABLET BY MOUTH TWICE DAILY 180 tablet 1   ORGOVYX 120  MG tablet      spironolactone  (ALDACTONE ) 25 MG tablet Take 1 tablet (25 mg total) by mouth at bedtime. 90 tablet 3   torsemide  (DEMADEX ) 20 MG tablet TAKE 2 TABLETS(40 MG) BY MOUTH DAILY 60 tablet 2   acetaminophen  (TYLENOL ) 500 MG tablet Take 1,000 mg by mouth every 6 (six) hours as needed for moderate pain.     albuterol  (VENTOLIN  HFA) 108 (90 Base) MCG/ACT inhaler INHALE 2 PUFFS BY MOUTH FOUR TIMES DAILY AS NEEDED 6.7 g 1   meclizine  (ANTIVERT ) 25 MG tablet Take 1 tablet (25 mg total) by mouth 3 (three) times daily as needed for dizziness. (Patient not taking: Reported on  05/14/2024) 30 tablet 0   No current facility-administered medications for this encounter.   Facility-Administered Medications Ordered in Other Encounters  Medication Dose Route Frequency Provider Last Rate Last Admin   sodium chloride  flush (NS) 0.9 % injection 3 mL  3 mL Intravenous Q12H Sabharwal, Aditya, DO        BP 110/80   Pulse 84   Ht 6' 6 (1.981 m)   Wt 120.7 kg (266 lb 3.2 oz)   SpO2 98%   BMI 30.76 kg/m  Wt Readings from Last 3 Encounters:  05/14/24 120.7 kg (266 lb 3.2 oz)  03/24/24 118.9 kg (262 lb 3.2 oz)  02/28/24 119.6 kg (263 lb 9.6 oz)    PHYSICAL EXAM: Vitals:   05/14/24 1456  BP: 110/80  Pulse: 84  SpO2: 98%   GENERAL: NAD Lungs- clear  CARDIAC:  JVP: not elevated         Normal rate with regular rhythm. No murmurs.  Pulses 2+ bilaterally. No LE edema.  ABDOMEN: Soft, non-tender, non-distended. EXTREMITIES: Warm and well perfused.  NEUROLOGIC: No obvious FND  ECHO: 07/23/2022: LVEF 20 to 25%, severely dilated, RV function moderately reduced, LA severely dilated, mild MR. 10/22/22: LVEF 25-30% 12/21/22 (TEE): LVEF 20-25%, RV function mildly reduced. [facility]neurysmal aortic root at [facility].  10/11/23: LVEF 20%, moderately reduced RV function  05/14/24 (  CATH: RHC/LHC (07/19/21): No angiographic evidence of CAD Elevated right and left heart pressures (LV: 130/22/39, RA 15, RV 82/13/20, PA 83/30 mean 49, PCWP 35).   RHC: 12/21/22: HEMODYNAMICS: RA:                  8 mmHg (mean) RV:                  83/7 mmHg PA:                  79/34 mmHg (51 mean) PCWP:            25 mmHg (mean)                                      Estimated Fick CO/CI   3.3 L/min, 1.3 L/min/m2         Thermodilution CO/CI   4.9 L/min, 1.9 L/min/m2                                     TPG                 26  mmHg  PVR                 5.3-8.8 Wood Units  PAPi                5.6    RHC 11/2023  RA:                  6 mmHg (mean) RV:                   44/6 mmHg PA:                  43/19 mmHg (30 mean) PCWP:            17 mmHg (mean)                                     Estimated Fick CO/CI   4.2 L/min, 1.7 L/min/m2 Thermodilution CO/CI   5.34 L/min, 2.2 L/min/m2                                           TPG                 13  mmHg                                            PVR                 2.5-3.1 Wood Units  PAPi                4    IMPRESSION: Mildly elevated pre and post capillary filling pressures Mildly elevated PA mean & PVR Moderate to severely reduced cardiac output & index  ASSESSMENT & PLAN:  NYHA Class I-II nonischemic cardiomyopathy Etiology of HF: Nonischemic cardiomyopathy likely idiopathic dilated. However cannot rule out infiltrative disease. NYHA class / AHA Stage: NYHA I. Functionally has not limitations.   Volume status & Diuretics:  Euvolemic on exam. Continue taking torsemide  40 mg daily. Check BMP today  Vasodilators: Continue Entresto  97/103 mg bid.  Beta-Blocker: Contiue Coreg  to 3.125 mg bid.  MRA: Continue Spironolactone  25 mg daily  Cardiometabolic: Continue Farxiga  10 mg daily. Devices therapies & Valvulopathies: Primary prevention ICD in place. Advanced therapies: Right heart catheterization and CPX consistent with stage D heart failure with severe heart failure limitations.  He has completed most of his LVAD evaluation and has been discussed at [facility]. He says he would prefer heart transplant over VAD.  - 11/06/22: EF remains severely reduced with now one hospitalization for HFrEF exacerbation.  - echo repeated today, interpretation pending  - Despite severely reduced EF, he remains asymptomatic. Will continue to follow closely   2. CKD Stage IIIa - Continue farxiga  10 mg daily.  - check BMP today   3. Aortic root aneurysm - 5 cm by echo in 10/23.  - echo repeated today, pending   4. Paroxysmal atrial fibrillation/flutter - EKG today shows NSR  - Continue Eliquis  5 mg bid. Denies bleeding.  Check CBC today   Advised to notify us  promptly if any change in baseline functional status. F/u w/ Dr. Gardenia in 6-8 wks.     Samai Corea  Danaye Sobh PA-C   3:22 PM 05/14/24

## 2024-05-14 NOTE — Patient Instructions (Addendum)
 Labs done today, your results will be available in MyChart, we will contact you for abnormal readings.  Follow-Up in: 2 months as scheduled with Dr. Gardenia   At the [facility], you and your health needs are our priority. We have a designated team specialized in the treatment of Heart Failure. This Care Team includes your primary Heart Failure Specialized Cardiologist (physician), Advanced Practice Providers (APPs- Physician Assistants and Nurse Practitioners), and Pharmacist who all work together to provide you with the care you need, when you need it.   You may see any of the following providers on your designated Care Team at your next follow up:  Dr. Toribio Fuel Dr. Ezra Shuck Dr. Ria Gardenia Dr. Odis Brownie Greig Mosses, NP Caffie Shed, [place] [facility], [place] Beckey Coe, NP [name], NP Tinnie Redman, PharmD   Please be sure to bring in all your medications bottles to every appointment.   Need to Contact Us :  If you have any questions or concerns before your next appointment please send us  a message through [place] or call our office at [phone].    TO LEAVE A MESSAGE FOR THE NURSE SELECT OPTION 2, PLEASE LEAVE A MESSAGE INCLUDING: YOUR NAME DATE OF BIRTH CALL BACK NUMBER REASON FOR CALL**this is important as we prioritize the call backs  YOU WILL RECEIVE A CALL BACK THE SAME DAY AS LONG AS YOU CALL BEFORE 4:00 PM

## 2024-05-14 NOTE — Progress Notes (Signed)
  Echocardiogram 2D Echocardiogram has been performed.  Koleen KANDICE Popper, RDCS 05/14/2024, 2:56 PM

## 2024-05-15 ENCOUNTER — Other Ambulatory Visit: Payer: Self-pay | Admitting: Internal Medicine

## 2024-05-15 DIAGNOSIS — I5020 Unspecified systolic (congestive) heart failure: Secondary | ICD-10-CM

## 2024-05-18 ENCOUNTER — Ambulatory Visit: Payer: BC Managed Care – PPO

## 2024-05-18 DIAGNOSIS — I428 Other cardiomyopathies: Secondary | ICD-10-CM | POA: Diagnosis not present

## 2024-05-18 LAB — CUP PACEART REMOTE DEVICE CHECK
Date Time Interrogation Session: 20250804130145
Implantable Lead Connection Status: 753985
Implantable Lead Implant Date: 20221107
Implantable Lead Location: 753860
Implantable Lead Model: 436909
Implantable Lead Serial Number: 81476621
Implantable Pulse Generator Implant Date: 20221107
Pulse Gen Model: 429525
Pulse Gen Serial Number: 84864435

## 2024-05-19 ENCOUNTER — Ambulatory Visit: Payer: Self-pay | Admitting: Internal Medicine

## 2024-05-26 ENCOUNTER — Telehealth (HOSPITAL_COMMUNITY): Payer: Self-pay | Admitting: Cardiology

## 2024-05-26 DIAGNOSIS — I5020 Unspecified systolic (congestive) heart failure: Secondary | ICD-10-CM

## 2024-05-26 MED ORDER — TORSEMIDE 20 MG PO TABS: 60.0000 mg | ORAL_TABLET | Freq: Every day | ORAL | 2 refills | Status: DC

## 2024-05-26 NOTE — Telephone Encounter (Signed)
 PT AWARE VIA WIFE AND VOICED UNDERSTANDING

## 2024-05-26 NOTE — Telephone Encounter (Signed)
 PT AWARE OF ECHO RESULTS VIA WIFE

## 2024-06-03 ENCOUNTER — Ambulatory Visit (HOSPITAL_COMMUNITY): Payer: Self-pay | Admitting: Cardiology

## 2024-06-03 ENCOUNTER — Ambulatory Visit (HOSPITAL_COMMUNITY)
Admission: RE | Admit: 2024-06-03 | Discharge: 2024-06-03 | Disposition: A | Discharge status: Home / Self Care | Source: Ambulatory Visit | Attending: Cardiology | Admitting: Cardiology

## 2024-06-03 DIAGNOSIS — I5020 Unspecified systolic (congestive) heart failure: Secondary | ICD-10-CM | POA: Insufficient documentation

## 2024-06-03 LAB — BASIC METABOLIC PANEL WITH GFR
Anion gap: 11 (ref 5–15)
BUN: 22 mg/dL (ref 8–23)
CO2: 28 mmol/L (ref 22–32)
Calcium: 9.2 mg/dL (ref 8.9–10.3)
Chloride: 99 mmol/L (ref 98–111)
Creatinine, Ser: 1.32 mg/dL — ABNORMAL HIGH (ref 0.61–1.24)
GFR, Estimated: >60 mL/min (ref >60)
Glucose, Bld: 128 mg/dL — ABNORMAL HIGH (ref 70–99)
Potassium: 4.7 mmol/L (ref 3.5–5.1)
Sodium: 138 mmol/L (ref 135–145)

## 2024-06-03 LAB — BRAIN NATRIURETIC PEPTIDE: B Natriuretic Peptide: 795.1 pg/mL — ABNORMAL HIGH (ref 0.0–100.0)

## 2024-06-09 ENCOUNTER — Telehealth (HOSPITAL_COMMUNITY): Payer: Self-pay

## 2024-06-09 NOTE — Telephone Encounter (Signed)
 Called to confirm/remind patient of their appointment at the [facility] on 06/10/24.   Appointment:   [] Confirmed  [] Left mess   [x] No answer/No voice mail  [] VM Full/unable to leave message  [] Phone not in service

## 2024-06-09 NOTE — Progress Notes (Signed)
 ADVANCED HEART FAILURE CLINIC NOTE  Primary Care: Bevely Doffing, FNP Primary Cardiologist: Maude Emmer, MD HF Cardiologist: Ria Commander, MD  Reason for visit: F/u for chronic systolic heart failure   HPI: James [name] is a 63 y.o. male with nonischemic cardiomyopathy s/p ICD, HTN, T2DM, aortic root aneurysm. James [name] HF dates back to 2011 when he was diagnosed with nonischemic cardiomyopathy.  According to James [name] he came to the [facility] system in 2013 when he had a left heart cath with no coronary disease with a EF of 20%, he was started on low-dose GDMT at that time.  By 2013 his EF improved to 45%.  He followed yearly with Dr. Nishan until 2022 when he once again had a reduction in LVEF to 20 to 25%. He was then started on Entresto . Repeat heart cath demonstrated no significant coronary disease.    Since establishing care with us , James [name] has undergone evaluation for advanced therapies.  His CPX is suggestive of severe heart failure limitation and multiple right heart catheterizations demonstrate persistently reduced cardiac index less than 2 L/min/m.  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  Completed radiation for prostate cancer April 2025. He was also referred to Neurology for mass noted on CT of head. He reports he was seen by neurology last wk and was told that CT finding was felt to be benign (I am unable to locate note from specailist)  Today he returns for HF follow. Repeat echo also done today, interprtation pending.   He reports no changes in symptoms. Still getting around well functionally w/o limiation. 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 NYHA Class I. No chest pain. No LEE, orthopnea/PND. Reports full med compliance. BP well controlled 110/80. No orthostatic symptoms.  EKG shows NSR w/ PVCs. Asymptomatic. Unable to interrogate ICD ([employer]).  He denies ICD shocks. No syncope/near syncope.      Past Medical History:  Diagnosis Date   CKD (chronic kidney disease) stage 2, GFR 60-89 ml/min    COPD (chronic obstructive pulmonary disease) (HCC)    Diabetes mellitus type II, non insulin  dependent (HCC)    Dilated aortic root (HCC)    Elevated PSA    Hypercholesteremia    Hypertension    NICM (nonischemic cardiomyopathy) (HCC)    NSVT (nonsustained ventricular tachycardia) (HCC)    Obstructive sleep apnea    Paroxysmal atrial fibrillation (HCC)    Pulmonary hypertension (HCC)    PVC's (premature ventricular contractions)    Tobacco abuse    Current Outpatient Medications  Medication Sig Dispense Refill   acetaminophen  (TYLENOL ) 500 MG tablet Take 1,000 mg by mouth every 6 (six) hours as needed for moderate pain.     albuterol  (VENTOLIN  HFA) 108 (90 Base) MCG/ACT inhaler INHALE 2 PUFFS BY MOUTH FOUR TIMES DAILY AS NEEDED 6.7 g 1   apixaban  (ELIQUIS ) 5 MG TABS tablet Take 1 tablet (5 mg total) by mouth 2 (two) times daily. 60 tablet 11   aspirin  EC 81 MG tablet Take 81 mg by mouth daily. Swallow whole.     atorvastatin  (LIPITOR) 40 MG tablet Take 1 tablet (40 mg total) by mouth daily. 90 tablet 3   carvedilol  (COREG ) 3.125 MG tablet Take 1 tablet (3.125 mg total) by mouth 2 (two) times daily with a meal. 60 tablet 5   ENTRESTO  97-103 MG TAKE 1 TABLET BY  MOUTH TWICE DAILY 60 tablet 11   FARXIGA  10 MG TABS tablet TAKE 1 TABLET(10 MG) BY MOUTH DAILY BEFORE BREAKFAST 90 tablet 3   meclizine  (ANTIVERT ) 25 MG tablet Take 1 tablet (25 mg total) by mouth 3 (three) times daily as needed for dizziness. (Patient not taking: Reported on 05/14/2024) 30 tablet 0   metFORMIN (GLUCOPHAGE) 500 MG tablet TAKE 1 TABLET BY MOUTH TWICE DAILY 180 tablet 1   ORGOVYX 120 MG tablet      spironolactone  (ALDACTONE ) 25 MG tablet Take 1 tablet (25 mg total) by mouth at bedtime. 90 tablet 3   torsemide  (DEMADEX ) 20 MG tablet Take 3 tablets (60 mg total) by  mouth daily. 90 tablet 2   No current facility-administered medications for this visit.   Facility-Administered Medications Ordered in Other Visits  Medication Dose Route Frequency Provider Last Rate Last Admin   sodium chloride  flush (NS) 0.9 % injection 3 mL  3 mL Intravenous Q12H Sabharwal, Aditya, DO        There were no vitals taken for this visit. Wt Readings from Last 3 Encounters:  05/14/24 120.7 kg (266 lb 3.2 oz)  03/24/24 118.9 kg (262 lb 3.2 oz)  02/28/24 119.6 kg (263 lb 9.6 oz)    PHYSICAL EXAM: There were no vitals filed for this visit.  GENERAL: NAD Lungs- clear  CARDIAC:  JVP: not elevated         Normal rate with regular rhythm. No murmurs.  Pulses 2+ bilaterally. No LE edema.  ABDOMEN: Soft, non-tender, non-distended. EXTREMITIES: Warm and well perfused.  NEUROLOGIC: No obvious FND  ECHO: 07/23/2022: LVEF 20 to 25%, severely dilated, RV function moderately reduced, LA severely dilated, mild MR. 10/22/22: LVEF 25-30% 12/21/22 (TEE): LVEF 20-25%, RV function mildly reduced. Aneurysmal aortic root at [facility].  10/11/23: LVEF 20%, moderately reduced RV function  05/14/24 (  CATH: RHC/LHC (07/19/21): No angiographic evidence of CAD Elevated right and left heart pressures (LV: 130/22/39, RA 15, RV 82/13/20, PA 83/30 mean 49, PCWP 35).   RHC: 12/21/22: HEMODYNAMICS: RA:                  8 mmHg (mean) RV:                  83/7 mmHg PA:                  79/34 mmHg (51 mean) PCWP:            25 mmHg (mean)                                      Estimated Fick CO/CI   3.3 L/min, 1.3 L/min/m2         Thermodilution CO/CI   4.9 L/min, 1.9 L/min/m2                                     TPG                 26  mmHg                                            PVR  5.3-8.8 Wood Units  PAPi                5.6    RHC 11/2023  RA:                  6 mmHg (mean) RV:                  44/6 mmHg PA:                  43/19 mmHg (30 mean) PCWP:            17 mmHg (mean)                                      Estimated Fick CO/CI   4.2 L/min, 1.7 L/min/m2 Thermodilution CO/CI   5.34 L/min, 2.2 L/min/m2                                           TPG                 13  mmHg                                            PVR                 2.5-3.1 Wood Units  PAPi                4    IMPRESSION: Mildly elevated pre and post capillary filling pressures Mildly elevated PA mean & PVR Moderate to severely reduced cardiac output & index  ASSESSMENT & PLAN:  NYHA Class I-II nonischemic cardiomyopathy Etiology of HF: Nonischemic cardiomyopathy likely idiopathic dilated. However cannot rule out infiltrative disease. NYHA class / AHA Stage: NYHA I. Functionally has not limitations.   Volume status & Diuretics:  Euvolemic on exam. Continue taking torsemide  40 mg daily. Check BMP today  Vasodilators: Continue Entresto  97/103 mg bid.  Beta-Blocker: Contiue Coreg  to 3.125 mg bid.  MRA: Continue Spironolactone  25 mg daily  Cardiometabolic: Continue Farxiga  10 mg daily. Devices therapies & Valvulopathies: Primary prevention ICD in place. Advanced therapies: Right heart catheterization and CPX consistent with stage D heart failure with severe heart failure limitations.  He has completed most of his LVAD evaluation and has been discussed at [facility]. He says he would prefer heart transplant over VAD.  - 11/06/22: EF remains severely reduced with now one hospitalization for HFrEF exacerbation.  - echo repeated today, interpretation pending  - Despite severely reduced EF, he remains asymptomatic. Will continue to follow closely   2. CKD Stage IIIa - Continue farxiga  10 mg daily.  - check BMP today   3. Aortic root aneurysm - 5 cm by echo in 10/23.  - echo repeated today, pending   4. Paroxysmal atrial fibrillation/flutter - EKG today shows NSR  - Continue Eliquis  5 mg bid. Denies bleeding. Check CBC today   Advised to notify us  promptly if any change in baseline functional  status. F/u w/ Dr. Gardenia in 6-8 wks.     James HERO Viliami Bracco PA-C   5:10 PM 06/09/24

## 2024-06-10 ENCOUNTER — Ambulatory Visit (HOSPITAL_COMMUNITY): Payer: Self-pay | Admitting: Family Medicine

## 2024-06-10 ENCOUNTER — Ambulatory Visit (HOSPITAL_COMMUNITY)
Admission: RE | Admit: 2024-06-10 | Discharge: 2024-06-10 | Disposition: A | Discharge status: Home / Self Care | Source: Ambulatory Visit | Attending: Family Medicine | Admitting: Family Medicine

## 2024-06-10 ENCOUNTER — Encounter (HOSPITAL_COMMUNITY): Payer: Self-pay

## 2024-06-10 VITALS — BP 96/62 | HR 85 | Ht 78.0 in | Wt 265.8 lb

## 2024-06-10 DIAGNOSIS — Q2543 Congenital aneurysm of aorta: Secondary | ICD-10-CM | POA: Diagnosis not present

## 2024-06-10 DIAGNOSIS — N1831 Chronic kidney disease, stage 3a: Secondary | ICD-10-CM | POA: Diagnosis not present

## 2024-06-10 DIAGNOSIS — I4892 Unspecified atrial flutter: Secondary | ICD-10-CM | POA: Insufficient documentation

## 2024-06-10 DIAGNOSIS — I5022 Chronic systolic (congestive) heart failure: Secondary | ICD-10-CM | POA: Diagnosis not present

## 2024-06-10 DIAGNOSIS — I129 Hypertensive chronic kidney disease with stage 1 through stage 4 chronic kidney disease, or unspecified chronic kidney disease: Secondary | ICD-10-CM | POA: Insufficient documentation

## 2024-06-10 DIAGNOSIS — Z7984 Long term (current) use of oral hypoglycemic drugs: Secondary | ICD-10-CM | POA: Diagnosis not present

## 2024-06-10 DIAGNOSIS — Z95810 Presence of automatic (implantable) cardiac defibrillator: Secondary | ICD-10-CM | POA: Diagnosis not present

## 2024-06-10 DIAGNOSIS — Z87891 Personal history of nicotine dependence: Secondary | ICD-10-CM | POA: Insufficient documentation

## 2024-06-10 DIAGNOSIS — M79671 Pain in right foot: Secondary | ICD-10-CM | POA: Diagnosis not present

## 2024-06-10 DIAGNOSIS — E1122 Type 2 diabetes mellitus with diabetic chronic kidney disease: Secondary | ICD-10-CM | POA: Insufficient documentation

## 2024-06-10 DIAGNOSIS — Z7901 Long term (current) use of anticoagulants: Secondary | ICD-10-CM | POA: Diagnosis not present

## 2024-06-10 DIAGNOSIS — I739 Peripheral vascular disease, unspecified: Secondary | ICD-10-CM | POA: Diagnosis not present

## 2024-06-10 DIAGNOSIS — Z79899 Other long term (current) drug therapy: Secondary | ICD-10-CM | POA: Insufficient documentation

## 2024-06-10 DIAGNOSIS — I428 Other cardiomyopathies: Secondary | ICD-10-CM | POA: Insufficient documentation

## 2024-06-10 DIAGNOSIS — E1140 Type 2 diabetes mellitus with diabetic neuropathy, unspecified: Secondary | ICD-10-CM | POA: Diagnosis not present

## 2024-06-10 DIAGNOSIS — I480 Paroxysmal atrial fibrillation: Secondary | ICD-10-CM | POA: Diagnosis not present

## 2024-06-10 DIAGNOSIS — L110 Acquired keratosis follicularis: Secondary | ICD-10-CM | POA: Diagnosis not present

## 2024-06-10 LAB — BASIC METABOLIC PANEL WITH GFR
Anion gap: 11 (ref 5–15)
BUN: 20 mg/dL (ref 8–23)
CO2: 27 mmol/L (ref 22–32)
Calcium: 9 mg/dL (ref 8.9–10.3)
Chloride: 96 mmol/L — ABNORMAL LOW (ref 98–111)
Creatinine, Ser: 1.31 mg/dL — ABNORMAL HIGH (ref 0.61–1.24)
GFR, Estimated: >60 mL/min (ref >60)
Glucose, Bld: 87 mg/dL (ref 70–99)
Potassium: 3.8 mmol/L (ref 3.5–5.1)
Sodium: 134 mmol/L — ABNORMAL LOW (ref 135–145)

## 2024-06-10 LAB — BRAIN NATRIURETIC PEPTIDE: B Natriuretic Peptide: 1071.5 pg/mL — ABNORMAL HIGH (ref 0.0–100.0)

## 2024-06-10 NOTE — Patient Instructions (Signed)
 No change in medications. Labs today - will call you if abnormal. Return to scheduled appointment with Dr. Gardenia - see below. Please call us  if any questions or concerns prior to your next visit.

## 2024-06-10 NOTE — Addendum Note (Signed)
 Encounter addended by: Elinda Rolin SAILOR, [place] on: 06/10/2024 2:48 PM  Actions taken: Clinical Note Signed, Order list changed, Diagnosis association updated, Charge Capture section accepted

## 2024-06-10 NOTE — Progress Notes (Signed)
 ReDS Vest / Clip - 06/10/24 1200       ReDS Vest / Clip   Station Marker D    Ruler Value 36    ReDS Value Range Low volume    ReDS Actual Value 33

## 2024-06-22 ENCOUNTER — Telehealth (HOSPITAL_COMMUNITY): Payer: Self-pay | Admitting: Cardiology

## 2024-06-24 ENCOUNTER — Ambulatory Visit

## 2024-06-24 VITALS — BP 103/70 | HR 70 | Ht 78.0 in | Wt 270.1 lb

## 2024-06-24 DIAGNOSIS — E785 Hyperlipidemia, unspecified: Secondary | ICD-10-CM | POA: Diagnosis not present

## 2024-06-24 DIAGNOSIS — E119 Type 2 diabetes mellitus without complications: Secondary | ICD-10-CM

## 2024-06-24 DIAGNOSIS — I5043 Acute on chronic combined systolic (congestive) and diastolic (congestive) heart failure: Secondary | ICD-10-CM | POA: Diagnosis not present

## 2024-06-24 NOTE — Progress Notes (Signed)
 Established Patient Office Visit  Subjective   Patient ID: James [name], male    DOB: 1961/05/17  Age: 63 y.o. MRN: [id number]  Chief Complaint  Patient presents with   Medical Management of Chronic Issues    3 month follow up   HPI  Patient Active Problem List   Diagnosis Date Noted   Mass of brain 03/24/2024   Vitamin D  deficiency 12/02/2023   Malignant neoplasm of prostate (HCC) 11/17/2023   Paroxysmal atrial fibrillation (HCC) 10/09/2023   Bilateral lower extremity edema 09/25/2023   Vertigo 08/29/2023   Elevated PSA 06/13/2023   Abnormal echocardiogram 08/24/2022   Stage 3a chronic kidney disease (HCC) 08/24/2022   HFrEF (heart failure with reduced ejection fraction) (HCC) 08/23/2022   Asthma 07/30/2022   Encounter to establish care 07/30/2022   Acute exacerbation of CHF (congestive heart failure) (HCC) 07/22/2022   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   NSVT (nonsustained ventricular tachycardia) 07/21/2021   Pulmonary hypertension, unspecified (HCC) 07/21/2021   Tobacco abuse 07/21/2021   Diabetes mellitus type 2 in nonobese (HCC) 07/21/2021   Acute on chronic combined systolic and diastolic CHF (congestive heart failure) (HCC)    Non-recurrent unilateral inguinal hernia without obstruction or gangrene    Perforation bowel (HCC) 06/19/2014   S/P colonoscopy with polypectomy 06/18/2014   Hyperlipidemia 11/04/2012   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   Cholelithiasis    Obstructive sleep apnea 04/28/2012   Cardiomyopathy, nonischemic (HCC) 04/03/2012   Essential hypertension 08/07/2010   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/07/2010    ROS    Objective:     BP 103/70   Pulse 70   Ht 6' 6 (1.981 m)   Wt 270 lb 1.9 oz (122.5 kg)   SpO2 95%   BMI 31.22 kg/m  BP Readings from Last 3 Encounters:  06/24/24 103/70  06/10/24 96/62  05/14/24 110/80   Wt Readings from Last 3 Encounters:  06/24/24 270 lb 1.9 oz (122.5 kg)  06/10/24 265 lb 12.8 oz  (120.6 kg)  05/14/24 266 lb 3.2 oz (120.7 kg)    Physical Exam Vitals and nursing note reviewed.  Constitutional:      Appearance: Normal appearance. He is obese.  HENT:     Head: Normocephalic.  Eyes:     Extraocular Movements: Extraocular movements intact.     Pupils: Pupils are equal, round, and reactive to light.  Cardiovascular:     Rate and Rhythm: Normal rate and regular rhythm.  Pulmonary:     Effort: Pulmonary effort is normal.     Breath sounds: Normal breath sounds.  Musculoskeletal:     Cervical back: Normal range of motion and neck supple.     Right lower leg: No edema.     Left lower leg: No edema.     Comments: Wearing compression socks  Neurological:     Mental Status: He is alert and oriented to person, place, and time.  Psychiatric:        Mood and Affect: Mood normal.        Thought Content: Thought content normal.    No results found for any visits on 06/24/24.  Last CBC Lab Results  Component Value Date   WBC 7.4 05/14/2024   HGB 13.4 05/14/2024   HCT 39.3 05/14/2024   MCV 94.0 05/14/2024   MCH 32.1 05/14/2024   RDW 12.9 05/14/2024   PLT 182 05/14/2024   Last metabolic panel Lab Results  Component Value Date   GLUCOSE 87 06/10/2024  NA 134 (L) 06/10/2024   K 3.8 06/10/2024   CL 96 (L) 06/10/2024   CO2 27 06/10/2024   BUN 20 06/10/2024   CREATININE 1.31 (H) 06/10/2024   GFRNONAA >60 06/10/2024   CALCIUM  9.0 06/10/2024   PROT 6.4 (L) 03/22/2023   ALBUMIN 3.7 03/22/2023   LABGLOB 2.6 07/30/2022   AGRATIO 1.6 07/30/2022   BILITOT 1.1 03/22/2023   ALKPHOS 73 03/22/2023   AST 22 03/22/2023   ALT 22 03/22/2023   ANIONGAP 11 06/10/2024   Last lipids Lab Results  Component Value Date   CHOL 152 03/24/2024   HDL 52 03/24/2024   LDLCALC 80 03/24/2024   TRIG 114 03/24/2024   CHOLHDL 2.9 03/24/2024   Last hemoglobin A1c Lab Results  Component Value Date   HGBA1C 6.1 (H) 03/24/2024   Last thyroid  functions Lab Results  Component  Value Date   TSH 1.370 09/25/2023   Last vitamin D  Lab Results  Component Value Date   VD25OH 26.1 (L) 03/24/2024   Last vitamin B12 and Folate Lab Results  Component Value Date   VITAMINB12 860 09/02/2023      The 10-year ASCVD risk score (Arnett DK, et al., 2019) is: 16.9%    Assessment & Plan:   Problem List Items Addressed This Visit       Cardiovascular and Mediastinum   Acute on chronic combined systolic and diastolic CHF (congestive heart failure) (HCC) - Primary   Currently stable.  He is to meet with the [facility] heart transplant team soon to discuss possible heart transplant.        Endocrine   Diabetes mellitus type 2 in nonobese [facility])   Lab Results  Component Value Date   HGBA1C 6.1 (H) 03/24/2024   HGBA1C 7.2 (H) 10/09/2023   HGBA1C 6.3 (H) 06/13/2023   Stable at this time.  No medication changes made.  No refills needed at this time.        Other   Hyperlipidemia   He is currently prescribed atorvastatin  40 mg daily.  Recent labs show good control of lipids.  No refills needed at this time.       Return in about 6 months (around 12/22/2024) for chronic follow-up with PCP.    Leita Longs, FNP

## 2024-06-29 ENCOUNTER — Telehealth (HOSPITAL_COMMUNITY): Payer: Self-pay | Admitting: Cardiology

## 2024-06-29 NOTE — Telephone Encounter (Signed)
 Patient returned call regarding lab results  Aware of 8/27 results and voiced  understanding

## 2024-06-29 NOTE — Assessment & Plan Note (Signed)
 Lab Results  Component Value Date   HGBA1C 6.1 (H) 03/24/2024   HGBA1C 7.2 (H) 10/09/2023   HGBA1C 6.3 (H) 06/13/2023   Stable at this time.  No medication changes made.  No refills needed at this time.

## 2024-06-29 NOTE — Progress Notes (Unsigned)
 Impression/Assessment:  Grade group 2 prostate cancer, high-volume, in the midst of EBRT and short-term ADT  Plan:    History of Present Illness: James [name] is here for f/u of ePCa.  8.27.2024: TRUS/Bx. PSA 7.5, prostate volume 51 mL, PSAD 0.15. 11/12 cores revealed adenocarcinoma.  9 cores--GG2 w/ PNI seen 2 cores--GGI pattern  He has started ADT (Orgovyx) as well as EBRT with Dr Dannielle.  Patient states that he has a couple of weeks of treatments left.  Altogether he has been doing well.  No hot flashes.  He has no dysuria or loose stools currently.  9.16.2025:  Past Medical History:  Diagnosis Date   CKD (chronic kidney disease) stage 2, GFR 60-89 ml/min    COPD (chronic obstructive pulmonary disease) (HCC)    Diabetes mellitus type II, non insulin  dependent (HCC)    Dilated aortic root (HCC)    Elevated PSA    Hypercholesteremia    Hypertension    NICM (nonischemic cardiomyopathy) (HCC)    NSVT (nonsustained ventricular tachycardia) (HCC)    Obstructive sleep apnea    Paroxysmal atrial fibrillation (HCC)    Pulmonary hypertension (HCC)    PVC's (premature ventricular contractions)    Tobacco abuse     Past Surgical History:  Procedure Laterality Date   CARDIAC CATHETERIZATION  10/15/2000   COLONOSCOPY N/A 03/22/2014   Procedure: COLONOSCOPY;  Surgeon: Margo LITTIE Haddock, MD;  Location: [facility] ENDO SUITE;  Service: Endoscopy;  Laterality: N/A;  11:15 AM   ICD IMPLANT N/A 08/21/2021   Procedure: ICD IMPLANT;  Surgeon: Waddell Danelle ORN, MD;  Location: [facility] INVASIVE CV LAB;  Service: Cardiovascular;  Laterality: N/A;   INGUINAL HERNIA REPAIR Right 06/24/2017   Procedure: HERNIA REPAIR INGUINAL ADULT WITH MESH;  Surgeon: Mavis Anes, MD;  Location: [facility] ORS;  Service: General;  Laterality: Right;   PROSTATE BIOPSY     RIGHT HEART CATH N/A 11/13/2022   Procedure: RIGHT HEART CATH;  Surgeon: Gardenia Led, DO;  Location: [facility] INVASIVE CV LAB;  Service: Cardiovascular;  Laterality: N/A;    RIGHT HEART CATH N/A 12/21/2022   Procedure: RIGHT HEART CATH;  Surgeon: Gardenia Led, DO;  Location: [facility] INVASIVE CV LAB;  Service: Cardiovascular;  Laterality: N/A;   RIGHT HEART CATH N/A 11/25/2023   Procedure: RIGHT HEART CATH;  Surgeon: Gardenia Led, DO;  Location: [facility] INVASIVE CV LAB;  Service: Cardiovascular;  Laterality: N/A;   RIGHT/LEFT HEART CATH AND CORONARY ANGIOGRAPHY N/A 07/19/2021   Procedure: RIGHT/LEFT HEART CATH AND CORONARY ANGIOGRAPHY;  Surgeon: Verlin Lonni BIRCH, MD;  Location: [facility] INVASIVE CV LAB;  Service: Cardiovascular;  Laterality: N/A;   TEE WITHOUT CARDIOVERSION N/A 12/21/2022   Procedure: TRANSESOPHAGEAL ECHOCARDIOGRAM (TEE);  Surgeon: Gardenia Led, DO;  Location: [facility] ENDOSCOPY;  Service: Cardiovascular;  Laterality: N/A;    Home Medications:  Allergies as of 06/30/2024   No Known Allergies      Medication List        Accurate as of June 29, 2024 12:52 PM. If you have any questions, ask your nurse or doctor.          acetaminophen  500 MG tablet Commonly known as: TYLENOL  Take 1,000 mg by mouth every 6 (six) hours as needed for moderate pain.   albuterol  108 (90 Base) MCG/ACT inhaler Commonly known as: VENTOLIN  HFA INHALE 2 PUFFS BY MOUTH FOUR TIMES DAILY AS NEEDED   apixaban  5 MG Tabs tablet Commonly known as: Eliquis  Take 1 tablet (5 mg total) by mouth 2 (two) times daily.  aspirin  EC 81 MG tablet Take 81 mg by mouth daily. Swallow whole.   atorvastatin  40 MG tablet Commonly known as: LIPITOR Take 1 tablet (40 mg total) by mouth daily.   carvedilol  3.125 MG tablet Commonly known as: COREG  Take 1 tablet (3.125 mg total) by mouth 2 (two) times daily with a meal.   Entresto  97-103 MG Generic drug: sacubitril -valsartan  TAKE 1 TABLET BY MOUTH TWICE DAILY   Farxiga  10 MG Tabs tablet Generic drug: dapagliflozin  propanediol TAKE 1 TABLET(10 MG) BY MOUTH DAILY BEFORE BREAKFAST   meclizine  25 MG tablet Commonly  known as: ANTIVERT  Take 1 tablet (25 mg total) by mouth 3 (three) times daily as needed for dizziness.   metFORMIN 500 MG tablet Commonly known as: GLUCOPHAGE TAKE 1 TABLET BY MOUTH TWICE DAILY   Orgovyx 120 MG tablet Generic drug: relugolix   spironolactone  25 MG tablet Commonly known as: ALDACTONE  Take 1 tablet (25 mg total) by mouth at bedtime.   torsemide  20 MG tablet Commonly known as: DEMADEX  Take 3 tablets (60 mg total) by mouth daily.        Allergies: No Known Allergies  Family History  Problem Relation Age of Onset   Coronary artery disease Mother    Diabetes Father    Heart attack Father    Hypertension Father    Iron deficiency Father    Thyroid  disease Sister    Cardiomyopathy Brother        No significant coronary disease by coronary angiography   Heart disease Maternal Grandfather        Also paternal grandfather   Sudden death Other    Colon cancer Neg Hx     Social History:  reports that he quit smoking about 2 years ago. His smoking use included cigarettes. He started smoking about 41 years ago. He has a 19.4 pack-year smoking history. He has never used smokeless tobacco. He reports that he does not drink alcohol and does not use drugs.  ROS: A complete review of systems was performed.  All systems are negative except for pertinent findings as noted.  Physical Exam:  Vital signs in last 24 hours: There were no vitals taken for this visit. Constitutional:  Alert and oriented, No acute distress Cardiovascular: Regular rate  Respiratory: Normal respiratory effort GI: Abdomen is soft, nontender, nondistended, no abdominal masses. No CVAT.  Genitourinary: Normal male phallus, testes are descended bilaterally and non-tender and without masses, scrotum is normal in appearance without lesions or masses, perineum is normal on inspection. Lymphatic: No lymphadenopathy Neurologic: Grossly intact, no focal deficits Psychiatric: Normal mood and affect  I  have reviewed prior pt notes   I have reviewed prior PSA results

## 2024-06-29 NOTE — Assessment & Plan Note (Signed)
 He is currently prescribed atorvastatin  40 mg daily.  Recent labs show good control of lipids.  No refills needed at this time.

## 2024-06-29 NOTE — Assessment & Plan Note (Signed)
 Currently stable.  He is to meet with the [facility] heart transplant team soon to discuss possible heart transplant.

## 2024-06-30 ENCOUNTER — Encounter: Payer: Self-pay | Admitting: Urology

## 2024-06-30 ENCOUNTER — Ambulatory Visit: Admitting: Urology

## 2024-06-30 VITALS — BP 99/66 | HR 73

## 2024-06-30 DIAGNOSIS — N5201 Erectile dysfunction due to arterial insufficiency: Secondary | ICD-10-CM

## 2024-06-30 DIAGNOSIS — R3129 Other microscopic hematuria: Secondary | ICD-10-CM

## 2024-06-30 DIAGNOSIS — C61 Malignant neoplasm of prostate: Secondary | ICD-10-CM | POA: Diagnosis not present

## 2024-06-30 DIAGNOSIS — R339 Retention of urine, unspecified: Secondary | ICD-10-CM

## 2024-06-30 LAB — URINALYSIS, ROUTINE W REFLEX MICROSCOPIC
Bilirubin, UA: NEGATIVE
Ketones, UA: NEGATIVE
Leukocytes,UA: NEGATIVE
Nitrite, UA: NEGATIVE
Protein,UA: NEGATIVE
Specific Gravity, UA: <1.005 — ABNORMAL LOW (ref 1.005–1.030)
Urobilinogen, Ur: 1 mg/dL (ref 0.2–1.0)
pH, UA: 6.5 (ref 5.0–7.5)

## 2024-06-30 LAB — MICROSCOPIC EXAMINATION
Bacteria, UA: NONE SEEN
Epithelial Cells (non renal): NONE SEEN /HPF (ref 0–10)
WBC, UA: NONE SEEN /HPF (ref 0–5)

## 2024-07-01 ENCOUNTER — Ambulatory Visit: Payer: Self-pay

## 2024-07-01 LAB — PSA: Prostate Specific Ag, Serum: 0.3 ng/mL (ref 0.0–4.0)

## 2024-07-01 NOTE — Telephone Encounter (Signed)
 Called pt to give PSA results per MD Dahlstedt unable to lvm due to vm box not being set up

## 2024-07-01 NOTE — Telephone Encounter (Signed)
-----   Message from Garnette HERO Dahlstedt sent at 07/01/2024  8:22 AM EDT ----- Please call pt--excellent PSA result-->0.3 ----- Message ----- From: Interface, [facility] Lab Results In Sent: 06/30/2024   3:36 PM EDT To: Garnette Shack, MD

## 2024-07-09 ENCOUNTER — Encounter (HOSPITAL_COMMUNITY): Payer: Self-pay

## 2024-07-09 ENCOUNTER — Other Ambulatory Visit: Payer: Self-pay

## 2024-07-09 ENCOUNTER — Emergency Department (HOSPITAL_COMMUNITY)

## 2024-07-09 ENCOUNTER — Inpatient Hospital Stay (HOSPITAL_COMMUNITY)
Admission: EM | Admit: 2024-07-09 | Discharge: 2024-07-17 | DRG: 291 | Disposition: A | Discharge status: Home Health | Attending: Internal Medicine | Admitting: Internal Medicine

## 2024-07-09 DIAGNOSIS — K029 Dental caries, unspecified: Secondary | ICD-10-CM | POA: Diagnosis not present

## 2024-07-09 DIAGNOSIS — Z8249 Family history of ischemic heart disease and other diseases of the circulatory system: Secondary | ICD-10-CM

## 2024-07-09 DIAGNOSIS — R570 Cardiogenic shock: Secondary | ICD-10-CM | POA: Diagnosis not present

## 2024-07-09 DIAGNOSIS — I5041 Acute combined systolic (congestive) and diastolic (congestive) heart failure: Secondary | ICD-10-CM | POA: Diagnosis not present

## 2024-07-09 DIAGNOSIS — Z6831 Body mass index (BMI) 31.0-31.9, adult: Secondary | ICD-10-CM

## 2024-07-09 DIAGNOSIS — I5084 End stage heart failure: Secondary | ICD-10-CM | POA: Diagnosis present

## 2024-07-09 DIAGNOSIS — R7989 Other specified abnormal findings of blood chemistry: Secondary | ICD-10-CM | POA: Insufficient documentation

## 2024-07-09 DIAGNOSIS — I5082 Biventricular heart failure: Secondary | ICD-10-CM | POA: Diagnosis present

## 2024-07-09 DIAGNOSIS — I5020 Unspecified systolic (congestive) heart failure: Secondary | ICD-10-CM | POA: Diagnosis not present

## 2024-07-09 DIAGNOSIS — Z79899 Other long term (current) drug therapy: Secondary | ICD-10-CM

## 2024-07-09 DIAGNOSIS — I130 Hypertensive heart and chronic kidney disease with heart failure and stage 1 through stage 4 chronic kidney disease, or unspecified chronic kidney disease: Secondary | ICD-10-CM | POA: Diagnosis not present

## 2024-07-09 DIAGNOSIS — N1832 Chronic kidney disease, stage 3b: Secondary | ICD-10-CM | POA: Diagnosis present

## 2024-07-09 DIAGNOSIS — I480 Paroxysmal atrial fibrillation: Secondary | ICD-10-CM | POA: Diagnosis present

## 2024-07-09 DIAGNOSIS — Z452 Encounter for adjustment and management of vascular access device: Secondary | ICD-10-CM | POA: Diagnosis not present

## 2024-07-09 DIAGNOSIS — Z833 Family history of diabetes mellitus: Secondary | ICD-10-CM

## 2024-07-09 DIAGNOSIS — Z7901 Long term (current) use of anticoagulants: Secondary | ICD-10-CM

## 2024-07-09 DIAGNOSIS — I071 Rheumatic tricuspid insufficiency: Secondary | ICD-10-CM | POA: Diagnosis not present

## 2024-07-09 DIAGNOSIS — E872 Acidosis, unspecified: Secondary | ICD-10-CM | POA: Diagnosis not present

## 2024-07-09 DIAGNOSIS — I472 Ventricular tachycardia, unspecified: Secondary | ICD-10-CM | POA: Diagnosis not present

## 2024-07-09 DIAGNOSIS — E7800 Pure hypercholesterolemia, unspecified: Secondary | ICD-10-CM | POA: Diagnosis present

## 2024-07-09 DIAGNOSIS — I428 Other cardiomyopathies: Secondary | ICD-10-CM | POA: Diagnosis not present

## 2024-07-09 DIAGNOSIS — N183 Chronic kidney disease, stage 3 unspecified: Secondary | ICD-10-CM | POA: Diagnosis not present

## 2024-07-09 DIAGNOSIS — C61 Malignant neoplasm of prostate: Secondary | ICD-10-CM | POA: Diagnosis not present

## 2024-07-09 DIAGNOSIS — N189 Chronic kidney disease, unspecified: Secondary | ICD-10-CM

## 2024-07-09 DIAGNOSIS — E8729 Other acidosis: Secondary | ICD-10-CM | POA: Diagnosis not present

## 2024-07-09 DIAGNOSIS — I5043 Acute on chronic combined systolic (congestive) and diastolic (congestive) heart failure: Secondary | ICD-10-CM | POA: Diagnosis not present

## 2024-07-09 DIAGNOSIS — Z8349 Family history of other endocrine, nutritional and metabolic diseases: Secondary | ICD-10-CM

## 2024-07-09 DIAGNOSIS — Z01810 Encounter for preprocedural cardiovascular examination: Secondary | ICD-10-CM | POA: Diagnosis not present

## 2024-07-09 DIAGNOSIS — I5023 Acute on chronic systolic (congestive) heart failure: Secondary | ICD-10-CM | POA: Diagnosis not present

## 2024-07-09 DIAGNOSIS — N400 Enlarged prostate without lower urinary tract symptoms: Secondary | ICD-10-CM | POA: Diagnosis not present

## 2024-07-09 DIAGNOSIS — G4733 Obstructive sleep apnea (adult) (pediatric): Secondary | ICD-10-CM | POA: Diagnosis present

## 2024-07-09 DIAGNOSIS — E1122 Type 2 diabetes mellitus with diabetic chronic kidney disease: Secondary | ICD-10-CM | POA: Diagnosis present

## 2024-07-09 DIAGNOSIS — N179 Acute kidney failure, unspecified: Secondary | ICD-10-CM | POA: Diagnosis present

## 2024-07-09 DIAGNOSIS — I4729 Other ventricular tachycardia: Secondary | ICD-10-CM | POA: Diagnosis present

## 2024-07-09 DIAGNOSIS — Z8546 Personal history of malignant neoplasm of prostate: Secondary | ICD-10-CM | POA: Diagnosis not present

## 2024-07-09 DIAGNOSIS — J449 Chronic obstructive pulmonary disease, unspecified: Secondary | ICD-10-CM | POA: Diagnosis not present

## 2024-07-09 DIAGNOSIS — I5A Non-ischemic myocardial injury (non-traumatic): Secondary | ICD-10-CM | POA: Diagnosis present

## 2024-07-09 DIAGNOSIS — Z95810 Presence of automatic (implantable) cardiac defibrillator: Secondary | ICD-10-CM | POA: Diagnosis not present

## 2024-07-09 DIAGNOSIS — Z515 Encounter for palliative care: Secondary | ICD-10-CM | POA: Diagnosis not present

## 2024-07-09 DIAGNOSIS — I272 Pulmonary hypertension, unspecified: Secondary | ICD-10-CM | POA: Diagnosis not present

## 2024-07-09 DIAGNOSIS — I509 Heart failure, unspecified: Secondary | ICD-10-CM | POA: Diagnosis not present

## 2024-07-09 DIAGNOSIS — D320 Benign neoplasm of cerebral meninges: Secondary | ICD-10-CM | POA: Diagnosis present

## 2024-07-09 DIAGNOSIS — Z7982 Long term (current) use of aspirin: Secondary | ICD-10-CM

## 2024-07-09 DIAGNOSIS — I4892 Unspecified atrial flutter: Secondary | ICD-10-CM | POA: Diagnosis present

## 2024-07-09 DIAGNOSIS — K7689 Other specified diseases of liver: Secondary | ICD-10-CM | POA: Diagnosis not present

## 2024-07-09 DIAGNOSIS — N1831 Chronic kidney disease, stage 3a: Secondary | ICD-10-CM | POA: Diagnosis not present

## 2024-07-09 DIAGNOSIS — R220 Localized swelling, mass and lump, head: Secondary | ICD-10-CM | POA: Diagnosis not present

## 2024-07-09 DIAGNOSIS — I2489 Other forms of acute ischemic heart disease: Secondary | ICD-10-CM | POA: Diagnosis not present

## 2024-07-09 DIAGNOSIS — E876 Hypokalemia: Secondary | ICD-10-CM | POA: Diagnosis not present

## 2024-07-09 DIAGNOSIS — I493 Ventricular premature depolarization: Secondary | ICD-10-CM | POA: Diagnosis present

## 2024-07-09 DIAGNOSIS — E66811 Obesity, class 1: Secondary | ICD-10-CM | POA: Diagnosis present

## 2024-07-09 DIAGNOSIS — Z7984 Long term (current) use of oral hypoglycemic drugs: Secondary | ICD-10-CM

## 2024-07-09 DIAGNOSIS — Z7189 Other specified counseling: Secondary | ICD-10-CM | POA: Diagnosis not present

## 2024-07-09 DIAGNOSIS — I2510 Atherosclerotic heart disease of native coronary artery without angina pectoris: Secondary | ICD-10-CM | POA: Diagnosis not present

## 2024-07-09 DIAGNOSIS — Z923 Personal history of irradiation: Secondary | ICD-10-CM

## 2024-07-09 DIAGNOSIS — Z87891 Personal history of nicotine dependence: Secondary | ICD-10-CM

## 2024-07-09 DIAGNOSIS — D3502 Benign neoplasm of left adrenal gland: Secondary | ICD-10-CM | POA: Diagnosis not present

## 2024-07-09 DIAGNOSIS — R0602 Shortness of breath: Secondary | ICD-10-CM | POA: Diagnosis not present

## 2024-07-09 DIAGNOSIS — K7200 Acute and subacute hepatic failure without coma: Secondary | ICD-10-CM | POA: Diagnosis not present

## 2024-07-09 DIAGNOSIS — Q2543 Congenital aneurysm of aorta: Secondary | ICD-10-CM | POA: Diagnosis not present

## 2024-07-09 DIAGNOSIS — R7401 Elevation of levels of liver transaminase levels: Secondary | ICD-10-CM | POA: Diagnosis not present

## 2024-07-09 DIAGNOSIS — I5089 Other heart failure: Secondary | ICD-10-CM | POA: Diagnosis not present

## 2024-07-09 DIAGNOSIS — I77810 Thoracic aortic ectasia: Secondary | ICD-10-CM | POA: Diagnosis not present

## 2024-07-09 LAB — URINALYSIS, ROUTINE W REFLEX MICROSCOPIC
Bacteria, UA: NONE SEEN
Bilirubin Urine: NEGATIVE
Glucose, UA: >=500 mg/dL — AB
Ketones, ur: NEGATIVE mg/dL
Leukocytes,Ua: NEGATIVE
Nitrite: NEGATIVE
Protein, ur: 100 mg/dL — AB
Specific Gravity, Urine: 1.009 (ref 1.005–1.030)
pH: 6 (ref 5.0–8.0)

## 2024-07-09 LAB — CBC WITH DIFFERENTIAL/PLATELET
Abs Immature Granulocytes: 0.04 K/uL (ref 0.00–0.07)
Basophils Absolute: 0 K/uL (ref 0.0–0.1)
Basophils Relative: 0 %
Eosinophils Absolute: 0 K/uL (ref 0.0–0.5)
Eosinophils Relative: 0 %
HCT: 45.5 % (ref 39.0–52.0)
Hemoglobin: 15.2 g/dL (ref 13.0–17.0)
Immature Granulocytes: 1 %
Lymphocytes Relative: 12 %
Lymphs Abs: 0.9 K/uL (ref 0.7–4.0)
MCH: 32.2 pg (ref 26.0–34.0)
MCHC: 33.4 g/dL (ref 30.0–36.0)
MCV: 96.4 fL (ref 80.0–100.0)
Monocytes Absolute: 0.8 K/uL (ref 0.1–1.0)
Monocytes Relative: 10 %
Neutro Abs: 6.1 K/uL (ref 1.7–7.7)
Neutrophils Relative %: 77 %
Platelets: 217 K/uL (ref 150–400)
RBC: 4.72 MIL/uL (ref 4.22–5.81)
RDW: 14.3 % (ref 11.5–15.5)
WBC: 7.9 K/uL (ref 4.0–10.5)
nRBC: 0 % (ref 0.0–0.2)

## 2024-07-09 LAB — COMPREHENSIVE METABOLIC PANEL WITH GFR
ALT: 42 U/L (ref 0–44)
AST: 30 U/L (ref 15–41)
Albumin: 4.2 g/dL (ref 3.5–5.0)
Alkaline Phosphatase: 113 U/L (ref 38–126)
Anion gap: 15 (ref 5–15)
BUN: 31 mg/dL — ABNORMAL HIGH (ref 8–23)
CO2: 25 mmol/L (ref 22–32)
Calcium: 9.4 mg/dL (ref 8.9–10.3)
Chloride: 97 mmol/L — ABNORMAL LOW (ref 98–111)
Creatinine, Ser: 1.78 mg/dL — ABNORMAL HIGH (ref 0.61–1.24)
GFR, Estimated: 42 mL/min — ABNORMAL LOW (ref >60)
Glucose, Bld: 195 mg/dL — ABNORMAL HIGH (ref 70–99)
Potassium: 3 mmol/L — ABNORMAL LOW (ref 3.5–5.1)
Sodium: 137 mmol/L (ref 135–145)
Total Bilirubin: 1.6 mg/dL — ABNORMAL HIGH (ref 0.0–1.2)
Total Protein: 7.7 g/dL (ref 6.5–8.1)

## 2024-07-09 LAB — BRAIN NATRIURETIC PEPTIDE: B Natriuretic Peptide: 3316 pg/mL — ABNORMAL HIGH (ref 0.0–100.0)

## 2024-07-09 LAB — TROPONIN I (HIGH SENSITIVITY)
Troponin I (High Sensitivity): 63 ng/L — ABNORMAL HIGH (ref <18)
Troponin I (High Sensitivity): 57 ng/L — ABNORMAL HIGH (ref <18)

## 2024-07-09 LAB — MAGNESIUM: Magnesium: 2.1 mg/dL (ref 1.7–2.4)

## 2024-07-09 MED ORDER — SODIUM CHLORIDE 0.9% FLUSH
3.0000 mL | Freq: Two times a day (BID) | INTRAVENOUS | Status: DC
  Administered 2024-07-09 – 2024-07-17 (×16): 3 mL via INTRAVENOUS

## 2024-07-09 MED ORDER — POTASSIUM CHLORIDE 10 MEQ/100ML IV SOLN
10.0000 meq | INTRAVENOUS | Status: AC
  Administered 2024-07-09 (×4): 10 meq via INTRAVENOUS
  Filled 2024-07-09 (×4): qty 100

## 2024-07-09 MED ORDER — SPIRONOLACTONE 25 MG PO TABS
25.0000 mg | ORAL_TABLET | Freq: Every day | ORAL | Status: DC
  Administered 2024-07-09 – 2024-07-10 (×2): 25 mg via ORAL
  Filled 2024-07-09 (×2): qty 1

## 2024-07-09 MED ORDER — DAPAGLIFLOZIN PROPANEDIOL 10 MG PO TABS
10.0000 mg | ORAL_TABLET | Freq: Every day | ORAL | Status: DC
  Administered 2024-07-10 – 2024-07-17 (×7): 10 mg via ORAL
  Filled 2024-07-09 (×9): qty 1

## 2024-07-09 MED ORDER — MELATONIN 3 MG PO TABS
6.0000 mg | ORAL_TABLET | Freq: Every evening | ORAL | Status: DC | PRN
  Administered 2024-07-10 – 2024-07-14 (×2): 6 mg via ORAL
  Filled 2024-07-09 (×2): qty 2

## 2024-07-09 MED ORDER — MAGNESIUM SULFATE 2 GM/50ML IV SOLN
2.0000 g | Freq: Once | INTRAVENOUS | Status: AC
  Administered 2024-07-09: 2 g via INTRAVENOUS
  Filled 2024-07-09: qty 50

## 2024-07-09 MED ORDER — CARVEDILOL 3.125 MG PO TABS
3.1250 mg | ORAL_TABLET | Freq: Two times a day (BID) | ORAL | Status: DC
  Administered 2024-07-10: 3.125 mg via ORAL
  Filled 2024-07-09: qty 1

## 2024-07-09 MED ORDER — ALBUTEROL SULFATE (2.5 MG/3ML) 0.083% IN NEBU: 2.5000 mg | INHALATION_SOLUTION | RESPIRATORY_TRACT | Status: DC | PRN

## 2024-07-09 MED ORDER — ATORVASTATIN CALCIUM 40 MG PO TABS
40.0000 mg | ORAL_TABLET | Freq: Every day | ORAL | Status: DC
  Administered 2024-07-10 – 2024-07-11 (×2): 40 mg via ORAL
  Filled 2024-07-09 (×2): qty 1

## 2024-07-09 MED ORDER — INSULIN ASPART 100 UNIT/ML IJ SOLN
0.0000 [IU] | Freq: Three times a day (TID) | INTRAMUSCULAR | Status: DC
  Administered 2024-07-10 (×3): 1 [IU] via SUBCUTANEOUS
  Administered 2024-07-11 (×2): 2 [IU] via SUBCUTANEOUS
  Administered 2024-07-12 (×3): 1 [IU] via SUBCUTANEOUS
  Administered 2024-07-13 (×2): 2 [IU] via SUBCUTANEOUS
  Administered 2024-07-13 – 2024-07-14 (×2): 1 [IU] via SUBCUTANEOUS
  Administered 2024-07-14: 3 [IU] via SUBCUTANEOUS

## 2024-07-09 MED ORDER — FUROSEMIDE 10 MG/ML IJ SOLN
60.0000 mg | Freq: Once | INTRAMUSCULAR | Status: AC
  Administered 2024-07-09: 60 mg via INTRAVENOUS
  Filled 2024-07-09: qty 6

## 2024-07-09 MED ORDER — ONDANSETRON HCL 4 MG/2ML IJ SOLN
4.0000 mg | Freq: Four times a day (QID) | INTRAMUSCULAR | Status: DC | PRN
  Administered 2024-07-10: 4 mg via INTRAVENOUS
  Filled 2024-07-09: qty 2

## 2024-07-09 MED ORDER — APIXABAN 5 MG PO TABS
5.0000 mg | ORAL_TABLET | Freq: Two times a day (BID) | ORAL | Status: DC
  Administered 2024-07-09 – 2024-07-17 (×16): 5 mg via ORAL
  Filled 2024-07-09 (×16): qty 1

## 2024-07-09 MED ORDER — ASPIRIN 81 MG PO TBEC
81.0000 mg | DELAYED_RELEASE_TABLET | Freq: Every day | ORAL | Status: DC
Start: 2024-07-10 — End: 2024-07-11
  Administered 2024-07-10 – 2024-07-11 (×2): 81 mg via ORAL
  Filled 2024-07-09 (×2): qty 1

## 2024-07-09 MED ORDER — POTASSIUM CHLORIDE CRYS ER 20 MEQ PO TBCR
40.0000 meq | EXTENDED_RELEASE_TABLET | Freq: Once | ORAL | Status: AC
  Administered 2024-07-09: 40 meq via ORAL
  Filled 2024-07-09: qty 2

## 2024-07-09 MED ORDER — ACETAMINOPHEN 500 MG PO TABS
1000.0000 mg | ORAL_TABLET | Freq: Four times a day (QID) | ORAL | Status: DC | PRN
  Administered 2024-07-10: 1000 mg via ORAL
  Filled 2024-07-09: qty 2

## 2024-07-09 MED ORDER — FUROSEMIDE 10 MG/ML IJ SOLN
120.0000 mg | Freq: Two times a day (BID) | INTRAVENOUS | Status: DC
  Administered 2024-07-10: 120 mg via INTRAVENOUS
  Filled 2024-07-09: qty 120
  Filled 2024-07-09 (×3): qty 12

## 2024-07-09 MED ORDER — POLYETHYLENE GLYCOL 3350 17 G PO PACK
17.0000 g | PACK | Freq: Every day | ORAL | Status: DC | PRN
  Administered 2024-07-15 – 2024-07-16 (×2): 17 g via ORAL
  Filled 2024-07-09 (×2): qty 1

## 2024-07-09 NOTE — ED Triage Notes (Signed)
 Pt arrived via POV from home c/o worsening SOB over the past 3 days. Pt reports using his inhaler w/o relief. Pt denies CP.

## 2024-07-09 NOTE — Progress Notes (Signed)
   07/09/24 2248  BiPAP/CPAP/SIPAP  $ Non-Invasive Ventilator  Non-Invasive Vent Set Up  $ Face Mask Medium Yes  BiPAP/CPAP/SIPAP Pt Type Adult  BiPAP/CPAP/SIPAP DREAMSTATIOND  Mask Type Nasal mask  Dentures removed? Not applicable  Mask Size Medium  Respiratory Rate 16 breaths/min  EPAP 5 cmH2O  FiO2 (%) 21 %  Patient Home Machine No  Patient Home Mask No  Patient Home Tubing No  Auto Titrate No  Device Plugged into RED Power Outlet Yes  BiPAP/CPAP /SiPAP Vitals  Pulse Rate (!) 104  Resp 16  SpO2 97 %  Bilateral Breath Sounds Diminished  MEWS Score/Color  MEWS Score 1  MEWS Score Color Green

## 2024-07-09 NOTE — Progress Notes (Signed)
 We contacted [employer] to interrogate the ICD. Unfortunately, they only interrogate devices during daytime hours. We will ask the day team to follow up with them.

## 2024-07-09 NOTE — H&P (Signed)
 History and Physical    James [name] FMW:[id number] DOB: [birth year] DOA: 07/09/2024  PCP: Bevely Doffing, FNP   Patient coming from: Home   Chief Complaint:  Chief Complaint  Patient presents with   Shortness of Breath    HPI:  James [name] is a 63 y.o. male with hx of NICM, HFrEF with BiV systolic failure, s/p ICD, under evaluation for heart transplant / LVAD, pulm HTN, paroxsymal Afib/flutter on AC, NSVT, aortic root aneurysm, CKD stage 3a, HTN, HLD, DM2, OSA, former smoker, prostate CA s/p EBRT on ADT, brain mass ? Meningioma has been referred to NSGY, who presented with DOE and worsening edema. Reports that symptoms have worsened over past 3-4 days, but particularly worse today. Notes increased abdominal distention mainly and lower extremity swelling.  His functional status typically he is able to go about 100 yards per his report or out of the parking lot.  Today he was unable to go from the waiting room to the hospital room without having to stop.  Denies any exertional chest pain.  No recent fever, chills, cough/cold.  Has been taking torsemide  60 mg daily.  He was weighing himself daily but sounds like that he has recently stopped doing this.  Notes a slow increase in weights from around 250 pound to 275 pounds over the past 5 months.  Still has a scale at home.  Denies any recent change in diet.   Review of Systems:  ROS complete and negative except as marked above   No Known Allergies  Prior to Admission medications   Medication Sig Start Date End Date Taking? Authorizing Provider  acetaminophen  (TYLENOL ) 500 MG tablet Take 1,000 mg by mouth every 6 (six) hours as needed for moderate pain.   Yes [provider]  albuterol  (VENTOLIN  HFA) 108 (90 Base) MCG/ACT inhaler INHALE 2 PUFFS BY MOUTH FOUR TIMES DAILY AS NEEDED 08/21/23  Yes Melvenia Manus BRAVO, MD  apixaban  (ELIQUIS ) 5 MG TABS tablet Take 1 tablet (5 mg total) by mouth 2 (two) times daily. 12/13/23  Yes  Waddell Danelle ORN, MD  aspirin  EC 81 MG tablet Take 81 mg by mouth daily. Swallow whole.   Yes [provider]  atorvastatin  (LIPITOR) 40 MG tablet Take 1 tablet (40 mg total) by mouth daily. 03/24/24  Yes Melvenia Manus BRAVO, MD  carvedilol  (COREG ) 3.125 MG tablet Take 1 tablet (3.125 mg total) by mouth 2 (two) times daily with a meal. 01/07/24  Yes Milford, Jessica M, FNP  ENTRESTO  97-103 MG TAKE 1 TABLET BY MOUTH TWICE DAILY 11/04/23  Yes Sabharwal, Aditya, DO  FARXIGA  10 MG TABS tablet TAKE 1 TABLET(10 MG) BY MOUTH DAILY BEFORE BREAKFAST 03/30/24  Yes Clegg, Amy D, NP  metFORMIN (GLUCOPHAGE) 500 MG tablet TAKE 1 TABLET BY MOUTH TWICE DAILY 05/04/24  Yes Bevely Doffing, FNP  spironolactone  (ALDACTONE ) 25 MG tablet Take 1 tablet (25 mg total) by mouth at bedtime. 01/07/24  Yes Milford, Harlene HERO, FNP  torsemide  (DEMADEX ) 20 MG tablet Take 3 tablets (60 mg total) by mouth daily. 05/26/24  Yes Marcine Caffie HERO, PA-C    Past Medical History:  Diagnosis Date   CKD (chronic kidney disease) stage 2, GFR 60-89 ml/min    COPD (chronic obstructive pulmonary disease) (HCC)    Diabetes mellitus type II, non insulin  dependent (HCC)    Dilated aortic root    Elevated PSA    Hypercholesteremia    Hypertension    NICM (nonischemic cardiomyopathy) (HCC)  NSVT (nonsustained ventricular tachycardia) (HCC)    Obstructive sleep apnea    Paroxysmal atrial fibrillation (HCC)    Pulmonary hypertension (HCC)    PVC's (premature ventricular contractions)    Tobacco abuse     Past Surgical History:  Procedure Laterality Date   CARDIAC CATHETERIZATION  10/15/2000   COLONOSCOPY N/A 03/22/2014   Procedure: COLONOSCOPY;  Surgeon: Margo LITTIE Haddock, MD;  Location: [facility] END[facility];  Service: Endoscopy;  Laterality: N/A;  11:15 AM   ICD IMPLANT N/A 08/21/2021   Procedure: ICD IMPLANT;  Surgeon: Waddell Danelle ORN, MD;  Location: [facility] INVASIVE CV LAB;  Service: Cardiovascular;  Laterality: N/A;   INGUINAL HERNIA REPAIR  Right 06/24/2017   Procedure: HERNIA REPAIR INGUINAL ADULT WITH MESH;  Surgeon: Mavis Anes, MD;  Location: [facility] ORS;  Service: General;  Laterality: Right;   PROSTATE BIOPSY     RIGHT HEART CATH N/A 11/13/2022   Procedure: RIGHT HEART CATH;  Surgeon: Gardenia Led, DO;  Location: [facility] INVASIVE CV LAB;  Service: Cardiovascular;  Laterality: N/A;   RIGHT HEART CATH N/A 12/21/2022   Procedure: RIGHT HEART CATH;  Surgeon: Gardenia Led, DO;  Location: [facility] INVASIVE CV LAB;  Service: Cardiovascular;  Laterality: N/A;   RIGHT HEART CATH N/A 11/25/2023   Procedure: RIGHT HEART CATH;  Surgeon: Gardenia Led, DO;  Location: [facility] INVASIVE CV LAB;  Service: Cardiovascular;  Laterality: N/A;   RIGHT/LEFT HEART CATH AND CORONARY ANGIOGRAPHY N/A 07/19/2021   Procedure: RIGHT/LEFT HEART CATH AND CORONARY ANGIOGRAPHY;  Surgeon: Verlin Lonni BIRCH, MD;  Location: [facility] INVASIVE CV LAB;  Service: Cardiovascular;  Laterality: N/A;   TEE WITHOUT CARDIOVERSION N/A 12/21/2022   Procedure: TRANSESOPHAGEAL ECHOCARDIOGRAM (TEE);  Surgeon: Gardenia Led, DO;  Location: [facility] ENDOSCOPY;  Service: Cardiovascular;  Laterality: N/A;     reports that he quit smoking about 2 years ago. His smoking use included cigarettes. He started smoking about 41 years ago. He has a 19.4 pack-year smoking history. He has never used smokeless tobacco. He reports that he does not drink alcohol and does not use drugs.  Family History  Problem Relation Age of Onset   Coronary artery disease Mother    Diabetes Father    Heart attack Father    Hypertension Father    Iron deficiency Father    Thyroid  disease Sister    Cardiomyopathy Brother        No significant coronary disease by coronary angiography   Heart disease Maternal Grandfather        Also paternal grandfather   Sudden death Other    Colon cancer Neg Hx      Physical Exam: Vitals:   07/09/24 1637 07/09/24 1638  BP: 122/74   Pulse: (!) 118   Resp: 20   Temp:  97.7 F (36.5 C)   TempSrc: Temporal   SpO2: 100%   Weight:  122.5 kg  Height:  6' 6 (1.981 m)    Gen: Awake, alert, NAD  Neck: JVD to mandible   CV: Regular, normal S1, S2, 1/6 SEM  Resp: Normal WOB, CTAB  Abd: Round, normoactive, nontender MSK: Symmetric, 1+ edema to the mid shin, trace above  Skin: No rashes or lesions to exposed skin  Neuro: Alert and interactive  Psych: euthymic, appropriate    Data review:   Labs reviewed, notable for:   BNP 3316 High-sensitivity Trop 63 -> 57 K3 Creatinine 1.78 Blood glucose 195 T. bili 1.6, other LFT within normal limit Blood counts unremarkable  Micro:  Results  for orders placed or performed in visit on 06/30/24  Microscopic Examination     Status: None   Collection Time: 06/30/24  2:01 PM   Urine  Result Value Ref Range Status   WBC, UA None seen 0 - 5 /hpf Final   RBC, Urine 0-2 0 - 2 /hpf Final   Epithelial Cells (non renal) None seen 0 - 10 /hpf Final   Bacteria, UA None seen None seen/Few Final    Imaging reviewed:  DG Chest 2 View Result Date: 07/09/2024 CLINICAL DATA:  Shortness of breath. EXAM: CHEST - 2 VIEW COMPARISON:  10/09/2023. FINDINGS: Bilateral lung fields are clear. Bilateral costophrenic angles are clear. Normal cardio-mediastinal silhouette. There is a left-sided ICD. No acute osseous abnormalities. The soft tissues are within normal limits. IMPRESSION: No active cardiopulmonary disease. Electronically Signed   By: Ree Molt M.D.   On: 07/09/2024 17:18    EKG:  Sinus tachycardia rate 110 with frequent PAC, multifocal PVCs, LAD, LAFB, IVCD, PRWP, small lateral Q wave, TWI laterally,   Tele reviewed:  Frequent ectopy with NSVT, polymorphic PVC.   Historical review;  Reviewed recent ICD check last month; PVC was 9% with prior mean ~ 10%.   ED Course:  Treated with Lasix  60 mg IV, KCl 40 mEq p.o. additional 4x runs IV, 2 g mag,   Assessment/Plan:  63 y.o. male with hx NICM, HFrEF with BiV  systolic failure, s/p ICD, under evaluation for heart transplant / LVAD, pulm HTN, paroxsymal Afib/flutter on AC, NSVT, aortic root aneurysm, CKD stage 3a, HTN, HLD, DM2, OSA, former smoker, prostate CA s/p EBRT on ADT, brain mass ? Meningioma has been referred to NSGY, who presented with DOE and worsening edema admitted with HF exacerbation.   Acute exacerbation, chronic BiV systolic failure  Hx NICM, HFrEF, ICD Follows with [facility] failure [facility], under evaluation for heart transplant / LVAD. P/w 3-4 days progressive DOE and abd distension / LE edema. Slower wt increase although not weighing recently; reports ~ 250 -> 275 over 5 months. On RA. BNP progressive increase up to 3316. HS trop flat / downtrending per below. CXR clear. Clinically appears more consistent with decompensated RV failure  -- Cardiology consult in AM; briefly discussed with Dr. Cesario. Feel OK for admission here to [facility] and will notify cardiology of consult in AM.  -- s/p lasix  60 mg IV in Ed, give additional 60 mg now for total 120 mg IV; tentatively ordered for 120 mg IV BID.  -- GDMT continue carvedilol , spironolactone , farxiga . Hold Entresto  in setting of AKI  -- Repeat echo  -- ICD interrogation  -- Low Na diet, HF navigator   Acute myocardial injury  No hx of chest pain. HS trop 63 -> 57. EKG appears mostly unchanged from prior, with exception increased atrial / ventricular ectopy; no overt ischemic change compared with prior. Suspect demand ischemia in setting of decompensated heart failure.  -- Management directed at HF per above  -- Continue home aspirin   -- Check lipids / A1c   Acute kidney injury stage I CKD stage 3a  Baseline Cr 1.3, up to 1.78 on admission. Likely cardiorenal in setting of heart failure  -- Diuresis per above  -- Check PVR with prostate CA hx  -- Hold home Entresto  for now   Hypokalemia -- repleted   Chronic medical problems:  Paroxysmal Afib/Flutter: currently SR with frequent  ectopy. On Bblocker per above, continue Apixaban .  NSVT: noted, will eval on ICD interrogation.  In past has been ~ 10% PVC, could consider for antiarrhythmic with his advanced heart failure  Aortic root aneurysm: OP surveillance  HTN: See GDMT above  HLD: Continue home atorvastatin   DM2: Hold metformin, continue farxiga . SSI for very sensitive  OSA: CPAP at bedtime   Former smoker: Continue to support  Prostate CA: s/p EBRT, on ADT. Followup urology OP  Brain mass: Under investigation, 0.9cm L ant falx suspected meningioma, referred to NSGY OP   Body mass index is 31.22 kg/m. Obesity class I would benefit from weight loss OP    DVT prophylaxis:  Eliquis  Code Status:  Full Code Diet:  Diet Orders (From admission, onward)    None      Family Communication:  None   Consults:  Cardiology in AM   Admission status:   Inpatient, Telemetry bed  Severity of Illness: The appropriate patient status for this patient is INPATIENT. Inpatient status is judged to be reasonable and necessary in order to provide the required intensity of service to ensure the patient's safety. The patient's presenting symptoms, physical exam findings, and initial radiographic and laboratory data in the context of their chronic comorbidities is felt to place them at high risk for further clinical deterioration. Furthermore, it is not anticipated that the patient will be medically stable for discharge from the hospital within 2 midnights of admission.   * I certify that at the point of admission it is my clinical judgment that the patient will require inpatient hospital care spanning beyond 2 midnights from the point of admission due to high intensity of service, high risk for further deterioration and high frequency of surveillance required.*   Dorn Dawson, MD [facility]  How to contact the [facility] Attending or Consulting provider 7A - 7P or covering provider during after hours 7P -7A, for this  patient.  Check the care team in [facility] and look for a) attending/consulting [facility] provider listed and b) the [facility] team listed Log into [email or web address] and use [place]'s universal password to access. If you do not have the password, please contact the hospital operator. Locate the [facility] provider you are looking for under [facility] and page to a number that you can be directly reached. If you still have difficulty reaching the provider, please page the [facility] (Director on Call) for the Hospitalists listed on amion for assistance.  07/09/2024, 8:15 PM

## 2024-07-09 NOTE — ED Provider Notes (Signed)
 [place] EMERGENCY DEPARTMENT AT [facility] Provider Note   CSN: [id number] Arrival date & time: 07/09/24  [id number]     Patient presents with: Shortness of Breath   James [name] is a 63 y.o. male.   Patient is a 63 year old male with a past medical history of CHF who presents emergency department for chief complaint of increased exertional dyspnea, orthopnea for approximate the past 3 days.  He notes that he feels as though his abdomen is distended and has had an increase edema in his lower extremities.  He has been compliant with his home torsemide .  Patient notes that he has had no associated chest pain.  He denies any dizziness, lightheadedness or syncope.  He denies any nausea, vomiting, diarrhea.  He has had no associated cough or congestion.   Shortness of Breath      Prior to Admission medications   Medication Sig Start Date End Date Taking? Authorizing Provider  acetaminophen  (TYLENOL ) 500 MG tablet Take 1,000 mg by mouth every 6 (six) hours as needed for moderate pain.   Yes [provider]  albuterol  (VENTOLIN  HFA) 108 (90 Base) MCG/ACT inhaler INHALE 2 PUFFS BY MOUTH FOUR TIMES DAILY AS NEEDED 08/21/23  Yes Melvenia Manus BRAVO, MD  apixaban  (ELIQUIS ) 5 MG TABS tablet Take 1 tablet (5 mg total) by mouth 2 (two) times daily. 12/13/23  Yes Waddell Danelle ORN, MD  aspirin  EC 81 MG tablet Take 81 mg by mouth daily. Swallow whole.   Yes [provider]  atorvastatin  (LIPITOR) 40 MG tablet Take 1 tablet (40 mg total) by mouth daily. 03/24/24  Yes Melvenia Manus BRAVO, MD  carvedilol  (COREG ) 3.125 MG tablet Take 1 tablet (3.125 mg total) by mouth 2 (two) times daily with a meal. 01/07/24  Yes Milford, Jessica M, FNP  ENTRESTO  97-103 MG TAKE 1 TABLET BY MOUTH TWICE DAILY 11/04/23  Yes Sabharwal, Aditya, DO  FARXIGA  10 MG TABS tablet TAKE 1 TABLET(10 MG) BY MOUTH DAILY BEFORE BREAKFAST 03/30/24  Yes Clegg, Amy D, NP  metFORMIN (GLUCOPHAGE) 500 MG tablet TAKE 1 TABLET BY  MOUTH TWICE DAILY 05/04/24  Yes Bevely Doffing, FNP  spironolactone  (ALDACTONE ) 25 MG tablet Take 1 tablet (25 mg total) by mouth at bedtime. 01/07/24  Yes Milford, Harlene HERO, FNP  torsemide  (DEMADEX ) 20 MG tablet Take 3 tablets (60 mg total) by mouth daily. 05/26/24  Yes Marcine Catalan M, PA-C    Allergies: Patient has no known allergies.    Review of Systems  Respiratory:  Positive for shortness of breath.   All other systems reviewed and are negative.   Updated Vital Signs BP (!) 114/99   Pulse 95   Temp 97.7 F (36.5 C) (Temporal)   Resp (!) 24   Ht 6' 6 (1.981 m)   Wt 122.5 kg   SpO2 95%   BMI 31.22 kg/m   Physical Exam Vitals and nursing note reviewed.  Constitutional:      General: He is not in acute distress.    Appearance: Normal appearance. He is not ill-appearing.  HENT:     Head: Normocephalic and atraumatic.     Nose: Nose normal.     Mouth/Throat:     Mouth: Mucous membranes are moist.  Eyes:     Extraocular Movements: Extraocular movements intact.     Conjunctiva/sclera: Conjunctivae normal.     Pupils: Pupils are equal, round, and reactive to light.  Cardiovascular:     Rate and Rhythm: Normal rate  and regular rhythm.     Pulses: Normal pulses.     Heart sounds: Normal heart sounds. No murmur heard.    No gallop.  Pulmonary:     Effort: Pulmonary effort is normal. No tachypnea.     Breath sounds: Normal breath sounds. No decreased breath sounds, wheezing, rhonchi or rales.  Chest:     Chest wall: No tenderness.  Abdominal:     General: Abdomen is flat. Bowel sounds are normal.     Palpations: Abdomen is soft.     Tenderness: There is no abdominal tenderness. There is no guarding.  Musculoskeletal:        General: Normal range of motion.     Cervical back: Normal range of motion and neck supple.     Right lower leg: Edema present.     Left lower leg: Edema present.  Skin:    General: Skin is warm and dry.     Findings: No rash.   Neurological:     General: No focal deficit present.     Mental Status: He is alert and oriented to person, place, and time. Mental status is at baseline.     Cranial Nerves: No cranial nerve deficit.     Motor: No weakness.  Psychiatric:        Mood and Affect: Mood normal.        Behavior: Behavior normal.        Thought Content: Thought content normal.        Judgment: Judgment normal.     (all labs ordered are listed, but only abnormal results are displayed) Labs Reviewed  BRAIN NATRIURETIC PEPTIDE - Abnormal; Notable for the following components:      Result Value   B Natriuretic Peptide 3,316.0 (*)    All other components within normal limits  COMPREHENSIVE METABOLIC PANEL WITH GFR - Abnormal; Notable for the following components:   Potassium 3.0 (*)    Chloride 97 (*)    Glucose, Bld 195 (*)    BUN 31 (*)    Creatinine, Ser 1.78 (*)    Total Bilirubin 1.6 (*)    GFR, Estimated 42 (*)    All other components within normal limits  TROPONIN I (HIGH SENSITIVITY) - Abnormal; Notable for the following components:   Troponin I (High Sensitivity) 63 (*)    All other components within normal limits  TROPONIN I (HIGH SENSITIVITY) - Abnormal; Notable for the following components:   Troponin I (High Sensitivity) 57 (*)    All other components within normal limits  CBC WITH DIFFERENTIAL/PLATELET  MAGNESIUM     EKG: EKG Interpretation Date/Time:  Thursday July 09 2024 16:41:09 EDT Ventricular Rate:  110 PR Interval:  168 QRS Duration:  110 QT Interval:  366 QTC Calculation: 495 R Axis:   -63  Text Interpretation: Undetermined rhythm Left anterior fascicular block Minimal voltage criteria for LVH, may be normal variant ( Cornell product ) Lateral infarct (cited on or before 09-Oct-2023) Abnormal ECG When compared with ECG of 14-May-2024 15:12, Current undetermined rhythm precludes rhythm comparison, needs review No significant change since last tracing Confirmed by  Dean Clarity [phone]) on 07/09/2024 6:04:01 PM  Radiology: DG Chest 2 View Result Date: 07/09/2024 CLINICAL DATA:  Shortness of breath. EXAM: CHEST - 2 VIEW COMPARISON:  10/09/2023. FINDINGS: Bilateral lung fields are clear. Bilateral costophrenic angles are clear. Normal cardio-mediastinal silhouette. There is a left-sided ICD. No acute osseous abnormalities. The soft tissues are within normal limits. IMPRESSION: No active  cardiopulmonary disease. Electronically Signed   By: Ree Molt M.D.   On: 07/09/2024 17:18     Procedures   Medications Ordered in the ED  potassium chloride  10 mEq in 100 mL IVPB (10 mEq Intravenous New Bag/Given 07/09/24 1959)  potassium chloride  SA (KLOR-CON  M) CR tablet 40 mEq (40 mEq Oral Given 07/09/24 1837)  magnesium  sulfate IVPB 2 g 50 mL (0 g Intravenous Stopped 07/09/24 1946)  furosemide  (LASIX ) injection 60 mg (60 mg Intravenous Given 07/09/24 1837)                                    Medical Decision Making Amount and/or Complexity of Data Reviewed Labs: ordered. Radiology: ordered.  Risk Prescription drug management. Decision regarding hospitalization.   This patient presents to the ED for concern of dyspnea, this involves an extensive number of treatment options, and is a complaint that carries with it a high risk of complications and morbidity.  The differential diagnosis includes CHF, COPD, ACS, pulmonary embolus, pericarditis, myocarditis, endocarditis, aortic aneurysm or dissection, pneumonia, pneumothorax, hemothorax   Co morbidities that complicate the patient evaluation  Hypertension, CHF, ischemic cardiomyopathy   Additional history obtained:  Additional history obtained from medical records External records from outside source obtained and reviewed including medical records   Lab Tests:  I Ordered, and personally interpreted labs.  The pertinent results include: No leukocytosis, no anemia, worsening creatinine from baseline,  hypokalemia, normal liver function, stable serial troponins, elevated BNP from baseline   Imaging Studies ordered:  I ordered imaging studies including chest x-ray I independently visualized and interpreted imaging which showed no acute cardiopulmonary process I agree with the radiologist interpretation   Cardiac Monitoring: / EKG:  The patient was maintained on a cardiac monitor.  I personally viewed and interpreted the cardiac monitored which showed an underlying rhythm of: Sinus rhythm with frequent PVCs and PACs, no ST/T wave changes, no ischemic changes, no STEMI   Consultations Obtained:  I requested consultation with the hospitalist,  and discussed lab and imaging findings as well as pertinent plan - they recommend: Admission   Problem List / ED Course / Critical interventions / Medication management  Patient does remain stable at this time.  Discussed with patient we will plan for admission to the hospital service given his apparent acute CHF.  His BNP is greatly worsened from previous.  Chest x-ray does not demonstrate any acute process at this time.  Low suspicion for pulmonary embolus.  Creatinine is worsened from baseline as well.  He has been given dose of Lasix  as well as started on potassium repletion.  Do not suspect underlying infectious source at this time.  Have discussed patient case with Dr. Segars with the hospitalist service who has sepsis for admission. I ordered medication including Lasix , magnesium , potassium for CHF, hypokalemia Reevaluation of the patient after these medicines showed that the patient improved I have reviewed the patients home medicines and have made adjustments as needed   Social Determinants of Health:  None   Test / Admission - Considered:  Admission     Final diagnoses:  Acute congestive heart failure, unspecified heart failure type (HCC)  Hypokalemia  Acute renal failure superimposed on chronic kidney disease, unspecified  acute renal failure type, unspecified CKD stage    ED Discharge Orders     None          Daralene Lonni BIRCH,  PA-C 07/09/24 2037    Dean Clarity, MD 07/10/24 [phone]

## 2024-07-10 ENCOUNTER — Inpatient Hospital Stay (HOSPITAL_COMMUNITY)

## 2024-07-10 DIAGNOSIS — I4729 Other ventricular tachycardia: Secondary | ICD-10-CM | POA: Diagnosis not present

## 2024-07-10 DIAGNOSIS — I480 Paroxysmal atrial fibrillation: Secondary | ICD-10-CM

## 2024-07-10 DIAGNOSIS — I5023 Acute on chronic systolic (congestive) heart failure: Secondary | ICD-10-CM | POA: Diagnosis not present

## 2024-07-10 DIAGNOSIS — N189 Chronic kidney disease, unspecified: Secondary | ICD-10-CM

## 2024-07-10 DIAGNOSIS — E876 Hypokalemia: Secondary | ICD-10-CM

## 2024-07-10 DIAGNOSIS — N179 Acute kidney failure, unspecified: Secondary | ICD-10-CM | POA: Diagnosis not present

## 2024-07-10 DIAGNOSIS — I5043 Acute on chronic combined systolic (congestive) and diastolic (congestive) heart failure: Secondary | ICD-10-CM | POA: Diagnosis not present

## 2024-07-10 DIAGNOSIS — N183 Chronic kidney disease, stage 3 unspecified: Secondary | ICD-10-CM | POA: Diagnosis not present

## 2024-07-10 DIAGNOSIS — R7989 Other specified abnormal findings of blood chemistry: Secondary | ICD-10-CM | POA: Diagnosis not present

## 2024-07-10 LAB — LIPID PANEL
Cholesterol: 134 mg/dL (ref 0–200)
HDL: 33 mg/dL — ABNORMAL LOW (ref >40)
LDL Cholesterol: 82 mg/dL (ref 0–99)
Total CHOL/HDL Ratio: 4.1 ratio
Triglycerides: 93 mg/dL (ref <150)
VLDL: 19 mg/dL (ref 0–40)

## 2024-07-10 LAB — BASIC METABOLIC PANEL WITH GFR
Anion gap: 13 (ref 5–15)
BUN: 34 mg/dL — ABNORMAL HIGH (ref 8–23)
CO2: 23 mmol/L (ref 22–32)
Calcium: 8.9 mg/dL (ref 8.9–10.3)
Chloride: 101 mmol/L (ref 98–111)
Creatinine, Ser: 1.78 mg/dL — ABNORMAL HIGH (ref 0.61–1.24)
GFR, Estimated: 42 mL/min — ABNORMAL LOW (ref >60)
Glucose, Bld: 191 mg/dL — ABNORMAL HIGH (ref 70–99)
Potassium: 3.8 mmol/L (ref 3.5–5.1)
Sodium: 137 mmol/L (ref 135–145)

## 2024-07-10 LAB — CBC
HCT: 45 % (ref 39.0–52.0)
Hemoglobin: 15.1 g/dL (ref 13.0–17.0)
MCH: 32 pg (ref 26.0–34.0)
MCHC: 33.6 g/dL (ref 30.0–36.0)
MCV: 95.3 fL (ref 80.0–100.0)
Platelets: 210 K/uL (ref 150–400)
RBC: 4.72 MIL/uL (ref 4.22–5.81)
RDW: 14.5 % (ref 11.5–15.5)
WBC: 11 K/uL — ABNORMAL HIGH (ref 4.0–10.5)
nRBC: 0 % (ref 0.0–0.2)

## 2024-07-10 LAB — GLUCOSE, CAPILLARY
Glucose-Capillary: 147 mg/dL — ABNORMAL HIGH (ref 70–99)
Glucose-Capillary: 163 mg/dL — ABNORMAL HIGH (ref 70–99)
Glucose-Capillary: 187 mg/dL — ABNORMAL HIGH (ref 70–99)
Glucose-Capillary: 185 mg/dL — ABNORMAL HIGH (ref 70–99)
Glucose-Capillary: 199 mg/dL — ABNORMAL HIGH (ref 70–99)

## 2024-07-10 LAB — ECHOCARDIOGRAM LIMITED
AR max vel: 3.1 cm2
AV Area VTI: 2.72 cm2
AV Area mean vel: 2.52 cm2
AV Mean grad: 1.9 mmHg
AV Peak grad: 3.2 mmHg
Ao pk vel: 0.9 m/s
Height: 78 in
MV M vel: 4.32 m/s
MV Peak grad: 74.6 mmHg
S' Lateral: 6.9 cm
Weight: 4148.18 [oz_av]

## 2024-07-10 LAB — HIV ANTIBODY (ROUTINE TESTING W REFLEX): HIV Screen 4th Generation wRfx: NONREACTIVE

## 2024-07-10 LAB — HEMOGLOBIN A1C
Hgb A1c MFr Bld: 6 % — ABNORMAL HIGH (ref 4.8–5.6)
Mean Plasma Glucose: 125.5 mg/dL

## 2024-07-10 LAB — TSH: TSH: 1.794 u[IU]/mL (ref 0.350–4.500)

## 2024-07-10 LAB — PHOSPHORUS: Phosphorus: 3.9 mg/dL (ref 2.5–4.6)

## 2024-07-10 LAB — MAGNESIUM: Magnesium: 2.7 mg/dL — ABNORMAL HIGH (ref 1.7–2.4)

## 2024-07-10 MED ORDER — POTASSIUM CHLORIDE CRYS ER 20 MEQ PO TBCR
40.0000 meq | EXTENDED_RELEASE_TABLET | Freq: Once | ORAL | Status: AC
  Administered 2024-07-10: 40 meq via ORAL
  Filled 2024-07-10: qty 2

## 2024-07-10 MED ORDER — FUROSEMIDE 10 MG/ML IJ SOLN: 60.0000 mg | Freq: Two times a day (BID) | INTRAMUSCULAR | Status: DC

## 2024-07-10 MED ORDER — ALUM & MAG HYDROXIDE-SIMETH 200-200-20 MG/5ML PO SUSP
30.0000 mL | Freq: Four times a day (QID) | ORAL | Status: DC | PRN
  Administered 2024-07-10 – 2024-07-14 (×4): 30 mL via ORAL
  Filled 2024-07-10 (×4): qty 30

## 2024-07-10 MED ORDER — PERFLUTREN LIPID MICROSPHERE
1.0000 mL | INTRAVENOUS | Status: AC | PRN
Start: 2024-07-10 — End: 2024-07-10
  Administered 2024-07-10: 3 mL via INTRAVENOUS

## 2024-07-10 NOTE — Progress Notes (Signed)
 Progress Note   Patient: James [name] FMW:[id number] DOB: 1961-07-30 DOA: 07/09/2024     1 DOS: the patient was seen and examined on 07/10/2024   Brief hospital admission narrative: As per H&P written by Dr. Keturah on 07/09/2024 James [name] is a 63 y.o. male with hx of NICM, HFrEF with BiV systolic failure, s/p ICD, under evaluation for heart transplant / LVAD, pulm HTN, paroxsymal Afib/flutter on AC, NSVT, aortic root aneurysm, CKD stage 3a, HTN, HLD, DM2, OSA, former smoker, prostate CA s/p EBRT on ADT, brain mass ? Meningioma has been referred to NSGY, who presented with DOE and worsening edema. Reports that symptoms have worsened over past 3-4 days, but particularly worse today. Notes increased abdominal distention mainly and lower extremity swelling.  His functional status typically he is able to go about 100 yards per his report or out of the parking lot.  Today he was unable to go from the waiting room to the hospital room without having to stop.  Denies any exertional chest pain.  No recent fever, chills, cough/cold.  Has been taking torsemide  60 mg daily.  He was weighing himself daily but sounds like that he has recently stopped doing this.  Notes a slow increase in weights from around 250 pound to 275 pounds over the past 5 months.  Still has a scale at home.  Denies any recent change in diet.   Assessment and plan 1-acute on chronic biventricular systolic heart failure - Patient with underlying history of nonischemic cardiomyopathy, ICD in place - No requiring oxygen  supplementation at rest; but reporting mild orthopnea and short winded sensation with activity. - Patient reports baseline weight around 250 and is currently 275 pounds approximately in over 42-month. - Continue IV diuretics and continue carvedilol , spironolactone  and Farxiga  - Given acute kidney injury Entresto  remain on hold (will reassess patient response for resumption time). - 2D echo has been repeated and  demonstrate further decline in his ejection fraction (now down to 15%). - Appreciate assistance and recommendation by cardiology service; patient will be transferred to [facility] for further evaluation and management by advanced heart failure team (given concerns for low output failure)  2-elevated troponin - Flat elevation most likely in the setting of demand ischemia from heart failure - Patient reports no chest pain. - Continue aspirin  and statin.  3-history of paroxysmal atrial fibrillation - Continue carvedilol  for rate control and continue Eliquis . - Continue telemetry monitoring and follow electrolytes with a goal for potassium above 4 and magnesium  above 2.  4-acute kidney injury on chronic kidney disease stage IIIa - Patient creatinine around 1.3 at baseline; on presentation up to 1.78 - Most likely decreased perfusion in the setting of heart failure - Continue to closely follow renal function trend in the setting of acute diuresis - Minimize the use of contrast and nephrotoxic agents.  5-hypertension - Continue current antihypertensive agents and follow vital signs.  6-hyperlipidemia - Continue statin.  7-history of prostate cancer -Continue patient follow-up with urology service.  Subjective:  Reporting short winded sensation with activity; no chest pain, no nausea, no vomiting.  Patient expresses mild orthopnea and some urine output (he was expecting more for the amount of lasix  received)  Physical Exam: Vitals:   07/09/24 2248 07/10/24 0247 07/10/24 0622 07/10/24 1335  BP:  (!) 114/96 (!) 134/102 124/83  Pulse: (!) 104 94 (!) 103 (!) 48  Resp: 16 18 18    Temp:  98.1 F (36.7 C) 98.6 F (37  C) 97.6 F (36.4 C)  TempSrc:  Oral Oral Oral  SpO2: 97% 97% 98% 100%  Weight:   117.6 kg   Height:       General exam: Alert, awake, oriented x 3; in no major distress. Respiratory system: Fine crackles at the bases, no wheezing on exam.  No using accessory  muscle. Cardiovascular system: Rate controlled, no rubs, no gallops, positive JVD. Gastrointestinal system: Abdomen is mildly distended and demonstrating increased abdominal girth; no guarding, no tenderness on palpation.  Positive bowel sounds.   Central nervous system:  No focal neurological deficits. Extremities: No cyanosis or clubbing; no significant edema appreciated. Skin: No petechiae. Psychiatry: Judgement and insight appear normal. Mood & affect appropriate.   Data Reviewed: Basic metabolic panel: Sodium 137, potassium 3.8, chloride 101, bicarb 23, BUN 34, creatinine 1.78 and GFR 42. CBC: WBCs 11.0, hemoglobin 15.1 and platelet count 210K Magnesium : 2.7 TSH: 1.794  Family Communication: No family at bedside.  Disposition: Status is: Inpatient Remains inpatient appropriate because: Continue IV diuresis  Patient will be transferred to [facility] for further evaluation and management by advanced heart failure team; there is concern for low output heart failure and further decompensation in his ejection fraction as demonstrated on 2D echo.  Time spent: 50 minutes  Author: Eric Nunnery, MD 07/10/2024 4:48 PM  For on call review [email or web address].

## 2024-07-10 NOTE — Plan of Care (Signed)
  Problem: Education: Goal: Knowledge of General Education information will improve Description: Including pain rating scale, medication(s)/side effects and non-pharmacologic comfort measures Outcome: Progressing   Problem: Health Behavior/Discharge Planning: Goal: Ability to manage health-related needs will improve Outcome: Progressing   Problem: Clinical Measurements: Goal: Ability to maintain clinical measurements within normal limits will improve Outcome: Progressing Goal: Will remain free from infection Outcome: Progressing Goal: Diagnostic test results will improve Outcome: Progressing Goal: Respiratory complications will improve Outcome: Progressing Goal: Cardiovascular complication will be avoided Outcome: Progressing   Problem: Activity: Goal: Risk for activity intolerance will decrease Outcome: Progressing   Problem: Nutrition: Goal: Adequate nutrition will be maintained Outcome: Progressing   Problem: Coping: Goal: Level of anxiety will decrease Outcome: Progressing   Problem: Elimination: Goal: Will not experience complications related to bowel motility Outcome: Progressing Goal: Will not experience complications related to urinary retention Outcome: Progressing   Problem: Pain Managment: Goal: General experience of comfort will improve and/or be controlled Outcome: Progressing   Problem: Safety: Goal: Ability to remain free from injury will improve Outcome: Progressing   Problem: Skin Integrity: Goal: Risk for impaired skin integrity will decrease Outcome: Progressing   Problem: Education: Goal: Ability to describe self-care measures that may prevent or decrease complications (Diabetes Survival Skills Education) will improve Outcome: Progressing Goal: Individualized Educational Video(s) Outcome: Progressing   Problem: Coping: Goal: Ability to adjust to condition or change in health will improve Outcome: Progressing   Problem: Fluid  Volume: Goal: Ability to maintain a balanced intake and output will improve Outcome: Progressing   Problem: Health Behavior/Discharge Planning: Goal: Ability to identify and utilize available resources and services will improve Outcome: Progressing Goal: Ability to manage health-related needs will improve Outcome: Progressing   Problem: Metabolic: Goal: Ability to maintain appropriate glucose levels will improve Outcome: Progressing   Problem: Skin Integrity: Goal: Risk for impaired skin integrity will decrease Outcome: Progressing   Problem: Tissue Perfusion: Goal: Adequacy of tissue perfusion will improve Outcome: Progressing

## 2024-07-10 NOTE — Progress Notes (Signed)
 Patient had episode of nausea and vomiting, states came on suddenly.  No other symptoms states feeling better after vomiting.  PRN Zofran  given provider Dr Keturah notified.

## 2024-07-10 NOTE — Progress Notes (Signed)
 Echo looks like LVEF has declined further to roughly 15%. Sluggish diuresis, received 120mg  of IV lasix  this AM with only 900mL 6 hours after dose. Had received IV lasix  120mg  this AM from admitting team, additional diuretic held. Prior admission diursed well with just 40mg  bid dosing.   Uptrending Cr from AM. BP's are stable. Concern for low output given degree of LV dysfunction, uptrend in Cr, sluggish diuresis. No cardiology over the weekend at [facility], I think he would do better at [facility] with HF team following. Will discuss with Dr Ricky transferring to medicine team at [facility], CHF team will see in consultation, I have spoke with Dr Rolan. Im going to hold his beta blocker for time being given concern for low output. Had received IV lasix  120mg  this AM from admitting team, additional diuretic held and would resume in AM.   Dorn Ross MD

## 2024-07-10 NOTE — Consult Note (Addendum)
 Cardiology Consultation   Patient ID: Aalijah Lanphere MRN: [id number]; DOB: 1961/03/14  Admit date: 07/09/2024 Date of Consult: 07/10/2024  PCP:  Bevely Doffing, FNP   [facility] Providers Cardiologist:  Maude Emmer, MD  Electrophysiologist:  Danelle Birmingham, MD   AHF: Dr. Gardenia  Patient Profile: Damichael Hofman is a 63 y.o. male with a hx of chronic HFrEF/NICM (dating back to 2011, initial cath showing no significant CAD and repeat cath in 07/2021 showing no evidence of CAD, EF 25-30% by echo in 04/2024), paroxysmal atrial fibrillation, aortic root aneurysm, history of prostate cancer (s/p radiation), Stage 3 CKD, CT Head in 02/2024 suggestive of meningioma and history of tobacco use who is being seen 07/10/2024 for the evaluation of CHF at the request of Dr. Ricky.  History of Present Illness: Mr. Banka is followed by the [facility] with most recent office visit being on 06/10/2024. Reported feeling well at that time. Weight was stable at 265 lbs and he was continued on Entresto  97-103 mg twice daily, Torsemide  60 mg daily, Coreg  3.125 mg twice daily, Spironolactone  25 mg daily and Farxiga  10 mg daily. Given that RHC and CPX had been consistent with Stage D heart failure, was being considered for LVAD or transplant.   He presented to [facility] ED on 07/09/2024 for evaluation of worsening shortness of breath and abdominal distention for the past 3 to 4 days. In talking with the patient today, he reports that he has noticed worsening fluid retention over the past few months. Says that family and friends have commented that he has put on weight around his abdomen  Says his weight is typically around 240 - 250 lbs but has been gradually increasing. Over the past 3 to 4 days, he reports worsening shortness of breath with minimal activity such as walking from room to room in his home. No specific orthopnea, PND or lower extremity edema but does report abdominal distention. No recent  chest pain or palpitations. Says he has been compliant with his medications and has not missed any doses. He has not tried taking extra Torsemide  at home.  Says that he tries to consume a low-sodium diet but did have 1 slice of pumpkin pie earlier this week and was concerned this could have triggered his events. In reviewing his fluid intake, he drinks from a 25 to 30 ounce thermos and reports filling this with water  at least 6-8 times within a 24-hour period. Also consumes juice.  Initial labs showed WBC 7.9, Hgb 15.2, platelets 217, Na+ 137, K+ 3.0 and creatinine 1.78 (baseline ~ 1.3). Mg 2.1. Hs Troponin flat at 63 and 57. BNP 3316.TSH 1.794. CXR with no active cardiopulmonary disease. EKG shows sinus tachycardia, HR 110 with PVC's and LAFB.   He has been continued on ASA, Eliquis , Atorvastatin , Coreg , Farxiga  and Spironolactone .  Entresto  was held. He received 60 mg IV Lasix  x 2 while in the ED and has been started on IV Lasix  120 mg twice daily. Full I's and O's not documented but he is listed as having lost blood which is likely inaccurate given no reports of this. Weight was listed at 270 lbs and 260 lbs on the day of admission. At 259 lbs today.  Repeat labs today show creatinine remains unchanged at 1.78. K+ at 3.8.  Past Medical History:  Diagnosis Date   CKD (chronic kidney disease) stage 2, GFR 60-89 ml/min    COPD (chronic obstructive pulmonary disease) (HCC)    Diabetes mellitus  type II, non insulin  dependent (HCC)    Dilated aortic root    Elevated PSA    Hypercholesteremia    Hypertension    NICM (nonischemic cardiomyopathy) (HCC)    NSVT (nonsustained ventricular tachycardia) (HCC)    Obstructive sleep apnea    Paroxysmal atrial fibrillation (HCC)    Pulmonary hypertension (HCC)    PVC's (premature ventricular contractions)    Tobacco abuse     Past Surgical History:  Procedure Laterality Date   CARDIAC CATHETERIZATION  10/15/2000   COLONOSCOPY N/A 03/22/2014   Procedure:  COLONOSCOPY;  Surgeon: Margo LITTIE Haddock, MD;  Location: [facility] ENDO SUITE;  Service: Endoscopy;  Laterality: N/A;  11:15 AM   ICD IMPLANT N/A 08/21/2021   Procedure: ICD IMPLANT;  Surgeon: Waddell Danelle ORN, MD;  Location: [facility] INVASIVE CV LAB;  Service: Cardiovascular;  Laterality: N/A;   INGUINAL HERNIA REPAIR Right 06/24/2017   Procedure: HERNIA REPAIR INGUINAL ADULT WITH MESH;  Surgeon: Mavis Anes, MD;  Location: [facility] ORS;  Service: General;  Laterality: Right;   PROSTATE BIOPSY     RIGHT HEART CATH N/A 11/13/2022   Procedure: RIGHT HEART CATH;  Surgeon: Gardenia Led, DO;  Location: [facility] INVASIVE CV LAB;  Service: Cardiovascular;  Laterality: N/A;   RIGHT HEART CATH N/A 12/21/2022   Procedure: RIGHT HEART CATH;  Surgeon: Gardenia Led, DO;  Location: [facility] INVASIVE CV LAB;  Service: Cardiovascular;  Laterality: N/A;   RIGHT HEART CATH N/A 11/25/2023   Procedure: RIGHT HEART CATH;  Surgeon: Gardenia Led, DO;  Location: [facility] INVASIVE CV LAB;  Service: Cardiovascular;  Laterality: N/A;   RIGHT/LEFT HEART CATH AND CORONARY ANGIOGRAPHY N/A 07/19/2021   Procedure: RIGHT/LEFT HEART CATH AND CORONARY ANGIOGRAPHY;  Surgeon: Verlin Lonni BIRCH, MD;  Location: [facility] INVASIVE CV LAB;  Service: Cardiovascular;  Laterality: N/A;   TEE WITHOUT CARDIOVERSION N/A 12/21/2022   Procedure: TRANSESOPHAGEAL ECHOCARDIOGRAM (TEE);  Surgeon: Gardenia Led, DO;  Location: [facility] ENDOSCOPY;  Service: Cardiovascular;  Laterality: N/A;     Home Medications:  Prior to Admission medications   Medication Sig Start Date End Date Taking? Authorizing Provider  acetaminophen  (TYLENOL ) 500 MG tablet Take 1,000 mg by mouth every 6 (six) hours as needed for moderate pain.   Yes [provider]  albuterol  (VENTOLIN  HFA) 108 (90 Base) MCG/ACT inhaler INHALE 2 PUFFS BY MOUTH FOUR TIMES DAILY AS NEEDED 08/21/23  Yes Melvenia Manus BRAVO, MD  apixaban  (ELIQUIS ) 5 MG TABS tablet Take 1 tablet (5 mg total) by mouth 2 (two) times  daily. 12/13/23  Yes Waddell Danelle ORN, MD  aspirin  EC 81 MG tablet Take 81 mg by mouth daily. Swallow whole.   Yes [provider]  atorvastatin  (LIPITOR) 40 MG tablet Take 1 tablet (40 mg total) by mouth daily. 03/24/24  Yes Melvenia Manus BRAVO, MD  carvedilol  (COREG ) 3.125 MG tablet Take 1 tablet (3.125 mg total) by mouth 2 (two) times daily with a meal. 01/07/24  Yes Milford, Jessica M, FNP  ENTRESTO  97-103 MG TAKE 1 TABLET BY MOUTH TWICE DAILY 11/04/23  Yes Sabharwal, Aditya, DO  FARXIGA  10 MG TABS tablet TAKE 1 TABLET(10 MG) BY MOUTH DAILY BEFORE BREAKFAST 03/30/24  Yes Clegg, Amy D, NP  metFORMIN (GLUCOPHAGE) 500 MG tablet TAKE 1 TABLET BY MOUTH TWICE DAILY 05/04/24  Yes Bevely Doffing, FNP  spironolactone  (ALDACTONE ) 25 MG tablet Take 1 tablet (25 mg total) by mouth at bedtime. 01/07/24  Yes Milford, Harlene HERO, FNP  torsemide  (DEMADEX ) 20 MG tablet Take 3 tablets (  60 mg total) by mouth daily. 05/26/24  Yes Marcine Catalan M, PA-C    Scheduled Meds:  apixaban   5 mg Oral BID   aspirin  EC  81 mg Oral Daily   atorvastatin   40 mg Oral Daily   carvedilol   3.125 mg Oral BID WC   dapagliflozin  propanediol  10 mg Oral Daily   insulin  aspart  0-6 Units Subcutaneous TID WC   sodium chloride  flush  3 mL Intravenous Q12H   spironolactone   25 mg Oral QHS   Continuous Infusions:  furosemide      PRN Meds: acetaminophen , albuterol , alum & mag hydroxide-simeth, melatonin, ondansetron  (ZOFRAN ) IV, polyethylene glycol  Allergies:   No Known Allergies  Social History:   Social History   Socioeconomic History   Marital status: Married    Spouse name: Yoshiharu Brassell   Number of children: 4   Years of education: Not on file   Highest education level: Associate degree: occupational, Scientist, product/process development, or vocational program  Occupational History   Occupation: Merchandiser, retail    Comment: [employer]  Tobacco Use   Smoking status: Former    Current packs/day: 0.00    Average packs/day: 0.5 packs/day for  38.8 years (19.4 ttl pk-yrs)    Types: Cigarettes    Start date: 10/15/1982    Quit date: 07/18/2021    Years since quitting: 2.9   Smokeless tobacco: Never   Tobacco comments:    3 cigaretts a day  Vaping Use   Vaping status: Never Used  Substance and Sexual Activity   Alcohol use: No    Alcohol/week: 0.0 standard drinks of alcohol   Drug use: No   Sexual activity: Yes    Birth control/protection: None  Other Topics Concern   Not on file  Social History Narrative   Divorced   No regular exercise    Family History:    Family History  Problem Relation Age of Onset   Coronary artery disease Mother    Diabetes Father    Heart attack Father    Hypertension Father    Iron deficiency Father    Thyroid  disease Sister    Cardiomyopathy Brother        No significant coronary disease by coronary angiography   Heart disease Maternal Grandfather        Also paternal grandfather   Sudden death Other    Colon cancer Neg Hx      ROS:  Please see the history of present illness.   All other ROS reviewed and negative.     Physical Exam/Data: Vitals:   07/09/24 2222 07/09/24 2248 07/10/24 0247 07/10/24 0622  BP: (!) 137/91  (!) 114/96 (!) 134/102  Pulse: (!) 102 (!) 104 94 (!) 103  Resp: 18 16 18 18   Temp: 98.3 F (36.8 C)  98.1 F (36.7 C) 98.6 F (37 C)  TempSrc: Oral  Oral Oral  SpO2: 98% 97% 97% 98%  Weight: 118.3 kg   117.6 kg  Height:        Intake/Output Summary (Last 24 hours) at 07/10/2024 0918 Last data filed at 07/10/2024 0300 Gross per 24 hour  Intake 240 ml  Output 900 ml  Net -660 ml      07/10/2024    6:22 AM 07/09/2024   10:22 PM 07/09/2024    4:38 PM  Last 3 Weights  Weight (lbs) 259 lb 4.2 oz 260 lb 12.9 oz 270 lb 1.9 oz  Weight (kg) 117.6 kg 118.3 kg 122.526 kg  Body mass index is 29.96 kg/m.  General:  Well nourished, well developed male appearing in no acute distress HEENT: normal Neck: JVD at 9-10 cm.  Vascular: No carotid bruits;  Distal pulses 2+ bilaterally Cardiac:  normal S1, S2; RRR with frequent ectopic beats.  Lungs: Rales along right base. No wheezing or rhonchi.  Abd: non-tender. Appears mildly distended.  Ext: no edema Musculoskeletal:  No deformities, BUE and BLE strength normal and equal Skin: warm and dry  Neuro:  CNs 2-12 intact, no focal abnormalities noted Psych:  Normal affect   Telemetry:  Telemetry was personally reviewed and demonstrates: Sinus tachycardia, heart rate in low 100's to 110's. Frequent PVC's and episodes of NSVT up to 7 beats.  Relevant CV Studies:  RHC: 11/2023 HEMODYNAMICS: RA:                  6 mmHg (mean) RV:                  44/6 mmHg PA:                  43/19 mmHg (30 mean) PCWP:            17 mmHg (mean)                                      Estimated Fick CO/CI   4.2 L/min, 1.7 L/min/m2 Thermodilution CO/CI   5.34 L/min, 2.2 L/min/m2                                            TPG                 13  mmHg                                            PVR                 2.5-3.1 Wood Units  PAPi                4       IMPRESSION: Mildly elevated pre and post capillary filling pressures Mildly elevated PA mean & PVR Moderate to severely reduced cardiac output & index  Echocardiogram: 04/2024 IMPRESSIONS     1. Left ventricular ejection fraction, by estimation, is 25 to 30%. Left  ventricular ejection fraction by 3D volume is 26 %. The left ventricle has  severely decreased function. The left ventricle demonstrates global  hypokinesis. The left ventricular  internal cavity size was severely dilated. Left ventricular diastolic  parameters are consistent with Grade III diastolic dysfunction  (restrictive).   2. Right ventricular systolic function is mildly reduced. The right  ventricular size is enlarged. There is severely elevated pulmonary artery  systolic pressure. The estimated right ventricular systolic pressure is  62.3 mmHg.   3. Left atrial size was  severely dilated.   4. The mitral valve is degenerative. Moderate mitral valve regurgitation.  No evidence of mitral stenosis.   5. The aortic valve is tricuspid. Aortic valve regurgitation is not  visualized. Aortic valve sclerosis is present, with no evidence of aortic  valve stenosis.  6. The inferior vena cava is dilated in size with <50% respiratory  variability, suggesting right atrial pressure of 15 mmHg.   7. Ascending aorta measurements are within normal limits for age when  indexed to body surface area.   Comparison(s): A prior study was performed on 10/11/2023. Mild MR is now  moderate and no significant change in LVEF.    Laboratory Data: High Sensitivity Troponin:   Recent Labs  Lab 07/09/24 1649 07/09/24 1831  TROPONINIHS 63* 57*     Chemistry Recent Labs  Lab 07/09/24 1649 07/09/24 1831 07/10/24 0450  NA 137  --  137  K 3.0*  --  3.8  CL 97*  --  101  CO2 25  --  23  GLUCOSE 195*  --  191*  BUN 31*  --  34*  CREATININE 1.78*  --  1.78*  CALCIUM  9.4  --  8.9  MG  --  2.1 2.7*  GFRNONAA 42*  --  42*  ANIONGAP 15  --  13    Recent Labs  Lab 07/09/24 1649  PROT 7.7  ALBUMIN 4.2  AST 30  ALT 42  ALKPHOS 113  BILITOT 1.6*   Lipids  Recent Labs  Lab 07/10/24 0450  CHOL 134  TRIG 93  HDL 33*  LDLCALC 82  CHOLHDL 4.1    Hematology Recent Labs  Lab 07/09/24 1649 07/10/24 0450  WBC 7.9 11.0*  RBC 4.72 4.72  HGB 15.2 15.1  HCT 45.5 45.0  MCV 96.4 95.3  MCH 32.2 32.0  MCHC 33.4 33.6  RDW 14.3 14.5  PLT 217 210   Thyroid   Recent Labs  Lab 07/10/24 0450  TSH 1.794    BNP Recent Labs  Lab 07/09/24 1649  BNP 3,316.0*    DDimer No results for input(s): DDIMER in the last 168 hours.  Radiology/Studies:  DG Chest 2 View Result Date: 07/09/2024 CLINICAL DATA:  Shortness of breath. EXAM: CHEST - 2 VIEW COMPARISON:  10/09/2023. FINDINGS: Bilateral lung fields are clear. Bilateral costophrenic angles are clear. Normal  cardio-mediastinal silhouette. There is a left-sided ICD. No acute osseous abnormalities. The soft tissues are within normal limits. IMPRESSION: No active cardiopulmonary disease. Electronically Signed   By: Ree Molt M.D.   On: 07/09/2024 17:18     Assessment and Plan:  1. Acute HFrEF/NICM - He has a known NICM dating back to 2011 and EF was at 25 to 30% by most recent echocardiogram in 04/2024. Being followed by AHF for consideration of LVAD or transplant but not interested in LVAD at this time. - Currently admitted for an acute CHF exacerbation and likely due to increased fluid intake as he has been consuming between 150 - 200 ounces of water  within a 24-hour period per his report. We reviewed the importance of trying to reduce fluid intake to approximately 2 L/day given his significant cardiomyopathy. - Unclear baseline weight as he reports this being in the 240's to 250's and was at 265 lbs on his last office visit but recorded at 259 lbs today (confirmed with him and this was a standing weight). Will need to establish a new dry weight as he still has some volume on examination. - Currently scheduled to receive IV Lasix  120mg  BID. Would likely reduce to 80mg  BID but will review with Dr. Alvan. If he continues to diurese well and renal function remains stable, he can likely remain at [facility] but if renal function worsens or unable to effectively diurese, would need  to transfer to [facility] for evaluation by the AHF team. - In regard to GDMT, continue Coreg  3.125 mg twice daily, Farxiga  10 mg daily and Spironolactone  25 mg daily. Entresto  currently held but would plan to resume within the next 24 to 48 hours as long as renal function and BP remain stable.   2. ICD in place/NSVT - He has a Biotronik ICD. This was interrogated this morning with the official report pending. Device was functioning normally by recent interrogation 05/2024 and it did show episodes of NSVT with the longest  lasting for 25 beats. - He does have frequent PVC's on telemetry and episodes of NSVT up to 7 beats. Labs this morning showed K+ at 3.8 and magnesium  2.7. Will order additional K+ supplementation. Keep K+ ~ 4.0 and Mg ~ 2.0.  3. Paroxysmal atrial fibrillation - He is currently normal sinus rhythm by review of telemetry. He has been continued on Eliquis  5 mg twice daily for anticoagulation and Coreg  3.125 mg twice daily for rate control.  4. Aortic root dilatation - Noted to have aneurysm of the aortic root at 50 mm by echocardiogram in 07/2022 but echocardiogram in 04/2024 said measurements were within normal limits.  Can reassess as an outpatient.  5. Stage 3 CKD - Baseline creatinine ~ 1.3.  Elevated at 1.78 on admission and stable at this today. Continue to follow with diuresis.  Risk Assessment/Risk Scores:  [place]  Heart Association (NYHA) Functional Class NYHA Class III   For questions or updates, please contact [facility] Please consult [email or web address] for contact info under   Signed, Laymon CHRISTELLA Qua, PA-C  07/10/2024 9:18 AM  Attending note Patient seen and discussed with PA Qua, I agree with her documentation. 63 yo male hstory of NICM/HFrEF, AICD, HTN, DM2, CKD IIIA, aortic root aneurysm, PAF/aflutter, admitted with SOB and abdominal distension. Reports compliance with home medications   BNP 3316 WBC 7.9 Hgb 15.2 Plt 217 Na 137 K 3 Cr 1.78 BUN 31 Mg 2.1  Trop 63-->57 - EKG SR, PACs, PVCs - 04/2024 echo: LVEF 25-30%, grade III dd, mild RV dysfunction, severe pulm HTN, mod MR - CXR no acute process.  1.Acute on chronic HFrEF - long history of NICM, followed in HF clinic - from HF clinic notes RHC and CPX consistent with stage D heart failure with severe heart failure limitations.  He has completed most of his LVAD evaluation and has been discussed at [facility]   - 04/2024 echo: LVEF 25-30%, grade III dd, mild RV dysfunction, severe pulm HTN, mod MR - CXR no  acute process. BNP 3316  - started on IV lasix , 60mg  x 2 yesterday. Scheduled for 120mg  bid today IV. Early incomplete I/Os, wt 260-->259 lbs.Cr is stable. Very high dose lasix , from last admit was diursed with 40mg  bid. Would plan for 60mg  bid starting tomorrow since got 120mg  this AM.  - medical therapy with coreg  3.125mg  bid, farxiga  30m, aldactone  25mg . Entresto  initially held given AKi.  - continue IV diuresis, if difficultly diuresing over the weekend would reach out to HF team at [facility] who follow him closely  Dorn Ross MD

## 2024-07-10 NOTE — TOC Initial Note (Signed)
 Transition of Care [facility]) - Initial/Assessment Note    Patient Details  Name: James [name] MRN: [id number] Date of Birth: 1961/07/16  [facility] CM/SW Contact:    Lucie Lunger, LCSWA Phone Number: 07/10/2024, 11:06 AM  Clinical Narrative:                 Pt is high risk for readmission and TOC consulted for CHF screen. CSW spoke with pt at bedside to complete assessment. Pt lives with his wife. Pt states he is independent in completing his ADLs and drives to appointments when needed. Pt has not had HH and does not use any DME.   Pt states he does not weigh daily but a couple times a week. Pt states he takes his medications as prescribed. Pt states he tries to follow a heart healthy diet. TOC to follow.   Expected Discharge Plan: Home/Self Care Barriers to Discharge: Continued Medical Work up   Patient Goals and CMS Choice Patient states their goals for this hospitalization and ongoing recovery are:: return home CMS Medicare.gov Compare Post Acute Care list provided to:: Patient Choice offered to / list presented to : Patient      Expected Discharge Plan and Services In-house Referral: Clinical Social Work Discharge Planning Services: CM Consult   Living arrangements for the past 2 months: Single Family Home                                      Prior Living Arrangements/Services Living arrangements for the past 2 months: Single Family Home Lives with:: Spouse Patient language and need for interpreter reviewed:: Yes Do you feel safe going back to the place where you live?: Yes      Need for Family Participation in Patient Care: Yes (Comment) Care giver support system in place?: Yes (comment)   Criminal Activity/Legal Involvement Pertinent to Current Situation/Hospitalization: No - Comment as needed  Activities of Daily Living   ADL Screening (condition at time of admission) Independently performs ADLs?: Yes (appropriate for developmental  age) Is the patient deaf or have difficulty hearing?: No Does the patient have difficulty seeing, even when wearing glasses/contacts?: No Does the patient have difficulty concentrating, remembering, or making decisions?: No  Permission Sought/Granted                  Emotional Assessment Appearance:: Appears stated age Attitude/Demeanor/Rapport: Engaged Affect (typically observed): Accepting Orientation: : Oriented to Self, Oriented to Place, Oriented to  Time, Oriented to Situation Alcohol / Substance Use: Not Applicable Psych Involvement: No (comment)  Admission diagnosis:  Hypokalemia [E87.6] Acute exacerbation of CHF (congestive heart failure) (HCC) [I50.9] Acute congestive heart failure, unspecified heart failure type (HCC) [I50.9] Acute renal failure superimposed on chronic kidney disease, unspecified acute renal failure type, unspecified CKD stage [N17.9, N18.9] Patient Active Problem List   Diagnosis Date Noted   AKI (acute kidney injury) 07/09/2024   Elevated troponin 07/09/2024   Mass of brain 03/24/2024   Vitamin D  deficiency 12/02/2023   Malignant neoplasm of prostate (HCC) 11/17/2023   Paroxysmal atrial fibrillation (HCC) 10/09/2023   Bilateral lower extremity edema 09/25/2023   Vertigo 08/29/2023   Elevated PSA 06/13/2023   Abnormal echocardiogram 08/24/2022   Stage 3a chronic kidney disease (HCC) 08/24/2022   HFrEF (heart failure with reduced ejection fraction) (HCC) 08/23/2022   Asthma 07/30/2022   Encounter to establish care 07/30/2022  Acute exacerbation of CHF (congestive heart failure) (HCC) 07/22/2022   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   NSVT (nonsustained ventricular tachycardia) 07/21/2021   Pulmonary hypertension, unspecified (HCC) 07/21/2021   Tobacco abuse 07/21/2021   Diabetes mellitus type 2 in nonobese (HCC) 07/21/2021   Acute on chronic combined systolic and diastolic CHF (congestive heart failure) (HCC)    Non-recurrent unilateral inguinal  hernia without obstruction or gangrene    Perforation bowel (HCC) 06/19/2014   S/P colonoscopy with polypectomy 06/18/2014   Hyperlipidemia 11/04/2012   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   Cholelithiasis    Obstructive sleep apnea 04/28/2012   Cardiomyopathy, nonischemic (HCC) 04/03/2012   Essential hypertension 08/07/2010   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 & [address] [place] [address] Phone: [phone] Fax: [phone]     Social Drivers of Health (SDOH) Social History: SDOH Screenings   Food Insecurity: No Food Insecurity (07/09/2024)  Housing: Low Risk  (07/09/2024)  Transportation Needs: No Transportation Needs (07/09/2024)  Utilities: Not At Risk (07/09/2024)  Alcohol Screen: Low Risk  (11/18/2023)  Depression (PHQ2-9): Low Risk  (03/24/2024)  Financial Resource Strain: Low Risk  (07/21/2021)  Tobacco Use: Medium Risk (07/09/2024)   SDOH Interventions:     Readmission Risk Interventions    07/10/2024   11:05 AM 10/10/2023   10:37 AM  Readmission Risk Prevention Plan  Transportation Screening Complete Complete  Home Care Screening Complete Complete  Medication Review (RN CM) Complete Complete

## 2024-07-10 NOTE — Progress Notes (Signed)
 Patient refused CPAP, CPAP in room

## 2024-07-11 ENCOUNTER — Other Ambulatory Visit: Payer: Self-pay

## 2024-07-11 ENCOUNTER — Inpatient Hospital Stay (HOSPITAL_COMMUNITY)

## 2024-07-11 DIAGNOSIS — K7200 Acute and subacute hepatic failure without coma: Secondary | ICD-10-CM

## 2024-07-11 DIAGNOSIS — R7989 Other specified abnormal findings of blood chemistry: Secondary | ICD-10-CM | POA: Diagnosis not present

## 2024-07-11 DIAGNOSIS — I4729 Other ventricular tachycardia: Secondary | ICD-10-CM | POA: Diagnosis not present

## 2024-07-11 DIAGNOSIS — Z452 Encounter for adjustment and management of vascular access device: Secondary | ICD-10-CM | POA: Diagnosis not present

## 2024-07-11 DIAGNOSIS — Z95810 Presence of automatic (implantable) cardiac defibrillator: Secondary | ICD-10-CM | POA: Diagnosis not present

## 2024-07-11 DIAGNOSIS — N1831 Chronic kidney disease, stage 3a: Secondary | ICD-10-CM

## 2024-07-11 DIAGNOSIS — E8729 Other acidosis: Secondary | ICD-10-CM

## 2024-07-11 DIAGNOSIS — I5043 Acute on chronic combined systolic (congestive) and diastolic (congestive) heart failure: Secondary | ICD-10-CM | POA: Diagnosis not present

## 2024-07-11 DIAGNOSIS — N179 Acute kidney failure, unspecified: Secondary | ICD-10-CM | POA: Diagnosis not present

## 2024-07-11 LAB — CBC WITH DIFFERENTIAL/PLATELET
Abs Immature Granulocytes: 0.07 K/uL (ref 0.00–0.07)
Basophils Absolute: 0 K/uL (ref 0.0–0.1)
Basophils Relative: 0 %
Eosinophils Absolute: 0 K/uL (ref 0.0–0.5)
Eosinophils Relative: 0 %
HCT: 48.1 % (ref 39.0–52.0)
Hemoglobin: 15.7 g/dL (ref 13.0–17.0)
Immature Granulocytes: 1 %
Lymphocytes Relative: 5 %
Lymphs Abs: 0.6 K/uL — ABNORMAL LOW (ref 0.7–4.0)
MCH: 32.2 pg (ref 26.0–34.0)
MCHC: 32.6 g/dL (ref 30.0–36.0)
MCV: 98.6 fL (ref 80.0–100.0)
Monocytes Absolute: 1.4 K/uL — ABNORMAL HIGH (ref 0.1–1.0)
Monocytes Relative: 11 %
Neutro Abs: 10.4 K/uL — ABNORMAL HIGH (ref 1.7–7.7)
Neutrophils Relative %: 83 %
Platelets: 146 K/uL — ABNORMAL LOW (ref 150–400)
RBC: 4.88 MIL/uL (ref 4.22–5.81)
RDW: 15.2 % (ref 11.5–15.5)
WBC: 12.5 K/uL — ABNORMAL HIGH (ref 4.0–10.5)
nRBC: 0.5 % — ABNORMAL HIGH (ref 0.0–0.2)

## 2024-07-11 LAB — GLUCOSE, CAPILLARY
Glucose-Capillary: 146 mg/dL — ABNORMAL HIGH (ref 70–99)
Glucose-Capillary: 207 mg/dL — ABNORMAL HIGH (ref 70–99)
Glucose-Capillary: 236 mg/dL — ABNORMAL HIGH (ref 70–99)

## 2024-07-11 LAB — COMPREHENSIVE METABOLIC PANEL WITH GFR
ALT: 905 U/L — ABNORMAL HIGH (ref 0–44)
AST: 590 U/L — ABNORMAL HIGH (ref 15–41)
Albumin: 3.9 g/dL (ref 3.5–5.0)
Alkaline Phosphatase: 137 U/L — ABNORMAL HIGH (ref 38–126)
Anion gap: 15 (ref 5–15)
BUN: 48 mg/dL — ABNORMAL HIGH (ref 8–23)
CO2: 22 mmol/L (ref 22–32)
Calcium: 8.9 mg/dL (ref 8.9–10.3)
Chloride: 100 mmol/L (ref 98–111)
Creatinine, Ser: 2.24 mg/dL — ABNORMAL HIGH (ref 0.61–1.24)
GFR, Estimated: 32 mL/min — ABNORMAL LOW (ref >60)
Glucose, Bld: 168 mg/dL — ABNORMAL HIGH (ref 70–99)
Potassium: 4.2 mmol/L (ref 3.5–5.1)
Sodium: 137 mmol/L (ref 135–145)
Total Bilirubin: 4.6 mg/dL — ABNORMAL HIGH (ref 0.0–1.2)
Total Protein: 7.3 g/dL (ref 6.5–8.1)

## 2024-07-11 LAB — URINALYSIS, ROUTINE W REFLEX MICROSCOPIC
Bilirubin Urine: NEGATIVE
Glucose, UA: >=500 mg/dL — AB
Ketones, ur: NEGATIVE mg/dL
Leukocytes,Ua: NEGATIVE
Nitrite: NEGATIVE
Protein, ur: 100 mg/dL — AB
Specific Gravity, Urine: 1.015 (ref 1.005–1.030)
pH: 5 (ref 5.0–8.0)

## 2024-07-11 LAB — LACTIC ACID, PLASMA
Lactic Acid, Venous: 3.9 mmol/L (ref 0.5–1.9)
Lactic Acid, Venous: 5.3 mmol/L (ref 0.5–1.9)
Lactic Acid, Venous: 5.8 mmol/L (ref 0.5–1.9)
Lactic Acid, Venous: 2.8 mmol/L (ref 0.5–1.9)

## 2024-07-11 LAB — COOXEMETRY PANEL
Carboxyhemoglobin: 1.1 % (ref 0.5–1.5)
Carboxyhemoglobin: 1.7 % — ABNORMAL HIGH (ref 0.5–1.5)
Carboxyhemoglobin: 2.4 % — ABNORMAL HIGH (ref 0.5–1.5)
Methemoglobin: <0.7 % (ref 0.0–1.5)
Methemoglobin: <0.7 % (ref 0.0–1.5)
Methemoglobin: 0.8 % (ref 0.0–1.5)
O2 Saturation: 47.4 %
O2 Saturation: 70.5 %
O2 Saturation: 70.7 %
Total hemoglobin: 18.6 g/dL — ABNORMAL HIGH (ref 12.0–16.0)
Total hemoglobin: 14.5 g/dL (ref 12.0–16.0)
Total hemoglobin: 14.5 g/dL (ref 12.0–16.0)

## 2024-07-11 LAB — BASIC METABOLIC PANEL WITH GFR
Anion gap: 13 (ref 5–15)
BUN: 50 mg/dL — ABNORMAL HIGH (ref 8–23)
CO2: 25 mmol/L (ref 22–32)
Calcium: 8.8 mg/dL — ABNORMAL LOW (ref 8.9–10.3)
Chloride: 99 mmol/L (ref 98–111)
Creatinine, Ser: 2.33 mg/dL — ABNORMAL HIGH (ref 0.61–1.24)
GFR, Estimated: 31 mL/min — ABNORMAL LOW (ref >60)
Glucose, Bld: 190 mg/dL — ABNORMAL HIGH (ref 70–99)
Potassium: 4.1 mmol/L (ref 3.5–5.1)
Sodium: 137 mmol/L (ref 135–145)

## 2024-07-11 LAB — MAGNESIUM: Magnesium: 2.8 mg/dL — ABNORMAL HIGH (ref 1.7–2.4)

## 2024-07-11 LAB — MRSA NEXT GEN BY PCR, NASAL: MRSA by PCR Next Gen: NOT DETECTED

## 2024-07-11 MED ORDER — MILRINONE LACTATE IN DEXTROSE 20-5 MG/100ML-% IV SOLN
0.2500 ug/kg/min | INTRAVENOUS | Status: DC
  Administered 2024-07-11 (×2): 0.25 ug/kg/min via INTRAVENOUS
  Administered 2024-07-12 (×2): 0.375 ug/kg/min via INTRAVENOUS
  Administered 2024-07-12: 0.25 ug/kg/min via INTRAVENOUS
  Administered 2024-07-13 (×3): 0.375 ug/kg/min via INTRAVENOUS
  Administered 2024-07-14: 0.25 ug/kg/min via INTRAVENOUS
  Administered 2024-07-14: 0.375 ug/kg/min via INTRAVENOUS
  Administered 2024-07-15: 0.25 ug/kg/min via INTRAVENOUS
  Filled 2024-07-11 (×11): qty 100

## 2024-07-11 MED ORDER — FUROSEMIDE 10 MG/ML IJ SOLN
5.0000 mg/h | INTRAVENOUS | Status: DC
  Administered 2024-07-11: 10 mg/h via INTRAVENOUS
  Administered 2024-07-12 – 2024-07-13 (×3): 15 mg/h via INTRAVENOUS
  Filled 2024-07-11 (×4): qty 20

## 2024-07-11 MED ORDER — SODIUM CHLORIDE 0.9% FLUSH: 10.0000 mL | INTRAVENOUS | Status: DC | PRN

## 2024-07-11 MED ORDER — MAGNESIUM SULFATE IN D5W 1-5 GM/100ML-% IV SOLN: 1.0000 g | Freq: Once | INTRAVENOUS | Status: DC

## 2024-07-11 MED ORDER — AMIODARONE HCL IN DEXTROSE 360-4.14 MG/200ML-% IV SOLN
30.0000 mg/h | INTRAVENOUS | Status: DC
  Administered 2024-07-11 – 2024-07-12 (×4): 30 mg/h via INTRAVENOUS
  Administered 2024-07-13: 60 mg/h via INTRAVENOUS
  Administered 2024-07-13: 30 mg/h via INTRAVENOUS
  Administered 2024-07-13: 60 mg/h via INTRAVENOUS
  Administered 2024-07-14: 30 mg/h via INTRAVENOUS
  Administered 2024-07-14: 60 mg/h via INTRAVENOUS
  Filled 2024-07-11 (×9): qty 200

## 2024-07-11 MED ORDER — CHLORHEXIDINE GLUCONATE CLOTH 2 % EX PADS
6.0000 | MEDICATED_PAD | Freq: Every day | CUTANEOUS | Status: DC
Start: 2024-07-11 — End: 2024-07-17
  Administered 2024-07-11 – 2024-07-17 (×7): 6 via TOPICAL

## 2024-07-11 NOTE — Progress Notes (Signed)
   PCCM transfer request    Sending physician: Dr Ricky   Sending facility: [facility]  Reason for transfer: ICU level of care needed, patient with rising lactate on milrinone for acute systolic CHFe  Brief case summary: 63 Y/O M with systolic CHF with EF 15% with worsening kidney function and concern for low output heart failure and rising lactate currently on milrinone for inotropic support.   Recommendations made prior to transfer: N/A  Transfer accepted: Yes    James [name] Herter 07/11/24 8:23 AM [facility]  For contact information, see Amion. If no response to pager, please call PCCM consult pager. After hours, 7PM- 7AM, please call Elink.

## 2024-07-11 NOTE — Progress Notes (Addendum)
 Hospitalist team reaching that patient is having decompensated heart failure and worsening  LVEF 15% Lactic acidosis trending up to 3.9  Patient needs to be transferred to [facility] for advanced heart failure evaluation and medication. Hold GDMT, Started IV milrinone 0.25 Continue diuretics   Transferred to [facility] to stepdown unit He will get evaluated by advanced heart failure and further recommendation to follow

## 2024-07-11 NOTE — Progress Notes (Signed)
 Concern for worsening low output heart failure with multiorgan failure developing with AKI, ALI, and lactic acidosis. I have called and spoken with the cardiology fellow Dr. Donnel.  -- Recommending for escalation to ICU here and ICU -> ICU transfer to [place] 2H.  -- Milronone gtt to be ordered by cardiology.  -- He has worsening AKI, may need central line rather than PICC if able.  -- Retimed lasix  for now, anticipate may need further escalation in dosing.  -- Foley cath for strict I/O.  -- Renal and liver US . -- Serial lactate.   Dorn Dawson, MD  [facility]

## 2024-07-11 NOTE — Plan of Care (Signed)

## 2024-07-11 NOTE — Consult Note (Signed)
 Advanced Heart Failure Team Consult Note   Primary Physician: Bevely Doffing, FNP Cardiologist:  Maude Emmer, MD  Reason for Consultation: CHF  HPI:    James [name] is seen today for evaluation of CHF/cardiogenic shock at the request of Dr. Alvan.   James [name] is a 63 y.o. male with nonischemic cardiomyopathy s/p ICD, HTN, T2DM, aortic root aneurysm. James [name] HF dates back to 2011 when he was diagnosed with nonischemic cardiomyopathy.  According to James [name] he came to the [facility] system in 2013 when he had a left heart cath with no coronary disease with a EF of 20%, he was started on low-dose GDMT at that time.  By 2013 his EF improved to 45%.  He followed yearly with Dr. Nishan until 2022 when he once again had a reduction in LVEF to 20 to 25%. He was then started on Entresto . Repeat heart cath demonstrated no significant coronary disease.     Since establishing care with us , James [name] has undergone evaluation for advanced therapies.  His CPX is suggestive of severe heart failure limitation and multiple right heart catheterizations demonstrate persistently reduced cardiac index less than 2 L/min/m.  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   Completed radiation for prostate cancer April 2025. He was also referred to Neurology for mass noted on CT of head, possible meningioma. He reports he was seen by neurology and was told that CT finding was felt to be benign (I am unable to locate note from specialist)   Echo 7/25 showed EF  25-30%, G3DD, RV mildly reduced, moderate MR.    He developed increased dyspnea over the last 3-4 days with abdominal distention.  He had been taking his meds.  He went to [facility] ER 9/25 and was noted to be in CHF.  Creatinine elevated initially to 1.78 from baseline around 1.3.  Diuresis was attempted but creatinine rose to 2.24 with elevated  lactate, trended 3.9 => 5.8.  He was started on milrinone 0.25 peripherally at [facility] this morning and was sent to [facility] CCU.  Currently, patient reports that he is comfortable with no dyspnea.  BP is stable.  PVCs noted on monitor.  Abdominal distention improved.  Echo this admission with EF 10-15%, mild-moderate RV dysfunction, moderate MR, IVC dilated.   Home Medications Prior to Admission medications   Medication Sig Start Date End Date Taking? Authorizing Provider  acetaminophen  (TYLENOL ) 500 MG tablet Take 1,000 mg by mouth every 6 (six) hours as needed for moderate pain.   Yes [provider]  albuterol  (VENTOLIN  HFA) 108 (90 Base) MCG/ACT inhaler INHALE 2 PUFFS BY MOUTH FOUR TIMES DAILY AS NEEDED 08/21/23  Yes Melvenia Manus BRAVO, MD  apixaban  (ELIQUIS ) 5 MG TABS tablet Take 1 tablet (5 mg total) by mouth 2 (two) times daily. 12/13/23  Yes Waddell Danelle ORN, MD  aspirin  EC 81 MG tablet Take 81 mg by mouth daily. Swallow whole.   Yes [provider]  atorvastatin  (LIPITOR) 40 MG tablet Take 1 tablet (40 mg total) by mouth daily. 03/24/24  Yes Melvenia Manus BRAVO, MD  carvedilol  (COREG ) 3.125 MG tablet Take 1 tablet (3.125 mg total) by mouth 2 (two) times daily with a meal. 01/07/24  Yes Milford, Harlene HERO, FNP  ENTRESTO  97-103 MG TAKE 1 TABLET BY MOUTH TWICE DAILY 11/04/23  Yes Sabharwal, Aditya, DO  FARXIGA  10  MG TABS tablet TAKE 1 TABLET(10 MG) BY MOUTH DAILY BEFORE BREAKFAST 03/30/24  Yes Clegg, Amy D, NP  metFORMIN (GLUCOPHAGE) 500 MG tablet TAKE 1 TABLET BY MOUTH TWICE DAILY 05/04/24  Yes Bevely Doffing, FNP  spironolactone  (ALDACTONE ) 25 MG tablet Take 1 tablet (25 mg total) by mouth at bedtime. 01/07/24  Yes Milford, Harlene HERO, FNP  torsemide  (DEMADEX ) 20 MG tablet Take 3 tablets (60 mg total) by mouth daily. 05/26/24  Yes Marcine Caffie HERO, PA-C    Past Medical History: Past Medical History:  Diagnosis Date   CKD (chronic kidney disease) stage 2, GFR 60-89 ml/min    COPD (chronic  obstructive pulmonary disease) (HCC)    Diabetes mellitus type II, non insulin  dependent (HCC)    Dilated aortic root    Elevated PSA    Hypercholesteremia    Hypertension    NICM (nonischemic cardiomyopathy) (HCC)    NSVT (nonsustained ventricular tachycardia) (HCC)    Obstructive sleep apnea    Paroxysmal atrial fibrillation (HCC)    Pulmonary hypertension (HCC)    PVC's (premature ventricular contractions)    Tobacco abuse     Past Surgical History: Past Surgical History:  Procedure Laterality Date   CARDIAC CATHETERIZATION  10/15/2000   COLONOSCOPY N/A 03/22/2014   Procedure: COLONOSCOPY;  Surgeon: Margo LITTIE Haddock, MD;  Location: [facility] ENDO SUITE;  Service: Endoscopy;  Laterality: N/A;  11:15 AM   ICD IMPLANT N/A 08/21/2021   Procedure: ICD IMPLANT;  Surgeon: Waddell Danelle ORN, MD;  Location: [facility] INVASIVE CV LAB;  Service: Cardiovascular;  Laterality: N/A;   INGUINAL HERNIA REPAIR Right 06/24/2017   Procedure: HERNIA REPAIR INGUINAL ADULT WITH MESH;  Surgeon: Mavis Anes, MD;  Location: [facility] ORS;  Service: General;  Laterality: Right;   PROSTATE BIOPSY     RIGHT HEART CATH N/A 11/13/2022   Procedure: RIGHT HEART CATH;  Surgeon: Gardenia Led, DO;  Location: [facility] INVASIVE CV LAB;  Service: Cardiovascular;  Laterality: N/A;   RIGHT HEART CATH N/A 12/21/2022   Procedure: RIGHT HEART CATH;  Surgeon: Gardenia Led, DO;  Location: [facility] INVASIVE CV LAB;  Service: Cardiovascular;  Laterality: N/A;   RIGHT HEART CATH N/A 11/25/2023   Procedure: RIGHT HEART CATH;  Surgeon: Gardenia Led, DO;  Location: [facility] INVASIVE CV LAB;  Service: Cardiovascular;  Laterality: N/A;   RIGHT/LEFT HEART CATH AND CORONARY ANGIOGRAPHY N/A 07/19/2021   Procedure: RIGHT/LEFT HEART CATH AND CORONARY ANGIOGRAPHY;  Surgeon: Verlin Lonni BIRCH, MD;  Location: [facility] INVASIVE CV LAB;  Service: Cardiovascular;  Laterality: N/A;   TEE WITHOUT CARDIOVERSION N/A 12/21/2022   Procedure: TRANSESOPHAGEAL ECHOCARDIOGRAM  (TEE);  Surgeon: Gardenia Led, DO;  Location: [facility] ENDOSCOPY;  Service: Cardiovascular;  Laterality: N/A;    Family History: Family History  Problem Relation Age of Onset   Coronary artery disease Mother    Diabetes Father    Heart attack Father    Hypertension Father    Iron deficiency Father    Thyroid  disease Sister    Cardiomyopathy Brother        No significant coronary disease by coronary angiography   Heart disease Maternal Grandfather        Also paternal grandfather   Sudden death Other    Colon cancer Neg Hx     Social History: Social History   Socioeconomic History   Marital status: Married    Spouse name: Larson Limones   Number of children: 4   Years of education: Not on file   Highest education  level: Associate degree: occupational, Scientist, product/process development, or vocational program  Occupational History   Occupation: Merchandiser, retail    Comment: [employer]  Tobacco Use   Smoking status: Former    Current packs/day: 0.00    Average packs/day: 0.5 packs/day for 38.8 years (19.4 ttl pk-yrs)    Types: Cigarettes    Start date: 10/15/1982    Quit date: 07/18/2021    Years since quitting: 2.9   Smokeless tobacco: Never   Tobacco comments:    3 cigaretts a day  Vaping Use   Vaping status: Never Used  Substance and Sexual Activity   Alcohol use: No    Alcohol/week: 0.0 standard drinks of alcohol   Drug use: No   Sexual activity: Yes    Birth control/protection: None  Other Topics Concern   Not on file  Social History Narrative   Divorced   No regular exercise   Social Drivers of Health   Financial Resource Strain: Low Risk  (07/21/2021)   Overall Financial Resource Strain (CARDIA)    Difficulty of Paying Living Expenses: Not very hard  Food Insecurity: No Food Insecurity (07/09/2024)   Hunger Vital Sign    Worried About Running Out of Food in the Last Year: Never true    Ran Out of Food in the Last Year: Never true  Transportation Needs: No Transportation Needs  (07/09/2024)   PRAPARE - Administrator, Civil Service (Medical): No    Lack of Transportation (Non-Medical): No  Physical Activity: Not on file  Stress: Not on file  Social Connections: Not on file    Allergies:  No Known Allergies  Objective:    Vital Signs:   Temp:  [97.5 F (36.4 C)-98.2 F (36.8 C)] 97.5 F (36.4 C) (09/27 0801) Pulse Rate:  [46-101] 101 (09/27 1100) Resp:  [15-24] 15 (09/27 1100) BP: (106-140)/(73-103) 115/103 (09/27 1100) SpO2:  [90 %-100 %] 97 % (09/27 1100) Weight:  [117.3 kg-117.5 kg] 117.3 kg (09/27 0801) Last BM Date : 07/11/24  Weight change: Filed Weights   07/10/24 0622 07/11/24 0521 07/11/24 0801  Weight: 117.6 kg 117.5 kg 117.3 kg    Intake/Output:   Intake/Output Summary (Last 24 hours) at 07/11/2024 1136 Last data filed at 07/11/2024 0933 Gross per 24 hour  Intake 723 ml  Output 400 ml  Net 323 ml      Physical Exam    General:  Well appearing. No resp difficulty HEENT: normal Neck: supple. JVP >10 cm. Carotids 2+ bilat; no bruits. No lymphadenopathy or thyromegaly appreciated. Cor: PMI lateral. Regular rate & rhythm. No rubs, gallops or murmurs. Lungs: clear Abdomen: soft, nontender, nondistended. No hepatosplenomegaly. No bruits or masses. Good bowel sounds. Extremities: no cyanosis, clubbing, rash, edema Neuro: alert & orientedx3, cranial nerves grossly intact. moves all 4 extremities w/o difficulty. Affect pleasant   Telemetry   NSR with PVCs (personally reviewed)  EKG    NSR with PVCs (personally reviewed)  Labs   Basic Metabolic Panel: Recent Labs  Lab 07/09/24 1649 07/09/24 1831 07/10/24 0450 07/11/24 0455 07/11/24 0928  NA 137  --  137 137  --   K 3.0*  --  3.8 4.2  --   CL 97*  --  101 100  --   CO2 25  --  23 22  --   GLUCOSE 195*  --  191* 168*  --   BUN 31*  --  34* 48*  --   CREATININE 1.78*  --  1.78* 2.24*  --  CALCIUM  9.4  --  8.9 8.9  --   MG  --  2.1 2.7*  --  2.8*  PHOS   --   --  3.9  --   --     Liver Function Tests: Recent Labs  Lab 07/09/24 1649 07/11/24 0455  AST 30 590*  ALT 42 905*  ALKPHOS 113 137*  BILITOT 1.6* 4.6*  PROT 7.7 7.3  ALBUMIN 4.2 3.9   No results for input(s): LIPASE, AMYLASE in the last 168 hours. No results for input(s): AMMONIA in the last 168 hours.  CBC: Recent Labs  Lab 07/09/24 1649 07/10/24 0450 07/11/24 0714  WBC 7.9 11.0* 12.5*  NEUTROABS 6.1  --  10.4*  HGB 15.2 15.1 15.7  HCT 45.5 45.0 48.1  MCV 96.4 95.3 98.6  PLT 217 210 146*    Cardiac Enzymes: No results for input(s): CKTOTAL, CKMB, CKMBINDEX, TROPONINI in the last 168 hours.  BNP: BNP (last 3 results) Recent Labs    06/03/24 0933 06/10/24 1213 07/09/24 1649  BNP 795.1* 1,071.5* 3,316.0*    ProBNP (last 3 results) No results for input(s): PROBNP in the last 8760 hours.   CBG: Recent Labs  Lab 07/10/24 1117 07/10/24 1557 07/10/24 2005 07/11/24 0811 07/11/24 1114  GLUCAP 187* 185* 199* 146* 207*    Coagulation Studies: No results for input(s): LABPROT, INR in the last 72 hours.   Imaging   US  EKG SITE RITE Result Date: 07/11/2024 If Site Rite image not attached, placement could not be confirmed due to current cardiac rhythm.  ECHOCARDIOGRAM LIMITED Result Date: 07/10/2024    ECHOCARDIOGRAM LIMITED REPORT   Patient Name:   MANOJ ENRIQUEZ Date of Exam: 07/10/2024 Medical Rec #:  [id number]         Height:       78.0 in Accession #:    [id number]        Weight:       259.3 lb Date of Birth:  04/28/1961         BSA:          2.521 m Patient Age:    63 years          BP:           124/83 mmHg Patient Gender: M                 HR:           65 bpm. Exam Location:  [facility] Procedure: Intracardiac Opacification Agent, Cardiac Doppler, Limited Color            Doppler and Limited Echo (Both Spectral and Color Flow Doppler were            utilized during procedure). Indications:    Congestive Heart Failure I50.9   History:        Patient has prior history of Echocardiogram examinations, most                 recent 05/14/2024.  Sonographer:    Koleen Popper RDCS Referring Phys: [id number] DORN DAWSON  Sonographer Comments: Image acquisition challenging due to patient body habitus. IMPRESSIONS  1. Left ventricular ejection fraction, by estimation, is 10-15%. The left ventricle demonstrates global hypokinesis. The left ventricular internal cavity size was severely dilated. Left ventricular diastolic parameters are indeterminate.  2. Right ventricular systolic function mild to moderately reduced. The right ventricular size is mildly enlarged. There is moderately elevated pulmonary artery systolic pressure.  3. Moderate mitral  valve regurgitation. No evidence of mitral stenosis.  4. The tricuspid valve is abnormal. Tricuspid valve regurgitation is moderate.  5. The inferior vena cava is dilated in size with <50% respiratory variability, suggesting right atrial pressure of 15 mmHg.  6. Limited echo evaluate LV and RV function. FINDINGS  Left Ventricle: Left ventricular ejection fraction, by estimation, is 10-15%. The left ventricle demonstrates global hypokinesis. Definity  contrast agent was given IV to delineate the left ventricular endocardial borders. The left ventricular internal cavity size was severely dilated. Left ventricular diastolic parameters are indeterminate. Right Ventricle: The right ventricular size is mildly enlarged. Right vetricular wall thickness was not well visualized. Right ventricular systolic function mild to moderately reduced. There is moderately elevated pulmonary artery systolic pressure. The tricuspid regurgitant velocity is 3.21 m/s, and with an assumed right atrial pressure of 15 mmHg, the estimated right ventricular systolic pressure is 56.2 mmHg. Mitral Valve: Moderate mitral valve regurgitation. No evidence of mitral valve stenosis. Tricuspid Valve: The tricuspid valve is abnormal. Tricuspid  valve regurgitation is moderate . No evidence of tricuspid stenosis. Aortic Valve: Aortic valve mean gradient measures 1.9 mmHg. Aortic valve peak gradient measures 3.2 mmHg. Aortic valve area, by VTI measures 2.72 cm. Venous: The inferior vena cava is dilated in size with less than 50% respiratory variability, suggesting right atrial pressure of 15 mmHg. Additional Comments: Spectral Doppler performed. Color Doppler performed.  LEFT VENTRICLE PLAX 2D LVIDd:         7.10 cm LVIDs:         6.90 cm LV PW:         0.80 cm LV IVS:        0.80 cm LVOT diam:     2.60 cm LV SV:         37 LV SV Index:   15 LVOT Area:     5.31 cm  RIGHT VENTRICLE         IVC TAPSE (M-mode): 1.2 cm  IVC diam: 3.80 cm LEFT ATRIUM              Index LA diam:        5.10 cm  2.02 cm/m LA Vol (A2C):   132.0 ml 52.37 ml/m LA Vol (A4C):   106.0 ml 42.05 ml/m LA Biplane Vol: 123.0 ml 48.79 ml/m  AORTIC VALVE AV Area (Vmax):    3.10 cm AV Area (Vmean):   2.52 cm AV Area (VTI):     2.72 cm AV Vmax:           89.84 cm/s AV Vmean:          67.270 cm/s AV VTI:            0.137 m AV Peak Grad:      3.2 mmHg AV Mean Grad:      1.9 mmHg LVOT Vmax:         52.40 cm/s LVOT Vmean:        31.900 cm/s LVOT VTI:          0.070 m LVOT/AV VTI ratio: 0.51  AORTA Ao Root diam: 4.60 cm MR Peak grad: 74.6 mmHg   TRICUSPID VALVE MR Vmax:      432.00 cm/s TR Peak grad:   41.2 mmHg                           TR Vmax:        321.00 cm/s  SHUNTS                           Systemic VTI:  0.07 m                           Systemic Diam: 2.60 cm Dorn Ross MD Electronically signed by Dorn Ross MD Signature Date/Time: 07/10/2024/4:18:20 PM    Final      Medications:     Current Medications:  apixaban   5 mg Oral BID   aspirin  EC  81 mg Oral Daily   atorvastatin   40 mg Oral Daily   Chlorhexidine  Gluconate Cloth  6 each Topical Daily   dapagliflozin  propanediol  10 mg Oral Daily   furosemide   60 mg Intravenous BID   insulin   aspart  0-6 Units Subcutaneous TID WC   sodium chloride  flush  3 mL Intravenous Q12H    Infusions:  amiodarone     furosemide  (LASIX ) 200 mg in dextrose  5 % 100 mL (2 mg/mL) infusion     milrinone 0.25 mcg/kg/min (07/11/24 0810)      Assessment/Plan   1. Acute on chronic systolic CHF/Cardiogenic shock: Long history of nonischemic cardiomyopathy. Has Biotronik ICD.  History of low output on RHC and poor CPX but with minimal symptoms in the past.  Considering advanced therapies, has preferred transplant.  No smoking since 09/15/23.  Has controlled prostate cancer.  He was admitted to [facility] with volume overload and signs of low output HF AKI on CKD stage 3 and elevated lactate.  Milrinone 0.25 begun this morning. He looks volume overloaded on exam.  Stable BP, feels comfortable.  Creatinine 2.24 today, lactate 5.8 this morning.  - Place CVL in unit today, follow CVP and co-ox.  - Started milrinone 0.25 - Had Lasix  bolus already with urine in foley bag.  Will start Lasix  gtt 10 mg/hr and adjust based on output.  - He is on dapagliflozin  - Entresto , spironolactone  on hold with AKI.  - Beta blocker on hold with low output/CGS.  - Will need ongoing workup for advanced therapies.  2. AKI on CKD stage 3: Creatinine 2.24.  Suspect cardiorenal.  Started on milrinone 0.25.  3. PVCs/NSVT: Frequent on milrinone.  - Start amiodarone gtt 30 mg/hr to control.  4. Atrial fibrillation: Paroxysmal. NSR currently.  - Continue Eliquis .  Does not need aspirin .  5. Prostate cancer: Treated.  6. Brain tumor: ?Meningioma.  Ongoing workup.   CRITICAL CARE Performed by: Ezra Shuck  Total critical care time: 70 minutes  Critical care time was exclusive of separately billable procedures and treating other patients.  Critical care was necessary to treat or prevent imminent or life-threatening deterioration.  Critical care was time spent personally by me on the following activities: development of treatment  plan with patient and/or surrogate as well as nursing, discussions with consultants, evaluation of patient's response to treatment, examination of patient, obtaining history from patient or surrogate, ordering and performing treatments and interventions, ordering and review of laboratory studies, ordering and review of radiographic studies, pulse oximetry and re-evaluation of patient's condition.   Length of Stay: 2  Ezra Shuck, MD  07/11/2024, 11:36 AM    Advanced Heart Failure Team Pager [phone] (M-F; 7a - 5p)  Please contact [facility] for night-coverage after hours (4p -7a ) and weekends on [email or web address].

## 2024-07-11 NOTE — H&P (Signed)
 NAME:  James [name], MRN:  [id number], DOB:  Jan 07, 1961, LOS: 2 ADMISSION DATE:  07/09/2024, CONSULTATION DATE:  07/11/2024 REFERRING MD:  TRH, CHIEF COMPLAINT: Cardiogenic shock  History of Present Illness:  James [name] is a 63 year old male with a past medical history significant for nonischemic cardiomyopathy, HFrEF with biventricular failure s/p ICD, pulmonary hypertension, paroxysmal A-fib/flutter on anticoagulation, prior NSVT, aortic root aneurysm, CKD stage III AA, HTN, HLD, diabetes, OSA, former smoker, prostate cancer s/p treatment and brain mass questionable for meningioma who presented to the ED at [facility] 9/25 for complaints of dyspnea on exertion and worsening edema that began 3 to 4 days prior to admission.  Workup on admission revealed acute on chronic biventricular heart failure prompting admission to hospitalist for further management and workup.  Overnight 9/27 patient was observed with worsening AKI, acute liver injury, and increasing lactic acidosis concerning for evolving cardiogenic shock prompting consult to PCCM and transferred to [facility] for further care  Pertinent  Medical History  Nonischemic cardiomyopathy, HFrEF with biventricular failure s/p ICD, pulmonary hypertension, paroxysmal A-fib/flutter on anticoagulation, prior NSVT, aortic root aneurysm, CKD stage III AA, HTN, HLD, diabetes, OSA, former smoker, prostate cancer s/p treatment and brain mass questionable for meningioma   Significant Hospital Events: Including procedures, antibiotic start and stop dates in addition to other pertinent events   9/25 [facility] to [facility] ED for dyspnea on exertion and worsening edema found to be in acute heart failure exacerbation 9/27 worsening AKI with shock liver and lactic acidosis indicating evolution of cardiogenic shock transferred to [facility] for further care.  Started on milrinone drip  Interim History / Subjective:  Seen sitting up in bed with no  acute complaints  Objective    Blood pressure 129/83, pulse 99, temperature (!) 97.5 F (36.4 C), temperature source Oral, resp. rate (!) 24, height 6' 6 (1.981 m), weight 117.3 kg, SpO2 90%.        Intake/Output Summary (Last 24 hours) at 07/11/2024 0901 Last data filed at 07/11/2024 9362 Gross per 24 hour  Intake 723 ml  Output 700 ml  Net 23 ml   Filed Weights   07/10/24 0622 07/11/24 0521 07/11/24 0801  Weight: 117.6 kg 117.5 kg 117.3 kg    Examination: General: Acute ill-appearing middle-age male sitting up in bed in no acute distress HEENT: [place]/AT, MM pink/moist, PERRL,  Neuro: Alert and oriented x 3, nonfocal CV: Frequent runs of NSVT s1s2 regular rate and rhythm, no murmur, rubs, or gallops,  PULM: No clear to auscultation bilaterally, no increased work of breathing, on room air GI: soft, bowel sounds active in all 4 quadrants, non-tender, non-distended, tolerating diet Extremities: warm/dry, no edema  Skin: no rashes or lesions  Resolved problem list   Assessment and Plan  Cardiogenic shock  - Overnight 9/27 patient was seen with worsening AKI, development of acute liver injury and uptrending lactic acidosis concerning for development of cardiogenic shock in the setting of acute HF R EF exacerbation -Echocardiogram with global hypokinesis and decreased EF 10 to 15% with mild to moderately reduced RV function as well P: Transfer to [facility] further care Continue milrinone drip Cardiology following, appreciate assistance Trend HS Troponin  Follow lactic acid  Obtain central access and check COOX and CVP Monitor electrolytes  Close monitoring of volume status/diureses as needed  Continuous telemetry  Diuresing per cardiology  ICD in place with history of NSVT -Patient has Biotronik ICD in place P: Continuous telemetry Optimize  electrolytes with goal of K greater than 4 and mag greater than 2  History of paroxysmal atrial fibrillation on anticoagulation at  baseline P:  Continue p.o. Eliquis  Beta-blocker on hold given cardiogenic shock Continuous telemetry Electrolyte management as above  Pulmonary hypertension OSA Former smoker P: Supplemental oxygen  needed for sat goal greater than 92 Pulmonary hygiene Mobilize as able CPAP during periods of rest and at night  Acute kidney injury superimposed on CKD stage III AA - Baseline creatinine around 1.3, elevated to 2.24 with GFR 30 2 AM 9/27 P: Follow renal function  Monitor urine output Trend Bmet Avoid nephrotoxins Ensure adequate renal perfusion  Inotropic support as above  Essential hypertension P: Home beta-blocker on hold Continuous telemetry  Hyperlipidemia P; Continue aspirin  and statin  Prostate cancer P: Outpatient follow-up  Labs   CBC: Recent Labs  Lab 07/09/24 1649 07/10/24 0450 07/11/24 0714  WBC 7.9 11.0* 12.5*  NEUTROABS 6.1  --  10.4*  HGB 15.2 15.1 15.7  HCT 45.5 45.0 48.1  MCV 96.4 95.3 98.6  PLT 217 210 146*    Basic Metabolic Panel: Recent Labs  Lab 07/09/24 1649 07/09/24 1831 07/10/24 0450 07/11/24 0455  NA 137  --  137 137  K 3.0*  --  3.8 4.2  CL 97*  --  101 100  CO2 25  --  23 22  GLUCOSE 195*  --  191* 168*  BUN 31*  --  34* 48*  CREATININE 1.78*  --  1.78* 2.24*  CALCIUM  9.4  --  8.9 8.9  MG  --  2.1 2.7*  --   PHOS  --   --  3.9  --    GFR: Estimated Creatinine Clearance: 48.6 mL/min (A) (by C-G formula based on SCr of 2.24 mg/dL (H)). Recent Labs  Lab 07/09/24 1649 07/10/24 0450 07/11/24 0455 07/11/24 0714  WBC 7.9 11.0*  --  12.5*  LATICACIDVEN  --   --  3.9* 5.3*    Liver Function Tests: Recent Labs  Lab 07/09/24 1649 07/11/24 0455  AST 30 590*  ALT 42 905*  ALKPHOS 113 137*  BILITOT 1.6* 4.6*  PROT 7.7 7.3  ALBUMIN 4.2 3.9   No results for input(s): LIPASE, AMYLASE in the last 168 hours. No results for input(s): AMMONIA in the last 168 hours.  ABG    Component Value Date/Time   PHART  7.377 07/19/2021 1305   PCO2ART 40.4 07/19/2021 1305   PO2ART 70 (L) 07/19/2021 1305   HCO3 27.3 11/25/2023 0958   HCO3 27.4 11/25/2023 0958   TCO2 29 02/26/2024 1753   ACIDBASEDEF 1.0 12/21/2022 1037   ACIDBASEDEF 1.0 12/21/2022 1037   O2SAT 47.4 07/11/2024 0714     Coagulation Profile: No results for input(s): INR, PROTIME in the last 168 hours.  Cardiac Enzymes: No results for input(s): CKTOTAL, CKMB, CKMBINDEX, TROPONINI in the last 168 hours.  HbA1C: HB A1C (BAYER DCA - WAIVED)  Date/Time Value Ref Range Status  06/13/2023 12:00 AM 6.3 (H) 4.8 - 5.6 % Final    Comment:             Prediabetes: 5.7 - 6.4          Diabetes: >6.4          Glycemic control for adults with diabetes: <7.0    Hgb A1c MFr Bld  Date/Time Value Ref Range Status  07/10/2024 04:50 AM 6.0 (H) 4.8 - 5.6 % Final    Comment:    (NOTE)  Diagnosis of Diabetes The following HbA1c ranges recommended by the American Diabetes Association (ADA) may be used as an aid in the diagnosis of diabetes mellitus.  Hemoglobin             Suggested A1C NGSP%              Diagnosis  <5.7                   Non Diabetic  5.7-6.4                Pre-Diabetic  >6.4                   Diabetic  <7.0                   Glycemic control for                       adults with diabetes.    03/24/2024 02:18 PM 6.1 (H) 4.8 - 5.6 % Final    Comment:             Prediabetes: 5.7 - 6.4          Diabetes: >6.4          Glycemic control for adults with diabetes: <7.0     CBG: Recent Labs  Lab 07/10/24 0712 07/10/24 1117 07/10/24 1557 07/10/24 2005 07/11/24 0811  GLUCAP 163* 187* 185* 199* 146*    Review of Systems:   Please see the history of present illness. All other systems reviewed and are negative    Past Medical History:  He,  has a past medical history of CKD (chronic kidney disease) stage 2, GFR 60-89 ml/min, COPD (chronic obstructive pulmonary disease) (HCC), Diabetes mellitus type II, non  insulin  dependent (HCC), Dilated aortic root, Elevated PSA, Hypercholesteremia, Hypertension, NICM (nonischemic cardiomyopathy) (HCC), NSVT (nonsustained ventricular tachycardia) (HCC), Obstructive sleep apnea, Paroxysmal atrial fibrillation (HCC), Pulmonary hypertension (HCC), PVC's (premature ventricular contractions), and Tobacco abuse.   Surgical History:   Past Surgical History:  Procedure Laterality Date   CARDIAC CATHETERIZATION  10/15/2000   COLONOSCOPY N/A 03/22/2014   Procedure: COLONOSCOPY;  Surgeon: Margo LITTIE Haddock, MD;  Location: [facility] ENDO SUITE;  Service: Endoscopy;  Laterality: N/A;  11:15 AM   ICD IMPLANT N/A 08/21/2021   Procedure: ICD IMPLANT;  Surgeon: Waddell Danelle ORN, MD;  Location: [facility] INVASIVE CV LAB;  Service: Cardiovascular;  Laterality: N/A;   INGUINAL HERNIA REPAIR Right 06/24/2017   Procedure: HERNIA REPAIR INGUINAL ADULT WITH MESH;  Surgeon: Mavis Anes, MD;  Location: [facility] ORS;  Service: General;  Laterality: Right;   PROSTATE BIOPSY     RIGHT HEART CATH N/A 11/13/2022   Procedure: RIGHT HEART CATH;  Surgeon: Gardenia Led, DO;  Location: [facility] INVASIVE CV LAB;  Service: Cardiovascular;  Laterality: N/A;   RIGHT HEART CATH N/A 12/21/2022   Procedure: RIGHT HEART CATH;  Surgeon: Gardenia Led, DO;  Location: [facility] INVASIVE CV LAB;  Service: Cardiovascular;  Laterality: N/A;   RIGHT HEART CATH N/A 11/25/2023   Procedure: RIGHT HEART CATH;  Surgeon: Gardenia Led, DO;  Location: [facility] INVASIVE CV LAB;  Service: Cardiovascular;  Laterality: N/A;   RIGHT/LEFT HEART CATH AND CORONARY ANGIOGRAPHY N/A 07/19/2021   Procedure: RIGHT/LEFT HEART CATH AND CORONARY ANGIOGRAPHY;  Surgeon: Verlin Lonni BIRCH, MD;  Location: [facility] INVASIVE CV LAB;  Service: Cardiovascular;  Laterality: N/A;   TEE WITHOUT CARDIOVERSION N/A 12/21/2022   Procedure: TRANSESOPHAGEAL ECHOCARDIOGRAM (TEE);  Surgeon: Gardenia Led, DO;  Location: [facility] ENDOSCOPY;  Service: Cardiovascular;  Laterality:  N/A;     Social History:   reports that he quit smoking about 2 years ago. His smoking use included cigarettes. He started smoking about 41 years ago. He has a 19.4 pack-year smoking history. He has never used smokeless tobacco. He reports that he does not drink alcohol and does not use drugs.   Family History:  His family history includes Cardiomyopathy in his brother; Coronary artery disease in his mother; Diabetes in his father; Heart attack in his father; Heart disease in his maternal grandfather; Hypertension in his father; Iron deficiency in his father; Sudden death in an other family member; Thyroid  disease in his sister. There is no history of Colon cancer.   Allergies No Known Allergies   Home Medications  Prior to Admission medications   Medication Sig Start Date End Date Taking? Authorizing Provider  acetaminophen  (TYLENOL ) 500 MG tablet Take 1,000 mg by mouth every 6 (six) hours as needed for moderate pain.   Yes [provider]  albuterol  (VENTOLIN  HFA) 108 (90 Base) MCG/ACT inhaler INHALE 2 PUFFS BY MOUTH FOUR TIMES DAILY AS NEEDED 08/21/23  Yes Melvenia Manus BRAVO, MD  apixaban  (ELIQUIS ) 5 MG TABS tablet Take 1 tablet (5 mg total) by mouth 2 (two) times daily. 12/13/23  Yes Waddell Danelle ORN, MD  aspirin  EC 81 MG tablet Take 81 mg by mouth daily. Swallow whole.   Yes [provider]  atorvastatin  (LIPITOR) 40 MG tablet Take 1 tablet (40 mg total) by mouth daily. 03/24/24  Yes Melvenia Manus BRAVO, MD  carvedilol  (COREG ) 3.125 MG tablet Take 1 tablet (3.125 mg total) by mouth 2 (two) times daily with a meal. 01/07/24  Yes Milford, Jessica M, FNP  ENTRESTO  97-103 MG TAKE 1 TABLET BY MOUTH TWICE DAILY 11/04/23  Yes Sabharwal, Aditya, DO  FARXIGA  10 MG TABS tablet TAKE 1 TABLET(10 MG) BY MOUTH DAILY BEFORE BREAKFAST 03/30/24  Yes Clegg, Amy D, NP  metFORMIN (GLUCOPHAGE) 500 MG tablet TAKE 1 TABLET BY MOUTH TWICE DAILY 05/04/24  Yes Bevely Doffing, FNP  spironolactone  (ALDACTONE )  25 MG tablet Take 1 tablet (25 mg total) by mouth at bedtime. 01/07/24  Yes Milford, Harlene HERO, FNP  torsemide  (DEMADEX ) 20 MG tablet Take 3 tablets (60 mg total) by mouth daily. 05/26/24  Yes Marcine Caffie HERO, PA-C     Critical care time:   CRITICAL CARE Performed by: Demetrio Leighty D. Harris   Total critical care time: 45 minutes  Critical care time was exclusive of separately billable procedures and treating other patients.  Critical care was necessary to treat or prevent imminent or life-threatening deterioration.  Critical care was time spent personally by me on the following activities: development of treatment plan with patient and/or surrogate as well as nursing, discussions with consultants, evaluation of patient's response to treatment, examination of patient, obtaining history from patient or surrogate, ordering and performing treatments and interventions, ordering and review of laboratory studies, ordering and review of radiographic studies, pulse oximetry and re-evaluation of patient's condition.  Jeanpierre Thebeau D. Harris, NP-C [facility] Personal contact information can be found on Amion  If no contact or response made please call [phone] 07/11/2024, 11:38 AM

## 2024-07-11 NOTE — Progress Notes (Signed)
 Provider notified of lactic acid of 3.9

## 2024-07-11 NOTE — Progress Notes (Signed)
 Date and time results received: 07/11/24 0800 (use smartphrase .now to insert current time)  Test: Lactic Acid Critical Value: 5.3  Name of Provider Notified: Dr. Ricky  Orders Received? Or Actions Taken?: Orders Received - See Orders for details Delon RN  made aware.

## 2024-07-11 NOTE — Progress Notes (Signed)
 Progress Note   Patient: James [name] FMW:[id number] DOB: 27-Sep-1961 DOA: 07/09/2024     2 DOS: the patient was seen and examined on 07/11/2024   Brief hospital admission narrative: As per H&P written by Dr. Keturah on 07/09/2024 James [name] is a 63 y.o. male with hx of NICM, HFrEF with BiV systolic failure, s/p ICD, under evaluation for heart transplant / LVAD, pulm HTN, paroxsymal Afib/flutter on AC, NSVT, aortic root aneurysm, CKD stage 3a, HTN, HLD, DM2, OSA, former smoker, prostate CA s/p EBRT on ADT, brain mass ? Meningioma has been referred to NSGY, who presented with DOE and worsening edema. Reports that symptoms have worsened over past 3-4 days, but particularly worse today. Notes increased abdominal distention mainly and lower extremity swelling.  His functional status typically he is able to go about 100 yards per his report or out of the parking lot.  Today he was unable to go from the waiting room to the hospital room without having to stop.  Denies any exertional chest pain.  No recent fever, chills, cough/cold.  Has been taking torsemide  60 mg daily.  He was weighing himself daily but sounds like that he has recently stopped doing this.  Notes a slow increase in weights from around 250 pound to 275 pounds over the past 5 months.  Still has a scale at home.  Denies any recent change in diet.   Assessment and plan 1-acute on chronic biventricular systolic heart failure - Patient with underlying history of nonischemic cardiomyopathy, ICD in place - No requiring oxygen  supplementation at rest; but reporting mild orthopnea and short winded sensation with activity. - Patient reports baseline weight around 250 and is currently 275 pounds in approximately over 62-month. -now demonstrating low cardiac output syndrome with associated elevated lactic acid and transaminitis. -per cardiology rec's will transfer to ICU at [facility] and start milrinone  -further GDMT to be decided by cards.    2-elevated troponin/elevated LFT's - Flat elevation in troponin most likely in the setting of demand ischemia from heart failure. -also lack of proper perfusion with low cardiac output responsible for elevated LFT's -To be thorough liver ultrasound has been ordered -Continue treatment for heart failure as mentioned above and follow LFTs trend - Minimize the use of hepatotoxic agents - Statins will be on hold. - Patient reports no chest pain. - Continue aspirin    3-history of paroxysmal atrial fibrillation - Continue carvedilol  for rate control and continue Eliquis . - Continue telemetry monitoring and follow electrolytes with a goal for potassium above 4 and magnesium  above 2. - Telemetry demonstrating nonsustained PVCs.  4-acute kidney injury on chronic kidney disease stage IIIa - Patient creatinine around 1.3 at baseline; on presentation up to 1.78 and now 2.24 - Most likely decreased perfusion in the setting of heart failure - Continue to closely follow renal function trend in the setting of acute diuresis - Minimize the use of contrast and nephrotoxic agents. -renal US  requested.  5-hypertension - Continue current antihypertensive agents and follow vital signs.  6-hyperlipidemia - planning to resume statin once LFT's stabilize - Heart healthy diet discussed with patient.  7-history of prostate cancer -Continue patient follow-up with urology service.  Subjective:  No chest pain, no nausea, no vomiting.  Expressing feeling short of breath and having mild orthopnea.  Patient is afebrile.  Has noted decrease in his urine output.  Physical Exam: Vitals:   07/11/24 0531 07/11/24 0801 07/11/24 0828 07/11/24 0830  BP: 106/84  (!) 140/98 129/83  Pulse: 69  (!) 46 99  Resp: 18  20 (!) 24  Temp: (!) 97.5 F (36.4 C) (!) 97.5 F (36.4 C)    TempSrc: Oral Oral    SpO2: 100%  96% 90%  Weight:  117.3 kg    Height:  6' 6 (1.981 m)     General exam: Alert, awake, oriented x 3;  in no major distress. Respiratory system: Fine crackles at the bases; no using accessory muscles.  Good saturation on room air. Cardiovascular system: Rate controlled, no rubs, no gallops, positive JVD on exam. Gastrointestinal system: Abdomen is mildly distended, nontender, positive bowel sounds.  No guarding Central nervous system: No focal neurological deficits. Extremities: No cyanosis or clubbing; trace edema appreciated bilaterally. Skin: No petechiae. Psychiatry: Judgement and insight appear normal. Mood & affect appropriate.    Latest data Reviewed: Comprehensive metabolic panel: Sodium 137, potassium 4.2, chloride 100, bicarb 22, BUN 48, creatinine 2.24, AST 519, ALT 905, alk phos 137, total bilirubin 4.6 and GFR 32 Lactic acid: 3.9 >> 5.3 >> 5.8 CBC: WBCs 11.0, hemoglobin 15.1 and platelet count 210K Magnesium : 2.8 TSH: 1.794  Family Communication: No family at bedside.  Disposition: Status is: Inpatient Remains inpatient appropriate because: Continue IV diuresis  Patient will be transferred to [facility] for further evaluation and management by advanced heart failure team; there is high concerns for low output heart failure and further decompensation in his ejection fraction as demonstrated on 2D echo.  Patient today also demonstrating slightly worsening renal function, transaminitis and elevated lactic acid.  Time spent: 50 minutes  Author: Eric Nunnery, MD 07/11/2024 8:38 AM  For on call review [email or web address].

## 2024-07-11 NOTE — Progress Notes (Signed)
 There was order for placing the PICC. Secure chatted with patient's RN regarding PICC placement. Kevan RN ( patient's RN) said that they were going to place CVC line. Informed Elizabath RN to cancel the PICC order. HS [employer]

## 2024-07-12 DIAGNOSIS — R570 Cardiogenic shock: Secondary | ICD-10-CM | POA: Diagnosis not present

## 2024-07-12 LAB — BLOOD CULTURE ID PANEL (REFLEXED) - BCID2

## 2024-07-12 LAB — GLUCOSE, CAPILLARY
Glucose-Capillary: 152 mg/dL — ABNORMAL HIGH (ref 70–99)
Glucose-Capillary: 186 mg/dL — ABNORMAL HIGH (ref 70–99)
Glucose-Capillary: 158 mg/dL — ABNORMAL HIGH (ref 70–99)
Glucose-Capillary: 176 mg/dL — ABNORMAL HIGH (ref 70–99)

## 2024-07-12 LAB — COMPREHENSIVE METABOLIC PANEL WITH GFR
ALT: 1295 U/L — ABNORMAL HIGH (ref 0–44)
AST: 585 U/L — ABNORMAL HIGH (ref 15–41)
Albumin: 3.5 g/dL (ref 3.5–5.0)
Alkaline Phosphatase: 130 U/L — ABNORMAL HIGH (ref 38–126)
Anion gap: 9 (ref 5–15)
BUN: 48 mg/dL — ABNORMAL HIGH (ref 8–23)
CO2: 27 mmol/L (ref 22–32)
Calcium: 8.3 mg/dL — ABNORMAL LOW (ref 8.9–10.3)
Chloride: 101 mmol/L (ref 98–111)
Creatinine, Ser: 2.17 mg/dL — ABNORMAL HIGH (ref 0.61–1.24)
GFR, Estimated: 33 mL/min — ABNORMAL LOW (ref >60)
Glucose, Bld: 164 mg/dL — ABNORMAL HIGH (ref 70–99)
Potassium: 3.4 mmol/L — ABNORMAL LOW (ref 3.5–5.1)
Sodium: 137 mmol/L (ref 135–145)
Total Bilirubin: 2.8 mg/dL — ABNORMAL HIGH (ref 0.0–1.2)
Total Protein: 6.6 g/dL (ref 6.5–8.1)

## 2024-07-12 LAB — BASIC METABOLIC PANEL WITH GFR
Anion gap: 13 (ref 5–15)
BUN: 43 mg/dL — ABNORMAL HIGH (ref 8–23)
CO2: 30 mmol/L (ref 22–32)
Calcium: 8.8 mg/dL — ABNORMAL LOW (ref 8.9–10.3)
Chloride: 95 mmol/L — ABNORMAL LOW (ref 98–111)
Creatinine, Ser: 1.88 mg/dL — ABNORMAL HIGH (ref 0.61–1.24)
GFR, Estimated: 40 mL/min — ABNORMAL LOW (ref >60)
Glucose, Bld: 157 mg/dL — ABNORMAL HIGH (ref 70–99)
Potassium: 3.5 mmol/L (ref 3.5–5.1)
Sodium: 138 mmol/L (ref 135–145)

## 2024-07-12 LAB — LACTIC ACID, PLASMA: Lactic Acid, Venous: 1.8 mmol/L (ref 0.5–1.9)

## 2024-07-12 LAB — MAGNESIUM: Magnesium: 2.3 mg/dL (ref 1.7–2.4)

## 2024-07-12 LAB — COOXEMETRY PANEL
Carboxyhemoglobin: 1.6 % — ABNORMAL HIGH (ref 0.5–1.5)
Methemoglobin: <0.7 % (ref 0.0–1.5)
O2 Saturation: 58.7 %
Total hemoglobin: 14.5 g/dL (ref 12.0–16.0)

## 2024-07-12 MED ORDER — POTASSIUM CHLORIDE CRYS ER 20 MEQ PO TBCR
40.0000 meq | EXTENDED_RELEASE_TABLET | ORAL | Status: AC
  Administered 2024-07-12 (×2): 40 meq via ORAL
  Filled 2024-07-12 (×2): qty 2

## 2024-07-12 MED ORDER — POTASSIUM CHLORIDE CRYS ER 20 MEQ PO TBCR
20.0000 meq | EXTENDED_RELEASE_TABLET | Freq: Every day | ORAL | Status: DC
  Administered 2024-07-12: 20 meq via ORAL
  Filled 2024-07-12: qty 1

## 2024-07-12 MED ORDER — METOLAZONE 5 MG PO TABS
5.0000 mg | ORAL_TABLET | Freq: Once | ORAL | Status: AC
  Administered 2024-07-12: 5 mg via ORAL
  Filled 2024-07-12: qty 1

## 2024-07-12 MED ORDER — POTASSIUM CHLORIDE CRYS ER 20 MEQ PO TBCR
40.0000 meq | EXTENDED_RELEASE_TABLET | Freq: Once | ORAL | Status: AC
  Administered 2024-07-12: 40 meq via ORAL
  Filled 2024-07-12: qty 2

## 2024-07-12 NOTE — Progress Notes (Signed)
   07/12/24 2257  BiPAP/CPAP/SIPAP  BiPAP/CPAP/SIPAP Pt Type Adult  BiPAP/CPAP/SIPAP Resmed  Mask Type Full face mask  Mask Size Medium  Respiratory Rate 16 breaths/min  EPAP 5 cmH2O  FiO2 (%) 21 %  Patient Home Machine No  Patient Home Mask No  Patient Home Tubing No  Auto Titrate No  Device Plugged into RED Power Outlet Yes  BiPAP/CPAP /SiPAP Vitals  Resp 16  SpO2 95 %

## 2024-07-12 NOTE — Plan of Care (Signed)

## 2024-07-12 NOTE — Plan of Care (Signed)
   Problem: Activity: Goal: Risk for activity intolerance will decrease Outcome: Progressing   Problem: Nutrition: Goal: Adequate nutrition will be maintained Outcome: Progressing

## 2024-07-12 NOTE — Progress Notes (Signed)
 Patient ID: James [name], male   DOB: 03/16/1961, 63 y.o.   MRN: [id number]     Advanced Heart Failure Rounding Note  Cardiologist: Maude Emmer, MD  Chief Complaint: CHF Subjective:    Feels better today, no dyspnea at rest.   I/Os net negative 2739.  CVP 18 today with co-ox 59% (Fick CI 1.7) and lactate 1.8 on milrinone 0.25 mcg/kg/min.  He remains on amiodarone 30 mg/hr with frequent PVCs. Creatinine 2.3 => 2.17.    Objective:   Weight Range: 117.3 kg Body mass index is 29.88 kg/m.   Vital Signs:   Temp:  [97.2 F (36.2 C)-98.8 F (37.1 C)] 97.2 F (36.2 C) (09/28 0700) Pulse Rate:  [66-106] 76 (09/28 0900) Resp:  [9-27] 20 (09/28 0900) BP: (89-143)/(60-118) 107/84 (09/28 0900) SpO2:  [85 %-99 %] 97 % (09/28 0900) FiO2 (%):  [21 %] 21 % (09/27 2220) Last BM Date :  (PTA)  Weight change: Filed Weights   07/10/24 0622 07/11/24 0521 07/11/24 0801  Weight: 117.6 kg 117.5 kg 117.3 kg    Intake/Output:   Intake/Output Summary (Last 24 hours) at 07/12/2024 1019 Last data filed at 07/12/2024 0943 Gross per 24 hour  Intake 593.36 ml  Output 3835 ml  Net -3241.64 ml      Physical Exam    General:  Well appearing. No resp difficulty HEENT: Normal Neck: Supple. JVP 16 cm. Carotids 2+ bilat; no bruits. No lymphadenopathy or thyromegaly appreciated. Cor: PMI lateral. Regular rate & rhythm. No rubs, gallops or murmurs. Lungs: Clear Abdomen: Soft, nontender, nondistended. No hepatosplenomegaly. No bruits or masses. Good bowel sounds. Extremities: No cyanosis, clubbing, rash. 1+ ankle edema.  Neuro: Alert & orientedx3, cranial nerves grossly intact. moves all 4 extremities w/o difficulty. Affect pleasant   Telemetry   NSR with PVCs (personally reviewed)   Labs    CBC Recent Labs    07/09/24 1649 07/10/24 0450 07/11/24 0714  WBC 7.9 11.0* 12.5*  NEUTROABS 6.1  --  10.4*  HGB 15.2 15.1 15.7  HCT 45.5 45.0 48.1  MCV 96.4 95.3 98.6  PLT 217 210 146*    Basic Metabolic Panel Recent Labs    90/73/74 0450 07/11/24 0455 07/11/24 0928 07/11/24 1741 07/12/24 0451  NA 137   < >  --  137 137  K 3.8   < >  --  4.1 3.4*  CL 101   < >  --  99 101  CO2 23   < >  --  25 27  GLUCOSE 191*   < >  --  190* 164*  BUN 34*   < >  --  50* 48*  CREATININE 1.78*   < >  --  2.33* 2.17*  CALCIUM  8.9   < >  --  8.8* 8.3*  MG 2.7*  --  2.8*  --  2.3  PHOS 3.9  --   --   --   --    < > = values in this interval not displayed.   Liver Function Tests Recent Labs    07/11/24 0455 07/12/24 0451  AST 590* 585*  ALT 905* 1,295*  ALKPHOS 137* 130*  BILITOT 4.6* 2.8*  PROT 7.3 6.6  ALBUMIN 3.9 3.5   No results for input(s): LIPASE, AMYLASE in the last 72 hours. Cardiac Enzymes No results for input(s): CKTOTAL, CKMB, CKMBINDEX, TROPONINI in the last 72 hours.  BNP: BNP (last 3 results) Recent Labs    06/03/24 0933 06/10/24 1213 07/09/24  1649  BNP 795.1* 1,071.5* 3,316.0*    ProBNP (last 3 results) No results for input(s): PROBNP in the last 8760 hours.   D-Dimer No results for input(s): DDIMER in the last 72 hours. Hemoglobin A1C Recent Labs    07/10/24 0450  HGBA1C 6.0*   Fasting Lipid Panel Recent Labs    07/10/24 0450  CHOL 134  HDL 33*  LDLCALC 82  TRIG 93  CHOLHDL 4.1   Thyroid  Function Tests Recent Labs    07/10/24 0450  TSH 1.794    Other results:   Imaging    US  Abdomen Limited RUQ (LIVER/GB) Result Date: 07/11/2024 CLINICAL DATA:  [id number] Transaminitis [id number] Acute kidney injury [id number] EXAM: ULTRASOUND ABDOMEN LIMITED RIGHT UPPER QUADRANT COMPARISON:  None Available. FINDINGS: Gallbladder: No gallstones or wall thickening visualized. No sonographic Murphy sign noted by sonographer. Common bile duct: Diameter: 5 mm Liver: Subcentimeter nonspecific cyst within the left hepatic lobe. Within normal limits in parenchymal echogenicity. Portal vein is patent on color Doppler imaging with  normal direction of blood flow towards the liver. Other: None. IMPRESSION: Unremarkable right upper quadrant ultrasound. Electronically Signed   By: Morgane  Naveau M.D.   On: 07/11/2024 19:11   US  RENAL Result Date: 07/11/2024 CLINICAL DATA:  [id number] Transaminitis [id number] Acute kidney injury [id number] EXAM: RENAL / URINARY TRACT ULTRASOUND COMPLETE COMPARISON:  None Available. FINDINGS: Right Kidney: Renal measurements: 10.6 x 5.4 x 6.8 cm = volume: 203 mL. Echogenicity within normal limits. No mass or hydronephrosis visualized. Left Kidney: Renal measurements: 10.4 x 6.8 x 6.1 cm = volume: 225 mL. Echogenicity within normal limits. No mass or hydronephrosis visualized. Urinary bladder: Appears normal for degree of bladder distention. Other: None. IMPRESSION: Unremarkable renal ultrasound. Electronically Signed   By: Morgane  Naveau M.D.   On: 07/11/2024 19:10   DG Chest Port 1 View Result Date: 07/11/2024 EXAM: 1 VIEW XRAY OF THE CHEST 07/11/2024 12:32:00 PM COMPARISON: 07/09/2024. CLINICAL HISTORY: Encounter for central line placement. FINDINGS: LINES, TUBES AND DEVICES: Interval placement of left IJ catheter with tip in the projection of the SVC. LUNGS AND PLEURA: No focal pulmonary opacity. No pulmonary edema. No pleural effusion. No pneumothorax identified. HEART AND MEDIASTINUM: Stable left chest wall single lead ICD with lead in the right ventricle. Stable cardiomediastinal contours. BONES AND SOFT TISSUES: No acute osseous abnormality. IMPRESSION: 1. Left internal jugular catheter with tip projecting over the SVC; no pneumothorax. 2. Stable left chest wall single-lead ICD with lead in the right ventricle. Electronically signed by: Waddell Calk MD 07/11/2024 12:39 PM EDT RP Workstation: [id number]   US  EKG SITE RITE Result Date: 07/11/2024 If Site Rite image not attached, placement could not be confirmed due to current cardiac rhythm.    Medications:     Scheduled Medications:  apixaban    5 mg Oral BID   Chlorhexidine  Gluconate Cloth  6 each Topical Daily   dapagliflozin  propanediol  10 mg Oral Daily   insulin  aspart  0-6 Units Subcutaneous TID WC   sodium chloride  flush  3 mL Intravenous Q12H    Infusions:  amiodarone 30 mg/hr (07/12/24 9391)   furosemide  (LASIX ) 200 mg in dextrose  5 % 100 mL (2 mg/mL) infusion 15 mg/hr (07/12/24 9391)   milrinone 0.25 mcg/kg/min (07/12/24 0608)    PRN Medications: acetaminophen , albuterol , alum & mag hydroxide-simeth, melatonin, ondansetron  (ZOFRAN ) IV, polyethylene glycol, sodium chloride  flush   Assessment/Plan   1. Acute on chronic systolic CHF/Cardiogenic shock: Long history of nonischemic cardiomyopathy. Has Biotronik  ICD.  History of low output on RHC and poor CPX but with minimal symptoms in the past.  Considering advanced therapies, has preferred transplant.  No smoking since 09/15/23.  Has controlled prostate cancer.  He was admitted to [facility] with volume overload and signs of low output HF AKI on CKD stage 3 and elevated lactate.  Milrinone 0.25 and Lasix  gtt 15 mg/hr begun, I/Os net negative 2739.  He remains volume overloaded with CVP 18, co-ox 59% corresponding to Fick CI 1.7, lactate now normal.   - Increase milrinone to 0.375.  - Continue Lasix  15 mg/hr and add metolazone 5 mg x 1.  Replace K.  Repeat BMET in pm.   - He is on dapagliflozin  - Entresto , spironolactone  on hold with AKI.  - Beta blocker on hold with low output/CGS.  - Will need ongoing workup for advanced therapies.  2. AKI on CKD stage 3: Creatinine 2.3 => 2.17.  Suspect cardiorenal.    3. PVCs/NSVT: Frequent on milrinone.  - Continue amiodarone gtt 30 mg/hr to control.  4. Atrial fibrillation: Paroxysmal. NSR currently.  - Continue Eliquis .  Does not need aspirin .  5. Prostate cancer: Treated.  6. Brain tumor: ?Meningioma.  Ongoing workup.  7. Elevated LFTs: In setting of cardiogenic shock.  - Follow LFTs.   CRITICAL CARE Performed by: Ezra Shuck  Total critical care time: 40 minutes  Critical care time was exclusive of separately billable procedures and treating other patients.  Critical care was necessary to treat or prevent imminent or life-threatening deterioration.  Critical care was time spent personally by me on the following activities: development of treatment plan with patient and/or surrogate as well as nursing, discussions with consultants, evaluation of patient's response to treatment, examination of patient, obtaining history from patient or surrogate, ordering and performing treatments and interventions, ordering and review of laboratory studies, ordering and review of radiographic studies, pulse oximetry and re-evaluation of patient's condition.  Length of Stay: 3  Ezra Shuck, MD  07/12/2024, 10:19 AM  Advanced Heart Failure Team Pager [phone] (M-F; 7a - 5p)  Please contact [facility] for night-coverage after hours (5p -7a ) and weekends on [email or web address]

## 2024-07-12 NOTE — Progress Notes (Signed)
 PHARMACY - PHYSICIAN COMMUNICATION CRITICAL VALUE ALERT - BLOOD CULTURE IDENTIFICATION (BCID)  James [name] is an 63 y.o. male who presented to [facility] on 07/09/2024 with Acute congestive HF.   Assessment:  1/2 aerobic bottles positive for staph epi. Likely a contaminant.   Name of physician (or Provider) Contacted: Dr. Rolan  Current antibiotics: None  Changes to prescribed antibiotics recommended:  None.  No results found for this or any previous visit.  [name] [name] 07/12/2024  7:37 AM

## 2024-07-13 DIAGNOSIS — N179 Acute kidney failure, unspecified: Secondary | ICD-10-CM | POA: Diagnosis not present

## 2024-07-13 DIAGNOSIS — I5023 Acute on chronic systolic (congestive) heart failure: Secondary | ICD-10-CM | POA: Diagnosis not present

## 2024-07-13 DIAGNOSIS — N183 Chronic kidney disease, stage 3 unspecified: Secondary | ICD-10-CM | POA: Diagnosis not present

## 2024-07-13 DIAGNOSIS — R570 Cardiogenic shock: Secondary | ICD-10-CM | POA: Diagnosis not present

## 2024-07-13 LAB — COMPREHENSIVE METABOLIC PANEL WITH GFR
ALT: 1083 U/L — ABNORMAL HIGH (ref 0–44)
AST: 244 U/L — ABNORMAL HIGH (ref 15–41)
Albumin: 3.9 g/dL (ref 3.5–5.0)
Alkaline Phosphatase: 142 U/L — ABNORMAL HIGH (ref 38–126)
Anion gap: 13 (ref 5–15)
BUN: 41 mg/dL — ABNORMAL HIGH (ref 8–23)
CO2: 32 mmol/L (ref 22–32)
Calcium: 8.9 mg/dL (ref 8.9–10.3)
Chloride: 92 mmol/L — ABNORMAL LOW (ref 98–111)
Creatinine, Ser: 1.78 mg/dL — ABNORMAL HIGH (ref 0.61–1.24)
GFR, Estimated: 42 mL/min — ABNORMAL LOW (ref >60)
Glucose, Bld: 160 mg/dL — ABNORMAL HIGH (ref 70–99)
Potassium: 2.7 mmol/L — CL (ref 3.5–5.1)
Sodium: 137 mmol/L (ref 135–145)
Total Bilirubin: 2.7 mg/dL — ABNORMAL HIGH (ref 0.0–1.2)
Total Protein: 7.7 g/dL (ref 6.5–8.1)

## 2024-07-13 LAB — BASIC METABOLIC PANEL WITH GFR
Anion gap: 11 (ref 5–15)
BUN: 35 mg/dL — ABNORMAL HIGH (ref 8–23)
CO2: 33 mmol/L — ABNORMAL HIGH (ref 22–32)
Calcium: 8.3 mg/dL — ABNORMAL LOW (ref 8.9–10.3)
Chloride: 89 mmol/L — ABNORMAL LOW (ref 98–111)
Creatinine, Ser: 1.58 mg/dL — ABNORMAL HIGH (ref 0.61–1.24)
GFR, Estimated: 49 mL/min — ABNORMAL LOW (ref >60)
Glucose, Bld: 150 mg/dL — ABNORMAL HIGH (ref 70–99)
Potassium: 3.5 mmol/L (ref 3.5–5.1)
Sodium: 133 mmol/L — ABNORMAL LOW (ref 135–145)

## 2024-07-13 LAB — GLUCOSE, CAPILLARY
Glucose-Capillary: 182 mg/dL — ABNORMAL HIGH (ref 70–99)
Glucose-Capillary: 250 mg/dL — ABNORMAL HIGH (ref 70–99)
Glucose-Capillary: 213 mg/dL — ABNORMAL HIGH (ref 70–99)
Glucose-Capillary: 185 mg/dL — ABNORMAL HIGH (ref 70–99)
Glucose-Capillary: 207 mg/dL — ABNORMAL HIGH (ref 70–99)

## 2024-07-13 LAB — COOXEMETRY PANEL
Carboxyhemoglobin: 2.1 % — ABNORMAL HIGH (ref 0.5–1.5)
Methemoglobin: <0.7 % (ref 0.0–1.5)
O2 Saturation: 64.8 %
Total hemoglobin: 15.8 g/dL (ref 12.0–16.0)

## 2024-07-13 LAB — MAGNESIUM: Magnesium: 2.3 mg/dL (ref 1.7–2.4)

## 2024-07-13 MED ORDER — MEXILETINE HCL 250 MG PO CAPS
250.0000 mg | ORAL_CAPSULE | Freq: Two times a day (BID) | ORAL | Status: DC
  Administered 2024-07-13 – 2024-07-15 (×4): 250 mg via ORAL
  Filled 2024-07-13 (×4): qty 1

## 2024-07-13 MED ORDER — POTASSIUM CHLORIDE CRYS ER 20 MEQ PO TBCR
40.0000 meq | EXTENDED_RELEASE_TABLET | Freq: Every day | ORAL | Status: DC
  Administered 2024-07-13 – 2024-07-16 (×4): 40 meq via ORAL
  Filled 2024-07-13 (×4): qty 2

## 2024-07-13 MED ORDER — POTASSIUM CHLORIDE CRYS ER 20 MEQ PO TBCR
40.0000 meq | EXTENDED_RELEASE_TABLET | ORAL | Status: AC
  Administered 2024-07-13 (×2): 40 meq via ORAL
  Filled 2024-07-13 (×2): qty 2

## 2024-07-13 MED ORDER — POTASSIUM CHLORIDE 10 MEQ/100ML IV SOLN
10.0000 meq | INTRAVENOUS | Status: AC
  Administered 2024-07-13 (×2): 10 meq via INTRAVENOUS
  Filled 2024-07-13 (×2): qty 100

## 2024-07-13 MED ORDER — POTASSIUM CHLORIDE CRYS ER 20 MEQ PO TBCR
40.0000 meq | EXTENDED_RELEASE_TABLET | Freq: Once | ORAL | Status: AC
  Administered 2024-07-13: 40 meq via ORAL
  Filled 2024-07-13: qty 2

## 2024-07-13 MED ORDER — DIGOXIN 125 MCG PO TABS
0.1250 mg | ORAL_TABLET | Freq: Every day | ORAL | Status: DC
  Administered 2024-07-13 – 2024-07-17 (×5): 0.125 mg via ORAL
  Filled 2024-07-13 (×5): qty 1

## 2024-07-13 MED ORDER — ORAL CARE MOUTH RINSE: 15.0000 mL | OROMUCOSAL | Status: DC | PRN

## 2024-07-13 MED ORDER — SPIRONOLACTONE 25 MG PO TABS
25.0000 mg | ORAL_TABLET | Freq: Every day | ORAL | Status: DC
  Administered 2024-07-13 – 2024-07-17 (×5): 25 mg via ORAL
  Filled 2024-07-13 (×5): qty 1

## 2024-07-13 NOTE — Plan of Care (Signed)

## 2024-07-13 NOTE — Progress Notes (Signed)
 Remote ICD Transmission

## 2024-07-13 NOTE — Progress Notes (Signed)
 Heart Failure Navigator Progress Note  Assessed for Heart & Vascular TOC clinic readiness.  Patient does not meet criteria due to Advanced Heart Failure Team patient.   Navigator will sign off at this time.    Rhae Hammock, BSN, Scientist, clinical (histocompatibility and immunogenetics) Only

## 2024-07-13 NOTE — Progress Notes (Addendum)
 Patient ID: James [name], male   DOB: November 27, 1960, 63 y.o.   MRN: [id number]     Advanced Heart Failure Rounding Note  Cardiologist: Maude Emmer, MD  Chief Complaint: CHF Subjective:    Net - 8.2L on Lasix  15/hr w metolazone 5 mg + Milrinone 0.375. Coox 65% with efick CI 2.3, CVP 7  Cr improving 2.17 > 1.88 > 1.78.  K 2.7  Sitting up in chair. Feeling well this morning. No SOB or CP  Objective:    Weight Range: 111.4 kg Body mass index is 28.38 kg/m.   Vital Signs:   Temp:  [97.3 F (36.3 C)-98.8 F (37.1 C)] 97.7 F (36.5 C) (09/29 0801) Pulse Rate:  [53-99] 83 (09/29 0800) Resp:  [9-32] 14 (09/29 0800) BP: (100-152)/(69-137) 119/91 (09/29 0800) SpO2:  [88 %-100 %] 97 % (09/29 0800) FiO2 (%):  [21 %] 21 % (09/28 2257) Weight:  [111.4 kg] 111.4 kg (09/29 0500) Last BM Date : 07/11/24  Weight change: Filed Weights   07/11/24 0521 07/11/24 0801 07/13/24 0500  Weight: 117.5 kg 117.3 kg 111.4 kg   Intake/Output:  Intake/Output Summary (Last 24 hours) at 07/13/2024 0916 Last data filed at 07/13/2024 0800 Gross per 24 hour  Intake 1331.47 ml  Output 9275 ml  Net -7943.53 ml    Physical Exam    General: Well appearing. No distress on RA Cardiac: JVP flat. S1 and S2 present. No murmurs or rub. Resp: Lung sounds clear and equal B/L Extremities: Warm and dry.  No peripheral edema.  Neuro: Alert and oriented x3. Affect pleasant. Moves all extremities without difficulty.  Telemetry   NSR 70-80s, very frequent PVCs (personally reviewed)  Labs    CBC Recent Labs    07/11/24 0714  WBC 12.5*  NEUTROABS 10.4*  HGB 15.7  HCT 48.1  MCV 98.6  PLT 146*   Basic Metabolic Panel Recent Labs    90/71/74 0451 07/12/24 1700 07/13/24 0347  NA 137 138 137  K 3.4* 3.5 2.7*  CL 101 95* 92*  CO2 27 30 32  GLUCOSE 164* 157* 160*  BUN 48* 43* 41*  CREATININE 2.17* 1.88* 1.78*  CALCIUM  8.3* 8.8* 8.9  MG 2.3  --  2.3   Liver Function Tests Recent Labs     07/12/24 0451 07/13/24 0347  AST 585* 244*  ALT 1,295* 1,083*  ALKPHOS 130* 142*  BILITOT 2.8* 2.7*  PROT 6.6 7.7  ALBUMIN 3.5 3.9   BNP (last 3 results) Recent Labs    06/03/24 0933 06/10/24 1213 07/09/24 1649  BNP 795.1* 1,071.5* 3,316.0*   Medications:    Scheduled Medications:  apixaban   5 mg Oral BID   Chlorhexidine  Gluconate Cloth  6 each Topical Daily   dapagliflozin  propanediol  10 mg Oral Daily   insulin  aspart  0-6 Units Subcutaneous TID WC   potassium chloride   40 mEq Oral Daily   potassium chloride   40 mEq Oral Q4H   sodium chloride  flush  3 mL Intravenous Q12H    Infusions:  amiodarone 30 mg/hr (07/13/24 0800)   furosemide  (LASIX ) 200 mg in dextrose  5 % 100 mL (2 mg/mL) infusion 5 mg/hr (07/13/24 0800)   milrinone 0.375 mcg/kg/min (07/13/24 0800)    PRN Medications: acetaminophen , albuterol , alum & mag hydroxide-simeth, melatonin, ondansetron  (ZOFRAN ) IV, polyethylene glycol, sodium chloride  flush  Assessment/Plan   1. Acute on chronic systolic CHF/Cardiogenic shock: Shock resolved. Long history of nonischemic cardiomyopathy. Has Biotronik ICD.  History of low output on RHC and  poor CPX but with minimal symptoms in the past.  Considering advanced therapies, has preferred transplant.  No smoking since 09/15/23.  Has controlled prostate cancer.  He was admitted to [facility] with volume overload and signs of low output HF AKI on CKD stage 3 and elevated lactate. Started on Milrinone and Lasix  gtt. Co-ox 65, CVP 7. Fick CO/CI 5.8/2.3. - continue milrinone to 0.375 mcg/kg/min - significant UOP yesterday of 9L, CVP now low. Stop lasix  gtt. Add PO tomorrow.  - add digoxin - continue dapagliflozin  10 mg daily - restart spiro 25 mg daily - Entresto  on hold with AKI.  - Beta blocker on hold with low output/CGS.  - Will need ongoing workup for advanced therapies.  2. AKI on CKD stage 3: Creatinine improving with diuresis  Suspect cardiorenal. Cr 2.3 >2.17 > 1.78 3.  PVCs/NSVT: Frequent on milrinone.  - increase amiodarone gtt to 60 mg/hr to control, having ~30-35/min 4. Atrial fibrillation: Paroxysmal. NSR currently.  - Continue Eliquis .  Does not need aspirin .  5. Prostate cancer: Treated.  6. Brain tumor: ?Meningioma.  Ongoing workup.  7. Elevated LFTs: In setting of cardiogenic shock. Slowly improving.  - Follow LFTs.  8. Hypokalemia: in the setting of diuresis. K 2.7, replete with IV and PO. Start spiro.  Length of Stay: 4  [name], NP  07/13/2024, 9:16 AM  Advanced Heart Failure Team Pager [phone] (M-F; 7a - 5p)  Please contact [facility] for night-coverage after hours (5p -7a ) and weekends on [email or web address]  Patient seen and examined with the above-signed Advanced Practice Provider and/or Housestaff. I personally reviewed laboratory data, imaging studies and relevant notes. I independently examined the patient and formulated the important aspects of the plan. I have edited the note to reflect any of my changes or salient points. I have personally discussed the plan with the patient and/or family.  Remains on milrinone 0.375. Co-ox 65% has diuresed very well. Breathing better. Scr improving,   Continues with frequent pVCs despite IV amio  General: Sitting up in chair No resp difficulty HEENT: normal Neck: supple. JVP up Carotids 2+ bilat; no bruits. No lymphadenopathy or thryomegaly appreciated. Cor: PMI nondisplaced. Irregular rate & rhythm. No rubs, gallops or murmurs. Lungs: clear Abdomen: soft, nontender, nondistended. No hepatosplenomegaly. No bruits or masses. Good bowel sounds. Extremities: no cyanosis, clubbing, rash, edema Neuro: alert & orientedx3, cranial nerves grossly intact. moves all 4 extremities w/o difficulty. Affect pleasant   Shock resolved on milrinone. Volume status now improved. Can hold IV lasix . Long talk about need for advanced therapies. He is most interested in transplant. However size may be a barrier. We  also discussed potential LVAD as bridge.   Will have VAD team see tomorrow. Add mexilitene for PVCs. I hope EF may improve with PVC suppression .  Supp K  CRITICAL CARE Performed by: Deneshia Zucker  Total critical care time: 40 minutes  Critical care time was exclusive of separately billable procedures and treating other patients.  Critical care was necessary to treat or prevent imminent or life-threatening deterioration.  Critical care was time spent personally by me (independent of midlevel providers or residents) on the following activities: development of treatment plan with patient and/or surrogate as well as nursing, discussions with consultants, evaluation of patient's response to treatment, examination of patient, obtaining history from patient or surrogate, ordering and performing treatments and interventions, ordering and review of laboratory studies, ordering and review of radiographic studies, pulse oximetry and re-evaluation of patient's condition.  Toribio Fuel, MD  11:15 PM

## 2024-07-13 NOTE — Plan of Care (Signed)
  Problem: Education: Goal: Knowledge of General Education information will improve Description: Including pain rating scale, medication(s)/side effects and non-pharmacologic comfort measures Outcome: Progressing   Problem: Clinical Measurements: Goal: Will remain free from infection Outcome: Progressing Goal: Respiratory complications will improve Outcome: Progressing   Problem: Activity: Goal: Risk for activity intolerance will decrease Outcome: Progressing   Problem: Nutrition: Goal: Adequate nutrition will be maintained Outcome: Progressing   Problem: Coping: Goal: Level of anxiety will decrease Outcome: Progressing   

## 2024-07-13 NOTE — Progress Notes (Signed)
   07/13/24 2234  BiPAP/CPAP/SIPAP  BiPAP/CPAP/SIPAP Pt Type Adult  BiPAP/CPAP/SIPAP Resmed  Mask Type Full face mask  Mask Size Medium  Respiratory Rate 18 breaths/min  EPAP 5 cmH2O  FiO2 (%) 21 %  Patient Home Machine No  Patient Home Mask No  Patient Home Tubing No  Auto Titrate No  Device Plugged into RED Power Outlet Yes  BiPAP/CPAP /SiPAP Vitals  Resp 16

## 2024-07-14 ENCOUNTER — Encounter (HOSPITAL_COMMUNITY): Admitting: Cardiology

## 2024-07-14 ENCOUNTER — Other Ambulatory Visit (HOSPITAL_COMMUNITY): Payer: Self-pay

## 2024-07-14 ENCOUNTER — Inpatient Hospital Stay (HOSPITAL_COMMUNITY)

## 2024-07-14 ENCOUNTER — Telehealth (HOSPITAL_COMMUNITY): Payer: Self-pay | Admitting: Pharmacy Technician

## 2024-07-14 DIAGNOSIS — R220 Localized swelling, mass and lump, head: Secondary | ICD-10-CM | POA: Diagnosis not present

## 2024-07-14 DIAGNOSIS — I5041 Acute combined systolic (congestive) and diastolic (congestive) heart failure: Secondary | ICD-10-CM | POA: Diagnosis not present

## 2024-07-14 DIAGNOSIS — D320 Benign neoplasm of cerebral meninges: Secondary | ICD-10-CM | POA: Diagnosis not present

## 2024-07-14 LAB — CULTURE, BLOOD (ROUTINE X 2): Special Requests: ADEQUATE

## 2024-07-14 LAB — MAGNESIUM: Magnesium: 2.5 mg/dL — ABNORMAL HIGH (ref 1.7–2.4)

## 2024-07-14 LAB — GLUCOSE, CAPILLARY
Glucose-Capillary: 166 mg/dL — ABNORMAL HIGH (ref 70–99)
Glucose-Capillary: 266 mg/dL — ABNORMAL HIGH (ref 70–99)
Glucose-Capillary: 162 mg/dL — ABNORMAL HIGH (ref 70–99)
Glucose-Capillary: 146 mg/dL — ABNORMAL HIGH (ref 70–99)

## 2024-07-14 LAB — CBC
HCT: 45.5 % (ref 39.0–52.0)
Hemoglobin: 15.5 g/dL (ref 13.0–17.0)
MCH: 32.1 pg (ref 26.0–34.0)
MCHC: 34.1 g/dL (ref 30.0–36.0)
MCV: 94.2 fL (ref 80.0–100.0)
Platelets: 158 K/uL (ref 150–400)
RBC: 4.83 MIL/uL (ref 4.22–5.81)
RDW: 14.8 % (ref 11.5–15.5)
WBC: 9.9 K/uL (ref 4.0–10.5)
nRBC: 0.6 % — ABNORMAL HIGH (ref 0.0–0.2)

## 2024-07-14 LAB — PREALBUMIN: Prealbumin: 21 mg/dL (ref 18–38)

## 2024-07-14 LAB — IRON AND TIBC
Iron: 46 ug/dL (ref 45–182)
Saturation Ratios: 21 % (ref 17.9–39.5)
TIBC: 223 ug/dL — ABNORMAL LOW (ref 250–450)
UIBC: 177 ug/dL

## 2024-07-14 LAB — URIC ACID: Uric Acid, Serum: 12.1 mg/dL — ABNORMAL HIGH (ref 3.7–8.6)

## 2024-07-14 LAB — PROTIME-INR
INR: 1.4 — ABNORMAL HIGH (ref 0.8–1.2)
Prothrombin Time: 17.9 s — ABNORMAL HIGH (ref 11.4–15.2)

## 2024-07-14 LAB — COMPREHENSIVE METABOLIC PANEL WITH GFR
ALT: 874 U/L — ABNORMAL HIGH (ref 0–44)
AST: 178 U/L — ABNORMAL HIGH (ref 15–41)
Albumin: 3.5 g/dL (ref 3.5–5.0)
Alkaline Phosphatase: 136 U/L — ABNORMAL HIGH (ref 38–126)
Anion gap: 15 (ref 5–15)
BUN: 32 mg/dL — ABNORMAL HIGH (ref 8–23)
CO2: 32 mmol/L (ref 22–32)
Calcium: 8.5 mg/dL — ABNORMAL LOW (ref 8.9–10.3)
Chloride: 86 mmol/L — ABNORMAL LOW (ref 98–111)
Creatinine, Ser: 1.44 mg/dL — ABNORMAL HIGH (ref 0.61–1.24)
GFR, Estimated: 55 mL/min — ABNORMAL LOW (ref >60)
Glucose, Bld: 171 mg/dL — ABNORMAL HIGH (ref 70–99)
Potassium: 3.6 mmol/L (ref 3.5–5.1)
Sodium: 133 mmol/L — ABNORMAL LOW (ref 135–145)
Total Bilirubin: 2.3 mg/dL — ABNORMAL HIGH (ref 0.0–1.2)
Total Protein: 6.8 g/dL (ref 6.5–8.1)

## 2024-07-14 LAB — COOXEMETRY PANEL
Carboxyhemoglobin: 1.6 % — ABNORMAL HIGH (ref 0.5–1.5)
Carboxyhemoglobin: 1.3 % (ref 0.5–1.5)
Methemoglobin: <0.7 % (ref 0.0–1.5)
Methemoglobin: 1.1 % (ref 0.0–1.5)
O2 Saturation: 64.3 %
O2 Saturation: 58.2 %
Total hemoglobin: 15.7 g/dL (ref 12.0–16.0)
Total hemoglobin: 16.6 g/dL — ABNORMAL HIGH (ref 12.0–16.0)

## 2024-07-14 LAB — APTT: aPTT: 34 s (ref 24–36)

## 2024-07-14 LAB — LACTATE DEHYDROGENASE: LDH: 356 U/L — ABNORMAL HIGH (ref 98–192)

## 2024-07-14 LAB — HEPATITIS C ANTIBODY: HCV Ab: NONREACTIVE

## 2024-07-14 LAB — FERRITIN: Ferritin: 1001 ng/mL — ABNORMAL HIGH (ref 24–336)

## 2024-07-14 LAB — T4, FREE: Free T4: 1.63 ng/dL — ABNORMAL HIGH (ref 0.61–1.12)

## 2024-07-14 LAB — ANTITHROMBIN III: AntiThromb III Func: 98 % (ref 75–120)

## 2024-07-14 LAB — HEPATITIS B SURFACE ANTIGEN: Hepatitis B Surface Ag: NONREACTIVE

## 2024-07-14 MED ORDER — FUROSEMIDE 10 MG/ML IJ SOLN
80.0000 mg | Freq: Once | INTRAMUSCULAR | Status: AC
  Administered 2024-07-14: 80 mg via INTRAVENOUS
  Filled 2024-07-14: qty 8

## 2024-07-14 MED ORDER — INSULIN ASPART 100 UNIT/ML IJ SOLN: 0.0000 [IU] | Freq: Every day | INTRAMUSCULAR | Status: DC

## 2024-07-14 MED ORDER — POTASSIUM CHLORIDE CRYS ER 20 MEQ PO TBCR
40.0000 meq | EXTENDED_RELEASE_TABLET | Freq: Once | ORAL | Status: AC
  Administered 2024-07-14: 40 meq via ORAL
  Filled 2024-07-14: qty 2

## 2024-07-14 MED ORDER — INSULIN ASPART 100 UNIT/ML IJ SOLN
0.0000 [IU] | Freq: Three times a day (TID) | INTRAMUSCULAR | Status: DC
  Administered 2024-07-14 – 2024-07-15 (×4): 2 [IU] via SUBCUTANEOUS
  Administered 2024-07-16: 1 [IU] via SUBCUTANEOUS
  Administered 2024-07-16: 3 [IU] via SUBCUTANEOUS
  Administered 2024-07-16 – 2024-07-17 (×3): 2 [IU] via SUBCUTANEOUS

## 2024-07-14 NOTE — Plan of Care (Signed)
 Patient's vital signs were stable. No runs PVCs so the amio was D/C at 13:00 pm. Milrinone dose gtt was decreased to 0.25 mcg. Patient obtained a repeat CT scan to assess his brain mass. Wants to ambulate in the hallway before bedtime.

## 2024-07-14 NOTE — Progress Notes (Signed)
 MCS EDUCATION NOTE:                VAD evaluation consent reviewed and signed by Mr Zaremba and designated caregiver his wife -  Gerlene.  Initial VAD teaching completed with pt and caregiver.   VAD educational packet including Understanding Your Options with Advanced Heart Failure, [place] Patient Agreement for VAD Evaluation and Potential Implantation consent, and Abbott Heartmate 3 Left Ventricular Device (LVAD) Patient Guide, Heartmate 3 Left Ventricular Assist System Patient Education Program DVD, [place] HM III Patient Education, [place] Mechanical Circulatory Support Program, and Decision Aids for Left Ventricular Assist Device reviewed in detail and left at bedside for continued reference.   All questions answered regarding VAD implant, hospital stay, and what to expect when discharged home living with a heart pump. Pt identified his wife Gerlene as his primary caregiver if this therapy should be deemed appropriate for him.  Explained need for 24/7 care when pt is discharged home due to sternal precautions, adaptation to living on support, emotional support, consistent and meticulous exit site care and management, medication adherence and high volume of follow up visits with the VAD Clinic after discharge; both pt and caregiver verbalized understanding of above.   Explained that LVAD can be implanted for two indications in the setting of advanced left ventricular heart failure treatment:  Bridge to transplant - used for patients who cannot safely wait for heart transplant without this device.  Or    Destination therapy - used for patients until end of life or recovery of heart function.  Patient and caregiver(s) acknowledge that the indication at this point in time for LVAD therapy would be for DT due to recent cancer.   Provided brief equipment overview and demonstration with HeartMate III training loop including discussion on the following:   a) mobile power unit b)  system controller   c) universal Magazine features editor   d) battery clips   e) Batteries   f)  Perc lock   g) Percutaneous lead   Demonstrated and discussed:  a) changing power source on system controller from tethered (MPU) to untethered (battery) mode   b) changing power source on system controller from untethered (battery) to tethered (MPU) mode   c) how to monitor battery life both on the system controller and on each individual battery   d) changing batteries   Reviewed and supplied a copy of home inspection check list stressing that only three pronged grounded power outlets can be used for VAD equipment. Mr Mcnellis confirmed home has electrical outlets that will support the equipment along with access working telephone.  Identified the following lifestyle modifications while living on MCS:    1. No driving for at least three months and then only if doctor gives permission to do so.   2. No tub baths while pump implanted, and shower only when doctor gives permission.   3. No swimming or submersion in water  while implanted with pump.   4. No contact sports or engaging in jumping activities.   5. Always have a backup controller, charged spare batteries, and battery clips nearby at all times in case of emergency.   6. Call the doctor or hospital contact person if any change in how the pump sounds, feels, or works.   7. Plan to sleep only when connected to the power module.   8. Do not sleep on your stomach.   9. Keep a backup system controller, charged batteries, battery clips, and flashlight  near you during sleep in case of electrical power outage.   10. Exit site care including dressing changes, monitoring for infection, and importance of keeping percutaneous lead stabilized at all times.     Extended the option to have one of our current patients and caregiver(s) come to talk with them about living on support} to assist with decision making. We will bring a pt over tomorrow to meet with Mr  Laforte.  Reviewed pictures of VAD drive line, site care, dressing changes, and drive line stabilization including securement attachment device and abdominal binder. Discussed with pt and family that they will be required to purchase dressing supplies as long as patient has the VAD in place.   He will also need to abide by sternal precautions with no lifting >10lbs, pushing, pulling and will need assistance with adapting to new life style with VAD equipment and care.   Intermacs patient survival statistics through March 2025 reviewed with patient and caregiver as follows:    The patient understands that from this discussion it does not mean that they will receive the device, but that depends on an extensive evaluation process. The patient is aware of the fact that if at anytime they want to stop the evaluation process they can.  All questions have been answered at this time and contact information was provided should they encounter any further questions.  They are both agreeable at this time to the evaluation process and will move forward.    Lauraine Ip, RN VAD Coordinator   Office: [phone] 24/7 VAD Pager: [phone]

## 2024-07-14 NOTE — Telephone Encounter (Signed)
 Patient Product/process development scientist completed.    The patient is insured through [employer] [place] . Patient has [employer], may use a copay card, and/or apply for patient assistance if available.    Ran test claim for mexiletine 250 mg and the current 30 day co-pay is $0.00.   This test claim was processed through [facility]- copay amounts may vary at other pharmacies due to pharmacy/plan contracts, or as the patient moves through the different stages of their insurance plan.     Reyes Sharps, CPHT Pharmacy Technician III Certified Patient Advocate [facility] Patient Advocate Team Direct Number: [phone]  Fax: [phone]

## 2024-07-14 NOTE — Plan of Care (Signed)

## 2024-07-14 NOTE — Progress Notes (Signed)
   07/14/24 2253  BiPAP/CPAP/SIPAP  $ Non-Invasive Home Ventilator  Subsequent  BiPAP/CPAP/SIPAP Pt Type Adult  BiPAP/CPAP/SIPAP Resmed  Reason BIPAP/CPAP not in use Non-compliant  BiPAP/CPAP /SiPAP Vitals  Pulse Rate 72  Resp 13  SpO2 99 %  Bilateral Breath Sounds Clear;Diminished  MEWS Score/Color  MEWS Score 1  MEWS Score Color Green

## 2024-07-14 NOTE — Progress Notes (Cosign Needed)
 Patient ID: James [name], male   DOB: [birth year], 63 y.o.   MRN: [id number]     Advanced Heart Failure Rounding Note  Cardiologist: Maude Emmer, MD  Chief Complaint: CHF Subjective:    Net - 750cc. Co-ox 64% Milrinone 0.375. CVP 11 sCr continues to come down, now 1.44  Sitting up in bed. No SOB or chest pain. Has had some indigestion this morning.  Objective:    Weight Range: 112.8 kg Body mass index is 28.74 kg/m.   Vital Signs:   Temp:  [97.8 F (36.6 C)-98.7 F (37.1 C)] 98.7 F (37.1 C) (09/30 1037) Pulse Rate:  [64-90] 74 (09/30 0946) Resp:  [8-24] 24 (09/30 0730) BP: (100-139)/(62-112) 130/88 (09/30 0730) SpO2:  [84 %-100 %] 100 % (09/30 0730) FiO2 (%):  [21 %] 21 % (09/29 2234) Weight:  [112.8 kg] 112.8 kg (09/30 0600) Last BM Date : 07/12/24  Weight change: Filed Weights   07/11/24 0801 07/13/24 0500 07/14/24 0600  Weight: 117.3 kg 111.4 kg 112.8 kg   Intake/Output:  Intake/Output Summary (Last 24 hours) at 07/14/2024 1112 Last data filed at 07/14/2024 0949 Gross per 24 hour  Intake 1771.18 ml  Output 1860 ml  Net -88.82 ml    Physical Exam    General: Well appearing. No distress on RA Cardiac: JVP ~8. S1 and S2 present. No murmurs or rub. Extremities: Warm and dry.  No peripheral edema.  Neuro: Alert and oriented x3. Affect pleasant. Moves all extremities without difficulty.  Telemetry   SR in 70s, PVCs now 5-10/min (personally reviewed)  Labs    CBC Recent Labs    07/14/24 0421  WBC 9.9  HGB 15.5  HCT 45.5  MCV 94.2  PLT 158   Basic Metabolic Panel Recent Labs    90/70/74 0347 07/13/24 1505 07/14/24 0421  NA 137 133* 133*  K 2.7* 3.5 3.6  CL 92* 89* 86*  CO2 32 33* 32  GLUCOSE 160* 150* 171*  BUN 41* 35* 32*  CREATININE 1.78* 1.58* 1.44*  CALCIUM  8.9 8.3* 8.5*  MG 2.3  --  2.5*   Liver Function Tests Recent Labs    07/13/24 0347 07/14/24 0421  AST 244* 178*  ALT 1,083* 874*  ALKPHOS 142* 136*  BILITOT 2.7* 2.3*   PROT 7.7 6.8  ALBUMIN 3.9 3.5   BNP (last 3 results) Recent Labs    06/03/24 0933 06/10/24 1213 07/09/24 1649  BNP 795.1* 1,071.5* 3,316.0*   Medications:    Scheduled Medications:  apixaban   5 mg Oral BID   Chlorhexidine  Gluconate Cloth  6 each Topical Daily   dapagliflozin  propanediol  10 mg Oral Daily   digoxin  0.125 mg Oral Daily   insulin  aspart  0-6 Units Subcutaneous TID WC   mexiletine  250 mg Oral Q12H   potassium chloride   40 mEq Oral Daily   sodium chloride  flush  3 mL Intravenous Q12H   spironolactone   25 mg Oral Daily    Infusions:  amiodarone 30 mg/hr (07/14/24 0829)   milrinone 0.375 mcg/kg/min (07/14/24 0700)    PRN Medications: acetaminophen , albuterol , alum & mag hydroxide-simeth, melatonin, ondansetron  (ZOFRAN ) IV, mouth rinse, polyethylene glycol, sodium chloride  flush  Assessment/Plan   1. Acute on chronic systolic CHF/Cardiogenic shock: Shock resolved. Long history of nonischemic cardiomyopathy. Has Biotronik ICD.  History of low output on RHC and poor CPX but with minimal symptoms in the past.  Considering advanced therapies, has preferred transplant.  No smoking since 09/15/23.  Has controlled  prostate cancer.  He was admitted to [facility] with volume overload and signs of low output HF AKI on CKD stage 3 and elevated lactate. Started on Milrinone and Lasix  gtt. Co-ox 64, CVP 11.  - continue milrinone to 0.375 mcg/kg/min - will give a dose of IV Lasix  80 mg today - continue digoxin 0.125 mcg/kg/min - continue dapagliflozin  10 mg daily - continue spiro 25 mg daily - Entresto  on hold with AKI.  - Beta blocker on hold with low output/CGS.  - Will need ongoing workup for advanced therapies. VAD team to see today, has previously had VAD evaluation, however may need to update.  2. AKI on CKD stage 3: Creatinine improving with diuresis  Suspect cardiorenal. Cr 2.3 >2.17 > 1.78 > 1.44  3. PVCs/NSVT: Frequent on milrinone. Significantly improved with adding  mexilitine. PVC burden 5-10/min today. - continue mexilitine 250 mg bid - stop amio  4. Atrial fibrillation: Paroxysmal. NSR currently.  - Continue Eliquis . Does not need aspirin .   5. Prostate cancer: completed EBRT 4/25. Completed ST ADT.   6. Brain tumor: ?Meningioma. Question for mass on Head CT venogram 5/25.   7. Elevated LFTs: In setting of cardiogenic shock. Slowly improving.  - Follow LFTs.   8. Hypokalemia: in the setting of diuresis. K 3.6. On spiro.  Length of Stay: 5  [name], NP  07/14/2024, 11:12 AM  Advanced Heart Failure Team Pager [phone] (M-F; 7a - 5p)  Please contact [facility] for night-coverage after hours (5p -7a ) and weekends on [email or web address]  Patient seen and examined with the above-signed Advanced Practice Provider and/or Housestaff. I personally reviewed laboratory data, imaging studies and relevant notes. I independently examined the patient and formulated the important aspects of the plan. I have edited the note to reflect any of my changes or salient points. I have personally discussed the plan with the patient and/or family.  Remains on milrinone 0.375. Co-ox 64%. CVP going back up.  Scr improving   Denies CP or SOB.   General:  Sitting up in chair No resp difficulty HEENT: normal Neck: supple. JVP 7 Carotids 2+ bilat; no bruits. No lymphadenopathy or thryomegaly appreciated. Cor: Regular rate & rhythm. No rubs, gallops or murmurs. Lungs: clear Abdomen: soft, nontender, nondistended. No hepatosplenomegaly. No bruits or masses. Good bowel sounds. Extremities: no cyanosis, clubbing, rash, edema Neuro: alert & orientedx3, cranial nerves grossly intact. moves all 4 extremities w/o difficulty. Affect pleasant  He will need advanced therapies. I d/w him and also Dr. Devore at the [facility]   The question is if he will need home milrinone in the interim. Will begin milrinone wean and follow CVP and co-ox. Given shock on admit will have low threshold  to proceed with milrinone support as we proceed with transplant w/u.  Toribio Fuel, MD  11:53 PM

## 2024-07-15 ENCOUNTER — Inpatient Hospital Stay (HOSPITAL_COMMUNITY)

## 2024-07-15 DIAGNOSIS — Z95810 Presence of automatic (implantable) cardiac defibrillator: Secondary | ICD-10-CM

## 2024-07-15 DIAGNOSIS — I428 Other cardiomyopathies: Secondary | ICD-10-CM

## 2024-07-15 DIAGNOSIS — Z01810 Encounter for preprocedural cardiovascular examination: Secondary | ICD-10-CM

## 2024-07-15 DIAGNOSIS — I5089 Other heart failure: Secondary | ICD-10-CM

## 2024-07-15 DIAGNOSIS — Z7189 Other specified counseling: Secondary | ICD-10-CM

## 2024-07-15 DIAGNOSIS — N183 Chronic kidney disease, stage 3 unspecified: Secondary | ICD-10-CM

## 2024-07-15 DIAGNOSIS — I77810 Thoracic aortic ectasia: Secondary | ICD-10-CM

## 2024-07-15 DIAGNOSIS — I071 Rheumatic tricuspid insufficiency: Secondary | ICD-10-CM

## 2024-07-15 DIAGNOSIS — I5041 Acute combined systolic (congestive) and diastolic (congestive) heart failure: Secondary | ICD-10-CM | POA: Diagnosis not present

## 2024-07-15 DIAGNOSIS — I130 Hypertensive heart and chronic kidney disease with heart failure and stage 1 through stage 4 chronic kidney disease, or unspecified chronic kidney disease: Secondary | ICD-10-CM

## 2024-07-15 DIAGNOSIS — I480 Paroxysmal atrial fibrillation: Secondary | ICD-10-CM

## 2024-07-15 DIAGNOSIS — Z515 Encounter for palliative care: Secondary | ICD-10-CM

## 2024-07-15 LAB — CBC
HCT: 45.8 % (ref 39.0–52.0)
Hemoglobin: 15.4 g/dL (ref 13.0–17.0)
MCH: 32 pg (ref 26.0–34.0)
MCHC: 33.6 g/dL (ref 30.0–36.0)
MCV: 95 fL (ref 80.0–100.0)
Platelets: 155 K/uL (ref 150–400)
RBC: 4.82 MIL/uL (ref 4.22–5.81)
RDW: 14.8 % (ref 11.5–15.5)
WBC: 10.4 K/uL (ref 4.0–10.5)
nRBC: 0.2 % (ref 0.0–0.2)

## 2024-07-15 LAB — COMPREHENSIVE METABOLIC PANEL WITH GFR
ALT: 673 U/L — ABNORMAL HIGH (ref 0–44)
AST: 113 U/L — ABNORMAL HIGH (ref 15–41)
Albumin: 3.4 g/dL — ABNORMAL LOW (ref 3.5–5.0)
Alkaline Phosphatase: 124 U/L (ref 38–126)
Anion gap: 9 (ref 5–15)
BUN: 29 mg/dL — ABNORMAL HIGH (ref 8–23)
CO2: 33 mmol/L — ABNORMAL HIGH (ref 22–32)
Calcium: 8.6 mg/dL — ABNORMAL LOW (ref 8.9–10.3)
Chloride: 92 mmol/L — ABNORMAL LOW (ref 98–111)
Creatinine, Ser: 1.38 mg/dL — ABNORMAL HIGH (ref 0.61–1.24)
GFR, Estimated: 57 mL/min — ABNORMAL LOW (ref >60)
Glucose, Bld: 135 mg/dL — ABNORMAL HIGH (ref 70–99)
Potassium: 3.7 mmol/L (ref 3.5–5.1)
Sodium: 134 mmol/L — ABNORMAL LOW (ref 135–145)
Total Bilirubin: 3.8 mg/dL — ABNORMAL HIGH (ref 0.0–1.2)
Total Protein: 6.8 g/dL (ref 6.5–8.1)

## 2024-07-15 LAB — GLUCOSE, CAPILLARY
Glucose-Capillary: 156 mg/dL — ABNORMAL HIGH (ref 70–99)
Glucose-Capillary: 162 mg/dL — ABNORMAL HIGH (ref 70–99)
Glucose-Capillary: 194 mg/dL — ABNORMAL HIGH (ref 70–99)
Glucose-Capillary: 148 mg/dL — ABNORMAL HIGH (ref 70–99)

## 2024-07-15 LAB — COOXEMETRY PANEL
Carboxyhemoglobin: 1.9 % — ABNORMAL HIGH (ref 0.5–1.5)
Carboxyhemoglobin: 1.8 % — ABNORMAL HIGH (ref 0.5–1.5)
Methemoglobin: <0.7 % (ref 0.0–1.5)
Methemoglobin: <0.7 % (ref 0.0–1.5)
O2 Saturation: 60.9 %
O2 Saturation: 49.8 %
Total hemoglobin: 16.2 g/dL — ABNORMAL HIGH (ref 12.0–16.0)
Total hemoglobin: 16.2 g/dL — ABNORMAL HIGH (ref 12.0–16.0)

## 2024-07-15 LAB — MAGNESIUM: Magnesium: 2.5 mg/dL — ABNORMAL HIGH (ref 1.7–2.4)

## 2024-07-15 LAB — HEPATITIS B SURFACE ANTIBODY,QUALITATIVE: Hep B S Ab: REACTIVE — AB

## 2024-07-15 LAB — HEPATITIS B CORE ANTIBODY, TOTAL: HEP B CORE AB: NEGATIVE

## 2024-07-15 MED ORDER — MEXILETINE HCL 200 MG PO CAPS
200.0000 mg | ORAL_CAPSULE | Freq: Three times a day (TID) | ORAL | Status: DC
  Administered 2024-07-15 – 2024-07-17 (×7): 200 mg via ORAL
  Filled 2024-07-15 (×11): qty 1

## 2024-07-15 MED ORDER — POTASSIUM CHLORIDE CRYS ER 20 MEQ PO TBCR
40.0000 meq | EXTENDED_RELEASE_TABLET | Freq: Once | ORAL | Status: AC
Start: 2024-07-15 — End: 2024-07-15
  Administered 2024-07-15: 40 meq via ORAL
  Filled 2024-07-15: qty 2

## 2024-07-15 MED ORDER — MILRINONE LACTATE IN DEXTROSE 20-5 MG/100ML-% IV SOLN
0.2500 ug/kg/min | INTRAVENOUS | Status: DC
  Administered 2024-07-15: 0.125 ug/kg/min via INTRAVENOUS
  Administered 2024-07-15 – 2024-07-17 (×5): 0.25 ug/kg/min via INTRAVENOUS
  Filled 2024-07-15 (×5): qty 100

## 2024-07-15 MED ORDER — COLCHICINE 0.6 MG PO TABS
0.6000 mg | ORAL_TABLET | Freq: Every day | ORAL | Status: DC
  Administered 2024-07-15 – 2024-07-16 (×2): 0.6 mg via ORAL
  Filled 2024-07-15 (×2): qty 1

## 2024-07-15 MED ORDER — ADULT MULTIVITAMIN W/MINERALS CH
1.0000 | ORAL_TABLET | Freq: Every day | ORAL | Status: DC
  Administered 2024-07-15 – 2024-07-17 (×3): 1 via ORAL
  Filled 2024-07-15 (×3): qty 1

## 2024-07-15 MED ORDER — HYDRALAZINE HCL 25 MG PO TABS
25.0000 mg | ORAL_TABLET | Freq: Three times a day (TID) | ORAL | Status: DC
  Administered 2024-07-15 – 2024-07-16 (×3): 25 mg via ORAL
  Filled 2024-07-15 (×3): qty 1

## 2024-07-15 MED ORDER — TORSEMIDE 20 MG PO TABS
40.0000 mg | ORAL_TABLET | Freq: Every day | ORAL | Status: DC
  Administered 2024-07-15 – 2024-07-16 (×2): 40 mg via ORAL
  Filled 2024-07-15 (×2): qty 2

## 2024-07-15 MED ORDER — ENSURE PLUS HIGH PROTEIN PO LIQD
237.0000 mL | Freq: Three times a day (TID) | ORAL | Status: DC
  Administered 2024-07-15 – 2024-07-17 (×5): 237 mL via ORAL

## 2024-07-15 NOTE — Progress Notes (Signed)
 Initial Nutrition Assessment  DOCUMENTATION CODES:   Not applicable  INTERVENTION:   Continue 2g sodium diet  Add Ensure Plus High Protein po TID, each supplement provides 350 kcal and 20 grams of protein. Encouraged pt to supplement diet with oral nutrition supplement at home if appetite not improved at discharge.   MVI with Minerals daily; continue at discharge  Verbal Diet education provided; focused mostly on nutritional adequacy and reduced sodium diet  If continues without BM, recommend scheduled bowel regimen. Pt reports he received Miralax  daily but ordered prn.   NUTRITION DIAGNOSIS:   Increased nutrient needs related to chronic illness as evidenced by estimated needs.  GOAL:   Patient will meet greater than or equal to 90% of their needs  MONITOR:   PO intake, Supplement acceptance, Labs, Weight trends  REASON FOR ASSESSMENT:   Consult LVAD Eval  ASSESSMENT:   63 yo male admitted with acute on chronic systolic CHF with cardiogenic shock, AKI on CKD. PMH includes nonischemic CM leading to biventricular HFrEF s/p AICD, AKI on CKD3a, prostate cancer  9/25 Admitted 9/30 CT head: stable meningioma  Attempting to wean off milrinone, possible discharge home on milrinone if unable to wean. Per RN, plan is for pt to follow up with [facility] regarding possible heart transplant. If not a candidate for transplant, considering LVAD  Per RN, pt's appetite not great. Noted recorded po intake 25-100% since admission but only 6 meals documented since 9/26  Pt reports hospital food does not taste good to him. Pt has been eating cereal mostly. Pt reports   At baseline, pt is a creature of habit when it comes to his diet. Pt typically eats 2 meals per day which are usually the same thing each day.   Eats cereal in the AM and usually has a [employer] [place] sub around 8pm. Pt reports he does not really snack. Pt takes a one a day MVI at home.   Based on recent HgbA1c, DM well  controlled recently.   Elevated LFTs, TBili 3.8. Related to shock per MD notes  Current wt 108.2 kg (238 pounds). Pt reports his recent UBW has been around 255-260. UOP 2.3 L thus far today; noted 2.88 L in previous 24 hours. Net negative 14L since admission  Pt reports last good BM was on 9/27; noted order for prn miralax , pt reports he received today  Labs: HGbA1c 6.0  CBGs 146-194 (goal 140-180) Creatinine 1.38 (Baseline Creatinine around 1.3)  Meds: Farxiga  Digoxin Hydralazine  SS novolog  KCl   NUTRITION - FOCUSED PHYSICAL EXAM:  Flowsheet Row Most Recent Value  Orbital Region No depletion  Upper Arm Region No depletion  Thoracic and Lumbar Region No depletion  Buccal Region No depletion  Temple Region No depletion  Clavicle Bone Region No depletion  Clavicle and Acromion Bone Region No depletion  Scapular Bone Region No depletion  Dorsal Hand Unable to assess  Patellar Region Unable to assess  Anterior Thigh Region Unable to assess  Posterior Calf Region Unable to assess  Edema (RD Assessment) Mild    Diet Order:   Diet Order             Diet 2 gram sodium Room service appropriate? Yes; Fluid consistency: Thin  Diet effective now                   EDUCATION NEEDS:   Education needs have been addressed  Skin:  Skin Assessment: Reviewed RN Assessment  Last BM:  9/28  Height:   Ht Readings from Last 1 Encounters:  07/11/24 6' 6 (1.981 m)    Weight:   Wt Readings from Last 1 Encounters:  07/15/24 108.2 kg     BMI:  Body mass index is 27.57 kg/m.  Estimated Nutritional Needs:   Kcal:  2500-2700 kcals  Protein:  135-155 g  Fluid:  1.8-2L   Betsey Finger MS, RDN, LDN, CNSC Registered Dietitian 3 Clinical Nutrition RD Inpatient Contact Info in Amion

## 2024-07-15 NOTE — Progress Notes (Signed)
   07/15/24 2037  BiPAP/CPAP/SIPAP  BiPAP/CPAP/SIPAP Pt Type Adult  Reason BIPAP/CPAP not in use Non-compliant (patient refuse CPAP for the night. RN aware at bedside)

## 2024-07-15 NOTE — Evaluation (Signed)
 Physical Therapy Evaluation Patient Details Name: James [name] MRN: [id number] DOB: November 23, 1960 Today's Date: 07/15/2024  History of Present Illness  63 y.o. male admitted 9/25 with DOE and Worsening edema.  Pt with CHF and is getting w/u for LVAD/Transplant.  PMH: NICM, HFrEF with BiV systolic failure, s/p ICD, under evaluation for heart transplant / LVAD, pulm HTN, paroxsymal Afib/flutter on AC, NSVT, aortic root aneurysm, CKD stage 3a, HTN, HLD, DM2, OSA, former smoker, prostate CA s/p EBRT on ADT, brain mass/ Meningioma  Clinical Impression  Pt admitted with above diagnosis. Pt able to ambulate with RW with good stability. Pt should progress well. Pt may be transferred to [facility] per pt. Will follow acutely and ensure mobility while pt in hospital.  Pt currently with functional limitations due to the deficits listed below (see PT Problem List). Pt will benefit from acute skilled PT to increase their independence and safety with mobility to allow discharge.           If plan is discharge home, recommend the following: Assistance with cooking/housework;Help with stairs or ramp for entrance   Can travel by private vehicle        Equipment Recommendations None recommended by PT  Recommendations for Other Services       Functional Status Assessment Patient has had a recent decline in their functional status and demonstrates the ability to make significant improvements in function in a reasonable and predictable amount of time.     Precautions / Restrictions Precautions Precautions: None Restrictions Weight Bearing Restrictions Per Provider Order: No      Mobility  Bed Mobility Overal bed mobility: Independent                  Transfers Overall transfer level: Independent                      Ambulation/Gait Ambulation/Gait assistance: Supervision Gait Distance (Feet): 380 Feet Assistive device: Rolling walker (2 wheels) Gait Pattern/deviations: WFL(Within  Functional Limits)   Gait velocity interpretation: 1.31 - 2.62 ft/sec, indicative of limited community ambulator   General Gait Details: Pt able to walk on unit with RW with supervsion and no LOB with challenges to balance.  Stairs            Wheelchair Mobility     Tilt Bed    Modified Rankin (Stroke Patients Only)       Balance Overall balance assessment: Needs assistance Sitting-balance support: No upper extremity supported, Feet supported Sitting balance-Leahy Scale: Good     Standing balance support: No upper extremity supported, During functional activity Standing balance-Leahy Scale: Good                               Pertinent Vitals/Pain Pain Assessment Pain Assessment: No/denies pain    Home Living Family/patient expects to be discharged to:: Private residence Living Arrangements: Spouse/significant other Available Help at Discharge: Family;Available PRN/intermittently (wife works) Type of Home: House Home Access: Stairs to enter Entrance Stairs-Rails: Right;Left;Can reach both Secretary/administrator of Steps: 6   Home Layout: One level Home Equipment: None      Prior Function Prior Level of Function : Independent/Modified Independent;Working/employed;Driving             Mobility Comments: works at [employer] full time       Extremity/Trunk Assessment   Upper Extremity Assessment Upper Extremity Assessment: Defer to OT evaluation  Lower Extremity Assessment Lower Extremity Assessment: Overall WFL for tasks assessed    Cervical / Trunk Assessment Cervical / Trunk Assessment: Normal  Communication   Communication Communication: No apparent difficulties    Cognition Arousal: Alert Behavior During Therapy: WFL for tasks assessed/performed   PT - Cognitive impairments: No apparent impairments                         Following commands: Intact       Cueing       General Comments  General comments (skin integrity, edema, etc.): 72 bpm, 99% RA at rest and 91% on RA with acitivyt, 133/96    Exercises General Exercises - Lower Extremity Ankle Circles/Pumps: AROM, Both, 10 reps, Supine Long Arc Quad: AROM, Both, 10 reps, Seated Hip Flexion/Marching: AROM, Both, 10 reps, Seated   Assessment/Plan    PT Assessment Patient needs continued PT services  PT Problem List Decreased activity tolerance;Decreased balance;Decreased mobility;Decreased knowledge of use of DME;Decreased safety awareness;Cardiopulmonary status limiting activity       PT Treatment Interventions DME instruction;Gait training;Stair training;Functional mobility training;Therapeutic activities;Therapeutic exercise;Balance training;Patient/family education    PT Goals (Current goals can be found in the Care Plan section)  Acute Rehab PT Goals Patient Stated Goal: to go home PT Goal Formulation: With patient Time For Goal Achievement: 07/29/24 Potential to Achieve Goals: Good    Frequency Min 2X/week     Co-evaluation               AM-PAC PT 6 Clicks Mobility  Outcome Measure Help needed turning from your back to your side while in a flat bed without using bedrails?: None Help needed moving from lying on your back to sitting on the side of a flat bed without using bedrails?: None Help needed moving to and from a bed to a chair (including a wheelchair)?: None Help needed standing up from a chair using your arms (e.g., wheelchair or bedside chair)?: A Little Help needed to walk in hospital room?: A Little Help needed climbing 3-5 steps with a railing? : A Little 6 Click Score: 21    End of Session Equipment Utilized During Treatment: Gait belt Activity Tolerance: Patient tolerated treatment well Patient left: in chair;with call bell/phone within reach Nurse Communication: Mobility status PT Visit Diagnosis: Muscle weakness (generalized) (M62.81)    Time: 8952-8892 PT Time  Calculation (min) (ACUTE ONLY): 20 min   Charges:   PT Evaluation $PT Eval Low Complexity: 1 Low   PT General Charges $$ ACUTE PT VISIT: 1 Visit         Yianni Skilling M,PT Acute Rehab Services [phone]   Stephane JULIANNA Bevel 07/15/2024, 2:19 PM

## 2024-07-15 NOTE — Progress Notes (Signed)
 Pre-VAD vascular exams have been completed.   Results can be found under chart review under CV PROC. 07/15/2024 6:35 PM Jordynne Mccown RVT, RDMS

## 2024-07-15 NOTE — Consult Note (Signed)
 [address], [place] [address]             [phone]    [name] Medical Record #[id number] Date of Birth: [birth year]  Referring: No ref. provider found Primary Care: Bevely Doffing, FNP Primary Cardiologist:Peter Delford, MD  Chief Complaint:    Chief Complaint  Patient presents with   Shortness of Breath    History of Present Illness:     James [name] is a 63 y.o. male who presents for surgical evaluation of LVAD.  He has a history of NICM, Biotronik ICD, paroxysmal Afib/flutter on AC, NSVT, aortic root aneurysm, stage 3 CKD, HTN, HL, diabetes, former smoker and treated prostate CA who was admitted to [facility] with acute on chronic heart failure.  Since admission he has been diuresed and supported on Milrinone with significant improvement in his symptoms.  He currently feels well and his shortness of breath has improved.  He was evaluated for LVAD several years ago but then was found to have prostate cancer so has been undergoing treatment.  He has now completed treatment and is again undergoing LVAD vs transplant work up.  He personally strongly prefers transplant over LVAD but is ok with LVAD if transplant is not possible given recent cancer history.  Of note, he has also been found to have a meningioma. Per Neurosurgery notes, no need for intervention at this time.  Prior to this admission, he reports that he was very active.  He works as a Merchandiser, retail at [employer].  He has not been smoking, has never had a stroke and has been compliant with all his medications.  He smoked 1/2 PPD for 30 years but quit last year.    Lastly, RHC in March 2024 demonstrated a severely elevated PVR of 5.33-8.8.  Most recent cath in Feb 2025 showed improvement of PVR, down to 2.5-3.1.   Past Medical and Surgical History: Previous Chest Surgery: No Previous Chest Radiation: No Diabetes Mellitus: Yes.  HbA1C 6.0% on 07/10/24 Anticoagulation:  Eliquis , Last dose - ongoing  Creatinine:  Lab Results  Component Value Date   CREATININE 1.38 (H) 07/15/2024   CREATININE 1.44 (H) 07/14/2024   CREATININE 1.58 (H) 07/13/2024     Past Medical History:  Diagnosis Date   CKD (chronic kidney disease) stage 2, GFR 60-89 ml/min    COPD (chronic obstructive pulmonary disease) (HCC)    Diabetes mellitus type II, non insulin  dependent (HCC)    Dilated aortic root    Elevated PSA    Hypercholesteremia    Hypertension    NICM (nonischemic cardiomyopathy) (HCC)    NSVT (nonsustained ventricular tachycardia) (HCC)    Obstructive sleep apnea    Paroxysmal atrial fibrillation (HCC)    Pulmonary hypertension (HCC)    PVC's (premature ventricular contractions)    Tobacco abuse     Past Surgical History:  Procedure Laterality Date   CARDIAC CATHETERIZATION  10/15/2000   COLONOSCOPY N/A 03/22/2014   Procedure: COLONOSCOPY;  Surgeon: Margo LITTIE Haddock, MD;  Location: [facility] ENDO SUITE;  Service: Endoscopy;  Laterality: N/A;  11:15 AM   ICD IMPLANT N/A 08/21/2021   Procedure: ICD IMPLANT;  Surgeon: Waddell Danelle ORN, MD;  Location: [facility] INVASIVE CV LAB;  Service: Cardiovascular;  Laterality: N/A;   INGUINAL HERNIA REPAIR Right 06/24/2017   Procedure: HERNIA REPAIR INGUINAL ADULT WITH MESH;  Surgeon: Mavis Anes, MD;  Location: [facility] ORS;  Service: General;  Laterality: Right;   PROSTATE BIOPSY     RIGHT HEART CATH N/A 11/13/2022   Procedure: RIGHT HEART CATH;  Surgeon: Gardenia Led, DO;  Location: [facility] INVASIVE CV LAB;  Service: Cardiovascular;  Laterality: N/A;   RIGHT HEART CATH N/A 12/21/2022   Procedure: RIGHT HEART CATH;  Surgeon: Gardenia Led, DO;  Location: [facility] INVASIVE CV LAB;  Service: Cardiovascular;  Laterality: N/A;   RIGHT HEART CATH N/A 11/25/2023   Procedure: RIGHT HEART CATH;  Surgeon: Gardenia Led, DO;  Location: [facility] INVASIVE CV LAB;  Service: Cardiovascular;  Laterality: N/A;   RIGHT/LEFT HEART CATH AND CORONARY ANGIOGRAPHY  N/A 07/19/2021   Procedure: RIGHT/LEFT HEART CATH AND CORONARY ANGIOGRAPHY;  Surgeon: Verlin Lonni BIRCH, MD;  Location: [facility] INVASIVE CV LAB;  Service: Cardiovascular;  Laterality: N/A;   TEE WITHOUT CARDIOVERSION N/A 12/21/2022   Procedure: TRANSESOPHAGEAL ECHOCARDIOGRAM (TEE);  Surgeon: Gardenia Led, DO;  Location: [facility] ENDOSCOPY;  Service: Cardiovascular;  Laterality: N/A;    Social History:  Social History   Tobacco Use  Smoking Status Former   Current packs/day: 0.00   Average packs/day: 0.5 packs/day for 38.8 years (19.4 ttl pk-yrs)   Types: Cigarettes   Start date: 10/15/1982   Quit date: 07/18/2021   Years since quitting: 2.9  Smokeless Tobacco Never  Tobacco Comments   3 cigaretts a day    Social History   Substance and Sexual Activity  Alcohol Use No   Alcohol/week: 0.0 standard drinks of alcohol     No Known Allergies   Current Facility-Administered Medications  Medication Dose Route Frequency Provider Last Rate Last Admin   acetaminophen  (TYLENOL ) tablet 1,000 mg  1,000 mg Oral Q6H PRN Ricky Fines, MD   1,000 mg at 07/10/24 9361   albuterol  (PROVENTIL ) (2.5 MG/3ML) 0.083% nebulizer solution 2.5 mg  2.5 mg Nebulization Q4H PRN Ricky Fines, MD       alum & mag hydroxide-simeth (MAALOX/MYLANTA) 200-200-20 MG/5ML suspension 30 mL  30 mL Oral Q6H PRN Ricky Fines, MD   30 mL at 07/14/24 1117   apixaban  (ELIQUIS ) tablet 5 mg  5 mg Oral BID Ricky Fines, MD   5 mg at 07/15/24 0901   Chlorhexidine  Gluconate Cloth 2 % PADS 6 each  6 each Topical Daily Jesus America, NP   6 each at 07/15/24 0901   colchicine tablet 0.6 mg  0.6 mg Oral Daily Lee, [place], NP   0.6 mg at 07/15/24 1151   dapagliflozin  propanediol (FARXIGA ) tablet 10 mg  10 mg Oral Daily Ricky Fines, MD   10 mg at 07/15/24 0901   digoxin (LANOXIN) tablet 0.125 mg  0.125 mg Oral Daily Lee, [place], NP   0.125 mg at 07/15/24 0901   hydrALAZINE  (APRESOLINE ) tablet 25 mg  25 mg Oral Q8H Lee,  [place], NP   25 mg at 07/15/24 1151   insulin  aspart (novoLOG ) injection 0-5 Units  0-5 Units Subcutaneous QHS Lee, [place], NP       insulin  aspart (novoLOG ) injection 0-9 Units  0-9 Units Subcutaneous TID WC Lee, Jordan, NP   2 Units at 07/15/24 1151   melatonin tablet 6 mg  6 mg Oral QHS PRN Ricky Fines, MD   6 mg at 07/14/24 2108   mexiletine (MEXITIL) capsule 200 mg  200 mg Oral Q8H Lee, [place], NP   200 mg at 07/15/24 1325   milrinone (PRIMACOR) 20 MG/100 ML (0.2 mg/mL) infusion  0.25 mcg/kg/min Intravenous Continuous Lee, [place], NP 8.81 mL/hr at 07/15/24 1506 0.25 mcg/kg/min at  07/15/24 1506   ondansetron  (ZOFRAN ) injection 4 mg  4 mg Intravenous Q6H PRN Ricky Fines, MD   4 mg at 07/10/24 2211   Oral care mouth rinse  15 mL Mouth Rinse PRN Bensimhon, Toribio SAUNDERS, MD       polyethylene glycol (MIRALAX  / GLYCOLAX ) packet 17 g  17 g Oral Daily PRN Ricky Fines, MD   17 g at 07/15/24 0901   potassium chloride  SA (KLOR-CON  M) CR tablet 40 mEq  40 mEq Oral Daily Donnel Rima, MD   40 mEq at 07/15/24 0901   sodium chloride  flush (NS) 0.9 % injection 10-40 mL  10-40 mL Intracatheter PRN Harold Scholz, MD       sodium chloride  flush (NS) 0.9 % injection 3 mL  3 mL Intravenous Q12H Ricky Fines, MD   3 mL at 07/15/24 0901   spironolactone  (ALDACTONE ) tablet 25 mg  25 mg Oral Daily Lee, [place], NP   25 mg at 07/15/24 0901   torsemide  (DEMADEX ) tablet 40 mg  40 mg Oral Daily Lee, [place], NP   40 mg at 07/15/24 9065   Facility-Administered Medications Ordered in Other Encounters  Medication Dose Route Frequency Provider Last Rate Last Admin   sodium chloride  flush (NS) 0.9 % injection 3 mL  3 mL Intravenous Q12H Sabharwal, Aditya, DO        Medications Prior to Admission  Medication Sig Dispense Refill Last Dose/Taking   acetaminophen  (TYLENOL ) 500 MG tablet Take 1,000 mg by mouth every 6 (six) hours as needed for moderate pain.   07/08/2024 Noon   albuterol  (VENTOLIN  HFA) 108 (90 Base)  MCG/ACT inhaler INHALE 2 PUFFS BY MOUTH FOUR TIMES DAILY AS NEEDED 6.7 g 1 07/08/2024 Noon   apixaban  (ELIQUIS ) 5 MG TABS tablet Take 1 tablet (5 mg total) by mouth 2 (two) times daily. 60 tablet 11 07/09/2024 at 10:00 AM   aspirin  EC 81 MG tablet Take 81 mg by mouth daily. Swallow whole.   07/09/2024 at 10:00 AM   atorvastatin  (LIPITOR) 40 MG tablet Take 1 tablet (40 mg total) by mouth daily. 90 tablet 3 07/09/2024 Morning   carvedilol  (COREG ) 3.125 MG tablet Take 1 tablet (3.125 mg total) by mouth 2 (two) times daily with a meal. 60 tablet 5 07/09/2024 Morning   ENTRESTO  97-103 MG TAKE 1 TABLET BY MOUTH TWICE DAILY 60 tablet 11 07/09/2024 Morning   FARXIGA  10 MG TABS tablet TAKE 1 TABLET(10 MG) BY MOUTH DAILY BEFORE BREAKFAST 90 tablet 3 07/09/2024 Morning   metFORMIN (GLUCOPHAGE) 500 MG tablet TAKE 1 TABLET BY MOUTH TWICE DAILY 180 tablet 1 07/09/2024 Morning   spironolactone  (ALDACTONE ) 25 MG tablet Take 1 tablet (25 mg total) by mouth at bedtime. 90 tablet 3 07/08/2024 Bedtime   torsemide  (DEMADEX ) 20 MG tablet Take 3 tablets (60 mg total) by mouth daily. 90 tablet 2 07/09/2024 Morning    Family History  Problem Relation Age of Onset   Coronary artery disease Mother    Diabetes Father    Heart attack Father    Hypertension Father    Iron deficiency Father    Thyroid  disease Sister    Cardiomyopathy Brother        No significant coronary disease by coronary angiography   Heart disease Maternal Grandfather        Also paternal grandfather   Sudden death Other    Colon cancer Neg Hx      Review of Systems:   Review of Systems  Constitutional:  Positive for weight loss. Negative for chills and fever.  Respiratory:  Positive for shortness of breath. Negative for cough.   Cardiovascular:  Positive for leg swelling. Negative for chest pain and palpitations.  Gastrointestinal:  Negative for abdominal pain, nausea and vomiting.  Neurological:  Negative for dizziness and headaches.       Physical Exam: BP (!) 124/92   Pulse 76   Temp 98.7 F (37.1 C) (Oral)   Resp 15   Ht 6' 6 (1.981 m)   Wt 108.2 kg   SpO2 98%   BMI 27.57 kg/m  Physical Exam Constitutional:      Appearance: He is well-developed and normal weight.  HENT:     Head: Normocephalic and atraumatic.  Pulmonary:     Effort: Pulmonary effort is normal.     Breath sounds: Normal breath sounds.  Abdominal:     Palpations: Abdomen is soft.     Tenderness: There is no abdominal tenderness.  Skin:    General: Skin is warm and dry.  Neurological:     General: No focal deficit present.     Mental Status: He is alert and oriented to person, place, and time.       Diagnostic Studies & Laboratory data: Cardiac Studies & Procedures   ______________________________________________________________________________________________ CARDIAC CATHETERIZATION  CARDIAC CATHETERIZATION 11/25/2023  Conclusion HEMODYNAMICS: RA:   6 mmHg (mean) RV:   44/6 mmHg PA:   43/19 mmHg (30 mean) PCWP:  17 mmHg (mean)  Estimated Fick CO/CI   4.2 L/min, 1.7 L/min/m2 Thermodilution CO/CI   5.34 L/min, 2.2 L/min/m2  TPG    13  mmHg PVR     2.5-3.1 Wood Units PAPi      4   IMPRESSION: Mildly elevated pre and post capillary filling pressures Mildly elevated PA mean & PVR Moderate to severely reduced cardiac output & index  Aditya Sabharwal 10:13 AM   CARDIAC CATHETERIZATION  CARDIAC CATHETERIZATION 12/21/2022  Conclusion HEMODYNAMICS: RA:   8 mmHg (mean) RV:   83/7 mmHg PA:   79/34 mmHg (51 mean) PCWP:  25 mmHg (mean)  Estimated Fick CO/CI   3.3 L/min, 1.3 L/min/m2 Thermodilution CO/CI   4.9 L/min, 1.9 L/min/m2 NIBP: 125/88  TPG    26  mmHg PVR     5.3-8.8 Wood Units PAPi      5.6   IMPRESSION: Moderately elevated pre and post capillary filling pressures. 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 Severely reduced cardiac output / cardiac  index by Fick & Thermodilution (TD likely more reflective of true output).  RECOMMENDATIONS: CPX to complete evaluation for LVAD.  Aditya Sabharwal Advanced Heart Failure 10:58 AM   STRESS TESTS  NM MYOCAR MULTI W/SPECT W 07/03/2021  Narrative   Findings are consistent with prior anteroseptal and inferolateral myocardial infarction. There is no current ischemia. The study is high risk. Risk based on scar and decreased LVEF, consider correlating LVEF with echo.   No ST deviation was noted. Converted to pharmacological stress test due to frequent ventricular ectopy with exercise. PVCs, bigeminy, and accelerated idioventricular rhythm noted.   LV perfusion is abnormal.   Left ventricular function is abnormal. Nuclear stress EF: 22 %. The left ventricular ejection fraction is severely decreased (<30%). End systolic cavity size is severely enlarged.   ECHOCARDIOGRAM  ECHOCARDIOGRAM LIMITED 07/10/2024  Narrative ECHOCARDIOGRAM LIMITED REPORT    Patient Name:   James [name] Date of Exam: 07/10/2024 Medical Rec #:  [id number]  Height:       78.0 in Accession #:    [id number]        Weight:       259.3 lb Date of Birth:  [birth year]         BSA:          2.521 m Patient Age:    63 years          BP:           124/83 mmHg Patient Gender: M                 HR:           65 bpm. Exam Location:  [facility]  Procedure: Intracardiac Opacification Agent, Cardiac Doppler, Limited Color Doppler and Limited Echo (Both Spectral and Color Flow Doppler were utilized during procedure).  Indications:    Congestive Heart Failure I50.9  History:        Patient has prior history of Echocardiogram examinations, most recent 05/14/2024.  Sonographer:    Koleen Popper RDCS Referring Phys: [id number] DORN DAWSON   Sonographer Comments: Image acquisition challenging due to patient body habitus. IMPRESSIONS   1. Left ventricular ejection fraction, by estimation, is 10-15%. The left  ventricle demonstrates global hypokinesis. The left ventricular internal cavity size was severely dilated. Left ventricular diastolic parameters are indeterminate. 2. Right ventricular systolic function mild to moderately reduced. The right ventricular size is mildly enlarged. There is moderately elevated pulmonary artery systolic pressure. 3. Moderate mitral valve regurgitation. No evidence of mitral stenosis. 4. The tricuspid valve is abnormal. Tricuspid valve regurgitation is moderate. 5. The inferior vena cava is dilated in size with <50% respiratory variability, suggesting right atrial pressure of 15 mmHg. 6. Limited echo evaluate LV and RV function.  FINDINGS Left Ventricle: Left ventricular ejection fraction, by estimation, is 10-15%. The left ventricle demonstrates global hypokinesis. Definity  contrast agent was given IV to delineate the left ventricular endocardial borders. The left ventricular internal cavity size was severely dilated. Left ventricular diastolic parameters are indeterminate.  Right Ventricle: The right ventricular size is mildly enlarged. Right vetricular wall thickness was not well visualized. Right ventricular systolic function mild to moderately reduced. There is moderately elevated pulmonary artery systolic pressure. The tricuspid regurgitant velocity is 3.21 m/s, and with an assumed right atrial pressure of 15 mmHg, the estimated right ventricular systolic pressure is 56.2 mmHg.  Mitral Valve: Moderate mitral valve regurgitation. No evidence of mitral valve stenosis.  Tricuspid Valve: The tricuspid valve is abnormal. Tricuspid valve regurgitation is moderate . No evidence of tricuspid stenosis.  Aortic Valve: Aortic valve mean gradient measures 1.9 mmHg. Aortic valve peak gradient measures 3.2 mmHg. Aortic valve area, by VTI measures 2.72 cm.  Venous: The inferior vena cava is dilated in size with less than 50% respiratory variability, suggesting right atrial  pressure of 15 mmHg.  Additional Comments: Spectral Doppler performed. Color Doppler performed.  LEFT VENTRICLE PLAX 2D LVIDd:         7.10 cm LVIDs:         6.90 cm LV PW:         0.80 cm LV IVS:        0.80 cm LVOT diam:     2.60 cm LV SV:         37 LV SV Index:   15 LVOT Area:     5.31 cm   RIGHT VENTRICLE         IVC TAPSE (M-mode): 1.2  cm  IVC diam: 3.80 cm  LEFT ATRIUM              Index LA diam:        5.10 cm  2.02 cm/m LA Vol (A2C):   132.0 ml 52.37 ml/m LA Vol (A4C):   106.0 ml 42.05 ml/m LA Biplane Vol: 123.0 ml 48.79 ml/m AORTIC VALVE AV Area (Vmax):    3.10 cm AV Area (Vmean):   2.52 cm AV Area (VTI):     2.72 cm AV Vmax:           89.84 cm/s AV Vmean:          67.270 cm/s AV VTI:            0.137 m AV Peak Grad:      3.2 mmHg AV Mean Grad:      1.9 mmHg LVOT Vmax:         52.40 cm/s LVOT Vmean:        31.900 cm/s LVOT VTI:          0.070 m LVOT/AV VTI ratio: 0.51  AORTA Ao Root diam: 4.60 cm  MR Peak grad: 74.6 mmHg   TRICUSPID VALVE MR Vmax:      432.00 cm/s TR Peak grad:   41.2 mmHg TR Vmax:        321.00 cm/s  SHUNTS Systemic VTI:  0.07 m Systemic Diam: 2.60 cm  Dorn Ross MD Electronically signed by Dorn Ross MD Signature Date/Time: 07/10/2024/4:18:20 PM    Final   TEE  ECHO TEE 12/21/2022  Narrative TRANSESOPHOGEAL ECHO REPORT    Patient Name:   James [name] Date of Exam: 12/21/2022 Medical Rec #:  [id number]         Height:       79.0 in Accession #:    [id number]        Weight:       253.0 lb Date of Birth:  03-Nov-1960         BSA:          2.518 m Patient Age:    61 years          BP:           115/76 mmHg Patient Gender: M                 HR:           83 bpm. Exam Location:  Inpatient  Procedure: Transesophageal Echo, 3D Echo and Color Doppler  Indications:     Aortic Root Aneurysm  History:         Patient has prior history of Echocardiogram examinations, most recent 09/11/2022. CHF; Risk  Factors:Hypertension.  Sonographer:     Lauraine Pilot RDCS Referring Phys:  [id number] RIA COMMANDER Diagnosing Phys: RIA COMMANDER  PROCEDURE: After discussion of the risks and benefits of a TEE, an informed consent was obtained from the patient. The transesophogeal probe was passed without difficulty through the esophogus of the patient. Sedation performed by different physician. The patient was monitored while under deep sedation. Anesthestetic sedation was provided intravenously by Anesthesiology: 428.18mg  of Propofol , 140mg  of Lidocaine . The patient's vital signs; including heart rate, blood pressure, and oxygen  saturation; remained stable throughout the procedure. The patient developed no complications during the procedure.  IMPRESSIONS   1. Left ventricular ejection fraction, by estimation, is 20 to 25%. The left ventricle has severely decreased function. The left ventricular internal cavity size was mildly to moderately  dilated. 2. Right ventricular systolic function is mildly reduced. The right ventricular size is normal. 3. Left atrial size was moderately dilated. No left atrial/left atrial appendage thrombus was detected. 4. The mitral valve is normal in structure. Trivial mitral valve regurgitation. 5. The aortic valve is normal in structure. Aortic valve regurgitation is not visualized. 6. The aortic root appears aneurysmal at the level of the sinuses of Valsalva (near the RCC) measuring up to 4.7cm. There is no aortic regurgitation.  FINDINGS Left Ventricle: Left ventricular ejection fraction, by estimation, is 20 to 25%. The left ventricle has severely decreased function. The left ventricular internal cavity size was mildly to moderately dilated.  Right Ventricle: The right ventricular size is normal. No increase in right ventricular wall thickness. Right ventricular systolic function is mildly reduced.  Left Atrium: Left atrial size was moderately dilated. No left  atrial/left atrial appendage thrombus was detected.  Right Atrium: Right atrial size was normal in size.  Pericardium: There is no evidence of pericardial effusion.  Mitral Valve: The mitral valve is normal in structure. Trivial mitral valve regurgitation.  Tricuspid Valve: The tricuspid valve is normal in structure. Tricuspid valve regurgitation is trivial.  Aortic Valve: The aortic valve is normal in structure. Aortic valve regurgitation is not visualized.  Pulmonic Valve: The pulmonic valve was normal in structure. Pulmonic valve regurgitation is trivial.  Aorta: There is an aneurysm involving the aortic root measuring 47 mm.  IAS/Shunts: No atrial level shunt detected by color flow Doppler.  Additional Comments: A device lead is visualized.  Aditya Sabharwal Electronically signed by Ria Commander Signature Date/Time: 01/01/2023/2:17:24 PM    Final        ______________________________________________________________________________________________     EKG: NSR I have independently reviewed the above radiologic studies and discussed with the patient   Recent Lab Findings: Lab Results  Component Value Date   WBC 10.4 07/15/2024   HGB 15.4 07/15/2024   HCT 45.8 07/15/2024   PLT 155 07/15/2024   GLUCOSE 135 (H) 07/15/2024   CHOL 134 07/10/2024   TRIG 93 07/10/2024   HDL 33 (L) 07/10/2024   LDLCALC 82 07/10/2024   ALT 673 (H) 07/15/2024   AST 113 (H) 07/15/2024   NA 134 (L) 07/15/2024   K 3.7 07/15/2024   CL 92 (L) 07/15/2024   CREATININE 1.38 (H) 07/15/2024   BUN 29 (H) 07/15/2024   CO2 33 (H) 07/15/2024   TSH 1.794 07/10/2024   INR 1.4 (H) 07/14/2024   HGBA1C 6.0 (H) 07/10/2024      Assessment / Plan:   James [name] is a pleasant 63 year old man with non-ischemic cardiomyopathy who was admitted from [facility] with acute on chronic decompensated heart failure.  He has previously been worked up for LVAD but workup was put on hold when he was diagnosed with  prostate cancer . Now that his cancer has been treated, he is resuming work up.  He strongly prefers transplant over LVAD, but understand given his cancer history that he may not be a candidate at this time.  Dr. Bensimhon has reached out to [facility] re: transplant workup, and the plan is to attempt milrinone wean.  If unable to, will go home on milrinone with outpatient follow-up with [facility].  If he is deemed not a transplant candidate, then he will continue with VAD work up.  He is overall fit and seems to be a good candidate for surgery.  He has not had previous chest surgery or radiation.  Of  note, his ECHOs have demonstrated an enlarged aortic root (ranging from 4.6-4.7 cm).  This may need to be more clearly characterized with CT scan if we continue to plan for LVAD - he does have CT PE from 07/22/22 which shows aortic root size of 4.5 cm.  He also has moderate TR which I think we would leave alone unless it was noted to be severe in the OR.  He has no AI.  I explained to him the specifics regarding LVAD implantation and what to expect in terms of post-op recovery.  We discussed the risks and benefits of undergoing LVAD implantation.  He expressed understanding and all questions/concerns were addressed.  I  spent 30 minutes counseling the patient face to face.   Con RAMAN Keian Odriscoll 07/15/2024 3:38 PM

## 2024-07-15 NOTE — Plan of Care (Signed)

## 2024-07-15 NOTE — Progress Notes (Addendum)
 Patient ID: James [name], male   DOB: [birth year], 63 y.o.   MRN: [id number]     Advanced Heart Failure Rounding Note  Cardiologist: Maude Emmer, MD  Chief Complaint: CHF Subjective:    Co-ox 61 on milrinone 0.125, eFick 4.9/2.1 Net -2.2L with Lasix  80 mg IV sCr down to 1.38  Up in chair, feeling fine this morning. No SOB, CP.   Objective:    Weight Range: 108.2 kg Body mass index is 27.57 kg/m.   Vital Signs:   Temp:  [97.9 F (36.6 C)-98.8 F (37.1 C)] 97.9 F (36.6 C) (10/01 0346) Pulse Rate:  [60-77] 77 (10/01 1000) Resp:  [9-22] 19 (10/01 1000) BP: (104-142)/(67-97) 123/85 (10/01 1000) SpO2:  [95 %-100 %] 99 % (10/01 1000) Weight:  [108.2 kg] 108.2 kg (10/01 0701) Last BM Date : 07/12/24  Weight change: Filed Weights   07/13/24 0500 07/14/24 0600 07/15/24 0701  Weight: 111.4 kg 112.8 kg 108.2 kg   Intake/Output:  Intake/Output Summary (Last 24 hours) at 07/15/2024 1052 Last data filed at 07/15/2024 0900 Gross per 24 hour  Intake 275.74 ml  Output 2925 ml  Net -2649.26 ml    Physical Exam    General: Well appearing. No distress on RA Cardiac: JVP flat.  S1 and S2 present. No murmurs Resp: Lung sounds clear and equal B/L Extremities: Warm and dry.  No peripheral edema.  Neuro: Alert and oriented x3. Affect pleasant. Moves all extremities without difficulty.  Telemetry   SR 80, runs of 2-4 beats NSVT, PVC burden <10 (personally reviewed)  Labs    CBC Recent Labs    07/14/24 0421 07/15/24 0415  WBC 9.9 10.4  HGB 15.5 15.4  HCT 45.5 45.8  MCV 94.2 95.0  PLT 158 155   Basic Metabolic Panel Recent Labs    90/69/74 0421 07/15/24 0415  NA 133* 134*  K 3.6 3.7  CL 86* 92*  CO2 32 33*  GLUCOSE 171* 135*  BUN 32* 29*  CREATININE 1.44* 1.38*  CALCIUM  8.5* 8.6*  MG 2.5* 2.5*   Liver Function Tests Recent Labs    07/14/24 0421 07/15/24 0415  AST 178* 113*  ALT 874* 673*  ALKPHOS 136* 124  BILITOT 2.3* 3.8*  PROT 6.8 6.8  ALBUMIN  3.5 3.4*   BNP (last 3 results) Recent Labs    06/03/24 0933 06/10/24 1213 07/09/24 1649  BNP 795.1* 1,071.5* 3,316.0*   Medications:    Scheduled Medications:  apixaban   5 mg Oral BID   Chlorhexidine  Gluconate Cloth  6 each Topical Daily   dapagliflozin  propanediol  10 mg Oral Daily   digoxin  0.125 mg Oral Daily   insulin  aspart  0-5 Units Subcutaneous QHS   insulin  aspart  0-9 Units Subcutaneous TID WC   mexiletine  250 mg Oral Q12H   potassium chloride   40 mEq Oral Daily   potassium chloride   40 mEq Oral Once   sodium chloride  flush  3 mL Intravenous Q12H   spironolactone   25 mg Oral Daily   torsemide   40 mg Oral Daily    Infusions:  milrinone 0.125 mcg/kg/min (07/15/24 0935)    PRN Medications: acetaminophen , albuterol , alum & mag hydroxide-simeth, melatonin, ondansetron  (ZOFRAN ) IV, mouth rinse, polyethylene glycol, sodium chloride  flush  Assessment/Plan   1. Acute on chronic systolic CHF/Cardiogenic shock: Shock resolved. Long history of nonischemic cardiomyopathy. Has Biotronik ICD.  History of low output on RHC and poor CPX but with minimal symptoms in the past.  Considering advanced  therapies, has preferred transplant.  No smoking since 09/15/23.  Has controlled prostate cancer.  He was admitted to [facility] with volume overload and signs of low output HF AKI on CKD stage 3 and elevated lactate. Started on Milrinone and Lasix  gtt.  - Co-ox 61%, CVP 7 - start Torsemide  40 mg daily - continue digoxin 0.125 mcg/kg/min - continue dapagliflozin  10 mg daily - continue spiro 25 mg daily - start hydral 25 mg tid, will hold on ARB with renal function, can switch if continues to improve - Beta blocker on hold with low output/CGS.  - Will need ongoing workup for advanced therapies. Dr. Dariya Gainer has discussed with [facility], will plan to attempt milrinone wean. If unable to wean, will plan for home milrinone with close follow [facility]. If he is not transplant candidate,  we will continue with plan for CAD.   2. AKI on CKD stage 3: Creatinine improving with diuresis  Suspect cardiorenal. Cr 2.3 >2.17 > 1.78 > 1.44  3. PVCs/NSVT: Frequent on milrinone. Significantly improved with adding mexilitine. PVC burden down to 5-10/min, with regular runs of 2-4 NSVT runs.  - continue mexilitine 200 mg tid  4. Atrial fibrillation: Paroxysmal. NSR currently.  - Continue Eliquis . Does not need aspirin .   5. Prostate cancer: completed EBRT 4/25. Completed ST ADT.   6. Meningioma. Noted on CT 5/25, seen neurosx. CT scan this admission stable size.   7. Elevated LFTs: In setting of cardiogenic shock. Slowly improving.  - Follow LFTs.   8. Hypokalemia: resolved  Will plan to transfer to the floor today.   Length of Stay: 6  [name], NP  07/15/2024, 10:52 AM  Advanced Heart Failure Team Pager [phone] (M-F; 7a - 5p)  Please contact [facility] for night-coverage after hours (5p -7a ) and weekends on [email or web address]  Patient seen and examined with the above-signed Advanced Practice Provider and/or Housestaff. I personally reviewed laboratory data, imaging studies and relevant notes. I independently examined the patient and formulated the important aspects of the plan. I have edited the note to reflect any of my changes or salient points. I have personally discussed the plan with the patient and/or family.  Remains on milrinone. Feels good. PVC burden much improved with addition of mexilitene. SCr improved  This afternoon co-ox dropped to 50% after milrinone dropped to 0.125.   General: Sitting in chair No resp difficulty HEENT: normal Neck: supple. no JVP 7. Carotids 2+ bilat; no bruits. No lymphadenopathy or thryomegaly appreciated. Cor: PMI nondisplaced. Regular rate & rhythm. No rubs, gallops or murmurs. Lungs: clear Abdomen: soft, nontender, nondistended. No hepatosplenomegaly. No bruits or masses. Good bowel sounds. Extremities: no cyanosis, clubbing, rash,  edema Neuro: alert & orientedx3, cranial nerves grossly intact. moves all 4 extremities w/o difficulty. Affect pleasant  Has failed milrinone wean. Will plan to send home on IV milrinone and proceed with transplant w/u at [facility] as outpatient. Will be improtant to watch for potential LV recovery with PVC suppression.  Toribio Fuel, MD  11:58 PM

## 2024-07-15 NOTE — Consult Note (Signed)
 Consultation Note Date: 07/15/2024   Patient Name: James [name]  DOB: 1961/09/23  MRN: [id number]  Age / Sex: 63 y.o., male  PCP: Bevely Doffing, FNP Referring Physician: Cherrie Toribio SAUNDERS, MD  Reason for Consultation: VAD evaluation  HPI/Patient Profile: 63 y.o. male  with past medical history of NICM, HFrEF, s/p Biotronik ICD, paroxysmal Afib/flutter on anticoagulation, NSVT, aortic root aneurysm, CKD stage 3a, HTN, HLD, diabetes, OSA, former smoker, prostate CA, small meningioma admitted on 07/09/2024 with dyspnea and worsening edema with cardiogenic shock. VAD work up ongoing.   Clinical Assessment and Goals of Care: Consult received and chart review completed. I met today with Garrel. No family at bedside. Garrel was welcoming of conversation. He is knowledgeable about LVAD process, care, and responsibility. He is hopeful to pursue transplant but is open to LVAD if transplant is not an option or if he requires assistance before transplant is an option for him.   He is very motivated to continue to live. He has continued to work and has a goal to return to work. He has 4 children and grandchildren to live for. He enjoys ATVs with his family as well as hunting deer and rabbit. He likes to be active and is interested in intervention to prolong life as well as give him quality of life. He has good family support from his wife and children - his daughter is a NT here at [facility]. He has much extended family locally as well.   We reviewed the importance of quality of life in relation to his wishes and goals. We discussed risks of LVAD surgery. We discussed any limitations or hard stops of interventions that he would not desire as he does not see that as giving him acceptable quality of life. He is open to all interventions if there is hope of improvement. He would not desire long term ventilation, dialysis, or feeding  tube. He shares his experience when his father died and taking him off ventilator to allow peaceful end of life. He shares that his mother died recently but shares fondly that she was a wonderful woman and lived a good life. These experiences has impacted what he would desire for himself. He has a good understanding of what he would want and not want. I encouraged that it will be importance for him to discuss these wishes with his family as well as complete Advance Directive documentation. He is open to completing and we will review further tomorrow.   Garrel asks very good and insightful questions regarding LVAD support. He looks forward to learning more and meeting LVAD patient.   All questions/concerns addressed. Emotional support provided.   Primary Decision Maker PATIENT    SUMMARY OF RECOMMENDATIONS   - Hopeful for transplant but open to LVAD if necessary to prolong life - Will discuss Advance Directive documentation more in detail tomorrow - Hopeful for more time as well as improved quality of life - Good candidate for LVAD from palliative perspective  Code Status/Advance Care Planning: Full code  Symptom Management:  Per heart failure  Prognosis:  Overall prognosis poor in the absence of advanced therapies with advanced heart failure.   Discharge Planning: To Be Determined      Primary Diagnoses: Present on Admission:  NSVT (nonsustained ventricular tachycardia)  Acute on chronic combined systolic and diastolic congestive heart failure (HCC)   I have reviewed the medical record, interviewed the patient and family, and examined the patient. The following aspects are pertinent.  Past Medical History:  Diagnosis Date   CKD (chronic kidney disease) stage 2, GFR 60-89 ml/min    COPD (chronic obstructive pulmonary disease) (HCC)    Diabetes mellitus type II, non insulin  dependent (HCC)    Dilated aortic root    Elevated PSA    Hypercholesteremia    Hypertension    NICM  (nonischemic cardiomyopathy) (HCC)    NSVT (nonsustained ventricular tachycardia) (HCC)    Obstructive sleep apnea    Paroxysmal atrial fibrillation (HCC)    Pulmonary hypertension (HCC)    PVC's (premature ventricular contractions)    Tobacco abuse    Social History   Socioeconomic History   Marital status: Married    Spouse name: Rashaud Ybarbo   Number of children: 4   Years of education: Not on file   Highest education level: Associate degree: occupational, Scientist, product/process development, or vocational program  Occupational History   Occupation: Merchandiser, retail    Comment: [employer]  Tobacco Use   Smoking status: Former    Current packs/day: 0.00    Average packs/day: 0.5 packs/day for 38.8 years (19.4 ttl pk-yrs)    Types: Cigarettes    Start date: 10/15/1982    Quit date: 07/18/2021    Years since quitting: 2.9   Smokeless tobacco: Never   Tobacco comments:    3 cigaretts a day  Vaping Use   Vaping status: Never Used  Substance and Sexual Activity   Alcohol use: No    Alcohol/week: 0.0 standard drinks of alcohol   Drug use: No   Sexual activity: Yes    Birth control/protection: None  Other Topics Concern   Not on file  Social History Narrative   Divorced   No regular exercise   Social Drivers of Health   Financial Resource Strain: Low Risk  (07/21/2021)   Overall Financial Resource Strain (CARDIA)    Difficulty of Paying Living Expenses: Not very hard  Food Insecurity: No Food Insecurity (07/09/2024)   Hunger Vital Sign    Worried About Running Out of Food in the Last Year: Never true    Ran Out of Food in the Last Year: Never true  Transportation Needs: No Transportation Needs (07/09/2024)   PRAPARE - Administrator, Civil Service (Medical): No    Lack of Transportation (Non-Medical): No  Physical Activity: Not on file  Stress: Not on file  Social Connections: Not on file   Family History  Problem Relation Age of Onset   Coronary artery disease Mother     Diabetes Father    Heart attack Father    Hypertension Father    Iron deficiency Father    Thyroid  disease Sister    Cardiomyopathy Brother        No significant coronary disease by coronary angiography   Heart disease Maternal Grandfather        Also paternal grandfather   Sudden death Other    Colon cancer Neg Hx    Scheduled Meds:  apixaban   5 mg Oral BID   Chlorhexidine   Gluconate Cloth  6 each Topical Daily   colchicine  0.6 mg Oral Daily   dapagliflozin  propanediol  10 mg Oral Daily   digoxin  0.125 mg Oral Daily   hydrALAZINE   25 mg Oral Q8H   insulin  aspart  0-5 Units Subcutaneous QHS   insulin  aspart  0-9 Units Subcutaneous TID WC   mexiletine  200 mg Oral Q8H   potassium chloride   40 mEq Oral Daily   sodium chloride  flush  3 mL Intravenous Q12H   spironolactone   25 mg Oral Daily   torsemide   40 mg Oral Daily   Continuous Infusions:  milrinone 0.125 mcg/kg/min (07/15/24 0935)   PRN Meds:.acetaminophen , albuterol , alum & mag hydroxide-simeth, melatonin, ondansetron  (ZOFRAN ) IV, mouth rinse, polyethylene glycol, sodium chloride  flush No Known Allergies Review of Systems  Constitutional:  Positive for activity change, appetite change and fatigue.  Respiratory:  Negative for shortness of breath.     Physical Exam Vitals and nursing note reviewed.  Constitutional:      General: He is not in acute distress. Cardiovascular:     Rate and Rhythm: Normal rate.  Abdominal:     General: Abdomen is flat.     Palpations: Abdomen is soft.  Neurological:     Mental Status: He is alert and oriented to person, place, and time.     Vital Signs: BP (!) 115/98   Pulse 76   Temp 98.7 F (37.1 C) (Oral)   Resp 14   Ht 6' 6 (1.981 m)   Wt 108.2 kg   SpO2 94%   BMI 27.57 kg/m  Pain Scale: 0-10   Pain Score: 0-No pain   SpO2: SpO2: 94 % O2 Device:SpO2: 94 % O2 Flow Rate: .O2 Flow Rate (L/min): 2 L/min  IO: Intake/output summary:  Intake/Output Summary (Last 24  hours) at 07/15/2024 1413 Last data filed at 07/15/2024 1231 Gross per 24 hour  Intake 653.73 ml  Output 3275 ml  Net -2621.27 ml    LBM: Last BM Date : 07/12/24 Baseline Weight: Weight: 122.5 kg Most recent weight: Weight: 108.2 kg     Palliative Assessment/Data:    Time Total: 80 min  Greater than 50%  of this time was spent counseling and coordinating care related to the above assessment and plan.  Signed by: Bernarda Kitty, NP Palliative Medicine Team Pager # [phone] (M-F 8a-5p) Team Phone # [phone] (Nights/Weekends)

## 2024-07-16 DIAGNOSIS — I5041 Acute combined systolic (congestive) and diastolic (congestive) heart failure: Secondary | ICD-10-CM | POA: Diagnosis not present

## 2024-07-16 LAB — CBC
HCT: 47.3 % (ref 39.0–52.0)
Hemoglobin: 16.1 g/dL (ref 13.0–17.0)
MCH: 32.1 pg (ref 26.0–34.0)
MCHC: 34 g/dL (ref 30.0–36.0)
MCV: 94.4 fL (ref 80.0–100.0)
Platelets: 157 K/uL (ref 150–400)
RBC: 5.01 MIL/uL (ref 4.22–5.81)
RDW: 14.7 % (ref 11.5–15.5)
WBC: 9.1 K/uL (ref 4.0–10.5)
nRBC: 0 % (ref 0.0–0.2)

## 2024-07-16 LAB — CULTURE, BLOOD (ROUTINE X 2)
Culture: NO GROWTH
Special Requests: ADEQUATE

## 2024-07-16 LAB — COOXEMETRY PANEL
Carboxyhemoglobin: 2.2 % — ABNORMAL HIGH (ref 0.5–1.5)
Methemoglobin: <0.7 % (ref 0.0–1.5)
O2 Saturation: 62.5 %
Total hemoglobin: 16.3 g/dL — ABNORMAL HIGH (ref 12.0–16.0)

## 2024-07-16 LAB — GLUCOSE, CAPILLARY
Glucose-Capillary: 146 mg/dL — ABNORMAL HIGH (ref 70–99)
Glucose-Capillary: 191 mg/dL — ABNORMAL HIGH (ref 70–99)
Glucose-Capillary: 201 mg/dL — ABNORMAL HIGH (ref 70–99)
Glucose-Capillary: 168 mg/dL — ABNORMAL HIGH (ref 70–99)

## 2024-07-16 LAB — RENAL FUNCTION PANEL
Albumin: 3.5 g/dL (ref 3.5–5.0)
Anion gap: 12 (ref 5–15)
BUN: 37 mg/dL — ABNORMAL HIGH (ref 8–23)
CO2: 32 mmol/L (ref 22–32)
Calcium: 8.9 mg/dL (ref 8.9–10.3)
Chloride: 89 mmol/L — ABNORMAL LOW (ref 98–111)
Creatinine, Ser: 1.67 mg/dL — ABNORMAL HIGH (ref 0.61–1.24)
GFR, Estimated: 46 mL/min — ABNORMAL LOW (ref >60)
Glucose, Bld: 127 mg/dL — ABNORMAL HIGH (ref 70–99)
Phosphorus: 3.9 mg/dL (ref 2.5–4.6)
Potassium: 3.8 mmol/L (ref 3.5–5.1)
Sodium: 133 mmol/L — ABNORMAL LOW (ref 135–145)

## 2024-07-16 LAB — MAGNESIUM: Magnesium: 2.4 mg/dL (ref 1.7–2.4)

## 2024-07-16 LAB — VAS US DOPPLER PRE VAD
Left ABI: 0.75
Right ABI: 0.95

## 2024-07-16 MED ORDER — POTASSIUM CHLORIDE CRYS ER 20 MEQ PO TBCR
40.0000 meq | EXTENDED_RELEASE_TABLET | Freq: Once | ORAL | Status: AC
  Administered 2024-07-16: 40 meq via ORAL
  Filled 2024-07-16: qty 2

## 2024-07-16 MED ORDER — HYDRALAZINE HCL 50 MG PO TABS
50.0000 mg | ORAL_TABLET | Freq: Three times a day (TID) | ORAL | Status: DC
  Administered 2024-07-16 – 2024-07-17 (×4): 50 mg via ORAL
  Filled 2024-07-16 (×4): qty 1

## 2024-07-16 MED ORDER — AMIODARONE HCL 200 MG PO TABS
200.0000 mg | ORAL_TABLET | Freq: Two times a day (BID) | ORAL | Status: DC
  Administered 2024-07-16 – 2024-07-17 (×3): 200 mg via ORAL
  Filled 2024-07-16 (×3): qty 1

## 2024-07-16 NOTE — Plan of Care (Signed)
 Palliative:  I came to discuss further with James [name] regarding Advance Directive. He is sleeping in recliner. I did not awaken at this time. I will ask my colleague to follow up as able.   No charge  James Kitty, NP Palliative Medicine Team Pager [phone] (Please see [email or web address] for schedule) Team Phone [phone]

## 2024-07-16 NOTE — TOC Progression Note (Addendum)
 Transition of Care [facility]) - Progression Note    Patient Details  Name: James [name] MRN: [id number] Date of Birth: 12/15/1960  [facility] CM/SW Contact  James KANDICE Stain, RN Phone Number: 07/16/2024, 2:41 PM  Clinical Narrative:    James [name] with [facility] in regards to patient needing lifevest. This RNCM emailed the require documents. James [name] with [employer] notified of Milrinone drip need at discharge.   1611 patient no longer need the lifevest. Notified [name].  Expected Discharge Plan: Home/Self Care Barriers to Discharge: Continued Medical Work up               Expected Discharge Plan and Services In-house Referral: Clinical Social Work Discharge Planning Services: CM Consult   Living arrangements for the past 2 months: Single Family Home                                       Social Drivers of Health (SDOH) Interventions SDOH Screenings   Food Insecurity: No Food Insecurity (07/09/2024)  Housing: Low Risk  (07/09/2024)  Transportation Needs: No Transportation Needs (07/09/2024)  Utilities: Not At Risk (07/09/2024)  Alcohol Screen: Low Risk  (11/18/2023)  Depression (PHQ2-9): Low Risk  (03/24/2024)  Financial Resource Strain: Low Risk  (07/21/2021)  Tobacco Use: Medium Risk (07/09/2024)    Readmission Risk Interventions    07/10/2024   11:05 AM 10/10/2023   10:37 AM  Readmission Risk Prevention Plan  Transportation Screening Complete Complete  Home Care Screening Complete Complete  Medication Review (RN CM) Complete Complete

## 2024-07-16 NOTE — Progress Notes (Signed)
 Palliative Medicine Progress Note   Patient Name: James [name]       Date: 07/16/2024 DOB: Dec 18, 1960  Age: 63 y.o. MRN#: [id number] Attending Physician: Cherrie Toribio SAUNDERS, MD Primary Care Physician: Bevely Doffing, FNP Admit Date: 07/09/2024  Reason for Consultation/Follow-up: {Reason for Consult:23484}  HPI/Patient Profile: 63 y.o. male  with past medical history of NICM, HFrEF, s/p Biotronik ICD, paroxysmal Afib/flutter on anticoagulation, NSVT, aortic root aneurysm, CKD stage 3a, HTN, HLD, diabetes, OSA, former smoker, prostate CA, small meningioma admitted on 07/09/2024 with dyspnea and worsening edema with cardiogenic shock. VAD work up ongoing.   Subjective: Chart reviewed. Update received from RN. I met with James [name] today as a follow-up for palliative needs. He has no new complaints and states he feels good today. He remains hopeful to pursue transplant  I provided education on advanced directives. Discussed the importance of designating a healthcare agent to make medical decisions for him in the event he is not able to make those decisions for himself.  James [name] states he would want to designate his wife as primary healthcare agent, and his daughter James [name] as alternate.  Objective:  Physical Exam Vitals reviewed.  Constitutional:      General: He is not in acute distress. Cardiovascular:     Rate and Rhythm: Normal rate.  Pulmonary:     Effort: Pulmonary effort is normal.  Neurological:     Mental Status: He is alert and oriented to person, place, and time.  Psychiatric:     Comments: Pleasant              Palliative Medicine Assessment & Plan   Assessment: Principal Problem:   Acute congestive heart failure (HCC) Active Problems:   Acute on chronic combined systolic and  diastolic congestive heart failure (HCC)   NSVT (nonsustained ventricular tachycardia)   Acute renal failure superimposed on chronic kidney disease   Elevated troponin    Recommendations/Plan: - Hopeful for transplant but open to LVAD if necessary to prolong life - Hopeful for more time as well as improved quality of life - Good candidate for LVAD from palliative perspective - spiritual care referral to assist with completing advanced directives    Code Status: Full code   Prognosis:  {Palliative Care Prognosis:23504}  Discharge Planning: {Palliative dispostion:23505}  Care plan was discussed with ***  Thank you for allowing the Palliative Medicine Team to assist in the care of this patient.   ***   Recardo KATHEE Loll, NP   Please contact Palliative Medicine Team phone at [phone] for questions and concerns.  For individual providers, please see AMION.

## 2024-07-16 NOTE — Progress Notes (Addendum)
 Patient ID: James [name], male   DOB: 09/25/1961, 63 y.o.   MRN: [id number]     Advanced Heart Failure Rounding Note  Cardiologist: Maude Emmer, MD  Chief Complaint: CHF Subjective:    Failed milrinone wean 10/1. Having increasing short NSVT runs on tele.   Co-ox 63 on milrinone 0.25.  Net -2.5L sCr 1.44 >1.38>1.67  Sitting up in bed. Feeling well this morning. No SOB, CP, or dizziness.   Objective:    Weight Range: 109.3 kg Body mass index is 27.84 kg/m.   Vital Signs:   Temp:  [97.7 F (36.5 C)-98.6 F (37 C)] 98 F (36.7 C) (10/02 1112) Pulse Rate:  [51-94] 84 (10/02 1112) Resp:  [14-20] 18 (10/02 1112) BP: (112-137)/(76-103) 134/89 (10/02 1112) SpO2:  [91 %-98 %] 96 % (10/02 1112) Weight:  [109.3 kg] 109.3 kg (10/02 0600) Last BM Date : 07/15/24  Weight change: Filed Weights   07/14/24 0600 07/15/24 0701 07/16/24 0600  Weight: 112.8 kg 108.2 kg 109.3 kg   Intake/Output:  Intake/Output Summary (Last 24 hours) at 07/16/2024 1204 Last data filed at 07/16/2024 1016 Gross per 24 hour  Intake 1168.04 ml  Output 2350 ml  Net -1181.96 ml    Physical Exam    General: Well appearing. No distress on RA Cardiac: JVP flat. S1 and S2 present. No murmurs Extremities: Warm and dry.  No peripheral edema.  Neuro: Alert and oriented x3. Affect pleasant. Moves all extremities without difficulty.  Telemetry   SR 80s, frequent 2-5 NSVT runs (personally reviewed)  Labs    CBC Recent Labs    07/15/24 0415 07/16/24 0231  WBC 10.4 9.1  HGB 15.4 16.1  HCT 45.8 47.3  MCV 95.0 94.4  PLT 155 157   Basic Metabolic Panel Recent Labs    89/98/74 0415 07/16/24 0231  NA 134* 133*  K 3.7 3.8  CL 92* 89*  CO2 33* 32  GLUCOSE 135* 127*  BUN 29* 37*  CREATININE 1.38* 1.67*  CALCIUM  8.6* 8.9  MG 2.5* 2.4  PHOS  --  3.9   Liver Function Tests Recent Labs    07/14/24 0421 07/15/24 0415 07/16/24 0231  AST 178* 113*  --   ALT 874* 673*  --   ALKPHOS 136* 124   --   BILITOT 2.3* 3.8*  --   PROT 6.8 6.8  --   ALBUMIN 3.5 3.4* 3.5   BNP (last 3 results) Recent Labs    06/03/24 0933 06/10/24 1213 07/09/24 1649  BNP 795.1* 1,071.5* 3,316.0*   Medications:    Scheduled Medications:  apixaban   5 mg Oral BID   Chlorhexidine  Gluconate Cloth  6 each Topical Daily   colchicine  0.6 mg Oral Daily   dapagliflozin  propanediol  10 mg Oral Daily   digoxin  0.125 mg Oral Daily   feeding supplement  237 mL Oral TID BM   hydrALAZINE   25 mg Oral Q8H   insulin  aspart  0-5 Units Subcutaneous QHS   insulin  aspart  0-9 Units Subcutaneous TID WC   mexiletine  200 mg Oral Q8H   multivitamin with minerals  1 tablet Oral Daily   potassium chloride   40 mEq Oral Daily   sodium chloride  flush  3 mL Intravenous Q12H   spironolactone   25 mg Oral Daily   torsemide   40 mg Oral Daily    Infusions:  milrinone 0.25 mcg/kg/min (07/16/24 1016)    PRN Medications: acetaminophen , albuterol , alum & mag hydroxide-simeth, melatonin, ondansetron  (ZOFRAN ) IV,  mouth rinse, polyethylene glycol, sodium chloride  flush  Assessment/Plan   1. Acute on chronic systolic CHF/Cardiogenic shock: Shock resolved. Long history of nonischemic cardiomyopathy. Has Biotronik ICD.  History of low output on RHC and poor CPX but with minimal symptoms in the past.  Considering advanced therapies, has preferred transplant.  No smoking since 09/15/23.  Has controlled prostate cancer.  He was admitted to [facility] with volume overload and signs of low output HF AKI on CKD stage 3 and elevated lactate. Started on Milrinone and Lasix  gtt.  - Co-ox 63%, CVP 5 - recieved Torsemide  40 mg daily; hold tomorrow - continue digoxin 0.125 mcg/kg/min - continue dapagliflozin  10 mg daily - continue spiro 25 mg daily - increase hydral to 50 mg tid, will hold on ARB with renal function, can switch if continues to improve - Beta blocker on hold with low output/CGS.  - Will need ongoing workup for advanced therapies.  Dr. Cleopha Indelicato has discussed with [facility]. Unable to wean milrinone, will plan for home milrinone with close follow up with [facility] transplant. If he is not transplant candidate, we will continue with plan for VAD.  - Concerned with frequency of NSVT on milrinone, will discuss home versus transfer to [facility]  2. AKI on CKD stage 3: Creatinine improving with diuresis  Suspect cardiorenal. Cr 2.3 >2.17 > 1.78 > 1.44 > 1.63  3. PVCs/NSVT: Frequent on milrinone. Significantly improved with adding mexilitine. PVC burden down to 5-10/min, with more frequent runs of 2-6b NSVT. - continue mexilitine 200 mg tid - start amio 200 mg bid - needs lifevest at discharge  4. Atrial fibrillation: Paroxysmal. NSR currently.  - Continue Eliquis . Does not need aspirin .   5. Prostate cancer: completed EBRT 4/25. Completed ST ADT.   6. Meningioma. Noted on CT 5/25, seen neurosx. CT scan this admission stable size.   7. Elevated LFTs: In setting of cardiogenic shock. Slowly improving.  - Follow LFTs.   8. Hypokalemia: resolved  Length of Stay: 7  [name], NP  07/16/2024, 12:04 PM  Advanced Heart Failure Team Pager [phone] (M-F; 7a - 5p)  Please contact [facility] for night-coverage after hours (5p -7a ) and weekends on [email or web address]  Patient seen and examined with the above-signed Advanced Practice Provider and/or Housestaff. I personally reviewed laboratory data, imaging studies and relevant notes. I independently examined the patient and formulated the important aspects of the plan. I have edited the note to reflect any of my changes or salient points. I have personally discussed the plan with the patient and/or family.  Failed milrinone wean. Now back on 0.25. Co-ox improved. Having frequent NSVT   Denies CP or SOB  General:  Sitting up in chair  Neck: supple. JVP 7 . Carotids 2+ bilat; no bruits. No lymphadenopathy or thryomegaly appreciated. Cor: PMI nondisplaced. Regular rate & rhythm. No rubs,  gallops or murmurs. Lungs: clear Abdomen: soft, nontender, nondistended. No hepatosplenomegaly. No bruits or masses. Good bowel sounds. Extremities: no cyanosis, clubbing, rash, edema Neuro: alert & orientedx3, cranial nerves grossly intact. moves all 4 extremities w/o difficulty. Affect pleasant  He will need home milrinone as bridge to transplant eval.  Add amio to mexilitene. Place PICC.   Keep K> 4.0 Mg> 2.0.   Toribio Fuel, MD  2:36 PM

## 2024-07-16 NOTE — Progress Notes (Signed)
 Mobility Specialist Progress Note;   07/16/24 0919  Mobility  Activity Ambulated with assistance  Level of Assistance Standby assist, set-up cues, supervision of patient - no hands on  Assistive Device Front wheel walker  Distance Ambulated (ft) 400 ft  Activity Response Tolerated well  Mobility Referral Yes  Mobility visit 1 Mobility  Mobility Specialist Start Time (ACUTE ONLY) 0919  Mobility Specialist Stop Time (ACUTE ONLY) [id number]  Mobility Specialist Time Calculation (min) (ACUTE ONLY) 19 min   Pt agreeable to mobility. Required no physical assistance during ambulation, SV for safety. VSS throughout, no c/o when asked. Requested to sit in chair at [facility]. Pt left in chair with all needs met, call bell in reach.   Lauraine Erm Mobility Specialist Please contact via SecureChat or [employer] [phone]

## 2024-07-16 NOTE — Progress Notes (Signed)
 LVAD Initial Psychosocial Screening  Date/Time Initiated:  07/15/24 at 9:45am Referral Source:  LVAD Coordinators Referral Reason:  LVAD Initial Psychosocial Screening Source of Information:  patient  Demographics Name:  James [name] Address:  [address] [name], [place] [address] Home phone: [phone]- mobile Marital Status: Married  Faith:  Christian Primary Language:  English Last 4 # SS: [id number]  DOB: 06/04/1961   Psychological Health Appearance:  Unremarkable Mental Status:  Alert, oriented Eye Contact:  Good Thought Content:  Coherent Speech:  Logical/coherent Mood:  Appropriate and Pleasant  Affect:  Appropriate to circumstance Insight:  Good Judgement: Unimpaired Interaction Style:  Engaged   Family/Social Information Who lives in your home? Name: James [name]  Relationship: Wife 3 dogs    Other family members/support persons in your life? Name: James [name]  Relationship: Daughter James [name]     Daughter  2 sons   Community Are you active with community agencies/resources/homecare? No  Are you active in a church, synagogue, mosque or other faith based community? Yes -baptist Are you active in any clubs or social organizations? Is a mason What do you do for fun?  Hobbies?  Interests? Loves to do thinks outdoors like hunt, fish, go boating, ride his ATV   Home Environment/Personal Care Do you have reliable phone service? Yes  If so, what is the number?  [phone] Do you own or rent your home? Pays a mortgage Number of steps into the home? 3 in one entrance and 6 at the others How many levels in the home? 1 Electrical needs for LVAD (3 prong outlets)? yes Second hand smoke exposure in the home? No- wife smokes but does it outside the apartment Travel distance from [place]?   Financial Information What is your source of income? Full time employment  Do you have difficulty meeting your monthly expenses? No Can you budget for the monthly cost  for dressing supplies post procedure? Yes   Some concerns about finances when he is unable to work after surgery.  Has FMLA and STD available through work and is starting to get that paperwork together for us  to fill out- if he can get this in place he would feel ok about not being able to work.  Wife also works full time running a [employer] but is also planning to take time from work post surgery.  Primary Health insurance:  [employer] commercial Have you ever had to refuse medication due to cost?  No Do you use mail order for your prescriptions?  No Have you applied for Medicaid?  no Have you applied for Social Security Disability/(SSI)  no   Education/Work Information What is the last grade of school you completed? 12th Do you have any problems with reading or writing?  No Preferred method of learning?  Hands on Are you currently employed?  Yes  Name of employer? 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  How long have you worked there? 38 years Did you serve in the [employer]? No     Advance Directives: Do you have a Living Will or Medical POA? No  Would you like to complete a Living Will and Medical POA prior to surgery?  Yes Have you had a consult with the Palliative Care Team at [facility]? No   Legal Do you currently have any legal issues/problems?  no Have you had any legal issues/problems in the past?  no  Medical Information Do you have any family history of heart problems? Yes- primarily on his moms side and his brothers Do you have a PCP or other medical provider? Yes Are you able to complete your ADL's?  yes Do you have any assistive devices in the home? no How are you currently managing your medications at home? How many hours do you sleep at night? Was having a  lot of trouble sleeping prior to hospital stay due to issues breathing How is your appetite? Had low appetite prior to hospital stay Do you smoke now or past usage? past usage    Quit date: 2024- feels very proud of his accomplishment and has not had the urge to smoke frequently Do you drink alcohol now or past usage? never   Are you currently using illegal drugs or misuse of medication or past usage? never  Have you ever been treated for substance abuse? No  Mental Health History How have you been feeling in the past year? Overall has been feeling pretty god over the past year.  Has felt some feelings of depression and anxiety over his health but mainly before he knew what was going on- now that he has a direction and knows possible options for treatment he is feeling much better.   PHQ2 Depression Scale: 2  Have you ever had any problems with depression, anxiety or other mental health concerns? no Have you or are you taking medications for anxiety/depression or any mental health concerns?  No  Do you have a history of a traumatic event? no Have you had any past or current thoughts of suicide? no Are there any other current stressors in your life?  Not currently Do you see a counselor, psychiatrist or therapist?  no If you are currently experiencing problems are you interested in talking with a professional? No What are your coping strategies under stressful situations? Turns to prayer  Would you be interested in attending the LVAD support group? yes   Medical & Follow-up Do you take your medications as prescribed by the doctors? yes Do you feel as if you have a good understanding of your medications and what they are for? yes If you experience medical concerns or barriers to care in between appointments how do you manage this?  Will try to manage concerns at home based on MD recommendations but when he feels bad enough he will seek out help- came to ED in this instance due to inability  to breath.  Reports he is not stubborn about seeking help when he feels bad No Show Rate Percentage: 17%    Caregiving Needs Who is the primary caregiver? Gerlene Daring Health status:  good Do you drive?  yes Do you work?  Yes- full time and varying hours- changes shifts based on the week Physical Limitations:  none Do you have other care giving responsibilities?  no Contact number: [phone]  Who is the secondary caregiver? Shameka Health status:  good Do you drive?  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  3 small children   Plan for VAD Implementation Do you know and understand what happens during the VAD surgery? Patient Verbalizes Understanding  of surgery and able to describe details What do you know about the risks and  side effect associated with VAD surgery? Patient Verbalizes Understanding  of risks (infection, stroke and death) Explain what will happen right after surgery: Patient Verbalizes Understanding  of OR to ICU and will be intubated  What is your plan for transportation for the first 8 weeks post-surgery? (Patients are not recommended to drive post-surgery for 8 weeks)  Driver: wife, Gerlene Do you have airbags in your vehicle?  There is a risk of discharging the device if the airbag were to deploy. yes What do you know about your diet post-surgery? Patient Verbalizes Understanding  of Heart healthy How do you plan to complete ADL's post-surgery?  Hopeful to be fairly independent but is comfortable asking for help when needed.  Please explain what you hope will be improved about your life as a result of receiving the LVAD? Hoping to be able to breath better and be able to maintain a physical lifestyle. Please tell me your biggest concern or fear about living with the LVAD?  Worried about how it might affect him doing the things he loves like hunting and riding his boat and ATV but has less  concerns the more he learns about the device. Please explain your understanding of how your body will change?  Understands about the driveline and the bulky equipment. Are you worried about these changes? Somewhat- anxious to meet another LVAD patient so he can get a sense of what it looks and feels like for someone who has it.  Would also want to try and make it as unnoticable as possible. Do you see any barriers to your surgery or follow-up? no  Understanding of LVAD Patient states understanding of the following: Surgical procedures and risks, Electrical need for LVAD (3 prong outlets), Safety precautions with LVAD (water , etc.), LVAD daily self-care (dressing changes, computer check, extra supplies), Outpatient follow up (LVAD clinic appts, monitoring blood thinners), and Need for Emergency Planning  Discussed and Reviewed with Patient and Caregiver  Patient's current level of motivation to prepare for LVAD:  Patient's present Level of Consent for LVAD:     Clinical Interventions Needed:     CSW will monitor signs and symptoms of depression and assist with adjustment to life with an LVAD.  CSW will refer patient for Advanced Directives, if not completed prior to surgery if still wishing to complete.  CSW encouraged attendance with the LVAD Support Group to assist further with adjustment and post implant peer support.   Clinical Impressions/Recommendations:    CSW met with Mr. Probert during hospital stay to complete LVAD Psychosocial Evaluation.  Patient was well spoken and appeared to be alert and oriented.  He appeared engaged in the conversation and seemed to understand the seriousness of his condition but was handling the stress of this process well.  Throughout the interview he answered questions thoroughly and thoughtfully.  Mr. Sagar is a 59 you male who is currently married and has 4 children ranging from 14yo-36yo- none of these children live with him at this time.  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  He presents as a Surveyor, minerals individual who has a good understanding of his condition and the interventions being considered at this time.  He reports strong support from his wife and children who all live locally.   He does have some financial concerns as he depends on his income to upkeep household expenses and would not have a large amount  of savings to hold them over if he was out of work for too long.  He would plan to handle this by applying for STD as soon as possible so he would have a source of income while he is unable to work.  Mr. Aguila reports history significant for tobacco abuse but states he quit last year and he has been very happy with his decision.  States his wife still smokes outside the house but this does not temp him to use as he is very proud of himself.  He reports no history of alcohol or illegal substance use.  Though Mr. Scheiber admits he has been somewhat down and anxious about his health he attributes most of this to not knowing what was happening prior to his admission and now that he has a better idea of what is going on with his health he no longer feels down or anxious.  He does not feel as if he needs mental health interventions at this time.  Mr. Deller plans to have caregiving provided by his wife.  His wife would plan to take FMLA to be present for him 24 hours a day for the first 2 weeks and would be able to drive him to appointments when needed.  Wife was not present during interview so CSW will plan to arrange caregiver interview at a different time if he ends up proceeding with LVAD.  Mr. Casillas remains optimistic for a transplant but understands barriers to moving forward with this and would be motivated and agreeable to move forward with LVAD if that is the next step recommended by the medical team.  CSW will continue to follow patient and await word from the medical team  regarding plan for transplant vs LVAD.  Andriette HILARIO Leech, LCSW Clinical Social Worker Advanced Heart Failure Clinic Desk#: [phone] Cell#: [phone]

## 2024-07-16 NOTE — Discharge Summary (Incomplete)
 Advanced Heart Failure Team  Discharge Summary   Patient ID: James [name] MRN: [id number], DOB/AGE: Dec 10, 1960 63 y.o. Admit date: 07/09/2024 D/C date:     07/17/2024   Primary Discharge Diagnoses:  Cardiogenic Shock Acute on chronic systolic heart failure PVCs/NSVT  Secondary Discharge Diagnoses:  AKI on CKD 3b Atrial Fibrillation Prostate Cancer Meningioma Tranaminities Hypokalemia  Hospital Course:   James [name] is a 63 y.o. male with history nonischemic cardiomyopathy s/p ICD, atrial fibrillation, VT, HTN, T2DM, aortic root aneurysm, prostate cancer s/p EBRT and ADT, meningioma.   Admitted 07/09/24 with cardiogenic shock. Previous admission 6 months ago for similar decompensation and was worked of for transplant/LVAD. Presented with lactic acidosis, AKI, and liver shock. Echo this admit showed EF of 10-15% with mod reduced RV. He was started on Milrinone and aggressively diuresed with improvement of AKI and liver function. Failed attempt to wean from milrinone. Will plan to go home with home milrinone, tunneled PICC line placed. Course has also been complicated by high PVC and NSVT burden, which has been decreased with mexiletine and amiodarone.  Given that this is his second [facility] admission in the last year and now milrinone dependent, he has re-complete advanced therapies workup. He will go home with home milrinone with follow up at [facility] for transplant evaluation. If he is not a candidate for transplant, plan is to return to [facility] for LVAD implant.   Seen today by Dr. Cherrie and deemed appropriate for discharge home with home milrinone and close follow up with [facility].  Discharge Weight: 240 lb Discharge Vitals: Blood pressure (!) 136/96, pulse 83, temperature 97.8 F (36.6 C), temperature source Oral, resp. rate 19, height 6' 6 (1.981 m), weight 109.2 kg, SpO2 97%.  Labs: Lab Results  Component Value Date   WBC 9.5 07/17/2024   HGB 16.5 07/17/2024    HCT 47.6 07/17/2024   MCV 93.0 07/17/2024   PLT 154 07/17/2024    Recent Labs  Lab 07/15/24 0415 07/16/24 0231 07/17/24 0404  NA 134*   < > 136  K 3.7   < > 3.8  CL 92*   < > 94*  CO2 33*   < > 31  BUN 29*   < > 34*  CREATININE 1.38*   < > 1.45*  CALCIUM  8.6*   < > 9.1  PROT 6.8  --   --   BILITOT 3.8*  --   --   ALKPHOS 124  --   --   ALT 673*  --   --   AST 113*  --   --   GLUCOSE 135*   < > 123*   < > = values in this interval not displayed.   Lab Results  Component Value Date   CHOL 134 07/10/2024   HDL 33 (L) 07/10/2024   LDLCALC 82 07/10/2024   TRIG 93 07/10/2024   BNP (last 3 results) Recent Labs    06/03/24 0933 06/10/24 1213 07/09/24 1649  BNP 795.1* 1,071.5* 3,316.0*    ProBNP (last 3 results) No results for input(s): PROBNP in the last 8760 hours.   Diagnostic Studies/Procedures   VAS US  DOPPLER PRE VAD Result Date: 07/16/2024 PERIOPERATIVE VASCULAR EVALUATION Patient Name:  James [name]  Date of Exam:   07/15/2024 Medical Rec #: [id number]          Accession #:    [id number] Date of Birth: [birth year]          Patient Gender:  M Patient Age:   81 years Exam Location:  [facility] Procedure:      VAS US  DOPPLER PRE VAD Referring Phys: Kailyn Dubie --------------------------------------------------------------------------------  Indications:      Pre-op LVAD. Risk Factors:     Hypertension, hyperlipidemia, Diabetes, past history of                   smoking. Other Factors:    CHF, CKD, Afib, ICD. Limitations:      Venous interference, patient movement (feet), central line                   placement Comparison Study: Previous LVAD exam on 12/13/2022 Performing Technologist: Leigh Rom RVT/RDMS  Examination Guidelines: A complete evaluation includes B-mode imaging, spectral Doppler, color Doppler, and power Doppler as needed of all accessible portions of each vessel. Bilateral testing is considered an integral part of a complete examination.  Limited examinations for reoccurring indications may be performed as noted.  Right Carotid Findings: +----------+--------+--------+--------+--------+------------------+           PSV cm/sEDV cm/sStenosisDescribeComments           +----------+--------+--------+--------+--------+------------------+ CCA Prox  58      7                                          +----------+--------+--------+--------+--------+------------------+ CCA Distal38      10                      intimal thickening +----------+--------+--------+--------+--------+------------------+ ICA Prox  31      9                                          +----------+--------+--------+--------+--------+------------------+ ICA Distal68      18                                         +----------+--------+--------+--------+--------+------------------+ ECA       70      9                                          +----------+--------+--------+--------+--------+------------------+ +----------+--------+-------+----------------+------------+           PSV cm/sEDV cmsDescribe        Arm Pressure +----------+--------+-------+----------------+------------+ Subclavian84             Multiphasic, [id number]          +----------+--------+-------+----------------+------------+ +---------+--------+--+--------+-+---------+ VertebralPSV cm/s49EDV cm/s8Antegrade +---------+--------+--+--------+-+---------+ Left Carotid Findings: +----------+--------+--------+--------+--------+-------------------------------+           PSV cm/sEDV cm/sStenosisDescribeComments                        +----------+--------+--------+--------+--------+-------------------------------+ CCA Prox                                  Proximal CCA is not visualized. +----------+--------+--------+--------+--------+-------------------------------+ CCA Distal  Not visualized                   +----------+--------+--------+--------+--------+-------------------------------+ ICA Prox  40      10                                                      +----------+--------+--------+--------+--------+-------------------------------+ ICA Distal43      14                                                      +----------+--------+--------+--------+--------+-------------------------------+ ECA       73      10                                                      +----------+--------+--------+--------+--------+-------------------------------+ +----------+--------+--------+----------------+------------+ SubclavianPSV cm/sEDV cm/sDescribe        Arm Pressure +----------+--------+--------+----------------+------------+                           Multiphasic, [id number]          +----------+--------+--------+----------------+------------+ +---------+--------+--+--------+--+---------+ VertebralPSV cm/s39EDV cm/s12Antegrade +---------+--------+--+--------+--+---------+ Left CCA not visualized and limited visualization of left subclavian due to IV placement.  ABI Findings: +---------+------------------+-----+-----------+--------+ Right    Rt Pressure (mmHg)IndexWaveform   Comment  +---------+------------------+-----+-----------+--------+ Brachial 142                    biphasic            +---------+------------------+-----+-----------+--------+ PTA      135               0.95 multiphasic         +---------+------------------+-----+-----------+--------+ DP       117               0.82 monophasic          +---------+------------------+-----+-----------+--------+ [facility]                     Abnormal            +---------+------------------+-----+-----------+--------+ +---------+------------------+-----+----------+-------+ Left     Lt Pressure (mmHg)IndexWaveform  Comment +---------+------------------+-----+----------+-------+ Brachial 132                     triphasic         +---------+------------------+-----+----------+-------+ PTA      107               0.75 monophasic        +---------+------------------+-----+----------+-------+ DP       92                0.65 monophasic        +---------+------------------+-----+----------+-------+ Burnetta Pelton                     Abnormal          +---------+------------------+-----+----------+-------+ +-------+---------------+----------------+ ABI/TBIToday's ABI/TBIPrevious ABI/TBI +-------+---------------+----------------+ Right  0.95 / 0.44    0.88             +-------+---------------+----------------+  Left   0.75 / 0.52    0.73             +-------+---------------+----------------+ Doppler waveforms difficult to obtain due patient foot movement and venous interference.  Summary: Right Carotid: The extracranial vessels were near-normal with only minimal wall                thickening or plaque. Left Carotid: The extracranial vessels were near-normal with only minimal wall               thickening or plaque. Left CCA not visualized due to central line               placement. Vertebrals:  Bilateral vertebral arteries demonstrate antegrade flow. Subclavians: Normal flow hemodynamics were seen in bilateral subclavian              arteries.  *See table(s) above for measurements and observations. Right ABI: Resting right ankle-brachial index is within normal range. The right toe-brachial index is abnormal. Left ABI: Resting left ankle-brachial index indicates moderate left lower extremity arterial disease. The left toe-brachial index is abnormal.  Electronically signed by Debby Robertson on 07/16/2024 at 5:37:13 PM.    Final    VAS US  LOWER EXTREMITY VENOUS (DVT) Result Date: 07/15/2024  Lower Venous DVT Study Patient Name:  James [name]  Date of Exam:   07/15/2024 Medical Rec #: [id number]          Accession #:    [id number] Date of Birth: 08-01-1961          Patient Gender: M Patient Age:    26 years Exam Location:  [facility] Procedure:      VAS US  LOWER EXTREMITY VENOUS (DVT) Referring Phys: James [name] --------------------------------------------------------------------------------  Indications: Pre-op (LVAD).  Comparison Study: Previous exam on 12/13/2022 was negative for DVT Performing Technologist: Ezzie Potters RVT, RDMS  Examination Guidelines: A complete evaluation includes B-mode imaging, spectral Doppler, color Doppler, and power Doppler as needed of all accessible portions of each vessel. Bilateral testing is considered an integral part of a complete examination. Limited examinations for reoccurring indications may be performed as noted. The reflux portion of the exam is performed with the patient in reverse Trendelenburg.  +---------+---------------+---------+-----------+----------+--------------+ RIGHT    CompressibilityPhasicitySpontaneityPropertiesThrombus Aging +---------+---------------+---------+-----------+----------+--------------+ CFV      Full           Yes      Yes                                 +---------+---------------+---------+-----------+----------+--------------+ SFJ      Full                                                        +---------+---------------+---------+-----------+----------+--------------+ FV Prox  Full           Yes      Yes                                 +---------+---------------+---------+-----------+----------+--------------+ FV Mid   Full           Yes      Yes                                 +---------+---------------+---------+-----------+----------+--------------+  FV DistalFull           Yes      Yes                                 +---------+---------------+---------+-----------+----------+--------------+ PFV      Full                                                        +---------+---------------+---------+-----------+----------+--------------+ POP      Full           Yes      Yes                                  +---------+---------------+---------+-----------+----------+--------------+ PTV      Full                                                        +---------+---------------+---------+-----------+----------+--------------+ PERO     Full                                                        +---------+---------------+---------+-----------+----------+--------------+   +---------+---------------+---------+-----------+----------+--------------+ LEFT     CompressibilityPhasicitySpontaneityPropertiesThrombus Aging +---------+---------------+---------+-----------+----------+--------------+ CFV      Full           Yes      Yes                                 +---------+---------------+---------+-----------+----------+--------------+ SFJ      Full                                                        +---------+---------------+---------+-----------+----------+--------------+ FV Prox  Full           Yes      Yes                                 +---------+---------------+---------+-----------+----------+--------------+ FV Mid   Full           Yes      Yes                                 +---------+---------------+---------+-----------+----------+--------------+ FV DistalFull           Yes      Yes                                 +---------+---------------+---------+-----------+----------+--------------+ PFV      Full                                                        +---------+---------------+---------+-----------+----------+--------------+  POP      Full           Yes      Yes                                 +---------+---------------+---------+-----------+----------+--------------+ PTV      Full                                                        +---------+---------------+---------+-----------+----------+--------------+ PERO     Full                                                         +---------+---------------+---------+-----------+----------+--------------+     Summary: BILATERAL: - No evidence of deep vein thrombosis seen in the lower extremities, bilaterally. -No evidence of popliteal cyst, bilaterally.   *See table(s) above for measurements and observations. Electronically signed by Lonni Gaskins MD on 07/15/2024 at 6:26:57 PM.    Final     Discharge Medications   Allergies as of 07/17/2024   No Known Allergies      Medication List     STOP taking these medications    aspirin  EC 81 MG tablet   carvedilol  3.125 MG tablet Commonly known as: COREG    Entresto  97-103 MG Generic drug: sacubitril -valsartan        TAKE these medications    acetaminophen  500 MG tablet Commonly known as: TYLENOL  Take 1,000 mg by mouth every 6 (six) hours as needed for moderate pain.   albuterol  108 (90 Base) MCG/ACT inhaler Commonly known as: VENTOLIN  HFA INHALE 2 PUFFS BY MOUTH FOUR TIMES DAILY AS NEEDED   amiodarone 200 MG tablet Commonly known as: PACERONE Take 1 tablet (200 mg total) by mouth 2 (two) times daily.   apixaban  5 MG Tabs tablet Commonly known as: Eliquis  Take 1 tablet (5 mg total) by mouth 2 (two) times daily.   atorvastatin  40 MG tablet Commonly known as: LIPITOR Take 1 tablet (40 mg total) by mouth daily.   digoxin 0.125 MG tablet Commonly known as: LANOXIN Take 1 tablet (0.125 mg total) by mouth daily. Start taking on: July 18, 2024   Farxiga  10 MG Tabs tablet Generic drug: dapagliflozin  propanediol TAKE 1 TABLET(10 MG) BY MOUTH DAILY BEFORE BREAKFAST   hydrALAZINE  50 MG tablet Commonly known as: APRESOLINE  Take 1 tablet (50 mg total) by mouth every 8 (eight) hours.   metFORMIN 500 MG tablet Commonly known as: GLUCOPHAGE TAKE 1 TABLET BY MOUTH TWICE DAILY   mexiletine 200 MG capsule Commonly known as: MEXITIL Take 1 capsule (200 mg total) by mouth every 8 (eight) hours.   milrinone 20 MG/100 ML Soln infusion Commonly known as:  PRIMACOR Inject 0.0294 mg/min into the vein continuous.   potassium chloride  SA 20 MEQ tablet Commonly known as: KLOR-CON  M Take 1 tablet (20 mEq total) by mouth daily. Start taking on: July 18, 2024   spironolactone  25 MG tablet Commonly known as: ALDACTONE  Take 1 tablet (25 mg total) by mouth at bedtime.   torsemide  20 MG tablet Commonly known as: DEMADEX  Take 1 tablet (20 mg  total) by mouth daily. Start taking on: July 18, 2024 What changed: how much to take               Durable Medical Equipment  (From admission, onward)           Start     Ordered   07/16/24 1402  For home use only DME Vest life vest  Once       Comments: ZOLL Life Vest  Life Vest Setting  VT 150 bpm VF 200 BPM 150Jx5 Length of need: 3 months  Start Date: 07/17/24  Question Answer Comment  Indication: Other condition with high risk of VT/VF   Specify: Home milrinone and NSVT   Life Vest Setting: VT Heart Rate Threshold - Defaut 150 BPM   Length of need: 3 months   Start date: 07/17/2024      07/16/24 1412            Disposition   The patient will be discharged in stable condition to home.   Follow-up Information     [facility] Follow up on 07/23/2024.   Specialty: Cardiology Why: at 9:00am [facility], Entrance C Contact information: [address] [place]  [address] [phone]        [facility] Follow up.   Why: Home Infusion agency will provide medications Contact information: [phone]        Bevely Doffing, FNP Follow up.   Specialty: Family Medicine Why: hospital follow up appt scheduled for 07/30/2024 at 11 am. please reschedule if you are unable to keep appt. Contact information: [address] [place] [address] [phone]                   Duration of Discharge Encounter: 30 min   Caffie Shed, PA-C 07/17/2024, 3:27  PM   Patient seen and examined with the above-signed Advanced Practice Provider and/or Housestaff. I personally reviewed laboratory data, imaging studies and relevant notes. I independently examined the patient and formulated the important aspects of the plan. I have edited the note to reflect any of my changes or salient points. I have personally discussed the plan with the patient and/or family.  Remains on milrinone 0.25 co-ox 63% CVP low  Tunneled PICC placed today by IR   On amio and mexilitene for PVCs/NSVT  General:  Sitting up . No resp difficulty HEENT: normal Neck: supple. no JVD. Carotids 2+ bilat; no bruits. No lymphadenopathy or thryomegaly appreciated. Cor: PMI nondisplaced. Regular rate & rhythm. No rubs, gallops or murmurs. Lungs: clear Abdomen: soft, nontender, nondistended. No hepatosplenomegaly. No bruits or masses. Good bowel sounds. Extremities: no cyanosis, clubbing, rash, edema Neuro: alert & orientedx3, cranial nerves grossly intact. moves all 4 extremities w/o difficulty. Affect pleasant  Will d/c home today on home milrinone. Will arrange transplant w/u at [facility] in near future. If not candidate will consider VAD.   Watch for LV recovery with PVC suppression.  MD time 40 mins  James Fuel, MD  9:34 PM

## 2024-07-17 ENCOUNTER — Inpatient Hospital Stay (HOSPITAL_COMMUNITY)

## 2024-07-17 ENCOUNTER — Other Ambulatory Visit (HOSPITAL_COMMUNITY): Payer: Self-pay

## 2024-07-17 DIAGNOSIS — I5020 Unspecified systolic (congestive) heart failure: Secondary | ICD-10-CM | POA: Diagnosis not present

## 2024-07-17 DIAGNOSIS — I5041 Acute combined systolic (congestive) and diastolic (congestive) heart failure: Secondary | ICD-10-CM | POA: Diagnosis not present

## 2024-07-17 HISTORY — PX: IR TUNNELED CENTRAL VENOUS CATH PLC W IMG: IMG1939

## 2024-07-17 LAB — BASIC METABOLIC PANEL WITH GFR
Anion gap: 11 (ref 5–15)
BUN: 34 mg/dL — ABNORMAL HIGH (ref 8–23)
CO2: 31 mmol/L (ref 22–32)
Calcium: 9.1 mg/dL (ref 8.9–10.3)
Chloride: 94 mmol/L — ABNORMAL LOW (ref 98–111)
Creatinine, Ser: 1.45 mg/dL — ABNORMAL HIGH (ref 0.61–1.24)
GFR, Estimated: 54 mL/min — ABNORMAL LOW (ref >60)
Glucose, Bld: 123 mg/dL — ABNORMAL HIGH (ref 70–99)
Potassium: 3.8 mmol/L (ref 3.5–5.1)
Sodium: 136 mmol/L (ref 135–145)

## 2024-07-17 LAB — CBC
HCT: 47.6 % (ref 39.0–52.0)
Hemoglobin: 16.5 g/dL (ref 13.0–17.0)
MCH: 32.2 pg (ref 26.0–34.0)
MCHC: 34.7 g/dL (ref 30.0–36.0)
MCV: 93 fL (ref 80.0–100.0)
Platelets: 154 K/uL (ref 150–400)
RBC: 5.12 MIL/uL (ref 4.22–5.81)
RDW: 14.6 % (ref 11.5–15.5)
WBC: 9.5 K/uL (ref 4.0–10.5)
nRBC: 0 % (ref 0.0–0.2)

## 2024-07-17 LAB — LUPUS ANTICOAGULANT PANEL
DRVVT: 62.7 s — ABNORMAL HIGH (ref 0.0–47.0)
PTT Lupus Anticoagulant: 40.1 s (ref 0.0–43.5)

## 2024-07-17 LAB — GLUCOSE, CAPILLARY
Glucose-Capillary: 161 mg/dL — ABNORMAL HIGH (ref 70–99)
Glucose-Capillary: 194 mg/dL — ABNORMAL HIGH (ref 70–99)

## 2024-07-17 LAB — COOXEMETRY PANEL
Carboxyhemoglobin: 2.1 % — ABNORMAL HIGH (ref 0.5–1.5)
Methemoglobin: <0.7 % (ref 0.0–1.5)
O2 Saturation: 62.7 %
Total hemoglobin: 17.3 g/dL — ABNORMAL HIGH (ref 12.0–16.0)

## 2024-07-17 LAB — DIGOXIN LEVEL: Digoxin Level: <0.6 ng/mL — ABNORMAL LOW (ref 0.8–2.0)

## 2024-07-17 LAB — DRVVT CONFIRM: dRVVT Confirm: 0.8 ratio (ref 0.8–1.2)

## 2024-07-17 LAB — DRVVT MIX: dRVVT Mix: 48.6 s — ABNORMAL HIGH (ref 0.0–40.4)

## 2024-07-17 LAB — MAGNESIUM: Magnesium: 2.5 mg/dL — ABNORMAL HIGH (ref 1.7–2.4)

## 2024-07-17 MED ORDER — POTASSIUM CHLORIDE CRYS ER 20 MEQ PO TBCR
20.0000 meq | EXTENDED_RELEASE_TABLET | Freq: Every day | ORAL | 3 refills | Status: DC
  Filled 2024-07-17: qty 30, 30d supply, fill #0

## 2024-07-17 MED ORDER — HYDRALAZINE HCL 50 MG PO TABS
50.0000 mg | ORAL_TABLET | Freq: Three times a day (TID) | ORAL | 5 refills | Status: DC
  Filled 2024-07-17: qty 90, 30d supply, fill #0

## 2024-07-17 MED ORDER — TORSEMIDE 20 MG PO TABS
20.0000 mg | ORAL_TABLET | Freq: Every day | ORAL | 3 refills | Status: DC
  Filled 2024-07-17: qty 30, 30d supply, fill #0

## 2024-07-17 MED ORDER — HEPARIN SOD (PORK) LOCK FLUSH 100 UNIT/ML IV SOLN
INTRAVENOUS | Status: AC
  Filled 2024-07-17: qty 5

## 2024-07-17 MED ORDER — LIDOCAINE-EPINEPHRINE 1 %-1:100000 IJ SOLN
INTRAMUSCULAR | Status: AC
  Filled 2024-07-17: qty 1

## 2024-07-17 MED ORDER — LIDOCAINE HCL 1 % IJ SOLN
10.0000 mL | Freq: Once | INTRAMUSCULAR | Status: AC
  Administered 2024-07-17: 10 mL

## 2024-07-17 MED ORDER — DIGOXIN 125 MCG PO TABS
0.1250 mg | ORAL_TABLET | Freq: Every day | ORAL | 5 refills | Status: DC
  Filled 2024-07-17: qty 30, 30d supply, fill #0

## 2024-07-17 MED ORDER — AMIODARONE HCL 200 MG PO TABS
200.0000 mg | ORAL_TABLET | Freq: Two times a day (BID) | ORAL | 5 refills | Status: DC
  Filled 2024-07-17: qty 60, 30d supply, fill #0

## 2024-07-17 MED ORDER — MEXILETINE HCL 200 MG PO CAPS
200.0000 mg | ORAL_CAPSULE | Freq: Three times a day (TID) | ORAL | 5 refills | Status: DC
  Filled 2024-07-17 – 2024-07-28 (×2): qty 90, 30d supply, fill #0

## 2024-07-17 MED ORDER — POTASSIUM CHLORIDE CRYS ER 20 MEQ PO TBCR
40.0000 meq | EXTENDED_RELEASE_TABLET | ORAL | Status: AC
  Administered 2024-07-17: 40 meq via ORAL
  Filled 2024-07-17: qty 2

## 2024-07-17 MED ORDER — MILRINONE LACTATE IN DEXTROSE 20-5 MG/100ML-% IV SOLN: 0.2500 ug/kg/min | INTRAVENOUS | Status: DC

## 2024-07-17 MED ORDER — LIDOCAINE-EPINEPHRINE 1 %-1:100000 IJ SOLN
10.0000 mL | Freq: Once | INTRAMUSCULAR | Status: AC
  Administered 2024-07-17: 10 mL via INTRADERMAL

## 2024-07-17 NOTE — TOC Transition Note (Signed)
 Transition of Care [facility]) - Discharge Note   Patient Details  Name: James [name] MRN: [id number] Date of Birth: July 05, 1961  [facility] CM/SW Contact:  Justina Delcia Czar, RN Phone Number: [phone] 07/17/2024, 1:24 PM   Clinical Narrative:     Spoke to pt at bedside. Provided pt with contact to send his FMLA paperwork. Pt will complete through [facility] portal. Contacted [facility] rep, Pam to make aware of dc home today on Home Milrinone. Pam will schedule [facility] for follow up at home.   Scheduled PCP appt for hospital follow up on 07/30/2024 at 11 am.  Wife will provide transportation home.   Final next level of care: Home w Home Health Services Barriers to Discharge: No Barriers Identified   Patient Goals and CMS Choice Patient states their goals for this hospitalization and ongoing recovery are:: wants to recover CMS Medicare.gov Compare Post Acute Care list provided to:: Patient Choice offered to / list presented to : Patient      Discharge Placement                       Discharge Plan and Services Additional resources added to the After Visit Summary for   In-house Referral: Clinical Social Work Discharge Planning Services: CM Consult Post Acute Care Choice: Home Health                    HH Arranged: RN [facility] Agency: [facility] Date HH Agency Contacted: 07/17/24 Time HH Agency Contacted: 1318 Representative spoke with at [facility] Agency: Holley Herring RN  Social Drivers of Health (SDOH) Interventions SDOH Screenings   Food Insecurity: No Food Insecurity (07/09/2024)  Housing: Low Risk  (07/09/2024)  Transportation Needs: No Transportation Needs (07/09/2024)  Utilities: Not At Risk (07/09/2024)  Alcohol Screen: Low Risk  (11/18/2023)  Depression (PHQ2-9): Low Risk  (03/24/2024)  Financial Resource Strain: Low Risk  (07/21/2021)  Tobacco Use: Medium Risk (07/09/2024)     Readmission Risk Interventions    07/10/2024   11:05 AM 10/10/2023    10:37 AM  Readmission Risk Prevention Plan  Transportation Screening Complete Complete  Home Care Screening Complete Complete  Medication Review (RN CM) Complete Complete

## 2024-07-17 NOTE — Progress Notes (Addendum)
@   0144 pt had a 14-beat-run of V-tach, resting comfortably no complaints of pain or shortness of breath. Telemetry strip saved. Attempted to notify [place], MD.  Pt had another 20-beat-run of V-tach @ 0402, primary RN present, no complaints of CP or shortness of breath, telemetry strip saved, attempted to notify [place], MD.  Lonell LITTIE Lyme, RN

## 2024-07-17 NOTE — Progress Notes (Addendum)
 Patient ID: James [name], male   DOB: [birth year], 63 y.o.   MRN: [id number]     Advanced Heart Failure Rounding Note  Cardiologist: Maude Emmer, MD  Chief Complaint: CHF Subjective:    Failed milrinone wean 10/1.   Back on milrinone at 0.25 mcg/kg/min. Co-ox 63% today.    CVP 2  SCr 1.67>>1.67   Had 2 runs of VT this morning, 14 and 20 beats. Asymptomatic.  K 3.8 Mg 2.5   Laying in bed. Feels well. Denies resting dyspnea. Family present at bedside.    Objective:    Weight Range: 109.2 kg Body mass index is 27.83 kg/m.   Vital Signs:   Temp:  [97.8 F (36.6 C)-98.3 F (36.8 C)] 97.8 F (36.6 C) (10/03 1039) Pulse Rate:  [80-84] 83 (10/03 0836) Resp:  [17-19] 19 (10/03 1039) BP: (117-134)/(72-93) 118/77 (10/03 1039) SpO2:  [95 %-99 %] 97 % (10/03 1039) Weight:  [109.2 kg] 109.2 kg (10/03 0612) Last BM Date : 07/11/24  Weight change: Filed Weights   07/15/24 0701 07/16/24 0600 07/17/24 0612  Weight: 108.2 kg 109.3 kg 109.2 kg   Intake/Output:  Intake/Output Summary (Last 24 hours) at 07/17/2024 1052 Last data filed at 07/17/2024 0837 Gross per 24 hour  Intake 622.37 ml  Output 2350 ml  Net -1727.63 ml    Physical Exam    CVP 2  General: well appearing. NAD, LIJ CVC   Cardiac: RRR, JVD 6 cm  Extremities: no LEE, warm and dry  Neuro: Alert and oriented x3. Affect pleasant. Moves all extremities without difficulty.  Telemetry   NSR 80s w/ PVCs and 2 runs of VT (14 and 20 beats) (personally reviewed)  Labs    CBC Recent Labs    07/16/24 0231 07/17/24 0404  WBC 9.1 9.5  HGB 16.1 16.5  HCT 47.3 47.6  MCV 94.4 93.0  PLT 157 154   Basic Metabolic Panel Recent Labs    89/97/74 0231 07/17/24 0404  NA 133* 136  K 3.8 3.8  CL 89* 94*  CO2 32 31  GLUCOSE 127* 123*  BUN 37* 34*  CREATININE 1.67* 1.45*  CALCIUM  8.9 9.1  MG 2.4 2.5*  PHOS 3.9  --    Liver Function Tests Recent Labs    07/15/24 0415 07/16/24 0231  AST 113*  --   ALT  673*  --   ALKPHOS 124  --   BILITOT 3.8*  --   PROT 6.8  --   ALBUMIN 3.4* 3.5   BNP (last 3 results) Recent Labs    06/03/24 0933 06/10/24 1213 07/09/24 1649  BNP 795.1* 1,071.5* 3,316.0*   Medications:    Scheduled Medications:  amiodarone  200 mg Oral BID   apixaban   5 mg Oral BID   Chlorhexidine  Gluconate Cloth  6 each Topical Daily   dapagliflozin  propanediol  10 mg Oral Daily   digoxin  0.125 mg Oral Daily   feeding supplement  237 mL Oral TID BM   hydrALAZINE   50 mg Oral Q8H   insulin  aspart  0-5 Units Subcutaneous QHS   insulin  aspart  0-9 Units Subcutaneous TID WC   mexiletine  200 mg Oral Q8H   multivitamin with minerals  1 tablet Oral Daily   potassium chloride   40 mEq Oral Q4H   sodium chloride  flush  3 mL Intravenous Q12H   spironolactone   25 mg Oral Daily    Infusions:  milrinone 0.25 mcg/kg/min (07/17/24 0423)    PRN Medications: acetaminophen ,  albuterol , alum & mag hydroxide-simeth, melatonin, ondansetron  (ZOFRAN ) IV, mouth rinse, polyethylene glycol, sodium chloride  flush  Assessment/Plan   1. Acute on chronic systolic CHF/Cardiogenic shock: Shock resolved. Long history of nonischemic cardiomyopathy. Has Biotronik ICD.  History of low output on RHC and poor CPX but with minimal symptoms in the past.  Considering advanced therapies, has preferred transplant.  No smoking since 09/15/23.  Has controlled prostate cancer.  He was admitted to [facility] with volume overload and signs of low output HF AKI on CKD stage 3 and elevated lactate. Started on Milrinone and Lasix  gtt. Diuresed. Failed milrinone wean. Remains inotrope dependent. Back on Milrinone 0.25 mcg/kg/min. Co-ox 63%. CVP 2.  - Plan to d/c w/ home milrinone 0.25 mcg/kg/min. IR consulted for placement of tunneled picc  - hold torsemide  w/ low CVP. Resume home dose tomorrow  - continue digoxin 0.125 mcg/kg/min - continue dapagliflozin  10 mg daily - continue spiro 25 mg daily - continue hydral 50 mg  tid - Beta blocker on hold with low output/CGS.  - Will need ongoing workup for advanced therapies. Dr. Wave Calzada has discussed with [facility]. Unable to wean milrinone, will plan for home milrinone with close follow up with [facility] transplant. If he is not transplant candidate, we will continue with plan for VAD.    2. AKI on CKD stage 3: Creatinine improving with diuresis  Suspect cardiorenal. Cr 2.3 >2.17 > 1.78 > 1.44 > 1.63>> 1.45 today   3. PVCs/NSVT: Frequent on milrinone.  - continue mexilitine 200 mg tid - continue amio 200 mg bid - has ICD - give K supp (3.8), Mg ok at 2.5   4. Atrial fibrillation: Paroxysmal. NSR currently.  - Continue Eliquis . Does not need aspirin .   5. Prostate cancer: completed EBRT 4/25. Completed ST ADT.   6. Meningioma. Noted on CT 5/25, seen neurosx. CT scan this admission stable size.   7. Elevated LFTs: In setting of cardiogenic shock. Slowly improving.  - Follow LFTs.   8. Hypokalemia: resolved  Anticipate d/c home today w/ home milrinone after IR places tunneled picc   Length of Stay: [address] PA-C  07/17/2024, 10:52 AM  Advanced Heart Failure Team Pager [phone] (M-F; 7a - 5p)  Please contact [facility] for night-coverage after hours (5p -7a ) and weekends on [email or web address]  Patient seen and examined with the above-signed Advanced Practice Provider and/or Housestaff. I personally reviewed laboratory data, imaging studies and relevant notes. I independently examined the patient and formulated the important aspects of the plan. I have edited the note to reflect any of my changes or salient points. I have personally discussed the plan with the patient and/or family.  Remains on milrinone 0.25 co-ox 63% CVP low  Tunneled PICC placed today by IR   On amio and mexilitene for PVCs/NSVT  General:  Sitting up . No resp difficulty HEENT: normal Neck: supple. no JVD. Carotids 2+ bilat; no bruits. No lymphadenopathy or thryomegaly  appreciated. Cor: PMI nondisplaced. Regular rate & rhythm. No rubs, gallops or murmurs. Lungs: clear Abdomen: soft, nontender, nondistended. No hepatosplenomegaly. No bruits or masses. Good bowel sounds. Extremities: no cyanosis, clubbing, rash, edema Neuro: alert & orientedx3, cranial nerves grossly intact. moves all 4 extremities w/o difficulty. Affect pleasant  Will d/c home today on home milrinone. Will arrange transplant w/u at [facility] in near future. If not candidate will consider VAD.   Watch for LV recovery with PVC suppression.  MD time 40 mins  Toribio Fuel, MD  9:34 PM

## 2024-07-17 NOTE — Procedures (Signed)
 Interventional Radiology Procedure:   Indications: Acute on chronic systolic CHF.   Needs chronic central line for milrinone.  Procedure: Placement of tunneled central venous catheter  Findings: Single lumen Powerline, tip at SVC/RA junction.  Length 29  cm.   Complications: None     EBL: Minimal  Plan: Central line is ready to use.   Baran Kuhrt R. Philip, MD  Pager: [phone]

## 2024-07-17 NOTE — Progress Notes (Signed)
 This chaplain responded to PMT NP-Julia consult for creating/updating the Pt. Advance Directive. The chaplain reviewed the Pt. chart notes after receiving an update from the Pt. RN.  The Pt. is awake and anticipating a procedure. The Pt. is hopeful his wife will arrive before the procedure. AD education was started with the Pt.  Education was provided on how to complete an AD outside the hospital because the Pt. is anticipating d/c soon. The chaplain gave the Pt. a blank document to guide discussion with his wife.  This chaplain is available for F/U spiritual care as needed.  Chaplain Leeroy Hummer [phone]

## 2024-07-18 DIAGNOSIS — I5020 Unspecified systolic (congestive) heart failure: Secondary | ICD-10-CM | POA: Diagnosis not present

## 2024-07-19 DIAGNOSIS — I5020 Unspecified systolic (congestive) heart failure: Secondary | ICD-10-CM | POA: Diagnosis not present

## 2024-07-20 ENCOUNTER — Telehealth: Payer: Self-pay

## 2024-07-20 DIAGNOSIS — I5020 Unspecified systolic (congestive) heart failure: Secondary | ICD-10-CM | POA: Diagnosis not present

## 2024-07-20 LAB — FACTOR 5 LEIDEN

## 2024-07-20 NOTE — Transitions of Care (Post Inpatient/ED Visit) (Signed)
   07/20/2024  Name: James [name] MRN: [id number] DOB: 1960/12/23  Today's TOC FU Call Status: Today's TOC FU Call Status:: Unsuccessful Call (1st Attempt) Unsuccessful Call (1st Attempt) Date: 07/20/24  Attempted to reach the patient regarding the most recent Inpatient/ED visit.  Follow Up Plan: Additional outreach attempts will be made to reach the patient to complete the Transitions of Care (Post Inpatient/ED visit) call.   Unable to leave a voicemail message as voicemail had not been set up to phone number listed as preferred and for DPR to leave detailed message.  Richerd Fish, RN, BSN, CCM [facility], [facility] RN Care Manager Direct Dial: [phone]

## 2024-07-21 ENCOUNTER — Telehealth: Payer: Self-pay

## 2024-07-21 DIAGNOSIS — I5020 Unspecified systolic (congestive) heart failure: Secondary | ICD-10-CM | POA: Diagnosis not present

## 2024-07-21 NOTE — Transitions of Care (Post Inpatient/ED Visit) (Signed)
 07/21/2024  Name: James [name] MRN: [id number] DOB: 08-07-1961  Today's TOC FU Call Status: Today's TOC FU Call Status:: Successful TOC FU Call Completed TOC FU Call Complete Date: 07/21/24 Patient's Name and Date of Birth confirmed.  Transition Care Management Follow-up Telephone Call Date of Discharge: 07/17/24 How have you been since you were released from the hospital?: Better Any questions or concerns?: Yes Patient Questions/Concerns:: Yes, the people are supposed to come out and check this IV and the medicine going in it but it's beeping and looks like it's empty...it's supposed to last 7 days Patient Questions/Concerns Addressed: Other: (Contacted [facility] regarding issue)  Items Reviewed: Did you receive and understand the discharge instructions provided?: Yes Medications obtained,verified, and reconciled?: Yes (Medications Reviewed) Any new allergies since your discharge?: No Dietary orders reviewed?: Yes Type of Diet Ordered:: Heart healthy  Medications Reviewed Today: Medications Reviewed Today     Reviewed by Eilleen Richerd GRADE, RN (Registered Nurse) on 07/21/24 at 1546  Med List Status: <None>   Medication Order Taking? Sig Documenting Provider Last Dose Status Informant  acetaminophen  (TYLENOL ) 500 MG tablet [id number]  Take 1,000 mg by mouth every 6 (six) hours as needed for moderate pain. [provider]  Active Self, Pharmacy Records  albuterol  (VENTOLIN  HFA) 108 [phone] Base) MCG/ACT inhaler [id number]  INHALE 2 PUFFS BY MOUTH FOUR TIMES DAILY AS NEEDED Melvenia Manus BRAVO, MD  Active Self, Pharmacy Records  amiodarone (PACERONE) 200 MG tablet [id number]  Take 1 tablet (200 mg total) by mouth 2 (two) times daily. Marcine Catalan M, PA-C  Active   apixaban  (ELIQUIS ) 5 MG TABS tablet [id number]  Take 1 tablet (5 mg total) by mouth 2 (two) times daily. Waddell Danelle ORN, MD  Active Self, Pharmacy Records  atorvastatin  (LIPITOR) 40 MG tablet [id number]  Take 1  tablet (40 mg total) by mouth daily. Melvenia Manus BRAVO, MD  Active Self, Pharmacy Records  digoxin (LANOXIN) 0.125 MG tablet [id number]  Take 1 tablet (0.125 mg total) by mouth daily. Marcine Catalan M, PA-C  Active   FARXIGA  10 MG TABS tablet [id number]  TAKE 1 TABLET(10 MG) BY MOUTH DAILY BEFORE BREAKFAST Clegg, Amy D, NP  Active Self, Pharmacy Records  hydrALAZINE  (APRESOLINE ) 50 MG tablet [id number]  Take 1 tablet (50 mg total) by mouth every 8 (eight) hours. Marcine Catalan M, PA-C  Active   metFORMIN (GLUCOPHAGE) 500 MG tablet [id number]  TAKE 1 TABLET BY MOUTH TWICE DAILY Bevely Doffing, FNP  Active Self, Pharmacy Records  mexiletine (MEXITIL) 200 MG capsule [id number]  Take 1 capsule (200 mg total) by mouth every 8 (eight) hours. Marcine Catalan M, PA-C  Active   milrinone (PRIMACOR) 20 MG/100 ML SOLN infusion [id number]  Inject 0.0294 mg/min into the vein continuous. Marcine Catalan M, PA-C  Active   potassium chloride  SA (KLOR-CON  M) 20 MEQ tablet [id number]  Take 1 tablet (20 mEq total) by mouth daily. Marcine Catalan M, PA-C  Active   sodium chloride  flush (NS) 0.9 % injection 3 mL [id number]   Sabharwal, Aditya, DO  Active   spironolactone  (ALDACTONE ) 25 MG tablet [id number]  Take 1 tablet (25 mg total) by mouth at bedtime. Glena Harlene HERO, FNP  Active Self, Pharmacy Records  torsemide  (DEMADEX ) 20 MG tablet [id number]  Take 1 tablet (20 mg total) by mouth daily. Marcine Catalan HERO, PA-C  Active             Home Care and Equipment/Supplies:  Were Home Health Services Ordered?: Yes  Name of Home Health Agency:: [facility] (Possible [facility] through [facility] per CM notes) Has Agency set up a time to come to your home?: No (Patient not sure but thought it was today) EMR reviewed for Home Health Orders:  (This Clinical research associate contacted [facility] today) Any new equipment or medical supplies ordered?: Yes Name of Medical supply agency?: [facility] Were you able to get the  equipment/medical supplies?: Yes Do you have any questions related to the use of the equipment/supplies?: Yes What questions do you have?: Someone to come and check it  Functional Questionnaire: Do you need assistance with bathing/showering or dressing?: No Do you need assistance with meal preparation?: No Do you have difficulty maintaining continence: No Do you need assistance with getting out of bed/getting out of a chair/moving?: No Do you have difficulty managing or taking your medications?: No  Follow up appointments reviewed: PCP Follow-up appointment confirmed?: Yes Date of PCP follow-up appointment?: 07/30/24 Follow-up Provider: Leita Longs, FNP Specialist Hospital Follow-up appointment confirmed?: Yes Date of Specialist follow-up appointment?: 08/12/24 Follow-Up Specialty Provider:: Gardenia Led, DO  Cardiology Do you need transportation to your follow-up appointment?: No Do you understand care options if your condition(s) worsen?: Yes-patient verbalized understanding  SDOH Interventions Today    Flowsheet Row Most Recent Value  SDOH Interventions   Food Insecurity Interventions Intervention Not Indicated  Housing Interventions Intervention Not Indicated  Transportation Interventions Intervention Not Indicated  Utilities Interventions Intervention Not Indicated    Goals Addressed             This Visit's Progress    VBCI Transitions of Care (TOC) Care Plan       Problems:  Recent Hospitalization for treatment of CHF -  Patient is now milrinone dependent, he has re-complete advanced therapies workup.  Home Health services barrier: [facility] - patient's PICC and IV is beeping states no one has come out  Goal:  Over the next 30 days, the patient will not experience hospital readmission  Interventions:  Transitions of Care: Communication with [facility] re: Home IV Milrinone  Heart Failure Interventions: Basic overview and discussion of  pathophysiology of Heart Failure reviewed Advised patient to weigh each morning after emptying bladder Discussed importance of daily weight and advised patient to weigh and record daily Reviewed role of diuretics in prevention of fluid overload and management of heart failure; Discussed the importance of keeping all appointments with provider Screening for signs and symptoms of depression related to chronic disease state  Assessed social determinant of health barriers   Patient Self Care Activities:  Attend all scheduled provider appointments Call pharmacy for medication refills 3-7 days in advance of running out of medications Call provider office for new concerns or questions  Notify RN Care Manager of TOC call rescheduling needs Participate in Transition of Care Program/Attend TOC scheduled calls Take medications as prescribed    Plan:  The care management team will reach out to the patient again over the next 5- 7 business days. The patient has been provided with contact information for the care management team and has been advised to call with any health related questions or concerns.  The patient will call [facility] as advised to reach out if he has not heard back at 1236 pm this Clinical research associate contacted [facility] and spoke with Icara inpharmacy and transferred to Deb in nursing who states patient is on schedule with [facility] to see today they are to reach out to patient.  Discussed and offered 30 day TOC  program.  Patient accepted [facility] program for weekly follow up calls. The patient has been provided with contact information for the care management team and has been advised to call with any health -related questions or concerns.  The patient verbalized understanding with current plan of care.  The patient is directed to their insurance card regarding availability of benefits coverage.           Richerd Fish, RN, BSN, CCM [facility], [facility] Health RN Care  Manager Direct Dial: [phone]

## 2024-07-21 NOTE — Patient Instructions (Signed)
 Visit Information  Thank you for taking time to visit with me today. Please don't hesitate to contact me if I can be of assistance to you before our next scheduled telephone appointment.  Our next appointment is by telephone on 07/29/24 at 10:00 AM  Following is a copy of your care plan:   Goals Addressed             This Visit's Progress    VBCI Transitions of Care (TOC) Care Plan       Problems:  Recent Hospitalization for treatment of CHF -  Patient is now milrinone dependent, he has re-complete advanced therapies workup.  Home Health services barrier: [facility] - patient's PICC and IV is beeping states no one has come out  Goal:  Over the next 30 days, the patient will not experience hospital readmission  Interventions:  Transitions of Care: Communication with [facility] re: Home IV Milrinone  Heart Failure Interventions: Basic overview and discussion of pathophysiology of Heart Failure reviewed Advised patient to weigh each morning after emptying bladder Discussed importance of daily weight and advised patient to weigh and record daily Reviewed role of diuretics in prevention of fluid overload and management of heart failure; Discussed the importance of keeping all appointments with provider Screening for signs and symptoms of depression related to chronic disease state  Assessed social determinant of health barriers   Patient Self Care Activities:  Attend all scheduled provider appointments Call pharmacy for medication refills 3-7 days in advance of running out of medications Call provider office for new concerns or questions  Notify RN Care Manager of TOC call rescheduling needs Participate in Transition of Care Program/Attend TOC scheduled calls Take medications as prescribed    Plan:  The care management team will reach out to the patient again over the next 5- 7 business days. The patient has been provided with contact information for the care  management team and has been advised to call with any health related questions or concerns.  The patient will call [facility] as advised to reach out if he has not heard back at 1236 pm this Clinical research associate contacted [facility] and spoke with [facility] inpharmacy and transferred to Deb in nursing who states patient is on schedule with [facility] to see today they are to reach out to patient.  Discussed and offered 30 day TOC program.  Patient accepted [facility] program for weekly follow up calls. The patient has been provided with contact information for the care management team and has been advised to call with any health -related questions or concerns.  The patient verbalized understanding with current plan of care.  The patient is directed to their insurance card regarding availability of benefits coverage.          Patient verbalizes understanding of instructions and care plan provided today and agrees to view in MyChart. Active MyChart status and patient understanding of how to access instructions and care plan via MyChart confirmed with patient.     Telephone follow up appointment with care management team member scheduled for: The patient has been provided with contact information for the care management team and has been advised to call with any health related questions or concerns.  The care management team will reach out to the patient again over the next 5-10 business days.  Follow up with provider re: other medication refills and AVHF regarding Milrinone  Please call the care guide team at [phone] if you need to cancel or reschedule your appointment.  Please call the [place]  National Suicide Prevention Lifeline: 831-323-8636 or TTY: (229)793-4673 TTY 252-750-7743) to talk to a trained counselor if you are experiencing a Mental Health or Behavioral Health Crisis or need someone to talk to.   Richerd Fish, RN, BSN, CCM [facility], [facility] Health RN Care Manager Direct  Dial: [phone]

## 2024-07-21 NOTE — Transitions of Care (Post Inpatient/ED Visit) (Signed)
   07/21/2024  Name: Becky Berberian MRN: [id number] DOB: 12/24/1960  Today's TOC FU Call Status: Today's TOC FU Call Status:: Unsuccessful Call (2nd Attempt) Unsuccessful Call (2nd Attempt) Date: 07/21/24  Attempted to reach the patient regarding the most recent Inpatient/ED visit. Unable to leave voicemail message, returning call from patient left on this voicemail.  Follow Up Plan: Additional outreach attempts will be made to reach the patient to complete the Transitions of Care (Post Inpatient/ED visit) call.   Richerd Fish, RN, BSN, CCM [facility], [facility] RN Care Manager Direct Dial: [phone]

## 2024-07-22 ENCOUNTER — Telehealth (HOSPITAL_COMMUNITY): Payer: Self-pay

## 2024-07-22 DIAGNOSIS — I5020 Unspecified systolic (congestive) heart failure: Secondary | ICD-10-CM | POA: Diagnosis not present

## 2024-07-22 NOTE — Telephone Encounter (Signed)
 Called to confirm/remind patient of their appointment at the [facility] on 07/23/24.   Appointment:   [x] Confirmed  [] Left mess   [] No answer/No voice mail  [] VM Full/unable to leave message  [] Phone not in service  Patient reminded to bring all medications and/or complete list.  Confirmed patient has transportation. Gave directions, instructed to utilize valet parking.

## 2024-07-22 NOTE — Progress Notes (Signed)
 James [name]                                          MRN: [id number]   07/22/2024   The VBCI Quality Team Specialist reviewed this patient medical record for the purposes of chart review for care gap closure. The following were reviewed: abstraction for care gap closure-kidney health evaluation for diabetes:eGFR  and uACR.    VBCI Quality Team

## 2024-07-23 ENCOUNTER — Ambulatory Visit (HOSPITAL_COMMUNITY): Payer: Self-pay | Admitting: Physician Assistant

## 2024-07-23 ENCOUNTER — Other Ambulatory Visit (HOSPITAL_COMMUNITY): Payer: Self-pay

## 2024-07-23 ENCOUNTER — Ambulatory Visit (HOSPITAL_COMMUNITY)
Admit: 2024-07-23 | Discharge: 2024-07-23 | Disposition: A | Discharge status: Home / Self Care | Source: Ambulatory Visit | Attending: Physician Assistant | Admitting: Physician Assistant

## 2024-07-23 ENCOUNTER — Telehealth (HOSPITAL_COMMUNITY): Payer: Self-pay

## 2024-07-23 ENCOUNTER — Encounter (HOSPITAL_COMMUNITY): Payer: Self-pay

## 2024-07-23 VITALS — BP 120/78 | HR 88 | Ht 78.0 in | Wt 244.0 lb

## 2024-07-23 DIAGNOSIS — E1122 Type 2 diabetes mellitus with diabetic chronic kidney disease: Secondary | ICD-10-CM | POA: Diagnosis not present

## 2024-07-23 DIAGNOSIS — Z79899 Other long term (current) drug therapy: Secondary | ICD-10-CM | POA: Diagnosis not present

## 2024-07-23 DIAGNOSIS — Z923 Personal history of irradiation: Secondary | ICD-10-CM | POA: Insufficient documentation

## 2024-07-23 DIAGNOSIS — C61 Malignant neoplasm of prostate: Secondary | ICD-10-CM | POA: Diagnosis not present

## 2024-07-23 DIAGNOSIS — I130 Hypertensive heart and chronic kidney disease with heart failure and stage 1 through stage 4 chronic kidney disease, or unspecified chronic kidney disease: Secondary | ICD-10-CM | POA: Insufficient documentation

## 2024-07-23 DIAGNOSIS — I428 Other cardiomyopathies: Secondary | ICD-10-CM | POA: Diagnosis not present

## 2024-07-23 DIAGNOSIS — Z7901 Long term (current) use of anticoagulants: Secondary | ICD-10-CM | POA: Insufficient documentation

## 2024-07-23 DIAGNOSIS — I5022 Chronic systolic (congestive) heart failure: Secondary | ICD-10-CM | POA: Diagnosis not present

## 2024-07-23 DIAGNOSIS — I5020 Unspecified systolic (congestive) heart failure: Secondary | ICD-10-CM | POA: Diagnosis not present

## 2024-07-23 DIAGNOSIS — Z95810 Presence of automatic (implantable) cardiac defibrillator: Secondary | ICD-10-CM | POA: Insufficient documentation

## 2024-07-23 DIAGNOSIS — I480 Paroxysmal atrial fibrillation: Secondary | ICD-10-CM

## 2024-07-23 DIAGNOSIS — I493 Ventricular premature depolarization: Secondary | ICD-10-CM | POA: Insufficient documentation

## 2024-07-23 DIAGNOSIS — I110 Hypertensive heart disease with heart failure: Secondary | ICD-10-CM | POA: Diagnosis not present

## 2024-07-23 DIAGNOSIS — N183 Chronic kidney disease, stage 3 unspecified: Secondary | ICD-10-CM | POA: Diagnosis not present

## 2024-07-23 DIAGNOSIS — I4729 Other ventricular tachycardia: Secondary | ICD-10-CM

## 2024-07-23 DIAGNOSIS — Q2543 Congenital aneurysm of aorta: Secondary | ICD-10-CM | POA: Diagnosis not present

## 2024-07-23 DIAGNOSIS — Z7984 Long term (current) use of oral hypoglycemic drugs: Secondary | ICD-10-CM | POA: Diagnosis not present

## 2024-07-23 DIAGNOSIS — D320 Benign neoplasm of cerebral meninges: Secondary | ICD-10-CM | POA: Insufficient documentation

## 2024-07-23 DIAGNOSIS — N1831 Chronic kidney disease, stage 3a: Secondary | ICD-10-CM

## 2024-07-23 LAB — COMPREHENSIVE METABOLIC PANEL WITH GFR
ALT: 92 U/L — ABNORMAL HIGH (ref 0–44)
AST: 36 U/L (ref 15–41)
Albumin: 4 g/dL (ref 3.5–5.0)
Alkaline Phosphatase: 113 U/L (ref 38–126)
Anion gap: 13 (ref 5–15)
BUN: 28 mg/dL — ABNORMAL HIGH (ref 8–23)
CO2: 25 mmol/L (ref 22–32)
Calcium: 9.1 mg/dL (ref 8.9–10.3)
Chloride: 91 mmol/L — ABNORMAL LOW (ref 98–111)
Creatinine, Ser: 1.96 mg/dL — ABNORMAL HIGH (ref 0.61–1.24)
GFR, Estimated: 38 mL/min — ABNORMAL LOW (ref >60)
Glucose, Bld: 138 mg/dL — ABNORMAL HIGH (ref 70–99)
Potassium: 4.2 mmol/L (ref 3.5–5.1)
Sodium: 129 mmol/L — ABNORMAL LOW (ref 135–145)
Total Bilirubin: 2.7 mg/dL — ABNORMAL HIGH (ref 0.0–1.2)
Total Protein: 7.8 g/dL (ref 6.5–8.1)

## 2024-07-23 LAB — CBC
HCT: 50 % (ref 39.0–52.0)
Hemoglobin: 17.1 g/dL — ABNORMAL HIGH (ref 13.0–17.0)
MCH: 31.8 pg (ref 26.0–34.0)
MCHC: 34.2 g/dL (ref 30.0–36.0)
MCV: 92.9 fL (ref 80.0–100.0)
Platelets: 222 K/uL (ref 150–400)
RBC: 5.38 MIL/uL (ref 4.22–5.81)
RDW: 14.6 % (ref 11.5–15.5)
WBC: 10.1 K/uL (ref 4.0–10.5)
nRBC: 0 % (ref 0.0–0.2)

## 2024-07-23 LAB — BRAIN NATRIURETIC PEPTIDE: B Natriuretic Peptide: 595.8 pg/mL — ABNORMAL HIGH (ref 0.0–100.0)

## 2024-07-23 LAB — MAGNESIUM: Magnesium: 2.2 mg/dL (ref 1.7–2.4)

## 2024-07-23 MED ORDER — SACUBITRIL-VALSARTAN 24-26 MG PO TABS: 1.0000 | ORAL_TABLET | Freq: Two times a day (BID) | ORAL | 2 refills | Status: DC

## 2024-07-23 MED ORDER — AMIODARONE HCL 200 MG PO TABS: 200.0000 mg | ORAL_TABLET | Freq: Every day | ORAL | 5 refills | Status: DC

## 2024-07-23 NOTE — Progress Notes (Signed)
 ADVANCED HEART FAILURE CLINIC NOTE  Primary Care: James Doffing, FNP Primary Cardiologist: James Emmer, MD HF Cardiologist: James Commander, MD  HPI: James [name] is a 63 y.o. male with HFrEF/nonischemic cardiomyopathy s/p ICD, HTN, T2DM, VT, aortic root aneurysm, prostate cancer s/p EBRT and ADT, meningioma. HF dates back to 2011 when he was diagnosed with nonischemic cardiomyopathy.    He came to the [facility] system in 2013 when he had a left heart cath with no coronary disease with a EF of 20%, he was started on low-dose GDMT at that time.  By 2013 his EF improved to 45%.  He followed yearly with Dr. Nishan until 2022 when he once again had a reduction in LVEF to 20 to 25%. Repeat heart cath demonstrated no significant coronary disease.    Since establishing care with us , [name] has undergone evaluation for advanced therapies.  His CPX is suggestive of severe heart failure limitation and multiple right heart catheterizations demonstrate persistently reduced cardiac index less than 2 L/min/m.    Completed radiation for prostate cancer April 2025. He was also referred to Neurology for mass noted on CT of head.   Echo 7/25 showed EF  25-30%, G3DD, RV mildly reduced, moderate MR  Admitted 07/09/24 with cardiogenic shock. Previous admission within the last year for similar decompensation and was worked of for transplant/LVAD in 2024. Presented with lactic acidosis, AKI, and shock liver. Echo showed EF 10-15% with mod reduced RV. He was started on Milrinone and aggressively diuresed with improvement of AKI and liver function. Failed attempt to wean from milrinone. Course further complicated by high PVC and NSVT burden, which has been decreased with mexiletine and amiodarone.   Given that this is his second [facility] admission in the last year and now milrinone dependent, repeated advanced therapies workup. He will go home with home milrinone with follow up at [facility] for transplant  evaluation. If he is not a candidate for transplant, plan is to return to [facility] for LVAD implant.   He is here today for close post-hospital CHF follow-up. Energy level has improved quite a bit over the last few days. Has been ambulating around the yard but not engaging in any strenuous activity. Only major complaint is nausea and decreased appetite. No vomiting or diarrhea. Weight up 4 lb from discharge (but has on shoes and more clothing). No dyspnea, orthopnea, PND or lower extremity edema. Denies dizziness. Taking all medications as prescribed.   Past Medical History:  Diagnosis Date   CKD (chronic kidney disease) stage 2, GFR 60-89 ml/min    COPD (chronic obstructive pulmonary disease) (HCC)    Diabetes mellitus type II, non insulin  dependent (HCC)    Dilated aortic root    Elevated PSA    Hypercholesteremia    Hypertension    NICM (nonischemic cardiomyopathy) (HCC)    NSVT (nonsustained ventricular tachycardia) (HCC)    Obstructive sleep apnea    Paroxysmal atrial fibrillation (HCC)    Pulmonary hypertension (HCC)    PVC's (premature ventricular contractions)    Tobacco abuse    Current Outpatient Medications  Medication Sig Dispense Refill   acetaminophen  (TYLENOL ) 500 MG tablet Take 1,000 mg by mouth every 6 (six) hours as needed for moderate pain.     albuterol  (VENTOLIN  HFA) 108 (90 Base) MCG/ACT inhaler INHALE 2 PUFFS BY MOUTH FOUR TIMES DAILY AS NEEDED 6.7 g 1   apixaban  (ELIQUIS ) 5 MG TABS tablet Take 1 tablet (5 mg total) by mouth 2 (  two) times daily. 60 tablet 11   atorvastatin  (LIPITOR) 40 MG tablet Take 1 tablet (40 mg total) by mouth daily. 90 tablet 3   digoxin (LANOXIN) 0.125 MG tablet Take 1 tablet (0.125 mg total) by mouth daily. 30 tablet 5   FARXIGA  10 MG TABS tablet TAKE 1 TABLET(10 MG) BY MOUTH DAILY BEFORE BREAKFAST 90 tablet 3   hydrALAZINE  (APRESOLINE ) 50 MG tablet Take 1 tablet (50 mg total) by mouth every 8 (eight) hours. 90 tablet 5   metFORMIN  (GLUCOPHAGE) 500 MG tablet TAKE 1 TABLET BY MOUTH TWICE DAILY 180 tablet 1   mexiletine (MEXITIL) 200 MG capsule Take 1 capsule (200 mg total) by mouth every 8 (eight) hours. 90 capsule 5   milrinone (PRIMACOR) 20 MG/100 ML SOLN infusion Inject 0.0294 mg/min into the vein continuous.     potassium chloride  SA (KLOR-CON  M) 20 MEQ tablet Take 1 tablet (20 mEq total) by mouth daily. 30 tablet 3   spironolactone  (ALDACTONE ) 25 MG tablet Take 1 tablet (25 mg total) by mouth at bedtime. 90 tablet 3   torsemide  (DEMADEX ) 20 MG tablet Take 1 tablet (20 mg total) by mouth daily. 30 tablet 3   amiodarone (PACERONE) 200 MG tablet Take 1 tablet (200 mg total) by mouth daily. 60 tablet 5   No current facility-administered medications for this encounter.   Facility-Administered Medications Ordered in Other Encounters  Medication Dose Route Frequency Provider Last Rate Last Admin   sodium chloride  flush (NS) 0.9 % injection 3 mL  3 mL Intravenous Q12H James [name], Aditya, DO        BP 120/78   Pulse 88   Ht 6' 6 (1.981 m)   Wt 110.7 kg (244 lb)   SpO2 97%   BMI 28.20 kg/m   Wt Readings from Last 3 Encounters:  07/23/24 110.7 kg (244 lb)  07/21/24 109.8 kg (242 lb)  07/17/24 109.2 kg (240 lb 12.8 oz)    PHYSICAL EXAM: General: Ambulated into clinic. No distress.  Cor: JVP 6-7 cm. Regular rate & rhythm. No murmurs. Tunneled PICC R upper chest. Lungs: clear Abdomen: soft, nontender, nondistended.  Extremities: no edema Neuro: alert & oriented x 3. Affect pleasant  ECG SR 83 bpm, 2 PVCs   ASSESSMENT & PLAN:  1. Chronic systolic CHF: Shock resolved. Long history of nonischemic cardiomyopathy. Has Biotronik ICD.  History of low output on RHC and poor CPX but with minimal symptoms in the past.  Considering advanced therapies, has preferred transplant.  No smoking since 09/15/23.  Has controlled prostate cancer.  He was admitted to [facility] with volume overload and signs of low output HF AKI on CKD stage  3 and elevated lactate. Started on Milrinone and Lasix  gtt. Diuresed. Failed milrinone wean. Remains inotrope dependent. Remains on Milrinone at 0.25 mcg/kg/min. HH coming out to house today to perform dressing change. - NYHA III, improved on milrinone. Volume okay on exam. Continue Torsemide  20 mg daily - Check CO-OX given complaints of nausea and decreased appetite but suspect this may be d/t po amiodarone. Will also check CBC, CMET, magnesium  level and BNP. - continue digoxin 0.125 mcg/kg/min - continue dapagliflozin  10 mg daily - continue spiro 25 mg daily - continue hydral 50 mg tid - Add back entresto  24/26 mg BID (stopped during recent hospitalization d/t AKI) - Beta blocker on hold with low output/CGS.  - Will need ongoing workup for advanced therapies. Dr. Bensimhon has discussed with [facility]. He will see [facility] for transplant evaluation.  VAD coordinators spoke with transplant coordinator at [facility] today. Insurance just approved transplant evaluation. He will be contacted today to arrange an appointment. In the meantime, will continue inotropic support with milrinone. If he is not transplant candidate, we will continue with plan for VAD.    2. CKD stage 3: Recent AKI w/ Scr up to 2.3, improved to 1.5 prior to discharge. Suspect cardiorenal.  -  Labs today  3. PVCs/NSVT: Frequent on milrinone.  - continue mexilitine 200 mg tid - On amio 200 mg bid, reduce dose to 200 mg daily d/t nausea and decreased appetite. Discussed with Dr. Bensimhon. See discussion above.  - Baseline TSH okay, FreeT4 mildly elevated. Will need to follow. Check LFTs today. - has ICD - no ability to interrogate Biotronik device in our clinic. Followed by device clinic   4. Atrial fibrillation: Paroxysmal. SR on ECG today - Continue Eliquis . Does not need aspirin .    5. Prostate cancer: completed EBRT 4/25. Completed ST ADT.    6. Meningioma. Noted on CT 5/25, seen neurosx. CT scan during recent admission w/ stable size.       Follow-up: As scheduled 10/29 with Dr. Gardenia, he was instructed to call sooner if nausea/appetite do not improve with lower dose of amiodarone. Will see sooner if needed.   Earle Troiano N PA-C 10:05 AM 07/23/24

## 2024-07-23 NOTE — Telephone Encounter (Signed)
 Advanced Heart Failure Patient Advocate Encounter  Test billing for this patient's current coverage ([employer] [place] ) returns a $0 copay for 90 day supply of Sacubitril -Valsartan .  This test claim was processed through [facility]- copay amounts may vary at other pharmacies due to pharmacy/plan contracts, or as the patient moves through the different stages of their insurance plan.  Rachel DEL, CPhT Rx Patient Advocate Phone: [phone]

## 2024-07-23 NOTE — Patient Instructions (Addendum)
 Good to see you today!  START Entresto  24/26 mg(1 tablet) Twice daily  DECREASE Amiodarone to 200 mg  daily  Labs done today, your results will be available in MyChart, we will contact you for abnormal readings.  Your physician recommends that you schedule a follow-up appointment as scheduled  If you have any questions or concerns before your next appointment please send us  a message through [employer] or call our office at [phone].    TO LEAVE A MESSAGE FOR THE NURSE SELECT OPTION 2, PLEASE LEAVE A MESSAGE INCLUDING: YOUR NAME DATE OF BIRTH CALL BACK NUMBER REASON FOR CALL**this is important as we prioritize the call backs  YOU WILL RECEIVE A CALL BACK THE SAME DAY AS LONG AS YOU CALL BEFORE 4:00 PM At the Advanced Heart Failure Clinic, you and your health needs are our priority. As part of our continuing mission to provide you with exceptional heart care, we have created designated Provider Care Teams. These Care Teams include your primary Cardiologist (physician) and Advanced Practice Providers (APPs- Physician Assistants and Nurse Practitioners) who all work together to provide you with the care you need, when you need it.   You may see any of the following providers on your designated Care Team at your next follow up: Dr Toribio Fuel Dr Ezra Shuck Dr. Ria Commander Dr. Morene Brownie Amy Lenetta, NP Caffie Shed, [place] [facility] Addy, [place] Beckey Coe, NP [name], NP Ellouise Class, NP Tinnie Redman, PharmD Jaun Bash, PharmD   Please be sure to bring in all your medications bottles to every appointment.    Thank you for choosing [facility] Failure [facility]

## 2024-07-23 NOTE — Progress Notes (Signed)
 Blood draw from PICC for co-ox/cmet/mag/cbc per Manuelita Dutch, PA  Powell Latino, RN, BSN, [facility] Specialty Coordinator Advanced Heart Failure Clinic

## 2024-07-24 DIAGNOSIS — I5020 Unspecified systolic (congestive) heart failure: Secondary | ICD-10-CM | POA: Diagnosis not present

## 2024-07-25 DIAGNOSIS — I5020 Unspecified systolic (congestive) heart failure: Secondary | ICD-10-CM | POA: Diagnosis not present

## 2024-07-26 DIAGNOSIS — I5020 Unspecified systolic (congestive) heart failure: Secondary | ICD-10-CM | POA: Diagnosis not present

## 2024-07-27 DIAGNOSIS — I5020 Unspecified systolic (congestive) heart failure: Secondary | ICD-10-CM | POA: Diagnosis not present

## 2024-07-28 ENCOUNTER — Other Ambulatory Visit (HOSPITAL_COMMUNITY): Payer: Self-pay

## 2024-07-28 DIAGNOSIS — I5020 Unspecified systolic (congestive) heart failure: Secondary | ICD-10-CM | POA: Diagnosis not present

## 2024-07-29 ENCOUNTER — Telehealth: Payer: Self-pay

## 2024-07-29 ENCOUNTER — Other Ambulatory Visit: Payer: Self-pay

## 2024-07-29 DIAGNOSIS — I5020 Unspecified systolic (congestive) heart failure: Secondary | ICD-10-CM | POA: Diagnosis not present

## 2024-07-29 NOTE — Transitions of Care (Post Inpatient/ED Visit) (Unsigned)
 Transition of Care week 2  Visit Note  07/29/2024  Name: James [name] MRN: [id number]          DOB: 08/08/1961  Situation: Patient enrolled in [facility] 30-day program. Visit completed with patient by telephone.   Background:   Initial Transition Care Management Follow-up Telephone Call Discharge Date and Diagnosis: 07/17/24, Acute Congestive Heart Failure   Past Medical History:  Diagnosis Date   CKD (chronic kidney disease) stage 2, GFR 60-89 ml/min    COPD (chronic obstructive pulmonary disease) (HCC)    Diabetes mellitus type II, non insulin  dependent (HCC)    Dilated aortic root    Elevated PSA    Hypercholesteremia    Hypertension    NICM (nonischemic cardiomyopathy) (HCC)    NSVT (nonsustained ventricular tachycardia) (HCC)    Obstructive sleep apnea    Paroxysmal atrial fibrillation (HCC)    Pulmonary hypertension (HCC)    PVC's (premature ventricular contractions)    Tobacco abuse     Assessment: Patient Reported Symptoms: Cognitive Cognitive Status: Able to follow simple commands, Alert and oriented to person, place, and time      Neurological Neurological Review of Symptoms: No symptoms reported    HEENT HEENT Symptoms Reported: No symptoms reported      Cardiovascular Cardiovascular Symptoms Reported: No symptoms reported (Denies ALL symptoms) Does patient have uncontrolled Hypertension?: Yes Is patient checking Blood Pressure at home?: Yes Patient's Recent BP reading at home: 120/78 from yesterday 10/14 (* BP reading today showed 145/105 as well as wife's BP was extremely high with the BP cuff.  Patient states, there is no way my BP is like this even at my sickest it has never shown high reading and my wife has low BPs.) Cardiovascular Management Strategies: Medication therapy, Medical device  Respiratory Respiratory Symptoms Reported: No symptoms reported    Endocrine Endocrine Symptoms Reported: No symptoms reported Is patient diabetic?: Yes Is  patient checking blood sugars at home?: Yes List most recent blood sugar readings, include date and time of day: 120 07/28/24 before breakfast Endocrine Self-Management Outcome: 4 (good)  Gastrointestinal Gastrointestinal Symptoms Reported: No symptoms reported      Genitourinary Genitourinary Symptoms Reported: No symptoms reported Genitourinary Management Strategies: Medication therapy  Integumentary Integumentary Symptoms Reported: No symptoms reported    Musculoskeletal Musculoskelatal Symptoms Reviewed: No symptoms reported Musculoskeletal Management Strategies: Activity, Diet modification Musculoskeletal Self-Management Outcome: 4 (good)      Psychosocial Psychosocial Symptoms Reported: No symptoms reported         There were no vitals filed for this visit.  Please see note in cardiovascular assessment.   Medications Reviewed Today     Reviewed by Eilleen Richerd GRADE, RN (Registered Nurse) on 07/29/24 at 1028  Med List Status: <None>   Medication Order Taking? Sig Documenting Provider Last Dose Status Informant  acetaminophen  (TYLENOL ) 500 MG tablet [id number] Yes Take 1,000 mg by mouth every 6 (six) hours as needed for moderate pain. [provider]  Active Self, Pharmacy Records  albuterol  (VENTOLIN  HFA) 108 [phone] Base) MCG/ACT inhaler [id number]  INHALE 2 PUFFS BY MOUTH FOUR TIMES DAILY AS NEEDED Dixon, Phillip E, MD  Active Self, Pharmacy Records           Med Note [place], RICHERD GRADE [place] Jul 29, 2024 10:11 AM) Only as needed  amiodarone (PACERONE) 200 MG tablet [id number] Yes Take 1 tablet (200 mg total) by mouth daily. Colletta Manuelita Garre, PA-C  Active   apixaban  (ELIQUIS ) 5 MG  TABS tablet [id number] Yes Take 1 tablet (5 mg total) by mouth 2 (two) times daily. Waddell Danelle ORN, MD  Active Self, Pharmacy Records  atorvastatin  (LIPITOR) 40 MG tablet [id number] Yes Take 1 tablet (40 mg total) by mouth daily. Melvenia Manus BRAVO, MD  Active Self, Pharmacy Records  digoxin  (LANOXIN) 0.125 MG tablet [id number] Yes Take 1 tablet (0.125 mg total) by mouth daily. Marcine Catalan M, PA-C  Active   FARXIGA  10 MG TABS tablet [id number] Yes TAKE 1 TABLET(10 MG) BY MOUTH DAILY BEFORE BREAKFAST Clegg, Amy D, NP  Active Self, Pharmacy Records  hydrALAZINE  (APRESOLINE ) 50 MG tablet [id number] Yes Take 1 tablet (50 mg total) by mouth every 8 (eight) hours. Marcine Catalan M, PA-C  Active   metFORMIN (GLUCOPHAGE) 500 MG tablet [id number] Yes TAKE 1 TABLET BY MOUTH TWICE DAILY Bevely Doffing, FNP  Active Self, Pharmacy Records  mexiletine (MEXITIL) 200 MG capsule [id number] Yes Take 1 capsule (200 mg total) by mouth every 8 (eight) hours. Marcine Catalan M, PA-C  Active   milrinone (PRIMACOR) 20 MG/100 ML SOLN infusion [id number] Yes Inject 0.0294 mg/min into the vein continuous. Marcine Catalan M, PA-C  Active   potassium chloride  SA (KLOR-CON  M) 20 MEQ tablet [id number] Yes Take 1 tablet (20 mEq total) by mouth daily. Marcine Catalan M, PA-C  Active   sacubitril -valsartan  (ENTRESTO ) 24-26 MG [id number]  Take 1 tablet by mouth 2 (two) times daily. Colletta Manuelita Garre, PA-C  Active            Med Note LESLY, RICHERD CINDERELLA [place] Jul 29, 2024 10:27 AM) Currently on hold  sodium chloride  flush (NS) 0.9 % injection 3 mL [id number]   Sabharwal, Aditya, DO  Active   spironolactone  (ALDACTONE ) 25 MG tablet [id number] Yes Take 1 tablet (25 mg total) by mouth at bedtime. [place], Harlene HERO, FNP  Active Self, Pharmacy Records  torsemide  (DEMADEX ) 20 MG tablet [id number] Yes Take 1 tablet (20 mg total) by mouth daily. Marcine Catalan HERO, PA-C  Active             Recommendation:  Patient is to contact PCP or cardiology or 911 if having any symptoms of dizziness, lethargy, shortness of breath, chest pain. Wife verbalized understanding as well.  All symptoms denied. PCP Follow-up Continue Current Plan of Care  Follow Up Plan:   Telephone follow-up in 1 week  RICHERD Fish, RN,  Scientist, research (physical sciences), CCM [facility], [facility] RN Care Manager Direct Dial: [phone]

## 2024-07-29 NOTE — Patient Instructions (Signed)
 Visit Information  Thank you for taking time to visit with me today. Please don't hesitate to contact me if I can be of assistance to you before our next scheduled telephone appointment.  Our next appointment is by telephone on 08/05/24  at 1300  Following is a copy of your care plan:   Goals Addressed             This Visit's Progress    VBCI Transitions of Care (TOC) Care Plan   On track    Problems:  Recent Hospitalization for treatment of CHF -  Patient is now milrinone dependent, he has re-complete advanced therapies workup.  Home Health services barrier: [facility] - patient's PICC and IV is beeping states no one has come out 07/29/24 BP cuff not working correctly, patient to take to PCP/Cardiology appointment  07/30/24 to check calibration.  Goal:  Over the next 30 days, the patient will not experience hospital readmission  Interventions:  Transitions of Care: Communication with [facility] re: Home IV Milrinone 07/29/24 Reviewed weight, diet and blood glucose monitoring importance and ongoing needs. Patient states Entresto  is on pause until seeing Cardiology appointment 07/30/24  Heart Failure Interventions: Basic overview and discussion of pathophysiology of Heart Failure reviewed Advised patient to weigh each morning after emptying bladder Discussed importance of daily weight and advised patient to weigh and record daily Reviewed role of diuretics in prevention of fluid overload and management of heart failure; Discussed the importance of keeping all appointments with provider Screening for signs and symptoms of depression related to chronic disease state  Assessed social determinant of health barriers   Patient Self Care Activities:  Attend all scheduled provider appointments Call pharmacy for medication refills 3-7 days in advance of running out of medications Call provider office for new concerns or questions  Notify RN Care Manager of TOC call  rescheduling needs Participate in Transition of Care Program/Attend TOC scheduled calls Take medications as prescribed    Plan:  The care management team will reach out to the patient again over the next 5- 7 business days. The patient has been provided with contact information for the care management team and has been advised to call with any health related questions or concerns.  The patient will call [facility] as advised to reach out if he has not heard back at 1236 pm this Clinical research associate contacted Engineer, drilling and spoke with Icara in pharmacy and transferred to Deb in nursing who states patient is on schedule with [facility] to see today they are to reach out to patient.  Discussed and offered 30 day TOC program.  Patient accepted [facility] program for weekly follow up calls. The patient has been provided with contact information for the care management team and has been advised to call with any health -related questions or concerns.  The patient verbalized understanding with current plan of care.  The patient is directed to their insurance card regarding availability of benefits coverage.          Patient verbalizes understanding of instructions and care plan provided today and agrees to view in MyChart. Active MyChart status and patient understanding of how to access instructions and care plan via MyChart confirmed with patient.     The patient has been provided with contact information for the care management team and has been advised to call with any health related questions or concerns.  The care management team will reach out to the patient again over the next 5- 10 business days.  Follow up with provider re: Entresto  and BP cuff for calibration needs.  Please call the care guide team at [phone] if you need to cancel or reschedule your appointment.   Please call the [place]  National Suicide Prevention Lifeline: 628-489-0078 or TTY: 951-668-4328 TTY (469)076-0662) to talk to a trained counselor if you are  experiencing a Mental Health or Behavioral Health Crisis or need someone to talk to.  Richerd Fish, RN, BSN, CCM [facility], [facility] RN Care Manager Direct Dial: [phone]

## 2024-07-30 ENCOUNTER — Ambulatory Visit (HOSPITAL_COMMUNITY)
Admission: RE | Admit: 2024-07-30 | Discharge: 2024-07-30 | Disposition: A | Discharge status: Home / Self Care | Source: Ambulatory Visit | Attending: Internal Medicine | Admitting: Internal Medicine

## 2024-07-30 ENCOUNTER — Ambulatory Visit (HOSPITAL_COMMUNITY): Payer: Self-pay | Admitting: Physician Assistant

## 2024-07-30 ENCOUNTER — Ambulatory Visit: Payer: Self-pay

## 2024-07-30 VITALS — BP 123/73 | Ht 78.0 in | Wt 249.0 lb

## 2024-07-30 DIAGNOSIS — Z7984 Long term (current) use of oral hypoglycemic drugs: Secondary | ICD-10-CM

## 2024-07-30 DIAGNOSIS — I5022 Chronic systolic (congestive) heart failure: Secondary | ICD-10-CM | POA: Diagnosis not present

## 2024-07-30 DIAGNOSIS — I5020 Unspecified systolic (congestive) heart failure: Secondary | ICD-10-CM | POA: Diagnosis not present

## 2024-07-30 DIAGNOSIS — E119 Type 2 diabetes mellitus without complications: Secondary | ICD-10-CM | POA: Diagnosis not present

## 2024-07-30 DIAGNOSIS — Z09 Encounter for follow-up examination after completed treatment for conditions other than malignant neoplasm: Secondary | ICD-10-CM | POA: Diagnosis not present

## 2024-07-30 LAB — BASIC METABOLIC PANEL WITH GFR
Anion gap: 13 (ref 5–15)
BUN: 18 mg/dL (ref 8–23)
CO2: 24 mmol/L (ref 22–32)
Calcium: 9 mg/dL (ref 8.9–10.3)
Chloride: 95 mmol/L — ABNORMAL LOW (ref 98–111)
Creatinine, Ser: 1.59 mg/dL — ABNORMAL HIGH (ref 0.61–1.24)
GFR, Estimated: 48 mL/min — ABNORMAL LOW (ref >60)
Glucose, Bld: 160 mg/dL — ABNORMAL HIGH (ref 70–99)
Potassium: 4.1 mmol/L (ref 3.5–5.1)
Sodium: 132 mmol/L — ABNORMAL LOW (ref 135–145)

## 2024-07-30 LAB — GLUCOSE, POCT (MANUAL RESULT ENTRY): POC Glucose: 203 mg/dL — AB (ref 70–99)

## 2024-07-30 NOTE — Progress Notes (Signed)
 Established Patient Office Visit  Subjective   Patient ID: James [name], male    DOB: 02-10-1961  Age: 63 y.o. MRN: [id number]  Chief Complaint  Patient presents with   Hospitalization Follow-up    Pt here for hospital follow up    HPI  Pt was admitted to [facility] from 07/09/24-07/17/24 for following:   Primary Discharge Diagnoses:  Cardiogenic Shock Acute on chronic systolic heart failure PVCs/NSVT   Secondary Discharge Diagnoses:  AKI on CKD 3b Atrial Fibrillation Prostate Cancer Meningioma Tranaminities Hypokalemia   Hospital Course:   [name] is a 63 y.o. male with history nonischemic cardiomyopathy s/p ICD, atrial fibrillation, VT, HTN, T2DM, aortic root aneurysm, prostate cancer s/p EBRT and ADT, meningioma.    Admitted 07/09/24 with cardiogenic shock. Previous admission 6 months ago for similar decompensation and was worked of for transplant/LVAD. Presented with lactic acidosis, AKI, and liver shock. Echo this admit showed EF of 10-15% with mod reduced RV. He was started on Milrinone and aggressively diuresed with improvement of AKI and liver function. Failed attempt to wean from milrinone. Will plan to go home with home milrinone, tunneled PICC line placed. Course has also been complicated by high PVC and NSVT burden, which has been decreased with mexiletine and amiodarone.   Given that this is his second [facility] admission in the last year and now milrinone dependent, he has re-complete advanced therapies workup. He will go home with home milrinone with follow up at [facility] for transplant evaluation. If he is not a candidate for transplant, plan is to return to [facility] for LVAD implant.    Seen today by Dr. Cherrie and deemed appropriate for discharge home with home milrinone and close follow up with [facility] and [facility].   Discharge Weight: 240 lb Discharge Vitals: Blood pressure (!) 136/96, pulse 83, temperature 97.8 F (36.6 C), temperature source  Oral, resp. rate 19, height 6' 6 (1.981 m), weight 109.2 kg, SpO2 97%.  Discussed the use of AI scribe software for clinical note transcription with the patient, who gave verbal consent to proceed.  History of Present Illness    James [name] is a 63 year old male who presents for follow-up after recent hospitalization for heart issues.  Congestive heart failure management - Recent hospitalization for heart failure. - Currently has a PICC line in place for medication administration - Receiving Milrinone infusion via PICC line - Possible consideration for LVAD placement discussed - Upcoming appointment with [facility] to determine if he is a heart transplant candidate.    Visual disturbance - Blurred vision onset yesterday around 5 PM - Blurred vision has persisted into today with slight improvement - No blood glucose check performed since onset of symptoms - Suspects possible relationship to blood glucose levels  Medication adherence - Did not take metformin this morning due to rushing to an appointment - No other medications taken today  Dietary intake - Ate an egg biscuit approximately ten minutes before leaving for [place] this morning     Patient Active Problem List   Diagnosis Date Noted   Acute renal failure superimposed on chronic kidney disease 07/09/2024   Elevated troponin 07/09/2024   Mass of brain 03/24/2024   Vitamin D  deficiency 12/02/2023   Malignant neoplasm of prostate (HCC) 11/17/2023   Paroxysmal atrial fibrillation (HCC) 10/09/2023   Bilateral lower extremity edema 09/25/2023   Vertigo 08/29/2023   Elevated PSA 06/13/2023   Abnormal echocardiogram 08/24/2022   Stage 3a  chronic kidney disease (HCC) 08/24/2022   HFrEF (heart failure with reduced ejection fraction) (HCC) 08/23/2022   Asthma 07/30/2022   Encounter to establish care 07/30/2022   Acute congestive heart failure (HCC) 07/22/2022   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   NSVT (nonsustained  ventricular tachycardia) 07/21/2021   Pulmonary hypertension, unspecified (HCC) 07/21/2021   Tobacco abuse 07/21/2021   Diabetes mellitus type 2 in nonobese (HCC) 07/21/2021   Acute on chronic combined systolic and diastolic congestive heart failure (HCC)    Non-recurrent unilateral inguinal hernia without obstruction or gangrene    Perforation bowel (HCC) 06/19/2014   S/P colonoscopy with polypectomy 06/18/2014   Hyperlipidemia 11/04/2012   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   Cholelithiasis    Obstructive sleep apnea 04/28/2012   Cardiomyopathy, nonischemic (HCC) 04/03/2012   Essential hypertension 08/07/2010   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/07/2010    ROS    Objective:     BP 123/73   Ht 6' 6 (1.981 m)   Wt 249 lb (112.9 kg)   BMI 28.77 kg/m  BP Readings from Last 3 Encounters:  07/30/24 123/73  07/23/24 120/78  07/21/24 119/82   Wt Readings from Last 3 Encounters:  07/30/24 249 lb (112.9 kg)  07/23/24 244 lb (110.7 kg)  07/21/24 242 lb (109.8 kg)      Physical Exam Vitals and nursing note reviewed.  Constitutional:      Appearance: Normal appearance.  HENT:     Head: Normocephalic.  Eyes:     Extraocular Movements: Extraocular movements intact.     Pupils: Pupils are equal, round, and reactive to light.  Cardiovascular:     Rate and Rhythm: Normal rate and regular rhythm.  Pulmonary:     Effort: Pulmonary effort is normal.     Breath sounds: Normal breath sounds.  Musculoskeletal:     Cervical back: Normal range of motion and neck supple.     Right lower leg: No edema.     Left lower leg: No edema.  Neurological:     Mental Status: He is alert and oriented to person, place, and time.  Psychiatric:        Mood and Affect: Mood normal.        Thought Content: Thought content normal.    Results for orders placed or performed in visit on 07/30/24  POCT Glucose (CBG)  Result Value Ref Range   POC Glucose 203 (A) 70 - 99 mg/dl   Results for orders placed or performed during the hospital encounter of 07/30/24  Basic Metabolic Panel (BMET)  Result Value Ref Range   Sodium 132 (L) 135 - 145 mmol/L   Potassium 4.1 3.5 - 5.1 mmol/L   Chloride 95 (L) 98 - 111 mmol/L   CO2 24 22 - 32 mmol/L   Glucose, Bld 160 (H) 70 - 99 mg/dL   BUN 18 8 - 23 mg/dL   Creatinine, Ser 8.40 (H) 0.61 - 1.24 mg/dL   Calcium  9.0 8.9 - 10.3 mg/dL   GFR, Estimated 48 (L) >60 mL/min   Anion gap 13 5 - 15      The 10-year ASCVD risk score (Arnett DK, et al., 2019) is: 25.1%    Assessment & Plan:   Problem List Items Addressed This Visit       Cardiovascular and Mediastinum   HFrEF (heart failure with reduced ejection fraction) (HCC) (Chronic)   Recent hospitalization records reviewed.   Chronic heart failure managed with inotropic support via PICC line. Receiving milrinone infusion.  Awaiting further evaluation at [facility] for possible heart transplant.         Endocrine   Diabetes mellitus type 2 in nonobese [facility]) - Primary   Type 2 diabetes with recent blurred vision episodes since yesterday. Missed metformin dose today. - Blood glucose level in office today was 203.  Do not suspect blood sugar issues as cause of blurred vision.   May be related to milrinone infusion.  Recommend discussing with cardiology at upcoming appt.       Relevant Orders   POCT Glucose (CBG) (Completed)   Other Visit Diagnoses       Hospital discharge follow-up           Return in about 3 months (around 10/30/2024) for chronic follow-up with PCP.    Leita Longs, FNP

## 2024-07-30 NOTE — Assessment & Plan Note (Signed)
 Recent hospitalization records reviewed.   Chronic heart failure managed with inotropic support via PICC line. Receiving milrinone infusion. Awaiting further evaluation at [facility] for possible heart transplant.

## 2024-07-30 NOTE — Assessment & Plan Note (Signed)
 Type 2 diabetes with recent blurred vision episodes since yesterday. Missed metformin dose today. - Blood glucose level in office today was 203.  Do not suspect blood sugar issues as cause of blurred vision.   May be related to milrinone infusion.  Recommend discussing with cardiology at upcoming appt.

## 2024-07-31 DIAGNOSIS — I5020 Unspecified systolic (congestive) heart failure: Secondary | ICD-10-CM | POA: Diagnosis not present

## 2024-08-01 DIAGNOSIS — I5020 Unspecified systolic (congestive) heart failure: Secondary | ICD-10-CM | POA: Diagnosis not present

## 2024-08-02 DIAGNOSIS — I5020 Unspecified systolic (congestive) heart failure: Secondary | ICD-10-CM | POA: Diagnosis not present

## 2024-08-03 ENCOUNTER — Telehealth (HOSPITAL_COMMUNITY): Payer: Self-pay | Admitting: *Deleted

## 2024-08-03 DIAGNOSIS — I5020 Unspecified systolic (congestive) heart failure: Secondary | ICD-10-CM | POA: Diagnosis not present

## 2024-08-03 NOTE — Telephone Encounter (Signed)
 STD forms completed with RTW date TBD ~3-6 months, signed by Dr Cherrie, and faxed with required records to [facility] at [phone]

## 2024-08-04 DIAGNOSIS — I5020 Unspecified systolic (congestive) heart failure: Secondary | ICD-10-CM | POA: Diagnosis not present

## 2024-08-05 ENCOUNTER — Telehealth: Payer: Self-pay

## 2024-08-05 ENCOUNTER — Other Ambulatory Visit: Payer: Self-pay

## 2024-08-05 DIAGNOSIS — I5020 Unspecified systolic (congestive) heart failure: Secondary | ICD-10-CM | POA: Diagnosis not present

## 2024-08-05 NOTE — Transitions of Care (Post Inpatient/ED Visit) (Signed)
 Transition of Care week 3  Visit Note  08/05/2024  Name: James [name] MRN: [id number]          DOB: [birth year]  Situation: Patient enrolled in [facility] 30-day program. Visit completed with patient by telephone.   Background:   Initial Transition Care Management Follow-up Telephone Call Discharge Date and Diagnosis: 07/17/24, Acute Congestive Heart Failure   Past Medical History:  Diagnosis Date   CKD (chronic kidney disease) stage 2, GFR 60-89 ml/min    COPD (chronic obstructive pulmonary disease) (HCC)    Diabetes mellitus type II, non insulin  dependent (HCC)    Dilated aortic root    Elevated PSA    Hypercholesteremia    Hypertension    NICM (nonischemic cardiomyopathy) (HCC)    NSVT (nonsustained ventricular tachycardia) (HCC)    Obstructive sleep apnea    Paroxysmal atrial fibrillation (HCC)    Pulmonary hypertension (HCC)    PVC's (premature ventricular contractions)    Tobacco abuse     Assessment: Patient Reported Symptoms: Cognitive Cognitive Status: Able to follow simple commands, Alert and oriented to person, place, and time, Normal speech and language skills     Neurological Neurological Review of Symptoms: No symptoms reported    HEENT HEENT Symptoms Reported: No symptoms reported      Cardiovascular Cardiovascular Symptoms Reported: No symptoms reported Does patient have uncontrolled Hypertension?: Yes Is patient checking Blood Pressure at home?: Yes Patient's Recent BP reading at home: 126/60 Cardiovascular Management Strategies: Medical device, Medication therapy Weight: 240 lb (108.9 kg) Cardiovascular Self-Management Outcome: 4 (good) Cardiovascular Comment: Remains on Milrinone RN checks weekly site  Respiratory Respiratory Symptoms Reported: No symptoms reported Respiratory Self-Management Outcome: 4 (good)  Endocrine Endocrine Symptoms Reported: Shakiness Is patient diabetic?: Yes Is patient checking blood sugars at home?: Yes List most  recent blood sugar readings, include date and time of day: 200 after breakfast Endocrine Self-Management Outcome: 3 (uncertain)  Gastrointestinal Gastrointestinal Symptoms Reported: No symptoms reported      Genitourinary Genitourinary Symptoms Reported: No symptoms reported    Integumentary Integumentary Symptoms Reported: No symptoms reported Additional Integumentary Details: PICC line intact - weekly checks Skin Management Strategies: Medication therapy, Medical device Skin Self-Management Outcome: 4 (good)  Musculoskeletal Musculoskelatal Symptoms Reviewed: No symptoms reported Musculoskeletal Management Strategies: Activity, Diet modification Musculoskeletal Self-Management Outcome: 4 (good)      Psychosocial Psychosocial Symptoms Reported: Anxiety - if selected complete GAD, Depression - if selected complete PHQ 2-9         Vitals:   08/05/24 1304  BP: 126/60    Medications Reviewed Today     Reviewed by Eilleen Richerd GRADE, RN (Registered Nurse) on 08/05/24 at 1741  Med List Status: <None>   Medication Order Taking? Sig Documenting Provider Last Dose Status Informant  acetaminophen  (TYLENOL ) 500 MG tablet [id number]  Take 1,000 mg by mouth every 6 (six) hours as needed for moderate pain. [provider]  Active Self, Pharmacy Records  albuterol  (VENTOLIN  HFA) 108 [phone] Base) MCG/ACT inhaler [id number]  INHALE 2 PUFFS BY MOUTH FOUR TIMES DAILY AS NEEDED Dixon, Phillip E, MD  Active Self, Pharmacy Records           Med Note Odum, RICHERD GRADE [place] Jul 29, 2024 10:11 AM) Only as needed  amiodarone (PACERONE) 200 MG tablet [id number]  Take 1 tablet (200 mg total) by mouth daily. Colletta Manuelita Garre, PA-C  Active   apixaban  (ELIQUIS ) 5 MG TABS tablet [id number]  Take 1 tablet (5 mg  total) by mouth 2 (two) times daily. Waddell Danelle ORN, MD  Active Self, Pharmacy Records  atorvastatin  (LIPITOR) 40 MG tablet [id number]  Take 1 tablet (40 mg total) by mouth daily. Melvenia Manus BRAVO, MD  Active Self, Pharmacy Records  digoxin (LANOXIN) 0.125 MG tablet [id number]  Take 1 tablet (0.125 mg total) by mouth daily. Marcine Catalan M, PA-C  Active   FARXIGA  10 MG TABS tablet [id number]  TAKE 1 TABLET(10 MG) BY MOUTH DAILY BEFORE BREAKFAST Clegg, Amy D, NP  Active Self, Pharmacy Records  hydrALAZINE  (APRESOLINE ) 50 MG tablet [id number]  Take 1 tablet (50 mg total) by mouth every 8 (eight) hours. Marcine Catalan M, PA-C  Active   metFORMIN (GLUCOPHAGE) 500 MG tablet [id number]  TAKE 1 TABLET BY MOUTH TWICE DAILY Bevely Doffing, FNP  Active Self, Pharmacy Records  mexiletine (MEXITIL) 200 MG capsule [id number]  Take 1 capsule (200 mg total) by mouth every 8 (eight) hours. Marcine Catalan M, PA-C  Active   milrinone (PRIMACOR) 20 MG/100 ML SOLN infusion [id number]  Inject 0.0294 mg/min into the vein continuous. Marcine Catalan M, PA-C  Active   potassium chloride  SA (KLOR-CON  M) 20 MEQ tablet [id number]  Take 1 tablet (20 mEq total) by mouth daily. Marcine Catalan M, PA-C  Active   sacubitril -valsartan  (ENTRESTO ) 24-26 MG [id number]  Take 1 tablet by mouth 2 (two) times daily. Colletta Manuelita Garre, PA-C  Active            Med Note LESLY, RICHERD CINDERELLA [place] Jul 29, 2024 10:27 AM) Currently on hold  sodium chloride  flush (NS) 0.9 % injection 3 mL [id number]   Sabharwal, Aditya, DO  Active   spironolactone  (ALDACTONE ) 25 MG tablet [id number]  Take 1 tablet (25 mg total) by mouth at bedtime. Glena Harlene HERO, FNP  Active Self, Pharmacy Records  torsemide  (DEMADEX ) 20 MG tablet [id number]  Take 1 tablet (20 mg total) by mouth daily. Marcine Catalan HERO, PA-C  Active             Recommendation:   Continue Current Plan of Care Continue to check blood sugars before breakfast  Follow Up Plan:   Telephone follow-up in 1 week  RICHERD Fish, RN, Scientist, research (physical sciences), CCM [facility], [facility] RN Care Manager Direct Dial: [phone]

## 2024-08-05 NOTE — Telephone Encounter (Signed)
-----   Message from Nurse [facility] sent at 08/05/2024  4:39 PM EDT ----- Possible duplicate

## 2024-08-05 NOTE — Telephone Encounter (Signed)
 Attempted to call pt to let him know that that [facility] is trying to schedule a delivery for his Rx

## 2024-08-06 DIAGNOSIS — I5020 Unspecified systolic (congestive) heart failure: Secondary | ICD-10-CM | POA: Diagnosis not present

## 2024-08-07 DIAGNOSIS — I5020 Unspecified systolic (congestive) heart failure: Secondary | ICD-10-CM | POA: Diagnosis not present

## 2024-08-08 DIAGNOSIS — I5020 Unspecified systolic (congestive) heart failure: Secondary | ICD-10-CM | POA: Diagnosis not present

## 2024-08-09 DIAGNOSIS — I5020 Unspecified systolic (congestive) heart failure: Secondary | ICD-10-CM | POA: Diagnosis not present

## 2024-08-10 DIAGNOSIS — I5020 Unspecified systolic (congestive) heart failure: Secondary | ICD-10-CM | POA: Diagnosis not present

## 2024-08-10 NOTE — Progress Notes (Incomplete)
 ADVANCED HEART FAILURE CLINIC NOTE  Primary Care: Bevely Doffing, FNP Primary Cardiologist: Maude Emmer, MD HF Cardiologist: Ria Commander, MD  HPI: James [name] is a 63 y.o. male with HFrEF/nonischemic cardiomyopathy s/p ICD, HTN, T2DM, VT, aortic root aneurysm, prostate cancer s/p EBRT and ADT, meningioma. HF dates back to 2011 when he was diagnosed with nonischemic cardiomyopathy.    He came to the [facility] system in 2013 when he had a left heart cath with no coronary disease with a EF of 20%, he was started on low-dose GDMT at that time.  By 2013 his EF improved to 45%.  He followed yearly with Dr. Nishan until 2022 when he once again had a reduction in LVEF to 20 to 25%. Repeat heart cath demonstrated no significant coronary disease.    Since establishing care with us , James [name] has undergone evaluation for advanced therapies.  His CPX is suggestive of severe heart failure limitation and multiple right heart catheterizations demonstrate persistently reduced cardiac index less than 2 L/min/m.    Completed radiation for prostate cancer April 2025. He was also referred to Neurology for mass noted on CT of head.   Echo 7/25 showed EF  25-30%, G3DD, RV mildly reduced, moderate MR  Admitted 07/09/24 with cardiogenic shock. Previous admission within the last year for similar decompensation and was worked of for transplant/LVAD in 2024. Presented with lactic acidosis, AKI, and shock liver. Echo showed EF 10-15% with mod reduced RV. He was started on Milrinone and aggressively diuresed with improvement of AKI and liver function. Failed attempt to wean from milrinone. Course further complicated by high PVC and NSVT burden, which has been decreased with mexiletine and amiodarone.   Given that this is his second [facility] admission in the last year and now milrinone dependent, repeated advanced therapies workup. He will go home with home milrinone with follow up at [facility] for transplant  evaluation. If he is not a candidate for transplant, plan is to return to [facility] for LVAD implant.   Today he returns for AHF follow up. Overall feeling ***. Denies palpitations, CP, dizziness, edema, or PND/Orthopnea. *** SOB. Appetite ok. No fever or chills. Weight at home *** pounds. Taking all medications. Denies ETOH, tobacco or drug use.   Past Medical History:  Diagnosis Date   CKD (chronic kidney disease) stage 2, GFR 60-89 ml/min    COPD (chronic obstructive pulmonary disease) (HCC)    Diabetes mellitus type II, non insulin  dependent (HCC)    Dilated aortic root    Elevated PSA    Hypercholesteremia    Hypertension    NICM (nonischemic cardiomyopathy) (HCC)    NSVT (nonsustained ventricular tachycardia) (HCC)    Obstructive sleep apnea    Paroxysmal atrial fibrillation (HCC)    Pulmonary hypertension (HCC)    PVC's (premature ventricular contractions)    Tobacco abuse    Current Outpatient Medications  Medication Sig Dispense Refill   acetaminophen  (TYLENOL ) 500 MG tablet Take 1,000 mg by mouth every 6 (six) hours as needed for moderate pain.     albuterol  (VENTOLIN  HFA) 108 (90 Base) MCG/ACT inhaler INHALE 2 PUFFS BY MOUTH FOUR TIMES DAILY AS NEEDED 6.7 g 1   amiodarone (PACERONE) 200 MG tablet Take 1 tablet (200 mg total) by mouth daily. 60 tablet 5   apixaban  (ELIQUIS ) 5 MG TABS tablet Take 1 tablet (5 mg total) by mouth 2 (two) times daily. 60 tablet 11   atorvastatin  (LIPITOR) 40 MG tablet Take 1 tablet (  40 mg total) by mouth daily. 90 tablet 3   digoxin (LANOXIN) 0.125 MG tablet Take 1 tablet (0.125 mg total) by mouth daily. 30 tablet 5   FARXIGA  10 MG TABS tablet TAKE 1 TABLET(10 MG) BY MOUTH DAILY BEFORE BREAKFAST 90 tablet 3   hydrALAZINE  (APRESOLINE ) 50 MG tablet Take 1 tablet (50 mg total) by mouth every 8 (eight) hours. 90 tablet 5   metFORMIN (GLUCOPHAGE) 500 MG tablet TAKE 1 TABLET BY MOUTH TWICE DAILY 180 tablet 1   mexiletine (MEXITIL) 200 MG capsule Take 1  capsule (200 mg total) by mouth every 8 (eight) hours. 90 capsule 5   milrinone (PRIMACOR) 20 MG/100 ML SOLN infusion Inject 0.0294 mg/min into the vein continuous.     potassium chloride  SA (KLOR-CON  M) 20 MEQ tablet Take 1 tablet (20 mEq total) by mouth daily. 30 tablet 3   sacubitril -valsartan  (ENTRESTO ) 24-26 MG Take 1 tablet by mouth 2 (two) times daily. 90 tablet 2   spironolactone  (ALDACTONE ) 25 MG tablet Take 1 tablet (25 mg total) by mouth at bedtime. 90 tablet 3   torsemide  (DEMADEX ) 20 MG tablet Take 1 tablet (20 mg total) by mouth daily. 30 tablet 3   No current facility-administered medications for this visit.   Facility-Administered Medications Ordered in Other Visits  Medication Dose Route Frequency Provider Last Rate Last Admin   sodium chloride  flush (NS) 0.9 % injection 3 mL  3 mL Intravenous Q12H Sabharwal, Aditya, DO        There were no vitals taken for this visit.  Wt Readings from Last 3 Encounters:  08/05/24 108.9 kg (240 lb)  07/30/24 112.9 kg (249 lb)  08/05/24 108.9 kg (240 lb)    PHYSICAL EXAM: General:  *** appearing.  No respiratory difficulty Neck: JVD *** cm.  Cor: Regular rate & rhythm. No murmurs. Lungs: clear Extremities: no edema  Neuro: alert & oriented x 3. Affect pleasant.   ECG SR 83 bpm, 2 PVCs *** (Personally reviewed)     ASSESSMENT & PLAN:  1. Chronic systolic CHF: Shock resolved. Long history of nonischemic cardiomyopathy. Has Biotronik ICD.  History of low output on RHC and poor CPX but with minimal symptoms in the past.  Considering advanced therapies, has preferred transplant.  No smoking since 09/15/23.  Has controlled prostate cancer.  He was admitted to [facility] with volume overload and signs of low output HF AKI on CKD stage 3 and elevated lactate. Started on Milrinone and Lasix  gtt. Diuresed. Failed milrinone wean. Remains inotrope dependent. Remains on Milrinone at 0.25 mcg/kg/min. HH coming out to house today to perform dressing  change. - NYHA III, improved on milrinone. Volume okay on exam. Continue Torsemide  20 mg daily - Check CO-OX given complaints of nausea and decreased appetite but suspect this may be d/t po amiodarone. Will also check CBC, CMET, magnesium  level and BNP. *** - Continue digoxin 0.125 mcg/kg/min - Continue dapagliflozin  10 mg daily - Continue spiro 25 mg daily - Continue hydral 50 mg tid - Continue entresto  24/26 mg BID (stopped during recent hospitalization d/t AKI) - Beta blocker on hold with low output/CGS. *** - Will need ongoing workup for advanced therapies. Dr. Bensimhon has discussed with [facility]. He will see [facility] for transplant evaluation. VAD coordinators spoke with transplant coordinator at [facility] 07/23/24. Insurance just approved transplant evaluation. He will be contacted today to arrange an appointment. In the meantime, will continue inotropic support with milrinone. If he is not transplant candidate, we will continue  with plan for VAD. ***   2. CKD stage 3: Recent AKI w/ Scr up to 2.3, improved to 1.5 prior to discharge. Suspect cardiorenal.  -  Labs today  3. PVCs/NSVT: Frequent on milrinone.  - continue mexilitine 200 mg tid - On amio 200 mg daily, dose decreased d/t nausea and decreased appetite. - Baseline TSH okay, FreeT4 mildly elevated. AST/ALT stable 07/23/24. - Has ICD - no ability to interrogate Biotronik device in our clinic. Followed by device clinic   4. Atrial fibrillation: Paroxysmal. SR on ECG today *** - Continue Eliquis . Does not need aspirin .    5. Prostate cancer: completed EBRT 4/25. Completed ST ADT.    6. Meningioma. Noted on CT 5/25, seen neurosx. CT scan during recent admission w/ stable size.    Follow up ***  Beckey LITTIE Coe AGACNP-BC  4:14 PM 08/10/24

## 2024-08-11 ENCOUNTER — Telehealth (HOSPITAL_COMMUNITY): Payer: Self-pay

## 2024-08-11 DIAGNOSIS — I5020 Unspecified systolic (congestive) heart failure: Secondary | ICD-10-CM | POA: Diagnosis not present

## 2024-08-11 NOTE — Telephone Encounter (Signed)
 Called to confirm/remind patient of their appointment at the [facility] Clinic on 08/11/2024.   Appointment:   [] Confirmed  [] Left mess   [x] No answer/No voice mail  [] VM Full/unable to leave message  [] Phone not in service  Patient reminded to bring all medications and/or complete list.  Confirmed patient has transportation. Gave directions, instructed to utilize valet parking.

## 2024-08-12 ENCOUNTER — Encounter (HOSPITAL_COMMUNITY): Payer: Self-pay | Admitting: Cardiology

## 2024-08-12 ENCOUNTER — Ambulatory Visit (HOSPITAL_COMMUNITY)
Admission: RE | Admit: 2024-08-12 | Discharge: 2024-08-12 | Disposition: A | Discharge status: Home / Self Care | Source: Ambulatory Visit | Attending: Internal Medicine | Admitting: Internal Medicine

## 2024-08-12 VITALS — BP 132/88 | HR 84 | Ht 78.0 in | Wt 243.4 lb

## 2024-08-12 DIAGNOSIS — Z7901 Long term (current) use of anticoagulants: Secondary | ICD-10-CM | POA: Diagnosis not present

## 2024-08-12 DIAGNOSIS — I5022 Chronic systolic (congestive) heart failure: Secondary | ICD-10-CM | POA: Insufficient documentation

## 2024-08-12 DIAGNOSIS — I472 Ventricular tachycardia, unspecified: Secondary | ICD-10-CM | POA: Diagnosis not present

## 2024-08-12 DIAGNOSIS — C61 Malignant neoplasm of prostate: Secondary | ICD-10-CM | POA: Insufficient documentation

## 2024-08-12 DIAGNOSIS — I493 Ventricular premature depolarization: Secondary | ICD-10-CM

## 2024-08-12 DIAGNOSIS — I4729 Other ventricular tachycardia: Secondary | ICD-10-CM

## 2024-08-12 DIAGNOSIS — I428 Other cardiomyopathies: Secondary | ICD-10-CM | POA: Diagnosis not present

## 2024-08-12 DIAGNOSIS — E1122 Type 2 diabetes mellitus with diabetic chronic kidney disease: Secondary | ICD-10-CM | POA: Insufficient documentation

## 2024-08-12 DIAGNOSIS — I480 Paroxysmal atrial fibrillation: Secondary | ICD-10-CM | POA: Diagnosis not present

## 2024-08-12 DIAGNOSIS — Z79899 Other long term (current) drug therapy: Secondary | ICD-10-CM | POA: Diagnosis not present

## 2024-08-12 DIAGNOSIS — I130 Hypertensive heart and chronic kidney disease with heart failure and stage 1 through stage 4 chronic kidney disease, or unspecified chronic kidney disease: Secondary | ICD-10-CM | POA: Insufficient documentation

## 2024-08-12 DIAGNOSIS — N1831 Chronic kidney disease, stage 3a: Secondary | ICD-10-CM | POA: Diagnosis not present

## 2024-08-12 DIAGNOSIS — Z86011 Personal history of benign neoplasm of the brain: Secondary | ICD-10-CM | POA: Diagnosis not present

## 2024-08-12 DIAGNOSIS — D329 Benign neoplasm of meninges, unspecified: Secondary | ICD-10-CM | POA: Diagnosis not present

## 2024-08-12 DIAGNOSIS — I5020 Unspecified systolic (congestive) heart failure: Secondary | ICD-10-CM | POA: Diagnosis not present

## 2024-08-12 DIAGNOSIS — Z95810 Presence of automatic (implantable) cardiac defibrillator: Secondary | ICD-10-CM | POA: Diagnosis not present

## 2024-08-12 MED ORDER — DIGOXIN 125 MCG PO TABS: 0.1250 mg | ORAL_TABLET | Freq: Every day | ORAL | 5 refills | Status: DC

## 2024-08-12 MED ORDER — HYDRALAZINE HCL 50 MG PO TABS: 50.0000 mg | ORAL_TABLET | Freq: Three times a day (TID) | ORAL | 5 refills | Status: DC

## 2024-08-12 MED ORDER — AMIODARONE HCL 200 MG PO TABS: 200.0000 mg | ORAL_TABLET | Freq: Every day | ORAL | 5 refills | Status: DC

## 2024-08-12 MED ORDER — TORSEMIDE 20 MG PO TABS: 20.0000 mg | ORAL_TABLET | Freq: Every day | ORAL | 3 refills | Status: DC

## 2024-08-12 MED ORDER — POTASSIUM CHLORIDE CRYS ER 20 MEQ PO TBCR: 20.0000 meq | EXTENDED_RELEASE_TABLET | Freq: Every day | ORAL | 3 refills | Status: DC

## 2024-08-12 MED ORDER — MEXILETINE HCL 200 MG PO CAPS: 200.0000 mg | ORAL_CAPSULE | Freq: Three times a day (TID) | ORAL | 5 refills | Status: DC

## 2024-08-12 NOTE — Patient Instructions (Signed)
 Medication Changes:  MEDICATIONS REFILLED   Follow-Up in: 4 WEEKS AS SCHEDULED WITH DR. ZENAIDA   At the [facility], you and your health needs are our priority. We have a designated team specialized in the treatment of Heart Failure. This Care Team includes your primary Heart Failure Specialized Cardiologist (physician), Advanced Practice Providers (APPs- Physician Assistants and Nurse Practitioners), and Pharmacist who all work together to provide you with the care you need, when you need it.   You may see any of the following providers on your designated Care Team at your next follow up:  Dr. Toribio Fuel Dr. Ezra Shuck Dr. Odis Zenaida Greig Mosses, NP Caffie Shed, [place] [facility] [place], [place] Beckey Coe, NP [name], NP Tinnie Redman, PharmD   Please be sure to bring in all your medications bottles to every appointment.   Need to Contact Us :  If you have any questions or concerns before your next appointment please send us  a message through [place] or call our office at [phone].    TO LEAVE A MESSAGE FOR THE NURSE SELECT OPTION 2, PLEASE LEAVE A MESSAGE INCLUDING: YOUR NAME DATE OF BIRTH CALL BACK NUMBER REASON FOR CALL**this is important as we prioritize the call backs  YOU WILL RECEIVE A CALL BACK THE SAME DAY AS LONG AS YOU CALL BEFORE 4:00 PM

## 2024-08-13 ENCOUNTER — Telehealth (HOSPITAL_COMMUNITY): Payer: Self-pay

## 2024-08-13 ENCOUNTER — Telehealth: Payer: Self-pay

## 2024-08-13 ENCOUNTER — Other Ambulatory Visit: Payer: Self-pay

## 2024-08-13 DIAGNOSIS — I5020 Unspecified systolic (congestive) heart failure: Secondary | ICD-10-CM | POA: Diagnosis not present

## 2024-08-13 NOTE — Transitions of Care (Post Inpatient/ED Visit) (Signed)
 Transition of Care week 4  Visit Note  08/13/2024  Name: James [name] MRN: [id number]          DOB: 01-01-1961  Situation: Patient enrolled in [facility] 30-day program. Visit completed with patient by telephone.   Background:   Initial Transition Care Management Follow-up Telephone Call Discharge Date and Diagnosis: 07/17/24, Acute Congestive Heart Failure   Past Medical History:  Diagnosis Date   CKD (chronic kidney disease) stage 2, GFR 60-89 ml/min    COPD (chronic obstructive pulmonary disease) (HCC)    Diabetes mellitus type II, non insulin  dependent (HCC)    Dilated aortic root    Elevated PSA    Hypercholesteremia    Hypertension    NICM (nonischemic cardiomyopathy) (HCC)    NSVT (nonsustained ventricular tachycardia) (HCC)    Obstructive sleep apnea    Paroxysmal atrial fibrillation (HCC)    Pulmonary hypertension (HCC)    PVC's (premature ventricular contractions)    Tobacco abuse     Assessment: Patient Reported Symptoms: Cognitive Cognitive Status: Able to follow simple commands, Alert and oriented to person, place, and time, Normal speech and language skills Cognitive/Intellectual Conditions Management [RPT]: None reported or documented in medical history or problem list      Neurological Neurological Review of Symptoms: No symptoms reported    HEENT HEENT Symptoms Reported: No symptoms reported      Cardiovascular Cardiovascular Symptoms Reported: No symptoms reported (Has a follow up arranged 09/28/24 at [facility] for eval. If not candidate for transplant  then VAD, Beta blocker on hold low output was noted per medical record and patient confirms his appointment and plan.) Does patient have uncontrolled Hypertension?: Yes Is patient checking Blood Pressure at home?: Yes Patient's Recent BP reading at home: 132/88 Cardiovascular Management Strategies: Medical device, Medication therapy, Routine screening Weight: 242 lb (109.8 kg) Cardiovascular  Self-Management Outcome: 4 (good) Cardiovascular Comment: Remains on Milrinone RN checks weekly site  Respiratory Respiratory Symptoms Reported: No symptoms reported Other Respiratory Symptoms: denies SOB    Endocrine Endocrine Symptoms Reported: No symptoms reported Is patient diabetic?: Yes Is patient checking blood sugars at home?: Yes List most recent blood sugar readings, include date and time of day: 126 Endocrine Self-Management Outcome: 4 (good)  Gastrointestinal Gastrointestinal Symptoms Reported: Nausea, Vomiting Additional Gastrointestinal Details: I am having periods where my food won't stay down and it just don't taste right, I talked it over with the cardiology yesterday and said it may be the Milrinone Gastrointestinal Self-Management Outcome: 3 (uncertain) Gastrointestinal Comment: Spoke with patient about what types of food that maybe causing and his activity when these symptoms occur, will continue to follow and review    Genitourinary Genitourinary Symptoms Reported: No symptoms reported    Integumentary Integumentary Symptoms Reported: No symptoms reported Additional Integumentary Details: PICC line intact - weekly checks with [facility] RN Skin Management Strategies: Medical device, Medication therapy, Routine screening Skin Self-Management Outcome: 4 (good)  Musculoskeletal Musculoskelatal Symptoms Reviewed: No symptoms reported Musculoskeletal Management Strategies: Adequate rest, Activity, Medication therapy, Routine screening Musculoskeletal Self-Management Outcome: 4 (good)      Psychosocial Psychosocial Symptoms Reported: Depression - if selected complete PHQ 2-9         Vitals:   08/13/24 1315  BP: 132/88    Medications Reviewed Today     Reviewed by Eilleen Richerd GRADE, RN (Registered Nurse) on 08/13/24 at 1326  Med List Status: <None>   Medication Order Taking? Sig Documenting Provider Last Dose Status Informant  acetaminophen  (TYLENOL )  500 MG tablet  [id number] Yes Take 1,000 mg by mouth every 6 (six) hours as needed for moderate pain. [provider]  Active Self, Pharmacy Records  albuterol  (VENTOLIN  HFA) [phone] Base) MCG/ACT inhaler [id number] Yes INHALE 2 PUFFS BY MOUTH FOUR TIMES DAILY AS NEEDED  Patient taking differently: every 4 (four) hours as needed.   Melvenia Manus BRAVO, MD  Active Self, Pharmacy Records           Med Note [place], RICHERD CINDERELLA Schaumann Aug 13, 2024  1:15 PM) Only as needed  amiodarone (PACERONE) 200 MG tablet [id number]  Take 1 tablet (200 mg total) by mouth daily. Hayes Beckey CROME, NP  Active            Med Note LESLY, RICHERD CINDERELLA Schaumann Aug 13, 2024  1:17 PM) Waiting on [facility] to call when it's ready  apixaban  (ELIQUIS ) 5 MG TABS tablet [id number] Yes Take 1 tablet (5 mg total) by mouth 2 (two) times daily. Waddell Danelle ORN, MD  Active Self, Pharmacy Records  atorvastatin  (LIPITOR) 40 MG tablet [id number] Yes Take 1 tablet (40 mg total) by mouth daily. Melvenia Manus BRAVO, MD  Active Self, Pharmacy Records  digoxin (LANOXIN) 0.125 MG tablet [id number]  Take 1 tablet (0.125 mg total) by mouth daily. Hayes Beckey CROME, NP  Active            Med Note LESLY, RICHERD CINDERELLA Schaumann Aug 13, 2024  1:18 PM) Waiting on [facility] to text if ready  FARXIGA  10 MG TABS tablet [id number] Yes TAKE 1 TABLET(10 MG) BY MOUTH DAILY BEFORE BREAKFAST Clegg, Amy D, NP  Active Self, Pharmacy Records  hydrALAZINE  (APRESOLINE ) 50 MG tablet [id number] Yes Take 1 tablet (50 mg total) by mouth every 8 (eight) hours. Hayes Beckey CROME, NP  Active   metFORMIN (GLUCOPHAGE) 500 MG tablet [id number] Yes TAKE 1 TABLET BY MOUTH TWICE DAILY Bevely Doffing, FNP  Active Self, Pharmacy Records  mexiletine (MEXITIL) 200 MG capsule [id number] Yes Take 1 capsule (200 mg total) by mouth every 8 (eight) hours. Hayes Beckey CROME, NP  Active   milrinone [facility]) 20 MG/100 ML SOLN infusion [id number] Yes Inject 0.0294 mg/min into the vein continuous. Marcine Catalan M, PA-C  Active    potassium chloride  SA (KLOR-CON  M) 20 MEQ tablet [id number]  Take 1 tablet (20 mEq total) by mouth daily. Hayes Beckey CROME, NP  Active            Med Note LESLY, RICHERD CINDERELLA Schaumann Aug 13, 2024  1:25 PM) Awaiting on [facility]  sacubitril -valsartan  (ENTRESTO ) 24-26 MG [id number]  Take 1 tablet by mouth 2 (two) times daily.  Patient not taking: Reported on 08/12/2024   Colletta Manuelita Garre, PA-C  Active            Med Note LESLY, RICHERD CINDERELLA [place] Jul 29, 2024 10:27 AM) Currently on hold  sodium chloride  flush (NS) 0.9 % injection 3 mL [id number]   Sabharwal, Aditya, DO  Active   spironolactone  (ALDACTONE ) 25 MG tablet [id number] Yes Take 1 tablet (25 mg total) by mouth at bedtime. Glena Harlene HERO, FNP  Active Self, Pharmacy Records  torsemide  (DEMADEX ) 20 MG tablet [id number] Yes Take 1 tablet (20 mg total) by mouth daily. Hayes Beckey CROME, NP  Active            Care gaps were reviewed with patient today and patient verbalized understanding to follow up with primary  care provider during office visits and discuss any concerns.  Recommendation:   Continue Current Plan of Care  Follow Up Plan:   Telephone follow-up in 1 week  Richerd Fish, RN, BSN, CCM [facility], [facility] Management Coordinator Direct Dial: [phone]

## 2024-08-13 NOTE — Patient Instructions (Addendum)
 Visit Information  Thank you for taking time to visit with me today. Please don't hesitate to contact me if I can be of assistance to you before our next scheduled telephone appointment.  Our next appointment is by telephone on 08/21/24 at 1400  Following is a copy of your care plan:   Goals Addressed             This Visit's Progress    VBCI Transitions of Care (TOC) Care Plan   On track    Problems: (Reviewed with patient 07/3024) Recent Hospitalization for treatment of CHF -  Patient is now milrinone dependent, he has re-complete advanced therapies workup.  Home Health services barrier: [facility] - patient's PICC and IV is beeping states no one has come out 07/29/24 BP cuff not working correctly, patient to take to PCP/Cardiology appointment  07/30/24 to check calibration. 08/05/24 BP monitoring without problems, HH checking weekly Appetite not like he would desire  08/13/24 Ongoing upset stomach a few days, MD was made aware maybe Milrinone side effects discussed that he will follow up with [facility] transplant on 09/28/24 for an evaluation. If not a candidate then VAD is being considered. Goal:  Over the next 30 days, the patient will not experience hospital readmission  Interventions:  Transitions of Care: Communication with [facility] re: Home IV Milrinone 07/29/24 Reviewed weight, diet and blood glucose monitoring importance and ongoing needs. Patient states Entresto  is on pause until seeing Cardiology appointment 07/30/24 08/12/24 Entresto  reviewed and to receive from pharmacy - [facility]  Heart Failure Interventions: Basic overview and discussion of pathophysiology of Heart Failure reviewed Advised patient to weigh each morning after emptying bladder Discussed importance of daily weight and advised patient to weigh and record daily Reviewed role of diuretics in prevention of fluid overload and management of heart failure; Discussed the importance of keeping all  appointments with provider Screening for signs and symptoms of depression related to chronic disease state  Assessed social determinant of health barriers   Patient Self Care Activities:  Attend all scheduled provider appointments Call pharmacy for medication refills 3-7 days in advance of running out of medications Call provider office for new concerns or questions  Notify RN Care Manager of TOC call rescheduling needs Participate in Transition of Care Program/Attend TOC scheduled calls Take medications as prescribed    Plan:  The care management team will reach out to the patient again over the next 5- 7 business days. The patient has been provided with contact information for the care management team and has been advised to call with any health related questions or concerns.  The patient will call [facility] as advised to reach out if he has not heard back at 1236 pm this clinical research associate contacted Engineer, Drilling and spoke with Icara in pharmacy and transferred to Deb in nursing who states patient is on schedule with [facility] to see today they are to reach out to patient.  07/21/24 Discussed and offered 30 day TOC program.  Patient accepted [facility] program for weekly follow up calls. The patient has been provided with contact information for the care management team and has been advised to call with any health -related questions or concerns.  The patient verbalized understanding with current plan of care.  The patient is directed to their insurance card regarding availability of benefits coverage.   08/13/24 Continue weighing, monitoring blood sugars, medications, dietary, and HF management.         Patient verbalizes understanding of instructions and care  plan provided today and agrees to view in MyChart. Active MyChart status and patient understanding of how to access instructions and care plan via MyChart confirmed with patient.     The patient has been provided with contact information for the care management  team and has been advised to call with any health related questions or concerns.  Follow up with provider re: any worsening symptoms with vomiting, to avoid dehydration. Encouraged to check blood sugar and BP regularly.  Please call the care guide team at [phone] if you need to cancel or reschedule your appointment.   Please call the [place]  National Suicide Prevention Lifeline: 936 136 1363 or TTY: 905 673 5173 TTY 702-260-7023) to talk to a trained counselor if you are experiencing a Mental Health or Behavioral Health Crisis or need someone to talk to.  Richerd Fish, RN, BSN, CCM [facility], [facility] Management Coordinator Direct Dial: [phone]

## 2024-08-13 NOTE — Telephone Encounter (Signed)
 Received a fax requesting medical records from [facility] [employer]. Records, Atten: [place], B Claims Examiner 2, Std 1, were successfully faxed to: [phone] ,which was the number provided.. Medical request form will be scanned into patients chart.   Phone number: [phone]

## 2024-08-14 DIAGNOSIS — I5020 Unspecified systolic (congestive) heart failure: Secondary | ICD-10-CM | POA: Diagnosis not present

## 2024-08-15 DIAGNOSIS — I5020 Unspecified systolic (congestive) heart failure: Secondary | ICD-10-CM | POA: Diagnosis not present

## 2024-08-16 DIAGNOSIS — I5020 Unspecified systolic (congestive) heart failure: Secondary | ICD-10-CM | POA: Diagnosis not present

## 2024-08-17 ENCOUNTER — Ambulatory Visit: Payer: BC Managed Care – PPO

## 2024-08-17 ENCOUNTER — Other Ambulatory Visit: Payer: Self-pay

## 2024-08-17 DIAGNOSIS — I4729 Other ventricular tachycardia: Secondary | ICD-10-CM | POA: Diagnosis not present

## 2024-08-17 DIAGNOSIS — I5020 Unspecified systolic (congestive) heart failure: Secondary | ICD-10-CM | POA: Diagnosis not present

## 2024-08-17 LAB — CUP PACEART REMOTE DEVICE CHECK
Date Time Interrogation Session: 20251103111752
Implantable Lead Connection Status: 753985
Implantable Lead Implant Date: 20221107
Implantable Lead Location: 753860
Implantable Lead Model: 436909
Implantable Lead Serial Number: 81476621
Implantable Pulse Generator Implant Date: 20221107
Pulse Gen Model: 429525
Pulse Gen Serial Number: 84864435

## 2024-08-18 DIAGNOSIS — I5020 Unspecified systolic (congestive) heart failure: Secondary | ICD-10-CM | POA: Diagnosis not present

## 2024-08-19 DIAGNOSIS — I5020 Unspecified systolic (congestive) heart failure: Secondary | ICD-10-CM | POA: Diagnosis not present

## 2024-08-19 NOTE — Progress Notes (Signed)
 Remote ICD Transmission

## 2024-08-20 ENCOUNTER — Ambulatory Visit: Payer: Self-pay | Admitting: Internal Medicine

## 2024-08-20 DIAGNOSIS — I5020 Unspecified systolic (congestive) heart failure: Secondary | ICD-10-CM | POA: Diagnosis not present

## 2024-08-21 ENCOUNTER — Other Ambulatory Visit: Payer: Self-pay

## 2024-08-21 ENCOUNTER — Telehealth: Payer: Self-pay

## 2024-08-21 DIAGNOSIS — I5020 Unspecified systolic (congestive) heart failure: Secondary | ICD-10-CM | POA: Diagnosis not present

## 2024-08-21 NOTE — Patient Instructions (Signed)
 Visit Information  Thank you for taking time to visit with me today. Please don't hesitate to contact me if I can be of assistance to you before our next scheduled telephone appointment.  Our next appointment is being referred to longitudinal CCM team  Following is a copy of your care plan:   Goals Addressed             This Visit's Progress    VBCI Transitions of Care (TOC) Care Plan   On track    Problems: (Reviewed with patient 08/21/24) Recent Hospitalization for treatment of CHF -  Patient is now milrinone dependent, he has re-complete advanced therapies workup.  Home Health services barrier: [facility] - patient's PICC and IV is beeping states no one has come out 07/29/24 BP cuff not working correctly, patient to take to PCP/Cardiology appointment  07/30/24 to check calibration. 08/05/24 BP monitoring without problems, HH checking weekly Appetite not like he would desire  08/13/24 Ongoing upset stomach a few days, MD was made aware maybe Milrinone side effects discussed that he will follow up with [facility] transplant on 09/28/24 for an evaluation. If not a candidate then VAD is being considered. 08/21/24 Has appointment with [facility] Goal:  Over the next 30 days, the patient will not experience hospital readmission  Interventions:  Transitions of Care: Communication with [facility] re: Home IV Milrinone 07/29/24 Reviewed weight, diet and blood glucose monitoring importance and ongoing needs. Patient states Entresto  is on pause until seeing Cardiology appointment 07/30/24 08/12/24 Entresto  reviewed and to receive from pharmacy - [facility] 08/21/24 Reviewed medications and had received Medications from pharmacy however his Eliquis  is needed from pharmacy patient awaiting a call from [facility]. Patient states he needs to speak with wife about Eliquis  from [facility]. Patient to contact [facility] today.  Heart Failure Interventions: Basic overview and discussion of  pathophysiology of Heart Failure reviewed Advised patient to weigh each morning after emptying bladder Discussed importance of daily weight and advised patient to weigh and record daily Reviewed role of diuretics in prevention of fluid overload and management of heart failure; Discussed the importance of keeping all appointments with provider Screening for signs and symptoms of depression related to chronic disease state  Assessed social determinant of health barriers   Patient Self Care Activities:  Attend all scheduled provider appointments Call pharmacy for medication refills 3-7 days in advance of running out of medications Call provider office for new concerns or questions  Notify RN Care Manager of TOC call rescheduling needs Participate in Transition of Care Program/Attend TOC scheduled calls Take medications as prescribed    Plan:  The care management team will reach out to the patient again over the next 5- 7 business days. The patient has been provided with contact information for the care management team and has been advised to call with any health related questions or concerns.  The patient will call [facility] as advised to reach out if he has not heard back at 1236 pm this clinical research associate contacted Engineer, Drilling and spoke with Icara in pharmacy and transferred to Deb in nursing who states patient is on schedule with [facility] to see today they are to reach out to patient.  07/21/24 Discussed and offered 30 day TOC program.  Patient accepted [facility] program for weekly follow up calls. The patient has been provided with contact information for the care management team and has been advised to call with any health -related questions or concerns.  The patient verbalized understanding with current  plan of care.  The patient is directed to their insurance card regarding availability of benefits coverage.   08/13/24 Continue weighing, monitoring blood sugars, medications, dietary, and HF management.  08/21/24  Patient has completed the 30 day program and agrees to CCM longitudinal RN for follow up. Will make referral.        Patient verbalizes understanding of instructions and care plan provided today and agrees to view in MyChart. Active MyChart status and patient understanding of how to access instructions and care plan via MyChart confirmed with patient.     The patient has been provided with contact information for the care management team and has been advised to call with any health related questions or concerns.   Please call the care guide team at [phone] if you need to cancel or reschedule your appointment.   Please call the [place]  National Suicide Prevention Lifeline: (431)850-7140 or TTY: 5191705830 TTY 934-194-6024) to talk to a trained counselor if you are experiencing a Mental Health or Behavioral Health Crisis or need someone to talk to.  Richerd Fish, RN, BSN, CCM [facility], [facility] Management Coordinator Direct Dial: [phone]

## 2024-08-21 NOTE — Transitions of Care (Post Inpatient/ED Visit) (Signed)
 Transition of Care Week 5 final  Visit Note  08/21/2024  Name: James [name] MRN: [id number]          DOB: [birth year]  Situation: Patient enrolled in [facility] 30-day program. Visit completed with patient by telephone.   Background:   Initial Transition Care Management Follow-up Telephone Call Discharge Date and Diagnosis: 07/17/24, Acute Congestive Heart Failure   Past Medical History:  Diagnosis Date   CKD (chronic kidney disease) stage 2, GFR 60-89 ml/min    COPD (chronic obstructive pulmonary disease) (HCC)    Diabetes mellitus type II, non insulin  dependent (HCC)    Dilated aortic root    Elevated PSA    Hypercholesteremia    Hypertension    NICM (nonischemic cardiomyopathy) (HCC)    NSVT (nonsustained ventricular tachycardia) (HCC)    Obstructive sleep apnea    Paroxysmal atrial fibrillation (HCC)    Pulmonary hypertension (HCC)    PVC's (premature ventricular contractions)    Tobacco abuse     Assessment: Patient Reported Symptoms: Cognitive Cognitive Status: Able to follow simple commands, Alert and oriented to person, place, and time, Normal speech and language skills      Neurological Neurological Review of Symptoms: No symptoms reported Neurological Self-Management Outcome: 4 (good)  HEENT HEENT Symptoms Reported: No symptoms reported      Cardiovascular Cardiovascular Symptoms Reported: No symptoms reported Does patient have uncontrolled Hypertension?: Yes Is patient checking Blood Pressure at home?: Yes Patient's Recent BP reading at home: 146/66 Cardiovascular Management Strategies: Medical device, Medication therapy, Routine screening Weight: 242 lb (109.8 kg) Cardiovascular Self-Management Outcome: 4 (good) Cardiovascular Comment: Milrinone intact  Respiratory Respiratory Symptoms Reported: No symptoms reported    Endocrine Endocrine Symptoms Reported: No symptoms reported Is patient diabetic?: Yes Is patient checking blood sugars at home?:  Yes List most recent blood sugar readings, include date and time of day: 120 Endocrine Self-Management Outcome: 4 (good)  Gastrointestinal Gastrointestinal Symptoms Reported: No symptoms reported Additional Gastrointestinal Details: I had two BMs      Genitourinary Genitourinary Symptoms Reported: No symptoms reported    Integumentary Integumentary Symptoms Reported: No symptoms reported Additional Integumentary Details: PICC line intact - weekly checks Skin Management Strategies: Medical device, Medication therapy, Routine screening Skin Self-Management Outcome: 4 (good)  Musculoskeletal Musculoskelatal Symptoms Reviewed: No symptoms reported Musculoskeletal Management Strategies: Adequate rest, Activity, Medication therapy, Routine screening Musculoskeletal Self-Management Outcome: 4 (good)      Psychosocial Psychosocial Symptoms Reported: No symptoms reported         Vitals:   08/21/24 1413  BP: (!) 146/66    Medications Reviewed Today     Reviewed by Eilleen Richerd GRADE, RN (Registered Nurse) on 08/21/24 at 1432  Med List Status: <None>   Medication Order Taking? Sig Documenting Provider Last Dose Status Informant  acetaminophen  (TYLENOL ) 500 MG tablet [id number]  Take 1,000 mg by mouth every 6 (six) hours as needed for moderate pain. [provider]  Active Self, Pharmacy Records  albuterol  (VENTOLIN  HFA) 108 (90 Base) MCG/ACT inhaler [id number]  INHALE 2 PUFFS BY MOUTH FOUR TIMES DAILY AS NEEDED  Patient taking differently: every 4 (four) hours as needed.   Melvenia Manus BRAVO, MD  Active Self, Pharmacy Records           Med Note [place], RICHERD GRADE [place] Aug 13, 2024  1:15 PM) Only as needed  amiodarone (PACERONE) 200 MG tablet [id number] Yes Take 1 tablet (200 mg total) by mouth daily. Hayes Beckey CROME, NP  Active            Med Note [place], RICHERD CINDERELLA [place] Aug 13, 2024  1:17 PM) Waiting on [facility] to call when it's ready  apixaban  (ELIQUIS ) 5 MG TABS tablet  [id number]  Take 1 tablet (5 mg total) by mouth 2 (two) times daily.  Patient not taking: Reported on 08/21/2024   Waddell Danelle ORN, MD  Active Self, Pharmacy Records           Med Note [place], RICHERD CINDERELLA   Fri Aug 21, 2024  2:28 PM) Waiting on Eliquis  from [facility]  atorvastatin  (LIPITOR) 40 MG tablet [id number] Yes Take 1 tablet (40 mg total) by mouth daily. Melvenia Manus BRAVO, MD  Active Self, Pharmacy Records  digoxin (LANOXIN) 0.125 MG tablet [id number] Yes Take 1 tablet (0.125 mg total) by mouth daily. Hayes Beckey CROME, NP  Active            Med Note LESLY, RICHERD CINDERELLA [place] Aug 13, 2024  1:18 PM) Waiting on [facility] to text if ready  FARXIGA  10 MG TABS tablet [id number] Yes TAKE 1 TABLET(10 MG) BY MOUTH DAILY BEFORE BREAKFAST Clegg, Amy D, NP  Active Self, Pharmacy Records  hydrALAZINE  (APRESOLINE ) 50 MG tablet [id number] Yes Take 1 tablet (50 mg total) by mouth every 8 (eight) hours. Hayes Beckey CROME, NP  Active   metFORMIN (GLUCOPHAGE) 500 MG tablet [id number] Yes TAKE 1 TABLET BY MOUTH TWICE DAILY Bevely Doffing, FNP  Active Self, Pharmacy Records  mexiletine (MEXITIL) 200 MG capsule [id number] Yes Take 1 capsule (200 mg total) by mouth every 8 (eight) hours. Hayes Beckey CROME, NP  Active   milrinone [facility]) 20 MG/100 ML SOLN infusion [id number] Yes Inject 0.0294 mg/min into the vein continuous. Marcine Catalan M, PA-C  Active   potassium chloride  SA (KLOR-CON  M) 20 MEQ tablet [id number] Yes Take 1 tablet (20 mEq total) by mouth daily. Hayes Beckey CROME, NP  Active            Med Note LESLY, RICHERD CINDERELLA [place] Aug 13, 2024  1:25 PM) Awaiting on [facility]  sacubitril -valsartan  (ENTRESTO ) 24-26 MG [id number]  Take 1 tablet by mouth 2 (two) times daily.  Patient not taking: Reported on 08/12/2024   Colletta Manuelita Garre, PA-C  Active            Med Note LESLY, RICHERD CINDERELLA [place] Jul 29, 2024 10:27 AM) Currently on hold  sodium chloride  flush (NS) 0.9 % injection 3 mL [id number]   Sabharwal, Aditya, DO  Active    spironolactone  (ALDACTONE ) 25 MG tablet [id number] Yes Take 1 tablet (25 mg total) by mouth at bedtime. [place], Harlene HERO, FNP  Active Self, Pharmacy Records  torsemide  (DEMADEX ) 20 MG tablet [id number] Yes Take 1 tablet (20 mg total) by mouth daily. Hayes Beckey CROME, NP  Active            Care gaps were reviewed with patient today and patient verbalized understanding to follow up with primary care provider during office visits and discuss any concerns.  Recommendation:   Referral to: Longitudinal CCM RN  Follow Up Plan:   Referral to RN Case Manager Closing From:  Transitions of Care Program Patient has met all care management goals. Care Management case will be closed. Patient has been provided contact information should new needs arise.   Richerd Fish, RN, BSN, CCM [facility], [facility] Management Coordinator Direct Dial: [phone]

## 2024-08-22 DIAGNOSIS — I5020 Unspecified systolic (congestive) heart failure: Secondary | ICD-10-CM | POA: Diagnosis not present

## 2024-08-23 DIAGNOSIS — I5020 Unspecified systolic (congestive) heart failure: Secondary | ICD-10-CM | POA: Diagnosis not present

## 2024-08-24 ENCOUNTER — Telehealth: Payer: Self-pay

## 2024-08-24 DIAGNOSIS — E119 Type 2 diabetes mellitus without complications: Secondary | ICD-10-CM

## 2024-08-24 DIAGNOSIS — I5041 Acute combined systolic (congestive) and diastolic (congestive) heart failure: Secondary | ICD-10-CM

## 2024-08-24 DIAGNOSIS — N1831 Chronic kidney disease, stage 3a: Secondary | ICD-10-CM

## 2024-08-24 DIAGNOSIS — I5020 Unspecified systolic (congestive) heart failure: Secondary | ICD-10-CM | POA: Diagnosis not present

## 2024-08-24 DIAGNOSIS — I428 Other cardiomyopathies: Secondary | ICD-10-CM

## 2024-08-24 DIAGNOSIS — I10 Essential (primary) hypertension: Secondary | ICD-10-CM

## 2024-08-24 DIAGNOSIS — I2510 Atherosclerotic heart disease of native coronary artery without angina pectoris: Secondary | ICD-10-CM

## 2024-08-24 NOTE — Transitions of Care (Post Inpatient/ED Visit) (Signed)
 08/24/2024  Patient ID: James [name], male   DOB: [birth year], 63 y.o.   MRN: [id number]  11:32 Called patient regarding medication refill needs but unable to leave a voicemail message, however, historically, patient will call back when number is noted on his phone. 12:02 PM Patient returned call and confirms he was able to get his medications. He states [facility] in [place] stated having staffing needs and will be changing there store hours. Discussed with patient to refer him to Central Pharmacist with PCP office and he agrees due to the delicate nature of his complex disease and ongoing medication management needs.  Patient completed 30 day TOC program and will refer to Longitudinal CCM team for following.  Richerd Fish, RN, BSN, CCM [facility], [facility] Management Coordinator Direct Dial: [phone]

## 2024-08-24 NOTE — Progress Notes (Signed)
   08/24/2024 Name: James [name] MRN: [id number] DOB: Mar 13, 1961  Chief Complaint  Patient presents with   Medication Assistance    Received message and incoming referral from [facility] RN to review meds/assist with getting eliquis  on time. Currently experiencing issues with eliquis  getting [facility]. Last sold 30 DS 08/21/2024 upon review of fill hx.   Lang Sieve, PharmD, BCGP Clinical Pharmacist  [phone]

## 2024-08-25 DIAGNOSIS — I5020 Unspecified systolic (congestive) heart failure: Secondary | ICD-10-CM | POA: Diagnosis not present

## 2024-08-26 ENCOUNTER — Telehealth: Payer: Self-pay

## 2024-08-26 DIAGNOSIS — I5020 Unspecified systolic (congestive) heart failure: Secondary | ICD-10-CM | POA: Diagnosis not present

## 2024-08-26 NOTE — Progress Notes (Signed)
 Care Guide Pharmacy Note  08/26/2024 Name: James [name] MRN: [id number] DOB: 08-24-1961  Referred By: Bevely Doffing, FNP Reason for referral: Complex Care Management (Outreach to schedule with Pharm d )   James [name] is a 63 y.o. year old male who is a primary care patient of Bevely Doffing, [place].  James [name] was referred to the pharmacist for assistance related to: CHF and HTN  Successful contact was made with the patient to discuss pharmacy services including being ready for the pharmacist to call at least 5 minutes before the scheduled appointment time and to have medication bottles and any blood pressure readings ready for review. The patient agreed to meet with the pharmacist via telephone visit on (date/time).08/27/2024  James [name] , RMA     [place]  [facility], [facility] Guide  Direct Dial: [phone]  Website: [email or web address]

## 2024-08-27 ENCOUNTER — Other Ambulatory Visit

## 2024-08-27 ENCOUNTER — Telehealth: Payer: Self-pay

## 2024-08-27 DIAGNOSIS — I5020 Unspecified systolic (congestive) heart failure: Secondary | ICD-10-CM | POA: Diagnosis not present

## 2024-08-27 NOTE — Progress Notes (Signed)
   08/27/2024  Patient ID: James [name], male   DOB: 02/21/1961, 64 y.o.   MRN: [id number]  Attempted to reach patient to hopefully provided assistance with obtaining access. Noted by [facility] RN that patient was experiencing difficulty obtaining eliquis  at current pharmacy.   Should patient return call, I can be reached at the number below.   Lang Sieve, PharmD, BCGP Clinical Pharmacist  [phone]

## 2024-08-27 NOTE — Progress Notes (Unsigned)
   08/27/2024 Name: James [name] MRN: [id number] DOB: 1960/12/30  Chief Complaint  Patient presents with   Medication Assistance    ***  Lang Sieve, PharmD, BCGP Clinical Pharmacist  [phone]

## 2024-08-28 DIAGNOSIS — I5020 Unspecified systolic (congestive) heart failure: Secondary | ICD-10-CM | POA: Diagnosis not present

## 2024-08-29 DIAGNOSIS — I5020 Unspecified systolic (congestive) heart failure: Secondary | ICD-10-CM | POA: Diagnosis not present

## 2024-08-30 DIAGNOSIS — I5020 Unspecified systolic (congestive) heart failure: Secondary | ICD-10-CM | POA: Diagnosis not present

## 2024-08-31 DIAGNOSIS — I739 Peripheral vascular disease, unspecified: Secondary | ICD-10-CM | POA: Diagnosis not present

## 2024-08-31 DIAGNOSIS — M79671 Pain in right foot: Secondary | ICD-10-CM | POA: Diagnosis not present

## 2024-08-31 DIAGNOSIS — I5020 Unspecified systolic (congestive) heart failure: Secondary | ICD-10-CM | POA: Diagnosis not present

## 2024-08-31 DIAGNOSIS — E1140 Type 2 diabetes mellitus with diabetic neuropathy, unspecified: Secondary | ICD-10-CM | POA: Diagnosis not present

## 2024-08-31 DIAGNOSIS — L110 Acquired keratosis follicularis: Secondary | ICD-10-CM | POA: Diagnosis not present

## 2024-09-01 DIAGNOSIS — I5020 Unspecified systolic (congestive) heart failure: Secondary | ICD-10-CM | POA: Diagnosis not present

## 2024-09-02 DIAGNOSIS — I5020 Unspecified systolic (congestive) heart failure: Secondary | ICD-10-CM | POA: Diagnosis not present

## 2024-09-03 DIAGNOSIS — I5020 Unspecified systolic (congestive) heart failure: Secondary | ICD-10-CM | POA: Diagnosis not present

## 2024-09-04 ENCOUNTER — Telehealth (HOSPITAL_COMMUNITY): Payer: Self-pay | Admitting: Cardiology

## 2024-09-04 DIAGNOSIS — I5020 Unspecified systolic (congestive) heart failure: Secondary | ICD-10-CM | POA: Diagnosis not present

## 2024-09-04 NOTE — Telephone Encounter (Signed)
 Called to confirm/remind patient of their appointment at the [facility] on 09/04/2024.   Appointment:   [] Confirmed  [] Left mess   [x] No answer/No voice mail  [] VM Full/unable to leave message  [] Phone not in service  Patient reminded to bring all medications and/or complete list.  Confirmed patient has transportation. Gave directions, instructed to utilize valet parking.

## 2024-09-05 DIAGNOSIS — I5020 Unspecified systolic (congestive) heart failure: Secondary | ICD-10-CM | POA: Diagnosis not present

## 2024-09-06 DIAGNOSIS — I5020 Unspecified systolic (congestive) heart failure: Secondary | ICD-10-CM | POA: Diagnosis not present

## 2024-09-07 ENCOUNTER — Encounter (HOSPITAL_COMMUNITY): Payer: Self-pay | Admitting: Cardiology

## 2024-09-07 ENCOUNTER — Ambulatory Visit (HOSPITAL_COMMUNITY)
Admission: RE | Admit: 2024-09-07 | Discharge: 2024-09-07 | Disposition: A | Discharge status: Home / Self Care | Source: Ambulatory Visit | Attending: Cardiology | Admitting: Cardiology

## 2024-09-07 VITALS — BP 110/70 | HR 86 | Wt 232.2 lb

## 2024-09-07 DIAGNOSIS — N1831 Chronic kidney disease, stage 3a: Secondary | ICD-10-CM

## 2024-09-07 DIAGNOSIS — I5020 Unspecified systolic (congestive) heart failure: Secondary | ICD-10-CM | POA: Diagnosis not present

## 2024-09-07 DIAGNOSIS — I4729 Other ventricular tachycardia: Secondary | ICD-10-CM

## 2024-09-07 DIAGNOSIS — I5022 Chronic systolic (congestive) heart failure: Secondary | ICD-10-CM

## 2024-09-07 DIAGNOSIS — Z008 Encounter for other general examination: Secondary | ICD-10-CM | POA: Insufficient documentation

## 2024-09-07 NOTE — Progress Notes (Signed)
 ADVANCED HEART FAILURE FOLLOW UP CLINIC NOTE  Referring Physician: Bevely Doffing, FNP  Primary Care: Bevely Doffing, FNP Primary Cardiologist:  HPI: James [name] is a 63 y.o. male who presents for follow up of chronic systolic heart failure.     Prior history of prostate cancer s/p EBRT and ADT, meningioma   HF dates back to 2011 when he was diagnosed with nonischemic cardiomyopathy.   He came to the [facility] in 2013 when he had a left heart cath with no coronary disease with a EF of 20%, he was started on low-dose GDMT at that time.  By 2013 his EF improved to 45%.  He followed yearly with Dr. Nishan until 2022 when he once again had a reduction in LVEF to 20 to 25%. Repeat heart cath demonstrated no significant coronary disease.     Since establishing care with us , Mr. Hallam has undergone evaluation for advanced therapies.  His CPX is suggestive of severe heart failure limitation and multiple right heart catheterizations demonstrate persistently reduced cardiac index less than 2 L/min/m.     Completed radiation for prostate cancer April 2025. He was also referred to Neurology for mass noted on CT of head.    Echo 7/25 showed EF  25-30%, G3DD, RV mildly reduced, moderate MR  Admitted 07/09/24 with cardiogenic shock. Previous admission within the last year for similar decompensation and was worked of for transplant/LVAD in 2024. Presented with lactic acidosis, AKI, and shock liver. Echo showed EF 10-15% with mod reduced RV. He was started on Milrinone  and aggressively diuresed with improvement of AKI and liver function. Failed attempt to wean from milrinone . Course further complicated by high PVC and NSVT burden, which has been decreased with mexiletine and amiodarone .      SUBJECTIVE:  Patient with longstanding history of cardiomyopathy, recently discharged from the hospital 07/2024 for cardiogenic shock requiring IV inotropes.  A tunneled line was placed during  admission and he was discharged on home inotropes.  He overall has been feeling fair on the medication, though has continued issues with nausea, aversion to certain foods.  During admission he was worked up for advanced therapies.  Original plan had been to follow-up with [facility] for consideration of transplant.  However, on discussion today, patient notes that he had been very interested in LVAD, his wife had a poor outcome with a family member who had transplant, and they are very concerned about the travel, FMLA needed, and potential waiting period prior to cardiac transplant.  We discussed that he is relatively stable on inotrope therapy but is a high risk for further decompensation.  We discussed the potential benefits LVAD therapy as well as transplant in light of his prostate cancer.  Has previously been discussed with [facility], would not be a barrier.  Push to have patient follow-up with [facility], but is leaning more towards about at this time.  Will arrange for him to meet LVAD patient next week in clinic and to think about his options next week at home.  PMH, current medications, allergies, social history, and family history reviewed in epic.  PHYSICAL EXAM: Vitals:   09/07/24 1102  BP: 110/70  Pulse: 86  SpO2: 100%   GENERAL: Well nourished and in no apparent distress at rest.  PULM:  Normal work of breathing, clear to auscultation bilaterally. Respirations are unlabored.  CARDIAC:  JVP: flat         Normal rate with regular rhythm. No murmurs, rubs or gallops.  Trace edema. Warm and well perfused extremities. ABDOMEN: Soft, non-tender, non-distended. NEUROLOGIC: Patient is oriented x3 with no focal or lateralizing neurologic deficits.    ASSESSMENT & PLAN:  Chronic systolic heart failure: Long history of nonischemic cardiomyopathy, previously symptoms were incongruent with low output on right heart cath and CPX.  Currently on home milrinone  at 0.25.  He is a reasonable candidate for both  transplant and LVAD, though would expect a mildly longer wait time given his blood type and size.  However he is fairly stable on therapy and we discussed that he would likely have time to wait.  He is currently leaning more towards LVAD, we will arrange for follow-up next week in clinic to discuss. - NYHA class III on milrinone , euvolemic - Decreased appetite concerning - Continue digoxin  0.125 daily - Continue milrinone  0.25 - Continue hydralazine  50 mg 3 times daily - Continue Entresto  24/26 mg twice daily - Continue hydralazine  50 mg 3 times daily - Continue spironolactone  25 mg daily - ACC/AHA stage D cardiomyopathy, ongoing discussion about advanced therapies  CKD Stage IIIa: Elevated to 2.3 during admission, improved with inotropes and diuresis. - Continue to follow  NSVT: - Continue amiodarone  200mg  daily - Biotronik ICD in place - Continue mexitil  200mg  TID  Atrial fibrillation: Paroxysmal. SR on exam today - Continue Eliquis .  Prostate cancer: - completed EBRT 4/25. Completed ST ADT.   Meningioma.  - Noted on CT 5/25, seen neurosx. CT scan during recent admission w/ stable size.   Follow up in 1 week  I spent 50 minutes caring for this patient today including face to face time, ordering and reviewing labs, reviewing records from hospital stay, previous workup, Dr. Sharol notes, discussing care with LVAD teams, seeing the patient, documenting in the record, and arranging follow ups.   Morene Brownie, MD Advanced Heart Failure Mechanical Circulatory Support 09/15/24

## 2024-09-07 NOTE — Patient Instructions (Signed)
 There has been no changes to your medications.  Your physician recommends that you schedule a follow-up appointment in: next week.  If you have any questions or concerns before your next appointment please send us  a message through [place] or call our office at [phone].    TO LEAVE A MESSAGE FOR THE NURSE SELECT OPTION 2, PLEASE LEAVE A MESSAGE INCLUDING: YOUR NAME DATE OF BIRTH CALL BACK NUMBER REASON FOR CALL**this is important as we prioritize the call backs  YOU WILL RECEIVE A CALL BACK THE SAME DAY AS LONG AS YOU CALL BEFORE 4:00 PM  At the [facility], you and your health needs are our priority. As part of our continuing mission to provide you with exceptional heart care, we have created designated Provider Care Teams. These Care Teams include your primary Cardiologist (physician) and Advanced Practice Providers (APPs- Physician Assistants and Nurse Practitioners) who all work together to provide you with the care you need, when you need it.   You may see any of the following providers on your designated Care Team at your next follow up: Dr Toribio Fuel Dr Ezra Shuck Dr. Morene Brownie Greig Mosses, NP Caffie Shed, [place] [facility] Dw[place], [place] Beckey Coe, NP [name], NP Ellouise Class, NP Tinnie Redman, PharmD Jaun Bash, PharmD   Please be sure to bring in all your medications bottles to every appointment.    Thank you for choosing  [facility]

## 2024-09-08 DIAGNOSIS — I5020 Unspecified systolic (congestive) heart failure: Secondary | ICD-10-CM | POA: Diagnosis not present

## 2024-09-09 DIAGNOSIS — I5020 Unspecified systolic (congestive) heart failure: Secondary | ICD-10-CM | POA: Diagnosis not present

## 2024-09-10 DIAGNOSIS — I5020 Unspecified systolic (congestive) heart failure: Secondary | ICD-10-CM | POA: Diagnosis not present

## 2024-09-11 DIAGNOSIS — I5020 Unspecified systolic (congestive) heart failure: Secondary | ICD-10-CM | POA: Diagnosis not present

## 2024-09-12 DIAGNOSIS — I5020 Unspecified systolic (congestive) heart failure: Secondary | ICD-10-CM | POA: Diagnosis not present

## 2024-09-13 DIAGNOSIS — I5020 Unspecified systolic (congestive) heart failure: Secondary | ICD-10-CM | POA: Diagnosis not present

## 2024-09-14 DIAGNOSIS — I5020 Unspecified systolic (congestive) heart failure: Secondary | ICD-10-CM | POA: Diagnosis not present

## 2024-09-15 ENCOUNTER — Ambulatory Visit (HOSPITAL_COMMUNITY)
Admission: RE | Admit: 2024-09-15 | Discharge: 2024-09-15 | Disposition: A | Source: Ambulatory Visit | Attending: Cardiology | Admitting: Cardiology

## 2024-09-15 VITALS — BP 135/89 | HR 85

## 2024-09-15 DIAGNOSIS — I5022 Chronic systolic (congestive) heart failure: Secondary | ICD-10-CM | POA: Diagnosis not present

## 2024-09-15 DIAGNOSIS — I428 Other cardiomyopathies: Secondary | ICD-10-CM | POA: Diagnosis not present

## 2024-09-15 DIAGNOSIS — I340 Nonrheumatic mitral (valve) insufficiency: Secondary | ICD-10-CM | POA: Diagnosis not present

## 2024-09-15 DIAGNOSIS — Z79899 Other long term (current) drug therapy: Secondary | ICD-10-CM | POA: Diagnosis not present

## 2024-09-15 DIAGNOSIS — Z923 Personal history of irradiation: Secondary | ICD-10-CM | POA: Insufficient documentation

## 2024-09-15 DIAGNOSIS — R110 Nausea: Secondary | ICD-10-CM

## 2024-09-15 DIAGNOSIS — I472 Ventricular tachycardia, unspecified: Secondary | ICD-10-CM | POA: Diagnosis not present

## 2024-09-15 DIAGNOSIS — Z8546 Personal history of malignant neoplasm of prostate: Secondary | ICD-10-CM | POA: Insufficient documentation

## 2024-09-15 DIAGNOSIS — N1831 Chronic kidney disease, stage 3a: Secondary | ICD-10-CM | POA: Diagnosis not present

## 2024-09-15 DIAGNOSIS — D329 Benign neoplasm of meninges, unspecified: Secondary | ICD-10-CM | POA: Insufficient documentation

## 2024-09-15 DIAGNOSIS — I5020 Unspecified systolic (congestive) heart failure: Secondary | ICD-10-CM | POA: Diagnosis not present

## 2024-09-15 DIAGNOSIS — Z7901 Long term (current) use of anticoagulants: Secondary | ICD-10-CM | POA: Insufficient documentation

## 2024-09-15 DIAGNOSIS — I480 Paroxysmal atrial fibrillation: Secondary | ICD-10-CM | POA: Insufficient documentation

## 2024-09-15 MED ORDER — ONDANSETRON HCL 4 MG PO TABS: 4.0000 mg | ORAL_TABLET | Freq: Three times a day (TID) | ORAL | 3 refills | Status: DC | PRN

## 2024-09-15 NOTE — Patient Instructions (Signed)
 No medication changes May take Zofran  as needed Return to VAD clinic in 1 month for follow up with Dr Zenaida

## 2024-09-15 NOTE — Progress Notes (Signed)
 Patient presents for VAD consult f/u in VAD Clinic today with his wife. Recent hospitalization with initiation of VAD evaluation.    Pt ambulated into clinic independently. Denies lightheadedness, dizziness, falls, shortness of breath, chest pain, palpitations, and signs of bleeding. Reports poor appetite. He is eating roughly 1 meal a day. Has not been able to eat bread at all over the last 6 weeks- has to spit it out every time he tries to eat it. Reports daily nausea following morning medications. He does not typically eat prior to taking medications. Advised eating a meal/snack prior to med administration to see if this helps to lessen nausea. Per Dr Zenaida- order received for PRN Zofran . Prescription sent to pt's pharmacy. Pt and wife verbalized understanding of medication.   Right chest tunneled PICC with Milrinone  0.25 mcg/kg/min. Placed pt on monitor today- SR 85 with frequent asymptomatic PVCs.   Current VAD patient in to see pt and wife. Had great discussion re: life with VAD. Long discussion with Dr Zenaida re: timing/readiness for VAD. Pt states he is ready to move forward with VAD implant, and is no longer interested in pursuing transplant. Advised to call [facility] team to notify of decision as he is scheduled for transplant eval next week. Both pt and wife verbalized understanding. Will plan to discuss pt at [facility] next week.   Discussed pt with Dr Daniel- she would like to see pt in the office on 09/17/24 at 8:45 AM. Attempted to reach pt- no answer/voicemail not set up. Discussed the above appt with pt's wife [name]. She verbalized understanding.   Message sent to Dr Sheryle regarding panorex results/dental consult.   Pt did not want to have labs drawn in clinic today as he had labs drawn last week via home health. Left voicemail for pt's [facility] [phone]) requesting recent lab results to be faxed to VAD office. Spoke with at [employer] at [facility] office- labs are scheduled to be  drawn today. Ariel unable to access labs drawn 2 weeks ago results in their system. Requested lab results be faxed to VAD coordinators. Provided with VAD office and fax numbers.    Vital Signs:  HR: 85 SR w/ PVCs BP: 135/85 (99) SPO2: 97%   Weight: 234.4  lb w/o eqt Last weight: 232 lb Home weights:  lbs   Symptom YES NO DETAILS  Angina  x Activity:  Claudication  x How Far:  Syncope  x When:  Stroke  x   Orthopnea  x How many pillows:  PND  x How often:  CPAP   How many hours:  Pedal Edema  x   Abdominal Fullness  x   Nausea / Vomit x    Diaphoresis   When:  Shortness of Breath  x Activity:  Palpitations   When: frequent asymptomatic PVCs  ICD shock  x   Bleeding S/S  x   Tea-colored Urine  x   Hospitalizations  x   Emergency Room  x   Other MD     Activity   Fluid Fluid status stable per Dr Zenaida  Diet Poor appetite   Device: Biotronik Therapies: VT/VF settings ON Last check: 08/17/24   Patient Instructions:  No medication changes May take Zofran  as needed Return to VAD clinic in 1 month for follow up with Dr Zenaida Isaiah Knoll RN VAD Coordinator  Office: [phone]  24/7 Pager: [phone]

## 2024-09-16 DIAGNOSIS — I5020 Unspecified systolic (congestive) heart failure: Secondary | ICD-10-CM | POA: Diagnosis not present

## 2024-09-17 ENCOUNTER — Ambulatory Visit

## 2024-09-17 VITALS — BP 122/87 | HR 73 | Resp 20 | Ht 78.0 in | Wt 231.8 lb

## 2024-09-17 DIAGNOSIS — I5020 Unspecified systolic (congestive) heart failure: Secondary | ICD-10-CM | POA: Diagnosis not present

## 2024-09-17 DIAGNOSIS — I5043 Acute on chronic combined systolic (congestive) and diastolic (congestive) heart failure: Secondary | ICD-10-CM | POA: Diagnosis not present

## 2024-09-17 DIAGNOSIS — I428 Other cardiomyopathies: Secondary | ICD-10-CM | POA: Diagnosis not present

## 2024-09-17 NOTE — Progress Notes (Signed)
 LVAD CLINIC NOTE  Referring Physician: Bevely Doffing, FNP  Primary Care: James Doffing, FNP Primary Cardiologist:  HPI: James [name] is a 63 y.o. male who presents for follow up of chronic systolic heart failure.     Prior history of prostate cancer s/p EBRT and ADT, meningioma   HF dates back to 2011 when he was diagnosed with nonischemic cardiomyopathy.   He came to the [facility] system in 2013 when he had a left heart cath with no coronary disease with a EF of 20%, he was started on low-dose GDMT at that time.  By 2013 his EF improved to 45%.  He followed yearly with Dr. Nishan until 2022 when he once again had a reduction in LVEF to 20 to 25%. Repeat heart cath demonstrated no significant coronary disease.     Since establishing care with us , James [name] has undergone evaluation for advanced therapies.  His CPX is suggestive of severe heart failure limitation and multiple right heart catheterizations demonstrate persistently reduced cardiac index less than 2 L/min/m.     Completed radiation for prostate cancer April 2025. He was also referred to Neurology for mass noted on CT of head.    Echo 7/25 showed EF  25-30%, G3DD, RV mildly reduced, moderate MR  Admitted 07/09/24 with cardiogenic shock. Previous admission within the last year for similar decompensation and was worked of for transplant/LVAD in 2024. Presented with lactic acidosis, AKI, and shock liver. Echo showed EF 10-15% with mod reduced RV. He was started on Milrinone  and aggressively diuresed with improvement of AKI and liver function. Failed attempt to wean from milrinone . Course further complicated by high PVC and NSVT burden, which has been decreased with mexiletine and amiodarone .      SUBJECTIVE:  Patient overall doing fair, went hunting the other day and was able to help move a deer he shot afterwards. Main complaint is nausea, certain foods have just not been going down well and his wife notes some  increased fatigue, loss of appetite, and he does not seem like himself.   We discussed plans for advanced therapies at length. He and his wife have decided that they would like to move forward with LVAD therapy at this time. We discussed timing of the operation with the Christmas holidays, and he would like to have surgery whenever possible, even if it meant being in the hospital during Christmas. Has been taking his medications as ordered. Needs dental clearance as well.   PMH, current medications, allergies, social history, and family history reviewed in epic.  PHYSICAL EXAM: Vitals:   09/15/24 1030  BP: 135/89  Pulse: 85  SpO2: 97%   GENERAL: NAD, well appearing PULM:  Normal work of breathing, CTAB CARDIAC:  JVP: mildly elevated         Normal rate with regular rhythm. No murmurs, rubs or gallops.  Trace edema. Warm and well perfused extremities. ABDOMEN: Soft, non-tender, non-distended. NEUROLOGIC: Patient is oriented x3 with no focal or lateralizing neurologic deficits.   DATA REVIEW:  EKG: 07/2024: NSR, LVH, LAE, LAFB  ECHO: 06/2024: LVEF 10-15%, LVIDD 7.1, RV mildly reduced  CATH: 11/2023: RA 6, PA 43/19, PCWP 17, PAPi 4, Fick CO/CI 4.2/1.7, TD 5.3/2.2  CPX: 01/01/2023: Exercise testing with gas exchange demonstrates a moderate to severely reduced peak VO2 of 15.1 ml/kg/min (56% of the age/gender/weight matched sedentary norms). The RER of 1.10 indicates a maximal effort. The VE/VCO2 slope is moderately elevated, O2pulse 30mL/beat  ASSESSMENT & PLAN:  Chronic systolic heart failure: Long history of nonischemic cardiomyopathy, previously symptoms were incongruent with low output on right heart cath and CPX.  Admitted with recurrent cardiogenic shock, currently on home milrinone  at 0.25.  Reasonable candidate for both LVAD and txp, has elected to move forward to LVAD.  - NYHA class III on milrinone , euvolemic - Decreased appetite concerning - Continue digoxin  0.125 -  Continue milrinone  0.25 - Continue hydralazine  50mg  TID - Continue Entresto  24/26 mg twice daily - Continue hydralazine  50 mg 3 times daily - Continue spironolactone  25 mg daily - ACC/AHA stage D cardiomyopathy, ongoing discussion about advanced therapies - Meets with Dr. Daniel 12/4, needs MRB discussion and surgical timing  CKD Stage IIIa: Elevated to 2.3 during admission, improved with inotropes and diuresis. - Continue to follow  NSVT: - Continue amiodarone  200mg  daily - Biotronik ICD in place - Continue mexitil  200mg  TID  Atrial fibrillation: Paroxysmal. SR on exam today - Continue Eliquis .  Prostate cancer: - completed EBRT 4/25. Completed ST ADT.   Meningioma.  - Noted on CT 5/25, seen neurosx. CT scan during recent admission w/ stable size.   Follow up in 1 month, would plan RHC and implant time pending MRB and surgical visit.  I spent 45 minutes caring for this patient today including face to face time, ordering and reviewing labs, reviewing records from multiple cardiac studies, discussing LVAD therapy, seeing the patient, documenting in the record, and arranging follow ups.    James Brownie, MD Advanced Heart Failure Mechanical Circulatory Support 09/17/24

## 2024-09-17 NOTE — Progress Notes (Signed)
 [address], [place] [address]             [phone]    [facility] Medical Record #[id number] Date of Birth: 1961-02-14  Referring: Zenaida Morene PARAS, MD Primary Care: Bevely Doffing, FNP Primary Cardiologist:Peter Delford, MD  Chief Complaint:    Chief Complaint  Patient presents with   Congestive Heart Failure    Further discuss surgery    History of Present Illness:     James [name] is a 63 y.o. male who presents for surgical evaluation of end-stage heart failure and LVAD discussion.  He has a history of NICM, Biotronik ICD, paroxysmal Afib/flutter on AC, NSVT, aortic root aneurysm, stage 3 CKD, HTN, HL, diabetes, former smoker and treated prostate CA.  I last saw him on 10/1 when he was admitted for decompensated heart failure.  At the time he strongly preferred transplant over LVAD, but he has since decided to proceed with LVAD given the likely long time waiting on the list for a heart transplant.  He has not had any re-admissions since September, and his main complaint is nausea and constipation.  He is only having a BM every 3-4 days, and when he gets nauseous, he is unable to eat very much. Otherwise he feels well.  He even went hunting recently and shot a deer and was able to drag it about 100 yards and load it onto the truck himself.  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  He continues to not smoker, he does not drink and is ready to proceed with surgery whenever possible.  He is right hand dominant.  Lastly, RHC in March 2024 demonstrated a severely elevated PVR of 5.33-8.8.  Most recent cath in Feb 2025 showed improvement of PVR, down to 2.5-3.1.  RHC (11/25/23): RA 6, RV 44/6, PA 43/19, PCW 17, CI 2.2, PVR 2.5-3.1, PAPi 4 TTE (07/10/24): LVEF 10-15%, Rv mild to moderately reduced, moderate MR, moderate TR, aortic root 4.6 cm, no AI Chest CT  (07/14/24): No significant aorta calcification PFT (12/13/22): FEV1 54%, DLCO 60%  Past Medical and Surgical History: Previous Chest Surgery: No Previous Chest Radiation: No Diabetes Mellitus: Yes.  HbA1C 6.0% (07/10/24) Anticoagulation: Eliquis   Creatinine:  Lab Results  Component Value Date   CREATININE 1.59 (H) 07/30/2024   CREATININE 1.96 (H) 07/23/2024   CREATININE 1.45 (H) 07/17/2024     Past Medical History:  Diagnosis Date   CKD (chronic kidney disease) stage 2, GFR 60-89 ml/min    COPD (chronic obstructive pulmonary disease) (HCC)    Diabetes mellitus type II, non insulin  dependent (HCC)    Dilated aortic root    Elevated PSA    Hypercholesteremia    Hypertension    NICM (nonischemic cardiomyopathy) (HCC)    NSVT (nonsustained ventricular tachycardia) (HCC)    Obstructive sleep apnea    Paroxysmal atrial fibrillation (HCC)    Pulmonary hypertension (HCC)    PVC's (premature ventricular contractions)    Tobacco abuse     Past Surgical History:  Procedure Laterality Date   CARDIAC CATHETERIZATION  10/15/2000   COLONOSCOPY N/A 03/22/2014   Procedure: COLONOSCOPY;  Surgeon: Margo LITTIE Haddock, MD;  Location: [facility];  Service: Endoscopy;  Laterality: N/A;  11:15 AM   ICD IMPLANT N/A 08/21/2021   Procedure: ICD IMPLANT;  Surgeon: Waddell Danelle ORN, MD;  Location: [facility];  Service: Cardiovascular;  Laterality: N/A;   INGUINAL HERNIA REPAIR Right 06/24/2017   Procedure: HERNIA REPAIR INGUINAL ADULT WITH MESH;  Surgeon: Mavis Anes, MD;  Location: [facility] OR[facility];  Service: General;  Laterality: Right;   IR TUNNELED CENTRAL VENOUS CATH PLC W IMG  07/17/2024   PROSTATE BIOPSY     RIGHT HEART CATH N/A 11/13/2022   Procedure: RIGHT HEART CATH;  Surgeon: Gardenia Led, DO;  Location: [facility];  Service: Cardiovascular;  Laterality: N/A;   RIGHT HEART CATH N/A 12/21/2022   Procedure: RIGHT HEART CATH;  Surgeon: Gardenia Led, DO;  Location: [facility]  [facility];  Service: Cardiovascular;  Laterality: N/A;   RIGHT HEART CATH N/A 11/25/2023   Procedure: RIGHT HEART CATH;  Surgeon: Gardenia Led, DO;  Location: [facility];  Service: Cardiovascular;  Laterality: N/A;   RIGHT/LEFT HEART CATH AND CORONARY ANGIOGRAPHY N/A 07/19/2021   Procedure: RIGHT/LEFT HEART CATH AND CORONARY ANGIOGRAPHY;  Surgeon: Verlin Lonni BIRCH, MD;  Location: [facility];  Service: Cardiovascular;  Laterality: N/A;   TEE WITHOUT CARDIOVERSION N/A 12/21/2022   Procedure: TRANSESOPHAGEAL ECHOCARDIOGRAM (TEE);  Surgeon: Gardenia Led, DO;  Location: [facility];  Service: Cardiovascular;  Laterality: N/A;    Social History:  Social History   Tobacco Use  Smoking Status Former   Current packs/day: 0.00   Average packs/day: 0.5 packs/day for 38.8 years (19.4 ttl pk-yrs)   Types: Cigarettes   Start date: 10/15/1982   Quit date: 07/18/2021   Years since quitting: 3.1  Smokeless Tobacco Never  Tobacco Comments   3 cigaretts a day    Social History   Substance and Sexual Activity  Alcohol Use No   Alcohol/week: 0.0 standard drinks of alcohol     No Known Allergies  Current Outpatient Medications  Medication Sig Dispense Refill   acetaminophen  (TYLENOL ) 500 MG tablet Take 1,000 mg by mouth every 6 (six) hours as needed for moderate pain.     albuterol  (VENTOLIN  HFA) 108 (90 Base) MCG/ACT inhaler INHALE 2 PUFFS BY MOUTH FOUR TIMES DAILY AS NEEDED 6.7 g 1   amiodarone  (PACERONE ) 200 MG tablet Take 1 tablet (200 mg total) by mouth daily. 30 tablet 5   apixaban  (ELIQUIS ) 5 MG TABS tablet Take 1 tablet (5 mg total) by mouth 2 (two) times daily. 60 tablet 11   atorvastatin  (LIPITOR) 40 MG tablet Take 1 tablet (40 mg total) by mouth daily. 90 tablet 3   digoxin  (LANOXIN ) 0.125 MG tablet Take 1 tablet (0.125 mg total) by mouth daily. 30 tablet 5   FARXIGA  10 MG TABS tablet TAKE 1 TABLET(10 MG) BY MOUTH DAILY BEFORE BREAKFAST 90 tablet 3    hydrALAZINE  (APRESOLINE ) 50 MG tablet Take 1 tablet (50 mg total) by mouth every 8 (eight) hours. 90 tablet 5   metFORMIN (GLUCOPHAGE) 500 MG tablet TAKE 1 TABLET BY MOUTH TWICE DAILY 180 tablet 1   mexiletine (MEXITIL ) 200 MG capsule Take 1 capsule (200 mg total) by mouth every 8 (eight) hours. 90 capsule 5   milrinone  (PRIMACOR ) 20 MG/100 ML SOLN infusion Inject 0.0294 mg/min into the vein continuous.     ondansetron  (ZOFRAN ) 4 MG tablet Take 1 tablet (4 mg total) by mouth every 8 (eight) hours as needed for nausea or vomiting. 20 tablet 3   potassium chloride  SA (KLOR-CON  M) 20 MEQ tablet Take 1 tablet (20 mEq total) by mouth daily. 30 tablet 3   sacubitril -valsartan  (ENTRESTO )  24-26 MG Take 1 tablet by mouth 2 (two) times daily. 90 tablet 2   spironolactone  (ALDACTONE ) 25 MG tablet Take 1 tablet (25 mg total) by mouth at bedtime. 90 tablet 3   torsemide  (DEMADEX ) 20 MG tablet Take 1 tablet (20 mg total) by mouth daily. 30 tablet 3   No current facility-administered medications for this visit.   Facility-Administered Medications Ordered in Other Visits  Medication Dose Route Frequency Provider Last Rate Last Admin   sodium chloride  flush (NS) 0.9 % injection 3 mL  3 mL Intravenous Q12H Sabharwal, Aditya, DO        (Not in a hospital admission)   Family History  Problem Relation Age of Onset   Coronary artery disease Mother    Diabetes Father    Heart attack Father    Hypertension Father    Iron deficiency Father    Thyroid  disease Sister    Cardiomyopathy Brother        No significant coronary disease by coronary angiography   Heart disease Maternal Grandfather        Also paternal grandfather   Sudden death Other    Colon cancer Neg Hx      Review of Systems:   Review of Systems  Constitutional:  Positive for malaise/fatigue. Negative for chills and fever.  Respiratory:  Negative for shortness of breath.   Cardiovascular:  Negative for chest pain and leg swelling.   Gastrointestinal:  Positive for nausea. Negative for abdominal pain and vomiting.  Neurological:  Negative for dizziness and headaches.      Physical Exam: BP 122/87 (BP Location: Right Arm, Patient Position: Sitting, Cuff Size: Large)   Pulse 73   Resp 20   Ht 6' 6 (1.981 m)   Wt 231 lb 12.8 oz (105.1 kg)   SpO2 96% Comment: RA  BMI 26.79 kg/m  Physical Exam Constitutional:      Appearance: Normal appearance.  HENT:     Head: Normocephalic and atraumatic.  Cardiovascular:     Rate and Rhythm: Normal rate and regular rhythm.     Heart sounds: No murmur heard. Abdominal:     General: There is no distension.     Palpations: Abdomen is soft.     Tenderness: There is no abdominal tenderness.  Musculoskeletal:        General: No swelling.  Neurological:     Mental Status: He is alert.       Diagnostic Studies & Laboratory data: Cardiac Studies & Procedures   ______________________________________________________________________________________________ CARDIAC CATHETERIZATION  CARDIAC CATHETERIZATION 11/25/2023  Conclusion HEMODYNAMICS: RA:   6 mmHg (mean) RV:   44/6 mmHg PA:   43/19 mmHg (30 mean) PCWP:  17 mmHg (mean)  Estimated Fick CO/CI   4.2 L/min, 1.7 L/min/m2 Thermodilution CO/CI   5.34 L/min, 2.2 L/min/m2  TPG    13  mmHg PVR     2.5-3.1 Wood Units PAPi      4   IMPRESSION: Mildly elevated pre and post capillary filling pressures Mildly elevated PA mean & PVR Moderate to severely reduced cardiac output & index  Aditya Sabharwal 10:13 AM   CARDIAC CATHETERIZATION  CARDIAC CATHETERIZATION 12/21/2022  Conclusion HEMODYNAMICS: RA:   8 mmHg (mean) RV:   83/7 mmHg PA:   79/34 mmHg (51 mean) PCWP:  25 mmHg (mean)  Estimated Fick CO/CI   3.3 L/min, 1.3 L/min/m2 Thermodilution CO/CI   4.9 L/min, 1.9 L/min/m2 NIBP: 125/88  TPG    26  mmHg PVR  5.3-8.8 Wood Units PAPi      5.6   IMPRESSION: Moderately elevated pre and post capillary  filling pressures. 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 Severely reduced cardiac output / cardiac index by Fick & Thermodilution (TD likely more reflective of true output).  RECOMMENDATIONS: CPX to complete evaluation for LVAD.  Aditya Sabharwal Advanced Heart Failure 10:58 AM   STRESS TESTS  NM MYOCAR MULTI W/SPECT W 07/03/2021  Narrative   Findings are consistent with prior anteroseptal and inferolateral myocardial infarction. There is no current ischemia. The study is high risk. Risk based on scar and decreased LVEF, consider correlating LVEF with echo.   No ST deviation was noted. Converted to pharmacological stress test due to frequent ventricular ectopy with exercise. PVCs, bigeminy, and accelerated idioventricular rhythm noted.   LV perfusion is abnormal.   Left ventricular function is abnormal. Nuclear stress EF: 22 %. The left ventricular ejection fraction is severely decreased (<30%). End systolic cavity size is severely enlarged.   ECHOCARDIOGRAM  ECHOCARDIOGRAM LIMITED 07/10/2024  Narrative ECHOCARDIOGRAM LIMITED REPORT    Patient Name:   James [name] Date of Exam: 07/10/2024 Medical Rec #:  [id number]         Height:       78.0 in Accession #:    [id number]        Weight:       259.3 lb Date of Birth:  05/05/1961         BSA:          2.521 m Patient Age:    63 years          BP:           124/83 mmHg Patient Gender: M                 HR:           65 bpm. Exam Location:  [facility]  Procedure: Intracardiac Opacification Agent, Cardiac Doppler, Limited Color Doppler and Limited Echo (Both Spectral and Color Flow Doppler were utilized during procedure).  Indications:    Congestive Heart Failure I50.9  History:        Patient has prior history of Echocardiogram examinations, most recent 05/14/2024.  Sonographer:    Koleen Popper RDCS Referring Phys: [id number] DORN DAWSON   Sonographer Comments:  Image acquisition challenging due to patient body habitus. IMPRESSIONS   1. Left ventricular ejection fraction, by estimation, is 10-15%. The left ventricle demonstrates global hypokinesis. The left ventricular internal cavity size was severely dilated. Left ventricular diastolic parameters are indeterminate. 2. Right ventricular systolic function mild to moderately reduced. The right ventricular size is mildly enlarged. There is moderately elevated pulmonary artery systolic pressure. 3. Moderate mitral valve regurgitation. No evidence of mitral stenosis. 4. The tricuspid valve is abnormal. Tricuspid valve regurgitation is moderate. 5. The inferior vena cava is dilated in size with <50% respiratory variability, suggesting right atrial pressure of 15 mmHg. 6. Limited echo evaluate LV and RV function.  FINDINGS Left Ventricle: Left ventricular ejection fraction, by estimation, is 10-15%. The left ventricle demonstrates global hypokinesis. Definity  contrast agent was given IV to delineate the left ventricular endocardial borders. The left ventricular internal cavity size was severely dilated. Left ventricular diastolic parameters are indeterminate.  Right Ventricle: The right ventricular size is mildly enlarged. Right vetricular wall thickness was not well visualized. Right ventricular systolic function mild to moderately reduced. There is moderately elevated pulmonary artery systolic pressure. The  tricuspid regurgitant velocity is 3.21 m/s, and with an assumed right atrial pressure of 15 mmHg, the estimated right ventricular systolic pressure is 56.2 mmHg.  Mitral Valve: Moderate mitral valve regurgitation. No evidence of mitral valve stenosis.  Tricuspid Valve: The tricuspid valve is abnormal. Tricuspid valve regurgitation is moderate . No evidence of tricuspid stenosis.  Aortic Valve: Aortic valve mean gradient measures 1.9 mmHg. Aortic valve peak gradient measures 3.2 mmHg. Aortic valve area,  by VTI measures 2.72 cm.  Venous: The inferior vena cava is dilated in size with less than 50% respiratory variability, suggesting right atrial pressure of 15 mmHg.  Additional Comments: Spectral Doppler performed. Color Doppler performed.  LEFT VENTRICLE PLAX 2D LVIDd:         7.10 cm LVIDs:         6.90 cm LV PW:         0.80 cm LV IVS:        0.80 cm LVOT diam:     2.60 cm LV SV:         37 LV SV Index:   15 LVOT Area:     5.31 cm   RIGHT VENTRICLE         IVC TAPSE (M-mode): 1.2 cm  IVC diam: 3.80 cm  LEFT ATRIUM              Index LA diam:        5.10 cm  2.02 cm/m LA Vol (A2C):   132.0 ml 52.37 ml/m LA Vol (A4C):   106.0 ml 42.05 ml/m LA Biplane Vol: 123.0 ml 48.79 ml/m AORTIC VALVE AV Area (Vmax):    3.10 cm AV Area (Vmean):   2.52 cm AV Area (VTI):     2.72 cm AV Vmax:           89.84 cm/s AV Vmean:          67.270 cm/s AV VTI:            0.137 m AV Peak Grad:      3.2 mmHg AV Mean Grad:      1.9 mmHg LVOT Vmax:         52.40 cm/s LVOT Vmean:        31.900 cm/s LVOT VTI:          0.070 m LVOT/AV VTI ratio: 0.51  AORTA Ao Root diam: 4.60 cm  MR Peak grad: 74.6 mmHg   TRICUSPID VALVE MR Vmax:      432.00 cm/s TR Peak grad:   41.2 mmHg TR Vmax:        321.00 cm/s  SHUNTS Systemic VTI:  0.07 m Systemic Diam: 2.60 cm  Dorn Ross MD Electronically signed by Dorn Ross MD Signature Date/Time: 07/10/2024/4:18:20 PM    Final   TEE  ECHO TEE 12/21/2022  Narrative TRANSESOPHOGEAL ECHO REPORT    Patient Name:   James [name] Date of Exam: 12/21/2022 Medical Rec #:  [id number]         Height:       79.0 in Accession #:    [id number]        Weight:       253.0 lb Date of Birth:  11/12/1960         BSA:          2.518 m Patient Age:    61 years          BP:           115/76  mmHg Patient Gender: M                 HR:           83 bpm. Exam Location:  Inpatient  Procedure: Transesophageal Echo, 3D Echo and Color  Doppler  Indications:     Aortic Root Aneurysm  History:         Patient has prior history of Echocardiogram examinations, most recent 09/11/2022. CHF; Risk Factors:Hypertension.  Sonographer:     Lauraine Pilot RDCS Referring Phys:  [id number] RIA COMMANDER Diagnosing Phys: Ria Commander  PROCEDURE: After discussion of the risks and benefits of a TEE, an informed consent was obtained from the patient. The transesophogeal probe was passed without difficulty through the esophogus of the patient. Sedation performed by different physician. The patient was monitored while under deep sedation. Anesthestetic sedation was provided intravenously by Anesthesiology: 428.18mg  of Propofol , 140mg  of Lidocaine . The patient's vital signs; including heart rate, blood pressure, and oxygen  saturation; remained stable throughout the procedure. The patient developed no complications during the procedure.  IMPRESSIONS   1. Left ventricular ejection fraction, by estimation, is 20 to 25%. The left ventricle has severely decreased function. The left ventricular internal cavity size was mildly to moderately dilated. 2. Right ventricular systolic function is mildly reduced. The right ventricular size is normal. 3. Left atrial size was moderately dilated. No left atrial/left atrial appendage thrombus was detected. 4. The mitral valve is normal in structure. Trivial mitral valve regurgitation. 5. The aortic valve is normal in structure. Aortic valve regurgitation is not visualized. 6. The aortic root appears aneurysmal at the level of the sinuses of Valsalva (near the RCC) measuring up to 4.7cm. There is no aortic regurgitation.  FINDINGS Left Ventricle: Left ventricular ejection fraction, by estimation, is 20 to 25%. The left ventricle has severely decreased function. The left ventricular internal cavity size was mildly to moderately dilated.  Right Ventricle: The right ventricular size is normal. No increase in  right ventricular wall thickness. Right ventricular systolic function is mildly reduced.  Left Atrium: Left atrial size was moderately dilated. No left atrial/left atrial appendage thrombus was detected.  Right Atrium: Right atrial size was normal in size.  Pericardium: There is no evidence of pericardial effusion.  Mitral Valve: The mitral valve is normal in structure. Trivial mitral valve regurgitation.  Tricuspid Valve: The tricuspid valve is normal in structure. Tricuspid valve regurgitation is trivial.  Aortic Valve: The aortic valve is normal in structure. Aortic valve regurgitation is not visualized.  Pulmonic Valve: The pulmonic valve was normal in structure. Pulmonic valve regurgitation is trivial.  Aorta: There is an aneurysm involving the aortic root measuring 47 mm.  IAS/Shunts: No atrial level shunt detected by color flow Doppler.  Additional Comments: A device lead is visualized.  Ria Commander Electronically signed by Ria Commander Signature Date/Time: 01/01/2023/2:17:24 PM    Final        ______________________________________________________________________________________________     EKG: 08/12/24 - NSR I have independently reviewed the above radiologic studies and discussed with the patient   Recent Lab Findings: Lab Results  Component Value Date   WBC 10.1 07/23/2024   HGB 17.1 (H) 07/23/2024   HCT 50.0 07/23/2024   PLT 222 07/23/2024   GLUCOSE 160 (H) 07/30/2024   CHOL 134 07/10/2024   TRIG 93 07/10/2024   HDL 33 (L) 07/10/2024   LDLCALC 82 07/10/2024   ALT 92 (H) 07/23/2024   AST 36 07/23/2024   NA 132 (L)  07/30/2024   K 4.1 07/30/2024   CL 95 (L) 07/30/2024   CREATININE 1.59 (H) 07/30/2024   BUN 18 07/30/2024   CO2 24 07/30/2024   TSH 1.794 07/10/2024   INR 1.4 (H) 07/14/2024   HGBA1C 6.0 (H) 07/10/2024      Assessment / Plan:   Mr. Minish is a pleasant 63 year old man with non-ischemic cardiomyopathy who was admitted  from [facility] with acute on chronic decompensated heart failure in September.  He underwent diuresis and was discharged home on milrinone .  He had planned to pursue heart transplant but has since decided to go forward with LVAD given the long wait times for a heart. From a symptom standpoint, he has done well aside from nausea that limits his PO intake.   In regards to LVAD, he is overall fit and seems to be a good candidate for surgery.  He has not had previous chest surgery or radiation.  He does have a dilated root but no AI.  He also has moderate TR and mild-mod RV dysfunction.  I explained to him the possible need for Protek if his RV requires temporary mechanical support following LVAD implantation.   I explained to him the specifics regarding LVAD implantation and what to expect in terms of post-op recovery.  I discussed the general nature of the procedure, including the need for general anesthesia, the incisions to be used, the use of cardiopulmonary bypass, and the use of temporary pacemaker wires and drainage tubes postoperatively with him and his wife.  We discussed the expected hospital stay, overall recovery and short and long term outcomes. I informed them of the indications, risks, benefits and alternatives.   They understand the risks include, but are not limited to death, stroke, MI, DVT/PE, bleeding, possible need for transfusion, infections, cardiac arrhythmias, as well as other organ system dysfunction including respiratory (eg: prolonged ventilation), renal, or GI complications.   He may need teeth extracted.  VAD coordinators to discuss this with Dr. Sheryle.  I  spent 30 minutes counseling the patient face to face.   Con RAMAN Ilhan Madan 09/17/2024 9:06 AM

## 2024-09-17 NOTE — H&P (View-Only) (Signed)
 LVAD CLINIC NOTE  Referring Physician: Bevely Doffing, FNP  Primary Care: Bevely Doffing, FNP Primary Cardiologist:  HPI: James [name] is a 63 y.o. male who presents for follow up of chronic systolic heart failure.     Prior history of prostate cancer s/p EBRT and ADT, meningioma   HF dates back to 2011 when he was diagnosed with nonischemic cardiomyopathy.   He came to the [facility] system in 2013 when he had a left heart cath with no coronary disease with a EF of 20%, he was started on low-dose GDMT at that time.  By 2013 his EF improved to 45%.  He followed yearly with Dr. Nishan until 2022 when he once again had a reduction in LVEF to 20 to 25%. Repeat heart cath demonstrated no significant coronary disease.     Since establishing care with us , [name] has undergone evaluation for advanced therapies.  His CPX is suggestive of severe heart failure limitation and multiple right heart catheterizations demonstrate persistently reduced cardiac index less than 2 L/min/m.     Completed radiation for prostate cancer April 2025. He was also referred to Neurology for mass noted on CT of head.    Echo 7/25 showed EF  25-30%, G3DD, RV mildly reduced, moderate MR  Admitted 07/09/24 with cardiogenic shock. Previous admission within the last year for similar decompensation and was worked of for transplant/LVAD in 2024. Presented with lactic acidosis, AKI, and shock liver. Echo showed EF 10-15% with mod reduced RV. He was started on Milrinone  and aggressively diuresed with improvement of AKI and liver function. Failed attempt to wean from milrinone . Course further complicated by high PVC and NSVT burden, which has been decreased with mexiletine and amiodarone .      SUBJECTIVE:  Patient overall doing fair, went hunting the other day and was able to help move a deer he shot afterwards. Main complaint is nausea, certain foods have just not been going down well and his wife notes some  increased fatigue, loss of appetite, and he does not seem like himself.   We discussed plans for advanced therapies at length. He and his wife have decided that they would like to move forward with LVAD therapy at this time. We discussed timing of the operation with the Christmas holidays, and he would like to have surgery whenever possible, even if it meant being in the hospital during Christmas. Has been taking his medications as ordered. Needs dental clearance as well.   PMH, current medications, allergies, social history, and family history reviewed in epic.  PHYSICAL EXAM: Vitals:   09/15/24 1030  BP: 135/89  Pulse: 85  SpO2: 97%   GENERAL: NAD, well appearing PULM:  Normal work of breathing, CTAB CARDIAC:  JVP: mildly elevated         Normal rate with regular rhythm. No murmurs, rubs or gallops.  Trace edema. Warm and well perfused extremities. ABDOMEN: Soft, non-tender, non-distended. NEUROLOGIC: Patient is oriented x3 with no focal or lateralizing neurologic deficits.   DATA REVIEW:  EKG: 07/2024: NSR, LVH, LAE, LAFB  ECHO: 06/2024: LVEF 10-15%, LVIDD 7.1, RV mildly reduced  CATH: 11/2023: RA 6, PA 43/19, PCWP 17, PAPi 4, Fick CO/CI 4.2/1.7, TD 5.3/2.2  CPX: 01/01/2023: Exercise testing with gas exchange demonstrates a moderate to severely reduced peak VO2 of 15.1 ml/kg/min (56% of the age/gender/weight matched sedentary norms). The RER of 1.10 indicates a maximal effort. The VE/VCO2 slope is moderately elevated, O2pulse 30mL/beat  ASSESSMENT & PLAN:  Chronic systolic heart failure: Long history of nonischemic cardiomyopathy, previously symptoms were incongruent with low output on right heart cath and CPX.  Admitted with recurrent cardiogenic shock, currently on home milrinone  at 0.25.  Reasonable candidate for both LVAD and txp, has elected to move forward to LVAD.  - NYHA class III on milrinone , euvolemic - Decreased appetite concerning - Continue digoxin  0.125 -  Continue milrinone  0.25 - Continue hydralazine  50mg  TID - Continue Entresto  24/26 mg twice daily - Continue hydralazine  50 mg 3 times daily - Continue spironolactone  25 mg daily - ACC/AHA stage D cardiomyopathy, ongoing discussion about advanced therapies - Meets with Dr. Daniel 12/4, needs MRB discussion and surgical timing  CKD Stage IIIa: Elevated to 2.3 during admission, improved with inotropes and diuresis. - Continue to follow  NSVT: - Continue amiodarone  200mg  daily - Biotronik ICD in place - Continue mexitil  200mg  TID  Atrial fibrillation: Paroxysmal. SR on exam today - Continue Eliquis .  Prostate cancer: - completed EBRT 4/25. Completed ST ADT.   Meningioma.  - Noted on CT 5/25, seen neurosx. CT scan during recent admission w/ stable size.   Follow up in 1 month, would plan RHC and implant time pending MRB and surgical visit.  I spent 45 minutes caring for this patient today including face to face time, ordering and reviewing labs, reviewing records from multiple cardiac studies, discussing LVAD therapy, seeing the patient, documenting in the record, and arranging follow ups.    James Brownie, MD Advanced Heart Failure Mechanical Circulatory Support 09/17/24

## 2024-09-18 DIAGNOSIS — I5020 Unspecified systolic (congestive) heart failure: Secondary | ICD-10-CM | POA: Diagnosis not present

## 2024-09-19 DIAGNOSIS — I5020 Unspecified systolic (congestive) heart failure: Secondary | ICD-10-CM | POA: Diagnosis not present

## 2024-09-20 DIAGNOSIS — I5020 Unspecified systolic (congestive) heart failure: Secondary | ICD-10-CM | POA: Diagnosis not present

## 2024-09-21 ENCOUNTER — Telehealth (HOSPITAL_COMMUNITY): Payer: Self-pay | Admitting: Licensed Clinical Social Worker

## 2024-09-21 DIAGNOSIS — I5020 Unspecified systolic (congestive) heart failure: Secondary | ICD-10-CM | POA: Diagnosis not present

## 2024-09-21 NOTE — Telephone Encounter (Signed)
 CSW received return call from pt wife to discuss caregiver responsibilities.  Caregiver questions Please explain what you hope will be improved about your life and loved one's life as a result of receiving the LVAD? Hope that his quality of life will improve as he has not been himself lately due to feeling so poorly. What is your biggest concern or fear about caregiving with an LVAD patient? None- feels able to handle all caregiving responsibilities laid out by team.   What is your plan for availability to provide care 24/7 x2 weeks post op and dressing changes ongoing?Will be applying for FMLA from work to be there with patient following surgery. Who is the relief/backup caregiver and what is their availability? Other family members including daughter and son who live locally.  Preferred method of learning? Written, Verbal, and Hands on   Do you drive? yes How do you handle stressful situations? Will smoke when stressed- states she smokes outside of the home.  Stress never prevents her from completing what she needs to do. Do you think you can do this? yes Is there anything that concerns about caregiving? no  Do you provide caregiving to anyone else? no     Caregiver's current level of motivation to prepare for LVAD: fully motivated to move forward Caregiver's present level of consent for LVAD: is totally on board with surgery if this is what he needs to feel better.  CSW will continue to follow and assist as needed.  Andriette HILARIO Leech, LCSW Clinical Social Worker Advanced Heart Failure Clinic Desk#: [phone] Cell#: [phone]

## 2024-09-21 NOTE — Telephone Encounter (Signed)
 CSW informed by LVAD Coordinators that patient work up for LVAD implant is now restarted and that we plan to discuss his case in MRB this week.  CSW has completed assessment with patient but had not spoken with patient wife as plan had shifted to transplant eval at that time.  CSW attempted to call pt but unable to reach or leave VM.  Called wife and left VM requesting return call  CSW will continue to reach out in order to complete caregiver assessment  Andriette HILARIO Leech, LCSW Clinical Social Worker Advanced [facility] Failure Clinic Desk#: [phone] Cell#: [phone]

## 2024-09-22 DIAGNOSIS — I5020 Unspecified systolic (congestive) heart failure: Secondary | ICD-10-CM | POA: Diagnosis not present

## 2024-09-23 ENCOUNTER — Other Ambulatory Visit (HOSPITAL_COMMUNITY): Payer: Self-pay

## 2024-09-23 ENCOUNTER — Telehealth (HOSPITAL_COMMUNITY): Payer: Self-pay | Admitting: *Deleted

## 2024-09-23 DIAGNOSIS — I5020 Unspecified systolic (congestive) heart failure: Secondary | ICD-10-CM

## 2024-09-23 DIAGNOSIS — I5023 Acute on chronic systolic (congestive) heart failure: Secondary | ICD-10-CM

## 2024-09-23 NOTE — Telephone Encounter (Signed)
 Pt approved for VAD implant at [facility] this morning. Will plan to admit patient following RHC on 10/01/24. VAD implant scheduled 10/06/24 at 0730 with Dr Lucas. Pt will need dental extractions addressed post VAD implant as neither oral surgeon is available for extractions prior to implant.   RHC scheduled 10/01/24 at 0730. Pt will need to arrive to admitting at 0530. NPO past midnight. Orders placed for RHC with Dr Rolan per Dr Zenaida. Dr Rolan aware.  Called and left voicemail for [name] with RHC date/time. Requested call back to clinic to further discuss admission plan.   Isaiah Knoll RN VAD Coordinator  Office: [phone]  24/7 Pager: [phone]

## 2024-09-24 ENCOUNTER — Encounter (HOSPITAL_COMMUNITY): Payer: Self-pay | Admitting: Anesthesiology

## 2024-09-24 ENCOUNTER — Telehealth (HOSPITAL_COMMUNITY): Payer: Self-pay

## 2024-09-24 DIAGNOSIS — I5020 Unspecified systolic (congestive) heart failure: Secondary | ICD-10-CM | POA: Diagnosis not present

## 2024-09-24 NOTE — Telephone Encounter (Signed)
 Attempted to call pt to inform of scheduled RHC 12/18 at 0730 with Dr. Rolan. Voicemail left with the following instructions:   Pt to be NPO the night prior to procedure and arrive to check into admitting at 0530am. Pt will be admitted following RHC for optimization for LVAD implant scheduled for 12/23 with Dr. Daniel.  Requested return phone call to confirm message was received.  Schuyler Lunger RN, BSN VAD Coordinator 24/7 Pager [phone]

## 2024-09-24 NOTE — Telephone Encounter (Signed)
 VAD Coordinator received return phone call from pt's wife [name] confirm procedure instructions were received. All questions answered.  Schuyler Lunger RN, BSN VAD Coordinator 24/7 Pager [phone]

## 2024-09-25 ENCOUNTER — Telehealth (HOSPITAL_COMMUNITY): Payer: Self-pay | Admitting: *Deleted

## 2024-09-25 DIAGNOSIS — I5020 Unspecified systolic (congestive) heart failure: Secondary | ICD-10-CM | POA: Diagnosis not present

## 2024-09-25 NOTE — Telephone Encounter (Addendum)
 Spoke with pt's wife re: plan for RHC with admission to follow on 12/18 for LVAD implant 12/23. Pt to STOP Eliquis  on 09/30/24 per Dr Lucas. Pt to arrive at Admitting by 0530 on 12/18 for RHC on 12/18. Nothing to eat or drink after midnight on 12/18. She verbalized understanding to all the above instructions.   Isaiah Knoll RN VAD Coordinator  Office: [phone]  24/7 Pager: [phone]

## 2024-09-26 DIAGNOSIS — I5020 Unspecified systolic (congestive) heart failure: Secondary | ICD-10-CM | POA: Diagnosis not present

## 2024-09-27 DIAGNOSIS — I5020 Unspecified systolic (congestive) heart failure: Secondary | ICD-10-CM | POA: Diagnosis not present

## 2024-09-28 DIAGNOSIS — I5020 Unspecified systolic (congestive) heart failure: Secondary | ICD-10-CM | POA: Diagnosis not present

## 2024-09-29 DIAGNOSIS — I5020 Unspecified systolic (congestive) heart failure: Secondary | ICD-10-CM | POA: Diagnosis not present

## 2024-09-30 DIAGNOSIS — I5020 Unspecified systolic (congestive) heart failure: Secondary | ICD-10-CM | POA: Diagnosis not present

## 2024-10-01 ENCOUNTER — Encounter (HOSPITAL_COMMUNITY)
Admission: AD | Disposition: A | Discharge status: Rehabilitation Facility | Payer: Self-pay | Source: Home / Self Care | Attending: Cardiology

## 2024-10-01 ENCOUNTER — Other Ambulatory Visit: Payer: Self-pay

## 2024-10-01 ENCOUNTER — Inpatient Hospital Stay (HOSPITAL_COMMUNITY)

## 2024-10-01 ENCOUNTER — Inpatient Hospital Stay (HOSPITAL_COMMUNITY)
Admission: AD | Admit: 2024-10-01 | Discharge: 2024-11-02 | DRG: 001 | Disposition: A | Discharge status: Rehabilitation Facility | Attending: Cardiology | Admitting: Cardiology

## 2024-10-01 ENCOUNTER — Encounter (HOSPITAL_COMMUNITY): Payer: Self-pay | Admitting: Cardiology

## 2024-10-01 DIAGNOSIS — J811 Chronic pulmonary edema: Secondary | ICD-10-CM | POA: Diagnosis not present

## 2024-10-01 DIAGNOSIS — I77810 Thoracic aortic ectasia: Secondary | ICD-10-CM | POA: Diagnosis present

## 2024-10-01 DIAGNOSIS — J9620 Acute and chronic respiratory failure, unspecified whether with hypoxia or hypercapnia: Secondary | ICD-10-CM | POA: Diagnosis not present

## 2024-10-01 DIAGNOSIS — N183 Chronic kidney disease, stage 3 unspecified: Secondary | ICD-10-CM | POA: Diagnosis not present

## 2024-10-01 DIAGNOSIS — G4700 Insomnia, unspecified: Secondary | ICD-10-CM | POA: Diagnosis not present

## 2024-10-01 DIAGNOSIS — I5041 Acute combined systolic (congestive) and diastolic (congestive) heart failure: Secondary | ICD-10-CM | POA: Diagnosis not present

## 2024-10-01 DIAGNOSIS — D72829 Elevated white blood cell count, unspecified: Secondary | ICD-10-CM | POA: Diagnosis not present

## 2024-10-01 DIAGNOSIS — F32A Depression, unspecified: Secondary | ICD-10-CM | POA: Diagnosis present

## 2024-10-01 DIAGNOSIS — Z95811 Presence of heart assist device: Secondary | ICD-10-CM

## 2024-10-01 DIAGNOSIS — I480 Paroxysmal atrial fibrillation: Secondary | ICD-10-CM | POA: Diagnosis not present

## 2024-10-01 DIAGNOSIS — N1832 Chronic kidney disease, stage 3b: Secondary | ICD-10-CM | POA: Diagnosis present

## 2024-10-01 DIAGNOSIS — I5021 Acute systolic (congestive) heart failure: Secondary | ICD-10-CM

## 2024-10-01 DIAGNOSIS — F05 Delirium due to known physiological condition: Secondary | ICD-10-CM | POA: Diagnosis not present

## 2024-10-01 DIAGNOSIS — D62 Acute posthemorrhagic anemia: Secondary | ICD-10-CM | POA: Diagnosis not present

## 2024-10-01 DIAGNOSIS — K219 Gastro-esophageal reflux disease without esophagitis: Secondary | ICD-10-CM | POA: Diagnosis present

## 2024-10-01 DIAGNOSIS — Z978 Presence of other specified devices: Secondary | ICD-10-CM

## 2024-10-01 DIAGNOSIS — K5903 Drug induced constipation: Secondary | ICD-10-CM | POA: Diagnosis not present

## 2024-10-01 DIAGNOSIS — I5082 Biventricular heart failure: Secondary | ICD-10-CM | POA: Diagnosis not present

## 2024-10-01 DIAGNOSIS — Z95810 Presence of automatic (implantable) cardiac defibrillator: Secondary | ICD-10-CM

## 2024-10-01 DIAGNOSIS — I4811 Longstanding persistent atrial fibrillation: Secondary | ICD-10-CM

## 2024-10-01 DIAGNOSIS — E119 Type 2 diabetes mellitus without complications: Secondary | ICD-10-CM | POA: Diagnosis not present

## 2024-10-01 DIAGNOSIS — Z8249 Family history of ischemic heart disease and other diseases of the circulatory system: Secondary | ICD-10-CM

## 2024-10-01 DIAGNOSIS — E871 Hypo-osmolality and hyponatremia: Secondary | ICD-10-CM | POA: Diagnosis present

## 2024-10-01 DIAGNOSIS — I272 Pulmonary hypertension, unspecified: Secondary | ICD-10-CM | POA: Diagnosis present

## 2024-10-01 DIAGNOSIS — E7800 Pure hypercholesterolemia, unspecified: Secondary | ICD-10-CM | POA: Diagnosis present

## 2024-10-01 DIAGNOSIS — R000 Tachycardia, unspecified: Secondary | ICD-10-CM | POA: Diagnosis not present

## 2024-10-01 DIAGNOSIS — L89156 Pressure-induced deep tissue damage of sacral region: Secondary | ICD-10-CM | POA: Diagnosis not present

## 2024-10-01 DIAGNOSIS — I509 Heart failure, unspecified: Secondary | ICD-10-CM

## 2024-10-01 DIAGNOSIS — K30 Functional dyspepsia: Secondary | ICD-10-CM | POA: Diagnosis not present

## 2024-10-01 DIAGNOSIS — Z794 Long term (current) use of insulin: Secondary | ICD-10-CM | POA: Diagnosis not present

## 2024-10-01 DIAGNOSIS — I472 Ventricular tachycardia, unspecified: Secondary | ICD-10-CM | POA: Diagnosis present

## 2024-10-01 DIAGNOSIS — I428 Other cardiomyopathies: Secondary | ICD-10-CM | POA: Diagnosis present

## 2024-10-01 DIAGNOSIS — K7200 Acute and subacute hepatic failure without coma: Secondary | ICD-10-CM | POA: Diagnosis present

## 2024-10-01 DIAGNOSIS — I493 Ventricular premature depolarization: Secondary | ICD-10-CM | POA: Diagnosis present

## 2024-10-01 DIAGNOSIS — R0989 Other specified symptoms and signs involving the circulatory and respiratory systems: Secondary | ICD-10-CM | POA: Diagnosis not present

## 2024-10-01 DIAGNOSIS — I517 Cardiomegaly: Secondary | ICD-10-CM | POA: Diagnosis not present

## 2024-10-01 DIAGNOSIS — D649 Anemia, unspecified: Secondary | ICD-10-CM | POA: Diagnosis not present

## 2024-10-01 DIAGNOSIS — N179 Acute kidney failure, unspecified: Secondary | ICD-10-CM | POA: Diagnosis present

## 2024-10-01 DIAGNOSIS — R570 Cardiogenic shock: Secondary | ICD-10-CM | POA: Insufficient documentation

## 2024-10-01 DIAGNOSIS — J449 Chronic obstructive pulmonary disease, unspecified: Secondary | ICD-10-CM | POA: Diagnosis present

## 2024-10-01 DIAGNOSIS — Z8546 Personal history of malignant neoplasm of prostate: Secondary | ICD-10-CM

## 2024-10-01 DIAGNOSIS — I429 Cardiomyopathy, unspecified: Secondary | ICD-10-CM | POA: Diagnosis not present

## 2024-10-01 DIAGNOSIS — R609 Edema, unspecified: Secondary | ICD-10-CM | POA: Diagnosis not present

## 2024-10-01 DIAGNOSIS — L89152 Pressure ulcer of sacral region, stage 2: Secondary | ICD-10-CM | POA: Diagnosis not present

## 2024-10-01 DIAGNOSIS — R1013 Epigastric pain: Secondary | ICD-10-CM | POA: Diagnosis not present

## 2024-10-01 DIAGNOSIS — E1122 Type 2 diabetes mellitus with diabetic chronic kidney disease: Secondary | ICD-10-CM | POA: Diagnosis present

## 2024-10-01 DIAGNOSIS — Z923 Personal history of irradiation: Secondary | ICD-10-CM

## 2024-10-01 DIAGNOSIS — I4891 Unspecified atrial fibrillation: Secondary | ICD-10-CM | POA: Diagnosis not present

## 2024-10-01 DIAGNOSIS — K5900 Constipation, unspecified: Secondary | ICD-10-CM | POA: Diagnosis not present

## 2024-10-01 DIAGNOSIS — I130 Hypertensive heart and chronic kidney disease with heart failure and stage 1 through stage 4 chronic kidney disease, or unspecified chronic kidney disease: Principal | ICD-10-CM | POA: Diagnosis present

## 2024-10-01 DIAGNOSIS — R739 Hyperglycemia, unspecified: Secondary | ICD-10-CM | POA: Diagnosis not present

## 2024-10-01 DIAGNOSIS — Z833 Family history of diabetes mellitus: Secondary | ICD-10-CM

## 2024-10-01 DIAGNOSIS — L89159 Pressure ulcer of sacral region, unspecified stage: Secondary | ICD-10-CM | POA: Diagnosis not present

## 2024-10-01 DIAGNOSIS — Z7901 Long term (current) use of anticoagulants: Secondary | ICD-10-CM

## 2024-10-01 DIAGNOSIS — J9601 Acute respiratory failure with hypoxia: Secondary | ICD-10-CM | POA: Diagnosis not present

## 2024-10-01 DIAGNOSIS — I5020 Unspecified systolic (congestive) heart failure: Secondary | ICD-10-CM | POA: Diagnosis not present

## 2024-10-01 DIAGNOSIS — M7989 Other specified soft tissue disorders: Secondary | ICD-10-CM | POA: Diagnosis not present

## 2024-10-01 DIAGNOSIS — E872 Acidosis, unspecified: Secondary | ICD-10-CM | POA: Diagnosis present

## 2024-10-01 DIAGNOSIS — D696 Thrombocytopenia, unspecified: Secondary | ICD-10-CM | POA: Diagnosis not present

## 2024-10-01 DIAGNOSIS — Z9911 Dependence on respirator [ventilator] status: Secondary | ICD-10-CM

## 2024-10-01 DIAGNOSIS — I4819 Other persistent atrial fibrillation: Secondary | ICD-10-CM | POA: Diagnosis present

## 2024-10-01 DIAGNOSIS — I5022 Chronic systolic (congestive) heart failure: Secondary | ICD-10-CM | POA: Diagnosis not present

## 2024-10-01 DIAGNOSIS — N1831 Chronic kidney disease, stage 3a: Secondary | ICD-10-CM | POA: Diagnosis not present

## 2024-10-01 DIAGNOSIS — I5023 Acute on chronic systolic (congestive) heart failure: Secondary | ICD-10-CM | POA: Diagnosis not present

## 2024-10-01 DIAGNOSIS — E1165 Type 2 diabetes mellitus with hyperglycemia: Secondary | ICD-10-CM | POA: Diagnosis present

## 2024-10-01 DIAGNOSIS — R5381 Other malaise: Secondary | ICD-10-CM | POA: Diagnosis not present

## 2024-10-01 DIAGNOSIS — D329 Benign neoplasm of meninges, unspecified: Secondary | ICD-10-CM | POA: Diagnosis present

## 2024-10-01 DIAGNOSIS — R197 Diarrhea, unspecified: Secondary | ICD-10-CM | POA: Diagnosis not present

## 2024-10-01 DIAGNOSIS — R319 Hematuria, unspecified: Secondary | ICD-10-CM | POA: Diagnosis not present

## 2024-10-01 DIAGNOSIS — I4892 Unspecified atrial flutter: Secondary | ICD-10-CM | POA: Diagnosis present

## 2024-10-01 DIAGNOSIS — E876 Hypokalemia: Secondary | ICD-10-CM | POA: Diagnosis not present

## 2024-10-01 DIAGNOSIS — Z4682 Encounter for fitting and adjustment of non-vascular catheter: Secondary | ICD-10-CM | POA: Diagnosis not present

## 2024-10-01 DIAGNOSIS — J9811 Atelectasis: Secondary | ICD-10-CM | POA: Diagnosis not present

## 2024-10-01 DIAGNOSIS — G4733 Obstructive sleep apnea (adult) (pediatric): Secondary | ICD-10-CM | POA: Diagnosis present

## 2024-10-01 DIAGNOSIS — H9209 Otalgia, unspecified ear: Secondary | ICD-10-CM | POA: Diagnosis not present

## 2024-10-01 DIAGNOSIS — K5901 Slow transit constipation: Secondary | ICD-10-CM | POA: Diagnosis not present

## 2024-10-01 DIAGNOSIS — Z79899 Other long term (current) drug therapy: Secondary | ICD-10-CM

## 2024-10-01 DIAGNOSIS — Z87891 Personal history of nicotine dependence: Secondary | ICD-10-CM

## 2024-10-01 DIAGNOSIS — Z452 Encounter for adjustment and management of vascular access device: Secondary | ICD-10-CM | POA: Diagnosis not present

## 2024-10-01 DIAGNOSIS — Z7984 Long term (current) use of oral hypoglycemic drugs: Secondary | ICD-10-CM

## 2024-10-01 HISTORY — PX: RIGHT HEART CATH: CATH118263

## 2024-10-01 LAB — COOXEMETRY PANEL
Carboxyhemoglobin: 2 % — ABNORMAL HIGH (ref 0.5–1.5)
Methemoglobin: <0.7 % (ref 0.0–1.5)
O2 Saturation: 64.5 %
Total hemoglobin: 15 g/dL (ref 12.0–16.0)

## 2024-10-01 LAB — POCT I-STAT EG7
Acid-Base Excess: 4 mmol/L — ABNORMAL HIGH (ref 0.0–2.0)
Bicarbonate: 28.7 mmol/L — ABNORMAL HIGH (ref 20.0–28.0)
Calcium, Ion: 1.06 mmol/L — ABNORMAL LOW (ref 1.15–1.40)
HCT: 45 % (ref 39.0–52.0)
Hemoglobin: 15.3 g/dL (ref 13.0–17.0)
O2 Saturation: 61 %
Potassium: 3.2 mmol/L — ABNORMAL LOW (ref 3.5–5.1)
Sodium: 132 mmol/L — ABNORMAL LOW (ref 135–145)
TCO2: 30 mmol/L (ref 22–32)
pCO2, Ven: 43.2 mmHg — ABNORMAL LOW (ref 44–60)
pH, Ven: 7.43 (ref 7.25–7.43)
pO2, Ven: 31 mmHg — CL (ref 32–45)

## 2024-10-01 LAB — BASIC METABOLIC PANEL WITH GFR
Anion gap: 13 (ref 5–15)
BUN: 21 mg/dL (ref 8–23)
CO2: 28 mmol/L (ref 22–32)
Calcium: 8.9 mg/dL (ref 8.9–10.3)
Chloride: 90 mmol/L — ABNORMAL LOW (ref 98–111)
Creatinine, Ser: 1.7 mg/dL — ABNORMAL HIGH (ref 0.61–1.24)
GFR, Estimated: 45 mL/min — ABNORMAL LOW (ref >60)
Glucose, Bld: 131 mg/dL — ABNORMAL HIGH (ref 70–99)
Potassium: 3.1 mmol/L — ABNORMAL LOW (ref 3.5–5.1)
Sodium: 131 mmol/L — ABNORMAL LOW (ref 135–145)

## 2024-10-01 LAB — CBC
HCT: 44 % (ref 39.0–52.0)
Hemoglobin: 15 g/dL (ref 13.0–17.0)
MCH: 31.4 pg (ref 26.0–34.0)
MCHC: 34.1 g/dL (ref 30.0–36.0)
MCV: 92.1 fL (ref 80.0–100.0)
Platelets: 196 K/uL (ref 150–400)
RBC: 4.78 MIL/uL (ref 4.22–5.81)
RDW: 16.4 % — ABNORMAL HIGH (ref 11.5–15.5)
WBC: 7.8 K/uL (ref 4.0–10.5)
nRBC: 0 % (ref 0.0–0.2)

## 2024-10-01 LAB — ECHOCARDIOGRAM COMPLETE
Area-P 1/2: 5.62 cm2
Calc EF: 19.5 %
Est EF: <20
Height: 78 in
S' Lateral: 6.4 cm
Single Plane A2C EF: 17.4 %
Single Plane A4C EF: 20.9 %
Weight: 3680 [oz_av]

## 2024-10-01 LAB — PRO BRAIN NATRIURETIC PEPTIDE: Pro Brain Natriuretic Peptide: 8374 pg/mL — ABNORMAL HIGH (ref <300.0)

## 2024-10-01 LAB — DIGOXIN LEVEL: Digoxin Level: 1.7 ng/mL (ref 0.8–2.0)

## 2024-10-01 LAB — GLUCOSE, CAPILLARY: Glucose-Capillary: 134 mg/dL — ABNORMAL HIGH (ref 70–99)

## 2024-10-01 LAB — MRSA NEXT GEN BY PCR, NASAL: MRSA by PCR Next Gen: NOT DETECTED

## 2024-10-01 LAB — TSH: TSH: 1.08 u[IU]/mL (ref 0.350–4.500)

## 2024-10-01 LAB — MAGNESIUM: Magnesium: 1.5 mg/dL — ABNORMAL LOW (ref 1.7–2.4)

## 2024-10-01 MED ORDER — DIGOXIN 125 MCG PO TABS
0.1250 mg | ORAL_TABLET | Freq: Every day | ORAL | Status: DC
  Administered 2024-10-01: 10:00:00 0.125 mg via ORAL
  Filled 2024-10-01 (×2): qty 1

## 2024-10-01 MED ORDER — FENTANYL CITRATE (PF) 100 MCG/2ML IJ SOLN
INTRAMUSCULAR | Status: AC
  Filled 2024-10-01: qty 2

## 2024-10-01 MED ORDER — AMIODARONE HCL 200 MG PO TABS
200.0000 mg | ORAL_TABLET | Freq: Two times a day (BID) | ORAL | Status: DC
  Administered 2024-10-01: 22:00:00 200 mg via ORAL
  Filled 2024-10-01 (×2): qty 1

## 2024-10-01 MED ORDER — HEPARIN (PORCINE) 25000 UT/250ML-% IV SOLN
1550.0000 [IU]/h | INTRAVENOUS | Status: DC
  Administered 2024-10-01: 22:00:00 1400 [IU]/h via INTRAVENOUS
  Administered 2024-10-02: 1600 [IU]/h via INTRAVENOUS
  Administered 2024-10-03: 1750 [IU]/h via INTRAVENOUS
  Administered 2024-10-03: 1700 [IU]/h via INTRAVENOUS
  Administered 2024-10-04: 1500 [IU]/h via INTRAVENOUS
  Administered 2024-10-05: 1550 [IU]/h via INTRAVENOUS
  Administered 2024-10-05: 1500 [IU]/h via INTRAVENOUS
  Filled 2024-10-01 (×7): qty 250

## 2024-10-01 MED ORDER — ASPIRIN 81 MG PO CHEW: 81.0000 mg | CHEWABLE_TABLET | ORAL | Status: DC

## 2024-10-01 MED ORDER — MILRINONE LACTATE IN DEXTROSE 20-5 MG/100ML-% IV SOLN
0.3750 ug/kg/min | INTRAVENOUS | Status: DC
  Administered 2024-10-01: 18:00:00 0.5 ug/kg/min via INTRAVENOUS
  Administered 2024-10-01: 11:00:00 0.375 ug/kg/min via INTRAVENOUS
  Administered 2024-10-01 – 2024-10-06 (×18): 0.5 ug/kg/min via INTRAVENOUS
  Administered 2024-10-06: 0.25 ug/kg/min via INTRAVENOUS
  Administered 2024-10-07 – 2024-10-13 (×22): 0.5 ug/kg/min via INTRAVENOUS
  Administered 2024-10-13: 0.375 ug/kg/min via INTRAVENOUS
  Administered 2024-10-13: 0.5 ug/kg/min via INTRAVENOUS
  Filled 2024-10-01 (×14): qty 100
  Filled 2024-10-01: qty 200
  Filled 2024-10-01 (×6): qty 100
  Filled 2024-10-01: qty 200
  Filled 2024-10-01 (×6): qty 100
  Filled 2024-10-01: qty 200
  Filled 2024-10-01 (×10): qty 100

## 2024-10-01 MED ORDER — APIXABAN 5 MG PO TABS
ORAL_TABLET | ORAL | Status: AC
  Filled 2024-10-01: qty 1

## 2024-10-01 MED ORDER — PERFLUTREN LIPID MICROSPHERE
1.0000 mL | INTRAVENOUS | Status: AC | PRN
  Administered 2024-10-01: 16:00:00 2 mL via INTRAVENOUS

## 2024-10-01 MED ORDER — SODIUM CHLORIDE 0.9% FLUSH
3.0000 mL | Freq: Two times a day (BID) | INTRAVENOUS | Status: DC
  Administered 2024-10-01 – 2024-10-13 (×21): 3 mL via INTRAVENOUS

## 2024-10-01 MED ORDER — MIDAZOLAM HCL 2 MG/2ML IJ SOLN
INTRAMUSCULAR | Status: AC
  Filled 2024-10-01: qty 2

## 2024-10-01 MED ORDER — ACETAMINOPHEN 325 MG PO TABS
650.0000 mg | ORAL_TABLET | ORAL | Status: DC | PRN
  Administered 2024-10-03 – 2024-10-07 (×2): 650 mg via ORAL
  Filled 2024-10-01 (×2): qty 2

## 2024-10-01 MED ORDER — APIXABAN 5 MG PO TABS
5.0000 mg | ORAL_TABLET | Freq: Two times a day (BID) | ORAL | Status: DC
  Administered 2024-10-01: 10:00:00 5 mg via ORAL

## 2024-10-01 MED ORDER — SODIUM CHLORIDE 0.9 % IV SOLN: 250.0000 mL | INTRAVENOUS | Status: AC | PRN

## 2024-10-01 MED ORDER — FUROSEMIDE 10 MG/ML IJ SOLN
80.0000 mg | Freq: Two times a day (BID) | INTRAMUSCULAR | Status: AC
  Administered 2024-10-01 (×2): 80 mg via INTRAVENOUS
  Filled 2024-10-01: qty 8

## 2024-10-01 MED ORDER — HEPARIN (PORCINE) IN NACL 2000-0.9 UNIT/L-% IV SOLN
INTRAVENOUS | Status: DC | PRN
  Administered 2024-10-01: 08:00:00 1000 mL

## 2024-10-01 MED ORDER — POTASSIUM CHLORIDE 20 MEQ PO PACK
40.0000 meq | PACK | Freq: Once | ORAL | Status: AC
  Administered 2024-10-01: 12:00:00 40 meq via ORAL
  Filled 2024-10-01: qty 2

## 2024-10-01 MED ORDER — ATORVASTATIN CALCIUM 40 MG PO TABS
40.0000 mg | ORAL_TABLET | Freq: Every day | ORAL | Status: DC
  Administered 2024-10-01 – 2024-10-13 (×13): 40 mg via ORAL
  Filled 2024-10-01 (×11): qty 1

## 2024-10-01 MED ORDER — SODIUM CHLORIDE 0.9 % IV SOLN: 250.0000 mL | INTRAVENOUS | Status: DC | PRN

## 2024-10-01 MED ORDER — MIDAZOLAM HCL (PF) 2 MG/2ML IJ SOLN
INTRAMUSCULAR | Status: DC | PRN
  Administered 2024-10-01: 08:00:00 1 mg via INTRAVENOUS

## 2024-10-01 MED ORDER — SACUBITRIL-VALSARTAN 24-26 MG PO TABS
1.0000 | ORAL_TABLET | Freq: Two times a day (BID) | ORAL | Status: DC
  Administered 2024-10-01 (×2): 1 via ORAL
  Filled 2024-10-01 (×3): qty 1

## 2024-10-01 MED ORDER — MEXILETINE HCL 200 MG PO CAPS
200.0000 mg | ORAL_CAPSULE | Freq: Three times a day (TID) | ORAL | Status: DC
  Administered 2024-10-01 – 2024-10-14 (×40): 200 mg via ORAL
  Filled 2024-10-01 (×33): qty 1

## 2024-10-01 MED ORDER — SPIRONOLACTONE 25 MG PO TABS
25.0000 mg | ORAL_TABLET | Freq: Every day | ORAL | Status: DC
  Administered 2024-10-01 – 2024-10-13 (×13): 25 mg via ORAL
  Filled 2024-10-01 (×11): qty 1

## 2024-10-01 MED ORDER — ACETAMINOPHEN 500 MG PO TABS: 1000.0000 mg | ORAL_TABLET | Freq: Four times a day (QID) | ORAL | Status: DC | PRN

## 2024-10-01 MED ORDER — SODIUM CHLORIDE 0.9% FLUSH: 3.0000 mL | Freq: Two times a day (BID) | INTRAVENOUS | Status: DC

## 2024-10-01 MED ORDER — ONDANSETRON HCL 4 MG PO TABS: 4.0000 mg | ORAL_TABLET | Freq: Three times a day (TID) | ORAL | Status: DC | PRN

## 2024-10-01 MED ORDER — ONDANSETRON HCL 4 MG/2ML IJ SOLN
4.0000 mg | Freq: Four times a day (QID) | INTRAMUSCULAR | Status: DC | PRN
  Administered 2024-10-04 – 2024-10-13 (×2): 4 mg via INTRAVENOUS
  Filled 2024-10-01: qty 2

## 2024-10-01 MED ORDER — SODIUM CHLORIDE 0.9% FLUSH: 3.0000 mL | INTRAVENOUS | Status: DC | PRN

## 2024-10-01 MED ORDER — FENTANYL CITRATE (PF) 50 MCG/ML IJ SOSY
50.0000 ug | PREFILLED_SYRINGE | INTRAMUSCULAR | Status: DC | PRN
  Administered 2024-10-01: 18:00:00 50 ug via INTRAVENOUS
  Filled 2024-10-01: qty 1

## 2024-10-01 MED ORDER — HYDRALAZINE HCL 50 MG PO TABS
50.0000 mg | ORAL_TABLET | Freq: Three times a day (TID) | ORAL | Status: DC
  Administered 2024-10-01 – 2024-10-02 (×4): 50 mg via ORAL
  Filled 2024-10-01 (×4): qty 1

## 2024-10-01 MED ORDER — FENTANYL CITRATE (PF) 100 MCG/2ML IJ SOLN
INTRAMUSCULAR | Status: DC | PRN
  Administered 2024-10-01: 08:00:00 25 ug via INTRAVENOUS

## 2024-10-01 MED ORDER — MILRINONE LACTATE IN DEXTROSE 20-5 MG/100ML-% IV SOLN: 0.3750 ug/kg/min | INTRAVENOUS | Status: DC

## 2024-10-01 MED ORDER — AMIODARONE HCL 200 MG PO TABS
200.0000 mg | ORAL_TABLET | Freq: Every day | ORAL | Status: DC
  Administered 2024-10-01: 10:00:00 200 mg via ORAL
  Filled 2024-10-01: qty 1

## 2024-10-01 MED ORDER — POTASSIUM CHLORIDE CRYS ER 20 MEQ PO TBCR
40.0000 meq | EXTENDED_RELEASE_TABLET | Freq: Once | ORAL | Status: DC
  Filled 2024-10-01: qty 2

## 2024-10-01 MED ORDER — LIDOCAINE HCL (PF) 1 % IJ SOLN
INTRAMUSCULAR | Status: DC | PRN
  Administered 2024-10-01: 08:00:00 5 mL via INTRADERMAL

## 2024-10-01 MED ORDER — MAGNESIUM SULFATE 4 GM/100ML IV SOLN
4.0000 g | Freq: Once | INTRAVENOUS | Status: AC
  Administered 2024-10-01: 14:00:00 4 g via INTRAVENOUS
  Filled 2024-10-01: qty 100

## 2024-10-01 MED ORDER — LIDOCAINE HCL (PF) 1 % IJ SOLN
INTRAMUSCULAR | Status: AC
  Filled 2024-10-01: qty 30

## 2024-10-01 MED ADMIN — Potassium Chloride Microencapsulated Crys ER Tab 20 mEq: 40 meq | ORAL | @ 07:00:00 | NDC 00245531989

## 2024-10-01 MED ADMIN — Water For Irrigation, Sterile Irrigation Soln: 250 mL | ORAL | @ 07:00:00 | NDC 99999080061

## 2024-10-01 MED FILL — Furosemide Inj 10 MG/ML: INTRAMUSCULAR | Qty: 8 | Status: AC

## 2024-10-01 MED FILL — Potassium Chloride Microencapsulated Crys ER Tab 20 mEq: 40.0000 meq | ORAL | Qty: 2 | Status: AC

## 2024-10-01 NOTE — Interval H&P Note (Signed)
 History and Physical Interval Note:  10/01/2024 8:12 AM  James [name]  has presented today for surgery, with the diagnosis of Chronic congestive heart failure.  The various methods of treatment have been discussed with the patient and family. After consideration of risks, benefits and other options for treatment, the patient has consented to  Procedures: RIGHT HEART CATH (N/A) as a surgical intervention.  The patient's history has been reviewed, patient examined, no change in status, stable for surgery.  I have reviewed the patient's chart and labs.  Questions were answered to the patient's satisfaction.     Leah Thornberry [employer]

## 2024-10-01 NOTE — Progress Notes (Signed)
2D echo complete 

## 2024-10-01 NOTE — TOC Initial Note (Addendum)
 Transition of Care [facility]) - Initial/Assessment Note    Patient Details  Name: James [name] MRN: [id number] Date of Birth: Dec 23, 1960  Transition of [facility]) CM/SW Contact:    Justina Delcia Czar, RN Phone Number: [phone] 10/01/2024, 1:22 PM  Clinical Narrative:                 Spoke to pt and family at bedside. Pt states he is independent pta. He currently on FMLA/STD with his job. Pt is VAD workup.   Pt on Home Milrinone  with [facility]  Has scale at home for daily weights. Has Living Better with HF booklet at home.   Chart reviewed for discharge readiness, patient not medically stable for d/c. Inpatient CM/CSW will continue to monitor pt's advancement through interdisciplinary progression rounds.    If new pt transition needs arise, MD please place a TOC consult.    Expected Discharge Plan: IP Rehab Facility Barriers to Discharge: Continued Medical Work up   Patient Goals and CMS Choice Patient states their goals for this hospitalization and ongoing recovery are:: wants to recover CMS Medicare.gov Compare Post Acute Care list provided to:: Patient Choice offered to / list presented to : Patient      Expected Discharge Plan and Services   Discharge Planning Services: CM Consult Post Acute Care Choice: Home Health Living arrangements for the past 2 months: Single Family Home                                      Prior Living Arrangements/Services Living arrangements for the past 2 months: Single Family Home Lives with:: Spouse Patient language and need for interpreter reviewed:: Yes Do you feel safe going back to the place where you live?: Yes      Need for Family Participation in Patient Care: No (Comment) Care giver support system in place?: Yes (comment) Current home services: DME (scale) Criminal Activity/Legal Involvement Pertinent to Current Situation/Hospitalization: No - Comment as needed  Activities of Daily Living      Permission  Sought/Granted Permission sought to share information with : Case Manager, Family Supports, PCP Permission granted to share information with : Yes, Verbal Permission Granted  Share Information with NAME: Toussaint Golson  Permission granted to share info w AGENCY: Home health, DME, PCP  Permission granted to share info w Relationship: wife  Permission granted to share info w Contact Information: [phone]  Emotional Assessment Appearance:: Appears stated age Attitude/Demeanor/Rapport: Engaged Affect (typically observed): Accepting Orientation: : Oriented to Self, Oriented to Place, Oriented to  Time, Oriented to Situation   Psych Involvement: No (comment)  Admission diagnosis:  CHF (congestive heart failure) (HCC) [I50.9] Patient Active Problem List   Diagnosis Date Noted   CHF (congestive heart failure) (HCC) 10/01/2024   Acute renal failure superimposed on chronic kidney disease 07/09/2024   Elevated troponin 07/09/2024   Mass of brain 03/24/2024   Vitamin D  deficiency 12/02/2023   Malignant neoplasm of prostate (HCC) 11/17/2023   Paroxysmal atrial fibrillation (HCC) 10/09/2023   Bilateral lower extremity edema 09/25/2023   Vertigo 08/29/2023   Elevated PSA 06/13/2023   Abnormal echocardiogram 08/24/2022   Stage 3a chronic kidney disease (HCC) 08/24/2022   HFrEF (heart failure with reduced ejection fraction) (HCC) 08/23/2022   Asthma 07/30/2022   Encounter to establish care 07/30/2022   Acute congestive heart failure (HCC) 07/22/2022   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  NSVT (nonsustained ventricular tachycardia) 07/21/2021   Pulmonary hypertension, unspecified (HCC) 07/21/2021   Tobacco abuse 07/21/2021   Diabetes mellitus type 2 in nonobese (HCC) 07/21/2021   Acute on chronic combined systolic and diastolic congestive heart failure (HCC)    Non-recurrent unilateral inguinal hernia without obstruction or gangrene    Perforation bowel (HCC) 06/19/2014   S/P colonoscopy with  polypectomy 06/18/2014   Hyperlipidemia 11/04/2012   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 & [address]  [place] [address] Phone: [phone] Fax: [phone]     Social Drivers of Health (SDOH) Social History: SDOH Screenings   Food Insecurity: No Food Insecurity (07/21/2024)  Housing: Low Risk (07/21/2024)  Transportation Needs: No Transportation Needs (07/21/2024)  Utilities: Not At Risk (07/21/2024)  Alcohol Screen: Low Risk (11/18/2023)  Depression (PHQ2-9): Low Risk (08/21/2024)  Tobacco Use: Medium Risk (09/17/2024)   SDOH Interventions:     Readmission Risk Interventions    07/10/2024   11:05 AM 10/10/2023   10:37 AM  Readmission Risk Prevention Plan  Transportation Screening Complete Complete  Home Care Screening Complete Complete  Medication Review (RN CM) Complete Complete

## 2024-10-01 NOTE — Progress Notes (Signed)
 Heart Failure Navigator Progress Note  Assessed for Heart & Vascular TOC clinic readiness.  Patient does not meet criteria due to he is an Advanced Heart Failure Team patient of Dr. Zenaida. .   Navigator will sign off at this time.   Stephane Haddock, BSN, Scientist, clinical (histocompatibility and immunogenetics) Only

## 2024-10-01 NOTE — Progress Notes (Signed)
 TD CO/CI 4.7/1.98 2 hrs after increasing milrinone  to 0.375 mcg/kg/min.   PA 30/20, suspect inaccurate. Waveform not great. PA pressures much higher in the cath lab.   Reviewed the above with Dr. Rolan.  Increase milrinone  to 0.5 mcg/kg/min. Check co-ox. CXR to confirm [facility] placement.   Manuelita Dutch, PA-C Advanced Heart Failure

## 2024-10-01 NOTE — Progress Notes (Signed)
 CPT code 66020 approved Authorization # [id number]

## 2024-10-01 NOTE — Progress Notes (Signed)
 Patient came from home w/Milrinone  infusing to rt upper chest port. Pharmacy notified of this and that Dr. Rolan ordered Milrinone  to be started. Maurilio, pharmacist, is here.

## 2024-10-01 NOTE — H&P (Addendum)
 Advanced Heart Failure Team History and Physical Note   PCP:  Bevely Doffing, FNP  PCP-Cardiology: Maude Emmer, MD    HF Cardiologist: Dr. Zenaida  Reason for Admission: Chronic systolic heart failure/LVAD implant HPI:    James [name] is a 63 y.o. male with HFrEF/nonischemic cardiomyopathy s/p ICD, HTN, T2DM, VT, aortic root aneurysm, prostate cancer s/p EBRT and ADT, meningioma. HF dates back to 2011 when he was diagnosed with nonischemic cardiomyopathy.     CPX 3/24  suggestive of severe heart failure limitation and multiple right heart catheterizations demonstrate persistently reduced cardiac index less than 2 L/min/m.     Completed radiation for prostate cancer April 2025. He was also referred to Neurology for mass noted on CT of head.    Echo 7/25 showed EF  25-30%, G3DD, RV mildly reduced, moderate MR  Admitted 07/09/24 with cardiogenic shock. Previous admission within the last year for similar decompensation and was worked of for transplant/LVAD in 2024. Presented with lactic acidosis, AKI, and shock liver. Echo showed EF 10-15% with mod reduced RV. He was started on Milrinone  and aggressively diuresed with improvement of AKI and liver function. Failed attempt to wean from milrinone . Course further complicated by high PVC and NSVT burden, which has been decreased with mexiletine and amiodarone .  Initial plan was to follow-up with [facility] for transplant. After discussion with family, patient decided he would prefer to consider LVAD. His wife had a relative who suffered a poor outcome following transplant. Also concerned about potential waiting period for transplant. He was seen in VAD clinic, met with TCTS surgeon, Dr. Daniel, and discussed at [facility]. He was felt to be a reasonable candidate for LVAD. He is being admitted for pre-VAD optimization prior to planned surgery on 10/06/24.   Home Medications Prior to Admission medications  Medication Sig Start Date End Date Taking?  Authorizing Provider  albuterol  (VENTOLIN  HFA) 108 (90 Base) MCG/ACT inhaler INHALE 2 PUFFS BY MOUTH FOUR TIMES DAILY AS NEEDED 08/21/23  Yes Melvenia Manus BRAVO, MD  amiodarone  (PACERONE ) 200 MG tablet Take 1 tablet (200 mg total) by mouth daily. 08/12/24  Yes Hayes Beckey CROME, NP  atorvastatin  (LIPITOR) 40 MG tablet Take 1 tablet (40 mg total) by mouth daily. 03/24/24  Yes Melvenia Manus BRAVO, MD  digoxin  (LANOXIN ) 0.125 MG tablet Take 1 tablet (0.125 mg total) by mouth daily. 08/12/24  Yes Diaz, Alma L, NP  FARXIGA  10 MG TABS tablet TAKE 1 TABLET(10 MG) BY MOUTH DAILY BEFORE BREAKFAST 03/30/24  Yes Clegg, Amy D, NP  hydrALAZINE  (APRESOLINE ) 50 MG tablet Take 1 tablet (50 mg total) by mouth every 8 (eight) hours. 08/12/24  Yes Hayes Beckey CROME, NP  metFORMIN (GLUCOPHAGE) 500 MG tablet TAKE 1 TABLET BY MOUTH TWICE DAILY 05/04/24  Yes Bevely Doffing, FNP  mexiletine (MEXITIL ) 200 MG capsule Take 1 capsule (200 mg total) by mouth every 8 (eight) hours. 08/12/24  Yes Hayes Beckey CROME, NP  milrinone  (PRIMACOR ) 20 MG/100 ML SOLN infusion Inject 0.0294 mg/min into the vein continuous. 07/17/24  Yes Marcine Catalan M, PA-C  spironolactone  (ALDACTONE ) 25 MG tablet Take 1 tablet (25 mg total) by mouth at bedtime. 01/07/24  Yes Milford, Harlene HERO, FNP  torsemide  (DEMADEX ) 20 MG tablet Take 1 tablet (20 mg total) by mouth daily. 08/12/24  Yes Hayes Beckey CROME, NP  acetaminophen  (TYLENOL ) 500 MG tablet Take 1,000 mg by mouth every 6 (six) hours as needed for moderate pain.    [provider]  apixaban  (  ELIQUIS ) 5 MG TABS tablet Take 1 tablet (5 mg total) by mouth 2 (two) times daily. 12/13/23   Waddell Danelle ORN, MD  ondansetron  (ZOFRAN ) 4 MG tablet Take 1 tablet (4 mg total) by mouth every 8 (eight) hours as needed for nausea or vomiting. 09/15/24   Zenaida Morene PARAS, MD  potassium chloride  SA (KLOR-CON  M) 20 MEQ tablet Take 1 tablet (20 mEq total) by mouth daily. 08/12/24   Hayes Beckey CROME, NP  sacubitril -valsartan  (ENTRESTO )  24-26 MG Take 1 tablet by mouth 2 (two) times daily. 07/23/24   Colletta Manuelita Garre, PA-C    Past Medical History: Past Medical History:  Diagnosis Date   CKD (chronic kidney disease) stage 2, GFR 60-89 ml/min    COPD (chronic obstructive pulmonary disease) (HCC)    Diabetes mellitus type II, non insulin  dependent (HCC)    Dilated aortic root    Elevated PSA    Hypercholesteremia    Hypertension    NICM (nonischemic cardiomyopathy) (HCC)    NSVT (nonsustained ventricular tachycardia) (HCC)    Obstructive sleep apnea    Paroxysmal atrial fibrillation (HCC)    Pulmonary hypertension (HCC)    PVC's (premature ventricular contractions)    Tobacco abuse     Past Surgical History: Past Surgical History:  Procedure Laterality Date   CARDIAC CATHETERIZATION  10/15/2000   COLONOSCOPY N/A 03/22/2014   Procedure: COLONOSCOPY;  Surgeon: Margo CROME Haddock, MD;  Location: [facility] END[facility] SUITE;  Service: Endoscopy;  Laterality: N/A;  11:15 AM   ICD IMPLANT N/A 08/21/2021   Procedure: ICD IMPLANT;  Surgeon: Waddell Danelle ORN, MD;  Location: [facility] INVASIVE CV LAB;  Service: Cardiovascular;  Laterality: N/A;   INGUINAL HERNIA REPAIR Right 06/24/2017   Procedure: HERNIA REPAIR INGUINAL ADULT WITH MESH;  Surgeon: Mavis Anes, MD;  Location: [facility] ORS;  Service: General;  Laterality: Right;   IR TUNNELED CENTRAL VENOUS CATH PLC W IMG  07/17/2024   PROSTATE BIOPSY     RIGHT HEART CATH N/A 11/13/2022   Procedure: RIGHT HEART CATH;  Surgeon: Gardenia Led, DO;  Location: [facility] INVASIVE CV LAB;  Service: Cardiovascular;  Laterality: N/A;   RIGHT HEART CATH N/A 12/21/2022   Procedure: RIGHT HEART CATH;  Surgeon: Gardenia Led, DO;  Location: [facility] INVASIVE CV LAB;  Service: Cardiovascular;  Laterality: N/A;   RIGHT HEART CATH N/A 11/25/2023   Procedure: RIGHT HEART CATH;  Surgeon: Gardenia Led, DO;  Location: [facility] INVASIVE CV LAB;  Service: Cardiovascular;  Laterality: N/A;   RIGHT/LEFT HEART CATH AND CORONARY  ANGIOGRAPHY N/A 07/19/2021   Procedure: RIGHT/LEFT HEART CATH AND CORONARY ANGIOGRAPHY;  Surgeon: Verlin Lonni BIRCH, MD;  Location: [facility] INVASIVE CV LAB;  Service: Cardiovascular;  Laterality: N/A;   TEE WITHOUT CARDIOVERSION N/A 12/21/2022   Procedure: TRANSESOPHAGEAL ECHOCARDIOGRAM (TEE);  Surgeon: Gardenia Led, DO;  Location: [facility] ENDOSCOPY;  Service: Cardiovascular;  Laterality: N/A;    Family History:  Family History  Problem Relation Age of Onset   Coronary artery disease Mother    Diabetes Father    Heart attack Father    Hypertension Father    Iron deficiency Father    Thyroid  disease Sister    Cardiomyopathy Brother        No significant coronary disease by coronary angiography   Heart disease Maternal Grandfather        Also paternal grandfather   Sudden death Other    Colon cancer Neg Hx     Social History: Social History   Socioeconomic History  Marital status: Married    Spouse name: Mycal Conde   Number of children: 4   Years of education: Not on file   Highest education level: Associate degree: occupational, scientist, product/process development, or vocational program  Occupational History   Occupation: merchandiser, retail    Comment: [employer]  Tobacco Use   Smoking status: Former    Current packs/day: 0.00    Average packs/day: 0.5 packs/day for 38.8 years (19.4 ttl pk-yrs)    Types: Cigarettes    Start date: 10/15/1982    Quit date: 07/18/2021    Years since quitting: 3.2   Smokeless tobacco: Never   Tobacco comments:    3 cigaretts a day  Vaping Use   Vaping status: Never Used  Substance and Sexual Activity   Alcohol use: No    Alcohol/week: 0.0 standard drinks of alcohol   Drug use: No   Sexual activity: Yes    Birth control/protection: None  Other Topics Concern   Not on file  Social History Narrative   Divorced   No regular exercise   Social Drivers of Health   Tobacco Use: Medium Risk (09/17/2024)   Patient History    Smoking Tobacco Use: Former     Smokeless Tobacco Use: Never    Passive Exposure: Not on Actuary Strain: Not on file  Food Insecurity: No Food Insecurity (07/21/2024)   Epic    Worried About Programme Researcher, Broadcasting/film/video in the Last Year: Never true    Ran Out of Food in the Last Year: Never true  Transportation Needs: No Transportation Needs (07/21/2024)   Epic    Lack of Transportation (Medical): No    Lack of Transportation (Non-Medical): No  Physical Activity: Not on file  Stress: Not on file  Social Connections: Not on file  Depression (PHQ2-9): Low Risk (08/21/2024)   Depression (PHQ2-9)    PHQ-2 Score: 0  Alcohol Screen: Low Risk (11/18/2023)   Alcohol Screen    Last Alcohol Screening Score (AUDIT): 0  Housing: Low Risk (07/21/2024)   Epic    Unable to Pay for Housing in the Last Year: No    Number of Times Moved in the Last Year: 0    Homeless in the Last Year: No  Utilities: Not At Risk (07/21/2024)   Epic    Threatened with loss of utilities: No  Health Literacy: Not on file    Allergies:  Allergies[1]  Objective:    Vital Signs:   Temp:  [97.6 F (36.4 C)] 97.6 F (36.4 C) (12/18 0607) Pulse Rate:  [0-82] 67 (12/18 0905) Resp:  [12-22] 15 (12/18 0905) BP: (103-133)/(76-98) 117/76 (12/18 0905) SpO2:  [88 %-100 %] 97 % (12/18 0905) Weight:  [104.3 kg] 104.3 kg (12/18 0607)   Filed Weights   10/01/24 0607  Weight: 104.3 kg    Physical Exam    General:  Well appearing.   Cor: JVP 10 cm. R internal jugular swan. Regular rate & rhythm. No murmurs.  Lungs: clear Extremities: no edema   Telemetry   SR 60s  Labs  Basic Metabolic Panel: Recent Labs  Lab 10/01/24 0604  NA 131*  K 3.1*  CL 90*  CO2 28  GLUCOSE 131*  BUN 21  CREATININE 1.70*  CALCIUM  8.9    Liver Function Tests: No results for input(s): AST, ALT, ALKPHOS, BILITOT, PROT, ALBUMIN in the last 168 hours. No results for input(s): LIPASE, AMYLASE in the last 168 hours. No results for input(s):  AMMONIA  in the last 168 hours.  CBC: Recent Labs  Lab 10/01/24 0604  WBC 7.8  HGB 15.0  HCT 44.0  MCV 92.1  PLT 196    Cardiac Enzymes: No results for input(s): CKTOTAL, CKMB, CKMBINDEX, TROPONINI in the last 168 hours.  BNP: BNP (last 3 results) Recent Labs    06/10/24 1213 07/09/24 1649 07/23/24 0959  BNP 1,071.5* 3,316.0* 595.8*    ProBNP (last 3 results) No results for input(s): PROBNP in the last 8760 hours.   CBG: Recent Labs  Lab 10/01/24 0607  GLUCAP 134*    Coagulation Studies: No results for input(s): LABPROT, INR in the last 72 hours.  Imaging: CARDIAC CATHETERIZATION Result Date: 10/01/2024 1. Elevated left and right heart filling pressures. 2. Low CO despite milrinone  0.25 3. Moderate-severe mixed pulmonary arterial/pulmonary venous hypertension.    Patient Profile   James [name] is a 63 y.o. male with history of stage D cardiomyopathy/chronic systolic heart failure now end-stage w/ inotrope dependence, CKD IIIa, hx VT, PAF, prostate cancer, meningioma.   Admitted for LVAD implant.  Assessment/Plan   Chronic systolic heart failure: Long history of nonischemic cardiomyopathy, now stage D. Previously symptoms were incongruent with low output on right heart cath and CPX.  Admitted with recurrent cardiogenic shock, currently on home milrinone  at 0.25.  Reasonable candidate for both LVAD and txp, has elected to move forward to LVAD. LVAD implant scheduled for 10/06/24 with Dr. Daniel. - RHC 10/01/24: RA mean 10, PA 69/31 (45), PCWP mean 25, Fick CO/CI 4.73/1.98, TD CO/CI 4.28/1.79, PVR Fick 4.2 WU and TD 4.7 WU. - Increase milrinone  to 0.375 mcg/kg/min - Follow hemodynamics on Swan - Start IV lasix  80 BID - Continue digoxin  0.125 - Continue hydralazine  50mg  TID - Continue Entresto  24/26 mg BID - Continue spiro 25 mg daily - Hold Farxiga  pre-op   CKD Stage IIIa: Scr elevated to 2.3 during admission 9/25, improved with inotropes  and diuresis. Recent baseline seems to be 1.4-1.8. - Continue to follow   NSVT: - Continue amiodarone  200mg  daily - Biotronik ICD in place - Continue mexitil  200mg  TID   Atrial fibrillation: Paroxysmal. SR today - Continue Eliquis . Switch to heparin  closer to surgical date, likely 12/21   Prostate cancer: - completed EBRT 4/25. Completed ST ADT.    Meningioma.  - Noted on CT 5/25, seen neurosx. CT scan during recent admission w/ stable size.    FINCH, LINDSAY N, PA-C 10/01/2024, 9:15 AM  Advanced Heart Failure Team Pager [phone] (M-F; 7a - 5p)   Please visit [email or web address]: For overnight coverage please call cardiology fellow first. If fellow not available call Shock/ECMO MD on call.  For ECMO / Mechanical Support (Impella, IABP, LVAD) issues call Shock / ECMO MD on call.    Patient seen with PA, I formulated the plan and agree with the above note.   Patient was brought in today for optimization prior to LVAD placement.    RHC was done on milrinone  0.25: RA mean 10 RV 77/11 PA 69/31, mean 45 PCWP mean 25 Oxygen  saturations: PA 62% AO 95% Cardiac Output (Fick) 4.73  Cardiac Index (Fick) 1.98  PVR 4.2 WU Cardiac Output (Thermo) 4.28  Cardiac Index (Thermo) 1.79  PVR 4.7 WU   General: NAD Neck: JVP 10-12 cm, no thyromegaly or thyroid  nodule.  Lungs: Clear to auscultation bilaterally with normal respiratory effort.No carotid bruit.  Normal pedal pulses.  Abdomen: Soft, nontender, no hepatosplenomegaly, no distention.  Skin: Intact without lesions or rashes.  Neurologic: Alert and oriented x 3.  Psych: Normal affect. Extremities: No clubbing or cyanosis.  HEENT: Normal.   1. Acute on chronic systolic CHF: Nonischemic cardiomyopathy.  Last echo in 9/25 with EF 10-15%, severe LV dilation, mild RVE with mild-moderate RV dilation, moderate MR, moderate TR, dilated IVC. H/o low output HF, has been on home milrinone  0.25.  Extensive discussion recently re: transplant vs  LVAD.  Finally, it appears that patient has settled on LVAD as his preference.  He was brought in for hemodynamic optimization prior to LVAD.  RHC today showed elevated filling pressures with mod-severe mixed pulmonary arterial/pulmonary venous hypertension and low CO despite milrinone  0.25.  - Increase milrinone  to 0.375, recheck CO.  - Will follow closely, may need Impella 5.5 vs IABP pre-op.  - Lasix  80 mg IV bid today.  - Continue Entresto  24/26 bid - Continue hydralazine  - Continue spironolactone  25 daily - Hold Farxiga  pre-cardiac surgery.  - Continue digoxin  0.125.  - Tentatively scheduled for LVAD on 12/23.  2. Pulmonary hypertension: Moderate-severe mixed pulmonary arterial/pulmonary venous hypertension on RHC today.  - Increasing milrinone .  - Diurese as above.  - When he is more euvolemic, can add sildenafil  20 tid.  3.  CKD stage 3: Creatinine 1.7, follow closely with diuresis.  4.  PVCs/NSVT: Has Biotronik ICD.   - Increase amiodarone  to 200 bid as increasing milrinone  and has frequent ectopy.  - Continue mexiletine.  5.  Atrial fibrillation: Paroxysmal.  NSR today.  - Continue amiodarone  as above.  - On Eliquis , switch to heparin  gtt closer to surgery.  6. Meningioma: Monitoring.  7. Prostate cancer: Treated with radiation.   James [name] 10/01/2024 11:27 AM     [1] No Known Allergies

## 2024-10-02 ENCOUNTER — Inpatient Hospital Stay (HOSPITAL_COMMUNITY)

## 2024-10-02 DIAGNOSIS — I272 Pulmonary hypertension, unspecified: Secondary | ICD-10-CM | POA: Diagnosis not present

## 2024-10-02 DIAGNOSIS — I5023 Acute on chronic systolic (congestive) heart failure: Secondary | ICD-10-CM | POA: Diagnosis not present

## 2024-10-02 DIAGNOSIS — R570 Cardiogenic shock: Secondary | ICD-10-CM | POA: Diagnosis not present

## 2024-10-02 DIAGNOSIS — I428 Other cardiomyopathies: Secondary | ICD-10-CM | POA: Diagnosis not present

## 2024-10-02 LAB — POCT I-STAT EG7
Acid-Base Excess: 4 mmol/L — ABNORMAL HIGH (ref 0.0–2.0)
Bicarbonate: 29.4 mmol/L — ABNORMAL HIGH (ref 20.0–28.0)
Calcium, Ion: 1.07 mmol/L — ABNORMAL LOW (ref 1.15–1.40)
HCT: 46 % (ref 39.0–52.0)
Hemoglobin: 15.6 g/dL (ref 13.0–17.0)
O2 Saturation: 62 %
Potassium: 3.2 mmol/L — ABNORMAL LOW (ref 3.5–5.1)
Sodium: 132 mmol/L — ABNORMAL LOW (ref 135–145)
TCO2: 31 mmol/L (ref 22–32)
pCO2, Ven: 43.7 mmHg — ABNORMAL LOW (ref 44–60)
pH, Ven: 7.437 — ABNORMAL HIGH (ref 7.25–7.43)
pO2, Ven: 31 mmHg — CL (ref 32–45)

## 2024-10-02 LAB — COOXEMETRY PANEL
Carboxyhemoglobin: 1.8 % — ABNORMAL HIGH (ref 0.5–1.5)
Carboxyhemoglobin: 1.4 % (ref 0.5–1.5)
Carboxyhemoglobin: 1.3 % (ref 0.5–1.5)
Methemoglobin: <0.7 % (ref 0.0–1.5)
Methemoglobin: <0.7 % (ref 0.0–1.5)
Methemoglobin: <0.7 % (ref 0.0–1.5)
O2 Saturation: 67 %
O2 Saturation: 52.9 %
O2 Saturation: 52.3 %
Total hemoglobin: 14.2 g/dL (ref 12.0–16.0)
Total hemoglobin: 15.2 g/dL (ref 12.0–16.0)
Total hemoglobin: 16 g/dL (ref 12.0–16.0)

## 2024-10-02 LAB — CBC
HCT: 42.8 % (ref 39.0–52.0)
Hemoglobin: 14.4 g/dL (ref 13.0–17.0)
MCH: 31.8 pg (ref 26.0–34.0)
MCHC: 33.6 g/dL (ref 30.0–36.0)
MCV: 94.5 fL (ref 80.0–100.0)
Platelets: 166 K/uL (ref 150–400)
RBC: 4.53 MIL/uL (ref 4.22–5.81)
RDW: 16.5 % — ABNORMAL HIGH (ref 11.5–15.5)
WBC: 5.9 K/uL (ref 4.0–10.5)
nRBC: 0.5 % — ABNORMAL HIGH (ref 0.0–0.2)

## 2024-10-02 LAB — COMPREHENSIVE METABOLIC PANEL WITH GFR
ALT: 19 U/L (ref 0–44)
AST: 20 U/L (ref 15–41)
Albumin: 3.6 g/dL (ref 3.5–5.0)
Alkaline Phosphatase: 84 U/L (ref 38–126)
Anion gap: 10 (ref 5–15)
BUN: 18 mg/dL (ref 8–23)
CO2: 29 mmol/L (ref 22–32)
Calcium: 8.3 mg/dL — ABNORMAL LOW (ref 8.9–10.3)
Chloride: 94 mmol/L — ABNORMAL LOW (ref 98–111)
Creatinine, Ser: 1.28 mg/dL — ABNORMAL HIGH (ref 0.61–1.24)
GFR, Estimated: >60 mL/min
Glucose, Bld: 100 mg/dL — ABNORMAL HIGH (ref 70–99)
Potassium: 3 mmol/L — ABNORMAL LOW (ref 3.5–5.1)
Sodium: 133 mmol/L — ABNORMAL LOW (ref 135–145)
Total Bilirubin: 1.1 mg/dL (ref 0.0–1.2)
Total Protein: 5.8 g/dL — ABNORMAL LOW (ref 6.5–8.1)

## 2024-10-02 LAB — BASIC METABOLIC PANEL WITH GFR
Anion gap: 10 (ref 5–15)
BUN: 19 mg/dL (ref 8–23)
CO2: 27 mmol/L (ref 22–32)
Calcium: 8.5 mg/dL — ABNORMAL LOW (ref 8.9–10.3)
Chloride: 92 mmol/L — ABNORMAL LOW (ref 98–111)
Creatinine, Ser: 1.3 mg/dL — ABNORMAL HIGH (ref 0.61–1.24)
GFR, Estimated: >60 mL/min
Glucose, Bld: 133 mg/dL — ABNORMAL HIGH (ref 70–99)
Potassium: 4.3 mmol/L (ref 3.5–5.1)
Sodium: 129 mmol/L — ABNORMAL LOW (ref 135–145)

## 2024-10-02 LAB — MAGNESIUM: Magnesium: 1.9 mg/dL (ref 1.7–2.4)

## 2024-10-02 LAB — APTT
aPTT: 51 s — ABNORMAL HIGH (ref 24–36)
aPTT: 63 s — ABNORMAL HIGH (ref 24–36)

## 2024-10-02 LAB — PREALBUMIN: Prealbumin: 20 mg/dL (ref 18–38)

## 2024-10-02 LAB — GLUCOSE, CAPILLARY
Glucose-Capillary: 122 mg/dL — ABNORMAL HIGH (ref 70–99)
Glucose-Capillary: 149 mg/dL — ABNORMAL HIGH (ref 70–99)
Glucose-Capillary: 167 mg/dL — ABNORMAL HIGH (ref 70–99)

## 2024-10-02 LAB — HEPARIN LEVEL (UNFRACTIONATED): Heparin Unfractionated: >1.1 [IU]/mL — ABNORMAL HIGH (ref 0.30–0.70)

## 2024-10-02 LAB — DIGOXIN LEVEL: Digoxin Level: 1.4 ng/mL (ref 0.8–2.0)

## 2024-10-02 MED ORDER — SORBITOL 70 % SOLN
30.0000 mL | Freq: Once | Status: AC
  Administered 2024-10-02: 30 mL via ORAL
  Filled 2024-10-02: qty 30

## 2024-10-02 MED ORDER — POLYETHYLENE GLYCOL 3350 17 G PO PACK
17.0000 g | PACK | Freq: Every day | ORAL | Status: DC
  Administered 2024-10-02 – 2024-10-08 (×5): 17 g via ORAL
  Filled 2024-10-02 (×7): qty 1

## 2024-10-02 MED ORDER — POTASSIUM CHLORIDE 20 MEQ PO PACK
40.0000 meq | PACK | Freq: Once | ORAL | Status: AC
  Administered 2024-10-02: 40 meq via ORAL
  Filled 2024-10-02: qty 2

## 2024-10-02 MED ORDER — METOLAZONE 5 MG PO TABS: 5.0000 mg | ORAL_TABLET | Freq: Once | ORAL | Status: DC

## 2024-10-02 MED ORDER — POTASSIUM CHLORIDE CRYS ER 20 MEQ PO TBCR
40.0000 meq | EXTENDED_RELEASE_TABLET | Freq: Once | ORAL | Status: DC
  Filled 2024-10-02: qty 2

## 2024-10-02 MED ORDER — POTASSIUM CHLORIDE 10 MEQ/50ML IV SOLN
10.0000 meq | INTRAVENOUS | Status: AC
  Administered 2024-10-02 (×4): 10 meq via INTRAVENOUS
  Filled 2024-10-02: qty 50

## 2024-10-02 MED ORDER — METOLAZONE 5 MG PO TABS
5.0000 mg | ORAL_TABLET | Freq: Once | ORAL | Status: AC
  Administered 2024-10-02: 5 mg via ORAL
  Filled 2024-10-02: qty 1

## 2024-10-02 MED ORDER — NOREPINEPHRINE 4 MG/250ML-% IV SOLN
0.0000 ug/min | INTRAVENOUS | Status: DC
  Administered 2024-10-02: 3 ug/min via INTRAVENOUS
  Administered 2024-10-03 – 2024-10-05 (×5): 5 ug/min via INTRAVENOUS
  Filled 2024-10-02 (×6): qty 250

## 2024-10-02 MED ORDER — FUROSEMIDE 10 MG/ML IJ SOLN
80.0000 mg | Freq: Two times a day (BID) | INTRAMUSCULAR | Status: DC
  Administered 2024-10-02 – 2024-10-04 (×5): 80 mg via INTRAVENOUS
  Filled 2024-10-02 (×5): qty 8

## 2024-10-02 MED ORDER — HYDRALAZINE HCL 25 MG PO TABS
25.0000 mg | ORAL_TABLET | Freq: Three times a day (TID) | ORAL | Status: DC
  Administered 2024-10-02: 25 mg via ORAL
  Filled 2024-10-02: qty 1

## 2024-10-02 MED ORDER — INSULIN ASPART 100 UNIT/ML IJ SOLN
0.0000 [IU] | Freq: Three times a day (TID) | INTRAMUSCULAR | Status: DC
  Administered 2024-10-02 – 2024-10-03 (×4): 2 [IU] via SUBCUTANEOUS
  Administered 2024-10-04: 5 [IU] via SUBCUTANEOUS
  Administered 2024-10-04: 3 [IU] via SUBCUTANEOUS
  Administered 2024-10-04 – 2024-10-05 (×2): 2 [IU] via SUBCUTANEOUS
  Administered 2024-10-05 – 2024-10-06 (×3): 3 [IU] via SUBCUTANEOUS
  Administered 2024-10-06: 2 [IU] via SUBCUTANEOUS
  Administered 2024-10-06: 5 [IU] via SUBCUTANEOUS
  Administered 2024-10-07: 3 [IU] via SUBCUTANEOUS
  Administered 2024-10-07: 8 [IU] via SUBCUTANEOUS
  Administered 2024-10-07 – 2024-10-08 (×3): 3 [IU] via SUBCUTANEOUS
  Administered 2024-10-08: 2 [IU] via SUBCUTANEOUS
  Administered 2024-10-09: 3 [IU] via SUBCUTANEOUS
  Administered 2024-10-09: 8 [IU] via SUBCUTANEOUS
  Administered 2024-10-10: 3 [IU] via SUBCUTANEOUS
  Administered 2024-10-10: 2 [IU] via SUBCUTANEOUS
  Administered 2024-10-10: 5 [IU] via SUBCUTANEOUS
  Administered 2024-10-11 – 2024-10-12 (×6): 3 [IU] via SUBCUTANEOUS
  Administered 2024-10-13: 2 [IU] via SUBCUTANEOUS
  Administered 2024-10-13: 3 [IU] via SUBCUTANEOUS
  Filled 2024-10-02: qty 2
  Filled 2024-10-02 (×2): qty 3
  Filled 2024-10-02: qty 5
  Filled 2024-10-02: qty 2
  Filled 2024-10-02: qty 8
  Filled 2024-10-02: qty 3
  Filled 2024-10-02: qty 2
  Filled 2024-10-02: qty 3
  Filled 2024-10-02 (×3): qty 2
  Filled 2024-10-02 (×4): qty 3
  Filled 2024-10-02: qty 5
  Filled 2024-10-02: qty 8
  Filled 2024-10-02 (×4): qty 3
  Filled 2024-10-02 (×2): qty 2
  Filled 2024-10-02: qty 3
  Filled 2024-10-02: qty 2
  Filled 2024-10-02: qty 5

## 2024-10-02 MED ORDER — AMIODARONE HCL IN DEXTROSE 360-4.14 MG/200ML-% IV SOLN
30.0000 mg/h | INTRAVENOUS | Status: AC
  Administered 2024-10-02 – 2024-10-19 (×32): 30 mg/h via INTRAVENOUS
  Administered 2024-10-19 – 2024-10-22 (×11): 60 mg/h via INTRAVENOUS
  Administered 2024-10-22 – 2024-10-27 (×9): 30 mg/h via INTRAVENOUS
  Filled 2024-10-02: qty 200
  Filled 2024-10-02: qty 400
  Filled 2024-10-02 (×40): qty 200

## 2024-10-02 MED ORDER — MAGNESIUM SULFATE 2 GM/50ML IV SOLN
2.0000 g | Freq: Once | INTRAVENOUS | Status: AC
  Administered 2024-10-02: 2 g via INTRAVENOUS
  Filled 2024-10-02: qty 50

## 2024-10-02 MED ORDER — CHLORHEXIDINE GLUCONATE CLOTH 2 % EX PADS
6.0000 | MEDICATED_PAD | Freq: Every day | CUTANEOUS | Status: DC
  Administered 2024-10-02 – 2024-10-13 (×11): 6 via TOPICAL

## 2024-10-02 MED ORDER — DIGOXIN 125 MCG PO TABS
0.1250 mg | ORAL_TABLET | ORAL | Status: DC
  Administered 2024-10-03 – 2024-10-11 (×5): 0.125 mg via ORAL
  Filled 2024-10-02 (×5): qty 1

## 2024-10-02 NOTE — Progress Notes (Signed)
 ANTICOAGULATION CONSULT NOTE  Pharmacy Consult for heparin   Indication: atrial fibrillation (Eliquis  PTA)  Allergies[1]  Patient Measurements: Height: 6' 6 (198.1 cm) Weight: 101.7 kg (224 lb 3.3 oz) IBW/kg (Calculated) : 91.4 Heparin  Dosing Weight: 104 kg   Vital Signs: Temp: 97.9 F (36.6 C) (12/19 0730) Temp Source: Bladder (12/18 2000) BP: 95/49 (12/19 0730) Pulse Rate: 70 (12/19 0730)  Labs: Recent Labs    10/01/24 0604 10/01/24 0831 10/02/24 0431  HGB 15.0 15.3  --   HCT 44.0 45.0  --   PLT 196  --   --   APTT  --   --  51*  HEPARINUNFRC  --   --  >1.10*  CREATININE 1.70*  --  1.28*    Estimated Creatinine Clearance: 76.4 mL/min (A) (by C-G formula based on SCr of 1.28 mg/dL (H)).  Medical History: Past Medical History:  Diagnosis Date   CKD (chronic kidney disease) stage 2, GFR 60-89 ml/min    COPD (chronic obstructive pulmonary disease) (HCC)    Diabetes mellitus type II, non insulin  dependent (HCC)    Dilated aortic root    Elevated PSA    Hypercholesteremia    Hypertension    NICM (nonischemic cardiomyopathy) (HCC)    NSVT (nonsustained ventricular tachycardia) (HCC)    Obstructive sleep apnea    Paroxysmal atrial fibrillation (HCC)    Pulmonary hypertension (HCC)    PVC's (premature ventricular contractions)    Tobacco abuse     Medications:  See MAR  PTA Anticoagulation: Eliquis  - last dose 12/17 @1700   Assessment: 78 yoM presents for RHC before planned LVAD implant 12/23.  Pharmacy consulted for heparin  dosing.  HL > 1.1 (as expected w/ recent DOAC), aPTT 51 sec below goal on 1400 units/h. CBC stable - Hgb 14.4, pltc 166 stable.    Goal of Therapy:  Heparin  level 0.3-0.7 units/ml aPTT 66-102 seconds Monitor platelets by anticoagulation protocol: Yes   Plan:  Increase heparin  infusion 1600 units/hr 6 hour anti-Xa level Daily CBC, anti-Xa level Monitor for s/sx of bleeding  Maurilio Fila, PharmD Clinical Pharmacist 10/02/2024   7:57 AM     [1] No Known Allergies

## 2024-10-02 NOTE — Progress Notes (Signed)
 ANTICOAGULATION CONSULT NOTE  Pharmacy Consult for heparin   Indication: atrial fibrillation (Eliquis  PTA)  Allergies[1]  Patient Measurements: Height: 6' 6 (198.1 cm) Weight: 101.7 kg (224 lb 3.3 oz) IBW/kg (Calculated) : 91.4 Heparin  Dosing Weight: 104 kg   Vital Signs: Temp: 98.1 F (36.7 C) (12/19 1630) BP: 110/77 (12/19 1630) Pulse Rate: 65 (12/19 1630)  Labs: Recent Labs    10/01/24 0604 10/01/24 0830 10/01/24 0831 10/02/24 0431 10/02/24 0925 10/02/24 1554  HGB 15.0 15.6 15.3  --  14.4  --   HCT 44.0 46.0 45.0  --  42.8  --   PLT 196  --   --   --  166  --   APTT  --   --   --  51*  --  63*  HEPARINUNFRC  --   --   --  >1.10*  --   --   CREATININE 1.70*  --   --  1.28*  --   --     Estimated Creatinine Clearance: 76.4 mL/min (A) (by C-G formula based on SCr of 1.28 mg/dL (H)).  Medical History: Past Medical History:  Diagnosis Date   CKD (chronic kidney disease) stage 2, GFR 60-89 ml/min    COPD (chronic obstructive pulmonary disease) (HCC)    Diabetes mellitus type II, non insulin  dependent (HCC)    Dilated aortic root    Elevated PSA    Hypercholesteremia    Hypertension    NICM (nonischemic cardiomyopathy) (HCC)    NSVT (nonsustained ventricular tachycardia) (HCC)    Obstructive sleep apnea    Paroxysmal atrial fibrillation (HCC)    Pulmonary hypertension (HCC)    PVC's (premature ventricular contractions)    Tobacco abuse     Medications:  See MAR  PTA Anticoagulation: Eliquis  - last dose 12/17 @1700   Assessment: 26 yoM presents for RHC before planned LVAD implant 12/23.  Pharmacy consulted for heparin  dosing.  aPTT this evening came back at 63, on 1600 units/hr. Hgb 14.4, plt 166. No s/sx of bleeding or infusion issues.   Goal of Therapy:  Heparin  level 0.3-0.7 units/ml aPTT 66-102 seconds Monitor platelets by anticoagulation protocol: Yes   Plan:  Increase heparin  infusion 1750 units/hr 6 hour anti-Xa level with AM labs  Daily  CBC, anti-Xa level Monitor for s/sx of bleeding  Thank you for allowing pharmacy to participate in this patient's care,  Suzen Sour, PharmD, BCCCP Clinical Pharmacist  Phone: [phone] 10/02/2024 4:49 PM  Please check AMION for all [facility] phone numbers After 10:00 PM, call Main Pharmacy [phone]      [1] No Known Allergies

## 2024-10-02 NOTE — Plan of Care (Signed)
" °  Problem: Health Behavior/Discharge Planning: Goal: Ability to safely manage health-related needs after discharge will improve Outcome: Progressing   Problem: Health Behavior/Discharge Planning: Goal: Ability to manage health-related needs will improve Outcome: Progressing   Problem: Clinical Measurements: Goal: Respiratory complications will improve Outcome: Progressing   Problem: Safety: Goal: Ability to remain free from injury will improve Outcome: Progressing   Problem: Skin Integrity: Goal: Risk for impaired skin integrity will decrease Outcome: Progressing   "

## 2024-10-02 NOTE — Progress Notes (Addendum)
 "    Advanced Heart Failure Rounding Note  Cardiologist: Maude Emmer, MD  [facility] Cardiologist: Dr. Zenaida  Chief Complaint: Chronic systolic heart failure  Patient Profile   James [name] is a 63 y.o. male with history of stage D cardiomyopathy/chronic systolic heart failure now end-stage w/ inotrope dependence, CKD IIIa, hx VT, PAF, prostate cancer, meningioma.    Admitted for LVAD implant.  Significant events:   12/18: Milrinone  titrated from 0.25 to 0.5 mcg/kg/min post RHC d/t low-output and pulmonary hypertension  Subjective:    Remains on milrinone  at 0.5 mcg/kg/min   Swan #s CVP 10 PA 69/20 TD CO 5.6 TD CI 2.37 Co-ox 67%  2.6L UOP last 24 hrs with IV lasix  80 BID. ? Weight down 6 lbs.  Feels the best he has in weeks. No shortness of breath. Ate 2 sandwiches yesterday afternoon and all of his breakfast.   Objective:   Weight Range: 101.7 kg Body mass index is 25.91 kg/m.   Vital Signs:   Temp:  [97.3 F (36.3 C)-98.8 F (37.1 C)] 97.9 F (36.6 C) (12/19 0811) Pulse Rate:  [0-83] 61 (12/19 0811) Resp:  [11-32] 18 (12/19 0811) BP: (92-137)/(49-102) 96/68 (12/19 0811) SpO2:  [93 %-98 %] 97 % (12/19 0811) Weight:  [101.7 kg] 101.7 kg (12/19 0500) Last BM Date :  (PTA)  Weight change: Filed Weights   10/01/24 0607 10/02/24 0500  Weight: 104.3 kg 101.7 kg    Intake/Output:   Intake/Output Summary (Last 24 hours) at 10/02/2024 0833 Last data filed at 10/02/2024 0700 Gross per 24 hour  Intake 528.29 ml  Output 2575 ml  Net -2046.71 ml     Physical Exam   General: Well appearing.  Cor: R internal jugular Swan. JVP ~ 10 cm. Regular rate & rhythm. No murmurs.  Lungs: clear Extremities: no edema   Telemetry   SR with frequent PVCs and short runs NSVT  Labs   CBC Recent Labs    10/01/24 0604 10/01/24 0831  WBC 7.8  --   HGB 15.0 15.3  HCT 44.0 45.0  MCV 92.1  --   PLT 196  --    Basic Metabolic Panel Recent Labs    87/81/74 0604  10/01/24 0831 10/01/24 1030 10/02/24 0431  NA 131* 132*  --  133*  K 3.1* 3.2*  --  3.0*  CL 90*  --   --  94*  CO2 28  --   --  29  GLUCOSE 131*  --   --  100*  BUN 21  --   --  18  CREATININE 1.70*  --   --  1.28*  CALCIUM  8.9  --   --  8.3*  MG  --   --  1.5*  --    Liver Function Tests Recent Labs    10/02/24 0431  AST 20  ALT 19  ALKPHOS 84  BILITOT 1.1  PROT 5.8*  ALBUMIN 3.6   No results for input(s): LIPASE, AMYLASE in the last 72 hours. Cardiac Enzymes No results for input(s): CKTOTAL, CKMB, CKMBINDEX, TROPONINI in the last 72 hours.  BNP: BNP (last 3 results) Recent Labs    06/10/24 1213 07/09/24 1649 07/23/24 0959  BNP 1,071.5* 3,316.0* 595.8*    ProBNP (last 3 results) Recent Labs    10/01/24 1030  PROBNP 8,374.0*     D-Dimer No results for input(s): DDIMER in the last 72 hours. Hemoglobin A1C No results for input(s): HGBA1C in the last 72 hours.  Fasting Lipid Panel No results for input(s): CHOL, HDL, LDLCALC, TRIG, CHOLHDL, LDLDIRECT in the last 72 hours. Medications:   Scheduled Medications:  amiodarone   200 mg Oral BID   atorvastatin   40 mg Oral Daily   Chlorhexidine  Gluconate Cloth  6 each Topical Daily   [START ON 10/03/2024] digoxin   0.125 mg Oral QODAY   hydrALAZINE   50 mg Oral Q8H   mexiletine  200 mg Oral Q8H   potassium chloride   40 mEq Oral Once   sacubitril -valsartan   1 tablet Oral BID   sodium chloride  flush  3 mL Intravenous Q12H   spironolactone   25 mg Oral QHS    Infusions:  sodium chloride      heparin  1,400 Units/hr (10/02/24 0700)   milrinone  0.5 mcg/kg/min (10/02/24 0700)   potassium chloride       PRN Medications: sodium chloride , acetaminophen , acetaminophen , fentaNYL  (SUBLIMAZE ) injection, ondansetron  (ZOFRAN ) IV, ondansetron , sodium chloride  flush  Assessment/Plan   1. Acute on chronic systolic CHF: Nonischemic cardiomyopathy.  Last echo in 9/25 with EF 10-15%, severe LV  dilation, mild RVE with mild-moderate RV dilation, moderate MR, moderate TR, dilated IVC. H/o low output HF, has been on home milrinone  0.25.  Extensive discussion recently re: transplant vs LVAD.  Finally, it appears that patient has settled on LVAD as his preference.  He was brought in for hemodynamic optimization prior to LVAD.  RHC 12/18 showed elevated filling pressures with mod-severe mixed pulmonary arterial/pulmonary venous hypertension and low CO despite milrinone  0.25.  - Now on milrinone  0.5 with TD CI 2.3 and Co-ox 67%. - CVP 10. Continue IV lasix  80 BID.  - Will follow closely, may need Impella 5.5 vs IABP pre-op.  - Continue Entresto  24/26 bid - Decrease hydralazine  to 25 TID with soft blood pressures - Continue spironolactone  25 daily - Hold Farxiga  pre-cardiac surgery.  - Dig level 1.4, decrease to 0.125 mg qod - Tentatively scheduled for LVAD on 12/23.  2. Pulmonary hypertension: Moderate-severe mixed pulmonary arterial/pulmonary venous hypertension on RHC 12/18.  - Continue milrinone  - PA 60s-70s/20s-30s this am, similar to cath lab - When he is more euvolemic, can add sildenafil  20 tid.  3.  CKD stage 3: Scr 1.7>1.28.  - Follow with diuresis 4.  PVCs/NSVT: Has Biotronik ICD.   - Switch po amiodarone  to IV amiodarone  at 30/hr d/t increased ectopy - Continue mexiletine.  5.  Atrial fibrillation: Paroxysmal.  NSR today.  - Continue amiodarone  as above.  - Eliquis  converted to heparin  gtt in anticipation of surgery/possible need for pre-op MCS. 6. Meningioma: Monitoring.  7. Prostate cancer: Treated with radiation.    CRITICAL CARE Performed by: COLLETTA SHAVER N   Total critical care time: 17 minutes  Critical care time was exclusive of separately billable procedures and treating other patients.  Critical care was necessary to treat or prevent imminent or life-threatening deterioration.  Critical care was time spent personally by me on the following activities:  development of treatment plan with patient and/or surrogate as well as nursing, discussions with consultants, evaluation of patient's response to treatment, examination of patient, obtaining history from patient or surrogate, ordering and performing treatments and interventions, ordering and review of laboratory studies, ordering and review of radiographic studies, pulse oximetry and re-evaluation of patient's condition.   Length of Stay: 1  FINCH, LINDSAY N, PA-C  10/02/2024, 8:33 AM  Advanced Heart Failure Team Pager [phone] (M-F; 7a - 5p)   Please visit [email or web address]: For overnight coverage please call cardiology fellow first. If fellow  not available call Shock/ECMO MD on call.  For ECMO / Mechanical Support (Impella, IABP, LVAD) issues call Shock / ECMO MD on call.    Patient seen with PA, I formulated the plan and agree with the above note.   I/Os net negative 1797 with Lasix  80 mg IV bid. Creatinine 1.7 => 1.28.  He remains on milrinone  0.5 mcg/kg/min.  SBP 90s-110s.   Frequent PVCs noted.    Echo reviewed: EF < 20%, RV moderate enlarged with moderate dysfunction, mild-moderate MR, IVC dilated.   Swan numbers (personally assessed):  CVP 13-14 PA 70/28 PCWP 26 CI 2.37 Co-ox 67%  General: NAD Neck: JVP 12 cm, no thyromegaly or thyroid  nodule.  Lungs: Clear to auscultation bilaterally with normal respiratory effort. CV: Nondisplaced PMI.  Heart regular S1/S2, no S3/S4, no murmur.  1+ ankle edema.  Abdomen: Soft, nontender, no hepatosplenomegaly, no distention.  Skin: Intact without lesions or rashes.  Neurologic: Alert and oriented x 3.  Psych: Normal affect. Extremities: No clubbing or cyanosis.  HEENT: Normal.   Cardiac output stable on milrinone  0.5, will continue.  He remains volume overloaded with elevated CVP and PCWP.  Will give Lasix  80 mg IV bid today + metolazone  2.5 x 1 this morning after K replacement. Creatinine improved.  Will stop Entresto  with soft BP and will  decrease hydralazine  to 25 tid.   Elevated PA pressure, will start sildenafil  once he is better diuresed (not yet with PCWP 26 today).   Will transition from po amiodarone  to IV with frequent ectopy.   We are working towards LVAD next week.   CRITICAL CARE Performed by: Ezra Shuck   Total critical care time: 45 minutes  Critical care time was exclusive of separately billable procedures and treating other patients.  Critical care was necessary to treat or prevent imminent or life-threatening deterioration.  Critical care was time spent personally by me on the following activities: development of treatment plan with patient and/or surrogate as well as nursing, discussions with consultants, evaluation of patient's response to treatment, examination of patient, obtaining history from patient or surrogate, ordering and performing treatments and interventions, ordering and review of laboratory studies, ordering and review of radiographic studies, pulse oximetry and re-evaluation of patient's condition.  Ezra Shuck 10/02/2024 10:13 AM  "

## 2024-10-02 NOTE — Progress Notes (Addendum)
 Seen on afternoon rounds.   Feeling well. Just went for a walk in the hall.   Swan #s CVP 9 PA 60/21 TD CI 1.4 (performed 3 times) TD CI higher this am at 2.3.   UOP not robust but did not receive am dose of metolazone  with his lasix . Will time metolazone  to be given with PM dose of IV lasix .  Will check co-ox and calculate Fick CI.  ADDENDUM 3:33 PM  Co-ox 53% with Fick CI of 1.5. Morning co-ox was 67%.  Discussed with Dr. Rolan.  Start NE at fixed dose, 3 mcg/min.  Manuelita Dutch, PA-C Advanced Heart Failure  Seen with PA, agree with the above note.   Feels good and has walked unit a couple of times, but hemodynamics look worse this evening, CO significantly lower than it was earlier today.  Will continue milrinone  0.5 and add fixed dose NE 3 mcg/min.    Repeat co-ox 52% with CI 1.9 thermo.  He feels and looks good.  Will increase fixed dose NE to 5 and continue diuresis.  Discussed with Dr. Raye Ezra Rolan 10/02/2024 4:07 PM

## 2024-10-03 DIAGNOSIS — I5023 Acute on chronic systolic (congestive) heart failure: Secondary | ICD-10-CM | POA: Diagnosis not present

## 2024-10-03 DIAGNOSIS — R570 Cardiogenic shock: Secondary | ICD-10-CM

## 2024-10-03 LAB — CBC
HCT: 43.8 % (ref 39.0–52.0)
Hemoglobin: 14.8 g/dL (ref 13.0–17.0)
MCH: 31.5 pg (ref 26.0–34.0)
MCHC: 33.8 g/dL (ref 30.0–36.0)
MCV: 93.2 fL (ref 80.0–100.0)
Platelets: 156 K/uL (ref 150–400)
RBC: 4.7 MIL/uL (ref 4.22–5.81)
RDW: 16.4 % — ABNORMAL HIGH (ref 11.5–15.5)
WBC: 8.7 K/uL (ref 4.0–10.5)
nRBC: 0 % (ref 0.0–0.2)

## 2024-10-03 LAB — COOXEMETRY PANEL
Carboxyhemoglobin: 1.9 % — ABNORMAL HIGH (ref 0.5–1.5)
Methemoglobin: <0.7 % (ref 0.0–1.5)
O2 Saturation: 62.1 %
Total hemoglobin: 15.1 g/dL (ref 12.0–16.0)

## 2024-10-03 LAB — MAGNESIUM: Magnesium: 2 mg/dL (ref 1.7–2.4)

## 2024-10-03 LAB — APTT
aPTT: 110 s — ABNORMAL HIGH (ref 24–36)
aPTT: 91 s — ABNORMAL HIGH (ref 24–36)

## 2024-10-03 LAB — GLUCOSE, CAPILLARY
Glucose-Capillary: 141 mg/dL — ABNORMAL HIGH (ref 70–99)
Glucose-Capillary: 141 mg/dL — ABNORMAL HIGH (ref 70–99)
Glucose-Capillary: 136 mg/dL — ABNORMAL HIGH (ref 70–99)
Glucose-Capillary: 157 mg/dL — ABNORMAL HIGH (ref 70–99)

## 2024-10-03 LAB — BASIC METABOLIC PANEL WITH GFR
Anion gap: 11 (ref 5–15)
BUN: 19 mg/dL (ref 8–23)
CO2: 28 mmol/L (ref 22–32)
Calcium: 8.4 mg/dL — ABNORMAL LOW (ref 8.9–10.3)
Chloride: 92 mmol/L — ABNORMAL LOW (ref 98–111)
Creatinine, Ser: 1.25 mg/dL — ABNORMAL HIGH (ref 0.61–1.24)
GFR, Estimated: >60 mL/min
Glucose, Bld: 144 mg/dL — ABNORMAL HIGH (ref 70–99)
Potassium: 3.2 mmol/L — ABNORMAL LOW (ref 3.5–5.1)
Sodium: 130 mmol/L — ABNORMAL LOW (ref 135–145)

## 2024-10-03 LAB — HEPARIN LEVEL (UNFRACTIONATED): Heparin Unfractionated: >1.1 [IU]/mL — ABNORMAL HIGH (ref 0.30–0.70)

## 2024-10-03 MED ORDER — POTASSIUM CHLORIDE CRYS ER 20 MEQ PO TBCR: 60.0000 meq | EXTENDED_RELEASE_TABLET | Freq: Two times a day (BID) | ORAL | Status: DC

## 2024-10-03 MED ORDER — POTASSIUM CHLORIDE CRYS ER 20 MEQ PO TBCR
20.0000 meq | EXTENDED_RELEASE_TABLET | Freq: Once | ORAL | Status: DC
  Filled 2024-10-03: qty 1

## 2024-10-03 MED ORDER — SODIUM CHLORIDE 0.9% FLUSH
10.0000 mL | Freq: Two times a day (BID) | INTRAVENOUS | Status: DC
  Administered 2024-10-03: 10 mL
  Administered 2024-10-03 – 2024-10-04 (×2): 40 mL
  Administered 2024-10-04 – 2024-10-13 (×17): 10 mL

## 2024-10-03 MED ORDER — POTASSIUM CHLORIDE 20 MEQ PO PACK
60.0000 meq | PACK | Freq: Once | ORAL | Status: AC
  Administered 2024-10-03: 60 meq via ORAL
  Filled 2024-10-03: qty 3

## 2024-10-03 MED ORDER — POTASSIUM CHLORIDE CRYS ER 20 MEQ PO TBCR: 20.0000 meq | EXTENDED_RELEASE_TABLET | Freq: Every day | ORAL | Status: DC

## 2024-10-03 MED ORDER — LACTULOSE 10 GM/15ML PO SOLN
30.0000 g | Freq: Once | ORAL | Status: AC
  Administered 2024-10-03: 30 g via ORAL
  Filled 2024-10-03: qty 45

## 2024-10-03 MED ORDER — SILDENAFIL CITRATE 20 MG PO TABS
20.0000 mg | ORAL_TABLET | Freq: Three times a day (TID) | ORAL | Status: DC
  Administered 2024-10-03 – 2024-10-11 (×24): 20 mg via ORAL
  Filled 2024-10-03 (×25): qty 1

## 2024-10-03 MED ORDER — SODIUM CHLORIDE 0.9 % IV SOLN: INTRAVENOUS | Status: AC

## 2024-10-03 MED ORDER — POTASSIUM CHLORIDE 20 MEQ PO PACK
20.0000 meq | PACK | Freq: Once | ORAL | Status: AC
  Administered 2024-10-03: 20 meq
  Filled 2024-10-03: qty 1

## 2024-10-03 MED ORDER — POTASSIUM CHLORIDE CRYS ER 20 MEQ PO TBCR
60.0000 meq | EXTENDED_RELEASE_TABLET | Freq: Once | ORAL | Status: DC
  Filled 2024-10-03: qty 3

## 2024-10-03 MED ORDER — POTASSIUM CHLORIDE 20 MEQ PO PACK
40.0000 meq | PACK | Freq: Once | ORAL | Status: AC
  Administered 2024-10-03: 40 meq
  Filled 2024-10-03: qty 2

## 2024-10-03 MED ORDER — SODIUM CHLORIDE 0.9% FLUSH: 10.0000 mL | INTRAVENOUS | Status: DC | PRN

## 2024-10-03 NOTE — Progress Notes (Signed)
 Brief Progress Note  Feels good this morning, still no BM. Co-ox 62 on Milrinone  5, levo 5  Vitals:   10/03/24 0705 10/03/24 0800  BP: 111/79 99/79  Pulse: 72 69  Resp: 15 (!) 24  Temp: 97.7 F (36.5 C) 98.4 F (36.9 C)  SpO2: 99% 94%   Plan: - Lactulose  and soap suds enema today - Remainder care per HF  Con Clunes, MD Cardiothoracic Surgery Pager: [phone]

## 2024-10-03 NOTE — Progress Notes (Signed)
 "    Advanced Heart Failure Rounding Note  Cardiologist: Maude Emmer, MD  [facility] Cardiologist: Dr. Zenaida  Chief Complaint: Chronic systolic heart failure  Patient Profile   James [name] is a 63 y.o. male with history of stage D cardiomyopathy/chronic systolic heart failure now end-stage w/ inotrope dependence, CKD IIIa, hx VT, PAF, prostate cancer, meningioma.    Admitted for LVAD implant.  Significant events:   12/18: Milrinone  titrated from 0.25 to 0.5 mcg/kg/min post RHC d/t low-output and pulmonary hypertension  Subjective:    Remains on milrinone  0.5 Co-ox 62%   PAP: (55-90)/(15-40) 78/29 CVP:  [0 mmHg-61 mmHg] 12 mmHg CO:  [3.4 L/min-4.4 L/min] 3.6 L/min CI:  [1.43 L/min/m2-1.9 L/min/m2] 1.5 L/min/m2  Diuresing with IV lasix . Feels good.   ON IV amio for PVCs  Objective:   Weight Range: 104.3 kg Body mass index is 26.57 kg/m.   Vital Signs:   Temp:  [97.5 F (36.4 C)-98.8 F (37.1 C)] 97.9 F (36.6 C) (12/20 1600) Pulse Rate:  [52-83] 65 (12/20 1600) Resp:  [9-37] 19 (12/20 1600) BP: (99-138)/(34-93) 110/85 (12/20 1600) SpO2:  [73 %-100 %] 96 % (12/20 1600) Weight:  [104.3 kg] 104.3 kg (12/20 0500) Last BM Date :  (PTA)  Weight change: Filed Weights   10/01/24 0607 10/02/24 0500 10/03/24 0500  Weight: 104.3 kg 101.7 kg 104.3 kg    Intake/Output:   Intake/Output Summary (Last 24 hours) at 10/03/2024 1807 Last data filed at 10/03/2024 1600 Gross per 24 hour  Intake 2435.85 ml  Output 2175 ml  Net 260.85 ml     Physical Exam   General:  Sitting up in chair. No resp difficulty HEENT: normal Neck: supple. RIJ swan Cor: Regular rate & rhythm. No rubs, gallops or murmurs. Lungs: clear Abdomen: obese soft, nontender, nondistended.Good bowel sounds. Extremities: no cyanosis, clubbing, rash, 1+ edema Neuro: alert & orientedx3, cranial nerves grossly intact. moves all 4 extremities w/o difficulty. Affect pleasant   Telemetry   SR 60s with  PVCs Personally reviewed  Labs   CBC Recent Labs    10/02/24 0925 10/03/24 0407  WBC 5.9 8.7  HGB 14.4 14.8  HCT 42.8 43.8  MCV 94.5 93.2  PLT 166 156   Basic Metabolic Panel Recent Labs    87/80/74 0431 10/02/24 1400 10/03/24 0407  NA 133* 129* 130*  K 3.0* 4.3 3.2*  CL 94* 92* 92*  CO2 29 27 28   GLUCOSE 100* 133* 144*  BUN 18 19 19   CREATININE 1.28* 1.30* 1.25*  CALCIUM  8.3* 8.5* 8.4*  MG 1.9  --  2.0   Liver Function Tests Recent Labs    10/02/24 0431  AST 20  ALT 19  ALKPHOS 84  BILITOT 1.1  PROT 5.8*  ALBUMIN  3.6   No results for input(s): LIPASE, AMYLASE in the last 72 hours. Cardiac Enzymes No results for input(s): CKTOTAL, CKMB, CKMBINDEX, TROPONINI in the last 72 hours.  BNP: BNP (last 3 results) Recent Labs    06/10/24 1213 07/09/24 1649 07/23/24 0959  BNP 1,071.5* 3,316.0* 595.8*    ProBNP (last 3 results) Recent Labs    10/01/24 1030  PROBNP 8,374.0*     D-Dimer No results for input(s): DDIMER in the last 72 hours. Hemoglobin A1C No results for input(s): HGBA1C in the last 72 hours. Fasting Lipid Panel No results for input(s): CHOL, HDL, LDLCALC, TRIG, CHOLHDL, LDLDIRECT in the last 72 hours. Medications:   Scheduled Medications:  atorvastatin   40 mg Oral Daily  Chlorhexidine  Gluconate Cloth  6 each Topical Daily   digoxin   0.125 mg Oral QODAY   furosemide   80 mg Intravenous BID   insulin  aspart  0-15 Units Subcutaneous TID WC   mexiletine  200 mg Oral Q8H   polyethylene glycol  17 g Oral Daily   sodium chloride  flush  10-40 mL Intracatheter Q12H   sodium chloride  flush  3 mL Intravenous Q12H   spironolactone   25 mg Oral QHS    Infusions:  sodium chloride  10 mL/hr at 10/03/24 1600   amiodarone  30 mg/hr (10/03/24 1600)   heparin  1,700 Units/hr (10/03/24 1600)   milrinone  0.5 mcg/kg/min (10/03/24 1600)   norepinephrine  (LEVOPHED ) Adult infusion 5 mcg/min (10/03/24 1600)    PRN  Medications: acetaminophen , acetaminophen , fentaNYL  (SUBLIMAZE ) injection, ondansetron  (ZOFRAN ) IV, ondansetron , sodium chloride  flush, sodium chloride  flush  Assessment/Plan   1. Acute on chronic systolic CHF: Nonischemic cardiomyopathy.  Last echo in 9/25 with EF 10-15%, severe LV dilation, mild RVE with mild-moderate RV dilation, moderate MR, moderate TR, dilated IVC. H/o low output HF, has been on home milrinone  0.25.  Extensive discussion recently re: transplant vs LVAD.  Finally, it appears that patient has settled on LVAD as his preference.  He was brought in for hemodynamic optimization prior to LVAD.  RHC 12/18 showed elevated filling pressures with mod-severe mixed pulmonary arterial/pulmonary venous hypertension and low CO despite milrinone  0.25.  - Now on milrinone  0.5. Thermo outputs are low but Co-ox ox and diuresing well.  - Will continue current dose milrinone  - Still volume overloaded Continue IV lasix  80 BID.  - Will follow closely, may need Impella 5.5 vs IABP pre-op.  - Continue Entresto  24/26 bid - Continue hydralazine  to 25 TID with soft blood pressures - Continue spironolactone  25 daily - Hold Farxiga  pre-cardiac surgery.  - Dig level 1.4, decrease to 0.125 mg qod - Tentatively scheduled for LVAD on 12/23. Would like to see pulmonary pressures come down pre-op  2. Pulmonary hypertension: Moderate-severe mixed pulmonary arterial/pulmonary venous hypertension on RHC 12/18.  - Continue milrinone  - PA 60s-70s/20s-30s this am, similar to cath lab - Diuresing well. Will add sildenafil  20 tid 3.  CKD stage 3: Scr 1.7>1.28.  - Follow with diuresis 4.  PVCs/NSVT: Has Biotronik ICD.   - Continue IV amio and mexilitene 5.  Atrial fibrillation: Paroxysmal.  NSR today.  - Continue amiodarone  as above.  - Eliquis  converted to heparin  gtt in anticipation of surgery/possible need for pre-op MCS. - PharmD managing 6. Meningioma: Monitoring.  7. Prostate cancer: Treated with  radiation.  8. Hypokalemia - supp   CRITICAL CARE Performed by: Toribio Fuel   Total critical care time: 37 minutes  Critical care time was exclusive of separately billable procedures and treating other patients.  Critical care was necessary to treat or prevent imminent or life-threatening deterioration.  Critical care was time spent personally by me on the following activities: development of treatment plan with patient and/or surrogate as well as nursing, discussions with consultants, evaluation of patient's response to treatment, examination of patient, obtaining history from patient or surrogate, ordering and performing treatments and interventions, ordering and review of laboratory studies, ordering and review of radiographic studies, pulse oximetry and re-evaluation of patient's condition.   Length of Stay: 2  Toribio Fuel, MD  10/03/2024, 6:07 PM  Advanced Heart Failure Team Pager [phone] (M-F; 7a - 5p)   Please visit [email or web address]: For overnight coverage please call cardiology fellow first. If fellow not available call Shock/ECMO MD  on call.  For ECMO / Mechanical Support (Impella, IABP, LVAD) issues call Shock / ECMO MD on call.      "

## 2024-10-03 NOTE — Progress Notes (Signed)
 ANTICOAGULATION CONSULT NOTE  Pharmacy Consult for heparin   Indication: atrial fibrillation (Eliquis  PTA)  Allergies[1]  Patient Measurements: Height: 6' 6 (198.1 cm) Weight: 104.3 kg (229 lb 15 oz) IBW/kg (Calculated) : 91.4 Heparin  Dosing Weight: 104 kg   Vital Signs: Temp: 98.1 F (36.7 C) (12/20 1211) BP: 108/77 (12/20 1200) Pulse Rate: 65 (12/20 1211)  Labs: Recent Labs    10/01/24 0604 10/01/24 0604 10/01/24 0830 10/01/24 0831 10/02/24 0431 10/02/24 0925 10/02/24 1400 10/02/24 1554 10/03/24 0407 10/03/24 1205  HGB 15.0  --    < > 15.3  --  14.4  --   --  14.8  --   HCT 44.0  --    < > 45.0  --  42.8  --   --  43.8  --   PLT 196  --   --   --   --  166  --   --  156  --   APTT  --    < >  --   --  51*  --   --  63* 110* 91*  HEPARINUNFRC  --   --   --   --  >1.10*  --   --   --  >1.10*  --   CREATININE 1.70*  --   --   --  1.28*  --  1.30*  --  1.25*  --    < > = values in this interval not displayed.    Estimated Creatinine Clearance: 78.2 mL/min (A) (by C-G formula based on SCr of 1.25 mg/dL (H)).  Medical History: Past Medical History:  Diagnosis Date   CKD (chronic kidney disease) stage 2, GFR 60-89 ml/min    COPD (chronic obstructive pulmonary disease) (HCC)    Diabetes mellitus type II, non insulin  dependent (HCC)    Dilated aortic root    Elevated PSA    Hypercholesteremia    Hypertension    NICM (nonischemic cardiomyopathy) (HCC)    NSVT (nonsustained ventricular tachycardia) (HCC)    Obstructive sleep apnea    Paroxysmal atrial fibrillation (HCC)    Pulmonary hypertension (HCC)    PVC's (premature ventricular contractions)    Tobacco abuse      Assessment: 14 yoM presents for RHC before planned LVAD implant 12/23.  Pharmacy consulted for heparin  dosing while apixaban  on hold.  aPTT therapeutic at 91 seconds. CBC ok.  Goal of Therapy:  Heparin  level 0.3-0.7 units/ml aPTT 66-102 seconds Monitor platelets by anticoagulation protocol:  Yes   Plan:  Continue heparin  1700 units/h Daily aPTT, heparin  level, CBC  Ozell Jamaica, PharmD, BCPS, [facility] Clinical Pharmacist [phone] Please check AMION for all [facility] Pharmacy numbers 10/03/2024        [1] No Known Allergies

## 2024-10-03 NOTE — Progress Notes (Signed)
 ANTICOAGULATION CONSULT NOTE  Pharmacy Consult for heparin   Indication: atrial fibrillation (Eliquis  PTA)  Allergies[1]  Patient Measurements: Height: 6' 6 (198.1 cm) Weight: 104.3 kg (229 lb 15 oz) IBW/kg (Calculated) : 91.4 Heparin  Dosing Weight: 104 kg   Vital Signs: Temp: 97.7 F (36.5 C) (12/20 0500) BP: 124/70 (12/20 0500) Pulse Rate: 58 (12/20 0500)  Labs: Recent Labs    10/01/24 0604 10/01/24 0830 10/01/24 0831 10/02/24 0431 10/02/24 0925 10/02/24 1400 10/02/24 1554 10/03/24 0407  HGB 15.0   < > 15.3  --  14.4  --   --  14.8  HCT 44.0   < > 45.0  --  42.8  --   --  43.8  PLT 196  --   --   --  166  --   --  156  APTT  --   --   --  51*  --   --  63* 110*  HEPARINUNFRC  --   --   --  >1.10*  --   --   --  >1.10*  CREATININE 1.70*  --   --  1.28*  --  1.30*  --  1.25*   < > = values in this interval not displayed.    Estimated Creatinine Clearance: 78.2 mL/min (A) (by C-G formula based on SCr of 1.25 mg/dL (H)).  Medical History: Past Medical History:  Diagnosis Date   CKD (chronic kidney disease) stage 2, GFR 60-89 ml/min    COPD (chronic obstructive pulmonary disease) (HCC)    Diabetes mellitus type II, non insulin  dependent (HCC)    Dilated aortic root    Elevated PSA    Hypercholesteremia    Hypertension    NICM (nonischemic cardiomyopathy) (HCC)    NSVT (nonsustained ventricular tachycardia) (HCC)    Obstructive sleep apnea    Paroxysmal atrial fibrillation (HCC)    Pulmonary hypertension (HCC)    PVC's (premature ventricular contractions)    Tobacco abuse     Medications:  See MAR  PTA Anticoagulation: Eliquis  - last dose 12/17 @1700   Assessment: 73 yoM presents for RHC before planned LVAD implant 12/23.  Pharmacy consulted for heparin  dosing.  aPTT slightly supra-therapeutic on 1750 units/hr. Hgb 14s, plt stable. No s/sx of bleeding or infusion issues reported  Goal of Therapy:  Heparin  level 0.3-0.7 units/ml aPTT 66-102  seconds Monitor platelets by anticoagulation protocol: Yes   Plan:  Decrease heparin  infusion to 1700 units/hr 6 hour aPTT  aPTT until correlates with anti-Xa level Daily CBC, anti-Xa level Monitor for s/sx of bleeding  Lynwood Poplar, PharmD, BCPS Clinical Pharmacist 10/03/2024 6:04 AM     [1] No Known Allergies

## 2024-10-03 NOTE — Plan of Care (Signed)
" °  Problem: Health Behavior/Discharge Planning: Goal: Ability to safely manage health-related needs after discharge will improve Outcome: Progressing   Problem: Clinical Measurements: Goal: Respiratory complications will improve Outcome: Progressing   Problem: Activity: Goal: Risk for activity intolerance will decrease Outcome: Progressing   Problem: Health Behavior/Discharge Planning: Goal: Ability to identify and utilize available resources and services will improve Outcome: Progressing   Problem: Skin Integrity: Goal: Risk for impaired skin integrity will decrease Outcome: Progressing   "

## 2024-10-04 DIAGNOSIS — I5023 Acute on chronic systolic (congestive) heart failure: Secondary | ICD-10-CM | POA: Diagnosis not present

## 2024-10-04 LAB — CBC
HCT: 42.8 % (ref 39.0–52.0)
Hemoglobin: 14.7 g/dL (ref 13.0–17.0)
MCH: 31.6 pg (ref 26.0–34.0)
MCHC: 34.3 g/dL (ref 30.0–36.0)
MCV: 92 fL (ref 80.0–100.0)
Platelets: 129 K/uL — ABNORMAL LOW (ref 150–400)
RBC: 4.65 MIL/uL (ref 4.22–5.81)
RDW: 16.6 % — ABNORMAL HIGH (ref 11.5–15.5)
WBC: 7.6 K/uL (ref 4.0–10.5)
nRBC: 0 % (ref 0.0–0.2)

## 2024-10-04 LAB — SURGICAL PCR SCREEN
MRSA, PCR: NEGATIVE
Staphylococcus aureus: NEGATIVE

## 2024-10-04 LAB — GLUCOSE, CAPILLARY
Glucose-Capillary: 147 mg/dL — ABNORMAL HIGH (ref 70–99)
Glucose-Capillary: 226 mg/dL — ABNORMAL HIGH (ref 70–99)
Glucose-Capillary: 195 mg/dL — ABNORMAL HIGH (ref 70–99)
Glucose-Capillary: 146 mg/dL — ABNORMAL HIGH (ref 70–99)

## 2024-10-04 LAB — BASIC METABOLIC PANEL WITH GFR
Anion gap: 10 (ref 5–15)
Anion gap: 11 (ref 5–15)
BUN: 17 mg/dL (ref 8–23)
BUN: 19 mg/dL (ref 8–23)
CO2: 29 mmol/L (ref 22–32)
CO2: 27 mmol/L (ref 22–32)
Calcium: 8.4 mg/dL — ABNORMAL LOW (ref 8.9–10.3)
Calcium: 8.5 mg/dL — ABNORMAL LOW (ref 8.9–10.3)
Chloride: 89 mmol/L — ABNORMAL LOW (ref 98–111)
Chloride: 89 mmol/L — ABNORMAL LOW (ref 98–111)
Creatinine, Ser: 1.19 mg/dL (ref 0.61–1.24)
Creatinine, Ser: 1.35 mg/dL — ABNORMAL HIGH (ref 0.61–1.24)
GFR, Estimated: >60 mL/min
GFR, Estimated: 59 mL/min — ABNORMAL LOW
Glucose, Bld: 128 mg/dL — ABNORMAL HIGH (ref 70–99)
Glucose, Bld: 178 mg/dL — ABNORMAL HIGH (ref 70–99)
Potassium: 3.4 mmol/L — ABNORMAL LOW (ref 3.5–5.1)
Potassium: 4.1 mmol/L (ref 3.5–5.1)
Sodium: 128 mmol/L — ABNORMAL LOW (ref 135–145)
Sodium: 126 mmol/L — ABNORMAL LOW (ref 135–145)

## 2024-10-04 LAB — HEPARIN LEVEL (UNFRACTIONATED): Heparin Unfractionated: 0.93 [IU]/mL — ABNORMAL HIGH (ref 0.30–0.70)

## 2024-10-04 LAB — COOXEMETRY PANEL
Carboxyhemoglobin: 1.7 % — ABNORMAL HIGH (ref 0.5–1.5)
Methemoglobin: <0.7 % (ref 0.0–1.5)
O2 Saturation: 65 %
Total hemoglobin: 13.6 g/dL (ref 12.0–16.0)

## 2024-10-04 LAB — APTT: aPTT: 116 s — ABNORMAL HIGH (ref 24–36)

## 2024-10-04 LAB — MAGNESIUM: Magnesium: 1.7 mg/dL (ref 1.7–2.4)

## 2024-10-04 MED ORDER — MUPIROCIN 2 % EX OINT
1.0000 | TOPICAL_OINTMENT | Freq: Two times a day (BID) | CUTANEOUS | Status: DC
  Filled 2024-10-04: qty 22

## 2024-10-04 MED ORDER — METOLAZONE 5 MG PO TABS
5.0000 mg | ORAL_TABLET | Freq: Once | ORAL | Status: AC
  Administered 2024-10-04: 5 mg via ORAL
  Filled 2024-10-04: qty 1

## 2024-10-04 MED ORDER — POTASSIUM CHLORIDE 20 MEQ PO PACK
60.0000 meq | PACK | ORAL | Status: AC
  Administered 2024-10-04 (×2): 60 meq
  Filled 2024-10-04 (×2): qty 3

## 2024-10-04 MED ORDER — MAGNESIUM SULFATE 4 GM/100ML IV SOLN
4.0000 g | Freq: Once | INTRAVENOUS | Status: AC
  Administered 2024-10-04: 4 g via INTRAVENOUS
  Filled 2024-10-04: qty 100

## 2024-10-04 MED ORDER — ORAL CARE MOUTH RINSE: 15.0000 mL | OROMUCOSAL | Status: DC | PRN

## 2024-10-04 MED ORDER — CALCIUM CARBONATE ANTACID 500 MG PO CHEW
1.0000 | CHEWABLE_TABLET | Freq: Two times a day (BID) | ORAL | Status: DC
  Administered 2024-10-04 – 2024-10-13 (×18): 200 mg via ORAL
  Filled 2024-10-04 (×13): qty 1

## 2024-10-04 MED ORDER — POTASSIUM CHLORIDE 20 MEQ PO PACK
40.0000 meq | PACK | Freq: Once | ORAL | Status: AC
  Administered 2024-10-04: 40 meq
  Filled 2024-10-04: qty 2

## 2024-10-04 MED ORDER — FUROSEMIDE 10 MG/ML IJ SOLN
20.0000 mg/h | INTRAVENOUS | Status: DC
  Administered 2024-10-04 – 2024-10-06 (×5): 20 mg/h via INTRAVENOUS
  Filled 2024-10-04 (×4): qty 20
  Filled 2024-10-04: qty 200
  Filled 2024-10-04: qty 20

## 2024-10-04 MED ORDER — ALUM & MAG HYDROXIDE-SIMETH 200-200-20 MG/5ML PO SUSP
30.0000 mL | Freq: Once | ORAL | Status: AC
  Administered 2024-10-04: 30 mL via ORAL
  Filled 2024-10-04: qty 30

## 2024-10-04 NOTE — Progress Notes (Signed)
 Brief Progress Note  Looks great this morning, had a big BM yesterday Started sildenafil  yesterday for pHTN  Vitals:   10/04/24 0800 10/04/24 0900  BP: 109/77 103/85  Pulse: 66 71  Resp: 14 13  Temp: 98.1 F (36.7 C) 98.2 F (36.8 C)  SpO2: 96% 95%   Plan: - Inotropes per HF team - Encouraged to continue pre-hab before surgery on Tuesday  Con Clunes, MD Cardiothoracic Surgery Pager: [phone]

## 2024-10-04 NOTE — Plan of Care (Signed)
 Patient progressing well. For surgery on Tuesday for LVAD. Instructed in how to use IS. Went over how he will not be able to push with arms and will need to learn to use legs when getting up not arms.

## 2024-10-04 NOTE — Progress Notes (Addendum)
 "    Advanced Heart Failure Rounding Note  Cardiologist: Maude Emmer, MD  [facility] Cardiologist: Dr. Zenaida  Chief Complaint: Chronic systolic heart failure  Patient Profile   James [name] is a 63 y.o. male with history of stage D cardiomyopathy/chronic systolic heart failure now end-stage w/ inotrope dependence, CKD IIIa, hx VT, PAF, prostate cancer, meningioma.    Admitted for LVAD implant.  Significant events:   12/18: Milrinone  titrated from 0.25 to 0.5 mcg/kg/min post RHC d/t low-output and pulmonary hypertension  Subjective:    Remains on milrinone  0.5 and NE 5 Co-ox 65%   PAP: (68-89)/(14-40) 84/32 CVP:  [2 mmHg-26 mmHg] 10 mmHg CO:  [3.2 L/min-4.4 L/min] 4.4 L/min CI:  [1.5 L/min/m2-1.9 L/min/m2] 1.9 L/min/m2  Remains on lasix  80 IV bid. Diuresing but weight and pressures up   Objective:   Weight Range: 106 kg Body mass index is 27.01 kg/m.   Vital Signs:   Temp:  [97.7 F (36.5 C)-98.6 F (37 C)] 98.2 F (36.8 C) (12/21 0900) Pulse Rate:  [60-87] 65 (12/21 1200) Resp:  [11-26] 17 (12/21 1200) BP: (103-129)/(61-94) 108/75 (12/21 1200) SpO2:  [90 %-99 %] 95 % (12/21 1200) Weight:  [106 kg] 106 kg (12/21 0920) Last BM Date : 10/03/24  Weight change: Filed Weights   10/02/24 0500 10/03/24 0500 10/04/24 0920  Weight: 101.7 kg 104.3 kg 106 kg    Intake/Output:   Intake/Output Summary (Last 24 hours) at 10/04/2024 1217 Last data filed at 10/04/2024 1200 Gross per 24 hour  Intake 2572.11 ml  Output 3875 ml  Net -1302.89 ml     Physical Exam   General:  Sitting up in chair No resp difficulty HEENT: normal Neck: supple. RIJ swan   Cor: Regular rate & rhythm. No rubs, gallops or murmurs. Lungs: clear Abdomen: obese  soft, nontender, nondistended.Good bowel sounds. Extremities: no cyanosis, clubbing, rash,1+ edema Neuro: alert & orientedx3, cranial nerves grossly intact. moves all 4 extremities w/o difficulty. Affect pleasant    Telemetry    SR 60s with PVCs Personally reviewed  Labs   CBC Recent Labs    10/03/24 0407 10/04/24 0451  WBC 8.7 7.6  HGB 14.8 14.7  HCT 43.8 42.8  MCV 93.2 92.0  PLT 156 129*   Basic Metabolic Panel Recent Labs    87/79/74 0407 10/04/24 0451  NA 130* 128*  K 3.2* 3.4*  CL 92* 89*  CO2 28 29  GLUCOSE 144* 128*  BUN 19 17  CREATININE 1.25* 1.19  CALCIUM  8.4* 8.4*  MG 2.0 1.7   Liver Function Tests Recent Labs    10/02/24 0431  AST 20  ALT 19  ALKPHOS 84  BILITOT 1.1  PROT 5.8*  ALBUMIN  3.6   No results for input(s): LIPASE, AMYLASE in the last 72 hours. Cardiac Enzymes No results for input(s): CKTOTAL, CKMB, CKMBINDEX, TROPONINI in the last 72 hours.  BNP: BNP (last 3 results) Recent Labs    06/10/24 1213 07/09/24 1649 07/23/24 0959  BNP 1,071.5* 3,316.0* 595.8*    ProBNP (last 3 results) Recent Labs    10/01/24 1030  PROBNP 8,374.0*     D-Dimer No results for input(s): DDIMER in the last 72 hours. Hemoglobin A1C No results for input(s): HGBA1C in the last 72 hours. Fasting Lipid Panel No results for input(s): CHOL, HDL, LDLCALC, TRIG, CHOLHDL, LDLDIRECT in the last 72 hours. Medications:   Scheduled Medications:  atorvastatin   40 mg Oral Daily   Chlorhexidine  Gluconate Cloth  6 each  Topical Daily   digoxin   0.125 mg Oral QODAY   furosemide   80 mg Intravenous BID   insulin  aspart  0-15 Units Subcutaneous TID WC   mexiletine  200 mg Oral Q8H   mupirocin  ointment  1 Application Nasal BID   polyethylene glycol  17 g Oral Daily   potassium chloride   40 mEq Per Tube Once   potassium chloride   60 mEq Per Tube Q4H   sildenafil   20 mg Oral TID   sodium chloride  flush  10-40 mL Intracatheter Q12H   sodium chloride  flush  3 mL Intravenous Q12H   spironolactone   25 mg Oral QHS    Infusions:  sodium chloride  10 mL/hr at 10/04/24 1200   amiodarone  30 mg/hr (10/04/24 1200)   heparin  1,500 Units/hr (10/04/24 1200)    milrinone  0.5 mcg/kg/min (10/04/24 1200)   norepinephrine  (LEVOPHED ) Adult infusion 5 mcg/min (10/04/24 1200)    PRN Medications: acetaminophen , acetaminophen , fentaNYL  (SUBLIMAZE ) injection, ondansetron  (ZOFRAN ) IV, ondansetron , mouth rinse, sodium chloride  flush, sodium chloride  flush  Assessment/Plan   1. Acute on chronic systolic CHF: Nonischemic cardiomyopathy.  Last echo in 9/25 with EF 10-15%, severe LV dilation, mild RVE with mild-moderate RV dilation, moderate MR, moderate TR, dilated IVC. H/o low output HF, has been on home milrinone  0.25.  Extensive discussion recently re: transplant vs LVAD.  Finally, it appears that patient has settled on LVAD as his preference.  He was brought in for hemodynamic optimization prior to LVAD.  RHC 12/18 showed elevated filling pressures with mod-severe mixed pulmonary arterial/pulmonary venous hypertension and low CO despite milrinone  0.25.  - Now on milrinone  0.5. Thermo outputs are low but Co-ox ox and diuresing well.  - Will continue current dose milrinone  - Volume worse today. Add metolazone . May need lasix  gtt - Will follow closely, may need Impella 5.5 vs IABP pre-op.  - Continue Entresto  24/26 bid - Continue hydralazine  to 25 TID with soft blood pressures - Continue spironolactone  25 daily - Hold Farxiga  pre-cardiac surgery.  - Dig level 1.4, decreased to 0.125 mg qod - Tentatively scheduled for LVAD on 12/23. Would like to see pulmonary pressures come down pre-op  2. Pulmonary hypertension: Moderate-severe mixed pulmonary arterial/pulmonary venous hypertension on RHC 12/18.  - Continue milrinone  - PA pressures remain in 70s. Sildenafil  added but will not titrate with increased volume today 3.  AKI on CKD stage 3: Scr 1.7>1.28.> 1.19 - Likely cardiorenal syndrome. Improved with inotrope support - Follow with diuresis  4.  PVCs/NSVT: Has Biotronik ICD.   - Continue IV amio and mexilitene 5.  Atrial fibrillation: Paroxysmal.   - remains  in NSR today - Continue amiodarone  as above.  - Eliquis  converted to heparin  gtt in anticipation of surgery/possible need for pre-op MCS. - PharmD managing heparin   6. Meningioma: Monitoring.  7. Prostate cancer: Treated with radiation.  8. Hypokalemia - supp   CRITICAL CARE Performed by: Toribio Fuel   Total critical care time: 38 minutes  Critical care time was exclusive of separately billable procedures and treating other patients.  Critical care was necessary to treat or prevent imminent or life-threatening deterioration.  Critical care was time spent personally by me on the following activities: development of treatment plan with patient and/or surrogate as well as nursing, discussions with consultants, evaluation of patient's response to treatment, examination of patient, obtaining history from patient or surrogate, ordering and performing treatments and interventions, ordering and review of laboratory studies, ordering and review of radiographic studies, pulse oximetry and re-evaluation  of patient's condition.   Length of Stay: 3  Toribio Fuel, MD  10/04/2024, 12:17 PM  Advanced Heart Failure Team Pager [phone] (M-F; 7a - 5p)   Please visit [email or web address]: For overnight coverage please call cardiology fellow first. If fellow not available call Shock/ECMO MD on call.  For ECMO / Mechanical Support (Impella, IABP, LVAD) issues call Shock / ECMO MD on call.      "

## 2024-10-04 NOTE — Progress Notes (Signed)
 ANTICOAGULATION CONSULT NOTE  Pharmacy Consult for heparin   Indication: atrial fibrillation (Eliquis  PTA)  Allergies[1]  Patient Measurements: Height: 6' 6 (198.1 cm) Weight: 104.3 kg (229 lb 15 oz) IBW/kg (Calculated) : 91.4 Heparin  Dosing Weight: 104 kg   Vital Signs: Temp: 98.1 F (36.7 C) (12/21 0700) Temp Source: Core (12/21 0400) BP: 118/94 (12/21 0700) Pulse Rate: 87 (12/21 0700)  Labs: Recent Labs    10/02/24 0431 10/02/24 0431 10/02/24 0925 10/02/24 1400 10/02/24 1554 10/03/24 0407 10/03/24 1205 10/04/24 0451  HGB  --    < > 14.4  --   --  14.8  --  14.7  HCT  --   --  42.8  --   --  43.8  --  42.8  PLT  --   --  166  --   --  156  --  129*  APTT 51*  --   --   --    < > 110* 91* 116*  HEPARINUNFRC >1.10*  --   --   --   --  >1.10*  --  0.93*  CREATININE 1.28*  --   --  1.30*  --  1.25*  --  1.19   < > = values in this interval not displayed.    Estimated Creatinine Clearance: 82.1 mL/min (by C-G formula based on SCr of 1.19 mg/dL).  Medical History: Past Medical History:  Diagnosis Date   CKD (chronic kidney disease) stage 2, GFR 60-89 ml/min    COPD (chronic obstructive pulmonary disease) (HCC)    Diabetes mellitus type II, non insulin  dependent (HCC)    Dilated aortic root    Elevated PSA    Hypercholesteremia    Hypertension    NICM (nonischemic cardiomyopathy) (HCC)    NSVT (nonsustained ventricular tachycardia) (HCC)    Obstructive sleep apnea    Paroxysmal atrial fibrillation (HCC)    Pulmonary hypertension (HCC)    PVC's (premature ventricular contractions)    Tobacco abuse      Assessment: 68 yoM presents for RHC before planned LVAD implant 12/23.  Pharmacy consulted for heparin  dosing while apixaban  on hold.  aPTT and heparin  level both slightly elevated, appear correlating, CBC stable, no bleeding issues.  Goal of Therapy:  Heparin  level 0.3-0.7 units/ml aPTT 66-102 seconds Monitor platelets by anticoagulation protocol: Yes    Plan:  Reduce heparin  to 1500 units/h Daily heparin  level, CBC - stop aPTT checks  Ozell Jamaica, PharmD, BCPS, [facility] Clinical Pharmacist [phone] Please check AMION for all [facility] Pharmacy numbers 10/04/2024         [1] No Known Allergies

## 2024-10-05 DIAGNOSIS — I5023 Acute on chronic systolic (congestive) heart failure: Secondary | ICD-10-CM | POA: Diagnosis not present

## 2024-10-05 LAB — BASIC METABOLIC PANEL WITH GFR
Anion gap: 11 (ref 5–15)
Anion gap: 12 (ref 5–15)
BUN: 18 mg/dL (ref 8–23)
BUN: 19 mg/dL (ref 8–23)
CO2: 30 mmol/L (ref 22–32)
CO2: 30 mmol/L (ref 22–32)
Calcium: 8.8 mg/dL — ABNORMAL LOW (ref 8.9–10.3)
Calcium: 9.3 mg/dL (ref 8.9–10.3)
Chloride: 86 mmol/L — ABNORMAL LOW (ref 98–111)
Chloride: 85 mmol/L — ABNORMAL LOW (ref 98–111)
Creatinine, Ser: 1.23 mg/dL (ref 0.61–1.24)
Creatinine, Ser: 1.41 mg/dL — ABNORMAL HIGH (ref 0.61–1.24)
GFR, Estimated: >60 mL/min
GFR, Estimated: 56 mL/min — ABNORMAL LOW
Glucose, Bld: 133 mg/dL — ABNORMAL HIGH (ref 70–99)
Glucose, Bld: 150 mg/dL — ABNORMAL HIGH (ref 70–99)
Potassium: 3.2 mmol/L — ABNORMAL LOW (ref 3.5–5.1)
Potassium: 3.9 mmol/L (ref 3.5–5.1)
Sodium: 128 mmol/L — ABNORMAL LOW (ref 135–145)
Sodium: 127 mmol/L — ABNORMAL LOW (ref 135–145)

## 2024-10-05 LAB — HEPARIN LEVEL (UNFRACTIONATED): Heparin Unfractionated: 0.55 [IU]/mL (ref 0.30–0.70)

## 2024-10-05 LAB — TYPE AND SCREEN
ABO/RH(D): O POS
Antibody Screen: NEGATIVE

## 2024-10-05 LAB — APTT
aPTT: 62 s — ABNORMAL HIGH (ref 24–36)
aPTT: 66 s — ABNORMAL HIGH (ref 24–36)

## 2024-10-05 LAB — CBC
HCT: 41.4 % (ref 39.0–52.0)
Hemoglobin: 14.4 g/dL (ref 13.0–17.0)
MCH: 31.6 pg (ref 26.0–34.0)
MCHC: 34.8 g/dL (ref 30.0–36.0)
MCV: 90.8 fL (ref 80.0–100.0)
Platelets: 117 K/uL — ABNORMAL LOW (ref 150–400)
RBC: 4.56 MIL/uL (ref 4.22–5.81)
RDW: 16.5 % — ABNORMAL HIGH (ref 11.5–15.5)
WBC: 7.2 K/uL (ref 4.0–10.5)
nRBC: 0 % (ref 0.0–0.2)

## 2024-10-05 LAB — GLUCOSE, CAPILLARY
Glucose-Capillary: 133 mg/dL — ABNORMAL HIGH (ref 70–99)
Glucose-Capillary: 179 mg/dL — ABNORMAL HIGH (ref 70–99)
Glucose-Capillary: 185 mg/dL — ABNORMAL HIGH (ref 70–99)
Glucose-Capillary: 163 mg/dL — ABNORMAL HIGH (ref 70–99)

## 2024-10-05 LAB — COOXEMETRY PANEL
Carboxyhemoglobin: 1.9 % — ABNORMAL HIGH (ref 0.5–1.5)
Methemoglobin: <0.7 % (ref 0.0–1.5)
O2 Saturation: 67.9 %
Total hemoglobin: 14.3 g/dL (ref 12.0–16.0)

## 2024-10-05 LAB — MAGNESIUM: Magnesium: 1.7 mg/dL (ref 1.7–2.4)

## 2024-10-05 MED ORDER — POTASSIUM CHLORIDE 20 MEQ PO PACK
60.0000 meq | PACK | ORAL | Status: AC
  Administered 2024-10-05 (×2): 60 meq via ORAL
  Filled 2024-10-05 (×2): qty 3

## 2024-10-05 MED ORDER — METOLAZONE 5 MG PO TABS
5.0000 mg | ORAL_TABLET | Freq: Once | ORAL | Status: AC
  Administered 2024-10-05: 5 mg via ORAL
  Filled 2024-10-05: qty 1

## 2024-10-05 MED ORDER — DOBUTAMINE-DEXTROSE 4-5 MG/ML-% IV SOLN
2.5000 ug/kg/min | INTRAVENOUS | Status: DC
  Administered 2024-10-05: 2.5 ug/kg/min via INTRAVENOUS
  Filled 2024-10-05 (×2): qty 250

## 2024-10-05 MED ORDER — LACTULOSE 10 GM/15ML PO SOLN
30.0000 g | Freq: Once | ORAL | Status: AC
  Administered 2024-10-05: 30 g via ORAL
  Filled 2024-10-05: qty 45

## 2024-10-05 MED ORDER — CHLORHEXIDINE GLUCONATE 0.12 % MT SOLN
15.0000 mL | Freq: Once | OROMUCOSAL | Status: AC
  Administered 2024-10-06: 15 mL via OROMUCOSAL
  Filled 2024-10-05: qty 15

## 2024-10-05 MED ORDER — CHLORHEXIDINE GLUCONATE CLOTH 2 % EX PADS
6.0000 | MEDICATED_PAD | Freq: Once | CUTANEOUS | Status: AC
  Administered 2024-10-05: 6 via TOPICAL

## 2024-10-05 MED ORDER — MAGNESIUM SULFATE 4 GM/100ML IV SOLN
4.0000 g | Freq: Once | INTRAVENOUS | Status: AC
  Administered 2024-10-05: 4 g via INTRAVENOUS
  Filled 2024-10-05: qty 100

## 2024-10-05 MED ORDER — POTASSIUM CHLORIDE 20 MEQ PO PACK
40.0000 meq | PACK | Freq: Once | ORAL | Status: AC
  Administered 2024-10-05: 40 meq via ORAL
  Filled 2024-10-05: qty 2

## 2024-10-05 MED ORDER — ACETAZOLAMIDE 250 MG PO TABS
500.0000 mg | ORAL_TABLET | Freq: Once | ORAL | Status: AC
  Administered 2024-10-05: 500 mg via ORAL
  Filled 2024-10-05: qty 2

## 2024-10-05 NOTE — Progress Notes (Addendum)
 Seen on AM rounds with Dr. Bensimhon.   UOP has dropped off this am. Continues on lasix  gtt at 20 mg/hr.   Swan #s CVP 13 PA 86/32 (47) PCWP 37 TD CO 4.1 TD CI 1.76 SVR 1780 PVR 3.2  Continue milrinone  at 0.5 and start DBA at 2.5. Wean NE to reduce SVR.  Continue lasix  gtt at 20 mg/hr. Give 5 mg metolazone  again today.   Dr. Cherrie discussing with Dr. Lucas. May need Impella 5.5 followed by several days of optimization preVAD.  Manuelita Dutch, PA-C Advanced Heart Failure

## 2024-10-05 NOTE — Progress Notes (Addendum)
 "    Advanced Heart Failure Rounding Note  Cardiologist: Maude Emmer, MD  [facility] Cardiologist: Dr. Zenaida  Chief Complaint: Chronic systolic heart failure  Patient Profile   James [name] is a 63 y.o. male with history of stage D cardiomyopathy/chronic systolic heart failure now end-stage w/ inotrope dependence, CKD IIIa, hx VT, PAF, prostate cancer, meningioma.    Admitted for LVAD implant.  Significant events:   12/18: Milrinone  titrated from 0.25 to 0.5 mcg/kg/min post RHC d/t low-output and pulmonary hypertension  Subjective:    Remains on 0.5 milrinone  + 5 NE.   Swan #s CVP 7 PA 82/28 (42) CO 5.2 CI 2.2 Co-ox 68%   -3.8L yesterday with lasix  gtt + 5 mg metolazone .  Feeling well. Reports more energy than he's had in months. Good appetite.   Objective:   Weight Range: 102.3 kg Body mass index is 26.06 kg/m.   Vital Signs:   Temp:  [97.9 F (36.6 C)-98.6 F (37 C)] 98.2 F (36.8 C) (12/22 0700) Pulse Rate:  [53-74] 68 (12/22 0700) Resp:  [11-22] 11 (12/22 0700) BP: (97-127)/(64-86) 125/77 (12/22 0600) SpO2:  [87 %-99 %] 95 % (12/22 0700) Weight:  [102.3 kg-106 kg] 102.3 kg (12/22 0500) Last BM Date : 10/03/24  Weight change: Filed Weights   10/03/24 0500 10/04/24 0920 10/05/24 0500  Weight: 104.3 kg 106 kg 102.3 kg    Intake/Output:   Intake/Output Summary (Last 24 hours) at 10/05/2024 0706 Last data filed at 10/05/2024 0700 Gross per 24 hour  Intake 2694.37 ml  Output 6580 ml  Net -3885.63 ml     Physical Exam   General:  Sitting up in bed.  Cor: R internal jugular PA catheter. Regular rate & rhythm. No murmurs. Lungs: clear Abdomen: soft, nontender, nondistended. Extremities: trace edema Neuro: alert & orientedx3. Affect pleasant     Telemetry   SR with frequent PVCs   Labs   CBC Recent Labs    10/04/24 0451 10/05/24 0517  WBC 7.6 7.2  HGB 14.7 14.4  HCT 42.8 41.4  MCV 92.0 90.8  PLT 129* 117*   Basic Metabolic  Panel Recent Labs    10/04/24 0451 10/04/24 1800 10/05/24 0517  NA 128* 126* 128*  K 3.4* 4.1 3.2*  CL 89* 89* 86*  CO2 29 27 30   GLUCOSE 128* 178* 133*  BUN 17 19 18   CREATININE 1.19 1.35* 1.23  CALCIUM  8.4* 8.5* 8.8*  MG 1.7  --  1.7   Liver Function Tests No results for input(s): AST, ALT, ALKPHOS, BILITOT, PROT, ALBUMIN  in the last 72 hours.  No results for input(s): LIPASE, AMYLASE in the last 72 hours. Cardiac Enzymes No results for input(s): CKTOTAL, CKMB, CKMBINDEX, TROPONINI in the last 72 hours.  BNP: BNP (last 3 results) Recent Labs    06/10/24 1213 07/09/24 1649 07/23/24 0959  BNP 1,071.5* 3,316.0* 595.8*    ProBNP (last 3 results) Recent Labs    10/01/24 1030  PROBNP 8,374.0*     D-Dimer No results for input(s): DDIMER in the last 72 hours. Hemoglobin A1C No results for input(s): HGBA1C in the last 72 hours. Fasting Lipid Panel No results for input(s): CHOL, HDL, LDLCALC, TRIG, CHOLHDL, LDLDIRECT in the last 72 hours. Medications:   Scheduled Medications:  atorvastatin   40 mg Oral Daily   calcium  carbonate  1 tablet Oral BID WC   Chlorhexidine  Gluconate Cloth  6 each Topical Daily   digoxin   0.125 mg Oral QODAY   insulin  aspart  0-15 Units Subcutaneous TID WC   mexiletine  200 mg Oral Q8H   polyethylene glycol  17 g Oral Daily   sildenafil   20 mg Oral TID   sodium chloride  flush  10-40 mL Intracatheter Q12H   sodium chloride  flush  3 mL Intravenous Q12H   spironolactone   25 mg Oral QHS    Infusions:  amiodarone  30 mg/hr (10/05/24 0700)   furosemide  (LASIX ) 200 mg in dextrose  5 % 100 mL (2 mg/mL) infusion 20 mg/hr (10/05/24 0700)   heparin  1,500 Units/hr (10/05/24 0700)   milrinone  0.5 mcg/kg/min (10/05/24 0700)   norepinephrine  (LEVOPHED ) Adult infusion 5 mcg/min (10/05/24 0700)    PRN Medications: acetaminophen , acetaminophen , fentaNYL  (SUBLIMAZE ) injection, ondansetron  (ZOFRAN ) IV,  ondansetron , mouth rinse, sodium chloride  flush, sodium chloride  flush  Assessment/Plan   1. Acute on chronic systolic CHF: Nonischemic cardiomyopathy.  Last echo in 9/25 with EF 10-15%, severe LV dilation, mild RVE with mild-moderate RV dilation, moderate MR, moderate TR, dilated IVC. H/o low output HF, has been on home milrinone  0.25.  Extensive discussion recently re: transplant vs LVAD.  Finally, it appears that patient has settled on LVAD as his preference.  He was brought in for hemodynamic optimization prior to LVAD.  RHC 12/18 showed elevated filling pressures with mod-severe mixed pulmonary arterial/pulmonary venous hypertension and low CO despite milrinone  0.25.  - Now on milrinone  0.5 + 5 NE. Co-ox stable and CI 2.2. PA pressures remain significantly elevated despite brisk diuresis yesterday.  - Will follow closely, may need Impella 5.5 vs IABP pre-op or ProTek post VAD - Off hydralazine  and entresto   - Continue spironolactone  25 daily - Hold Farxiga  pre-cardiac surgery.  - Dig level 1.4, decreased to 0.125 mg qod - Tentatively scheduled for LVAD on 12/23. Would like to see pulmonary pressures come down pre-op. Will discuss with Dr. Jacobb Alen 2. Pulmonary hypertension: Moderate-severe mixed pulmonary arterial/pulmonary venous hypertension on RHC 12/18.  - Continue milrinone  - PA systolic pressures remain in 80s. Currently on sildenafil  20 TID 3.  AKI on CKD stage 3: Scr 1.7>>1.2 - Likely cardiorenal syndrome. Improved with inotrope support - Follow with diuresis  4.  PVCs/NSVT: Has Biotronik ICD.   - Continue IV amio and mexiletine - Supp K and Mag 5.  Atrial fibrillation: Paroxysmal.   - remains in NSR today - Continue amiodarone  as above.  - Eliquis  converted to heparin  gtt in anticipation of surgery - PharmD managing heparin   6. Meningioma: Monitoring.  7. Prostate cancer: Treated with radiation.  8. Hypokalemia/hypomagnesemia - supp K and mag  CRITICAL CARE Performed  by: COLLETTA SHAVER N   Total critical care time: 20 minutes  Critical care time was exclusive of separately billable procedures and treating other patients.  Critical care was necessary to treat or prevent imminent or life-threatening deterioration.  Critical care was time spent personally by me on the following activities: development of treatment plan with patient and/or surrogate as well as nursing, discussions with consultants, evaluation of patient's response to treatment, examination of patient, obtaining history from patient or surrogate, ordering and performing treatments and interventions, ordering and review of laboratory studies, ordering and review of radiographic studies, pulse oximetry and re-evaluation of patient's condition.   Length of Stay: 4  FINCH, LINDSAY N, PA-C  10/05/2024, 7:06 AM  Advanced Heart Failure Team Pager [phone] (M-F; 7a - 5p)   Please visit [email or web address]: For overnight coverage please call cardiology fellow first. If fellow not available call Shock/ECMO MD on call.  For ECMO /  Mechanical Support (Impella, IABP, LVAD) issues call Shock / ECMO MD on call.    Agree with above  Remains on milrinone  and NE and lasix  gtt. Diuresed well but wedge and PA pressures are up today  Swan numbers done personally   PAP: (65-136)/(17-75) 65/19 CVP:  [0 mmHg-26 mmHg] 3 mmHg PCWP:  [37 mmHg] 37 mmHg CO:  [3.8 L/min-5.2 L/min] 4 L/min CI:  [1.6 L/min/m2-2.2 L/min/m2] 1.7 L/min/m2  General:  Sitting up in chair No resp difficulty HEENT: normal Neck: supple. RIJ swan  Cor: Regular rate & rhythm.+ s3 Lungs: clear Abdomen: soft, nontender, nondistended.Good bowel sounds. Extremities: no cyanosis, clubbing, rash, 2+ edema Neuro: alert & orientedx3, cranial nerves grossly intact. moves all 4 extremities w/o difficulty. Affect pleasant  Hemodynamics worse today despite dual inotropes. He is not ready for VAD.   I d/w Dr. Lucas. Will need Impella 5.5 to optimize  prior to VAD. Plan for OR tomorrow.   Will adjust inotropes as above and continue attempts at diuresis.   CRITICAL CARE Performed by: Cherrie Sieving  Total critical care time: 45 minutes  Critical care time was exclusive of separately billable procedures and treating other patients.  Critical care was necessary to treat or prevent imminent or life-threatening deterioration.  Critical care was time spent personally by me (independent of midlevel providers or residents) on the following activities: development of treatment plan with patient and/or surrogate as well as nursing, discussions with consultants, evaluation of patient's response to treatment, examination of patient, obtaining history from patient or surrogate, ordering and performing treatments and interventions, ordering and review of laboratory studies, ordering and review of radiographic studies, pulse oximetry and re-evaluation of patient's condition.  Sieving Cherrie, MD  6:10 PM   "

## 2024-10-05 NOTE — TOC Progression Note (Signed)
 Transition of Care [facility]) - Progression Note    Patient Details  Name: James [name] MRN: [id number] Date of Birth: 1961/03/26  [facility] CM/SW Contact  Justina Delcia Czar, RN Phone Number: [phone] 10/05/2024, 3:53 PM  Clinical Narrative:     Spoke to pt at bedside. Pt remains on Milrinone  and heparin  gtt. Pt being optimized preVAD.   Chart reviewed for discharge readiness, patient not medically stable for d/c. Inpatient CM/CSW will continue to monitor pt's advancement through interdisciplinary progression rounds.   If new pt transition needs arise, MD please place a TOC consult.     Expected Discharge Plan: IP Rehab Facility Barriers to Discharge: Continued Medical Work up    Expected Discharge Plan and Services   Discharge Planning Services: CM Consult Post Acute Care Choice: Home Health Living arrangements for the past 2 months: Single Family Home                    Social Drivers of Health (SDOH) Interventions SDOH Screenings   Food Insecurity: No Food Insecurity (07/21/2024)  Housing: Low Risk (07/21/2024)  Transportation Needs: No Transportation Needs (07/21/2024)  Utilities: Not At Risk (07/21/2024)  Alcohol Screen: Low Risk (11/18/2023)  Depression (PHQ2-9): Low Risk (08/21/2024)  Tobacco Use: Medium Risk (09/17/2024)    Readmission Risk Interventions    07/10/2024   11:05 AM 10/10/2023   10:37 AM  Readmission Risk Prevention Plan  Transportation Screening Complete Complete  Home Care Screening Complete Complete  Medication Review (RN CM) Complete Complete

## 2024-10-05 NOTE — Progress Notes (Signed)
 ANTICOAGULATION CONSULT NOTE  Pharmacy Consult for heparin   Indication: atrial fibrillation (Eliquis  PTA)  Allergies[1]  Patient Measurements: Height: 6' 6 (198.1 cm) Weight: 102.3 kg (225 lb 8.5 oz) IBW/kg (Calculated) : 91.4 Heparin  Dosing Weight: 104 kg   Vital Signs: Temp: 98.1 F (36.7 C) (12/22 1300) Temp Source: Core (12/22 0800) BP: 148/123 (12/22 1300) Pulse Rate: 73 (12/22 1300)  Labs: Recent Labs    10/03/24 0407 10/03/24 1205 10/04/24 0451 10/04/24 1800 10/05/24 0517 10/05/24 1445  HGB 14.8  --  14.7  --  14.4  --   HCT 43.8  --  42.8  --  41.4  --   PLT 156  --  129*  --  117*  --   APTT 110*   < > 116*  --  62* 66*  HEPARINUNFRC >1.10*  --  0.93*  --  0.55  --   CREATININE 1.25*  --  1.19 1.35* 1.23  --    < > = values in this interval not displayed.    Estimated Creatinine Clearance: 79.5 mL/min (by C-G formula based on SCr of 1.23 mg/dL).  Medical History: Past Medical History:  Diagnosis Date   CKD (chronic kidney disease) stage 2, GFR 60-89 ml/min    COPD (chronic obstructive pulmonary disease) (HCC)    Diabetes mellitus type II, non insulin  dependent (HCC)    Dilated aortic root    Elevated PSA    Hypercholesteremia    Hypertension    NICM (nonischemic cardiomyopathy) (HCC)    NSVT (nonsustained ventricular tachycardia) (HCC)    Obstructive sleep apnea    Paroxysmal atrial fibrillation (HCC)    Pulmonary hypertension (HCC)    PVC's (premature ventricular contractions)    Tobacco abuse      Assessment: 65 yoM presents for RHC before planned LVAD implant 12/23.  Pharmacy consulted for heparin  dosing while apixaban  on hold.  Heparin  level still slightly elevated from recent DOAC, aPTT slightly below goal.  No overt bleeding or complications noted.  CBC stable.  Goal of Therapy:  Heparin  level 0.3-0.7 units/ml aPTT 66-102 seconds Monitor platelets by anticoagulation protocol: Yes   Plan:  Continue IV heparin  at 1550  units/hr. Daily heparin  level, aPTT, CBC  Harlene Barlow, [employer], [facility] Clinical Pharmacist  10/05/2024 3:48 PM   [facility] pharmacy phone numbers are listed on [email or web address]           [1] No Known Allergies

## 2024-10-05 NOTE — Progress Notes (Signed)
 ANTICOAGULATION CONSULT NOTE  Pharmacy Consult for heparin   Indication: atrial fibrillation (Eliquis  PTA)  Allergies[1]  Patient Measurements: Height: 6' 6 (198.1 cm) Weight: 102.3 kg (225 lb 8.5 oz) IBW/kg (Calculated) : 91.4 Heparin  Dosing Weight: 104 kg   Vital Signs: Temp: 98.2 F (36.8 C) (12/22 0700) Temp Source: Core (12/22 0400) BP: 125/77 (12/22 0600) Pulse Rate: 68 (12/22 0700)  Labs: Recent Labs    10/03/24 0407 10/03/24 1205 10/04/24 0451 10/04/24 1800 10/05/24 0517  HGB 14.8  --  14.7  --  14.4  HCT 43.8  --  42.8  --  41.4  PLT 156  --  129*  --  117*  APTT 110* 91* 116*  --   --   HEPARINUNFRC >1.10*  --  0.93*  --  0.55  CREATININE 1.25*  --  1.19 1.35* 1.23    Estimated Creatinine Clearance: 79.5 mL/min (by C-G formula based on SCr of 1.23 mg/dL).  Medical History: Past Medical History:  Diagnosis Date   CKD (chronic kidney disease) stage 2, GFR 60-89 ml/min    COPD (chronic obstructive pulmonary disease) (HCC)    Diabetes mellitus type II, non insulin  dependent (HCC)    Dilated aortic root    Elevated PSA    Hypercholesteremia    Hypertension    NICM (nonischemic cardiomyopathy) (HCC)    NSVT (nonsustained ventricular tachycardia) (HCC)    Obstructive sleep apnea    Paroxysmal atrial fibrillation (HCC)    Pulmonary hypertension (HCC)    PVC's (premature ventricular contractions)    Tobacco abuse      Assessment: 50 yoM presents for RHC before planned LVAD implant 12/23.  Pharmacy consulted for heparin  dosing while apixaban  on hold.  Heparin  level still slightly elevated from recent DOAC, aPTT slightly below goal.  No overt bleeding or complications noted.  CBC stable.  Goal of Therapy:  Heparin  level 0.3-0.7 units/ml aPTT 66-102 seconds Monitor platelets by anticoagulation protocol: Yes   Plan:  Increase IV heparin  slightly to 1550 units/hr. Repeat aPTT this afternoon. Daily heparin  level, aPTT, CBC  Harlene Barlow, Berdine [employer], [facility] Clinical Pharmacist  10/05/2024 7:31 AM   [facility] pharmacy phone numbers are listed on [email or web address]          [1] No Known Allergies

## 2024-10-05 NOTE — Progress Notes (Signed)
.. ° ° °  PROCEDURAL EXPEDITER PROGRESS NOTE  Patient Name: James [name]  DOB:1961/02/19 Date of Admission: 10/01/2024  Date of Assessment:10/05/2024   -------------------------------------------------------------------------------------------------------------------   Brief clinical summary: Pt to OR tomorrow for placement of axillary left ventricular assist device   Orders in place:  Yes   Communication with surgical team if no orders: N/A  Labs, test, and orders reviewed: Y  Requires surgical clearance:  No  Barriers noted: N/A   Intervention provided by [facility] team: N/A  Barrier resolved:  not applicable   -------------------------------------------------------------------------------------------------------------------  [employer], [place], [place] Please contact us  directly via secure chat (search for [facility]) or by calling us  at [phone] [facility]).

## 2024-10-06 ENCOUNTER — Inpatient Hospital Stay (HOSPITAL_COMMUNITY): Payer: Self-pay | Admitting: Certified Registered Nurse Anesthetist

## 2024-10-06 ENCOUNTER — Inpatient Hospital Stay (HOSPITAL_COMMUNITY)

## 2024-10-06 ENCOUNTER — Encounter (HOSPITAL_COMMUNITY): Payer: Self-pay | Admitting: Certified Registered Nurse Anesthetist

## 2024-10-06 ENCOUNTER — Encounter (HOSPITAL_COMMUNITY)
Admission: AD | Disposition: A | Discharge status: Rehabilitation Facility | Payer: Self-pay | Source: Home / Self Care | Attending: Cardiology

## 2024-10-06 DIAGNOSIS — R570 Cardiogenic shock: Secondary | ICD-10-CM | POA: Diagnosis not present

## 2024-10-06 DIAGNOSIS — R0989 Other specified symptoms and signs involving the circulatory and respiratory systems: Secondary | ICD-10-CM | POA: Diagnosis not present

## 2024-10-06 DIAGNOSIS — I517 Cardiomegaly: Secondary | ICD-10-CM | POA: Diagnosis not present

## 2024-10-06 DIAGNOSIS — Z95811 Presence of heart assist device: Secondary | ICD-10-CM

## 2024-10-06 DIAGNOSIS — I5023 Acute on chronic systolic (congestive) heart failure: Secondary | ICD-10-CM | POA: Diagnosis not present

## 2024-10-06 HISTORY — PX: INTRAOPERATIVE TRANSESOPHAGEAL ECHOCARDIOGRAM: SHX5062

## 2024-10-06 HISTORY — PX: PLACEMENT OF IMPELLA LEFT VENTRICULAR ASSIST DEVICE: SHX6519

## 2024-10-06 LAB — BASIC METABOLIC PANEL WITH GFR
Anion gap: 12 (ref 5–15)
BUN: 18 mg/dL (ref 8–23)
CO2: 32 mmol/L (ref 22–32)
Calcium: 9.6 mg/dL (ref 8.9–10.3)
Chloride: 83 mmol/L — ABNORMAL LOW (ref 98–111)
Creatinine, Ser: 1.34 mg/dL — ABNORMAL HIGH (ref 0.61–1.24)
GFR, Estimated: 60 mL/min — ABNORMAL LOW
Glucose, Bld: 129 mg/dL — ABNORMAL HIGH (ref 70–99)
Potassium: 3.3 mmol/L — ABNORMAL LOW (ref 3.5–5.1)
Sodium: 127 mmol/L — ABNORMAL LOW (ref 135–145)

## 2024-10-06 LAB — LACTATE DEHYDROGENASE: LDH: 920 U/L — ABNORMAL HIGH (ref 105–235)

## 2024-10-06 LAB — GLUCOSE, CAPILLARY
Glucose-Capillary: 131 mg/dL — ABNORMAL HIGH (ref 70–99)
Glucose-Capillary: 187 mg/dL — ABNORMAL HIGH (ref 70–99)
Glucose-Capillary: 205 mg/dL — ABNORMAL HIGH (ref 70–99)
Glucose-Capillary: 163 mg/dL — ABNORMAL HIGH (ref 70–99)

## 2024-10-06 LAB — CBC
HCT: 44.1 % (ref 39.0–52.0)
Hemoglobin: 15.3 g/dL (ref 13.0–17.0)
MCH: 31.5 pg (ref 26.0–34.0)
MCHC: 34.7 g/dL (ref 30.0–36.0)
MCV: 90.7 fL (ref 80.0–100.0)
Platelets: 101 K/uL — ABNORMAL LOW (ref 150–400)
RBC: 4.86 MIL/uL (ref 4.22–5.81)
RDW: 16 % — ABNORMAL HIGH (ref 11.5–15.5)
WBC: 7.4 K/uL (ref 4.0–10.5)
nRBC: 0 % (ref 0.0–0.2)

## 2024-10-06 LAB — MAGNESIUM: Magnesium: 2.1 mg/dL (ref 1.7–2.4)

## 2024-10-06 LAB — APTT: aPTT: 70 s — ABNORMAL HIGH (ref 24–36)

## 2024-10-06 LAB — COOXEMETRY PANEL
Carboxyhemoglobin: 1.6 % — ABNORMAL HIGH (ref 0.5–1.5)
Methemoglobin: <0.7 % (ref 0.0–1.5)
O2 Saturation: 67.7 %
Total hemoglobin: 15.6 g/dL (ref 12.0–16.0)

## 2024-10-06 LAB — ECHO INTRAOPERATIVE TEE
Height: 78 in
Weight: 3428.59 [oz_av]

## 2024-10-06 LAB — HEPARIN LEVEL (UNFRACTIONATED): Heparin Unfractionated: 0.56 [IU]/mL (ref 0.30–0.70)

## 2024-10-06 MED ORDER — ACETAZOLAMIDE 250 MG PO TABS
500.0000 mg | ORAL_TABLET | Freq: Once | ORAL | Status: AC
  Administered 2024-10-06: 500 mg via ORAL
  Filled 2024-10-06: qty 2

## 2024-10-06 MED ORDER — POTASSIUM CHLORIDE 20 MEQ PO PACK
40.0000 meq | PACK | ORAL | Status: AC
  Administered 2024-10-06 (×2): 40 meq via ORAL
  Filled 2024-10-06 (×2): qty 2

## 2024-10-06 MED ORDER — HYDRALAZINE HCL 20 MG/ML IJ SOLN
INTRAMUSCULAR | Status: DC | PRN
  Administered 2024-10-06: 10 mg via INTRAVENOUS

## 2024-10-06 MED ORDER — HEPARIN 6000 UNIT IRRIGATION SOLUTION
Status: DC | PRN
  Administered 2024-10-06: 1

## 2024-10-06 MED ORDER — FENTANYL CITRATE (PF) 250 MCG/5ML IJ SOLN
INTRAMUSCULAR | Status: AC
  Filled 2024-10-06: qty 5

## 2024-10-06 MED ORDER — PROPOFOL 10 MG/ML IV BOLUS
INTRAVENOUS | Status: AC
  Filled 2024-10-06: qty 20

## 2024-10-06 MED ORDER — SODIUM CHLORIDE 0.9% IV SOLUTION: INTRAVENOUS | Status: DC | PRN

## 2024-10-06 MED ORDER — FENTANYL CITRATE (PF) 250 MCG/5ML IJ SOLN
INTRAMUSCULAR | Status: DC | PRN
  Administered 2024-10-06 (×2): 25 ug via INTRAVENOUS
  Administered 2024-10-06: 50 ug via INTRAVENOUS
  Administered 2024-10-06: 25 ug via INTRAVENOUS
  Administered 2024-10-06: 50 ug via INTRAVENOUS
  Administered 2024-10-06: 25 ug via INTRAVENOUS

## 2024-10-06 MED ORDER — METOLAZONE 5 MG PO TABS
5.0000 mg | ORAL_TABLET | Freq: Two times a day (BID) | ORAL | Status: AC
  Administered 2024-10-06 (×2): 5 mg via ORAL
  Filled 2024-10-06 (×2): qty 1

## 2024-10-06 MED ORDER — PROPOFOL 10 MG/ML IV BOLUS
INTRAVENOUS | Status: DC | PRN
  Administered 2024-10-06: 10 mg via INTRAVENOUS
  Administered 2024-10-06: 20 mg via INTRAVENOUS
  Administered 2024-10-06: 30 mg via INTRAVENOUS

## 2024-10-06 MED ORDER — MIDAZOLAM HCL 2 MG/2ML IJ SOLN
INTRAMUSCULAR | Status: AC
  Filled 2024-10-06: qty 2

## 2024-10-06 MED ORDER — ONDANSETRON HCL 4 MG/2ML IJ SOLN
INTRAMUSCULAR | Status: DC | PRN
  Administered 2024-10-06: 4 mg via INTRAVENOUS

## 2024-10-06 MED ORDER — SUGAMMADEX SODIUM 200 MG/2ML IV SOLN
INTRAVENOUS | Status: DC | PRN
  Administered 2024-10-06 (×2): 100 mg via INTRAVENOUS

## 2024-10-06 MED ORDER — FUROSEMIDE 10 MG/ML IJ SOLN
80.0000 mg | Freq: Once | INTRAMUSCULAR | Status: AC
  Administered 2024-10-06: 80 mg via INTRAVENOUS
  Filled 2024-10-06: qty 8

## 2024-10-06 MED ORDER — HEPARIN 6000 UNIT IRRIGATION SOLUTION
Status: AC
  Filled 2024-10-06: qty 500

## 2024-10-06 MED ORDER — LACTATED RINGERS IV SOLN: INTRAVENOUS | Status: DC | PRN

## 2024-10-06 MED ORDER — FUROSEMIDE 10 MG/ML IJ SOLN
15.0000 mg/h | INTRAVENOUS | Status: DC
  Administered 2024-10-06 – 2024-10-07 (×2): 15 mg/h via INTRAVENOUS
  Filled 2024-10-06 (×2): qty 20

## 2024-10-06 MED ORDER — MIDAZOLAM HCL (PF) 2 MG/2ML IJ SOLN
INTRAMUSCULAR | Status: DC | PRN
  Administered 2024-10-06 (×2): 2 mg via INTRAVENOUS

## 2024-10-06 MED ORDER — HEMOSTATIC AGENTS (NO CHARGE) OPTIME
TOPICAL | Status: DC | PRN
  Administered 2024-10-06: 1 via TOPICAL

## 2024-10-06 MED ORDER — PROTAMINE SULFATE 10 MG/ML IV SOLN
INTRAVENOUS | Status: DC | PRN
  Administered 2024-10-06: 50 mg via INTRAVENOUS

## 2024-10-06 MED ORDER — 0.9 % SODIUM CHLORIDE (POUR BTL) OPTIME
TOPICAL | Status: DC | PRN
  Administered 2024-10-06: 3000 mL

## 2024-10-06 MED ORDER — POTASSIUM CHLORIDE 20 MEQ PO PACK
40.0000 meq | PACK | Freq: Once | ORAL | Status: AC
  Administered 2024-10-06: 40 meq via ORAL
  Filled 2024-10-06: qty 2

## 2024-10-06 MED ORDER — SODIUM BICARBONATE 8.4 % IV SOLN
INTRAVENOUS | Status: DC
  Filled 2024-10-06 (×2): qty 25

## 2024-10-06 MED ORDER — CEFAZOLIN SODIUM-DEXTROSE 2-3 GM-%(50ML) IV SOLR
INTRAVENOUS | Status: DC | PRN
  Administered 2024-10-06: 2 g via INTRAVENOUS

## 2024-10-06 MED ORDER — SODIUM BICARBONATE 8.4 % IV SOLN
INTRAVENOUS | Status: DC
  Filled 2024-10-06: qty 25

## 2024-10-06 MED ORDER — ROCURONIUM BROMIDE 10 MG/ML (PF) SYRINGE
PREFILLED_SYRINGE | INTRAVENOUS | Status: DC | PRN
  Administered 2024-10-06: 100 mg via INTRAVENOUS

## 2024-10-06 MED ORDER — HEPARIN SODIUM (PORCINE) 1000 UNIT/ML IJ SOLN
INTRAMUSCULAR | Status: DC | PRN
  Administered 2024-10-06: 10000 [IU] via INTRAVENOUS

## 2024-10-06 MED ORDER — LIDOCAINE 2% (20 MG/ML) 5 ML SYRINGE
INTRAMUSCULAR | Status: DC | PRN
  Administered 2024-10-06: 100 mg via INTRAVENOUS

## 2024-10-06 MED ORDER — POTASSIUM CHLORIDE CRYS ER 20 MEQ PO TBCR
40.0000 meq | EXTENDED_RELEASE_TABLET | ORAL | Status: DC
  Filled 2024-10-06: qty 2

## 2024-10-06 MED ORDER — DEXAMETHASONE SOD PHOSPHATE PF 10 MG/ML IJ SOLN
INTRAMUSCULAR | Status: DC | PRN
  Administered 2024-10-06: 10 mg via INTRAVENOUS

## 2024-10-06 MED ORDER — SODIUM BICARBONATE 8.4 % IV SOLN: INTRAVENOUS | Status: DC

## 2024-10-06 NOTE — Plan of Care (Signed)
" °  Problem: Education: Goal: Understanding of CV disease, CV risk reduction, and recovery process will improve Outcome: Progressing   Problem: Activity: Goal: Ability to return to baseline activity level will improve Outcome: Progressing   Problem: Education: Goal: Knowledge of General Education information will improve Description: Including pain rating scale, medication(s)/side effects and non-pharmacologic comfort measures Outcome: Progressing   Problem: Elimination: Goal: Will not experience complications related to urinary retention Outcome: Progressing   Problem: Pain Managment: Goal: General experience of comfort will improve and/or be controlled Outcome: Progressing   Problem: Safety: Goal: Ability to remain free from injury will improve Outcome: Progressing   Problem: Fluid Volume: Goal: Ability to maintain a balanced intake and output will improve Outcome: Progressing   "

## 2024-10-06 NOTE — Progress Notes (Addendum)
 "    Advanced Heart Failure Rounding Note  Cardiologist: Maude Emmer, MD  [facility] Cardiologist: Dr. Zenaida  Chief Complaint: Chronic systolic heart failure  Patient Profile   James [name] is a 63 y.o. male with history of stage D cardiomyopathy/chronic systolic heart failure now end-stage w/ inotrope dependence, CKD IIIa, hx VT, PAF, prostate cancer, meningioma.    Admitted for LVAD implant.  Significant events:   12/18: Milrinone  titrated from 0.25 to 0.5 mcg/kg/min post RHC d/t low-output and pulmonary hypertension 12/19: NE added 12/22: Markedly elevated PA pressures, elevated PCWP and elevated SVR. Titrated off NE. Added DBA.   Subjective:    Currently on 0.5 Milrinone  + 2.5 DBA.  Swan #s PA 61/28 CVP 7 TD CO 4.7 TD CI 1.99 Co-ox 68%  Diuresed 6.6L last 24 hrs with lasix  gtt at 20 mg/hr + 500 mg po diamox  + 5 mg metolazone  BID.  Feels great!  No complaints this am.   Objective:   Weight Range: 97.2 kg Body mass index is 24.76 kg/m.   Vital Signs:   Temp:  [97.5 F (36.4 C)-98.6 F (37 C)] 97.7 F (36.5 C) (12/23 0700) Pulse Rate:  [53-89] 74 (12/23 0700) Resp:  [10-26] 17 (12/23 0700) BP: (104-148)/(63-123) 126/77 (12/23 0700) SpO2:  [82 %-100 %] 98 % (12/23 0700) Weight:  [97.2 kg] 97.2 kg (12/23 0500) Last BM Date : 10/03/24  Weight change: Filed Weights   10/04/24 0920 10/05/24 0500 10/06/24 0500  Weight: 106 kg 102.3 kg 97.2 kg    Intake/Output:   Intake/Output Summary (Last 24 hours) at 10/06/2024 0711 Last data filed at 10/06/2024 0700 Gross per 24 hour  Intake 1809.49 ml  Output 6650 ml  Net -4840.51 ml     Physical Exam   General:  Sitting up in bed.  Neck: R internal jugular PA catheter Cor: Regular rate & rhythm. No murmurs. Lungs: clear Abdomen: soft, nontender, nondistended. Extremities: no edema Neuro: alert & orientedx3. Affect pleasant    Telemetry   SR 60s, frequent PVCs and short runs NSVT  Labs    CBC Recent Labs    10/05/24 0517 10/06/24 0450  WBC 7.2 7.4  HGB 14.4 15.3  HCT 41.4 44.1  MCV 90.8 90.7  PLT 117* 101*   Basic Metabolic Panel Recent Labs    87/77/74 0517 10/05/24 1747 10/06/24 0450  NA 128* 127* 127*  K 3.2* 3.9 3.3*  CL 86* 85* 83*  CO2 30 30 32  GLUCOSE 133* 150* 129*  BUN 18 19 18   CREATININE 1.23 1.41* 1.34*  CALCIUM  8.8* 9.3 9.6  MG 1.7  --  2.1   Liver Function Tests No results for input(s): AST, ALT, ALKPHOS, BILITOT, PROT, ALBUMIN  in the last 72 hours.  No results for input(s): LIPASE, AMYLASE in the last 72 hours. Cardiac Enzymes No results for input(s): CKTOTAL, CKMB, CKMBINDEX, TROPONINI in the last 72 hours.  BNP: BNP (last 3 results) Recent Labs    06/10/24 1213 07/09/24 1649 07/23/24 0959  BNP 1,071.5* 3,316.0* 595.8*    ProBNP (last 3 results) Recent Labs    10/01/24 1030  PROBNP 8,374.0*     D-Dimer No results for input(s): DDIMER in the last 72 hours. Hemoglobin A1C No results for input(s): HGBA1C in the last 72 hours. Fasting Lipid Panel No results for input(s): CHOL, HDL, LDLCALC, TRIG, CHOLHDL, LDLDIRECT in the last 72 hours. Medications:   Scheduled Medications:  atorvastatin   40 mg Oral Daily   calcium  carbonate  1 tablet Oral BID WC   Chlorhexidine  Gluconate Cloth  6 each Topical Daily   digoxin   0.125 mg Oral QODAY   insulin  aspart  0-15 Units Subcutaneous TID WC   mexiletine  200 mg Oral Q8H   polyethylene glycol  17 g Oral Daily   sildenafil   20 mg Oral TID   sodium bicarbonate  25 mEq (Impella PURGE) in dextrose  5 % 1000 mL bag   Intracatheter To OR   sodium chloride  flush  10-40 mL Intracatheter Q12H   sodium chloride  flush  3 mL Intravenous Q12H   spironolactone   25 mg Oral QHS    Infusions:  amiodarone  30 mg/hr (10/06/24 0700)   DOBUTamine  2.5 mcg/kg/min (10/06/24 0700)   furosemide  (LASIX ) 200 mg in dextrose  5 % 100 mL (2 mg/mL) infusion 20 mg/hr  (10/06/24 0700)   heparin  1,550 Units/hr (10/06/24 0700)   milrinone  0.5 mcg/kg/min (10/06/24 0700)   norepinephrine  (LEVOPHED ) Adult infusion Stopped (10/05/24 1305)    PRN Medications: acetaminophen , acetaminophen , fentaNYL  (SUBLIMAZE ) injection, ondansetron  (ZOFRAN ) IV, ondansetron , mouth rinse, sodium chloride  flush, sodium chloride  flush  Assessment/Plan   1. Acute on chronic systolic CHF: Nonischemic cardiomyopathy.  Last echo in 9/25 with EF 10-15%, severe LV dilation, mild RVE with mild-moderate RV dilation, moderate MR, moderate TR, dilated IVC. H/o low output HF, has been on home milrinone  0.25.  Extensive discussion recently re: transplant vs LVAD.  Finally, it appears that patient has settled on LVAD as his preference.  He was brought in for hemodynamic optimization prior to LVAD.  RHC 12/18 showed elevated filling pressures with mod-severe mixed pulmonary arterial/pulmonary venous hypertension and low CO despite milrinone  0.25.  - Currently on milrinone  at 0.5 and DBA at 2.5. Co-ox stable and CI 1.99. PA pressures improving with diuresis but still elevated.  - Continue diuresis today with lasix  gtt, BID metolazone  and diamox . - Tentatively scheduled for Impella 5.5 today - Holding GDMT pre-op - Dig level 1.4, decreased to 0.125 mg qod - Tentatively scheduled for LVAD on 12/23. Would like to see pulmonary pressures come down pre-op. Will discuss with Dr. Yves Fodor 2. Pulmonary hypertension: Moderate-severe mixed pulmonary arterial/pulmonary venous hypertension on RHC 12/18.  - Continue milrinone  - PA systolic pressures improved to 60s with diuresis - Continue to push diuresis today - Currently on sildenafil  20 TID 3.  AKI on CKD stage 3: Scr 1.7>>1.3 - Likely cardiorenal syndrome. Improved with inotrope support and diuresis 4.  PVCs/NSVT: Has Biotronik ICD.   - Continue IV amio and mexiletine - Exacerbated by dual inotrope support - Supp K and Mag 5.  Atrial fibrillation:  Paroxysmal.   - remains in NSR today - Continue amiodarone  as above.  - Eliquis  converted to heparin  gtt in anticipation of surgery - PharmD managing heparin   6. Meningioma: Monitoring.  7. Prostate cancer: Treated with radiation.  8. Hypokalemia/hypomagnesemia - supp K today  Discussed with Dr. Hayes Rehfeldt at bedside  CRITICAL CARE Performed by: COLLETTA MANUELITA SAILOR   Total critical care time: 20 minutes  Critical care time was exclusive of separately billable procedures and treating other patients.  Critical care was necessary to treat or prevent imminent or life-threatening deterioration.  Critical care was time spent personally by me on the following activities: development of treatment plan with patient and/or surrogate as well as nursing, discussions with consultants, evaluation of patient's response to treatment, examination of patient, obtaining history from patient or surrogate, ordering and performing treatments and interventions, ordering and review of laboratory studies, ordering  and review of radiographic studies, pulse oximetry and re-evaluation of patient's condition.     Length of Stay: 5  FINCH, LINDSAY N, PA-C  10/06/2024, 7:11 AM  Advanced Heart Failure Team Pager [phone] (M-F; 7a - 5p)   Please visit [email or web address]: For overnight coverage please call cardiology fellow first. If fellow not available call Shock/ECMO MD on call.  For ECMO / Mechanical Support (Impella, IABP, LVAD) issues call Shock / ECMO MD on call.    Agree with above  Inotropes changed yesterday. Started on lasix  gtt. Has diuresed well but hemodynamics still tenuous (I checked personally)  PA 68/30  PCWP 28 CI 1.33  General:  Sitting up in bed. No resp difficulty HEENT: normal Neck: supple. RIJ swan Cor: Regular rate & rhythm. No rubs, gallops or murmurs. Lungs: clear Abdomen: soft, nontender, nondistended.Good bowel sounds. Extremities: no cyanosis, clubbing, rash, tr edema Neuro: alert &  orientedx3, cranial nerves grossly intact. moves all 4 extremities w/o difficulty. Affect pleasant  Hemodynamics still remain marginal. I d/w TCTS at bedside and we agree that best plan would be to optimize with Impella 5.5 prior to durable VAD placement. Will proceed to OR today.   CRITICAL CARE Performed by: Cherrie Sieving  Total critical care time: 37 minutes  Critical care time was exclusive of separately billable procedures and treating other patients.  Critical care was necessary to treat or prevent imminent or life-threatening deterioration.  Critical care was time spent personally by me (independent of midlevel providers or residents) on the following activities: development of treatment plan with patient and/or surrogate as well as nursing, discussions with consultants, evaluation of patient's response to treatment, examination of patient, obtaining history from patient or surrogate, ordering and performing treatments and interventions, ordering and review of laboratory studies, ordering and review of radiographic studies, pulse oximetry and re-evaluation of patient's condition.  Sieving Cherrie, MD  8:35 AM   "

## 2024-10-06 NOTE — Anesthesia Preprocedure Evaluation (Signed)
"                                    Anesthesia Evaluation  Patient identified by MRN, date of birth, ID band Patient awake    Reviewed: Allergy & Precautions, NPO status , Patient's Chart, lab work & pertinent test results, reviewed documented beta blocker date and time   History of Anesthesia Complications Negative for: history of anesthetic complications  Airway Mallampati: III  TM Distance: >3 FB     Dental no notable dental hx.    Pulmonary asthma , sleep apnea , COPD, former smoker   breath sounds clear to auscultation       Cardiovascular hypertension, +CHF and + DOE  + pacemaker  Rhythm:Regular Rate:Tachycardia  IMPRESSIONS     1. Left ventricular ejection fraction, by estimation, is 10-15%. The left  ventricle demonstrates global hypokinesis. The left ventricular internal  cavity size was severely dilated. Left ventricular diastolic parameters  are indeterminate.   2. Right ventricular systolic function mild to moderately reduced. The  right ventricular size is mildly enlarged. There is moderately elevated  pulmonary artery systolic pressure.   3. Moderate mitral valve regurgitation. No evidence of mitral stenosis.   4. The tricuspid valve is abnormal. Tricuspid valve regurgitation is  moderate.   5. The inferior vena cava is dilated in size with <50% respiratory  variability, suggesting right atrial pressure of 15 mmHg.   6. Limited echo evaluate LV and RV function.     Neuro/Psych neg Seizures    GI/Hepatic ,,,(+) neg Cirrhosis        Endo/Other  diabetes, Type 2    Renal/GU CRFRenal disease     Musculoskeletal   Abdominal   Peds  Hematology Mild thrombocytopenia   Anesthesia Other Findings   Reproductive/Obstetrics                              Anesthesia Physical Anesthesia Plan  ASA: 5  Anesthesia Plan: General   Post-op Pain Management:    Induction: Intravenous  PONV Risk Score and Plan: 2  and Ondansetron  and Dexamethasone   Airway Management Planned: Oral ETT  Additional Equipment: Arterial line, TEE, PA Cath and CVP  Intra-op Plan:   Post-operative Plan: Extubation in OR and Possible Post-op intubation/ventilation  Informed Consent: I have reviewed the patients History and Physical, chart, labs and discussed the procedure including the risks, benefits and alternatives for the proposed anesthesia with the patient or authorized representative who has indicated his/her understanding and acceptance.     Dental advisory given  Plan Discussed with: CRNA  Anesthesia Plan Comments:          Anesthesia Quick Evaluation  "

## 2024-10-06 NOTE — Progress Notes (Signed)
 Progress note  Diuresed very well overnight. PA pressures improved, swelling much better as well.   Vitals stable.   Proceed with Impella 5.5 this morning.   Con Clunes, MD

## 2024-10-06 NOTE — Anesthesia Procedure Notes (Signed)
 Procedure Name: Intubation Date/Time: 10/06/2024 9:04 AM  Performed by: Leopoldo Wanda DASEN, CRNAPre-anesthesia Checklist: Patient identified, Emergency Drugs available, Suction available and Patient being monitored Patient Re-evaluated:Patient Re-evaluated prior to induction Oxygen  Delivery Method: Circle System Utilized Preoxygenation: Pre-oxygenation with 100% oxygen  Induction Type: IV induction Ventilation: Mask ventilation without difficulty and Oral airway inserted - appropriate to patient size Laryngoscope Size: Mac and 4 Grade View: Grade III Tube type: Oral Tube size: 7.5 mm Number of attempts: 1 Airway Equipment and Method: Stylet Placement Confirmation: ETT inserted through vocal cords under direct vision, positive ETCO2 and breath sounds checked- equal and bilateral Secured at: 22 cm Tube secured with: Tape Dental Injury: Teeth and Oropharynx as per pre-operative assessment

## 2024-10-06 NOTE — Progress Notes (Signed)
 TCTS Evening Rounds:  Hemodynamics stable since insertion of Impella 5.5 this am. On P9 with 5.2 L flow. Milrinone  down to 0.25 and dobut off. PA pressure dropped some.  Lasix  drip stopped because CVP was low but UO dropped to 75 cc last hour so may need further lasix .   Impella site looks good. He is up in chair and feels well.

## 2024-10-06 NOTE — Anesthesia Postprocedure Evaluation (Signed)
"   Anesthesia Post Note  Patient: James [name]  Procedure(s) Performed: INSERTION, CARDIAC ASSIST DEVICE, IMPELLA 5.5 (Chest) ECHOCARDIOGRAM, TRANSESOPHAGEAL, INTRAOPERATIVE     Patient location during evaluation: ICU Anesthesia Type: General Level of consciousness: awake and alert Pain management: pain level controlled Vital Signs Assessment: post-procedure vital signs reviewed and stable Respiratory status: spontaneous breathing, nonlabored ventilation, respiratory function stable and patient connected to nasal cannula oxygen  Cardiovascular status: blood pressure returned to baseline and stable Postop Assessment: no apparent nausea or vomiting Anesthetic complications: no   No notable events documented.  Last Vitals:  Vitals:   10/06/24 1645 10/06/24 1700  BP:    Pulse:  (!) 143  Resp: 18 17  Temp: 37.6 C 37.7 C  SpO2:  (!) 88%    Last Pain:  Vitals:   10/06/24 0745  TempSrc: Core  PainSc: 0-No pain                 Lynwood MARLA Cornea      "

## 2024-10-06 NOTE — Progress Notes (Addendum)
 Seen on evening rounds.  UOP decreased. Urine blood tinged.  Lab sample, including LDH, hemolyzed.  Discussed with Impella rep at bedside.  Currently at P9 with 5.2L flow. Also on 0.25 milrinone . Last CO 5.06. SVR ~ 1500 and CVP creeping up.  Drop Impella to P7.  Increase milrinone  to 0.5 which will help with afterload. May also need to add hydralazine , MAP has been higher this afternoon.  Give 80 mg lasix  IV and restart lasix  gtt at 15 mg/hr.  Manuelita Dutch, PA-C Advanced Heart Failure

## 2024-10-06 NOTE — H&P (View-Only) (Signed)
 "    Advanced Heart Failure Rounding Note  Cardiologist: Maude Emmer, MD  [facility] Cardiologist: Dr. Zenaida  Chief Complaint: Chronic systolic heart failure  Patient Profile   James [name] is a 63 y.o. male with history of stage D cardiomyopathy/chronic systolic heart failure now end-stage w/ inotrope dependence, CKD IIIa, hx VT, PAF, prostate cancer, meningioma.    Admitted for LVAD implant.  Significant events:   12/18: Milrinone  titrated from 0.25 to 0.5 mcg/kg/min post RHC d/t low-output and pulmonary hypertension 12/19: NE added 12/22: Markedly elevated PA pressures, elevated PCWP and elevated SVR. Titrated off NE. Added DBA.   Subjective:    Currently on 0.5 Milrinone  + 2.5 DBA.  Swan #s PA 61/28 CVP 7 TD CO 4.7 TD CI 1.99 Co-ox 68%  Diuresed 6.6L last 24 hrs with lasix  gtt at 20 mg/hr + 500 mg po diamox  + 5 mg metolazone  BID.  Feels great!  No complaints this am.   Objective:   Weight Range: 97.2 kg Body mass index is 24.76 kg/m.   Vital Signs:   Temp:  [97.5 F (36.4 C)-98.6 F (37 C)] 97.7 F (36.5 C) (12/23 0700) Pulse Rate:  [53-89] 74 (12/23 0700) Resp:  [10-26] 17 (12/23 0700) BP: (104-148)/(63-123) 126/77 (12/23 0700) SpO2:  [82 %-100 %] 98 % (12/23 0700) Weight:  [97.2 kg] 97.2 kg (12/23 0500) Last BM Date : 10/03/24  Weight change: Filed Weights   10/04/24 0920 10/05/24 0500 10/06/24 0500  Weight: 106 kg 102.3 kg 97.2 kg    Intake/Output:   Intake/Output Summary (Last 24 hours) at 10/06/2024 0711 Last data filed at 10/06/2024 0700 Gross per 24 hour  Intake 1809.49 ml  Output 6650 ml  Net -4840.51 ml     Physical Exam   General:  Sitting up in bed.  Neck: R internal jugular PA catheter Cor: Regular rate & rhythm. No murmurs. Lungs: clear Abdomen: soft, nontender, nondistended. Extremities: no edema Neuro: alert & orientedx3. Affect pleasant    Telemetry   SR 60s, frequent PVCs and short runs NSVT  Labs    CBC Recent Labs    10/05/24 0517 10/06/24 0450  WBC 7.2 7.4  HGB 14.4 15.3  HCT 41.4 44.1  MCV 90.8 90.7  PLT 117* 101*   Basic Metabolic Panel Recent Labs    87/77/74 0517 10/05/24 1747 10/06/24 0450  NA 128* 127* 127*  K 3.2* 3.9 3.3*  CL 86* 85* 83*  CO2 30 30 32  GLUCOSE 133* 150* 129*  BUN 18 19 18   CREATININE 1.23 1.41* 1.34*  CALCIUM  8.8* 9.3 9.6  MG 1.7  --  2.1   Liver Function Tests No results for input(s): AST, ALT, ALKPHOS, BILITOT, PROT, ALBUMIN  in the last 72 hours.  No results for input(s): LIPASE, AMYLASE in the last 72 hours. Cardiac Enzymes No results for input(s): CKTOTAL, CKMB, CKMBINDEX, TROPONINI in the last 72 hours.  BNP: BNP (last 3 results) Recent Labs    06/10/24 1213 07/09/24 1649 07/23/24 0959  BNP 1,071.5* 3,316.0* 595.8*    ProBNP (last 3 results) Recent Labs    10/01/24 1030  PROBNP 8,374.0*     D-Dimer No results for input(s): DDIMER in the last 72 hours. Hemoglobin A1C No results for input(s): HGBA1C in the last 72 hours. Fasting Lipid Panel No results for input(s): CHOL, HDL, LDLCALC, TRIG, CHOLHDL, LDLDIRECT in the last 72 hours. Medications:   Scheduled Medications:  atorvastatin   40 mg Oral Daily   calcium  carbonate  1 tablet Oral BID WC   Chlorhexidine  Gluconate Cloth  6 each Topical Daily   digoxin   0.125 mg Oral QODAY   insulin  aspart  0-15 Units Subcutaneous TID WC   mexiletine  200 mg Oral Q8H   polyethylene glycol  17 g Oral Daily   sildenafil   20 mg Oral TID   sodium bicarbonate  25 mEq (Impella PURGE) in dextrose  5 % 1000 mL bag   Intracatheter To OR   sodium chloride  flush  10-40 mL Intracatheter Q12H   sodium chloride  flush  3 mL Intravenous Q12H   spironolactone   25 mg Oral QHS    Infusions:  amiodarone  30 mg/hr (10/06/24 0700)   DOBUTamine  2.5 mcg/kg/min (10/06/24 0700)   furosemide  (LASIX ) 200 mg in dextrose  5 % 100 mL (2 mg/mL) infusion 20 mg/hr  (10/06/24 0700)   heparin  1,550 Units/hr (10/06/24 0700)   milrinone  0.5 mcg/kg/min (10/06/24 0700)   norepinephrine  (LEVOPHED ) Adult infusion Stopped (10/05/24 1305)    PRN Medications: acetaminophen , acetaminophen , fentaNYL  (SUBLIMAZE ) injection, ondansetron  (ZOFRAN ) IV, ondansetron , mouth rinse, sodium chloride  flush, sodium chloride  flush  Assessment/Plan   1. Acute on chronic systolic CHF: Nonischemic cardiomyopathy.  Last echo in 9/25 with EF 10-15%, severe LV dilation, mild RVE with mild-moderate RV dilation, moderate MR, moderate TR, dilated IVC. H/o low output HF, has been on home milrinone  0.25.  Extensive discussion recently re: transplant vs LVAD.  Finally, it appears that patient has settled on LVAD as his preference.  He was brought in for hemodynamic optimization prior to LVAD.  RHC 12/18 showed elevated filling pressures with mod-severe mixed pulmonary arterial/pulmonary venous hypertension and low CO despite milrinone  0.25.  - Currently on milrinone  at 0.5 and DBA at 2.5. Co-ox stable and CI 1.99. PA pressures improving with diuresis but still elevated.  - Continue diuresis today with lasix  gtt, BID metolazone  and diamox . - Tentatively scheduled for Impella 5.5 today - Holding GDMT pre-op - Dig level 1.4, decreased to 0.125 mg qod - Tentatively scheduled for LVAD on 12/23. Would like to see pulmonary pressures come down pre-op. Will discuss with Dr. Yves Fodor 2. Pulmonary hypertension: Moderate-severe mixed pulmonary arterial/pulmonary venous hypertension on RHC 12/18.  - Continue milrinone  - PA systolic pressures improved to 60s with diuresis - Continue to push diuresis today - Currently on sildenafil  20 TID 3.  AKI on CKD stage 3: Scr 1.7>>1.3 - Likely cardiorenal syndrome. Improved with inotrope support and diuresis 4.  PVCs/NSVT: Has [employer] ICD.   - Continue IV amio and mexiletine - Exacerbated by dual inotrope support - Supp K and Mag 5.  Atrial fibrillation:  Paroxysmal.   - remains in NSR today - Continue amiodarone  as above.  - Eliquis  converted to heparin  gtt in anticipation of surgery - PharmD managing heparin   6. Meningioma: Monitoring.  7. Prostate cancer: Treated with radiation.  8. Hypokalemia/hypomagnesemia - supp K today  Discussed with Dr. Hayes Rehfeldt at bedside  CRITICAL CARE Performed by: COLLETTA MANUELITA SAILOR   Total critical care time: 20 minutes  Critical care time was exclusive of separately billable procedures and treating other patients.  Critical care was necessary to treat or prevent imminent or life-threatening deterioration.  Critical care was time spent personally by me on the following activities: development of treatment plan with patient and/or surrogate as well as nursing, discussions with consultants, evaluation of patient's response to treatment, examination of patient, obtaining history from patient or surrogate, ordering and performing treatments and interventions, ordering and review of laboratory studies, ordering  and review of radiographic studies, pulse oximetry and re-evaluation of patient's condition.     Length of Stay: 5  FINCH, LINDSAY N, PA-C  10/06/2024, 7:11 AM  Advanced Heart Failure Team Pager [phone] (M-F; 7a - 5p)   Please visit [email or web address]: For overnight coverage please call cardiology fellow first. If fellow not available call Shock/ECMO MD on call.  For ECMO / Mechanical Support (Impella, IABP, LVAD) issues call Shock / ECMO MD on call.    Agree with above  Inotropes changed yesterday. Started on lasix  gtt. Has diuresed well but hemodynamics still tenuous (I checked personally)  PA 68/30  PCWP 28 CI 1.33  General:  Sitting up in bed. No resp difficulty HEENT: normal Neck: supple. RIJ swan Cor: Regular rate & rhythm. No rubs, gallops or murmurs. Lungs: clear Abdomen: soft, nontender, nondistended.Good bowel sounds. Extremities: no cyanosis, clubbing, rash, tr edema Neuro: alert &  orientedx3, cranial nerves grossly intact. moves all 4 extremities w/o difficulty. Affect pleasant  Hemodynamics still remain marginal. I d/w TCTS at bedside and we agree that best plan would be to optimize with Impella 5.5 prior to durable VAD placement. Will proceed to OR today.   CRITICAL CARE Performed by: Cherrie Sieving  Total critical care time: 37 minutes  Critical care time was exclusive of separately billable procedures and treating other patients.  Critical care was necessary to treat or prevent imminent or life-threatening deterioration.  Critical care was time spent personally by me (independent of midlevel providers or residents) on the following activities: development of treatment plan with patient and/or surrogate as well as nursing, discussions with consultants, evaluation of patient's response to treatment, examination of patient, obtaining history from patient or surrogate, ordering and performing treatments and interventions, ordering and review of laboratory studies, ordering and review of radiographic studies, pulse oximetry and re-evaluation of patient's condition.  Sieving Cherrie, MD  8:35 AM   "

## 2024-10-06 NOTE — Anesthesia Procedure Notes (Addendum)
 Arterial Line Insertion Start/End12/23/2025 8:47 AM, 10/06/2024 8:50 AM Performed by: Keneth Lynwood POUR, MD  Patient location: Pre-op. Preanesthetic checklist: patient identified, IV checked, site marked, risks and benefits discussed, surgical consent, monitors and equipment checked, pre-op evaluation, timeout performed and anesthesia consent Lidocaine  1% used for infiltration Left, radial was placed Catheter size: 20 G Hand hygiene performed  and maximum sterile barriers used   Attempts: 1 Procedure performed using ultrasound to evaluate access site. Ultrasound Notes:relevant anatomy identified, ultrasound used to visualize needle entry, vessel patent under ultrasound and image(s) printed for medical record. Following insertion, dressing applied. Post procedure assessment: normal and unchanged  Patient tolerated the procedure well with no immediate complications.

## 2024-10-06 NOTE — Transfer of Care (Signed)
 Immediate Anesthesia Transfer of Care Note  Patient: James [name]  Procedure(s) Performed: INSERTION, CARDIAC ASSIST DEVICE, IMPELLA 5.5 (Chest) ECHOCARDIOGRAM, TRANSESOPHAGEAL, INTRAOPERATIVE  Patient Location: SICU  Anesthesia Type:General  Level of Consciousness: patient cooperative and responds to stimulation  Airway & Oxygen  Therapy: Patient Spontanous Breathing and Patient connected to nasal cannula oxygen   Post-op Assessment: Report given to RN, Post -op Vital signs reviewed and stable, and Patient moving all extremities X 4  Post vital signs: Reviewed and stable  Last Vitals:  Vitals Value Taken Time  BP Mean 90   Temp    Pulse 57 10/06/24 11:54  Resp 21 10/06/24 11:54  SpO2 99 % 10/06/24 11:54  Vitals shown include unfiled device data.  Last Pain:  Vitals:   10/06/24 0745  TempSrc: Core  PainSc: 0-No pain      Patients Stated Pain Goal: 0 (10/01/24 2000)  Complications: No notable events documented. Impella 5.5 at p9 with flows 5L/m

## 2024-10-06 NOTE — Progress Notes (Signed)
 ANTICOAGULATION CONSULT NOTE  Pharmacy Consult for heparin   Indication: atrial fibrillation (Eliquis  PTA)  Allergies[1]  Patient Measurements: Height: 6' 6 (198.1 cm) Weight: 97.2 kg (214 lb 4.6 oz) IBW/kg (Calculated) : 91.4 Heparin  Dosing Weight: 104 kg   Vital Signs: Temp: 97.9 F (36.6 C) (12/23 1415) Temp Source: Core (12/23 0745) BP: 122/99 (12/23 1400) Pulse Rate: 140 (12/23 1345)  Labs: Recent Labs    10/04/24 0451 10/04/24 1800 10/05/24 0517 10/05/24 1445 10/05/24 1747 10/06/24 0450  HGB 14.7  --  14.4  --   --  15.3  HCT 42.8  --  41.4  --   --  44.1  PLT 129*  --  117*  --   --  101*  APTT 116*  --  62* 66*  --  70*  HEPARINUNFRC 0.93*  --  0.55  --   --  0.56  CREATININE 1.19   < > 1.23  --  1.41* 1.34*   < > = values in this interval not displayed.    Estimated Creatinine Clearance: 72.9 mL/min (A) (by C-G formula based on SCr of 1.34 mg/dL (H)).  Medical History: Past Medical History:  Diagnosis Date   CKD (chronic kidney disease) stage 2, GFR 60-89 ml/min    COPD (chronic obstructive pulmonary disease) (HCC)    Diabetes mellitus type II, non insulin  dependent (HCC)    Dilated aortic root    Elevated PSA    Hypercholesteremia    Hypertension    NICM (nonischemic cardiomyopathy) (HCC)    NSVT (nonsustained ventricular tachycardia) (HCC)    Obstructive sleep apnea    Paroxysmal atrial fibrillation (HCC)    Pulmonary hypertension (HCC)    PVC's (premature ventricular contractions)    Tobacco abuse      Assessment: 59 yoM presents for RHC before planned LVAD implant 12/23.  Pharmacy consulted for heparin  dosing while apixaban  on hold.  Heparin  level this morning within goal range, then heparin  turned off for Impella placement.  No overt bleeding or complications noted.  CBC stable.   Goal of Therapy:  Heparin  level 0.3-0.7 units/ml aPTT 66-102 seconds Monitor platelets by anticoagulation protocol: Yes   Plan:  Discussed with Dr. Daniel,  will keep IV Heparin  off overnight.  F/u resume in AM.  Harlene Barlow, Berdine BIRCH, BCPS, [facility] Clinical Pharmacist  10/06/2024 2:38 PM   [facility] pharmacy phone numbers are listed on [email or web address]            [1] No Known Allergies

## 2024-10-06 NOTE — Progress Notes (Signed)
" °  Echocardiogram Echocardiogram Transesophageal has been performed.  James [name] 10/06/2024, 9:48 AM "

## 2024-10-06 NOTE — Progress Notes (Signed)
 12-lead EKG performed.  Critical value noted.  RN Aliene notified.

## 2024-10-06 NOTE — Op Note (Addendum)
 CARDIOVASCULAR SURGERY OPERATIVE NOTE  10/06/2024   Surgeon:  Con Clunes, MD  First Assistant: Lemond Cera, PA  Experienced assistance was necessary for this case due to surgical complexity. Lemond Cera, [place] assisted with retraction of delicate tissues, exposure, suctioning, and suture management during the anastomosis.  Preoperative Diagnosis:  Cardiogenic shock    Postoperative Diagnosis:  Same   Procedure:  Insertion of Impella 5.5 via right axillary artery.  Anesthesia:  General Endotracheal   Clinical History/Surgical Indication: James [name] is a 63 year old man who was admitted for optimization prior to [facility] implantation.  He has been diuresing but has had very high PA pressures.  Team decided to move forward with Impella 5.5 placement for optimization prior to HM3.  Preparation:  The patient was taken directed from the ICU to the OR. The consent was signed by me. Preoperative antibiotics were given.  After being placed under general endotracheal anesthesia by the anesthesia team the neck and chest were prepped with betadine  soap and solution and draped in the usual sterile manner. A surgical time-out was taken and the correct patient and operative procedure were confirmed with the nursing and anesthesia staff.   Right axillary artery exposure and cannulation:  A transverse incision was made below the right clavicle. The pectoralis major muscle was split along its fibers and the pectoralis minor muscle was retracted laterally. The brachial plexus was identified and gently retracted laterally to expose the axillary artery. The artery was controlled with vessel loops. The patient was heparinized and ACT maintained greater than 250. The axillary artery was clamped proximally and distally with peripheral Debakey clamps. It was opened longitudinally.  A 10 mm Gelweave graft was anastomosed in an end to side manner using continuous 5-0 prolene suture. The graft was then tunneled out through  a counterincision.    Insertion of Impella 5.5:  The peel away sheath was inserted into the end of the graft and fastened with the sleeve connectors to provide hemostasis. A .035 J-wire was advanced through the sheath into the ascending aorta under fluoroscopic guidance usin C-arm. A pig tail catheter was inserted over the wire and then advanced across the aortic valve without difficulty to the LV apex. The J-wire was changed out for 0.18 wire and it was positioned with a gentle curve in the LV apex. The pigtail was removed. Then the graft was clamped adjacent to the anastomosis and the 5.5 Impella inserted through the sheath. Before full insertion it was burped by removing the clamp on the graft temporarily. The Impella was advanced fully into the graft and the clamp removed. The Impella was advanced into the LV and the wire removed. Position was confirmed with TEE with the tip 5 cm below the aortic valve. The graft was again clamped and the graft was cut flush with the skin. The sheath was inserted into the end of the graft and it was fastened with 1-0 silk ties for hemostasis. The sheath was attached to the skin with 0-silk sutures.  The wound was hemostatic. The pectoralis muscle was approximated with continuous 2-0 Vicryl suture. The subcutaneous tissue was approximated with continuous 3-0 Vicryl suture. The skin was approximated with continuous 3-0 Vicryl subcuticular suture.   All sponge, needle, and instrument counts were reported correct at the end of the case. Dry sterile dressings were placed over the incision. The Impella was anchored to the chest wall. The patient was then transported to the surgical intensive care unit in critical but stable condition.

## 2024-10-07 ENCOUNTER — Encounter (HOSPITAL_COMMUNITY): Payer: Self-pay

## 2024-10-07 ENCOUNTER — Encounter (HOSPITAL_COMMUNITY)
Admission: AD | Disposition: A | Discharge status: Rehabilitation Facility | Payer: Self-pay | Source: Home / Self Care | Attending: Cardiology

## 2024-10-07 DIAGNOSIS — R570 Cardiogenic shock: Secondary | ICD-10-CM | POA: Diagnosis not present

## 2024-10-07 DIAGNOSIS — I5041 Acute combined systolic (congestive) and diastolic (congestive) heart failure: Secondary | ICD-10-CM | POA: Diagnosis not present

## 2024-10-07 DIAGNOSIS — Z95811 Presence of heart assist device: Secondary | ICD-10-CM | POA: Diagnosis not present

## 2024-10-07 DIAGNOSIS — Z95810 Presence of automatic (implantable) cardiac defibrillator: Secondary | ICD-10-CM | POA: Diagnosis not present

## 2024-10-07 DIAGNOSIS — I5023 Acute on chronic systolic (congestive) heart failure: Secondary | ICD-10-CM | POA: Diagnosis not present

## 2024-10-07 DIAGNOSIS — N179 Acute kidney failure, unspecified: Secondary | ICD-10-CM | POA: Diagnosis not present

## 2024-10-07 HISTORY — PX: RIGHT HEART CATH: CATH118263

## 2024-10-07 LAB — POCT ACTIVATED CLOTTING TIME
Activated Clotting Time: 143 s
Activated Clotting Time: 271 s
Activated Clotting Time: 143 s
Activated Clotting Time: 220 s

## 2024-10-07 LAB — BASIC METABOLIC PANEL WITH GFR
Anion gap: 12 (ref 5–15)
BUN: 28 mg/dL — ABNORMAL HIGH (ref 8–23)
CO2: 30 mmol/L (ref 22–32)
Calcium: 9.6 mg/dL (ref 8.9–10.3)
Chloride: 81 mmol/L — ABNORMAL LOW (ref 98–111)
Creatinine, Ser: 2.04 mg/dL — ABNORMAL HIGH (ref 0.61–1.24)
GFR, Estimated: 36 mL/min — ABNORMAL LOW
Glucose, Bld: 183 mg/dL — ABNORMAL HIGH (ref 70–99)
Potassium: 3.6 mmol/L (ref 3.5–5.1)
Sodium: 123 mmol/L — ABNORMAL LOW (ref 135–145)

## 2024-10-07 LAB — COOXEMETRY PANEL
Carboxyhemoglobin: 2.9 % — ABNORMAL HIGH (ref 0.5–1.5)
Methemoglobin: 0.8 % (ref 0.0–1.5)
O2 Saturation: 66.3 %
Total hemoglobin: 15.4 g/dL (ref 12.0–16.0)

## 2024-10-07 LAB — CG4 I-STAT (LACTIC ACID): Lactic Acid, Venous: 1.3 mmol/L (ref 0.5–1.9)

## 2024-10-07 LAB — CBC
HCT: 42.9 % (ref 39.0–52.0)
Hemoglobin: 15.4 g/dL (ref 13.0–17.0)
MCH: 31.6 pg (ref 26.0–34.0)
MCHC: 35.9 g/dL (ref 30.0–36.0)
MCV: 87.9 fL (ref 80.0–100.0)
Platelets: 96 K/uL — ABNORMAL LOW (ref 150–400)
RBC: 4.88 MIL/uL (ref 4.22–5.81)
RDW: 15.7 % — ABNORMAL HIGH (ref 11.5–15.5)
WBC: 10.7 K/uL — ABNORMAL HIGH (ref 4.0–10.5)
nRBC: 0 % (ref 0.0–0.2)

## 2024-10-07 LAB — GLUCOSE, CAPILLARY
Glucose-Capillary: 165 mg/dL — ABNORMAL HIGH (ref 70–99)
Glucose-Capillary: 160 mg/dL — ABNORMAL HIGH (ref 70–99)
Glucose-Capillary: 254 mg/dL — ABNORMAL HIGH (ref 70–99)
Glucose-Capillary: 117 mg/dL — ABNORMAL HIGH (ref 70–99)

## 2024-10-07 LAB — LACTATE DEHYDROGENASE: LDH: 1036 U/L — ABNORMAL HIGH (ref 105–235)

## 2024-10-07 LAB — HEPARIN LEVEL (UNFRACTIONATED)
Heparin Unfractionated: 0.21 [IU]/mL — ABNORMAL LOW (ref 0.30–0.70)
Heparin Unfractionated: 0.22 [IU]/mL — ABNORMAL LOW (ref 0.30–0.70)

## 2024-10-07 LAB — MAGNESIUM: Magnesium: 1.8 mg/dL (ref 1.7–2.4)

## 2024-10-07 LAB — APTT: aPTT: 40 s — ABNORMAL HIGH (ref 24–36)

## 2024-10-07 MED ORDER — HEPARIN (PORCINE) 25000 UT/250ML-% IV SOLN
2300.0000 [IU]/h | INTRAVENOUS | Status: DC
  Administered 2024-10-07: 1200 [IU]/h via INTRAVENOUS
  Administered 2024-10-08: 1500 [IU]/h via INTRAVENOUS
  Administered 2024-10-09: 1800 [IU]/h via INTRAVENOUS
  Administered 2024-10-09: 1900 [IU]/h via INTRAVENOUS
  Administered 2024-10-10: 2400 [IU]/h via INTRAVENOUS
  Administered 2024-10-10 – 2024-10-11 (×2): 2300 [IU]/h via INTRAVENOUS
  Filled 2024-10-07 (×7): qty 250

## 2024-10-07 MED ORDER — SODIUM CHLORIDE 0.9 % IV SOLN: 250.0000 mL | INTRAVENOUS | Status: DC | PRN

## 2024-10-07 MED ORDER — MAGNESIUM SULFATE 2 GM/50ML IV SOLN
2.0000 g | Freq: Once | INTRAVENOUS | Status: AC
  Administered 2024-10-07: 2 g via INTRAVENOUS
  Filled 2024-10-07: qty 50

## 2024-10-07 MED ORDER — FENTANYL CITRATE (PF) 100 MCG/2ML IJ SOLN
INTRAMUSCULAR | Status: AC
  Filled 2024-10-07: qty 2

## 2024-10-07 MED ORDER — FENTANYL CITRATE (PF) 100 MCG/2ML IJ SOLN
INTRAMUSCULAR | Status: DC | PRN
  Administered 2024-10-07: 25 ug via INTRAVENOUS

## 2024-10-07 MED ORDER — HEPARIN (PORCINE) IN NACL 1000-0.9 UT/500ML-% IV SOLN
INTRAVENOUS | Status: DC | PRN
  Administered 2024-10-07: 500 mL via SURGICAL_CAVITY

## 2024-10-07 MED ORDER — POTASSIUM CHLORIDE CRYS ER 20 MEQ PO TBCR
40.0000 meq | EXTENDED_RELEASE_TABLET | Freq: Once | ORAL | Status: DC
  Filled 2024-10-07: qty 2

## 2024-10-07 MED ORDER — MIDAZOLAM HCL (PF) 2 MG/2ML IJ SOLN
INTRAMUSCULAR | Status: DC | PRN
  Administered 2024-10-07: 1 mg via INTRAVENOUS

## 2024-10-07 MED ORDER — LIDOCAINE HCL (PF) 1 % IJ SOLN
INTRAMUSCULAR | Status: AC
  Filled 2024-10-07: qty 30

## 2024-10-07 MED ORDER — LIDOCAINE HCL (PF) 1 % IJ SOLN
INTRAMUSCULAR | Status: DC | PRN
  Administered 2024-10-07: 10 mL

## 2024-10-07 MED ORDER — SODIUM CHLORIDE 0.9% FLUSH
3.0000 mL | Freq: Two times a day (BID) | INTRAVENOUS | Status: DC
  Administered 2024-10-07: 3 mL via INTRAVENOUS

## 2024-10-07 MED ORDER — POTASSIUM CHLORIDE 20 MEQ PO PACK
40.0000 meq | PACK | Freq: Once | ORAL | Status: AC
  Administered 2024-10-07: 40 meq via ORAL
  Filled 2024-10-07: qty 2

## 2024-10-07 MED ORDER — SODIUM CHLORIDE 0.9% FLUSH: 3.0000 mL | INTRAVENOUS | Status: DC | PRN

## 2024-10-07 MED ORDER — MIDAZOLAM HCL 2 MG/2ML IJ SOLN
INTRAMUSCULAR | Status: AC
  Filled 2024-10-07: qty 2

## 2024-10-07 NOTE — Interval H&P Note (Signed)
 History and Physical Interval Note:  10/07/2024 9:36 AM  James [name]  has presented today for surgery, with the diagnosis of heart failure.  The various methods of treatment have been discussed with the patient and family. After consideration of risks, benefits and other options for treatment, the patient has consented to  Procedures: RIGHT HEART CATH (N/A) as a surgical intervention.  The patient's history has been reviewed, patient examined, no change in status, stable for surgery.  I have reviewed the patient's chart and labs.  Questions were answered to the patient's satisfaction.     Macio Kissoon

## 2024-10-07 NOTE — Plan of Care (Signed)
" °  Problem: Education: Goal: Understanding of CV disease, CV risk reduction, and recovery process will improve Outcome: Progressing   Problem: Activity: Goal: Ability to return to baseline activity level will improve Outcome: Progressing   Problem: Cardiovascular: Goal: Ability to achieve and maintain adequate cardiovascular perfusion will improve Outcome: Progressing Goal: Vascular access site(s) Level 0-1 will be maintained Outcome: Progressing   Problem: Health Behavior/Discharge Planning: Goal: Ability to safely manage health-related needs after discharge will improve Outcome: Progressing   Problem: Education: Goal: Knowledge of General Education information will improve Description: Including pain rating scale, medication(s)/side effects and non-pharmacologic comfort measures Outcome: Progressing   Problem: Health Behavior/Discharge Planning: Goal: Ability to manage health-related needs will improve Outcome: Progressing   Problem: Clinical Measurements: Goal: Ability to maintain clinical measurements within normal limits will improve Outcome: Progressing Goal: Will remain free from infection Outcome: Progressing Goal: Diagnostic test results will improve Outcome: Progressing Goal: Respiratory complications will improve Outcome: Progressing Goal: Cardiovascular complication will be avoided Outcome: Progressing   Problem: Activity: Goal: Risk for activity intolerance will decrease Outcome: Progressing   Problem: Nutrition: Goal: Adequate nutrition will be maintained Outcome: Progressing   Problem: Coping: Goal: Level of anxiety will decrease Outcome: Progressing   Problem: Elimination: Goal: Will not experience complications related to bowel motility Outcome: Progressing Goal: Will not experience complications related to urinary retention Outcome: Progressing   Problem: Pain Managment: Goal: General experience of comfort will improve and/or be  controlled Outcome: Progressing   Problem: Safety: Goal: Ability to remain free from injury will improve Outcome: Progressing   Problem: Skin Integrity: Goal: Risk for impaired skin integrity will decrease Outcome: Progressing   Problem: Education: Goal: Ability to describe self-care measures that may prevent or decrease complications (Diabetes Survival Skills Education) will improve Outcome: Progressing   Problem: Coping: Goal: Ability to adjust to condition or change in health will improve Outcome: Progressing   Problem: Fluid Volume: Goal: Ability to maintain a balanced intake and output will improve Outcome: Progressing   Problem: Health Behavior/Discharge Planning: Goal: Ability to identify and utilize available resources and services will improve Outcome: Progressing Goal: Ability to manage health-related needs will improve Outcome: Progressing   Problem: Metabolic: Goal: Ability to maintain appropriate glucose levels will improve Outcome: Progressing   Problem: Nutritional: Goal: Maintenance of adequate nutrition will improve Outcome: Progressing Goal: Progress toward achieving an optimal weight will improve Outcome: Progressing   Problem: Skin Integrity: Goal: Risk for impaired skin integrity will decrease Outcome: Progressing   Problem: Tissue Perfusion: Goal: Adequacy of tissue perfusion will improve Outcome: Progressing   Problem: Cardiac: Goal: Ability to achieve and maintain adequate cardiopulmonary perfusion will improve Outcome: Progressing Goal: Vascular access site(s) Level 0-1 will be maintained Outcome: Progressing   Problem: Fluid Volume: Goal: Ability to achieve a balanced intake and output will improve Outcome: Progressing   Problem: Physical Regulation: Goal: Complications related to the disease process, condition or treatment will be avoided or minimized Outcome: Progressing   Problem: Respiratory: Goal: Will regain and/or maintain  adequate ventilation Outcome: Progressing   "

## 2024-10-07 NOTE — Progress Notes (Signed)
 ANTICOAGULATION CONSULT NOTE  Pharmacy Consult for heparin   Indication: atrial fibrillation (Eliquis  PTA)  Allergies[1]  Patient Measurements: Height: 6' 6 (198.1 cm) Weight: 97.6 kg (215 lb 2.7 oz) IBW/kg (Calculated) : 91.4 Heparin  Dosing Weight: 104 kg   Vital Signs: Temp: 97.5 F (36.4 C) (12/24 1215) Temp Source: Core (12/24 1200) Pulse Rate: 74 (12/24 1215)  Labs: Recent Labs    10/05/24 0517 10/05/24 1445 10/05/24 1747 10/06/24 0450 10/07/24 0452  HGB 14.4  --   --  15.3 15.4  HCT 41.4  --   --  44.1 42.9  PLT 117*  --   --  101* 96*  APTT 62* 66*  --  70*  --   HEPARINUNFRC 0.55  --   --  0.56 0.21*  CREATININE 1.23  --  1.41* 1.34* 2.04*    Estimated Creatinine Clearance: 47.9 mL/min (A) (by C-G formula based on SCr of 2.04 mg/dL (H)).  Medical History: Past Medical History:  Diagnosis Date   CKD (chronic kidney disease) stage 2, GFR 60-89 ml/min    COPD (chronic obstructive pulmonary disease) (HCC)    Diabetes mellitus type II, non insulin  dependent (HCC)    Dilated aortic root    Elevated PSA    Hypercholesteremia    Hypertension    NICM (nonischemic cardiomyopathy) (HCC)    NSVT (nonsustained ventricular tachycardia) (HCC)    Obstructive sleep apnea    Paroxysmal atrial fibrillation (HCC)    Pulmonary hypertension (HCC)    PVC's (premature ventricular contractions)    Tobacco abuse      Assessment: 55 yoM presents for RHC before planned LVAD implant 12/23.  Pharmacy consulted for heparin  dosing while apixaban  on hold\ (last dose given 12/18 in the morning)  She is s/p impella 5.5 on 12/23. Per Dr. Daniel, IV heparin  can be restarted -heparin  was previously infusing at 1550 units/hr -CBC stable   Goal of Therapy:  Heparin  level= 0.3-0.5 aPTT= 66-85 Monitor platelets by anticoagulation protocol: Yes   Plan:  -restart heparin  at 1200 units/hr and will gradually titrate up -Heparin  level and aPTT in 6 hrs  Prentice Poisson, PharmD Clinical  Pharmacist **Pharmacist phone directory can now be found on [email or web address] ([email or web address]).  Listed under [facility] Pharmacy.                [1] No Known Allergies

## 2024-10-07 NOTE — Plan of Care (Signed)
" °  Problem: Activity: Goal: Ability to return to baseline activity level will improve Outcome: Progressing   Problem: Cardiovascular: Goal: Ability to achieve and maintain adequate cardiovascular perfusion will improve Outcome: Progressing Goal: Vascular access site(s) Level 0-1 will be maintained Outcome: Progressing   Problem: Education: Goal: Knowledge of General Education information will improve Description: Including pain rating scale, medication(s)/side effects and non-pharmacologic comfort measures Outcome: Progressing   Problem: Clinical Measurements: Goal: Will remain free from infection Outcome: Progressing   Problem: Nutritional: Goal: Maintenance of adequate nutrition will improve Outcome: Progressing   Problem: Skin Integrity: Goal: Risk for impaired skin integrity will decrease Outcome: Progressing   "

## 2024-10-07 NOTE — Progress Notes (Signed)
 1 Day Post-Op Procedures (LRB): INSERTION, CARDIAC ASSIST DEVICE, IMPELLA 5.5 (N/A) ECHOCARDIOGRAM, TRANSESOPHAGEAL, INTRAOPERATIVE (N/A) Subjective:  No complaints this morning. He feels well and just walked around the ICU.  Milrinone  0.5, Impella P7 with flow 4.4. Co-ox 66% off introducer. Swan removed last night since it was coiled up.  -3429 cc yesterday after lasix  drip resumed last night at 15. Creat bumped to 2.0 from 1.34 yesterday am. Wt up 1 lb.  Objective: Vital signs in last 24 hours: Temp:  [97 F (36.1 C)-99.9 F (37.7 C)] 98.4 F (36.9 C) (12/24 0815) Pulse Rate:  [50-180] 62 (12/24 0815) Cardiac Rhythm: Normal sinus rhythm;Heart block (12/23 2000) Resp:  [9-23] 15 (12/24 0815) BP: (109-122)/(83-103) 122/99 (12/23 1400) SpO2:  [79 %-100 %] 92 % (12/24 0815) Arterial Line BP: (101-141)/(63-100) 115/74 (12/24 0815) Weight:  [97.6 kg] 97.6 kg (12/24 0600)  Hemodynamic parameters for last 24 hours: PAP: (40-77)/(-1-32) 70/1 CVP:  [0 mmHg-43 mmHg] 7 mmHg CO:  [4 L/min-5.1 L/min] 4.1 L/min CI:  [1.7 L/min/m2-2.2 L/min/m2] 1.7 L/min/m2  Intake/Output from previous day: 12/23 0701 - 12/24 0700 In: 1905.7 [I.V.:1683.8] Out: 5335 [Urine:5335] Intake/Output this shift: Total I/O In: 39.8 [I.V.:39.8] Out: 310 [Urine:310]  General appearance: alert and cooperative Neurologic: intact Heart: regular rate and rhythm Lungs: clear to auscultation bilaterally Extremities: no edema Wound: Impella site looks good.  Lab Results: Recent Labs    10/06/24 0450 10/07/24 0452  WBC 7.4 10.7*  HGB 15.3 15.4  HCT 44.1 42.9  PLT 101* 96*   BMET:  Recent Labs    10/06/24 0450 10/07/24 0452  NA 127* 123*  K 3.3* 3.6  CL 83* 81*  CO2 32 30  GLUCOSE 129* 183*  BUN 18 28*  CREATININE 1.34* 2.04*  CALCIUM  9.6 9.6    PT/INR: No results for input(s): LABPROT, INR in the last 72 hours. ABG    Component Value Date/Time   PHART 7.377 07/19/2021 1305   HCO3 28.7  (H) 10/01/2024 0831   TCO2 30 10/01/2024 0831   ACIDBASEDEF 1.0 12/21/2022 1037   ACIDBASEDEF 1.0 12/21/2022 1037   O2SAT 66.3 10/07/2024 0508   CBG (last 3)  Recent Labs    10/06/24 1726 10/06/24 2120 10/07/24 0737  GLUCAP 205* 163* 165*    Assessment/Plan: S/P Procedures (LRB): INSERTION, CARDIAC ASSIST DEVICE, IMPELLA 5.5 (N/A) ECHOCARDIOGRAM, TRANSESOPHAGEAL, INTRAOPERATIVE (N/A)  He looks good clinically on milrinone  0.5 and Impella P7. Co-ox 66. [name] is out so we don't know what his PA pressure is doing. He may benefit from RHC and insertion of new swan to help with decision about LVAD surgery on Friday vs next week. He needs continued diuresis but will have to watch renal function closely since creat bumped to 2.0 this am. Will need to decide by tomorrow morning about proceeding with LVAD Friday vs next week sometime. He is on the schedule for Friday am tentatively pending review of hemodynamics and renal function tomorrow am. I discussed the operative procedure with the patient including alternatives, benefits and risks; including but not limited to bleeding, blood transfusion, infection, stroke, right heart failure requiring mechanical support, LVAD pump failure requiring replacement, heart block requiring a permanent pacemaker, organ dysfunction, and death.  Ozell Jama Daring understands and agrees to proceed.      LOS: 6 days    Dorise MARLA Fellers 10/07/2024

## 2024-10-07 NOTE — Progress Notes (Signed)
 Brief Progress Note  [name] out Urine output improved overnight Cre bump to 2 this morning LDH 1,000 but hematuria resolved  Vitals:   10/07/24 0730 10/07/24 0745  BP:    Pulse: 69 73  Resp: 16 17  Temp: 98.2 F (36.8 C) 98.2 F (36.8 C)  SpO2: 100% 93%    Exam: Axillary site soft, no evidence of hematoma  Plan: - OK to start heparin  from surgical standpoint and advance to goal  Con Clunes, MD Cardiothoracic Surgery Pager: [phone]

## 2024-10-07 NOTE — Progress Notes (Signed)
 Mr Houdeshell has been discussed with the VAD Medical Review board on 09/23/24. The team feels as if the patient is a good candidate for Destination LVAD therapy. The patient meets criteria for a LVAD implant as listed below:  1) NYHA Class: __IV____ documented on ___12/24/25_______(date)  2) Has a left ventricular ejection fraction (LVEF) < 25%   *EF_<20%_____ by echo (date) _12/23/25____  3) Must meet one of the following:   Is inotrope dependent   *On inotropes_Milrinone 0.5 mcg/kg/min____started_12/18/25_______  OR  Has a Cardiac Index (CI) < 2.2  L/min/m2 while not on inotropes:   *CI:___1.79___   4) Must meet one of the following:   __x____ Is on optimal medical management (OMM), based on current heart failure practice guidelines for at least 45 of the last 60 days and are failing to respond   __X____ Has advanced heart failure for at least 14 days and are dependent on an intra-aortic balloon pump (IABP) or similar temporary mechanical circulatory support for at least 7 days      ____ IABP inserted (date) ____      _X___ Impella inserted (date) __12/23/25__  5)  Social work and palliative care evaluations demonstrate appropriate support system in place for discharge to home with a VAD and that end of life discussions have taken place. Both services have expressed no concern regarding patient's candidacy.         *Social work consult (date): 07/15/24 Andriette Leech        *Palliative Care Consult (date): 07/15/24 Bernarda Kitty  6)  Primary caretaker identified that can be taught along with the patient how to manage        the VAD equipment.        *Name: [name]-pts spouse  24)  Deemed appropriate by our financial coordinator: 10/01/24        Prior approval: Authorization # [id number]   8)  VAD Coordinator, Lauraine Ip has met with patient and caregiver, shown them the VAD equipment and discussed with the patient and caregiver about lifestyle changes necessary for success on  mechanical circulatory device.        *Met with Mr Bruni and Gerlene on 07/14/24.       *Consent for VAD Evaluation/Caregiver Agreement/HIPPA Release/Photo Release signed on 07/14/24   9)  Six Minute Walk:  too sick   10) KCCQ Pre VAD:  [place] Cardiomyopathy Questionnaire  KCCQ-12 10/05/24   1 a. Ability to shower/bathe Not at all    1 b. Ability to walk 1 block Extremely limited   1 c. Ability to hurry/jog Limited for other reasons or did not do the activity   2. Edema feet/ankles/legs Never over the past 2 weeks   3. Limited by fatigue All of the time   4. Limited by dyspnea Never over the past 2 weeks   5. Sitting up / on 3+ pillows Never over the past 2 weeks   6. Limited enjoyment of life It has extremely limited my enjoyment of life   7. Rest of life w/ symptoms No at all satisfied   8 a. Participation in hobbies Moderately limited   8 b. Participation in chores Severely limited   8 c. Visiting family/friends Severely limited         11)  Intermacs profile: 1  INTERMACS 1: Critical cardiogenic shock describes a patient who is crashing and burning, in which a patient has life-threatening hypotension and rapidly escalating inotropic pressor support, with critical organ hypoperfusion often confirmed  by worsening acidosis and lactate levels.  INTERMACS 2: Progressive decline describes a patient who has been demonstrated dependent on inotropic support but nonetheless shows signs of continuing deterioration in nutrition, renal function, fluid retention, or other major status indicator. Patient profile 2 can also describe a patient with refractory volume overload, perhaps with evidence of impaired perfusion, in whom inotropic infusions cannot be maintained due to tachyarrhythmias, clinical ischemia, or other intolerance.  INTERMACS 3: Stable but inotrope dependent describes a patient who is clinically stable on mild-moderate doses of intravenous inotropes (or has a  temporary circulatory support device) after repeated documentation of failure to wean without symptomatic hypotension, worsening symptoms, or progressive organ dysfunction (usually renal). It is critical to monitor nutrition, renal function, fluid balance, and overall status carefully in order to distinguish between a   patient who is truly stable at Patient Profile 3 and a patient who has unappreciated decline rendering them Patient Profile 2. This patient may be either at home or in the hospital.      INTERMACS 4: Resting symptoms describes a patient who is at home on oral therapy but frequently has symptoms of congestion at rest or with activities of daily living (ADL). He or she may have orthopnea, shortness of breath during ADL such as dressing or bathing, gastrointestinal symptoms (abdominal discomfort, nausea, poor appetite), disabling ascites or severe lower extremity edema. This patient should be carefully considered for more intensive management and surveillance programs, which may in some cases, reveal poor compliance that would compromise outcomes with any therapy.   .   INTERMACS 5: Exertion Intolerant describes a patient who is comfortable at rest but unable to engage in any activity, living predominantly within the house or housebound. This patient has no congestive symptoms, but may have chronically elevated volume status, frequently with renal dysfunction, and may be characterized as exercise intolerant.      INTERMACS 6: Exertion Limited also describes a patient who is comfortable at rest without evidence of fluid overload, but who is able to do some mild activity. Activities of daily living are comfortable and minor activities outside the home such as visiting friends or going to a restaurant can be performed, but fatigue results within a few minutes of any meaningful physical exertion. This patient has occasional episodes of worsening symptoms and is likely to have had a hospitalization  for heart failure within the past year.   INTERMACS 7: Advanced NYHA Class 3 describes a patient who is clinically stable with a reasonable level of comfortable activity, despite history of previous decompensation that is not recent. This patient is usually able to walk more than a block. Any decompensation requiring intravenous diuretics or hospitalization within the previous month should make this person a Patient Profile 6 or lower.     Lauraine Ip RN, BSN VAD Coordinator 24/7 Pager [phone]

## 2024-10-07 NOTE — Progress Notes (Addendum)
 "    Advanced Heart Failure Rounding Note  Cardiologist: Maude Emmer, MD  [facility] Cardiologist: Dr. Zenaida  Chief Complaint: Chronic systolic heart failure  Patient Profile   James [name] is a 63 y.o. male with history of stage D cardiomyopathy/chronic systolic heart failure now end-stage w/ inotrope dependence, CKD IIIa, hx VT, PAF, prostate cancer, meningioma.    Admitted for LVAD implant/ pre-VAD HF optimization.   Significant events:   12/18: Milrinone  titrated from 0.25 to 0.5 mcg/kg/min post RHC d/t low-output and pulmonary hypertension 12/19: NE added 12/22: Markedly elevated PA pressures, elevated PCWP and elevated SVR. Titrated off NE. Added DBA.  12/23: Impella 5.5 Implanted. DBA stopped    Subjective:    Impella 5.5 @ P-7, Flow 4.2L. No alarms. LDH 356>>920>>1,036. Hematuria improving    Remains on 0.5 Milrinone    Swan came out overnight.   Co-ox 66 % off introducer. LA 1.3   Brisk diuresis yesterday on lasix  gtt at 15/hr, 5.3L in UOP. Unable to measure CVP d/t access.   SCr 1.34>>2.04  K 3.6 Mg 1.8  Corrected Na 125   WBC 7.4>>10.7. Afebrile.  Runs of NSVT this morning. Feels well. Just ambulated the unit w/o significant dyspnea.    Objective:   Weight Range: 97.6 kg Body mass index is 24.87 kg/m.   Vital Signs:   Temp:  [97 F (36.1 C)-99.9 F (37.7 C)] 98.4 F (36.9 C) (12/24 0815) Pulse Rate:  [50-180] 62 (12/24 0815) Resp:  [9-23] 15 (12/24 0815) BP: (109-122)/(83-103) 122/99 (12/23 1400) SpO2:  [79 %-100 %] 92 % (12/24 0815) Arterial Line BP: (101-141)/(63-100) 115/74 (12/24 0815) Weight:  [97.6 kg] 97.6 kg (12/24 0600) Last BM Date : 10/03/24  Weight change: Filed Weights   10/05/24 0500 10/06/24 0500 10/07/24 0600  Weight: 102.3 kg 97.2 kg 97.6 kg    Intake/Output:   Intake/Output Summary (Last 24 hours) at 10/07/2024 0842 Last data filed at 10/07/2024 0800 Gross per 24 hour  Intake 1928.79 ml  Output 5085 ml  Net  -3156.21 ml     Physical Exam   General:  well appearing, sitting up in bed. NAD  Neck: JVD moderately elevated Cor: RRR. No MRG. Rt axillary Impella site stable, no drainage  Lungs: clear  Abdomen: soft, NT, ND  Extremities:  warm and dry. No LEE  Neuro: A&Ox3. Affect pleasant  GU: + foley    Telemetry   NSR 60s, runs of NSVT today, personally reviewed   Labs   CBC Recent Labs    10/06/24 0450 10/07/24 0452  WBC 7.4 10.7*  HGB 15.3 15.4  HCT 44.1 42.9  MCV 90.7 87.9  PLT 101* 96*   Basic Metabolic Panel Recent Labs    87/76/74 0450 10/07/24 0452  NA 127* 123*  K 3.3* 3.6  CL 83* 81*  CO2 32 30  GLUCOSE 129* 183*  BUN 18 28*  CREATININE 1.34* 2.04*  CALCIUM  9.6 9.6  MG 2.1 1.8   Liver Function Tests No results for input(s): AST, ALT, ALKPHOS, BILITOT, PROT, ALBUMIN  in the last 72 hours.  No results for input(s): LIPASE, AMYLASE in the last 72 hours. Cardiac Enzymes No results for input(s): CKTOTAL, CKMB, CKMBINDEX, TROPONINI in the last 72 hours.  BNP: BNP (last 3 results) Recent Labs    06/10/24 1213 07/09/24 1649 07/23/24 0959  BNP 1,071.5* 3,316.0* 595.8*    ProBNP (last 3 results) Recent Labs    10/01/24 1030  PROBNP 8,374.0*     D-Dimer  No results for input(s): DDIMER in the last 72 hours. Hemoglobin A1C No results for input(s): HGBA1C in the last 72 hours. Fasting Lipid Panel No results for input(s): CHOL, HDL, LDLCALC, TRIG, CHOLHDL, LDLDIRECT in the last 72 hours. Medications:   Scheduled Medications:  atorvastatin   40 mg Oral Daily   calcium  carbonate  1 tablet Oral BID WC   Chlorhexidine  Gluconate Cloth  6 each Topical Daily   digoxin   0.125 mg Oral QODAY   insulin  aspart  0-15 Units Subcutaneous TID WC   mexiletine  200 mg Oral Q8H   polyethylene glycol  17 g Oral Daily   sildenafil   20 mg Oral TID   sodium chloride  flush  10-40 mL Intracatheter Q12H   sodium chloride  flush  3 mL  Intravenous Q12H   sodium chloride  flush  3 mL Intravenous Q12H   spironolactone   25 mg Oral QHS    Infusions:  sodium chloride      amiodarone  30 mg/hr (10/07/24 0800)   furosemide  (LASIX ) 200 mg in dextrose  5 % 100 mL (2 mg/mL) infusion 15 mg/hr (10/07/24 0800)   milrinone  0.5 mcg/kg/min (10/07/24 0800)   norepinephrine  (LEVOPHED ) Adult infusion Stopped (10/05/24 1305)   sodium bicarbonate  25 mEq (Impella PURGE) in dextrose  5 % 1000 mL bag      PRN Medications: sodium chloride , sodium chloride , acetaminophen , acetaminophen , fentaNYL  (SUBLIMAZE ) injection, ondansetron  (ZOFRAN ) IV, ondansetron , mouth rinse, sodium chloride  flush, sodium chloride  flush, sodium chloride  flush  Assessment/Plan   1. Acute on chronic systolic CHF, NYHA IV: Nonischemic cardiomyopathy.  Last echo in 9/25 with EF 10-15%, severe LV dilation, mild RVE with mild-moderate RV dilation, moderate MR, moderate TR, dilated IVC. H/o low output HF, has been on home milrinone  0.25.  Extensive discussion recently re: transplant vs LVAD.  Finally, it appears that patient has settled on LVAD as his preference.  He was brought in for hemodynamic optimization prior to LVAD.  RHC 12/18 showed elevated filling pressures with mod-severe mixed pulmonary arterial/pulmonary venous hypertension and low CO despite milrinone  0.25, intropes escalated. Impella 5.5 placed 12/23. Impella at P-7, Flow 4.2L  - Currently on milrinone  at 0.5. Lost swan access. Plan to replace in cath lab today  - Co-ox 66 % off introducer - Diuresing w/ Lasix  gtt. Unable to check CVP d/t lost access. Replace swan today  - continue sprio 25 mg. Holding all other GDMT prior to OR  - Dig level 1.4, decreased to 0.125 mg qod - Tentatively scheduled for LVAD on 12/26. Would like to see pulmonary pressures come down pre-op.  2. Pulmonary hypertension: Moderate-severe mixed pulmonary arterial/pulmonary venous hypertension on RHC 12/18.  - Continue milrinone  - Continue to  push diuresis today - Currently on sildenafil  20 TID 3.  AKI on CKD stage 3: Scr 1.7>>1.3>>2.04  - Likely cardiorenal syndrome. - continue Impella + milrinone  support and diuresis - repeat RHC today  4.  PVCs/NSVT: Has Biotron[employer] ICD.  Will need to turn off tachy therapies prior to VAD implant. D/w rep   - Continue IV amio and mexiletine - Exacerbated by dual inotrope support - Supp K and Mag 5.  Atrial fibrillation: Paroxysmal.   - remains in NSR today - Continue amiodarone  as above.  - Eliquis  converted to heparin  gtt in anticipation of surgery - PharmD managing heparin . Per CT surgery, ok to resume today   6. Meningioma: Monitoring.  7. Prostate cancer: Treated with radiation.  8. Hypokalemia/hypomagnesemia - supp K and Mg today   CRITICAL CARE Performed  by: Caffie Shed   Total critical care time: 20 minutes  Critical care time was exclusive of separately billable procedures and treating other patients.  Critical care was necessary to treat or prevent imminent or life-threatening deterioration.  Critical care was time spent personally by me on the following activities: development of treatment plan with patient and/or surrogate as well as nursing, discussions with consultants, evaluation of patient's response to treatment, examination of patient, obtaining history from patient or surrogate, ordering and performing treatments and interventions, ordering and review of laboratory studies, ordering and review of radiographic studies, pulse oximetry and re-evaluation of patient's condition.   Length of Stay: [address], [place]  10/07/2024, 8:42 AM  Advanced Heart Failure Team Pager [phone] (M-F; 7a - 5p)   Please visit [email or web address]: For overnight coverage please call cardiology fellow first. If fellow not available call Shock/ECMO MD on call.  For ECMO / Mechanical Support (Impella, IABP, LVAD) issues call Shock / ECMO MD on call.    Agree with above  Swan  replaced this am. Remains on Impella 5.5 at P-7 (turned down from P-9 due to hemolysis)/ Feels much better. Hemodynamics improved   Scr 1.3 -> 2.0  General:  Sitting up in chair. No resp difficulty HEENT: normal Neck: supple. RIJ swan  Cor: Regular rate & rhythm. + impella  Lungs: clear Abdomen: soft, nontender, nondistended.Good bowel sounds. Extremities: no cyanosis, clubbing, rash, edema Neuro: alert & orientedx3, cranial nerves grossly intact. moves all 4 extremities w/o difficulty. Affect pleasant  Hemodynamics much improved with Impella support. (Impella parameters viewed personally)  Scr up. Will stop lasix . If Scr back down plan VAD Friday otherwise next week.   D/w TCTS .  CRITICAL CARE Performed by: Cherrie Sieving  Total critical care time: 38 minutes  Critical care time was exclusive of separately billable procedures and treating other patients.  Critical care was necessary to treat or prevent imminent or life-threatening deterioration.  Critical care was time spent personally by me (independent of midlevel providers or residents) on the following activities: development of treatment plan with patient and/or surrogate as well as nursing, discussions with consultants, evaluation of patient's response to treatment, examination of patient, obtaining history from patient or surrogate, ordering and performing treatments and interventions, ordering and review of laboratory studies, ordering and review of radiographic studies, pulse oximetry and re-evaluation of patient's condition.  Sieving Cherrie, MD  5:49 PM   "

## 2024-10-07 NOTE — Progress Notes (Signed)
 ANTICOAGULATION CONSULT NOTE  Pharmacy Consult for heparin   Indication: atrial fibrillation (Eliquis  PTA)  Allergies[1]  Patient Measurements: Height: 6' 6 (198.1 cm) Weight: 97.6 kg (215 lb 2.7 oz) IBW/kg (Calculated) : 91.4 Heparin  Dosing Weight: 104 kg   Vital Signs: Temp: 97.5 F (36.4 C) (12/24 2015) Temp Source: Core (12/24 1600) BP: 94/81 (12/24 1934) Pulse Rate: 62 (12/24 2015)  Labs: Recent Labs    10/05/24 0517 10/05/24 1445 10/05/24 1747 10/06/24 0450 10/07/24 0452 10/07/24 1857  HGB 14.4  --   --  15.3 15.4  --   HCT 41.4  --   --  44.1 42.9  --   PLT 117*  --   --  101* 96*  --   APTT 62* 66*  --  70*  --   --   HEPARINUNFRC 0.55  --   --  0.56 0.21* 0.22*  CREATININE 1.23  --  1.41* 1.34* 2.04*  --     Estimated Creatinine Clearance: 47.9 mL/min (A) (by C-G formula based on SCr of 2.04 mg/dL (H)).  Medical History: Past Medical History:  Diagnosis Date   CKD (chronic kidney disease) stage 2, GFR 60-89 ml/min    COPD (chronic obstructive pulmonary disease) (HCC)    Diabetes mellitus type II, non insulin  dependent (HCC)    Dilated aortic root    Elevated PSA    Hypercholesteremia    Hypertension    NICM (nonischemic cardiomyopathy) (HCC)    NSVT (nonsustained ventricular tachycardia) (HCC)    Obstructive sleep apnea    Paroxysmal atrial fibrillation (HCC)    Pulmonary hypertension (HCC)    PVC's (premature ventricular contractions)    Tobacco abuse     Assessment: 74 yoM presents for RHC before planned LVAD implant 12/23.  Pharmacy consulted for heparin  dosing while apixaban  on hold (last dose given 12/18 AM).  Impella 5.5 placed 12/23 - heparin  initially held overnight with blood-tinged urine. This morning (12/24), hematuria improved, LDH 1036 (356 @ BL), OK to restart heparin  per TCTS.  Previously therapeutic on 1550 units/hr pre-Impella (HL 0.56).    Baseline HL 0.21 pre heparin  start, initial level 0.22 below goal on 1200 units/hr.  No  issues per RN, Impella site stable, hematuria resolved.    Goal of Therapy:  Heparin  level= 0.3-0.5 aPTT= 66-85 Monitor platelets by anticoagulation protocol: Yes   Plan:  Increase heparin  IV 1300 units/hr 6h heparin  level Daily heparin  level, CBC, LDH Watch LDH trend, hematuria recurrence   Maurilio Fila, PharmD Clinical Pharmacist 10/07/2024  8:38 PM     [1] No Known Allergies

## 2024-10-08 DIAGNOSIS — R570 Cardiogenic shock: Secondary | ICD-10-CM

## 2024-10-08 LAB — GLUCOSE, CAPILLARY
Glucose-Capillary: 148 mg/dL — ABNORMAL HIGH (ref 70–99)
Glucose-Capillary: 171 mg/dL — ABNORMAL HIGH (ref 70–99)
Glucose-Capillary: 189 mg/dL — ABNORMAL HIGH (ref 70–99)
Glucose-Capillary: 144 mg/dL — ABNORMAL HIGH (ref 70–99)

## 2024-10-08 LAB — BASIC METABOLIC PANEL WITH GFR
Anion gap: 15 (ref 5–15)
BUN: 38 mg/dL — ABNORMAL HIGH (ref 8–23)
CO2: 27 mmol/L (ref 22–32)
Calcium: 9.2 mg/dL (ref 8.9–10.3)
Chloride: 80 mmol/L — ABNORMAL LOW (ref 98–111)
Creatinine, Ser: 2.34 mg/dL — ABNORMAL HIGH (ref 0.61–1.24)
GFR, Estimated: 30 mL/min — ABNORMAL LOW
Glucose, Bld: 129 mg/dL — ABNORMAL HIGH (ref 70–99)
Potassium: 3.1 mmol/L — ABNORMAL LOW (ref 3.5–5.1)
Sodium: 122 mmol/L — ABNORMAL LOW (ref 135–145)

## 2024-10-08 LAB — CBC
HCT: 42.1 % (ref 39.0–52.0)
Hemoglobin: 15.2 g/dL (ref 13.0–17.0)
MCH: 31.3 pg (ref 26.0–34.0)
MCHC: 36.1 g/dL — ABNORMAL HIGH (ref 30.0–36.0)
MCV: 86.8 fL (ref 80.0–100.0)
Platelets: 75 K/uL — ABNORMAL LOW (ref 150–400)
RBC: 4.85 MIL/uL (ref 4.22–5.81)
RDW: 15.5 % (ref 11.5–15.5)
WBC: 9.5 K/uL (ref 4.0–10.5)
nRBC: 0 % (ref 0.0–0.2)

## 2024-10-08 LAB — POCT I-STAT 7, (LYTES, BLD GAS, ICA,H+H)
Acid-Base Excess: 7 mmol/L — ABNORMAL HIGH (ref 0.0–2.0)
Bicarbonate: 31.3 mmol/L — ABNORMAL HIGH (ref 20.0–28.0)
Calcium, Ion: 1.1 mmol/L — ABNORMAL LOW (ref 1.15–1.40)
HCT: 47 % (ref 39.0–52.0)
Hemoglobin: 16 g/dL (ref 13.0–17.0)
O2 Saturation: 99 %
Patient temperature: 36.4
Potassium: 2.8 mmol/L — ABNORMAL LOW (ref 3.5–5.1)
Sodium: 121 mmol/L — ABNORMAL LOW (ref 135–145)
TCO2: 33 mmol/L — ABNORMAL HIGH (ref 22–32)
pCO2 arterial: 42.6 mmHg (ref 32–48)
pH, Arterial: 7.473 — ABNORMAL HIGH (ref 7.35–7.45)
pO2, Arterial: 113 mmHg — ABNORMAL HIGH (ref 83–108)

## 2024-10-08 LAB — COOXEMETRY PANEL
Carboxyhemoglobin: 3.5 % — ABNORMAL HIGH (ref 0.5–1.5)
Methemoglobin: 0.7 % (ref 0.0–1.5)
O2 Saturation: 82.5 %
Total hemoglobin: 15.9 g/dL (ref 12.0–16.0)

## 2024-10-08 LAB — HEPARIN LEVEL (UNFRACTIONATED)
Heparin Unfractionated: 0.22 [IU]/mL — ABNORMAL LOW (ref 0.30–0.70)
Heparin Unfractionated: <0.1 [IU]/mL — ABNORMAL LOW (ref 0.30–0.70)
Heparin Unfractionated: 0.12 [IU]/mL — ABNORMAL LOW (ref 0.30–0.70)
Heparin Unfractionated: 0.12 [IU]/mL — ABNORMAL LOW (ref 0.30–0.70)

## 2024-10-08 LAB — LACTATE DEHYDROGENASE: LDH: 1147 U/L — ABNORMAL HIGH (ref 105–235)

## 2024-10-08 LAB — MAGNESIUM: Magnesium: 2.3 mg/dL (ref 1.7–2.4)

## 2024-10-08 MED ORDER — POLYETHYLENE GLYCOL 3350 17 G PO PACK
17.0000 g | PACK | Freq: Two times a day (BID) | ORAL | Status: DC
  Administered 2024-10-08 – 2024-10-12 (×6): 17 g via ORAL
  Filled 2024-10-08 (×5): qty 1

## 2024-10-08 MED ORDER — SORBITOL 70 % SOLN
30.0000 mL | Freq: Once | Status: AC
  Administered 2024-10-08: 30 mL via ORAL
  Filled 2024-10-08: qty 30

## 2024-10-08 MED ORDER — POTASSIUM CHLORIDE CRYS ER 20 MEQ PO TBCR: 40.0000 meq | EXTENDED_RELEASE_TABLET | Freq: Four times a day (QID) | ORAL | Status: DC

## 2024-10-08 MED ORDER — POTASSIUM CHLORIDE 20 MEQ PO PACK
40.0000 meq | PACK | Freq: Four times a day (QID) | ORAL | Status: AC
  Administered 2024-10-08 (×2): 40 meq via ORAL
  Filled 2024-10-08 (×2): qty 2

## 2024-10-08 MED ORDER — ALBUMIN HUMAN 5 % IV SOLN
25.0000 g | Freq: Once | INTRAVENOUS | Status: AC
  Administered 2024-10-08: 25 g via INTRAVENOUS
  Filled 2024-10-08: qty 500

## 2024-10-08 NOTE — Progress Notes (Signed)
 ANTICOAGULATION CONSULT NOTE  Pharmacy Consult for heparin   Indication: atrial fibrillation (Eliquis  PTA)  Allergies[1]  Patient Measurements: Height: 6' 6 (198.1 cm) Weight: 98.2 kg (216 lb 7.9 oz) IBW/kg (Calculated) : 91.4 Heparin  Dosing Weight: 104 kg   Vital Signs: Temp: 98.2 F (36.8 C) (12/25 2200) BP: 110/84 (12/25 2000) Pulse Rate: 73 (12/25 2200)  Labs: Recent Labs    10/06/24 0450 10/07/24 0452 10/07/24 1857 10/08/24 0312 10/08/24 0440 10/08/24 0515 10/08/24 1203 10/08/24 1342 10/08/24 2106  HGB 15.3 15.4  --   --  15.2 16.0  --   --   --   HCT 44.1 42.9  --   --  42.1 47.0  --   --   --   PLT 101* 96*  --   --  75*  --   --   --   --   APTT 70*  --  40*  --   --   --   --   --   --   HEPARINUNFRC 0.56 0.21* 0.22*   < >  --   --  <0.10* 0.12* 0.12*  CREATININE 1.34* 2.04*  --   --  2.34*  --   --   --   --    < > = values in this interval not displayed.    Estimated Creatinine Clearance: 41.8 mL/min (A) (by C-G formula based on SCr of 2.34 mg/dL (H)).  Medical History: Past Medical History:  Diagnosis Date   CKD (chronic kidney disease) stage 2, GFR 60-89 ml/min    COPD (chronic obstructive pulmonary disease) (HCC)    Diabetes mellitus type II, non insulin  dependent (HCC)    Dilated aortic root    Elevated PSA    Hypercholesteremia    Hypertension    NICM (nonischemic cardiomyopathy) (HCC)    NSVT (nonsustained ventricular tachycardia) (HCC)    Obstructive sleep apnea    Paroxysmal atrial fibrillation (HCC)    Pulmonary hypertension (HCC)    PVC's (premature ventricular contractions)    Tobacco abuse     Assessment: 57 yoM presents for RHC before planned LVAD implant 12/23.  Pharmacy consulted for heparin  dosing while apixaban  on hold (last dose given 12/18 AM).  Heparin  level remained subtherapeutic at 0.12 oh heparin  drip 1700 uts/hr Hgb (16.0) is stable. PLTs (75) are trending down. LDH is up slightly to 1147. Per RN, no report of pauses,  issues with the line, or signs of bleeding. Hematuria has resolved.  Goal of Therapy:  Heparin  level 0.3-0.5 Monitor platelets by anticoagulation protocol: Yes   Plan:  Increase heparin  IV 1800 units/hr Daily heparin  level, CBC, LDH Watch LDH trend, hematuria recurrence     Olam Chalk Pharm.D. CPP, BCPS Clinical Pharmacist [phone] 10/08/2024 10:20 PM    Please check AMION for all [facility] Pharmacy phone numbers After 10:00 PM, call Main Pharmacy [phone] 10/08/2024  10:19 PM     [1] No Known Allergies

## 2024-10-08 NOTE — Progress Notes (Signed)
 ANTICOAGULATION CONSULT NOTE Pharmacy Consult for heparin   Indication: atrial fibrillation ( Brief A/P: . Heparin  level subtherapeutic .Increase Heparin  rate   Allergies[1]  Patient Measurements: Height: 6' 6 (198.1 cm) Weight: 98.2 kg (216 lb 7.9 oz) IBW/kg (Calculated) : 91.4 Heparin  Dosing Weight: 104 kg   Vital Signs: Temp: 97.5 F (36.4 C) (12/25 0400) BP: 94/81 (12/24 1934) Pulse Rate: 66 (12/25 0400)  Labs: Recent Labs    10/05/24 0517 10/05/24 1445 10/05/24 1747 10/06/24 0450 10/07/24 0452 10/07/24 1857 10/08/24 0312  HGB 14.4  --   --  15.3 15.4  --   --   HCT 41.4  --   --  44.1 42.9  --   --   PLT 117*  --   --  101* 96*  --   --   APTT 62* 66*  --  70*  --  40*  --   HEPARINUNFRC 0.55  --   --  0.56 0.21* 0.22* 0.22*  CREATININE 1.23  --  1.41* 1.34* 2.04*  --   --     Estimated Creatinine Clearance: 47.9 mL/min (A) (by C-G formula based on SCr of 2.04 mg/dL (H)).  Assessment: 63 y.o. male s/p Impella 5.5 placement 12/23 with h/o Afib, Eliquis  on hold for heparin    Goal of Therapy:  Heparin  level 0.3-0.5 units/mL Monitor platelets by anticoagulation protocol: Yes   Plan:  Increase heparin  1500 units/hr Check heparin  level in 8 hours.   Cathlyn Arrant, PharmD, BCPS  10/08/2024  4:06 AM        [1] No Known Allergies

## 2024-10-08 NOTE — Progress Notes (Signed)
 "    Advanced Heart Failure Rounding Note  Cardiologist: Maude Emmer, MD  [facility] Cardiologist: Dr. Zenaida  Chief Complaint: Chronic systolic heart failure  Patient Profile   James [name] is a 63 y.o. male with history of stage D cardiomyopathy/chronic systolic heart failure now end-stage w/ inotrope dependence, CKD IIIa, hx VT, PAF, prostate cancer, meningioma.    Admitted for LVAD implant/ pre-VAD HF optimization.   Significant events:   12/18: Milrinone  titrated from 0.25 to 0.5 mcg/kg/min post RHC d/t low-output and pulmonary hypertension 12/19: NE added 12/22: Markedly elevated PA pressures, elevated PCWP and elevated SVR. Titrated off NE. Added DBA.  12/23: Impella 5.5 Implanted. DBA stopped    Subjective:    Remains on Impella 5.5. Feels much better  Hemodynamics improved  [place] numbers done personally  PAP: (23-54)/(15-41) 42/28 CVP:  [1 mmHg-11 mmHg] 3 mmHg PCWP:  [7 mmHg] 7 mmHg CO:  [4.3 L/min-5.9 L/min] 4.9 L/min CI:  [1.84 L/min/m2-2.5 L/min/m2] 2.1 L/min/m2  SCr up to 2.3 LDH 1,147  Pocus echo: Impella angled toward septum. I repositioned   Objective:   Weight Range: 98.2 kg Body mass index is 25.02 kg/m.   Vital Signs:   Temp:  [97.2 F (36.2 C)-98.2 F (36.8 C)] 98.1 F (36.7 C) (12/25 2030) Pulse Rate:  [49-77] 66 (12/25 2030) Resp:  [9-22] 20 (12/25 2030) BP: (88-113)/(75-85) 110/84 (12/25 2000) SpO2:  [86 %-97 %] 96 % (12/25 2030) Arterial Line BP: (85-131)/(61-77) 122/73 (12/25 2030) Weight:  [98.2 kg] 98.2 kg (12/25 0705) Last BM Date : 10/03/24  Weight change: Filed Weights   10/07/24 0600 10/07/24 2100 10/08/24 0705  Weight: 97.6 kg 98.2 kg 98.2 kg    Intake/Output:   Intake/Output Summary (Last 24 hours) at 10/08/2024 2210 Last data filed at 10/08/2024 2100 Gross per 24 hour  Intake 2001.43 ml  Output 2125 ml  Net -123.57 ml     Physical Exam   General:  Sitting up in bed. No resp difficulty HEENT: normal Neck:  supple. RIJ swan  Cor: Regular rate & rhythm.  Impella site ok  Lungs: clear Abdomen: soft, nontender, nondistended.Good bowel sounds. Extremities: no cyanosis, clubbing, rash, edema Neuro: alert & orientedx3, cranial nerves grossly intact. moves all 4 extremities w/o difficulty. Affect pleasant    Telemetry   NSR 60s + PVCs Personally reviewed  Labs   CBC Recent Labs    10/07/24 0452 10/08/24 0440 10/08/24 0515  WBC 10.7* 9.5  --   HGB 15.4 15.2 16.0  HCT 42.9 42.1 47.0  MCV 87.9 86.8  --   PLT 96* 75*  --    Basic Metabolic Panel Recent Labs    87/75/74 0452 10/08/24 0440 10/08/24 0515  NA 123* 122* 121*  K 3.6 3.1* 2.8*  CL 81* 80*  --   CO2 30 27  --   GLUCOSE 183* 129*  --   BUN 28* 38*  --   CREATININE 2.04* 2.34*  --   CALCIUM  9.6 9.2  --   MG 1.8 2.3  --    Liver Function Tests No results for input(s): AST, ALT, ALKPHOS, BILITOT, PROT, ALBUMIN  in the last 72 hours.  No results for input(s): LIPASE, AMYLASE in the last 72 hours. Cardiac Enzymes No results for input(s): CKTOTAL, CKMB, CKMBINDEX, TROPONINI in the last 72 hours.  BNP: BNP (last 3 results) Recent Labs    06/10/24 1213 07/09/24 1649 07/23/24 0959  BNP 1,071.5* 3,316.0* 595.8*    ProBNP (last 3  results) Recent Labs    10/01/24 1030  PROBNP 8,374.0*     D-Dimer No results for input(s): DDIMER in the last 72 hours. Hemoglobin A1C No results for input(s): HGBA1C in the last 72 hours. Fasting Lipid Panel No results for input(s): CHOL, HDL, LDLCALC, TRIG, CHOLHDL, LDLDIRECT in the last 72 hours. Medications:   Scheduled Medications:  atorvastatin   40 mg Oral Daily   calcium  carbonate  1 tablet Oral BID WC   Chlorhexidine  Gluconate Cloth  6 each Topical Daily   digoxin   0.125 mg Oral QODAY   insulin  aspart  0-15 Units Subcutaneous TID WC   mexiletine  200 mg Oral Q8H   polyethylene glycol  17 g Oral BID   sildenafil   20 mg Oral TID    sodium chloride  flush  10-40 mL Intracatheter Q12H   sodium chloride  flush  3 mL Intravenous Q12H   spironolactone   25 mg Oral QHS    Infusions:  amiodarone  30 mg/hr (10/08/24 2154)   heparin  1,700 Units/hr (10/08/24 2000)   milrinone  0.5 mcg/kg/min (10/08/24 2000)   norepinephrine  (LEVOPHED ) Adult infusion Stopped (10/05/24 1305)   sodium bicarbonate  25 mEq (Impella PURGE) in dextrose  5 % 1000 mL bag      PRN Medications: sodium chloride , acetaminophen , acetaminophen , fentaNYL  (SUBLIMAZE ) injection, ondansetron  (ZOFRAN ) IV, ondansetron , mouth rinse, sodium chloride  flush, sodium chloride  flush  Assessment/Plan   1. Acute on chronic systolic CHF, NYHA IV: Nonischemic cardiomyopathy.  Last echo in 9/25 with EF 10-15%, severe LV dilation, mild RVE with mild-moderate RV dilation, moderate MR, moderate TR, dilated IVC. H/o low output HF, has been on home milrinone  0.25.  Extensive discussion recently re: transplant vs LVAD.  Finally, it appears that patient has settled on LVAD as his preference.  He was brought in for hemodynamic optimization prior to LVAD.  RHC 12/18 showed elevated filling pressures with mod-severe mixed pulmonary arterial/pulmonary venous hypertension and low CO despite milrinone  0.25, intropes escalated. Impella 5.5 placed 12/23.  - Impella at P-7, Flow 4.4L waveforms look good. I repositioned under echo - LDH is up. Will trend after repositioning - Currently on milrinone  at 0.5.  [place] [facility] numbers much improved. Filling pressures low. Scr up - Holding diuretics. Will give albumin  - D/w Dr. Lucas. Not ready for LVAD tomorrow will defer to next week - Need to ambulate him  2. Pulmonary hypertension: Moderate-severe mixed pulmonary arterial/pulmonary venous hypertension on RHC 12/18.  - Improved with Impella 5.5 - continue milrinone  and sildeanfil  3.  AKI on CKD stage 3: Scr 1.7>>1.3>>2.04> 2.3 - Likely cardiorenal syndrome. - continue Impella + milrinone  support and  diuresis - Hold diuretics. Give albumin  - Daily BMET 4.  PVCs/NSVT: Has [employer] ICD.  Will need to turn off tachy therapies prior to VAD implant. D/w rep   - Continue IV amio and mexiletine - Exacerbated by dual inotrope support - Supp K and Mag 5.  Atrial fibrillation: Paroxysmal.   - remains in NSR - Continue amiodarone  as above.  - Eliquis  converted to heparin  gtt in anticipation of surgery - PharmD managing heparin . Per CT surgery, ok to resume today   6. Meningioma: Monitoring.  7. Prostate cancer: Treated with radiation.  8. Hypokalemia/hypomagnesemia - supp as needed  CRITICAL CARE Performed by: Brie Eppard  Total critical care time: 51 minutes  Critical care time was exclusive of separately billable procedures and treating other patients.  Critical care was necessary to treat or prevent imminent or life-threatening deterioration.  Critical care was  time spent personally by me (independent of midlevel providers or residents) on the following activities: development of treatment plan with patient and/or surrogate as well as nursing, discussions with consultants, evaluation of patient's response to treatment, examination of patient, obtaining history from patient or surrogate, ordering and performing treatments and interventions, ordering and review of laboratory studies, ordering and review of radiographic studies, pulse oximetry and re-evaluation of patient's condition.     Length of Stay: 7  Toribio Fuel, MD  10/08/2024, 10:10 PM  Advanced Heart Failure Team Pager [phone] (M-F; 7a - 5p)   Please visit [email or web address]: For overnight coverage please call cardiology fellow first. If fellow not available call Shock/ECMO MD on call.  For ECMO / Mechanical Support (Impella, IABP, LVAD) issues call Shock / ECMO MD on call.    "

## 2024-10-08 NOTE — Progress Notes (Signed)
 ANTICOAGULATION CONSULT NOTE  Pharmacy Consult for heparin   Indication: atrial fibrillation (Eliquis  PTA)  Allergies[1]  Patient Measurements: Height: 6' 6 (198.1 cm) Weight: 98.2 kg (216 lb 7.9 oz) IBW/kg (Calculated) : 91.4 Heparin  Dosing Weight: 104 kg   Vital Signs: Temp: 97.3 F (36.3 C) (12/25 1205) BP: 113/81 (12/25 1200) Pulse Rate: 68 (12/25 1205)  Labs: Recent Labs    10/05/24 1445 10/05/24 1747 10/06/24 0450 10/07/24 0452 10/07/24 1857 10/08/24 0312 10/08/24 0440 10/08/24 0515 10/08/24 1203  HGB  --    < > 15.3 15.4  --   --  15.2 16.0  --   HCT  --    < > 44.1 42.9  --   --  42.1 47.0  --   PLT  --   --  101* 96*  --   --  75*  --   --   APTT 66*  --  70*  --  40*  --   --   --   --   HEPARINUNFRC  --    < > 0.56 0.21* 0.22* 0.22*  --   --  <0.10*  CREATININE  --    < > 1.34* 2.04*  --   --  2.34*  --   --    < > = values in this interval not displayed.    Estimated Creatinine Clearance: 41.8 mL/min (A) (by C-G formula based on SCr of 2.34 mg/dL (H)).  Medical History: Past Medical History:  Diagnosis Date   CKD (chronic kidney disease) stage 2, GFR 60-89 ml/min    COPD (chronic obstructive pulmonary disease) (HCC)    Diabetes mellitus type II, non insulin  dependent (HCC)    Dilated aortic root    Elevated PSA    Hypercholesteremia    Hypertension    NICM (nonischemic cardiomyopathy) (HCC)    NSVT (nonsustained ventricular tachycardia) (HCC)    Obstructive sleep apnea    Paroxysmal atrial fibrillation (HCC)    Pulmonary hypertension (HCC)    PVC's (premature ventricular contractions)    Tobacco abuse     Assessment: 45 yoM presents for RHC before planned LVAD implant 12/23.  Pharmacy consulted for heparin  dosing while apixaban  on hold (last dose given 12/18 AM).   Initial heparin  level < 0.1, ordered a re-draw. Repeat level remained subtherapeutic at 0.12 with heparin  running at 1500 units/hr. Hgb (16.0) is stable. PLTs (75) are trending  down. LDH is up slightly to 1147. Per RN, no report of pauses, issues with the line, or signs of bleeding. Hematuria has resolved.  Goal of Therapy:  Heparin  level 0.3-0.5 Monitor platelets by anticoagulation protocol: Yes   Plan:  Increase heparin  IV 1700 units/hr 6h heparin  level Daily heparin  level, CBC, LDH Watch LDH trend, hematuria recurrence   Thank you for allowing pharmacy to be a part of this patients care.   Nidia Schaffer, PharmD PGY2 Cardiology Pharmacy Resident  Please check AMION for all [facility] Pharmacy phone numbers After 10:00 PM, call Main Pharmacy [phone] 10/08/2024  12:35 PM    [1] No Known Allergies

## 2024-10-08 NOTE — Plan of Care (Signed)
" °  Problem: Education: Goal: Knowledge of General Education information will improve Description: Including pain rating scale, medication(s)/side effects and non-pharmacologic comfort measures Outcome: Progressing   Problem: Coping: Goal: Level of anxiety will decrease Outcome: Progressing   Problem: Elimination: Goal: Will not experience complications related to urinary retention Outcome: Progressing   Problem: Pain Managment: Goal: General experience of comfort will improve and/or be controlled Outcome: Progressing   Problem: Safety: Goal: Ability to remain free from injury will improve Outcome: Progressing   Problem: Skin Integrity: Goal: Risk for impaired skin integrity will decrease Outcome: Progressing   Problem: Fluid Volume: Goal: Ability to maintain a balanced intake and output will improve Outcome: Progressing   Problem: Nutritional: Goal: Progress toward achieving an optimal weight will improve Outcome: Progressing   Problem: Activity: Goal: Ability to return to baseline activity level will improve Outcome: Not Progressing   Problem: Activity: Goal: Risk for activity intolerance will decrease Outcome: Not Progressing   Problem: Nutrition: Goal: Adequate nutrition will be maintained Outcome: Not Progressing   Problem: Elimination: Goal: Will not experience complications related to bowel motility Outcome: Not Progressing   "

## 2024-10-08 NOTE — Plan of Care (Signed)
" °  Problem: Skin Integrity: Goal: Risk for impaired skin integrity will decrease Outcome: Progressing   Problem: Tissue Perfusion: Goal: Adequacy of tissue perfusion will improve Outcome: Progressing   Problem: Cardiac: Goal: Ability to achieve and maintain adequate cardiopulmonary perfusion will improve Outcome: Progressing Goal: Vascular access site(s) Level 0-1 will be maintained Outcome: Progressing   Problem: Physical Regulation: Goal: Complications related to the disease process, condition or treatment will be avoided or minimized Outcome: Progressing   Problem: Respiratory: Goal: Will regain and/or maintain adequate ventilation Outcome: Progressing   "

## 2024-10-09 ENCOUNTER — Inpatient Hospital Stay (HOSPITAL_COMMUNITY)
Admission: AD | Disposition: A | Discharge status: Rehabilitation Facility | Payer: Self-pay | Source: Home / Self Care | Attending: Cardiology

## 2024-10-09 ENCOUNTER — Inpatient Hospital Stay (HOSPITAL_COMMUNITY): Admission: RE | Admit: 2024-10-09 | Admitting: Surgery

## 2024-10-09 DIAGNOSIS — I5023 Acute on chronic systolic (congestive) heart failure: Secondary | ICD-10-CM | POA: Diagnosis not present

## 2024-10-09 DIAGNOSIS — R570 Cardiogenic shock: Secondary | ICD-10-CM | POA: Diagnosis not present

## 2024-10-09 LAB — BASIC METABOLIC PANEL WITH GFR
Anion gap: 11 (ref 5–15)
BUN: 36 mg/dL — ABNORMAL HIGH (ref 8–23)
CO2: 25 mmol/L (ref 22–32)
Calcium: 8.6 mg/dL — ABNORMAL LOW (ref 8.9–10.3)
Chloride: 86 mmol/L — ABNORMAL LOW (ref 98–111)
Creatinine, Ser: 2.03 mg/dL — ABNORMAL HIGH (ref 0.61–1.24)
GFR, Estimated: 36 mL/min — ABNORMAL LOW
Glucose, Bld: 129 mg/dL — ABNORMAL HIGH (ref 70–99)
Potassium: 3.2 mmol/L — ABNORMAL LOW (ref 3.5–5.1)
Sodium: 123 mmol/L — ABNORMAL LOW (ref 135–145)

## 2024-10-09 LAB — POCT I-STAT 7, (LYTES, BLD GAS, ICA,H+H)
Acid-Base Excess: 4 mmol/L — ABNORMAL HIGH (ref 0.0–2.0)
Bicarbonate: 27.6 mmol/L (ref 20.0–28.0)
Calcium, Ion: 1.13 mmol/L — ABNORMAL LOW (ref 1.15–1.40)
HCT: 45 % (ref 39.0–52.0)
Hemoglobin: 15.3 g/dL (ref 13.0–17.0)
O2 Saturation: 96 %
Patient temperature: 36.6
Potassium: 3.3 mmol/L — ABNORMAL LOW (ref 3.5–5.1)
Sodium: 124 mmol/L — ABNORMAL LOW (ref 135–145)
TCO2: 29 mmol/L (ref 22–32)
pCO2 arterial: 36.9 mmHg (ref 32–48)
pH, Arterial: 7.48 — ABNORMAL HIGH (ref 7.35–7.45)
pO2, Arterial: 73 mmHg — ABNORMAL LOW (ref 83–108)

## 2024-10-09 LAB — GLUCOSE, CAPILLARY
Glucose-Capillary: 253 mg/dL — ABNORMAL HIGH (ref 70–99)
Glucose-Capillary: 115 mg/dL — ABNORMAL HIGH (ref 70–99)
Glucose-Capillary: 153 mg/dL — ABNORMAL HIGH (ref 70–99)
Glucose-Capillary: 129 mg/dL — ABNORMAL HIGH (ref 70–99)

## 2024-10-09 LAB — LACTATE DEHYDROGENASE: LDH: 984 U/L — ABNORMAL HIGH (ref 105–235)

## 2024-10-09 LAB — POCT I-STAT EG7
Acid-Base Excess: 7 mmol/L — ABNORMAL HIGH (ref 0.0–2.0)
Bicarbonate: 32.2 mmol/L — ABNORMAL HIGH (ref 20.0–28.0)
Calcium, Ion: 1.14 mmol/L — ABNORMAL LOW (ref 1.15–1.40)
HCT: 49 % (ref 39.0–52.0)
Hemoglobin: 16.7 g/dL (ref 13.0–17.0)
O2 Saturation: 66 %
Potassium: 3.2 mmol/L — ABNORMAL LOW (ref 3.5–5.1)
Sodium: 122 mmol/L — ABNORMAL LOW (ref 135–145)
TCO2: 34 mmol/L — ABNORMAL HIGH (ref 22–32)
pCO2, Ven: 46.6 mmHg (ref 44–60)
pH, Ven: 7.448 — ABNORMAL HIGH (ref 7.25–7.43)
pO2, Ven: 33 mmHg (ref 32–45)

## 2024-10-09 LAB — COOXEMETRY PANEL
Carboxyhemoglobin: 2.7 % — ABNORMAL HIGH (ref 0.5–1.5)
Methemoglobin: 0.8 % (ref 0.0–1.5)
O2 Saturation: 72.3 %
Total hemoglobin: 14.4 g/dL (ref 12.0–16.0)

## 2024-10-09 LAB — CBC
HCT: 40.8 % (ref 39.0–52.0)
Hemoglobin: 14.5 g/dL (ref 13.0–17.0)
MCH: 31.3 pg (ref 26.0–34.0)
MCHC: 35.5 g/dL (ref 30.0–36.0)
MCV: 88.1 fL (ref 80.0–100.0)
Platelets: 72 K/uL — ABNORMAL LOW (ref 150–400)
RBC: 4.63 MIL/uL (ref 4.22–5.81)
RDW: 15.6 % — ABNORMAL HIGH (ref 11.5–15.5)
WBC: 7.5 K/uL (ref 4.0–10.5)
nRBC: 0 % (ref 0.0–0.2)

## 2024-10-09 LAB — HEPARIN LEVEL (UNFRACTIONATED)
Heparin Unfractionated: 0.12 [IU]/mL — ABNORMAL LOW (ref 0.30–0.70)
Heparin Unfractionated: 0.1 [IU]/mL — ABNORMAL LOW (ref 0.30–0.70)

## 2024-10-09 LAB — MAGNESIUM: Magnesium: 2.1 mg/dL (ref 1.7–2.4)

## 2024-10-09 SURGERY — INSERTION OF IMPLANTABLE LEFT VENTRICULAR ASSIST DEVICE
Anesthesia: General | Site: Chest

## 2024-10-09 MED ORDER — LACTULOSE 10 GM/15ML PO SOLN
30.0000 g | Freq: Once | ORAL | Status: AC
  Administered 2024-10-09: 30 g via ORAL
  Filled 2024-10-09: qty 45

## 2024-10-09 MED ORDER — CEFAZOLIN SODIUM-DEXTROSE 2-4 GM/100ML-% IV SOLN
INTRAVENOUS | Status: AC
  Filled 2024-10-09: qty 100

## 2024-10-09 MED ORDER — POTASSIUM CHLORIDE 20 MEQ PO PACK
40.0000 meq | PACK | ORAL | Status: AC
  Administered 2024-10-09 (×2): 40 meq via ORAL
  Filled 2024-10-09 (×2): qty 2

## 2024-10-09 MED ORDER — SORBITOL 70 % SOLN
30.0000 mL | Freq: Every day | Status: DC | PRN
  Administered 2024-10-10: 30 mL via ORAL
  Filled 2024-10-09: qty 30

## 2024-10-09 NOTE — Progress Notes (Signed)
 ANTICOAGULATION CONSULT NOTE  Pharmacy Consult for heparin   Indication: atrial fibrillation (Eliquis  PTA)  Allergies[1]  Patient Measurements: Height: 6' 6 (198.1 cm) Weight: 97.3 kg (214 lb 8.1 oz) IBW/kg (Calculated) : 91.4 Heparin  Dosing Weight: 104 kg   Vital Signs: Temp: 98.1 F (36.7 C) (12/26 1700) Temp Source: Core (12/26 1200) BP: 91/75 (12/26 1200) Pulse Rate: 67 (12/26 1700)  Labs: Recent Labs    10/07/24 0452 10/07/24 1047 10/07/24 1857 10/08/24 0312 10/08/24 0440 10/08/24 0515 10/08/24 1203 10/08/24 2106 10/09/24 0523 10/09/24 0542 10/09/24 0557 10/09/24 1700  HGB 15.4   < >  --   --  15.2 16.0  --   --  14.5 15.3  --   --   HCT 42.9   < >  --   --  42.1 47.0  --   --  40.8 45.0  --   --   PLT 96*  --   --   --  75*  --   --   --  72*  --   --   --   APTT  --   --  40*  --   --   --   --   --   --   --   --   --   HEPARINUNFRC 0.21*  --  0.22*   < >  --   --    < > 0.12* 0.12*  --   --  0.10*  CREATININE 2.04*  --   --   --  2.34*  --   --   --   --   --  2.03*  --    < > = values in this interval not displayed.    Estimated Creatinine Clearance: 48.2 mL/min (A) (by C-G formula based on SCr of 2.03 mg/dL (H)).   Assessment: 27 yoM presents for RHC before planned LVAD implant.  Pharmacy consulted for heparin  dosing while apixaban  on hold (last dose given 12/18 AM). Pt s/p Impella 5.5 12/23, heparin  resumed postop.  Heparin  level remains subtherapeutic at 0.1 on infusion at 1900 units/hr. No issues with line or bleeding reported per RN.  Goal of Therapy:  Heparin  level 0.3-0.5 units/ml Monitor platelets by anticoagulation protocol: Yes   Plan:  Increase heparin  IV to 2100 units/hr Repeat heparin  level in 8h  Vito Ralph, PharmD, BCPS Please see amion for complete clinical pharmacist phone list 10/09/2024      [1] No Known Allergies

## 2024-10-09 NOTE — Progress Notes (Addendum)
 "    Advanced Heart Failure Rounding Note  Cardiologist: Maude Emmer, MD  [facility] Cardiologist: Dr. Zenaida  Chief Complaint: Chronic systolic heart failure  Patient Profile   James [name] is a 63 y.o. male with history of stage D cardiomyopathy/chronic systolic heart failure now end-stage w/ inotrope dependence, CKD IIIa, hx VT, PAF, prostate cancer, meningioma.    Admitted for LVAD implant/ pre-VAD HF optimization.   Significant events:   12/18: Milrinone  titrated from 0.25 to 0.5 mcg/kg/min post RHC d/t low-output and pulmonary hypertension 12/19: NE added 12/22: Markedly elevated PA pressures, elevated PCWP and elevated SVR. Titrated off NE. Added DBA.  12/23: Impella 5.5 Implanted. DBA stopped   12/25: Low filling pressures off swan. Diuretics held. Albumin  given    Subjective:    Remains on Milrinone  0.5 + Impella 5.5 at P-7, Flow 4.5 L  LDH 1,147>>984. No hematuria   CVP 4 PAP 40/18 CI 2.08 overnight  Co-ox 72%   Impella LVEDP 16-20   Scr improving, 2.34>>2.03  K 3.2 Mg 2.1   Sitting up in chair. Feels well. No complaints. Denies CP/Dyspnea. Ambulating ok. No BM in 4 days.    Objective:   Weight Range: 97.3 kg Body mass index is 24.79 kg/m.   Vital Signs:   Temp:  [97.2 F (36.2 C)-98.4 F (36.9 C)] 97.9 F (36.6 C) (12/26 0600) Pulse Rate:  [49-80] 80 (12/26 0630) Resp:  [10-23] 16 (12/26 0700) BP: (88-113)/(75-92) 109/92 (12/26 0400) SpO2:  [86 %-97 %] 96 % (12/26 0600) Arterial Line BP: (85-135)/(61-97) 114/70 (12/26 0700) Weight:  [97.3 kg] 97.3 kg (12/26 0500) Last BM Date : 10/03/24  Weight change: Filed Weights   10/07/24 2100 10/08/24 0705 10/09/24 0500  Weight: 98.2 kg 98.2 kg 97.3 kg    Intake/Output:   Intake/Output Summary (Last 24 hours) at 10/09/2024 0731 Last data filed at 10/09/2024 0700 Gross per 24 hour  Intake 2355.08 ml  Output 2915 ml  Net -559.92 ml     Physical Exam   GENERAL: fatigued appearing, sitting up  in chair. NAD Lungs- clear  CARDIAC:  JVP not elevated, + RIJ Swan            Normal rate with regular rhythm. No MRG. No LEE  ABDOMEN: Soft, non-tender, non-distended.  EXTREMITIES: Warm and well perfused.  NEUROLOGIC: No obvious FND GU: + Foley   Telemetry   NSR 70s w/ PVCs Personally reviewed  Labs   CBC Recent Labs    10/08/24 0440 10/08/24 0515 10/09/24 0523 10/09/24 0542  WBC 9.5  --  7.5  --   HGB 15.2   < > 14.5 15.3  HCT 42.1   < > 40.8 45.0  MCV 86.8  --  88.1  --   PLT 75*  --  72*  --    < > = values in this interval not displayed.   Basic Metabolic Panel Recent Labs    87/74/74 0440 10/08/24 0515 10/09/24 0542 10/09/24 0557  NA 122*   < > 124* 123*  K 3.1*   < > 3.3* 3.2*  CL 80*  --   --  86*  CO2 27  --   --  25  GLUCOSE 129*  --   --  129*  BUN 38*  --   --  36*  CREATININE 2.34*  --   --  2.03*  CALCIUM  9.2  --   --  8.6*  MG 2.3  --   --  2.1   < > = values in this interval not displayed.   Liver Function Tests No results for input(s): AST, ALT, ALKPHOS, BILITOT, PROT, ALBUMIN  in the last 72 hours.  No results for input(s): LIPASE, AMYLASE in the last 72 hours. Cardiac Enzymes No results for input(s): CKTOTAL, CKMB, CKMBINDEX, TROPONINI in the last 72 hours.  BNP: BNP (last 3 results) Recent Labs    06/10/24 1213 07/09/24 1649 07/23/24 0959  BNP 1,071.5* 3,316.0* 595.8*    ProBNP (last 3 results) Recent Labs    10/01/24 1030  PROBNP 8,374.0*     D-Dimer No results for input(s): DDIMER in the last 72 hours. Hemoglobin A1C No results for input(s): HGBA1C in the last 72 hours. Fasting Lipid Panel No results for input(s): CHOL, HDL, LDLCALC, TRIG, CHOLHDL, LDLDIRECT in the last 72 hours. Medications:   Scheduled Medications:  atorvastatin   40 mg Oral Daily   calcium  carbonate  1 tablet Oral BID WC   Chlorhexidine  Gluconate Cloth  6 each Topical Daily   digoxin   0.125 mg Oral QODAY    insulin  aspart  0-15 Units Subcutaneous TID WC   mexiletine  200 mg Oral Q8H   polyethylene glycol  17 g Oral BID   sildenafil   20 mg Oral TID   sodium chloride  flush  10-40 mL Intracatheter Q12H   sodium chloride  flush  3 mL Intravenous Q12H   spironolactone   25 mg Oral QHS    Infusions:  amiodarone  30 mg/hr (10/09/24 0700)   heparin  1,800 Units/hr (10/09/24 0700)   milrinone  0.5 mcg/kg/min (10/09/24 0700)   norepinephrine  (LEVOPHED ) Adult infusion Stopped (10/05/24 1305)   sodium bicarbonate  25 mEq (Impella PURGE) in dextrose  5 % 1000 mL bag      PRN Medications: sodium chloride , acetaminophen , acetaminophen , fentaNYL  (SUBLIMAZE ) injection, ondansetron  (ZOFRAN ) IV, ondansetron , mouth rinse, sodium chloride  flush, sodium chloride  flush  Assessment/Plan   1. Acute on chronic systolic CHF, NYHA IV: Nonischemic cardiomyopathy.  Last echo in 9/25 with EF 10-15%, severe LV dilation, mild RVE with mild-moderate RV dilation, moderate MR, moderate TR, dilated IVC. H/o low output HF, has been on home milrinone  0.25.  Extensive discussion recently re: transplant vs LVAD.  Finally, it appears that patient has settled on LVAD as his preference.  He was brought in for hemodynamic optimization prior to LVAD.  RHC 12/18 showed elevated filling pressures with mod-severe mixed pulmonary arterial/pulmonary venous hypertension and low CO despite milrinone  0.25, intropes escalated. Impella 5.5 placed 12/23.  - Impella at P-7, Flow 4.5L waveforms look good.  - LDH trending down after Impella repositioning - Currently on milrinone  at 0.5.  [place] [facility] numbers much improved. Filling pressures low.  - Continue to hold diuretics today  - D/w Dr. Lucas. Not ready for LVAD this wk, will defer to next week - Continue to ambulate   2. Pulmonary hypertension: Moderate-severe mixed pulmonary arterial/pulmonary venous hypertension on RHC 12/18.  - Improved with Impella 5.5 - continue milrinone  and sildeanfil  3.   AKI on CKD stage 3: Scr 1.7>>1.3>>2.04> 2.3>>2.0  - Likely cardiorenal syndrome. - continue Impella + milrinone  support and diuresis - Hold diuretics.  - Daily BMET 4.  PVCs/NSVT: Exacerbated by dual inotrope support. Has [employer]ron[employer] ICD.   - Continue IV amio and mexiletine - Supp K and Mag - Will need to turn off tachy therapies prior to VAD implant. D/w rep   5.  Atrial fibrillation: Paroxysmal.   - remains in NSR - Continue amiodarone  as above.  -  Eliquis  converted to heparin  gtt in anticipation of surgery - PharmD managing heparin .  6. Meningioma: Monitoring.  7. Prostate cancer: Treated with radiation.  8. Hypokalemia/hypomagnesemia - supp as needed 9. Constipation: advance bowel regimen   CRITICAL CARE Performed by: Caffie Shed   Total critical care time: 15 minutes  Critical care time was exclusive of separately billable procedures and treating other patients.  Critical care was necessary to treat or prevent imminent or life-threatening deterioration.  Critical care was time spent personally by me on the following activities: development of treatment plan with patient and/or surrogate as well as nursing, discussions with consultants, evaluation of patient's response to treatment, examination of patient, obtaining history from patient or surrogate, ordering and performing treatments and interventions, ordering and review of laboratory studies, ordering and review of radiographic studies, pulse oximetry and re-evaluation of patient's condition.   Length of Stay: [address] [place]  10/09/2024, 7:31 AM  Advanced Heart Failure Team Pager [phone] (M-F; 7a - 5p)   Please visit [email or web address]: For overnight coverage please call cardiology fellow first. If fellow not available call Shock/ECMO MD on call.  For ECMO / Mechanical Support (Impella, IABP, LVAD) issues call Shock / ECMO MD on call.     Agree with above.  Remains on Impella 5.5 at P-7 flow 4.5 L.  Hemodynamics improved.   Received albumin  yesterday. Diuretics on hold Scr improving  General:  Sitting up in bed. No resp difficulty HEENT: normal Neck: supple. RIJ swan  Cor: Regular rate & rhythm. No rubs, gallops or murmurs. Impella site ok Lungs: clear Abdomen: soft, nontender, nondistended.Good bowel sounds. Extremities: no cyanosis, clubbing, rash, edema Neuro: alert & orientedx3, cranial nerves grossly intact. moves all 4 extremities w/o difficulty. Affect pleasant  Hemodynamics improved on Impella & milrinone . Scr impoving.  Continue current support.   LVAD likely next week.  CRITICAL CARE Performed by: Cherrie Sieving  Total critical care time: 41 minutes  Critical care time was exclusive of separately billable procedures and treating other patients.  Critical care was necessary to treat or prevent imminent or life-threatening deterioration.  Critical care was time spent personally by me (independent of midlevel providers or residents) on the following activities: development of treatment plan with patient and/or surrogate as well as nursing, discussions with consultants, evaluation of patient's response to treatment, examination of patient, obtaining history from patient or surrogate, ordering and performing treatments and interventions, ordering and review of laboratory studies, ordering and review of radiographic studies, pulse oximetry and re-evaluation of patient's condition.  Sieving Cherrie, MD  5:26 PM   "

## 2024-10-09 NOTE — TOC Progression Note (Signed)
 Transition of Care [facility]) - Progression Note    Patient Details  Name: James [name] MRN: [id number] Date of Birth: 1961-08-22  Transition of Care [facility]) CM/SW Contact  [name] Phone Number: [phone] 10/09/2024, 10:20 AM  Clinical Narrative:   Chart reviewed for discharge readiness, patient not medically stable for d/c.   HF CSW/CM will continue to follow and monitor for dc readiness.     Expected Discharge Plan: IP Rehab Facility Barriers to Discharge: Continued Medical Work up               Expected Discharge Plan and Services   Discharge Planning Services: CM Consult Post Acute Care Choice: Home Health Living arrangements for the past 2 months: Single Family Home                                       Social Drivers of Health (SDOH) Interventions SDOH Screenings   Food Insecurity: No Food Insecurity (10/06/2024)  Housing: Low Risk (10/06/2024)  Transportation Needs: No Transportation Needs (10/06/2024)  Utilities: Not At Risk (10/06/2024)  Alcohol Screen: Low Risk (11/18/2023)  Depression (PHQ2-9): Low Risk (08/21/2024)  Tobacco Use: Medium Risk (09/17/2024)    Readmission Risk Interventions    07/10/2024   11:05 AM 10/10/2023   10:37 AM  Readmission Risk Prevention Plan  Transportation Screening Complete Complete  Home Care Screening Complete Complete  Medication Review (RN CM) Complete Complete

## 2024-10-09 NOTE — Plan of Care (Signed)
  Problem: Education: Goal: Understanding of CV disease, CV risk reduction, and recovery process will improve Outcome: Progressing   Problem: Activity: Goal: Ability to return to baseline activity level will improve Outcome: Progressing   Problem: Cardiovascular: Goal: Ability to achieve and maintain adequate cardiovascular perfusion will improve Outcome: Progressing Goal: Vascular access site(s) Level 0-1 will be maintained Outcome: Progressing   

## 2024-10-09 NOTE — Progress Notes (Signed)
 ANTICOAGULATION CONSULT NOTE  Pharmacy Consult for heparin   Indication: atrial fibrillation (Eliquis  PTA)  Allergies[1]  Patient Measurements: Height: 6' 6 (198.1 cm) Weight: 97.3 kg (214 lb 8.1 oz) IBW/kg (Calculated) : 91.4 Heparin  Dosing Weight: 104 kg   Vital Signs: Temp: 97.9 F (36.6 C) (12/26 0600) Temp Source: Core (12/25 2000) BP: 109/92 (12/26 0400) Pulse Rate: 80 (12/26 0630)  Labs: Recent Labs    10/07/24 0452 10/07/24 1857 10/08/24 0312 10/08/24 0440 10/08/24 0515 10/08/24 1203 10/08/24 1342 10/08/24 2106 10/09/24 0523 10/09/24 0542 10/09/24 0557  HGB 15.4  --   --  15.2 16.0  --   --   --  14.5 15.3  --   HCT 42.9  --   --  42.1 47.0  --   --   --  40.8 45.0  --   PLT 96*  --   --  75*  --   --   --   --  72*  --   --   APTT  --  40*  --   --   --   --   --   --   --   --   --   HEPARINUNFRC 0.21* 0.22*   < >  --   --    < > 0.12* 0.12* 0.12*  --   --   CREATININE 2.04*  --   --  2.34*  --   --   --   --   --   --  2.03*   < > = values in this interval not displayed.    Estimated Creatinine Clearance: 48.2 mL/min (A) (by C-G formula based on SCr of 2.03 mg/dL (H)).  Medical History: Past Medical History:  Diagnosis Date   CKD (chronic kidney disease) stage 2, GFR 60-89 ml/min    COPD (chronic obstructive pulmonary disease) (HCC)    Diabetes mellitus type II, non insulin  dependent (HCC)    Dilated aortic root    Elevated PSA    Hypercholesteremia    Hypertension    NICM (nonischemic cardiomyopathy) (HCC)    NSVT (nonsustained ventricular tachycardia) (HCC)    Obstructive sleep apnea    Paroxysmal atrial fibrillation (HCC)    Pulmonary hypertension (HCC)    PVC's (premature ventricular contractions)    Tobacco abuse     Assessment: 73 yoM presents for RHC before planned LVAD implant.  Pharmacy consulted for heparin  dosing while apixaban  on hold (last dose given 12/18 AM). Pt s/p Impella 5.5 12/23, heparin  resumed postop.  Heparin  level  remains subtherapeutic at 0.12, H/H stable, pltc remains low, LDH elevated but improved. No bleeding issues, urine clear.  Goal of Therapy:  Heparin  level 0.3-0.5 units/ml Monitor platelets by anticoagulation protocol: Yes   Plan:  Increase heparin  IV 1900 units/hr Repeat heparin  level in 8h Daily heparin  level, CBC, LDH  Ozell Jamaica, PharmD, [place], [facility] Clinical Pharmacist [phone] Please check AMION for all [facility] Pharmacy numbers 10/09/2024     [1] No Known Allergies

## 2024-10-10 DIAGNOSIS — Z95811 Presence of heart assist device: Secondary | ICD-10-CM | POA: Diagnosis not present

## 2024-10-10 DIAGNOSIS — R570 Cardiogenic shock: Secondary | ICD-10-CM | POA: Diagnosis not present

## 2024-10-10 DIAGNOSIS — N179 Acute kidney failure, unspecified: Secondary | ICD-10-CM | POA: Diagnosis not present

## 2024-10-10 LAB — BASIC METABOLIC PANEL WITH GFR
Anion gap: 9 (ref 5–15)
Anion gap: 12 (ref 5–15)
BUN: 35 mg/dL — ABNORMAL HIGH (ref 8–23)
BUN: 34 mg/dL — ABNORMAL HIGH (ref 8–23)
CO2: 25 mmol/L (ref 22–32)
CO2: 25 mmol/L (ref 22–32)
Calcium: 8.6 mg/dL — ABNORMAL LOW (ref 8.9–10.3)
Calcium: 9.2 mg/dL (ref 8.9–10.3)
Chloride: 87 mmol/L — ABNORMAL LOW (ref 98–111)
Chloride: 85 mmol/L — ABNORMAL LOW (ref 98–111)
Creatinine, Ser: 1.83 mg/dL — ABNORMAL HIGH (ref 0.61–1.24)
Creatinine, Ser: 1.94 mg/dL — ABNORMAL HIGH (ref 0.61–1.24)
GFR, Estimated: 41 mL/min — ABNORMAL LOW
GFR, Estimated: 38 mL/min — ABNORMAL LOW
Glucose, Bld: 119 mg/dL — ABNORMAL HIGH (ref 70–99)
Glucose, Bld: 200 mg/dL — ABNORMAL HIGH (ref 70–99)
Potassium: 3.3 mmol/L — ABNORMAL LOW (ref 3.5–5.1)
Potassium: 3.5 mmol/L (ref 3.5–5.1)
Sodium: 122 mmol/L — ABNORMAL LOW (ref 135–145)
Sodium: 122 mmol/L — ABNORMAL LOW (ref 135–145)

## 2024-10-10 LAB — POCT I-STAT 7, (LYTES, BLD GAS, ICA,H+H)
Acid-Base Excess: 2 mmol/L (ref 0.0–2.0)
Bicarbonate: 25.6 mmol/L (ref 20.0–28.0)
Calcium, Ion: 1.19 mmol/L (ref 1.15–1.40)
HCT: 44 % (ref 39.0–52.0)
Hemoglobin: 15 g/dL (ref 13.0–17.0)
O2 Saturation: 96 %
Patient temperature: 36.5
Potassium: 3.4 mmol/L — ABNORMAL LOW (ref 3.5–5.1)
Sodium: 125 mmol/L — ABNORMAL LOW (ref 135–145)
TCO2: 27 mmol/L (ref 22–32)
pCO2 arterial: 35.5 mmHg (ref 32–48)
pH, Arterial: 7.464 — ABNORMAL HIGH (ref 7.35–7.45)
pO2, Arterial: 76 mmHg — ABNORMAL LOW (ref 83–108)

## 2024-10-10 LAB — COOXEMETRY PANEL
Carboxyhemoglobin: 2.6 % — ABNORMAL HIGH (ref 0.5–1.5)
Methemoglobin: <0.7 % (ref 0.0–1.5)
O2 Saturation: 78.8 %
Total hemoglobin: 11.7 g/dL — ABNORMAL LOW (ref 12.0–16.0)

## 2024-10-10 LAB — CBC
HCT: 38.7 % — ABNORMAL LOW (ref 39.0–52.0)
Hemoglobin: 13.9 g/dL (ref 13.0–17.0)
MCH: 31.2 pg (ref 26.0–34.0)
MCHC: 35.9 g/dL (ref 30.0–36.0)
MCV: 86.8 fL (ref 80.0–100.0)
Platelets: 64 K/uL — ABNORMAL LOW (ref 150–400)
RBC: 4.46 MIL/uL (ref 4.22–5.81)
RDW: 15.7 % — ABNORMAL HIGH (ref 11.5–15.5)
WBC: 6.8 K/uL (ref 4.0–10.5)
nRBC: 0 % (ref 0.0–0.2)

## 2024-10-10 LAB — MAGNESIUM: Magnesium: 2.1 mg/dL (ref 1.7–2.4)

## 2024-10-10 LAB — GLUCOSE, CAPILLARY
Glucose-Capillary: 145 mg/dL — ABNORMAL HIGH (ref 70–99)
Glucose-Capillary: 207 mg/dL — ABNORMAL HIGH (ref 70–99)
Glucose-Capillary: 161 mg/dL — ABNORMAL HIGH (ref 70–99)
Glucose-Capillary: 158 mg/dL — ABNORMAL HIGH (ref 70–99)

## 2024-10-10 LAB — HEPATIC FUNCTION PANEL
ALT: 20 U/L (ref 0–44)
AST: 42 U/L — ABNORMAL HIGH (ref 15–41)
Albumin: 3.7 g/dL (ref 3.5–5.0)
Alkaline Phosphatase: 119 U/L (ref 38–126)
Bilirubin, Direct: 0.6 mg/dL — ABNORMAL HIGH (ref 0.0–0.2)
Indirect Bilirubin: 0.9 mg/dL (ref 0.3–0.9)
Total Bilirubin: 1.5 mg/dL — ABNORMAL HIGH (ref 0.0–1.2)
Total Protein: 6.5 g/dL (ref 6.5–8.1)

## 2024-10-10 LAB — HEPARIN LEVEL (UNFRACTIONATED)
Heparin Unfractionated: 0.12 [IU]/mL — ABNORMAL LOW (ref 0.30–0.70)
Heparin Unfractionated: 0.13 [IU]/mL — ABNORMAL LOW (ref 0.30–0.70)
Heparin Unfractionated: 0.65 [IU]/mL (ref 0.30–0.70)

## 2024-10-10 LAB — SODIUM: Sodium: 122 mmol/L — ABNORMAL LOW (ref 135–145)

## 2024-10-10 LAB — LACTATE DEHYDROGENASE: LDH: 884 U/L — ABNORMAL HIGH (ref 105–235)

## 2024-10-10 MED ORDER — POTASSIUM CHLORIDE CRYS ER 20 MEQ PO TBCR
40.0000 meq | EXTENDED_RELEASE_TABLET | ORAL | Status: DC
  Filled 2024-10-10: qty 2

## 2024-10-10 MED ORDER — CALCIUM CARBONATE ANTACID 500 MG PO CHEW
1.0000 | CHEWABLE_TABLET | Freq: Once | ORAL | Status: AC
  Administered 2024-10-10: 200 mg via ORAL
  Filled 2024-10-10: qty 1

## 2024-10-10 MED ORDER — POTASSIUM CHLORIDE 20 MEQ PO PACK
40.0000 meq | PACK | ORAL | Status: AC
  Administered 2024-10-10 (×2): 40 meq via ORAL
  Filled 2024-10-10 (×2): qty 2

## 2024-10-10 MED ORDER — TOLVAPTAN 15 MG PO TABS
15.0000 mg | ORAL_TABLET | Freq: Once | ORAL | Status: AC
  Administered 2024-10-10: 15 mg via ORAL
  Filled 2024-10-10: qty 1

## 2024-10-10 MED ORDER — OXIDIZED CELLULOSE EX PADS
1.0000 | MEDICATED_PAD | Freq: Once | CUTANEOUS | Status: AC
  Administered 2024-10-10: 1 via TOPICAL
  Filled 2024-10-10: qty 1

## 2024-10-10 NOTE — Progress Notes (Signed)
 ANTICOAGULATION CONSULT NOTE Pharmacy Consult for heparin   Indication: atrial fibrillation  Brief A/P: . Heparin  level subtherapeutic .Increase Heparin  rate   Allergies[1]  Patient Measurements: Height: 6' 6 (198.1 cm) Weight: 97.3 kg (214 lb 8.1 oz) IBW/kg (Calculated) : 91.4 Heparin  Dosing Weight: 104 kg   Vital Signs: Temp: 98.1 F (36.7 C) (12/27 0300) Temp Source: Core (12/26 2000) BP: 118/71 (12/27 0000) Pulse Rate: 69 (12/27 0300)  Labs: Recent Labs    10/07/24 0452 10/07/24 1047 10/07/24 1857 10/08/24 0312 10/08/24 0440 10/08/24 0515 10/08/24 1203 10/09/24 0523 10/09/24 0542 10/09/24 0557 10/09/24 1700 10/10/24 0201  HGB 15.4   < >  --   --  15.2 16.0  --  14.5 15.3  --   --   --   HCT 42.9   < >  --   --  42.1 47.0  --  40.8 45.0  --   --   --   PLT 96*  --   --   --  75*  --   --  72*  --   --   --   --   APTT  --   --  40*  --   --   --   --   --   --   --   --   --   HEPARINUNFRC 0.21*  --  0.22*   < >  --   --    < > 0.12*  --   --  0.10* 0.12*  CREATININE 2.04*  --   --   --  2.34*  --   --   --   --  2.03*  --   --    < > = values in this interval not displayed.    Estimated Creatinine Clearance: 48.2 mL/min (A) (by C-G formula based on SCr of 2.03 mg/dL (H)).  Assessment: 63 y.o. male s/p Impella 5.5 placement 12/23 with h/o Afib, Eliquis  on hold for heparin    Goal of Therapy:  Heparin  level 0.3-0.5 units/mL Monitor platelets by anticoagulation protocol: Yes   Plan:  Increase heparin  2300 units/hr Check heparin  level in 8 hours.   Cathlyn Arrant, PharmD, BCPS  10/10/2024  3:19 AM         [1] No Known Allergies

## 2024-10-10 NOTE — Progress Notes (Signed)
 3 Days Post-Op Procedures (LRB): RIGHT HEART CATH (N/A) Subjective: No specific complaints. Up in chair. Walked around the ICU today already.  PA 44/16, CVP 3, CI 2.1, Co-ox 79 on milrinone  0.5, Impella P7  Objective: Vital signs in last 24 hours: Temp:  [97.5 F (36.4 C)-98.4 F (36.9 C)] 97.7 F (36.5 C) (12/27 0808) Pulse Rate:  [59-77] 62 (12/27 0808) Cardiac Rhythm: Normal sinus rhythm (12/27 0400) Resp:  [10-25] 13 (12/27 0808) BP: (91-118)/(71-84) 100/82 (12/27 0800) SpO2:  [93 %-99 %] 97 % (12/27 0808) Arterial Line BP: (95-187)/(64-108) 116/76 (12/27 0808) Weight:  [98.8 kg] 98.8 kg (12/27 0500)  Hemodynamic parameters for last 24 hours: PAP: (34-73)/(10-27) 44/16 CVP:  [0 mmHg-26 mmHg] 3 mmHg CO:  [4.9 L/min-5.9 L/min] 5 L/min CI:  [2.1 L/min/m2-2.52 L/min/m2] 2.1 L/min/m2  Intake/Output from previous day: 12/26 0701 - 12/27 0700 In: 1761.8 [P.O.:200; I.V.:1289.6] Out: 2830 [Urine:2830] Intake/Output this shift: Total I/O In: 67.6 [I.V.:56; Other:11.6] Out: -   General appearance: alert and cooperative Heart: regular rate and rhythm Lungs: clear to auscultation bilaterally Wound: Impella site ok  Lab Results: Recent Labs    10/09/24 0523 10/09/24 0542 10/10/24 0502 10/10/24 0508  WBC 7.5  --   --  6.8  HGB 14.5   < > 15.0 13.9  HCT 40.8   < > 44.0 38.7*  PLT 72*  --   --  64*   < > = values in this interval not displayed.   BMET:  Recent Labs    10/09/24 0557 10/10/24 0502 10/10/24 0508  NA 123* 125* 122*  K 3.2* 3.4* 3.3*  CL 86*  --  87*  CO2 25  --  25  GLUCOSE 129*  --  119*  BUN 36*  --  35*  CREATININE 2.03*  --  1.83*  CALCIUM  8.6*  --  8.6*    PT/INR: No results for input(s): LABPROT, INR in the last 72 hours. ABG    Component Value Date/Time   PHART 7.464 (H) 10/10/2024 0502   HCO3 25.6 10/10/2024 0502   TCO2 27 10/10/2024 0502   ACIDBASEDEF 1.0 12/21/2022 1037   ACIDBASEDEF 1.0 12/21/2022 1037   O2SAT 78.8  10/10/2024 0508   CBG (last 3)  Recent Labs    10/09/24 1645 10/09/24 2118 10/10/24 0645  GLUCAP 153* 129* 145*    Assessment/Plan:  Hemodynamics are stable on current support. PA pressures and CVP much better.   AKI with creat peak at 2.34, trending down to 1.83 this am. Baseline about 1.3 on admission. Ideally would like to see him get back close to baseline prior to surgery.  -1068 cc yesterday only on Spiro. Wt up 3.5 lbs if accurate.    LOS: 9 days    James [name] 10/10/2024

## 2024-10-10 NOTE — Progress Notes (Signed)
 ANTICOAGULATION CONSULT NOTE  Pharmacy Consult for heparin   Indication: atrial fibrillation (Eliquis  PTA)  Allergies[1]  Patient Measurements: Height: 6' 6 (198.1 cm) Weight: 98.8 kg (217 lb 13 oz) IBW/kg (Calculated) : 91.4 Heparin  Dosing Weight: 104 kg   Vital Signs: Temp: 98.4 F (36.9 C) (12/27 1900) Temp Source: Core (12/27 1800) BP: 103/75 (12/27 1900) Pulse Rate: 71 (12/27 1900)  Labs: Recent Labs    10/08/24 0440 10/08/24 0515 10/09/24 0523 10/09/24 0542 10/09/24 0557 10/09/24 1700 10/10/24 0201 10/10/24 0502 10/10/24 0508 10/10/24 1008 10/10/24 1237 10/10/24 2215  HGB 15.2   < > 14.5 15.3  --   --   --  15.0 13.9  --   --   --   HCT 42.1   < > 40.8 45.0  --   --   --  44.0 38.7*  --   --   --   PLT 75*  --  72*  --   --   --   --   --  64*  --   --   --   HEPARINUNFRC  --    < > 0.12*  --   --    < > 0.12*  --   --  0.13*  --  0.65  CREATININE 2.34*  --   --   --  2.03*  --   --   --  1.83*  --  1.94*  --    < > = values in this interval not displayed.    Estimated Creatinine Clearance: 50.4 mL/min (A) (by C-G formula based on SCr of 1.94 mg/dL (H)).   Assessment: 73 yoM presents for RHC before planned LVAD implant.  Pharmacy consulted for heparin  dosing while apixaban  on hold (last dose given 12/18 AM). Pt s/p Impella 5.5 12/23, heparin  resumed postop.  Heparin  level 0.13 is subtherapeutic with heparin  running at 2300 units/hr. Hgb (13.9) is stable. PLTs (64) are . Per slowly downtrending. LDH is down to 884. RN, no report of pauses, issues with the line, or signs of bleeding. We have continued to increase rate with very little change in heparin  levels, per RN the arm heparin  is running through has been getting progressively more swollen. Now going to run heparin  through swan.  12/27 PM update:  Heparin  level supra-therapeutic  Goal of Therapy:  Heparin  level 0.3-0.5 units/ml Monitor platelets by anticoagulation protocol: Yes   Plan:  Dec heparin   to 2300 units/hr Repeat heparin  level in 8 hours  Lynwood Mckusick, PharmD, BCPS Clinical Pharmacist Phone: [phone]     [1] No Known Allergies

## 2024-10-10 NOTE — Progress Notes (Signed)
 "    Advanced Heart Failure Rounding Note  Cardiologist: Maude Emmer, MD  [facility] Cardiologist: Dr. Zenaida  Chief Complaint: Chronic systolic heart failure  Patient Profile   James [name] is a 63 y.o. male with history of stage D cardiomyopathy/chronic systolic heart failure now end-stage w/ inotrope dependence, CKD IIIa, hx VT, PAF, prostate cancer, meningioma.    Admitted for LVAD implant/ pre-VAD HF optimization.   Significant events:   12/18: Milrinone  titrated from 0.25 to 0.5 mcg/kg/min post RHC d/t low-output and pulmonary hypertension 12/19: NE added 12/22: Markedly elevated PA pressures, elevated PCWP and elevated SVR. Titrated off NE. Added DBA.  12/23: Impella 5.5 Implanted. DBA stopped   12/25: Low filling pressures off swan. Diuretics held. Albumin  given    Subjective:    Remains on Milrinone  0.5 + Impella 5.5 at P-7, Flow 4.4 L. NE off  Off diuretics.   Feels good. Scr improved.   Co-ox 79%   Sodium down to 122 K 3.3  PAP: (34-73)/(10-27) 44/16 CVP:  [0 mmHg-26 mmHg] 3 mmHg CO:  [4.9 L/min-5.9 L/min] 5 L/min CI:  [2.1 L/min/m2-2.52 L/min/m2] 2.1 L/min/m2   Objective:   Weight Range: 98.8 kg Body mass index is 25.17 kg/m.   Vital Signs:   Temp:  [97.5 F (36.4 C)-98.4 F (36.9 C)] 97.7 F (36.5 C) (12/27 0900) Pulse Rate:  [59-77] 65 (12/27 0900) Resp:  [10-25] 13 (12/27 0900) BP: (91-118)/(71-84) 100/82 (12/27 0800) SpO2:  [93 %-99 %] 96 % (12/27 0900) Arterial Line BP: (95-187)/(64-108) 118/81 (12/27 0900) Weight:  [98.8 kg] 98.8 kg (12/27 0500) Last BM Date : 10/03/24  Weight change: Filed Weights   10/08/24 0705 10/09/24 0500 10/10/24 0500  Weight: 98.2 kg 97.3 kg 98.8 kg    Intake/Output:   Intake/Output Summary (Last 24 hours) at 10/10/2024 0958 Last data filed at 10/10/2024 0900 Gross per 24 hour  Intake 1760.44 ml  Output 2842 ml  Net -1081.56 ml     Physical Exam   General:  Sitting up in chair. No resp  difficulty HEENT: normal Neck: supple. RIJ swan Cor: Regular rate & rhythm. No rubs, gallops or murmurs. Impella site ok  Lungs: clear Abdomen: soft, nontender, nondistended.Good bowel sounds. Extremities: no cyanosis, clubbing, rash, edema Neuro: alert & orientedx3, cranial nerves grossly intact. moves all 4 extremities w/o difficulty. Affect pleasant  Telemetry   NSR 60-70s Personally reviewed   Labs   CBC Recent Labs    10/09/24 0523 10/09/24 0542 10/10/24 0502 10/10/24 0508  WBC 7.5  --   --  6.8  HGB 14.5   < > 15.0 13.9  HCT 40.8   < > 44.0 38.7*  MCV 88.1  --   --  86.8  PLT 72*  --   --  64*   < > = values in this interval not displayed.   Basic Metabolic Panel Recent Labs    87/73/74 0557 10/10/24 0502 10/10/24 0508  NA 123* 125* 122*  K 3.2* 3.4* 3.3*  CL 86*  --  87*  CO2 25  --  25  GLUCOSE 129*  --  119*  BUN 36*  --  35*  CREATININE 2.03*  --  1.83*  CALCIUM  8.6*  --  8.6*  MG 2.1  --  2.1   Liver Function Tests No results for input(s): AST, ALT, ALKPHOS, BILITOT, PROT, ALBUMIN  in the last 72 hours.  No results for input(s): LIPASE, AMYLASE in the last 72 hours. Cardiac Enzymes No  results for input(s): CKTOTAL, CKMB, CKMBINDEX, TROPONINI in the last 72 hours.  BNP: BNP (last 3 results) Recent Labs    06/10/24 1213 07/09/24 1649 07/23/24 0959  BNP 1,071.5* 3,316.0* 595.8*    ProBNP (last 3 results) Recent Labs    10/01/24 1030  PROBNP 8,374.0*     D-Dimer No results for input(s): DDIMER in the last 72 hours. Hemoglobin A1C No results for input(s): HGBA1C in the last 72 hours. Fasting Lipid Panel No results for input(s): CHOL, HDL, LDLCALC, TRIG, CHOLHDL, LDLDIRECT in the last 72 hours. Medications:   Scheduled Medications:  atorvastatin   40 mg Oral Daily   calcium  carbonate  1 tablet Oral BID WC   Chlorhexidine  Gluconate Cloth  6 each Topical Daily   digoxin   0.125 mg Oral QODAY    insulin  aspart  0-15 Units Subcutaneous TID WC   mexiletine  200 mg Oral Q8H   polyethylene glycol  17 g Oral BID   sildenafil   20 mg Oral TID   sodium chloride  flush  10-40 mL Intracatheter Q12H   sodium chloride  flush  3 mL Intravenous Q12H   spironolactone   25 mg Oral QHS    Infusions:  amiodarone  30 mg/hr (10/10/24 0900)   heparin  2,300 Units/hr (10/10/24 0900)   milrinone  0.5 mcg/kg/min (10/10/24 0900)   norepinephrine  (LEVOPHED ) Adult infusion Stopped (10/05/24 1305)   sodium bicarbonate  25 mEq (Impella PURGE) in dextrose  5 % 1000 mL bag      PRN Medications: sodium chloride , acetaminophen , acetaminophen , fentaNYL  (SUBLIMAZE ) injection, ondansetron  (ZOFRAN ) IV, ondansetron , mouth rinse, sodium chloride  flush, sodium chloride  flush, sorbitol   Assessment/Plan   1. Acute on chronic systolic CHF, NYHA IV: Nonischemic cardiomyopathy.  Last echo in 9/25 with EF 10-15%, severe LV dilation, mild RVE with mild-moderate RV dilation, moderate MR, moderate TR, dilated IVC. H/o low output HF, has been on home milrinone  0.25.  Extensive discussion recently re: transplant vs LVAD.  Finally, it appears that patient has settled on LVAD as his preference.  He was brought in for hemodynamic optimization prior to LVAD.  RHC 12/18 showed elevated filling pressures with mod-severe mixed pulmonary arterial/pulmonary venous hypertension and low CO despite milrinone  0.25, intropes escalated. Impella 5.5 placed 12/23.  - Impella at P-7, Flow 4.4L VAD interrogated personally. Parameters stable. - LDH trending down after Impella repositioning - Currently on milrinone  at 0.5. Continue - Swan numbers look good - Continue to hold diuretics today  - D/w Dr. Lucas. LVAD likely next week if SCr continues to improve - Continue to ambulate   2. Pulmonary hypertension: Moderate-severe mixed pulmonary arterial/pulmonary venous hypertension on RHC 12/18.  - Much improved with Impella 5.5 - continue milrinone  and  sildeanfil  3.  AKI on CKD stage 3: Scr 1.7>>1.3>>2.04> 2.3>>2.0 > 1.83 -> 1.94 - Likely cardiorenal syndrome. - continue Impella + milrinone  support and diuresis - Hold diuretics.  - Daily BMET 4.  PVCs/NSVT: Exacerbated by dual inotrope support. Has Biotronik ICD.   - Continue IV amio and mexiletine - Supp K and Mag - Will need to turn off tachy therapies prior to VAD implant.  5.  Paroxysmal Atrial fibrillation: Paroxysmal.   - remains in NSR - Continue amiodarone  as above.  - Eliquis  converted to heparin  gtt in anticipation of surgery - Discussed heparin  dosing with PharmD personally. 6. Meningioma: Monitoring.  7. Prostate cancer: Treated with radiation.  8. Hypokalemia/hypomagnesemia - supp as needed 9. Constipation: advance bowel regimen  10. Hyponatremia - give tolvaptan  today  CRITICAL CARE  Performed by: Allie Ousley  Total critical care time: 34 minutes  Critical care time was exclusive of separately billable procedures and treating other patients.  Critical care was necessary to treat or prevent imminent or life-threatening deterioration.  Critical care was time spent personally by me (independent of midlevel providers or residents) on the following activities: development of treatment plan with patient and/or surrogate as well as nursing, discussions with consultants, evaluation of patient's response to treatment, examination of patient, obtaining history from patient or surrogate, ordering and performing treatments and interventions, ordering and review of laboratory studies, ordering and review of radiographic studies, pulse oximetry and re-evaluation of patient's condition.  Length of Stay: 9  Toribio Fuel, MD  10/10/2024, 9:58 AM  Advanced Heart Failure Team Pager [phone] (M-F; 7a - 5p)   Please visit [email or web address]: For overnight coverage please call cardiology fellow first. If fellow not available call Shock/ECMO MD on call.  For ECMO / Mechanical  Support (Impella, IABP, LVAD) issues call Shock / ECMO MD on call.      "

## 2024-10-10 NOTE — Progress Notes (Signed)
 ANTICOAGULATION CONSULT NOTE  Pharmacy Consult for heparin   Indication: atrial fibrillation (Eliquis  PTA)  Allergies[1]  Patient Measurements: Height: 6' 6 (198.1 cm) Weight: 98.8 kg (217 lb 13 oz) IBW/kg (Calculated) : 91.4 Heparin  Dosing Weight: 104 kg   Vital Signs: Temp: 97.5 F (36.4 C) (12/27 1045) Temp Source: Core (12/27 1000) BP: 100/82 (12/27 0800) Pulse Rate: 61 (12/27 1045)  Labs: Recent Labs    10/07/24 1857 10/08/24 0312 10/08/24 0440 10/08/24 0515 10/09/24 0523 10/09/24 0542 10/09/24 0557 10/09/24 1700 10/10/24 0201 10/10/24 0502 10/10/24 0508 10/10/24 1008  HGB  --   --  15.2   < > 14.5 15.3  --   --   --  15.0 13.9  --   HCT  --   --  42.1   < > 40.8 45.0  --   --   --  44.0 38.7*  --   PLT  --   --  75*  --  72*  --   --   --   --   --  64*  --   APTT 40*  --   --   --   --   --   --   --   --   --   --   --   HEPARINUNFRC 0.22*   < >  --    < > 0.12*  --   --  0.10* 0.12*  --   --  0.13*  CREATININE  --   --  2.34*  --   --   --  2.03*  --   --   --  1.83*  --    < > = values in this interval not displayed.    Estimated Creatinine Clearance: 53.4 mL/min (A) (by C-G formula based on SCr of 1.83 mg/dL (H)).   Assessment: 89 yoM presents for RHC before planned LVAD implant.  Pharmacy consulted for heparin  dosing while apixaban  on hold (last dose given 12/18 AM). Pt s/p Impella 5.5 12/23, heparin  resumed postop.  Heparin  level 0.13 is subtherapeutic with heparin  running at 2300 units/hr. Hgb (13.9) is stable. PLTs (64) are . Per slowly downtrending. LDH is down to 884. RN, no report of pauses, issues with the line, or signs of bleeding. We have continued to increase rate with very little change in heparin  levels, per RN the arm heparin  is running through has been getting progressively more swollen. Now going to run heparin  through swan.  Goal of Therapy:  Heparin  level 0.3-0.5 units/ml Monitor platelets by anticoagulation protocol: Yes   Plan:   Increase heparin  IV to 2400 units/hr Repeat heparin  level in 8h Monitor daily heparin  level, CBC, and   Thank you for allowing pharmacy to be a part of this patients care.   Nidia Schaffer, PharmD PGY2 Cardiology Pharmacy Resident  Please check AMION for all [facility] Pharmacy phone numbers After 10:00 PM, call Main Pharmacy [phone] 10/10/2024    [1] No Known Allergies

## 2024-10-11 DIAGNOSIS — R570 Cardiogenic shock: Secondary | ICD-10-CM | POA: Diagnosis not present

## 2024-10-11 LAB — CBC
HCT: 38.4 % — ABNORMAL LOW (ref 39.0–52.0)
Hemoglobin: 13.7 g/dL (ref 13.0–17.0)
MCH: 31.2 pg (ref 26.0–34.0)
MCHC: 35.7 g/dL (ref 30.0–36.0)
MCV: 87.5 fL (ref 80.0–100.0)
Platelets: 61 K/uL — ABNORMAL LOW (ref 150–400)
RBC: 4.39 MIL/uL (ref 4.22–5.81)
RDW: 15.8 % — ABNORMAL HIGH (ref 11.5–15.5)
WBC: 8.2 K/uL (ref 4.0–10.5)
nRBC: 0 % (ref 0.0–0.2)

## 2024-10-11 LAB — BASIC METABOLIC PANEL WITH GFR
Anion gap: 10 (ref 5–15)
BUN: 33 mg/dL — ABNORMAL HIGH (ref 8–23)
CO2: 25 mmol/L (ref 22–32)
Calcium: 8.7 mg/dL — ABNORMAL LOW (ref 8.9–10.3)
Chloride: 93 mmol/L — ABNORMAL LOW (ref 98–111)
Creatinine, Ser: 1.78 mg/dL — ABNORMAL HIGH (ref 0.61–1.24)
GFR, Estimated: 42 mL/min — ABNORMAL LOW
Glucose, Bld: 129 mg/dL — ABNORMAL HIGH (ref 70–99)
Potassium: 3.4 mmol/L — ABNORMAL LOW (ref 3.5–5.1)
Sodium: 127 mmol/L — ABNORMAL LOW (ref 135–145)

## 2024-10-11 LAB — GLUCOSE, CAPILLARY
Glucose-Capillary: 154 mg/dL — ABNORMAL HIGH (ref 70–99)
Glucose-Capillary: 166 mg/dL — ABNORMAL HIGH (ref 70–99)
Glucose-Capillary: 156 mg/dL — ABNORMAL HIGH (ref 70–99)

## 2024-10-11 LAB — COOXEMETRY PANEL
Carboxyhemoglobin: 2.4 % — ABNORMAL HIGH (ref 0.5–1.5)
Methemoglobin: 0.7 % (ref 0.0–1.5)
O2 Saturation: 80.4 %
Total hemoglobin: 13.9 g/dL (ref 12.0–16.0)

## 2024-10-11 LAB — HEPARIN LEVEL (UNFRACTIONATED): Heparin Unfractionated: 0.27 [IU]/mL — ABNORMAL LOW (ref 0.30–0.70)

## 2024-10-11 LAB — APTT: aPTT: 65 s — ABNORMAL HIGH (ref 24–36)

## 2024-10-11 LAB — DIGOXIN LEVEL: Digoxin Level: 1 ng/mL (ref 0.8–2.0)

## 2024-10-11 LAB — LACTATE DEHYDROGENASE: LDH: 821 U/L — ABNORMAL HIGH (ref 105–235)

## 2024-10-11 LAB — MAGNESIUM: Magnesium: 2.1 mg/dL (ref 1.7–2.4)

## 2024-10-11 MED ORDER — SODIUM CHLORIDE 0.9 % IV SOLN
0.0200 mg/kg/h | INTRAVENOUS | Status: DC
  Administered 2024-10-11: 0.02 mg/kg/h via INTRAVENOUS
  Filled 2024-10-11: qty 250

## 2024-10-11 MED ORDER — POTASSIUM CHLORIDE CRYS ER 20 MEQ PO TBCR
40.0000 meq | EXTENDED_RELEASE_TABLET | Freq: Once | ORAL | Status: AC
  Administered 2024-10-11: 40 meq via ORAL
  Filled 2024-10-11: qty 2

## 2024-10-11 MED ORDER — MELATONIN 3 MG PO TABS
3.0000 mg | ORAL_TABLET | ORAL | Status: DC
  Administered 2024-10-11 – 2024-10-13 (×3): 3 mg via ORAL
  Filled 2024-10-11: qty 1

## 2024-10-11 MED ORDER — POTASSIUM CHLORIDE 20 MEQ PO PACK
40.0000 meq | PACK | ORAL | Status: AC
  Administered 2024-10-11 (×3): 40 meq via ORAL
  Filled 2024-10-11 (×3): qty 2

## 2024-10-11 NOTE — Progress Notes (Signed)
 ANTICOAGULATION CONSULT NOTE  Pharmacy Consult for heparin  >> bivalirudin  Indication: atrial fibrillation (Eliquis  PTA)  Allergies[1]  Patient Measurements: Height: 6' 6 (198.1 cm) Weight: 96.7 kg (213 lb 3 oz) IBW/kg (Calculated) : 91.4 Heparin  Dosing Weight: 104 kg   Vital Signs: Temp: 98.1 F (36.7 C) (12/28 1000) Temp Source: Core (12/28 0800) BP: 123/100 (12/28 1000) Pulse Rate: 72 (12/28 1000)  Labs: Recent Labs    10/09/24 0523 10/09/24 0542 10/10/24 0502 10/10/24 0508 10/10/24 1008 10/10/24 1237 10/10/24 2215 10/11/24 0227 10/11/24 0800  HGB 14.5   < > 15.0 13.9  --   --   --  13.7  --   HCT 40.8   < > 44.0 38.7*  --   --   --  38.4*  --   PLT 72*  --   --  64*  --   --   --  61*  --   HEPARINUNFRC 0.12*   < >  --   --  0.13*  --  0.65  --  0.27*  CREATININE  --    < >  --  1.83*  --  1.94*  --  1.78*  --    < > = values in this interval not displayed.    Estimated Creatinine Clearance: 54.9 mL/min (A) (by C-G formula based on SCr of 1.78 mg/dL (H)).   Assessment: 10 yoM presents for RHC before planned LVAD implant.  Pharmacy consulted for heparin  dosing while apixaban  on hold (last dose given 12/18 AM). Pt s/p Impella 5.5 12/23, heparin  resumed postop.  Heparin  level returned at 0.27 this morning. Per [facility] MD, with continued low platelet count, would like to sent a HIT antibody lab and switch to bivalirudin . Will stop heparin  drip and start bivalirudin  this morning. LDH continues to trend down, Hgb (13.7) is stable. Plts now down to 61. No active bleeding.   Goal of Therapy:  aPTT 50-80s Monitor platelets by anticoagulation protocol: Yes   Plan:  Stop heparin  infusion Start bivalirudin  at 0.02 mg/kg/hr (DW 96.7kg) Check 4 hour aPTT Monitor aPTT, CBC, and signs/symptoms of bleeding F/u HIT antibody and SRA, plan for LVAD implantation next week   Thank you for allowing pharmacy to be a part of this patients care.   Nidia Schaffer, PharmD PGY2  Cardiology Pharmacy Resident  Please check AMION for all [facility] Pharmacy phone numbers After 10:00 PM, call Main Pharmacy [phone] 10/11/2024    [1] No Known Allergies

## 2024-10-11 NOTE — Progress Notes (Signed)
 Patient ID: James [name], male   DOB: 1961-09-19, 63 y.o.   MRN: [id number]     Advanced Heart Failure Rounding Note  Cardiologist: Maude Emmer, MD  [facility] Cardiologist: Dr. Zenaida  Chief Complaint: Chronic systolic heart failure  Patient Profile   James [name] is a 63 y.o. male with history of stage D cardiomyopathy/chronic systolic heart failure now end-stage w/ inotrope dependence, CKD IIIa, hx VT, PAF, prostate cancer, meningioma.    Admitted for LVAD implant/ pre-VAD HF optimization.   Significant events:   12/18: Milrinone  titrated from 0.25 to 0.5 mcg/kg/min post RHC d/t low-output and pulmonary hypertension 12/19: NE added 12/22: Markedly elevated PA pressures, elevated PCWP and elevated SVR. Titrated off NE. Added DBA.  12/23: Impella 5.5 Implanted. DBA stopped   12/25: Low filling pressures off swan. Diuretics held. Albumin  given    Subjective:    Remains on Milrinone  0.5, NE off.   I/Os net negative 2940, had tolvaptan  yesterday.  Na 127 today. Creatinine 1.94 => 1.78.   Has walked in hall.   Swan: CVP 3 PA 47/15 CI 2.05 Co-ox 80%  Impella 5.5 P7 Flow 4.4 L/min LDH 884 => 821 Platelets 64 => 61   Objective:   Weight Range: 96.7 kg Body mass index is 24.64 kg/m.   Vital Signs:   Temp:  [97.3 F (36.3 C)-98.6 F (37 C)] 98.1 F (36.7 C) (12/28 1000) Pulse Rate:  [54-87] 72 (12/28 1000) Resp:  [9-23] 13 (12/28 1000) BP: (99-156)/(75-130) 123/100 (12/28 1000) SpO2:  [88 %-100 %] 100 % (12/28 1000) Arterial Line BP: (109-137)/(66-89) 125/82 (12/28 1000) Weight:  [96.7 kg] 96.7 kg (12/28 0704) Last BM Date : 10/11/24  Weight change: Filed Weights   10/09/24 0500 10/10/24 0500 10/11/24 0704  Weight: 97.3 kg 98.8 kg 96.7 kg    Intake/Output:   Intake/Output Summary (Last 24 hours) at 10/11/2024 1022 Last data filed at 10/11/2024 1000 Gross per 24 hour  Intake 1631.91 ml  Output 4325 ml  Net -2693.09 ml     Physical Exam    General: NAD Neck: No JVD, no thyromegaly or thyroid  nodule.  Lungs: Clear to auscultation bilaterally with normal respiratory effort. CV: Nondisplaced PMI.  Heart regular S1/S2, no S3/S4, no murmur.  No ankle edema.  Left arm swollen.  Abdomen: Soft, nontender, no hepatosplenomegaly, no distention.  Skin: Intact without lesions or rashes.  Neurologic: Alert and oriented x 3.  Psych: Normal affect. Extremities: No clubbing or cyanosis.  HEENT: Normal.   Telemetry   NSR 60-70s Personally reviewed  Labs   CBC Recent Labs    10/10/24 0508 10/11/24 0227  WBC 6.8 8.2  HGB 13.9 13.7  HCT 38.7* 38.4*  MCV 86.8 87.5  PLT 64* 61*   Basic Metabolic Panel Recent Labs    87/72/74 0508 10/10/24 1237 10/10/24 1853 10/11/24 0227  NA 122* 122* 122* 127*  K 3.3* 3.5  --  3.4*  CL 87* 85*  --  93*  CO2 25 25  --  25  GLUCOSE 119* 200*  --  129*  BUN 35* 34*  --  33*  CREATININE 1.83* 1.94*  --  1.78*  CALCIUM  8.6* 9.2  --  8.7*  MG 2.1  --   --  2.1   Liver Function Tests Recent Labs    10/10/24 1237  AST 42*  ALT 20  ALKPHOS 119  BILITOT 1.5*  PROT 6.5  ALBUMIN  3.7    No results for input(s): LIPASE,  AMYLASE in the last 72 hours. Cardiac Enzymes No results for input(s): CKTOTAL, CKMB, CKMBINDEX, TROPONINI in the last 72 hours.  BNP: BNP (last 3 results) Recent Labs    06/10/24 1213 07/09/24 1649 07/23/24 0959  BNP 1,071.5* 3,316.0* 595.8*    ProBNP (last 3 results) Recent Labs    10/01/24 1030  PROBNP 8,374.0*     D-Dimer No results for input(s): DDIMER in the last 72 hours. Hemoglobin A1C No results for input(s): HGBA1C in the last 72 hours. Fasting Lipid Panel No results for input(s): CHOL, HDL, LDLCALC, TRIG, CHOLHDL, LDLDIRECT in the last 72 hours. Medications:   Scheduled Medications:  atorvastatin   40 mg Oral Daily   calcium  carbonate  1 tablet Oral BID WC   Chlorhexidine  Gluconate Cloth  6 each Topical Daily    digoxin   0.125 mg Oral QODAY   insulin  aspart  0-15 Units Subcutaneous TID WC   mexiletine  200 mg Oral Q8H   polyethylene glycol  17 g Oral BID   potassium chloride   40 mEq Oral Q4H   sildenafil   20 mg Oral TID   sodium chloride  flush  10-40 mL Intracatheter Q12H   sodium chloride  flush  3 mL Intravenous Q12H   spironolactone   25 mg Oral QHS    Infusions:  amiodarone  30 mg/hr (10/11/24 1000)   heparin  2,300 Units/hr (10/11/24 1000)   milrinone  0.5 mcg/kg/min (10/11/24 1000)   norepinephrine  (LEVOPHED ) Adult infusion Stopped (10/05/24 1305)   sodium bicarbonate  25 mEq (Impella PURGE) in dextrose  5 % 1000 mL bag      PRN Medications: sodium chloride , acetaminophen , acetaminophen , fentaNYL  (SUBLIMAZE ) injection, ondansetron  (ZOFRAN ) IV, ondansetron , mouth rinse, sodium chloride  flush, sodium chloride  flush, sorbitol   Assessment/Plan   1. Acute on chronic systolic CHF, NYHA IV: Nonischemic cardiomyopathy.  Last echo in 9/25 with EF 10-15%, severe LV dilation, mild RVE with mild-moderate RV dilation, moderate MR, moderate TR, dilated IVC. H/o low output HF, has been on home milrinone  0.25.  Extensive discussion recently re: transplant vs LVAD.  Finally, it appears that patient has settled on LVAD as his preference.  He was brought in for hemodynamic optimization prior to LVAD.  RHC 12/18 showed elevated filling pressures with mod-severe mixed pulmonary arterial/pulmonary venous hypertension and low CO despite milrinone  0.25, intropes escalated. Impella 5.5 placed 12/23.  Currently on Impella P7, flow 4.4 L/min and stable.  LDH is trending down.  Co-ox 80%, CI 2.1 by thermo, CVP 3. He remains on milrinone  0.25, creatinine trending down 1.78.  - Continue milrinone  0.5.  - Continue Impella P7 - Does not need diuretic today.  - Continue current spironolactone  and digoxin .  - Plan for LVAD on Wednesday if creatinine remains stable.  - Continue to ambulate   2. Pulmonary hypertension:  Moderate-severe mixed pulmonary arterial/pulmonary venous hypertension on RHC 12/18. Much improved with Impella 5.5, PASP in 40s now.  - Continue milrinone  0.5 - Continue sildenafil  20 tid.  3.  AKI on CKD stage 3: Scr 1.7>>1.3>>2.04> 2.3>>2.0 > 1.83 -> 1.94 -> 1.78.  Likely cardiorenal syndrome. - continue Impella + milrinone  support 4.  PVCs/NSVT: Exacerbated by dual inotrope support. Has Biotronik ICD.   - Continue IV amio and mexiletine - Supp K and Mag - Will need to turn off tachy therapies prior to VAD implant.  5.  Paroxysmal Atrial fibrillation: Remains in NSR.  - Continue amiodarone  as above.  - Eliquis  converted to heparin  gtt in anticipation of surgery 6. Meningioma: Monitoring.  7. Prostate cancer:  Treated with radiation.  8. Hypokalemia/hypomagnesemia - supp as needed 9. Constipation: advance bowel regimen  10. Hyponatremia: Na 127, has received tolvaptan .  - Fluid restrict.  11. Thrombocytopenia: Slow fall in plts, down to 61 K today.  Impella stable with rare ectopy and no suction, LDH coming down.  With left arm swelling, concern for DVT.  - Check HIT with the above constellation.  - Will transition from heparin  gtt to bivalirudin  for now.  12. LUE swelling: Will get ultrasound today to look for DVT.  Check for HIT.   CRITICAL CARE Performed by: Ezra Shuck  Total critical care time: 40 minutes  Critical care time was exclusive of separately billable procedures and treating other patients.  Critical care was necessary to treat or prevent imminent or life-threatening deterioration.  Critical care was time spent personally by me on the following activities: development of treatment plan with patient and/or surrogate as well as nursing, discussions with consultants, evaluation of patient's response to treatment, examination of patient, obtaining history from patient or surrogate, ordering and performing treatments and interventions, ordering and review of laboratory  studies, ordering and review of radiographic studies, pulse oximetry and re-evaluation of patient's condition.   Length of Stay: 10  Ezra Shuck, MD  10/11/2024, 10:22 AM  Advanced Heart Failure Team Pager [phone] (M-F; 7a - 5p)   Please visit [email or web address]: For overnight coverage please call cardiology fellow first. If fellow not available call Shock/ECMO MD on call.  For ECMO / Mechanical Support (Impella, IABP, LVAD) issues call Shock / ECMO MD on call.

## 2024-10-11 NOTE — Progress Notes (Signed)
 ANTICOAGULATION CONSULT NOTE  Pharmacy Consult for heparin  >> bivalirudin  Indication: atrial fibrillation (Eliquis  PTA)  Allergies[1]  Patient Measurements: Height: 6' 6 (198.1 cm) Weight: 96.7 kg (213 lb 3 oz) IBW/kg (Calculated) : 91.4 Heparin  Dosing Weight: 104 kg   Vital Signs: Temp: 98.2 F (36.8 C) (12/28 1845) Temp Source: Core (12/28 1815) BP: 128/106 (12/28 1812) Pulse Rate: 74 (12/28 1845)  Labs: Recent Labs    10/09/24 0523 10/09/24 0542 10/10/24 0502 10/10/24 0508 10/10/24 1008 10/10/24 1237 10/10/24 2215 10/11/24 0227 10/11/24 0800 10/11/24 1815  HGB 14.5   < > 15.0 13.9  --   --   --  13.7  --   --   HCT 40.8   < > 44.0 38.7*  --   --   --  38.4*  --   --   PLT 72*  --   --  64*  --   --   --  61*  --   --   APTT  --   --   --   --   --   --   --   --   --  65*  HEPARINUNFRC 0.12*   < >  --   --  0.13*  --  0.65  --  0.27*  --   CREATININE  --    < >  --  1.83*  --  1.94*  --  1.78*  --   --    < > = values in this interval not displayed.    Estimated Creatinine Clearance: 54.9 mL/min (A) (by C-G formula based on SCr of 1.78 mg/dL (H)).   Assessment: 64 yoM presents for RHC before planned LVAD implant.  Pharmacy consulted for heparin  dosing while apixaban  on hold (last dose given 12/18 AM). Pt s/p Impella 5.5 12/23, heparin  resumed postop.  Per AHF MD, with continued low platelet count, would like to sent a HIT antibody lab and switch to bivalirudin  this morning. aPTT is therapeutic at 65, on 0.02 mg/kg/hr. No s/sx of bleeding or infusion issues.   Goal of Therapy:  aPTT 50-80s Monitor platelets by anticoagulation protocol: Yes   Plan:  Continue bivalirudin  at 0.02 mg/kg/hr (DW 96.7kg) Check 4 hour aPTT with AM labs Monitor aPTT, CBC, and signs/symptoms of bleeding F/u HIT antibody and SRA, plan for LVAD implantation next week  Thank you for allowing pharmacy to participate in this patient's care,  Suzen Sour, PharmD, BCCCP Clinical  Pharmacist  Phone: [phone] 10/11/2024 7:23 PM  Please check AMION for all [facility] Pharmacy phone numbers After 10:00 PM, call Main Pharmacy [phone]      [1] No Known Allergies

## 2024-10-12 ENCOUNTER — Encounter (HOSPITAL_COMMUNITY): Payer: Self-pay

## 2024-10-12 ENCOUNTER — Inpatient Hospital Stay (HOSPITAL_COMMUNITY)

## 2024-10-12 DIAGNOSIS — R570 Cardiogenic shock: Secondary | ICD-10-CM | POA: Diagnosis not present

## 2024-10-12 DIAGNOSIS — M7989 Other specified soft tissue disorders: Secondary | ICD-10-CM

## 2024-10-12 LAB — GLUCOSE, CAPILLARY
Glucose-Capillary: 160 mg/dL — ABNORMAL HIGH (ref 70–99)
Glucose-Capillary: 167 mg/dL — ABNORMAL HIGH (ref 70–99)
Glucose-Capillary: 160 mg/dL — ABNORMAL HIGH (ref 70–99)
Glucose-Capillary: 177 mg/dL — ABNORMAL HIGH (ref 70–99)
Glucose-Capillary: 116 mg/dL — ABNORMAL HIGH (ref 70–99)

## 2024-10-12 LAB — POCT I-STAT EG7
Acid-Base Excess: 6 mmol/L — ABNORMAL HIGH (ref 0.0–2.0)
Bicarbonate: 31 mmol/L — ABNORMAL HIGH (ref 20.0–28.0)
Calcium, Ion: 1.07 mmol/L — ABNORMAL LOW (ref 1.15–1.40)
HCT: 48 % (ref 39.0–52.0)
Hemoglobin: 16.3 g/dL (ref 13.0–17.0)
O2 Saturation: 70 %
Potassium: 3 mmol/L — ABNORMAL LOW (ref 3.5–5.1)
Sodium: 125 mmol/L — ABNORMAL LOW (ref 135–145)
TCO2: 32 mmol/L (ref 22–32)
pCO2, Ven: 45.2 mmHg (ref 44–60)
pH, Ven: 7.444 — ABNORMAL HIGH (ref 7.25–7.43)
pO2, Ven: 36 mmHg (ref 32–45)

## 2024-10-12 LAB — COOXEMETRY PANEL
Carboxyhemoglobin: 3.1 % — ABNORMAL HIGH (ref 0.5–1.5)
Methemoglobin: <0.7 % (ref 0.0–1.5)
O2 Saturation: 71.9 %
Total hemoglobin: 12.8 g/dL (ref 12.0–16.0)

## 2024-10-12 LAB — CBC
HCT: 37.3 % — ABNORMAL LOW (ref 39.0–52.0)
Hemoglobin: 13 g/dL (ref 13.0–17.0)
MCH: 31.4 pg (ref 26.0–34.0)
MCHC: 34.9 g/dL (ref 30.0–36.0)
MCV: 90.1 fL (ref 80.0–100.0)
Platelets: 62 K/uL — ABNORMAL LOW (ref 150–400)
RBC: 4.14 MIL/uL — ABNORMAL LOW (ref 4.22–5.81)
RDW: 15.9 % — ABNORMAL HIGH (ref 11.5–15.5)
WBC: 8.2 K/uL (ref 4.0–10.5)
nRBC: 0 % (ref 0.0–0.2)

## 2024-10-12 LAB — MAGNESIUM: Magnesium: 2 mg/dL (ref 1.7–2.4)

## 2024-10-12 LAB — BASIC METABOLIC PANEL WITH GFR
Anion gap: 10 (ref 5–15)
BUN: 28 mg/dL — ABNORMAL HIGH (ref 8–23)
CO2: 25 mmol/L (ref 22–32)
Calcium: 9 mg/dL (ref 8.9–10.3)
Chloride: 97 mmol/L — ABNORMAL LOW (ref 98–111)
Creatinine, Ser: 1.65 mg/dL — ABNORMAL HIGH (ref 0.61–1.24)
GFR, Estimated: 46 mL/min — ABNORMAL LOW
Glucose, Bld: 127 mg/dL — ABNORMAL HIGH (ref 70–99)
Potassium: 4.1 mmol/L (ref 3.5–5.1)
Sodium: 132 mmol/L — ABNORMAL LOW (ref 135–145)

## 2024-10-12 LAB — APTT
aPTT: 70 s — ABNORMAL HIGH (ref 24–36)
aPTT: 69 s — ABNORMAL HIGH (ref 24–36)

## 2024-10-12 LAB — LACTATE DEHYDROGENASE: LDH: 839 U/L — ABNORMAL HIGH (ref 105–235)

## 2024-10-12 MED ORDER — ENSURE PLUS HIGH PROTEIN PO LIQD
237.0000 mL | Freq: Two times a day (BID) | ORAL | Status: DC
  Administered 2024-10-12 – 2024-10-13 (×2): 237 mL via ORAL
  Filled 2024-10-12: qty 237

## 2024-10-12 MED ORDER — DIGOXIN 125 MCG PO TABS: 0.0625 mg | ORAL_TABLET | ORAL | Status: DC

## 2024-10-12 NOTE — Progress Notes (Signed)
 "    Advanced Heart Failure Rounding Note  Cardiologist: Maude Emmer, MD  [facility] Cardiologist: Dr. Zenaida Chief Complaint: Chronic systolic heart failure Patient Profile   James [name] is a 63 y.o. male with history of stage D cardiomyopathy/chronic systolic heart failure now end-stage w/ inotrope dependence, CKD IIIa, hx VT, PAF, prostate cancer, meningioma.  Admitted for LVAD implant/ pre-VAD HF optimization.   Significant events:   12/18: Milrinone  titrated from 0.25 to 0.5 mcg/kg/min post RHC d/t low-output and pulmonary hypertension 12/19: NE added 12/22: Markedly elevated PA pressures, elevated PCWP and elevated SVR. Titrated off NE. Added DBA.  12/23: Impella 5.5 Implanted. DBA stopped   12/25: Low filling pressures off swan. Diuretics held. Albumin  given   Subjective:    Impella 5.5 @ P7 with Flow ~4 LPM  Remains on milrinone  0.5 mcg/kg/min. Net negative 1L. Weight down. sCr 1.94 > 1.78 > 1.65  LDH 800s. Plts stable 60k's. On bival. SAR pending   [place]wan: PAP: (34-95)/(8-51) 57/24 CVP:  [0 mmHg-19 mmHg] 0 mmHg CO:  [4.9 L/min-5.6 L/min] 4.9 L/min CI:  [2.1 L/min/m2-2.4 L/min/m2] 2.1 L/min/m2 PCWP: 25 Co-ox 72%  Sitting up in chair. Feeling well, no SOB or pain. Has been ambulating.   Objective:    Weight Range: 95.7 kg Body mass index is 24.38 kg/m.   Vital Signs:   Temp:  [92.5 F (33.6 C)-98.8 F (37.1 C)] 98.2 F (36.8 C) (12/29 0745) Pulse Rate:  [56-96] 83 (12/29 0745) Resp:  [9-32] 19 (12/29 0745) BP: (99-128)/(79-106) 113/86 (12/29 0600) SpO2:  [71 %-100 %] 98 % (12/29 0745) Arterial Line BP: (108-153)/(64-100) 153/88 (12/29 0745) Weight:  [95.7 kg] 95.7 kg (12/29 0703) Last BM Date : 10/11/24  Weight change: Filed Weights   10/10/24 0500 10/11/24 0704 10/12/24 0703  Weight: 98.8 kg 96.7 kg 95.7 kg   Intake/Output:  Intake/Output Summary (Last 24 hours) at 10/12/2024 0804 Last data filed at 10/12/2024 0700 Gross per 24 hour  Intake  1530.22 ml  Output 2722 ml  Net -1191.78 ml    Physical Exam   General: Well appearing. No distress  Cardiac: JVP flat. No murmurs  Extremities: Warm and dry.  No peripheral edema.  Neuro: A&O x3. Affect pleasant.   Telemetry   SR 80s, PVCs (personally reviewed)  Labs   CBC Recent Labs    10/11/24 0227 10/12/24 0533  WBC 8.2 8.2  HGB 13.7 13.0  HCT 38.4* 37.3*  MCV 87.5 90.1  PLT 61* 62*   Basic Metabolic Panel Recent Labs    87/71/74 0227 10/12/24 0533  NA 127* 132*  K 3.4* 4.1  CL 93* 97*  CO2 25 25  GLUCOSE 129* 127*  BUN 33* 28*  CREATININE 1.78* 1.65*  CALCIUM  8.7* 9.0  MG 2.1 2.0   Liver Function Tests Recent Labs    10/10/24 1237  AST 42*  ALT 20  ALKPHOS 119  BILITOT 1.5*  PROT 6.5  ALBUMIN  3.7   BNP (last 3 results) Recent Labs    06/10/24 1213 07/09/24 1649 07/23/24 0959  BNP 1,071.5* 3,316.0* 595.8*   ProBNP (last 3 results) Recent Labs    10/01/24 1030  PROBNP 8,374.0*   Medications:    Scheduled Medications:  atorvastatin   40 mg Oral Daily   calcium  carbonate  1 tablet Oral BID WC   Chlorhexidine  Gluconate Cloth  6 each Topical Daily   digoxin   0.125 mg Oral QODAY   insulin  aspart  0-15 Units Subcutaneous TID WC  melatonin  3 mg Oral Q24H   mexiletine  200 mg Oral Q8H   polyethylene glycol  17 g Oral BID   sildenafil   20 mg Oral TID   sodium chloride  flush  10-40 mL Intracatheter Q12H   sodium chloride  flush  3 mL Intravenous Q12H   spironolactone   25 mg Oral QHS    Infusions:  amiodarone  30 mg/hr (10/12/24 0606)   bivalirudin  (ANGIOMAX ) 250 mg in sodium chloride  0.9 % 500 mL (0.5 mg/mL) infusion 0.02 mg/kg/hr (10/12/24 0606)   milrinone  0.5 mcg/kg/min (10/12/24 0723)   sodium bicarbonate  25 mEq (Impella PURGE) in dextrose  5 % 1000 mL bag      PRN Medications: sodium chloride , acetaminophen , acetaminophen , fentaNYL  (SUBLIMAZE ) injection, ondansetron  (ZOFRAN ) IV, ondansetron , mouth rinse, sodium chloride  flush,  sodium chloride  flush, sorbitol   Assessment/Plan   1. Acute on chronic systolic CHF, NYHA IV: Nonischemic cardiomyopathy.  Last echo in 9/25 with EF 10-15%, severe LV dilation, mild RVE with mild-moderate RV dilation, moderate MR, moderate TR, dilated IVC. H/o low output HF, has been on home milrinone  0.25.  Extensive discussion recently re: transplant vs LVAD.  Finally, it appears that patient has settled on LVAD as his preference.  He was brought in for hemodynamic optimization prior to LVAD.  RHC 12/18 showed elevated filling pressures with mod-severe mixed pulmonary arterial/pulmonary venous hypertension and low CO despite milrinone  0.25, increased to 0.5. Impella 5.5 placed 12/23.   - Currently on Impella P7, flow 4.1 LPM. Stable. Continue - Co-ox 72%, CI 2.1 by thermo, CVP 3. Creatinine trending down 1.65.  - Continue milrinone  0.5; plan to wean to 0.375 this afternoon. - PCWP elevated, however weight is the lowest it has been this admission.  - Stop sildenafil  and digoxin . Continue spiro 25 mg daily.  - Plan for LVAD on Wednesday if creatinine remains stable.  - Continue to ambulate    2. Pulmonary hypertension: Moderate-severe mixed pulmonary arterial/pulmonary venous hypertension on RHC 12/18. Much improved with Impella 5.5, PASP in 40s now.  - Continue milrinone  0.5 - Stop sildenafil   3.  AKI on CKD stage 3: Scr up to 2.3, trending down 1.65 today.  Likely cardiorenal syndrome. - continue Impella + milrinone  support  4.  PVCs/NSVT: Exacerbated by dual inotrope support. Has Biotronik ICD.   - Continue IV amio and mexiletine 200 mg tid - Supp K and Mag - Plan to turn off tachy therapies tonight for VAD implant.   5.  Paroxysmal Atrial fibrillation: Remains in NSR.  - Continue amiodarone  as above.  - Eliquis  converted to heparin  gtt in anticipation of surgery  6. Meningioma: Monitoring.   7. Prostate cancer: Treated with radiation.   8. Hypokalemia/hypomagnesemia - supp as  needed  9. Constipation: advance bowel regimen. Having BMs  10. Hyponatremia: Na 132, has received tolvaptan .  - Fluid restrict.   11. Thrombocytopenia: Slow fall in plts, down to 61 K today.  Impella stable with rare ectopy and no suction, LDH coming down.  With left arm swelling, concern for DVT.  - Check HIT with the above constellation. SAR pending.  - continue bival for now.  12. LUE swelling: DVT study for LUE ordered.  Check for HIT.   CRITICAL CARE Performed by: Nicloe Frontera  Total critical care time: 8 minutes  -Critical care time was exclusive of separately billable procedures and treating other patients. -Critical care was necessary to treat or prevent imminent or life-threatening deterioration. -Critical care was time spent personally by me on the  following activities: development of treatment plan with patient and/or surrogate as well as nursing, discussions with consultants, evaluation of patient's response to treatment, examination of patient, obtaining history from patient or surrogate, ordering and performing treatments and interventions, ordering and review of laboratory studies, ordering and review of radiographic studies, pulse oximetry and re-evaluation of patient's condition.  Length of Stay: 33  Chamya Hunton, NP  10/12/2024, 8:04 AM  Advanced Heart Failure Team Pager [phone] (M-F; 7a - 5p)   Please visit [email or web address]: For overnight coverage please call cardiology fellow first. If fellow not available call Shock/ECMO MD on call.  For ECMO / Mechanical Support (Impella, IABP, LVAD) issues call Shock / ECMO MD on call.    "

## 2024-10-12 NOTE — Progress Notes (Signed)
 ANTICOAGULATION CONSULT NOTE  Pharmacy Consult for heparin  >> bivalirudin  Indication: atrial fibrillation (Eliquis  PTA)  Allergies[1]  Patient Measurements: Height: 6' 6 (198.1 cm) Weight: 95.7 kg (210 lb 15.7 oz) IBW/kg (Calculated) : 91.4 Heparin  Dosing Weight: 104 kg   Vital Signs: Temp: 98.2 F (36.8 C) (12/29 0745) BP: 113/86 (12/29 0600) Pulse Rate: 83 (12/29 0745)  Labs: Recent Labs    10/10/24 0508 10/10/24 1008 10/10/24 1237 10/10/24 2215 10/11/24 0227 10/11/24 0800 10/11/24 1815 10/12/24 0533  HGB 13.9  --   --   --  13.7  --   --  13.0  HCT 38.7*  --   --   --  38.4*  --   --  37.3*  PLT 64*  --   --   --  61*  --   --  62*  APTT  --   --   --   --   --   --  65* 70*  HEPARINUNFRC  --  0.13*  --  0.65  --  0.27*  --   --   CREATININE 1.83*  --  1.94*  --  1.78*  --   --  1.65*    Estimated Creatinine Clearance: 59.2 mL/min (A) (by C-G formula based on SCr of 1.65 mg/dL (H)).   Assessment: 58 yoM presents for RHC before planned LVAD implant.  Pharmacy consulted for heparin  dosing while apixaban  on hold (last dose given 12/18 AM). Pt s/p Impella 5.5 12/23, heparin  resumed postop.  Per AHF MD, with continued low platelet count, would like to sent a HIT antibody lab and switch to bivalirudin  on 12/28. aPTT is therapeutic at 70, on 0.02 mg/kg/hr. Hgb stable at 13, plt stabilized but still low at 62. No s/sx of bleeding or infusion issues.   Goal of Therapy:  aPTT 50-80s Monitor platelets by anticoagulation protocol: Yes   Plan:  Continue bivalirudin  at 0.02 mg/kg/hr (DW 96.7kg) Monitor aPTT, CBC, and signs/symptoms of bleeding F/u HIT antibody and SRA (still in process), plan for LVAD implantation this week potentially pending labs   Thank you for allowing pharmacy to participate in this patient's care,  Suzen Sour, PharmD, BCCCP Clinical Pharmacist  Phone: [phone] 10/12/2024 8:06 AM  Please check AMION for all [facility] Pharmacy phone numbers After  10:00 PM, call Main Pharmacy [phone]       [1] No Known Allergies

## 2024-10-12 NOTE — Progress Notes (Signed)
 ANTICOAGULATION CONSULT NOTE  Pharmacy Consult for heparin  >> bivalirudin  Indication: atrial fibrillation (Eliquis  PTA)  Allergies[1]  Patient Measurements: Height: 6' 6 (198.1 cm) Weight: 95.7 kg (210 lb 15.7 oz) IBW/kg (Calculated) : 91.4 Heparin  Dosing Weight: 104 kg   Vital Signs: Temp: 98.4 F (36.9 C) (12/29 1815) BP: 114/89 (12/29 1800) Pulse Rate: 75 (12/29 1815)  Labs: Recent Labs    10/10/24 0508 10/10/24 1008 10/10/24 1237 10/10/24 2215 10/11/24 0227 10/11/24 0800 10/11/24 1815 10/12/24 0533 10/12/24 1727  HGB 13.9  --   --   --  13.7  --   --  13.0  --   HCT 38.7*  --   --   --  38.4*  --   --  37.3*  --   PLT 64*  --   --   --  61*  --   --  62*  --   APTT  --   --   --   --   --   --  65* 70* 69*  HEPARINUNFRC  --  0.13*  --  0.65  --  0.27*  --   --   --   CREATININE 1.83*  --  1.94*  --  1.78*  --   --  1.65*  --     Estimated Creatinine Clearance: 59.2 mL/min (A) (by C-G formula based on SCr of 1.65 mg/dL (H)).   Assessment: 36 yoM presents for RHC before planned LVAD implant.  Pharmacy consulted for heparin  dosing while apixaban  on hold (last dose given 12/18 AM). Pt s/p Impella 5.5 12/23, heparin  resumed postop.  Per AHF MD, with continued low platelet count, would like to sent a HIT antibody lab and switch to bivalirudin  on 12/28. aPTT is therapeutic at 70, on 0.02 mg/kg/hr. Hgb stable at 13, plt stabilized but still low at 62. No s/sx of bleeding or infusion issues.   12/29 PM Update: aPTT therapeutic at 69 on current rate.  No issues noted.  HIT ab remains in-process.   Goal of Therapy:  aPTT 50-80s Monitor platelets by anticoagulation protocol: Yes   Plan:  Continue bivalirudin  at 0.02 mg/kg/hr (DW 96.7kg) Monitor aPTT, CBC, and signs/symptoms of bleeding F/u HIT antibody and SRA (still in process), plan for LVAD implantation this week potentially pending labs   Thank you for allowing pharmacy to participate in this patient's  care,  Harlene Boga, PharmD 10/12/2024 6:48 PM  Please check AMION for all [facility] Pharmacy phone numbers After 10:00 PM, call Main Pharmacy [phone]       [1] No Known Allergies

## 2024-10-12 NOTE — Progress Notes (Signed)
 VASCULAR LAB    Left upper extremity venous duplex has been performed.  See CV proc for preliminary results.   Alexius Hangartner, RVT 10/12/2024, 11:37 AM

## 2024-10-12 NOTE — TOC Progression Note (Signed)
 Transition of Care [facility]) - Progression Note    Patient Details  Name: James [name] MRN: [id number] Date of Birth: 04/10/1961  [facility]) CM/SW Contact  Justina Delcia Czar, RN Phone Number:  [phone] 10/12/2024, 12:39 PM  Clinical Narrative:     Plan LVAD on 10/14/2024.   Chart reviewed for discharge readiness, patient not medically stable for d/c. Inpatient CM/CSW will continue to monitor pt's advancement through interdisciplinary progression rounds.  If new pt transition needs arise, MD please place a TOC consult.    Expected Discharge Plan: IP Rehab Facility Barriers to Discharge: Continued Medical Work up    Expected Discharge Plan and Services   Discharge Planning Services: CM Consult Post Acute Care Choice: Home Health Living arrangements for the past 2 months: Single Family Home     Social Drivers of Health (SDOH) Interventions SDOH Screenings   Food Insecurity: No Food Insecurity (10/06/2024)  Housing: Low Risk (10/06/2024)  Transportation Needs: No Transportation Needs (10/06/2024)  Utilities: Not At Risk (10/06/2024)  Alcohol Screen: Low Risk (11/18/2023)  Depression (PHQ2-9): Low Risk (08/21/2024)  Tobacco Use: Medium Risk (09/17/2024)    Readmission Risk Interventions    07/10/2024   11:05 AM 10/10/2023   10:37 AM  Readmission Risk Prevention Plan  Transportation Screening Complete Complete  Home Care Screening Complete Complete  Medication Review (RN CM) Complete Complete

## 2024-10-12 NOTE — Plan of Care (Signed)
" °  Problem: Education: Goal: Understanding of CV disease, CV risk reduction, and recovery process will improve Outcome: Progressing   Problem: Activity: Goal: Ability to return to baseline activity level will improve Outcome: Progressing   Problem: Cardiovascular: Goal: Ability to achieve and maintain adequate cardiovascular perfusion will improve Outcome: Progressing Goal: Vascular access site(s) Level 0-1 will be maintained Outcome: Progressing   Problem: Health Behavior/Discharge Planning: Goal: Ability to safely manage health-related needs after discharge will improve Outcome: Progressing   Problem: Education: Goal: Knowledge of General Education information will improve Description: Including pain rating scale, medication(s)/side effects and non-pharmacologic comfort measures Outcome: Progressing   Problem: Health Behavior/Discharge Planning: Goal: Ability to manage health-related needs will improve Outcome: Progressing   Problem: Clinical Measurements: Goal: Ability to maintain clinical measurements within normal limits will improve Outcome: Progressing Goal: Will remain free from infection Outcome: Progressing Goal: Diagnostic test results will improve Outcome: Progressing Goal: Respiratory complications will improve Outcome: Progressing Goal: Cardiovascular complication will be avoided Outcome: Progressing   Problem: Activity: Goal: Risk for activity intolerance will decrease Outcome: Progressing   Problem: Nutrition: Goal: Adequate nutrition will be maintained Outcome: Progressing   Problem: Elimination: Goal: Will not experience complications related to bowel motility Outcome: Progressing Goal: Will not experience complications related to urinary retention Outcome: Progressing   Problem: Pain Managment: Goal: General experience of comfort will improve and/or be controlled Outcome: Progressing   "

## 2024-10-13 DIAGNOSIS — R570 Cardiogenic shock: Secondary | ICD-10-CM | POA: Diagnosis not present

## 2024-10-13 DIAGNOSIS — Z95811 Presence of heart assist device: Secondary | ICD-10-CM | POA: Diagnosis not present

## 2024-10-13 DIAGNOSIS — I5023 Acute on chronic systolic (congestive) heart failure: Secondary | ICD-10-CM | POA: Diagnosis not present

## 2024-10-13 DIAGNOSIS — Z95810 Presence of automatic (implantable) cardiac defibrillator: Secondary | ICD-10-CM | POA: Diagnosis not present

## 2024-10-13 LAB — CBC
HCT: 34.5 % — ABNORMAL LOW (ref 39.0–52.0)
Hemoglobin: 12.1 g/dL — ABNORMAL LOW (ref 13.0–17.0)
MCH: 31.3 pg (ref 26.0–34.0)
MCHC: 35.1 g/dL (ref 30.0–36.0)
MCV: 89.4 fL (ref 80.0–100.0)
Platelets: 62 K/uL — ABNORMAL LOW (ref 150–400)
RBC: 3.86 MIL/uL — ABNORMAL LOW (ref 4.22–5.81)
RDW: 16.1 % — ABNORMAL HIGH (ref 11.5–15.5)
WBC: 8 K/uL (ref 4.0–10.5)
nRBC: 0 % (ref 0.0–0.2)

## 2024-10-13 LAB — BASIC METABOLIC PANEL WITH GFR
Anion gap: 9 (ref 5–15)
BUN: 27 mg/dL — ABNORMAL HIGH (ref 8–23)
CO2: 24 mmol/L (ref 22–32)
Calcium: 8.8 mg/dL — ABNORMAL LOW (ref 8.9–10.3)
Chloride: 95 mmol/L — ABNORMAL LOW (ref 98–111)
Creatinine, Ser: 1.67 mg/dL — ABNORMAL HIGH (ref 0.61–1.24)
GFR, Estimated: 46 mL/min — ABNORMAL LOW
Glucose, Bld: 192 mg/dL — ABNORMAL HIGH (ref 70–99)
Potassium: 3.8 mmol/L (ref 3.5–5.1)
Sodium: 128 mmol/L — ABNORMAL LOW (ref 135–145)

## 2024-10-13 LAB — COOXEMETRY PANEL
Carboxyhemoglobin: 2.5 % — ABNORMAL HIGH (ref 0.5–1.5)
Methemoglobin: 0.7 % (ref 0.0–1.5)
O2 Saturation: 73.8 %
Total hemoglobin: 12 g/dL (ref 12.0–16.0)

## 2024-10-13 LAB — APTT
aPTT: 57 s — ABNORMAL HIGH (ref 24–36)
aPTT: 50 s — ABNORMAL HIGH (ref 24–36)

## 2024-10-13 LAB — GLUCOSE, CAPILLARY
Glucose-Capillary: 192 mg/dL — ABNORMAL HIGH (ref 70–99)
Glucose-Capillary: 148 mg/dL — ABNORMAL HIGH (ref 70–99)
Glucose-Capillary: 70 mg/dL (ref 70–99)
Glucose-Capillary: 149 mg/dL — ABNORMAL HIGH (ref 70–99)

## 2024-10-13 LAB — MAGNESIUM: Magnesium: 1.7 mg/dL (ref 1.7–2.4)

## 2024-10-13 LAB — PHOSPHORUS: Phosphorus: 1.9 mg/dL — ABNORMAL LOW (ref 2.5–4.6)

## 2024-10-13 LAB — LACTATE DEHYDROGENASE: LDH: 839 U/L — ABNORMAL HIGH (ref 105–235)

## 2024-10-13 LAB — HEPARIN INDUCED PLATELET AB (HIT ANTIBODY): Heparin Induced Plt Ab: 0.515 {OD_unit} — ABNORMAL HIGH (ref 0.000–0.400)

## 2024-10-13 MED ORDER — TRANEXAMIC ACID (OHS) PUMP PRIME SOLUTION
2.0000 mg/kg | INTRAVENOUS | Status: DC
  Filled 2024-10-13: qty 1.93

## 2024-10-13 MED ORDER — NITROGLYCERIN IN D5W 200-5 MCG/ML-% IV SOLN
0.0000 ug/min | INTRAVENOUS | Status: DC
  Filled 2024-10-13: qty 250

## 2024-10-13 MED ORDER — MAGNESIUM SULFATE 2 GM/50ML IV SOLN
2.0000 g | Freq: Once | INTRAVENOUS | Status: AC
  Administered 2024-10-13: 2 g via INTRAVENOUS
  Filled 2024-10-13: qty 50

## 2024-10-13 MED ORDER — CHLORHEXIDINE GLUCONATE 0.12 % MT SOLN
15.0000 mL | Freq: Once | OROMUCOSAL | Status: AC
  Administered 2024-10-14: 15 mL via OROMUCOSAL
  Filled 2024-10-13: qty 15

## 2024-10-13 MED ORDER — PHENYLEPHRINE HCL-NACL 20-0.9 MG/250ML-% IV SOLN
0.0000 ug/min | INTRAVENOUS | Status: DC
  Filled 2024-10-13: qty 250

## 2024-10-13 MED ORDER — CHLORHEXIDINE GLUCONATE CLOTH 2 % EX PADS: 6.0000 | MEDICATED_PAD | Freq: Once | CUTANEOUS | Status: DC

## 2024-10-13 MED ORDER — TEMAZEPAM 7.5 MG PO CAPS: 15.0000 mg | ORAL_CAPSULE | Freq: Once | ORAL | Status: DC | PRN

## 2024-10-13 MED ORDER — DOBUTAMINE-DEXTROSE 4-5 MG/ML-% IV SOLN
2.0000 ug/kg/min | INTRAVENOUS | Status: DC
  Filled 2024-10-13: qty 250

## 2024-10-13 MED ORDER — VASOPRESSIN 20 UNITS/100 ML INFUSION FOR SHOCK
0.0400 [IU]/min | INTRAVENOUS | Status: DC
  Filled 2024-10-13: qty 100

## 2024-10-13 MED ORDER — DOPAMINE-DEXTROSE 3.2-5 MG/ML-% IV SOLN
0.0000 ug/kg/min | INTRAVENOUS | Status: DC
  Filled 2024-10-13: qty 250

## 2024-10-13 MED ORDER — NOREPINEPHRINE 4 MG/250ML-% IV SOLN
0.0000 ug/min | INTRAVENOUS | Status: AC
  Administered 2024-10-14: 2 ug/min via INTRAVENOUS
  Filled 2024-10-13: qty 250

## 2024-10-13 MED ORDER — SODIUM CHLORIDE 0.9 % IV SOLN
600.0000 mg | INTRAVENOUS | Status: AC
  Administered 2024-10-14: 600 mg via INTRAVENOUS
  Filled 2024-10-13: qty 10

## 2024-10-13 MED ORDER — TRANEXAMIC ACID 1000 MG/10ML IV SOLN
1.5000 mg/kg/h | INTRAVENOUS | Status: AC
  Administered 2024-10-14: 1.5 mg/kg/h via INTRAVENOUS
  Filled 2024-10-13: qty 25

## 2024-10-13 MED ORDER — VANCOMYCIN HCL 1.5 G IV SOLR
1500.0000 mg | INTRAVENOUS | Status: AC
  Administered 2024-10-14: 1500 mg via INTRAVENOUS
  Filled 2024-10-13: qty 30

## 2024-10-13 MED ORDER — POTASSIUM CHLORIDE CRYS ER 20 MEQ PO TBCR: 20.0000 meq | EXTENDED_RELEASE_TABLET | Freq: Once | ORAL | Status: DC

## 2024-10-13 MED ORDER — HEPARIN 30,000 UNITS/1000 ML (OHS) CELLSAVER SOLUTION
Status: DC
  Filled 2024-10-13: qty 1000

## 2024-10-13 MED ORDER — FLUCONAZOLE IN SODIUM CHLORIDE 400-0.9 MG/200ML-% IV SOLN
400.0000 mg | INTRAVENOUS | Status: AC
  Administered 2024-10-14: 400 mg via INTRAVENOUS
  Filled 2024-10-13: qty 200

## 2024-10-13 MED ORDER — TRANEXAMIC ACID (OHS) BOLUS VIA INFUSION
15.0000 mg/kg | INTRAVENOUS | Status: AC
  Administered 2024-10-14: 1447.5 mg via INTRAVENOUS
  Filled 2024-10-13: qty 1448

## 2024-10-13 MED ORDER — POTASSIUM CHLORIDE 2 MEQ/ML IV SOLN
80.0000 meq | INTRAVENOUS | Status: DC
  Filled 2024-10-13: qty 40

## 2024-10-13 MED ORDER — VANCOMYCIN HCL 1 G IV SOLR
1000.0000 mg | INTRAVENOUS | Status: DC
  Filled 2024-10-13: qty 20

## 2024-10-13 MED ORDER — CHLORHEXIDINE GLUCONATE CLOTH 2 % EX PADS
6.0000 | MEDICATED_PAD | Freq: Once | CUTANEOUS | Status: AC
  Administered 2024-10-13: 6 via TOPICAL

## 2024-10-13 MED ORDER — DIAZEPAM 5 MG PO TABS
5.0000 mg | ORAL_TABLET | Freq: Once | ORAL | Status: AC
  Administered 2024-10-14: 5 mg via ORAL
  Filled 2024-10-13: qty 1

## 2024-10-13 MED ORDER — CEFAZOLIN SODIUM-DEXTROSE 2-4 GM/100ML-% IV SOLN
2.0000 g | INTRAVENOUS | Status: DC
  Filled 2024-10-13: qty 100

## 2024-10-13 MED ORDER — MAGNESIUM SULFATE 50 % IJ SOLN
40.0000 meq | INTRAMUSCULAR | Status: DC
  Filled 2024-10-13: qty 9.85

## 2024-10-13 MED ORDER — EPINEPHRINE HCL 5 MG/250ML IV SOLN IN NS
0.0000 ug/min | INTRAVENOUS | Status: AC
  Administered 2024-10-14: 2 ug/min via INTRAVENOUS
  Filled 2024-10-13: qty 250

## 2024-10-13 MED ORDER — DEXMEDETOMIDINE HCL IN NACL 400 MCG/100ML IV SOLN
0.1000 ug/kg/h | INTRAVENOUS | Status: AC
  Administered 2024-10-14: .5 ug/kg/h via INTRAVENOUS
  Filled 2024-10-13: qty 100

## 2024-10-13 MED ORDER — CEFAZOLIN SODIUM-DEXTROSE 2-4 GM/100ML-% IV SOLN
2.0000 g | INTRAVENOUS | Status: AC
  Administered 2024-10-14 (×2): 2 g via INTRAVENOUS
  Filled 2024-10-13: qty 100

## 2024-10-13 MED ORDER — POTASSIUM CHLORIDE 20 MEQ PO PACK
20.0000 meq | PACK | Freq: Once | ORAL | Status: AC
  Administered 2024-10-13: 20 meq via ORAL
  Filled 2024-10-13: qty 1

## 2024-10-13 MED ORDER — MUPIROCIN 2 % EX OINT
1.0000 | TOPICAL_OINTMENT | Freq: Two times a day (BID) | CUTANEOUS | Status: DC
  Administered 2024-10-13: 1 via NASAL

## 2024-10-13 MED ORDER — INSULIN REGULAR(HUMAN) IN NACL 100-0.9 UT/100ML-% IV SOLN
INTRAVENOUS | Status: AC
  Administered 2024-10-14: 3 [IU]/h via INTRAVENOUS
  Filled 2024-10-13: qty 100

## 2024-10-13 MED ORDER — BISACODYL 5 MG PO TBEC
5.0000 mg | DELAYED_RELEASE_TABLET | Freq: Once | ORAL | Status: AC
  Administered 2024-10-13: 5 mg via ORAL
  Filled 2024-10-13: qty 1

## 2024-10-13 MED ORDER — MILRINONE LACTATE IN DEXTROSE 20-5 MG/100ML-% IV SOLN
0.3000 ug/kg/min | INTRAVENOUS | Status: AC
  Administered 2024-10-14: .375 ug/kg/min via INTRAVENOUS
  Filled 2024-10-13: qty 100

## 2024-10-13 MED ORDER — SODIUM CHLORIDE 0.9 % IV SOLN
600.0000 mg | INTRAVENOUS | Status: DC
  Filled 2024-10-13: qty 10

## 2024-10-13 NOTE — Progress Notes (Signed)
 Patient had a very restless night.He declined ambulation this morning x2  stating that he barley got any sleep and would like to wait until after breakfast to walk.   Will pass on this request to dayshift RN.

## 2024-10-13 NOTE — Anesthesia Preprocedure Evaluation (Signed)
 "                                  Anesthesia Evaluation  Patient identified by MRN, date of birth, ID band Patient awake    Reviewed: Allergy & Precautions, NPO status , Patient's Chart, lab work & pertinent test results  Airway Mallampati: II  TM Distance: >3 FB Neck ROM: Full    Dental no notable dental hx.    Pulmonary asthma , sleep apnea , COPD, former smoker   Pulmonary exam normal        Cardiovascular hypertension, +CHF   Rhythm:Regular Rate:Normal  ECHO 10/01/24: IMPRESSIONS   1. Left ventricular ejection fraction, by estimation, is <20%. The left  ventricle has severely decreased function. The left ventricle demonstrates  global hypokinesis. The left ventricular internal cavity size was  moderately dilated. Left ventricular  diastolic parameters are consistent with Grade III diastolic dysfunction  (restrictive). Elevated left ventricular end-diastolic pressure.   2. Right ventricular systolic function is moderately reduced. The right  ventricular size is moderately enlarged. There is normal pulmonary artery  systolic pressure.   3. Left atrial size was severely dilated.   4. Catheter present. Right atrial size was severely dilated.   5. The mitral valve is normal in structure. Mild to moderate mitral valve  regurgitation. No evidence of mitral stenosis.   6. The aortic valve is tricuspid. Aortic valve regurgitation is not  visualized. No aortic stenosis is present.   7. Aortic dilatation noted. There is moderate dilatation of the aortic  root, measuring 47 mm.   8. The inferior vena cava is dilated in size with <50% respiratory  variability, suggesting right atrial pressure of 15 mmHg.    Cath 12/24: On Impella 5.5   RA = 5 RV = 40/6 PA =  42/20 (27) PCW = 15 Fick cardiac output/index = 6.4/2.8 PVR = 1.9 WU Ao sat = 92% PA sat = 68%, 69% PAPi = 4.4    Neuro/Psych negative neurological ROS  negative psych ROS   GI/Hepatic negative  GI ROS, Neg liver ROS,,,  Endo/Other  diabetes    Renal/GU CRFRenal disease  negative genitourinary   Musculoskeletal negative musculoskeletal ROS (+)    Abdominal Normal abdominal exam  (+)   Peds  Hematology Lab Results      Component                Value               Date                      WBC                      8.0                 10/13/2024                HGB                      12.1 (L)            10/13/2024                HCT                      34.5 (L)  10/13/2024                MCV                      89.4                10/13/2024                PLT                      62 (L)              10/13/2024             Lab Results      Component                Value               Date                      NA                       128 (L)             10/13/2024                K                        3.8                 10/13/2024                CO2                      24                  10/13/2024                GLUCOSE                  192 (H)             10/13/2024                BUN                      27 (H)              10/13/2024                CREATININE               1.67 (H)            10/13/2024                CALCIUM                   8.8 (L)             10/13/2024                EGFR                     52 (L)              09/02/2023                GFRNONAA  46 (L)              10/13/2024              Anesthesia Other Findings   Reproductive/Obstetrics                              Anesthesia Physical Anesthesia Plan  ASA: 4  Anesthesia Plan: General   Post-op Pain Management:    Induction: Intravenous  PONV Risk Score and Plan: 2 and Midazolam  and Treatment may vary due to age or medical condition  Airway Management Planned: Mask and Oral ETT  Additional Equipment: Arterial line, CVP, PA Cath, TEE, 3D TEE and Ultrasound Guidance Line Placement  Intra-op Plan:   Post-operative Plan:  Post-operative intubation/ventilation  Informed Consent: I have reviewed the patients History and Physical, chart, labs and discussed the procedure including the risks, benefits and alternatives for the proposed anesthesia with the patient or authorized representative who has indicated his/her understanding and acceptance.     Dental advisory given  Plan Discussed with: CRNA  Anesthesia Plan Comments:          Anesthesia Quick Evaluation  "

## 2024-10-13 NOTE — Plan of Care (Signed)
" °  Problem: Education: Goal: Understanding of CV disease, CV risk reduction, and recovery process will improve Outcome: Progressing   Problem: Activity: Goal: Ability to return to baseline activity level will improve Outcome: Progressing   Problem: Cardiovascular: Goal: Ability to achieve and maintain adequate cardiovascular perfusion will improve Outcome: Progressing Goal: Vascular access site(s) Level 0-1 will be maintained Outcome: Progressing   Problem: Health Behavior/Discharge Planning: Goal: Ability to safely manage health-related needs after discharge will improve Outcome: Progressing   Problem: Education: Goal: Knowledge of General Education information will improve Description: Including pain rating scale, medication(s)/side effects and non-pharmacologic comfort measures Outcome: Progressing   Problem: Health Behavior/Discharge Planning: Goal: Ability to manage health-related needs will improve Outcome: Progressing   Problem: Clinical Measurements: Goal: Ability to maintain clinical measurements within normal limits will improve Outcome: Progressing Goal: Will remain free from infection Outcome: Progressing Goal: Diagnostic test results will improve Outcome: Progressing Goal: Respiratory complications will improve Outcome: Progressing Goal: Cardiovascular complication will be avoided Outcome: Progressing   Problem: Activity: Goal: Risk for activity intolerance will decrease Outcome: Progressing   Problem: Nutrition: Goal: Adequate nutrition will be maintained Outcome: Progressing   Problem: Coping: Goal: Level of anxiety will decrease Outcome: Progressing   Problem: Elimination: Goal: Will not experience complications related to bowel motility Outcome: Progressing Goal: Will not experience complications related to urinary retention Outcome: Progressing   Problem: Pain Managment: Goal: General experience of comfort will improve and/or be  controlled Outcome: Progressing   Problem: Safety: Goal: Ability to remain free from injury will improve Outcome: Progressing   Problem: Skin Integrity: Goal: Risk for impaired skin integrity will decrease Outcome: Progressing   Problem: Education: Goal: Ability to describe self-care measures that may prevent or decrease complications (Diabetes Survival Skills Education) will improve Outcome: Progressing   Problem: Coping: Goal: Ability to adjust to condition or change in health will improve Outcome: Progressing   Problem: Fluid Volume: Goal: Ability to maintain a balanced intake and output will improve Outcome: Progressing   Problem: Health Behavior/Discharge Planning: Goal: Ability to identify and utilize available resources and services will improve Outcome: Progressing Goal: Ability to manage health-related needs will improve Outcome: Progressing   Problem: Metabolic: Goal: Ability to maintain appropriate glucose levels will improve Outcome: Progressing   Problem: Nutritional: Goal: Maintenance of adequate nutrition will improve Outcome: Progressing Goal: Progress toward achieving an optimal weight will improve Outcome: Progressing   Problem: Skin Integrity: Goal: Risk for impaired skin integrity will decrease Outcome: Progressing   Problem: Tissue Perfusion: Goal: Adequacy of tissue perfusion will improve Outcome: Progressing   Problem: Cardiac: Goal: Ability to achieve and maintain adequate cardiopulmonary perfusion will improve Outcome: Progressing Goal: Vascular access site(s) Level 0-1 will be maintained Outcome: Progressing   Problem: Fluid Volume: Goal: Ability to achieve a balanced intake and output will improve Outcome: Progressing   Problem: Physical Regulation: Goal: Complications related to the disease process, condition or treatment will be avoided or minimized Outcome: Progressing   Problem: Respiratory: Goal: Will regain and/or maintain  adequate ventilation Outcome: Progressing   "

## 2024-10-13 NOTE — Progress Notes (Signed)
 ANTICOAGULATION CONSULT NOTE  Pharmacy Consult for heparin  >> bivalirudin  Indication: atrial fibrillation (Eliquis  PTA)  Allergies[1]  Patient Measurements: Height: 6' 6 (198.1 cm) Weight: 96.5 kg (212 lb 11.9 oz) IBW/kg (Calculated) : 91.4 Heparin  Dosing Weight: 104 kg   Vital Signs: Temp: 98.4 F (36.9 C) (12/30 1700) Pulse Rate: 82 (12/30 1700)  Labs: Recent Labs     0000 10/10/24 2215 10/11/24 0227 10/11/24 0800 10/11/24 1815 10/12/24 0533 10/12/24 1727 10/13/24 0515 10/13/24 1643  HGB   < >  --  13.7  --   --  13.0  --  12.1*  --   HCT  --   --  38.4*  --   --  37.3*  --  34.5*  --   PLT  --   --  61*  --   --  62*  --  62*  --   APTT  --   --   --   --    < > 70* 69* 57* 50*  HEPARINUNFRC  --  0.65  --  0.27*  --   --   --   --   --   CREATININE  --   --  1.78*  --   --  1.65*  --  1.67*  --    < > = values in this interval not displayed.    Estimated Creatinine Clearance: 58.5 mL/min (A) (by C-G formula based on SCr of 1.67 mg/dL (H)).   Assessment: 83 yoM presents for RHC before planned LVAD implant.  Pharmacy consulted for heparin  dosing while apixaban  on hold (last dose given 12/18 AM). Pt s/p Impella 5.5 12/23, heparin  resumed postop.  Per AHF MD, with continued low platelet count, would like to sent a HIT antibody lab and switch to bivalirudin  on 12/28. aPTT is therapeutic at 57, on 0.02 mg/kg/hr. Hgb stable at 12.1, plt stabilized but still low at 62. No s/sx of bleeding or infusion issues.   PM Update: aPTT 50 - within goal.   Goal of Therapy:  aPTT 50-80s Monitor platelets by anticoagulation protocol: Yes   Plan:  Continue bivalirudin  at 0.02 mg/kg/hr (DW 96.7kg) Stop bivalirudin  at 0500 AM 12/31 for Surgery.  Monitor aPTT, CBC, and signs/symptoms of bleeding F/u HIT antibody and SRA (still in process), plan for LVAD implantation this week potentially pending labs   Thank you for allowing pharmacy to participate in this patient's  care,  Harlene Boga, PharmD 10/13/2024 6:07 PM  Please check AMION for all [facility] Pharmacy phone numbers After 10:00 PM, call Main Pharmacy [phone]      [1] No Known Allergies

## 2024-10-13 NOTE — Progress Notes (Signed)
 Initial Nutrition Assessment  DOCUMENTATION CODES:   Not applicable  INTERVENTION:   Post-Op, pt may benefit from liberalized diet, small frequent meals (scheduled snacks) and increasing Ensure frequency  Consider liberalizing diet to Regular with continued Fluid Restriction  Continue Ensure Plus High Protein po BID, each supplement provides 350 kcal and 20 grams of protein  Pt has been on calcium  carbonate BID with meals; recommend checking phosphorus as add-on today with supplementation if low prior to surgery tomorrow. No phosphorus checked this admission   NUTRITION DIAGNOSIS:   Increased nutrient needs related to post-op healing, catabolic illness as evidenced by estimated needs.  GOAL:   Patient will meet greater than or equal to 90% of their needs  MONITOR:   PO intake, Supplement acceptance, Labs, Weight trends, I & O's  REASON FOR ASSESSMENT:   Consult Assessment of nutrition requirement/status  ASSESSMENT:   63 yo male admitted for pre-op optimization for LVAD implant. Pt with acute on chronic systolic CHF (NICM)-last Echo 0/74 with EF 10-15%, severe LV dilation, mild-mod RV dilation, moderate MR, moderate TR, ICD implantation. Additional PMH includes DM, COPD, HTN, tobacco abuse (quit 2022), inguinal hernia repair  12/18 Admitted 12/23 Impella 5.5  Impella 5.5 at P7 Noted plan for LVAD on Wednesday  (Tomorrow). Initial plan was for LVAD on 12/23. ICD turned off today Currently on mirlinone, amiodarone  and bival gtt  AKI on CKD 3, Creatinine improving  Appetite is fair. Recorded po intake has been variable ranging from 10-100% of meals.  Ordered Ensure Plus High Protein yesterday; per RN, pt drank this AM  This RD assessed pt on admission in October of this year. At baseline, pt is a creature of habit when it comes to his diet. Pt typically eats 2 meals per day which are usually the same thing each day. Pt does not like the hospital food.    Eats cereal  in the AM and usually has a [employer] Turkey sub around 8pm. Pt reports he does not really snack. Pt takes a one a day MVI at home.   Pt's best meal this admission has been breakfast. Yesterday pt ate good breakfast but then only bites at lunch and 25% at dinner.   Current Wt: 96.5 kg (213 pounds), up from 95.7 kg yesterday (211 pounds) Admit Wt: 104.3 kg (230 pounds) with highest wt of 106 kg (234 pounds on 12/21)  In October, pt indicating UBW around 255-260 pounds but +significant edema with wt down prior to discharge with significant diuresis  Labs: Creatinine 1.67 BUN 27 Sodium 128 (L) Potassium 3.8 (wdl) CBGs 70-192   Meds: Calcium  Carbonate BID with meals KCl Mag Sulfate Miralax   Colace Sorbitol  prn SS novolog   NUTRITION - FOCUSED PHYSICAL EXAM:  Assess on follow-up  Diet Order:   Diet Order             Diet 2 gram sodium Room service appropriate? Yes; Fluid consistency: Thin; Fluid restriction: 1500 mL Fluid  Diet effective now                   EDUCATION NEEDS:   Education needs have been addressed  Skin:  Skin Assessment: Skin Integrity Issues: Skin Integrity Issues:: Incisions Incisions: R Chest (closed)  Last BM:  12/30 large type 5 (previously constipated)  Height:   Ht Readings from Last 1 Encounters:  10/01/24 6' 6 (1.981 m)    Weight:   Wt Readings from Last 1 Encounters:  10/13/24 96.5 kg   BMI:  Body mass index is 24.59 kg/m.  Estimated Nutritional Needs:   Kcal:  2400-2600 kcals  Protein:  140-175 g  Fluid:  1.8 L   Betsey Finger MS, RDN, LDN, CNSC Registered Dietitian 3 Clinical Nutrition RD Inpatient Contact Info in Amion

## 2024-10-13 NOTE — H&P (View-Only) (Signed)
 6 Days Post-Op Procedures (LRB): RIGHT HEART CATH (N/A) Subjective:  He feels well, ambulated today.  ICD just turned off by [employer] rep.   Objective: Vital signs in last 24 hours: Temp:  [97.7 F (36.5 C)-98.4 F (36.9 C)] 98.2 F (36.8 C) (12/30 1100) Pulse Rate:  [56-125] 87 (12/30 1100) Cardiac Rhythm: Normal sinus rhythm;Heart block (12/30 0900) Resp:  [10-32] 16 (12/30 1100) BP: (108-117)/(79-100) 114/88 (12/29 1900) SpO2:  [75 %-100 %] 96 % (12/30 1100) Arterial Line BP: (95-147)/(54-93) 119/71 (12/30 1100) Weight:  [96.5 kg] 96.5 kg (12/30 0545)  Hemodynamic parameters for last 24 hours: PAP: (49-96)/(15-45) 63/22 CVP:  [1 mmHg-18 mmHg] 12 mmHg CO:  [4.5 L/min-5.1 L/min] 4.5 L/min CI:  [1.92 L/min/m2-2.19 L/min/m2] 1.92 L/min/m2  Intake/Output from previous day: 12/29 0701 - 12/30 0700 In: 1422.5 [P.O.:240; I.V.:917.8] Out: 1500 [Urine:1500] Intake/Output this shift: Total I/O In: 481.2 [P.O.:240; I.V.:147.5; Other:43.7; IV Piggyback:50] Out: 380 [Urine:380]  General appearance: alert and cooperative Neurologic: intact Heart: regular rate and rhythm Lungs: clear to auscultation bilaterally Extremities: no edema Impella site ok  Lab Results: Recent Labs    10/12/24 0533 10/13/24 0515  WBC 8.2 8.0  HGB 13.0 12.1*  HCT 37.3* 34.5*  PLT 62* 62*   BMET:  Recent Labs    10/12/24 0533 10/13/24 0515  NA 132* 128*  K 4.1 3.8  CL 97* 95*  CO2 25 24  GLUCOSE 127* 192*  BUN 28* 27*  CREATININE 1.65* 1.67*  CALCIUM  9.0 8.8*    PT/INR: No results for input(s): LABPROT, INR in the last 72 hours. ABG    Component Value Date/Time   PHART 7.464 (H) 10/10/2024 0502   HCO3 25.6 10/10/2024 0502   TCO2 27 10/10/2024 0502   ACIDBASEDEF 1.0 12/21/2022 1037   ACIDBASEDEF 1.0 12/21/2022 1037   O2SAT 73.8 10/13/2024 0515   CBG (last 3)  Recent Labs    10/12/24 2106 10/13/24 0641 10/13/24 1104  GLUCAP 116* 192* 148*     Assessment/Plan:  Hemodynamics stable on milrinone  0.5 and Impella. Co-ox 73.8.   Renal function stable with creat 1.67. I/O about even yesterday.  I discussed the operative procedure of HeartMate 3 insertion and removal of the Impella 5.5 with the patient and family including alternatives, benefits and risks; including but not limited to bleeding, blood transfusion, infection, stroke, myocardial infarction, pump failure, RV failure requiring temporary RV support device,  organ dysfunction, and death.  James [name] understands and agrees to proceed.  We will schedule surgery for  tomorrow morning.    LOS: 12 days    James [name] 10/13/2024

## 2024-10-13 NOTE — Progress Notes (Signed)
 Pharmacy Heparin  Induced Thrombocytopenia (HIT) Note:  James [name] is an 63 y.o. male being evaluated for HIT. Heparin  was started 12/19 for atrial fibrillation, and baseline platelets were 196.   HIT labs were ordered on 12/28 when platelets dropped to 61.  Auto-populate labs:  Heparin  Induced Plt Ab  Date/Time Value Ref Range Status  10/11/2024 11:28 AM 0.515 (H) 0.000 - 0.400 OD Final    Comment:    (NOTE) Performed At: [facility] [address] [place], [place] [id number] Jennette Shorter MD Ey:[id number]      CALCULATE SCORE:  4Ts (see the HIT Algorithm) Score  Thrombocytopenia 1  Timing 2  Thrombosis 0  Other causes of thrombocytopenia 0  Total 3   Table 2. Diagnostic Algorithm for HIT Using Optical Density Values  4Ts  OD  HIT probability  SRA recommendations  Anticoagulant  recommendations  Heparin   Allergy Documentation   0-3  >= 2.0  Possible HIT  Order SRA    AAC  Heparin  antibody positive;  SRA pending     1.5-1.99         0.6-1.49  Possible HIT  Order SRA  Discontinue heparin  and consider AAC  Heparin  antibody positive;  SRA pending     < 0.6  HIT ruled out  None  Continue UFH/LMWH  None   4-5  >= 2.0  Possible HIT  Order SRA    AAC  Heparin  antibody positive;  SRA pending       1.5-1.99         0.6-1.49         < 0.6  HIT ruled out  None (HIT ruled out)  Continue UFH/LMWH  None   6-8  >= 2.0  Possible HIT  Order SRA  (Possible HIT)  AAC  Heparin  antibody positive;  SRA pending     1.5-1.99         0.6-1.49         < 0.6  HIT ruled out  None (HIT ruled out)  Continue UFH/LMWH  None    Recommendations (A or B) are based on available lab results (HIT antibody and/or SRA) and the HIT algorithm    A. HIT antibody result available  HIT ruled out  <0.6 per protocol as shown in table above No SRA needed Continue heparin  / LMWH No allergy documentation needed   B. SRA result availability  SRA not available   Name of MD  Contacted: Dr. Lucas  Plan (Discussed with provider) Labs ordered:  SRA ordered  Heparin  allergy:  No allergy documentation needed. Anticoagulation plans:  Ok to Continue heparin  / LMWH    Comments (List any alternative plans or if there are contraindications to therapy)  Carlee Tesfaye, PharmD Please refer to AMION for [facility] Pharmacy numbers 10/13/2024, 5:22 PM

## 2024-10-13 NOTE — Progress Notes (Signed)
 [employer] SQ device therapies turned off for procedure tomorrow.   Chevi Lim, NP Advanced Heart Failure

## 2024-10-13 NOTE — Progress Notes (Signed)
 6 Days Post-Op Procedures (LRB): RIGHT HEART CATH (N/A) Subjective:  He feels well, ambulated today.  ICD just turned off by [employer] rep.   Objective: Vital signs in last 24 hours: Temp:  [97.7 F (36.5 C)-98.4 F (36.9 C)] 98.2 F (36.8 C) (12/30 1100) Pulse Rate:  [56-125] 87 (12/30 1100) Cardiac Rhythm: Normal sinus rhythm;Heart block (12/30 0900) Resp:  [10-32] 16 (12/30 1100) BP: (108-117)/(79-100) 114/88 (12/29 1900) SpO2:  [75 %-100 %] 96 % (12/30 1100) Arterial Line BP: (95-147)/(54-93) 119/71 (12/30 1100) Weight:  [96.5 kg] 96.5 kg (12/30 0545)  Hemodynamic parameters for last 24 hours: PAP: (49-96)/(15-45) 63/22 CVP:  [1 mmHg-18 mmHg] 12 mmHg CO:  [4.5 L/min-5.1 L/min] 4.5 L/min CI:  [1.92 L/min/m2-2.19 L/min/m2] 1.92 L/min/m2  Intake/Output from previous day: 12/29 0701 - 12/30 0700 In: 1422.5 [P.O.:240; I.V.:917.8] Out: 1500 [Urine:1500] Intake/Output this shift: Total I/O In: 481.2 [P.O.:240; I.V.:147.5; Other:43.7; IV Piggyback:50] Out: 380 [Urine:380]  General appearance: alert and cooperative Neurologic: intact Heart: regular rate and rhythm Lungs: clear to auscultation bilaterally Extremities: no edema Impella site ok  Lab Results: Recent Labs    10/12/24 0533 10/13/24 0515  WBC 8.2 8.0  HGB 13.0 12.1*  HCT 37.3* 34.5*  PLT 62* 62*   BMET:  Recent Labs    10/12/24 0533 10/13/24 0515  NA 132* 128*  K 4.1 3.8  CL 97* 95*  CO2 25 24  GLUCOSE 127* 192*  BUN 28* 27*  CREATININE 1.65* 1.67*  CALCIUM  9.0 8.8*    PT/INR: No results for input(s): LABPROT, INR in the last 72 hours. ABG    Component Value Date/Time   PHART 7.464 (H) 10/10/2024 0502   HCO3 25.6 10/10/2024 0502   TCO2 27 10/10/2024 0502   ACIDBASEDEF 1.0 12/21/2022 1037   ACIDBASEDEF 1.0 12/21/2022 1037   O2SAT 73.8 10/13/2024 0515   CBG (last 3)  Recent Labs    10/12/24 2106 10/13/24 0641 10/13/24 1104  GLUCAP 116* 192* 148*     Assessment/Plan:  Hemodynamics stable on milrinone  0.5 and Impella. Co-ox 73.8.   Renal function stable with creat 1.67. I/O about even yesterday.  I discussed the operative procedure of HeartMate 3 insertion and removal of the Impella 5.5 with the patient and family including alternatives, benefits and risks; including but not limited to bleeding, blood transfusion, infection, stroke, myocardial infarction, pump failure, RV failure requiring temporary RV support device,  organ dysfunction, and death.  Ozell Jama Daring understands and agrees to proceed.  We will schedule surgery for  tomorrow morning.    LOS: 12 days    James [name] 10/13/2024

## 2024-10-13 NOTE — Progress Notes (Addendum)
 "    Advanced Heart Failure Rounding Note  Cardiologist: Maude Emmer, MD  [facility] Cardiologist: Dr. Zenaida Chief Complaint: Chronic systolic heart failure Patient Profile   James [name] is a 63 y.o. male with history of stage D cardiomyopathy/chronic systolic heart failure now end-stage w/ inotrope dependence, CKD IIIa, hx VT, PAF, prostate cancer, meningioma.  Admitted for LVAD implant/ pre-VAD HF optimization.   Significant events:   12/18: Milrinone  titrated from 0.25 to 0.5 mcg/kg/min post RHC d/t low-output and pulmonary hypertension 12/19: NE added 12/22: Markedly elevated PA pressures, elevated PCWP and elevated SVR. Titrated off NE. Added DBA.  12/23: Impella 5.5 Implanted. DBA stopped   12/25: Low filling pressures off swan. Diuretics held. Albumin  given  12/28: Concern for HIT. Heparin  > Bival. SRA/PF4 sent.   Subjective:    On milrinone  0.5 mcg/kg/min. sCr stable 1.6. LDH stable 830s. Plt stable 62k. Ambulating gin halls, feeling well.   Swan: PAP: (50-96)/(17-47) 57/17 CVP:  [0 mmHg-18 mmHg] 2 mmHg CO:  [4.5 L/min-5.4 L/min] 5.1 L/min CI:  [1.92 L/min/m2-2.28 L/min/m2] 2.19 L/min/m2 Co-ox 74%  Flow (Liters/min): 4.4 Liters/min Performance Level: P7 Recent Labs    10/11/24 0227 10/12/24 0533 10/13/24 0515  LDH 821* 839* 839*   Objective:    Weight Range: 96.5 kg Body mass index is 24.59 kg/m.   Vital Signs:   Temp:  [97.7 F (36.5 C)-98.8 F (37.1 C)] 98.1 F (36.7 C) (12/30 0600) Pulse Rate:  [56-145] 59 (12/30 0600) Resp:  [10-32] 12 (12/30 0600) BP: (108-121)/(71-100) 114/88 (12/29 1900) SpO2:  [75 %-100 %] 96 % (12/30 0600) Arterial Line BP: (95-159)/(54-104) 95/54 (12/30 0600) Weight:  [96.5 kg] 96.5 kg (12/30 0545) Last BM Date : 10/12/24  Weight change: Filed Weights   10/11/24 0704 10/12/24 0703 10/13/24 0545  Weight: 96.7 kg 95.7 kg 96.5 kg   Intake/Output:  Intake/Output Summary (Last 24 hours) at 10/13/2024 0709 Last data  filed at 10/13/2024 0600 Gross per 24 hour  Intake 1373.81 ml  Output 1500 ml  Net -126.19 ml    Physical Exam   General: Well appearing. No distress  Cardiac: JVP flat. No murmurs  Extremities: Warm and dry.  No peripheral edema.  Neuro: A&O x3. Affect pleasant.   Telemetry   SR 60s, 8-12 PVCs/min (personally reviewed)  Labs   CBC Recent Labs    10/12/24 0533 10/13/24 0515  WBC 8.2 8.0  HGB 13.0 12.1*  HCT 37.3* 34.5*  MCV 90.1 89.4  PLT 62* 62*   Basic Metabolic Panel Recent Labs    87/70/74 0533 10/13/24 0515  NA 132* 128*  K 4.1 3.8  CL 97* 95*  CO2 25 24  GLUCOSE 127* 192*  BUN 28* 27*  CREATININE 1.65* 1.67*  CALCIUM  9.0 8.8*  MG 2.0 1.7   Liver Function Tests Recent Labs    10/10/24 1237  AST 42*  ALT 20  ALKPHOS 119  BILITOT 1.5*  PROT 6.5  ALBUMIN  3.7   BNP (last 3 results) Recent Labs    06/10/24 1213 07/09/24 1649 07/23/24 0959  BNP 1,071.5* 3,316.0* 595.8*   ProBNP (last 3 results) Recent Labs    10/01/24 1030  PROBNP 8,374.0*   Medications:    Scheduled Medications:  atorvastatin   40 mg Oral Daily   calcium  carbonate  1 tablet Oral BID WC   Chlorhexidine  Gluconate Cloth  6 each Topical Daily   feeding supplement  237 mL Oral BID BM   insulin  aspart  0-15 Units  Subcutaneous TID WC   melatonin  3 mg Oral Q24H   mexiletine  200 mg Oral Q8H   polyethylene glycol  17 g Oral BID   sodium chloride  flush  10-40 mL Intracatheter Q12H   sodium chloride  flush  3 mL Intravenous Q12H   spironolactone   25 mg Oral QHS    Infusions:  amiodarone  30 mg/hr (10/13/24 0600)   bivalirudin  (ANGIOMAX ) 250 mg in sodium chloride  0.9 % 500 mL (0.5 mg/mL) infusion 0.02 mg/kg/hr (10/13/24 0600)   milrinone  0.5 mcg/kg/min (10/13/24 0600)   sodium bicarbonate  25 mEq (Impella PURGE) in dextrose  5 % 1000 mL bag      PRN Medications: sodium chloride , acetaminophen , acetaminophen , fentaNYL  (SUBLIMAZE ) injection, ondansetron  (ZOFRAN ) IV,  ondansetron , mouth rinse, sodium chloride  flush, sodium chloride  flush, sorbitol   Assessment/Plan   1. Acute on chronic systolic CHF, NYHA IV: Nonischemic cardiomyopathy.  Last echo in 9/25 with EF 10-15%, severe LV dilation, mild RVE with mild-moderate RV dilation, moderate MR, moderate TR, dilated IVC. H/o low output HF, has been on home milrinone  0.25.  Extensive discussion recently re: transplant vs LVAD.  Finally, it appears that patient has settled on LVAD as his preference.  He was brought in for hemodynamic optimization prior to LVAD.  RHC 12/18 showed elevated filling pressures with mod-severe mixed pulmonary arterial/pulmonary venous hypertension and low CO despite milrinone  0.25, increased to 0.5. Impella 5.5 placed 12/23.   - Currently on Impella P7, flow 4.4 LPM. Stable. Continue - Co-ox 74%, CI 2.1 by thermo, CVP. Creatinine stable at 1.6  - Continue milrinone  0.5; plan to wean to 0.375 after night shift - Off sildenafil  and digoxin . Continue spiro 25 mg daily.  - Plan for LVAD on Wednesday if creatinine remains stable.  - Continue to ambulate   - Will get device tachy therapies turned off today  2. Pulmonary hypertension: Moderate-severe mixed pulmonary arterial/pulmonary venous hypertension on RHC 12/18. Much improved with Impella 5.5, PASP in 40s now.  - Continue milrinone  0.5 - Off sildenafil   3.  AKI on CKD stage 3: Scr up to 2.3, trending down 1.65 today.  Likely cardiorenal syndrome. - continue Impella + milrinone  support  4.  PVCs/NSVT: Exacerbated by dual inotrope support. Has [employer] ICD.   - Continue IV amio and mexiletine 200 mg tid - Supp K and Mag - Plan to turn off tachy therapies tonight for VAD implant.   5.  Paroxysmal Atrial fibrillation: Remains in NSR.  - Continue amiodarone  as above.  - Eliquis  converted to heparin  gtt in anticipation of surgery  6. Meningioma: Monitoring.   7. Prostate cancer: Treated with radiation.   8.  Hypokalemia/hypomagnesemia - supp as needed  9. Constipation: advance bowel regimen. Having BMs  10. Hyponatremia: Na 132, has received tolvaptan .  - Fluid restrict.   11. Thrombocytopenia: Slow fall in plts, down to 61 K today.  Impella stable with rare ectopy and no suction, LDH coming down.  With left arm swelling, concern for DVT.  - Check HIT with the above constellation. SAR pending.  - continue bival for now.  12. LUE swelling: DVT study for LUE ordered.  Check for HIT.   CRITICAL CARE Performed by: Aleesha Ringstad  Total critical care time: 7 minutes  -Critical care time was exclusive of separately billable procedures and treating other patients. -Critical care was necessary to treat or prevent imminent or life-threatening deterioration. -Critical care was time spent personally by me on the following activities: development of treatment plan with patient  and/or surrogate as well as nursing, discussions with consultants, evaluation of patient's response to treatment, examination of patient, obtaining history from patient or surrogate, ordering and performing treatments and interventions, ordering and review of laboratory studies, ordering and review of radiographic studies, pulse oximetry and re-evaluation of patient's condition.  Length of Stay: 87  James Sacks, NP  10/13/2024, 7:09 AM  Advanced Heart Failure Team Pager [phone] (M-F; 7a - 5p)   Please visit [email or web address]: For overnight coverage please call cardiology fellow first. If fellow not available call Shock/ECMO MD on call.  For ECMO / Mechanical Support (Impella, IABP, LVAD) issues call Shock / ECMO MD on call.    "

## 2024-10-13 NOTE — Progress Notes (Signed)
 Met with pt and wife at bedside. Procedure consent form signed with patient and Dr Lucas. Criteria note placed by Lauraine Ip RN 10/07/24- no changes.   Discussed plan for tomorrow with pt's wife. Plan for VAD coordinator to call [name]'s cell phone to provide updates.   All questions answered at this time.   Isaiah Knoll RN VAD Coordinator  Office: [phone]  24/7 Pager: [phone]

## 2024-10-13 NOTE — Progress Notes (Signed)
 ANTICOAGULATION CONSULT NOTE  Pharmacy Consult for heparin  >> bivalirudin  Indication: atrial fibrillation (Eliquis  PTA)  Allergies[1]  Patient Measurements: Height: 6' 6 (198.1 cm) Weight: 96.5 kg (212 lb 11.9 oz) IBW/kg (Calculated) : 91.4 Heparin  Dosing Weight: 104 kg   Vital Signs: Temp: 98.1 F (36.7 C) (12/30 0600) Temp Source: Core (12/30 0000) Pulse Rate: 59 (12/30 0600)  Labs: Recent Labs    10/10/24 1008 10/10/24 1237 10/10/24 2215 10/11/24 0227 10/11/24 0800 10/11/24 1815 10/12/24 0533 10/12/24 1727 10/13/24 0515  HGB  --    < >  --  13.7  --   --  13.0  --  12.1*  HCT  --   --   --  38.4*  --   --  37.3*  --  34.5*  PLT  --   --   --  61*  --   --  62*  --  62*  APTT  --   --   --   --   --    < > 70* 69* 57*  HEPARINUNFRC 0.13*  --  0.65  --  0.27*  --   --   --   --   CREATININE  --    < >  --  1.78*  --   --  1.65*  --  1.67*   < > = values in this interval not displayed.    Estimated Creatinine Clearance: 58.5 mL/min (A) (by C-G formula based on SCr of 1.67 mg/dL (H)).   Assessment: 32 yoM presents for RHC before planned LVAD implant.  Pharmacy consulted for heparin  dosing while apixaban  on hold (last dose given 12/18 AM). Pt s/p Impella 5.5 12/23, heparin  resumed postop.  Per AHF MD, with continued low platelet count, would like to sent a HIT antibody lab and switch to bivalirudin  on 12/28. aPTT is therapeutic at 57, on 0.02 mg/kg/hr. Hgb stable at 12.1, plt stabilized but still low at 62. No s/sx of bleeding or infusion issues.   Goal of Therapy:  aPTT 50-80s Monitor platelets by anticoagulation protocol: Yes   Plan:  Continue bivalirudin  at 0.02 mg/kg/hr (DW 96.7kg) Monitor aPTT, CBC, and signs/symptoms of bleeding F/u HIT antibody and SRA (still in process), plan for LVAD implantation this week potentially pending labs   Thank you for allowing pharmacy to participate in this patient's care,  Suzen Sour, PharmD, BCCCP Clinical  Pharmacist  Phone: [phone] 10/13/2024 7:25 AM  Please check AMION for all [facility] Pharmacy phone numbers After 10:00 PM, call Main Pharmacy [phone]      [1] No Known Allergies

## 2024-10-14 ENCOUNTER — Inpatient Hospital Stay (HOSPITAL_COMMUNITY)

## 2024-10-14 ENCOUNTER — Encounter (HOSPITAL_COMMUNITY): Payer: Self-pay | Admitting: Cardiology

## 2024-10-14 ENCOUNTER — Inpatient Hospital Stay (HOSPITAL_COMMUNITY): Admitting: Anesthesiology

## 2024-10-14 ENCOUNTER — Inpatient Hospital Stay (HOSPITAL_COMMUNITY)
Admission: AD | Disposition: A | Discharge status: Rehabilitation Facility | Payer: Self-pay | Source: Home / Self Care | Attending: Cardiology

## 2024-10-14 DIAGNOSIS — D696 Thrombocytopenia, unspecified: Secondary | ICD-10-CM

## 2024-10-14 DIAGNOSIS — I5020 Unspecified systolic (congestive) heart failure: Secondary | ICD-10-CM | POA: Diagnosis not present

## 2024-10-14 DIAGNOSIS — I428 Other cardiomyopathies: Secondary | ICD-10-CM | POA: Diagnosis not present

## 2024-10-14 DIAGNOSIS — R570 Cardiogenic shock: Secondary | ICD-10-CM | POA: Diagnosis not present

## 2024-10-14 DIAGNOSIS — J811 Chronic pulmonary edema: Secondary | ICD-10-CM | POA: Diagnosis not present

## 2024-10-14 DIAGNOSIS — Z978 Presence of other specified devices: Secondary | ICD-10-CM

## 2024-10-14 DIAGNOSIS — Z95811 Presence of heart assist device: Secondary | ICD-10-CM | POA: Diagnosis not present

## 2024-10-14 DIAGNOSIS — I509 Heart failure, unspecified: Secondary | ICD-10-CM

## 2024-10-14 DIAGNOSIS — I5023 Acute on chronic systolic (congestive) heart failure: Secondary | ICD-10-CM | POA: Diagnosis not present

## 2024-10-14 DIAGNOSIS — Z9911 Dependence on respirator [ventilator] status: Secondary | ICD-10-CM

## 2024-10-14 DIAGNOSIS — J9811 Atelectasis: Secondary | ICD-10-CM | POA: Diagnosis not present

## 2024-10-14 HISTORY — PX: INSERTION OF IMPLANTABLE LEFT VENTRICULAR ASSIST DEVICE: SHX5866

## 2024-10-14 HISTORY — PX: INTRAOPERATIVE TRANSESOPHAGEAL ECHOCARDIOGRAM: SHX5062

## 2024-10-14 LAB — COOXEMETRY PANEL
Carboxyhemoglobin: 2.7 % — ABNORMAL HIGH (ref 0.5–1.5)
Carboxyhemoglobin: 2.2 % — ABNORMAL HIGH (ref 0.5–1.5)
Methemoglobin: <0.7 % (ref 0.0–1.5)
Methemoglobin: 1.1 % (ref 0.0–1.5)
O2 Saturation: 65.2 %
O2 Saturation: 74.7 %
Total hemoglobin: 11.5 g/dL — ABNORMAL LOW (ref 12.0–16.0)
Total hemoglobin: 8.8 g/dL — ABNORMAL LOW (ref 12.0–16.0)

## 2024-10-14 LAB — POCT I-STAT 7, (LYTES, BLD GAS, ICA,H+H)
Acid-Base Excess: 0 mmol/L (ref 0.0–2.0)
Acid-base deficit: 3 mmol/L — ABNORMAL HIGH (ref 0.0–2.0)
Acid-base deficit: 1 mmol/L (ref 0.0–2.0)
Acid-base deficit: 2 mmol/L (ref 0.0–2.0)
Bicarbonate: 23.5 mmol/L (ref 20.0–28.0)
Bicarbonate: 21.7 mmol/L (ref 20.0–28.0)
Bicarbonate: 24.9 mmol/L (ref 20.0–28.0)
Bicarbonate: 24.4 mmol/L (ref 20.0–28.0)
Calcium, Ion: 1.2 mmol/L (ref 1.15–1.40)
Calcium, Ion: 1.13 mmol/L — ABNORMAL LOW (ref 1.15–1.40)
Calcium, Ion: 1.27 mmol/L (ref 1.15–1.40)
Calcium, Ion: 1.38 mmol/L (ref 1.15–1.40)
HCT: 34 % — ABNORMAL LOW (ref 39.0–52.0)
HCT: 29 % — ABNORMAL LOW (ref 39.0–52.0)
HCT: 27 % — ABNORMAL LOW (ref 39.0–52.0)
HCT: 28 % — ABNORMAL LOW (ref 39.0–52.0)
Hemoglobin: 11.6 g/dL — ABNORMAL LOW (ref 13.0–17.0)
Hemoglobin: 9.9 g/dL — ABNORMAL LOW (ref 13.0–17.0)
Hemoglobin: 9.2 g/dL — ABNORMAL LOW (ref 13.0–17.0)
Hemoglobin: 9.5 g/dL — ABNORMAL LOW (ref 13.0–17.0)
O2 Saturation: 97 %
O2 Saturation: 100 %
O2 Saturation: 100 %
O2 Saturation: 100 %
Patient temperature: 36.6
Patient temperature: 36.7
Potassium: 4.2 mmol/L (ref 3.5–5.1)
Potassium: 3.8 mmol/L (ref 3.5–5.1)
Potassium: 3.3 mmol/L — ABNORMAL LOW (ref 3.5–5.1)
Potassium: 3.6 mmol/L (ref 3.5–5.1)
Sodium: 130 mmol/L — ABNORMAL LOW (ref 135–145)
Sodium: 130 mmol/L — ABNORMAL LOW (ref 135–145)
Sodium: 132 mmol/L — ABNORMAL LOW (ref 135–145)
Sodium: 134 mmol/L — ABNORMAL LOW (ref 135–145)
TCO2: 25 mmol/L (ref 22–32)
TCO2: 23 mmol/L (ref 22–32)
TCO2: 26 mmol/L (ref 22–32)
TCO2: 26 mmol/L (ref 22–32)
pCO2 arterial: 32.6 mmHg (ref 32–48)
pCO2 arterial: 35.7 mmHg (ref 32–48)
pCO2 arterial: 47 mmHg (ref 32–48)
pCO2 arterial: 46.7 mmHg (ref 32–48)
pH, Arterial: 7.465 — ABNORMAL HIGH (ref 7.35–7.45)
pH, Arterial: 7.392 (ref 7.35–7.45)
pH, Arterial: 7.331 — ABNORMAL LOW (ref 7.35–7.45)
pH, Arterial: 7.325 — ABNORMAL LOW (ref 7.35–7.45)
pO2, Arterial: 85 mmHg (ref 83–108)
pO2, Arterial: 403 mmHg — ABNORMAL HIGH (ref 83–108)
pO2, Arterial: 454 mmHg — ABNORMAL HIGH (ref 83–108)
pO2, Arterial: 219 mmHg — ABNORMAL HIGH (ref 83–108)

## 2024-10-14 LAB — DIC (DISSEMINATED INTRAVASCULAR COAGULATION)PANEL
D-Dimer, Quant: 0.86 ug{FEU}/mL — ABNORMAL HIGH (ref 0.00–0.50)
Fibrinogen: 362 mg/dL (ref 210–475)
INR: 1.2 (ref 0.8–1.2)
Platelets: 79 K/uL — ABNORMAL LOW (ref 150–400)
Prothrombin Time: 16.3 s — ABNORMAL HIGH (ref 11.4–15.2)
aPTT: 34 s (ref 24–36)

## 2024-10-14 LAB — BASIC METABOLIC PANEL WITH GFR
Anion gap: 9 (ref 5–15)
Anion gap: 8 (ref 5–15)
Anion gap: 12 (ref 5–15)
BUN: 27 mg/dL — ABNORMAL HIGH (ref 8–23)
BUN: 23 mg/dL (ref 8–23)
BUN: 24 mg/dL — ABNORMAL HIGH (ref 8–23)
CO2: 25 mmol/L (ref 22–32)
CO2: 26 mmol/L (ref 22–32)
CO2: 22 mmol/L (ref 22–32)
Calcium: 8.6 mg/dL — ABNORMAL LOW (ref 8.9–10.3)
Calcium: 9.3 mg/dL (ref 8.9–10.3)
Calcium: 9.1 mg/dL (ref 8.9–10.3)
Chloride: 95 mmol/L — ABNORMAL LOW (ref 98–111)
Chloride: 98 mmol/L (ref 98–111)
Chloride: 97 mmol/L — ABNORMAL LOW (ref 98–111)
Creatinine, Ser: 1.6 mg/dL — ABNORMAL HIGH (ref 0.61–1.24)
Creatinine, Ser: 1.5 mg/dL — ABNORMAL HIGH (ref 0.61–1.24)
Creatinine, Ser: 1.59 mg/dL — ABNORMAL HIGH (ref 0.61–1.24)
GFR, Estimated: 48 mL/min — ABNORMAL LOW
GFR, Estimated: 52 mL/min — ABNORMAL LOW
GFR, Estimated: 48 mL/min — ABNORMAL LOW
Glucose, Bld: 115 mg/dL — ABNORMAL HIGH (ref 70–99)
Glucose, Bld: 72 mg/dL (ref 70–99)
Glucose, Bld: 167 mg/dL — ABNORMAL HIGH (ref 70–99)
Potassium: 3.7 mmol/L (ref 3.5–5.1)
Potassium: 3.7 mmol/L (ref 3.5–5.1)
Potassium: 4 mmol/L (ref 3.5–5.1)
Sodium: 129 mmol/L — ABNORMAL LOW (ref 135–145)
Sodium: 132 mmol/L — ABNORMAL LOW (ref 135–145)
Sodium: 131 mmol/L — ABNORMAL LOW (ref 135–145)

## 2024-10-14 LAB — ECHO INTRAOPERATIVE TEE
Height: 78 in
Weight: 3492.09 [oz_av]

## 2024-10-14 LAB — CBC
HCT: 32 % — ABNORMAL LOW (ref 39.0–52.0)
HCT: 25.6 % — ABNORMAL LOW (ref 39.0–52.0)
HCT: 23.8 % — ABNORMAL LOW (ref 39.0–52.0)
Hemoglobin: 11.3 g/dL — ABNORMAL LOW (ref 13.0–17.0)
Hemoglobin: 8.7 g/dL — ABNORMAL LOW (ref 13.0–17.0)
Hemoglobin: 8.1 g/dL — ABNORMAL LOW (ref 13.0–17.0)
MCH: 31.4 pg (ref 26.0–34.0)
MCH: 31.2 pg (ref 26.0–34.0)
MCH: 31.8 pg (ref 26.0–34.0)
MCHC: 35.3 g/dL (ref 30.0–36.0)
MCHC: 34 g/dL (ref 30.0–36.0)
MCHC: 34 g/dL (ref 30.0–36.0)
MCV: 88.9 fL (ref 80.0–100.0)
MCV: 91.8 fL (ref 80.0–100.0)
MCV: 93.3 fL (ref 80.0–100.0)
Platelets: 66 K/uL — ABNORMAL LOW (ref 150–400)
Platelets: 77 K/uL — ABNORMAL LOW (ref 150–400)
Platelets: 87 K/uL — ABNORMAL LOW (ref 150–400)
RBC: 3.6 MIL/uL — ABNORMAL LOW (ref 4.22–5.81)
RBC: 2.79 MIL/uL — ABNORMAL LOW (ref 4.22–5.81)
RBC: 2.55 MIL/uL — ABNORMAL LOW (ref 4.22–5.81)
RDW: 16.2 % — ABNORMAL HIGH (ref 11.5–15.5)
RDW: 16.4 % — ABNORMAL HIGH (ref 11.5–15.5)
RDW: 16.7 % — ABNORMAL HIGH (ref 11.5–15.5)
WBC: 6.9 K/uL (ref 4.0–10.5)
WBC: 18.2 K/uL — ABNORMAL HIGH (ref 4.0–10.5)
WBC: 19 K/uL — ABNORMAL HIGH (ref 4.0–10.5)
nRBC: 0 % (ref 0.0–0.2)
nRBC: 0 % (ref 0.0–0.2)
nRBC: 0 % (ref 0.0–0.2)

## 2024-10-14 LAB — POCT I-STAT, CHEM 8
BUN: 26 mg/dL — ABNORMAL HIGH (ref 8–23)
BUN: 26 mg/dL — ABNORMAL HIGH (ref 8–23)
BUN: 24 mg/dL — ABNORMAL HIGH (ref 8–23)
BUN: 24 mg/dL — ABNORMAL HIGH (ref 8–23)
BUN: 25 mg/dL — ABNORMAL HIGH (ref 8–23)
Calcium, Ion: 1.25 mmol/L (ref 1.15–1.40)
Calcium, Ion: 1.26 mmol/L (ref 1.15–1.40)
Calcium, Ion: 1.17 mmol/L (ref 1.15–1.40)
Calcium, Ion: 1.11 mmol/L — ABNORMAL LOW (ref 1.15–1.40)
Calcium, Ion: 1.26 mmol/L (ref 1.15–1.40)
Chloride: 93 mmol/L — ABNORMAL LOW (ref 98–111)
Chloride: 95 mmol/L — ABNORMAL LOW (ref 98–111)
Chloride: 92 mmol/L — ABNORMAL LOW (ref 98–111)
Chloride: 92 mmol/L — ABNORMAL LOW (ref 98–111)
Chloride: 93 mmol/L — ABNORMAL LOW (ref 98–111)
Creatinine, Ser: 1.6 mg/dL — ABNORMAL HIGH (ref 0.61–1.24)
Creatinine, Ser: 1.6 mg/dL — ABNORMAL HIGH (ref 0.61–1.24)
Creatinine, Ser: 1.5 mg/dL — ABNORMAL HIGH (ref 0.61–1.24)
Creatinine, Ser: 1.6 mg/dL — ABNORMAL HIGH (ref 0.61–1.24)
Creatinine, Ser: 1.5 mg/dL — ABNORMAL HIGH (ref 0.61–1.24)
Glucose, Bld: 128 mg/dL — ABNORMAL HIGH (ref 70–99)
Glucose, Bld: 186 mg/dL — ABNORMAL HIGH (ref 70–99)
Glucose, Bld: 173 mg/dL — ABNORMAL HIGH (ref 70–99)
Glucose, Bld: 173 mg/dL — ABNORMAL HIGH (ref 70–99)
Glucose, Bld: 154 mg/dL — ABNORMAL HIGH (ref 70–99)
HCT: 34 % — ABNORMAL LOW (ref 39.0–52.0)
HCT: 34 % — ABNORMAL LOW (ref 39.0–52.0)
HCT: 29 % — ABNORMAL LOW (ref 39.0–52.0)
HCT: 26 % — ABNORMAL LOW (ref 39.0–52.0)
HCT: 26 % — ABNORMAL LOW (ref 39.0–52.0)
Hemoglobin: 11.6 g/dL — ABNORMAL LOW (ref 13.0–17.0)
Hemoglobin: 11.6 g/dL — ABNORMAL LOW (ref 13.0–17.0)
Hemoglobin: 9.9 g/dL — ABNORMAL LOW (ref 13.0–17.0)
Hemoglobin: 8.8 g/dL — ABNORMAL LOW (ref 13.0–17.0)
Hemoglobin: 8.8 g/dL — ABNORMAL LOW (ref 13.0–17.0)
Potassium: 4.2 mmol/L (ref 3.5–5.1)
Potassium: 4.2 mmol/L (ref 3.5–5.1)
Potassium: 3.7 mmol/L (ref 3.5–5.1)
Potassium: 3.5 mmol/L (ref 3.5–5.1)
Potassium: 3.4 mmol/L — ABNORMAL LOW (ref 3.5–5.1)
Sodium: 128 mmol/L — ABNORMAL LOW (ref 135–145)
Sodium: 132 mmol/L — ABNORMAL LOW (ref 135–145)
Sodium: 130 mmol/L — ABNORMAL LOW (ref 135–145)
Sodium: 132 mmol/L — ABNORMAL LOW (ref 135–145)
Sodium: 133 mmol/L — ABNORMAL LOW (ref 135–145)
TCO2: 22 mmol/L (ref 22–32)
TCO2: 28 mmol/L (ref 22–32)
TCO2: 24 mmol/L (ref 22–32)
TCO2: 24 mmol/L (ref 22–32)
TCO2: 27 mmol/L (ref 22–32)

## 2024-10-14 LAB — GLUCOSE, CAPILLARY
Glucose-Capillary: 71 mg/dL (ref 70–99)
Glucose-Capillary: 128 mg/dL — ABNORMAL HIGH (ref 70–99)
Glucose-Capillary: 68 mg/dL — ABNORMAL LOW (ref 70–99)
Glucose-Capillary: 68 mg/dL — ABNORMAL LOW (ref 70–99)
Glucose-Capillary: 138 mg/dL — ABNORMAL HIGH (ref 70–99)
Glucose-Capillary: 161 mg/dL — ABNORMAL HIGH (ref 70–99)
Glucose-Capillary: 156 mg/dL — ABNORMAL HIGH (ref 70–99)
Glucose-Capillary: 170 mg/dL — ABNORMAL HIGH (ref 70–99)
Glucose-Capillary: 157 mg/dL — ABNORMAL HIGH (ref 70–99)
Glucose-Capillary: 129 mg/dL — ABNORMAL HIGH (ref 70–99)
Glucose-Capillary: 134 mg/dL — ABNORMAL HIGH (ref 70–99)
Glucose-Capillary: 159 mg/dL — ABNORMAL HIGH (ref 70–99)

## 2024-10-14 LAB — POCT I-STAT EG7
Acid-base deficit: 3 mmol/L — ABNORMAL HIGH (ref 0.0–2.0)
Bicarbonate: 22.5 mmol/L (ref 20.0–28.0)
Calcium, Ion: 1.16 mmol/L (ref 1.15–1.40)
HCT: 29 % — ABNORMAL LOW (ref 39.0–52.0)
Hemoglobin: 9.9 g/dL — ABNORMAL LOW (ref 13.0–17.0)
O2 Saturation: 79 %
Potassium: 3.8 mmol/L (ref 3.5–5.1)
Sodium: 131 mmol/L — ABNORMAL LOW (ref 135–145)
TCO2: 24 mmol/L (ref 22–32)
pCO2, Ven: 40.3 mmHg — ABNORMAL LOW (ref 44–60)
pH, Ven: 7.355 (ref 7.25–7.43)
pO2, Ven: 45 mmHg (ref 32–45)

## 2024-10-14 LAB — APTT
aPTT: 49 s — ABNORMAL HIGH (ref 24–36)
aPTT: 34 s (ref 24–36)

## 2024-10-14 LAB — PREPARE RBC (CROSSMATCH)

## 2024-10-14 LAB — PLATELET COUNT: Platelets: 62 K/uL — ABNORMAL LOW (ref 150–400)

## 2024-10-14 LAB — HEMOGLOBIN AND HEMATOCRIT, BLOOD
HCT: 25.7 % — ABNORMAL LOW (ref 39.0–52.0)
Hemoglobin: 9 g/dL — ABNORMAL LOW (ref 13.0–17.0)

## 2024-10-14 LAB — PROTIME-INR
INR: 1.2 (ref 0.8–1.2)
Prothrombin Time: 16 s — ABNORMAL HIGH (ref 11.4–15.2)

## 2024-10-14 LAB — MAGNESIUM
Magnesium: 2 mg/dL (ref 1.7–2.4)
Magnesium: 1.8 mg/dL (ref 1.7–2.4)
Magnesium: 2.5 mg/dL — ABNORMAL HIGH (ref 1.7–2.4)

## 2024-10-14 LAB — FIBRINOGEN: Fibrinogen: 326 mg/dL (ref 210–475)

## 2024-10-14 LAB — LACTATE DEHYDROGENASE: LDH: 746 U/L — ABNORMAL HIGH (ref 105–235)

## 2024-10-14 MED ORDER — ONDANSETRON HCL 4 MG/2ML IJ SOLN
4.0000 mg | Freq: Four times a day (QID) | INTRAMUSCULAR | Status: DC | PRN
  Administered 2024-10-16: 4 mg via INTRAVENOUS
  Filled 2024-10-14: qty 2

## 2024-10-14 MED ORDER — SODIUM CHLORIDE 0.9% FLUSH
3.0000 mL | Freq: Two times a day (BID) | INTRAVENOUS | Status: DC
  Administered 2024-10-15 – 2024-10-19 (×10): 3 mL via INTRAVENOUS

## 2024-10-14 MED ORDER — ORAL CARE MOUTH RINSE: 15.0000 mL | OROMUCOSAL | Status: DC | PRN

## 2024-10-14 MED ORDER — PHENYLEPHRINE 80 MCG/ML (10ML) SYRINGE FOR IV PUSH (FOR BLOOD PRESSURE SUPPORT)
PREFILLED_SYRINGE | INTRAVENOUS | Status: AC
  Filled 2024-10-14: qty 10

## 2024-10-14 MED ORDER — DOCUSATE SODIUM 100 MG PO CAPS: 200.0000 mg | ORAL_CAPSULE | Freq: Every day | ORAL | Status: DC

## 2024-10-14 MED ORDER — ORAL CARE MOUTH RINSE: 15.0000 mL | OROMUCOSAL | Status: DC

## 2024-10-14 MED ORDER — LACTATED RINGERS IV SOLN: INTRAVENOUS | Status: DC | PRN

## 2024-10-14 MED ORDER — PROPOFOL 10 MG/ML IV BOLUS
INTRAVENOUS | Status: AC
  Filled 2024-10-14: qty 20

## 2024-10-14 MED ORDER — ROCURONIUM BROMIDE 10 MG/ML (PF) SYRINGE
PREFILLED_SYRINGE | INTRAVENOUS | Status: DC | PRN
  Administered 2024-10-14 (×2): 50 mg via INTRAVENOUS
  Administered 2024-10-14: 100 mg via INTRAVENOUS

## 2024-10-14 MED ORDER — FENTANYL CITRATE (PF) 250 MCG/5ML IJ SOLN
INTRAMUSCULAR | Status: AC
  Filled 2024-10-14: qty 5

## 2024-10-14 MED ORDER — PROPOFOL 10 MG/ML IV BOLUS
INTRAVENOUS | Status: DC | PRN
  Administered 2024-10-14: 30 mg via INTRAVENOUS
  Administered 2024-10-14: 50 mg via INTRAVENOUS

## 2024-10-14 MED ORDER — SODIUM CHLORIDE 0.9 % IV SOLN
600.0000 mg | Freq: Once | INTRAVENOUS | Status: AC
  Administered 2024-10-15: 600 mg via INTRAVENOUS
  Filled 2024-10-14: qty 10

## 2024-10-14 MED ORDER — ARTIFICIAL TEARS OPHTHALMIC OINT
TOPICAL_OINTMENT | OPHTHALMIC | Status: DC | PRN
  Administered 2024-10-14: 1 via OPHTHALMIC

## 2024-10-14 MED ORDER — SODIUM CHLORIDE 0.45 % IV SOLN: INTRAVENOUS | Status: AC | PRN

## 2024-10-14 MED ORDER — HEPARIN SODIUM (PORCINE) 1000 UNIT/ML IJ SOLN
INTRAMUSCULAR | Status: AC
  Filled 2024-10-14: qty 10

## 2024-10-14 MED ORDER — PANTOPRAZOLE SODIUM 40 MG PO TBEC: 40.0000 mg | DELAYED_RELEASE_TABLET | Freq: Every day | ORAL | Status: DC

## 2024-10-14 MED ORDER — SODIUM CHLORIDE 0.9 % IV SOLN: 10.0000 mL/h | Freq: Once | INTRAVENOUS | Status: AC

## 2024-10-14 MED ORDER — POLYETHYLENE GLYCOL 3350 17 G PO PACK
17.0000 g | PACK | Freq: Every day | ORAL | Status: DC
  Administered 2024-10-14 – 2024-10-15 (×2): 17 g
  Filled 2024-10-14 (×2): qty 1

## 2024-10-14 MED ORDER — MAGNESIUM SULFATE 4 GM/100ML IV SOLN
4.0000 g | Freq: Once | INTRAVENOUS | Status: AC
  Administered 2024-10-14: 4 g via INTRAVENOUS
  Filled 2024-10-14: qty 100

## 2024-10-14 MED ORDER — ACETAMINOPHEN 160 MG/5ML PO SOLN: 650.0000 mg | Freq: Once | ORAL | Status: AC

## 2024-10-14 MED ORDER — ACETAMINOPHEN 160 MG/5ML PO SOLN
1000.0000 mg | Freq: Four times a day (QID) | ORAL | Status: AC
  Administered 2024-10-14 – 2024-10-15 (×2): 1000 mg
  Filled 2024-10-14 (×2): qty 40.6

## 2024-10-14 MED ORDER — BISACODYL 5 MG PO TBEC
10.0000 mg | DELAYED_RELEASE_TABLET | Freq: Every day | ORAL | Status: DC
  Administered 2024-10-15 – 2024-10-17 (×3): 10 mg via ORAL
  Filled 2024-10-14 (×3): qty 2

## 2024-10-14 MED ORDER — SODIUM CHLORIDE 0.9 % IV SOLN
24.0000 ug | Freq: Once | INTRAVENOUS | Status: AC
  Administered 2024-10-14: 24 ug via INTRAVENOUS
  Filled 2024-10-14: qty 6

## 2024-10-14 MED ORDER — HEMOSTATIC AGENTS (NO CHARGE) OPTIME
TOPICAL | Status: DC | PRN
  Administered 2024-10-14: 1 via TOPICAL

## 2024-10-14 MED ORDER — NOREPINEPHRINE 4 MG/250ML-% IV SOLN
0.0000 ug/min | INTRAVENOUS | Status: DC
  Administered 2024-10-14: 11 ug/min via INTRAVENOUS
  Filled 2024-10-14: qty 250

## 2024-10-14 MED ORDER — MILRINONE LACTATE IN DEXTROSE 20-5 MG/100ML-% IV SOLN: 0.3750 ug/kg/min | INTRAVENOUS | Status: DC

## 2024-10-14 MED ORDER — PROTAMINE SULFATE 10 MG/ML IV SOLN
INTRAVENOUS | Status: AC
  Filled 2024-10-14: qty 5

## 2024-10-14 MED ORDER — CALCIUM CHLORIDE 10 % IV SOLN
INTRAVENOUS | Status: AC
  Filled 2024-10-14: qty 10

## 2024-10-14 MED ORDER — GABAPENTIN 250 MG/5ML PO SOLN
200.0000 mg | Freq: Every day | ORAL | Status: DC
  Administered 2024-10-14: 200 mg
  Filled 2024-10-14 (×2): qty 4

## 2024-10-14 MED ORDER — PANTOPRAZOLE SODIUM 40 MG PO TBEC
40.0000 mg | DELAYED_RELEASE_TABLET | Freq: Every day | ORAL | Status: DC
  Administered 2024-10-16 – 2024-11-02 (×18): 40 mg via ORAL
  Filled 2024-10-14 (×18): qty 1

## 2024-10-14 MED ORDER — MORPHINE SULFATE (PF) 2 MG/ML IV SOLN: 1.0000 mg | INTRAVENOUS | Status: DC | PRN

## 2024-10-14 MED ORDER — ROCURONIUM BROMIDE 10 MG/ML (PF) SYRINGE
PREFILLED_SYRINGE | INTRAVENOUS | Status: AC
  Filled 2024-10-14: qty 10

## 2024-10-14 MED ORDER — ALBUMIN HUMAN 5 % IV SOLN
250.0000 mL | INTRAVENOUS | Status: AC | PRN
  Administered 2024-10-14 (×3): 12.5 g via INTRAVENOUS

## 2024-10-14 MED ORDER — SODIUM CHLORIDE 0.9 % IV SOLN: 0.3000 ug/kg | Freq: Once | INTRAVENOUS | Status: DC

## 2024-10-14 MED ORDER — THROMBIN 20000 UNITS EX SOLR: OROMUCOSAL | Status: DC | PRN

## 2024-10-14 MED ORDER — LACTATED RINGERS IV SOLN: 500.0000 mL | Freq: Once | INTRAVENOUS | Status: DC | PRN

## 2024-10-14 MED ORDER — HEPARIN 6000 UNIT IRRIGATION SOLUTION
Status: DC | PRN
  Administered 2024-10-14: 1

## 2024-10-14 MED ORDER — FLUCONAZOLE IN SODIUM CHLORIDE 400-0.9 MG/200ML-% IV SOLN
400.0000 mg | Freq: Once | INTRAVENOUS | Status: AC
  Administered 2024-10-15: 400 mg via INTRAVENOUS
  Filled 2024-10-14: qty 200

## 2024-10-14 MED ORDER — SODIUM CHLORIDE 0.9% IV SOLUTION: Freq: Once | INTRAVENOUS | Status: AC

## 2024-10-14 MED ORDER — POTASSIUM CHLORIDE 20 MEQ PO PACK
40.0000 meq | PACK | Freq: Once | ORAL | Status: AC
  Administered 2024-10-14: 40 meq via ORAL
  Filled 2024-10-14: qty 2

## 2024-10-14 MED ORDER — HEPARIN SODIUM (PORCINE) 1000 UNIT/ML IJ SOLN
INTRAMUSCULAR | Status: DC | PRN
  Administered 2024-10-14: 35000 [IU] via INTRAVENOUS

## 2024-10-14 MED ORDER — FENTANYL BOLUS VIA INFUSION
25.0000 ug | INTRAVENOUS | Status: DC | PRN
  Administered 2024-10-14 (×2): 50 ug via INTRAVENOUS
  Administered 2024-10-15: 25 ug via INTRAVENOUS

## 2024-10-14 MED ORDER — NOREPINEPHRINE 16 MG/250ML-% IV SOLN
0.0000 ug/min | INTRAVENOUS | Status: DC
  Administered 2024-10-14: 10 ug/min via INTRAVENOUS
  Filled 2024-10-14: qty 250

## 2024-10-14 MED ORDER — PROTAMINE SULFATE 10 MG/ML IV SOLN
INTRAVENOUS | Status: AC
  Filled 2024-10-14: qty 25

## 2024-10-14 MED ORDER — VANCOMYCIN HCL IN DEXTROSE 1-5 GM/200ML-% IV SOLN
1000.0000 mg | Freq: Two times a day (BID) | INTRAVENOUS | Status: AC
  Administered 2024-10-14 – 2024-10-15 (×3): 1000 mg via INTRAVENOUS
  Filled 2024-10-14 (×3): qty 200

## 2024-10-14 MED ORDER — FENTANYL CITRATE (PF) 50 MCG/ML IJ SOSY
25.0000 ug | PREFILLED_SYRINGE | Freq: Once | INTRAMUSCULAR | Status: DC
  Filled 2024-10-14: qty 1

## 2024-10-14 MED ORDER — DOCUSATE SODIUM 50 MG/5ML PO LIQD: 100.0000 mg | Freq: Two times a day (BID) | ORAL | Status: DC

## 2024-10-14 MED ORDER — OXYCODONE HCL 5 MG PO TABS
5.0000 mg | ORAL_TABLET | ORAL | Status: DC | PRN
  Administered 2024-10-14 – 2024-10-15 (×2): 10 mg via ORAL
  Filled 2024-10-14 (×2): qty 2

## 2024-10-14 MED ORDER — DEXMEDETOMIDINE HCL IN NACL 400 MCG/100ML IV SOLN
0.0000 ug/kg/h | INTRAVENOUS | Status: DC
  Administered 2024-10-14 – 2024-10-15 (×3): 0.7 ug/kg/h via INTRAVENOUS
  Filled 2024-10-14 (×3): qty 100

## 2024-10-14 MED ORDER — ALBUMIN HUMAN 5 % IV SOLN: INTRAVENOUS | Status: DC | PRN

## 2024-10-14 MED ORDER — SODIUM CHLORIDE 0.9 % IV SOLN
20.0000 ug | Freq: Once | INTRAVENOUS | Status: AC
  Administered 2024-10-14: 20 ug via INTRAVENOUS
  Filled 2024-10-14: qty 5

## 2024-10-14 MED ORDER — BISACODYL 10 MG RE SUPP
10.0000 mg | Freq: Every day | RECTAL | Status: DC
  Filled 2024-10-14 (×2): qty 1

## 2024-10-14 MED ORDER — CHLORHEXIDINE GLUCONATE 0.12 % MT SOLN
15.0000 mL | OROMUCOSAL | Status: AC
  Administered 2024-10-14: 15 mL via OROMUCOSAL
  Filled 2024-10-14: qty 15

## 2024-10-14 MED ORDER — CHLORHEXIDINE GLUCONATE CLOTH 2 % EX PADS
6.0000 | MEDICATED_PAD | Freq: Every day | CUTANEOUS | Status: DC
  Administered 2024-10-14 – 2024-11-02 (×20): 6 via TOPICAL

## 2024-10-14 MED ORDER — ROCURONIUM BROMIDE 10 MG/ML (PF) SYRINGE
PREFILLED_SYRINGE | INTRAVENOUS | Status: AC
  Filled 2024-10-14: qty 30

## 2024-10-14 MED ORDER — MIDAZOLAM HCL (PF) 10 MG/2ML IJ SOLN
INTRAMUSCULAR | Status: AC
  Filled 2024-10-14: qty 2

## 2024-10-14 MED ORDER — HEPARIN 6000 UNIT IRRIGATION SOLUTION
Status: AC
  Filled 2024-10-14: qty 500

## 2024-10-14 MED ORDER — PROTAMINE SULFATE 10 MG/ML IV SOLN
INTRAVENOUS | Status: DC | PRN
  Administered 2024-10-14: 300 mg via INTRAVENOUS

## 2024-10-14 MED ORDER — FAMOTIDINE IN NACL 20-0.9 MG/50ML-% IV SOLN
20.0000 mg | Freq: Two times a day (BID) | INTRAVENOUS | Status: AC
  Administered 2024-10-14 (×2): 20 mg via INTRAVENOUS
  Filled 2024-10-14 (×2): qty 50

## 2024-10-14 MED ORDER — CALCIUM CHLORIDE 10 % IV SOLN
INTRAVENOUS | Status: DC | PRN
  Administered 2024-10-14 (×8): 250 mg via INTRAVENOUS

## 2024-10-14 MED ORDER — ORAL CARE MOUTH RINSE
15.0000 mL | OROMUCOSAL | Status: DC
  Administered 2024-10-14 – 2024-10-15 (×13): 15 mL via OROMUCOSAL

## 2024-10-14 MED ORDER — LACTATED RINGERS IV SOLN: INTRAVENOUS | Status: AC

## 2024-10-14 MED ORDER — ACETAMINOPHEN 500 MG PO TABS
1000.0000 mg | ORAL_TABLET | Freq: Four times a day (QID) | ORAL | Status: AC
  Administered 2024-10-15 – 2024-10-19 (×16): 1000 mg via ORAL
  Filled 2024-10-14 (×18): qty 2

## 2024-10-14 MED ORDER — ACETAMINOPHEN 650 MG RE SUPP
650.0000 mg | Freq: Once | RECTAL | Status: AC
  Administered 2024-10-14: 650 mg via RECTAL
  Filled 2024-10-14: qty 1

## 2024-10-14 MED ORDER — DEXTROSE 50 % IV SOLN
0.0000 mL | INTRAVENOUS | Status: DC | PRN
  Filled 2024-10-14: qty 50

## 2024-10-14 MED ORDER — SODIUM CHLORIDE 0.9% FLUSH: 10.0000 mL | INTRAVENOUS | Status: DC | PRN

## 2024-10-14 MED ORDER — SODIUM CHLORIDE 0.9% FLUSH
10.0000 mL | Freq: Two times a day (BID) | INTRAVENOUS | Status: DC
  Administered 2024-10-14 – 2024-10-15 (×4): 10 mL
  Administered 2024-10-16: 20 mL
  Administered 2024-10-16 – 2024-10-19 (×7): 10 mL

## 2024-10-14 MED ORDER — THROMBIN (RECOMBINANT) 20000 UNITS EX SOLR
CUTANEOUS | Status: AC
  Filled 2024-10-14: qty 20000

## 2024-10-14 MED ORDER — FENTANYL 2500MCG IN NS 250ML (10MCG/ML) PREMIX INFUSION
0.0000 ug/h | INTRAVENOUS | Status: DC
  Administered 2024-10-14: 50 ug/h via INTRAVENOUS
  Filled 2024-10-14: qty 250

## 2024-10-14 MED ORDER — METOCLOPRAMIDE HCL 5 MG/ML IJ SOLN
10.0000 mg | Freq: Four times a day (QID) | INTRAMUSCULAR | Status: AC
  Administered 2024-10-14 – 2024-10-19 (×20): 10 mg via INTRAVENOUS
  Filled 2024-10-14 (×20): qty 2

## 2024-10-14 MED ORDER — CEFAZOLIN SODIUM-DEXTROSE 2-4 GM/100ML-% IV SOLN
2.0000 g | Freq: Three times a day (TID) | INTRAVENOUS | Status: AC
  Administered 2024-10-14 – 2024-10-16 (×6): 2 g via INTRAVENOUS
  Filled 2024-10-14 (×6): qty 100

## 2024-10-14 MED ORDER — SODIUM CHLORIDE 0.9 % IV SOLN: 250.0000 mL | INTRAVENOUS | Status: AC

## 2024-10-14 MED ORDER — THROMBIN 20000 UNITS EX KIT
PACK | CUTANEOUS | Status: DC | PRN
  Administered 2024-10-14: 20000 [IU] via TOPICAL

## 2024-10-14 MED ORDER — FENTANYL CITRATE (PF) 250 MCG/5ML IJ SOLN
INTRAMUSCULAR | Status: DC | PRN
  Administered 2024-10-14 (×2): 250 ug via INTRAVENOUS
  Administered 2024-10-14: 100 ug via INTRAVENOUS
  Administered 2024-10-14: 250 ug via INTRAVENOUS
  Administered 2024-10-14: 150 ug via INTRAVENOUS
  Administered 2024-10-14: 100 ug via INTRAVENOUS

## 2024-10-14 MED ORDER — MILRINONE LACTATE IN DEXTROSE 20-5 MG/100ML-% IV SOLN
0.1250 ug/kg/min | INTRAVENOUS | Status: DC
  Administered 2024-10-14: 0.375 ug/kg/min via INTRAVENOUS
  Administered 2024-10-15: 0.25 ug/kg/min via INTRAVENOUS
  Administered 2024-10-15: 0.375 ug/kg/min via INTRAVENOUS
  Administered 2024-10-16 – 2024-10-23 (×13): 0.25 ug/kg/min via INTRAVENOUS
  Administered 2024-10-24: 0.125 ug/kg/min via INTRAVENOUS
  Filled 2024-10-14 (×18): qty 100

## 2024-10-14 MED ORDER — INSULIN REGULAR(HUMAN) IN NACL 100-0.9 UT/100ML-% IV SOLN
INTRAVENOUS | Status: AC
  Administered 2024-10-15: 1.9 [IU]/h via INTRAVENOUS
  Filled 2024-10-14: qty 100

## 2024-10-14 MED ORDER — LIDOCAINE 2% (20 MG/ML) 5 ML SYRINGE
INTRAMUSCULAR | Status: AC
  Filled 2024-10-14: qty 5

## 2024-10-14 MED ORDER — MIDAZOLAM HCL (PF) 5 MG/ML IJ SOLN
INTRAMUSCULAR | Status: DC | PRN
  Administered 2024-10-14 (×2): 2 mg via INTRAVENOUS
  Administered 2024-10-14 (×2): 1 mg via INTRAVENOUS
  Administered 2024-10-14: 2 mg via INTRAVENOUS

## 2024-10-14 MED ORDER — SODIUM CHLORIDE 0.9 % IV SOLN: INTRAVENOUS | Status: AC

## 2024-10-14 MED ORDER — HEPARIN SODIUM (PORCINE) 1000 UNIT/ML IJ SOLN
INTRAMUSCULAR | Status: AC
  Filled 2024-10-14: qty 1

## 2024-10-14 MED ORDER — SODIUM CHLORIDE (PF) 0.9 % IJ SOLN
INTRAMUSCULAR | Status: DC | PRN
  Administered 2024-10-14: 1000 mL

## 2024-10-14 MED ORDER — SODIUM CHLORIDE 0.9 % IV SOLN
3.0000 ug/min | INTRAVENOUS | Status: DC
  Administered 2024-10-14: 6 ug/min via INTRAVENOUS
  Administered 2024-10-16: 5 ug/min via INTRAVENOUS
  Administered 2024-10-18 – 2024-10-20 (×3): 3 ug/min via INTRAVENOUS
  Filled 2024-10-14 (×5): qty 10

## 2024-10-14 MED ORDER — POTASSIUM CHLORIDE 10 MEQ/50ML IV SOLN
10.0000 meq | INTRAVENOUS | Status: AC
  Administered 2024-10-14 (×3): 10 meq via INTRAVENOUS

## 2024-10-14 MED ORDER — SODIUM CHLORIDE 0.9% FLUSH: 3.0000 mL | INTRAVENOUS | Status: DC | PRN

## 2024-10-14 MED ORDER — PROPOFOL 1000 MG/100ML IV EMUL
0.0000 ug/kg/min | INTRAVENOUS | Status: DC
  Administered 2024-10-14: 10 ug/kg/min via INTRAVENOUS
  Filled 2024-10-14: qty 100

## 2024-10-14 MED ORDER — DEXTROSE 50 % IV SOLN
12.5000 g | INTRAVENOUS | Status: AC
  Administered 2024-10-14: 12.5 g via INTRAVENOUS

## 2024-10-14 MED ORDER — EPINEPHRINE HCL 5 MG/250ML IV SOLN IN NS: 0.0000 ug/min | INTRAVENOUS | Status: DC

## 2024-10-14 MED ORDER — MIDAZOLAM HCL (PF) 2 MG/2ML IJ SOLN
2.0000 mg | INTRAMUSCULAR | Status: DC | PRN
  Administered 2024-10-15: 2 mg via INTRAVENOUS
  Filled 2024-10-14: qty 2

## 2024-10-14 MED ORDER — VANCOMYCIN HCL 1000 MG IV SOLR
INTRAVENOUS | Status: DC | PRN
  Administered 2024-10-14: 1000 mg via TOPICAL

## 2024-10-14 MED FILL — Potassium Chloride Inj 2 mEq/ML: INTRAVENOUS | Qty: 40 | Status: AC

## 2024-10-14 MED FILL — Heparin Sodium (Porcine) Inj 1000 Unit/ML: Qty: 1000 | Status: AC

## 2024-10-14 MED FILL — Magnesium Sulfate Inj 50%: INTRAMUSCULAR | Qty: 10 | Status: AC

## 2024-10-14 NOTE — Anesthesia Procedure Notes (Signed)
 Central Venous Catheter Insertion Performed by: Dorethea Cordella SQUIBB, DO, anesthesiologist Start/End12/31/2025 8:05 AM, 10/14/2024 8:20 AM Patient location: OR. Preanesthetic checklist: patient identified, IV checked, site marked, risks and benefits discussed, surgical consent, monitors and equipment checked, pre-op evaluation, timeout performed and anesthesia consent Position: Trendelenburg Lidocaine  1% used for infiltration and patient sedated Hand hygiene performed  and maximum sterile barriers used  Catheter size: 9 Fr Central line was placed.MAC introducer Procedure performed using ultrasound to evaluate access site. Ultrasound Notes:relevant anatomy identified, ultrasound used to visualize needle entry, vessel patent under ultrasound and image(s) printed for medical record. Attempts: 2 Following insertion, line sutured and dressing applied. Post procedure assessment: blood return through all ports, free fluid flow and no air  Patient tolerated the procedure well with no immediate complications.

## 2024-10-14 NOTE — Anesthesia Postprocedure Evaluation (Signed)
"   Anesthesia Post Note  Patient: James [name]  Procedure(s) Performed: INSERTION OF IMPLANTABLE LEFT VENTRICULAR ASSIST DEVICE HEARTMATE 3; REMOVAL OF IMPELLA 5.5 (Chest) ECHOCARDIOGRAM, TRANSESOPHAGEAL, INTRAOPERATIVE     Patient location during evaluation: SICU Anesthesia Type: General Level of consciousness: sedated Pain management: pain level controlled Vital Signs Assessment: post-procedure vital signs reviewed and stable Respiratory status: patient remains intubated per anesthesia plan Cardiovascular status: stable Postop Assessment: no apparent nausea or vomiting Anesthetic complications: no   There were no known notable events for this encounter.  Last Vitals:  Vitals:   10/14/24 1700 10/14/24 1703  BP:    Pulse:    Resp: 18 18  Temp: (!) 36 C (!) 36.2 C  SpO2:      Last Pain:  Vitals:   10/14/24 0400  TempSrc: Core (Comment)  PainSc: 0-No pain                 Cordella SQUIBB Makinsley Schiavi      "

## 2024-10-14 NOTE — Progress Notes (Signed)
 Outpatient Heart Failure CSW following patient during implant hospital admission.  LVAD Coordinator has spoken with pt wife/caregiver and no needs reported at this time.  CSW will continue to follow during admission and assist as needed.  Andriette HILARIO Leech, LCSW Clinical Social Worker Advanced Heart Failure Clinic Desk#: [phone] Cell#: [phone]

## 2024-10-14 NOTE — Progress Notes (Signed)
" °  Echocardiogram Echocardiogram Transesophageal has been performed.  Talbot Monarch 10/14/2024, 2:05 PM "

## 2024-10-14 NOTE — Progress Notes (Signed)
 Pharmacy Antibiotic Note  James [name] is a 63 y.o. male admitted on 10/01/2024 with surgical prophylaxis.  Pharmacy has been consulted for vancomycin dosing x3 doses postop.  Plan: Vancomycin 1000mg  IV q12h x3 Pharmacy will sign off, reconsult as needed   Height: 6' 6 (198.1 cm) Weight: 99 kg (218 lb 4.1 oz) IBW/kg (Calculated) : 91.4  Temp (24hrs), Avg:98.2 F (36.8 C), Min:97.7 F (36.5 C), Max:99 F (37.2 C)  Recent Labs  Lab 10/10/24 0508 10/10/24 1237 10/11/24 0227 10/12/24 0533 10/13/24 0515 10/14/24 0410 10/14/24 0837 10/14/24 0926 10/14/24 1019 10/14/24 1116 10/14/24 1207  WBC 6.8  --  8.2 8.2 8.0 6.9  --   --   --   --   --   CREATININE 1.83*   < > 1.78* 1.65* 1.67* 1.60* 1.60* 1.60* 1.50* 1.60* 1.50*   < > = values in this interval not displayed.    Estimated Creatinine Clearance: 65.2 mL/min (A) (by C-G formula based on SCr of 1.5 mg/dL (H)).    Allergies[1]    Ozell Jamaica, PharmD, BCPS, [facility] Clinical Pharmacist [phone] Please check AMION for all [facility] Pharmacy numbers 10/14/2024     [1] No Known Allergies

## 2024-10-14 NOTE — Anesthesia Procedure Notes (Signed)
 Central Venous Catheter Insertion Performed by: Dorethea Cordella SQUIBB, DO, anesthesiologist Start/End12/31/2025 8:22 AM, 10/14/2024 8:24 AM Patient location: OR. Preanesthetic checklist: patient identified, IV checked, site marked, risks and benefits discussed, surgical consent, monitors and equipment checked, pre-op evaluation, timeout performed and anesthesia consent Position: reverse Trendelenburg Hand hygiene performed  and maximum sterile barriers used  PA cath was placed.Swan type:thermodilution PA Cath depth:70 Attempts: 1 Patient tolerated the procedure well with no immediate complications.

## 2024-10-14 NOTE — Transfer of Care (Signed)
 Immediate Anesthesia Transfer of Care Note  Patient: James [name]  Procedure(s) Performed: INSERTION OF IMPLANTABLE LEFT VENTRICULAR ASSIST DEVICE HEARTMATE 3; REMOVAL OF IMPELLA 5.5 (Chest) ECHOCARDIOGRAM, TRANSESOPHAGEAL, INTRAOPERATIVE  Patient Location: SICU  Anesthesia Type:General  Level of Consciousness: sedated, unresponsive, and Patient remains intubated per anesthesia plan  Airway & Oxygen  Therapy: Patient remains intubated per anesthesia plan and Patient placed on Ventilator (see vital sign flow sheet for setting)  Post-op Assessment: Report given to RN and Post -op Vital signs reviewed and stable  Post vital signs: Reviewed and stable  Last Vitals:  Vitals Value Taken Time  BP    Temp 36.7 C 10/14/24 14:11  Pulse 80 10/14/24 14:11  Resp 16 10/14/24 14:11  SpO2 100 % 10/14/24 14:11  Vitals shown include unfiled device data.  Last Pain:  Vitals:   10/14/24 0400  TempSrc: Core (Comment)  PainSc: 0-No pain      Patients Stated Pain Goal: 0 (10/07/24 2000)  Complications: There were no known notable events for this encounter.

## 2024-10-14 NOTE — Interval H&P Note (Signed)
 History and Physical Interval Note:  10/14/2024 6:34 AM  James [name]  has presented today for surgery, with the diagnosis of CHF.  The various methods of treatment have been discussed with the patient and family. After consideration of risks, benefits and other options for treatment, the patient has consented to  Procedures: INSERTION OF IMPLANTABLE LEFT VENTRICULAR ASSIST DEVICE (N/A) ECHOCARDIOGRAM, TRANSESOPHAGEAL, INTRAOPERATIVE (N/A) as a surgical intervention.  The patient's history has been reviewed, patient examined, no change in status, stable for surgery.  I have reviewed the patient's chart and labs.  Questions were answered to the patient's satisfaction.     James [name]

## 2024-10-14 NOTE — Progress Notes (Addendum)
 "    Advanced Heart Failure Rounding Note   [facility] Cardiologist: Dr. Zenaida  Patient Profile   James [name] is a 63 y.o. male with history of stage D cardiomyopathy/chronic systolic heart failure now end-stage w/ inotrope dependence, CKD IIIa, hx VT, PAF, prostate cancer, meningioma.   Admitted for LVAD implant/ pre-VAD HF optimization.   Significant events:   12/18: Milrinone  titrated from 0.25 to 0.5 mcg/kg/min post RHC d/t low-output and pulmonary hypertension 12/19: NE added 12/22: Markedly elevated PA pressures, elevated PCWP and elevated SVR. Titrated off NE. Added DBA.  12/23: Impella 5.5 Implanted.  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 Heparin  > Bival.  12/31: HM III LVAD implant  Subjective:    Seen in ICU post HM III VAD implant.  RV sluggish on post-op TEE.  Remains intubated.  Received 2 u plts + 4 FFP + 700 cellsaver.  Arrived to the unit on 0.375 milrinone  + 6 epi + 7 NE + 20 iNO.   ~ 900 cc UOP in OR.   Objective:    HM III VAD interrogation: Speed 5100, Flow 4.7, Power 3.4, PI 3.4.  Weight Range: 99 kg Body mass index is 25.22 kg/m.   Vital Signs:   Temp:  [97.7 F (36.5 C)-99 F (37.2 C)] 98.1 F (36.7 C) (12/31 0615) Pulse Rate:  [50-159] 52 (12/31 0615) Resp:  [9-28] 20 (12/31 0615) SpO2:  [79 %-99 %] 96 % (12/31 0615) Arterial Line BP: (99-154)/(62-98) 124/71 (12/31 0615) Weight:  [99 kg] 99 kg (12/31 0610) Last BM Date : 10/13/24  Weight change: Filed Weights   10/12/24 0703 10/13/24 0545 10/14/24 0610  Weight: 95.7 kg 96.5 kg 99 kg   Intake/Output:  Intake/Output Summary (Last 24 hours) at 10/14/2024 1357 Last data filed at 10/14/2024 1320 Gross per 24 hour  Intake 5712.23 ml  Output 3480 ml  Net 2232.23 ml    Physical Exam   Physical Exam: GENERAL: Stable post-op CARDIAC:  Mechanical heart sounds with LVAD hum present.  ABDOMEN:  Soft, mildly distended LVAD exit site:   Dressing dry and intact.  No erythema or drainage.   Stabilization device present and accurately applied.  EXTREMITIES: 1-2+ generalized edema NEUROLOGIC: Sedated on vent   Telemetry   SR   Labs   CBC Recent Labs    10/13/24 0515 10/14/24 0410 10/14/24 0649 10/14/24 1047 10/14/24 1116 10/14/24 1203 10/14/24 1207  WBC 8.0 6.9  --   --   --   --   --   HGB 12.1* 11.3*   < > 9.0*   < > 9.2* 8.8*  HCT 34.5* 32.0*   < > 25.7*   < > 27.0* 26.0*  MCV 89.4 88.9  --   --   --   --   --   PLT 62* 66*  --  62*  --   --   --    < > = values in this interval not displayed.   Basic Metabolic Panel Recent Labs    87/69/74 0514 10/13/24 0515 10/14/24 0410 10/14/24 0649 10/14/24 1116 10/14/24 1203 10/14/24 1207  NA  --  128* 129*   < > 132* 132* 133*  K  --  3.8 3.7   < > 3.5 3.3* 3.4*  CL  --  95* 95*   < > 92*  --  93*  CO2  --  24 25  --   --   --   --   GLUCOSE  --  192*  115*   < > 173*  --  154*  BUN  --  27* 27*   < > 24*  --  25*  CREATININE  --  1.67* 1.60*   < > 1.60*  --  1.50*  CALCIUM   --  8.8* 8.6*  --   --   --   --   MG  --  1.7 2.0  --   --   --   --   PHOS 1.9*  --   --   --   --   --   --    < > = values in this interval not displayed.   Liver Function Tests No results for input(s): AST, ALT, ALKPHOS, BILITOT, PROT, ALBUMIN  in the last 72 hours.  BNP (last 3 results) Recent Labs    06/10/24 1213 07/09/24 1649 07/23/24 0959  BNP 1,071.5* 3,316.0* 595.8*   ProBNP (last 3 results) Recent Labs    10/01/24 1030  PROBNP 8,374.0*   Medications:    Scheduled Medications:  [MAR Hold] atorvastatin   40 mg Oral Daily   [MAR Hold] calcium  carbonate  1 tablet Oral BID WC   [MAR Hold] Chlorhexidine  Gluconate Cloth  6 each Topical Daily   Chlorhexidine  Gluconate Cloth  6 each Topical Once   [MAR Hold] feeding supplement  237 mL Oral BID BM   [MAR Hold] insulin  aspart  0-15 Units Subcutaneous TID WC   magnesium  sulfate  40 mEq Other To OR   [MAR Hold] melatonin  3 mg Oral Q24H   [MAR Hold]  mexiletine  200 mg Oral Q8H   mupirocin  ointment  1 Application Nasal BID   phenylephrine  0-100 mcg/min Intravenous To OR   [MAR Hold] polyethylene glycol  17 g Oral BID   potassium chloride   80 mEq Other To OR   [MAR Hold] sodium chloride  flush  10-40 mL Intracatheter Q12H   [MAR Hold] sodium chloride  flush  3 mL Intravenous Q12H   [MAR Hold] spironolactone   25 mg Oral QHS   tranexamic acid  2 mg/kg Intracatheter To OR   vancomycin  1,000 mg Other To OR   vasopressin  0.04 Units/min Intravenous To OR    Infusions:  amiodarone  30 mg/hr (10/14/24 0903)    ceFAZolin  (ANCEF ) IV     DOBUTamine      DOPamine     heparin  30,000 units/NS 1000 mL solution for CELLSAVER     milrinone  0.375 mcg/kg/min (10/14/24 0600)   nitroGLYCERIN      rifampin (RIFADIN) 600 mg in sodium chloride  0.9 % 100 mL IVPB     sodium bicarbonate  25 mEq (Impella PURGE) in dextrose  5 % 1000 mL bag      PRN Medications: [MAR Hold] sodium chloride , [MAR Hold] acetaminophen , [MAR Hold] acetaminophen , [MAR Hold] fentaNYL  (SUBLIMAZE ) injection, hemostatic agents (no charge) Optime, hemostatic agents (no charge) Optime, heparin , [MAR Hold] ondansetron  (ZOFRAN ) IV, [MAR Hold] ondansetron , [MAR Hold] mouth rinse, sodium chloride  (PF), [MAR Hold] sodium chloride  flush, [MAR Hold] sodium chloride  flush, [MAR Hold] sorbitol , Surgifoam 1 Gm with Thrombin 20,000 units (4 ml) topical solution, temazepam, thrombin, vancomycin  Assessment/Plan   1. Acute on chronic systolic CHF, NYHA IV: Nonischemic cardiomyopathy.  Last echo in 9/25 with EF 10-15%, severe LV dilation, mild RVE with mild-moderate RV dilation, moderate MR, moderate TR, dilated IVC. H/o low output HF, has been on home milrinone  0.25.  Extensive discussion recently re: transplant vs LVAD.  Finally, it appears that patient has settled on LVAD as  his preference.  He was brought in for hemodynamic optimization prior to LVAD.  RHC 12/18 showed elevated filling pressures with  mod-severe mixed pulmonary arterial/pulmonary venous hypertension and low CO despite milrinone  0.25, increased to 0.5. Impella 5.5 placed 12/23.   - S/p HM III LVAD implant by Dr. Lucas 10/14/24 - Currently on 0.375 milrinone  + 6 epi + 7 Norepi + 20 iNO.  Wean Norepi first.  - Follow hemodynamics on PA catheter - Keep intubated today. Discussed with CCM at bedside. Plan to try to extubate tomorrow then wean iNO. - No diuresis today  2. Pulmonary hypertension: Moderate-severe mixed pulmonary arterial/pulmonary venous hypertension on RHC 12/18. Much improved with Impella 5.5 - Follow on PAC post VAD  3.  AKI on CKD stage 3: Scr up to 2.3, trending down 1.6 this am (pre-op). Afternoon labs pending. - Likely cardiorenal syndrome.  4.  PVCs/NSVT: Exacerbated by dual inotrope support. Has Biotronik ICD.   - Continue IV amio. Mexiletine stopped post-op. May need to add back. Follow burden. - Tachy therapies off for VAD implant.   5.  Paroxysmal Atrial fibrillation: Remains in NSR.  - Continue amiodarone  as above.  - Off anticoagulation for VAD implant. Restart when okay with TCTS.  6. Meningioma: Monitoring.   7. Prostate cancer: Treated with radiation.   8. Thrombocytopenia: Slow fall in plts, down to 61 K.  - off heparin . SRA pending. HIT antibody negative. - received 2 u platelets and 4 ffp in OR  9. LUE swelling: DVT study for LUE negative.    CRITICAL CARE Performed by: COLLETTA SHAVER N   Total critical care time: 16 minutes  Critical care time was exclusive of separately billable procedures and treating other patients.  Critical care was necessary to treat or prevent imminent or life-threatening deterioration.  Critical care was time spent personally by me on the following activities: development of treatment plan with patient and/or surrogate as well as nursing, discussions with consultants, evaluation of patient's response to treatment, examination of patient, obtaining  history from patient or surrogate, ordering and performing treatments and interventions, ordering and review of laboratory studies, ordering and review of radiographic studies, pulse oximetry and re-evaluation of patient's condition.   Length of Stay: [address] Lyris Hitchman N, PA-C  10/14/2024, 1:57 PM  Advanced Heart Failure Team Pager [phone] (M-F; 7a - 5p)   Please visit [email or web address]: For overnight coverage please call cardiology fellow first. If fellow not available call Shock/ECMO MD on call.  For ECMO / Mechanical Support (Impella, IABP, LVAD) issues call Shock / ECMO MD on call.    "

## 2024-10-14 NOTE — Brief Op Note (Signed)
 10/01/2024 - 10/14/2024  12:16 PM  PATIENT:  James [name]  63 y.o. male  PRE-OPERATIVE DIAGNOSIS:  CONGESTIVE HEART FAILURE  POST-OPERATIVE DIAGNOSIS:  CONGESTIVE HEART FAILURE  PROCEDURE:   INSERTION OF IMPLANTABLE LEFT VENTRICULAR ASSIST DEVICE  IMPELLA 5.5 REMOVAL   ECHOCARDIOGRAM, TRANSESOPHAGEAL, INTRAOPERATIVE   SURGEON:  Lucas Dorise POUR, MD - Primary  PHYSICIAN ASSISTANT: Braydon Kullman  ASSISTANTS: Pesare, Piyanuch, RN, Scrub Person         Lawernce Suzen LABOR, RN, RN First Assistant   ANESTHESIA:   general  EBL:   BLOOD ADMINISTERED:  Platelet pheresis x 2                                  FFP x 2 units  DRAINS: Left pleural and mediastinal drains.    COUNTS:  Correct  DICTATION: .Dragon Dictation  PLAN OF CARE: Admit to inpatient   PATIENT DISPOSITION:  ICU - intubated and hemodynamically stable.   Delay start of Pharmacological VTE agent (>24hrs) due to surgical blood loss or risk of bleeding: yes

## 2024-10-14 NOTE — Op Note (Signed)
 CARDIOVASCULAR SURGERY OPERATIVE NOTE  James [name] [id number] 10/14/2024   Surgeon:  Dorise LOIS Fellers, MD  First Assistant: Laurel Becket, PA-C: An experienced assistant was required given the complexity of this surgery and the standard of surgical care. The assistant was needed for exposure, dissection, suctioning, retraction of delicate tissues and sutures, instrument exchange and for overall help during this procedure.   Preoperative Diagnosis: Class IV heart failure with LVEF < 20%   Postoperative Diagnosis:  Same   Procedure  Median Sternotomy Extracorporeal circulation Removal of Impella 5.5 4.   Implantation of HeartMate 3 Left Ventricular Assist Device.   Anesthesia:  General Endotracheal   Clinical History/Surgical Indication:  The patient is a 63 year old gentleman with ACC/AHA stage D NICM presenting with NYHA IV symptoms on home milrinone . A right axillary Impella 5.5 was inserted preop for optimization and he developed AKI that gradually recovered.  The operative procedure has been discussed with the patient and family including alternatives, benefits and risks; including but not limited to bleeding, blood transfusion, infection, stroke, myocardial infarction, pump failure or thrombus possibly requiring replacement, RV dysfunction requiring an RVAD or long term milrinone  therapy, heart block requiring a permanent pacemaker, organ dysfunction, and death.  The patient understands and agrees to proceed.      Preparation:  The patient was taken directly back to the OR room with the Impella in place. The consent was signed by me yesterday. Preoperative antibiotics were given. The old lines were removed. A left internal jugular pulmonary arterial line and left radial arterial line were placed by the anesthesia team. The patient was positioned supine on the operating room table. After being placed under general endotracheal anesthesia by the anesthesia team a foley  catheter was placed. The neck, chest, abdomen, and both legs were prepped with betadine  soap and solution and draped in the usual sterile manner. A surgical time-out was taken and the correct patient and operative procedure were confirmed with the nursing and anesthesia staff.   Pre-bypass TEE:   Complete TEE assessment was performed by Dr. Dorethea. This showed a dilated LV with EF < 20%. There was no AI, trace MR, trivial TR, fair RV systolic function and no PFO.  Median sternotomy:    A median sternotomy was performed. The pericardium was opened in the midline. Right ventricular function appeared mildly depressed. The ascending aorta was of normal size and had no palpable plaque. There were no contraindications to aortic cannulation or cross-clamping. The pericardium was opened along the left diaphragm out to the apex. Then a 4 cm transverse incision was made to the left of and superior to the umbilicus and continued down to the rectus fascia using electrocautery. A skin punch was used to create the drive line exit site about 3 cm below the right costal margin in the anterior axillary line. The tunneling device was passed in a subcutaneous plane from the transverse abdominal incision to the pericardium and then through the rectus fascia into the pre-peritoneal space. The other tunneling device was passed from the abdominal incision to the skin exit site.  Cardiopulmonary Bypass:   The patient was fully systemically heparinized and the ACT was maintained > 400 sec. The proximal aortic arch was cannulated with a 22 F aortic cannula for arterial inflow. Venous cannulation was performed via the right atrial appendage using a two-staged venous cannula. Carbon dioxide was insufflated into the pericardium at 6L/min throughout the procedure to minimize intracardiac air. The systemic temperature was allowed  to drift downward slightly during the procedure and the patient was actively rewarmed.  Removal of  Impella 5.5: Assisted by Laurel Becket, PA-C  The right axillary incision was opened and carried down to the axillary artery graft. The Impella 5.5 was turned down to P1 and withdrawn back across the aortic valve. It was then stopped and removed. The graft was clamped and ligated with a #1 silk tie about 1 cm distal to the anastomosis. The graft was divided and the stump ligated with a 3-0 Prolene pledgetted horizontal mattress suture. Hemostasis was complete. The distal section of graft was removed through the skin exit site. The wound was irrigated with saline. A 15 F Blake drain was placed below the pectoralis muscle since it was a deep wound with a cavity present from previous hematoma. The pectoralis muscle was approximated with 2-0 Vicryl suture. The subcutaneous tissue was approximated with 3-0 Vicryl suture. Skin was closed with subcuticular 3-0 Vicryl suture. The skin exit site was packed with Xeroform gauze.     Insertion of apical outflow cannula: Assisted by Laurel Becket, PA-C  The patient was placed on cardiopulmonary bypass. Laparotomy pads were placed behind the heart to aid exposure of the apex. A site was chosen on the anterior wall lateral to the LAD and superior to the true apex. A stab incision was made and a foley catheter placed through the coring knife and into the left ventricle. The balloon was inflated and with gentle traction on the catheter the coring knife was carefully advanced toward the mitral valve to create the ventriculotomy. The balloon was deflated and removed. A sump was placed into the LV and the ventricular core excised and sent to pathology. Trabeculations that might cause obstruction were excised. Then a series of 2-0 Ethibond horizontal mattress sutures were placed around the ventriculotomy. This took 17 sutures. The sutures were placed through the apical cuff. The sutures were tied and the apical cuff appeared to be well-sealed to the apex. Prevaleak was  applied to seal needle holes. Then volume was left in the heart and the lungs inflated. The HeartMate 3 was brought onto the operative field. The inflow cannula was inserted into the apical cuff and the apical cuff lock engaged.  The drive line was then tunneled using the previously placed tunneling devices. The bend relief was fully engaged and tested with an axial pull. The pump was turned into the appropriate position. The heart and pump were returned to the pericardium and appeared to sit nicely.   Aortic outflow graft anastomosis: Assisted by Laurel Becket, PA-C  The outflow graft was distended and positioned along the inferior margin of the heart and around the right atrium. The outflow graft was cut to the appropriate length. The ascending aorta was partially occluded with a Lemole clamp. A slightly diagonall midline aortotomy was created and the ends enlarged with a 4.5 mm punch. The outflow graft was anastomosed to the aorta using 4-0 prolene pledgetted sutures at the toe and heal that were run down each side. Prevaleak was used to seal the needle holes in the graft. The Lemole clamp was removed and the graft clamped with a peripheral Debakey clamp and deaired using a 21 gauge needle. The aortic anastomosis was hemostatic.   Weaning from bypass:  The patient was started on Milrinone  0.375 mcg/kg/min, epinephrine  4 mcg/kg/min, levophed  at 4 mcg/kg/min and nitric oxide at 20 ppm. The HeartMate 3 pump was started at 3000 rpm. TEE confirmed that the intracardiac air  was removed.  Cardiopulmonary bypass flow was decreased to half flow and the clamp removed from the outflow graft. The speed was gradually increased to 4000 rpm. Flow was decreased to 1/4 and the speed gradually increased to 4800 rpm. Cardiopulmonary bypass was discontinued and the speed increased to 5100 rpm.  Pulmonary artery pressures were much lower than preop with a CVP of 5 CPB time was 116 minutes. TEE showed excellent apical  cannula position. There was no MR, no TR. RV function appeared fair.  Completion:  Two temporary epicardial pacing wires were placed on the right atrium and a bipolar lead on the inferior wall.  Heparin  was fully reversed with protamine  and the aortic and venous cannulas removed. The plt count was 62 K and 2 units of platelets were given. Fibrinogen was 326. He was given 4 units of FFP during the procedure. Hemostasis was achieved. Mediastinal drainage tubes and a left pleural chest tube was placed.  A 28 Blake pocket drain was placed. The sternum was closed with double #6 stainless steel wires. The fascia was closed with continuous # 1 vicryl suture. The subcutaneous tissue was closed with 2-0 vicryl continuous suture. The skin was closed with 3-0 vicryl subcuticular suture. The abdominal incision was closed with continuous 3-0 vicryl subcutaneous suture and 3-0 vicryl subcuticular skin suture.  The two drive line fasteners were applied. All sponge, needle, and instrument counts were reported correct at the end of the case. Dry sterile dressings were placed over the incisions and around the chest tubes which were connected to pleurevac suction. The patient was then transported to the surgical intensive care unit in critical but stable condition.

## 2024-10-14 NOTE — Consult Note (Signed)
 "  NAME:  James [name], MRN:  [id number], DOB:  April 20, 1961, LOS: 13 ADMISSION DATE:  10/01/2024, CONSULTATION DATE:  10/14/24 REFERRING MD:  Lucas , CHIEF COMPLAINT:  s/p LVAD   History of Present Illness:  63 yo M with stg D cardiomyopathy + inotrope dependence, CKD 3a, hx VT, pAFib, meningioma who was admitted to HF service 10/01/24 for pre-VAD optimization  This included: RHC, milrinone  titration, impella placement, levo and DBA titration and volume optimization   On 10/14/24 he went for LVAD  Pump: 116 min  EBL: 750 Product: 2 plt 4 ffp 708 cellsaver   PCCM is consulted post op in this setting    Pertinent  Medical History  HFrEF Inotrope dependence VT pAFib  Prostate cancer Meningioma  HTN DM2   Significant Hospital Events: Including procedures, antibiotic start and stop dates in addition to other pertinent events    10/01/24 RHC w low output and pHTN, and subsequent uptitration of milrinone  from 0.25 to 0.5 10/02/24 started ND 10/05/24 high PA pressures, hignPCWP high SVR. 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 Hep to bival 10/13/24 not likely HIT. Milrinone  to 0.375  10/14/24 LVAD. PCCM consult   Interim History / Subjective:   POD 0 impella removal, LVAD HM3  implant   Objective    Blood pressure 114/88, pulse (!) 52, temperature 98.1 F (36.7 C), resp. rate 20, height 6' 6 (1.981 m), weight 99 kg, SpO2 96%. PAP: (51-90)/(13-45) 75/27 CVP:  [1 mmHg-13 mmHg] 7 mmHg PCWP:  [4 mmHg] 4 mmHg CO:  [4.4 L/min-5.5 L/min] 4.7 L/min CI:  [1.88 L/min/m2-2.3 L/min/m2] 2 L/min/m2      Intake/Output Summary (Last 24 hours) at 10/14/2024 1002 Last data filed at 10/14/2024 0939 Gross per 24 hour  Intake 1893.8 ml  Output 1480 ml  Net 413.8 ml   Filed Weights   10/12/24 0703 10/13/24 0545 10/14/24 0610  Weight: 95.7 kg 96.5 kg 99  kg    Examination: General: chronically and critically ill middle aged M intubated sedated  HENT: eyes are taped. Swan in place. ETT secure  Lungs: CTAb mechanically ventilated  Cardiovascular: LVAD hum cap refill < 3 sec. Chest tube, JP w thin bloody output.   Abdomen: soft, + bowel sounds Extremities: no lower extremity edema  Neuro: sedate GU: foley   Resolved problem list   Assessment and Plan   HFrEF, stg D cardiomyopathy s/p LVAD (HM3 10/14/24)  pHTN, RV dysfxn   PVCs, NSVT  pAFib  P -post op per CVTS -vasoactive meds per adv HF  -post op labs  -likely needs more volume -- consider plt pending post op labs / if ongoing losses  -post op labs  -ppx antimicrobials  -complete txa  -amio  -insulin  gtt -iNO  -optimize lytes   Endotracheally intubated  P -CXR ABG -returns from OR on iNO -- plan to remain intubated 12/31 and leave iNO as is & work to extubate 1/1 on iNO then slowly wean  -VAP, pulm hygiene -sedation: precedex fent, prop if needed   CKD 3a Hyponatremia  P -follow renal indicues, UOP   DM2 w hyperglycemia  P -post op insulin  gtt   Anemia  Thrombocytopenia -unlikely HIT  P -post op labs  -ddavp  -possible add'l plt if needed   Labs   CBC: Recent Labs  Lab 10/10/24 0508 10/11/24 0227 10/12/24 0533 10/13/24 0515 10/14/24 0410 10/14/24  9350 10/14/24 0837 10/14/24 0926  WBC 6.8 8.2 8.2 8.0 6.9  --   --   --   HGB 13.9 13.7 13.0 12.1* 11.3* 11.6* 11.6* 11.6*  HCT 38.7* 38.4* 37.3* 34.5* 32.0* 34.0* 34.0* 34.0*  MCV 86.8 87.5 90.1 89.4 88.9  --   --   --   PLT 64* 61* 62* 62* 66*  --   --   --     Basic Metabolic Panel: Recent Labs  Lab 10/10/24 0508 10/10/24 1237 10/10/24 1853 10/11/24 0227 10/12/24 0533 10/13/24 0514 10/13/24 0515 10/14/24 0410 10/14/24 0649 10/14/24 0837 10/14/24 0926  NA 122* 122*   < > 127* 132*  --  128* 129* 130* 132* 128*  K 3.3* 3.5  --  3.4* 4.1  --  3.8 3.7 4.2 4.2 4.2  CL 87* 85*  --  93*  97*  --  95* 95*  --  93* 95*  CO2 25 25  --  25 25  --  24 25  --   --   --   GLUCOSE 119* 200*  --  129* 127*  --  192* 115*  --  128* 186*  BUN 35* 34*  --  33* 28*  --  27* 27*  --  26* 26*  CREATININE 1.83* 1.94*  --  1.78* 1.65*  --  1.67* 1.60*  --  1.60* 1.60*  CALCIUM  8.6* 9.2  --  8.7* 9.0  --  8.8* 8.6*  --   --   --   MG 2.1  --   --  2.1 2.0  --  1.7 2.0  --   --   --   PHOS  --   --   --   --   --  1.9*  --   --   --   --   --    < > = values in this interval not displayed.   GFR: Estimated Creatinine Clearance: 61.1 mL/min (A) (by C-G formula based on SCr of 1.6 mg/dL (H)). Recent Labs  Lab 10/11/24 0227 10/12/24 0533 10/13/24 0515 10/14/24 0410  WBC 8.2 8.2 8.0 6.9    Liver Function Tests: Recent Labs  Lab 10/10/24 1237  AST 42*  ALT 20  ALKPHOS 119  BILITOT 1.5*  PROT 6.5  ALBUMIN  3.7   No results for input(s): LIPASE, AMYLASE in the last 168 hours. No results for input(s): AMMONIA in the last 168 hours.  ABG    Component Value Date/Time   PHART 7.465 (H) 10/14/2024 0649   PCO2ART 32.6 10/14/2024 0649   PO2ART 85 10/14/2024 0649   HCO3 23.5 10/14/2024 0649   TCO2 22 10/14/2024 0926   ACIDBASEDEF 1.0 12/21/2022 1037   ACIDBASEDEF 1.0 12/21/2022 1037   O2SAT 97 10/14/2024 0649     Coagulation Profile: No results for input(s): INR, PROTIME in the last 168 hours.  Cardiac Enzymes: No results for input(s): CKTOTAL, CKMB, CKMBINDEX, TROPONINI in the last 168 hours.  HbA1C: HB A1C (BAYER DCA - WAIVED)  Date/Time Value Ref Range Status  06/13/2023 12:00 AM 6.3 (H) 4.8 - 5.6 % Final    Comment:             Prediabetes: 5.7 - 6.4          Diabetes: >6.4          Glycemic control for adults with diabetes: <7.0    Hgb A1c MFr Bld  Date/Time Value Ref Range Status  07/10/2024 04:50  AM 6.0 (H) 4.8 - 5.6 % Final    Comment:    (NOTE) Diagnosis of Diabetes The following HbA1c ranges recommended by the American Diabetes  Association (ADA) may be used as an aid in the diagnosis of diabetes mellitus.  Hemoglobin             Suggested A1C NGSP%              Diagnosis  <5.7                   Non Diabetic  5.7-6.4                Pre-Diabetic  >6.4                   Diabetic  <7.0                   Glycemic control for                       adults with diabetes.    03/24/2024 02:18 PM 6.1 (H) 4.8 - 5.6 % Final    Comment:             Prediabetes: 5.7 - 6.4          Diabetes: >6.4          Glycemic control for adults with diabetes: <7.0     CBG: Recent Labs  Lab 10/12/24 2106 10/13/24 0641 10/13/24 1104 10/13/24 1639 10/13/24 2109  GLUCAP 116* 192* 148* 70 149*    Review of Systems:   Unable to obtain intubated sedated   Past Medical History:  He,  has a past medical history of CKD (chronic kidney disease) stage 2, GFR 60-89 ml/min, COPD (chronic obstructive pulmonary disease) (HCC), Diabetes mellitus type II, non insulin  dependent (HCC), Dilated aortic root, Elevated PSA, Hypercholesteremia, Hypertension, NICM (nonischemic cardiomyopathy) (HCC), NSVT (nonsustained ventricular tachycardia) (HCC), Obstructive sleep apnea, Paroxysmal atrial fibrillation (HCC), Pulmonary hypertension (HCC), PVC's (premature ventricular contractions), and Tobacco abuse.   Surgical History:   Past Surgical History:  Procedure Laterality Date   CARDIAC CATHETERIZATION  10/15/2000   COLONOSCOPY N/A 03/22/2014   Procedure: COLONOSCOPY;  Surgeon: Margo LITTIE Haddock, MD;  Location: [facility] SUITE;  Service: Endoscopy;  Laterality: N/A;  11:15 AM   ICD IMPLANT N/A 08/21/2021   Procedure: ICD IMPLANT;  Surgeon: Waddell Danelle ORN, MD;  Location: [facility] INVASIVE CV LAB;  Service: Cardiovascular;  Laterality: N/A;   INGUINAL HERNIA REPAIR Right 06/24/2017   Procedure: HERNIA REPAIR INGUINAL ADULT WITH MESH;  Surgeon: Mavis Anes, MD;  Location: [facility] ORS;  Service: General;  Laterality: Right;   INTRAOPERATIVE TRANSESOPHAGEAL  ECHOCARDIOGRAM N/A 10/06/2024   Procedure: ECHOCARDIOGRAM, TRANSESOPHAGEAL, INTRAOPERATIVE;  Surgeon: Daniel Con RAMAN, MD;  Location: [facility] OR;  Service: Open Heart Surgery;  Laterality: N/A;   IR TUNNELED CENTRAL VENOUS CATH PLC W IMG  07/17/2024   PLACEMENT OF IMPELLA LEFT VENTRICULAR ASSIST DEVICE N/A 10/06/2024   Procedure: INSERTION, CARDIAC ASSIST DEVICE, IMPELLA 5.5;  Surgeon: Daniel Con RAMAN, MD;  Location: [facility] OR;  Service: Open Heart Surgery;  Laterality: N/A;   PROSTATE BIOPSY     RIGHT HEART CATH N/A 11/13/2022   Procedure: RIGHT HEART CATH;  Surgeon: Gardenia Led, DO;  Location: [facility] INVASIVE CV LAB;  Service: Cardiovascular;  Laterality: N/A;   RIGHT HEART CATH N/A 12/21/2022   Procedure: RIGHT HEART CATH;  Surgeon: Gardenia Led, DO;  Location: [facility] INVASIVE  CV LAB;  Service: Cardiovascular;  Laterality: N/A;   RIGHT HEART CATH N/A 11/25/2023   Procedure: RIGHT HEART CATH;  Surgeon: Gardenia Led, DO;  Location: [facility] INVASIVE CV LAB;  Service: Cardiovascular;  Laterality: N/A;   RIGHT HEART CATH N/A 10/01/2024   Procedure: RIGHT HEART CATH;  Surgeon: Rolan Ezra RAMAN, MD;  Location: [facility] INVASIVE CV LAB;  Service: Cardiovascular;  Laterality: N/A;   RIGHT HEART CATH N/A 10/07/2024   Procedure: RIGHT HEART CATH;  Surgeon: Cherrie Toribio SAUNDERS, MD;  Location: [facility] INVASIVE CV LAB;  Service: Cardiovascular;  Laterality: N/A;   RIGHT/LEFT HEART CATH AND CORONARY ANGIOGRAPHY N/A 07/19/2021   Procedure: RIGHT/LEFT HEART CATH AND CORONARY ANGIOGRAPHY;  Surgeon: Verlin Lonni BIRCH, MD;  Location: [facility] INVASIVE CV LAB;  Service: Cardiovascular;  Laterality: N/A;   TEE WITHOUT CARDIOVERSION N/A 12/21/2022   Procedure: TRANSESOPHAGEAL ECHOCARDIOGRAM (TEE);  Surgeon: Gardenia Led, DO;  Location: [facility] ENDOSCOPY;  Service: Cardiovascular;  Laterality: N/A;     Social History:   reports that he quit smoking about 3 years ago. His smoking use included cigarettes. He started smoking about 42 years  ago. He has a 19.4 pack-year smoking history. He has never used smokeless tobacco. He reports that he does not drink alcohol and does not use drugs.   Family History:  His family history includes Cardiomyopathy in his brother; Coronary artery disease in his mother; Diabetes in his father; Heart attack in his father; Heart disease in his maternal grandfather; Hypertension in his father; Iron deficiency in his father; Sudden death in an other family member; Thyroid  disease in his sister. There is no history of Colon cancer.   Allergies Allergies[1]   Home Medications  Prior to Admission medications  Medication Sig Start Date End Date Taking? Authorizing Provider  acetaminophen  (TYLENOL ) 500 MG tablet Take 1,000 mg by mouth every 6 (six) hours as needed for moderate pain.   Yes [provider]  albuterol  (VENTOLIN  HFA) 108 (90 Base) MCG/ACT inhaler INHALE 2 PUFFS BY MOUTH FOUR TIMES DAILY AS NEEDED Patient taking differently: Inhale 1 puff into the lungs every 6 (six) hours as needed for shortness of breath. 08/21/23  Yes Melvenia Manus BRAVO, MD  amiodarone  (PACERONE ) 200 MG tablet Take 1 tablet (200 mg total) by mouth daily. 08/12/24  Yes Hayes Beckey CROME, NP  apixaban  (ELIQUIS ) 5 MG TABS tablet Take 1 tablet (5 mg total) by mouth 2 (two) times daily. 12/13/23  Yes Waddell Danelle ORN, MD  atorvastatin  (LIPITOR) 40 MG tablet Take 1 tablet (40 mg total) by mouth daily. 03/24/24  Yes Melvenia Manus BRAVO, MD  digoxin  (LANOXIN ) 0.125 MG tablet Take 1 tablet (0.125 mg total) by mouth daily. 08/12/24  Yes Diaz, Alma L, NP  FARXIGA  10 MG TABS tablet TAKE 1 TABLET(10 MG) BY MOUTH DAILY BEFORE BREAKFAST 03/30/24  Yes Clegg, Amy D, NP  hydrALAZINE  (APRESOLINE ) 50 MG tablet Take 1 tablet (50 mg total) by mouth every 8 (eight) hours. 08/12/24  Yes Hayes Beckey CROME, NP  metFORMIN (GLUCOPHAGE) 500 MG tablet TAKE 1 TABLET BY MOUTH TWICE DAILY 05/04/24  Yes Bevely Doffing, FNP  mexiletine (MEXITIL ) 200 MG capsule Take 1  capsule (200 mg total) by mouth every 8 (eight) hours. 08/12/24  Yes Hayes Beckey CROME, NP  milrinone  (PRIMACOR ) 20 MG/100 ML SOLN infusion Inject 0.0294 mg/min into the vein continuous. 07/17/24  Yes Simmons, Brittainy M, PA-C  ondansetron  (ZOFRAN ) 4 MG tablet Take 1 tablet (4 mg total) by mouth every 8 (eight)  hours as needed for nausea or vomiting. 09/15/24  Yes Zenaida Morene PARAS, MD  potassium chloride  SA (KLOR-CON  M) 20 MEQ tablet Take 1 tablet (20 mEq total) by mouth daily. 08/12/24  Yes Hayes Beckey CROME, NP  sacubitril -valsartan  (ENTRESTO ) 24-26 MG Take 1 tablet by mouth 2 (two) times daily. 07/23/24  Yes Colletta Manuelita Garre, PA-C  spironolactone  (ALDACTONE ) 25 MG tablet Take 1 tablet (25 mg total) by mouth at bedtime. 01/07/24  Yes Milford, Harlene HERO, FNP  torsemide  (DEMADEX ) 20 MG tablet Take 1 tablet (20 mg total) by mouth daily. 08/12/24  Yes Hayes Beckey CROME, NP     Critical care time: 38 min      CRITICAL CARE Performed by: Ronnald FORBES Gave   Total critical care time: 38 minutes  Critical care time was exclusive of separately billable procedures and treating other patients. Critical care was necessary to treat or prevent imminent or life-threatening deterioration.  Critical care was time spent personally by me on the following activities: development of treatment plan with patient and/or surrogate as well as nursing, discussions with consultants, evaluation of patient's response to treatment, examination of patient, obtaining history from patient or surrogate, ordering and performing treatments and interventions, ordering and review of laboratory studies, ordering and review of radiographic studies, pulse oximetry and re-evaluation of patient's condition.  Ronnald Gave MSN, AGACNP-BC [facility] Amion for pager  10/14/2024, 2:26 PM          [1] No Known Allergies  "

## 2024-10-14 NOTE — Progress Notes (Signed)
 RT NOTE: RT X 2 transported PT on ventilator and iNO from OR to room 2H07 with no complications.

## 2024-10-14 NOTE — Progress Notes (Signed)
 EVENING ROUNDS NOTE :     [address] [place] [address]             [phone]               * Day of Surgery * Procedures (LRB): INSERTION OF IMPLANTABLE LEFT VENTRICULAR ASSIST DEVICE HEARTMATE 3; REMOVAL OF IMPELLA 5.5 (N/A) ECHOCARDIOGRAM, TRANSESOPHAGEAL, INTRAOPERATIVE (N/A)   Total Length of Stay:  LOS: 13 days  Events:   No event Watching CT output     BP 114/88   Pulse 80   Temp (!) 97.2 F (36.2 C)   Resp 18   Ht 6' 6 (1.981 m)   Wt 99 kg   SpO2 100%   BMI 25.22 kg/m   PAP: (30-78)/(14-37) 43/17 CVP:  [1 mmHg-13 mmHg] 3 mmHg PCWP:  [4 mmHg] 4 mmHg CO:  [4.7 L/min-7.5 L/min] 7.1 L/min CI:  [2 L/min/m2-3.2 L/min/m2] 3.01 L/min/m2  Vent Mode: SIMV;PRVC;PSV FiO2 (%):  [50 %-100 %] 50 % Set Rate:  [16 bmp-18 bmp] 18 bmp Vt Set:  [730 mL] 730 mL PEEP:  [5 cmH20] 5 cmH20 Pressure Support:  [10 cmH20] 10 cmH20   sodium chloride      [START ON 10/15/2024] sodium chloride      sodium chloride  10 mL/hr at 10/14/24 1700   albumin  human 60 mL/hr at 10/14/24 1700   amiodarone  30 mg/hr (10/14/24 1700)    ceFAZolin  (ANCEF ) IV     dexmedetomidine (PRECEDEX) IV infusion 0.7 mcg/kg/hr (10/14/24 1700)   epinephrine  6 mcg/min (10/14/24 1700)   famotidine (PEPCID) IV Stopped (10/14/24 1554)   fentaNYL  infusion INTRAVENOUS 50 mcg/hr (10/14/24 1700)   [START ON 10/15/2024] fluconazole (DIFLUCAN) IV     insulin  1.3 Units/hr (10/14/24 1700)   lactated ringers      lactated ringers      lactated ringers  20 mL/hr at 10/14/24 1700   magnesium  sulfate 20 mL/hr at 10/14/24 1700   milrinone  0.375 mcg/kg/min (10/14/24 1700)   norepinephrine  (LEVOPHED ) Adult infusion 11 mcg/min (10/14/24 1700)   potassium chloride  50 mL/hr at 10/14/24 1700   propofol  (DIPRIVAN ) infusion 10 mcg/kg/min (10/14/24 1700)   [START ON 10/15/2024] rifampin (RIFADIN) 600 mg in sodium chloride  0.9 % 100 mL IVPB     vancomycin      I/O last 3 completed shifts: In: 2167.2  [P.O.:480; I.V.:1245.7; Other:391.5; IV Piggyback:50] Out: 2635 [Urine:2635]      Latest Ref Rng & Units 10/14/2024    2:24 PM 10/14/2024    2:19 PM 10/14/2024   12:07 PM  CBC  WBC 4.0 - 10.5 K/uL 18.2     Hemoglobin 13.0 - 17.0 g/dL 8.7  9.5  8.8   Hematocrit 39.0 - 52.0 % 25.6  28.0  26.0   Platelets 150 - 400 K/uL 150 - 400 K/uL 77    79          Latest Ref Rng & Units 10/14/2024    2:24 PM 10/14/2024    2:19 PM 10/14/2024   12:07 PM  BMP  Glucose 70 - 99 mg/dL 72   845   BUN 8 - 23 mg/dL 23   25   Creatinine 9.38 - 1.24 mg/dL 8.49   8.49   Sodium 864 - 145 mmol/L 132  134  133   Potassium 3.5 - 5.1 mmol/L 3.7  3.6  3.4   Chloride 98 - 111 mmol/L 98   93   CO2 22 - 32 mmol/L 26  Calcium  8.9 - 10.3 mg/dL 9.3       ABG    Component Value Date/Time   PHART 7.325 (L) 10/14/2024 1419   PCO2ART 46.7 10/14/2024 1419   PO2ART 219 (H) 10/14/2024 1419   HCO3 24.4 10/14/2024 1419   TCO2 26 10/14/2024 1419   ACIDBASEDEF 2.0 10/14/2024 1419   O2SAT 74.7 10/14/2024 1437       James Rayas, MD 10/14/2024 5:19 PM

## 2024-10-14 NOTE — Anesthesia Procedure Notes (Signed)
 Procedure Name: Intubation Date/Time: 10/14/2024 8:33 AM  Performed by: Chaney Ozell CROME, CRNAPre-anesthesia Checklist: Patient identified, Emergency Drugs available, Suction available and Patient being monitored Patient Re-evaluated:Patient Re-evaluated prior to induction Oxygen  Delivery Method: Circle System Utilized Preoxygenation: Pre-oxygenation with 100% oxygen  Induction Type: IV induction Ventilation: Mask ventilation without difficulty Laryngoscope Size: Mac and 4 Grade View: Grade III Tube type: Oral Tube size: 8.0 mm Number of attempts: 1 Airway Equipment and Method: Stylet and Oral airway Placement Confirmation: ETT inserted through vocal cords under direct vision, positive ETCO2 and breath sounds checked- equal and bilateral Secured at: 24 cm Tube secured with: Tape Dental Injury: Teeth and Oropharynx as per pre-operative assessment

## 2024-10-14 NOTE — Plan of Care (Signed)
  Problem: Education: Goal: Understanding of CV disease, CV risk reduction, and recovery process will improve Outcome: Progressing   Problem: Activity: Goal: Ability to return to baseline activity level will improve Outcome: Progressing   Problem: Education: Goal: Knowledge of General Education information will improve Description: Including pain rating scale, medication(s)/side effects and non-pharmacologic comfort measures Outcome: Progressing   Problem: Health Behavior/Discharge Planning: Goal: Ability to manage health-related needs will improve Outcome: Progressing

## 2024-10-15 ENCOUNTER — Inpatient Hospital Stay (HOSPITAL_COMMUNITY)

## 2024-10-15 DIAGNOSIS — D649 Anemia, unspecified: Secondary | ICD-10-CM | POA: Diagnosis not present

## 2024-10-15 DIAGNOSIS — R570 Cardiogenic shock: Secondary | ICD-10-CM | POA: Diagnosis not present

## 2024-10-15 DIAGNOSIS — I472 Ventricular tachycardia, unspecified: Secondary | ICD-10-CM | POA: Diagnosis not present

## 2024-10-15 DIAGNOSIS — J9620 Acute and chronic respiratory failure, unspecified whether with hypoxia or hypercapnia: Secondary | ICD-10-CM

## 2024-10-15 DIAGNOSIS — Z95811 Presence of heart assist device: Secondary | ICD-10-CM | POA: Diagnosis not present

## 2024-10-15 DIAGNOSIS — D696 Thrombocytopenia, unspecified: Secondary | ICD-10-CM | POA: Diagnosis not present

## 2024-10-15 DIAGNOSIS — E1165 Type 2 diabetes mellitus with hyperglycemia: Secondary | ICD-10-CM | POA: Diagnosis not present

## 2024-10-15 DIAGNOSIS — Z9911 Dependence on respirator [ventilator] status: Secondary | ICD-10-CM | POA: Diagnosis not present

## 2024-10-15 DIAGNOSIS — E871 Hypo-osmolality and hyponatremia: Secondary | ICD-10-CM | POA: Diagnosis not present

## 2024-10-15 DIAGNOSIS — E1122 Type 2 diabetes mellitus with diabetic chronic kidney disease: Secondary | ICD-10-CM | POA: Diagnosis not present

## 2024-10-15 DIAGNOSIS — I5023 Acute on chronic systolic (congestive) heart failure: Secondary | ICD-10-CM | POA: Diagnosis not present

## 2024-10-15 DIAGNOSIS — I480 Paroxysmal atrial fibrillation: Secondary | ICD-10-CM | POA: Diagnosis not present

## 2024-10-15 DIAGNOSIS — I272 Pulmonary hypertension, unspecified: Secondary | ICD-10-CM | POA: Diagnosis not present

## 2024-10-15 DIAGNOSIS — N1831 Chronic kidney disease, stage 3a: Secondary | ICD-10-CM | POA: Diagnosis not present

## 2024-10-15 LAB — POCT I-STAT 7, (LYTES, BLD GAS, ICA,H+H)
Acid-Base Excess: 0 mmol/L (ref 0.0–2.0)
Acid-base deficit: 3 mmol/L — ABNORMAL HIGH (ref 0.0–2.0)
Acid-base deficit: 3 mmol/L — ABNORMAL HIGH (ref 0.0–2.0)
Bicarbonate: 21.4 mmol/L (ref 20.0–28.0)
Bicarbonate: 22.1 mmol/L (ref 20.0–28.0)
Bicarbonate: 21.8 mmol/L (ref 20.0–28.0)
Calcium, Ion: 1.28 mmol/L (ref 1.15–1.40)
Calcium, Ion: 1.24 mmol/L (ref 1.15–1.40)
Calcium, Ion: 1.18 mmol/L (ref 1.15–1.40)
HCT: 27 % — ABNORMAL LOW (ref 39.0–52.0)
HCT: 22 % — ABNORMAL LOW (ref 39.0–52.0)
HCT: 27 % — ABNORMAL LOW (ref 39.0–52.0)
Hemoglobin: 9.2 g/dL — ABNORMAL LOW (ref 13.0–17.0)
Hemoglobin: 7.5 g/dL — ABNORMAL LOW (ref 13.0–17.0)
Hemoglobin: 9.2 g/dL — ABNORMAL LOW (ref 13.0–17.0)
O2 Saturation: 100 %
O2 Saturation: 100 %
O2 Saturation: 95 %
Patient temperature: 36.2
Patient temperature: 37.2
Patient temperature: 37.4
Potassium: 3.8 mmol/L (ref 3.5–5.1)
Potassium: 3.6 mmol/L (ref 3.5–5.1)
Potassium: 3.6 mmol/L (ref 3.5–5.1)
Sodium: 133 mmol/L — ABNORMAL LOW (ref 135–145)
Sodium: 133 mmol/L — ABNORMAL LOW (ref 135–145)
Sodium: 130 mmol/L — ABNORMAL LOW (ref 135–145)
TCO2: 22 mmol/L (ref 22–32)
TCO2: 23 mmol/L (ref 22–32)
TCO2: 23 mmol/L (ref 22–32)
pCO2 arterial: 32.9 mmHg (ref 32–48)
pCO2 arterial: 27.4 mmHg — ABNORMAL LOW (ref 32–48)
pCO2 arterial: 36.5 mmHg (ref 32–48)
pH, Arterial: 7.418 (ref 7.35–7.45)
pH, Arterial: 7.516 — ABNORMAL HIGH (ref 7.35–7.45)
pH, Arterial: 7.386 (ref 7.35–7.45)
pO2, Arterial: 222 mmHg — ABNORMAL HIGH (ref 83–108)
pO2, Arterial: 235 mmHg — ABNORMAL HIGH (ref 83–108)
pO2, Arterial: 77 mmHg — ABNORMAL LOW (ref 83–108)

## 2024-10-15 LAB — GLUCOSE, CAPILLARY
Glucose-Capillary: 122 mg/dL — ABNORMAL HIGH (ref 70–99)
Glucose-Capillary: 124 mg/dL — ABNORMAL HIGH (ref 70–99)
Glucose-Capillary: 125 mg/dL — ABNORMAL HIGH (ref 70–99)
Glucose-Capillary: 133 mg/dL — ABNORMAL HIGH (ref 70–99)
Glucose-Capillary: 133 mg/dL — ABNORMAL HIGH (ref 70–99)
Glucose-Capillary: 136 mg/dL — ABNORMAL HIGH (ref 70–99)
Glucose-Capillary: 136 mg/dL — ABNORMAL HIGH (ref 70–99)
Glucose-Capillary: 138 mg/dL — ABNORMAL HIGH (ref 70–99)
Glucose-Capillary: 143 mg/dL — ABNORMAL HIGH (ref 70–99)
Glucose-Capillary: 155 mg/dL — ABNORMAL HIGH (ref 70–99)
Glucose-Capillary: 177 mg/dL — ABNORMAL HIGH (ref 70–99)
Glucose-Capillary: 182 mg/dL — ABNORMAL HIGH (ref 70–99)
Glucose-Capillary: 89 mg/dL (ref 70–99)

## 2024-10-15 LAB — COMPREHENSIVE METABOLIC PANEL WITH GFR
ALT: 16 U/L (ref 0–44)
AST: 82 U/L — ABNORMAL HIGH (ref 15–41)
Albumin: 3.4 g/dL — ABNORMAL LOW (ref 3.5–5.0)
Alkaline Phosphatase: 90 U/L (ref 38–126)
Anion gap: 12 (ref 5–15)
BUN: 24 mg/dL — ABNORMAL HIGH (ref 8–23)
CO2: 22 mmol/L (ref 22–32)
Calcium: 9.2 mg/dL (ref 8.9–10.3)
Chloride: 99 mmol/L (ref 98–111)
Creatinine, Ser: 1.55 mg/dL — ABNORMAL HIGH (ref 0.61–1.24)
GFR, Estimated: 50 mL/min — ABNORMAL LOW
Glucose, Bld: 119 mg/dL — ABNORMAL HIGH (ref 70–99)
Potassium: 4 mmol/L (ref 3.5–5.1)
Sodium: 133 mmol/L — ABNORMAL LOW (ref 135–145)
Total Bilirubin: 1.3 mg/dL — ABNORMAL HIGH (ref 0.0–1.2)
Total Protein: 5.3 g/dL — ABNORMAL LOW (ref 6.5–8.1)

## 2024-10-15 LAB — BASIC METABOLIC PANEL WITH GFR
Anion gap: 14 (ref 5–15)
BUN: 22 mg/dL (ref 8–23)
CO2: 21 mmol/L — ABNORMAL LOW (ref 22–32)
Calcium: 8.8 mg/dL — ABNORMAL LOW (ref 8.9–10.3)
Chloride: 95 mmol/L — ABNORMAL LOW (ref 98–111)
Creatinine, Ser: 1.59 mg/dL — ABNORMAL HIGH (ref 0.61–1.24)
GFR, Estimated: 48 mL/min — ABNORMAL LOW
Glucose, Bld: 129 mg/dL — ABNORMAL HIGH (ref 70–99)
Potassium: 3.7 mmol/L (ref 3.5–5.1)
Sodium: 130 mmol/L — ABNORMAL LOW (ref 135–145)

## 2024-10-15 LAB — PREPARE FRESH FROZEN PLASMA: Unit division: 0

## 2024-10-15 LAB — PREPARE PLATELET PHERESIS
Unit division: 0
Unit division: 0
Unit division: 0

## 2024-10-15 LAB — CBC WITH DIFFERENTIAL/PLATELET
Abs Immature Granulocytes: 0.1 K/uL — ABNORMAL HIGH (ref 0.00–0.07)
Basophils Absolute: 0 K/uL (ref 0.0–0.1)
Basophils Relative: 0 %
Eosinophils Absolute: 0 K/uL (ref 0.0–0.5)
Eosinophils Relative: 0 %
HCT: 22.7 % — ABNORMAL LOW (ref 39.0–52.0)
Hemoglobin: 7.8 g/dL — ABNORMAL LOW (ref 13.0–17.0)
Immature Granulocytes: 1 %
Lymphocytes Relative: 3 %
Lymphs Abs: 0.3 K/uL — ABNORMAL LOW (ref 0.7–4.0)
MCH: 31.7 pg (ref 26.0–34.0)
MCHC: 34.4 g/dL (ref 30.0–36.0)
MCV: 92.3 fL (ref 80.0–100.0)
Monocytes Absolute: 1.1 K/uL — ABNORMAL HIGH (ref 0.1–1.0)
Monocytes Relative: 8 %
Neutro Abs: 11.6 K/uL — ABNORMAL HIGH (ref 1.7–7.7)
Neutrophils Relative %: 88 %
Platelets: 94 K/uL — ABNORMAL LOW (ref 150–400)
RBC: 2.46 MIL/uL — ABNORMAL LOW (ref 4.22–5.81)
RDW: 16.6 % — ABNORMAL HIGH (ref 11.5–15.5)
WBC: 13.1 K/uL — ABNORMAL HIGH (ref 4.0–10.5)
nRBC: 0 % (ref 0.0–0.2)

## 2024-10-15 LAB — CBC
HCT: 24.9 % — ABNORMAL LOW (ref 39.0–52.0)
Hemoglobin: 8.6 g/dL — ABNORMAL LOW (ref 13.0–17.0)
MCH: 31.3 pg (ref 26.0–34.0)
MCHC: 34.5 g/dL (ref 30.0–36.0)
MCV: 90.5 fL (ref 80.0–100.0)
Platelets: 95 K/uL — ABNORMAL LOW (ref 150–400)
RBC: 2.75 MIL/uL — ABNORMAL LOW (ref 4.22–5.81)
RDW: 17 % — ABNORMAL HIGH (ref 11.5–15.5)
WBC: 15.7 K/uL — ABNORMAL HIGH (ref 4.0–10.5)
nRBC: 0 % (ref 0.0–0.2)

## 2024-10-15 LAB — BPAM PLATELET PHERESIS
Blood Product Expiration Date: 202601012359
Blood Product Expiration Date: 202601032359
Blood Product Expiration Date: 202601032359
ISSUE DATE / TIME: 202512311049
ISSUE DATE / TIME: 202512311049
ISSUE DATE / TIME: 202512311735
Unit Type and Rh: 5100
Unit Type and Rh: 7300
Unit Type and Rh: 6200

## 2024-10-15 LAB — PREPARE RBC (CROSSMATCH)

## 2024-10-15 LAB — COOXEMETRY PANEL
Carboxyhemoglobin: 1.5 % (ref 0.5–1.5)
Methemoglobin: <0.7 % (ref 0.0–1.5)
O2 Saturation: 75.9 %
Total hemoglobin: 7.8 g/dL — ABNORMAL LOW (ref 12.0–16.0)

## 2024-10-15 LAB — PROTIME-INR
INR: 1.2 (ref 0.8–1.2)
Prothrombin Time: 15.7 s — ABNORMAL HIGH (ref 11.4–15.2)

## 2024-10-15 LAB — BPAM FFP
Blood Product Expiration Date: 202512312359
Blood Product Expiration Date: 202601022359
Blood Product Expiration Date: 202601022359
Blood Product Expiration Date: 202601022359
ISSUE DATE / TIME: 202512310815
ISSUE DATE / TIME: 202512310815
ISSUE DATE / TIME: 202512310815
ISSUE DATE / TIME: 202512310815
Unit Type and Rh: 6200
Unit Type and Rh: 6200
Unit Type and Rh: 6200
Unit Type and Rh: 6200

## 2024-10-15 LAB — MAGNESIUM
Magnesium: 2.3 mg/dL (ref 1.7–2.4)
Magnesium: 1.9 mg/dL (ref 1.7–2.4)

## 2024-10-15 LAB — SEROTONIN RELEASE ASSAY (SRA)
SRA .2 IU/mL UFH Ser-aCnc: <1 % (ref 0–20)
SRA 100IU/mL UFH Ser-aCnc: <1 % (ref 0–20)

## 2024-10-15 LAB — PHOSPHORUS: Phosphorus: 3.3 mg/dL (ref 2.5–4.6)

## 2024-10-15 LAB — PRO BRAIN NATRIURETIC PEPTIDE: Pro Brain Natriuretic Peptide: 9508 pg/mL — ABNORMAL HIGH

## 2024-10-15 LAB — LACTATE DEHYDROGENASE: LDH: 575 U/L — ABNORMAL HIGH (ref 105–235)

## 2024-10-15 MED ORDER — MELATONIN 3 MG PO TABS: 3.0000 mg | ORAL_TABLET | Freq: Every day | ORAL | Status: DC

## 2024-10-15 MED ORDER — SODIUM CHLORIDE 0.9% IV SOLUTION: Freq: Once | INTRAVENOUS | Status: AC

## 2024-10-15 MED ORDER — MELATONIN 3 MG PO TABS
3.0000 mg | ORAL_TABLET | Freq: Every day | ORAL | Status: DC
  Administered 2024-10-16 – 2024-10-27 (×12): 3 mg via ORAL
  Filled 2024-10-15 (×12): qty 1

## 2024-10-15 MED ORDER — FUROSEMIDE 10 MG/ML IJ SOLN
80.0000 mg | Freq: Once | INTRAMUSCULAR | Status: AC
  Administered 2024-10-15: 80 mg via INTRAVENOUS
  Filled 2024-10-15: qty 8

## 2024-10-15 MED ORDER — ORAL CARE MOUTH RINSE: 15.0000 mL | OROMUCOSAL | Status: DC | PRN

## 2024-10-15 MED ORDER — TRAZODONE HCL 50 MG PO TABS
50.0000 mg | ORAL_TABLET | Freq: Every evening | ORAL | Status: DC | PRN
  Administered 2024-10-15: 50 mg via ORAL
  Filled 2024-10-15: qty 1

## 2024-10-15 MED ORDER — DOCUSATE SODIUM 100 MG PO CAPS
100.0000 mg | ORAL_CAPSULE | Freq: Every day | ORAL | Status: DC
  Administered 2024-10-16 – 2024-10-17 (×2): 100 mg via ORAL
  Filled 2024-10-15 (×2): qty 1

## 2024-10-15 MED ORDER — GABAPENTIN 100 MG PO CAPS
200.0000 mg | ORAL_CAPSULE | Freq: Every day | ORAL | Status: DC
  Administered 2024-10-15 – 2024-11-01 (×18): 200 mg via ORAL
  Filled 2024-10-15 (×19): qty 2

## 2024-10-15 MED ORDER — TRAZODONE HCL 50 MG PO TABS: 50.0000 mg | ORAL_TABLET | Freq: Every evening | ORAL | Status: DC | PRN

## 2024-10-15 MED ORDER — OXYCODONE HCL 5 MG PO TABS: 5.0000 mg | ORAL_TABLET | ORAL | Status: DC | PRN

## 2024-10-15 MED ORDER — INSULIN GLARGINE 100 UNIT/ML ~~LOC~~ SOLN
14.0000 [IU] | Freq: Every day | SUBCUTANEOUS | Status: DC
  Filled 2024-10-15: qty 0.14

## 2024-10-15 MED ORDER — MELATONIN 3 MG PO TABS
3.0000 mg | ORAL_TABLET | Freq: Every day | ORAL | Status: DC
  Administered 2024-10-15: 3 mg via ORAL
  Filled 2024-10-15: qty 1

## 2024-10-15 MED ORDER — INSULIN ASPART 100 UNIT/ML IJ SOLN
0.0000 [IU] | INTRAMUSCULAR | Status: DC
  Administered 2024-10-15: 4 [IU] via SUBCUTANEOUS
  Administered 2024-10-15: 2 [IU] via SUBCUTANEOUS
  Administered 2024-10-15: 4 [IU] via SUBCUTANEOUS
  Administered 2024-10-16: 2 [IU] via SUBCUTANEOUS
  Administered 2024-10-16: 4 [IU] via SUBCUTANEOUS
  Administered 2024-10-16: 8 [IU] via SUBCUTANEOUS
  Administered 2024-10-16 (×2): 4 [IU] via SUBCUTANEOUS
  Administered 2024-10-17 (×2): 2 [IU] via SUBCUTANEOUS
  Administered 2024-10-17: 8 [IU] via SUBCUTANEOUS
  Administered 2024-10-17 (×2): 2 [IU] via SUBCUTANEOUS
  Administered 2024-10-17: 8 [IU] via SUBCUTANEOUS
  Administered 2024-10-17 – 2024-10-18 (×2): 2 [IU] via SUBCUTANEOUS
  Filled 2024-10-15: qty 4
  Filled 2024-10-15 (×2): qty 2
  Filled 2024-10-15: qty 4
  Filled 2024-10-15: qty 8
  Filled 2024-10-15: qty 2
  Filled 2024-10-15 (×2): qty 4
  Filled 2024-10-15: qty 2
  Filled 2024-10-15 (×2): qty 4
  Filled 2024-10-15: qty 3
  Filled 2024-10-15: qty 2

## 2024-10-15 MED ORDER — TRAMADOL HCL 50 MG PO TABS
50.0000 mg | ORAL_TABLET | Freq: Two times a day (BID) | ORAL | Status: DC | PRN
  Administered 2024-10-15: 50 mg via ORAL
  Filled 2024-10-15: qty 1

## 2024-10-15 MED ORDER — PHENOL 1.4 % MT LIQD
1.0000 | OROMUCOSAL | Status: DC | PRN
  Administered 2024-10-27: 1 via OROMUCOSAL
  Filled 2024-10-15: qty 177

## 2024-10-15 MED ORDER — TRAMADOL HCL 50 MG PO TABS
50.0000 mg | ORAL_TABLET | Freq: Two times a day (BID) | ORAL | Status: DC | PRN
  Administered 2024-10-24 – 2024-10-26 (×3): 50 mg via ORAL
  Filled 2024-10-15 (×4): qty 1

## 2024-10-15 MED ORDER — POLYETHYLENE GLYCOL 3350 17 G PO PACK
17.0000 g | PACK | Freq: Every day | ORAL | Status: DC
  Administered 2024-10-16 – 2024-10-20 (×3): 17 g via ORAL
  Filled 2024-10-15 (×4): qty 1

## 2024-10-15 MED ORDER — DOCUSATE SODIUM 50 MG/5ML PO LIQD
200.0000 mg | Freq: Every day | ORAL | Status: DC
  Administered 2024-10-15: 200 mg
  Filled 2024-10-15: qty 20

## 2024-10-15 MED ORDER — TRAZODONE HCL 50 MG PO TABS
50.0000 mg | ORAL_TABLET | Freq: Every evening | ORAL | Status: DC | PRN
  Administered 2024-10-16 – 2024-10-28 (×3): 50 mg via ORAL
  Filled 2024-10-15 (×3): qty 1

## 2024-10-15 MED ORDER — POTASSIUM CHLORIDE 10 MEQ/50ML IV SOLN
10.0000 meq | INTRAVENOUS | Status: AC
  Administered 2024-10-15 (×3): 10 meq via INTRAVENOUS
  Filled 2024-10-15 (×2): qty 50

## 2024-10-15 MED ORDER — TRAMADOL HCL 50 MG PO TABS: 50.0000 mg | ORAL_TABLET | Freq: Two times a day (BID) | ORAL | Status: DC | PRN

## 2024-10-15 NOTE — Progress Notes (Signed)
 INO stopped per wean protocol.  Pt placed on 2L [place], VSS at this time RT will follow up as needed.

## 2024-10-15 NOTE — Progress Notes (Signed)
 EVENING ROUNDS NOTE :     [address] [place] [address]             [phone]               1 Day Post-Op Procedures (LRB): INSERTION OF IMPLANTABLE LEFT VENTRICULAR ASSIST DEVICE HEARTMATE 3; REMOVAL OF IMPELLA 5.5 (N/A) ECHOCARDIOGRAM, TRANSESOPHAGEAL, INTRAOPERATIVE (N/A)   Total Length of Stay:  LOS: 14 days  Events:   Extubated Making urine Coming down on chemical support    BP (!) 86/65   Pulse (!) 143   Temp 99.3 F (37.4 C)   Resp 14   Ht 6' 6 (1.981 m)   Wt 107.7 kg   SpO2 96%   BMI 27.44 kg/m   PAP: (24-65)/(9-37) 50/18 CVP:  [0 mmHg-31 mmHg] 6 mmHg CO:  [5.9 L/min-8.3 L/min] 5.9 L/min CI:  [2.49 L/min/m2-3.54 L/min/m2] 2.49 L/min/m2  Vent Mode: SIMV;PRVC;PSV FiO2 (%):  [36 %-50 %] 36 % Set Rate:  [18 bmp] 18 bmp Vt Set:  [730 mL] 730 mL PEEP:  [5 cmH20] 5 cmH20 Pressure Support:  [10 cmH20] 10 cmH20 Plateau Pressure:  [16 cmH20-18 cmH20] 18 cmH20   sodium chloride      amiodarone  30 mg/hr (10/15/24 1400)    ceFAZolin  (ANCEF ) IV 2 g (10/15/24 1404)   epinephrine  6 mcg/min (10/15/24 1425)   insulin  Stopped (10/15/24 1359)   milrinone  0.25 mcg/kg/min (10/15/24 1439)   norepinephrine  (LEVOPHED ) Adult infusion Stopped (10/15/24 1258)   vancomycin Stopped (10/15/24 1009)    I/O last 3 completed shifts: In: 9270.9 [I.V.:4439.6; Blood:2029; Other:136.1; NG/GT:110; IV Piggyback:2556.2] Out: 5595 [Urine:3230; Drains:55; Blood:1250; Chest Tube:1060]      Latest Ref Rng & Units 10/15/2024    5:57 AM 10/15/2024    4:00 AM 10/14/2024    8:05 PM  CBC  WBC 4.0 - 10.5 K/uL  13.1    Hemoglobin 13.0 - 17.0 g/dL 7.5  7.8  9.2   Hematocrit 39.0 - 52.0 % 22.0  22.7  27.0   Platelets 150 - 400 K/uL  94         Latest Ref Rng & Units 10/15/2024    5:57 AM 10/15/2024    4:00 AM 10/14/2024    8:05 PM  BMP  Glucose 70 - 99 mg/dL  880    BUN 8 - 23 mg/dL  24    Creatinine 9.38 - 1.24 mg/dL  8.44    Sodium 864 - 854 mmol/L 133  133   133   Potassium 3.5 - 5.1 mmol/L 3.6  4.0  3.8   Chloride 98 - 111 mmol/L  99    CO2 22 - 32 mmol/L  22    Calcium  8.9 - 10.3 mg/dL  9.2      ABG    Component Value Date/Time   PHART 7.516 (H) 10/15/2024 0557   PCO2ART 27.4 (L) 10/15/2024 0557   PO2ART 235 (H) 10/15/2024 0557   HCO3 22.1 10/15/2024 0557   TCO2 23 10/15/2024 0557   ACIDBASEDEF 3.0 (H) 10/14/2024 2005   O2SAT 100 10/15/2024 0557       James Rayas, MD 10/15/2024 3:49 PM

## 2024-10-15 NOTE — Progress Notes (Signed)
 "  NAME:  James [name], MRN:  [id number], DOB:  09/03/1961, LOS: 14 ADMISSION DATE:  10/01/2024, CONSULTATION DATE:  10/14/24 REFERRING MD:  Lucas , CHIEF COMPLAINT:  s/p LVAD   History of Present Illness:  64 yo M with stg D cardiomyopathy + inotrope dependence, CKD 3a, hx VT, pAFib, meningioma who was admitted to HF service 10/01/24 for pre-VAD optimization  This included: RHC, milrinone  titration, impella placement, levo and DBA titration and volume optimization  On 10/14/24 he went for LVAD  Pump: 116 min  EBL: 750 Product: 2 plt 4 ffp 708 cellsaver  PCCM is consulted post op ICU management.  Pertinent  Medical History  HFrEF Inotrope dependence VT pAFib  Prostate cancer Meningioma  HTN DM2   Significant Hospital Events: Including procedures, antibiotic start and stop dates in addition to other pertinent events    10/01/24 RHC w low output and pHTN, and subsequent uptitration of milrinone  from 0.25 to 0.5 10/02/24 started ND 10/05/24 high PA pressures, hignPCWP high SVR. 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 Hep to bival 10/13/24 not likely HIT. Milrinone  to 0.375  10/14/24 LVAD. PCCM consult.   Interim History / Subjective:  Overnight no acute events. He denies complaints this morning. Minimal pain. Initially apneic on SBT. Now tolerating PS.  Epi 6 Milrinone  0.375 NE 3  Objective    Blood pressure (!) 86/76, pulse (!) 144, temperature 99 F (37.2 C), resp. rate 18, height 6' 6 (1.981 m), weight 107.7 kg, SpO2 97%. PAP: (24-46)/(11-21) 34/14 CVP:  [1 mmHg-10 mmHg] 3 mmHg CO:  [6.1 L/min-8.3 L/min] 6.7 L/min CI:  [2.61 L/min/m2-3.54 L/min/m2] 2.83 L/min/m2  Vent Mode: SIMV;PRVC;PSV FiO2 (%):  [50 %-100 %] 50 % Set Rate:  [16 bmp-18 bmp] 18 bmp Vt Set:  [730 mL] 730 mL PEEP:  [5 cmH20] 5 cmH20 Pressure Support:  [10 cmH20]  10 cmH20 Plateau Pressure:  [16 cmH20-17 cmH20] 16 cmH20   Intake/Output Summary (Last 24 hours) at 10/15/2024 0644 Last data filed at 10/15/2024 0600 Gross per 24 hour  Intake 8357.51 ml  Output 4515 ml  Net 3842.51 ml   Filed Weights   10/13/24 0545 10/14/24 0610 10/15/24 0500  Weight: 96.5 kg 99 kg 107.7 kg    Examination: General:  critically ill appearing man lying in bed in NAD HENT: [place]/AT, eyes anicteric, ETT Lungs: breathing comfortably on MV, minimal secretions. Forced exhalation 1.7L on 8/5. Cardiovascular: Mechanical hum of LVAD. Thin bloody secretions from chest tubes.  Abdomen: soft, NT, ND Extremities: minimal LE edema, no cyanosis Neuro: awake, alert, moving all extremities GU: foley with amber urine   7.51/27/235/22 Coox 76% Na+ 133 BUN 24 Cr 1.55 WBC 13.1 H/H 7.8/22.7 Platelets 94 CXR personally reviewed> pulmonary edema, Swan, LVAD, chest tubes.   Resolved problem list   Assessment and Plan   Acute on chronic HFrEF, stage D cardiomyopathy s/p LVAD (HM3 10/14/24)  pHTN, acute on chronic RV failure   PVCs, NSVT  pAFib  -post-op care per TCTS -con't chest tubes -start coumadin on POD #2 assuming bleeding is controlled -LVAD titrations per AHF -amiodarone  -hold mexilitine for now -lasix  80mg  today -complete post-op antimicrobials -con't iNO, will wean after extubation. -later can start mobility and progressing diet as able  Post op vent management -LTVV -SAT & SBT-- passed extubate to [place] -con't iNO wean once doing well post extubation  CKD 3a Hyponatremia  -  strict I/O -maintain adequate perfusion  DM2 w hyperglycemia  -will transition to basal bolus insulin  after extubation  Anemia; expected operative blood loss.  Thrombocytopenia; not HIT -1 unit pRBC -monitor chest tube output -no need for platelets today  Labs   CBC: Recent Labs  Lab 10/13/24 0515 10/14/24 0410 10/14/24 0649 10/14/24 1047 10/14/24 1116 10/14/24 1419  10/14/24 1424 10/14/24 2000 10/15/24 0400 10/15/24 0557  WBC 8.0 6.9  --   --   --   --  18.2* 19.0* 13.1*  --   NEUTROABS  --   --   --   --   --   --   --   --  11.6*  --   HGB 12.1* 11.3*   < > 9.0*   < > 9.5* 8.7* 8.1* 7.8* 7.5*  HCT 34.5* 32.0*   < > 25.7*   < > 28.0* 25.6* 23.8* 22.7* 22.0*  MCV 89.4 88.9  --   --   --   --  91.8 93.3 92.3  --   PLT 62* 66*  --  62*  --   --  79*  77* 87* 94*  --    < > = values in this interval not displayed.    Basic Metabolic Panel: Recent Labs  Lab 10/13/24 0514 10/13/24 0515 10/14/24 0410 10/14/24 0649 10/14/24 1116 10/14/24 1203 10/14/24 1207 10/14/24 1419 10/14/24 1424 10/14/24 2000 10/15/24 0400 10/15/24 0557  NA  --  128* 129*   < > 132*   < > 133* 134* 132* 131* 133* 133*  K  --  3.8 3.7   < > 3.5   < > 3.4* 3.6 3.7 4.0 4.0 3.6  CL  --  95* 95*   < > 92*  --  93*  --  98 97* 99  --   CO2  --  24 25  --   --   --   --   --  26 22 22   --   GLUCOSE  --  192* 115*   < > 173*  --  154*  --  72 167* 119*  --   BUN  --  27* 27*   < > 24*  --  25*  --  23 24* 24*  --   CREATININE  --  1.67* 1.60*   < > 1.60*  --  1.50*  --  1.50* 1.59* 1.55*  --   CALCIUM   --  8.8* 8.6*  --   --   --   --   --  9.3 9.1 9.2  --   MG  --  1.7 2.0  --   --   --   --   --  1.8 2.5* 2.3  --   PHOS 1.9*  --   --   --   --   --   --   --   --   --  3.3  --    < > = values in this interval not displayed.   GFR: Estimated Creatinine Clearance: 63.1 mL/min (A) (by C-G formula based on SCr of 1.55 mg/dL (H)). Recent Labs  Lab 10/14/24 0410 10/14/24 1424 10/14/24 2000 10/15/24 0400  WBC 6.9 18.2* 19.0* 13.1*    Liver Function Tests: Recent Labs  Lab 10/10/24 1237 10/15/24 0400  AST 42* 82*  ALT 20 16  ALKPHOS 119 90  BILITOT 1.5* 1.3*  PROT 6.5 5.3*  ALBUMIN  3.7 3.4*     ABG  Component Value Date/Time   PHART 7.516 (H) 10/15/2024 0557   PCO2ART 27.4 (L) 10/15/2024 0557   PO2ART 235 (H) 10/15/2024 0557   HCO3 22.1 10/15/2024 0557    TCO2 23 10/15/2024 0557   ACIDBASEDEF 2.0 10/14/2024 1419   O2SAT 100 10/15/2024 0557     Critical care time:      This patient is critically ill with multiple organ system failure which requires frequent high complexity decision making, assessment, support, evaluation, and titration of therapies. This was completed through the application of advanced monitoring technologies and extensive interpretation of multiple databases. During this encounter critical care time was devoted to patient care services described in this note for 55 minutes.  Leita SHAUNNA Gaskins, DO 10/15/2024 9:43 AM [facility]  For contact information, see Amion. If no response to pager, please call PCCM consult pager. After hours, 7PM- 7AM, please call Elink.         "

## 2024-10-15 NOTE — Progress Notes (Signed)
 "    Advanced Heart Failure Rounding Note   [facility] Cardiologist: Dr. Zenaida  Patient Profile   James [name] is a 64 y.o. male with history of stage D cardiomyopathy/chronic systolic heart failure now end-stage w/ inotrope dependence, CKD IIIa, hx VT, PAF, prostate cancer, meningioma.   Admitted for LVAD implant/ pre-VAD HF optimization.   Significant events:   12/18: Milrinone  titrated from 0.25 to 0.5 mcg/kg/min post RHC d/t low-output and pulmonary hypertension 12/19: NE added 12/22: Markedly elevated PA pressures, elevated PCWP and elevated SVR. Titrated off NE. Added DBA.  12/23: Impella 5.5 Implanted.  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 Heparin  > Bival.  12/31: HM III LVAD implant  Subjective:    Overall doing excellent this morning. He is alert on the vent, weaning sedation to allow for extubation.  Pressor requirement improving, down to NE of 2, 7 of Epi, decreased milrinone  to 0.25.  Continues on iNO. Can wean following extubation as pressure tolerates.  LVAD interrogated, will reassess speed after extubation, no changes.   Given 1 unit of blood, 80mg  IV lasix .  PAP: (24-46)/(9-21) 34/14 CVP:  [0 mmHg-10 mmHg] 4 mmHg CO:  [5.9 L/min-8.3 L/min] 5.9 L/min CI:  [2.5 L/min/m2-3.54 L/min/m2] 2.5 L/min/m2   HeartMate 3 VAD Equipment Check Pump Speed (RPM): 5250 RPM Pump Flow (LPM): 4.7 Power (Watts): 4 Watts Pulsatility Index: 3.6 Fixed Speed Limit: 5200 rpm Low Speed Limit: 4900 rpm Alarms: No alarms Auscultated: Normal expected humming Power Module Self-Test (Daily): Done System Controller Self-Test: Passed Patient Battery Source: [facility] / Wall unit Emergency Equipment at Bedside: Yes   Objective:     Weight Range: 107.7 kg Body mass index is 27.44 kg/m.   Vital Signs:   Temp:  [96.8 F (36 C)-99.7 F (37.6 C)] 99.7 F (37.6 C) (01/01 0915) Pulse Rate:  [70-210] 185 (01/01 0845) Resp:  [12-21] 12 (01/01 0915) BP: (77-89)/(56-78) 77/64 (01/01 0900) SpO2:   [94 %-100 %] 100 % (01/01 0951) Arterial Line BP: (75-194)/(37-162) 80/60 (01/01 0915) FiO2 (%):  [36 %-100 %] 36 % (01/01 0951) Weight:  [107.7 kg] 107.7 kg (01/01 0500) Last BM Date : 10/13/24  Weight change: Filed Weights   10/13/24 0545 10/14/24 0610 10/15/24 0500  Weight: 96.5 kg 99 kg 107.7 kg   Intake/Output:  Intake/Output Summary (Last 24 hours) at 10/15/2024 1005 Last data filed at 10/15/2024 0800 Gross per 24 hour  Intake 8245.3 ml  Output 4335 ml  Net 3910.3 ml    Physical Exam   Physical Exam: GENERAL: Alert, following CARDIAC:  Mechanical heart sounds with LVAD hum present. JVP flat, chest tube with serosanguinous drainage ABDOMEN:  Soft, mildly distended LVAD exit site:  dressing C/D/I  EXTREMITIES: 1+ edema NEUROLOGIC: Moves all 4's   Telemetry   Paced 80  Labs   CBC Recent Labs    10/14/24 2000 10/14/24 2005 10/15/24 0400 10/15/24 0557  WBC 19.0*  --  13.1*  --   NEUTROABS  --   --  11.6*  --   HGB 8.1*   < > 7.8* 7.5*  HCT 23.8*   < > 22.7* 22.0*  MCV 93.3  --  92.3  --   PLT 87*  --  94*  --    < > = values in this interval not displayed.   Basic Metabolic Panel Recent Labs    87/69/74 0514 10/13/24 0515 10/14/24 2000 10/14/24 2005 10/15/24 0400 10/15/24 0557  NA  --    < > 131*   < >  133* 133*  K  --    < > 4.0   < > 4.0 3.6  CL  --    < > 97*  --  99  --   CO2  --    < > 22  --  22  --   GLUCOSE  --    < > 167*  --  119*  --   BUN  --    < > 24*  --  24*  --   CREATININE  --    < > 1.59*  --  1.55*  --   CALCIUM   --    < > 9.1  --  9.2  --   MG  --    < > 2.5*  --  2.3  --   PHOS 1.9*  --   --   --  3.3  --    < > = values in this interval not displayed.   Liver Function Tests Recent Labs    10/15/24 0400  AST 82*  ALT 16  ALKPHOS 90  BILITOT 1.3*  PROT 5.3*  ALBUMIN  3.4*    BNP (last 3 results) Recent Labs    06/10/24 1213 07/09/24 1649 07/23/24 0959  BNP 1,071.5* 3,316.0* 595.8*   ProBNP (last 3  results) Recent Labs    10/01/24 1030 10/15/24 0400  PROBNP 8,374.0* 9,508.0*   Medications:    Scheduled Medications:  sodium chloride    Intravenous Once   acetaminophen   1,000 mg Oral Q6H   Or   acetaminophen  (TYLENOL ) oral liquid 160 mg/5 mL  1,000 mg Per Tube Q6H   bisacodyl   10 mg Oral Daily   Or   bisacodyl   10 mg Rectal Daily   Chlorhexidine  Gluconate Cloth  6 each Topical Daily   docusate  200 mg Per Tube Daily   furosemide   80 mg Intravenous Once   gabapentin  200 mg Per Tube QHS   metoCLOPramide (REGLAN) injection  10 mg Intravenous Q6H   mouth rinse  15 mL Mouth Rinse Q2H   pantoprazole   40 mg Oral Daily   polyethylene glycol  17 g Per Tube Daily   sodium chloride  flush  10-40 mL Intracatheter Q12H   sodium chloride  flush  3 mL Intravenous Q12H    Infusions:  sodium chloride      sodium chloride      sodium chloride  10 mL/hr at 10/15/24 0800   albumin  human Stopped (10/14/24 2019)   amiodarone  30 mg/hr (10/15/24 0916)    ceFAZolin  (ANCEF ) IV Stopped (10/15/24 0540)   epinephrine  6 mcg/min (10/15/24 0800)   insulin  1.8 Units/hr (10/15/24 0800)   lactated ringers      lactated ringers  20 mL/hr at 10/15/24 0800   milrinone  0.25 mcg/kg/min (10/15/24 0805)   norepinephrine  (LEVOPHED ) Adult infusion 3 mcg/min (10/15/24 0800)   vancomycin 1,000 mg (10/15/24 0909)    PRN Medications: sodium chloride , albumin  human, dextrose , morphine injection, ondansetron  (ZOFRAN ) IV, mouth rinse, oxyCODONE , sodium chloride  flush, sodium chloride  flush  Assessment/Plan   1. Acute on chronic systolic CHF, NYHA IV: Nonischemic cardiomyopathy, planned LVAD. RHC 12/18 showed elevated filling pressures with mod-severe mixed pulmonary arterial/pulmonary venous hypertension and low CO despite milrinone  0.25, increased to 0.5. Impella 5.5 placed 12/23 for further optimization. - S/p HM III LVAD implant by Dr. Lucas 10/14/24 - Wean milrinone  to 0.25, weaning NE, continue Epi at 7 and come  down after NE. - iNO to be weaned after extubation - Follow hemodynamics on PA catheter,  low filling pressures but weight up significantly - Start diuresis with 80mg  IV lasix , given 1 unit pRBC - Suspect  will need more in the coming days - Extubate today  2. Pulmonary hypertension: Severe combined pre/post capillary PH on RHC, suspect largely due to venous remodeling. Improved with adequate unloading. - Follow on PAC post VAD - Wean iNO after extubation  3.  AKI on CKD stage 3: Trending down with Impella, 1.5 today.  - Diuresis as above  4.  PVCs/NSVT: Exacerbated by dual inotrope support. Has Biotronik ICD.   - Continue IV amio. - Potentially add back mexiletine tomorrow. - Tachy therapies off for VAD implant  5.  Paroxysmal Atrial fibrillation: Remains in NSR.  - Continue amiodarone  as above.  - Potentially warfarin per TCTS tomorrow  6. Meningioma: Monitoring.   7. Prostate cancer: Treated with radiation.   8. Thrombocytopenia: Slow fall in plts, suspect consumptive with impella.  - HIT negative, SRA pending - Up to 94 - received 2 u platelets and 4 ffp in OR  ABLA:  - Down to 7.8, give 1 unit pRBC   CRITICAL CARE Performed by: [name]   Total critical care time: 40 minutes  Critical care time was exclusive of separately billable procedures and treating other patients.  Critical care was necessary to treat or prevent imminent or life-threatening deterioration.  Critical care was time spent personally by me on the following activities: development of treatment plan with patient and/or surrogate as well as nursing, discussions with consultants, evaluation of patient's response to treatment, examination of patient, obtaining history from patient or surrogate, ordering and performing treatments and interventions, ordering and review of laboratory studies, ordering and review of radiographic studies, pulse oximetry and re-evaluation of patient's condition.  "

## 2024-10-15 NOTE — Progress Notes (Signed)
 HeartMate 3 Rounding Note  Subjective:    Stable night on vent. Milrinone  0.375, NE down to 3, epi 6  CI 2.5, PA 31/12 CVP 3, Co-ox 76   LVAD INTERROGATION:  HeartMate IIl LVAD:  Flow 4/8 liters/min, speed 5200, power 3.4, PI 3.5.    Objective:    Vital Signs:   Temp:  [96.8 F (36 C)-99.5 F (37.5 C)] 99.5 F (37.5 C) (01/01 0800) Pulse Rate:  [70-210] 81 (01/01 0800) Resp:  [14-21] 18 (01/01 0800) BP: (80-89)/(56-78) 87/74 (01/01 0800) SpO2:  [94 %-100 %] 100 % (01/01 0800) Arterial Line BP: (75-194)/(37-162) 93/63 (01/01 0800) FiO2 (%):  [50 %-100 %] 50 % (01/01 0800) Weight:  [107.7 kg] 107.7 kg (01/01 0500) Last BM Date : 10/13/24 Mean arterial Pressure 70's  Intake/Output:   Intake/Output Summary (Last 24 hours) at 10/15/2024 0823 Last data filed at 10/15/2024 0800 Gross per 24 hour  Intake 8945.3 ml  Output 4635 ml  Net 4310.3 ml     Physical Exam: General:  Well appearing on vent HEENT: intubated Cor: Distant heart sounds with LVAD hum present. Lungs: clear Abdomen: soft, nontender, non-distended, few bowel sounds. Extremities: moderate edema Neuro: alert & orientedx3, cranial nerves grossly intact. moves all 4 extremities w/o difficulty. Affect pleasant  Telemetry: sinus 65 under pacer. Atrial paced at 80.  Labs: Basic Metabolic Panel: Recent Labs  Lab 10/13/24 0514 10/13/24 0515 10/14/24 0410 10/14/24 0649 10/14/24 1116 10/14/24 1203 10/14/24 1207 10/14/24 1419 10/14/24 1424 10/14/24 2000 10/15/24 0400 10/15/24 0557  NA  --  128* 129*   < > 132*   < > 133* 134* 132* 131* 133* 133*  K  --  3.8 3.7   < > 3.5   < > 3.4* 3.6 3.7 4.0 4.0 3.6  CL  --  95* 95*   < > 92*  --  93*  --  98 97* 99  --   CO2  --  24 25  --   --   --   --   --  26 22 22   --   GLUCOSE  --  192* 115*   < > 173*  --  154*  --  72 167* 119*  --   BUN  --  27* 27*   < > 24*  --  25*  --  23 24* 24*  --   CREATININE  --  1.67* 1.60*   < > 1.60*  --  1.50*  --  1.50* 1.59*  1.55*  --   CALCIUM   --  8.8* 8.6*  --   --   --   --   --  9.3 9.1 9.2  --   MG  --  1.7 2.0  --   --   --   --   --  1.8 2.5* 2.3  --   PHOS 1.9*  --   --   --   --   --   --   --   --   --  3.3  --    < > = values in this interval not displayed.    Liver Function Tests: Recent Labs  Lab 10/10/24 1237 10/15/24 0400  AST 42* 82*  ALT 20 16  ALKPHOS 119 90  BILITOT 1.5* 1.3*  PROT 6.5 5.3*  ALBUMIN  3.7 3.4*   No results for input(s): LIPASE, AMYLASE in the last 168 hours. No results for input(s): AMMONIA in the last 168 hours.  CBC: Recent  Labs  Lab 10/13/24 0515 10/14/24 0410 10/14/24 0649 10/14/24 1047 10/14/24 1116 10/14/24 1419 10/14/24 1424 10/14/24 2000 10/15/24 0400 10/15/24 0557  WBC 8.0 6.9  --   --   --   --  18.2* 19.0* 13.1*  --   NEUTROABS  --   --   --   --   --   --   --   --  11.6*  --   HGB 12.1* 11.3*   < > 9.0*   < > 9.5* 8.7* 8.1* 7.8* 7.5*  HCT 34.5* 32.0*   < > 25.7*   < > 28.0* 25.6* 23.8* 22.7* 22.0*  MCV 89.4 88.9  --   --   --   --  91.8 93.3 92.3  --   PLT 62* 66*  --  62*  --   --  79*  77* 87* 94*  --    < > = values in this interval not displayed.    INR: Recent Labs  Lab 10/14/24 1424 10/15/24 0400  INR 1.2  1.2 1.2    Other results: EKG:   Imaging: ECHO INTRAOPERATIVE TEE Result Date: 10/14/2024  *INTRAOPERATIVE TRANSESOPHAGEAL REPORT *  Patient Name:   JASAUN CARN Date of Exam: 10/14/2024 Medical Rec #:  [id number]         Height:       78.0 in Accession #:    [id number]        Weight:       218.3 lb Date of Birth:  [birth year]         BSA:          2.34 m Patient Age:    64 years          BP:           128/78 mmHg Patient Gender: M                 HR:           60 bpm. Exam Location:  Inpatient Transesophogeal exam was perform intraoperatively during surgical procedure. Patient was closely monitored under general anesthesia during the entirety of examination. Indications:     LVAD insertion Performing Phys: [id number]  Marivel Mcclarty K Ethne Jeon Diagnosing Phys: Cordella Stoltzfus Complications: No known complications during this procedure. POST-OP IMPRESSIONS _ Right Ventricle: The right ventricle appears unchanged from pre-bypass. _ Aortic Valve: There is trivial regurgitation. _ Mitral Valve: There is mild regurgitation. _ Tricuspid Valve: The tricuspid valve appears unchanged from pre-bypass. _ Pulmonic Valve: The pulmonic valve appears unchanged from pre-bypass. _ Pericardium: The pericardium appears unchanged from pre-bypass. _ Comments: S/p HM III implant. Emergence from CPB on NE and Epi support alongside milrinone  and INO for right support.  Left Ventricular Assist Device; Ramp Study: The aortic valve opens frequently consistent with significant native left ventricular ejection. The inflow cannula was visualized and No evidence of obstruction to flow of the inflow cannula or outflow cannulas, doppler velocities WNL (inflow .83m/s, outflow .57m/s). LVAD function appears to be normal. Ramp from 4200rpm to 5100rpm at time of OR exit. Mild left to right IV septal bulging post placement.  PRE-OP FINDINGS  Left Ventricle: The left ventricle has severely reduced systolic function, with an ejection fraction of <20%. The cavity size was severely dilated. Left ventricular diffuse hypokinesis. Impella 5.5 in situ appropriately positioned ~5.3cm from AA. no LV thrombus Right Ventricle: The right ventricle has mildly reduced systolic function. The cavity was normal. There is no increase  in right ventricular wall thickness. Right ventricular systolic pressure is moderately elevated. Left Atrium: Left atrial size was dilated. No left atrial/left atrial appendage thrombus was detected. There is echo contrast seen in the left atrial cavity and left atrial appendage. Left atrial appendage velocity is reduced at less than 40 cm/s. Right Atrium: Right atrial size was normal in size. There is spontaneous echo contrast seen in the right atrial cavity.  Interatrial Septum: No atrial level shunt detected by color flow Doppler. There is no evidence of a patent foramen ovale. Pericardium: There is no evidence of pericardial effusion. There is no pleural effusion. Mitral Valve: The mitral valve is normal in structure. Mitral valve regurgitation is moderate by color flow Doppler. The MR jet is centrally-directed. There is no evidence of mitral valve vegetation. There is No evidence of mitral stenosis. Tricuspid Valve: The tricuspid valve was normal in structure. Tricuspid valve regurgitation is mild by color flow Doppler. The jet is directed centrally. No evidence of tricuspid stenosis is present. There is no evidence of tricuspid valve vegetation. Aortic Valve: The aortic valve is tricuspid Aortic valve regurgitation mild 2/2 in situ impella. There is no stenosis of the aortic valve. There is no evidence of aortic valve vegetation. Pulmonic Valve: The pulmonic valve was normal in structure No evidence of pumonic stenosis. Pulmonic valve regurgitation is mild by color flow Doppler. Aorta: The ascending aorta are normal in size and structure. There is mild dilatation of the aortic root, measuring 43 mm. Pulmonary Artery: [name] Purl catheter present on the left. The pulmonary artery is of normal size. Shunts: There is no evidence of an atrial septal defect.  Cordella Fix Electronically signed by Cordella Fix Signature Date/Time: 10/14/2024/5:14:16 PM    Final    DG Chest Port 1 View Result Date: 10/14/2024 EXAM: 1 VIEW(S) XRAY OF THE CHEST 10/14/2024 02:28:00 PM COMPARISON: 10/06/2024 CLINICAL HISTORY: LVAD (left ventricular assist device) present (HCC) FINDINGS: LINES, TUBES AND DEVICES: Left chest tube in place. Mediastinal and left chest drains in place. Left IJ PA catheter in place with tip in expected area of right pulmonary artery. Endotracheal tube in place with tip 6 cm above carina. Enteric tube partially visualized coursing below diaphragm. LUNGS  AND PLEURA: Mild pulmonary edema. Bibasilar atelectasis. No pleural effusion. No pneumothorax. HEART AND MEDIASTINUM: LVAD in place. Left chest ICD noted. Stable cardiomegaly. BONES AND SOFT TISSUES: Interval sternotomy noted. IMPRESSION: 1. LVAD in place. 2. Mild pulmonary edema and bibasilar atelectasis. Electronically signed by: Waddell Calk MD 10/14/2024 02:55 PM EST RP Workstation: [id number]     Medications:     Scheduled Medications:  acetaminophen   1,000 mg Oral Q6H   Or   acetaminophen  (TYLENOL ) oral liquid 160 mg/5 mL  1,000 mg Per Tube Q6H   bisacodyl   10 mg Oral Daily   Or   bisacodyl   10 mg Rectal Daily   Chlorhexidine  Gluconate Cloth  6 each Topical Daily   docusate  200 mg Per Tube Daily   fentaNYL  (SUBLIMAZE ) injection  25-50 mcg Intravenous Once   gabapentin  200 mg Per Tube QHS   metoCLOPramide (REGLAN) injection  10 mg Intravenous Q6H   mouth rinse  15 mL Mouth Rinse Q2H   pantoprazole   40 mg Oral Daily   polyethylene glycol  17 g Per Tube Daily   sodium chloride  flush  10-40 mL Intracatheter Q12H   sodium chloride  flush  3 mL Intravenous Q12H    Infusions:  sodium chloride   sodium chloride      sodium chloride  10 mL/hr at 10/15/24 0800   albumin  human Stopped (10/14/24 2019)   amiodarone  30 mg/hr (10/15/24 0800)    ceFAZolin  (ANCEF ) IV Stopped (10/15/24 0540)   dexmedetomidine (PRECEDEX) IV infusion 0.3 mcg/kg/hr (10/15/24 0800)   epinephrine  6 mcg/min (10/15/24 0800)   fentaNYL  infusion INTRAVENOUS Stopped (10/15/24 0600)   insulin  1.8 Units/hr (10/15/24 0800)   lactated ringers      lactated ringers  20 mL/hr at 10/15/24 0800   milrinone  0.25 mcg/kg/min (10/15/24 0805)   norepinephrine  (LEVOPHED ) Adult infusion 3 mcg/min (10/15/24 0800)   rifampin (RIFADIN) 600 mg in sodium chloride  0.9 % 100 mL IVPB 600 mg (10/15/24 0811)   vancomycin Stopped (10/14/24 2211)    PRN Medications: sodium chloride , albumin  human, dextrose , fentaNYL , morphine  injection, ondansetron  (ZOFRAN ) IV, mouth rinse, oxyCODONE , sodium chloride  flush, sodium chloride  flush   Assessment:   POD 1 HM 3 LVAD and removal of Impella 5.5 for Stage D NICM, LVEF < 20% with NYHA class IV symptoms on milrinone  at home. Preop right axillary Impella 5.5 for optimization due to low output and Co-ox on milrinone  alone.  Moderate to severe pulmonary HTN preop improved with Impella and diuresis. Much better postop after LVAD.   Preop AKI on stage 3a CKD due to low output. Improved with Impella and milrinone .  Hx of PVC's and NSVT. Has Biotronik ICD with therapies still off periop.  PAF, maintaining NSR on IV amio.  Preop thrombocytopenia, HIT negative. Improved postop with plt transfusion. Continue to follow.   Expected acute postop blood loss anemia.  Plan/Discussion:    He is hemodynamically stable with normal LVAD function and fairly low filling pressures.  Plan vent wean and extubation to nasal NO per CCM this morning.  Inotropes per AHF team.  Would benefit from transfusion with Hgb 7.5 followed by diruesis.  Will keep all chest tubes and Blake drain in today.  Probably start Coumadin tomorrow.  I reviewed the LVAD parameters from today, and compared the results to the patient's prior recorded data.  No programming changes were made.  The LVAD is functioning within specified parameters.  Length of Stay: [address]  Dorise POUR [facility] 10/15/2024, 8:23 AM

## 2024-10-15 NOTE — Progress Notes (Signed)
 LVAD dressing changed by this RN using sterile technique. Site intact with scant odorless serosanguineous drainage on old dressing. New dressing applied with anchor intact. No yellow line present. Next dressing change by LVAD coordinator or dressing champion 10/16/2024.

## 2024-10-15 NOTE — Procedures (Signed)
 Extubation Procedure Note  Patient Details:   Name: James [name] DOB: 1961/08/16 MRN: [id number]   Airway Documentation:  Airway 8 mm (Active)  Secured at (cm) 27 cm 10/15/24 0800  Measured From Lips 10/15/24 0800  Secured Location Left 10/15/24 0752  Secured By [employer] 10/15/24 0800  Bite Block No 10/15/24 0800  Tube Holder Repositioned Yes 10/15/24 0752  Prone position No 10/15/24 0800  Cuff Pressure (cm H2O) Clear OR 27-39 CmH2O 10/15/24 0105  Site Condition Dry 10/15/24 0800   Vent end date: (not recorded) Vent end time: (not recorded)   Evaluation  O2 sats: stable throughout Complications: No apparent complications Patient did tolerate procedure well. Bilateral Breath Sounds: Clear, Diminished   Yes, pt could speak post extubation.  Pt extubated to 4 /lm [place] with Nitric oxide at 20 ppm.  [name] 10/15/2024, 9:53 AM

## 2024-10-15 NOTE — Progress Notes (Signed)
 Nitric weaned to 15 ppm per Dr. Gaylene order.

## 2024-10-16 ENCOUNTER — Inpatient Hospital Stay (HOSPITAL_COMMUNITY)

## 2024-10-16 ENCOUNTER — Encounter (HOSPITAL_COMMUNITY): Payer: Self-pay | Admitting: Surgery

## 2024-10-16 DIAGNOSIS — I480 Paroxysmal atrial fibrillation: Secondary | ICD-10-CM | POA: Diagnosis not present

## 2024-10-16 DIAGNOSIS — R570 Cardiogenic shock: Secondary | ICD-10-CM | POA: Diagnosis not present

## 2024-10-16 DIAGNOSIS — N1831 Chronic kidney disease, stage 3a: Secondary | ICD-10-CM

## 2024-10-16 DIAGNOSIS — E871 Hypo-osmolality and hyponatremia: Secondary | ICD-10-CM

## 2024-10-16 DIAGNOSIS — E1165 Type 2 diabetes mellitus with hyperglycemia: Secondary | ICD-10-CM

## 2024-10-16 DIAGNOSIS — Z794 Long term (current) use of insulin: Secondary | ICD-10-CM | POA: Diagnosis not present

## 2024-10-16 DIAGNOSIS — I130 Hypertensive heart and chronic kidney disease with heart failure and stage 1 through stage 4 chronic kidney disease, or unspecified chronic kidney disease: Secondary | ICD-10-CM

## 2024-10-16 DIAGNOSIS — I5082 Biventricular heart failure: Secondary | ICD-10-CM

## 2024-10-16 DIAGNOSIS — I5023 Acute on chronic systolic (congestive) heart failure: Secondary | ICD-10-CM

## 2024-10-16 DIAGNOSIS — Z95811 Presence of heart assist device: Secondary | ICD-10-CM | POA: Diagnosis not present

## 2024-10-16 DIAGNOSIS — E1122 Type 2 diabetes mellitus with diabetic chronic kidney disease: Secondary | ICD-10-CM | POA: Diagnosis not present

## 2024-10-16 DIAGNOSIS — D72829 Elevated white blood cell count, unspecified: Secondary | ICD-10-CM

## 2024-10-16 DIAGNOSIS — I428 Other cardiomyopathies: Secondary | ICD-10-CM | POA: Diagnosis not present

## 2024-10-16 DIAGNOSIS — R609 Edema, unspecified: Secondary | ICD-10-CM | POA: Diagnosis not present

## 2024-10-16 LAB — TYPE AND SCREEN
ABO/RH(D): O POS
Antibody Screen: NEGATIVE
Unit division: 0
Unit division: 0
Unit division: 0
Unit division: 0
Unit division: 0

## 2024-10-16 LAB — POCT I-STAT 7, (LYTES, BLD GAS, ICA,H+H)
Acid-base deficit: 3 mmol/L — ABNORMAL HIGH (ref 0.0–2.0)
Bicarbonate: 21.3 mmol/L (ref 20.0–28.0)
Calcium, Ion: 1.19 mmol/L (ref 1.15–1.40)
HCT: 27 % — ABNORMAL LOW (ref 39.0–52.0)
Hemoglobin: 9.2 g/dL — ABNORMAL LOW (ref 13.0–17.0)
O2 Saturation: 99 %
Patient temperature: 37.3
Potassium: 3.7 mmol/L (ref 3.5–5.1)
Sodium: 130 mmol/L — ABNORMAL LOW (ref 135–145)
TCO2: 22 mmol/L (ref 22–32)
pCO2 arterial: 35.1 mmHg (ref 32–48)
pH, Arterial: 7.393 (ref 7.35–7.45)
pO2, Arterial: 118 mmHg — ABNORMAL HIGH (ref 83–108)

## 2024-10-16 LAB — BPAM RBC
Blood Product Expiration Date: 202601232359
Blood Product Expiration Date: 202601272359
Blood Product Expiration Date: 202601272359
Blood Product Expiration Date: 202601272359
Blood Product Expiration Date: 202601272359
ISSUE DATE / TIME: 202512312144
ISSUE DATE / TIME: 202512312211
ISSUE DATE / TIME: 202512312211
ISSUE DATE / TIME: 202601011016
ISSUE DATE / TIME: 202601011353
Unit Type and Rh: 5100
Unit Type and Rh: 5100
Unit Type and Rh: 5100
Unit Type and Rh: 5100
Unit Type and Rh: 5100

## 2024-10-16 LAB — BASIC METABOLIC PANEL WITH GFR
Anion gap: 14 (ref 5–15)
Anion gap: 15 (ref 5–15)
BUN: 30 mg/dL — ABNORMAL HIGH (ref 8–23)
BUN: 35 mg/dL — ABNORMAL HIGH (ref 8–23)
CO2: 19 mmol/L — ABNORMAL LOW (ref 22–32)
CO2: 19 mmol/L — ABNORMAL LOW (ref 22–32)
Calcium: 8.7 mg/dL — ABNORMAL LOW (ref 8.9–10.3)
Calcium: 8.8 mg/dL — ABNORMAL LOW (ref 8.9–10.3)
Chloride: 92 mmol/L — ABNORMAL LOW (ref 98–111)
Chloride: 92 mmol/L — ABNORMAL LOW (ref 98–111)
Creatinine, Ser: 2.02 mg/dL — ABNORMAL HIGH (ref 0.61–1.24)
Creatinine, Ser: 2.11 mg/dL — ABNORMAL HIGH (ref 0.61–1.24)
GFR, Estimated: 36 mL/min — ABNORMAL LOW
GFR, Estimated: 35 mL/min — ABNORMAL LOW
Glucose, Bld: 177 mg/dL — ABNORMAL HIGH (ref 70–99)
Glucose, Bld: 234 mg/dL — ABNORMAL HIGH (ref 70–99)
Potassium: 4.4 mmol/L (ref 3.5–5.1)
Potassium: 3.7 mmol/L (ref 3.5–5.1)
Sodium: 126 mmol/L — ABNORMAL LOW (ref 135–145)
Sodium: 126 mmol/L — ABNORMAL LOW (ref 135–145)

## 2024-10-16 LAB — COOXEMETRY PANEL
Carboxyhemoglobin: 1.5 % (ref 0.5–1.5)
Carboxyhemoglobin: 1.1 % (ref 0.5–1.5)
Methemoglobin: 0.7 % (ref 0.0–1.5)
Methemoglobin: 0.8 % (ref 0.0–1.5)
O2 Saturation: 52.1 %
O2 Saturation: 48.9 %
Total hemoglobin: 8.9 g/dL — ABNORMAL LOW (ref 12.0–16.0)
Total hemoglobin: 8.9 g/dL — ABNORMAL LOW (ref 12.0–16.0)

## 2024-10-16 LAB — COMPREHENSIVE METABOLIC PANEL WITH GFR
ALT: 15 U/L (ref 0–44)
AST: 102 U/L — ABNORMAL HIGH (ref 15–41)
Albumin: 3.6 g/dL (ref 3.5–5.0)
Alkaline Phosphatase: 87 U/L (ref 38–126)
Anion gap: 12 (ref 5–15)
BUN: 24 mg/dL — ABNORMAL HIGH (ref 8–23)
CO2: 22 mmol/L (ref 22–32)
Calcium: 9 mg/dL (ref 8.9–10.3)
Chloride: 94 mmol/L — ABNORMAL LOW (ref 98–111)
Creatinine, Ser: 1.62 mg/dL — ABNORMAL HIGH (ref 0.61–1.24)
GFR, Estimated: 47 mL/min — ABNORMAL LOW
Glucose, Bld: 154 mg/dL — ABNORMAL HIGH (ref 70–99)
Potassium: 4 mmol/L (ref 3.5–5.1)
Sodium: 128 mmol/L — ABNORMAL LOW (ref 135–145)
Total Bilirubin: 1.2 mg/dL (ref 0.0–1.2)
Total Protein: 5.9 g/dL — ABNORMAL LOW (ref 6.5–8.1)

## 2024-10-16 LAB — ECHOCARDIOGRAM COMPLETE
Est EF: <20
Height: 78 in
S' Lateral: 4.5 cm
Weight: 3767.22 [oz_av]

## 2024-10-16 LAB — PHOSPHORUS: Phosphorus: 4.4 mg/dL (ref 2.5–4.6)

## 2024-10-16 LAB — CBC WITH DIFFERENTIAL/PLATELET
Abs Immature Granulocytes: 0.24 K/uL — ABNORMAL HIGH (ref 0.00–0.07)
Basophils Absolute: 0 K/uL (ref 0.0–0.1)
Basophils Relative: 0 %
Eosinophils Absolute: 0 K/uL (ref 0.0–0.5)
Eosinophils Relative: 0 %
HCT: 25.4 % — ABNORMAL LOW (ref 39.0–52.0)
Hemoglobin: 8.6 g/dL — ABNORMAL LOW (ref 13.0–17.0)
Immature Granulocytes: 1 %
Lymphocytes Relative: 2 %
Lymphs Abs: 0.4 K/uL — ABNORMAL LOW (ref 0.7–4.0)
MCH: 31.3 pg (ref 26.0–34.0)
MCHC: 33.9 g/dL (ref 30.0–36.0)
MCV: 92.4 fL (ref 80.0–100.0)
Monocytes Absolute: 1.6 K/uL — ABNORMAL HIGH (ref 0.1–1.0)
Monocytes Relative: 7 %
Neutro Abs: 19.3 K/uL — ABNORMAL HIGH (ref 1.7–7.7)
Neutrophils Relative %: 90 %
Platelets: 124 K/uL — ABNORMAL LOW (ref 150–400)
RBC: 2.75 MIL/uL — ABNORMAL LOW (ref 4.22–5.81)
RDW: 17.7 % — ABNORMAL HIGH (ref 11.5–15.5)
WBC: 21.5 K/uL — ABNORMAL HIGH (ref 4.0–10.5)
nRBC: 0 % (ref 0.0–0.2)

## 2024-10-16 LAB — GLUCOSE, CAPILLARY
Glucose-Capillary: 143 mg/dL — ABNORMAL HIGH (ref 70–99)
Glucose-Capillary: 237 mg/dL — ABNORMAL HIGH (ref 70–99)
Glucose-Capillary: 175 mg/dL — ABNORMAL HIGH (ref 70–99)
Glucose-Capillary: 180 mg/dL — ABNORMAL HIGH (ref 70–99)
Glucose-Capillary: 200 mg/dL — ABNORMAL HIGH (ref 70–99)

## 2024-10-16 LAB — PROTIME-INR
INR: 1.3 — ABNORMAL HIGH (ref 0.8–1.2)
Prothrombin Time: 16.4 s — ABNORMAL HIGH (ref 11.4–15.2)

## 2024-10-16 LAB — MAGNESIUM: Magnesium: 2 mg/dL (ref 1.7–2.4)

## 2024-10-16 LAB — SURGICAL PATHOLOGY

## 2024-10-16 LAB — CALCIUM, IONIZED: Calcium, Ionized, Serum: 5.4 mg/dL (ref 4.5–5.6)

## 2024-10-16 LAB — LACTATE DEHYDROGENASE: LDH: 703 U/L — ABNORMAL HIGH (ref 105–235)

## 2024-10-16 LAB — PATHOLOGIST SMEAR REVIEW

## 2024-10-16 LAB — CG4 I-STAT (LACTIC ACID): Lactic Acid, Venous: 1.6 mmol/L (ref 0.5–1.9)

## 2024-10-16 MED ORDER — FUROSEMIDE 10 MG/ML IJ SOLN
120.0000 mg | Freq: Three times a day (TID) | INTRAVENOUS | Status: DC
  Administered 2024-10-16: 120 mg via INTRAVENOUS
  Filled 2024-10-16 (×2): qty 120

## 2024-10-16 MED ORDER — MEXILETINE HCL 200 MG PO CAPS
200.0000 mg | ORAL_CAPSULE | Freq: Three times a day (TID) | ORAL | Status: DC
  Administered 2024-10-16 – 2024-10-25 (×28): 200 mg via ORAL
  Filled 2024-10-16 (×28): qty 1

## 2024-10-16 MED ORDER — INSULIN GLARGINE 100 UNIT/ML ~~LOC~~ SOLN
10.0000 [IU] | Freq: Every day | SUBCUTANEOUS | Status: DC
  Administered 2024-10-16: 10 [IU] via SUBCUTANEOUS
  Filled 2024-10-16 (×2): qty 0.1

## 2024-10-16 MED ORDER — FUROSEMIDE 10 MG/ML IJ SOLN
20.0000 mg/h | INTRAVENOUS | Status: DC
  Administered 2024-10-16 – 2024-10-20 (×8): 20 mg/h via INTRAVENOUS
  Filled 2024-10-16 (×2): qty 200
  Filled 2024-10-16 (×3): qty 20
  Filled 2024-10-16: qty 200
  Filled 2024-10-16: qty 20
  Filled 2024-10-16: qty 200
  Filled 2024-10-16: qty 20

## 2024-10-16 MED ORDER — POTASSIUM CHLORIDE 20 MEQ PO PACK
40.0000 meq | PACK | Freq: Two times a day (BID) | ORAL | Status: DC
  Administered 2024-10-16 – 2024-10-18 (×6): 40 meq via ORAL
  Filled 2024-10-16 (×8): qty 2

## 2024-10-16 MED ORDER — FUROSEMIDE 10 MG/ML IJ SOLN
120.0000 mg | Freq: Three times a day (TID) | INTRAVENOUS | Status: DC
  Administered 2024-10-16: 120 mg via INTRAVENOUS
  Filled 2024-10-16: qty 10
  Filled 2024-10-16 (×2): qty 12

## 2024-10-16 MED ORDER — FUROSEMIDE 10 MG/ML IJ SOLN
120.0000 mg | Freq: Once | INTRAVENOUS | Status: AC
  Administered 2024-10-16: 120 mg via INTRAVENOUS
  Filled 2024-10-16: qty 12

## 2024-10-16 MED ORDER — FENTANYL CITRATE (PF) 50 MCG/ML IJ SOSY: 25.0000 ug | PREFILLED_SYRINGE | INTRAMUSCULAR | Status: DC | PRN

## 2024-10-16 MED ORDER — WARFARIN SODIUM 2.5 MG PO TABS
2.5000 mg | ORAL_TABLET | Freq: Once | ORAL | Status: AC
  Administered 2024-10-16: 2.5 mg via ORAL
  Filled 2024-10-16: qty 1

## 2024-10-16 MED ORDER — FUROSEMIDE 10 MG/ML IJ SOLN
120.0000 mg | Freq: Three times a day (TID) | INTRAVENOUS | Status: DC
  Filled 2024-10-16: qty 12

## 2024-10-16 MED ORDER — WARFARIN - PHARMACIST DOSING INPATIENT: Freq: Every day | Status: DC

## 2024-10-16 MED ORDER — SENNA 8.6 MG PO TABS
2.0000 | ORAL_TABLET | Freq: Every day | ORAL | Status: DC
  Administered 2024-10-16 – 2024-10-17 (×2): 17.2 mg via ORAL
  Filled 2024-10-16 (×3): qty 2

## 2024-10-16 NOTE — Evaluation (Signed)
 Occupational Therapy Evaluation Patient Details Name: James [name] MRN: [id number] DOB: [birth year] Today's Date: 10/16/2024   History of Present Illness   64 yo s/p HM III LVAD due to cardiomyopathy/chronic systolic heart failure. PMH: CKD IIIa, hx VT, PAF, prostate cancer, meningioma.     Clinical Impressions PTA pt lives independently with his wife and works as a merchandiser, retail at [employer] and enjoys hunting and doing yard work. Pt very motivated to get OOB. Requires mod A for bed mobility and min A +2 for transfer to chair, taking several steps to chair with HHA. Overall Max A with ADL tasks due to sternal precautions and deficits listed below. Began education on sternal precautions and importance of always having controller secured to him prior to mobility to prevent driveline from being pulled. At this time, recommend intensive inpatient follow-up therapy, >3 hours/day to maximize functional level of independence with goal of returning home with family. Son present and supportive during session. VSS on 2L. Acute OT to follow.      If plan is discharge home, recommend the following:   A lot of help with walking and/or transfers;A lot of help with bathing/dressing/bathroom     Functional Status Assessment   Patient has had a recent decline in their functional status and demonstrates the ability to make significant improvements in function in a reasonable and predictable amount of time.     Equipment Recommendations   BSC/3in1     Recommendations for Other Services   Rehab consult     Precautions/Restrictions   Precautions Precautions: Fall;Sternal;Other (comment) (LVAD; pacing wires, chest tube) Precaution Booklet Issued: No Restrictions Weight Bearing Restrictions Per Provider Order: Yes Other Position/Activity Restrictions: sternal precautions     Mobility Bed Mobility Overal bed mobility: Needs Assistance Bed Mobility: Supine to Sit     Supine  to sit: Mod assist, +2 for physical for lines     General bed mobility comments: HOB elevated    Transfers Overall transfer level: Needs assistance Equipment used: 2 person hand held assist Transfers: Sit to/from Stand, Bed to chair/wheelchair/BSC Sit to Stand: Min assist, +2 physical assistance     Step pivot transfers: Min assist, +2 physical assistance     General transfer comment: took several steps forward      Balance Overall balance assessment: Needs assistance   Sitting balance-Leahy Scale: Fair Sitting balance - Comments: R lateral lean  Cues for upright positioning/midline postural control   Standing balance-Leahy Scale: Poor                             ADL either performed or assessed with clinical judgement   ADL Overall ADL's : Needs assistance/impaired Eating/Feeding: Set up   Grooming: Moderate assistance   Upper Body Bathing: Moderate assistance   Lower Body Bathing: Maximal assistance;Sit to/from stand   Upper Body Dressing : Maximal assistance   Lower Body Dressing: Maximal assistance   Toilet Transfer: Moderate assistance;+2 for physical assistance   Toileting- Clothing Manipulation and Hygiene: Maximal assistance       Functional mobility during ADLs: Moderate assistance;+2 for safety/equipment       Vision         Perception         Praxis         Pertinent Vitals/Pain Pain Assessment Pain Assessment: Faces Faces Pain Scale: Hurts little more Pain Location: chest Pain Descriptors / Indicators: Discomfort Pain Intervention(s): Limited activity within  patient's tolerance     Extremity/Trunk Assessment Upper Extremity Assessment Upper Extremity Assessment: Right hand dominant (ROM and strength appear WFL  within sternal parameters)   Lower Extremity Assessment Lower Extremity Assessment: Defer to PT evaluation   Cervical / Trunk Assessment Cervical / Trunk Assessment: Other exceptions (sternal precuations;  LVAD/drive line)   Communication Communication Communication: No apparent difficulties   Cognition Arousal: Alert Behavior During Therapy: WFL for tasks assessed/performed               OT - Cognition Comments: will further assess; appeared confused with answering home questions at times; poor attention - may be related to poor sleep and medication; will further assess                 Following commands: Impaired Following commands impaired: Follows one step commands with increased time     Cueing  General Comments   Cueing Techniques: Verbal cues;Visual cues      Exercises Exercises: Other exercises Other Exercises Other Exercises: incentive spriometer x 5 - able to pull over 500 ml   Shoulder Instructions      Home Living Family/patient expects to be discharged to:: Private residence Living Arrangements: Spouse/significant other Available Help at Discharge: Family (potentially can arrange 24/7, wife works second shift) Type of Home: House Home Access: Stairs to enter [employer] of Steps: 4 (in back (easiest access to home per pt), 6 at side) Entrance Stairs-Rails: Can reach both (on x4 stairs in back) Home Layout: One level     Bathroom Shower/Tub: Chief Strategy Officer: Standard     Home Equipment: Hand held shower head          Prior Functioning/Environment Prior Level of Function : Independent/Modified Independent;Driving;Working/employed               ADLs Comments: Works as a merchandiser, 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 [employer] Problem List: Decreased strength;Decreased activity tolerance;Impaired balance (sitting and/or standing);Decreased cognition;Decreased safety awareness;Decreased knowledge of use of DME or AE;Decreased knowledge of precautions;Cardiopulmonary status limiting activity   OT Treatment/Interventions: Self-care/ADL training;Therapeutic exercise;Energy conservation;DME and/or AE  instruction;Therapeutic activities;Cognitive remediation/compensation;Patient/family education;Balance training      OT Goals(Current goals can be found in the care plan section)   Acute Rehab OT Goals Patient Stated Goal: get better OT Goal Formulation: With patient/family Time For Goal Achievement: 10/30/24 Potential to Achieve Goals: Good   OT Frequency:  Min 2X/week    Co-evaluation PT/OT/SLP Co-Evaluation/Treatment: Yes Reason for Co-Treatment: Complexity of the patient's impairments (multi-system involvement);For patient/therapist safety;To address functional/ADL transfers   OT goals addressed during session: ADL's and self-care      AM-PAC OT 6 Clicks Daily Activity     Outcome Measure Help from another person eating meals?: A Little Help from another person taking care of personal grooming?: A Lot Help from another person toileting, which includes using toliet, bedpan, or urinal?: A Lot Help from another person bathing (including washing, rinsing, drying)?: A Lot Help from another person to put on and taking off regular upper body clothing?: A Lot Help from another person to put on and taking off regular lower body clothing?: A Lot 6 Click Score: 13   End of Session Equipment Utilized During Treatment: Oxygen  Nurse Communication: Mobility status  Activity Tolerance: Patient tolerated treatment well Patient left: in chair;with call bell/phone within reach;with chair alarm set;with family/visitor present  OT Visit Diagnosis: Unsteadiness on feet (R26.81);Other abnormalities of gait and mobility (R26.89);Muscle  weakness (generalized) (M62.81)                Time: 8677-8596 OT Time Calculation (min): 41 min Charges:  OT General Charges $OT Visit: 1 Visit OT Evaluation $OT Eval High Complexity: 1 High  Mong Neal, OT/L   Acute OT Clinical Specialist Acute Rehabilitation Services Pager [phone] Office [phone]   [facility] 10/16/2024, 3:35 PM

## 2024-10-16 NOTE — Progress Notes (Signed)
" °  Echocardiogram 2D Echocardiogram has been performed.  Koleen KANDICE Popper, RDCS 10/16/2024, 10:37 AM "

## 2024-10-16 NOTE — Progress Notes (Signed)
 PHARMACY - ANTICOAGULATION CONSULT NOTE  Pharmacy Consult for warfarin Indication: LVAD HM3 + hx AFib  Allergies[1]  Patient Measurements: Height: 6' 6 (198.1 cm) Weight: 106.8 kg (235 lb 7.2 oz) IBW/kg (Calculated) : 91.4 HEPARIN  DW (KG): 104.3  Vital Signs: Temp: 99.3 F (37.4 C) (01/02 0830) Temp Source: Core (01/02 0400) BP: 92/81 (01/02 0500) Pulse Rate: 86 (01/02 0830)  Labs: Recent Labs    10/13/24 1643 10/14/24 0410 10/14/24 0649 10/14/24 1424 10/14/24 2000 10/15/24 0400 10/15/24 0557 10/15/24 1613 10/15/24 1614 10/16/24 0414 10/16/24 0451  HGB  --  11.3*   < > 8.7*   < > 7.8*   < > 8.6* 9.2* 8.6* 9.2*  HCT  --  32.0*   < > 25.6*   < > 22.7*   < > 24.9* 27.0* 25.4* 27.0*  PLT  --  66*   < > 79*  77*   < > 94*  --  95*  --  124*  --   APTT 50* 49*  --  34  34  --   --   --   --   --   --   --   LABPROT  --   --   --  16.3*  16.0*  --  15.7*  --   --   --  16.4*  --   INR  --   --   --  1.2  1.2  --  1.2  --   --   --  1.3*  --   CREATININE  --  1.60*   < > 1.50*   < > 1.55*  --  1.59*  --  1.62*  --    < > = values in this interval not displayed.    Estimated Creatinine Clearance: 60.3 mL/min (A) (by C-G formula based on SCr of 1.62 mg/dL (H)).   Medical History: Past Medical History:  Diagnosis Date   CKD (chronic kidney disease) stage 2, GFR 60-89 ml/min    COPD (chronic obstructive pulmonary disease) (HCC)    Diabetes mellitus type II, non insulin  dependent (HCC)    Dilated aortic root    Elevated PSA    Hypercholesteremia    Hypertension    NICM (nonischemic cardiomyopathy) (HCC)    NSVT (nonsustained ventricular tachycardia) (HCC)    Obstructive sleep apnea    Paroxysmal atrial fibrillation (HCC)    Pulmonary hypertension (HCC)    PVC's (premature ventricular contractions)    Tobacco abuse     Medications:  Scheduled:   acetaminophen   1,000 mg Oral Q6H   Or   acetaminophen  (TYLENOL ) oral liquid 160 mg/5 mL  1,000 mg Per Tube Q6H    bisacodyl   10 mg Oral Daily   Or   bisacodyl   10 mg Rectal Daily   Chlorhexidine  Gluconate Cloth  6 each Topical Daily   docusate sodium  100 mg Oral Daily   gabapentin  200 mg Oral QHS   insulin  aspart  0-24 Units Subcutaneous Q4H   insulin  glargine  10 Units Subcutaneous Daily   melatonin  3 mg Oral QHS   metoCLOPramide (REGLAN) injection  10 mg Intravenous Q6H   mexiletine  200 mg Oral Q8H   pantoprazole   40 mg Oral Daily   polyethylene glycol  17 g Oral Daily   potassium chloride   40 mEq Oral BID   senna  2 tablet Oral Daily   sodium chloride  flush  10-40 mL Intracatheter Q12H   sodium chloride  flush  3 mL Intravenous Q12H   warfarin  2.5 mg Oral ONCE-1600   Warfarin - Pharmacist Dosing Inpatient   Does not apply q1600    Assessment: 63 yom who underwent LVAD HM3 placement on 12/31. Was on apixaban  PTA for hx Afib > now with LVAD plan to change to warfarin.   INR today is 1.3. Hgb 9.2, plt increasing 124. LDH 703. No s/sx of bleeding. Oral intake is okay/improving (ate all of breakfast and ~30-40% of lunch).   Goal of Therapy:  INR 2-2.5 Monitor platelets by anticoagulation protocol: Yes   Plan:  Warfarin 2.5 mg tonight  Monitor daily INR, CBC, and for s/sx of bleeding   Thank you for allowing pharmacy to participate in this patient's care,  Suzen Sour, PharmD, BCCCP Clinical Pharmacist  Phone: [phone] 10/16/2024 1:41 PM  Please check AMION for all [facility] Pharmacy phone numbers After 10:00 PM, call Main Pharmacy [phone]      [1] No Known Allergies

## 2024-10-16 NOTE — Progress Notes (Signed)
 LVAD Coordinator Rounding Note:  Admitted 10/01/24 for optimization prior to LVAD implant. Impella 5.5 placed 10/06/24.  HM 3 LVAD implanted on 10/14/24 by Dr Lucas under destination therapy criteria.  12/18: Milrinone  titrated from 0.25 to 0.5 mcg/kg/min post RHC d/t low-output and pulmonary hypertension 12/19: NE added 12/22: Markedly elevated PA pressures, elevated PCWP and elevated SVR. Titrated off NE. Added DBA.  12/23: Impella 5.5 Implanted.  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 Heparin  > Bival.  12/31: HM III LVAD implant  Pt laying in bed on my arrival. Echo at bedside. Speed increased to 5300 this morning per Dr Zenaida. Denies complaints other than post-op soreness.  Pt's wife at bedside. She observed drive line dressing change today. See documentation below.   Vital signs: Temp: 99.1 HR: 69 Doppler Pressure: 90 Arterial BP: 90/71 (77) Automatic BP: not documented O2 Sat: 98% Wt: 235.4 lb    LVAD interrogation reveals:  Speed: 5300 Flow: 4.9 Power: 4.0 PI: 3.5   Alarms: none Events:  none Hematocrit: 25  Fixed speed: 5300 Low speed limit: 5000   Drive Line: Existing VAD dressing removed and site care performed using sterile technique. Drive line exit site cleaned with Chlora prep applicators x 2, allowed to dry, and gauze dressing with Silverlon patch applied. Exit site unincorporated, the velour is fully implanted at exit site. Moderate amount of serosanguinous drainage present on previous dressing. No redness, tenderness, foul odor or rash noted. Cath grip anchor re-applied. Continue daily dressing changes by VAD coordinator or nurse champion. Next dressing change due 10/17/24.      Labs:  LDH trend: 703  INR trend: 1.3  Anticoagulation Plan: - INR Goal: 2.0 - 2.5 - Coumadin dosing per pharmacy  Blood Products:  Intra-op 10/14/24:   4 FFP  2 platelets  700 cellsaver  DDAVP x 2 Post-op:  10/15/24: 1 PRBC  Device: - Biotronik -Therapies: OFF  Arrythmias:  PVCs/NSVT- currently on Amiodarone  drip. On Mexiletine 200 mg TID.  Respiratory: extubated 10/15/24  Infection:   Renal:  - BUN/CRT: 24/1.62  Drips:  Epinephrine  4 mcg/min Milrinone  0.25 mcg/kg/min Amiodarone  30 mg/hr  Adverse Events on VAD:  Patient Education:  Drive line dressing change observed by pt's wife [name] [name] practiced don/doff sterile gloves Discussed importance of cap/mask with dressing changes  Plan/Recommendations:  1. Page VAD coordinator for any equipment or drive line concerns 2. Daily drive line dressing changes by VAD coordinator or nurse champion  Isaiah Knoll RN VAD Coordinator  Office: [phone]  24/7 Pager: [phone]

## 2024-10-16 NOTE — Progress Notes (Addendum)
 Was contacted by the patients nurse for concerns for decreased saturation of the coox of 52.1. Denied any new or worsening symptoms. Reviewing chart the patient had a normal arterial O2 sat of 99% and Bicarb of 21.3 1 hour earlier. SPO2 has also consistently been in the high 90's. The patients nurse confirmed the source of the sample was the coox was the pulmonary artery which explains why it is low.   Signed,  Morse Clause, PA-C 10/16/2024, 7:13 AM

## 2024-10-16 NOTE — Progress Notes (Signed)
 LUE venous duplex has been completed.    Results can be found under chart review under CV PROC. 10/16/2024 10:03 AM Tacora Athanas RVT, RDMS

## 2024-10-16 NOTE — Progress Notes (Signed)
 "  NAME:  James [name], MRN:  [id number], DOB:  08/11/1961, LOS: 15 ADMISSION DATE:  10/01/2024, CONSULTATION DATE:  10/14/24 REFERRING MD:  Lucas , CHIEF COMPLAINT:  s/p LVAD   History of Present Illness:  64 yo M with stg D cardiomyopathy + inotrope dependence, CKD 3a, hx VT, pAFib, meningioma who was admitted to HF service 10/01/24 for pre-VAD optimization  This included: RHC, milrinone  titration, impella placement, levo and DBA titration and volume optimization  On 10/14/24 he went for LVAD  Pump: 116 min  EBL: 750 Product: 2 plt 4 ffp 708 cellsaver  PCCM is consulted post op ICU management.  Pertinent  Medical History  HFrEF Inotrope dependence VT pAFib  Prostate cancer Meningioma  HTN DM2   Significant Hospital Events: Including procedures, antibiotic start and stop dates in addition to other pertinent events    10/01/24 RHC w low output and pHTN, and subsequent uptitration of milrinone  from 0.25 to 0.5 10/02/24 started ND 10/05/24 high PA pressures, hignPCWP high SVR. 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 Hep to bival 10/13/24 not likely HIT. Milrinone  to 0.375  10/14/24 LVAD. PCCM consult.  10/26/23 extubated, nitric off, 1u prbc, lasix   Interim History / Subjective:  Coox down this morning increasing milrinone , epi @ 4. Will recheck. TCTS okay with chest tubes out today. LUE swelling on exam > getting ultrasound. He is complaining of a fluttering feeling in his chest, no significant ectopy, may be him just getting used to LVAD.   Objective    Blood pressure 92/81, pulse 86, temperature 99.3 F (37.4 C), resp. rate 16, height 6' 6 (1.981 m), weight 106.8 kg, SpO2 95%. PAP: (27-96)/(5-56) 58/17 CVP:  [0 mmHg-31 mmHg] 8 mmHg CO:  [4.5 L/min-6.6 L/min] 4.5 L/min CI:  [1.92 L/min/m2-2.8 L/min/m2] 1.92 L/min/m2  FiO2 (%):  [36 %]  36 %   Intake/Output Summary (Last 24 hours) at 10/16/2024 0851 Last data filed at 10/16/2024 0800 Gross per 24 hour  Intake 2401.53 ml  Output 2465 ml  Net -63.47 ml   Filed Weights   10/14/24 0610 10/15/24 0500 10/16/24 0500  Weight: 99 kg 107.7 kg 106.8 kg    Examination: General: acute on chronically ill appearing male, no distress  HENT: ncat, anicteric sclera, mucous membranes moist Lungs: 3L [place], lungs clear and unlabored  Cardiovascular: LVAD hum present, otherwise difficult to auscultate underlying sounds  Abdomen: rounded, soft  Extremities: warm, 1+ edema  Neuro: awake, alert, little foggy, following commands  GU: foley   LVAD  Speed 5250, flow 4.8L, power 3.5, PI 3.4  Milrinone  0.25 Epi 4 Amio 60   CVP 15, PA 65/25, CI 1.9, coox 52%  Resolved problem list   Assessment and Plan   Acute on chronic HFrEF, stage D cardiomyopathy s/p LVAD (HM3 10/14/24)  pHTN, acute on chronic RV failure   PVCs, NSVT  pAFib  -post-op care per TCTS -remove mediastinal chest tubes today and blake drain from impella site  - coumadin this evening per pharmacy  - coox low this morning> milrinone  0.375, epi 4 - recheck coox and titrate per AHF  - amiodarone  @30   - con't mexiletine 200mg  q8h  - lasix  120mg  TID  - complete perioperative ancef   - mobility as able   Acute hypoxic respiratory failure 2/2 pulmonary edema, deconditioning  - O2 wean for sat >92%  - mobility -> oob if tolerates  -  incentive spirometry   LUE swelling  - obtain venous duplex DVT study   Leukocytosis  May be all post-operative, critical illness reactivity; no true fevers  - trend fever, wbc curves  - still on perioperative antibiotics   CKD 3a Hyponatremia  Nephro labs stable.  Hyponatremia ongoing 130>128, may be now oral water  intake contributing in addition to his HF pathophys  - monitor  - drink solutes in addition to water  I.e. gatorade etc.  - renal perfusion, renally dose meds, elyte  replacement, strict I/O  DM2 w hyperglycemia  - ssi resistant scale  - add lantus 10 units daily  - cbg q4h   Anemia; expected operative blood loss.  Thrombocytopenia; not HIT - trend  - transfuse prn   Labs   CBC: Recent Labs  Lab 10/14/24 1424 10/14/24 2000 10/14/24 2005 10/15/24 0400 10/15/24 0557 10/15/24 1613 10/15/24 1614 10/16/24 0414 10/16/24 0451  WBC 18.2* 19.0*  --  13.1*  --  15.7*  --  21.5*  --   NEUTROABS  --   --   --  11.6*  --   --   --  19.3*  --   HGB 8.7* 8.1*   < > 7.8* 7.5* 8.6* 9.2* 8.6* 9.2*  HCT 25.6* 23.8*   < > 22.7* 22.0* 24.9* 27.0* 25.4* 27.0*  MCV 91.8 93.3  --  92.3  --  90.5  --  92.4  --   PLT 79*  77* 87*  --  94*  --  95*  --  124*  --    < > = values in this interval not displayed.    Basic Metabolic Panel: Recent Labs  Lab 10/13/24 0514 10/13/24 0515 10/14/24 1424 10/14/24 2000 10/14/24 2005 10/15/24 0400 10/15/24 0557 10/15/24 1613 10/15/24 1614 10/16/24 0414 10/16/24 0451  NA  --    < > 132* 131*   < > 133* 133* 130* 130* 128* 130*  K  --    < > 3.7 4.0   < > 4.0 3.6 3.7 3.6 4.0 3.7  CL  --    < > 98 97*  --  99  --  95*  --  94*  --   CO2  --    < > 26 22  --  22  --  21*  --  22  --   GLUCOSE  --    < > 72 167*  --  119*  --  129*  --  154*  --   BUN  --    < > 23 24*  --  24*  --  22  --  24*  --   CREATININE  --    < > 1.50* 1.59*  --  1.55*  --  1.59*  --  1.62*  --   CALCIUM   --    < > 9.3 9.1  --  9.2  --  8.8*  --  9.0  --   MG  --    < > 1.8 2.5*  --  2.3  --  1.9  --  2.0  --   PHOS 1.9*  --   --   --   --  3.3  --   --   --  4.4  --    < > = values in this interval not displayed.   GFR: Estimated Creatinine Clearance: 60.3 mL/min (A) (by C-G formula based on SCr of 1.62 mg/dL (H)). Recent Labs  Lab 10/14/24 2000  10/15/24 0400 10/15/24 1613 10/16/24 0414  WBC 19.0* 13.1* 15.7* 21.5*    Liver Function Tests: Recent Labs  Lab 10/10/24 1237 10/15/24 0400 10/16/24 0414  AST 42* 82* 102*  ALT  20 16 15   ALKPHOS 119 90 87  BILITOT 1.5* 1.3* 1.2  PROT 6.5 5.3* 5.9*  ALBUMIN  3.7 3.4* 3.6     ABG    Component Value Date/Time   PHART 7.393 10/16/2024 0451   PCO2ART 35.1 10/16/2024 0451   PO2ART 118 (H) 10/16/2024 0451   HCO3 21.3 10/16/2024 0451   TCO2 22 10/16/2024 0451   ACIDBASEDEF 3.0 (H) 10/16/2024 0451   O2SAT 52.1 10/16/2024 0545     Critical care time: 35   The patient is critically ill with multiple organ system failure and requires high complexity decision making for assessment and support, frequent evaluation and titration of therapies, advanced monitoring, review of radiographic studies and interpretation of complex data.    Critical Care Time devoted to patient care services, exclusive of separately billable procedures, described in this note is 39  Tinnie FORBES Adolph DEVONNA [facility] 10/16/2024 1:02 PM  Please see [email or web address] for pager details. From 7A-7P if no response, please call [phone]           "

## 2024-10-16 NOTE — Progress Notes (Signed)
 HeartMate 3 Rounding Note  Subjective:    Hemodynamics stable overnight on milrinone  0.25 and epi 4. CI 1.92, Co-ox down to 52 this am.  PAP 57/15 CVP 8   I=O yesterday. Wt down 2 lbs. Still 22 lbs over preop.  Ate breakfast.  LVAD INTERROGATION:  HeartMate IIl LVAD:  Flow 4.8 liters/min, speed 5200, power 3.4, PI 3.3.    Objective:    Vital Signs:   Temp:  [99 F (37.2 C)-100.2 F (37.9 C)] 99.5 F (37.5 C) (01/02 0715) Pulse Rate:  [74-226] 84 (01/02 0715) Resp:  [9-26] 17 (01/02 0715) BP: (77-145)/(59-104) 92/81 (01/02 0500) SpO2:  [80 %-100 %] 95 % (01/02 0715) Arterial Line BP: (74-145)/(54-103) 103/60 (01/02 0715) FiO2 (%):  [36 %] 36 % (01/01 0951) Weight:  [106.8 kg] 106.8 kg (01/02 0500) Last BM Date : 10/13/24 Mean arterial Pressure 70's  Intake/Output:   Intake/Output Summary (Last 24 hours) at 10/16/2024 0806 Last data filed at 10/16/2024 0700 Gross per 24 hour  Intake 2369.92 ml  Output 2465 ml  Net -95.08 ml     Physical Exam: General:  Well appearing. No resp difficulty HEENT: normal Cor: distant heart sounds with LVAD hum present. Rub present from tubes Lungs: clear Abdomen: soft, nontender, nondistended. Good bowel sounds. Extremities: moderate edema Neuro: alert & orientedx3, cranial nerves grossly intact. moves all 4 extremities w/o difficulty. Affect pleasant  Telemetry: sinus 71. Pacer turned off.  Labs: Basic Metabolic Panel: Recent Labs  Lab 10/13/24 0514 10/13/24 0515 10/14/24 1424 10/14/24 2000 10/14/24 2005 10/15/24 0400 10/15/24 0557 10/15/24 1613 10/15/24 1614 10/16/24 0414 10/16/24 0451  NA  --    < > 132* 131*   < > 133* 133* 130* 130* 128* 130*  K  --    < > 3.7 4.0   < > 4.0 3.6 3.7 3.6 4.0 3.7  CL  --    < > 98 97*  --  99  --  95*  --  94*  --   CO2  --    < > 26 22  --  22  --  21*  --  22  --   GLUCOSE  --    < > 72 167*  --  119*  --  129*  --  154*  --   BUN  --    < > 23 24*  --  24*  --  22  --  24*  --    CREATININE  --    < > 1.50* 1.59*  --  1.55*  --  1.59*  --  1.62*  --   CALCIUM   --    < > 9.3 9.1  --  9.2  --  8.8*  --  9.0  --   MG  --    < > 1.8 2.5*  --  2.3  --  1.9  --  2.0  --   PHOS 1.9*  --   --   --   --  3.3  --   --   --  4.4  --    < > = values in this interval not displayed.    Liver Function Tests: Recent Labs  Lab 10/10/24 1237 10/15/24 0400 10/16/24 0414  AST 42* 82* 102*  ALT 20 16 15   ALKPHOS 119 90 87  BILITOT 1.5* 1.3* 1.2  PROT 6.5 5.3* 5.9*  ALBUMIN  3.7 3.4* 3.6   No results for input(s): LIPASE, AMYLASE in the last 168  hours. No results for input(s): AMMONIA in the last 168 hours.  CBC: Recent Labs  Lab 10/14/24 1424 10/14/24 2000 10/14/24 2005 10/15/24 0400 10/15/24 0557 10/15/24 1613 10/15/24 1614 10/16/24 0414 10/16/24 0451  WBC 18.2* 19.0*  --  13.1*  --  15.7*  --  21.5*  --   NEUTROABS  --   --   --  11.6*  --   --   --  19.3*  --   HGB 8.7* 8.1*   < > 7.8* 7.5* 8.6* 9.2* 8.6* 9.2*  HCT 25.6* 23.8*   < > 22.7* 22.0* 24.9* 27.0* 25.4* 27.0*  MCV 91.8 93.3  --  92.3  --  90.5  --  92.4  --   PLT 79*  77* 87*  --  94*  --  95*  --  124*  --    < > = values in this interval not displayed.    INR: Recent Labs  Lab 10/14/24 1424 10/15/24 0400 10/16/24 0414  INR 1.2  1.2 1.2 1.3*    Other results: EKG:   Imaging: DG Chest Port 1 View Result Date: 10/15/2024 CLINICAL DATA:  Left ventricular assist device present. EXAM: PORTABLE CHEST 1 VIEW COMPARISON:  Radiograph yesterday FINDINGS: Stable positioning of endotracheal tube, left internal jugular Swan-Ganz catheter, left-sided chest tube and mediastinal drain. Enteric tube is difficult to accurately delineate, tip below the diaphragm. Left ventricular assist device in place, partially included in the field of view. Left-sided pacemaker. Stable cardiomegaly post median sternotomy. Similar pulmonary edema. No visible pneumothorax. Mild bibasilar atelectasis. No large pleural  effusion. IMPRESSION: 1. Stable support apparatus. 2. Similar pulmonary edema and bibasilar atelectasis. Electronically Signed   By: Andrea Gasman M.D.   On: 10/15/2024 10:27   DG Chest Port 1 View Result Date: 10/14/2024 EXAM: 1 VIEW(S) XRAY OF THE CHEST 10/14/2024 02:28:00 PM COMPARISON: 10/06/2024 CLINICAL HISTORY: LVAD (left ventricular assist device) present (HCC) FINDINGS: LINES, TUBES AND DEVICES: Left chest tube in place. Mediastinal and left chest drains in place. Left IJ PA catheter in place with tip in expected area of right pulmonary artery. Endotracheal tube in place with tip 6 cm above carina. Enteric tube partially visualized coursing below diaphragm. LUNGS AND PLEURA: Mild pulmonary edema. Bibasilar atelectasis. No pleural effusion. No pneumothorax. HEART AND MEDIASTINUM: LVAD in place. Left chest ICD noted. Stable cardiomegaly. BONES AND SOFT TISSUES: Interval sternotomy noted. IMPRESSION: 1. LVAD in place. 2. Mild pulmonary edema and bibasilar atelectasis. Electronically signed by: Taylor Stroud MD 10/14/2024 02:55 PM EST RP Workstation: [id number]     Medications:     Scheduled Medications:  acetaminophen   1,000 mg Oral Q6H   Or   acetaminophen  (TYLENOL ) oral liquid 160 mg/5 mL  1,000 mg Per Tube Q6H   bisacodyl   10 mg Oral Daily   Or   bisacodyl   10 mg Rectal Daily   Chlorhexidine  Gluconate Cloth  6 each Topical Daily   docusate sodium  100 mg Oral Daily   gabapentin  200 mg Oral QHS   insulin  aspart  0-24 Units Subcutaneous Q4H   melatonin  3 mg Oral QHS   metoCLOPramide (REGLAN) injection  10 mg Intravenous Q6H   mexiletine  200 mg Oral Q8H   pantoprazole   40 mg Oral Daily   polyethylene glycol  17 g Oral Daily   potassium chloride   40 mEq Oral BID   sodium chloride  flush  10-40 mL Intracatheter Q12H   sodium chloride  flush  3 mL Intravenous  Q12H    Infusions:  amiodarone  30 mg/hr (10/16/24 0700)    ceFAZolin  (ANCEF ) IV Stopped (10/16/24 0650)   epinephrine   4 mcg/min (10/16/24 0700)   furosemide      milrinone  0.25 mcg/kg/min (10/16/24 0700)   norepinephrine  (LEVOPHED ) Adult infusion Stopped (10/15/24 1258)    PRN Medications: dextrose , morphine injection, ondansetron  (ZOFRAN ) IV, mouth rinse, oxyCODONE , phenol, sodium chloride  flush, sodium chloride  flush, traMADol, traZODone   Assessment:   POD 2 HM 3 LVAD and removal of Impella 5.5 for Stage D NICM, LVEF < 20% with NYHA class IV symptoms on milrinone  at home. Preop right axillary Impella 5.5 for optimization due to low output and Co-ox on milrinone  alone.   Moderate to severe pulmonary HTN preop improved with Impella and diuresis. Much better postop after LVAD.    Preop AKI on stage 3a CKD due to low output. Improved with Impella and milrinone .   Hx of PVC's and NSVT. Has Biotronik ICD with therapies still off periop.   PAF, maintaining NSR on IV amio.   Preop thrombocytopenia, HIT negative. Improved postop with plt transfusion. Continue to follow.    Expected acute postop blood loss anemia.  Plan/Discussion:    Expand All Collapse All  HeartMate 3 Rounding Note   Subjective:     Stable night on vent. Milrinone  0.375, NE down to 3, epi 6  CI 2.5, PA 31/12 CVP 3, Co-ox 76     LVAD INTERROGATION:  HeartMate IIl LVAD:  Flow 4/8 liters/min, speed 5200, power 3.4, PI 3.5.     Objective:     Vital Signs:                 Temp:  [96.8 F (36 C)-99.5 F (37.5 C)] 99.5 F (37.5 C) (01/01 0800) Pulse Rate:  [70-210] 81 (01/01 0800) Resp:  [14-21] 18 (01/01 0800) BP: (80-89)/(56-78) 87/74 (01/01 0800) SpO2:  [94 %-100 %] 100 % (01/01 0800) Arterial Line BP: (75-194)/(37-162) 93/63 (01/01 0800) FiO2 (%):  [50 %-100 %] 50 % (01/01 0800) Weight:  [107.7 kg] 107.7 kg (01/01 0500) Last BM Date : 10/13/24 Mean arterial Pressure 70's   Intake/Output:              Intake/Output Summary (Last 24 hours) at 10/15/2024 0823 Last data filed at 10/15/2024 0800    Gross per 24 hour   Intake 8945.3 ml  Output 4635 ml  Net 4310.3 ml                 Physical Exam: General:  Well appearing on vent HEENT: intubated Cor: Distant heart sounds with LVAD hum present. Lungs: clear Abdomen: soft, nontender, non-distended, few bowel sounds. Extremities: moderate edema Neuro: alert & orientedx3, cranial nerves grossly intact. moves all 4 extremities w/o difficulty. Affect pleasant   Telemetry: sinus 65 under pacer. Atrial paced at 80.   Labs: Basic Metabolic Panel: Last Labs                Recent Labs  Lab 10/13/24 0514 10/13/24 0515 10/14/24 0410 10/14/24 0649 10/14/24 1116 10/14/24 1203 10/14/24 1207 10/14/24 1419 10/14/24 1424 10/14/24 2000 10/15/24 0400 10/15/24 0557  NA  --  128* 129*   < > 132*   < > 133* 134* 132* 131* 133* 133*  K  --  3.8 3.7   < > 3.5   < > 3.4* 3.6 3.7 4.0 4.0 3.6  CL  --  95* 95*   < > 92*  --  93*  --  98 97* 99  --   CO2  --  24 25  --   --   --   --   --  26 22 22   --   GLUCOSE  --  192* 115*   < > 173*  --  154*  --  72 167* 119*  --   BUN  --  27* 27*   < > 24*  --  25*  --  23 24* 24*  --   CREATININE  --  1.67* 1.60*   < > 1.60*  --  1.50*  --  1.50* 1.59* 1.55*  --   CALCIUM   --  8.8* 8.6*  --   --   --   --   --  9.3 9.1 9.2  --   MG  --  1.7 2.0  --   --   --   --   --  1.8 2.5* 2.3  --   PHOS 1.9*  --   --   --   --   --   --   --   --   --  3.3  --    < > = values in this interval not displayed.        Liver Function Tests: Last Labs      Recent Labs  Lab 10/10/24 1237 10/15/24 0400  AST 42* 82*  ALT 20 16  ALKPHOS 119 90  BILITOT 1.5* 1.3*  PROT 6.5 5.3*  ALBUMIN  3.7 3.4*      Last Labs  No results for input(s): LIPASE, AMYLASE in the last 168 hours.   Last Labs  No results for input(s): AMMONIA in the last 168 hours.     CBC: Last Labs              Recent Labs  Lab 10/13/24 0515 10/14/24 0410 10/14/24 0649 10/14/24 1047 10/14/24 1116 10/14/24 1419 10/14/24 1424 10/14/24 2000  10/15/24 0400 10/15/24 0557  WBC 8.0 6.9  --   --   --   --  18.2* 19.0* 13.1*  --   NEUTROABS  --   --   --   --   --   --   --   --  11.6*  --   HGB 12.1* 11.3*   < > 9.0*   < > 9.5* 8.7* 8.1* 7.8* 7.5*  HCT 34.5* 32.0*   < > 25.7*   < > 28.0* 25.6* 23.8* 22.7* 22.0*  MCV 89.4 88.9  --   --   --   --  91.8 93.3 92.3  --   PLT 62* 66*  --  62*  --   --  79*  77* 87* 94*  --    < > = values in this interval not displayed.        INR: Last Labs      Recent Labs  Lab 10/14/24 1424 10/15/24 0400  INR 1.2  1.2 1.2        Other results: EKG:    Imaging: Imaging Results (Last 48 hours)  ECHO INTRAOPERATIVE TEE Result Date: 10/14/2024  *INTRAOPERATIVE TRANSESOPHAGEAL REPORT *  Patient Name:   BECKER CHRISTOPHER Date of Exam: 10/14/2024 Medical Rec #:  [id number]         Height:       78.0 in Accession #:    [id number]        Weight:       218.3 lb Date of Birth:  [birth year]         BSA:          2.34 m Patient Age:    63 years          BP:           128/78 mmHg Patient Gender: M                 HR:           60 bpm. Exam Location:  Inpatient Transesophogeal exam was perform intraoperatively during surgical procedure. Patient was closely monitored under general anesthesia during the entirety of examination. Indications:     LVAD insertion Performing Phys: [id number] K Leovanni Bjorkman Diagnosing Phys: Cordella Stoltzfus Complications: No known complications during this procedure. POST-OP IMPRESSIONS _ Right Ventricle: The right ventricle appears unchanged from pre-bypass. _ Aortic Valve: There is trivial regurgitation. _ Mitral Valve: There is mild regurgitation. _ Tricuspid Valve: The tricuspid valve appears unchanged from pre-bypass. _ Pulmonic Valve: The pulmonic valve appears unchanged from pre-bypass. _ Pericardium: The pericardium appears unchanged from pre-bypass. _ Comments: S/p HM III implant. Emergence from CPB on NE and Epi support alongside milrinone  and INO for right support.  Left  Ventricular Assist Device; Ramp Study: The aortic valve opens frequently consistent with significant native left ventricular ejection. The inflow cannula was visualized and No evidence of obstruction to flow of the inflow cannula or outflow cannulas, doppler velocities WNL (inflow .47m/s, outflow .33m/s). LVAD function appears to be normal. Ramp from 4200rpm to 5100rpm at time of OR exit. Mild left to right IV septal bulging post placement.  PRE-OP FINDINGS  Left Ventricle: The left ventricle has severely reduced systolic function, with an ejection fraction of <20%. The cavity size was severely dilated. Left ventricular diffuse hypokinesis. Impella 5.5 in situ appropriately positioned ~5.3cm from AA. no LV thrombus Right Ventricle: The right ventricle has mildly reduced systolic function. The cavity was normal. There is no increase in right ventricular wall thickness. Right ventricular systolic pressure is moderately elevated. Left Atrium: Left atrial size was dilated. No left atrial/left atrial appendage thrombus was detected. There is echo contrast seen in the left atrial cavity and left atrial appendage. Left atrial appendage velocity is reduced at less than 40 cm/s. Right Atrium: Right atrial size was normal in size. There is spontaneous echo contrast seen in the right atrial cavity. Interatrial Septum: No atrial level shunt detected by color flow Doppler. There is no evidence of a patent foramen ovale. Pericardium: There is no evidence of pericardial effusion. There is no pleural effusion. Mitral Valve: The mitral valve is normal in structure. Mitral valve regurgitation is moderate by color flow Doppler. The MR jet is centrally-directed. There is no evidence of mitral valve vegetation. There is No evidence of mitral stenosis. Tricuspid Valve: The tricuspid valve was normal in structure. Tricuspid valve regurgitation is mild by color flow Doppler. The jet is directed centrally. No evidence of tricuspid stenosis  is present. There is no evidence of tricuspid valve vegetation. Aortic Valve: The aortic valve is tricuspid Aortic valve regurgitation mild 2/2 in situ impella. There is no stenosis of the aortic valve. There is no evidence of aortic valve vegetation. Pulmonic Valve: The pulmonic valve was normal in structure No evidence of pumonic stenosis. Pulmonic valve regurgitation is mild by color flow Doppler. Aorta: The ascending aorta are normal in size and structure. There is mild dilatation of the aortic root, measuring 43 mm. Pulmonary Artery: [name] Purl catheter present on  the left. The pulmonary artery is of normal size. Shunts: There is no evidence of an atrial septal defect.  Cordella Fix Electronically signed by Cordella Fix Signature Date/Time: 10/14/2024/5:14:16 PM    Final     DG Chest Port 1 View Result Date: 10/14/2024 EXAM: 1 VIEW(S) XRAY OF THE CHEST 10/14/2024 02:28:00 PM COMPARISON: 10/06/2024 CLINICAL HISTORY: LVAD (left ventricular assist device) present (HCC) FINDINGS: LINES, TUBES AND DEVICES: Left chest tube in place. Mediastinal and left chest drains in place. Left IJ PA catheter in place with tip in expected area of right pulmonary artery. Endotracheal tube in place with tip 6 cm above carina. Enteric tube partially visualized coursing below diaphragm. LUNGS AND PLEURA: Mild pulmonary edema. Bibasilar atelectasis. No pleural effusion. No pneumothorax. HEART AND MEDIASTINUM: LVAD in place. Left chest ICD noted. Stable cardiomegaly. BONES AND SOFT TISSUES: Interval sternotomy noted. IMPRESSION: 1. LVAD in place. 2. Mild pulmonary edema and bibasilar atelectasis. Electronically signed by: Taylor Stroud MD 10/14/2024 02:55 PM EST RP Workstation: [id number]         Medications:       Scheduled Medications:  acetaminophen   1,000 mg Oral Q6H    Or   acetaminophen  (TYLENOL ) oral liquid 160 mg/5 mL  1,000 mg Per Tube Q6H   bisacodyl   10 mg Oral Daily    Or   bisacodyl   10 mg Rectal  Daily   Chlorhexidine  Gluconate Cloth  6 each Topical Daily   docusate  200 mg Per Tube Daily   fentaNYL  (SUBLIMAZE ) injection  25-50 mcg Intravenous Once   gabapentin  200 mg Per Tube QHS   metoCLOPramide (REGLAN) injection  10 mg Intravenous Q6H   mouth rinse  15 mL Mouth Rinse Q2H   pantoprazole   40 mg Oral Daily   polyethylene glycol  17 g Per Tube Daily   sodium chloride  flush  10-40 mL Intracatheter Q12H   sodium chloride  flush  3 mL Intravenous Q12H          Infusions:  sodium chloride      sodium chloride      sodium chloride  10 mL/hr at 10/15/24 0800   albumin  human Stopped (10/14/24 2019)   amiodarone  30 mg/hr (10/15/24 0800)    ceFAZolin  (ANCEF ) IV Stopped (10/15/24 0540)   dexmedetomidine (PRECEDEX) IV infusion 0.3 mcg/kg/hr (10/15/24 0800)   epinephrine  6 mcg/min (10/15/24 0800)   fentaNYL  infusion INTRAVENOUS Stopped (10/15/24 0600)   insulin  1.8 Units/hr (10/15/24 0800)   lactated ringers      lactated ringers  20 mL/hr at 10/15/24 0800   milrinone  0.25 mcg/kg/min (10/15/24 0805)   norepinephrine  (LEVOPHED ) Adult infusion 3 mcg/min (10/15/24 0800)   rifampin (RIFADIN) 600 mg in sodium chloride  0.9 % 100 mL IVPB 600 mg (10/15/24 0811)   vancomycin Stopped (10/14/24 2211)          PRN Medications:  sodium chloride , albumin  human, dextrose , fentaNYL , morphine injection, ondansetron  (ZOFRAN ) IV, mouth rinse, oxyCODONE , sodium chloride  flush, sodium chloride  flush         Assessment:    POD 1 HM 3 LVAD and removal of Impella 5.5 for Stage D NICM, LVEF < 20% with NYHA class IV symptoms on milrinone  at home. Preop right axillary Impella 5.5 for optimization due to low output and Co-ox on milrinone  alone.   Moderate to severe pulmonary HTN preop improved with Impella and diuresis. Much better postop after LVAD.    Preop AKI on stage 3a CKD due to low output. Improved with Impella and milrinone .   Hx  of PVC's and NSVT. Has Biotronik ICD with therapies still off  periop.   PAF, maintaining NSR on IV amio.   Preop thrombocytopenia, HIT negative. Improved postop with plt transfusion. Continue to follow.    Expected acute postop blood loss anemia.   Plan/Discussion:     He is hemodynamically stable with normal LVAD function. Filling pressures up some today. Increase diuresis.   IS, OOB.   Inotropes per AHF team.   DC MT's and blake drain from Impella site. Keep left pleural and pump pocket drain today.    Start Coumadin this evening per pharmacy. Discussed on multidisciplinary rounds.      I reviewed the LVAD parameters from today, and compared the results to the patient's prior recorded data.  No programming changes were made.  The LVAD is functioning within specified parameters.    Length of Stay: [address]  Dorise POUR [facility] 10/16/2024, 8:06 AM

## 2024-10-16 NOTE — Progress Notes (Addendum)
 "    Advanced Heart Failure Rounding Note   [facility] Cardiologist: Dr. Zenaida  Patient Profile   James [name] is a 64 y.o. male with history of stage D cardiomyopathy/chronic systolic heart failure now end-stage w/ inotrope dependence, CKD IIIa, hx VT, PAF, prostate cancer, meningioma.   Admitted for LVAD implant/ pre-VAD HF optimization.   Significant events:   12/18: Milrinone  titrated from 0.25 to 0.5 mcg/kg/min post RHC d/t low-output and pulmonary hypertension 12/19: NE added 12/22: Markedly elevated PA pressures, elevated PCWP and elevated SVR. Titrated off NE. Added DBA.  12/23: Impella 5.5 Implanted.  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 Heparin  > Bival.  12/31: HM III LVAD implant  Subjective:    POD # 2  Extubated yesterday am and iNO weaned off overnight.  Currently on 4 Epi + 0.25 milrinone . Norepi weaned off yesterday afternoon.   Swan #s CVP 15 PA 66/19 (33) TD CO 4.5 TD CI 1.9 Co-ox 52%   HeartMate 3 VAD Equipment Check Pump Speed (RPM): 5200 RPM Pump Flow (LPM): 4.7 Power (Watts): 4 Watts Pulsatility Index: 3.5 Fixed Speed Limit: 5200 rpm Low Speed Limit: 4900 rpm Alarms: No alarms Auscultated: Normal expected humming Power Module Self-Test (Daily): Done System Controller Self-Test: Passed Patient Battery Source: [facility] / Wall unit Emergency Equipment at Bedside: Yes  Objective:     Weight Range: 106.8 kg Body mass index is 27.21 kg/m.   Vital Signs:   Temp:  [99 F (37.2 C)-100.2 F (37.9 C)] 99.3 F (37.4 C) (01/02 0600) Pulse Rate:  [74-226] 103 (01/02 0600) Resp:  [9-24] 14 (01/02 0600) BP: (77-145)/(59-104) 92/81 (01/02 0500) SpO2:  [80 %-100 %] 95 % (01/02 0600) Arterial Line BP: (74-145)/(54-95) 99/73 (01/02 0600) FiO2 (%):  [36 %-50 %] 36 % (01/01 0951) Weight:  [106.8 kg] 106.8 kg (01/02 0500) Last BM Date : 10/13/24  Weight change: Filed Weights   10/14/24 0610 10/15/24 0500 10/16/24 0500  Weight: 99 kg 107.7 kg 106.8 kg    Intake/Output:  Intake/Output Summary (Last 24 hours) at 10/16/2024 9297 Last data filed at 10/16/2024 9365 Gross per 24 hour  Intake 2411.46 ml  Output 2525 ml  Net -113.54 ml    Physical Exam   Physical Exam: GENERAL: Sitting up in bed eating breakfast. No distress. CARDIAC:  Mechanical heart sounds with LVAD hum present. Sternal dressing LUNGS:  Clear anteriorly.  ABDOMEN:  Soft, mildly distended, non-tender LVAD exit site:   Dressing dry and intact.  Stabilization device present and accurately applied.   EXTREMITIES:  1-2+ generalized edema NEUROLOGIC:  Alert and oriented x 4. Affect pleasant.       Telemetry   V paced 80, PVCs  Labs   CBC Recent Labs    10/15/24 0400 10/15/24 0557 10/15/24 1613 10/15/24 1614 10/16/24 0414 10/16/24 0451  WBC 13.1*  --  15.7*  --  21.5*  --   NEUTROABS 11.6*  --   --   --  19.3*  --   HGB 7.8*   < > 8.6*   < > 8.6* 9.2*  HCT 22.7*   < > 24.9*   < > 25.4* 27.0*  MCV 92.3  --  90.5  --  92.4  --   PLT 94*  --  95*  --  124*  --    < > = values in this interval not displayed.   Basic Metabolic Panel Recent Labs    98/98/73 0400 10/15/24 0557 10/15/24 1613 10/15/24 1614 10/16/24 0414 10/16/24  0451  NA 133*   < > 130*   < > 128* 130*  K 4.0   < > 3.7   < > 4.0 3.7  CL 99  --  95*  --  94*  --   CO2 22  --  21*  --  22  --   GLUCOSE 119*  --  129*  --  154*  --   BUN 24*  --  22  --  24*  --   CREATININE 1.55*  --  1.59*  --  1.62*  --   CALCIUM  9.2  --  8.8*  --  9.0  --   MG 2.3  --  1.9  --  2.0  --   PHOS 3.3  --   --   --  4.4  --    < > = values in this interval not displayed.   Liver Function Tests Recent Labs    10/15/24 0400 10/16/24 0414  AST 82* 102*  ALT 16 15  ALKPHOS 90 87  BILITOT 1.3* 1.2  PROT 5.3* 5.9*  ALBUMIN  3.4* 3.6    BNP (last 3 results) Recent Labs    06/10/24 1213 07/09/24 1649 07/23/24 0959  BNP 1,071.5* 3,316.0* 595.8*   ProBNP (last 3 results) Recent Labs     10/01/24 1030 10/15/24 0400  PROBNP 8,374.0* 9,508.0*   Medications:    Scheduled Medications:  acetaminophen   1,000 mg Oral Q6H   Or   acetaminophen  (TYLENOL ) oral liquid 160 mg/5 mL  1,000 mg Per Tube Q6H   bisacodyl   10 mg Oral Daily   Or   bisacodyl   10 mg Rectal Daily   Chlorhexidine  Gluconate Cloth  6 each Topical Daily   docusate sodium  100 mg Oral Daily   gabapentin  200 mg Oral QHS   insulin  aspart  0-24 Units Subcutaneous Q4H   melatonin  3 mg Oral QHS   metoCLOPramide (REGLAN) injection  10 mg Intravenous Q6H   pantoprazole   40 mg Oral Daily   polyethylene glycol  17 g Oral Daily   sodium chloride  flush  10-40 mL Intracatheter Q12H   sodium chloride  flush  3 mL Intravenous Q12H    Infusions:  amiodarone  30 mg/hr (10/16/24 0600)    ceFAZolin  (ANCEF ) IV 2 g (10/16/24 0620)   epinephrine  4 mcg/min (10/16/24 0600)   milrinone  0.25 mcg/kg/min (10/16/24 0600)   norepinephrine  (LEVOPHED ) Adult infusion Stopped (10/15/24 1258)    PRN Medications: dextrose , morphine injection, ondansetron  (ZOFRAN ) IV, mouth rinse, oxyCODONE , phenol, sodium chloride  flush, sodium chloride  flush, traMADol, traZODone  Assessment/Plan   1. Acute on chronic systolic CHF, NYHA IV: Nonischemic cardiomyopathy, planned LVAD. RHC 12/18 showed elevated filling pressures with mod-severe mixed pulmonary arterial/pulmonary venous hypertension and low CO despite milrinone  0.25, increased to 0.5. Impella 5.5 placed 12/23 for further optimization. - S/p HM III LVAD implant by Dr. Lucas 10/14/24 - Currently on 0.25 milrinone  + 4 Epi. Norepi and iNO weaned off yesterday.  - TD CI 1.9 and co-ox 52%. SVR 1120. Continue current support. Discussed with Dr. Zenaida. - CVP 15. Up 17 lb from pre-op. Start IV lasix  120 TID. - LDH 575>703, LDH > 800 preVAD  2. Pulmonary hypertension: Severe combined pre/post capillary PH on RHC, suspect largely due to venous remodeling. Improved with adequate unloading. - PA  pressures higher today but has good pulsatility. Needs aggressive diuresis. Follow closely.  3.  AKI on CKD stage 3: Scr stable 1.6 - Diuresis as  above  4.  PVCs/NSVT: Exacerbated by inotrope support. Has Biotronik ICD.   - Continue IV amio. - Restart mexiletine 200 TID  - Tachy therapies off for VAD implant  5.  Paroxysmal Atrial fibrillation: Remains in NSR.  - Continue amiodarone  as above.  - Warfarin per TCTS  6. Meningioma: Monitoring.   7. Prostate cancer: Treated with radiation.   8. Thrombocytopenia: Slow fall in plts, suspect consumptive with impella.  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 No fevers.  - Follow  CRITICAL CARE Performed by: COLLETTA SHAVER N   Total critical care time: 20 minutes  Critical care time was exclusive of separately billable procedures and treating other patients.  Critical care was necessary to treat or prevent imminent or life-threatening deterioration.  Critical care was time spent personally by me on the following activities: development of treatment plan with patient and/or surrogate as well as nursing, discussions with consultants, evaluation of patient's response to treatment, examination of patient, obtaining history from patient or surrogate, ordering and performing treatments and interventions, ordering and review of laboratory studies, ordering and review of radiographic studies, pulse oximetry and re-evaluation of patient's condition.   Shaver Colletta, PA-C Advanced Heart Failure  Advanced Heart Failure Team Pager [phone] (M-F; 7a - 5p)   Please visit [email or web address]: For overnight coverage please call cardiology fellow first. If fellow not available call Shock/ECMO MD on call.  For ECMO / Mechanical Support (Impella, IABP, LVAD) issues call Shock / ECMO MD on call.  "

## 2024-10-16 NOTE — Evaluation (Signed)
 Physical Therapy Evaluation Patient Details Name: James [name] MRN: [id number] DOB: 08-21-1961 Today's Date: 10/16/2024  History of Present Illness  Pt is a 64 yo male who was admitted 10/01/24 for pre-VAD optimization prior to planned surgery. S/p R heart cath 12/18 & 12/24, impella placement 12/23, impella removal and HM III LVAD placement 12/31. Extubated 10/15/24. PMH: CKD IIIa, hx VT, PAF, prostate cancer, meningioma, COPD, DM2, HTN, hypercholesteremia, OSA, pulmonary hypertension, tobacco abuse   Clinical Impression  Pt presents with condition above and deficits mentioned below, see PT Problem List. PTA, he was independent without DME, driving, working, and living with his wife in a 1-level house with 4-6 STE. Currently, the pt displays some deficits in memory, attention, balance, gross strength, and activity tolerance. He is at risk for falls, currently needing minAx2 to transfer to stand and take a few pivotal steps with bil HHA. He needed modAx2 to transition supine to sit EOB as well. He may progress quickly once allowed to mobilize easier when some some lines are removed, but currently he could greatly benefit from intensive inpatient rehab, > 3 hours/day, since he has had a drastic functional decline and is motivated to return to being independent. Will plan to gradually educate pt on how to manage his LVAD in future sessions. This date as to not overwhelm pt with education, only educated him to ensure his drive line is anchored prior to mobilizing and that his controller is supported and connected to him when he mobilizes. Kept pt on the wall battery source throughout this session. Will continue to follow acutely.      If plan is discharge home, recommend the following: Two people to help with walking and/or transfers;Two people to help with bathing/dressing/bathroom;Assistance with cooking/housework;Direct supervision/assist for medications management;Direct supervision/assist for  financial management;Assist for transportation;Help with stairs or ramp for entrance;Supervision due to cognitive status   Can travel by private vehicle        Equipment Recommendations Rolling walker (2 wheels);BSC/3in1;Wheelchair (measurements PT);Wheelchair cushion (measurements PT) (pending progress; may progress to needing only a rollator and 3in1)  Recommendations for Other Services  Rehab consult    Functional Status Assessment Patient has had a recent decline in their functional status and demonstrates the ability to make significant improvements in function in a reasonable and predictable amount of time.     Precautions / Restrictions Precautions Precautions: Fall;Sternal;Other (comment) Precaution Booklet Issued: No Recall of Precautions/Restrictions: Impaired Precaution/Restrictions Comments: LVAD; temp pacer; Y-chest tube; A-line; [name] Purl Restrictions Weight Bearing Restrictions Per Provider Order: Yes Other Position/Activity Restrictions: sternal precautions      Mobility  Bed Mobility Overal bed mobility: Needs Assistance Bed Mobility: Supine to Sit     Supine to sit: Mod assist, +2 for safety/equipment, HOB elevated, +2 for physical assistance     General bed mobility comments: HOB elevated, cued pt to bring legs off EOB and modAx2 to ascend trunk    Transfers Overall transfer level: Needs assistance Equipment used: 2 person hand held assist Transfers: Sit to/from Stand, Bed to chair/wheelchair/BSC Sit to Stand: Min assist, +2 physical assistance   Step pivot transfers: Min assist, +2 physical assistance       General transfer comment: MinAx2 needed to power up to stand, gain balance, and step pivot to R from EOB to recliner. +3 RN to manage lines. Pt needed repeated cues to extend his hips and knees as he would progressively flex the longer he stood, needing intermittent tactile cues to successfully extend knees.  Ambulation/Gait Ambulation/Gait  assistance: Min assist, +2 safety/equipment, +2 physical assistance Gait Distance (Feet): 2 Feet Assistive device: 2 person hand held assist Gait Pattern/deviations: Step-to pattern, Decreased step length - right, Decreased step length - left, Decreased stride length, Knee flexed in stance - right, Knee flexed in stance - left, Trunk flexed Gait velocity: reduced Gait velocity interpretation: <1.31 ft/sec, indicative of household ambulator   General Gait Details: Pt takes slow, small pivotal steps to his R from bed to chair. As time in standing progressed, he would progressively flex at his hips and knees. Verbal and tactile cues provided to extend knees with success. MinAx2 for balance throughout, +3 RN to manage lines. Limited distance due to the extent of lines this date and first time OOB  Careers Information Officer     Tilt Bed    Modified Rankin (Stroke Patients Only)       Balance Overall balance assessment: Needs assistance Sitting-balance support: No upper extremity supported, Feet supported Sitting balance-Leahy Scale: Fair Sitting balance - Comments: R lateral lean, CGA-minA   Standing balance support: Bilateral upper extremity supported, During functional activity Standing balance-Leahy Scale: Poor Standing balance comment: reliant on minAx2 bil HHA                             Pertinent Vitals/Pain Pain Assessment Pain Assessment: Faces Faces Pain Scale: Hurts little more Pain Location: chest Pain Descriptors / Indicators: Discomfort Pain Intervention(s): Limited activity within patient's tolerance, Monitored during session, Repositioned    Home Living Family/patient expects to be discharged to:: Private residence Living Arrangements: Spouse/significant other Available Help at Discharge: Family (potentially can arrange 24/7, wife works second shift) Type of Home: House Home Access: Stairs to enter [employer]: Can reach  both (on x4 stairs in back) Entrance Stairs-Number of Steps: 4 (in back (easiest access to home per pt), 6 at side)   Home Layout: One level Home Equipment: Hand held shower head      Prior Function Prior Level of Function : Independent/Modified Independent;Driving;Working/employed             Mobility Comments: No AD ADLs Comments: Works as a merchandiser, 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 [employer]   Upper Extremity Assessment Upper Extremity Assessment: Right hand dominant;Defer to OT evaluation    Lower Extremity Assessment Lower Extremity Assessment: Generalized weakness (noted with functional mobility)    Cervical / Trunk Assessment Cervical / Trunk Assessment: Other exceptions (sternal precautions; LVAD/drive line)  Communication   Communication Communication: No apparent difficulties    Cognition Arousal: Alert Behavior During Therapy: WFL for tasks assessed/performed   PT - Cognitive impairments: Memory, Attention                       PT - Cognition Comments: will further assess; appeared confused with answering home questions at times; poor attention - may be related to poor sleep and medication; will further assess Following commands: Impaired Following commands impaired: Follows one step commands with increased time     Cueing Cueing Techniques: Verbal cues, Visual cues, Tactile cues     General Comments General comments (skin integrity, edema, etc.): VSS on 2L; Educated pt on checking to ensure his drive line is anchored before moving and to have his arms elevated when at rest to reduce edema.  Exercises     Assessment/Plan    PT Assessment Patient needs continued PT services  PT Problem List Decreased strength;Decreased activity tolerance;Decreased balance;Decreased mobility;Decreased cognition;Decreased knowledge of precautions;Decreased knowledge of use of DME       PT Treatment Interventions  DME instruction;Gait training;Stair training;Functional mobility training;Therapeutic activities;Therapeutic exercise;Balance training;Neuromuscular re-education;Cognitive remediation;Patient/family education    PT Goals (Current goals can be found in the Care Plan section)  Acute Rehab PT Goals Patient Stated Goal: to improve PT Goal Formulation: With patient/family Time For Goal Achievement: 10/30/24 Potential to Achieve Goals: Good    Frequency Min 3X/week     Co-evaluation   Reason for Co-Treatment: Complexity of the patient's impairments (multi-system involvement);For patient/therapist safety;To address functional/ADL transfers PT goals addressed during session: Mobility/safety with mobility;Balance OT goals addressed during session: ADL's and self-care       AM-PAC PT 6 Clicks Mobility  Outcome Measure Help needed turning from your back to your side while in a flat bed without using bedrails?: A Little Help needed moving from lying on your back to sitting on the side of a flat bed without using bedrails?: Total Help needed moving to and from a bed to a chair (including a wheelchair)?: Total Help needed standing up from a chair using your arms (e.g., wheelchair or bedside chair)?: Total Help needed to walk in hospital room?: Total Help needed climbing 3-5 steps with a railing? : Total 6 Click Score: 8    End of Session Equipment Utilized During Treatment: Oxygen  Activity Tolerance: Patient tolerated treatment well;Other (comment) (limited by lines) Patient left: in chair;with call bell/phone within reach;with family/visitor present;with chair alarm set Nurse Communication: Mobility status PT Visit Diagnosis: Unsteadiness on feet (R26.81);Other abnormalities of gait and mobility (R26.89);Muscle weakness (generalized) (M62.81);Difficulty in walking, not elsewhere classified (R26.2)    Time: 8677-8596 PT Time Calculation (min) (ACUTE ONLY): 41 min   Charges:   PT  Evaluation $PT Eval High Complexity: 1 High PT Treatments $Therapeutic Activity: 8-22 mins PT General Charges $$ ACUTE PT VISIT: 1 Visit         Theo Ferretti, PT, DPT Acute Rehabilitation Services  Office: [phone]   Theo CHRISTELLA Ferretti 10/16/2024, 5:21 PM

## 2024-10-16 NOTE — Progress Notes (Signed)
 H&V Care Navigation CSW Progress Note  Outpatient Heart Failure CSW met with pt at bedside to check in.  Pt was sitting up in chair and visiting with his son.  Reports he is doing well overall- has not gotten up to walk yet but hopeful to do that tomorrow.  Reports no needs or concerns at this time.  Will continue to follow and assist as needed  James HILARIO Leech, LCSW Clinical Social Worker Advanced Heart Failure Clinic Desk#: [phone] Cell#: [phone]

## 2024-10-16 NOTE — Progress Notes (Signed)
  Inpatient Rehab Admissions Coordinator :  Per therapy recommendations, patient was screened for CIR candidacy by Ottie Glazier RN MSN.  At this time patient appears to be a potential candidate for CIR. I will place a rehab consult per protocol for full assessment. Please call me with any questions.  Ottie Glazier RN MSN Admissions Coordinator [phone]

## 2024-10-17 ENCOUNTER — Inpatient Hospital Stay (HOSPITAL_COMMUNITY)

## 2024-10-17 DIAGNOSIS — J9601 Acute respiratory failure with hypoxia: Secondary | ICD-10-CM | POA: Diagnosis not present

## 2024-10-17 DIAGNOSIS — I5023 Acute on chronic systolic (congestive) heart failure: Secondary | ICD-10-CM | POA: Diagnosis not present

## 2024-10-17 DIAGNOSIS — E1165 Type 2 diabetes mellitus with hyperglycemia: Secondary | ICD-10-CM | POA: Diagnosis not present

## 2024-10-17 DIAGNOSIS — Z95811 Presence of heart assist device: Secondary | ICD-10-CM | POA: Diagnosis not present

## 2024-10-17 DIAGNOSIS — I480 Paroxysmal atrial fibrillation: Secondary | ICD-10-CM | POA: Diagnosis not present

## 2024-10-17 DIAGNOSIS — N1831 Chronic kidney disease, stage 3a: Secondary | ICD-10-CM | POA: Diagnosis not present

## 2024-10-17 DIAGNOSIS — E871 Hypo-osmolality and hyponatremia: Secondary | ICD-10-CM | POA: Diagnosis not present

## 2024-10-17 DIAGNOSIS — E1122 Type 2 diabetes mellitus with diabetic chronic kidney disease: Secondary | ICD-10-CM | POA: Diagnosis not present

## 2024-10-17 DIAGNOSIS — I272 Pulmonary hypertension, unspecified: Secondary | ICD-10-CM | POA: Diagnosis not present

## 2024-10-17 DIAGNOSIS — D649 Anemia, unspecified: Secondary | ICD-10-CM | POA: Diagnosis not present

## 2024-10-17 DIAGNOSIS — D696 Thrombocytopenia, unspecified: Secondary | ICD-10-CM | POA: Diagnosis not present

## 2024-10-17 DIAGNOSIS — D72829 Elevated white blood cell count, unspecified: Secondary | ICD-10-CM | POA: Diagnosis not present

## 2024-10-17 DIAGNOSIS — R570 Cardiogenic shock: Secondary | ICD-10-CM | POA: Diagnosis not present

## 2024-10-17 DIAGNOSIS — I472 Ventricular tachycardia, unspecified: Secondary | ICD-10-CM | POA: Diagnosis not present

## 2024-10-17 LAB — POCT I-STAT 7, (LYTES, BLD GAS, ICA,H+H)
Acid-base deficit: 1 mmol/L (ref 0.0–2.0)
Bicarbonate: 23.6 mmol/L (ref 20.0–28.0)
Calcium, Ion: 1.15 mmol/L (ref 1.15–1.40)
HCT: 23 % — ABNORMAL LOW (ref 39.0–52.0)
Hemoglobin: 7.8 g/dL — ABNORMAL LOW (ref 13.0–17.0)
O2 Saturation: 99 %
Patient temperature: 98
Potassium: 3.3 mmol/L — ABNORMAL LOW (ref 3.5–5.1)
Sodium: 129 mmol/L — ABNORMAL LOW (ref 135–145)
TCO2: 25 mmol/L (ref 22–32)
pCO2 arterial: 35.4 mmHg (ref 32–48)
pH, Arterial: 7.432 (ref 7.35–7.45)
pO2, Arterial: 116 mmHg — ABNORMAL HIGH (ref 83–108)

## 2024-10-17 LAB — BASIC METABOLIC PANEL WITH GFR
Anion gap: 11 (ref 5–15)
Anion gap: 12 (ref 5–15)
BUN: 35 mg/dL — ABNORMAL HIGH (ref 8–23)
BUN: 38 mg/dL — ABNORMAL HIGH (ref 8–23)
CO2: 22 mmol/L (ref 22–32)
CO2: 24 mmol/L (ref 22–32)
Calcium: 8.5 mg/dL — ABNORMAL LOW (ref 8.9–10.3)
Calcium: 8.6 mg/dL — ABNORMAL LOW (ref 8.9–10.3)
Chloride: 90 mmol/L — ABNORMAL LOW (ref 98–111)
Chloride: 91 mmol/L — ABNORMAL LOW (ref 98–111)
Creatinine, Ser: 1.72 mg/dL — ABNORMAL HIGH (ref 0.61–1.24)
Creatinine, Ser: 1.9 mg/dL — ABNORMAL HIGH (ref 0.61–1.24)
GFR, Estimated: 39 mL/min — ABNORMAL LOW
GFR, Estimated: 44 mL/min — ABNORMAL LOW
Glucose, Bld: 151 mg/dL — ABNORMAL HIGH (ref 70–99)
Glucose, Bld: 153 mg/dL — ABNORMAL HIGH (ref 70–99)
Potassium: 3.6 mmol/L (ref 3.5–5.1)
Potassium: 4 mmol/L (ref 3.5–5.1)
Sodium: 125 mmol/L — ABNORMAL LOW (ref 135–145)
Sodium: 125 mmol/L — ABNORMAL LOW (ref 135–145)

## 2024-10-17 LAB — COMPREHENSIVE METABOLIC PANEL WITH GFR
ALT: <5 U/L (ref 0–44)
AST: 56 U/L — ABNORMAL HIGH (ref 15–41)
Albumin: 3.3 g/dL — ABNORMAL LOW (ref 3.5–5.0)
Alkaline Phosphatase: 90 U/L (ref 38–126)
Anion gap: 12 (ref 5–15)
BUN: 35 mg/dL — ABNORMAL HIGH (ref 8–23)
CO2: 23 mmol/L (ref 22–32)
Calcium: 8.7 mg/dL — ABNORMAL LOW (ref 8.9–10.3)
Chloride: 92 mmol/L — ABNORMAL LOW (ref 98–111)
Creatinine, Ser: 1.81 mg/dL — ABNORMAL HIGH (ref 0.61–1.24)
GFR, Estimated: 41 mL/min — ABNORMAL LOW
Glucose, Bld: 129 mg/dL — ABNORMAL HIGH (ref 70–99)
Potassium: 3.7 mmol/L (ref 3.5–5.1)
Sodium: 127 mmol/L — ABNORMAL LOW (ref 135–145)
Total Bilirubin: 0.6 mg/dL (ref 0.0–1.2)
Total Protein: 5.7 g/dL — ABNORMAL LOW (ref 6.5–8.1)

## 2024-10-17 LAB — LACTATE DEHYDROGENASE: LDH: 615 U/L — ABNORMAL HIGH (ref 105–235)

## 2024-10-17 LAB — CBC WITH DIFFERENTIAL/PLATELET
Abs Immature Granulocytes: 0.24 K/uL — ABNORMAL HIGH (ref 0.00–0.07)
Basophils Absolute: 0 K/uL (ref 0.0–0.1)
Basophils Relative: 0 %
Eosinophils Absolute: 0 K/uL (ref 0.0–0.5)
Eosinophils Relative: 0 %
HCT: 23.6 % — ABNORMAL LOW (ref 39.0–52.0)
Hemoglobin: 8 g/dL — ABNORMAL LOW (ref 13.0–17.0)
Immature Granulocytes: 1 %
Lymphocytes Relative: 2 %
Lymphs Abs: 0.4 K/uL — ABNORMAL LOW (ref 0.7–4.0)
MCH: 31.1 pg (ref 26.0–34.0)
MCHC: 33.9 g/dL (ref 30.0–36.0)
MCV: 91.8 fL (ref 80.0–100.0)
Monocytes Absolute: 1.4 K/uL — ABNORMAL HIGH (ref 0.1–1.0)
Monocytes Relative: 8 %
Neutro Abs: 15.9 K/uL — ABNORMAL HIGH (ref 1.7–7.7)
Neutrophils Relative %: 89 %
Platelets: 143 K/uL — ABNORMAL LOW (ref 150–400)
RBC: 2.57 MIL/uL — ABNORMAL LOW (ref 4.22–5.81)
RDW: 17.4 % — ABNORMAL HIGH (ref 11.5–15.5)
WBC: 18 K/uL — ABNORMAL HIGH (ref 4.0–10.5)
nRBC: 0.2 % (ref 0.0–0.2)

## 2024-10-17 LAB — GLUCOSE, CAPILLARY
Glucose-Capillary: 138 mg/dL — ABNORMAL HIGH (ref 70–99)
Glucose-Capillary: 140 mg/dL — ABNORMAL HIGH (ref 70–99)
Glucose-Capillary: 148 mg/dL — ABNORMAL HIGH (ref 70–99)
Glucose-Capillary: 149 mg/dL — ABNORMAL HIGH (ref 70–99)
Glucose-Capillary: 155 mg/dL — ABNORMAL HIGH (ref 70–99)
Glucose-Capillary: 209 mg/dL — ABNORMAL HIGH (ref 70–99)
Glucose-Capillary: 215 mg/dL — ABNORMAL HIGH (ref 70–99)

## 2024-10-17 LAB — PROTIME-INR
INR: 1.4 — ABNORMAL HIGH (ref 0.8–1.2)
Prothrombin Time: 17.4 s — ABNORMAL HIGH (ref 11.4–15.2)

## 2024-10-17 LAB — COOXEMETRY PANEL
Carboxyhemoglobin: 1.7 % — ABNORMAL HIGH (ref 0.5–1.5)
Methemoglobin: 0.7 % (ref 0.0–1.5)
O2 Saturation: 56.5 %
Total hemoglobin: 8.4 g/dL — ABNORMAL LOW (ref 12.0–16.0)

## 2024-10-17 LAB — PHOSPHORUS: Phosphorus: 3.3 mg/dL (ref 2.5–4.6)

## 2024-10-17 LAB — MAGNESIUM
Magnesium: 1.8 mg/dL (ref 1.7–2.4)
Magnesium: 1.9 mg/dL (ref 1.7–2.4)

## 2024-10-17 MED ORDER — INSULIN GLARGINE 100 UNIT/ML ~~LOC~~ SOLN
20.0000 [IU] | Freq: Every day | SUBCUTANEOUS | Status: DC
  Administered 2024-10-17 – 2024-10-18 (×2): 20 [IU] via SUBCUTANEOUS
  Filled 2024-10-17 (×2): qty 0.2

## 2024-10-17 MED ORDER — SPIRONOLACTONE 25 MG PO TABS
25.0000 mg | ORAL_TABLET | Freq: Every day | ORAL | Status: DC
  Administered 2024-10-17 – 2024-10-22 (×6): 25 mg via ORAL
  Filled 2024-10-17 (×6): qty 1

## 2024-10-17 MED ORDER — MAGNESIUM SULFATE 2 GM/50ML IV SOLN
2.0000 g | Freq: Once | INTRAVENOUS | Status: AC
  Administered 2024-10-17: 2 g via INTRAVENOUS
  Filled 2024-10-17: qty 50

## 2024-10-17 MED ORDER — SORBITOL 70 % SOLN
30.0000 mL | Freq: Once | Status: AC
  Administered 2024-10-17: 30 mL via ORAL
  Filled 2024-10-17: qty 30

## 2024-10-17 MED ORDER — GUAIFENESIN-DM 100-10 MG/5ML PO SYRP
5.0000 mL | ORAL_SOLUTION | ORAL | Status: DC | PRN
  Administered 2024-10-17 – 2024-10-24 (×12): 5 mL via ORAL
  Filled 2024-10-17 (×12): qty 5

## 2024-10-17 MED ORDER — WARFARIN SODIUM 2 MG PO TABS
2.0000 mg | ORAL_TABLET | Freq: Once | ORAL | Status: AC
  Administered 2024-10-17: 2 mg via ORAL
  Filled 2024-10-17: qty 1

## 2024-10-17 MED ORDER — SILDENAFIL CITRATE 20 MG PO TABS
20.0000 mg | ORAL_TABLET | Freq: Three times a day (TID) | ORAL | Status: DC
  Administered 2024-10-17 – 2024-11-02 (×48): 20 mg via ORAL
  Filled 2024-10-17 (×48): qty 1

## 2024-10-17 MED ORDER — AMIODARONE LOAD VIA INFUSION
150.0000 mg | Freq: Once | INTRAVENOUS | Status: AC
  Administered 2024-10-17: 150 mg via INTRAVENOUS
  Filled 2024-10-17: qty 83.34

## 2024-10-17 NOTE — Progress Notes (Signed)
" ° °  [address], [place] [address]             [phone]   Getting back into bed, ambulated a short distance in hallway  BP 92/79 (BP Location: Left Arm)   Pulse 67   Temp 98.6 F (37 C)   Resp 16   Ht 6' 6 (1.981 m)   Wt 110.8 kg   SpO2 (!) 71%   BMI 28.23 kg/m    Intake/Output Summary (Last 24 hours) at 10/17/2024 1759 Last data filed at 10/17/2024 1711 Gross per 24 hour  Intake 1799.59 ml  Output 2615 ml  Net -815.41 ml   Continue current Rx  James Overturf C. Kerrin, MD [facility] [phone]   "

## 2024-10-17 NOTE — Progress Notes (Signed)
 "    Advanced Heart Failure Rounding Note   [facility] Cardiologist: Dr. Zenaida  Patient Profile   James [name] is a 64 y.o. male with history of stage D cardiomyopathy/chronic systolic heart failure now end-stage w/ inotrope dependence, CKD IIIa, hx VT, PAF, prostate cancer, meningioma.   Admitted for LVAD implant/ pre-VAD HF optimization.   Significant events:   12/18: Milrinone  titrated from 0.25 to 0.5 mcg/kg/min post RHC d/t low-output and pulmonary hypertension 12/19: NE added 12/22: Markedly elevated PA pressures, elevated PCWP and elevated SVR. Titrated off NE. Added DBA.  12/23: Impella 5.5 Implanted.  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 Heparin  > Bival.  12/31: HM III LVAD implant  Subjective:    POD # 3  Starting to have better urine output. Discussed with CT surgery this AM, pocket drain and L pleural tube to remain in place given volume overload.   Continue epi at 3 and 0.25 of milrinone . Would not wean today under CVP improved.  PAP: (52-73)/(8-29) 65/17 CVP:  [3 mmHg-15 mmHg] 15 mmHg CO:  [4.5 L/min-6.8 L/min] 6.8 L/min CI:  [1.9 L/min/m2-2.91 L/min/m2] 2.91 L/min/m2  Speed increased yesterday to 5300RPM, maintain given elevated CVP, high normal PCWP.   HeartMate 3 VAD Equipment Check Pump Speed (RPM): 5300 RPM Pump Flow (LPM): 4.9 Power (Watts): 4 Watts Pulsatility Index: 3.4 Fixed Speed Limit: 5300 rpm Low Speed Limit: 5000 rpm Alarms: No alarms Auscultated: Normal expected humming Power Module Self-Test (Daily): Done System Controller Self-Test: Passed Patient Battery Source: [facility] / Wall unit Emergency Equipment at Bedside: Yes  Objective:     Weight Range: 110.8 kg Body mass index is 28.23 kg/m.   Vital Signs:   Temp:  [98.8 F (37.1 C)-99.7 F (37.6 C)] 99 F (37.2 C) (01/03 0800) Pulse Rate:  [62-209] 72 (01/03 0800) Resp:  [10-26] 13 (01/03 0800) BP: (83-98)/(68-88) 98/88 (01/03 0800) SpO2:  [66 %-100 %] 100 % (01/03 0800) Arterial Line BP:  (80-274)/(53-232) 105/77 (01/03 0800) Weight:  [110.8 kg] 110.8 kg (01/03 0500) Last BM Date : 10/13/24  Weight change: Filed Weights   10/15/24 0500 10/16/24 0500 10/17/24 0500  Weight: 107.7 kg 106.8 kg 110.8 kg   Intake/Output:  Intake/Output Summary (Last 24 hours) at 10/17/2024 1056 Last data filed at 10/17/2024 0800 Gross per 24 hour  Intake 1477.4 ml  Output 3140 ml  Net -1662.6 ml    Physical Exam   Physical Exam: General:  Well appearing. No resp difficulty Cor: Mechanical heart sounds with LVAD hum present. JVP moderately elevated, 1+ whole body edema Lungs: Normal WOB Abdomen: soft, nontender, nondistended.  Driveline: C/D/I; securement device intact and driveline incorporated Neuro: alert & orientedx3, cranial nerves grossly intact. moves all 4 extremities w/o difficulty.      Telemetry   V paced 80, PVCs  Labs   CBC Recent Labs    10/16/24 0414 10/16/24 0451 10/17/24 0438 10/17/24 0442  WBC 21.5*  --   --  18.0*  NEUTROABS 19.3*  --   --  15.9*  HGB 8.6*   < > 7.8* 8.0*  HCT 25.4*   < > 23.0* 23.6*  MCV 92.4  --   --  91.8  PLT 124*  --   --  143*   < > = values in this interval not displayed.   Basic Metabolic Panel Recent Labs    98/97/73 0414 10/16/24 0451 10/17/24 0002 10/17/24 0438 10/17/24 0442  NA 128*   < > 125* 129* 127*  K 4.0   < >  4.0 3.3* 3.7  CL 94*   < > 91*  --  92*  CO2 22   < > 22  --  23  GLUCOSE 154*   < > 151*  --  129*  BUN 24*   < > 35*  --  35*  CREATININE 1.62*   < > 1.90*  --  1.81*  CALCIUM  9.0   < > 8.5*  --  8.7*  MG 2.0  --   --   --  1.8  PHOS 4.4  --   --   --  3.3   < > = values in this interval not displayed.   Liver Function Tests Recent Labs    10/16/24 0414 10/17/24 0442  AST 102* 56*  ALT 15 <5  ALKPHOS 87 90  BILITOT 1.2 0.6  PROT 5.9* 5.7*  ALBUMIN  3.6 3.3*    BNP (last 3 results) Recent Labs    06/10/24 1213 07/09/24 1649 07/23/24 0959  BNP 1,071.5* 3,316.0* 595.8*   ProBNP  (last 3 results) Recent Labs    10/01/24 1030 10/15/24 0400  PROBNP 8,374.0* 9,508.0*   Medications:    Scheduled Medications:  acetaminophen   1,000 mg Oral Q6H   Or   acetaminophen  (TYLENOL ) oral liquid 160 mg/5 mL  1,000 mg Per Tube Q6H   bisacodyl   10 mg Oral Daily   Or   bisacodyl   10 mg Rectal Daily   Chlorhexidine  Gluconate Cloth  6 each Topical Daily   docusate sodium   100 mg Oral Daily   gabapentin   200 mg Oral QHS   insulin  aspart  0-24 Units Subcutaneous Q4H   insulin  glargine  20 Units Subcutaneous Daily   melatonin  3 mg Oral QHS   metoCLOPramide  (REGLAN ) injection  10 mg Intravenous Q6H   mexiletine  200 mg Oral Q8H   pantoprazole   40 mg Oral Daily   polyethylene glycol  17 g Oral Daily   potassium chloride   40 mEq Oral BID   senna  2 tablet Oral Daily   sodium chloride  flush  10-40 mL Intracatheter Q12H   sodium chloride  flush  3 mL Intravenous Q12H   spironolactone   25 mg Oral Daily   warfarin  2 mg Oral ONCE-1600   Warfarin - Pharmacist Dosing Inpatient   Does not apply q1600    Infusions:  amiodarone  30 mg/hr (10/17/24 0800)   epinephrine  3 mcg/min (10/17/24 0800)   furosemide  (LASIX ) 200 mg in dextrose  5 % 100 mL (2 mg/mL) infusion 20 mg/hr (10/17/24 0800)   magnesium  sulfate bolus IVPB 2 g (10/17/24 1036)   milrinone  0.25 mcg/kg/min (10/17/24 1035)    PRN Medications: dextrose , fentaNYL  (SUBLIMAZE ) injection, ondansetron  (ZOFRAN ) IV, mouth rinse, oxyCODONE , phenol, sodium chloride  flush, sodium chloride  flush, traMADol , traZODone   Assessment/Plan   Acute on chronic systolic CHF, NYHA IV: Nonischemic cardiomyopathy, planned LVAD. RHC 12/18 showed elevated filling pressures with mod-severe mixed pulmonary arterial/pulmonary venous hypertension and low CO despite milrinone  0.25, increased to 0.5. Impella 5.5 placed 12/23 for further optimization. - S/p HM III LVAD implant by Dr. Lucas 10/14/24 - iNO off 1/1 - Currently on 0.25 milrinone  + 3 Epi -  Start sildenafil  20mg  TID for RV support - TD CI improving - CVP 15, urine output increasing on lasix  gtt at 20, reassess later today if further augmentation needed - Start spironolactone  25mg  daily  Pulmonary hypertension: Severe combined pre/post capillary PH on RHC, suspect largely due to venous remodeling. Improved with adequate unloading. - Good  pulsatility, does not need further inotropic support - Continue lasix  gtt as above  AKI on CKD stage 3: Scr stable improving today to 1.6 - Diuresis as above  PVCs/NSVT: Improved. Has Biotronik ICD. - Continue IV amio. - Restart mexiletine 200 TID  - Tachy therapies off for VAD implant  Paroxysmal Atrial fibrillation: Remains in NSR.  - Continue amiodarone  as above.  - Warfarin per TCTS  Meningioma: Monitoring.   Prostate cancer: Treated with radiation.   Thrombocytopenia: suspect consumptive with impella.  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  Critical care was necessary to treat or prevent imminent or life-threatening deterioration.  Critical care was time spent personally by me on the following activities: development of treatment plan with patient and/or surrogate as well as nursing, discussions with consultants, evaluation of patient's response to treatment, examination of patient, obtaining history from patient or surrogate, ordering and performing treatments and interventions, ordering and review of laboratory studies, ordering and review of radiographic studies, pulse oximetry and re-evaluation of patient's condition.   Please visit [email or web address]: For overnight coverage please call cardiology fellow  first. If fellow not available call Shock/ECMO MD on call.  For ECMO / Mechanical Support (Impella, IABP, LVAD) issues call Shock / ECMO MD on call.  "

## 2024-10-17 NOTE — Progress Notes (Signed)
 Physical Therapy Treatment Patient Details Name: James [name] MRN: [id number] DOB: 02/07/1961 Today's Date: 10/17/2024   History of Present Illness Pt is a 64 yo male who was admitted 10/01/24 for pre-VAD optimization prior to planned surgery. S/p R heart cath 12/18 & 12/24, impella placement 12/23, impella removal and HM III LVAD placement 12/31. Extubated 10/15/24. PMH: CKD IIIa, hx VT, PAF, prostate cancer, meningioma, COPD, DM2, HTN, hypercholesteremia, OSA, pulmonary hypertension, tobacco abuse.    PT Comments  Progressing towards acute functional goals, requires up to mod assist +2 for bed mobility and transfer from multiple surfaces today. Using heart pillow for comfort and to reinforce precautions, demonstrates significant LE weakness. Elyn walker utilized to steady and support, demonstrates heavy rightward lean at times and LEs slowly buckle as he fatigues. Navigated up to 45 feet with mod assist +2. Reviewed sternal precautions, LVAD kit requirements, battery vest set-up, and demonstrated battery change to patient. Family present (wife [name]).Patient will continue to benefit from skilled physical therapy services to further improve independence with functional mobility.    If plan is discharge home, recommend the following: Two people to help with walking and/or transfers;Two people to help with bathing/dressing/bathroom;Assistance with cooking/housework;Direct supervision/assist for medications management;Direct supervision/assist for financial management;Assist for transportation;Help with stairs or ramp for entrance;Supervision due to cognitive status   Can travel by private vehicle        Equipment Recommendations  Rolling walker (2 wheels);BSC/3in1;Wheelchair (measurements PT);Wheelchair cushion (measurements PT) (pending progress; may progress to needing only a rollator and 3in1)    Recommendations for Other Services Rehab consult     Precautions / Restrictions  Precautions Precautions: Fall;Sternal;Other (comment) Precaution Booklet Issued: No Recall of Precautions/Restrictions: Impaired Precaution/Restrictions Comments: LVAD; Y-chest tube; A-line; [name] Purl Restrictions Weight Bearing Restrictions Per Provider Order: Yes RUE Weight Bearing Per Provider Order: Non weight bearing LUE Weight Bearing Per Provider Order: Non weight bearing Other Position/Activity Restrictions: sternal precautions     Mobility  Bed Mobility Overal bed mobility: Needs Assistance Bed Mobility: Rolling, Sidelying to Sit Rolling: Min assist Sidelying to sit: Mod assist, +2 for physical assistance, +2 for safety/equipment, HOB elevated       General bed mobility comments: Modified roll and rise with up to mod assist +2 for trunk support, able to move LEs off EOB. holding heart pillow.    Transfers Overall transfer level: Needs assistance Equipment used: Ambulation equipment used Transfers: Sit to/from Stand, Bed to chair/wheelchair/BSC Sit to Stand: +2 physical assistance, Mod assist, +2 safety/equipment           General transfer comment: Mod assist +2 for boost to stand, practiced from bed and recliner. Needs cues for set-up (scoot to edge, pull feet back, lean forward.) Poor carry over and needs several cues to complete. Slow rise, LEs weak. Assisted to sit as well, max cues to bring hands into midline prior to sitting. Elyn Finder used to stabilize upon standing.    Ambulation/Gait Ambulation/Gait assistance: +2 safety/equipment, +2 physical assistance, Mod assist Gait Distance (Feet): 45 Feet Assistive device: Elyn Finder Gait Pattern/deviations: Decreased stride length, Knee flexed in stance - right, Knee flexed in stance - left, Trunk flexed, Step-through pattern, Knees buckling, Drifts right/left, Narrow base of support Gait velocity: reduced Gait velocity interpretation: <1.31 ft/sec, indicative of household ambulator   General Gait Details: Mod  assist +2 for balance, eva walker control, and equipment management. Denies dizziness. needs intermittent cues for correcting posture and reducing pressure through UEs on Eva. Pts LEs begin  to flex as he fatigues, needing knee block backing up to sit in recliner due to light buckling. Moderate lean towards Rt requiring therapist to assist heavily with eva control and balance.   Stairs             Wheelchair Mobility     Tilt Bed    Modified Rankin (Stroke Patients Only)       Balance Overall balance assessment: Needs assistance Sitting-balance support: No upper extremity supported, Feet supported Sitting balance-Leahy Scale: Poor Sitting balance - Comments: R lateral lean, CGA-minA Postural control: Right lateral lean, Posterior lean Standing balance support: Bilateral upper extremity supported, During functional activity Standing balance-Leahy Scale: Poor Standing balance comment: reliant on min -mod A                            Communication Communication Communication: No apparent difficulties  Cognition Arousal: Alert Behavior During Therapy: WFL for tasks assessed/performed   PT - Cognitive impairments: Memory, Attention, Problem solving, Sequencing                         Following commands: Impaired Following commands impaired: Follows one step commands with increased time    Cueing Cueing Techniques: Verbal cues, Tactile cues, Gestural cues  Exercises      General Comments General comments (skin integrity, edema, etc.): Reviewed LVAD Kit items: Recalls 1/3. Verbally reviewed battery change and vest use (assisted pt with set-up today.) LVAD: 5300 RPM, 5.0 LPM, 3.7 W. BP 92/79, HR 70. Unable to get consistent pleth, but only dyspneic towards end of ambulatory distance, no dyspnea at rest.      Pertinent Vitals/Pain Pain Assessment Pain Assessment: Faces Faces Pain Scale: Hurts little more Pain Location: chest Pain Descriptors /  Indicators: Discomfort Pain Intervention(s): Limited activity within patient's tolerance, Monitored during session, Repositioned    Home Living                          Prior Function            PT Goals (current goals can now be found in the care plan section) Acute Rehab PT Goals Patient Stated Goal: to improve PT Goal Formulation: With patient/family Time For Goal Achievement: 10/30/24 Potential to Achieve Goals: Good Progress towards PT goals: Progressing toward goals    Frequency    Min 3X/week      PT Plan      Co-evaluation              AM-PAC PT 6 Clicks Mobility   Outcome Measure  Help needed turning from your back to your side while in a flat bed without using bedrails?: A Little Help needed moving from lying on your back to sitting on the side of a flat bed without using bedrails?: A Lot Help needed moving to and from a bed to a chair (including a wheelchair)?: Total Help needed standing up from a chair using your arms (e.g., wheelchair or bedside chair)?: Total Help needed to walk in hospital room?: Total Help needed climbing 3-5 steps with a railing? : Total 6 Click Score: 9    End of Session Equipment Utilized During Treatment: Oxygen  Activity Tolerance: Patient tolerated treatment well Patient left: in chair;with family/visitor present;with call bell/phone within reach;with nursing/sitter in room Nurse Communication: Mobility status PT Visit Diagnosis: Unsteadiness on feet (R26.81);Other abnormalities of gait and mobility (R26.89);Muscle  weakness (generalized) (M62.81);Difficulty in walking, not elsewhere classified (R26.2);Pain Pain - part of body:  (Sternum)     Time: 8667-8584 PT Time Calculation (min) (ACUTE ONLY): 43 min  Charges:    $Gait Training: 8-22 mins $Therapeutic Activity: 23-37 mins PT General Charges $$ ACUTE PT VISIT: 1 Visit                     Leontine Roads, PT, DPT [facility]  Rehabilitation  Services Physical Therapist Office: [phone] Website: [email or web address]    Leontine GORMAN Roads 10/17/2024, 3:12 PM

## 2024-10-17 NOTE — Progress Notes (Signed)
 "  NAME:  James [name], MRN:  [id number], DOB:  09-04-1961, LOS: 16 ADMISSION DATE:  10/01/2024, CONSULTATION DATE:  10/14/24 REFERRING MD:  Lucas , CHIEF COMPLAINT:  s/p LVAD   History of Present Illness:  64 yo M with stg D cardiomyopathy + inotrope dependence, CKD 3a, hx VT, pAFib, meningioma who was admitted to HF service 10/01/24 for pre-VAD optimization  This included: RHC, milrinone  titration, impella placement, levo and DBA titration and volume optimization  On 10/14/24 he went for LVAD  Pump: 116 min  EBL: 750 Product: 2 plt 4 ffp 708 cellsaver  PCCM is consulted post op ICU management.  Pertinent  Medical History  HFrEF Inotrope dependence VT pAFib  Prostate cancer Meningioma  HTN DM2   Significant Hospital Events: Including procedures, antibiotic start and stop dates in addition to other pertinent events    10/01/24 RHC w low output and pHTN, and subsequent uptitration of milrinone  from 0.25 to 0.5 10/02/24 started ND 10/05/24 high PA pressures, hignPCWP high SVR. 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 Hep to bival 10/13/24 not likely HIT. Milrinone  to 0.375  10/14/24 LVAD. PCCM consult.  10/26/23 extubated, nitric off, 1u prbc, lasix   Interim History / Subjective:  Patient does not have any complaints for me this morning.  He was sitting and eating breakfast. He continues to be on amiodarone , epinephrine , Lasix  drip along with milrinone .  Objective    Blood pressure 92/79, pulse 67, temperature 98.6 F (37 C), resp. rate 16, height 6' 6 (1.981 m), weight 110.8 kg, SpO2 (!) 71%. PAP: (42-73)/(10-25) 56/13 CVP:  [3 mmHg-21 mmHg] 7 mmHg CO:  [4.5 L/min-6.8 L/min] 6.8 L/min CI:  [1.9 L/min/m2-2.91 L/min/m2] 2.91 L/min/m2      Intake/Output Summary (Last 24 hours) at 10/17/2024 1729 Last data filed at 10/17/2024 1711 Gross per  24 hour  Intake 1799.59 ml  Output 2615 ml  Net -815.41 ml   Filed Weights   10/15/24 0500 10/16/24 0500 10/17/24 0500  Weight: 107.7 kg 106.8 kg 110.8 kg    Examination:  Physical exam: General: Alert and oriented.  Follows commands. HEENT: [place]/AT, eyes anicteric.  moist mucus membranes Neuro: Alert, awake following commands Chest: Coarse breath sounds, no wheezes or rhonchi Heart: Regular rate and rhythm, no murmurs or gallops.  LVAD hum is noted. Abdomen: Soft, nontender, nondistended, bowel sounds present   LVAD  Speed 5250, flow 4.8L, power 3.5, PI 3.4  Milrinone  0.25 Epi 3 Amio 30   CVP PA CI  coox 56%  Resolved problem list   Assessment and Plan   Acute on chronic HFrEF, stage D cardiomyopathy s/p LVAD (HM3 10/14/24)  pHTN, acute on chronic RV failure   PVCs, NSVT  pAFib  -post-op care per TCTS - Pleural drain in place.  Mediastinal chest tubes removed yesterday.  And blake drain from impella site  - coumadin  this evening per pharmacy  - Continue milrinone , epinephrine  and Amio drips as above. -Lasix  drip is continued at 20 mg/h. - con't mexiletine 200mg  q8h  - complete perioperative ancef   - mobility as able   Acute hypoxic respiratory failure 2/2 pulmonary edema, deconditioning  - O2 wean for sat >92%  - mobility -> oob if tolerates  - incentive spirometry   LUE swelling  - obtain venous duplex DVT study   Leukocytosis  May be all post-operative, critical illness reactivity; no true fevers  -  trend fever, wbc curves  - still on perioperative antibiotics   CKD 3a Hyponatremia  Nephro labs stable.  Hyponatremia ongoing 130>128, may be now oral water  intake contributing in addition to his HF pathophys  - monitor  - drink solutes in addition to water  I.e. gatorade etc.  - renal perfusion, renally dose meds, elyte replacement, strict I/O  DM2 w hyperglycemia  - ssi resistant scale  - add lantus  10 units daily  - cbg q4h   Anemia; expected  operative blood loss.  Thrombocytopenia; not HIT - trend  - transfuse prn   Labs   CBC: Recent Labs  Lab 10/14/24 2000 10/14/24 2005 10/15/24 0400 10/15/24 0557 10/15/24 1613 10/15/24 1614 10/16/24 0414 10/16/24 0451 10/17/24 0438 10/17/24 0442  WBC 19.0*  --  13.1*  --  15.7*  --  21.5*  --   --  18.0*  NEUTROABS  --   --  11.6*  --   --   --  19.3*  --   --  15.9*  HGB 8.1*   < > 7.8*   < > 8.6* 9.2* 8.6* 9.2* 7.8* 8.0*  HCT 23.8*   < > 22.7*   < > 24.9* 27.0* 25.4* 27.0* 23.0* 23.6*  MCV 93.3  --  92.3  --  90.5  --  92.4  --   --  91.8  PLT 87*  --  94*  --  95*  --  124*  --   --  143*   < > = values in this interval not displayed.    Basic Metabolic Panel: Recent Labs  Lab 10/13/24 0514 10/13/24 0515 10/14/24 2000 10/14/24 2005 10/15/24 0400 10/15/24 0557 10/15/24 1613 10/15/24 1614 10/16/24 0414 10/16/24 0451 10/16/24 1411 10/16/24 1845 10/17/24 0002 10/17/24 0438 10/17/24 0442  NA  --    < > 131*   < > 133*   < > 130*   < > 128*   < > 126* 126* 125* 129* 127*  K  --    < > 4.0   < > 4.0   < > 3.7   < > 4.0   < > 4.4 3.7 4.0 3.3* 3.7  CL  --    < > 97*  --  99  --  95*  --  94*  --  92* 92* 91*  --  92*  CO2  --    < > 22  --  22  --  21*  --  22  --  19* 19* 22  --  23  GLUCOSE  --    < > 167*  --  119*  --  129*  --  154*  --  177* 234* 151*  --  129*  BUN  --    < > 24*  --  24*  --  22  --  24*  --  30* 35* 35*  --  35*  CREATININE  --    < > 1.59*  --  1.55*  --  1.59*  --  1.62*  --  2.02* 2.11* 1.90*  --  1.81*  CALCIUM   --    < > 9.1  --  9.2  --  8.8*  --  9.0  --  8.7* 8.8* 8.5*  --  8.7*  MG  --    < > 2.5*  --  2.3  --  1.9  --  2.0  --   --   --   --   --  1.8  PHOS 1.9*  --   --   --  3.3  --   --   --  4.4  --   --   --   --   --  3.3   < > = values in this interval not displayed.   GFR: Estimated Creatinine Clearance: 58.6 mL/min (A) (by C-G formula based on SCr of 1.81 mg/dL (H)). Recent Labs  Lab 10/15/24 0400 10/15/24 1613  10/16/24 0414 10/16/24 1706 10/17/24 0442  WBC 13.1* 15.7* 21.5*  --  18.0*  LATICACIDVEN  --   --   --  1.6  --     Liver Function Tests: Recent Labs  Lab 10/15/24 0400 10/16/24 0414 10/17/24 0442  AST 82* 102* 56*  ALT 16 15 <5  ALKPHOS 90 87 90  BILITOT 1.3* 1.2 0.6  PROT 5.3* 5.9* 5.7*  ALBUMIN  3.4* 3.6 3.3*     ABG    Component Value Date/Time   PHART 7.432 10/17/2024 0438   PCO2ART 35.4 10/17/2024 0438   PO2ART 116 (H) 10/17/2024 0438   HCO3 23.6 10/17/2024 0438   TCO2 25 10/17/2024 0438   ACIDBASEDEF 1.0 10/17/2024 0438   O2SAT 56.5 10/17/2024 0542     Critical care time: 35 Minutes.     Tamela Stakes, MD  Attending Physician, Critical Care Medicine [facility] See Amion for pager If no response to pager, please call [phone] until 7pm After 7pm, Please call E-link [phone]          "

## 2024-10-17 NOTE — Progress Notes (Signed)
 3 Days Post-Op Procedures (LRB): INSERTION OF IMPLANTABLE LEFT VENTRICULAR ASSIST DEVICE HEARTMATE 3; REMOVAL OF IMPELLA 5.5 (N/A) ECHOCARDIOGRAM, TRANSESOPHAGEAL, INTRAOPERATIVE (N/A) Subjective: No complaints this AM, denies pain  Objective: Vital signs in last 24 hours: Temp:  [98.8 F (37.1 C)-99.7 F (37.6 C)] 99 F (37.2 C) (01/03 0800) Pulse Rate:  [62-209] 72 (01/03 0800) Cardiac Rhythm: Normal sinus rhythm (01/03 0800) Resp:  [10-26] 13 (01/03 0800) BP: (83-98)/(68-88) 98/88 (01/03 0800) SpO2:  [66 %-100 %] 100 % (01/03 0800) Arterial Line BP: (80-274)/(53-232) 105/77 (01/03 0800) Weight:  [110.8 kg] 110.8 kg (01/03 0500)  Hemodynamic parameters for last 24 hours: PAP: (52-73)/(8-29) 65/17 CVP:  [3 mmHg-15 mmHg] 15 mmHg CO:  [4.5 L/min-6.8 L/min] 6.8 L/min CI:  [1.9 L/min/m2-2.91 L/min/m2] 2.91 L/min/m2  Intake/Output from previous day: 01/02 0701 - 01/03 0700 In: 1825.7 [P.O.:720; I.V.:848.7; IV Piggyback:257.1] Out: 3185 [Urine:2750; Drains:25; Chest Tube:410] Intake/Output this shift: Total I/O In: 278.5 [P.O.:240; I.V.:38.5] Out: 200 [Urine:200]  General appearance: alert, cooperative, and no distress Neurologic: intact Heart: VAD Lungs: clear to auscultation bilaterally Wound: sternal dressing clean, + serous drainage from drain site right upper chest  Lab Results: Recent Labs    10/16/24 0414 10/16/24 0451 10/17/24 0438 10/17/24 0442  WBC 21.5*  --   --  18.0*  HGB 8.6*   < > 7.8* 8.0*  HCT 25.4*   < > 23.0* 23.6*  PLT 124*  --   --  143*   < > = values in this interval not displayed.   BMET:  Recent Labs    10/17/24 0002 10/17/24 0438 10/17/24 0442  NA 125* 129* 127*  K 4.0 3.3* 3.7  CL 91*  --  92*  CO2 22  --  23  GLUCOSE 151*  --  129*  BUN 35*  --  35*  CREATININE 1.90*  --  1.81*  CALCIUM  8.5*  --  8.7*    PT/INR:  Recent Labs    10/17/24 0442  LABPROT 17.4*  INR 1.4*   ABG    Component Value Date/Time   PHART 7.432  10/17/2024 0438   HCO3 23.6 10/17/2024 0438   TCO2 25 10/17/2024 0438   ACIDBASEDEF 1.0 10/17/2024 0438   O2SAT 56.5 10/17/2024 0542   CBG (last 3)  Recent Labs    10/17/24 0008 10/17/24 0347 10/17/24 0801  GLUCAP 155* 140* 215*    Assessment/Plan: S/P Procedures (LRB): INSERTION OF IMPLANTABLE LEFT VENTRICULAR ASSIST DEVICE HEARTMATE 3; REMOVAL OF IMPELLA 5.5 (N/A) ECHOCARDIOGRAM, TRANSESOPHAGEAL, INTRAOPERATIVE (N/A) POD # 3 CV- LVAD interrogation  Speed 5300, flow 4.9 L/min, power 3.4, PI 3.3  On milrinone  0.35, epi 3  PA 66/17, CI 2.9  Pressors per AHF  Amiodarone  at 30  On warfarin- INR 1.4 RESP- 2L [place] 100 % sat RENAL- creatinine 1.8 down from 2.1 last night  - 1.3 L on Lasix  drip at 20  Weight up 9 pounds ? ENDO- CBG mild to moderately elevated  Adjust insulin  Dc LVAD pocket drain - only 25 ml last 24 hours Keep chest tube   LOS: 16 days    Elspeth JAYSON Millers 10/17/2024

## 2024-10-17 NOTE — Progress Notes (Signed)
 Drive Line: Existing VAD dressing removed and site care performed using sterile technique. Drive line exit site cleaned with Chlora prep applicators x 2, allowed to dry, and gauze dressing with Silverlon patch applied. Exit site unincorporated, the velour is fully implanted at exit site. Moderate amount of serosanguinous drainage present on previous dressing. No redness, tenderness, foul odor or rash noted. Cath grip anchor re-applied. Continue daily dressing changes by VAD coordinator or nurse champion. Next dressing change due 10/18/24.

## 2024-10-17 NOTE — Progress Notes (Signed)
 PHARMACY - ANTICOAGULATION CONSULT NOTE  Pharmacy Consult for warfarin Indication: LVAD HM3 + hx AFib  Allergies[1]  Patient Measurements: Height: 6' 6 (198.1 cm) Weight: 110.8 kg (244 lb 4.3 oz) IBW/kg (Calculated) : 91.4 HEPARIN  DW (KG): 104.3  Vital Signs: Temp: 98.8 F (37.1 C) (01/03 0700) Temp Source: Core (01/03 0000) BP: 86/72 (01/03 0400) Pulse Rate: 70 (01/03 0700)  Labs: Recent Labs    10/14/24 1424 10/14/24 2000 10/15/24 0400 10/15/24 0557 10/15/24 1613 10/15/24 1614 10/16/24 0414 10/16/24 0451 10/16/24 1411 10/16/24 1845 10/17/24 0002 10/17/24 0438 10/17/24 0442  HGB 8.7*   < > 7.8*   < > 8.6*   < > 8.6* 9.2*  --   --   --  7.8* 8.0*  HCT 25.6*   < > 22.7*   < > 24.9*   < > 25.4* 27.0*  --   --   --  23.0* 23.6*  PLT 79*  77*   < > 94*  --  95*  --  124*  --   --   --   --   --  143*  APTT 34  34  --   --   --   --   --   --   --   --   --   --   --   --   LABPROT 16.3*  16.0*  --  15.7*  --   --   --  16.4*  --   --   --   --   --  17.4*  INR 1.2  1.2  --  1.2  --   --   --  1.3*  --   --   --   --   --  1.4*  CREATININE 1.50*   < > 1.55*  --  1.59*  --  1.62*  --    < > 2.11* 1.90*  --  1.81*   < > = values in this interval not displayed.    Estimated Creatinine Clearance: 58.6 mL/min (A) (by C-G formula based on SCr of 1.81 mg/dL (H)).   Medical History: Past Medical History:  Diagnosis Date   CKD (chronic kidney disease) stage 2, GFR 60-89 ml/min    COPD (chronic obstructive pulmonary disease) (HCC)    Diabetes mellitus type II, non insulin  dependent (HCC)    Dilated aortic root    Elevated PSA    Hypercholesteremia    Hypertension    NICM (nonischemic cardiomyopathy) (HCC)    NSVT (nonsustained ventricular tachycardia) (HCC)    Obstructive sleep apnea    Paroxysmal atrial fibrillation (HCC)    Pulmonary hypertension (HCC)    PVC's (premature ventricular contractions)    Tobacco abuse     Medications:  Scheduled:    acetaminophen   1,000 mg Oral Q6H   Or   acetaminophen  (TYLENOL ) oral liquid 160 mg/5 mL  1,000 mg Per Tube Q6H   bisacodyl   10 mg Oral Daily   Or   bisacodyl   10 mg Rectal Daily   Chlorhexidine  Gluconate Cloth  6 each Topical Daily   docusate sodium   100 mg Oral Daily   gabapentin   200 mg Oral QHS   insulin  aspart  0-24 Units Subcutaneous Q4H   insulin  glargine  10 Units Subcutaneous Daily   melatonin  3 mg Oral QHS   metoCLOPramide  (REGLAN ) injection  10 mg Intravenous Q6H   mexiletine  200 mg Oral Q8H   pantoprazole   40 mg Oral Daily  polyethylene glycol  17 g Oral Daily   potassium chloride   40 mEq Oral BID   senna  2 tablet Oral Daily   sodium chloride  flush  10-40 mL Intracatheter Q12H   sodium chloride  flush  3 mL Intravenous Q12H   Warfarin - Pharmacist Dosing Inpatient   Does not apply q1600    Assessment: James [name] who underwent LVAD HM3 placement on 12/31. Was on apixaban  PTA for hx Afib > now with LVAD plan to change to warfarin.   10/17/24: INR 1.4 today, slight increase after warfarin initiated yesterday. Hgb decreased to 8.0, PLT increased to 143. LDH 615. No s/sx of bleeding noted. Oral intake improving today, requested Ensure for breakfast.   Goal of Therapy:  INR 2-2.5 Monitor platelets by anticoagulation protocol: Yes   Plan:  Warfarin 2 mg tonight  Monitor daily INR, CBC, and for s/sx of bleeding   Thank you for allowing pharmacy to participate in this patient's care,  Morna Breach, PharmD, BCPS PGY2 Cardiology Pharmacy Resident 10/17/2024 7:44 AM  Please check AMION for all [facility] Pharmacy phone numbers After 10:00 PM, call Main Pharmacy [phone]     [1] No Known Allergies

## 2024-10-18 ENCOUNTER — Inpatient Hospital Stay (HOSPITAL_COMMUNITY)

## 2024-10-18 DIAGNOSIS — I5023 Acute on chronic systolic (congestive) heart failure: Secondary | ICD-10-CM | POA: Diagnosis not present

## 2024-10-18 DIAGNOSIS — I272 Pulmonary hypertension, unspecified: Secondary | ICD-10-CM | POA: Diagnosis not present

## 2024-10-18 DIAGNOSIS — I472 Ventricular tachycardia, unspecified: Secondary | ICD-10-CM | POA: Diagnosis not present

## 2024-10-18 DIAGNOSIS — R570 Cardiogenic shock: Secondary | ICD-10-CM | POA: Diagnosis not present

## 2024-10-18 DIAGNOSIS — Z95811 Presence of heart assist device: Secondary | ICD-10-CM | POA: Diagnosis not present

## 2024-10-18 DIAGNOSIS — I480 Paroxysmal atrial fibrillation: Secondary | ICD-10-CM | POA: Diagnosis not present

## 2024-10-18 LAB — TRIGLYCERIDES: Triglycerides: 80 mg/dL

## 2024-10-18 LAB — BASIC METABOLIC PANEL WITH GFR
Anion gap: 12 (ref 5–15)
Anion gap: 12 (ref 5–15)
BUN: 35 mg/dL — ABNORMAL HIGH (ref 8–23)
BUN: 39 mg/dL — ABNORMAL HIGH (ref 8–23)
CO2: 25 mmol/L (ref 22–32)
CO2: 25 mmol/L (ref 22–32)
Calcium: 8.6 mg/dL — ABNORMAL LOW (ref 8.9–10.3)
Calcium: 8.8 mg/dL — ABNORMAL LOW (ref 8.9–10.3)
Chloride: 91 mmol/L — ABNORMAL LOW (ref 98–111)
Chloride: 89 mmol/L — ABNORMAL LOW (ref 98–111)
Creatinine, Ser: 1.56 mg/dL — ABNORMAL HIGH (ref 0.61–1.24)
Creatinine, Ser: 1.59 mg/dL — ABNORMAL HIGH (ref 0.61–1.24)
GFR, Estimated: 50 mL/min — ABNORMAL LOW
GFR, Estimated: 48 mL/min — ABNORMAL LOW
Glucose, Bld: 100 mg/dL — ABNORMAL HIGH (ref 70–99)
Glucose, Bld: 162 mg/dL — ABNORMAL HIGH (ref 70–99)
Potassium: 3.2 mmol/L — ABNORMAL LOW (ref 3.5–5.1)
Potassium: 3.5 mmol/L (ref 3.5–5.1)
Sodium: 128 mmol/L — ABNORMAL LOW (ref 135–145)
Sodium: 126 mmol/L — ABNORMAL LOW (ref 135–145)

## 2024-10-18 LAB — PROTIME-INR
INR: 1.4 — ABNORMAL HIGH (ref 0.8–1.2)
Prothrombin Time: 18.2 s — ABNORMAL HIGH (ref 11.4–15.2)

## 2024-10-18 LAB — CBC
HCT: 26.4 % — ABNORMAL LOW (ref 39.0–52.0)
Hemoglobin: 8.3 g/dL — ABNORMAL LOW (ref 13.0–17.0)
MCH: 30.7 pg (ref 26.0–34.0)
MCHC: 31.4 g/dL (ref 30.0–36.0)
MCV: 97.8 fL (ref 80.0–100.0)
Platelets: 203 K/uL (ref 150–400)
RBC: 2.7 MIL/uL — ABNORMAL LOW (ref 4.22–5.81)
RDW: 17.5 % — ABNORMAL HIGH (ref 11.5–15.5)
WBC: 12.7 K/uL — ABNORMAL HIGH (ref 4.0–10.5)
nRBC: 1.1 % — ABNORMAL HIGH (ref 0.0–0.2)

## 2024-10-18 LAB — CBC WITH DIFFERENTIAL/PLATELET
Abs Immature Granulocytes: 0.23 K/uL — ABNORMAL HIGH (ref 0.00–0.07)
Basophils Absolute: 0 K/uL (ref 0.0–0.1)
Basophils Relative: 0 %
Eosinophils Absolute: 0.1 K/uL (ref 0.0–0.5)
Eosinophils Relative: 0 %
HCT: 23.4 % — ABNORMAL LOW (ref 39.0–52.0)
Hemoglobin: 8 g/dL — ABNORMAL LOW (ref 13.0–17.0)
Immature Granulocytes: 2 %
Lymphocytes Relative: 4 %
Lymphs Abs: 0.5 K/uL — ABNORMAL LOW (ref 0.7–4.0)
MCH: 31.4 pg (ref 26.0–34.0)
MCHC: 34.2 g/dL (ref 30.0–36.0)
MCV: 91.8 fL (ref 80.0–100.0)
Monocytes Absolute: 1 K/uL (ref 0.1–1.0)
Monocytes Relative: 7 %
Neutro Abs: 11.9 K/uL — ABNORMAL HIGH (ref 1.7–7.7)
Neutrophils Relative %: 87 %
Platelets: 187 K/uL (ref 150–400)
RBC: 2.55 MIL/uL — ABNORMAL LOW (ref 4.22–5.81)
RDW: 17.2 % — ABNORMAL HIGH (ref 11.5–15.5)
WBC: 13.7 K/uL — ABNORMAL HIGH (ref 4.0–10.5)
nRBC: 0.8 % — ABNORMAL HIGH (ref 0.0–0.2)

## 2024-10-18 LAB — BLOOD GAS, ARTERIAL
Acid-Base Excess: 2.5 mmol/L — ABNORMAL HIGH (ref 0.0–2.0)
Bicarbonate: 26.3 mmol/L (ref 20.0–28.0)
O2 Saturation: 100 %
Patient temperature: 37
pCO2 arterial: 37 mmHg (ref 32–48)
pH, Arterial: 7.46 — ABNORMAL HIGH (ref 7.35–7.45)
pO2, Arterial: 115 mmHg — ABNORMAL HIGH (ref 83–108)

## 2024-10-18 LAB — GLUCOSE, CAPILLARY
Glucose-Capillary: 117 mg/dL — ABNORMAL HIGH (ref 70–99)
Glucose-Capillary: 141 mg/dL — ABNORMAL HIGH (ref 70–99)
Glucose-Capillary: 160 mg/dL — ABNORMAL HIGH (ref 70–99)
Glucose-Capillary: 165 mg/dL — ABNORMAL HIGH (ref 70–99)
Glucose-Capillary: 176 mg/dL — ABNORMAL HIGH (ref 70–99)

## 2024-10-18 LAB — COOXEMETRY PANEL
Carboxyhemoglobin: 1.6 % — ABNORMAL HIGH (ref 0.5–1.5)
Carboxyhemoglobin: 1.7 % — ABNORMAL HIGH (ref 0.5–1.5)
Carboxyhemoglobin: 2 % — ABNORMAL HIGH (ref 0.5–1.5)
Methemoglobin: <0.7 % (ref 0.0–1.5)
Methemoglobin: <0.7 % (ref 0.0–1.5)
Methemoglobin: <0.7 % (ref 0.0–1.5)
O2 Saturation: 52.6 %
O2 Saturation: 41.6 %
O2 Saturation: 51.1 %
Total hemoglobin: 8 g/dL — ABNORMAL LOW (ref 12.0–16.0)
Total hemoglobin: 8.5 g/dL — ABNORMAL LOW (ref 12.0–16.0)
Total hemoglobin: 8.1 g/dL — ABNORMAL LOW (ref 12.0–16.0)

## 2024-10-18 LAB — MAGNESIUM
Magnesium: 2.2 mg/dL (ref 1.7–2.4)
Magnesium: 2.2 mg/dL (ref 1.7–2.4)

## 2024-10-18 LAB — PHOSPHORUS: Phosphorus: 2.3 mg/dL — ABNORMAL LOW (ref 2.5–4.6)

## 2024-10-18 LAB — LACTATE DEHYDROGENASE: LDH: 541 U/L — ABNORMAL HIGH (ref 105–235)

## 2024-10-18 MED ORDER — POTASSIUM CHLORIDE 20 MEQ PO PACK
40.0000 meq | PACK | Freq: Once | ORAL | Status: AC
  Administered 2024-10-18: 40 meq via ORAL
  Filled 2024-10-18: qty 2

## 2024-10-18 MED ORDER — INSULIN ASPART 100 UNIT/ML IJ SOLN
0.0000 [IU] | Freq: Every day | INTRAMUSCULAR | Status: DC
  Administered 2024-10-18: 3 [IU] via SUBCUTANEOUS
  Administered 2024-10-20: 2 [IU] via SUBCUTANEOUS
  Filled 2024-10-18: qty 3
  Filled 2024-10-18: qty 5
  Filled 2024-10-18: qty 2

## 2024-10-18 MED ORDER — POTASSIUM CHLORIDE 20 MEQ PO PACK
40.0000 meq | PACK | Freq: Once | ORAL | Status: AC
  Administered 2024-10-18: 40 meq via ORAL

## 2024-10-18 MED ORDER — METOLAZONE 5 MG PO TABS
5.0000 mg | ORAL_TABLET | Freq: Once | ORAL | Status: AC
  Administered 2024-10-18: 5 mg via ORAL
  Filled 2024-10-18: qty 1

## 2024-10-18 MED ORDER — WARFARIN SODIUM 2 MG PO TABS
4.0000 mg | ORAL_TABLET | Freq: Once | ORAL | Status: AC
  Administered 2024-10-18: 4 mg via ORAL
  Filled 2024-10-18: qty 2

## 2024-10-18 MED ORDER — INSULIN GLARGINE-YFGN 100 UNIT/ML ~~LOC~~ SOLN
20.0000 [IU] | Freq: Every day | SUBCUTANEOUS | Status: DC
  Administered 2024-10-19: 20 [IU] via SUBCUTANEOUS
  Filled 2024-10-18 (×2): qty 0.2

## 2024-10-18 MED ORDER — ACETAZOLAMIDE 250 MG PO TABS
500.0000 mg | ORAL_TABLET | Freq: Four times a day (QID) | ORAL | Status: AC
  Administered 2024-10-18 (×2): 500 mg via ORAL
  Filled 2024-10-18 (×2): qty 2

## 2024-10-18 MED ORDER — POTASSIUM CHLORIDE CRYS ER 20 MEQ PO TBCR: 40.0000 meq | EXTENDED_RELEASE_TABLET | Freq: Once | ORAL | Status: DC

## 2024-10-18 MED ORDER — INSULIN ASPART 100 UNIT/ML IJ SOLN
0.0000 [IU] | Freq: Three times a day (TID) | INTRAMUSCULAR | Status: DC
  Administered 2024-10-18 – 2024-10-19 (×3): 3 [IU] via SUBCUTANEOUS
  Administered 2024-10-19: 5 [IU] via SUBCUTANEOUS
  Administered 2024-10-20: 2 [IU] via SUBCUTANEOUS
  Administered 2024-10-20: 1 [IU] via SUBCUTANEOUS
  Administered 2024-10-20: 8 [IU] via SUBCUTANEOUS
  Administered 2024-10-21 (×2): 3 [IU] via SUBCUTANEOUS
  Administered 2024-10-21: 8 [IU] via SUBCUTANEOUS
  Administered 2024-10-22 – 2024-10-23 (×4): 5 [IU] via SUBCUTANEOUS
  Administered 2024-10-23: 3 [IU] via SUBCUTANEOUS
  Administered 2024-10-24 (×2): 2 [IU] via SUBCUTANEOUS
  Administered 2024-10-24: 5 [IU] via SUBCUTANEOUS
  Administered 2024-10-25 – 2024-10-28 (×6): 2 [IU] via SUBCUTANEOUS
  Administered 2024-10-29: 3 [IU] via SUBCUTANEOUS
  Administered 2024-10-29 – 2024-11-02 (×4): 2 [IU] via SUBCUTANEOUS
  Filled 2024-10-18: qty 1
  Filled 2024-10-18: qty 4
  Filled 2024-10-18: qty 3
  Filled 2024-10-18 (×4): qty 2
  Filled 2024-10-18 (×3): qty 3
  Filled 2024-10-18: qty 8
  Filled 2024-10-18: qty 2
  Filled 2024-10-18: qty 3
  Filled 2024-10-18 (×2): qty 2
  Filled 2024-10-18: qty 5
  Filled 2024-10-18: qty 2
  Filled 2024-10-18: qty 8
  Filled 2024-10-18: qty 2
  Filled 2024-10-18: qty 5
  Filled 2024-10-18: qty 8
  Filled 2024-10-18 (×2): qty 5
  Filled 2024-10-18: qty 8
  Filled 2024-10-18: qty 3
  Filled 2024-10-18: qty 9
  Filled 2024-10-18: qty 2
  Filled 2024-10-18: qty 3
  Filled 2024-10-18 (×2): qty 2

## 2024-10-18 NOTE — Progress Notes (Signed)
 PHARMACY - ANTICOAGULATION CONSULT NOTE  Pharmacy Consult for warfarin Indication: LVAD HM3 + hx AFib  Allergies[1]  Patient Measurements: Height: 6' 6 (198.1 cm) Weight: 110.4 kg (243 lb 6.2 oz) IBW/kg (Calculated) : 91.4 HEPARIN  DW (KG): 104.3  Vital Signs: Temp: 98.8 F (37.1 C) (01/04 0615) Temp Source: Core (01/04 0400) BP: 99/66 (01/04 0900) Pulse Rate: 116 (01/04 0900)  Labs: Recent Labs    10/16/24 0414 10/16/24 0451 10/17/24 0438 10/17/24 0442 10/17/24 2030 10/18/24 0514  HGB 8.6*   < > 7.8* 8.0*  --  8.0*  HCT 25.4*   < > 23.0* 23.6*  --  23.4*  PLT 124*  --   --  143*  --  187  LABPROT 16.4*  --   --  17.4*  --  18.2*  INR 1.3*  --   --  1.4*  --  1.4*  CREATININE 1.62*   < >  --  1.81* 1.72* 1.56*   < > = values in this interval not displayed.    Estimated Creatinine Clearance: 67.9 mL/min (A) (by C-G formula based on SCr of 1.56 mg/dL (H)).   Medical History: Past Medical History:  Diagnosis Date   CKD (chronic kidney disease) stage 2, GFR 60-89 ml/min    COPD (chronic obstructive pulmonary disease) (HCC)    Diabetes mellitus type II, non insulin  dependent (HCC)    Dilated aortic root    Elevated PSA    Hypercholesteremia    Hypertension    NICM (nonischemic cardiomyopathy) (HCC)    NSVT (nonsustained ventricular tachycardia) (HCC)    Obstructive sleep apnea    Paroxysmal atrial fibrillation (HCC)    Pulmonary hypertension (HCC)    PVC's (premature ventricular contractions)    Tobacco abuse     Medications:  Scheduled:   acetaminophen   1,000 mg Oral Q6H   Or   acetaminophen  (TYLENOL ) oral liquid 160 mg/5 mL  1,000 mg Per Tube Q6H   bisacodyl   10 mg Oral Daily   Or   bisacodyl   10 mg Rectal Daily   Chlorhexidine  Gluconate Cloth  6 each Topical Daily   docusate sodium   100 mg Oral Daily   gabapentin   200 mg Oral QHS   insulin  aspart  0-15 Units Subcutaneous TID WC   insulin  aspart  0-5 Units Subcutaneous QHS   insulin  glargine  20  Units Subcutaneous Daily   melatonin  3 mg Oral QHS   metoCLOPramide  (REGLAN ) injection  10 mg Intravenous Q6H   mexiletine  200 mg Oral Q8H   pantoprazole   40 mg Oral Daily   polyethylene glycol  17 g Oral Daily   potassium chloride   40 mEq Oral BID   senna  2 tablet Oral Daily   sildenafil   20 mg Oral TID   sodium chloride  flush  10-40 mL Intracatheter Q12H   sodium chloride  flush  3 mL Intravenous Q12H   spironolactone   25 mg Oral Daily   Warfarin - Pharmacist Dosing Inpatient   Does not apply q1600    Assessment: 63 yom who underwent LVAD HM3 placement on 12/31. Was on apixaban  PTA for hx Afib > now with LVAD plan to change to warfarin.   10/18/24: INR remains 1.4 today after 2 doses of warfarin. Hgb remains 8.0, PLT stable at 187. LDH 541. No s/sx of bleeding noted. Oral intake improving today, eating 100% of meals.   Goal of Therapy:  INR 2-2.5 Monitor platelets by anticoagulation protocol: Yes   Plan:  Warfarin  4 mg tonight  Monitor daily INR, CBC, and for s/sx of bleeding   Thank you for allowing pharmacy to participate in this patient's care,  Morna Breach, PharmD, BCPS PGY2 Cardiology Pharmacy Resident 10/18/2024 11:11 AM  Please check AMION for all [facility] Pharmacy phone numbers After 10:00 PM, call Main Pharmacy [phone]    [1] No Known Allergies

## 2024-10-18 NOTE — Plan of Care (Signed)
" °  Problem: Education: Goal: Understanding of CV disease, CV risk reduction, and recovery process will improve Outcome: Progressing   Problem: Activity: Goal: Ability to return to baseline activity level will improve Outcome: Progressing   Problem: Cardiovascular: Goal: Ability to achieve and maintain adequate cardiovascular perfusion will improve Outcome: Progressing   Problem: Cardiovascular: Goal: Vascular access site(s) Level 0-1 will be maintained Outcome: Progressing   Problem: Health Behavior/Discharge Planning: Goal: Ability to safely manage health-related needs after discharge will improve Outcome: Progressing   Problem: Education: Goal: Knowledge of General Education information will improve Description: Including pain rating scale, medication(s)/side effects and non-pharmacologic comfort measures Outcome: Progressing   "

## 2024-10-18 NOTE — Progress Notes (Signed)
 "  NAME:  James [name], MRN:  [id number], DOB:  27-Jan-1961, LOS: 17 ADMISSION DATE:  10/01/2024, CONSULTATION DATE:  10/14/24 REFERRING MD:  Lucas , CHIEF COMPLAINT:  s/p LVAD   History of Present Illness:  64 yo M with stg D cardiomyopathy + inotrope dependence, CKD 3a, hx VT, pAFib, meningioma who was admitted to HF service 10/01/24 for pre-VAD optimization  This included: RHC, milrinone  titration, impella placement, levo and DBA titration and volume optimization  On 10/14/24 he went for LVAD  Pump: 116 min  EBL: 750 Product: 2 plt 4 ffp 708 cellsaver  PCCM is consulted post op ICU management.  Pertinent  Medical History  HFrEF Inotrope dependence VT pAFib  Prostate cancer Meningioma  HTN DM2   Significant Hospital Events: Including procedures, antibiotic start and stop dates in addition to other pertinent events    10/01/24 RHC w low output and pHTN, and subsequent uptitration of milrinone  from 0.25 to 0.5 10/02/24 started ND 10/05/24 high PA pressures, hignPCWP high SVR. 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 Hep to bival 10/13/24 not likely HIT. Milrinone  to 0.375  10/14/24 LVAD. PCCM consult.  10/26/23 extubated, nitric off, 1u prbc, lasix   Interim History / Subjective:  No new issues he is doing well. He continues to be on amiodarone , epinephrine , Lasix  drip along with milrinone .  Objective    Blood pressure (!) 86/77, pulse (!) 116, temperature 98.8 F (37.1 C), resp. rate (!) 21, height 6' 6 (1.981 m), weight 110.4 kg, SpO2 96%. PAP: (34-77)/(10-44) 45/29 CVP:  [5 mmHg-87 mmHg] 16 mmHg CO:  [5.2 L/min-5.6 L/min] 5.2 L/min CI:  [2.2 L/min/m2-2.4 L/min/m2] 2.2 L/min/m2      Intake/Output Summary (Last 24 hours) at 10/18/2024 1655 Last data filed at 10/18/2024 1300 Gross per 24 hour  Intake 2670.23 ml  Output 3010 ml  Net  -339.77 ml   Filed Weights   10/16/24 0500 10/17/24 0500 10/18/24 0500  Weight: 106.8 kg 110.8 kg 110.4 kg    Examination: Physical exam: General: Chronically ill-appearing male, lying on the bed HEENT: [place]/AT, eyes anicteric.  moist mucus membranes Neuro: Alert, awake following commands Chest: Coarse breath sounds, no wheezes or rhonchi Heart: Regular rate and rhythm, no murmurs or gallops LVAD hum noted. Abdomen: Soft, nontender, nondistended, bowel sounds present    LVAD  Speed 5300 Flow 5 L,PMpower 3.7, PI 3.2 Milrinone  0.25 Epi 3 Amio 30   CVP PA CI  coox 41.6%  Resolved problem list   Assessment and Plan   Acute on chronic HFrEF, stage D cardiomyopathy s/p LVAD (HM3 10/14/24)  pHTN, acute on chronic RV failure   PVCs, NSVT  pAFib  -post-op care per TCTS - Pleural drain in place.  Mediastinal chest tubes removed yesterday.  And blake drain from impella site  - coumadin  this evening per pharmacy  - Continue milrinone , epinephrine  and Amio drips as above. -Lasix  drip is continued at 20 mg/h. - con't mexiletine 200mg  q8h  - complete perioperative ancef   - mobility as able   Acute hypoxic respiratory failure 2/2 pulmonary edema, deconditioning  - O2 wean for sat >92%  - mobility -> oob if tolerates  - incentive spirometry   LUE swelling  - obtain venous duplex DVT study   Leukocytosis  May be all post-operative, critical illness reactivity; no true fevers  - trend fever, wbc curves  - still on perioperative  antibiotics   CKD 3a Hyponatremia  Nephro labs stable.  Hyponatremia ongoing 130>128, may be now oral water  intake contributing in addition to his HF pathophys  - monitor  - drink solutes in addition to water  I.e. gatorade etc.  - renal perfusion, renally dose meds, elyte replacement, strict I/O  DM2 w hyperglycemia  - ssi resistant scale  -  lantus  0 units daily  - cbg q4h   Anemia; expected operative blood loss.  Thrombocytopenia; not  HIT - trend  - transfuse prn   Labs   CBC: Recent Labs  Lab 10/15/24 0400 10/15/24 0557 10/15/24 1613 10/15/24 1614 10/16/24 0414 10/16/24 0451 10/17/24 0438 10/17/24 0442 10/18/24 0514  WBC 13.1*  --  15.7*  --  21.5*  --   --  18.0* 13.7*  NEUTROABS 11.6*  --   --   --  19.3*  --   --  15.9* 11.9*  HGB 7.8*   < > 8.6*   < > 8.6* 9.2* 7.8* 8.0* 8.0*  HCT 22.7*   < > 24.9*   < > 25.4* 27.0* 23.0* 23.6* 23.4*  MCV 92.3  --  90.5  --  92.4  --   --  91.8 91.8  PLT 94*  --  95*  --  124*  --   --  143* 187   < > = values in this interval not displayed.    Basic Metabolic Panel: Recent Labs  Lab 10/13/24 0514 10/13/24 0515 10/15/24 0400 10/15/24 0557 10/15/24 1613 10/15/24 1614 10/16/24 0414 10/16/24 0451 10/16/24 1845 10/17/24 0002 10/17/24 0438 10/17/24 0442 10/17/24 2030 10/18/24 0514  NA  --    < > 133*   < > 130*   < > 128*   < > 126* 125* 129* 127* 125* 128*  K  --    < > 4.0   < > 3.7   < > 4.0   < > 3.7 4.0 3.3* 3.7 3.6 3.2*  CL  --    < > 99  --  95*  --  94*   < > 92* 91*  --  92* 90* 91*  CO2  --    < > 22  --  21*  --  22   < > 19* 22  --  23 24 25   GLUCOSE  --    < > 119*  --  129*  --  154*   < > 234* 151*  --  129* 153* 100*  BUN  --    < > 24*  --  22  --  24*   < > 35* 35*  --  35* 38* 35*  CREATININE  --    < > 1.55*  --  1.59*  --  1.62*   < > 2.11* 1.90*  --  1.81* 1.72* 1.56*  CALCIUM   --    < > 9.2  --  8.8*  --  9.0   < > 8.8* 8.5*  --  8.7* 8.6* 8.6*  MG  --    < > 2.3  --  1.9  --  2.0  --   --   --   --  1.8 1.9 2.2  PHOS 1.9*  --  3.3  --   --   --  4.4  --   --   --   --  3.3  --  2.3*   < > = values in this interval not displayed.   GFR:  Estimated Creatinine Clearance: 67.9 mL/min (A) (by C-G formula based on SCr of 1.56 mg/dL (H)). Recent Labs  Lab 10/15/24 1613 10/16/24 0414 10/16/24 1706 10/17/24 0442 10/18/24 0514  WBC 15.7* 21.5*  --  18.0* 13.7*  LATICACIDVEN  --   --  1.6  --   --     Liver Function Tests: Recent  Labs  Lab 10/15/24 0400 10/16/24 0414 10/17/24 0442  AST 82* 102* 56*  ALT 16 15 <5  ALKPHOS 90 87 90  BILITOT 1.3* 1.2 0.6  PROT 5.3* 5.9* 5.7*  ALBUMIN  3.4* 3.6 3.3*     ABG    Component Value Date/Time   PHART 7.46 (H) 10/18/2024 0514   PCO2ART 37 10/18/2024 0514   PO2ART 115 (H) 10/18/2024 0514   HCO3 26.3 10/18/2024 0514   TCO2 25 10/17/2024 0438   ACIDBASEDEF 1.0 10/17/2024 0438   O2SAT 41.6 10/18/2024 1616     Critical care time: 51 MINUTES     Tamela Stakes, MD  Attending Physician, Critical Care Medicine [facility] See Amion for pager If no response to pager, please call [phone] until 7pm After 7pm, Please call E-link [phone]     "

## 2024-10-18 NOTE — Progress Notes (Signed)
 "    Advanced Heart Failure Rounding Note   [facility] Cardiologist: Dr. Zenaida  Patient Profile   James [name] is a 64 y.o. male with history of stage D cardiomyopathy/chronic systolic heart failure now end-stage w/ inotrope dependence, CKD IIIa, hx VT, PAF, prostate cancer, meningioma.   Admitted for LVAD implant/ pre-VAD HF optimization.   Significant events:   12/18: Milrinone  titrated from 0.25 to 0.5 mcg/kg/min post RHC d/t low-output and pulmonary hypertension 12/19: NE added 12/22: Markedly elevated PA pressures, elevated PCWP and elevated SVR. Titrated off NE. Added DBA.  12/23: Impella 5.5 Implanted.  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 Heparin  > Bival.  12/31: HM III LVAD implant  Subjective:    POD # 4  Urine output picking up on drip, but still volume up based on swan and exam.   Continue lasix  gtt at 20, augment with diamox  and metolazone .   Continue epi at 3 and 0.25 of milrinone . Would not wean today until CVP improved.  PAP: (34-77)/(4-44) 45/29 CVP:  [1 mmHg-87 mmHg] 16 mmHg CO:  [5.2 L/min-5.6 L/min] 5.2 L/min CI:  [2.2 L/min/m2-2.4 L/min/m2] 2.2 L/min/m2  Maintain speed at 5300 today, may be able to increase tomorrow after diuresis.   HeartMate 3 VAD Equipment Check Pump Speed (RPM): 5300 RPM Pump Flow (LPM): 5 Power (Watts): 4 Watts Pulsatility Index: 3.3 Fixed Speed Limit: 5300 rpm Low Speed Limit: 5000 rpm Alarms: No alarms Auscultated: Normal expected humming Power Module Self-Test (Daily): Done System Controller Self-Test: Passed Patient Battery Source: [facility] / Wall unit Emergency Equipment at Bedside: Yes  Objective:     Weight Range: 110.4 kg Body mass index is 28.13 kg/m.   Vital Signs:   Temp:  [98.1 F (36.7 C)-99.5 F (37.5 C)] 98.8 F (37.1 C) (01/04 1300) Pulse Rate:  [67-217] 116 (01/04 1300) Resp:  [8-29] 21 (01/04 1300) BP: (74-99)/(61-85) 86/77 (01/04 1200) SpO2:  [50 %-100 %] 96 % (01/04 1300) Arterial Line BP: (68-106)/(53-85)  90/66 (01/04 1300) Weight:  [110.4 kg] 110.4 kg (01/04 0500) Last BM Date : (S) 10/18/23  Weight change: Filed Weights   10/16/24 0500 10/17/24 0500 10/18/24 0500  Weight: 106.8 kg 110.8 kg 110.4 kg   Intake/Output:  Intake/Output Summary (Last 24 hours) at 10/18/2024 1456 Last data filed at 10/18/2024 1300 Gross per 24 hour  Intake 2747.38 ml  Output 3040 ml  Net -292.62 ml    Physical Exam   Physical Exam: General:  Well appearing. No resp difficulty Cor: Mechanical heart sounds with LVAD hum present. JVP moderately elevated, 1+ whole body edema Lungs: Normal WOB Abdomen: soft, nontender, nondistended.  Driveline: C/D/I; securement device intact and driveline incorporated Neuro: alert & orientedx3, cranial nerves grossly intact. moves all 4 extremities w/o difficulty.      Telemetry   V paced 80, PVCs  Labs   CBC Recent Labs    10/17/24 0442 10/18/24 0514  WBC 18.0* 13.7*  NEUTROABS 15.9* 11.9*  HGB 8.0* 8.0*  HCT 23.6* 23.4*  MCV 91.8 91.8  PLT 143* 187   Basic Metabolic Panel Recent Labs    98/96/73 0442 10/17/24 2030 10/18/24 0514  NA 127* 125* 128*  K 3.7 3.6 3.2*  CL 92* 90* 91*  CO2 23 24 25   GLUCOSE 129* 153* 100*  BUN 35* 38* 35*  CREATININE 1.81* 1.72* 1.56*  CALCIUM  8.7* 8.6* 8.6*  MG 1.8 1.9 2.2  PHOS 3.3  --  2.3*   Liver Function Tests Recent Labs  10/16/24 0414 10/17/24 0442  AST 102* 56*  ALT 15 <5  ALKPHOS 87 90  BILITOT 1.2 0.6  PROT 5.9* 5.7*  ALBUMIN  3.6 3.3*    BNP (last 3 results) Recent Labs    06/10/24 1213 07/09/24 1649 07/23/24 0959  BNP 1,071.5* 3,316.0* 595.8*   ProBNP (last 3 results) Recent Labs    10/01/24 1030 10/15/24 0400  PROBNP 8,374.0* 9,508.0*   Medications:    Scheduled Medications:  acetaminophen   1,000 mg Oral Q6H   Or   acetaminophen  (TYLENOL ) oral liquid 160 mg/5 mL  1,000 mg Per Tube Q6H   bisacodyl   10 mg Oral Daily   Or   bisacodyl   10 mg Rectal Daily   Chlorhexidine   Gluconate Cloth  6 each Topical Daily   docusate sodium   100 mg Oral Daily   gabapentin   200 mg Oral QHS   insulin  aspart  0-15 Units Subcutaneous TID WC   insulin  aspart  0-5 Units Subcutaneous QHS   insulin  glargine  20 Units Subcutaneous Daily   melatonin  3 mg Oral QHS   metoCLOPramide  (REGLAN ) injection  10 mg Intravenous Q6H   mexiletine  200 mg Oral Q8H   pantoprazole   40 mg Oral Daily   polyethylene glycol  17 g Oral Daily   potassium chloride   40 mEq Oral BID   senna  2 tablet Oral Daily   sildenafil   20 mg Oral TID   sodium chloride  flush  10-40 mL Intracatheter Q12H   sodium chloride  flush  3 mL Intravenous Q12H   spironolactone   25 mg Oral Daily   warfarin  4 mg Oral ONCE-1600   Warfarin - Pharmacist Dosing Inpatient   Does not apply q1600    Infusions:  amiodarone  30 mg/hr (10/18/24 1300)   epinephrine  3 mcg/min (10/18/24 1300)   furosemide  (LASIX ) 200 mg in dextrose  5 % 100 mL (2 mg/mL) infusion 20 mg/hr (10/18/24 1300)   milrinone  0.25 mcg/kg/min (10/18/24 1300)    PRN Medications: dextrose , fentaNYL  (SUBLIMAZE ) injection, guaiFENesin -dextromethorphan , ondansetron  (ZOFRAN ) IV, mouth rinse, oxyCODONE , phenol, sodium chloride  flush, sodium chloride  flush, traMADol , traZODone   Assessment/Plan   Acute on chronic systolic CHF, NYHA IV: Nonischemic cardiomyopathy, planned LVAD. RHC 12/18 showed elevated filling pressures with mod-severe mixed pulmonary arterial/pulmonary venous hypertension and low CO despite milrinone  0.25, increased to 0.5. Impella 5.5 placed 12/23 for further optimization. - S/p HM III LVAD implant by Dr. Lucas 10/14/24 - iNO off 1/1 - Currently on 0.25 milrinone  + 3 Epi - Continue sildenafil  20mg  TID for RV support - TD CI improving - CVP 13-14, continue lasix  gtt at 20, metalzone 5, diamox  500mg  BID - Continue spironolactone  25mg  daily  Pulmonary hypertension: Severe combined pre/post capillary PH on RHC, suspect largely due to venous  remodeling. Improved with adequate unloading. - Good pulsatility, does not need further inotropic support - Continue lasix  gtt as above  AKI on CKD stage 3: Scr continuing to improve with aggressive diuresis. - Diuresis as above  PVCs/NSVT: Improved. Has Biotronik ICD. - Continue IV amio. - Continue mexiletine 200 TID  - Tachy therapies off for VAD implant  Paroxysmal Atrial fibrillation: Remains in NSR.  - Continue amiodarone  as above.  - Warfarin per TCTS  Meningioma: Monitoring.   Prostate cancer: Treated with radiation.   Thrombocytopenia: suspect consumptive with impella.  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  Critical care was necessary to treat or prevent imminent or life-threatening deterioration.  Critical care was time spent personally by me on the following activities: development of treatment plan with patient and/or surrogate as well as nursing, discussions with consultants, evaluation of patient's response to treatment, examination of patient, obtaining history from patient or surrogate, ordering and performing treatments and interventions, ordering and review of laboratory studies, ordering and review of radiographic studies, pulse oximetry and re-evaluation of patient's condition. .   Please visit [email or web address]: For overnight coverage please call cardiology fellow first. If fellow not available call Shock/ECMO MD on call.  For ECMO / Mechanical Support (Impella, IABP, LVAD) issues call Shock / ECMO MD on call.  "

## 2024-10-18 NOTE — Progress Notes (Signed)
 Driveline dressing changed and anchor changed by this RN with daily VAD dressing kit using sterile technique. Exit site unincorporated, no velour visible. Scant serosanguinous drainage on previous dressing. No s/sx infection noted.  Continue daily dressing changes by VAD coordinator or nurse champion.  Next dressing change due 10/19/24.   Vernell JONELLE Borg, RN 10/18/2024 10:11 PM

## 2024-10-18 NOTE — Progress Notes (Signed)
 4 Days Post-Op Procedures (LRB): INSERTION OF IMPLANTABLE LEFT VENTRICULAR ASSIST DEVICE HEARTMATE 3; REMOVAL OF IMPELLA 5.5 (N/A) ECHOCARDIOGRAM, TRANSESOPHAGEAL, INTRAOPERATIVE (N/A) Subjective: Feels well this AM In atrial fib with rate 110-120  LVAD interrogation: HeartMate III speed 5300, flow 5 L/min, power 3.7, PI 3.2 Epi 3, milrinone  0.25 Objective: Vital signs in last 24 hours: Temp:  [98.4 F (36.9 C)-99.5 F (37.5 C)] 98.8 F (37.1 C) (01/04 0615) Pulse Rate:  [67-217] 149 (01/04 0615) Cardiac Rhythm: Atrial fibrillation (01/03 2100) Resp:  [8-29] 19 (01/04 0715) BP: (74-93)/(61-85) 84/72 (01/04 0400) SpO2:  [50 %-100 %] 94 % (01/04 0615) Arterial Line BP: (68-117)/(53-92) 87/81 (01/04 0700) Weight:  [110.4 kg] 110.4 kg (01/04 0500)  Hemodynamic parameters for last 24 hours: PAP: (42-77)/(4-44) 55/26 CVP:  [1 mmHg-33 mmHg] 16 mmHg CO:  [5.6 L/min] 5.6 L/min CI:  [2.4 L/min/m2] 2.4 L/min/m2  Intake/Output from previous day: 01/03 0701 - 01/04 0700 In: 2836.2 [P.O.:1740; I.V.:1019; IV Piggyback:77.1] Out: 2880 [Urine:2800; Chest Tube:80] Intake/Output this shift: No intake/output data recorded.  General appearance: alert, cooperative, and no distress Neurologic: intact Heart: VAD hum Lungs: clear to auscultation bilaterally Wound: serous drainage right deltopectoral drain site, other incisions intact and dry  Lab Results: Recent Labs    10/17/24 0442 10/18/24 0514  WBC 18.0* 13.7*  HGB 8.0* 8.0*  HCT 23.6* 23.4*  PLT 143* 187   BMET:  Recent Labs    10/17/24 2030 10/18/24 0514  NA 125* 128*  K 3.6 3.2*  CL 90* 91*  CO2 24 25  GLUCOSE 153* 100*  BUN 38* 35*  CREATININE 1.72* 1.56*  CALCIUM  8.6* 8.6*    PT/INR:  Recent Labs    10/18/24 0514  LABPROT 18.2*  INR 1.4*   ABG    Component Value Date/Time   PHART 7.46 (H) 10/18/2024 0514   HCO3 26.3 10/18/2024 0514   TCO2 25 10/17/2024 0438   ACIDBASEDEF 1.0 10/17/2024 0438   O2SAT  52.6 10/18/2024 0559   CBG (last 3)  Recent Labs    10/17/24 2310 10/18/24 0325 10/18/24 0636  GLUCAP 209* 117* 141*    Assessment/Plan: S/P Procedures (LRB): INSERTION OF IMPLANTABLE LEFT VENTRICULAR ASSIST DEVICE HEARTMATE 3; REMOVAL OF IMPELLA 5.5 (N/A) ECHOCARDIOGRAM, TRANSESOPHAGEAL, INTRAOPERATIVE (N/A) NEURO- intact CV- POD # 4 HM III LVAD, impella removal  LVAD functioning well PA pressure stable, CVP 16 Milrinone  0.25, epi 3, co-ox 53 Remains in A fib with mildly elevated rate- on amiodarone  RESP- no issues  Minimal drainage form left pleural tube- dc RENAL- creatinine decreasing- down to 1.56  On Lasix  drip, spironolactone , PRN metolazone   I/O about even ENDO- CBG better with increased lantus   Change SSI to [facility] and HS GI- tolerating diet Deconditioning- mobilize as tolerated Drainage from Impella drain site- serous, change dressing PRN  LOS: 17 days    James [name] 10/18/2024

## 2024-10-19 ENCOUNTER — Other Ambulatory Visit: Payer: Self-pay

## 2024-10-19 ENCOUNTER — Inpatient Hospital Stay (HOSPITAL_COMMUNITY)

## 2024-10-19 DIAGNOSIS — Z95811 Presence of heart assist device: Secondary | ICD-10-CM | POA: Diagnosis not present

## 2024-10-19 DIAGNOSIS — I5023 Acute on chronic systolic (congestive) heart failure: Secondary | ICD-10-CM | POA: Diagnosis not present

## 2024-10-19 DIAGNOSIS — I480 Paroxysmal atrial fibrillation: Secondary | ICD-10-CM | POA: Diagnosis not present

## 2024-10-19 DIAGNOSIS — I272 Pulmonary hypertension, unspecified: Secondary | ICD-10-CM | POA: Diagnosis not present

## 2024-10-19 DIAGNOSIS — I472 Ventricular tachycardia, unspecified: Secondary | ICD-10-CM | POA: Diagnosis not present

## 2024-10-19 LAB — CBC WITH DIFFERENTIAL/PLATELET
Abs Immature Granulocytes: 0.24 K/uL — ABNORMAL HIGH (ref 0.00–0.07)
Basophils Absolute: 0 K/uL (ref 0.0–0.1)
Basophils Relative: 0 %
Eosinophils Absolute: 0.1 K/uL (ref 0.0–0.5)
Eosinophils Relative: 1 %
HCT: 24.4 % — ABNORMAL LOW (ref 39.0–52.0)
Hemoglobin: 8.5 g/dL — ABNORMAL LOW (ref 13.0–17.0)
Immature Granulocytes: 2 %
Lymphocytes Relative: 4 %
Lymphs Abs: 0.5 K/uL — ABNORMAL LOW (ref 0.7–4.0)
MCH: 31.4 pg (ref 26.0–34.0)
MCHC: 34.8 g/dL (ref 30.0–36.0)
MCV: 90 fL (ref 80.0–100.0)
Monocytes Absolute: 0.9 K/uL (ref 0.1–1.0)
Monocytes Relative: 7 %
Neutro Abs: 9.9 K/uL — ABNORMAL HIGH (ref 1.7–7.7)
Neutrophils Relative %: 86 %
Platelets: 233 K/uL (ref 150–400)
RBC: 2.71 MIL/uL — ABNORMAL LOW (ref 4.22–5.81)
RDW: 17.3 % — ABNORMAL HIGH (ref 11.5–15.5)
WBC: 11.6 K/uL — ABNORMAL HIGH (ref 4.0–10.5)
nRBC: 1 % — ABNORMAL HIGH (ref 0.0–0.2)

## 2024-10-19 LAB — BASIC METABOLIC PANEL WITH GFR
Anion gap: 12 (ref 5–15)
Anion gap: 13 (ref 5–15)
BUN: 36 mg/dL — ABNORMAL HIGH (ref 8–23)
BUN: 37 mg/dL — ABNORMAL HIGH (ref 8–23)
CO2: 28 mmol/L (ref 22–32)
CO2: 29 mmol/L (ref 22–32)
Calcium: 8.7 mg/dL — ABNORMAL LOW (ref 8.9–10.3)
Calcium: 8.8 mg/dL — ABNORMAL LOW (ref 8.9–10.3)
Chloride: 88 mmol/L — ABNORMAL LOW (ref 98–111)
Chloride: 89 mmol/L — ABNORMAL LOW (ref 98–111)
Creatinine, Ser: 1.54 mg/dL — ABNORMAL HIGH (ref 0.61–1.24)
Creatinine, Ser: 1.59 mg/dL — ABNORMAL HIGH (ref 0.61–1.24)
GFR, Estimated: 50 mL/min — ABNORMAL LOW
GFR, Estimated: 48 mL/min — ABNORMAL LOW
Glucose, Bld: 172 mg/dL — ABNORMAL HIGH (ref 70–99)
Glucose, Bld: 176 mg/dL — ABNORMAL HIGH (ref 70–99)
Potassium: 3 mmol/L — ABNORMAL LOW (ref 3.5–5.1)
Potassium: 3.2 mmol/L — ABNORMAL LOW (ref 3.5–5.1)
Sodium: 129 mmol/L — ABNORMAL LOW (ref 135–145)
Sodium: 130 mmol/L — ABNORMAL LOW (ref 135–145)

## 2024-10-19 LAB — POCT I-STAT 7, (LYTES, BLD GAS, ICA,H+H)
Acid-Base Excess: 6 mmol/L — ABNORMAL HIGH (ref 0.0–2.0)
Bicarbonate: 28.7 mmol/L — ABNORMAL HIGH (ref 20.0–28.0)
Calcium, Ion: 1.1 mmol/L — ABNORMAL LOW (ref 1.15–1.40)
HCT: 26 % — ABNORMAL LOW (ref 39.0–52.0)
Hemoglobin: 8.8 g/dL — ABNORMAL LOW (ref 13.0–17.0)
O2 Saturation: 97 %
Patient temperature: 37.2
Potassium: 2.7 mmol/L — CL (ref 3.5–5.1)
Sodium: 128 mmol/L — ABNORMAL LOW (ref 135–145)
TCO2: 30 mmol/L (ref 22–32)
pCO2 arterial: 33.5 mmHg (ref 32–48)
pH, Arterial: 7.542 — ABNORMAL HIGH (ref 7.35–7.45)
pO2, Arterial: 82 mmHg — ABNORMAL LOW (ref 83–108)

## 2024-10-19 LAB — GLUCOSE, CAPILLARY
Glucose-Capillary: 164 mg/dL — ABNORMAL HIGH (ref 70–99)
Glucose-Capillary: 225 mg/dL — ABNORMAL HIGH (ref 70–99)
Glucose-Capillary: 203 mg/dL — ABNORMAL HIGH (ref 70–99)
Glucose-Capillary: 184 mg/dL — ABNORMAL HIGH (ref 70–99)

## 2024-10-19 LAB — PHOSPHORUS: Phosphorus: 2.4 mg/dL — ABNORMAL LOW (ref 2.5–4.6)

## 2024-10-19 LAB — COOXEMETRY PANEL
Carboxyhemoglobin: 0.9 % (ref 0.5–1.5)
Carboxyhemoglobin: 1.5 % (ref 0.5–1.5)
Methemoglobin: <0.7 % (ref 0.0–1.5)
Methemoglobin: <0.7 % (ref 0.0–1.5)
O2 Saturation: 43.7 %
O2 Saturation: 52.1 %
Total hemoglobin: 8.9 g/dL — ABNORMAL LOW (ref 12.0–16.0)
Total hemoglobin: 8.6 g/dL — ABNORMAL LOW (ref 12.0–16.0)

## 2024-10-19 LAB — PROTIME-INR
INR: 1.6 — ABNORMAL HIGH (ref 0.8–1.2)
Prothrombin Time: 19.6 s — ABNORMAL HIGH (ref 11.4–15.2)

## 2024-10-19 LAB — MAGNESIUM: Magnesium: 2 mg/dL (ref 1.7–2.4)

## 2024-10-19 LAB — LACTATE DEHYDROGENASE: LDH: 598 U/L — ABNORMAL HIGH (ref 105–235)

## 2024-10-19 MED ORDER — SODIUM CHLORIDE 0.9% FLUSH
10.0000 mL | Freq: Two times a day (BID) | INTRAVENOUS | Status: DC
  Administered 2024-10-19 – 2024-10-20 (×4): 10 mL
  Administered 2024-10-21: 20 mL
  Administered 2024-10-21 – 2024-10-27 (×11): 10 mL
  Administered 2024-10-28: 30 mL
  Administered 2024-10-28 – 2024-10-30 (×4): 10 mL
  Administered 2024-10-30: 20 mL
  Administered 2024-10-31 – 2024-11-01 (×3): 10 mL

## 2024-10-19 MED ORDER — ACETAZOLAMIDE 250 MG PO TABS
500.0000 mg | ORAL_TABLET | Freq: Four times a day (QID) | ORAL | Status: AC
  Administered 2024-10-19 (×2): 500 mg via ORAL
  Filled 2024-10-19 (×2): qty 2

## 2024-10-19 MED ORDER — MAGNESIUM SULFATE 2 GM/50ML IV SOLN
2.0000 g | Freq: Once | INTRAVENOUS | Status: AC
  Administered 2024-10-19: 2 g via INTRAVENOUS
  Filled 2024-10-19: qty 50

## 2024-10-19 MED ORDER — POTASSIUM CHLORIDE CRYS ER 20 MEQ PO TBCR: 40.0000 meq | EXTENDED_RELEASE_TABLET | Freq: Once | ORAL | Status: DC

## 2024-10-19 MED ORDER — SODIUM CHLORIDE 0.9% FLUSH: 10.0000 mL | INTRAVENOUS | Status: DC | PRN

## 2024-10-19 MED ORDER — WARFARIN SODIUM 2 MG PO TABS
2.0000 mg | ORAL_TABLET | Freq: Once | ORAL | Status: AC
  Administered 2024-10-19: 2 mg via ORAL
  Filled 2024-10-19: qty 1

## 2024-10-19 MED ORDER — POTASSIUM CHLORIDE CRYS ER 20 MEQ PO TBCR: 40.0000 meq | EXTENDED_RELEASE_TABLET | ORAL | Status: DC

## 2024-10-19 MED ORDER — METOLAZONE 5 MG PO TABS
5.0000 mg | ORAL_TABLET | Freq: Once | ORAL | Status: AC
  Administered 2024-10-19: 5 mg via ORAL
  Filled 2024-10-19: qty 1

## 2024-10-19 MED ORDER — POTASSIUM CHLORIDE 20 MEQ PO PACK
40.0000 meq | PACK | Freq: Once | ORAL | Status: AC
  Administered 2024-10-19: 40 meq via ORAL

## 2024-10-19 MED ORDER — POTASSIUM CHLORIDE 10 MEQ/50ML IV SOLN
10.0000 meq | INTRAVENOUS | Status: AC
  Administered 2024-10-19 (×5): 10 meq via INTRAVENOUS
  Filled 2024-10-19 (×5): qty 50

## 2024-10-19 MED ORDER — POTASSIUM CHLORIDE 10 MEQ/50ML IV SOLN
10.0000 meq | INTRAVENOUS | Status: AC
  Administered 2024-10-19 – 2024-10-20 (×6): 10 meq via INTRAVENOUS
  Filled 2024-10-19 (×6): qty 50

## 2024-10-19 MED ORDER — POTASSIUM CHLORIDE 20 MEQ PO PACK
40.0000 meq | PACK | Freq: Two times a day (BID) | ORAL | Status: DC
  Administered 2024-10-19 (×2): 40 meq via ORAL
  Filled 2024-10-19 (×2): qty 2

## 2024-10-19 MED ORDER — POTASSIUM CHLORIDE 20 MEQ PO PACK
40.0000 meq | PACK | ORAL | Status: AC
  Administered 2024-10-19 (×2): 40 meq via ORAL
  Filled 2024-10-19 (×2): qty 2

## 2024-10-19 MED ORDER — ORAL CARE MOUTH RINSE: 15.0000 mL | OROMUCOSAL | Status: DC | PRN

## 2024-10-19 MED FILL — Thrombin (Recombinant) For Soln 20000 Unit: CUTANEOUS | Qty: 1 | Status: AC

## 2024-10-19 NOTE — Plan of Care (Signed)
" °  Problem: Education: Goal: Understanding of CV disease, CV risk reduction, and recovery process will improve Outcome: Progressing   Problem: Activity: Goal: Ability to return to baseline activity level will improve Outcome: Progressing   Problem: Cardiovascular: Goal: Ability to achieve and maintain adequate cardiovascular perfusion will improve Outcome: Progressing   Problem: Cardiovascular: Goal: Vascular access site(s) Level 0-1 will be maintained Outcome: Progressing   Problem: Health Behavior/Discharge Planning: Goal: Ability to safely manage health-related needs after discharge will improve Outcome: Progressing   Problem: Health Behavior/Discharge Planning: Goal: Ability to manage health-related needs will improve Outcome: Progressing   Problem: Elimination: Goal: Will not experience complications related to bowel motility Outcome: Progressing   Problem: Coping: Goal: Level of anxiety will decrease Outcome: Progressing   Problem: Pain Managment: Goal: General experience of comfort will improve and/or be controlled Outcome: Progressing   Problem: Safety: Goal: Ability to remain free from injury will improve Outcome: Progressing   Problem: Skin Integrity: Goal: Risk for impaired skin integrity will decrease Outcome: Progressing   "

## 2024-10-19 NOTE — Progress Notes (Addendum)
 "    Advanced Heart Failure Rounding Note   [facility] Cardiologist: Dr. Zenaida  Patient Profile   James [name] is a 64 y.o. male with history of stage D cardiomyopathy/chronic systolic heart failure now end-stage w/ inotrope dependence, CKD IIIa, hx VT, PAF, prostate cancer, meningioma.   Admitted for LVAD implant/ pre-VAD HF optimization.   Significant events:   12/18: Milrinone  titrated from 0.25 to 0.5 mcg/kg/min post RHC d/t low-output and pulmonary hypertension 12/19: NE added 12/22: Markedly elevated PA pressures, elevated PCWP and elevated SVR. Titrated off NE. Added DBA.  12/23: Impella 5.5 Implanted.  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 Heparin  > Bival.  12/31: HM III LVAD implant  Subjective:    POD # 5  Good diuresis yesterday w/ Lasix  gtt, Diamox  and metolazone , 7.5L in UOP. Wt down 9 lb, still 16 lb above pre-op.  K 3.0, Scr stable at 1.5    Remains on Epi 3 + Milrinone  0.25. Co-ox pending.   CVP 5 PAP 30/20  PAPi 2   In afib, 110s.   Feels ok this morning. Sitting up in chair. No CP or dyspnea. Appetite good.   LVAD INTERROGATION:  HeartMate IIl LVAD:  Flow 5.2 liters/min, speed 5350, power 3.6, PI 2.5  MAP: 82   Objective:    Weight Range: 106.5 kg Body mass index is 27.13 kg/m.   Vital Signs:   Temp:  [94.8 F (34.9 C)-99.3 F (37.4 C)] 99.1 F (37.3 C) (01/05 0715) Pulse Rate:  [102-282] 226 (01/05 0715) Resp:  [11-44] 23 (01/05 0715) BP: (52-112)/(35-96) 85/71 (01/05 0400) SpO2:  [58 %-100 %] 84 % (01/05 0715) Arterial Line BP: (74-132)/(61-105) 83/73 (01/05 0715) Weight:  [106.5 kg] 106.5 kg (01/05 0619) Last BM Date : 10/18/24  Weight change: Filed Weights   10/17/24 0500 10/18/24 0500 10/19/24 0619  Weight: 110.8 kg 110.4 kg 106.5 kg   Intake/Output:  Intake/Output Summary (Last 24 hours) at 10/19/2024 0812 Last data filed at 10/19/2024 0700 Gross per 24 hour  Intake 1872.07 ml  Output 7375 ml  Net -5502.93 ml    Physical Exam    General: well appearing, sitting up in chair. NAD  Neck: + LIJ Swan  Cor: Mechanical heart sounds w/ LVAD hum present. JVD 6 cm, 2+ b/l pretibial edema  Lungs: clear bilaterally  Abdomen: soft, NT, ND Driveline: C/D/I; securement device intact and driveline incorporated Neuro: A&Ox3. Moves all 4 ext w/o difficulty   Telemetry   Afib 110s-120s w/ PVCs, personally reviewed   Labs   CBC Recent Labs    10/18/24 0514 10/18/24 1746 10/19/24 0411 10/19/24 0450  WBC 13.7* 12.7* 11.6*  --   NEUTROABS 11.9*  --  9.9*  --   HGB 8.0* 8.3* 8.5* 8.8*  HCT 23.4* 26.4* 24.4* 26.0*  MCV 91.8 97.8 90.0  --   PLT 187 203 233  --    Basic Metabolic Panel Recent Labs    98/95/73 0514 10/18/24 1617 10/19/24 0411 10/19/24 0450  NA 128* 126* 129* 128*  K 3.2* 3.5 3.0* 2.7*  CL 91* 89* 88*  --   CO2 25 25 28   --   GLUCOSE 100* 162* 172*  --   BUN 35* 39* 36*  --   CREATININE 1.56* 1.59* 1.54*  --   CALCIUM  8.6* 8.8* 8.7*  --   MG 2.2 2.2 2.0  --   PHOS 2.3*  --  2.4*  --    Liver Function Tests Recent Labs  10/17/24 0442  AST 56*  ALT <5  ALKPHOS 90  BILITOT 0.6  PROT 5.7*  ALBUMIN  3.3*    BNP (last 3 results) Recent Labs    06/10/24 1213 07/09/24 1649 07/23/24 0959  BNP 1,071.5* 3,316.0* 595.8*   ProBNP (last 3 results) Recent Labs    10/01/24 1030 10/15/24 0400  PROBNP 8,374.0* 9,508.0*   Medications:    Scheduled Medications:  acetaminophen   1,000 mg Oral Q6H   Or   acetaminophen  (TYLENOL ) oral liquid 160 mg/5 mL  1,000 mg Per Tube Q6H   bisacodyl   10 mg Oral Daily   Or   bisacodyl   10 mg Rectal Daily   Chlorhexidine  Gluconate Cloth  6 each Topical Daily   gabapentin   200 mg Oral QHS   insulin  aspart  0-15 Units Subcutaneous TID WC   insulin  aspart  0-5 Units Subcutaneous QHS   insulin  glargine-yfgn  20 Units Subcutaneous Daily   melatonin  3 mg Oral QHS   metoCLOPramide  (REGLAN ) injection  10 mg Intravenous Q6H   mexiletine  200 mg Oral Q8H    pantoprazole   40 mg Oral Daily   polyethylene glycol  17 g Oral Daily   potassium chloride   40 mEq Oral BID   senna  2 tablet Oral Daily   sildenafil   20 mg Oral TID   sodium chloride  flush  10-40 mL Intracatheter Q12H   sodium chloride  flush  3 mL Intravenous Q12H   spironolactone   25 mg Oral Daily   Warfarin - Pharmacist Dosing Inpatient   Does not apply q1600    Infusions:  amiodarone  30 mg/hr (10/19/24 0700)   epinephrine  3 mcg/min (10/19/24 0700)   furosemide  (LASIX ) 200 mg in dextrose  5 % 100 mL (2 mg/mL) infusion 20 mg/hr (10/19/24 0700)   magnesium  sulfate bolus IVPB     milrinone  0.25 mcg/kg/min (10/19/24 0700)   potassium chloride  50 mL/hr at 10/19/24 0700    PRN Medications: dextrose , fentaNYL  (SUBLIMAZE ) injection, guaiFENesin -dextromethorphan , ondansetron  (ZOFRAN ) IV, mouth rinse, oxyCODONE , phenol, sodium chloride  flush, sodium chloride  flush, traMADol , traZODone   Assessment/Plan   Acute on chronic systolic CHF, NYHA IV: Nonischemic cardiomyopathy, planned LVAD. RHC 12/18 showed elevated filling pressures with mod-severe mixed pulmonary arterial/pulmonary venous hypertension and low CO despite milrinone  0.25, increased to 0.5. Impella 5.5 placed 12/23 for further optimization. - S/p HM III LVAD implant by Dr. Lucas 10/14/24 - iNO off 1/1 - Currently on 0.25 milrinone  + 3 Epi. Co-ox pending. PAPi ok at 2.0  - Continue sildenafil  20mg  TID for RV support - CVP 5 but c/w volume overload, continue lasix  gtt @20 /hr + diamox  500 bid. Repeat Metolazone  5 mg x 1 this afternoon, after K adequately repleated. Repeat BMP in PM  - Continue spironolactone  25mg  daily  Pulmonary hypertension: Severe combined pre/post capillary PH on RHC, suspect largely due to venous remodeling. Improved with adequate unloading. - Good pulsatility, does not need further inotropic support - Continue lasix  gtt as above  AKI on CKD stage 3: Scr continuing to improve with aggressive diuresis. -  Diuresis as above  PVCs/NSVT: Improved. Has Biotronik ICD. - Continue IV amio. - Continue mexiletine 200 TID  - Tachy therapies off for VAD implant. Turn back on prior to d/c   Paroxysmal Atrial fibrillation: In Afib this am, V-rates 110s - Continue amiodarone  gtt as above. Increase to 60/hr  - aggressive K and Mg supp  - Warfarin per TCTS  Meningioma: Monitoring.   Prostate cancer: Treated with radiation.   Thrombocytopenia:  suspect consumptive with impella.  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  Critical care was necessary to treat or prevent imminent or life-threatening deterioration.  Critical care was time spent personally by me on the following activities: development of treatment plan with patient and/or surrogate as well as nursing, discussions with consultants, evaluation of patient's response to treatment, examination of patient, obtaining history from patient or surrogate, ordering and performing treatments and interventions, ordering and review of laboratory studies, ordering and review of radiographic studies, pulse oximetry and re-evaluation of patient's condition.    Please visit [email or web address]: For overnight coverage please call cardiology fellow first. If fellow not available call Shock/ECMO MD on call.  For ECMO / Mechanical Support (Impella, IABP, LVAD) issues call Shock / ECMO MD on call.   Patient seen with PA, I formulated the plan and agree with the above note.   Good diuresis yesterday, weight down 9 lbs.  I/Os net negative 5344 on Lasix  gtt 20 mg/hr + acetazolamide  + metolazone .  CVP 10-11 on my read  today, PA pressure 35/22.   Co-ox low at 44% though CI 2.38 by thermo last night. He remains on milrinone  0.25 + epinephrine  3.   He is in atrial fibrillation today, rate 110s on amiodarone  gtt 60 mg/hr and warfarin with INR 1.6.   General: Well appearing this am. NAD.  HEENT: Normal. Neck: Supple, JVP 10 cm. Carotids OK.  Cardiac:  Mechanical heart sounds with LVAD hum present.  Lungs:  CTAB, normal effort.  Abdomen:  NT, ND, no HSM. No bruits or masses. +BS  LVAD exit site: Well-healed and incorporated. Dressing dry and intact. No erythema or drainage. Stabilization device present and accurately applied. Driveline dressing changed daily per sterile technique. Extremities:  Warm and dry. No cyanosis, clubbing, rash, or edema.  Neuro:  Alert & oriented x 3. Cranial nerves grossly intact. Moves all 4 extremities w/o difficulty. Affect pleasant    Co-ox consistently low though CI by thermo has been acceptable.  He remains volume overloaded on exam with weight up from baseline and CVP 10-11.  BUN/creatinine slowly trending down.  - Repeat co-ox and check thermo CI this morning.  I will place PICC today.  I think I will leave Swan in until tomorrow after further diuresis given low co-ox.  PA pressure is significantly lower than pre-op.  - Continue current milrinone  + epinephrine .  - Continue sildenafil  for RV support.  - I increased LVAD speed to 5400 today with flow up to 5.4.  - Continue aggressive diuresis today with Lasix  20 mg/hr, acetazolamide  and metolazone  5 mg this afternoon after replacing K.  Repeat BMET in pm.   Continue amiodarone  gtt for AF.  On warfarin with INR trending up, 1.6 today.  Will need eventual DCCV if he does not convert back to NSR on his own.   Mobilize.   CRITICAL CARE Performed by: Ezra Shuck  Total critical care time: 45 minutes  Critical care time was exclusive of separately billable procedures and treating other patients.  Critical care was necessary  to treat or prevent imminent or  life-threatening deterioration.  Critical care was time spent personally by me on the following activities: development of treatment plan with patient and/or surrogate as well as nursing, discussions with consultants, evaluation of patient's response to treatment, examination of patient, obtaining history from patient or surrogate, ordering and performing treatments and interventions, ordering and review of laboratory studies, ordering and review of radiographic studies, pulse oximetry and re-evaluation of patient's condition.  Ezra Shuck 10/19/2024 11:00 AM  "

## 2024-10-19 NOTE — Progress Notes (Signed)
 Physical Therapy Treatment Patient Details Name: James [name] MRN: [id number] DOB: 03-16-1961 Today's Date: 10/19/2024   History of Present Illness Pt is a 64 yo male who was admitted 10/01/24 for pre-VAD optimization prior to planned surgery. S/p R heart cath 12/18 & 12/24, impella placement 12/23, impella removal and HM III LVAD placement 12/31. Extubated 10/15/24. PMH: CKD IIIa, hx VT, PAF, prostate cancer, meningioma, COPD, DM2, HTN, hypercholesteremia, OSA, pulmonary hypertension, tobacco abuse.    PT Comments  Pt requires reminders throughout session for LVAD awareness, from visualizing drive line with bed mobility, to sequencing transfer from wall power to batteries and back and for contents of bag. Pt benefits from use of heart pillow to decrease pain with bed mobility and transfers. Pt continues to need modA for bed mobility and modAx2 for transfer to EVA walker and ambulation in hallway. PT continues to believe patient will benefit from intensive inpatient follow-up therapy, >3 hours/day. PT will continue to follow acutely.      If plan is discharge home, recommend the following: Two people to help with walking and/or transfers;Two people to help with bathing/dressing/bathroom;Assistance with cooking/housework;Direct supervision/assist for medications management;Direct supervision/assist for financial management;Assist for transportation;Help with stairs or ramp for entrance;Supervision due to cognitive status   Can travel by private vehicle        Equipment Recommendations  Rolling walker (2 wheels);BSC/3in1;Wheelchair (measurements PT);Wheelchair cushion (measurements PT) (pending progress; may progress to needing only a rollator and 3in1)    Recommendations for Other Services Rehab consult     Precautions / Restrictions Precautions Precautions: Fall;Sternal;Other (comment) Precaution Booklet Issued: No Recall of Precautions/Restrictions: Impaired Precaution/Restrictions  Comments: LVAD; Y-chest tube; A-line; [name] Purl Restrictions Weight Bearing Restrictions Per Provider Order: Yes RUE Weight Bearing Per Provider Order: Non weight bearing (sternal precautions) LUE Weight Bearing Per Provider Order: Non weight bearing (sternal precautions) Other Position/Activity Restrictions: sternal precautions     Mobility  Bed Mobility Overal bed mobility: Needs Assistance Bed Mobility: Rolling, Sidelying to Sit Rolling: Contact guard assist Sidelying to sit: Mod assist, +2 for physical assistance, +2 for safety/equipment, HOB elevated       General bed mobility comments: pt able to come to his side holding heart pillow needs modAx2 for bringing trunk to upright and scooting hip to EOB, pt with R lateral lean in sitting able to correct with cuing    Transfers Overall transfer level: Needs assistance Equipment used: Ambulation equipment used Transfers: Sit to/from Stand, Bed to chair/wheelchair/BSC Sit to Stand: +2 physical assistance, Mod assist (+RN for management of lines)           General transfer comment: modAx2 for power up and steadying in EVA walker, cues for not reaching to walker before entirely upright, requires min A for lower back to recliner after ambulation    Ambulation/Gait Ambulation/Gait assistance: +2 safety/equipment, +2 physical assistance, Mod assist Gait Distance (Feet): 45 Feet Assistive device: Elyn Finder Gait Pattern/deviations: Decreased stride length, Knee flexed in stance - right, Knee flexed in stance - left, Trunk flexed, Step-through pattern, Knees buckling, Drifts right/left, Narrow base of support Gait velocity: reduced Gait velocity interpretation: <1.31 ft/sec, indicative of household ambulator   General Gait Details: Mod assist +2 for balance, eva walker control, and equipment management.pt needs cues for upright posture, and steady stepping, pt with mild unsteadiness through out ambulation.         Balance  Overall balance assessment: Needs assistance Sitting-balance support: No upper extremity supported, Feet supported Sitting balance-Leahy Scale:  Poor Sitting balance - Comments: R lateral lean, CGA-minA Postural control: Right lateral lean, Posterior lean Standing balance support: Bilateral upper extremity supported, During functional activity Standing balance-Leahy Scale: Poor Standing balance comment: reliant on min -mod A                            Communication Communication Communication: No apparent difficulties  Cognition Arousal: Alert Behavior During Therapy: WFL for tasks assessed/performed   PT - Cognitive impairments: Memory, Attention, Problem solving, Sequencing                       PT - Cognition Comments: needs reminders about LVAD transfer to and from batteries Following commands: Impaired Following commands impaired: Follows one step commands with increased time, Follows multi-step commands with increased time    Cueing Cueing Techniques: Verbal cues, Tactile cues, Gestural cues     General Comments General comments (skin integrity, edema, etc.): Reviewed LVAD kit items and when he needs to take his kit with him. Prior to sitting up reviewed need to visualize drive line so it does not get pulled with transfers, Therapy assisted with set up for change to batteries and back to wall power. Pt needs cuing for sequencing. Unable to record LVAD parameters as screen would not come up on ipad, RN to work on      Pertinent Vitals/Pain Pain Assessment Pain Assessment: Faces Faces Pain Scale: Hurts a little bit Pain Location: chest Pain Descriptors / Indicators: Discomfort Pain Intervention(s): Limited activity within patient's tolerance, Monitored during session, Repositioned    Home Living Family/patient expects to be discharged to:: Private residence                            PT Goals (current goals can now be found in the care plan  section) Acute Rehab PT Goals Patient Stated Goal: to improve PT Goal Formulation: With patient/family Time For Goal Achievement: 10/30/24 Potential to Achieve Goals: Good Progress towards PT goals: Progressing toward goals    Frequency    Min 3X/week      PT Plan      Co-evaluation PT/OT/SLP Co-Evaluation/Treatment: Yes Reason for Co-Treatment: Complexity of the patient's impairments (multi-system involvement);For patient/therapist safety;To address functional/ADL transfers PT goals addressed during session: Mobility/safety with mobility;Balance        AM-PAC PT 6 Clicks Mobility   Outcome Measure  Help needed turning from your back to your side while in a flat bed without using bedrails?: A Little Help needed moving from lying on your back to sitting on the side of a flat bed without using bedrails?: A Lot Help needed moving to and from a bed to a chair (including a wheelchair)?: Total Help needed standing up from a chair using your arms (e.g., wheelchair or bedside chair)?: Total Help needed to walk in hospital room?: Total Help needed climbing 3-5 steps with a railing? : Total 6 Click Score: 9    End of Session   Activity Tolerance: Patient tolerated treatment well Patient left: in chair;with family/visitor present;with call bell/phone within reach;with nursing/sitter in room Nurse Communication: Mobility status PT Visit Diagnosis: Unsteadiness on feet (R26.81);Other abnormalities of gait and mobility (R26.89);Muscle weakness (generalized) (M62.81);Difficulty in walking, not elsewhere classified (R26.2);Pain Pain - part of body:  (Sternum)     Time: 8955-8874 PT Time Calculation (min) (ACUTE ONLY): 41 min  Charges:    $  Gait Training: 8-22 mins PT General Charges $$ ACUTE PT VISIT: 1 Visit                     Kennon Encinas B. Fleeta Lapidus PT, DPT Acute Rehabilitation Services Please use secure chat or  Call Office [phone]    Almarie KATHEE Fleeta  [facility] 10/19/2024, 1:57 PM

## 2024-10-19 NOTE — Progress Notes (Signed)
 "  NAME:  James [name], MRN:  [id number], DOB:  12/21/1960, LOS: 18 ADMISSION DATE:  10/01/2024, CONSULTATION DATE:  10/14/24 REFERRING MD:  Lucas , CHIEF COMPLAINT:  s/p LVAD   History of Present Illness:  64 yo M with stg D cardiomyopathy + inotrope dependence, CKD 3a, hx VT, pAFib, meningioma who was admitted to HF service 10/01/24 for pre-VAD optimization  This included: RHC, milrinone  titration, impella placement, levo and DBA titration and volume optimization  On 10/14/24 he went for LVAD  Pump: 116 min  EBL: 750 Product: 2 plt 4 ffp 708 cellsaver  PCCM is consulted post op ICU management.  Pertinent  Medical History  HFrEF Inotrope dependence VT pAFib  Prostate cancer Meningioma  HTN DM2   Significant Hospital Events: Including procedures, antibiotic start and stop dates in addition to other pertinent events    10/01/24 RHC w low output and pHTN, and subsequent uptitration of milrinone  from 0.25 to 0.5 10/02/24 started ND 10/05/24 high PA pressures, hignPCWP high SVR. 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 Hep to bival 10/13/24 not likely HIT. Milrinone  to 0.375  10/14/24 LVAD. PCCM consult.  10/26/23 extubated, nitric off, 1u prbc, lasix   Interim History / Subjective:  Patient is alert and well no acute issues. He continues to be on amiodarone , epinephrine , Lasix  drip along with milrinone . ScVo2 is low. mPAP is 37. CVP 19.  Plans to continue diuresis noted.  Tachycardic today.  Plans to remove pacing wires today and likely Swan tomorrow  Plans for PICC line noted.  Plans for CIR noted.  Objective    Blood pressure (!) 82/53, pulse (!) 196, temperature 98.8 F (37.1 C), resp. rate 15, height 6' 6 (1.981 m), weight 106.5 kg, SpO2 92%. PAP: (4-93)/(0-74) 41/22 CVP:  [3 mmHg-19 mmHg] 13 mmHg CO:  [5.2 L/min-6.1 L/min] 5.2  L/min CI:  [2.2 L/min/m2-2.58 L/min/m2] 2.2 L/min/m2      Intake/Output Summary (Last 24 hours) at 10/19/2024 1444 Last data filed at 10/19/2024 1400 Gross per 24 hour  Intake 1699.19 ml  Output 7875 ml  Net -6175.81 ml   Filed Weights   10/17/24 0500 10/18/24 0500 10/19/24 0619  Weight: 110.8 kg 110.4 kg 106.5 kg    Examination: Physical exam: General: Alert and oriented no distress.  Lying on the bed HEENT: [place]/AT, eyes anicteric.  moist mucus membranes Neuro: Alert, awake following commands Chest: Coarse breath sounds, no wheezes or rhonchi Heart: Tachycardic irregular rhythm., no murmurs or gallops.  LVAD hum. Abdomen: Soft, nontender, nondistended, bowel sounds present  LVAD  Speed 5300 Flow 5.2 L,PMpower 3.6, PI 3.0 Milrinone  0.25 Epi 3 Amio 30   CVP PA CI  coox 52.1  Resolved problem list   Assessment and Plan   Acute on chronic HFrEF, stage D cardiomyopathy s/p LVAD (HM3 10/14/24)  pHTN, acute on chronic RV failure   PVCs, NSVT  pAFib  -post-op care per TCTS - Pleural drain in place.  Mediastinal chest tubes removed yesterday.  And blake drain from impella site  - coumadin  this evening per pharmacy  - Continue milrinone , epinephrine  and Amio drips as above. -Lasix  drip is continued at 20 mg/h. - con't mexiletine 200mg  q8h  - complete perioperative ancef   - mobility as able   Acute hypoxic respiratory failure 2/2 pulmonary edema, deconditioning  - O2 wean for sat >92%  - mobility -> oob if tolerates  -  incentive spirometry   LUE swelling improving - Monitor  Leukocytosis resolved May be all post-operative, critical illness reactivity; no true fevers  - trend fever, wbc curves  - still on perioperative antibiotics   CKD 3a Hyponatremia  Nephro labs stable.  Hyponatremia ongoing 130>128, may be now oral water  intake contributing in addition to his HF pathophys  - monitor  - drink solutes in addition to water  I.e. gatorade etc.  - renal perfusion,  renally dose meds, elyte replacement, strict I/O  DM2 w hyperglycemia  - ssi resistant scale  -  lantus  20 units daily  - cbg q4h   Anemia; expected operative blood loss.  Thrombocytopenia; not HIT - trend  - transfuse prn   Labs   CBC: Recent Labs  Lab 10/15/24 0400 10/15/24 0557 10/16/24 0414 10/16/24 0451 10/17/24 0442 10/18/24 0514 10/18/24 1746 10/19/24 0411 10/19/24 0450  WBC 13.1*   < > 21.5*  --  18.0* 13.7* 12.7* 11.6*  --   NEUTROABS 11.6*  --  19.3*  --  15.9* 11.9*  --  9.9*  --   HGB 7.8*   < > 8.6*   < > 8.0* 8.0* 8.3* 8.5* 8.8*  HCT 22.7*   < > 25.4*   < > 23.6* 23.4* 26.4* 24.4* 26.0*  MCV 92.3   < > 92.4  --  91.8 91.8 97.8 90.0  --   PLT 94*   < > 124*  --  143* 187 203 233  --    < > = values in this interval not displayed.    Basic Metabolic Panel: Recent Labs  Lab 10/15/24 0400 10/15/24 0557 10/16/24 0414 10/16/24 0451 10/17/24 0442 10/17/24 2030 10/18/24 0514 10/18/24 1617 10/19/24 0411 10/19/24 0450  NA 133*   < > 128*   < > 127* 125* 128* 126* 129* 128*  K 4.0   < > 4.0   < > 3.7 3.6 3.2* 3.5 3.0* 2.7*  CL 99   < > 94*   < > 92* 90* 91* 89* 88*  --   CO2 22   < > 22   < > 23 24 25 25 28   --   GLUCOSE 119*   < > 154*   < > 129* 153* 100* 162* 172*  --   BUN 24*   < > 24*   < > 35* 38* 35* 39* 36*  --   CREATININE 1.55*   < > 1.62*   < > 1.81* 1.72* 1.56* 1.59* 1.54*  --   CALCIUM  9.2   < > 9.0   < > 8.7* 8.6* 8.6* 8.8* 8.7*  --   MG 2.3   < > 2.0  --  1.8 1.9 2.2 2.2 2.0  --   PHOS 3.3  --  4.4  --  3.3  --  2.3*  --  2.4*  --    < > = values in this interval not displayed.   GFR: Estimated Creatinine Clearance: 63.5 mL/min (A) (by C-G formula based on SCr of 1.54 mg/dL (H)). Recent Labs  Lab 10/16/24 1706 10/17/24 0442 10/18/24 0514 10/18/24 1746 10/19/24 0411  WBC  --  18.0* 13.7* 12.7* 11.6*  LATICACIDVEN 1.6  --   --   --   --     Liver Function Tests: Recent Labs  Lab 10/15/24 0400 10/16/24 0414 10/17/24 0442   AST 82* 102* 56*  ALT 16 15 <5  ALKPHOS 90 87 90  BILITOT 1.3* 1.2  0.6  PROT 5.3* 5.9* 5.7*  ALBUMIN  3.4* 3.6 3.3*     ABG    Component Value Date/Time   PHART 7.542 (H) 10/19/2024 0450   PCO2ART 33.5 10/19/2024 0450   PO2ART 82 (L) 10/19/2024 0450   HCO3 28.7 (H) 10/19/2024 0450   TCO2 30 10/19/2024 0450   ACIDBASEDEF 1.0 10/17/2024 0438   O2SAT 52.1 10/19/2024 1424     Critical care time: 53 MINUTES     Tamela Stakes, MD  Attending Physician, Critical Care Medicine [facility] See Amion for pager If no response to pager, please call [phone] until 7pm After 7pm, Please call E-link [phone]     "

## 2024-10-19 NOTE — Progress Notes (Signed)
 PHARMACY - ANTICOAGULATION CONSULT NOTE  Pharmacy Consult for warfarin Indication: LVAD HM3 + hx AFib  Allergies[1]  Patient Measurements: Height: 6' 6 (198.1 cm) Weight: 106.5 kg (234 lb 12.6 oz) IBW/kg (Calculated) : 91.4 HEPARIN  DW (KG): 106.5  Vital Signs: Temp: 99.1 F (37.3 C) (01/05 0715) BP: 85/71 (01/05 0400) Pulse Rate: 226 (01/05 0715)  Labs: Recent Labs    10/17/24 0442 10/17/24 2030 10/18/24 0514 10/18/24 1617 10/18/24 1746 10/19/24 0411 10/19/24 0450  HGB 8.0*  --  8.0*  --  8.3* 8.5* 8.8*  HCT 23.6*  --  23.4*  --  26.4* 24.4* 26.0*  PLT 143*  --  187  --  203 233  --   LABPROT 17.4*  --  18.2*  --   --  19.6*  --   INR 1.4*  --  1.4*  --   --  1.6*  --   CREATININE 1.81*   < > 1.56* 1.59*  --  1.54*  --    < > = values in this interval not displayed.    Estimated Creatinine Clearance: 63.5 mL/min (A) (by C-G formula based on SCr of 1.54 mg/dL (H)).   Medical History: Past Medical History:  Diagnosis Date   CKD (chronic kidney disease) stage 2, GFR 60-89 ml/min    COPD (chronic obstructive pulmonary disease) (HCC)    Diabetes mellitus type II, non insulin  dependent (HCC)    Dilated aortic root    Elevated PSA    Hypercholesteremia    Hypertension    NICM (nonischemic cardiomyopathy) (HCC)    NSVT (nonsustained ventricular tachycardia) (HCC)    Obstructive sleep apnea    Paroxysmal atrial fibrillation (HCC)    Pulmonary hypertension (HCC)    PVC's (premature ventricular contractions)    Tobacco abuse     Medications:  Scheduled:   acetaminophen   1,000 mg Oral Q6H   Or   acetaminophen  (TYLENOL ) oral liquid 160 mg/5 mL  1,000 mg Per Tube Q6H   acetaZOLAMIDE   500 mg Oral Q6H   bisacodyl   10 mg Oral Daily   Or   bisacodyl   10 mg Rectal Daily   Chlorhexidine  Gluconate Cloth  6 each Topical Daily   gabapentin   200 mg Oral QHS   insulin  aspart  0-15 Units Subcutaneous TID WC   insulin  aspart  0-5 Units Subcutaneous QHS   insulin   glargine-yfgn  20 Units Subcutaneous Daily   melatonin  3 mg Oral QHS   metoCLOPramide  (REGLAN ) injection  10 mg Intravenous Q6H   mexiletine  200 mg Oral Q8H   pantoprazole   40 mg Oral Daily   polyethylene glycol  17 g Oral Daily   potassium chloride   40 mEq Oral BID   senna  2 tablet Oral Daily   sildenafil   20 mg Oral TID   sodium chloride  flush  10-40 mL Intracatheter Q12H   sodium chloride  flush  3 mL Intravenous Q12H   spironolactone   25 mg Oral Daily   Warfarin - Pharmacist Dosing Inpatient   Does not apply q1600    Assessment: 63 yom who underwent LVAD HM3 placement on 12/31. Was on apixaban  PTA for hx Afib > now with LVAD plan to change to warfarin.   10/19/24: INR 1.6 (subtherapeutic) after 3 doses of warfarin.  Hgb 8.5 (improving), pltc 233. LDH 598. No s/sx of bleeding noted. Appetite and oral intake improving, eating 50-75% of meals per patient.   Another day of aggressive diuresis (s/p 7.4L UOP) - anticipate  will become more sensitive to warfarin with volume removal/use of metolazone .  Also increasing amiodarone  to 60 mg/h with RVR.   Goal of Therapy:  INR 2-2.5 Monitor platelets by anticoagulation protocol: Yes   Plan:  Warfarin 2 mg x1 PO   Monitor daily INR, CBC, and for s/sx of bleeding   Thank you for allowing pharmacy to participate in this patient's care,  Maurilio Fila, PharmD Clinical Pharmacist 10/19/2024  8:17 AM  Please check AMION for all [facility] phone numbers After 10:00 PM, call Main Pharmacy [phone]     [1] No Known Allergies

## 2024-10-19 NOTE — Progress Notes (Signed)
 Occupational Therapy Treatment Patient Details Name: James [name] MRN: [id number] DOB: 1961/08/15 Today's Date: 10/19/2024   History of present illness Pt is a 64 yo male who was admitted 10/01/24 for pre-VAD optimization prior to planned surgery. S/p R heart cath 12/18 & 12/24, impella placement 12/23, impella removal and HM III LVAD placement 12/31. Extubated 10/15/24. PMH: CKD IIIa, hx VT, PAF, prostate cancer, meningioma, COPD, DM2, HTN, hypercholesteremia, OSA, pulmonary hypertension, tobacco abuse.   OT comments  Garrel is making steady progress toward goals. Education on management of LVAD equipment during ADL and functional mobility, with special attention to driveline awareness and having control box secured before movement. Pt with B hand weakness, having difficulty manipulating connectors - will provide grip/pinch strengthening activities. Able to ambulate >180 ft with min A +2 A for line management using Eva walker on RA with VSS. Moderate cues for upright posture during mobility. Wife present during end of session and is very supportive. Pt motivated to participate in rehab. Acute OT to follow. Continue to recommend intensive inpatient follow-up therapy, >3 hours/day to maximize functional level of independence with goal to DC home.       If plan is discharge home, recommend the following:  A lot of help with walking and/or transfers;A lot of help with bathing/dressing/bathroom   Equipment Recommendations  BSC/3in1    Recommendations for Other Services Rehab consult    Precautions / Restrictions Precautions Precautions: Fall;Sternal;Other (comment) Precaution Booklet Issued: No Recall of Precautions/Restrictions: Impaired Precaution/Restrictions Comments: LVAD; Y-chest tube; A-line; [name] Purl Restrictions Weight Bearing Restrictions Per Provider Order: Yes RUE Weight Bearing Per Provider Order:  (sternal precautions) LUE Weight Bearing Per Provider Order:  (sternal  precautions) Other Position/Activity Restrictions: sternal precautions       Mobility Bed Mobility Overal bed mobility: Needs Assistance Bed Mobility: Rolling, Sidelying to Sit Rolling: Contact guard assist Sidelying to sit: Mod assist, +2 for physical assistance, +2 for safety/equipment, HOB elevated       General bed mobility comments: pt able to come to his side holding heart pillow needs modAx2 for bringing trunk to upright and scooting hip to EOB, pt with R lateral lean in sitting able to correct with cuing    Transfers Overall transfer level: Needs assistance Equipment used: Ambulation equipment used Transfers: Sit to/from Stand, Bed to chair/wheelchair/BSC Sit to Stand: +2 physical assistance, Mod assist (+RN for management of lines)           General transfer comment: modAx2 for power up and steadying in EVA walker, cues for not reaching to walker before entirely upright, requires min A for lower back to recliner after ambulation     Balance Overall balance assessment: Needs assistance Sitting-balance support: No upper extremity supported, Feet supported Sitting balance-Leahy Scale: Poor Sitting balance - Comments: R lateral lean, CGA-minA Postural control: Right lateral lean, Posterior lean Standing balance support: Bilateral upper extremity supported, During functional activity Standing balance-Leahy Scale: Poor Standing balance comment: reliant on min -mod A                           ADL either performed or assessed with clinical judgement   ADL Overall ADL's : Needs assistance/impaired Eating/Feeding: Set up   Grooming: Minimal assistance  Max A to donn vest - Able to place batteries in clips with VC for instruction of correct orientation of batteries  Functional mobility during ADLs: Moderate assistance;+2 for physical assistance (to stand)      Extremity/Trunk Assessment Upper Extremity  Assessment Upper Extremity Assessment: Left hand dominant;Generalized weakness       Cervical / Trunk Assessment Cervical / Trunk Assessment: Other exceptions (sternal precautions; LVAD/drive line)    Vision       Perception     Praxis     Communication Communication Communication: No apparent difficulties   Cognition Arousal: Alert Behavior During Therapy: WFL for tasks assessed/performed Cognition: Cognition impaired     Awareness: Online awareness impaired Memory impairment (select all impairments): Working civil service fast streamer, Programmer, systems, Engineer, structural memory Attention impairment (select first level of impairment): Selective attention Executive functioning impairment (select all impairments): Problem solving OT - Cognition Comments: slower processing; will continue to assess                 Following commands: Impaired Following commands impaired: Follows one step commands with increased time, Follows multi-step commands with increased time      Cueing   Cueing Techniques: Verbal cues, Tactile cues, Gestural cues  Exercises Other Exercises Other Exercises: incentive spriometer x 5 - able to pull over 500 ml    Shoulder Instructions       General Comments Reviewed LVAD kit items and when he needs to take his kit with him. Prior to sitting up reviewed need to visualize drive line so it does not get pulled with transfers, Therapy assisted with set up for change to batteries and back to wall power. Pt needs cuing for sequencing. Unable to record LVAD parameters as screen would not come up on ipad, RN to work on    Pertinent Vitals/ Pain       Pain Assessment Pain Assessment: Faces Faces Pain Scale: Hurts a little bit Pain Location: chest Pain Descriptors / Indicators: Discomfort Pain Intervention(s): Limited activity within patient's tolerance  Home Living Family/patient expects to be discharged to:: Private residence                                         Prior Functioning/Environment              Frequency  Min 2X/week        Progress Toward Goals  OT Goals(current goals can now be found in the care plan section)  Progress towards OT goals: Progressing toward goals  Acute Rehab OT Goals Patient Stated Goal: go to rehab OT Goal Formulation: With patient/family Time For Goal Achievement: 10/30/24 Potential to Achieve Goals: Good ADL Goals Pt Will Perform Upper Body Bathing: sitting Pt Will Perform Lower Body Bathing: with min assist;sit to/from stand Pt Will Perform Upper Body Dressing: with min assist;sitting Pt Will Transfer to Toilet: with min assist;ambulating Pt Will Perform Toileting - Clothing Manipulation and hygiene: with min assist;sit to/from stand  Plan      Co-evaluation    PT/OT/SLP Co-Evaluation/Treatment: Yes Reason for Co-Treatment: Complexity of the patient's impairments (multi-system involvement);For patient/therapist safety;To address functional/ADL transfers PT goals addressed during session: Mobility/safety with mobility;Balance OT goals addressed during session: ADL's and self-care;Strengthening/ROM      AM-PAC OT 6 Clicks Daily Activity     Outcome Measure   Help from another person eating meals?: A Little Help from another person taking care of personal grooming?: A Lot Help from another person toileting, which includes using toliet, bedpan, or urinal?: A Lot  Help from another person bathing (including washing, rinsing, drying)?: A Lot Help from another person to put on and taking off regular upper body clothing?: A Lot Help from another person to put on and taking off regular lower body clothing?: A Lot 6 Click Score: 13    End of Session    OT Visit Diagnosis: Unsteadiness on feet (R26.81);Other abnormalities of gait and mobility (R26.89);Muscle weakness (generalized) (M62.81)   Activity Tolerance Patient tolerated treatment well   Patient Left in  chair;with call bell/phone within reach;with chair alarm set;with family/visitor present;with nursing/sitter in room   Nurse Communication Mobility status        Time: 8954-8872 OT Time Calculation (min): 42 min  Charges: OT General Charges $OT Visit: 1 Visit OT Treatments $Self Care/Home Management : 8-22 mins $Therapeutic Activity: 8-22 mins  Kreg Sink, OT/L   Acute OT Clinical Specialist Acute Rehabilitation Services Pager [phone] Office [phone]   [facility] 10/19/2024, 3:38 PM

## 2024-10-19 NOTE — TOC Progression Note (Signed)
 Transition of Care [facility]) - Progression Note    Patient Details  Name: James [name] MRN: [id number] Date of Birth: [birth year]  [facility] CM/SW Contact  [name] Phone Number: [phone] 10/19/2024, 12:23 PM  Clinical Narrative:   HF CSW reviewed patients chart for discharge readiness, patient not medically stable for d/c. ICM will continue to follow.   HF CSW/CM will continue to follow and monitor for dc readiness.     Expected Discharge Plan: IP Rehab Facility Barriers to Discharge: Continued Medical Work up               Expected Discharge Plan and Services   Discharge Planning Services: CM Consult Post Acute Care Choice: Home Health Living arrangements for the past 2 months: Single Family Home                                       Social Drivers of Health (SDOH) Interventions SDOH Screenings   Food Insecurity: No Food Insecurity (10/06/2024)  Housing: Low Risk (10/06/2024)  Transportation Needs: No Transportation Needs (10/06/2024)  Utilities: Not At Risk (10/06/2024)  Alcohol Screen: Low Risk (11/18/2023)  Depression (PHQ2-9): Low Risk (08/21/2024)  Tobacco Use: Medium Risk (10/14/2024)    Readmission Risk Interventions    07/10/2024   11:05 AM 10/10/2023   10:37 AM  Readmission Risk Prevention Plan  Transportation Screening Complete Complete  Home Care Screening Complete Complete  Medication Review (RN CM) Complete Complete

## 2024-10-19 NOTE — Progress Notes (Signed)
" °

## 2024-10-19 NOTE — Progress Notes (Signed)
 LVAD Coordinator Rounding Note:  Admitted 10/01/24 for optimization prior to LVAD implant. Impella 5.5 placed 10/06/24.  HM 3 LVAD implanted on 10/14/24 by Dr Lucas under destination therapy criteria.  12/18: Milrinone  titrated from 0.25 to 0.5 mcg/kg/min post RHC d/t low-output and pulmonary hypertension 12/19: NE added 12/22: Markedly elevated PA pressures, elevated PCWP and elevated SVR. Titrated off NE. Added DBA.  12/23: Impella 5.5 Implanted.  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 Heparin  > Bival.  12/31: HM III LVAD implant  Pt sitting in chair on arrival. Assisted bedside nurse from chair to bed. Pt deconditioned. PT/OT following. CIR following. PICC placed today.  Vital signs: Temp: 99.1 HR: 111 Doppler Pressure: none documented Arterial BP: 89/79 (83) Automatic BP: 82/53 (63) O2 Sat: 95% Wt: 235.4>244.3>243.4>234.8 lb    LVAD interrogation reveals:  Speed: 5400 Flow: 4.8 Power: 3.7 PI: 3.5   Alarms: none Events:  none Hematocrit: 25  Fixed speed: 5300 Low speed limit: 5000   Drive Line: Existing VAD dressing removed and site care performed using sterile technique. Drive line exit site cleaned with Chlora prep applicators x 2, allowed to dry, and gauze dressing with Silverlon patch applied. Exit site unincorporated, the velour is fully implanted at exit site. Moderate amount of serosanguinous drainage present on previous dressing. No redness, tenderness, foul odor or rash noted. Cath grip anchor re-applied. Continue daily dressing changes by VAD coordinator or nurse champion. Next dressing change due 10/20/24.       Labs:  LDH trend: [phone]  INR trend: 1.3>1.4>1.4>1.6  Anticoagulation Plan: - INR Goal: 2.0 - 2.5 - Coumadin  dosing per pharmacy  Blood Products:  Intra-op 10/14/24:   4 FFP  2 platelets  700 cellsaver  DDAVP  x 2 Post-op:  10/15/24: 1 PRBC  Device: - Biotronik -Therapies: OFF  Arrythmias: PVCs/NSVT- currently on Amiodarone  drip. On  Mexiletine 200 mg TID.  Respiratory: extubated 10/15/24  Infection:   Renal:  - BUN/CRT: 36/1.54  Drips:  Epinephrine  3 mcg/min Milrinone  0.25 mcg/kg/min Amiodarone  60 mg/hr  Adverse Events on VAD:  Patient Education:  Pt perform self test today independently Equipment and back up bad reviewed today during physical therapy  Plan/Recommendations:  1. Page VAD coordinator for any equipment or drive line concerns 2. Daily drive line dressing changes by VAD coordinator or nurse champion  Schuyler Lunger RN, BSN VAD Coordinator 24/7 Pager [phone]

## 2024-10-19 NOTE — Progress Notes (Signed)
 Peripherally Inserted Central Catheter Placement  The IV Nurse has discussed with the patient and/or persons authorized to consent for the patient, the purpose of this procedure and the potential benefits and risks involved with this procedure.  The benefits include less needle sticks, lab draws from the catheter, and the patient may be discharged home with the catheter. Risks include, but not limited to, infection, bleeding, blood clot (thrombus formation), and puncture of an artery; nerve damage and irregular heartbeat and possibility to perform a PICC exchange if needed/ordered by physician.  Alternatives to this procedure were also discussed.  Bard Power PICC patient education guide, fact sheet on infection prevention and patient information card has been provided to patient /or left at bedside.  Consent signed by Wife at bedside due to patient being very sleepy and unable to remain awake for the explanation and ask questions.  PICC Placement Documentation  PICC Triple Lumen 10/19/24 Right Brachial 42 cm 0 cm (Active)  Indication for Insertion or Continuance of Line Vasoactive infusions 10/19/24 1324  Exposed Catheter (cm) 0 cm 10/19/24 1324  Site Assessment Clean, Dry, Intact 10/19/24 1324  Lumen #1 Status Flushed;Saline locked;Blood return noted 10/19/24 1324  Lumen #2 Status Flushed;Saline locked;Blood return noted 10/19/24 1324  Lumen #3 Status Flushed;Saline locked;Blood return noted 10/19/24 1324  Dressing Type Transparent;Securing device 10/19/24 1324  Dressing Status Antimicrobial disc/dressing in place;Clean, Dry, Intact 10/19/24 1324  Line Care Connections checked and tightened 10/19/24 1324  Line Adjustment (NICU/IV Team Only) No 10/19/24 1324  Dressing Intervention New dressing;Adhesive placed at insertion site (IV team only) 10/19/24 1324  Dressing Change Due 10/26/24 10/19/24 1324       Reveca Desmarais Loraine 10/19/2024, 1:26 PM

## 2024-10-19 NOTE — Progress Notes (Addendum)
 Inpatient Rehab Admissions Coordinator:    CIR following. I met with Pt. To discuss potential CIR admit. He is interested, wife and daughter can rotate to provide 24/7 support at dc.. Currently, Pt. Is  not medically stable to d/c. It is possible Pt. Will continue to progress and d/c directly home once medically stable but  I will follow for progress and medical readiness and pursue for admit if he has continued rehab needs when approaching medical stability.   Leita Kleine, MS, CCC-SLP Rehab Admissions Coordinator  [phone] (celll) [phone] (office)

## 2024-10-19 NOTE — Progress Notes (Signed)
 HeartMate 3 Rounding Note  Subjective:    Uneventful night. Continues to diurese 500cc/hr. -5300 cc yesterday. Wt down 8-9 lbs. Still 16 lbs over preop. Says he feels ok. Breathing is better.  Milrinone  0.25, epi 3  Co-ox pending this am. CVP 8. PA 39/26.   LVAD INTERROGATION:  HeartMate IIl LVAD:  Flow 5.0 liters/min, speed 5300, power 4, PI 3.3.    Objective:    Vital Signs:   Temp:  [94.8 F (34.9 C)-99.3 F (37.4 C)] 99.1 F (37.3 C) (01/05 0645) Pulse Rate:  [102-205] 158 (01/05 0645) Resp:  [11-44] 23 (01/05 0645) BP: (52-112)/(35-96) 85/71 (01/05 0400) SpO2:  [58 %-100 %] 58 % (01/05 0645) Arterial Line BP: (74-132)/(61-105) 74/63 (01/05 0645) Weight:  [106.5 kg] 106.5 kg (01/05 0619) Last BM Date : 10/18/24 Mean arterial Pressure 80  Intake/Output:   Intake/Output Summary (Last 24 hours) at 10/19/2024 0703 Last data filed at 10/19/2024 0700 Gross per 24 hour  Intake 2150.62 ml  Output 7495 ml  Net -5344.38 ml     Physical Exam: General:  Well appearing. No resp difficulty, sitting up in chair Cor: Distant heart sounds with LVAD hum present. Lungs: few crackles bilaterally Abdomen: soft, nontender, nondistended. Good bowel sounds. Extremities: mild edema Neuro: alert & orientedx3, cranial nerves grossly intact. moves all 4 extremities w/o difficulty. Affect pleasant  Telemetry: atrial fib 110  Labs: Basic Metabolic Panel: Recent Labs  Lab 10/15/24 0400 10/15/24 0557 10/16/24 0414 10/16/24 0451 10/17/24 0442 10/17/24 2030 10/18/24 0514 10/18/24 1617 10/19/24 0411 10/19/24 0450  NA 133*   < > 128*   < > 127* 125* 128* 126* 129* 128*  K 4.0   < > 4.0   < > 3.7 3.6 3.2* 3.5 3.0* 2.7*  CL 99   < > 94*   < > 92* 90* 91* 89* 88*  --   CO2 22   < > 22   < > 23 24 25 25 28   --   GLUCOSE 119*   < > 154*   < > 129* 153* 100* 162* 172*  --   BUN 24*   < > 24*   < > 35* 38* 35* 39* 36*  --   CREATININE 1.55*   < > 1.62*   < > 1.81* 1.72* 1.56* 1.59* 1.54*   --   CALCIUM  9.2   < > 9.0   < > 8.7* 8.6* 8.6* 8.8* 8.7*  --   MG 2.3   < > 2.0  --  1.8 1.9 2.2 2.2 2.0  --   PHOS 3.3  --  4.4  --  3.3  --  2.3*  --  2.4*  --    < > = values in this interval not displayed.    Liver Function Tests: Recent Labs  Lab 10/15/24 0400 10/16/24 0414 10/17/24 0442  AST 82* 102* 56*  ALT 16 15 <5  ALKPHOS 90 87 90  BILITOT 1.3* 1.2 0.6  PROT 5.3* 5.9* 5.7*  ALBUMIN  3.4* 3.6 3.3*   No results for input(s): LIPASE, AMYLASE in the last 168 hours. No results for input(s): AMMONIA in the last 168 hours.  CBC: Recent Labs  Lab 10/15/24 0400 10/15/24 0557 10/16/24 0414 10/16/24 0451 10/17/24 0442 10/18/24 0514 10/18/24 1746 10/19/24 0411 10/19/24 0450  WBC 13.1*   < > 21.5*  --  18.0* 13.7* 12.7* 11.6*  --   NEUTROABS 11.6*  --  19.3*  --  15.9* 11.9*  --  9.9*  --   HGB 7.8*   < > 8.6*   < > 8.0* 8.0* 8.3* 8.5* 8.8*  HCT 22.7*   < > 25.4*   < > 23.6* 23.4* 26.4* 24.4* 26.0*  MCV 92.3   < > 92.4  --  91.8 91.8 97.8 90.0  --   PLT 94*   < > 124*  --  143* 187 203 233  --    < > = values in this interval not displayed.    INR: Recent Labs  Lab 10/15/24 0400 10/16/24 0414 10/17/24 0442 10/18/24 0514 10/19/24 0411  INR 1.2 1.3* 1.4* 1.4* 1.6*    Other results: EKG:   Imaging: DG Chest Port 1 View Result Date: 10/18/2024 EXAM: 1 VIEW(S) XRAY OF THE CHEST 10/18/2024 05:33:00 AM COMPARISON: 10/17/2024 CLINICAL HISTORY: LVAD (left ventricular assist device) present (HCC) FINDINGS: LINES, TUBES AND DEVICES: Left chest tube stable in place. Left IJ Swan-Ganz catheter tip overlies right main pulmonary artery. LVAD in place. Left AICD in place. LUNGS AND PLEURA: Small bilateral pleural effusions. Mild pulmonary edema. Bilateral interstitial opacities. Streaky bibasilar opacities. No pneumothorax. HEART AND MEDIASTINUM: Stable cardiomegaly. No acute abnormality of the mediastinal silhouette. BONES AND SOFT TISSUES: Prior median sternotomy  noted. No acute osseous abnormality. IMPRESSION: 1. Unchanged appearance of small bilateral pleural effusions with mild pulmonary edema and bilateral interstitial opacities. 2. LVAD and left AICD are in place. 3. Left chest tube and left IJ Swan-Ganz catheter are in place. Electronically signed by: Taylor Stroud MD 10/18/2024 08:23 AM EST RP Workstation: [id number]     Medications:     Scheduled Medications:  acetaminophen   1,000 mg Oral Q6H   Or   acetaminophen  (TYLENOL ) oral liquid 160 mg/5 mL  1,000 mg Per Tube Q6H   bisacodyl   10 mg Oral Daily   Or   bisacodyl   10 mg Rectal Daily   Chlorhexidine  Gluconate Cloth  6 each Topical Daily   docusate sodium   100 mg Oral Daily   gabapentin   200 mg Oral QHS   insulin  aspart  0-15 Units Subcutaneous TID WC   insulin  aspart  0-5 Units Subcutaneous QHS   insulin  glargine-yfgn  20 Units Subcutaneous Daily   melatonin  3 mg Oral QHS   metoCLOPramide  (REGLAN ) injection  10 mg Intravenous Q6H   mexiletine  200 mg Oral Q8H   pantoprazole   40 mg Oral Daily   polyethylene glycol  17 g Oral Daily   potassium chloride   40 mEq Oral BID   senna  2 tablet Oral Daily   sildenafil   20 mg Oral TID   sodium chloride  flush  10-40 mL Intracatheter Q12H   sodium chloride  flush  3 mL Intravenous Q12H   spironolactone   25 mg Oral Daily   Warfarin - Pharmacist Dosing Inpatient   Does not apply q1600    Infusions:  amiodarone  30 mg/hr (10/19/24 0700)   epinephrine  3 mcg/min (10/19/24 0700)   furosemide  (LASIX ) 200 mg in dextrose  5 % 100 mL (2 mg/mL) infusion 20 mg/hr (10/19/24 0700)   milrinone  0.25 mcg/kg/min (10/19/24 0700)   potassium chloride  50 mL/hr at 10/19/24 0700    PRN Medications: dextrose , fentaNYL  (SUBLIMAZE ) injection, guaiFENesin -dextromethorphan , ondansetron  (ZOFRAN ) IV, mouth rinse, oxyCODONE , phenol, sodium chloride  flush, sodium chloride  flush, traMADol , traZODone    Assessment:   POD 5 HM 3 LVAD and removal of Impella 5.5 for  Stage D NICM, LVEF < 20% with NYHA class IV symptoms on milrinone  at home. Preop right axillary  Impella 5.5 for optimization due to low output and Co-ox on milrinone  alone.   Moderate to severe pulmonary HTN preop improved with Impella and diuresis. Much better postop after LVAD.    Preop AKI on stage 3a CKD due to low output. Improved with Impella and milrinone . Creat continues to come down despite massive diuresis.   Hx of PVC's and NSVT. Has Biotronik ICD with therapies still off periop.   PAF, maintaining NSR preop on IV amio but back in AF postop.   Preop thrombocytopenia, HIT negative. Improved postop with plt transfusion. Resolved postop and plts back to normal   Expected acute postop blood loss anemia. Stable.  Plan/Discussion:    Hemodynamics stable on current regimen with normal LVAD function. Co-ox has been marginal over the weekend but expect this to improve with continued diuresis. Inotropes per AHF team.  AF with RVR. Continue IV amio, replace K+  Will remove temp pacing wires before INR gets too high.  Continue Coumadin  per pharmacy. INR rising now.   IS, PT, OT. Potentially CIR candidate.  I reviewed the LVAD parameters from today, and compared the results to the patient's prior recorded data.  No programming changes were made.  The LVAD is functioning within specified parameters.  LVAD interrogation was negative for any significant power changes, alarms or PI events/speed drops.    Length of Stay: [address]  Dorise POUR [facility] 10/19/2024, 7:03 AM

## 2024-10-20 ENCOUNTER — Other Ambulatory Visit (HOSPITAL_COMMUNITY): Payer: Self-pay

## 2024-10-20 DIAGNOSIS — I272 Pulmonary hypertension, unspecified: Secondary | ICD-10-CM | POA: Diagnosis not present

## 2024-10-20 DIAGNOSIS — I5023 Acute on chronic systolic (congestive) heart failure: Secondary | ICD-10-CM | POA: Diagnosis not present

## 2024-10-20 DIAGNOSIS — Z95811 Presence of heart assist device: Secondary | ICD-10-CM | POA: Diagnosis not present

## 2024-10-20 DIAGNOSIS — I4891 Unspecified atrial fibrillation: Secondary | ICD-10-CM | POA: Diagnosis not present

## 2024-10-20 DIAGNOSIS — I472 Ventricular tachycardia, unspecified: Secondary | ICD-10-CM | POA: Diagnosis not present

## 2024-10-20 DIAGNOSIS — I480 Paroxysmal atrial fibrillation: Secondary | ICD-10-CM | POA: Diagnosis not present

## 2024-10-20 LAB — COOXEMETRY PANEL
Carboxyhemoglobin: 1.8 % — ABNORMAL HIGH (ref 0.5–1.5)
Methemoglobin: <0.7 % (ref 0.0–1.5)
O2 Saturation: 55.7 %
Total hemoglobin: 9 g/dL — ABNORMAL LOW (ref 12.0–16.0)

## 2024-10-20 LAB — POCT I-STAT 7, (LYTES, BLD GAS, ICA,H+H)
Acid-Base Excess: 8 mmol/L — ABNORMAL HIGH (ref 0.0–2.0)
Bicarbonate: 31.4 mmol/L — ABNORMAL HIGH (ref 20.0–28.0)
Calcium, Ion: 1.1 mmol/L — ABNORMAL LOW (ref 1.15–1.40)
HCT: 27 % — ABNORMAL LOW (ref 39.0–52.0)
Hemoglobin: 9.2 g/dL — ABNORMAL LOW (ref 13.0–17.0)
O2 Saturation: 97 %
Patient temperature: 37.1
Potassium: 3 mmol/L — ABNORMAL LOW (ref 3.5–5.1)
Sodium: 130 mmol/L — ABNORMAL LOW (ref 135–145)
TCO2: 32 mmol/L (ref 22–32)
pCO2 arterial: 36.9 mmHg (ref 32–48)
pH, Arterial: 7.538 — ABNORMAL HIGH (ref 7.35–7.45)
pO2, Arterial: 78 mmHg — ABNORMAL LOW (ref 83–108)

## 2024-10-20 LAB — BASIC METABOLIC PANEL WITH GFR
Anion gap: 11 (ref 5–15)
Anion gap: 13 (ref 5–15)
BUN: 39 mg/dL — ABNORMAL HIGH (ref 8–23)
BUN: 41 mg/dL — ABNORMAL HIGH (ref 8–23)
CO2: 32 mmol/L (ref 22–32)
CO2: 30 mmol/L (ref 22–32)
Calcium: 8.8 mg/dL — ABNORMAL LOW (ref 8.9–10.3)
Calcium: 8.8 mg/dL — ABNORMAL LOW (ref 8.9–10.3)
Chloride: 87 mmol/L — ABNORMAL LOW (ref 98–111)
Chloride: 88 mmol/L — ABNORMAL LOW (ref 98–111)
Creatinine, Ser: 1.67 mg/dL — ABNORMAL HIGH (ref 0.61–1.24)
Creatinine, Ser: 1.6 mg/dL — ABNORMAL HIGH (ref 0.61–1.24)
GFR, Estimated: 46 mL/min — ABNORMAL LOW
GFR, Estimated: 48 mL/min — ABNORMAL LOW
Glucose, Bld: 220 mg/dL — ABNORMAL HIGH (ref 70–99)
Glucose, Bld: 144 mg/dL — ABNORMAL HIGH (ref 70–99)
Potassium: 3.2 mmol/L — ABNORMAL LOW (ref 3.5–5.1)
Potassium: 3.3 mmol/L — ABNORMAL LOW (ref 3.5–5.1)
Sodium: 130 mmol/L — ABNORMAL LOW (ref 135–145)
Sodium: 131 mmol/L — ABNORMAL LOW (ref 135–145)

## 2024-10-20 LAB — GLUCOSE, CAPILLARY
Glucose-Capillary: 261 mg/dL — ABNORMAL HIGH (ref 70–99)
Glucose-Capillary: 146 mg/dL — ABNORMAL HIGH (ref 70–99)
Glucose-Capillary: 121 mg/dL — ABNORMAL HIGH (ref 70–99)
Glucose-Capillary: 232 mg/dL — ABNORMAL HIGH (ref 70–99)

## 2024-10-20 LAB — CBC WITH DIFFERENTIAL/PLATELET
Abs Immature Granulocytes: 0.2 K/uL — ABNORMAL HIGH (ref 0.00–0.07)
Basophils Absolute: 0 K/uL (ref 0.0–0.1)
Basophils Relative: 0 %
Eosinophils Absolute: 0 K/uL (ref 0.0–0.5)
Eosinophils Relative: 0 %
HCT: 25.7 % — ABNORMAL LOW (ref 39.0–52.0)
Hemoglobin: 8.7 g/dL — ABNORMAL LOW (ref 13.0–17.0)
Immature Granulocytes: 2 %
Lymphocytes Relative: 4 %
Lymphs Abs: 0.4 K/uL — ABNORMAL LOW (ref 0.7–4.0)
MCH: 31 pg (ref 26.0–34.0)
MCHC: 33.9 g/dL (ref 30.0–36.0)
MCV: 91.5 fL (ref 80.0–100.0)
Monocytes Absolute: 0.8 K/uL (ref 0.1–1.0)
Monocytes Relative: 8 %
Neutro Abs: 8.4 K/uL — ABNORMAL HIGH (ref 1.7–7.7)
Neutrophils Relative %: 86 %
Platelets: 205 K/uL (ref 150–400)
RBC: 2.81 MIL/uL — ABNORMAL LOW (ref 4.22–5.81)
RDW: 17.7 % — ABNORMAL HIGH (ref 11.5–15.5)
WBC: 9.8 K/uL (ref 4.0–10.5)
nRBC: 1.7 % — ABNORMAL HIGH (ref 0.0–0.2)

## 2024-10-20 LAB — PHOSPHORUS: Phosphorus: 2.5 mg/dL (ref 2.5–4.6)

## 2024-10-20 LAB — LACTATE DEHYDROGENASE: LDH: 724 U/L — ABNORMAL HIGH (ref 105–235)

## 2024-10-20 LAB — PROTIME-INR
INR: 1.7 — ABNORMAL HIGH (ref 0.8–1.2)
Prothrombin Time: 20.9 s — ABNORMAL HIGH (ref 11.4–15.2)

## 2024-10-20 LAB — MAGNESIUM: Magnesium: 2 mg/dL (ref 1.7–2.4)

## 2024-10-20 MED ORDER — WARFARIN SODIUM 2 MG PO TABS
4.0000 mg | ORAL_TABLET | Freq: Once | ORAL | Status: AC
  Administered 2024-10-20: 4 mg via ORAL
  Filled 2024-10-20: qty 2

## 2024-10-20 MED ORDER — SENNA 8.6 MG PO TABS
2.0000 | ORAL_TABLET | Freq: Every evening | ORAL | Status: DC | PRN
  Administered 2024-10-28: 17.2 mg via ORAL
  Filled 2024-10-20: qty 2

## 2024-10-20 MED ORDER — POTASSIUM CHLORIDE 20 MEQ PO PACK
40.0000 meq | PACK | ORAL | Status: AC
  Administered 2024-10-20 – 2024-10-21 (×2): 40 meq via ORAL
  Filled 2024-10-20 (×2): qty 2

## 2024-10-20 MED ORDER — BOOST PLUS PO LIQD
237.0000 mL | Freq: Three times a day (TID) | ORAL | Status: DC
  Administered 2024-10-20 – 2024-10-31 (×20): 237 mL via ORAL
  Filled 2024-10-20 (×40): qty 237

## 2024-10-20 MED ORDER — POTASSIUM CHLORIDE CRYS ER 20 MEQ PO TBCR: 40.0000 meq | EXTENDED_RELEASE_TABLET | ORAL | Status: DC

## 2024-10-20 MED ORDER — OXYCODONE HCL 5 MG PO TABS
5.0000 mg | ORAL_TABLET | ORAL | Status: DC | PRN
  Administered 2024-10-25 – 2024-11-01 (×19): 5 mg via ORAL
  Filled 2024-10-20 (×19): qty 1

## 2024-10-20 MED ORDER — SODIUM CHLORIDE 0.9 % IV SOLN
1.0000 ug/min | INTRAVENOUS | Status: DC
  Administered 2024-10-20: 2 ug/min via INTRAVENOUS
  Filled 2024-10-20: qty 10

## 2024-10-20 MED ORDER — MAGNESIUM SULFATE 2 GM/50ML IV SOLN
2.0000 g | Freq: Once | INTRAVENOUS | Status: AC
  Administered 2024-10-20: 2 g via INTRAVENOUS
  Filled 2024-10-20: qty 50

## 2024-10-20 MED ORDER — POLYETHYLENE GLYCOL 3350 17 G PO PACK
17.0000 g | PACK | Freq: Every day | ORAL | Status: DC | PRN
  Administered 2024-10-28: 17 g via ORAL
  Filled 2024-10-20: qty 1

## 2024-10-20 MED ORDER — INSULIN GLARGINE-YFGN 100 UNIT/ML ~~LOC~~ SOLN
20.0000 [IU] | Freq: Every day | SUBCUTANEOUS | Status: DC
  Administered 2024-10-21 – 2024-10-22 (×2): 20 [IU] via SUBCUTANEOUS
  Filled 2024-10-20 (×3): qty 0.2

## 2024-10-20 MED ORDER — POTASSIUM CHLORIDE 10 MEQ/50ML IV SOLN
10.0000 meq | INTRAVENOUS | Status: AC
  Administered 2024-10-20 (×6): 10 meq via INTRAVENOUS
  Filled 2024-10-20 (×6): qty 50

## 2024-10-20 MED ORDER — ACETAZOLAMIDE SODIUM 500 MG IJ SOLR
500.0000 mg | Freq: Once | INTRAMUSCULAR | Status: AC
  Administered 2024-10-20: 500 mg via INTRAVENOUS
  Filled 2024-10-20: qty 500

## 2024-10-20 MED ORDER — FUROSEMIDE 10 MG/ML IJ SOLN
80.0000 mg | Freq: Two times a day (BID) | INTRAMUSCULAR | Status: DC
  Administered 2024-10-20 – 2024-10-22 (×4): 80 mg via INTRAVENOUS
  Filled 2024-10-20 (×5): qty 8

## 2024-10-20 MED ORDER — SENNA 8.6 MG PO TABS: 2.0000 | ORAL_TABLET | Freq: Every evening | ORAL | Status: DC | PRN

## 2024-10-20 MED ORDER — POTASSIUM CHLORIDE 20 MEQ PO PACK
40.0000 meq | PACK | ORAL | Status: AC
  Administered 2024-10-20: 40 meq via ORAL
  Filled 2024-10-20: qty 2

## 2024-10-20 MED ORDER — INSULIN GLARGINE 100 UNIT/ML ~~LOC~~ SOLN
20.0000 [IU] | Freq: Every day | SUBCUTANEOUS | Status: DC
  Administered 2024-10-20: 20 [IU] via SUBCUTANEOUS
  Filled 2024-10-20 (×2): qty 0.2

## 2024-10-20 NOTE — Progress Notes (Signed)
 LVAD Coordinator Rounding Note:  Admitted 10/01/24 for optimization prior to LVAD implant. Impella 5.5 placed 10/06/24.  HM 3 LVAD implanted on 10/14/24 by Dr Lucas under destination therapy criteria.  12/18: Milrinone  titrated from 0.25 to 0.5 mcg/kg/min post RHC d/t low-output and pulmonary hypertension 12/19: NE added 12/22: Markedly elevated PA pressures, elevated PCWP and elevated SVR. Titrated off NE. Added DBA.  12/23: Impella 5.5 Implanted.  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 Heparin  > Bival.  12/31: HM III LVAD implant 1/5: LVAD speed increased to 5400   Pt lying in bed on arrival. [place] being removed today. Pt is weak. CIR following.   Vital signs: Temp: 99.3 HR: 112 Doppler Pressure: none documented Arterial BP: 86/76 (80) Automatic BP: 82/53 (63) O2 Sat: 98% on RA Wt: 235.4>244.3>243.4>234.8>223.5 lb    LVAD interrogation reveals:  Speed: 5400 Flow: 5.3 Power: 3.7 PI: 2.9   Alarms: none Events:  none Hematocrit: 25  Fixed speed: 5400 Low speed limit: 5100   Drive Line: Existing VAD dressing removed and site care performed using sterile technique. Drive line exit site cleaned with Chlora prep applicators x 2, allowed to dry, and gauze dressing with Silverlon patch applied. Exit site unincorporated, the velour is fully implanted at exit site. Moderate amount of serosanguinous drainage present on previous dressing. No redness, tenderness, foul odor or rash noted. Cath grip anchor secure. Continue daily dressing changes by VAD coordinator or nurse champion. Next dressing change due 10/21/24.        Labs:  LDH trend: 703>615>541>598>724  INR trend: 1.3>1.4>1.4>1.6>1.7  Anticoagulation Plan: - INR Goal: 2.0 - 2.5 - Coumadin  dosing per pharmacy  Blood Products:  Intra-op 10/14/24:   4 FFP  2 platelets  700 cellsaver  DDAVP  x 2 Post-op:  10/15/24: 1 PRBC  Device: - Biotronik -Therapies: OFF  Arrythmias: PVCs/NSVT- currently on Amiodarone  drip. On Mexiletine  200 mg TID.  Respiratory: extubated 10/15/24  Infection:   Renal:  - BUN/CRT: 36/1.54  Drips:  Epinephrine  3 mcg/min Milrinone  0.25 mcg/kg/min Amiodarone  60 mg/hr  Adverse Events on VAD:  Patient Education:  Pt perform self test today independently Equipment and back up bag reviewed today during physical therapy  Plan/Recommendations:  1. Page VAD coordinator for any equipment or drive line concerns 2. Daily drive line dressing changes by VAD coordinator or nurse champion  Lauraine Ip RN, BSN VAD Coordinator 24/7 Pager [phone]

## 2024-10-20 NOTE — Progress Notes (Addendum)
 Initial Nutrition Assessment  DOCUMENTATION CODES:   Not applicable  INTERVENTION:   Continue current diet order for now Allow double protein on meal trays  Boost Plus po TID (Chocolate), each supplement provides 360 kcal and 14 grams of protein  Pt with high nutritional needs and needs oral nutrition supplements and/or snacks to meet nutritional needs even if eating well at meal times (current meal tray limited by carbohydrate, sodium and fat restriction)   NUTRITION DIAGNOSIS:   Increased nutrient needs related to post-op healing, catabolic illness as evidenced by estimated needs.  Being addressed via double protein, supplements  GOAL:   Patient will meet greater than or equal to 90% of their needs  Progressing  MONITOR:   PO intake, Supplement acceptance, Labs, Weight trends, I & O's  REASON FOR ASSESSMENT:   Consult Assessment of nutrition requirement/status  ASSESSMENT:   64 yo male admitted for pre-op optimization for LVAD implant. Pt with acute on chronic systolic CHF (NICM)-last Echo 0/74 with EF 10-15%, severe LV dilation, mild-mod RV dilation, moderate MR, moderate TR, ICD implantation. Additional PMH includes DM, COPD, HTN, tobacco abuse (quit 2022), inguinal hernia repair  12/18 Admitted 12/23 Impella 5.5 12/31 HM3 LVAD implanted, Impella 5.5 removed 01/01 Extubated  Remains on Epinephrine -currently at 2, Milrinone  gtt  Pt sitting up in chair position the bed on visit today. Pt getting ready to eat lunch on visit today, pt with sandwich on meal tray. Also noted  Ensure on meal tray.   Pt was drinking Ensure Plus High Protein prior to surgery, no order for Ensure currently. Per RN and per pt, pt has been drinking them daily, multiple and likes. Pt prefers Chocolate flavor but Ensure Chocolate currently unavailable; discussed trial of Boost Plus Chocolate with pt, pt agrees  Pt denies any nausea or vomiting, no abdominal discomfort. +BMs, 3 in last 24  hours, previously all liquid but starting to have thicken  CBGs 164-261, hx of DM. Noted DM coordinator recommending considering 3 units Novolog  TID with meals in addition to sliding scale and lantus   No longer on calcium  carbonate BID, phosphorus wdl  Current Wt: 101.4 kg Weight down to 96.5 kg on 12/30 with wt of 99 kg on the day of surgery. Weight up post op but trending back down Admit Wt: 104.3 kg with highest wt of 111 kg on 1/03  Labs: Sodium 130 (L) Potassium 3.2 (L) BUN 39 Creatinine 1.67 Phosphorus 2.5 (wdl) Magnesium  2.0 (wdl)  Meds: Lasix  BID SS novolog  with meals and bedtime Lantus  20 units daily Received mag and potassium today   Diet Order:   Diet Order             Diet heart healthy/carb modified Room service appropriate? Yes; Fluid consistency: Thin  Diet effective now                   EDUCATION NEEDS:   Education needs have been addressed  Skin:  Skin Assessment: Skin Integrity Issues: Skin Integrity Issues:: Incisions Incisions: R chest (closed), new Driveline (HM3 implantation on 12/31)  Last BM:  1/6 type 5-6 large  Height:   Ht Readings from Last 1 Encounters:  10/19/24 6' 6 (1.981 m)    Weight:   Wt Readings from Last 1 Encounters:  10/20/24 101.4 kg    BMI:  Body mass index is 25.83 kg/m.  Estimated Nutritional Needs:   Kcal:  2400-2600 kcals  Protein:  140-175 g  Fluid:  1.8 L   Katharine Rochefort  Donal MS, RDN, LDN, CNSC Registered Dietitian 3 Clinical Nutrition RD Inpatient Contact Info in Amion

## 2024-10-20 NOTE — Plan of Care (Signed)
" °  Problem: Education: Goal: Understanding of CV disease, CV risk reduction, and recovery process will improve Outcome: Progressing   Problem: Health Behavior/Discharge Planning: Goal: Ability to manage health-related needs will improve Outcome: Progressing   Problem: Clinical Measurements: Goal: Ability to maintain clinical measurements within normal limits will improve Outcome: Progressing   Problem: Clinical Measurements: Goal: Will remain free from infection Outcome: Progressing   Problem: Clinical Measurements: Goal: Diagnostic test results will improve Outcome: Progressing   Problem: Clinical Measurements: Goal: Cardiovascular complication will be avoided Outcome: Progressing   Problem: Coping: Goal: Level of anxiety will decrease Outcome: Progressing   Problem: Elimination: Goal: Will not experience complications related to bowel motility Outcome: Progressing   Problem: Skin Integrity: Goal: Risk for impaired skin integrity will decrease Outcome: Progressing   Problem: Fluid Volume: Goal: Ability to maintain a balanced intake and output will improve Outcome: Progressing   "

## 2024-10-20 NOTE — PMR Pre-admission (Signed)
 PMR Admission Coordinator Pre-Admission Assessment  Patient: James [name] is an 64 y.o., male MRN: [id number] DOB: 11-23-1960 Height: 6' 6 (198.1 cm) Weight: 91.1 kg  Insurance Information HMO:     PPO: yes     PCP: IPA:      80/20:      OTHER:  PRIMARY: BCBS Commercial PPO      Policy#: [id number]       Subscriber: patient CM Name: Newell      Phone#: [phone]     Fax#: [phone] Pre-Cert#: [id number]  Received insurance authorization via fax from [place] with [employer] on 10/31/24. Pt approved for 7 days from 10/31/24-11/07/24. Updates due on 11/05/24.    Employer:  Benefits:  Phone #: [phone] (verified on Blue-E)     Name:  Eff. Date: 07/15/18-10/14/9998     Deduct: $1,500 ($0 met)      Out of Pocket Max: $6,600 ($0 met)      Life Max: NA CIR: 80% coverage, 20% co-insurance      SNF: no coverage Outpatient: 80% coverage     Co-Pay: 20% co-insurance Home Health: 80% coverage      Co-Pay: 20% co-insurance DME: 80% coverage     Co-Pay: 20% co-insurance Providers: pt's choice SECONDARY:       Policy#:      Phone#:   Artist:       Phone#:   The [employer] [facility] for patients in Inpatient Rehabilitation Facilities with attached Privacy Act Statement-Health Care Records was provided and verbally reviewed with: Patient  Emergency Contact Information Contact Information     Name Relation Home Work Collins Spouse [phone]      Other Contacts     Name Relation Home Work Mobile   PRUITT,VICTORIA Daughter [phone]  [phone]       Current Medical History  Patient Admitting Diagnosis: HF, new LVAD  History of Present Illness: James [name] is a 64 y.o. male with HFrEF/nonischemic cardiomyopathy s/p ICD, HTN, T2DM, VT, aortic root aneurysm, prostate cancer s/p EBRT and ADT, meningioma. HF da Completed radiation for prostate cancer April 2025. He was also referred to Neurology for mass  noted on CT of head. Echo 7/25 showed EF 25-30%, G3DD, RV mildly reduced, moderate MR.  Initial plan was to follow-up with [facility] for transplant. After discussion with family, patient decided he would prefer to consider LVAD. He was being admitted for pre-VAD optimization prior to planned surgery. He admitted to [facility] 10/01/24.  On 10/06/24, being admitted for pre-VAD optimization prior to planned surgery on 10/06/24. On 10/14/24 HM III LVAD implant. Pt. Was seen by PT/OT and they recommend CIR to assist return to PLOF.      Patient's medical record from [facility] has been reviewed by the rehabilitation admission coordinator and physician.  Past Medical History  Past Medical History:  Diagnosis Date   CKD (chronic kidney disease) stage 2, GFR 60-89 ml/min    COPD (chronic obstructive pulmonary disease) (HCC)    Diabetes mellitus type II, non insulin  dependent (HCC)    Dilated aortic root    Elevated PSA    Hypercholesteremia    Hypertension    NICM (nonischemic cardiomyopathy) (HCC)    NSVT (nonsustained ventricular tachycardia) (HCC)    Obstructive sleep apnea    Paroxysmal atrial fibrillation (HCC)    Pulmonary hypertension (HCC)    PVC's (premature ventricular contractions)    Tobacco abuse  Has the patient had major surgery during 100 days prior to admission? Yes  Family History   family history includes Cardiomyopathy in his brother; Coronary artery disease in his mother; Diabetes in his father; Heart attack in his father; Heart disease in his maternal grandfather; Hypertension in his father; Iron deficiency in his father; Sudden death in an other family member; Thyroid  disease in his sister.  Current Medications Current Medications[1]  Patients Current Diet:  Diet Order             Diet regular Room service appropriate? Yes; Fluid consistency: Thin  Diet effective now                   Precautions /  Restrictions Precautions Precautions: Fall Precaution Booklet Issued: No Precaution/Restrictions Comments: LVAD Restrictions Weight Bearing Restrictions Per Provider Order: No RUE Weight Bearing Per Provider Order: Non weight bearing LUE Weight Bearing Per Provider Order: Non weight bearing Other Position/Activity Restrictions: sternal precautions   Has the patient had 2 or more falls or a fall with injury in the past year? No  Prior Activity Level Community (5-7x/wk): Pt. active in the community PTA  Prior Functional Level Self Care: Did the patient need help bathing, dressing, using the toilet or eating? Independent  Indoor Mobility: Did the patient need assistance with walking from room to room (with or without device)? Independent  Stairs: Did the patient need assistance with internal or external stairs (with or without device)? Independent  Functional Cognition: Did the patient need help planning regular tasks such as shopping or remembering to take medications? Independent  Patient Information Are you of Hispanic, Latino/a,or Spanish origin?: A. No, not of Hispanic, Latino/a, or Spanish origin What is your race?: B. Black or African American Do you need or want an interpreter to communicate with a doctor or health care staff?: 0. No  Patient's Response To:  Health Literacy and Transportation Is the patient able to respond to health literacy and transportation needs?: Yes Health Literacy - How often do you need to have someone help you when you read instructions, pamphlets, or other written material from your doctor or pharmacy?: Never In the past 12 months, has lack of transportation kept you from medical appointments or from getting medications?: No In the past 12 months, has lack of transportation kept you from meetings, work, or from getting things needed for daily living?: No  Home Assistive Devices / Equipment Home Equipment: Hand held shower head  Prior Device Use:  Indicate devices/aids used by the patient prior to current illness, exacerbation or injury? None of the above  Current Functional Level Cognition  Orientation Level: Oriented X4    Extremity Assessment (includes Sensation/Coordination)  Upper Extremity Assessment: RUE deficits/detail, LUE deficits/detail RUE Deficits / Details: decreased fine motor, sluggish strength overall 4-/5 grasp RUE Coordination: decreased fine motor LUE Deficits / Details: decreased fine motor generally 4-/5 LUE Coordination: decreased fine motor  Lower Extremity Assessment: Defer to PT evaluation    ADLs  Overall ADL's : Needs assistance/impaired Eating/Feeding: Set up Eating/Feeding Details (indicate cue type and reason): assist for opening containers, seasoning packets etc - Pt attempted but ultimately required assist Grooming: Set up, Sitting, Wash/dry hands Grooming Details (indicate cue type and reason): dependent on BUE for standing balance - requires seated position for safety Upper Body Bathing: Minimal assistance, Sitting Lower Body Bathing: Maximal assistance, Sit to/from stand Upper Body Dressing : Moderate assistance, Sitting Upper Body Dressing Details (indicate cue type and reason): able  to place harness over neck Lower Body Dressing: Maximal assistance Lower Body Dressing Details (indicate cue type and reason): socks Toilet Transfer: Moderate assistance, Rolling walker (2 wheels), Ambulation Toilet Transfer Details (indicate cue type and reason): mod A to boost into standing even from elevated bed Toileting- Clothing Manipulation and Hygiene: Maximal assistance, Sit to/from stand Toileting - Clothing Manipulation Details (indicate cue type and reason): rear peri care, Pt educated on compensatory strategies Functional mobility during ADLs: Moderate assistance, Rolling walker (2 wheels) General ADL Comments: working on power switch fom cord ot battery. Requires MAX A to organzie, sequence and  maintain attention to task; poor error recognition    Mobility  Overal bed mobility: Needs Assistance Bed Mobility: Supine to Sit, Sit to Supine Rolling: Min assist Sidelying to sit: Min assist Supine to sit: Min assist Sit to supine: Min assist Sit to sidelying: Mod assist, +2 for physical assistance, HOB elevated General bed mobility comments: pt able to come to EoB with HOB elevated and cues to not pull up, min A for trunk elevation    Transfers  Overall transfer level: Needs assistance Equipment used: Rolling walker (2 wheels) Transfers: Sit to/from Stand Sit to Stand: Mod assist, From elevated surface Bed to/from chair/wheelchair/BSC transfer type:: Step pivot Step pivot transfers: +2 physical assistance, Min assist General transfer comment: Pt able to scoot to EOB with mod A, HIGHLY elevated surface, and mod A with rocking momentum to get him on his feet. He required use of gait belt and max A to stand from 3 in1 set to high level over the toilet. Due to his height and body mass his bottom gets stuck in the 3 in 1    Ambulation / Gait / Stairs / Wheelchair Mobility  Ambulation/Gait Ambulation/Gait assistance: Min assist, +2 safety/equipment Gait Distance (Feet): 400 Feet Assistive device: Rolling walker (2 wheels) Gait Pattern/deviations: Decreased stride length, Step-through pattern General Gait Details: pt continues to benefit from RW to keep upright posture Gait velocity: reduced Gait velocity interpretation: <1.8 ft/sec, indicate of risk for recurrent falls Pre-gait activities: Static march with BIL UEs on RW.    Posture / Balance Dynamic Sitting Balance Sitting balance - Comments: posterior bias Balance Overall balance assessment: Needs assistance Sitting-balance support: No upper extremity supported, Feet supported Sitting balance-Leahy Scale: Fair Sitting balance - Comments: posterior bias Postural control: Left lateral lean Standing balance support: Bilateral  upper extremity supported Standing balance-Leahy Scale: Fair Standing balance comment: benefits from RW for stabilization and bringing weight forward instead of leaning back    Special considerations/life events  Skin Mid sternal chest incision with LVAD in place; coccyx pressure injury and Special service needs LVAD education ongoing   Previous Home Environment (from acute therapy documentation) Living Arrangements: Spouse/significant other  Lives With: Spouse Available Help at Discharge: Family (potentially can arrange 24/7, wife works second shift) Type of Home: House Home Layout: One level Home Access: Stairs to enter [employer]: Can reach both (on x4 stairs in back) Entrance Stairs-Number of Steps: 4 (in back (easiest access to home per pt), 6 at side) Bathroom Shower/Tub: Engineer[employer]: Standard Bathroom Accessibility: Yes How Accessible: Accessible via walker, Accessible via wheelchair Home Care Services: No  Discharge Living Setting Plans for Discharge Living Setting: Patient's home Type of Home at Discharge: House Discharge Home Layout: One level Discharge Home Access: Stairs to enter Entrance Stairs-Rails: Right Discharge Bathroom Shower/Tub: Tub/shower unit Discharge Bathroom Toilet: Standard Discharge Bathroom Accessibility: Yes How Accessible: Accessible via walker,  Accessible via wheelchair Does the patient have any problems obtaining your medications?: No  Social/Family/Support Systems Patient Roles: Spouse Contact Information: [phone] Anticipated Caregiver: [name] Anticipated Caregiver's Contact Information: Wife works, adult daughter is CNA and can stay with pt while she works Ability/Limitations of Caregiver: none stated Caregiver Availability: 24/7 Discharge Plan Discussed with Primary Caregiver: Yes Is Caregiver In Agreement with Plan?: No Does Caregiver/Family have Issues with Lodging/Transportation while Pt is in  Rehab?: No  Goals Patient/Family Goal for Rehab: PT/OT Mod I Expected length of stay: 5-7 days Pt/Family Agrees to Admission and willing to participate: Yes Program Orientation Provided & Reviewed with Pt/Caregiver Including Roles  & Responsibilities: Yes  Decrease burden of Care through IP rehab admission: not anticipated  Possible need for SNF placement upon discharge: not anticipated  Patient Condition: I have reviewed medical records from [facility], spoken with CM, and patient and spouse. I met with patient at the bedside for inpatient rehabilitation assessment.  Patient will benefit from ongoing PT and OT, can actively participate in 3 hours of therapy a day 5 days of the week, and can make measurable gains during the admission.  Patient will also benefit from the coordinated team approach during an Inpatient Acute Rehabilitation admission.  The patient will receive intensive therapy as well as Rehabilitation physician, nursing, social worker, and care management interventions.  Due to safety, skin/wound care, disease management, medication administration, pain management, and patient education the patient requires 24 hour a day rehabilitation nursing.  The patient is currently min to mod assist with mobility and basic ADLs.  Discharge setting and therapy post discharge at home with home health is anticipated.  Patient has agreed to participate in the Acute Inpatient Rehabilitation Program and will admit today.  Preadmission Screen Completed By:  Leita KATHEE Kleine, 11/02/2024 10:46 AM ______________________________________________________________________   Discussed status with Dr. Babs on 11/02/24 at 0930 and received approval for admission today.  Admission Coordinator:  Leita KATHEE Kleine, CCC-SLP, and Lugene Ropes, RN at time 1048/Date 11/02/24   Assessment/Plan: Diagnosis: debility after HF,LVAD Does the need for close, 24 hr/day Medical supervision in concert with the  patient's rehab needs make it unreasonable for this patient to be served in a less intensive setting? Yes Co-Morbidities requiring supervision/potential complications: HTN, DM, prostate cancer Due to bladder management, bowel management, safety, skin/wound care, disease management, medication administration, pain management, and patient education, does the patient require 24 hr/day rehab nursing? Yes Does the patient require coordinated care of a physician, rehab nurse, PT, OT, and SLP to address physical and functional deficits in the context of the above medical diagnosis(es)? Yes Addressing deficits in the following areas: balance, endurance, locomotion, strength, transferring, bowel/bladder control, bathing, dressing, feeding, grooming, toileting, and psychosocial support Can the patient actively participate in an intensive therapy program of at least 3 hrs of therapy 5 days a week? Yes The potential for patient to make measurable gains while on inpatient rehab is excellent Anticipated functional outcomes upon discharge from inpatient rehab: modified independent PT, modified independent OT, n/a SLP Estimated rehab length of stay to reach the above functional goals is: 5-7 days Anticipated discharge destination: Home 10. Overall Rehab/Functional Prognosis: excellent   MD Signature: Arthea IVAR Babs, MD, [facility] Physical Medicine & Rehabilitation Medical Director Rehabilitation Services 11/02/2024      [1]  Current Facility-Administered Medications:    amiodarone  (PACERONE ) tablet 200 mg, 200 mg, Oral, BID, Lee, Jordan, NP, 200 mg at 11/02/24 [phone]  Chlorhexidine  Gluconate Cloth 2 % PADS 6 each, 6 each, Topical, Daily, Zenaida Morene PARAS, MD, 6 each at 11/02/24 9050   collagenase  (SANTYL ) ointment, , Topical, Daily, Bensimhon, Toribio SAUNDERS, MD, Given at 11/02/24 [phone]   digoxin  (LANOXIN ) tablet 0.0625 mg, 0.0625 mg, Oral, Daily, McLean, Dalton S, MD, 0.0625 mg at 11/02/24 9051    furosemide  (LASIX ) tablet 40 mg, 40 mg, Oral, Daily, Lee, Jordan, NP, 40 mg at 11/02/24 9051   gabapentin  (NEURONTIN ) capsule 200 mg, 200 mg, Oral, QHS, Gretel Prentice BIRCH, RPH, 200 mg at 11/01/24 2118   guaiFENesin -dextromethorphan  (ROBITUSSIN DM) 100-10 MG/5ML syrup 5 mL, 5 mL, Oral, Q4H PRN, Zenaida Morene PARAS, MD, 5 mL at 10/24/24 1351   insulin  aspart (novoLOG ) injection 0-15 Units, 0-15 Units, Subcutaneous, TID WC, Hendrickson, Steven C, MD, 2 Units at 10/30/24 1649   insulin  aspart (novoLOG ) injection 0-5 Units, 0-5 Units, Subcutaneous, QHS, Kerrin Elspeth BROCKS, MD, 2 Units at 10/20/24 2134   lactose free nutrition (BOOST PLUS) liquid 237 mL, 237 mL, Oral, Daily, Bensimhon, Daniel R, MD   mexiletine (MEXITIL ) capsule 150 mg, 150 mg, Oral, Q12H, McLean, Dalton S, MD, 150 mg at 11/02/24 9050   multivitamin with minerals tablet 1 tablet, 1 tablet, Oral, Daily, Bensimhon, Daniel R, MD, 1 tablet at 11/02/24 9050   ondansetron  (ZOFRAN ) injection 4 mg, 4 mg, Intravenous, Q6H PRN, Bartle, Bryan K, MD, 4 mg at 10/16/24 1656   Oral care mouth rinse, 15 mL, Mouth Rinse, PRN, Zenaida Morene PARAS, MD   oxyCODONE  (Oxy IR/ROXICODONE ) immediate release tablet 5 mg, 5 mg, Oral, Q3H PRN, Marcine Catalan M, PA-C, 5 mg at 11/01/24 2351   pantoprazole  (PROTONIX ) EC tablet 40 mg, 40 mg, Oral, Daily, Bowser, Grace E, NP, 40 mg at 11/02/24 0949   phenol (CHLORASEPTIC) mouth spray 1 spray, 1 spray, Mouth/Throat, PRN, Zenaida Morene PARAS, MD, 1 spray at 10/27/24 0601   polyethylene glycol (MIRALAX  / GLYCOLAX ) packet 17 g, 17 g, Oral, Daily PRN, Zenaida Morene PARAS, MD, 17 g at 10/28/24 0003   polyethylene glycol (MIRALAX  / GLYCOLAX ) packet 17 g, 17 g, Oral, Daily, Lee, Jordan, NP, 17 g at 10/27/24 1031   polyethylene glycol (MIRALAX  / GLYCOLAX ) packet 17 g, 17 g, Oral, Daily, Bensimhon, Daniel R, MD, 17 g at 11/02/24 9051   potassium chloride  (KLOR-CON ) packet 40 mEq, 40 mEq, Oral, Daily, Bensimhon, Toribio SAUNDERS, MD, 40 mEq  at 11/01/24 9085   senna (SENOKOT) tablet 17.2 mg, 2 tablet, Oral, QHS PRN, Zenaida Morene PARAS, MD, 17.2 mg at 10/28/24 0004   senna-docusate (Senokot-S) tablet 2 tablet, 2 tablet, Oral, QHS, Bensimhon, Daniel R, MD, 2 tablet at 11/01/24 2118   sildenafil  (REVATIO ) tablet 20 mg, 20 mg, Oral, TID, Stoner, Benjamin J, MD, 20 mg at 11/02/24 9050   sodium chloride  flush (NS) 0.9 % injection 10-40 mL, 10-40 mL, Intracatheter, Q12H, Zenaida Morene PARAS, MD, 10 mL at 11/01/24 2118   sorbitol , magnesium  hydroxide, mineral oil, glycerin (SMOG) enema, 960 mL, Rectal, Once, Colletta Manuelita Garre, PA-C   spironolactone  (ALDACTONE ) tablet 50 mg, 50 mg, Oral, BID, Lee, Jordan, NP, 50 mg at 11/02/24 9050   Warfarin - Pharmacist Dosing Inpatient, , Does not apply, q1600, Bitonti, Kvon T, [facility], Given at 10/31/24 1600  Facility-Administered Medications Ordered in Other Encounters:    sodium chloride  flush (NS) 0.9 % injection 3 mL, 3 mL, Intravenous, Q12H, Sabharwal, Aditya, DO

## 2024-10-20 NOTE — Progress Notes (Signed)
 HeartMate 3 Rounding Note  Subjective:    No complaints. Feels well. Slept. Sitting up in bed eating breakfast.  -4879 cc yesterday. Wt down 11 lbs. Now about 5 lbs over preop.  Milrinone  0.25, epi 3. Co-ox 55.7. Atrial fib 120-130's on IV amio 60.  LVAD INTERROGATION:  HeartMate IIl LVAD:  Flow 5.4 liters/min, speed 5400, power 4, PI 2.5.     Objective:    Vital Signs:   Temp:  [98.2 F (36.8 C)-99.5 F (37.5 C)] 99.3 F (37.4 C) (01/06 0700) Pulse Rate:  [96-226] 135 (01/06 0700) Resp:  [9-31] 16 (01/06 0700) BP: (80-96)/(53-69) 82/69 (01/06 0400) SpO2:  [75 %-100 %] 98 % (01/06 0700) Arterial Line BP: (69-98)/(58-86) 86/76 (01/06 0700) Weight:  [101.4 kg] 101.4 kg (01/06 0600) Last BM Date : 10/18/24 Mean arterial Pressure 75  Intake/Output:   Intake/Output Summary (Last 24 hours) at 10/20/2024 0708 Last data filed at 10/20/2024 0600 Gross per 24 hour  Intake 2035.54 ml  Output 6915 ml  Net -4879.46 ml     Physical Exam: General:  Well appearing. No resp difficulty HEENT: normal Neck: left neck sleeve and swan still in. Cor: distantheart sounds with LVAD hum present. Lungs: clear Abdomen: soft, nontender, nondistended. Good bowel sounds. Extremities: mild edema in legs Neuro: alert & orientedx3, cranial nerves grossly intact. moves all 4 extremities w/o difficulty. Affect pleasant  Telemetry: atrial fib with RVR 120's-130's  Labs: Basic Metabolic Panel: Recent Labs  Lab 10/16/24 0414 10/16/24 0451 10/17/24 0442 10/17/24 2030 10/18/24 9485 10/18/24 1617 10/19/24 0411 10/19/24 0450 10/19/24 1646 10/20/24 0426 10/20/24 0454  NA 128*   < > 127* 125* 128* 126* 129* 128* 130* 130* 130*  K 4.0   < > 3.7 3.6 3.2* 3.5 3.0* 2.7* 3.2* 3.2* 3.0*  CL 94*   < > 92* 90* 91* 89* 88*  --  89* 87*  --   CO2 22   < > 23 24 25 25 28   --  29 32  --   GLUCOSE 154*   < > 129* 153* 100* 162* 172*  --  176* 220*  --   BUN 24*   < > 35* 38* 35* 39* 36*  --  37* 39*  --    CREATININE 1.62*   < > 1.81* 1.72* 1.56* 1.59* 1.54*  --  1.59* 1.67*  --   CALCIUM  9.0   < > 8.7* 8.6* 8.6* 8.8* 8.7*  --  8.8* 8.8*  --   MG 2.0  --  1.8 1.9 2.2 2.2 2.0  --   --  2.0  --   PHOS 4.4  --  3.3  --  2.3*  --  2.4*  --   --  2.5  --    < > = values in this interval not displayed.    Liver Function Tests: Recent Labs  Lab 10/15/24 0400 10/16/24 0414 10/17/24 0442  AST 82* 102* 56*  ALT 16 15 <5  ALKPHOS 90 87 90  BILITOT 1.3* 1.2 0.6  PROT 5.3* 5.9* 5.7*  ALBUMIN  3.4* 3.6 3.3*   No results for input(s): LIPASE, AMYLASE in the last 168 hours. No results for input(s): AMMONIA in the last 168 hours.  CBC: Recent Labs  Lab 10/16/24 0414 10/16/24 0451 10/17/24 0442 10/18/24 0514 10/18/24 1746 10/19/24 0411 10/19/24 0450 10/20/24 0426 10/20/24 0454  WBC 21.5*  --  18.0* 13.7* 12.7* 11.6*  --  9.8  --   NEUTROABS 19.3*  --  15.9* 11.9*  --  9.9*  --  8.4*  --   HGB 8.6*   < > 8.0* 8.0* 8.3* 8.5* 8.8* 8.7* 9.2*  HCT 25.4*   < > 23.6* 23.4* 26.4* 24.4* 26.0* 25.7* 27.0*  MCV 92.4  --  91.8 91.8 97.8 90.0  --  91.5  --   PLT 124*  --  143* 187 203 233  --  205  --    < > = values in this interval not displayed.    INR: Recent Labs  Lab 10/16/24 0414 10/17/24 0442 10/18/24 0514 10/19/24 0411 10/20/24 0426  INR 1.3* 1.4* 1.4* 1.6* 1.7*    Other results: EKG:   Imaging: DG CHEST PORT 1 VIEW Result Date: 10/19/2024 CLINICAL DATA:  PICC line placement EXAM: PORTABLE CHEST 1 VIEW COMPARISON:  10/19/2024, 10/18/2024, 10/14/2024 FINDINGS: Post sternotomy changes. LVAD device is noted. Left-sided single lead pacing device similar in position. Left IJ Swan-Ganz catheter tip projecting over the pulmonary outflow tract. New right upper extremity central venous catheter with tip projecting over the SVC. Cardiomegaly with vascular congestion and diffuse interstitial and ground-glass disease, probable edema. Airspace consolidation left base with probable small  left effusion. Findings do not appear significantly changed compared with radiograph earlier today. IMPRESSION: 1. New right upper extremity central venous catheter with tip projecting over the SVC. 2. Cardiomegaly with vascular congestion and diffuse interstitial and ground-glass disease, probable edema. Airspace consolidation at the left base with probable small left effusion. No significant change compared with radiograph earlier today. Electronically Signed   By: Luke Bun M.D.   On: 10/19/2024 15:17   US  EKG SITE RITE Result Date: 10/19/2024 If Site Rite image not attached, placement could not be confirmed due to current cardiac rhythm.  DG Chest Port 1 View Result Date: 10/19/2024 EXAM: 1 VIEW(S) XRAY OF THE CHEST 10/19/2024 05:39:00 AM COMPARISON: Portable chest Yesterday at 5:31 am. CLINICAL HISTORY: [id number] LVAD (left ventricular assist device) present [facility]) [id number] LVAD (left ventricular assist device) present [facility]) [id number] FINDINGS: LINES, TUBES AND DEVICES: Left chest single lead AICD. Left IJ Swan Ganz catheter with tip in the pulmonary outflow tract. LVAD positioning appears similar. The left chest tube previously in place has been removed. LUNGS AND PLEURA: Central vascular congestion and lower zonal interstitial edema appear similar. Small layering pleural effusions. Asymmetric consolidation or atelectasis in the left lower lobe. The remaining lungs appear clear. Overall aeration seems unchanged. No measurable pneumothorax. HEART AND MEDIASTINUM: The heart is enlarged. Stable mediastinum with aortic atherosclerosis. Median sternotomy and mediastinal surgical clips are again shown. BONES AND SOFT TISSUES: No acute osseous abnormality. IMPRESSION: 1. LVAD and support devices are in similar position, with interval removal of the left chest tube and no appreciable pneumothorax. 2. Stable mild pulmonary edema with small pleural effusions and left lower lobe atelectasis or consolidation.  Electronically signed by: Francis Quam MD 10/19/2024 07:14 AM EST RP Workstation: [id number]     Medications:     Scheduled Medications:  bisacodyl   10 mg Oral Daily   Or   bisacodyl   10 mg Rectal Daily   Chlorhexidine  Gluconate Cloth  6 each Topical Daily   gabapentin   200 mg Oral QHS   insulin  aspart  0-15 Units Subcutaneous TID WC   insulin  aspart  0-5 Units Subcutaneous QHS   insulin  glargine-yfgn  20 Units Subcutaneous Daily   melatonin  3 mg Oral QHS   mexiletine  200 mg Oral Q8H   pantoprazole   40 mg  Oral Daily   polyethylene glycol  17 g Oral Daily   potassium chloride   40 mEq Oral Q4H   senna  2 tablet Oral Daily   sildenafil   20 mg Oral TID   sodium chloride  flush  10-40 mL Intracatheter Q12H   sodium chloride  flush  10-40 mL Intracatheter Q12H   sodium chloride  flush  3 mL Intravenous Q12H   spironolactone   25 mg Oral Daily   Warfarin - Pharmacist Dosing Inpatient   Does not apply q1600    Infusions:  amiodarone  60 mg/hr (10/20/24 0600)   epinephrine  3 mcg/min (10/20/24 0600)   furosemide  (LASIX ) 200 mg in dextrose  5 % 100 mL (2 mg/mL) infusion 20 mg/hr (10/20/24 0600)   magnesium  sulfate bolus IVPB     milrinone  0.25 mcg/kg/min (10/20/24 0600)   potassium chloride       PRN Medications: dextrose , guaiFENesin -dextromethorphan , ondansetron  (ZOFRAN ) IV, mouth rinse, oxyCODONE , phenol, sodium chloride  flush, sodium chloride  flush, sodium chloride  flush, traMADol , traZODone    Assessment:   POD 6 HM 3 LVAD and removal of Impella 5.5 for Stage D NICM, LVEF < 20% with NYHA class IV symptoms on milrinone  at home. Preop right axillary Impella 5.5 for optimization due to low output and Co-ox on milrinone  alone.   Moderate to severe pulmonary HTN preop improved with Impella and diuresis. Much better postop after LVAD.    Preop AKI on stage 3a CKD due to low output. Improved with Impella and milrinone . Creat continues to come down despite massive diuresis.   Hx of  PVC's and NSVT. Has Biotronik ICD with therapies still off periop.   PAF, maintaining NSR preop on IV amio but back in AF postop. Hopefully amio will be more effective once inotrope levels lower and K+ normalized.   Preop thrombocytopenia, HIT negative. Improved postop with plt transfusion. Resolved postop and plts back to normal   Expected acute postop blood loss anemia. Stable.  Plan/Discussion:    Hemodynamics stable on current regimen with normal LVAD function. Co-ox improving with volume removal. Inotropes per AHF team.   AF with RVR. Continue IV amio, replace K+  Continue Coumadin  per pharmacy. INR rising now.   DC swan and sleeve. PICC is in. Foley removed.   IS, PT, OT. Potentially CIR candidate.   I reviewed the LVAD parameters from today, and compared the results to the patient's prior recorded data.  No programming changes were made.  The LVAD is functioning within specified parameters.  LVAD interrogation was negative for any significant power changes, alarms or PI events/speed drops.       Length of Stay: [address]  Dorise POUR [facility] 10/20/2024, 7:08 AM

## 2024-10-20 NOTE — Progress Notes (Signed)
 "  NAME:  James [name], MRN:  [id number], DOB:  Sep 07, 1961, LOS: 19 ADMISSION DATE:  10/01/2024, CONSULTATION DATE:  10/14/24 REFERRING MD:  Lucas , CHIEF COMPLAINT:  s/p LVAD   History of Present Illness:  64 yo M with stg D cardiomyopathy + inotrope dependence, CKD 3a, hx VT, pAFib, meningioma who was admitted to HF service 10/01/24 for pre-VAD optimization  This included: RHC, milrinone  titration, impella placement, levo and DBA titration and volume optimization  On 10/14/24 he went for LVAD  Pump: 116 min  EBL: 750 Product: 2 plt 4 ffp 708 cellsaver  PCCM is consulted post op ICU management.  Pertinent  Medical History  HFrEF Inotrope dependence VT pAFib  Prostate cancer Meningioma  HTN DM2   Significant Hospital Events: Including procedures, antibiotic start and stop dates in addition to other pertinent events    10/01/24 RHC w low output and pHTN, and subsequent uptitration of milrinone  from 0.25 to 0.5 10/02/24 started ND 10/05/24 high PA pressures, hignPCWP high SVR. 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 Hep to bival 10/13/24 not likely HIT. Milrinone  to 0.375  10/14/24 LVAD. PCCM consult.  10/26/23 extubated, nitric off, 1u prbc, lasix   Interim History / Subjective:  Patient is doing well.  Pacing wires and [name] is out.  LVAD RPMs increasing.  Hemoglobin stable at 8.7.  Creatinine is slightly up.  Objective    Blood pressure (!) 82/69, pulse (!) 132, temperature 99.1 F (37.3 C), resp. rate 20, height 6' 6 (1.981 m), weight 101.4 kg, SpO2 98%. PAP: (32-49)/(12-31) 39/28 CVP:  [5 mmHg-18 mmHg] 18 mmHg CO:  [5.5 L/min] 5.5 L/min CI:  [2.33 L/min/m2] 2.33 L/min/m2      Intake/Output Summary (Last 24 hours) at 10/20/2024 1526 Last data filed at 10/20/2024 1453 Gross per 24 hour  Intake 2164.68 ml  Output 6490 ml  Net -4325.32  ml   Filed Weights   10/18/24 0500 10/19/24 0619 10/20/24 0600  Weight: 110.4 kg 106.5 kg 101.4 kg    Examination: Physical exam: General: Chronically ill-appearing male, lying on the bed HEENT: [place]/AT, eyes anicteric.  moist mucus membranes Neuro: Alert, awake following commands Chest: Coarse breath sounds, no wheezes or rhonchi Heart: Regular rate and rhythm, no murmurs or gallops. LVAD hum noted Abdomen: Soft, nontender, nondistended, bowel sounds present   LVAD  Speed 5400 Flow 5.2 L,PMpower 3.6, PI 3.0 Milrinone  0.25 Epi 2 Amio 60   coox 55.7 %  Resolved problem list   Assessment and Plan   Acute on chronic HFrEF, stage D cardiomyopathy s/p LVAD (HM3 10/14/24)  pHTN, acute on chronic RV failure   PVCs, NSVT  pAFib  -post-op care per TCTS - Drains out. - coumadin  this evening per pharmacy  - Continue milrinone , epinephrine  and Amio drips as above. -On Aldactone .  Lasix  held. - con't mexiletine 200mg  q8h  - mobility as able   Acute hypoxic respiratory failure 2/2 pulmonary edema, deconditioning  - O2 wean for sat >92%  - mobility -> oob if tolerates  - incentive spirometry   LUE swelling improving - Monitor  Leukocytosis resolved  - trend fever, wbc curves   CKD 3a Hyponatremia  Nephro labs stable.  Hyponatremia ongoing 130>128, may be now oral water  intake contributing in addition to his HF pathophys  - monitor  - drink solutes in addition to water  I.e. gatorade etc.  - renal perfusion,  renally dose meds, elyte replacement, strict I/O  DM2 w hyperglycemia  - ssi resistant scale  -  lantus  20 units daily  - cbg q4h   Anemia; expected operative blood loss.  Thrombocytopenia; not HIT - trend  - transfuse prn   Labs   CBC: Recent Labs  Lab 10/16/24 0414 10/16/24 0451 10/17/24 0442 10/18/24 0514 10/18/24 1746 10/19/24 0411 10/19/24 0450 10/20/24 0426 10/20/24 0454  WBC 21.5*  --  18.0* 13.7* 12.7* 11.6*  --  9.8  --   NEUTROABS 19.3*   --  15.9* 11.9*  --  9.9*  --  8.4*  --   HGB 8.6*   < > 8.0* 8.0* 8.3* 8.5* 8.8* 8.7* 9.2*  HCT 25.4*   < > 23.6* 23.4* 26.4* 24.4* 26.0* 25.7* 27.0*  MCV 92.4  --  91.8 91.8 97.8 90.0  --  91.5  --   PLT 124*  --  143* 187 203 233  --  205  --    < > = values in this interval not displayed.    Basic Metabolic Panel: Recent Labs  Lab 10/16/24 0414 10/16/24 0451 10/17/24 0442 10/17/24 2030 10/18/24 9485 10/18/24 1617 10/19/24 0411 10/19/24 0450 10/19/24 1646 10/20/24 0426 10/20/24 0454  NA 128*   < > 127* 125* 128* 126* 129* 128* 130* 130* 130*  K 4.0   < > 3.7 3.6 3.2* 3.5 3.0* 2.7* 3.2* 3.2* 3.0*  CL 94*   < > 92* 90* 91* 89* 88*  --  89* 87*  --   CO2 22   < > 23 24 25 25 28   --  29 32  --   GLUCOSE 154*   < > 129* 153* 100* 162* 172*  --  176* 220*  --   BUN 24*   < > 35* 38* 35* 39* 36*  --  37* 39*  --   CREATININE 1.62*   < > 1.81* 1.72* 1.56* 1.59* 1.54*  --  1.59* 1.67*  --   CALCIUM  9.0   < > 8.7* 8.6* 8.6* 8.8* 8.7*  --  8.8* 8.8*  --   MG 2.0  --  1.8 1.9 2.2 2.2 2.0  --   --  2.0  --   PHOS 4.4  --  3.3  --  2.3*  --  2.4*  --   --  2.5  --    < > = values in this interval not displayed.   GFR: Estimated Creatinine Clearance: 58.5 mL/min (A) (by C-G formula based on SCr of 1.67 mg/dL (H)). Recent Labs  Lab 10/16/24 1706 10/17/24 0442 10/18/24 0514 10/18/24 1746 10/19/24 0411 10/20/24 0426  WBC  --    < > 13.7* 12.7* 11.6* 9.8  LATICACIDVEN 1.6  --   --   --   --   --    < > = values in this interval not displayed.    Liver Function Tests: Recent Labs  Lab 10/15/24 0400 10/16/24 0414 10/17/24 0442  AST 82* 102* 56*  ALT 16 15 <5  ALKPHOS 90 87 90  BILITOT 1.3* 1.2 0.6  PROT 5.3* 5.9* 5.7*  ALBUMIN  3.4* 3.6 3.3*     ABG    Component Value Date/Time   PHART 7.538 (H) 10/20/2024 0454   PCO2ART 36.9 10/20/2024 0454   PO2ART 78 (L) 10/20/2024 0454   HCO3 31.4 (H) 10/20/2024 0454   TCO2 32 10/20/2024 0454   ACIDBASEDEF 1.0 10/17/2024 0438    O2SAT  55.7 10/20/2024 0530     Critical care time: 39 MINUTES     Tamela Stakes, MD  Attending Physician, Critical Care Medicine [facility] See Amion for pager If no response to pager, please call [phone] until 7pm After 7pm, Please call E-link [phone]     "

## 2024-10-20 NOTE — Progress Notes (Addendum)
 "    Advanced Heart Failure Rounding Note   [facility] Cardiologist: Dr. Zenaida  Patient Profile   Greg Cratty is a 64 y.o. male with history of stage D cardiomyopathy/chronic systolic heart failure now end-stage w/ inotrope dependence, CKD IIIa, hx VT, PAF, prostate cancer, meningioma.   Admitted for LVAD implant/ pre-VAD HF optimization.   Significant events:   12/18: Milrinone  titrated from 0.25 to 0.5 mcg/kg/min post RHC d/t low-output and pulmonary hypertension 12/19: NE added 12/22: Markedly elevated PA pressures, elevated PCWP and elevated SVR. Titrated off NE. Added DBA.  12/23: Impella 5.5 Implanted.  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 Heparin  > Bival.  12/31: HM III LVAD implant 1/5: LVAD speed increased to 5400   Subjective:    POD # 6  Remains on Epi 3 + Milrinone  0.25. Co-ox 56%, CI 2.3    CVP 8 PAP 33/17 PAPi 2  7L in UOP yesterday. Wt trending down, now just 5 lb above pre-op wt. Scr 1.59>>1.67, K 3.2, Mg 2.0   Remains in Afib, 110s   Feels ok this morning. No complaints. Denies CP. No dyspnea. Appetite good.   LVAD INTERROGATION:  HeartMate IIl LVAD:  Flow 5.3 liters/min, speed 5400, power 3.8, PI 2.9. No PI events   MAP: 74   Objective:    Weight Range: 101.4 kg Body mass index is 25.83 kg/m.   Vital Signs:   Temp:  [98.2 F (36.8 C)-99.5 F (37.5 C)] 99.1 F (37.3 C) (01/06 0721) Pulse Rate:  [96-213] 132 (01/06 0721) Resp:  [9-31] 20 (01/06 0721) BP: (80-96)/(53-69) 82/69 (01/06 0400) SpO2:  [75 %-100 %] 98 % (01/06 0721) Arterial Line BP: (69-98)/(58-86) 89/80 (01/06 0721) Weight:  [101.4 kg] 101.4 kg (01/06 0600) Last BM Date : 10/18/24  Weight change: Filed Weights   10/18/24 0500 10/19/24 0619 10/20/24 0600  Weight: 110.4 kg 106.5 kg 101.4 kg   Intake/Output:  Intake/Output Summary (Last 24 hours) at 10/20/2024 0744 Last data filed at 10/20/2024 0700 Gross per 24 hour  Intake 2450.77 ml  Output 6915 ml  Net -4464.23 ml    Physical  Exam   GENERAL: no acute distress. HEENT: normal  NECK: Supple, JVP 8 cm.   CARDIAC:  Mechanical heart sounds with LVAD hum present.  LUNGS:  Clear to auscultation bilaterally.  ABDOMEN:  Soft, round, nontender, positive bowel sounds x4.     LVAD exit site:   Dressing dry and intact.  No erythema or drainage.  Stabilization device present and accurately applied.  EXTREMITIES:  Warm and dry, no cyanosis, clubbing or rash , 1-2+ b/l LE pretibial edema  NEUROLOGIC:  Alert and oriented x 4.  Gait steady.  No aphasia.  No dysarthria.  Affect pleasant.     Telemetry   Afib 110s, personally reviewed   Labs   CBC Recent Labs    10/19/24 0411 10/19/24 0450 10/20/24 0426 10/20/24 0454  WBC 11.6*  --  9.8  --   NEUTROABS 9.9*  --  8.4*  --   HGB 8.5*   < > 8.7* 9.2*  HCT 24.4*   < > 25.7* 27.0*  MCV 90.0  --  91.5  --   PLT 233  --  205  --    < > = values in this interval not displayed.   Basic Metabolic Panel Recent Labs    98/94/73 0411 10/19/24 0450 10/19/24 1646 10/20/24 0426 10/20/24 0454  NA 129*   < > 130* 130* 130*  K 3.0*   < >  3.2* 3.2* 3.0*  CL 88*  --  89* 87*  --   CO2 28  --  29 32  --   GLUCOSE 172*  --  176* 220*  --   BUN 36*  --  37* 39*  --   CREATININE 1.54*  --  1.59* 1.67*  --   CALCIUM  8.7*  --  8.8* 8.8*  --   MG 2.0  --   --  2.0  --   PHOS 2.4*  --   --  2.5  --    < > = values in this interval not displayed.   Liver Function Tests No results for input(s): AST, ALT, ALKPHOS, BILITOT, PROT, ALBUMIN  in the last 72 hours.   BNP (last 3 results) Recent Labs    06/10/24 1213 07/09/24 1649 07/23/24 0959  BNP 1,071.5* 3,316.0* 595.8*   ProBNP (last 3 results) Recent Labs    10/01/24 1030 10/15/24 0400  PROBNP 8,374.0* 9,508.0*   Medications:    Scheduled Medications:  bisacodyl   10 mg Oral Daily   Or   bisacodyl   10 mg Rectal Daily   Chlorhexidine  Gluconate Cloth  6 each Topical Daily   gabapentin   200 mg Oral QHS    insulin  aspart  0-15 Units Subcutaneous TID WC   insulin  aspart  0-5 Units Subcutaneous QHS   insulin  glargine-yfgn  20 Units Subcutaneous Daily   melatonin  3 mg Oral QHS   mexiletine  200 mg Oral Q8H   pantoprazole   40 mg Oral Daily   polyethylene glycol  17 g Oral Daily   potassium chloride   40 mEq Oral Q4H   senna  2 tablet Oral Daily   sildenafil   20 mg Oral TID   sodium chloride  flush  10-40 mL Intracatheter Q12H   sodium chloride  flush  10-40 mL Intracatheter Q12H   sodium chloride  flush  3 mL Intravenous Q12H   spironolactone   25 mg Oral Daily   Warfarin - Pharmacist Dosing Inpatient   Does not apply q1600    Infusions:  amiodarone  60 mg/hr (10/20/24 0700)   epinephrine  3 mcg/min (10/20/24 0700)   furosemide  (LASIX ) 200 mg in dextrose  5 % 100 mL (2 mg/mL) infusion 20 mg/hr (10/20/24 0700)   magnesium  sulfate bolus IVPB     milrinone  0.25 mcg/kg/min (10/20/24 0700)   potassium chloride       PRN Medications: dextrose , guaiFENesin -dextromethorphan , ondansetron  (ZOFRAN ) IV, mouth rinse, oxyCODONE , phenol, sodium chloride  flush, sodium chloride  flush, sodium chloride  flush, traMADol , traZODone   Assessment/Plan   Acute on chronic systolic CHF, NYHA IV: Nonischemic cardiomyopathy, planned LVAD. RHC 12/18 showed elevated filling pressures with mod-severe mixed pulmonary arterial/pulmonary venous hypertension and low CO despite milrinone  0.25, increased to 0.5. Impella 5.5 placed 12/23 for further optimization. - S/p HM III LVAD implant by Dr. Lucas 10/14/24 - iNO off 1/1 - Currently on 0.25 milrinone  + 3 Epi. Co-ox 56%, CI 2.3, PAPi 2.0  - Diuresing well, c/w mild volume overload. Transition back to scheduled IV Lasix , 80 mg bid - Place TED hoses  - Continue sildenafil  20mg  TID for RV support - Start Epi wean today. Continue Milrinone  at current rate   - Continue spironolactone  25mg  daily  Pulmonary hypertension: Severe combined pre/post capillary PH on RHC, suspect largely  due to venous remodeling. Improved with adequate unloading. - Good pulsatility, does not need further inotropic support - Continue IV Lasix  per above   AKI on CKD stage 3: Scr trending back up w/ diuresis. -  back down on diuretics today, transition to scheduled 80 IV Lasix  bid - follow BMP   PVCs/NSVT: Improved. Has Biotronik ICD. - Continue IV amio. - Continue mexiletine 200 TID  - Tachy therapies off for VAD implant. Turn back on prior to d/c   Paroxysmal Atrial fibrillation: In Afib this am, V-rates 110s - Continue amiodarone  gtt as above.  - aggressive K and Mg supp  - Warfarin per TCTS - if no chemical conversion, will need TEE/DCCV   Meningioma: Monitoring.   Prostate cancer: Treated with radiation.   Thrombocytopenia: suspect consumptive with impella.  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  Critical care was necessary to treat or prevent imminent or life-threatening deterioration.  Critical care was time spent personally by me on the following activities: development of treatment plan with patient and/or surrogate as well as nursing, discussions with consultants, evaluation of patient's response to treatment, examination of patient, obtaining history from patient or surrogate, ordering and performing treatments and interventions, ordering and review of laboratory studies, ordering and review of radiographic studies, pulse oximetry and re-evaluation of patient's condition.    Please visit [email or web address]: For overnight coverage please call cardiology fellow  first. If fellow not available call Shock/ECMO MD on call.  For ECMO / Mechanical Support (Impella, IABP, LVAD) issues call Shock / ECMO MD on call.   Caffie Shed, PA-C 10/20/2024  Patient seen with PA, I formulated the plan and agree with the above note.    Good diuresis yesterday, weight down 11 lbs.  I/Os net negative 4464 on Lasix  gtt 20 mg/hr + acetazolamide  + metolazone .  CVP < 5 on my read today, co-ox 56% with CI 2.33.    CHe remains on milrinone  0.25 + epinephrine  3.    He is in atrial fibrillation today, rate 110s on amiodarone  gtt 60 mg/hr and warfarin with INR 1.7.    General: Well appearing this am. NAD.  HEENT: Normal. Neck: Supple, JVP 7-8 cm. Carotids OK.  Cardiac:  Mechanical heart sounds with LVAD hum present.  Lungs:  CTAB, normal effort.  Abdomen:  NT, ND, no HSM. No bruits or masses. +BS  LVAD exit site: Well-healed and incorporated. Dressing dry and intact. No erythema or drainage. Stabilization device present and accurately applied. Driveline dressing changed daily per sterile technique. Extremities:  Warm and dry. No cyanosis, clubbing, rash. 1+ edema to knees.   Neuro:  Alert & oriented x 3. Cranial nerves grossly intact. Moves all 4 extremities w/o difficulty. Affect pleasant     Co-ox and thermo CI adequate today.  Good diuresis, CVP now < 5 on my read though still about 5 lbs above pre-op.  BUN/creatinine mildly higher.  - Can remove Swan, follow CVP/co-ox off PICC.  - Continue current milrinone  and start to slowly wean epinephrine , down to 2 this morning.  - Continue sildenafil  for RV support.  - Continue current LVAD speed.  Parameters stable.  - Stop Lasix  gtt.  Start Lasix  80 mg IV bid this  evening.  No metolazone  today.  Decrease acetazolamide  to once daily.    Continue amiodarone  gtt for AF.  On warfarin with INR trending up, 1.7 today.  Will need eventual DCCV if he does not convert back to NSR on his own.    Continue to mobilize.   CRITICAL  CARE Performed by: Ezra Shuck  Total critical care time: 40 minutes  Critical care time was exclusive of separately billable procedures and treating other patients.  Critical care was necessary to treat or prevent imminent or life-threatening deterioration.  Critical care was time spent personally by me on the following activities: development of treatment plan with patient and/or surrogate as well as nursing, discussions with consultants, evaluation of patient's response to treatment, examination of patient, obtaining history from patient or surrogate, ordering and performing treatments and interventions, ordering and review of laboratory studies, ordering and review of radiographic studies, pulse oximetry and re-evaluation of patient's condition.  Ezra Shuck 10/20/2024 8:55 AM  "

## 2024-10-20 NOTE — Progress Notes (Signed)
 Inpatient Rehab Admissions Coordinator:   CIR following, not yet medically ready for transfer.   Leita Kleine, MS, CCC-SLP Rehab Admissions Coordinator  [phone] (celll) [phone] (office)

## 2024-10-20 NOTE — Progress Notes (Signed)
 PHARMACY - ANTICOAGULATION CONSULT NOTE  Pharmacy Consult for warfarin Indication: LVAD HM3 + hx AFib  Allergies[1]  Patient Measurements: Height: 6' 6 (198.1 cm) Weight: 101.4 kg (223 lb 8.7 oz) IBW/kg (Calculated) : 91.4 HEPARIN  DW (KG): 106.5  Vital Signs: Temp: 99.1 F (37.3 C) (01/06 0721) BP: 82/69 (01/06 0400) Pulse Rate: 132 (01/06 0721)  Labs: Recent Labs    10/18/24 0514 10/18/24 1617 10/18/24 1746 10/19/24 0411 10/19/24 0450 10/19/24 1646 10/20/24 0426 10/20/24 0454  HGB 8.0*  --  8.3* 8.5* 8.8*  --  8.7* 9.2*  HCT 23.4*  --  26.4* 24.4* 26.0*  --  25.7* 27.0*  PLT 187  --  203 233  --   --  205  --   LABPROT 18.2*  --   --  19.6*  --   --  20.9*  --   INR 1.4*  --   --  1.6*  --   --  1.7*  --   CREATININE 1.56*   < >  --  1.54*  --  1.59* 1.67*  --    < > = values in this interval not displayed.    Estimated Creatinine Clearance: 58.5 mL/min (A) (by C-G formula based on SCr of 1.67 mg/dL (H)).   Medical History: Past Medical History:  Diagnosis Date   CKD (chronic kidney disease) stage 2, GFR 60-89 ml/min    COPD (chronic obstructive pulmonary disease) (HCC)    Diabetes mellitus type II, non insulin  dependent (HCC)    Dilated aortic root    Elevated PSA    Hypercholesteremia    Hypertension    NICM (nonischemic cardiomyopathy) (HCC)    NSVT (nonsustained ventricular tachycardia) (HCC)    Obstructive sleep apnea    Paroxysmal atrial fibrillation (HCC)    Pulmonary hypertension (HCC)    PVC's (premature ventricular contractions)    Tobacco abuse     Medications:  Scheduled:   Chlorhexidine  Gluconate Cloth  6 each Topical Daily   gabapentin   200 mg Oral QHS   insulin  aspart  0-15 Units Subcutaneous TID WC   insulin  aspart  0-5 Units Subcutaneous QHS   insulin  glargine  20 Units Subcutaneous Daily   melatonin  3 mg Oral QHS   mexiletine  200 mg Oral Q8H   pantoprazole   40 mg Oral Daily   polyethylene glycol  17 g Oral Daily   potassium  chloride  40 mEq Oral Q4H   sildenafil   20 mg Oral TID   sodium chloride  flush  10-40 mL Intracatheter Q12H   spironolactone   25 mg Oral Daily   Warfarin - Pharmacist Dosing Inpatient   Does not apply q1600    Assessment: 63 yom who underwent LVAD HM3 placement on 12/31. Was on apixaban  PTA for hx Afib > now with LVAD plan to change to warfarin.   INR 1.7 today is subtherapeutic but slowly trending up after 4 doses of warfarin.  Hgb 9 (improving), pltc 205. LDH 724 up.  Appetite and oral intake improving, eating majority of meals per patient.  RD starting Boosts TID today.   Remains on amiodarone  60 mg/h with RVR.   Goal of Therapy:  INR 2-2.5 Monitor platelets by anticoagulation protocol: Yes   Plan:  Warfarin 4 mg x1 PO   Monitor daily INR, CBC, and for s/sx of bleeding  F/u PO intake  Thank you for allowing pharmacy to participate in this patient's care,  Maurilio Fila, PharmD Clinical Pharmacist 10/20/2024  8:13 AM  Please check AMION for all [facility] Pharmacy phone numbers After 10:00 PM, call Main Pharmacy [phone]      [1] No Known Allergies

## 2024-10-20 NOTE — Inpatient Diabetes Management (Signed)
 Inpatient Diabetes Program Recommendations  AACE/ADA: New Consensus Statement on Inpatient Glycemic Control (2015)  Target Ranges:  Prepandial:   less than 140 mg/dL      Peak postprandial:   less than 180 mg/dL (1-2 hours)      Critically ill patients:  140 - 180 mg/dL   Lab Results  Component Value Date   GLUCAP 261 (H) 10/20/2024   HGBA1C 6.0 (H) 07/10/2024    Review of Glycemic Control  Latest Reference Range & Units 10/19/24 06:21 10/19/24 11:22 10/19/24 17:27 10/19/24 21:05 10/20/24 07:42  Glucose-Capillary 70 - 99 mg/dL 835 (H) 774 (H) 796 (H) 184 (H) 261 (H)   Diabetes history: DM 2 Outpatient Diabetes medications:  Metformin 500 mg bid Farxiga  10 mg daily Current orders for Inpatient glycemic control:  Novolog  0-15 units tid with meals and HS Lantus  20 units daily  Inpatient Diabetes Program Recommendations:    If appropriate, consider adding Novolog  3 units tid with meals (hold if patient eats less than 50% or NPO).   Thanks,  Randall Bullocks, RN, BC-ADM Inpatient Diabetes Coordinator Pager [phone]  (8a-5p)

## 2024-10-21 ENCOUNTER — Inpatient Hospital Stay (HOSPITAL_COMMUNITY)

## 2024-10-21 DIAGNOSIS — I480 Paroxysmal atrial fibrillation: Secondary | ICD-10-CM | POA: Diagnosis not present

## 2024-10-21 DIAGNOSIS — I472 Ventricular tachycardia, unspecified: Secondary | ICD-10-CM | POA: Diagnosis not present

## 2024-10-21 DIAGNOSIS — I4891 Unspecified atrial fibrillation: Secondary | ICD-10-CM | POA: Diagnosis not present

## 2024-10-21 DIAGNOSIS — I272 Pulmonary hypertension, unspecified: Secondary | ICD-10-CM | POA: Diagnosis not present

## 2024-10-21 DIAGNOSIS — I5023 Acute on chronic systolic (congestive) heart failure: Secondary | ICD-10-CM | POA: Diagnosis not present

## 2024-10-21 DIAGNOSIS — R000 Tachycardia, unspecified: Secondary | ICD-10-CM | POA: Diagnosis not present

## 2024-10-21 DIAGNOSIS — Z95811 Presence of heart assist device: Secondary | ICD-10-CM | POA: Diagnosis not present

## 2024-10-21 LAB — MAGNESIUM: Magnesium: 2.1 mg/dL (ref 1.7–2.4)

## 2024-10-21 LAB — CBC WITH DIFFERENTIAL/PLATELET
Abs Immature Granulocytes: 0.15 K/uL — ABNORMAL HIGH (ref 0.00–0.07)
Basophils Absolute: 0 K/uL (ref 0.0–0.1)
Basophils Relative: 0 %
Eosinophils Absolute: 0 K/uL (ref 0.0–0.5)
Eosinophils Relative: 0 %
HCT: 25.2 % — ABNORMAL LOW (ref 39.0–52.0)
Hemoglobin: 8.6 g/dL — ABNORMAL LOW (ref 13.0–17.0)
Immature Granulocytes: 1 %
Lymphocytes Relative: 4 %
Lymphs Abs: 0.5 K/uL — ABNORMAL LOW (ref 0.7–4.0)
MCH: 31.3 pg (ref 26.0–34.0)
MCHC: 34.1 g/dL (ref 30.0–36.0)
MCV: 91.6 fL (ref 80.0–100.0)
Monocytes Absolute: 0.8 K/uL (ref 0.1–1.0)
Monocytes Relative: 7 %
Neutro Abs: 9.6 K/uL — ABNORMAL HIGH (ref 1.7–7.7)
Neutrophils Relative %: 88 %
Platelets: 190 K/uL (ref 150–400)
RBC: 2.75 MIL/uL — ABNORMAL LOW (ref 4.22–5.81)
RDW: 18.2 % — ABNORMAL HIGH (ref 11.5–15.5)
WBC: 11.1 K/uL — ABNORMAL HIGH (ref 4.0–10.5)
nRBC: 0.6 % — ABNORMAL HIGH (ref 0.0–0.2)

## 2024-10-21 LAB — BASIC METABOLIC PANEL WITH GFR
Anion gap: 10 (ref 5–15)
BUN: 42 mg/dL — ABNORMAL HIGH (ref 8–23)
CO2: 31 mmol/L (ref 22–32)
Calcium: 8.3 mg/dL — ABNORMAL LOW (ref 8.9–10.3)
Chloride: 87 mmol/L — ABNORMAL LOW (ref 98–111)
Creatinine, Ser: 1.54 mg/dL — ABNORMAL HIGH (ref 0.61–1.24)
GFR, Estimated: 50 mL/min — ABNORMAL LOW
Glucose, Bld: 285 mg/dL — ABNORMAL HIGH (ref 70–99)
Potassium: 4.3 mmol/L (ref 3.5–5.1)
Sodium: 129 mmol/L — ABNORMAL LOW (ref 135–145)

## 2024-10-21 LAB — POCT I-STAT 7, (LYTES, BLD GAS, ICA,H+H)
Acid-Base Excess: 9 mmol/L — ABNORMAL HIGH (ref 0.0–2.0)
Bicarbonate: 32 mmol/L — ABNORMAL HIGH (ref 20.0–28.0)
Calcium, Ion: 1.1 mmol/L — ABNORMAL LOW (ref 1.15–1.40)
HCT: 27 % — ABNORMAL LOW (ref 39.0–52.0)
Hemoglobin: 9.2 g/dL — ABNORMAL LOW (ref 13.0–17.0)
O2 Saturation: 95 %
Patient temperature: 98.3
Potassium: 3.2 mmol/L — ABNORMAL LOW (ref 3.5–5.1)
Sodium: 130 mmol/L — ABNORMAL LOW (ref 135–145)
TCO2: 33 mmol/L — ABNORMAL HIGH (ref 22–32)
pCO2 arterial: 37.9 mmHg (ref 32–48)
pH, Arterial: 7.534 — ABNORMAL HIGH (ref 7.35–7.45)
pO2, Arterial: 67 mmHg — ABNORMAL LOW (ref 83–108)

## 2024-10-21 LAB — RENAL FUNCTION PANEL
Albumin: 3.2 g/dL — ABNORMAL LOW (ref 3.5–5.0)
Anion gap: 11 (ref 5–15)
BUN: 39 mg/dL — ABNORMAL HIGH (ref 8–23)
CO2: 32 mmol/L (ref 22–32)
Calcium: 8.8 mg/dL — ABNORMAL LOW (ref 8.9–10.3)
Chloride: 89 mmol/L — ABNORMAL LOW (ref 98–111)
Creatinine, Ser: 1.54 mg/dL — ABNORMAL HIGH (ref 0.61–1.24)
GFR, Estimated: 50 mL/min — ABNORMAL LOW
Glucose, Bld: 167 mg/dL — ABNORMAL HIGH (ref 70–99)
Phosphorus: 2.7 mg/dL (ref 2.5–4.6)
Potassium: 3.4 mmol/L — ABNORMAL LOW (ref 3.5–5.1)
Sodium: 131 mmol/L — ABNORMAL LOW (ref 135–145)

## 2024-10-21 LAB — GLUCOSE, CAPILLARY
Glucose-Capillary: 175 mg/dL — ABNORMAL HIGH (ref 70–99)
Glucose-Capillary: 254 mg/dL — ABNORMAL HIGH (ref 70–99)
Glucose-Capillary: 170 mg/dL — ABNORMAL HIGH (ref 70–99)
Glucose-Capillary: 101 mg/dL — ABNORMAL HIGH (ref 70–99)

## 2024-10-21 LAB — PROTIME-INR
INR: 1.7 — ABNORMAL HIGH (ref 0.8–1.2)
Prothrombin Time: 20.8 s — ABNORMAL HIGH (ref 11.4–15.2)

## 2024-10-21 LAB — COOXEMETRY PANEL
Carboxyhemoglobin: 1.5 % (ref 0.5–1.5)
Methemoglobin: <0.7 % (ref 0.0–1.5)
O2 Saturation: 69.7 %
Total hemoglobin: 8.1 g/dL — ABNORMAL LOW (ref 12.0–16.0)

## 2024-10-21 LAB — LACTATE DEHYDROGENASE: LDH: 405 U/L — ABNORMAL HIGH (ref 105–235)

## 2024-10-21 LAB — PRO BRAIN NATRIURETIC PEPTIDE: Pro Brain Natriuretic Peptide: 5277 pg/mL — ABNORMAL HIGH

## 2024-10-21 MED ORDER — ACETAZOLAMIDE SODIUM 500 MG IJ SOLR
500.0000 mg | Freq: Once | INTRAMUSCULAR | Status: AC
  Administered 2024-10-21: 500 mg via INTRAVENOUS
  Filled 2024-10-21: qty 500

## 2024-10-21 MED ORDER — POTASSIUM CHLORIDE 20 MEQ PO PACK
40.0000 meq | PACK | ORAL | Status: AC
  Administered 2024-10-21 (×2): 40 meq via ORAL
  Filled 2024-10-21 (×2): qty 2

## 2024-10-21 MED ORDER — WARFARIN SODIUM 2 MG PO TABS
4.0000 mg | ORAL_TABLET | Freq: Once | ORAL | Status: AC
  Administered 2024-10-21: 4 mg via ORAL
  Filled 2024-10-21: qty 2

## 2024-10-21 MED ORDER — STERILE WATER FOR INJECTION IJ SOLN
INTRAMUSCULAR | Status: AC
  Administered 2024-10-21: 10 mL
  Filled 2024-10-21: qty 10

## 2024-10-21 MED ORDER — POTASSIUM CHLORIDE 20 MEQ PO PACK
40.0000 meq | PACK | Freq: Once | ORAL | Status: AC
  Administered 2024-10-21: 40 meq via ORAL
  Filled 2024-10-21: qty 2

## 2024-10-21 MED ORDER — FUROSEMIDE 10 MG/ML IJ SOLN
INTRAMUSCULAR | Status: AC
  Administered 2024-10-21: 80 mg via INTRAVENOUS
  Filled 2024-10-21: qty 4

## 2024-10-21 MED ORDER — STERILE WATER FOR INJECTION IJ SOLN
INTRAMUSCULAR | Status: AC
  Administered 2024-10-21: 5 mL
  Filled 2024-10-21: qty 10

## 2024-10-21 MED ORDER — POTASSIUM CHLORIDE 10 MEQ/50ML IV SOLN
10.0000 meq | INTRAVENOUS | Status: AC
  Administered 2024-10-21 (×2): 10 meq via INTRAVENOUS
  Filled 2024-10-21 (×2): qty 50

## 2024-10-21 MED ORDER — POTASSIUM CHLORIDE CRYS ER 20 MEQ PO TBCR: 40.0000 meq | EXTENDED_RELEASE_TABLET | ORAL | Status: DC

## 2024-10-21 NOTE — Progress Notes (Addendum)
 Occupational Therapy Treatment Patient Details Name: James [name] MRN: [id number] DOB: Feb 21, 1961 Today's Date: 10/21/2024   History of present illness Pt is a 64 yo male who was admitted 10/01/24 for pre-VAD optimization prior to planned surgery. S/p R heart cath 12/18 & 12/24, impella placement 12/23, impella removal and HM III LVAD placement 12/31. Extubated 10/15/24. PMH: CKD IIIa, hx VT, PAF, prostate cancer, meningioma, COPD, DM2, HTN, hypercholesteremia, OSA, pulmonary hypertension, tobacco abuse.   OT comments  Goal of session was to begin ADL retraining EOB regarding sternal precautions and managmenet of LVAD equipment as pt refused to get to recliner. VSS initially EOB however pt continued to require tactile and verbal cues to maintain midline postural control - significant L lat lean. Required +2 Max A to transfer to chair in order to attempt to stand to return to bed. When standing pt less alert and slow to process information. Pt most likely orthostatic. Returned to bed with +2 Max A.  Level of arousal improved once supine. Nsg present and aware of change in status.  Pt did c/o SOB with activity however SpO2 >90. Continue to recommend intensive inpatient follow-up therapy, >3 hours/day to maximize functional level of independence. Acute OT to follow.  Speed 5400 Flow 5.3 Power 3.8 PI 2.5      If plan is discharge home, recommend the following:  A lot of help with walking and/or transfers;A lot of help with bathing/dressing/bathroom   Equipment Recommendations  BSC/3in1    Recommendations for Other Services Rehab consult    Precautions / Restrictions Precautions Precautions: Fall;Sternal;Other (comment) Precaution Booklet Issued: No Recall of Precautions/Restrictions: Impaired Precaution/Restrictions Comments: LVAD;  A-line Restrictions Weight Bearing Restrictions Per Provider Order: Yes RUE Weight Bearing Per Provider Order:  (sternal precautions) LUE Weight Bearing  Per Provider Order:  (sternal precautions) Other Position/Activity Restrictions: sternal precautions       Mobility Bed Mobility Overal bed mobility: Needs Assistance Bed Mobility: Rolling, Sidelying to Sit, Sit to Sidelying Rolling: Mod assist Sidelying to sit: Mod assist, HOB elevated     Sit to sidelying: Mod assist, +2 for physical assistance General bed mobility comments: pt able to come to his side holding heart pillow needs modAx2 for bringing trunk to upright and scooting hip to EOB, pt with R lateral lean in sitting able to correct with cuing    Transfers Overall transfer level: Needs assistance Equipment used: Ambulation equipment used Transfers: Sit to/from Stand, Bed to chair/wheelchair/BSC Sit to Stand: +2 physical assistance, Max assist (+RN for management of lines)     Step pivot transfers: Mod assist, +2 physical assistance     General transfer comment: cues to not reach for EVA walker; Max A +2 to stand from lower chair     Balance Overall balance assessment: Needs assistance Sitting-balance support: No upper extremity supported, Feet supported Sitting balance-Leahy Scale: Poor Sitting balance - Comments: L lateral lean, CGA-minA Postural control: Left lateral lean Standing balance support: Bilateral upper extremity supported, During functional activity Standing balance-Leahy Scale: Poor Standing balance comment: reliant on min -mod A                           ADL either performed or assessed with clinical judgement   ADL Overall ADL's : Needs assistance/impaired Eating/Feeding: Set up   Grooming: Minimal assistance   Upper Body Bathing: Moderate assistance   Lower Body Bathing: Maximal assistance;Sit to/from stand   Upper Body Dressing : Maximal assistance  Lower Body Dressing: Maximal assistance   Toilet Transfer: Moderate assistance;+2 for physical assistance   Toileting- Clothing Manipulation and Hygiene: Maximal assistance        Functional mobility during ADLs: Moderate assistance;+2 for physical assistance (to stand; eva walker); became Max A      Extremity/Trunk Assessment Upper Extremity Assessment Upper Extremity Assessment: Left hand dominant;Generalized weakness (LUE appears weaker than R)   Lower Extremity Assessment Lower Extremity Assessment: Defer to PT evaluation        Vision       Perception     Praxis     Communication Communication Communication: No apparent difficulties   Cognition Arousal: Alert, Lethargic Behavior During Therapy: Flat affect Cognition: Cognition impaired     Awareness: Online awareness impaired Memory impairment (select all impairments): Working civil service fast streamer, Programmer, systems, Engineer, structural memory Attention impairment (select first level of impairment): Sustained attention Executive functioning impairment (select all impairments): Problem solving, Reasoning, Organization OT - Cognition Comments: slower processing; will continue to assess                 Following commands: Impaired Following commands impaired: Follows one step commands with increased time, Follows multi-step commands with increased time      Cueing   Cueing Techniques: Verbal cues, Tactile cues, Gestural cues  Exercises Exercises: Other exercises    Shoulder Instructions       General Comments      Pertinent Vitals/ Pain       Pain Assessment Pain Assessment: No/denies pain  Home Living                                          Prior Functioning/Environment              Frequency  Min 2X/week        Progress Toward Goals  OT Goals(current goals can now be found in the care plan section)  Progress towards OT goals: Progressing toward goals  Acute Rehab OT Goals Patient Stated Goal: go to rehab OT Goal Formulation: With patient/family Time For Goal Achievement: 10/30/24 Potential to Achieve Goals: Good ADL Goals Pt Will Perform  Upper Body Bathing: sitting Pt Will Perform Lower Body Bathing: with min assist;sit to/from stand Pt Will Perform Upper Body Dressing: with min assist;sitting Pt Will Transfer to Toilet: with min assist;ambulating Pt Will Perform Toileting - Clothing Manipulation and hygiene: with min assist;sit to/from stand Additional ADL Goal #1:  (Pt will manage LVAD equipment during ADL tasks with min vc)  Plan      Co-evaluation    PT/OT/SLP Co-Evaluation/Treatment: Yes (partial session) Reason for Co-Treatment: Complexity of the patient's impairments (multi-system involvement);For patient/therapist safety;To address functional/ADL transfers   OT goals addressed during session: ADL's and self-care;Strengthening/ROM      AM-PAC OT 6 Clicks Daily Activity     Outcome Measure   Help from another person eating meals?: A Little Help from another person taking care of personal grooming?: A Lot Help from another person toileting, which includes using toliet, bedpan, or urinal?: A Lot Help from another person bathing (including washing, rinsing, drying)?: A Lot Help from another person to put on and taking off regular upper body clothing?: A Lot Help from another person to put on and taking off regular lower body clothing?: A Lot 6 Click Score: 13    End of Session    OT  Visit Diagnosis: Unsteadiness on feet (R26.81);Other abnormalities of gait and mobility (R26.89);Muscle weakness (generalized) (M62.81)   Activity Tolerance Treatment limited secondary to medical complications (Comment) (? decreased BP)   Patient Left in chair;with call bell/phone within reach;with chair alarm set;with family/visitor present;with nursing/sitter in room   Nurse Communication Mobility status        Time: 9143-8989 OT Time Calculation (min): 74 min  Charges: OT General Charges $OT Visit: 1 Visit OT Treatments $Self Care/Home Management : 38-52 mins  Kreg Sink, OT/L   Acute OT Clinical  Specialist Acute Rehabilitation Services Pager [phone] Office [phone]   [facility] 10/21/2024, 12:12 PM

## 2024-10-21 NOTE — Progress Notes (Signed)
 "  NAME:  James [name], MRN:  [id number], DOB:  10-Nov-1960, LOS: 20 ADMISSION DATE:  10/01/2024, CONSULTATION DATE:  10/14/24 REFERRING MD:  Lucas , CHIEF COMPLAINT:  s/p LVAD   History of Present Illness:  64 yo M with stg D cardiomyopathy + inotrope dependence, CKD 3a, hx VT, pAFib, meningioma who was admitted to HF service 10/01/24 for pre-VAD optimization  This included: RHC, milrinone  titration, impella placement, levo and DBA titration and volume optimization  On 10/14/24 he went for LVAD  Pump: 116 min  EBL: 750 Product: 2 plt 4 ffp 708 cellsaver  PCCM is consulted post op ICU management.  Pertinent  Medical History  HFrEF Inotrope dependence VT pAFib  Prostate cancer Meningioma  HTN DM2   Significant Hospital Events: Including procedures, antibiotic start and stop dates in addition to other pertinent events    10/01/24 RHC w low output and pHTN, and subsequent uptitration of milrinone  from 0.25 to 0.5 10/02/24 started ND 10/05/24 high PA pressures, hignPCWP high SVR. 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 Hep to bival 10/13/24 not likely HIT. Milrinone  to 0.375  10/14/24 LVAD. PCCM consult.  10/15/24 extubated, nitric off, 1u prbc, lasix  10/20/24: [name] is out  Interim History / Subjective:  Patient is doing well.  Continued on IV Lasix .  He is working with physical therapy today.  Hemodynamically stable TTE.  Epinephrine  is slowly being weaned down.  Continued on Amio and milrinone .  Objective    Blood pressure (!) 75/65, pulse (!) 139, temperature 98 F (36.7 C), temperature source Oral, resp. rate (!) 25, height 6' 6 (1.981 m), weight 108.7 kg, SpO2 90%. CVP:  [5 mmHg-29 mmHg] 9 mmHg      Intake/Output Summary (Last 24 hours) at 10/21/2024 1435 Last data filed at 10/21/2024 1400 Gross per 24 hour  Intake 1608.53 ml  Output 3550  ml  Net -1941.47 ml   Filed Weights   10/19/24 0619 10/20/24 0600 10/21/24 0630  Weight: 106.5 kg 101.4 kg 108.7 kg    Examination: Physical exam: General: Alert and oriented no distress. HEENT: [place]/AT, eyes anicteric.  moist mucus membranes Neuro: Alert, awake following commands Chest: Coarse breath sounds, no wheezes or rhonchi Heart: Regular rate and rhythm, no murmurs or gallops LVAD hum is noted. Abdomen: Soft, nontender, nondistended, bowel sounds present   LVAD  HeartMate IIl LVAD:  Flow 5.4 liters/min, speed 5400, power 3.8, PI 2.3. No PI events   Milrinone  0.25 Epi 1 Amio 60   coox 69.7 %  Resolved problem list   Assessment and Plan   Acute on chronic HFrEF, stage D cardiomyopathy s/p LVAD (HM3 10/14/24)  pHTN, acute on chronic RV failure   PVCs, NSVT  pAFib  -post-op care per TCTS - Drains out. - coumadin  per pharmacy  - Continue milrinone , epinephrine  and Amio drips as above. -On Aldactone .  Lasix  at 80 twice daily. - con't mexiletine 200mg  q8h  - mobility as able   Acute hypoxic respiratory failure 2/2 pulmonary edema, deconditioning  - O2 wean for sat >92%  - mobility -> oob if tolerates  - incentive spirometry   LUE swelling improving - Monitor  Leukocytosis resolved  - trend fever, wbc curves   CKD 3a Hyponatremia  Nephro labs stable.  Hyponatremia ongoing 130>128, may be now oral water  intake contributing in addition to his HF pathophys  - monitor  - drink  solutes in addition to water  I.e. gatorade etc.  - renal perfusion, renally dose meds, elyte replacement, strict I/O  DM2 w hyperglycemia  - ssi resistant scale  -  lantus  20 units daily  - cbg q4h   Anemia; expected operative blood loss.  Thrombocytopenia; not HIT - trend  - transfuse prn   Plans for CIR eventually.  Labs   CBC: Recent Labs  Lab 10/17/24 0442 10/18/24 0514 10/18/24 1746 10/19/24 0411 10/19/24 0450 10/20/24 0426 10/20/24 0454 10/21/24 0433  10/21/24 0447  WBC 18.0* 13.7* 12.7* 11.6*  --  9.8  --  11.1*  --   NEUTROABS 15.9* 11.9*  --  9.9*  --  8.4*  --  9.6*  --   HGB 8.0* 8.0* 8.3* 8.5* 8.8* 8.7* 9.2* 8.6* 9.2*  HCT 23.6* 23.4* 26.4* 24.4* 26.0* 25.7* 27.0* 25.2* 27.0*  MCV 91.8 91.8 97.8 90.0  --  91.5  --  91.6  --   PLT 143* 187 203 233  --  205  --  190  --     Basic Metabolic Panel: Recent Labs  Lab 10/17/24 0442 10/17/24 2030 10/18/24 0514 10/18/24 1617 10/19/24 0411 10/19/24 0450 10/19/24 1646 10/20/24 0426 10/20/24 0454 10/20/24 1907 10/21/24 0433 10/21/24 0447 10/21/24 1324  NA 127*   < > 128* 126* 129*   < > 130* 130* 130* 131* 131* 130* 129*  K 3.7   < > 3.2* 3.5 3.0*   < > 3.2* 3.2* 3.0* 3.3* 3.4* 3.2* 4.3  CL 92*   < > 91* 89* 88*  --  89* 87*  --  88* 89*  --  87*  CO2 23   < > 25 25 28   --  29 32  --  30 32  --  31  GLUCOSE 129*   < > 100* 162* 172*  --  176* 220*  --  144* 167*  --  285*  BUN 35*   < > 35* 39* 36*  --  37* 39*  --  41* 39*  --  42*  CREATININE 1.81*   < > 1.56* 1.59* 1.54*  --  1.59* 1.67*  --  1.60* 1.54*  --  1.54*  CALCIUM  8.7*   < > 8.6* 8.8* 8.7*  --  8.8* 8.8*  --  8.8* 8.8*  --  8.3*  MG 1.8   < > 2.2 2.2 2.0  --   --  2.0  --   --  2.1  --   --   PHOS 3.3  --  2.3*  --  2.4*  --   --  2.5  --   --  2.7  --   --    < > = values in this interval not displayed.   GFR: Estimated Creatinine Clearance: 63.5 mL/min (A) (by C-G formula based on SCr of 1.54 mg/dL (H)). Recent Labs  Lab 10/16/24 1706 10/17/24 0442 10/18/24 1746 10/19/24 0411 10/20/24 0426 10/21/24 0433  WBC  --    < > 12.7* 11.6* 9.8 11.1*  LATICACIDVEN 1.6  --   --   --   --   --    < > = values in this interval not displayed.    Liver Function Tests: Recent Labs  Lab 10/15/24 0400 10/16/24 0414 10/17/24 0442 10/21/24 0433  AST 82* 102* 56*  --   ALT 16 15 <5  --   ALKPHOS 90 87 90  --   BILITOT 1.3* 1.2 0.6  --  PROT 5.3* 5.9* 5.7*  --   ALBUMIN  3.4* 3.6 3.3* 3.2*     ABG     Component Value Date/Time   PHART 7.534 (H) 10/21/2024 0447   PCO2ART 37.9 10/21/2024 0447   PO2ART 67 (L) 10/21/2024 0447   HCO3 32.0 (H) 10/21/2024 0447   TCO2 33 (H) 10/21/2024 0447   ACIDBASEDEF 1.0 10/17/2024 0438   O2SAT 69.7 10/21/2024 0511     Critical care time: 75 MINUTES     Tamela Stakes, MD  Attending Physician, Critical Care Medicine  Pulmonary Critical Care See Amion for pager If no response to pager, please call [phone] until 7pm After 7pm, Please call E-link [phone]     "

## 2024-10-21 NOTE — Progress Notes (Signed)
 Was notified by RN of new LUE weakness. Pt also endorses subjective LUE weakness, limiting his ability to participate in PT today. He is w/ slightly decreased strength compared to contralateral side but no other frank deficits.   INR 1.7 today   D/w Dr. Rolan. Will obtain head CT for stroke r/o.  RN notified.   Caffie Shed, PA-C 10/21/2024

## 2024-10-21 NOTE — Plan of Care (Signed)
" °  Problem: Education: Goal: Understanding of CV disease, CV risk reduction, and recovery process will improve Outcome: Progressing   Problem: Education: Goal: Knowledge of General Education information will improve Description: Including pain rating scale, medication(s)/side effects and non-pharmacologic comfort measures Outcome: Progressing   Problem: Activity: Goal: Ability to return to baseline activity level will improve Outcome: Not Progressing   "

## 2024-10-21 NOTE — Progress Notes (Signed)
 Physical Therapy Treatment Patient Details Name: James [name] MRN: [id number] DOB: 1960-11-08 Today's Date: 10/21/2024   History of Present Illness Pt is a 64 yo male who was admitted 10/01/24 for pre-VAD optimization prior to planned surgery. S/p R heart cath 12/18 & 12/24, impella placement 12/23, impella removal and HM III LVAD placement 12/31. Extubated 10/15/24. PMH: CKD IIIa, hx VT, PAF, prostate cancer, meningioma, COPD, DM2, HTN, hypercholesteremia, OSA, pulmonary hypertension, tobacco abuse.    PT Comments  OT texted PT for assistance due to pt having increased difficulty with L sided weakness, sitting up in chair getting ready for ambulation. Pt needing assistance for switching to battery power, and requires mod-maxAx2 for coming to standing. Pt having increased difficulty with standing ultimately requiring totalA on L side and increased effort to get patient to fully upright to remove his UE from EVA arm supports. Pt needing total A for returning to wall power and modAx2 for pivoting back to bed. Difficult to get BP reading during session but possible orthostatic response to positional change. D/c plans remain appropriate. PT will continue to follow acutely    If plan is discharge home, recommend the following: Two people to help with walking and/or transfers;Two people to help with bathing/dressing/bathroom;Assistance with cooking/housework;Direct supervision/assist for medications management;Direct supervision/assist for financial management;Assist for transportation;Help with stairs or ramp for entrance;Supervision due to cognitive status   Can travel by private vehicle      Yes  Equipment Recommendations  Rolling walker (2 wheels);BSC/3in1;Wheelchair (measurements PT);Wheelchair cushion (measurements PT) (pending progress; may progress to needing only a rollator and 3in1)    Recommendations for Other Services Rehab consult     Precautions / Restrictions  Precautions Precautions: Fall;Sternal;Other (comment) Precaution Booklet Issued: No Recall of Precautions/Restrictions: Impaired Precaution/Restrictions Comments: LVAD;  A-line Restrictions Weight Bearing Restrictions Per Provider Order: Yes RUE Weight Bearing Per Provider Order:  (sternal precautions) LUE Weight Bearing Per Provider Order:  (sternal precautions) Other Position/Activity Restrictions: sternal precautions     Mobility  Bed Mobility Overal bed mobility: Needs Assistance Bed Mobility: Rolling, Sit to Sidelying Rolling: Mod assist       Sit to sidelying: Mod assist, +2 for physical assistance General bed mobility comments: requires modAx2 for bringing trunk down to bed surface and LE into bed    Transfers Overall transfer level: Needs assistance Equipment used: Ambulation equipment used Transfers: Sit to/from Stand, Bed to chair/wheelchair/BSC Sit to Stand: +2 physical assistance, Max assist (+RN for management of lines)   Step pivot transfers: Mod assist, +2 physical assistance       General transfer comment: cues to not reach for EVA walker; Max A +2 to stand from lower chair increased L lateral lean in standing at EVA walker requiring modAx2 pt with increasing L lateral lean eventually needing total A on L to maintain standing,  pt requires maximal cuing for upright posture, ultimately needs to return to seated .        Balance Overall balance assessment: Needs assistance Sitting-balance support: No upper extremity supported, Feet supported Sitting balance-Leahy Scale: Poor Sitting balance - Comments: L lateral lean, CGA-minA Postural control: Left lateral lean Standing balance support: Bilateral upper extremity supported, During functional activity Standing balance-Leahy Scale: Poor Standing balance comment: reliant on                            Communication Communication Communication: No apparent difficulties  Cognition Arousal:  Alert, Lethargic Behavior During Therapy:  Flat affect   PT - Cognitive impairments: Awareness, Initiation, Problem solving, Safety/Judgement                       PT - Cognition Comments: pt with poor awareness of level of assist needed to maintain standing, Following commands: Impaired Following commands impaired: Follows one step commands with increased time, Follows multi-step commands with increased time    Cueing Cueing Techniques: Verbal cues, Tactile cues, Gestural cues     General Comments General comments (skin integrity, edema, etc.): pt unable to assist in transition to LVAD battery power and back to wall power during session due to decreased command follow and awareness of session goals.      Pertinent Vitals/Pain Pain Assessment Pain Assessment: No/denies pain     PT Goals (current goals can now be found in the care plan section) Acute Rehab PT Goals PT Goal Formulation: With patient/family Time For Goal Achievement: 10/30/24 Potential to Achieve Goals: Good Progress towards PT goals: Progressing toward goals    Frequency    Min 3X/week           Co-evaluation PT/OT/SLP Co-Evaluation/Treatment: Yes Reason for Co-Treatment: Complexity of the patient's impairments (multi-system involvement);For patient/therapist safety;To address functional/ADL transfers PT goals addressed during session: Mobility/safety with mobility;Balance OT goals addressed during session: ADL's and self-care;Strengthening/ROM      AM-PAC PT 6 Clicks Mobility   Outcome Measure  Help needed turning from your back to your side while in a flat bed without using bedrails?: A Lot Help needed moving from lying on your back to sitting on the side of a flat bed without using bedrails?: A Lot Help needed moving to and from a bed to a chair (including a wheelchair)?: Total Help needed standing up from a chair using your arms (e.g., wheelchair or bedside chair)?: Total Help needed  to walk in hospital room?: Total Help needed climbing 3-5 steps with a railing? : Total 6 Click Score: 8    End of Session Equipment Utilized During Treatment: Gait belt Activity Tolerance: Patient tolerated treatment well Patient left: in bed;with call bell/phone within reach;with nursing/sitter in room Nurse Communication: Mobility status PT Visit Diagnosis: Unsteadiness on feet (R26.81);Other abnormalities of gait and mobility (R26.89);Muscle weakness (generalized) (M62.81);Difficulty in walking, not elsewhere classified (R26.2);Pain Pain - part of body:  (sternum)     Time: 9054-8981 PT Time Calculation (min) (ACUTE ONLY): 33 min  Charges:    $Therapeutic Activity: 8-22 mins PT General Charges $$ ACUTE PT VISIT: 1 Visit                     Charlea Nardo B. Fleeta Lapidus PT, DPT Acute Rehabilitation Services Please use secure chat or  Call Office [phone]    Almarie KATHEE Fleeta Fleet 10/21/2024, 1:09 PM

## 2024-10-21 NOTE — Consult Note (Signed)
 WOC Nurse Consult Note:  WOC consult performed remotely utilizing imaging and chart review  Reason for Consult:DTPI to sacrum/coccyx with blister noted  Wound type: deep tissue pressure injury to sacrum with raised blistering to lateral aspect of wound Pressure Injury POA: No Measurement: see nursing flow sheets Wound bed: deep purple maroon discoloration Drainage (amount, consistency, odor) see nursing flowsheets Periwound: intact Dressing procedure/placement/frequency:  Cleanse with NS, pat dry.  Apply Xeroform to wound bed and cover with Silicone foam dressing.  Change daily.    WOC Nurse team will follow with you and see patient within 10 days for wound assessments.  Please notify WOC nurses of any acute changes in the wounds or any new areas of concern  Thank you,  Doyal Polite, MSN, RN, [facility] WOC Team [phone] (Available Mon-Fri 0700-1500)

## 2024-10-21 NOTE — Progress Notes (Signed)
 Inpatient Rehab Admissions Coordinator:    CIR following, not ready yet, Will follow up once medically stable and assess rehab needs at that time.   Leita Kleine, MS, CCC-SLP Rehab Admissions Coordinator  [phone] (celll) [phone] (office)

## 2024-10-21 NOTE — Progress Notes (Signed)
 H&V Care Navigation CSW Progress Note  Outpatient HF CSW attempted to visit pt at bedside to check in- patient resting and no family in room.  Per VAD team no CSW needs at this time.  CSW will continue to follow during implant stay and assist as needed.  James HILARIO Leech, LCSW Clinical Social Worker [facility] Desk#: [phone] Cell#: [phone]

## 2024-10-21 NOTE — Progress Notes (Addendum)
 "    Advanced Heart Failure Rounding Note   [facility] Cardiologist: Dr. Zenaida  Patient Profile   James [name] is a 64 y.o. male with history of stage D cardiomyopathy/chronic systolic heart failure now end-stage w/ inotrope dependence, CKD IIIa, hx VT, PAF, prostate cancer, meningioma.   Admitted for LVAD implant/ pre-VAD HF optimization.   Significant events:   12/18: Milrinone  titrated from 0.25 to 0.5 mcg/kg/min post RHC d/t low-output and pulmonary hypertension 12/19: NE added 12/22: Markedly elevated PA pressures, elevated PCWP and elevated SVR. Titrated off NE. Added DBA.  12/23: Impella 5.5 Implanted.  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 Heparin  > Bival.  12/31: HM III LVAD implant 1/5: LVAD speed increased to 5400   Subjective:    POD # 7  On Epi 2 + Milrinone  0.25. Co-ox 70%.   3.7 L in UOP yesterday. CVP 12. Doubt wt is accurate, have asked staff to re-weigh   SCr trending back down 1.67>>1.54 K 3.4 Mg 2.1   Feels ok this morning. Sitting up in bed. Eating breakfast. No CP or dyspnea.    LVAD INTERROGATION:  HeartMate IIl LVAD:  Flow 5.4 liters/min, speed 5400, power 3.8, PI 2.3. No PI events   MAPs 70s-80s   Objective:    Weight Range: 108.7 kg Body mass index is 27.69 kg/m.   Vital Signs:   Temp:  [97.5 F (36.4 C)-99.1 F (37.3 C)] 98 F (36.7 C) (01/07 0400) Pulse Rate:  [109-189] 118 (01/07 0630) Resp:  [8-46] 9 (01/07 0630) BP: (83-100)/(66-78) 83/75 (01/07 0400) SpO2:  [70 %-100 %] 94 % (01/07 0630) Arterial Line BP: (69-111)/(55-97) 81/71 (01/07 0630) Weight:  [108.7 kg] 108.7 kg (01/07 0630) Last BM Date : 10/21/23  Weight change: Filed Weights   10/19/24 0619 10/20/24 0600 10/21/24 0630  Weight: 106.5 kg 101.4 kg 108.7 kg   Intake/Output:  Intake/Output Summary (Last 24 hours) at 10/21/2024 0742 Last data filed at 10/21/2024 0600 Gross per 24 hour  Intake 1735.58 ml  Output 3700 ml  Net -1964.42 ml    Physical Exam   GENERAL: no acute  distress. HEENT: normal  NECK: Supple, JVP 12 cm  CARDIAC:  Mechanical heart sounds with LVAD hum present.  LUNGS:  Clear to auscultation bilaterally.  ABDOMEN:  Soft, round, nontender, positive bowel sounds x4.     LVAD exit site:   Dressing dry and intact.  No erythema or drainage.  Stabilization device present and accurately applied.  EXTREMITIES:  Warm and dry, no cyanosis, clubbing or rash. 1-2+ b/l LE pitting pretibial edema  NEUROLOGIC:  Alert and oriented x 4.  Gait steady.  No aphasia.  No dysarthria.  Affect pleasant.     Telemetry   Afib 110s, run of NSVT yesterday personally reviewed   Labs   CBC Recent Labs    10/20/24 0426 10/20/24 0454 10/21/24 0433 10/21/24 0447  WBC 9.8  --  11.1*  --   NEUTROABS 8.4*  --  9.6*  --   HGB 8.7*   < > 8.6* 9.2*  HCT 25.7*   < > 25.2* 27.0*  MCV 91.5  --  91.6  --   PLT 205  --  190  --    < > = values in this interval not displayed.   Basic Metabolic Panel Recent Labs    98/93/73 0426 10/20/24 0454 10/20/24 1907 10/21/24 0433 10/21/24 0447  NA 130*   < > 131* 131* 130*  K 3.2*   < >  3.3* 3.4* 3.2*  CL 87*  --  88* 89*  --   CO2 32  --  30 32  --   GLUCOSE 220*  --  144* 167*  --   BUN 39*  --  41* 39*  --   CREATININE 1.67*  --  1.60* 1.54*  --   CALCIUM  8.8*  --  8.8* 8.8*  --   MG 2.0  --   --  2.1  --   PHOS 2.5  --   --  2.7  --    < > = values in this interval not displayed.   Liver Function Tests Recent Labs    10/21/24 0433  ALBUMIN  3.2*     BNP (last 3 results) Recent Labs    06/10/24 1213 07/09/24 1649 07/23/24 0959  BNP 1,071.5* 3,316.0* 595.8*   ProBNP (last 3 results) Recent Labs    10/01/24 1030 10/15/24 0400 10/21/24 0433  PROBNP 8,374.0* 9,508.0* 5,277.0*   Medications:    Scheduled Medications:  Chlorhexidine  Gluconate Cloth  6 each Topical Daily   furosemide   80 mg Intravenous BID   gabapentin   200 mg Oral QHS   insulin  aspart  0-15 Units Subcutaneous TID WC   insulin   aspart  0-5 Units Subcutaneous QHS   insulin  glargine-yfgn  20 Units Subcutaneous Daily   lactose free nutrition  237 mL Oral TID BM   melatonin  3 mg Oral QHS   mexiletine  200 mg Oral Q8H   pantoprazole   40 mg Oral Daily   potassium chloride   40 mEq Oral Q4H   sildenafil   20 mg Oral TID   sodium chloride  flush  10-40 mL Intracatheter Q12H   spironolactone   25 mg Oral Daily   Warfarin - Pharmacist Dosing Inpatient   Does not apply q1600    Infusions:  amiodarone  60 mg/hr (10/21/24 0600)   epinephrine  2 mcg/min (10/21/24 0600)   milrinone  0.25 mcg/kg/min (10/21/24 0600)    PRN Medications: dextrose , guaiFENesin -dextromethorphan , ondansetron  (ZOFRAN ) IV, mouth rinse, oxyCODONE , phenol, polyethylene glycol, senna, traMADol , traZODone   Assessment/Plan   Acute on chronic systolic CHF, NYHA IV: Nonischemic cardiomyopathy, planned LVAD. RHC 12/18 showed elevated filling pressures with mod-severe mixed pulmonary arterial/pulmonary venous hypertension and low CO despite milrinone  0.25, increased to 0.5. Impella 5.5 placed 12/23 for further optimization. - S/p HM III LVAD implant by Dr. Lucas 10/14/24 - iNO off 1/1 - Currently on 0.25 milrinone  + 2 Epi. Co-ox 70% - Diuresing but remains volume up, CVP 12 - Continue IV Lasix  80 mg bid + 500 mg of Diamox  - Wean Epi down to 1. Continue Milrinone  at 0.25 until fully diuresed - Place Unna boots  - Continue sildenafil  20mg  TID for RV support - Continue spironolactone  25mg  daily  Pulmonary hypertension: Severe combined pre/post capillary PH on RHC, suspect largely due to venous remodeling. Improved with adequate unloading. - Good pulsatility, does not need further inotropic support - Continue IV Lasix  per above   AKI on CKD stage 3: Scr stable w/ diuresis, 1.5 today  - follow BMP   PVCs/NSVT: Improved. Has Biotronik ICD. - Continue IV amio. - Continue mexiletine 200 TID  - Tachy therapies off for VAD implant. Turn back on prior to d/c    Paroxysmal Atrial fibrillation: In Afib this am, V-rates 110s - Continue amiodarone  gtt as above.  - aggressive K and Mg supp  - Warfarin per TCTS - if no chemical conversion, will need TEE/DCCV   Meningioma: Monitoring.  Prostate cancer: Treated with radiation.   Thrombocytopenia: suspect consumptive with impella.  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 CIR team following.    CRITICAL CARE Performed by: Caffie Shed   Total critical care time: 15 minutes  Critical care time was exclusive of separately billable procedures and treating other patients.  Critical care was necessary to treat or prevent imminent or life-threatening deterioration.  Critical care was time spent personally by me on the following activities: development of treatment plan with patient and/or surrogate as well as nursing, discussions with consultants, evaluation of patient's response to treatment, examination of patient, obtaining history from patient or surrogate, ordering and performing treatments and interventions, ordering and review of laboratory studies, ordering and review of radiographic studies, pulse oximetry and re-evaluation of patient's condition.    Please visit [email or web address]: For overnight coverage please call cardiology fellow first. If fellow not available call Shock/ECMO MD on call.  For ECMO / Mechanical Support (Impella, IABP, LVAD) issues call Shock / ECMO MD on call.   Caffie Shed, PA-C 10/21/2024  Patient seen with PA, I formulated the plan and agree with the above note.   I/Os net negative 1920 with co-ox 70% on epinephrine  1 and milrinone  0.25.  Creatinine stable 1.54. CVP still elevated at 13.   Working with PT, has been weak.   General: Well appearing this am. NAD.  HEENT: Normal. Neck:  Supple, JVP 12-13 cm. Carotids OK.  Cardiac:  Mechanical heart sounds with LVAD hum present.  Lungs:  CTAB, normal effort.  Abdomen:  NT, ND, no HSM. No bruits or masses. +BS  LVAD exit site: Well-healed and incorporated. Dressing dry and intact. No erythema or drainage. Stabilization device present and accurately applied. Driveline dressing changed daily per sterile technique. Extremities:  Warm and dry. No cyanosis, clubbing, rash. 1+ edema to knees.  Neuro:  Alert & oriented x 3. Cranial nerves grossly intact. Moves all 4 extremities w/o difficulty. Affect pleasant    Co-ox adequate today at 70%.  Good diuresis again, CVP 12-13 today.  BUN/creatinine stable.   - Continue current milrinone  and start to slowly wean epinephrine , down to 1 this morning.  - Continue sildenafil  for RV support.  - Continue current LVAD speed.  Parameters stable.  - Lasix  80 mg IV bid and will give acetazolamide  500 bid today.    Continue amiodarone  gtt for AF.  On warfarin with INR trending up, 1.7 today.  Will need eventual DCCV if he does not convert back to NSR on his own.    Continue to mobilize, he is very weak.   CRITICAL CARE Performed by: Ezra Shuck  Total critical care time: 40 minutes  Critical care time was exclusive of separately billable procedures and treating other patients.  Critical care was necessary to treat or prevent imminent or life-threatening deterioration.  Critical care was time spent personally by me on the following activities: development of treatment plan with patient and/or surrogate as well as nursing, discussions with consultants, evaluation of patient's response to treatment, examination of patient, obtaining history from patient or surrogate, ordering and performing treatments and interventions, ordering and review of laboratory studies, ordering and review of radiographic studies, pulse oximetry and re-evaluation of patient's condition.  Ezra Shuck 10/21/2024  10:20  AM  "

## 2024-10-21 NOTE — Inpatient Diabetes Management (Signed)
 Inpatient Diabetes Program Recommendations  AACE/ADA: New Consensus Statement on Inpatient Glycemic Control (2015)  Target Ranges:  Prepandial:   less than 140 mg/dL      Peak postprandial:   less than 180 mg/dL (1-2 hours)      Critically ill patients:  140 - 180 mg/dL   Lab Results  Component Value Date   GLUCAP 254 (H) 10/21/2024   HGBA1C 6.0 (H) 07/10/2024    Review of Glycemic Control  Latest Reference Range & Units 10/20/24 21:13 10/21/24 06:33 10/21/24 11:53  Glucose-Capillary 70 - 99 mg/dL 767 (H) 824 (H) 745 (H)   Diabetes history: DM 2 Outpatient Diabetes medications:  Farxiga  10 mg daily Metformin 500 mg bid Current orders for Inpatient glycemic control:  Semglee  20 units daily Novolog  0-15 units tid with meals  Inpatient Diabetes Program Recommendations:    May consider increasing Semglee  to 25 units daily.  Also if lunch (1200) and dinner (1700) CBG's remain greater than goal, may benefit from Novolog   3 units of meal coverage (hold if patient eats less than 50% or NPO).   Thanks,  Randall Bullocks, RN, BC-ADM Inpatient Diabetes Coordinator Pager [phone]  (8a-5p)

## 2024-10-21 NOTE — Progress Notes (Signed)
 Orthopedic Tech Progress Note Patient Details:  James [name] Oct 08, 1961 [id number]  Attempted to apply UNNA BOOTS to patient, but at this time patient is working with PT/OT. Will attempt again later   Patient ID: James [name], male   DOB: 01/20/1961, 64 y.o.   MRN: [id number]  [name] Pac 10/21/2024, 10:05 AM

## 2024-10-21 NOTE — Progress Notes (Signed)
 LVAD Coordinator Rounding Note:  Admitted 10/01/24 for optimization prior to LVAD implant. Impella 5.5 placed 10/06/24.  HM 3 LVAD implanted on 10/14/24 by Dr Lucas under destination therapy criteria.  12/18: Milrinone  titrated from 0.25 to 0.5 mcg/kg/min post RHC d/t low-output and pulmonary hypertension 12/19: NE added 12/22: Markedly elevated PA pressures, elevated PCWP and elevated SVR. Titrated off NE. Added DBA.  12/23: Impella 5.5 Implanted.  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 Heparin  > Bival.  12/31: HM III LVAD implant 1/5: LVAD speed increased to 5400   Pt lying in bed on arrival. Denies complaints other than left hand weakness.  CT head ordered this afternoon..   Vital signs: Temp:  HR: 100 Doppler Pressure: none documented Arterial BP: 80/62 (73) Automatic AE:[id number] documented O2 Sat: 96% on RA Wt: 235.4>244.3>243.4>234.8>223.5>239.6 lb    LVAD interrogation reveals:  Speed: 5400 Flow: 5.5 Power: 3.8 w PI: 2.2   Alarms: none Events:  none Hematocrit: 25  Fixed speed: 5400 Low speed limit: 5100   Drive Line: Existing VAD dressing removed and site care performed using sterile technique. Drive line exit site cleaned with Chlora prep applicators x 2, allowed to dry, and gauze dressing with Silverlon patch applied. Exit site unincorporated, the velour is fully implanted at exit site. Moderate amount of serosanguinous drainage present on previous dressing. No redness, tenderness, foul odor or rash noted. Cath grip anchor secure. Continue daily dressing changes by VAD coordinator or nurse champion. Next dressing change due 10/22/24.       Labs:  LDH trend: 703>615>541>598>724>405  INR trend: 1.3>1.4>1.4>1.6>1.7>1.7  Anticoagulation Plan: - INR Goal: 2.0 - 2.5 - Coumadin  dosing per pharmacy  Blood Products:  Intra-op 10/14/24:   4 FFP  2 platelets  700 cellsaver  DDAVP  x 2 Post-op:  10/15/24: 1 PRBC  Device: - Biotronik -Therapies: OFF  Arrythmias: PVCs/NSVT-  currently on Amiodarone  drip. On Mexiletine 200 mg TID.  Respiratory: extubated 10/15/24  Infection:   Renal:  - BUN/CRT: 42/1.54  Drips:  Epinephrine  1 mcg/min Milrinone  0.25 mcg/kg/min Amiodarone  60 mg/hr  Adverse Events on VAD:  Patient Education:  Pt perform self test today independently Equipment and back up bag reviewed today during physical therapy  Plan/Recommendations:  1. Page VAD coordinator for any equipment or drive line concerns 2. Daily drive line dressing changes by VAD coordinator or nurse champion  Schuyler Lunger RN, BSN VAD Coordinator 24/7 Pager [phone]

## 2024-10-21 NOTE — Plan of Care (Signed)
" °  Problem: Education: Goal: Understanding of CV disease, CV risk reduction, and recovery process will improve Outcome: Progressing   Problem: Cardiovascular: Goal: Ability to achieve and maintain adequate cardiovascular perfusion will improve Outcome: Progressing   Problem: Elimination: Goal: Will not experience complications related to bowel motility Outcome: Progressing   Problem: Pain Managment: Goal: General experience of comfort will improve and/or be controlled Outcome: Progressing   Problem: Fluid Volume: Goal: Ability to maintain a balanced intake and output will improve Outcome: Progressing   Problem: Metabolic: Goal: Ability to maintain appropriate glucose levels will improve Outcome: Progressing   Problem: Skin Integrity: Goal: Risk for impaired skin integrity will decrease Outcome: Progressing   Problem: Tissue Perfusion: Goal: Adequacy of tissue perfusion will improve Outcome: Progressing   Problem: Cardiac: Goal: Ability to achieve and maintain adequate cardiopulmonary perfusion will improve Outcome: Progressing   "

## 2024-10-21 NOTE — Progress Notes (Signed)
 PHARMACY - ANTICOAGULATION CONSULT NOTE  Pharmacy Consult for warfarin Indication: LVAD HM3 + hx AFib  Allergies[1]  Patient Measurements: Height: 6' 6 (198.1 cm) Weight: 108.7 kg (239 lb 10.2 oz) (weighed with VAD hanging on neck) IBW/kg (Calculated) : 91.4 HEPARIN  DW (KG): 108.7  Vital Signs: Temp: 98 F (36.7 C) (01/07 0400) Temp Source: Oral (01/07 0400) BP: 75/65 (01/07 1130) Pulse Rate: 139 (01/07 0645)  Labs: Recent Labs    10/19/24 0411 10/19/24 0450 10/20/24 0426 10/20/24 0454 10/20/24 1907 10/21/24 0433 10/21/24 0447 10/21/24 1324  HGB 8.5*   < > 8.7* 9.2*  --  8.6* 9.2*  --   HCT 24.4*   < > 25.7* 27.0*  --  25.2* 27.0*  --   PLT 233  --  205  --   --  190  --   --   LABPROT 19.6*  --  20.9*  --   --  20.8*  --   --   INR 1.6*  --  1.7*  --   --  1.7*  --   --   CREATININE 1.54*   < > 1.67*  --  1.60* 1.54*  --  1.54*   < > = values in this interval not displayed.    Estimated Creatinine Clearance: 63.5 mL/min (A) (by C-G formula based on SCr of 1.54 mg/dL (H)).   Medical History: Past Medical History:  Diagnosis Date   CKD (chronic kidney disease) stage 2, GFR 60-89 ml/min    COPD (chronic obstructive pulmonary disease) (HCC)    Diabetes mellitus type II, non insulin  dependent (HCC)    Dilated aortic root    Elevated PSA    Hypercholesteremia    Hypertension    NICM (nonischemic cardiomyopathy) (HCC)    NSVT (nonsustained ventricular tachycardia) (HCC)    Obstructive sleep apnea    Paroxysmal atrial fibrillation (HCC)    Pulmonary hypertension (HCC)    PVC's (premature ventricular contractions)    Tobacco abuse     Medications:  Scheduled:   Chlorhexidine  Gluconate Cloth  6 each Topical Daily   furosemide   80 mg Intravenous BID   gabapentin   200 mg Oral QHS   insulin  aspart  0-15 Units Subcutaneous TID WC   insulin  aspart  0-5 Units Subcutaneous QHS   insulin  glargine-yfgn  20 Units Subcutaneous Daily   lactose free nutrition  237 mL  Oral TID BM   melatonin  3 mg Oral QHS   mexiletine  200 mg Oral Q8H   pantoprazole   40 mg Oral Daily   sildenafil   20 mg Oral TID   sodium chloride  flush  10-40 mL Intracatheter Q12H   spironolactone   25 mg Oral Daily   Warfarin - Pharmacist Dosing Inpatient   Does not apply q1600    Assessment: 63 yom who underwent LVAD HM3 placement on 12/31. Was on apixaban  PTA for hx Afib > now with LVAD plan to change to warfarin.   INR 1.7 today remains subtherapeutic.  Hgb 9.2 (improving), pltc 190. LDH 405 down.  Still good PO intake/appetite.  Getting Ensures TID (1/6>c).   Remains on amiodarone  60 mg/h with RVR.   Goal of Therapy:  INR 2-2.5 Monitor platelets by anticoagulation protocol: Yes   Plan:  Warfarin 4 mg x1 PO   Monitor daily INR, CBC, and for s/sx of bleeding  F/u PO intake  Thank you for allowing pharmacy to participate in this patient's care,  Maurilio Fila, PharmD Clinical Pharmacist 10/21/2024  2:49  PM  Please check AMION for all [facility] phone numbers After 10:00 PM, call Main Pharmacy [phone]       [1] No Known Allergies

## 2024-10-21 NOTE — Progress Notes (Signed)
 Orthopedic Tech Progress Note Patient Details:  James [name] 1961/09/09 [id number]  Ortho Devices Type of Ortho Device: Nonie boot Ortho Device/Splint Location: BLE Ortho Device/Splint Interventions: Ordered, Application, Adjustment   Post Interventions Patient Tolerated: Well Instructions Provided: Care of device, Adjustment of device  Adine MARLA Blush 10/21/2024, 6:07 PM

## 2024-10-21 NOTE — TOC Progression Note (Signed)
 Transition of Care [facility]) - Progression Note    Patient Details  Name: James [name] MRN: [id number] Date of Birth: [birth year]  [facility] CM/SW Contact  [name] Phone Number: [phone] 10/21/2024, 2:43 PM  Clinical Narrative:   HF CSW reviewed patients chart for discharge readiness, patient not medically stable for d/c. CIR following. ICM will continue to follow.   HF CSW/CM will continue to follow and monitor for dc readiness.       Expected Discharge Plan: IP Rehab Facility Barriers to Discharge: Continued Medical Work up               Expected Discharge Plan and Services   Discharge Planning Services: CM Consult Post Acute Care Choice: Home Health Living arrangements for the past 2 months: Single Family Home                                       Social Drivers of Health (SDOH) Interventions SDOH Screenings   Food Insecurity: No Food Insecurity (10/06/2024)  Housing: Low Risk (10/06/2024)  Transportation Needs: No Transportation Needs (10/06/2024)  Utilities: Not At Risk (10/06/2024)  Alcohol Screen: Low Risk (11/18/2023)  Depression (PHQ2-9): Low Risk (08/21/2024)  Tobacco Use: Medium Risk (10/14/2024)    Readmission Risk Interventions    07/10/2024   11:05 AM 10/10/2023   10:37 AM  Readmission Risk Prevention Plan  Transportation Screening Complete Complete  Home Care Screening Complete Complete  Medication Review (RN CM) Complete Complete

## 2024-10-22 DIAGNOSIS — I272 Pulmonary hypertension, unspecified: Secondary | ICD-10-CM | POA: Diagnosis not present

## 2024-10-22 DIAGNOSIS — N1831 Chronic kidney disease, stage 3a: Secondary | ICD-10-CM | POA: Diagnosis not present

## 2024-10-22 DIAGNOSIS — D696 Thrombocytopenia, unspecified: Secondary | ICD-10-CM | POA: Diagnosis not present

## 2024-10-22 DIAGNOSIS — J9601 Acute respiratory failure with hypoxia: Secondary | ICD-10-CM | POA: Diagnosis not present

## 2024-10-22 DIAGNOSIS — I480 Paroxysmal atrial fibrillation: Secondary | ICD-10-CM | POA: Diagnosis not present

## 2024-10-22 DIAGNOSIS — E1165 Type 2 diabetes mellitus with hyperglycemia: Secondary | ICD-10-CM | POA: Diagnosis not present

## 2024-10-22 DIAGNOSIS — D72829 Elevated white blood cell count, unspecified: Secondary | ICD-10-CM | POA: Diagnosis not present

## 2024-10-22 DIAGNOSIS — I5023 Acute on chronic systolic (congestive) heart failure: Secondary | ICD-10-CM | POA: Diagnosis not present

## 2024-10-22 DIAGNOSIS — E871 Hypo-osmolality and hyponatremia: Secondary | ICD-10-CM | POA: Diagnosis not present

## 2024-10-22 DIAGNOSIS — E1122 Type 2 diabetes mellitus with diabetic chronic kidney disease: Secondary | ICD-10-CM | POA: Diagnosis not present

## 2024-10-22 DIAGNOSIS — D649 Anemia, unspecified: Secondary | ICD-10-CM | POA: Diagnosis not present

## 2024-10-22 DIAGNOSIS — I472 Ventricular tachycardia, unspecified: Secondary | ICD-10-CM | POA: Diagnosis not present

## 2024-10-22 DIAGNOSIS — Z95811 Presence of heart assist device: Secondary | ICD-10-CM | POA: Diagnosis not present

## 2024-10-22 DIAGNOSIS — I4891 Unspecified atrial fibrillation: Secondary | ICD-10-CM | POA: Diagnosis not present

## 2024-10-22 LAB — POCT I-STAT 7, (LYTES, BLD GAS, ICA,H+H)
Acid-Base Excess: 7 mmol/L — ABNORMAL HIGH (ref 0.0–2.0)
Bicarbonate: 30.5 mmol/L — ABNORMAL HIGH (ref 20.0–28.0)
Calcium, Ion: 1.11 mmol/L — ABNORMAL LOW (ref 1.15–1.40)
HCT: 27 % — ABNORMAL LOW (ref 39.0–52.0)
Hemoglobin: 9.2 g/dL — ABNORMAL LOW (ref 13.0–17.0)
O2 Saturation: 94 %
Patient temperature: 98.1
Potassium: 2.6 mmol/L — CL (ref 3.5–5.1)
Sodium: 129 mmol/L — ABNORMAL LOW (ref 135–145)
TCO2: 32 mmol/L (ref 22–32)
pCO2 arterial: 38.2 mmHg (ref 32–48)
pH, Arterial: 7.51 — ABNORMAL HIGH (ref 7.35–7.45)
pO2, Arterial: 63 mmHg — ABNORMAL LOW (ref 83–108)

## 2024-10-22 LAB — PROTIME-INR
INR: 2 — ABNORMAL HIGH (ref 0.8–1.2)
Prothrombin Time: 23.7 s — ABNORMAL HIGH (ref 11.4–15.2)

## 2024-10-22 LAB — COOXEMETRY PANEL
Carboxyhemoglobin: 2.3 % — ABNORMAL HIGH (ref 0.5–1.5)
Carboxyhemoglobin: 2.5 % — ABNORMAL HIGH (ref 0.5–1.5)
Methemoglobin: <0.7 % (ref 0.0–1.5)
Methemoglobin: <0.7 % (ref 0.0–1.5)
O2 Saturation: 64.9 %
O2 Saturation: 59.2 %
Total hemoglobin: 8.6 g/dL — ABNORMAL LOW (ref 12.0–16.0)
Total hemoglobin: 8.9 g/dL — ABNORMAL LOW (ref 12.0–16.0)

## 2024-10-22 LAB — MAGNESIUM: Magnesium: 1.8 mg/dL (ref 1.7–2.4)

## 2024-10-22 LAB — CBC WITH DIFFERENTIAL/PLATELET
Abs Immature Granulocytes: 0.11 K/uL — ABNORMAL HIGH (ref 0.00–0.07)
Basophils Absolute: 0 K/uL (ref 0.0–0.1)
Basophils Relative: 0 %
Eosinophils Absolute: 0.1 K/uL (ref 0.0–0.5)
Eosinophils Relative: 1 %
HCT: 25.1 % — ABNORMAL LOW (ref 39.0–52.0)
Hemoglobin: 8.2 g/dL — ABNORMAL LOW (ref 13.0–17.0)
Immature Granulocytes: 1 %
Lymphocytes Relative: 4 %
Lymphs Abs: 0.4 K/uL — ABNORMAL LOW (ref 0.7–4.0)
MCH: 31.1 pg (ref 26.0–34.0)
MCHC: 32.7 g/dL (ref 30.0–36.0)
MCV: 95.1 fL (ref 80.0–100.0)
Monocytes Absolute: 0.6 K/uL (ref 0.1–1.0)
Monocytes Relative: 6 %
Neutro Abs: 10 K/uL — ABNORMAL HIGH (ref 1.7–7.7)
Neutrophils Relative %: 88 %
Platelets: 174 K/uL (ref 150–400)
RBC: 2.64 MIL/uL — ABNORMAL LOW (ref 4.22–5.81)
RDW: 18.3 % — ABNORMAL HIGH (ref 11.5–15.5)
WBC: 11.3 K/uL — ABNORMAL HIGH (ref 4.0–10.5)
nRBC: 0.3 % — ABNORMAL HIGH (ref 0.0–0.2)

## 2024-10-22 LAB — RENAL FUNCTION PANEL
Albumin: 3 g/dL — ABNORMAL LOW (ref 3.5–5.0)
Anion gap: 11 (ref 5–15)
BUN: 38 mg/dL — ABNORMAL HIGH (ref 8–23)
CO2: 30 mmol/L (ref 22–32)
Calcium: 8.6 mg/dL — ABNORMAL LOW (ref 8.9–10.3)
Chloride: 87 mmol/L — ABNORMAL LOW (ref 98–111)
Creatinine, Ser: 1.4 mg/dL — ABNORMAL HIGH (ref 0.61–1.24)
GFR, Estimated: 56 mL/min — ABNORMAL LOW
Glucose, Bld: 216 mg/dL — ABNORMAL HIGH (ref 70–99)
Phosphorus: 3.1 mg/dL (ref 2.5–4.6)
Potassium: 2.9 mmol/L — ABNORMAL LOW (ref 3.5–5.1)
Sodium: 128 mmol/L — ABNORMAL LOW (ref 135–145)

## 2024-10-22 LAB — BASIC METABOLIC PANEL WITH GFR
Anion gap: 13 (ref 5–15)
BUN: 42 mg/dL — ABNORMAL HIGH (ref 8–23)
CO2: 27 mmol/L (ref 22–32)
Calcium: 8.9 mg/dL (ref 8.9–10.3)
Chloride: 88 mmol/L — ABNORMAL LOW (ref 98–111)
Creatinine, Ser: 1.51 mg/dL — ABNORMAL HIGH (ref 0.61–1.24)
GFR, Estimated: 52 mL/min — ABNORMAL LOW
Glucose, Bld: 138 mg/dL — ABNORMAL HIGH (ref 70–99)
Potassium: 4.8 mmol/L (ref 3.5–5.1)
Sodium: 127 mmol/L — ABNORMAL LOW (ref 135–145)

## 2024-10-22 LAB — GLUCOSE, CAPILLARY
Glucose-Capillary: 207 mg/dL — ABNORMAL HIGH (ref 70–99)
Glucose-Capillary: 232 mg/dL — ABNORMAL HIGH (ref 70–99)
Glucose-Capillary: 120 mg/dL — ABNORMAL HIGH (ref 70–99)
Glucose-Capillary: 95 mg/dL (ref 70–99)

## 2024-10-22 LAB — LACTATE DEHYDROGENASE: LDH: 366 U/L — ABNORMAL HIGH (ref 105–235)

## 2024-10-22 MED ORDER — POTASSIUM CHLORIDE 10 MEQ/100ML IV SOLN FOR RAPID INFUSION
10.0000 meq | INTRAVENOUS | Status: AC
  Administered 2024-10-22 (×4): 10 meq via INTRAVENOUS
  Filled 2024-10-22 (×4): qty 100

## 2024-10-22 MED ORDER — SENNOSIDES-DOCUSATE SODIUM 8.6-50 MG PO TABS
2.0000 | ORAL_TABLET | Freq: Every day | ORAL | Status: DC
  Administered 2024-10-22: 2 via ORAL
  Filled 2024-10-22: qty 2

## 2024-10-22 MED ORDER — MAGNESIUM SULFATE 2 GM/50ML IV SOLN
2.0000 g | Freq: Once | INTRAVENOUS | Status: AC
  Administered 2024-10-22: 2 g via INTRAVENOUS
  Filled 2024-10-22: qty 50

## 2024-10-22 MED ORDER — LACTULOSE 10 GM/15ML PO SOLN
10.0000 g | Freq: Once | ORAL | Status: AC
  Administered 2024-10-22: 10 g via ORAL
  Filled 2024-10-22: qty 15

## 2024-10-22 MED ORDER — POTASSIUM CHLORIDE 20 MEQ PO PACK
40.0000 meq | PACK | ORAL | Status: DC
  Administered 2024-10-22: 40 meq via ORAL
  Filled 2024-10-22 (×2): qty 2

## 2024-10-22 MED ORDER — DIGOXIN 125 MCG PO TABS
0.0625 mg | ORAL_TABLET | Freq: Every day | ORAL | Status: DC
  Administered 2024-10-22 – 2024-11-02 (×12): 0.0625 mg via ORAL
  Filled 2024-10-22 (×12): qty 1

## 2024-10-22 MED ORDER — INSULIN ASPART 100 UNIT/ML IJ SOLN
2.0000 [IU] | Freq: Three times a day (TID) | INTRAMUSCULAR | Status: DC
  Administered 2024-10-22 – 2024-10-24 (×6): 2 [IU] via SUBCUTANEOUS
  Filled 2024-10-22 (×5): qty 2

## 2024-10-22 MED ORDER — WARFARIN SODIUM 2 MG PO TABS
4.0000 mg | ORAL_TABLET | ORAL | Status: AC
  Administered 2024-10-22: 4 mg via ORAL
  Filled 2024-10-22: qty 2

## 2024-10-22 MED ORDER — METOLAZONE 2.5 MG PO TABS
2.5000 mg | ORAL_TABLET | Freq: Once | ORAL | Status: AC
  Administered 2024-10-22: 2.5 mg via ORAL
  Filled 2024-10-22: qty 1

## 2024-10-22 MED ORDER — POTASSIUM CHLORIDE CRYS ER 20 MEQ PO TBCR
40.0000 meq | EXTENDED_RELEASE_TABLET | ORAL | Status: DC
  Administered 2024-10-22: 40 meq via ORAL
  Filled 2024-10-22 (×2): qty 2

## 2024-10-22 NOTE — Inpatient Diabetes Management (Signed)
 Inpatient Diabetes Program Recommendations  AACE/ADA: New Consensus Statement on Inpatient Glycemic Control (2015)  Target Ranges:  Prepandial:   less than 140 mg/dL      Peak postprandial:   less than 180 mg/dL (1-2 hours)      Critically ill patients:  140 - 180 mg/dL   Lab Results  Component Value Date   GLUCAP 207 (H) 10/22/2024   HGBA1C 6.0 (H) 07/10/2024    Review of Glycemic Control  Latest Reference Range & Units 10/21/24 17:49 10/21/24 20:57 10/22/24 07:54  Glucose-Capillary 70 - 99 mg/dL 829 (H) 898 (H) 792 (H)   Diabetes history: DM 2 Outpatient Diabetes medications:  Farxiga  10 mg daily Metformin 500 mg bid Current orders for Inpatient glycemic control:  Novolog  0-15 units tid with meals and HS Semglee  20 units daily  Inpatient Diabetes Program Recommendations:   Consider increasing Semglee  to 25 units daily and consider adding Novolog  meal coverage 2 units tid with meals (hold if patient eats less than 50% or NPO).   Thanks,  Randall Bullocks, RN, BC-ADM Inpatient Diabetes Coordinator Pager [phone]  (8a-5p)

## 2024-10-22 NOTE — Progress Notes (Signed)
 LVAD Coordinator Rounding Note:  Admitted 10/01/24 for optimization prior to LVAD implant. Impella 5.5 placed 10/06/24.  HM 3 LVAD implanted on 10/14/24 by Dr Lucas under destination therapy criteria.  12/18: Milrinone  titrated from 0.25 to 0.5 mcg/kg/min post RHC d/t low-output and pulmonary hypertension 12/19: NE added 12/22: Markedly elevated PA pressures, elevated PCWP and elevated SVR. Titrated off NE. Added DBA.  12/23: Impella 5.5 Implanted.  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 Heparin  > Bival.  12/31: HM III LVAD implant 1/5: LVAD speed increased to 5400   Pt sitting in chair on my arrival. Ambulated in hall this morning. States he is still having weakness in his left hand. CT head negative yesterday. He continues to be very deconditioned. CIR following.  Vital signs: Temp: 98.3 HR: 115 Doppler Pressure: 64 Arterial BP: 80/68 (73) Automatic BP: 82/71 (77) O2 Sat: 96% on RA Wt: 235.4>244.3>243.4>234.8>223.5>239.6>220.2 lb    LVAD interrogation reveals:  Speed: 5400 Flow: 5.3 Power: 3.8 w PI: 2.5   Alarms: none Events:  none Hematocrit: 25  Fixed speed: 5400 Low speed limit: 5100   Drive Line: Existing VAD dressing removed and site care performed using sterile technique. Drive line exit site cleaned with Chlora prep applicators x 2, allowed to dry, and gauze dressing with Silverlon patch applied. Exit site unincorporated, the velour is fully implanted at exit site. Moderate amount of serosanguinous drainage present on previous dressing. No redness, tenderness, foul odor or rash noted. Cath grip anchor secure. Continue daily dressing changes by VAD coordinator or nurse champion. Next dressing change due 10/23/24.        Labs:  LDH trend: 703>615>541>598>724>405>366  INR trend: 1.3>1.4>1.4>1.6>1.7>1.7>2.0  Anticoagulation Plan: - INR Goal: 2.0 - 2.5 - Coumadin  dosing per pharmacy  Blood Products:  Intra-op 10/14/24:   4 FFP  2 platelets  700 cellsaver  DDAVP  x  2 Post-op:  10/15/24: 1 PRBC  Device: - Biotronik -Therapies: OFF  Arrythmias: PVCs/NSVT- currently on Amiodarone  drip. On Mexiletine 200 mg TID.  Respiratory: extubated 10/15/24  Infection:   Renal:  - BUN/CRT: 38/1.4  Drips:  Epinephrine - stopped Milrinone  0.25 mcg/kg/min Amiodarone  30 mg/hr  Adverse Events on VAD:  Patient Education:  Pt perform self test today independently Equipment and back up bag reviewed today during physical therapy  Plan/Recommendations:  1. Page VAD coordinator for any equipment or drive line concerns 2. Daily drive line dressing changes by VAD coordinator or nurse champion  Schuyler Lunger RN, BSN VAD Coordinator 24/7 Pager [phone]

## 2024-10-22 NOTE — Progress Notes (Addendum)
 "    Advanced Heart Failure Rounding Note   [facility] Cardiologist: Dr. Zenaida  Patient Profile   James [name] is a 64 y.o. male with history of stage D cardiomyopathy/chronic systolic heart failure now end-stage w/ inotrope dependence, CKD IIIa, hx VT, PAF, prostate cancer, meningioma.   Admitted for LVAD implant/ pre-VAD HF optimization.   Significant events:   12/18: Milrinone  titrated from 0.25 to 0.5 mcg/kg/min post RHC d/t low-output and pulmonary hypertension 12/19: NE added 12/22: Markedly elevated PA pressures, elevated PCWP and elevated SVR. Titrated off NE. Added DBA.  12/23: Impella 5.5 Implanted.  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 Heparin  > Bival.  12/31: HM III LVAD implant 1/5: LVAD speed increased to 5400  1/6: LUE weakness, head CT negative   Subjective:    POD # 8  Remains on Milrinone  0.25 + Epi 1. Co-ox 65%, FICK CI 2.8.  3.1 L in UOP yesterday. Nearing pre-op wt. SCr improving, 1.4 today. K 2.9, Mg 1.8, Na 131 (corrected for gluc)    Head CT done yesterday for LUE weakness and was negative.   Overall feeing better. Sitting up in chair. No dyspnea. Main complaint is constipation. No BM in 2 days.   LVAD INTERROGATION:  HeartMate IIl LVAD:  Flow 5.4 liters/min, speed 5400, power 3.8, PI 2.3. No PI events   Doppler MAP: 80   Objective:    Weight Range: 99.9 kg Body mass index is 25.45 kg/m.   Vital Signs:   Temp:  [97.8 F (36.6 C)-98.3 F (36.8 C)] 98.1 F (36.7 C) (01/08 0356) Pulse Rate:  [189-407] 407 (01/08 0000) Resp:  [12-32] 32 (01/08 0700) BP: (70-88)/(60-80) 80/60 (01/08 0400) SpO2:  [81 %-100 %] 100 % (01/08 0000) Arterial Line BP: (68-114)/(54-91) 104/91 (01/08 0700) Weight:  [99.9 kg] 99.9 kg (01/08 0712) Last BM Date : 10/20/24  Weight change: Filed Weights   10/20/24 0600 10/21/24 0630 10/22/24 0712  Weight: 101.4 kg 108.7 kg 99.9 kg   Intake/Output:  Intake/Output Summary (Last 24 hours) at 10/22/2024 0749 Last data filed at  10/22/2024 0400 Gross per 24 hour  Intake 721.87 ml  Output 3100 ml  Net -2378.13 ml    Physical Exam   GENERAL: no acute distress. HEENT: normal  NECK: Supple, JVP 8 cm  CARDIAC:  Mechanical heart sounds with LVAD hum present.  LUNGS:  Clear to auscultation bilaterally.  ABDOMEN:  Soft, round, nontender, positive bowel sounds x4.     LVAD exit site:   Dressing dry and intact.  No erythema or drainage.  Stabilization device present and accurately applied.   EXTREMITIES:  + unna boots, trace b/l LEE  NEUROLOGIC:  Alert and oriented x 4.  Gait steady.  No aphasia.  No dysarthria.  Affect pleasant.        Telemetry   Afib 110s, personally reviewed   Labs   CBC Recent Labs    10/21/24 0433 10/21/24 0447 10/22/24 0421 10/22/24 0439  WBC 11.1*  --  11.3*  --   NEUTROABS 9.6*  --  10.0*  --   HGB 8.6*   < > 8.2* 9.2*  HCT 25.2*   < > 25.1* 27.0*  MCV 91.6  --  95.1  --   PLT 190  --  174  --    < > = values in this interval not displayed.   Basic Metabolic Panel Recent Labs    98/92/73 0433 10/21/24 0447 10/21/24 1324 10/22/24 0421 10/22/24 0439  NA 131*   < >  129* 128* 129*  K 3.4*   < > 4.3 2.9* 2.6*  CL 89*  --  87* 87*  --   CO2 32  --  31 30  --   GLUCOSE 167*  --  285* 216*  --   BUN 39*  --  42* 38*  --   CREATININE 1.54*  --  1.54* 1.40*  --   CALCIUM  8.8*  --  8.3* 8.6*  --   MG 2.1  --   --  1.8  --   PHOS 2.7  --   --  3.1  --    < > = values in this interval not displayed.   Liver Function Tests Recent Labs    10/21/24 0433 10/22/24 0421  ALBUMIN  3.2* 3.0*     BNP (last 3 results) Recent Labs    06/10/24 1213 07/09/24 1649 07/23/24 0959  BNP 1,071.5* 3,316.0* 595.8*   ProBNP (last 3 results) Recent Labs    10/01/24 1030 10/15/24 0400 10/21/24 0433  PROBNP 8,374.0* 9,508.0* 5,277.0*   Medications:    Scheduled Medications:  Chlorhexidine  Gluconate Cloth  6 each Topical Daily   furosemide   80 mg Intravenous BID   gabapentin   200  mg Oral QHS   insulin  aspart  0-15 Units Subcutaneous TID WC   insulin  aspart  0-5 Units Subcutaneous QHS   insulin  glargine-yfgn  20 Units Subcutaneous Daily   lactose free nutrition  237 mL Oral TID BM   melatonin  3 mg Oral QHS   mexiletine  200 mg Oral Q8H   pantoprazole   40 mg Oral Daily   sildenafil   20 mg Oral TID   sodium chloride  flush  10-40 mL Intracatheter Q12H   spironolactone   25 mg Oral Daily   Warfarin - Pharmacist Dosing Inpatient   Does not apply q1600    Infusions:  amiodarone  60 mg/hr (10/22/24 0351)   epinephrine  1 mcg/min (10/21/24 2200)   milrinone  0.25 mcg/kg/min (10/22/24 0126)    PRN Medications: dextrose , guaiFENesin -dextromethorphan , ondansetron  (ZOFRAN ) IV, mouth rinse, oxyCODONE , phenol, polyethylene glycol, senna, traMADol , traZODone   Assessment/Plan   Acute on chronic systolic CHF, NYHA IV: Nonischemic cardiomyopathy, planned LVAD. RHC 12/18 showed elevated filling pressures with mod-severe mixed pulmonary arterial/pulmonary venous hypertension and low CO despite milrinone  0.25, increased to 0.5. Impella 5.5 placed 12/23 for further optimization. - S/p HM III LVAD implant by Dr. Lucas 10/14/24 - iNO off 1/1 - Currently on 0.25 milrinone  + 1 Epi. Co-ox 65%, CI 2.8  - Diuresing well, nearing pre-op wt. JVD ~8 cm (RN troubleshooting CVP) - Stop Epi. Repeat Co-ox late afternoon, if stable will wean Milrinone  to 0.125  - Continue IV Lasix  80 mg bid, likely transition to torsemide  tomorrow   - Continue Unna boots  - Continue sildenafil  20mg  TID for RV support - Continue spironolactone  25mg  daily  Pulmonary hypertension: Severe combined pre/post capillary PH on RHC, suspect largely due to venous remodeling. Improved with adequate unloading. - Good pulsatility, does not need further inotropic support - Continue IV Lasix  per above   AKI on CKD stage 3: Improving w/ diuresis, 1.4 today  - follow BMP   PVCs/NSVT: Improved. Has Biotronik ICD. -  Continue IV amio. - Continue mexiletine 200 TID  - Tachy therapies off for VAD implant. Turn back on prior to d/c   Paroxysmal Atrial fibrillation: In Afib this am, V-rates 110s - Continue amiodarone  gtt as above.  - aggressive K and Mg supp  - Warfarin per TCTS -  if no chemical conversion, will need TEE/DCCV   Meningioma: Monitoring.   Prostate cancer: Treated with radiation.   Thrombocytopenia: suspect consumptive with impella.  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  Critical care was necessary to treat or prevent imminent or life-threatening deterioration.  Critical care was time spent personally by me on the following activities: development of treatment plan with patient and/or surrogate as well as nursing, discussions with consultants, evaluation of patient's response to treatment, examination of patient, obtaining history from patient or surrogate, ordering and performing treatments and interventions, ordering and review of laboratory studies, ordering and review of radiographic studies, pulse oximetry and re-evaluation of patient's condition.   Please visit [email or web address]: For overnight coverage please call cardiology fellow first. If fellow not available call Shock/ECMO MD on call.  For ECMO / Mechanical Support (Impella, IABP, LVAD) issues call Shock / ECMO MD on call.   Caffie Shed, PA-C 10/22/2024  Patient seen with PA, I formulated the plan and agree with the above note.   I/Os net negative 2378 with co-ox 65% on epinephrine  1 and  milrinone  0.25.  Creatinine stable 1.54 => 1.4. CVP still elevated at 10-11.    Working with PT, has been weak.    General: Well appearing this am. NAD.  HEENT: Normal. Neck: Supple, JVP 10 cm. Carotids OK.  Cardiac:  Mechanical heart sounds with LVAD hum present.  Lungs:  CTAB, normal effort.  Abdomen:  NT, ND, no HSM. No bruits or masses. +BS  LVAD exit site: Well-healed and incorporated. Dressing dry and intact. No erythema or drainage. Stabilization device present and accurately applied. Driveline dressing changed daily per sterile technique. Extremities:  Warm and dry. No cyanosis, clubbing, rash. 1+ edema to knees.  Neuro:  Alert & oriented x 3. Cranial nerves grossly intact. Moves all 4 extremities w/o difficulty. Affect pleasant     Co-ox adequate today at 65%.  Good diuresis again, CVP 10-11 today.  BUN/creatinine stable.   - Continue current milrinone  and stop epinephrine .  If he is doing well in afternoon with stable co-ox, can decrease milrinone  to 0.125.  - Continue sildenafil  for RV support.  - Continue current LVAD speed.  Parameters stable.  - Lasix  80 mg IV bid and aggressively replace K.  Will give metolazone  2.5 x 1 with pm Lasix .    Continue amiodarone  gtt for AF, will decrease to 30 mg/hr today.  On warfarin with INR trending up, 2 today.  Will need eventual DCCV if he does not convert back to NSR on his own.    Continue to mobilize, he is very weak.   CRITICAL CARE Performed by: Ezra Shuck  Total critical care time: 40 minutes  Critical care time was exclusive of separately billable procedures and treating other patients.  Critical care was necessary to treat or prevent imminent or life-threatening deterioration.  Critical care was time spent personally by me on the following activities: development of treatment plan with patient and/or surrogate as well as nursing,  discussions with consultants, evaluation of patient's response to treatment, examination of  patient, obtaining history from patient or surrogate, ordering and performing treatments and interventions, ordering and review of laboratory studies, ordering and review of radiographic studies, pulse oximetry and re-evaluation of patient's condition.  Ezra Shuck 10/22/2024 10:56 AM  "

## 2024-10-22 NOTE — Progress Notes (Addendum)
 "  NAME:  James [name], MRN:  [id number], DOB:  [birth year], LOS: 21 ADMISSION DATE:  10/01/2024, CONSULTATION DATE:  10/14/24 REFERRING MD:  Lucas , CHIEF COMPLAINT:  s/p LVAD   History of Present Illness:  64 yo M with stg D cardiomyopathy + inotrope dependence, CKD 3a, hx VT, pAFib, meningioma who was admitted to HF service 10/01/24 for pre-VAD optimization  This included: RHC, milrinone  titration, impella placement, levo and DBA titration and volume optimization  On 10/14/24 he went for LVAD  Pump: 116 min  EBL: 750 Product: 2 plt 4 ffp 708 cellsaver  PCCM is consulted post op ICU management.  Pertinent  Medical History  HFrEF Inotrope dependence VT pAFib  Prostate cancer Meningioma  HTN DM2   Significant Hospital Events: Including procedures, antibiotic start and stop dates in addition to other pertinent events    10/01/24 RHC w low output and pHTN, and subsequent uptitration of milrinone  from 0.25 to 0.5 10/02/24 started ND 10/05/24 high PA pressures, hignPCWP high SVR. 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 Hep to bival 10/13/24 not likely HIT. Milrinone  to 0.375  10/14/24 LVAD. PCCM consult.  10/15/24 extubated, nitric off, 1u prbc, lasix  10/20/24: [name] is out 1/8- Off epi  Interim History / Subjective:  Patient is doing well.  Still edematous.  He will be off epi today.  He is sitting in chair alert and oriented.  Very weak.  Plans for CIR noted.  Objective    Blood pressure (!) 82/71, pulse 62, temperature 98 F (36.7 C), temperature source Oral, resp. rate 17, height 6' 6 (1.981 m), weight 99.9 kg, SpO2 96%. CVP:  [4 mmHg-12 mmHg] 12 mmHg      Intake/Output Summary (Last 24 hours) at 10/22/2024 1107 Last data filed at 10/22/2024 1000 Gross per 24 hour  Intake 1205.55 ml  Output 4000 ml  Net -2794.45 ml   Filed Weights    10/20/24 0600 10/21/24 0630 10/22/24 9287  Weight: 101.4 kg 108.7 kg 99.9 kg    Examination: Physical exam: General: Chronically ill-appearing male, in chair.  Alert and oriented.  No acute distress. HEENT: [place]/AT, eyes anicteric.  moist mucus membranes Neuro: Alert, awake following commands Chest: Coarse breath sounds, no wheezes or rhonchi Heart: Regular rate and rhythm, no murmurs or gallops pedal edema noted. Abdomen: Soft, nontender, nondistended, bowel sounds present    LVAD  HeartMate IIl LVAD:  Flow 5.3 liters/min, speed 5400, power 3.8, PI 2.3.   Milrinone  0.25 Amio 60   coox 64.9 %  Resolved problem list   Assessment and Plan   Acute on chronic HFrEF, stage D cardiomyopathy s/p LVAD (HM3 10/14/24)  pHTN, acute on chronic RV failure   PVCs, NSVT  pAFib  -post-op care per TCTS - Drains out. - coumadin  per pharmacy  - Continue milrinone ,  and Amio drips as above. -On Aldactone .  Lasix  at 80 twice daily. - con't mexiletine 200mg  q8h  - mobility as able   Acute hypoxic respiratory failure 2/2 pulmonary edema, deconditioning -  Resolved - On room air.  Maintain sats above 92%. - mobility -> oob if tolerates  - incentive spirometry   LUE swelling improving - Monitor  Leukocytosis resolved  - trend fever, wbc curves   CKD 3a Hyponatremia  Nephro labs stable.  Hyponatremia ongoing 130>128, may be now oral water  intake contributing in addition to his HF pathophys  -  monitor  - drink solutes in addition to water  I.e. gatorade etc.  - renal perfusion, renally dose meds, elyte replacement, strict I/O  DM2 w hyperglycemia  - ssi resistant scale  -  lantus  20 units daily  - cbg q4h   Anemia; expected operative blood loss.  Thrombocytopenia; not HIT - trend  - transfuse prn   Plans for CIR eventually.  Labs   CBC: Recent Labs  Lab 10/18/24 0514 10/18/24 1746 10/19/24 0411 10/19/24 0450 10/20/24 0426 10/20/24 0454 10/21/24 0433 10/21/24 0447  10/22/24 0421 10/22/24 0439  WBC 13.7* 12.7* 11.6*  --  9.8  --  11.1*  --  11.3*  --   NEUTROABS 11.9*  --  9.9*  --  8.4*  --  9.6*  --  10.0*  --   HGB 8.0* 8.3* 8.5*   < > 8.7* 9.2* 8.6* 9.2* 8.2* 9.2*  HCT 23.4* 26.4* 24.4*   < > 25.7* 27.0* 25.2* 27.0* 25.1* 27.0*  MCV 91.8 97.8 90.0  --  91.5  --  91.6  --  95.1  --   PLT 187 203 233  --  205  --  190  --  174  --    < > = values in this interval not displayed.    Basic Metabolic Panel: Recent Labs  Lab 10/18/24 0514 10/18/24 1617 10/19/24 0411 10/19/24 0450 10/20/24 0426 10/20/24 0454 10/20/24 1907 10/21/24 0433 10/21/24 0447 10/21/24 1324 10/22/24 0421 10/22/24 0439  NA 128* 126* 129*   < > 130*   < > 131* 131* 130* 129* 128* 129*  K 3.2* 3.5 3.0*   < > 3.2*   < > 3.3* 3.4* 3.2* 4.3 2.9* 2.6*  CL 91* 89* 88*   < > 87*  --  88* 89*  --  87* 87*  --   CO2 25 25 28    < > 32  --  30 32  --  31 30  --   GLUCOSE 100* 162* 172*   < > 220*  --  144* 167*  --  285* 216*  --   BUN 35* 39* 36*   < > 39*  --  41* 39*  --  42* 38*  --   CREATININE 1.56* 1.59* 1.54*   < > 1.67*  --  1.60* 1.54*  --  1.54* 1.40*  --   CALCIUM  8.6* 8.8* 8.7*   < > 8.8*  --  8.8* 8.8*  --  8.3* 8.6*  --   MG 2.2 2.2 2.0  --  2.0  --   --  2.1  --   --  1.8  --   PHOS 2.3*  --  2.4*  --  2.5  --   --  2.7  --   --  3.1  --    < > = values in this interval not displayed.   GFR: Estimated Creatinine Clearance: 69.8 mL/min (A) (by C-G formula based on SCr of 1.4 mg/dL (H)). Recent Labs  Lab 10/16/24 1706 10/17/24 0442 10/19/24 0411 10/20/24 0426 10/21/24 0433 10/22/24 0421  WBC  --    < > 11.6* 9.8 11.1* 11.3*  LATICACIDVEN 1.6  --   --   --   --   --    < > = values in this interval not displayed.    Liver Function Tests: Recent Labs  Lab 10/16/24 0414 10/17/24 0442 10/21/24 0433 10/22/24 0421  AST 102* 56*  --   --  ALT 15 <5  --   --   ALKPHOS 87 90  --   --   BILITOT 1.2 0.6  --   --   PROT 5.9* 5.7*  --   --   ALBUMIN  3.6  3.3* 3.2* 3.0*     ABG    Component Value Date/Time   PHART 7.510 (H) 10/22/2024 0439   PCO2ART 38.2 10/22/2024 0439   PO2ART 63 (L) 10/22/2024 0439   HCO3 30.5 (H) 10/22/2024 0439   TCO2 32 10/22/2024 0439   ACIDBASEDEF 1.0 10/17/2024 0438   O2SAT 94 10/22/2024 0439     Critical care time: NA     Tamela Stakes, MD  Attending Physician, Critical Care Medicine [facility] See Amion for pager If no response to pager, please call [phone] until 7pm After 7pm, Please call E-link [phone]     "

## 2024-10-22 NOTE — Plan of Care (Signed)
 " Problem: Education: Goal: Understanding of CV disease, CV risk reduction, and recovery process will improve Outcome: Progressing   Problem: Activity: Goal: Ability to return to baseline activity level will improve Outcome: Progressing   Problem: Cardiovascular: Goal: Ability to achieve and maintain adequate cardiovascular perfusion will improve Outcome: Progressing Goal: Vascular access site(s) Level 0-1 will be maintained Outcome: Progressing   Problem: Health Behavior/Discharge Planning: Goal: Ability to safely manage health-related needs after discharge will improve Outcome: Progressing   Problem: Education: Goal: Knowledge of General Education information will improve Description: Including pain rating scale, medication(s)/side effects and non-pharmacologic comfort measures Outcome: Progressing   Problem: Health Behavior/Discharge Planning: Goal: Ability to manage health-related needs will improve Outcome: Progressing   Problem: Clinical Measurements: Goal: Ability to maintain clinical measurements within normal limits will improve Outcome: Progressing Goal: Will remain free from infection Outcome: Progressing Goal: Diagnostic test results will improve Outcome: Progressing Goal: Respiratory complications will improve Outcome: Progressing Goal: Cardiovascular complication will be avoided Outcome: Progressing   Problem: Activity: Goal: Risk for activity intolerance will decrease Outcome: Progressing   Problem: Nutrition: Goal: Adequate nutrition will be maintained Outcome: Progressing   Problem: Coping: Goal: Level of anxiety will decrease Outcome: Progressing   Problem: Elimination: Goal: Will not experience complications related to bowel motility Outcome: Progressing Goal: Will not experience complications related to urinary retention Outcome: Progressing   Problem: Pain Managment: Goal: General experience of comfort will improve and/or be  controlled Outcome: Progressing   Problem: Safety: Goal: Ability to remain free from injury will improve Outcome: Progressing   Problem: Skin Integrity: Goal: Risk for impaired skin integrity will decrease Outcome: Progressing   Problem: Education: Goal: Ability to describe self-care measures that may prevent or decrease complications (Diabetes Survival Skills Education) will improve Outcome: Progressing   Problem: Coping: Goal: Ability to adjust to condition or change in health will improve Outcome: Progressing   Problem: Fluid Volume: Goal: Ability to maintain a balanced intake and output will improve Outcome: Progressing   Problem: Health Behavior/Discharge Planning: Goal: Ability to identify and utilize available resources and services will improve Outcome: Progressing Goal: Ability to manage health-related needs will improve Outcome: Progressing   Problem: Metabolic: Goal: Ability to maintain appropriate glucose levels will improve Outcome: Progressing   Problem: Nutritional: Goal: Maintenance of adequate nutrition will improve Outcome: Progressing Goal: Progress toward achieving an optimal weight will improve Outcome: Progressing   Problem: Skin Integrity: Goal: Risk for impaired skin integrity will decrease Outcome: Progressing   Problem: Tissue Perfusion: Goal: Adequacy of tissue perfusion will improve Outcome: Progressing   Problem: Cardiac: Goal: Ability to achieve and maintain adequate cardiopulmonary perfusion will improve Outcome: Progressing Goal: Vascular access site(s) Level 0-1 will be maintained Outcome: Progressing   Problem: Fluid Volume: Goal: Ability to achieve a balanced intake and output will improve Outcome: Progressing   Problem: Physical Regulation: Goal: Complications related to the disease process, condition or treatment will be avoided or minimized Outcome: Progressing   Problem: Respiratory: Goal: Will regain and/or maintain  adequate ventilation Outcome: Progressing   Problem: Activity: Goal: Ability to tolerate increased activity will improve Outcome: Progressing   Problem: Respiratory: Goal: Ability to maintain a clear airway and adequate ventilation will improve Outcome: Progressing   Problem: Role Relationship: Goal: Method of communication will improve Outcome: Progressing   Problem: Education: Goal: Knowledge of the prescribed therapeutic regimen will improve Outcome: Progressing   Problem: Activity: Goal: Risk for activity intolerance will decrease Outcome: Progressing   Problem: Cardiac: Goal:  Ability to maintain an adequate cardiac output will improve Outcome: Progressing   Problem: Coping: Goal: Level of anxiety will decrease Outcome: Progressing   "

## 2024-10-22 NOTE — Progress Notes (Signed)
 PHARMACY - ANTICOAGULATION CONSULT NOTE  Pharmacy Consult for warfarin Indication: LVAD HM3 + hx AFib  Allergies[1]  Patient Measurements: Height: 6' 6 (198.1 cm) Weight: 99.9 kg (220 lb 3.8 oz) IBW/kg (Calculated) : 91.4 HEPARIN  DW (KG): 108.7  Vital Signs: Temp: 98.1 F (36.7 C) (01/08 0356) Temp Source: Oral (01/08 0356) BP: 80/60 (01/08 0400) Pulse Rate: 407 (01/08 0000)  Labs: Recent Labs    10/20/24 0426 10/20/24 0454 10/21/24 0433 10/21/24 0447 10/21/24 1324 10/22/24 0421 10/22/24 0439  HGB 8.7*   < > 8.6* 9.2*  --  8.2* 9.2*  HCT 25.7*   < > 25.2* 27.0*  --  25.1* 27.0*  PLT 205  --  190  --   --  174  --   LABPROT 20.9*  --  20.8*  --   --  23.7*  --   INR 1.7*  --  1.7*  --   --  2.0*  --   CREATININE 1.67*   < > 1.54*  --  1.54* 1.40*  --    < > = values in this interval not displayed.    Estimated Creatinine Clearance: 69.8 mL/min (A) (by C-G formula based on SCr of 1.4 mg/dL (H)).   Medical History: Past Medical History:  Diagnosis Date   CKD (chronic kidney disease) stage 2, GFR 60-89 ml/min    COPD (chronic obstructive pulmonary disease) (HCC)    Diabetes mellitus type II, non insulin  dependent (HCC)    Dilated aortic root    Elevated PSA    Hypercholesteremia    Hypertension    NICM (nonischemic cardiomyopathy) (HCC)    NSVT (nonsustained ventricular tachycardia) (HCC)    Obstructive sleep apnea    Paroxysmal atrial fibrillation (HCC)    Pulmonary hypertension (HCC)    PVC's (premature ventricular contractions)    Tobacco abuse     Medications:  Scheduled:   Chlorhexidine  Gluconate Cloth  6 each Topical Daily   furosemide   80 mg Intravenous BID   gabapentin   200 mg Oral QHS   insulin  aspart  0-15 Units Subcutaneous TID WC   insulin  aspart  0-5 Units Subcutaneous QHS   insulin  glargine-yfgn  20 Units Subcutaneous Daily   lactose free nutrition  237 mL Oral TID BM   melatonin  3 mg Oral QHS   mexiletine  200 mg Oral Q8H    pantoprazole   40 mg Oral Daily   potassium chloride   40 mEq Oral Q4H   sildenafil   20 mg Oral TID   sodium chloride  flush  10-40 mL Intracatheter Q12H   spironolactone   25 mg Oral Daily   Warfarin - Pharmacist Dosing Inpatient   Does not apply q1600    Assessment: 63 yom who underwent LVAD HM3 placement on 12/31. Was on apixaban  PTA for hx Afib > now with LVAD plan to change to warfarin.   INR 2.0 today therapeutic .  Hgb 9.2, pltc 174 stable. LDH 366 continues to trend down. Still good PO intake/appetite.  Getting ~2 Ensures dailys (1/6>c).   Remains on amiodarone  IV, decreased to 30 mg/h today.   Goal of Therapy:  INR 2-2.5 Monitor platelets by anticoagulation protocol: Yes   Plan:  Warfarin 4 mg x1 PO   Monitor daily INR, CBC, and for s/sx of bleeding  F/u PO intake  Thank you for allowing pharmacy to participate in this patient's care,  Maurilio Fila, PharmD Clinical Pharmacist 10/22/2024  7:56 AM  Please check AMION for all [facility] Pharmacy phone  numbers After 10:00 PM, call Main Pharmacy [phone]    [1] No Known Allergies

## 2024-10-22 NOTE — Progress Notes (Signed)
 8 Days Post-Op Procedures (LRB): INSERTION OF IMPLANTABLE LEFT VENTRICULAR ASSIST DEVICE HEARTMATE 3; REMOVAL OF IMPELLA 5.5 (N/A) ECHOCARDIOGRAM, TRANSESOPHAGEAL, INTRAOPERATIVE (N/A) Subjective:  No specific complaints. Denies any pain. Appetite ok depending on food. Bowels working.  Ambulated half way down the hall this am which I witnessed. He looks very deconditioned, leaning forward and had to sit in recliner in the hall and get wheeled back to room.  Milrinone  0.25 and epi 1. Co-ox 65 this am.  -2400 cc yesterday. Wt down to 220. 2 lbs over immediate preop and 8 lbs over lowest preop wt.  Had head CT yesterday  due to some reports of weakness but it was negative.  Objective: Vital signs in last 24 hours: Temp:  [97.8 F (36.6 C)-98.3 F (36.8 C)] 98.3 F (36.8 C) (01/08 0758) Pulse Rate:  [189-407] 407 (01/08 0000) Cardiac Rhythm: Atrial fibrillation (01/07 2000) Resp:  [12-32] 32 (01/08 0700) BP: (70-88)/(60-80) 80/60 (01/08 0400) SpO2:  [81 %-100 %] 100 % (01/08 0000) Arterial Line BP: (68-114)/(54-91) 104/91 (01/08 0700) Weight:  [99.9 kg] 99.9 kg (01/08 0712)  Hemodynamic parameters for last 24 hours: CVP:  [9 mmHg-10 mmHg] 9 mmHg  Intake/Output from previous day: 01/07 0701 - 01/08 0700 In: 721.9 [I.V.:621.9; IV Piggyback:100] Out: 3100 [Urine:3100] Intake/Output this shift: No intake/output data recorded.  General appearance: alert and cooperative Neurologic: intact and some tremor in left arm. Heart: regular rate and rhythm Lungs: clear to auscultation bilaterally Abdomen: soft, non-tender; bowel sounds normal Extremities: Unna dressings on Wound: chest incision and right axillary incision healing well. Packing changed in Impella exit site and it looks good, closing in.  Lab Results: Recent Labs    10/21/24 0433 10/21/24 0447 10/22/24 0421 10/22/24 0439  WBC 11.1*  --  11.3*  --   HGB 8.6*   < > 8.2* 9.2*  HCT 25.2*   < > 25.1* 27.0*  PLT 190  --   174  --    < > = values in this interval not displayed.   BMET:  Recent Labs    10/21/24 1324 10/22/24 0421 10/22/24 0439  NA 129* 128* 129*  K 4.3 2.9* 2.6*  CL 87* 87*  --   CO2 31 30  --   GLUCOSE 285* 216*  --   BUN 42* 38*  --   CREATININE 1.54* 1.40*  --   CALCIUM  8.3* 8.6*  --     PT/INR:  Recent Labs    10/22/24 0421  LABPROT 23.7*  INR 2.0*   ABG    Component Value Date/Time   PHART 7.510 (H) 10/22/2024 0439   HCO3 30.5 (H) 10/22/2024 0439   TCO2 32 10/22/2024 0439   ACIDBASEDEF 1.0 10/17/2024 0438   O2SAT 94 10/22/2024 0439   CBG (last 3)  Recent Labs    10/21/24 1153 10/21/24 1749 10/21/24 2057  GLUCAP 254* 170* 101*   LVAD parameters stable.  Assessment/Plan: S/P Procedures (LRB): INSERTION OF IMPLANTABLE LEFT VENTRICULAR ASSIST DEVICE HEARTMATE 3; REMOVAL OF IMPELLA 5.5 (N/A) ECHOCARDIOGRAM, TRANSESOPHAGEAL, INTRAOPERATIVE (N/A)  POD 8  Hemodynamics stable in atrial fib with mild tachycardia on IV amio. He may not convert until off inotropes and K+ corrected. AHF team deciding on inotropes.  INR 2.0. Pharmacy managing Coumadin .  Continue PT/OT. Likely will need CIR.  No other surgical issues at this time.   LOS: 21 days    James [name] 10/22/2024

## 2024-10-22 NOTE — Progress Notes (Signed)
 TCTS PM Rounding Progress Note  Walked 1/2 lap around the unit today Had BM Epi off, milrinone  0.25, co-ox  59.2 In good spirits  Vitals:   10/22/24 1600 10/22/24 1715  BP: (!) 79/50   Pulse:  (!) 116  Resp: 19 19  Temp:    SpO2:      Plan: - Inotropes per HF team  Con Clunes, MD Cardiothoracic Surgery Pager: [phone]

## 2024-10-23 ENCOUNTER — Inpatient Hospital Stay (HOSPITAL_COMMUNITY)

## 2024-10-23 DIAGNOSIS — N1831 Chronic kidney disease, stage 3a: Secondary | ICD-10-CM | POA: Diagnosis not present

## 2024-10-23 DIAGNOSIS — D696 Thrombocytopenia, unspecified: Secondary | ICD-10-CM | POA: Diagnosis not present

## 2024-10-23 DIAGNOSIS — I472 Ventricular tachycardia, unspecified: Secondary | ICD-10-CM | POA: Diagnosis not present

## 2024-10-23 DIAGNOSIS — I480 Paroxysmal atrial fibrillation: Secondary | ICD-10-CM | POA: Diagnosis not present

## 2024-10-23 DIAGNOSIS — J9620 Acute and chronic respiratory failure, unspecified whether with hypoxia or hypercapnia: Secondary | ICD-10-CM | POA: Diagnosis not present

## 2024-10-23 DIAGNOSIS — I5023 Acute on chronic systolic (congestive) heart failure: Secondary | ICD-10-CM | POA: Diagnosis not present

## 2024-10-23 DIAGNOSIS — Z95811 Presence of heart assist device: Secondary | ICD-10-CM | POA: Diagnosis not present

## 2024-10-23 DIAGNOSIS — E1165 Type 2 diabetes mellitus with hyperglycemia: Secondary | ICD-10-CM | POA: Diagnosis not present

## 2024-10-23 DIAGNOSIS — D649 Anemia, unspecified: Secondary | ICD-10-CM | POA: Diagnosis not present

## 2024-10-23 DIAGNOSIS — E871 Hypo-osmolality and hyponatremia: Secondary | ICD-10-CM | POA: Diagnosis not present

## 2024-10-23 DIAGNOSIS — E1122 Type 2 diabetes mellitus with diabetic chronic kidney disease: Secondary | ICD-10-CM | POA: Diagnosis not present

## 2024-10-23 DIAGNOSIS — Z9911 Dependence on respirator [ventilator] status: Secondary | ICD-10-CM | POA: Diagnosis not present

## 2024-10-23 DIAGNOSIS — I272 Pulmonary hypertension, unspecified: Secondary | ICD-10-CM | POA: Diagnosis not present

## 2024-10-23 LAB — BASIC METABOLIC PANEL WITH GFR
Anion gap: 11 (ref 5–15)
BUN: 38 mg/dL — ABNORMAL HIGH (ref 8–23)
CO2: 28 mmol/L (ref 22–32)
Calcium: 8.5 mg/dL — ABNORMAL LOW (ref 8.9–10.3)
Chloride: 90 mmol/L — ABNORMAL LOW (ref 98–111)
Creatinine, Ser: 1.44 mg/dL — ABNORMAL HIGH (ref 0.61–1.24)
GFR, Estimated: 55 mL/min — ABNORMAL LOW
Glucose, Bld: 185 mg/dL — ABNORMAL HIGH (ref 70–99)
Potassium: 3.9 mmol/L (ref 3.5–5.1)
Sodium: 129 mmol/L — ABNORMAL LOW (ref 135–145)

## 2024-10-23 LAB — CBC WITH DIFFERENTIAL/PLATELET
Abs Immature Granulocytes: 0.13 K/uL — ABNORMAL HIGH (ref 0.00–0.07)
Basophils Absolute: 0 K/uL (ref 0.0–0.1)
Basophils Relative: 0 %
Eosinophils Absolute: 0.2 K/uL (ref 0.0–0.5)
Eosinophils Relative: 2 %
HCT: 25.9 % — ABNORMAL LOW (ref 39.0–52.0)
Hemoglobin: 8.6 g/dL — ABNORMAL LOW (ref 13.0–17.0)
Immature Granulocytes: 1 %
Lymphocytes Relative: 3 %
Lymphs Abs: 0.4 K/uL — ABNORMAL LOW (ref 0.7–4.0)
MCH: 31.2 pg (ref 26.0–34.0)
MCHC: 33.2 g/dL (ref 30.0–36.0)
MCV: 93.8 fL (ref 80.0–100.0)
Monocytes Absolute: 0.7 K/uL (ref 0.1–1.0)
Monocytes Relative: 7 %
Neutro Abs: 9.4 K/uL — ABNORMAL HIGH (ref 1.7–7.7)
Neutrophils Relative %: 87 %
Platelets: 167 K/uL (ref 150–400)
RBC: 2.76 MIL/uL — ABNORMAL LOW (ref 4.22–5.81)
RDW: 18.5 % — ABNORMAL HIGH (ref 11.5–15.5)
WBC: 10.7 K/uL — ABNORMAL HIGH (ref 4.0–10.5)
nRBC: 0.2 % (ref 0.0–0.2)

## 2024-10-23 LAB — RENAL FUNCTION PANEL
Albumin: 2.9 g/dL — ABNORMAL LOW (ref 3.5–5.0)
Anion gap: 12 (ref 5–15)
BUN: 38 mg/dL — ABNORMAL HIGH (ref 8–23)
CO2: 28 mmol/L (ref 22–32)
Calcium: 8.4 mg/dL — ABNORMAL LOW (ref 8.9–10.3)
Chloride: 89 mmol/L — ABNORMAL LOW (ref 98–111)
Creatinine, Ser: 1.33 mg/dL — ABNORMAL HIGH (ref 0.61–1.24)
GFR, Estimated: >60 mL/min
Glucose, Bld: 97 mg/dL (ref 70–99)
Phosphorus: 3 mg/dL (ref 2.5–4.6)
Potassium: 3 mmol/L — ABNORMAL LOW (ref 3.5–5.1)
Sodium: 129 mmol/L — ABNORMAL LOW (ref 135–145)

## 2024-10-23 LAB — GLUCOSE, CAPILLARY
Glucose-Capillary: 103 mg/dL — ABNORMAL HIGH (ref 70–99)
Glucose-Capillary: 222 mg/dL — ABNORMAL HIGH (ref 70–99)
Glucose-Capillary: 158 mg/dL — ABNORMAL HIGH (ref 70–99)
Glucose-Capillary: 136 mg/dL — ABNORMAL HIGH (ref 70–99)

## 2024-10-23 LAB — COOXEMETRY PANEL
Carboxyhemoglobin: 2.8 % — ABNORMAL HIGH (ref 0.5–1.5)
Carboxyhemoglobin: 2.1 % — ABNORMAL HIGH (ref 0.5–1.5)
Methemoglobin: <0.7 % (ref 0.0–1.5)
Methemoglobin: <0.7 % (ref 0.0–1.5)
O2 Saturation: 69.2 %
O2 Saturation: 59.4 %
Total hemoglobin: 8.4 g/dL — ABNORMAL LOW (ref 12.0–16.0)
Total hemoglobin: 8.5 g/dL — ABNORMAL LOW (ref 12.0–16.0)

## 2024-10-23 LAB — POCT I-STAT 7, (LYTES, BLD GAS, ICA,H+H)
Acid-Base Excess: 8 mmol/L — ABNORMAL HIGH (ref 0.0–2.0)
Bicarbonate: 31.5 mmol/L — ABNORMAL HIGH (ref 20.0–28.0)
Calcium, Ion: 1.11 mmol/L — ABNORMAL LOW (ref 1.15–1.40)
HCT: 26 % — ABNORMAL LOW (ref 39.0–52.0)
Hemoglobin: 8.8 g/dL — ABNORMAL LOW (ref 13.0–17.0)
O2 Saturation: 95 %
Patient temperature: 97.8
Potassium: 2.7 mmol/L — CL (ref 3.5–5.1)
Sodium: 128 mmol/L — ABNORMAL LOW (ref 135–145)
TCO2: 33 mmol/L — ABNORMAL HIGH (ref 22–32)
pCO2 arterial: 36.6 mmHg (ref 32–48)
pH, Arterial: 7.541 — ABNORMAL HIGH (ref 7.35–7.45)
pO2, Arterial: 65 mmHg — ABNORMAL LOW (ref 83–108)

## 2024-10-23 LAB — LACTATE DEHYDROGENASE: LDH: 294 U/L — ABNORMAL HIGH (ref 105–235)

## 2024-10-23 LAB — ECHOCARDIOGRAM LIMITED
Height: 78 in
Weight: 3587.33 [oz_av]

## 2024-10-23 LAB — MAGNESIUM: Magnesium: 2 mg/dL (ref 1.7–2.4)

## 2024-10-23 LAB — PROTIME-INR
INR: 2.1 — ABNORMAL HIGH (ref 0.8–1.2)
Prothrombin Time: 24.6 s — ABNORMAL HIGH (ref 11.4–15.2)

## 2024-10-23 MED ORDER — POTASSIUM CHLORIDE 20 MEQ PO PACK
20.0000 meq | PACK | Freq: Once | ORAL | Status: AC
  Administered 2024-10-23: 20 meq via ORAL
  Filled 2024-10-23: qty 1

## 2024-10-23 MED ORDER — POTASSIUM CHLORIDE 20 MEQ PO PACK: 40.0000 meq | PACK | ORAL | Status: DC

## 2024-10-23 MED ORDER — SPIRONOLACTONE 25 MG PO TABS
50.0000 mg | ORAL_TABLET | Freq: Every day | ORAL | Status: DC
  Administered 2024-10-23 – 2024-10-25 (×3): 50 mg via ORAL
  Filled 2024-10-23 (×3): qty 2

## 2024-10-23 MED ORDER — TORSEMIDE 20 MG PO TABS: 20.0000 mg | ORAL_TABLET | Freq: Every day | ORAL | Status: DC

## 2024-10-23 MED ORDER — WARFARIN SODIUM 2 MG PO TABS
4.0000 mg | ORAL_TABLET | Freq: Once | ORAL | Status: AC
  Administered 2024-10-23: 4 mg via ORAL
  Filled 2024-10-23: qty 2

## 2024-10-23 MED ORDER — TORSEMIDE 20 MG PO TABS
40.0000 mg | ORAL_TABLET | Freq: Every day | ORAL | Status: DC
  Administered 2024-10-23 – 2024-10-24 (×2): 40 mg via ORAL
  Filled 2024-10-23 (×3): qty 2

## 2024-10-23 MED ORDER — POTASSIUM CHLORIDE 10 MEQ/100ML IV SOLN: 10.0000 meq | INTRAVENOUS | Status: DC

## 2024-10-23 MED ORDER — POTASSIUM CHLORIDE 10 MEQ/50ML IV SOLN
10.0000 meq | INTRAVENOUS | Status: AC
  Administered 2024-10-23 (×3): 10 meq via INTRAVENOUS
  Filled 2024-10-23: qty 50

## 2024-10-23 MED ORDER — POTASSIUM CHLORIDE 20 MEQ PO PACK
20.0000 meq | PACK | ORAL | Status: DC
  Administered 2024-10-23: 20 meq
  Filled 2024-10-23: qty 1

## 2024-10-23 MED ORDER — INSULIN GLARGINE 100 UNIT/ML ~~LOC~~ SOLN
12.0000 [IU] | Freq: Every day | SUBCUTANEOUS | Status: DC
  Administered 2024-10-23 – 2024-10-26 (×4): 12 [IU] via SUBCUTANEOUS
  Filled 2024-10-23 (×5): qty 0.12

## 2024-10-23 MED ORDER — POTASSIUM CHLORIDE 20 MEQ PO PACK
40.0000 meq | PACK | ORAL | Status: AC
  Administered 2024-10-23: 40 meq via ORAL
  Filled 2024-10-23: qty 2

## 2024-10-23 NOTE — Progress Notes (Signed)
 LVAD Coordinator Rounding Note:  Admitted 10/01/24 for optimization prior to LVAD implant. Impella 5.5 placed 10/06/24.  HM 3 LVAD implanted on 10/14/24 by Dr Lucas under destination therapy criteria.  12/18: Milrinone  titrated from 0.25 to 0.5 mcg/kg/min post RHC d/t low-output and pulmonary hypertension 12/19: NE added 12/22: Markedly elevated PA pressures, elevated PCWP and elevated SVR. Titrated off NE. Added DBA.  12/23: Impella 5.5 Implanted.  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 Heparin  > Bival.  12/31: HM III LVAD implant 1/5: LVAD speed increased to 5400   Pt lying in bed on my arrival. Ambulated in the hallway twice. Ramp echo performed at bedside with Dr. Rolan. Speed increased to 5500. Continuing to progress with PT. CIR consulted.  Vital signs: Temp: 98 HR: 112 Doppler Pressure: 95 Arterial BP: 105/90 (98) Automatic BP:111/83 (93) O2 Sat: 97% on RA Wt: 235.4>244.3>243.4>234.8>223.5>239.6>220.2>224.2 lb    LVAD interrogation reveals:  Speed: 5400 Flow: 5.4 Power: 3.7 w PI: 2.3   Alarms: none Events: 0-10 PI events with some speed drops Hematocrit: 25  Fixed speed: 5400>>5500 Low speed limit: 5100>5200   Drive Line: Existing VAD dressing removed and site care performed using sterile technique. Drive line exit site cleaned with Chlora prep applicators x 2, allowed to dry, and gauze dressing with Silverlon patch applied. Exit site unincorporated, the velour is fully implanted at exit site. Moderate amount of serosanguinous drainage present on previous dressing. No redness, tenderness, foul odor or rash noted. Cath grip anchor secure. Continue daily dressing changes by VAD coordinator or nurse champion. Next dressing change due 10/24/24 by nurse champion.       Labs:  LDH trend: 703>615>541>598>724>405>366>294  INR trend: 1.3>1.4>1.4>1.6>1.7>1.7>2.0>2.1  Anticoagulation Plan: - INR Goal: 2.0 - 2.5 - Coumadin  dosing per pharmacy  Blood Products:  Intra-op 10/14/24:    4 FFP  2 platelets  700 cellsaver  DDAVP  x 2 Post-op:  10/15/24: 1 PRBC  Device: - Biotronik -Therapies: OFF  Arrythmias: PVCs/NSVT- currently on Amiodarone  drip. On Mexiletine 200 mg TID.  Respiratory: extubated 10/15/24  Infection:   Renal:  - BUN/CRT: 38/1.33  Drips:  Milrinone  0.125 mcg/kg/min Amiodarone  30 mg/hr  Adverse Events on VAD:  Patient Education:  Performed power source change with physical therapy today during session  Plan/Recommendations:  1. Page VAD coordinator for any equipment or drive line concerns 2. Daily drive line dressing changes by VAD coordinator or nurse champion  Schuyler Lunger RN, BSN VAD Coordinator 24/7 Pager [phone]

## 2024-10-23 NOTE — Progress Notes (Addendum)
 "    Advanced Heart Failure Rounding Note   [facility] Cardiologist: Dr. Zenaida Chief Complaint: Post-op LVAD Patient Profile   James [name] is a 64 y.o. male with history of stage D cardiomyopathy/chronic systolic heart failure now end-stage w/ inotrope dependence, CKD IIIa, hx VT, PAF, prostate cancer, meningioma.   Admitted for LVAD implant/ pre-VAD HF optimization.   Significant events:   12/18: Milrinone  titrated from 0.25 to 0.5 mcg/kg/min post RHC d/t low-output and pulmonary hypertension 12/19: NE added 12/22: Markedly elevated PA pressures, elevated PCWP and elevated SVR. Titrated off NE. Added DBA.  12/23: Impella 5.5 Implanted.  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 Heparin  > Bival.  12/31: HM III LVAD implant 1/5: LVAD speed increased to 5400  1/6: LUE weakness, head CT negative   Subjective:    S/P HM3 LVAD 10/14/24.  Co-ox 69%. On milrinone  0.25. Off Epi. CVP 9 Net negative 1L. ?Weight up 4 lbs from yesterday. K 3.0, Mg 2.0, NA 129  Sitting up in bed. Not motivated to move. No SOB. Had large BM overnight.   LVAD INTERROGATION:  HeartMate IIl LVAD:  Flow 5.4 liters/min, speed 5400, power 4, PI 2.2. No PI events.   Objective:    Weight Range: 101.7 kg Body mass index is 25.91 kg/m.   Vital Signs:   Temp:  [97.6 F (36.4 C)-98.3 F (36.8 C)] 97.9 F (36.6 C) (01/09 0400) Pulse Rate:  [62-164] 122 (01/09 0700) Resp:  [10-37] 21 (01/09 0700) BP: (72-100)/(38-73) 100/64 (01/09 0700) SpO2:  [94 %-99 %] 95 % (01/09 0700) Arterial Line BP: (71-100)/(54-87) 72/62 (01/09 0700) Weight:  [101.7 kg] 101.7 kg (01/09 0500) Last BM Date : 10/22/24  Doppler MAP: 70  Weight change: Filed Weights   10/21/24 0630 10/22/24 0712 10/23/24 0500  Weight: 108.7 kg 99.9 kg 101.7 kg   Intake/Output:  Intake/Output Summary (Last 24 hours) at 10/23/2024 0750 Last data filed at 10/23/2024 9357 Gross per 24 hour  Intake 1432.46 ml  Output 2500 ml  Net -1067.54 ml    Physical Exam    General: Well appearing. No distress on RA Cardiac: JVP flat. Mechanical heart sounds with LVAD hum present.  Abdomen: Soft, non-tender, distended.  Driveline: Dressing C/D/I. No drainage or redness. Anchor in place. Extremities: Warm and dry. R toe edema. Neuro: Alert and oriented x3. Affect pleasant.   Telemetry   Afib 100-110s (personally reviewed)  Labs   CBC Recent Labs    10/22/24 0421 10/22/24 0439 10/23/24 0432 10/23/24 0626 10/23/24 0639  WBC 11.3*  --  10.7*  --   --   NEUTROABS 10.0*  --  9.4*  --   --   HGB 8.2*   < > 8.6* 8.8* 8.8*  HCT 25.1*   < > 25.9* 26.0* 26.0*  MCV 95.1  --  93.8  --   --   PLT 174  --  167  --   --    < > = values in this interval not displayed.   Basic Metabolic Panel Recent Labs    98/91/73 0421 10/22/24 0439 10/22/24 1335 10/23/24 0432 10/23/24 0626 10/23/24 0639  NA 128*   < > 127* 129* 128* 128*  K 2.9*   < > 4.8 3.0* 3.2* 2.7*  CL 87*  --  88* 89*  --   --   CO2 30  --  27 28  --   --   GLUCOSE 216*  --  138* 97  --   --  BUN 38*  --  42* 38*  --   --   CREATININE 1.40*  --  1.51* 1.33*  --   --   CALCIUM  8.6*  --  8.9 8.4*  --   --   MG 1.8  --   --  2.0  --   --   PHOS 3.1  --   --  3.0  --   --    < > = values in this interval not displayed.   Liver Function Tests Recent Labs    10/22/24 0421 10/23/24 0432  ALBUMIN  3.0* 2.9*   BNP (last 3 results) Recent Labs    06/10/24 1213 07/09/24 1649 07/23/24 0959  BNP 1,071.5* 3,316.0* 595.8*   ProBNP (last 3 results) Recent Labs    10/01/24 1030 10/15/24 0400 10/21/24 0433  PROBNP 8,374.0* 9,508.0* 5,277.0*   Medications:    Scheduled Medications:  Chlorhexidine  Gluconate Cloth  6 each Topical Daily   digoxin   0.0625 mg Oral Daily   furosemide   80 mg Intravenous BID   gabapentin   200 mg Oral QHS   insulin  aspart  0-15 Units Subcutaneous TID WC   insulin  aspart  0-5 Units Subcutaneous QHS   insulin  aspart  2 Units Subcutaneous TID WC   insulin   glargine-yfgn  20 Units Subcutaneous Daily   lactose free nutrition  237 mL Oral TID BM   melatonin  3 mg Oral QHS   mexiletine  200 mg Oral Q8H   pantoprazole   40 mg Oral Daily   potassium chloride   20 mEq Per Tube Q4H   senna-docusate  2 tablet Oral QHS   sildenafil   20 mg Oral TID   sodium chloride  flush  10-40 mL Intracatheter Q12H   spironolactone   25 mg Oral Daily   Warfarin - Pharmacist Dosing Inpatient   Does not apply q1600    Infusions:  amiodarone  30 mg/hr (10/22/24 2042)   milrinone  0.25 mcg/kg/min (10/23/24 0506)    PRN Medications: dextrose , guaiFENesin -dextromethorphan , ondansetron  (ZOFRAN ) IV, mouth rinse, oxyCODONE , phenol, polyethylene glycol, senna, traMADol , traZODone   Assessment/Plan   Acute on chronic systolic CHF, NYHA IV: Nonischemic cardiomyopathy, planned LVAD. RHC 12/18 showed elevated filling pressures with mod-severe mixed pulmonary arterial/pulmonary venous hypertension and low CO despite milrinone  0.25, increased to 0.5. Impella 5.5 placed 12/23 for further optimization. - S/p HM III LVAD implant by Dr. Lucas 10/14/24 - iNO off 1/1 - Co-ox 69%. Currently on 0.25 milrinone . Wean to 0.125. Off Epi.  - CVP 9, weight remains above pre-op. Start Torsemide  40 mg daily. - Continue Unna boots  - Continue sildenafil  20mg  TID for RV support - Continue digoxin  0.0625 mg daily - Increase spirolactone 50 mg daily - Remove UNNA boots; place TED hose  Pulmonary hypertension: Severe combined pre/post capillary PH on RHC, suspect largely due to venous remodeling. Improved with adequate unloading. - Good pulsatility, does not need further inotropic support - Diuresis as above.  AKI on CKD stage 3: Improving w/ diuresis, 1.3 today  - follow BMP   PVCs/NSVT: Improved. Has Biotronik ICD. - Continue IV amio 30/hr. - Continue mexiletine 200 tid - Tachy therapies off for VAD implant. Turn back on prior to d/c   Paroxysmal Atrial fibrillation: In Afib this am,  V-rates 110s - Continue amiodarone  gtt as above.  - aggressive K and Mg supp  - Warfarin per TCTS - if no chemical conversion, will need TEE/DCCV   Meningioma: Monitoring.   Prostate cancer: Treated with radiation.   Thrombocytopenia: suspect  consumptive with impella.  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 Stable    Hypokalemia/ Hypomagnesemia  - K 3.0, Mg 2.0 - aggressive K and Mg supp   - increase spiro  Deconditioning - continue PT/OT - CIR team following   Transfer out of ICU today.  [name], NP 10/23/2024  Advanced Heart Failure Team Pager [phone] (M-F; 7a - 5p)  Please contact [facility] for night-coverage after hours (4p -7a ) and weekends on [email or web address]  Please visit [email or web address]: For overnight coverage please call cardiology fellow first. If fellow not available call Shock/ECMO MD on call.  For ECMO / Mechanical Support (Impella, IABP, LVAD) issues call Shock / ECMO MD on call.   Patient seen with NP, I formulated the plan and agree with the above note.   I/Os net negative 1068 with co-ox 69% on milrinone  0.25.  Creatinine stable 1.54 => 1.4 => 1.33. CVP now 6 on my read.    Walked around unit today.     General: Well appearing this am. NAD.  HEENT: Normal. Neck: Supple, JVP 7-8 cm. Carotids OK.  Cardiac:  Mechanical heart sounds with LVAD hum present.  Lungs:  CTAB, normal effort.  Abdomen:  NT, ND, no HSM. No bruits or masses. +BS  LVAD exit site: Well-healed and incorporated. Dressing dry and intact. No erythema or drainage. Stabilization device present and accurately applied. Driveline dressing changed daily per sterile technique. Extremities:  Warm and dry. No cyanosis, clubbing, rash. 1+ edema to knees.  Neuro:  Alert & oriented x 3. Cranial nerves grossly intact. Moves all 4 extremities w/o difficulty. Affect pleasant     Co-ox adequate today at 69%.  He  has diuresed well, CVP down to 6.  BUN/creatinine stable.   - Decrease milrinone  to 0.125 mcg/kg/min. Hopefully stop tomorrow.   - Continue sildenafil  for RV support.  - Ramp echo today => speed increased incrementally to 5500 rpm.  Septum looked midline at 5500, RV moderately hypokinetic.   - No more IV Lasix , start torsemide  40 mg daily.    Continue amiodarone  gtt for AF, on 30 mg/hr today.  On warfarin with INR trending up, 2.1 today.  Will plan TEE-DCCV when he is off milrinone , likely Monday.    Continue to mobilize, he is very weak.  Has been reticent to work with PT and walk, but doing better about this today.  Can go to [facility] today.   CRITICAL CARE Performed by: Ezra Shuck  Total critical care time: 35 minutes  Critical care time was exclusive of separately billable procedures and treating other patients.  Critical care was necessary to treat or prevent imminent or life-threatening deterioration.  Critical care was time spent personally by me on the following activities: development of treatment plan with patient and/or surrogate as well as nursing, discussions with consultants, evaluation of patient's response to treatment, examination of patient, obtaining history from patient or surrogate, ordering and performing treatments and interventions, ordering and review of laboratory studies, ordering and review of radiographic studies, pulse oximetry and re-evaluation of patient's condition.  Ezra Shuck 10/23/2024 9:50 AM  "

## 2024-10-23 NOTE — Progress Notes (Signed)
 Occupational Therapy Treatment Patient Details Name: James [name] MRN: [id number] DOB: [birth year] Today's Date: 10/23/2024   History of present illness Pt is a 64 yo male who was admitted 10/01/24 for pre-VAD optimization prior to planned surgery. S/p R heart cath 12/18 & 12/24, impella placement 12/23, impella removal and HM III LVAD placement 12/31. Extubated 10/15/24. Developed L sided weakness, CT negative for acute infarct. PMH: CKD IIIa, hx VT, PAF, prostate cancer, meningioma, COPD, DM2, HTN, hypercholesteremia, OSA, pulmonary hypertension, tobacco abuse.   OT comments  Pt with great improvement from last therapy session. Pt able to complete bed mobility at mod A +2, sit EOB with min a progressing to CGA for seated balance for power change over with mod verbal cues for problem solving. Fine motor remains sluggish and LUE remains slightly weaker than baseline (dominant hand) Pt able to ambulate in the hallway with mod A +2 for sit<>stand transfer after utilizing rocking momentum. During ambulation educated on move in the tube during functional ADL to maintain sternal precautions. Pt electing to return supine on bed pan despite education about using BSC for sacral wound. Pt continues to benefit from skilled OT acutely and post-acute at comprehensive rehab of >3 hours daily.       If plan is discharge home, recommend the following:  A lot of help with walking and/or transfers;A lot of help with bathing/dressing/bathroom   Equipment Recommendations  BSC/3in1    Recommendations for Other Services Rehab consult    Precautions / Restrictions Precautions Precautions: Fall;Sternal;Other (comment) Precaution Booklet Issued: No Recall of Precautions/Restrictions: Impaired Precaution/Restrictions Comments: LVAD;  A-line Restrictions Weight Bearing Restrictions Per Provider Order: Yes RUE Weight Bearing Per Provider Order: Non weight bearing LUE Weight Bearing Per Provider Order: Non weight  bearing Other Position/Activity Restrictions: sternal precautions       Mobility Bed Mobility Overal bed mobility: Needs Assistance Bed Mobility: Rolling, Sit to Sidelying, Sidelying to Sit Rolling: Mod assist, +2 for safety/equipment (line managementg) Sidelying to sit: Mod assist, +2 for safety/equipment, HOB elevated     Sit to sidelying: Mod assist, +2 for physical assistance General bed mobility comments: requires modAx2 for trunk elevation/descent BLE back into bed    Transfers Overall transfer level: Needs assistance Equipment used: Ambulation equipment used Transfers: Sit to/from Stand, Bed to chair/wheelchair/BSC Sit to Stand: +2 physical assistance, Mod assist, From elevated surface (+RN for management of lines)     Step pivot transfers: +2 physical assistance, Min assist     General transfer comment: Pt using rocking momentum to achieve standing with mod A +2 boost and safety. Once on feet, min A +2 safety. greatly improved from last session     Balance Overall balance assessment: Needs assistance Sitting-balance support: No upper extremity supported, Feet supported Sitting balance-Leahy Scale: Poor Sitting balance - Comments: requires min A or CGA for seated balance during power change over   Standing balance support: Bilateral upper extremity supported, During functional activity, Reliant on assistive device for balance Standing balance-Leahy Scale: Poor Standing balance comment: reliant on EVA walker                           ADL either performed or assessed with clinical judgement   ADL Overall ADL's : Needs assistance/impaired     Grooming: Wash/dry face;Minimal assistance;Sitting Grooming Details (indicate cue type and reason): EOB         Upper Body Dressing : Maximal assistance;Sitting Upper Body Dressing Details (  indicate cue type and reason): extra gown. initiated education on move in the tube and sequencing Lower Body Dressing:  Maximal assistance Lower Body Dressing Details (indicate cue type and reason): socks Toilet Transfer: Moderate assistance;+2 for safety/equipment;Ambulation (EVA walker)           Functional mobility during ADLs: Moderate assistance;+2 for physical assistance;+2 for safety/equipment (EVA walker)      Extremity/Trunk Assessment Upper Extremity Assessment Upper Extremity Assessment: Left hand dominant;RUE deficits/detail;LUE deficits/detail RUE Deficits / Details: decreased fine motor, sluggish strength overall 4-/5 grasp RUE Coordination: decreased fine motor LUE Deficits / Details: generalized weakness from baseline. improved since last session with therapy LUE Coordination: decreased fine motor   Lower Extremity Assessment Lower Extremity Assessment: Defer to PT evaluation        Vision       Perception     Praxis     Communication Communication Communication: No apparent difficulties   Cognition Arousal: Alert, Lethargic Behavior During Therapy: Flat affect Cognition: Cognition impaired     Awareness: Online awareness impaired     Executive functioning impairment (select all impairments): Problem solving OT - Cognition Comments: slower processing; needed cues for problem solving during functional tasks                 Following commands: Impaired Following commands impaired: Follows one step commands with increased time, Follows multi-step commands with increased time      Cueing   Cueing Techniques: Verbal cues, Tactile cues, Gestural cues  Exercises      Shoulder Instructions       General Comments      Pertinent Vitals/ Pain       Pain Assessment Pain Assessment: Faces Faces Pain Scale: Hurts a little bit Pain Location: chest/sacral wound Pain Descriptors / Indicators: Discomfort, Sore Pain Intervention(s): Monitored during session, Repositioned  Home Living                                          Prior  Functioning/Environment              Frequency  Min 2X/week        Progress Toward Goals  OT Goals(current goals can now be found in the care plan section)  Progress towards OT goals: Progressing toward goals     Plan      Co-evaluation    PT/OT/SLP Co-Evaluation/Treatment: Yes Reason for Co-Treatment: Complexity of the patient's impairments (multi-system involvement);For patient/therapist safety;To address functional/ADL transfers PT goals addressed during session: Mobility/safety with mobility;Balance;Proper use of DME OT goals addressed during session: ADL's and self-care;Strengthening/ROM;Proper use of Adaptive equipment and DME      AM-PAC OT 6 Clicks Daily Activity     Outcome Measure   Help from another person eating meals?: A Little Help from another person taking care of personal grooming?: A Lot Help from another person toileting, which includes using toliet, bedpan, or urinal?: A Lot Help from another person bathing (including washing, rinsing, drying)?: A Lot Help from another person to put on and taking off regular upper body clothing?: A Lot Help from another person to put on and taking off regular lower body clothing?: A Lot 6 Click Score: 13    End of Session Equipment Utilized During Treatment: Gait belt (EVA walker)  OT Visit Diagnosis: Unsteadiness on feet (R26.81);Other abnormalities of gait and mobility (R26.89);Muscle weakness (generalized) (M62.81)  Activity Tolerance Patient tolerated treatment well   Patient Left with call bell/phone within reach;with nursing/sitter in room;in bed   Nurse Communication Mobility status        Time: 8885-8796 OT Time Calculation (min): 49 min  Charges: OT General Charges $OT Visit: 1 Visit OT Treatments $Self Care/Home Management : 8-22 mins  Leita DEL OTR/L Acute Rehabilitation Services Office: [phone]  Leita PARAS [facility] 10/23/2024, 1:07 PM

## 2024-10-23 NOTE — Progress Notes (Signed)
 "  NAME:  James [name], MRN:  [id number], DOB:  [birth year], LOS: 22 ADMISSION DATE:  10/01/2024, CONSULTATION DATE:  10/14/24 REFERRING MD:  Lucas , CHIEF COMPLAINT:  s/p LVAD   History of Present Illness:  64 yo M with stg D cardiomyopathy + inotrope dependence, CKD 3a, hx VT, pAFib, meningioma who was admitted to HF service 10/01/24 for pre-VAD optimization  This included: RHC, milrinone  titration, impella placement, levo and DBA titration and volume optimization  On 10/14/24 he went for LVAD  Pump: 116 min  EBL: 750 Product: 2 plt 4 ffp 708 cellsaver  PCCM is consulted post op ICU management.  Pertinent  Medical History  HFrEF Inotrope dependence VT pAFib  Prostate cancer Meningioma  HTN DM2   Significant Hospital Events: Including procedures, antibiotic start and stop dates in addition to other pertinent events    10/01/24 RHC w low output and pHTN, and subsequent uptitration of milrinone  from 0.25 to 0.5 10/02/24 started ND 10/05/24 high PA pressures, hignPCWP high SVR. 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 Hep to bival 10/13/24 not likely HIT. Milrinone  to 0.375  10/14/24 LVAD. PCCM consult.  10/15/24 extubated, nitric off, 1u prbc, lasix  10/20/24: [name] is out 1/8- Off epi  Interim History / Subjective:  Complains of discomfort from his bedsore.  Refused to walk on night shift.  Not eating much due to poor appetite and not liking the food.  Objective    Blood pressure (!) 75/64, pulse (!) 131, temperature 98.4 F (36.9 C), temperature source Oral, resp. rate (!) 29, height 6' 6 (1.981 m), weight 101.7 kg, SpO2 97%. CVP:  [4 mmHg-16 mmHg] 5 mmHg      Intake/Output Summary (Last 24 hours) at 10/23/2024 1005 Last data filed at 10/23/2024 0900 Gross per 24 hour  Intake 1051.05 ml  Output 1900 ml  Net -848.95 ml   Filed Weights    10/21/24 0630 10/22/24 0712 10/23/24 0500  Weight: 108.7 kg 99.9 kg 101.7 kg    Examination: Physical exam: General: Chronically ill-appearing man lying bed no acute distress HEENT: /AT, eyes anicteric Neuro: Awake and alert, answering questions appropriately, moving all extremities Chest: Breathing comfortably on room air, CTAB  heart: Mechanical hum of LVAD Abdomen: Soft, nontender  Milrinone  0.53mcg Amio 30 mg  coox 60% Sodium 129 Potassium 3.0 BUN 38 Creatinine 1.33 WBC 10.7 H/H 8.6/25.9 Platelets 167  Resolved problem list   Assessment and Plan   Acute on chronic HFrEF, stage D cardiomyopathy s/p LVAD (HM3 10/14/24)  pHTN, acute on chronic RV failure   PVCs, NSVT  pAFib  -post-op care per TCTS -echo ramp study for LVAD today -coumadin  dosing per pharmacy -mexilitine, amiodarone  -digoxin  -con't spiro, torsemide  -sildenafil  for PH -needs to progress mobility  Acute hypoxic respiratory failure 2/2 pulmonary edema, deconditioning -  Resolved -pulmonary hygiene, OOB mobility -remains off O2  LUE swelling improving; no DVT on 1/2 - Monitor  Leukocytosis- persistent low level -Trend fever curve; monitor  Hypokalemia -repleted, monitor  CKD 3a -strict I/O -renally dose meds, avoid nephrotoxic meds  Hyponatremia  -avoid hypotonic fluids -family can bring food from home to encourage PO intake -encouraged to try to eat  DM2 w hyperglycemia -uncontrolled and labile - off glargine -mealtime insulin  2 units TIDAC -SSI PRN -goal BG 140-180  Anemia; expected operative blood loss.  Thrombocytopenia; not HIT - transfuse for Hb <7 or hemodynamically  significantly bleeding  Deconditioning -needs PT, OT -ancipitate he needs CIR  Insomnia -melatonin, trazodone   Decubitus ulcer sacrum -needs deep turns, wife has brought pillow to offload    Labs   CBC: Recent Labs  Lab 10/19/24 0411 10/19/24 0450 10/20/24 0426 10/20/24 0454 10/21/24 0433  10/21/24 0447 10/22/24 0421 10/22/24 0439 10/23/24 0432 10/23/24 0626 10/23/24 0639  WBC 11.6*  --  9.8  --  11.1*  --  11.3*  --  10.7*  --   --   NEUTROABS 9.9*  --  8.4*  --  9.6*  --  10.0*  --  9.4*  --   --   HGB 8.5*   < > 8.7*   < > 8.6*   < > 8.2* 9.2* 8.6* 8.8* 8.8*  HCT 24.4*   < > 25.7*   < > 25.2*   < > 25.1* 27.0* 25.9* 26.0* 26.0*  MCV 90.0  --  91.5  --  91.6  --  95.1  --  93.8  --   --   PLT 233  --  205  --  190  --  174  --  167  --   --    < > = values in this interval not displayed.    Basic Metabolic Panel: Recent Labs  Lab 10/19/24 0411 10/19/24 0450 10/20/24 0426 10/20/24 0454 10/21/24 0433 10/21/24 0447 10/21/24 1324 10/22/24 0421 10/22/24 0439 10/22/24 1335 10/23/24 0432 10/23/24 0626 10/23/24 0639  NA 129*   < > 130*   < > 131*   < > 129* 128* 129* 127* 129* 128* 128*  K 3.0*   < > 3.2*   < > 3.4*   < > 4.3 2.9* 2.6* 4.8 3.0* 3.2* 2.7*  CL 88*   < > 87*   < > 89*  --  87* 87*  --  88* 89*  --   --   CO2 28   < > 32   < > 32  --  31 30  --  27 28  --   --   GLUCOSE 172*   < > 220*   < > 167*  --  285* 216*  --  138* 97  --   --   BUN 36*   < > 39*   < > 39*  --  42* 38*  --  42* 38*  --   --   CREATININE 1.54*   < > 1.67*   < > 1.54*  --  1.54* 1.40*  --  1.51* 1.33*  --   --   CALCIUM  8.7*   < > 8.8*   < > 8.8*  --  8.3* 8.6*  --  8.9 8.4*  --   --   MG 2.0  --  2.0  --  2.1  --   --  1.8  --   --  2.0  --   --   PHOS 2.4*  --  2.5  --  2.7  --   --  3.1  --   --  3.0  --   --    < > = values in this interval not displayed.   GFR: Estimated Creatinine Clearance: 73.5 mL/min (A) (by C-G formula based on SCr of 1.33 mg/dL (H)). Recent Labs  Lab 10/16/24 1706 10/17/24 0442 10/20/24 0426 10/21/24 0433 10/22/24 0421 10/23/24 0432  WBC  --    < > 9.8 11.1* 11.3* 10.7*  LATICACIDVEN 1.6  --   --   --   --   --    < > =  values in this interval not displayed.    Liver Function Tests: Recent Labs  Lab 10/17/24 0442 10/21/24 0433  10/22/24 0421 10/23/24 0432  AST 56*  --   --   --   ALT <5  --   --   --   ALKPHOS 90  --   --   --   BILITOT 0.6  --   --   --   PROT 5.7*  --   --   --   ALBUMIN  3.3* 3.2* 3.0* 2.9*     ABG    Component Value Date/Time   PHART 7.541 (H) 10/23/2024 0639   PCO2ART 36.6 10/23/2024 0639   PO2ART 65 (L) 10/23/2024 0639   HCO3 31.5 (H) 10/23/2024 0639   TCO2 33 (H) 10/23/2024 0639   ACIDBASEDEF 1.0 10/17/2024 0438   O2SAT 95 10/23/2024 0639     Critical care time: NA    Leita SHAUNNA Gaskins, DO 10/23/2024 2:56 PM [facility]  For contact information, see Amion. If no response to pager, please call PCCM 2H APP. After hours, 7PM- 7AM, please call on call APP for 2H.     "

## 2024-10-23 NOTE — Progress Notes (Signed)
 PHARMACY - ANTICOAGULATION CONSULT NOTE  Pharmacy Consult for warfarin Indication: LVAD HM3 + hx AFib  Allergies[1]  Patient Measurements: Height: 6' 6 (198.1 cm) Weight: 101.7 kg (224 lb 3.3 oz) IBW/kg (Calculated) : 91.4 HEPARIN  DW (KG): 108.7  Vital Signs: Temp: 98.4 F (36.9 C) (01/09 0754) Temp Source: Oral (01/09 0754) BP: 80/63 (01/09 0735) Pulse Rate: 122 (01/09 0735)  Labs: Recent Labs    10/21/24 0433 10/21/24 0447 10/22/24 0421 10/22/24 0439 10/22/24 1335 10/23/24 0432 10/23/24 0626 10/23/24 0639  HGB 8.6*   < > 8.2*   < >  --  8.6* 8.8* 8.8*  HCT 25.2*   < > 25.1*   < >  --  25.9* 26.0* 26.0*  PLT 190  --  174  --   --  167  --   --   LABPROT 20.8*  --  23.7*  --   --  24.6*  --   --   INR 1.7*  --  2.0*  --   --  2.1*  --   --   CREATININE 1.54*   < > 1.40*  --  1.51* 1.33*  --   --    < > = values in this interval not displayed.    Estimated Creatinine Clearance: 73.5 mL/min (A) (by C-G formula based on SCr of 1.33 mg/dL (H)).   Medical History: Past Medical History:  Diagnosis Date   CKD (chronic kidney disease) stage 2, GFR 60-89 ml/min    COPD (chronic obstructive pulmonary disease) (HCC)    Diabetes mellitus type II, non insulin  dependent (HCC)    Dilated aortic root    Elevated PSA    Hypercholesteremia    Hypertension    NICM (nonischemic cardiomyopathy) (HCC)    NSVT (nonsustained ventricular tachycardia) (HCC)    Obstructive sleep apnea    Paroxysmal atrial fibrillation (HCC)    Pulmonary hypertension (HCC)    PVC's (premature ventricular contractions)    Tobacco abuse     Medications:  Scheduled:   Chlorhexidine  Gluconate Cloth  6 each Topical Daily   digoxin   0.0625 mg Oral Daily   gabapentin   200 mg Oral QHS   insulin  aspart  0-15 Units Subcutaneous TID WC   insulin  aspart  0-5 Units Subcutaneous QHS   insulin  aspart  2 Units Subcutaneous TID WC   lactose free nutrition  237 mL Oral TID BM   melatonin  3 mg Oral QHS    mexiletine  200 mg Oral Q8H   pantoprazole   40 mg Oral Daily   potassium chloride   40 mEq Per Tube NOW   senna-docusate  2 tablet Oral QHS   sildenafil   20 mg Oral TID   sodium chloride  flush  10-40 mL Intracatheter Q12H   spironolactone   50 mg Oral Daily   Warfarin - Pharmacist Dosing Inpatient   Does not apply q1600    Assessment: 63 yom who underwent LVAD HM3 placement on 12/31. Was on apixaban  PTA for hx Afib > now with LVAD plan to change to warfarin.   INR is therapeutic at 2.1. Hgb 8.8, plt 167. LDH 294. No s/sx of bleeding. Received 2 boost nutritional supplements yesterday - eating all/most of ordered meals (however ordered meal size smaller in size). Still on amiodarone .   Goal of Therapy:  INR 2-2.5 Monitor platelets by anticoagulation protocol: Yes   Plan:  Warfarin 4 mg x1 orally tonight   Monitor daily INR, CBC, and for s/sx of bleeding  F/u PO  intake  Thank you for allowing pharmacy to participate in this patient's care,  Suzen Sour, PharmD, BCCCP Clinical Pharmacist  Phone: [phone] 10/23/2024 8:02 AM  Please check AMION for all [facility] Pharmacy phone numbers After 10:00 PM, call Main Pharmacy [phone]      [1] No Known Allergies

## 2024-10-23 NOTE — Progress Notes (Signed)
 H&V Care Navigation CSW Progress Note  Outpatient Heart Failure CSW attempted to meet with pt at bedside but sleeping at time of visit.  Per LVAD Coordinator patient would like to discuss disability options- CSW will plan to follow up with patient on Monday to discuss.  Will continue to follow during inpatient stay and assist as needed  Makoa Satz H. Vallie Fayette, LCSW Clinical Social Worker [facility] Desk#: [phone] Cell#: [phone]

## 2024-10-23 NOTE — Progress Notes (Signed)
" °  Echocardiogram 2D Echocardiogram has been performed.  James [name] 10/23/2024, 2:31 PM "

## 2024-10-23 NOTE — Progress Notes (Signed)
 Inpatient Rehab Admissions Coordinator:    CIR following at a distance. Still on cardiac drips, not ready for transfer to rehab at this time.   Leita Kleine, MS, CCC-SLP Rehab Admissions Coordinator  [phone] (celll) [phone] (office)

## 2024-10-23 NOTE — Progress Notes (Signed)
 Physical Therapy Treatment Patient Details Name: James [name] MRN: [id number] DOB: 07-Nov-1960 Today's Date: 10/23/2024   History of Present Illness Pt is a 64 yo male who was admitted 10/01/24 for pre-VAD optimization prior to planned surgery. S/p R heart cath 12/18 & 12/24, impella placement 12/23, impella removal and HM III LVAD placement 12/31. Extubated 10/15/24. Developed L sided weakness, CT negative for acute infarct. PMH: CKD IIIa, hx VT, PAF, prostate cancer, meningioma, COPD, DM2, HTN, hypercholesteremia, OSA, pulmonary hypertension, tobacco abuse.    PT Comments  Pt asleep on entry, rouses easily and to therapist surprise is eager to walk. Pt has no complaints of dizziness with positional change, still complains of L UE weakness but it is better than when PT saw him on Wednesday. Pt requires modAx2 for bed mobility. Once EoB had pt switch from wall power to batteries. Pt needs mod A for cuing once cords were disconnected pt had difficulty figuring out which cords to reconnect. Pt requires modAx2 for power up to standing in EVA walker and modAx2 to walk around the small block. D/c plans remain appropriate at this time. PT will continue to follow acutely.    If plan is discharge home, recommend the following: Two people to help with walking and/or transfers;Two people to help with bathing/dressing/bathroom;Assistance with cooking/housework;Direct supervision/assist for medications management;Direct supervision/assist for financial management;Assist for transportation;Help with stairs or ramp for entrance;Supervision due to cognitive status   Can travel by private vehicle      Yes  Equipment Recommendations  Rolling walker (2 wheels);BSC/3in1;Wheelchair (measurements PT);Wheelchair cushion (measurements PT) (pending progress; may progress to needing only a rollator and 3in1)    Recommendations for Other Services Rehab consult     Precautions / Restrictions Precautions Precautions:  Fall;Sternal;Other (comment) Precaution Booklet Issued: No Recall of Precautions/Restrictions: Impaired Precaution/Restrictions Comments: LVAD;  A-line Restrictions Weight Bearing Restrictions Per Provider Order: Yes RUE Weight Bearing Per Provider Order: Non weight bearing LUE Weight Bearing Per Provider Order: Non weight bearing Other Position/Activity Restrictions: sternal precautions     Mobility  Bed Mobility Overal bed mobility: Needs Assistance Bed Mobility: Rolling, Sit to Sidelying, Sidelying to Sit Rolling: Mod assist, +2 for safety/equipment (line managementg) Sidelying to sit: Mod assist, +2 for safety/equipment, HOB elevated     Sit to sidelying: Mod assist, +2 for physical assistance General bed mobility comments: requires modAx2 for trunk elevation/descent BLE back into bed    Transfers Overall transfer level: Needs assistance Equipment used: Ambulation equipment used Transfers: Sit to/from Stand Sit to Stand: +2 physical assistance, Mod assist, From elevated surface (+RN for management of lines)           General transfer comment: Pt using rocking momentum to achieve standing with mod A +2 boost and safety. Once on feet, min A +2 safety. greatly improved from last session    Ambulation/Gait Ambulation/Gait assistance: +2 safety/equipment, +2 physical assistance, Mod assist Gait Distance (Feet): 160 Feet Assistive device: Elyn Finder Gait Pattern/deviations: Decreased stride length, Knee flexed in stance - right, Knee flexed in stance - left, Trunk flexed, Step-through pattern, Knees buckling, Drifts right/left, Narrow base of support Gait velocity: reduced Gait velocity interpretation: <1.31 ft/sec, indicative of household ambulator   General Gait Details: Mod assist +2 for balance, eva walker control, and equipment management.pt needs cues for upright posture, and steady stepping, pt with mild unsteadiness through out ambulation.         Balance  Overall balance assessment: Needs assistance Sitting-balance support: No upper extremity  supported, Feet supported Sitting balance-Leahy Scale: Poor Sitting balance - Comments: requires min A or CGA for seated balance during power change over   Standing balance support: Bilateral upper extremity supported, During functional activity, Reliant on assistive device for balance Standing balance-Leahy Scale: Poor Standing balance comment: reliant on EVA walker                            Communication Communication Communication: No apparent difficulties  Cognition Arousal: Alert, Lethargic Behavior During Therapy: Flat affect   PT - Cognitive impairments: Awareness, Initiation, Problem solving, Safety/Judgement                       PT - Cognition Comments: pt with poor awareness of level of assist needed to maintain standing, Following commands: Impaired Following commands impaired: Follows one step commands with increased time, Follows multi-step commands with increased time    Cueing Cueing Techniques: Verbal cues, Tactile cues, Gestural cues     General Comments General comments (skin integrity, edema, etc.): set up patient for transition from wall power to battery power and back with return to room. Pt needs modA for choosing which cords go togther once they detatched for transition, pt reluctant to use BSC due to sore on his bottom, found bariatric BSC ant pt willing to try instead of bedpain next time      Pertinent Vitals/Pain Pain Assessment Pain Assessment: Faces Faces Pain Scale: Hurts a little bit Pain Location: chest/sacral wound Pain Descriptors / Indicators: Discomfort, Sore Pain Intervention(s): Limited activity within patient's tolerance, Monitored during session, Repositioned     PT Goals (current goals can now be found in the care plan section) Progress towards PT goals: Progressing toward goals    Frequency    Min 3X/week      PT  Plan      Co-evaluation PT/OT/SLP Co-Evaluation/Treatment: Yes Reason for Co-Treatment: Complexity of the patient's impairments (multi-system involvement);For patient/therapist safety;To address functional/ADL transfers PT goals addressed during session: Mobility/safety with mobility;Balance;Proper use of DME OT goals addressed during session: ADL's and self-care;Strengthening/ROM;Proper use of Adaptive equipment and DME      AM-PAC PT 6 Clicks Mobility   Outcome Measure  Help needed turning from your back to your side while in a flat bed without using bedrails?: A Lot Help needed moving from lying on your back to sitting on the side of a flat bed without using bedrails?: A Lot Help needed moving to and from a bed to a chair (including a wheelchair)?: Total Help needed standing up from a chair using your arms (e.g., wheelchair or bedside chair)?: Total Help needed to walk in hospital room?: Total Help needed climbing 3-5 steps with a railing? : Total 6 Click Score: 8    End of Session Equipment Utilized During Treatment: Gait belt Activity Tolerance: Patient tolerated treatment well Patient left: in bed;with call bell/phone within reach;with nursing/sitter in room Nurse Communication: Mobility status PT Visit Diagnosis: Unsteadiness on feet (R26.81);Other abnormalities of gait and mobility (R26.89);Muscle weakness (generalized) (M62.81);Difficulty in walking, not elsewhere classified (R26.2);Pain     Time: 1117-1204 PT Time Calculation (min) (ACUTE ONLY): 47 min  Charges:    $Gait Training: 8-22 mins PT General Charges $$ ACUTE PT VISIT: 1 Visit                     Bryant Lipps B. Van Fleet PT, DPT Acute Rehabilitation Services Please use secure  chat or  Call Office [phone]    Almarie KATHEE Salinas Fleet 10/23/2024, 2:05 PM

## 2024-10-23 NOTE — Progress Notes (Signed)
 Speed  Flow  PI  Power  LVIDD  AI  Aortic opening MR  TR  Septum  RV  VTI (>18cm)  5400 5.4 2.3 3.7 3.8 none Slight opening 5/5  none  mild Bowing right mod  8.1  5500  5.4 2.5 3.9 3.7 none Slight opening 5/5 none mild midline mod 5.7  5300  5.1 2.7 3.7 4.2 none Slight 5/5 none > mild Pulling in mod 5.68                                             Doppler MAP: 95 Arterial line: 76/60 (71)   Ramp ECHO performed at bedside per Dr.McLean  At completion of ramp study, patients primary controller programmed:  Fixed speed:5500 Low speed limit:5200   Schuyler Gladis PEAK, BSN VAD Coordinator 24/7 Pager [phone]

## 2024-10-24 ENCOUNTER — Encounter (HOSPITAL_COMMUNITY): Payer: Self-pay | Admitting: Cardiology

## 2024-10-24 DIAGNOSIS — L89159 Pressure ulcer of sacral region, unspecified stage: Secondary | ICD-10-CM | POA: Diagnosis not present

## 2024-10-24 DIAGNOSIS — E871 Hypo-osmolality and hyponatremia: Secondary | ICD-10-CM | POA: Diagnosis not present

## 2024-10-24 DIAGNOSIS — N1831 Chronic kidney disease, stage 3a: Secondary | ICD-10-CM | POA: Diagnosis not present

## 2024-10-24 DIAGNOSIS — G4700 Insomnia, unspecified: Secondary | ICD-10-CM | POA: Diagnosis not present

## 2024-10-24 DIAGNOSIS — I429 Cardiomyopathy, unspecified: Secondary | ICD-10-CM

## 2024-10-24 DIAGNOSIS — E876 Hypokalemia: Secondary | ICD-10-CM

## 2024-10-24 DIAGNOSIS — R197 Diarrhea, unspecified: Secondary | ICD-10-CM

## 2024-10-24 DIAGNOSIS — J9601 Acute respiratory failure with hypoxia: Secondary | ICD-10-CM | POA: Diagnosis not present

## 2024-10-24 DIAGNOSIS — D696 Thrombocytopenia, unspecified: Secondary | ICD-10-CM | POA: Diagnosis not present

## 2024-10-24 DIAGNOSIS — I480 Paroxysmal atrial fibrillation: Secondary | ICD-10-CM | POA: Diagnosis not present

## 2024-10-24 DIAGNOSIS — I5023 Acute on chronic systolic (congestive) heart failure: Secondary | ICD-10-CM | POA: Diagnosis not present

## 2024-10-24 DIAGNOSIS — E1165 Type 2 diabetes mellitus with hyperglycemia: Secondary | ICD-10-CM | POA: Diagnosis not present

## 2024-10-24 LAB — RENAL FUNCTION PANEL
Albumin: 2.9 g/dL — ABNORMAL LOW (ref 3.5–5.0)
Anion gap: 10 (ref 5–15)
BUN: 36 mg/dL — ABNORMAL HIGH (ref 8–23)
CO2: 31 mmol/L (ref 22–32)
Calcium: 8.5 mg/dL — ABNORMAL LOW (ref 8.9–10.3)
Chloride: 88 mmol/L — ABNORMAL LOW (ref 98–111)
Creatinine, Ser: 1.35 mg/dL — ABNORMAL HIGH (ref 0.61–1.24)
GFR, Estimated: 59 mL/min — ABNORMAL LOW
Glucose, Bld: 114 mg/dL — ABNORMAL HIGH (ref 70–99)
Phosphorus: 2.8 mg/dL (ref 2.5–4.6)
Potassium: 2.9 mmol/L — ABNORMAL LOW (ref 3.5–5.1)
Sodium: 129 mmol/L — ABNORMAL LOW (ref 135–145)

## 2024-10-24 LAB — COOXEMETRY PANEL
Carboxyhemoglobin: 3 % — ABNORMAL HIGH (ref 0.5–1.5)
Methemoglobin: <0.7 % (ref 0.0–1.5)
O2 Saturation: 64.6 %
Total hemoglobin: 8.9 g/dL — ABNORMAL LOW (ref 12.0–16.0)

## 2024-10-24 LAB — CBC WITH DIFFERENTIAL/PLATELET
Abs Immature Granulocytes: 0.11 K/uL — ABNORMAL HIGH (ref 0.00–0.07)
Basophils Absolute: 0 K/uL (ref 0.0–0.1)
Basophils Relative: 0 %
Eosinophils Absolute: 0.2 K/uL (ref 0.0–0.5)
Eosinophils Relative: 2 %
HCT: 25.9 % — ABNORMAL LOW (ref 39.0–52.0)
Hemoglobin: 8.6 g/dL — ABNORMAL LOW (ref 13.0–17.0)
Immature Granulocytes: 1 %
Lymphocytes Relative: 5 %
Lymphs Abs: 0.5 K/uL — ABNORMAL LOW (ref 0.7–4.0)
MCH: 31 pg (ref 26.0–34.0)
MCHC: 33.2 g/dL (ref 30.0–36.0)
MCV: 93.5 fL (ref 80.0–100.0)
Monocytes Absolute: 0.6 K/uL (ref 0.1–1.0)
Monocytes Relative: 7 %
Neutro Abs: 8.2 K/uL — ABNORMAL HIGH (ref 1.7–7.7)
Neutrophils Relative %: 85 %
Platelets: 163 K/uL (ref 150–400)
RBC: 2.77 MIL/uL — ABNORMAL LOW (ref 4.22–5.81)
RDW: 18.6 % — ABNORMAL HIGH (ref 11.5–15.5)
WBC: 9.6 K/uL (ref 4.0–10.5)
nRBC: 0.2 % (ref 0.0–0.2)

## 2024-10-24 LAB — GLUCOSE, CAPILLARY
Glucose-Capillary: 128 mg/dL — ABNORMAL HIGH (ref 70–99)
Glucose-Capillary: 145 mg/dL — ABNORMAL HIGH (ref 70–99)
Glucose-Capillary: 215 mg/dL — ABNORMAL HIGH (ref 70–99)
Glucose-Capillary: 147 mg/dL — ABNORMAL HIGH (ref 70–99)
Glucose-Capillary: 152 mg/dL — ABNORMAL HIGH (ref 70–99)

## 2024-10-24 LAB — POTASSIUM: Potassium: 4.4 mmol/L (ref 3.5–5.1)

## 2024-10-24 LAB — PROTIME-INR
INR: 2.3 — ABNORMAL HIGH (ref 0.8–1.2)
Prothrombin Time: 26.4 s — ABNORMAL HIGH (ref 11.4–15.2)

## 2024-10-24 LAB — LACTATE DEHYDROGENASE: LDH: 307 U/L — ABNORMAL HIGH (ref 105–235)

## 2024-10-24 LAB — MAGNESIUM: Magnesium: 1.9 mg/dL (ref 1.7–2.4)

## 2024-10-24 MED ORDER — INSULIN ASPART 100 UNIT/ML IJ SOLN
4.0000 [IU] | Freq: Three times a day (TID) | INTRAMUSCULAR | Status: DC
  Administered 2024-10-24 – 2024-10-25 (×3): 4 [IU] via SUBCUTANEOUS
  Filled 2024-10-24 (×3): qty 4

## 2024-10-24 MED ORDER — POTASSIUM CHLORIDE CRYS ER 20 MEQ PO TBCR: 40.0000 meq | EXTENDED_RELEASE_TABLET | ORAL | Status: DC

## 2024-10-24 MED ORDER — POTASSIUM CHLORIDE 10 MEQ/100ML IV SOLN
INTRAVENOUS | Status: AC
  Administered 2024-10-24: 10 meq via INTRAVENOUS
  Filled 2024-10-24: qty 100

## 2024-10-24 MED ORDER — POTASSIUM CHLORIDE 10 MEQ/100ML IV SOLN FOR RAPID INFUSION
10.0000 meq | INTRAVENOUS | Status: AC
  Administered 2024-10-24 (×5): 10 meq via INTRAVENOUS
  Filled 2024-10-24 (×6): qty 100

## 2024-10-24 MED ORDER — CALCIUM POLYCARBOPHIL 625 MG PO TABS
625.0000 mg | ORAL_TABLET | Freq: Every day | ORAL | Status: DC
  Administered 2024-10-24 – 2024-10-26 (×3): 625 mg via ORAL
  Filled 2024-10-24 (×4): qty 1

## 2024-10-24 MED ORDER — POTASSIUM CHLORIDE 20 MEQ PO PACK
40.0000 meq | PACK | ORAL | Status: AC
  Administered 2024-10-24 (×2): 40 meq via ORAL
  Filled 2024-10-24 (×2): qty 2

## 2024-10-24 MED ORDER — POTASSIUM CHLORIDE CRYS ER 20 MEQ PO TBCR
40.0000 meq | EXTENDED_RELEASE_TABLET | ORAL | Status: DC
  Filled 2024-10-24: qty 2

## 2024-10-24 MED ORDER — WARFARIN SODIUM 2 MG PO TABS
2.0000 mg | ORAL_TABLET | Freq: Once | ORAL | Status: AC
  Administered 2024-10-24: 2 mg via ORAL
  Filled 2024-10-24: qty 1

## 2024-10-24 MED ORDER — MAGNESIUM SULFATE 2 GM/50ML IV SOLN
2.0000 g | Freq: Once | INTRAVENOUS | Status: AC
  Administered 2024-10-24: 2 g via INTRAVENOUS
  Filled 2024-10-24: qty 50

## 2024-10-24 NOTE — Plan of Care (Signed)
  Problem: Activity: Goal: Ability to return to baseline activity level will improve Outcome: Progressing   Problem: Activity: Goal: Risk for activity intolerance will decrease Outcome: Progressing   Problem: Nutrition: Goal: Adequate nutrition will be maintained Outcome: Progressing

## 2024-10-24 NOTE — Progress Notes (Signed)
 "  NAME:  James [name], MRN:  [id number], DOB:  03-May-1961, LOS: 23 ADMISSION DATE:  10/01/2024, CONSULTATION DATE:  10/14/24 REFERRING MD:  Lucas , CHIEF COMPLAINT:  s/p LVAD   History of Present Illness:  64 yo M with stg D cardiomyopathy + inotrope dependence, CKD 3a, hx VT, pAFib, meningioma who was admitted to HF service 10/01/24 for pre-VAD optimization  This included: RHC, milrinone  titration, impella placement, levo and DBA titration and volume optimization  On 10/14/24 he went for LVAD  Pump: 116 min  EBL: 750 Product: 2 plt 4 ffp 708 cellsaver  PCCM is consulted post op ICU management.  Pertinent  Medical History  HFrEF Inotrope dependence VT pAFib  Prostate cancer Meningioma  HTN DM2   Significant Hospital Events: Including procedures, antibiotic start and stop dates in addition to other pertinent events    10/01/24 RHC w low output and pHTN, and subsequent uptitration of milrinone  from 0.25 to 0.5 10/02/24 started ND 10/05/24 high PA pressures, hignPCWP high SVR. 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 Hep to bival 10/13/24 not likely HIT. Milrinone  to 0.375  10/14/24 LVAD. PCCM consult.  10/15/24 extubated, nitric off, 1u prbc, lasix  10/20/24: [name] is out 1/8- Off epi 1/9 milrinone  decreased  Interim History / Subjective:   Remains on low dose amiodarone  and milrinone  0.144mcg. Eating minimally due to not liking the food, but willing to try other things and has an appetite. Walking some-- walked 1-2 laps the past few days.  Objective    Blood pressure (!) 78/62, pulse 100, temperature (!) 97.4 F (36.3 C), temperature source Axillary, resp. rate 19, height 6' 6 (1.981 m), weight 100.6 kg, SpO2 97%. CVP:  [5 mmHg-13 mmHg] 12 mmHg      Intake/Output Summary (Last 24 hours) at 10/24/2024 1143 Last data filed at 10/24/2024  1100 Gross per 24 hour  Intake 1002.61 ml  Output 2400 ml  Net -1397.39 ml   Filed Weights   10/22/24 0712 10/23/24 0500 10/24/24 9386  Weight: 99.9 kg 101.7 kg 100.6 kg    Examination: Physical exam: General: elderly man lying in bed in NAD HEENT: [place]/AT, eyes anicteric Neuro: awake, alert, answering questions appropriately  Chest: breathing comfortably on RA, CTAB heart: mechanical hum of VAD Abdomen: soft, NT  Milrinone  0.14mcg Amio 30 mg  coox 65% Sodium 129 Potassium 2.9 BUN 36 Creatinine 1.35 WBC 9.6 H/H 8.6/25.9 Platelets 163  Resolved problem list   Leukocytosis  Assessment and Plan   Acute on chronic HFrEF, stage D cardiomyopathy s/p LVAD (HM3 10/14/24)  pHTN, acute on chronic RV failure   PVCs, NSVT  pAFib  -Post-op care per TCTS  -echo ramp study 1/9-- speed increased to 5500 RPM -coumadin  dosing per pharmacy -con't amiodarone  gtt and mexilitine -con't dig, spiro, torsemide  -requires high K+ repletion -con't sildenafil  for PH -con't efforts to progress mobility -- told him he should walk 3-4 times daily; this is a big barrier to safe discharge -gabapentin , tramadol  & oxy PRN -encouraged PO intake - liberalized diet, family can bring food in  Acute hypoxic respiratory failure 2/2 pulmonary edema, deconditioning -  Resolved -remains on RA, pulmonary hygiene  LUE swelling improving; no DVT on 1/2 -monitor  Hypokalemia -replete aggressively -con't to monitor   CKD 3a -strict I/O -renally dose meds, avoid nephrotoxic meds  Hyponatremia  -diuresis -needs to increase PO intake; minimize hypotonic fluids -  family can bring food from home to encourage PO intake  DM2 w hyperglycemia -uncontrolled and labile -glargine 12 units seems appropriate -mealtime insulin  increased to 4 units TIDAC -SSI RPN -goal BG 140-180  Anemia; expected operative blood loss.  Thrombocytopenia; not HIT -transfuse for Hb <7 or hemodynamically significant  bleeding  Deconditioning -needs CIR eventually, con't PT & OT; encouraged to walk 3-4 times daily  Diarrhea -adding fiber -liberalized diet -avoid bowel regimen for now  Insomnia -con't melatonin, trazodone   Decubitus ulcer sacrum -needs to be OOB more, frequent turns   -ambulate  Wife updated with patient at bedside.     Labs   CBC: Recent Labs  Lab 10/20/24 0426 10/20/24 0454 10/21/24 0433 10/21/24 0447 10/22/24 0421 10/22/24 0439 10/23/24 0432 10/23/24 0626 10/23/24 0639 10/24/24 0600  WBC 9.8  --  11.1*  --  11.3*  --  10.7*  --   --  9.6  NEUTROABS 8.4*  --  9.6*  --  10.0*  --  9.4*  --   --  8.2*  HGB 8.7*   < > 8.6*   < > 8.2* 9.2* 8.6* 8.8* 8.8* 8.6*  HCT 25.7*   < > 25.2*   < > 25.1* 27.0* 25.9* 26.0* 26.0* 25.9*  MCV 91.5  --  91.6  --  95.1  --  93.8  --   --  93.5  PLT 205  --  190  --  174  --  167  --   --  163   < > = values in this interval not displayed.    Basic Metabolic Panel: Recent Labs  Lab 10/20/24 0426 10/20/24 0454 10/21/24 0433 10/21/24 0447 10/22/24 0421 10/22/24 0439 10/22/24 1335 10/23/24 0432 10/23/24 0626 10/23/24 0639 10/23/24 1429 10/24/24 0600  NA 130*   < > 131*   < > 128*   < > 127* 129* 128* 128* 129* 129*  K 3.2*   < > 3.4*   < > 2.9*   < > 4.8 3.0* 3.2* 2.7* 3.9 2.9*  CL 87*   < > 89*   < > 87*  --  88* 89*  --   --  90* 88*  CO2 32   < > 32   < > 30  --  27 28  --   --  28 31  GLUCOSE 220*   < > 167*   < > 216*  --  138* 97  --   --  185* 114*  BUN 39*   < > 39*   < > 38*  --  42* 38*  --   --  38* 36*  CREATININE 1.67*   < > 1.54*   < > 1.40*  --  1.51* 1.33*  --   --  1.44* 1.35*  CALCIUM  8.8*   < > 8.8*   < > 8.6*  --  8.9 8.4*  --   --  8.5* 8.5*  MG 2.0  --  2.1  --  1.8  --   --  2.0  --   --   --  1.9  PHOS 2.5  --  2.7  --  3.1  --   --  3.0  --   --   --  2.8   < > = values in this interval not displayed.   GFR: Estimated Creatinine Clearance: 72.4 mL/min (A) (by C-G formula based on SCr of 1.35  mg/dL (H)). Recent Labs  Lab 10/21/24  9566 10/22/24 0421 10/23/24 0432 10/24/24 0600  WBC 11.1* 11.3* 10.7* 9.6    Liver Function Tests: Recent Labs  Lab 10/21/24 0433 10/22/24 0421 10/23/24 0432 10/24/24 0600  ALBUMIN  3.2* 3.0* 2.9* 2.9*     ABG    Component Value Date/Time   PHART 7.541 (H) 10/23/2024 0639   PCO2ART 36.6 10/23/2024 0639   PO2ART 65 (L) 10/23/2024 0639   HCO3 31.5 (H) 10/23/2024 0639   TCO2 33 (H) 10/23/2024 0639   ACIDBASEDEF 1.0 10/17/2024 0438   O2SAT 64.6 10/24/2024 0609     Critical care time:      Leita SHAUNNA Gaskins, DO 10/24/2024 12:54 PM [facility]  For contact information, see Amion. If no response to pager, please call PCCM 2H APP. After hours, 7PM- 7AM, please call on call APP for 2H.     "

## 2024-10-24 NOTE — Progress Notes (Signed)
 PHARMACY - ANTICOAGULATION CONSULT NOTE  Pharmacy Consult for warfarin Indication: LVAD HM3 + hx AFib  Allergies[1]  Patient Measurements: Height: 6' 6 (198.1 cm) Weight: 100.6 kg (221 lb 12.5 oz) IBW/kg (Calculated) : 91.4 HEPARIN  DW (KG): 108.7  Vital Signs: Temp: 97.4 F (36.3 C) (01/10 0800) Temp Source: Axillary (01/10 0800) BP: 78/61 (01/10 0800) Pulse Rate: 129 (01/10 0800)  Labs: Recent Labs    10/22/24 0421 10/22/24 0439 10/23/24 0432 10/23/24 0626 10/23/24 0639 10/23/24 1429 10/24/24 0600  HGB 8.2*   < > 8.6* 8.8* 8.8*  --  8.6*  HCT 25.1*   < > 25.9* 26.0* 26.0*  --  25.9*  PLT 174  --  167  --   --   --  163  LABPROT 23.7*  --  24.6*  --   --   --  26.4*  INR 2.0*  --  2.1*  --   --   --  2.3*  CREATININE 1.40*   < > 1.33*  --   --  1.44* 1.35*   < > = values in this interval not displayed.    Estimated Creatinine Clearance: 72.4 mL/min (A) (by C-G formula based on SCr of 1.35 mg/dL (H)).   Medical History: Past Medical History:  Diagnosis Date   CKD (chronic kidney disease) stage 2, GFR 60-89 ml/min    COPD (chronic obstructive pulmonary disease) (HCC)    Diabetes mellitus type II, non insulin  dependent (HCC)    Dilated aortic root    Elevated PSA    Hypercholesteremia    Hypertension    NICM (nonischemic cardiomyopathy) (HCC)    NSVT (nonsustained ventricular tachycardia) (HCC)    Obstructive sleep apnea    Paroxysmal atrial fibrillation (HCC)    Pulmonary hypertension (HCC)    PVC's (premature ventricular contractions)    Tobacco abuse     Medications:  Scheduled:   Chlorhexidine  Gluconate Cloth  6 each Topical Daily   digoxin   0.0625 mg Oral Daily   gabapentin   200 mg Oral QHS   insulin  aspart  0-15 Units Subcutaneous TID WC   insulin  aspart  0-5 Units Subcutaneous QHS   insulin  aspart  2 Units Subcutaneous TID WC   insulin  glargine  12 Units Subcutaneous Daily   lactose free nutrition  237 mL Oral TID BM   melatonin  3 mg Oral  QHS   mexiletine  200 mg Oral Q8H   pantoprazole   40 mg Oral Daily   potassium chloride   40 mEq Oral Q4H   senna-docusate  2 tablet Oral QHS   sildenafil   20 mg Oral TID   sodium chloride  flush  10-40 mL Intracatheter Q12H   spironolactone   50 mg Oral Daily   torsemide   40 mg Oral Daily   Warfarin - Pharmacist Dosing Inpatient   Does not apply q1600    Assessment: 63 yom who underwent LVAD HM3 placement on 12/31. Was on apixaban  PTA for hx Afib > now with LVAD plan to change to warfarin.   INR is therapeutic at 2.3. Hgb 8.6, plt 1673 LDH 307. No s/sx of bleeding. Received 2 boost nutritional supplements yesterday - eating all/most of ordered meals, liberalizing diet today. Encouraged to eat more nutritional supplements - trying the vanilla flavor. Still on amiodarone  IV.   Goal of Therapy:  INR 2-2.5 Monitor platelets by anticoagulation protocol: Yes   Plan:  Warfarin 2 mg x1 orally tonight   Monitor daily INR, CBC, and for s/sx of bleeding  F/u PO intake  Thank you for allowing pharmacy to participate in this patient's care,  Maurilio Fila, PharmD Clinical Pharmacist 10/24/2024  3:10 PM     [1] No Known Allergies

## 2024-10-24 NOTE — Progress Notes (Signed)
 Patient ID: James [name], male   DOB: April 27, 1961, 64 y.o.   MRN: [id number]     Advanced Heart Failure Rounding Note   [facility] Cardiologist: Dr. Zenaida Chief Complaint: Post-op LVAD Patient Profile   James [name] is a 64 y.o. male with history of stage D cardiomyopathy/chronic systolic heart failure now end-stage w/ inotrope dependence, CKD IIIa, hx VT, PAF, prostate cancer, meningioma.   Admitted for LVAD implant/ pre-VAD HF optimization.   Significant events:   12/18: Milrinone  titrated from 0.25 to 0.5 mcg/kg/min post RHC d/t low-output and pulmonary hypertension 12/19: NE added 12/22: Markedly elevated PA pressures, elevated PCWP and elevated SVR. Titrated off NE. Added DBA.  12/23: Impella 5.5 Implanted.  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 Heparin  > Bival.  12/31: HM III LVAD implant 1/5: LVAD speed increased to 5400  1/6: LUE weakness, head CT negative  1/9: Ramp echo, speed increased to 5500 rpm  Subjective:    S/P HM3 LVAD 10/14/24.  Co-ox 65%. On milrinone  0.125. Off Epi. CVP 9.  I/Os net negative 1731 cc, weight down 3 lbs.  K remains low at 2.9.   More motivated to exercise.  Walked yesterday, has not walked yet today.   He remains in AF 90s-100s on amiodarone  gtt 30 mg/hr.  INR 2.3 today.    LVAD INTERROGATION:  HeartMate IIl LVAD:  Flow 5.7 liters/min, speed 5500, power 4, PI 1.9. 4 PI events.   Objective:    Weight Range: 100.6 kg Body mass index is 25.63 kg/m.   Vital Signs:   Temp:  [97.4 F (36.3 C)-98.4 F (36.9 C)] 97.4 F (36.3 C) (01/10 0800) Pulse Rate:  [104-177] 129 (01/10 0800) Resp:  [8-29] 8 (01/10 0800) BP: (64-154)/(11-83) 78/61 (01/10 0800) SpO2:  [88 %-100 %] 95 % (01/10 0800) Arterial Line BP: (72-105)/(61-90) 79/65 (01/09 1600) Weight:  [100.6 kg] 100.6 kg (01/10 0613) Last BM Date : 10/24/24  Doppler MAP: 70s  Weight change: Filed Weights   10/22/24 0712 10/23/24 0500 10/24/24 0613  Weight: 99.9 kg 101.7 kg 100.6 kg    Intake/Output:  Intake/Output Summary (Last 24 hours) at 10/24/2024 0914 Last data filed at 10/24/2024 0800 Gross per 24 hour  Intake 816.75 ml  Output 2600 ml  Net -1783.25 ml    Physical Exam   General: Well appearing this am. NAD.  HEENT: Normal. Neck: Supple, JVP 8 cm. Carotids OK.  Cardiac:  Mechanical heart sounds with LVAD hum present.  Lungs:  CTAB, normal effort.  Abdomen:  NT, ND, no HSM. No bruits or masses. +BS  LVAD exit site: Well-healed and incorporated. Dressing dry and intact. No erythema or drainage. Stabilization device present and accurately applied. Driveline dressing changed daily per sterile technique. Extremities:  Warm and dry. No cyanosis, clubbing, rash, or edema.  Neuro:  Alert & oriented x 3. Cranial nerves grossly intact. Moves all 4 extremities w/o difficulty. Affect pleasant    Telemetry   Afib 90s-100s (personally reviewed)  Labs   CBC Recent Labs    10/23/24 0432 10/23/24 0626 10/23/24 0639 10/24/24 0600  WBC 10.7*  --   --  9.6  NEUTROABS 9.4*  --   --  8.2*  HGB 8.6*   < > 8.8* 8.6*  HCT 25.9*   < > 26.0* 25.9*  MCV 93.8  --   --  93.5  PLT 167  --   --  163   < > = values in this interval not displayed.   Basic Metabolic  Panel Recent Labs    10/23/24 0432 10/23/24 0626 10/23/24 1429 10/24/24 0600  NA 129*   < > 129* 129*  K 3.0*   < > 3.9 2.9*  CL 89*  --  90* 88*  CO2 28  --  28 31  GLUCOSE 97  --  185* 114*  BUN 38*  --  38* 36*  CREATININE 1.33*  --  1.44* 1.35*  CALCIUM  8.4*  --  8.5* 8.5*  MG 2.0  --   --  1.9  PHOS 3.0  --   --  2.8   < > = values in this interval not displayed.   Liver Function Tests Recent Labs    10/23/24 0432 10/24/24 0600  ALBUMIN  2.9* 2.9*   BNP (last 3 results) Recent Labs    06/10/24 1213 07/09/24 1649 07/23/24 0959  BNP 1,071.5* 3,316.0* 595.8*   ProBNP (last 3 results) Recent Labs    10/01/24 1030 10/15/24 0400 10/21/24 0433  PROBNP 8,374.0* 9,508.0* 5,277.0*    Medications:    Scheduled Medications:  Chlorhexidine  Gluconate Cloth  6 each Topical Daily   digoxin   0.0625 mg Oral Daily   gabapentin   200 mg Oral QHS   insulin  aspart  0-15 Units Subcutaneous TID WC   insulin  aspart  0-5 Units Subcutaneous QHS   insulin  aspart  2 Units Subcutaneous TID WC   insulin  glargine  12 Units Subcutaneous Daily   lactose free nutrition  237 mL Oral TID BM   melatonin  3 mg Oral QHS   mexiletine  200 mg Oral Q8H   pantoprazole   40 mg Oral Daily   potassium chloride   40 mEq Oral Q4H   sildenafil   20 mg Oral TID   sodium chloride  flush  10-40 mL Intracatheter Q12H   spironolactone   50 mg Oral Daily   torsemide   40 mg Oral Daily   Warfarin - Pharmacist Dosing Inpatient   Does not apply q1600    Infusions:  amiodarone  30 mg/hr (10/24/24 0800)   magnesium  sulfate bolus IVPB     potassium chloride       PRN Medications: dextrose , guaiFENesin -dextromethorphan , ondansetron  (ZOFRAN ) IV, mouth rinse, oxyCODONE , phenol, polyethylene glycol, senna, traMADol , traZODone   Assessment/Plan   Acute on chronic systolic CHF, NYHA IV: Nonischemic cardiomyopathy, planned LVAD. RHC 12/18 showed elevated filling pressures with mod-severe mixed pulmonary arterial/pulmonary venous hypertension and low CO despite milrinone  0.25, increased to 0.5. Impella 5.5 placed 12/23 for further optimization. - S/p HM III LVAD implant by Dr. Lucas 10/14/24 - iNO off 1/1 - Ramp echo 1/9, increased speed to 5500 rpm.  - Co-ox 65%. Currently on 0.125 milrinone . Stop today.  - CVP 9, weight trending down.  Continue torsemide  40 mg daily.  - Needs aggressive K replacement.  - Continue sildenafil  20mg  TID for RV support - Continue digoxin  0.0625 mg daily - Continue spironolactone  50 mg daily  Pulmonary hypertension: Severe combined pre/post capillary PH on RHC, suspect largely due to venous remodeling. Improved with adequate unloading.  AKI on CKD stage 3: Creatinine stable 1.35 -  follow BMP   PVCs/NSVT: Improved. Has Biotronik ICD. - Continue IV amio 30/hr. - Continue mexiletine 200 tid - Tachy therapies off for VAD implant. Turn back on prior to d/c   Persistantl Atrial fibrillation: Remains in AF rate 90s-100s. - Continue amiodarone  gtt 30 mg/hr.  - aggressive K and Mg supp  - Warfarin with INR 2.3 today.  - Stop milrinone  today and will plan TEE-guided DCCV on  Monday.   Meningioma: Monitoring.   Prostate cancer: Treated with radiation.   Thrombocytopenia: suspect consumptive with impella.  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 Stable    Hypokalemia/ Hypomagnesemia  - aggressive K and Mg supp   - Continue spironolactone  50 mg daily.   Deconditioning - continue PT/OT - CIR team following   Continue to mobilize, needs to walk and eat. Seems more open to increasing activity.   CRITICAL CARE Performed by: Ezra Shuck  Total critical care time: 35 minutes  Critical care time was exclusive of separately billable procedures and treating other patients.  Critical care was necessary to treat or prevent imminent or life-threatening deterioration.  Critical care was time spent personally by me on the following activities: development of treatment plan with patient and/or surrogate as well as nursing, discussions with consultants, evaluation of patient's response to treatment, examination of patient, obtaining history from patient or surrogate, ordering and performing treatments and interventions, ordering and review of laboratory studies, ordering and review of radiographic studies, pulse oximetry and re-evaluation of patient's condition.  Ezra Shuck 10/24/2024 9:14 AM

## 2024-10-25 DIAGNOSIS — I429 Cardiomyopathy, unspecified: Secondary | ICD-10-CM | POA: Diagnosis not present

## 2024-10-25 DIAGNOSIS — J9601 Acute respiratory failure with hypoxia: Secondary | ICD-10-CM | POA: Diagnosis not present

## 2024-10-25 DIAGNOSIS — I480 Paroxysmal atrial fibrillation: Secondary | ICD-10-CM | POA: Diagnosis not present

## 2024-10-25 DIAGNOSIS — I5023 Acute on chronic systolic (congestive) heart failure: Secondary | ICD-10-CM | POA: Diagnosis not present

## 2024-10-25 LAB — CBC WITH DIFFERENTIAL/PLATELET
Abs Immature Granulocytes: 0.11 K/uL — ABNORMAL HIGH (ref 0.00–0.07)
Basophils Absolute: 0 K/uL (ref 0.0–0.1)
Basophils Relative: 0 %
Eosinophils Absolute: 0.1 K/uL (ref 0.0–0.5)
Eosinophils Relative: 1 %
HCT: 28.5 % — ABNORMAL LOW (ref 39.0–52.0)
Hemoglobin: 9 g/dL — ABNORMAL LOW (ref 13.0–17.0)
Immature Granulocytes: 1 %
Lymphocytes Relative: 3 %
Lymphs Abs: 0.3 K/uL — ABNORMAL LOW (ref 0.7–4.0)
MCH: 30.5 pg (ref 26.0–34.0)
MCHC: 31.6 g/dL (ref 30.0–36.0)
MCV: 96.6 fL (ref 80.0–100.0)
Monocytes Absolute: 0.8 K/uL (ref 0.1–1.0)
Monocytes Relative: 6 %
Neutro Abs: 10.5 K/uL — ABNORMAL HIGH (ref 1.7–7.7)
Neutrophils Relative %: 89 %
Platelets: 154 K/uL (ref 150–400)
RBC: 2.95 MIL/uL — ABNORMAL LOW (ref 4.22–5.81)
RDW: 19 % — ABNORMAL HIGH (ref 11.5–15.5)
WBC: 11.8 K/uL — ABNORMAL HIGH (ref 4.0–10.5)
nRBC: 0.2 % (ref 0.0–0.2)

## 2024-10-25 LAB — RENAL FUNCTION PANEL
Albumin: 2.9 g/dL — ABNORMAL LOW (ref 3.5–5.0)
Albumin: 3 g/dL — ABNORMAL LOW (ref 3.5–5.0)
Anion gap: 10 (ref 5–15)
Anion gap: 9 (ref 5–15)
BUN: 33 mg/dL — ABNORMAL HIGH (ref 8–23)
BUN: 29 mg/dL — ABNORMAL HIGH (ref 8–23)
CO2: 30 mmol/L (ref 22–32)
CO2: 30 mmol/L (ref 22–32)
Calcium: 8.4 mg/dL — ABNORMAL LOW (ref 8.9–10.3)
Calcium: 8.2 mg/dL — ABNORMAL LOW (ref 8.9–10.3)
Chloride: 89 mmol/L — ABNORMAL LOW (ref 98–111)
Chloride: 87 mmol/L — ABNORMAL LOW (ref 98–111)
Creatinine, Ser: 1.44 mg/dL — ABNORMAL HIGH (ref 0.61–1.24)
Creatinine, Ser: 1.27 mg/dL — ABNORMAL HIGH (ref 0.61–1.24)
GFR, Estimated: 55 mL/min — ABNORMAL LOW
GFR, Estimated: >60 mL/min
Glucose, Bld: 146 mg/dL — ABNORMAL HIGH (ref 70–99)
Glucose, Bld: 189 mg/dL — ABNORMAL HIGH (ref 70–99)
Phosphorus: 2.5 mg/dL (ref 2.5–4.6)
Phosphorus: 2.5 mg/dL (ref 2.5–4.6)
Potassium: 3.4 mmol/L — ABNORMAL LOW (ref 3.5–5.1)
Potassium: 4.7 mmol/L (ref 3.5–5.1)
Sodium: 129 mmol/L — ABNORMAL LOW (ref 135–145)
Sodium: 126 mmol/L — ABNORMAL LOW (ref 135–145)

## 2024-10-25 LAB — COOXEMETRY PANEL
Carboxyhemoglobin: 2.8 % — ABNORMAL HIGH (ref 0.5–1.5)
Methemoglobin: <0.7 % (ref 0.0–1.5)
O2 Saturation: 60.7 %
Total hemoglobin: 9.2 g/dL — ABNORMAL LOW (ref 12.0–16.0)

## 2024-10-25 LAB — PROTIME-INR
INR: 2.4 — ABNORMAL HIGH (ref 0.8–1.2)
Prothrombin Time: 27.6 s — ABNORMAL HIGH (ref 11.4–15.2)

## 2024-10-25 LAB — GLUCOSE, CAPILLARY
Glucose-Capillary: 119 mg/dL — ABNORMAL HIGH (ref 70–99)
Glucose-Capillary: 148 mg/dL — ABNORMAL HIGH (ref 70–99)
Glucose-Capillary: 138 mg/dL — ABNORMAL HIGH (ref 70–99)
Glucose-Capillary: 192 mg/dL — ABNORMAL HIGH (ref 70–99)

## 2024-10-25 LAB — MAGNESIUM: Magnesium: 1.9 mg/dL (ref 1.7–2.4)

## 2024-10-25 LAB — LACTATE DEHYDROGENASE: LDH: 353 U/L — ABNORMAL HIGH (ref 105–235)

## 2024-10-25 MED ORDER — MAGNESIUM SULFATE 2 GM/50ML IV SOLN
2.0000 g | Freq: Once | INTRAVENOUS | Status: AC
  Administered 2024-10-25: 2 g via INTRAVENOUS
  Filled 2024-10-25: qty 50

## 2024-10-25 MED ORDER — POTASSIUM CHLORIDE 20 MEQ PO PACK
40.0000 meq | PACK | ORAL | Status: AC
  Administered 2024-10-25 (×2): 40 meq via ORAL
  Filled 2024-10-25 (×2): qty 2

## 2024-10-25 MED ORDER — TORSEMIDE 20 MG PO TABS: 40.0000 mg | ORAL_TABLET | Freq: Two times a day (BID) | ORAL | Status: DC

## 2024-10-25 MED ORDER — MEXILETINE HCL 150 MG PO CAPS
150.0000 mg | ORAL_CAPSULE | Freq: Two times a day (BID) | ORAL | Status: DC
  Administered 2024-10-25 – 2024-11-02 (×16): 150 mg via ORAL
  Filled 2024-10-25 (×17): qty 1

## 2024-10-25 MED ORDER — WARFARIN SODIUM 2 MG PO TABS
4.0000 mg | ORAL_TABLET | Freq: Once | ORAL | Status: AC
  Administered 2024-10-25: 4 mg via ORAL
  Filled 2024-10-25: qty 2

## 2024-10-25 MED ORDER — TORSEMIDE 20 MG PO TABS
40.0000 mg | ORAL_TABLET | Freq: Two times a day (BID) | ORAL | Status: DC
  Administered 2024-10-25 (×2): 40 mg via ORAL
  Filled 2024-10-25: qty 2

## 2024-10-25 MED ORDER — MEXILETINE HCL 200 MG PO CAPS: 200.0000 mg | ORAL_CAPSULE | Freq: Two times a day (BID) | ORAL | Status: DC

## 2024-10-25 MED ORDER — POTASSIUM CHLORIDE 10 MEQ/100ML IV SOLN FOR RAPID INFUSION
10.0000 meq | INTRAVENOUS | Status: AC
  Administered 2024-10-25 (×4): 10 meq via INTRAVENOUS
  Filled 2024-10-25 (×4): qty 100

## 2024-10-25 NOTE — Progress Notes (Signed)
 "  NAME:  James [name], MRN:  [id number], DOB:  April 11, 1961, LOS: 24 ADMISSION DATE:  10/01/2024, CONSULTATION DATE:  10/14/24 REFERRING MD:  Lucas , CHIEF COMPLAINT:  s/p LVAD   History of Present Illness:  64 yo M with stg D cardiomyopathy + inotrope dependence, CKD 3a, hx VT, pAFib, meningioma who was admitted to HF service 10/01/24 for pre-VAD optimization  This included: RHC, milrinone  titration, impella placement, levo and DBA titration and volume optimization  On 10/14/24 he went for LVAD  Pump: 116 min  EBL: 750 Product: 2 plt 4 ffp 708 cellsaver  PCCM is consulted post op ICU management.  Pertinent  Medical History  HFrEF Inotrope dependence VT pAFib  Prostate cancer Meningioma  HTN DM2   Significant Hospital Events: Including procedures, antibiotic start and stop dates in addition to other pertinent events    10/01/24 RHC w low output and pHTN, and subsequent uptitration of milrinone  from 0.25 to 0.5 10/02/24 started ND 10/05/24 high PA pressures, hignPCWP high SVR. 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 Hep to bival 10/13/24 not likely HIT. Milrinone  to 0.375  10/14/24 LVAD. PCCM consult.  10/15/24 extubated, nitric off, 1u prbc, lasix  10/20/24: [name] is out 1/8- Off epi 1/9 milrinone  decreased 1/10 milrinone  off  Interim History / Subjective:  Walked more yesterday-- legs felt very weak and he felt like he was going to collapse when he got back to his room. Has not eaten much more since his diet has been liberalized. No dizziness.  Objective    Blood pressure 91/73, pulse (!) 186, temperature 98.3 F (36.8 C), temperature source Axillary, resp. rate 17, height 6' 6 (1.981 m), weight 100.6 kg, SpO2 (!) 76%. CVP:  [14 mmHg-19 mmHg] 17 mmHg      Intake/Output Summary (Last 24 hours) at 10/25/2024 1748 Last data filed at  10/25/2024 0900 Gross per 24 hour  Intake 296.43 ml  Output 1500 ml  Net -1203.57 ml   Filed Weights   10/22/24 0712 10/23/24 0500 10/24/24 9386  Weight: 99.9 kg 101.7 kg 100.6 kg    Examination: Physical exam: General: elderly man lying in the recliner, awake and alert HEENT: [place]/AT, eyes anicteric Neuro: answering questions appropriately.  Chest: breathing comfortably on RA, CTAB heart: mechanical hum of VAD Abdomen: soft, NT  Milrinone  0.182mcg Amio 30 mg  coox 61% Sodium 129 Potassium 3.4 BUN 33 Creatinine 1.44 WBC 11.8 H/H  9/28.5 Platelets 154  Resolved problem list   Leukocytosis  Assessment and Plan   Acute on chronic HFrEF, stage D cardiomyopathy s/p LVAD (HM3 10/14/24)  pHTN, acute on chronic RV failure   PVCs, NSVT  pAFib  -post-op care per TCTS -echo ramp study 1/9-- speed increased to 5500 RPM -coumadin  dosing per pharmacy  -PO amiodarone , mexilitine -digoxin , spironolactone , torsemide  -K+ repletion; requires aggressive repletion -sildenafil  for PH -needs to con't efforts at mobility; will need CIR at this point given his limited endurance -gabapentin  plus tramadol  & oxycodone  as needed gabapentin , tramadol  & oxy PRN -liberalized diet and again encouraged PO intake  Acute hypoxic respiratory failure 2/2 pulmonary edema, deconditioning -  Resolved -mobility, pulmonary hygiene  LUE swelling improving; no DVT on 1/2 -monitor, diuresis for euvolemia  Hypokalemia -replete aggressively  -con't to monitor   CKD 3a -renally dose meds, avoid nephrotoxic meds -strict I/O  Hyponatremia  -con't diuresis -increase PO intake; avoid hypotonic fluids -family  can bring food from home to encourage PO intake  DM2 w hyperglycemia -uncontrolled and labile -con't daily glargine 12 units -mealtime insulin  4 units TIDAC; hold if not eating -SSI PRN -goal BG 140-180  Anemia; expected operative blood loss.  Thrombocytopenia; not HIT -transfuse for Hb  <7 or hemodynamically significant bleeding; last transfusion was 1/1  Deconditioning -stressed the importance of walking and regular mobility-- getting to and from the chair, walks to work towards a safe discharge  Diarrhea -con't fiber -encourage solid food intake in addition to ensure  Insomnia -melatonin, trazodone   Decubitus ulcer sacrum -frequent turns, spending time out of bed & chair is the best way to offload     Labs   CBC: Recent Labs  Lab 10/21/24 0433 10/21/24 0447 10/22/24 0421 10/22/24 0439 10/23/24 0432 10/23/24 0626 10/23/24 0639 10/24/24 0600 10/25/24 0659  WBC 11.1*  --  11.3*  --  10.7*  --   --  9.6 11.8*  NEUTROABS 9.6*  --  10.0*  --  9.4*  --   --  8.2* 10.5*  HGB 8.6*   < > 8.2*   < > 8.6* 8.8* 8.8* 8.6* 9.0*  HCT 25.2*   < > 25.1*   < > 25.9* 26.0* 26.0* 25.9* 28.5*  MCV 91.6  --  95.1  --  93.8  --   --  93.5 96.6  PLT 190  --  174  --  167  --   --  163 154   < > = values in this interval not displayed.    Basic Metabolic Panel: Recent Labs  Lab 10/21/24 0433 10/21/24 0447 10/22/24 0421 10/22/24 0439 10/23/24 0432 10/23/24 0626 10/23/24 0639 10/23/24 1429 10/24/24 0600 10/24/24 1704 10/25/24 0659 10/25/24 1300  NA 131*   < > 128*   < > 129*   < > 128* 129* 129*  --  129* 126*  K 3.4*   < > 2.9*   < > 3.0*   < > 2.7* 3.9 2.9* 4.4 3.4* 4.7  CL 89*   < > 87*   < > 89*  --   --  90* 88*  --  89* 87*  CO2 32   < > 30   < > 28  --   --  28 31  --  30 30  GLUCOSE 167*   < > 216*   < > 97  --   --  185* 114*  --  146* 189*  BUN 39*   < > 38*   < > 38*  --   --  38* 36*  --  33* 29*  CREATININE 1.54*   < > 1.40*   < > 1.33*  --   --  1.44* 1.35*  --  1.44* 1.27*  CALCIUM  8.8*   < > 8.6*   < > 8.4*  --   --  8.5* 8.5*  --  8.4* 8.2*  MG 2.1  --  1.8  --  2.0  --   --   --  1.9  --  1.9  --   PHOS 2.7  --  3.1  --  3.0  --   --   --  2.8  --  2.5 2.5   < > = values in this interval not displayed.   GFR: Estimated Creatinine  Clearance: 77 mL/min (A) (by C-G formula based on SCr of 1.27 mg/dL (H)). Recent Labs  Lab 10/22/24 0421 10/23/24 0432 10/24/24 0600  10/25/24 0659  WBC 11.3* 10.7* 9.6 11.8*    Liver Function Tests: Recent Labs  Lab 10/22/24 0421 10/23/24 0432 10/24/24 0600 10/25/24 0659 10/25/24 1300  ALBUMIN  3.0* 2.9* 2.9* 2.9* 3.0*     ABG    Component Value Date/Time   PHART 7.541 (H) 10/23/2024 0639   PCO2ART 36.6 10/23/2024 0639   PO2ART 65 (L) 10/23/2024 0639   HCO3 31.5 (H) 10/23/2024 0639   TCO2 33 (H) 10/23/2024 0639   ACIDBASEDEF 1.0 10/17/2024 0438   O2SAT 60.7 10/25/2024 0659     Critical care time:      Leita SHAUNNA Gaskins, DO 10/25/2024 6:33 PM [facility]  For contact information, see Amion. If no response to pager, please call PCCM 2H APP. After hours, 7PM- 7AM, please call on call APP for 2H.     "

## 2024-10-25 NOTE — Progress Notes (Signed)
 Milrinone  drip noted to have expired with stop time 1/10 @0912 . During shift change report, pt was reported to be receiving continuous Milrinone  infusion. Infusion was stopped and promptly upon discovery. Provider Tinnie, PA notified face to face and and confirmed discontinuation.

## 2024-10-25 NOTE — Progress Notes (Signed)
 Patient ID: James [name], male   DOB: [birth year], 64 y.o.   MRN: [id number]     Advanced Heart Failure Rounding Note   [facility] Cardiologist: Dr. Zenaida Chief Complaint: Post-op LVAD Patient Profile   James [name] is a 64 y.o. male with history of stage D cardiomyopathy/chronic systolic heart failure now end-stage w/ inotrope dependence, CKD IIIa, hx VT, PAF, prostate cancer, meningioma.   Admitted for LVAD implant/ pre-VAD HF optimization.   Significant events:   12/18: Milrinone  titrated from 0.25 to 0.5 mcg/kg/min post RHC d/t low-output and pulmonary hypertension 12/19: NE added 12/22: Markedly elevated PA pressures, elevated PCWP and elevated SVR. Titrated off NE. Added DBA.  12/23: Impella 5.5 Implanted.  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 Heparin  > Bival.  12/31: HM III LVAD implant 1/5: LVAD speed increased to 5400  1/6: LUE weakness, head CT negative  1/9: Ramp echo, speed increased to 5500 rpm  Subjective:    S/P HM3 LVAD 10/14/24.  Co-ox 61%. Off milrinone . CVP 9-10,  I/Os net positive but weight is down.   More motivated to exercise.  Has walked today.   He remains in AF 90s-100s on amiodarone  gtt 30 mg/hr.  Pending INR.   LVAD INTERROGATION:  HeartMate IIl LVAD:  Flow 5.6 liters/min, speed 5500, power 4, PI 2.3. No PI events.   Objective:    Weight Range: 100.6 kg Body mass index is 25.63 kg/m.   Vital Signs:   Temp:  [97.5 F (36.4 C)-98.3 F (36.8 C)] 98.3 F (36.8 C) (01/11 0618) Pulse Rate:  [91-196] 103 (01/11 0400) Resp:  [10-31] 21 (01/11 0400) BP: (65-107)/(46-94) 77/67 (01/11 0300) SpO2:  [87 %-99 %] 96 % (01/11 0400) Last BM Date : 10/24/24  Doppler MAP: 70s  Weight change: Filed Weights   10/22/24 0712 10/23/24 0500 10/24/24 0613  Weight: 99.9 kg 101.7 kg 100.6 kg   Intake/Output:  Intake/Output Summary (Last 24 hours) at 10/25/2024 0900 Last data filed at 10/25/2024 0700 Gross per 24 hour  Intake 1556.17 ml  Output 1100 ml  Net  456.17 ml    Physical Exam   General: Well appearing this am. NAD.  HEENT: Normal. Neck: Supple, JVP 7-8 cm. Carotids OK.  Cardiac:  Mechanical heart sounds with LVAD hum present.  Lungs:  CTAB, normal effort.  Abdomen:  NT, ND, no HSM. No bruits or masses. +BS  LVAD exit site: Well-healed and incorporated. Dressing dry and intact. No erythema or drainage. Stabilization device present and accurately applied. Driveline dressing changed daily per sterile technique. Extremities:  Warm and dry. No cyanosis, clubbing, rash. 1+ ankle edema.   Neuro:  Alert & oriented x 3. Cranial nerves grossly intact. Moves all 4 extremities w/o difficulty. Affect pleasant     Telemetry   Afib 90s-100s (personally reviewed)  Labs   CBC Recent Labs    10/24/24 0600 10/25/24 0659  WBC 9.6 11.8*  NEUTROABS 8.2* 10.5*  HGB 8.6* 9.0*  HCT 25.9* 28.5*  MCV 93.5 96.6  PLT 163 154   Basic Metabolic Panel Recent Labs    98/89/73 0600 10/24/24 1704 10/25/24 0659  NA 129*  --  129*  K 2.9* 4.4 3.4*  CL 88*  --  89*  CO2 31  --  30  GLUCOSE 114*  --  146*  BUN 36*  --  33*  CREATININE 1.35*  --  1.44*  CALCIUM  8.5*  --  8.4*  MG 1.9  --  1.9  PHOS 2.8  --  2.5   Liver Function Tests Recent Labs    10/24/24 0600 10/25/24 0659  ALBUMIN  2.9* 2.9*   BNP (last 3 results) Recent Labs    06/10/24 1213 07/09/24 1649 07/23/24 0959  BNP 1,071.5* 3,316.0* 595.8*   ProBNP (last 3 results) Recent Labs    10/01/24 1030 10/15/24 0400 10/21/24 0433  PROBNP 8,374.0* 9,508.0* 5,277.0*   Medications:    Scheduled Medications:  Chlorhexidine  Gluconate Cloth  6 each Topical Daily   digoxin   0.0625 mg Oral Daily   gabapentin   200 mg Oral QHS   insulin  aspart  0-15 Units Subcutaneous TID WC   insulin  aspart  0-5 Units Subcutaneous QHS   insulin  aspart  4 Units Subcutaneous TID WC   insulin  glargine  12 Units Subcutaneous Daily   lactose free nutrition  237 mL Oral TID BM   melatonin  3 mg  Oral QHS   mexiletine  200 mg Oral Q12H   pantoprazole   40 mg Oral Daily   polycarbophil  625 mg Oral Daily   potassium chloride   40 mEq Oral Q4H   sildenafil   20 mg Oral TID   sodium chloride  flush  10-40 mL Intracatheter Q12H   spironolactone   50 mg Oral Daily   torsemide   40 mg Oral BID   Warfarin - Pharmacist Dosing Inpatient   Does not apply q1600    Infusions:  amiodarone  30 mg/hr (10/25/24 0700)   magnesium  sulfate bolus IVPB     potassium chloride       PRN Medications: dextrose , guaiFENesin -dextromethorphan , ondansetron  (ZOFRAN ) IV, mouth rinse, oxyCODONE , phenol, polyethylene glycol, senna, traMADol , traZODone   Assessment/Plan   Acute on chronic systolic CHF, NYHA IV: Nonischemic cardiomyopathy, planned LVAD. RHC 12/18 showed elevated filling pressures with mod-severe mixed pulmonary arterial/pulmonary venous hypertension and low CO despite milrinone  0.25, increased to 0.5. Impella 5.5 placed 12/23 for further optimization. - S/p HM III LVAD implant by Dr. Lucas 10/14/24 - iNO off 1/1 - Ramp echo 1/9, increased speed to 5500 rpm.  - Co-ox 61%. Now off milrinone .   - CVP 9-10, increase torsemide  to 40 mg bid. Replace K.  - Continue sildenafil  20 mg TID for RV support - Continue digoxin  0.0625 mg daily - Continue spironolactone  50 mg daily  Pulmonary hypertension: Severe combined pre/post capillary PH on RHC, suspect largely due to venous remodeling. Improved with adequate unloading.  AKI on CKD stage 3: Creatinine stable 1.44.  - follow BMP   PVCs/NSVT: Improved. Has Biotronik ICD. - Continue IV amio 30/hr. - Decrease mexiletine to 150 mg bid.  - Tachy therapies off for VAD implant. Turn back on prior to d/c   Persistent Atrial fibrillation: Remains in AF rate 90s-100s. - Continue amiodarone  gtt 30 mg/hr.  - aggressive K and Mg supp  - Warfarin, pending INR today.  - Plan TEE-guided DCCV on Monday.   Meningioma: Monitoring.   Prostate cancer: Treated with  radiation.   Thrombocytopenia: suspect consumptive with impella.  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 Stable    Hypokalemia/ Hypomagnesemia  - aggressive K and Mg supp   - Continue spironolactone  50 mg daily.   Deconditioning - continue PT/OT - CIR team following   Continue to mobilize, needs to walk and eat. Seems more open to increasing activity.   CRITICAL CARE Performed by: James [name]  Total critical care time: 35 minutes  Critical care  time was exclusive of separately billable procedures and treating other patients.  Critical care was necessary to treat or prevent imminent or life-threatening deterioration.  Critical care was time spent personally by me on the following activities: development of treatment plan with patient and/or surrogate as well as nursing, discussions with consultants, evaluation of patient's response to treatment, examination of patient, obtaining history from patient or surrogate, ordering and performing treatments and interventions, ordering and review of laboratory studies, ordering and review of radiographic studies, pulse oximetry and re-evaluation of patient's condition.  James [name] 10/25/2024 9:00 AM

## 2024-10-25 NOTE — Progress Notes (Signed)
 PHARMACY - ANTICOAGULATION CONSULT NOTE  Pharmacy Consult for warfarin Indication: LVAD HM3 + hx AFib  Allergies[1]  Patient Measurements: Height: 6' 6 (198.1 cm) Weight: 100.6 kg (221 lb 12.5 oz) IBW/kg (Calculated) : 91.4 HEPARIN  DW (KG): 108.7  Vital Signs: Temp: 98.3 F (36.8 C) (01/11 0618) Temp Source: Axillary (01/11 0618) BP: 77/67 (01/11 0300) Pulse Rate: 84 (01/11 1037)  Labs: Recent Labs    10/23/24 0432 10/23/24 0626 10/23/24 0639 10/23/24 1429 10/24/24 0600 10/25/24 0659 10/25/24 0930  HGB 8.6*   < > 8.8*  --  8.6* 9.0*  --   HCT 25.9*   < > 26.0*  --  25.9* 28.5*  --   PLT 167  --   --   --  163 154  --   LABPROT 24.6*  --   --   --  26.4*  --  27.6*  INR 2.1*  --   --   --  2.3*  --  2.4*  CREATININE 1.33*  --   --  1.44* 1.35* 1.44*  --    < > = values in this interval not displayed.    Estimated Creatinine Clearance: 67.9 mL/min (A) (by C-G formula based on SCr of 1.44 mg/dL (H)).   Medical History: Past Medical History:  Diagnosis Date   CKD (chronic kidney disease) stage 2, GFR 60-89 ml/min    COPD (chronic obstructive pulmonary disease) (HCC)    Diabetes mellitus type II, non insulin  dependent (HCC)    Dilated aortic root    Elevated PSA    Hypercholesteremia    Hypertension    NICM (nonischemic cardiomyopathy) (HCC)    NSVT (nonsustained ventricular tachycardia) (HCC)    Obstructive sleep apnea    Paroxysmal atrial fibrillation (HCC)    Pulmonary hypertension (HCC)    PVC's (premature ventricular contractions)    Tobacco abuse     Medications:  Scheduled:   Chlorhexidine  Gluconate Cloth  6 each Topical Daily   digoxin   0.0625 mg Oral Daily   gabapentin   200 mg Oral QHS   insulin  aspart  0-15 Units Subcutaneous TID WC   insulin  aspart  0-5 Units Subcutaneous QHS   insulin  aspart  4 Units Subcutaneous TID WC   insulin  glargine  12 Units Subcutaneous Daily   lactose free nutrition  237 mL Oral TID BM   melatonin  3 mg Oral QHS    mexiletine  150 mg Oral Q12H   pantoprazole   40 mg Oral Daily   polycarbophil  625 mg Oral Daily   potassium chloride   40 mEq Oral Q4H   sildenafil   20 mg Oral TID   sodium chloride  flush  10-40 mL Intracatheter Q12H   spironolactone   50 mg Oral Daily   torsemide   40 mg Oral BID   Warfarin - Pharmacist Dosing Inpatient   Does not apply q1600    Assessment: 63 yom who underwent LVAD HM3 placement on 12/31. Was on apixaban  PTA for hx Afib > now with LVAD plan to change to warfarin.   INR is therapeutic at 2.4.  CBC stable.   Drank 3 nutritional supplements yesterday (enjoying the vanilla flavor more) and reports eating most of his meals (charting not reflective of this).  Remains on amio IV, planning DCCV tomorrow.  Goal of Therapy:  INR 2-2.5 Monitor platelets by anticoagulation protocol: Yes   Plan:  Warfarin 4 mg x1 orally tonight   Monitor daily INR, CBC, and for s/sx of bleeding  F/u PO intake  Thank you for allowing pharmacy to participate in this patient's care,  Maurilio Fila, PharmD Clinical Pharmacist 10/25/2024  12:20 PM      [1] No Known Allergies

## 2024-10-25 NOTE — Progress Notes (Signed)
 Drive Line:  Existing VAD dressing removed and site care performed using sterile technique. Drive line exit site cleaned with Chlora prep applicators x 2, allowed to dry, and gauze dressing with Silverlon patch applied. Exit site healing and unincorporated, the velour is fully implanted at site. Scant amount of sanguinous drainage on previous dressing. No redness, tenderness, foul odor, or rash noted. Drive line anchor reapplied. Continue daily dressing changes by VAD coordinator or nurse champion only. Next dressing change due 10/26/24.

## 2024-10-25 NOTE — Plan of Care (Signed)
" °  Problem: Nutritional: Goal: Maintenance of adequate nutrition will improve Outcome: Progressing   Problem: Skin Integrity: Goal: Risk for impaired skin integrity will decrease Outcome: Not Progressing   Problem: Respiratory: Goal: Ability to maintain a clear airway and adequate ventilation will improve Outcome: Progressing   Problem: Activity: Goal: Risk for activity intolerance will decrease Outcome: Not Progressing   "

## 2024-10-26 ENCOUNTER — Encounter (HOSPITAL_COMMUNITY): Payer: Self-pay | Admitting: Cardiology

## 2024-10-26 ENCOUNTER — Inpatient Hospital Stay (HOSPITAL_COMMUNITY)

## 2024-10-26 ENCOUNTER — Ambulatory Visit (HOSPITAL_COMMUNITY): Admitting: Cardiology

## 2024-10-26 DIAGNOSIS — E876 Hypokalemia: Secondary | ICD-10-CM | POA: Diagnosis not present

## 2024-10-26 DIAGNOSIS — I4892 Unspecified atrial flutter: Secondary | ICD-10-CM

## 2024-10-26 DIAGNOSIS — E1165 Type 2 diabetes mellitus with hyperglycemia: Secondary | ICD-10-CM | POA: Diagnosis not present

## 2024-10-26 DIAGNOSIS — N1831 Chronic kidney disease, stage 3a: Secondary | ICD-10-CM | POA: Diagnosis not present

## 2024-10-26 DIAGNOSIS — I5023 Acute on chronic systolic (congestive) heart failure: Secondary | ICD-10-CM | POA: Diagnosis not present

## 2024-10-26 DIAGNOSIS — I429 Cardiomyopathy, unspecified: Secondary | ICD-10-CM | POA: Diagnosis not present

## 2024-10-26 DIAGNOSIS — L89159 Pressure ulcer of sacral region, unspecified stage: Secondary | ICD-10-CM | POA: Diagnosis not present

## 2024-10-26 DIAGNOSIS — I4811 Longstanding persistent atrial fibrillation: Secondary | ICD-10-CM

## 2024-10-26 DIAGNOSIS — R197 Diarrhea, unspecified: Secondary | ICD-10-CM | POA: Diagnosis not present

## 2024-10-26 DIAGNOSIS — J9601 Acute respiratory failure with hypoxia: Secondary | ICD-10-CM | POA: Diagnosis not present

## 2024-10-26 DIAGNOSIS — I4891 Unspecified atrial fibrillation: Secondary | ICD-10-CM

## 2024-10-26 DIAGNOSIS — E871 Hypo-osmolality and hyponatremia: Secondary | ICD-10-CM | POA: Diagnosis not present

## 2024-10-26 DIAGNOSIS — D696 Thrombocytopenia, unspecified: Secondary | ICD-10-CM | POA: Diagnosis not present

## 2024-10-26 DIAGNOSIS — G4700 Insomnia, unspecified: Secondary | ICD-10-CM | POA: Diagnosis not present

## 2024-10-26 DIAGNOSIS — Z95811 Presence of heart assist device: Secondary | ICD-10-CM

## 2024-10-26 DIAGNOSIS — I480 Paroxysmal atrial fibrillation: Secondary | ICD-10-CM | POA: Diagnosis not present

## 2024-10-26 LAB — CBC WITH DIFFERENTIAL/PLATELET
Abs Immature Granulocytes: 0.09 K/uL — ABNORMAL HIGH (ref 0.00–0.07)
Basophils Absolute: 0 K/uL (ref 0.0–0.1)
Basophils Relative: 0 %
Eosinophils Absolute: 0.1 K/uL (ref 0.0–0.5)
Eosinophils Relative: 1 %
HCT: 27.7 % — ABNORMAL LOW (ref 39.0–52.0)
Hemoglobin: 9.2 g/dL — ABNORMAL LOW (ref 13.0–17.0)
Immature Granulocytes: 1 %
Lymphocytes Relative: 5 %
Lymphs Abs: 0.5 K/uL — ABNORMAL LOW (ref 0.7–4.0)
MCH: 31.6 pg (ref 26.0–34.0)
MCHC: 33.2 g/dL (ref 30.0–36.0)
MCV: 95.2 fL (ref 80.0–100.0)
Monocytes Absolute: 0.8 K/uL (ref 0.1–1.0)
Monocytes Relative: 7 %
Neutro Abs: 9.6 K/uL — ABNORMAL HIGH (ref 1.7–7.7)
Neutrophils Relative %: 86 %
Platelets: 160 K/uL (ref 150–400)
RBC: 2.91 MIL/uL — ABNORMAL LOW (ref 4.22–5.81)
RDW: 19.1 % — ABNORMAL HIGH (ref 11.5–15.5)
WBC: 11.1 K/uL — ABNORMAL HIGH (ref 4.0–10.5)
nRBC: 0 % (ref 0.0–0.2)

## 2024-10-26 LAB — BASIC METABOLIC PANEL WITH GFR
Anion gap: 9 (ref 5–15)
BUN: 32 mg/dL — ABNORMAL HIGH (ref 8–23)
CO2: 31 mmol/L (ref 22–32)
Calcium: 8 mg/dL — ABNORMAL LOW (ref 8.9–10.3)
Chloride: 94 mmol/L — ABNORMAL LOW (ref 98–111)
Creatinine, Ser: 1.35 mg/dL — ABNORMAL HIGH (ref 0.61–1.24)
GFR, Estimated: 59 mL/min — ABNORMAL LOW
Glucose, Bld: 153 mg/dL — ABNORMAL HIGH (ref 70–99)
Potassium: 3.9 mmol/L (ref 3.5–5.1)
Sodium: 133 mmol/L — ABNORMAL LOW (ref 135–145)

## 2024-10-26 LAB — POCT I-STAT 7, (LYTES, BLD GAS, ICA,H+H)
Acid-Base Excess: 11 mmol/L — ABNORMAL HIGH (ref 0.0–2.0)
Bicarbonate: 33.3 mmol/L — ABNORMAL HIGH (ref 20.0–28.0)
Calcium, Ion: 1.04 mmol/L — ABNORMAL LOW (ref 1.15–1.40)
HCT: 26 % — ABNORMAL LOW (ref 39.0–52.0)
Hemoglobin: 8.8 g/dL — ABNORMAL LOW (ref 13.0–17.0)
O2 Saturation: 98 %
Patient temperature: 97.8
Potassium: 3.2 mmol/L — ABNORMAL LOW (ref 3.5–5.1)
Sodium: 128 mmol/L — ABNORMAL LOW (ref 135–145)
TCO2: 34 mmol/L — ABNORMAL HIGH (ref 22–32)
pCO2 arterial: 32.3 mmHg (ref 32–48)
pH, Arterial: 7.62 (ref 7.35–7.45)
pO2, Arterial: 88 mmHg (ref 83–108)

## 2024-10-26 LAB — RENAL FUNCTION PANEL
Albumin: 2.9 g/dL — ABNORMAL LOW (ref 3.5–5.0)
Anion gap: 10 (ref 5–15)
BUN: 32 mg/dL — ABNORMAL HIGH (ref 8–23)
CO2: 32 mmol/L (ref 22–32)
Calcium: 8 mg/dL — ABNORMAL LOW (ref 8.9–10.3)
Chloride: 90 mmol/L — ABNORMAL LOW (ref 98–111)
Creatinine, Ser: 1.33 mg/dL — ABNORMAL HIGH (ref 0.61–1.24)
GFR, Estimated: >60 mL/min
Glucose, Bld: 160 mg/dL — ABNORMAL HIGH (ref 70–99)
Phosphorus: 3.2 mg/dL (ref 2.5–4.6)
Potassium: 3.1 mmol/L — ABNORMAL LOW (ref 3.5–5.1)
Sodium: 133 mmol/L — ABNORMAL LOW (ref 135–145)

## 2024-10-26 LAB — PROTIME-INR
INR: 2.2 — ABNORMAL HIGH (ref 0.8–1.2)
Prothrombin Time: 25.6 s — ABNORMAL HIGH (ref 11.4–15.2)

## 2024-10-26 LAB — ECHO TEE: Est EF: <20

## 2024-10-26 LAB — GLUCOSE, CAPILLARY
Glucose-Capillary: 130 mg/dL — ABNORMAL HIGH (ref 70–99)
Glucose-Capillary: 112 mg/dL — ABNORMAL HIGH (ref 70–99)
Glucose-Capillary: 106 mg/dL — ABNORMAL HIGH (ref 70–99)
Glucose-Capillary: 91 mg/dL (ref 70–99)

## 2024-10-26 LAB — LACTATE DEHYDROGENASE: LDH: 243 U/L — ABNORMAL HIGH (ref 105–235)

## 2024-10-26 LAB — MAGNESIUM: Magnesium: 2 mg/dL (ref 1.7–2.4)

## 2024-10-26 LAB — COOXEMETRY PANEL
Carboxyhemoglobin: 2.6 % — ABNORMAL HIGH (ref 0.5–1.5)
Methemoglobin: <0.7 % (ref 0.0–1.5)
O2 Saturation: 59.8 %
Total hemoglobin: 9.2 g/dL — ABNORMAL LOW (ref 12.0–16.0)

## 2024-10-26 MED ORDER — POTASSIUM CHLORIDE 20 MEQ PO PACK: 20.0000 meq | PACK | ORAL | Status: DC

## 2024-10-26 MED ORDER — FENTANYL CITRATE (PF) 50 MCG/ML IJ SOSY: 50.0000 ug | PREFILLED_SYRINGE | Freq: Once | INTRAMUSCULAR | Status: AC

## 2024-10-26 MED ORDER — POTASSIUM CHLORIDE 20 MEQ PO PACK
40.0000 meq | PACK | Freq: Every day | ORAL | Status: DC
  Administered 2024-10-26: 40 meq via ORAL
  Filled 2024-10-26: qty 2

## 2024-10-26 MED ORDER — COLLAGENASE 250 UNIT/GM EX OINT
TOPICAL_OINTMENT | Freq: Every day | CUTANEOUS | Status: DC
  Filled 2024-10-26 (×2): qty 30

## 2024-10-26 MED ORDER — TORSEMIDE 20 MG PO TABS: 40.0000 mg | ORAL_TABLET | Freq: Two times a day (BID) | ORAL | Status: DC

## 2024-10-26 MED ORDER — SODIUM CHLORIDE 0.9 % IV SOLN: INTRAVENOUS | Status: AC

## 2024-10-26 MED ORDER — POTASSIUM CHLORIDE 20 MEQ PO PACK: 40.0000 meq | PACK | Freq: Two times a day (BID) | ORAL | Status: DC

## 2024-10-26 MED ORDER — FENTANYL CITRATE (PF) 50 MCG/ML IJ SOSY
PREFILLED_SYRINGE | INTRAMUSCULAR | Status: AC
  Administered 2024-10-26: 50 ug via INTRAVENOUS
  Filled 2024-10-26: qty 1

## 2024-10-26 MED ORDER — KETAMINE HCL 50 MG/5ML IJ SOSY
100.0000 mg | PREFILLED_SYRINGE | Freq: Once | INTRAMUSCULAR | Status: AC
  Administered 2024-10-26: 100 mg via INTRAVENOUS
  Filled 2024-10-26: qty 10

## 2024-10-26 MED ORDER — MIDAZOLAM HCL (PF) 2 MG/2ML IJ SOLN
2.0000 mg | Freq: Once | INTRAMUSCULAR | Status: AC
  Administered 2024-10-26: 2 mg via INTRAVENOUS
  Filled 2024-10-26: qty 2

## 2024-10-26 MED ORDER — WARFARIN SODIUM 2 MG PO TABS
2.0000 mg | ORAL_TABLET | Freq: Once | ORAL | Status: AC
  Administered 2024-10-26: 2 mg via ORAL
  Filled 2024-10-26: qty 1

## 2024-10-26 MED ORDER — POTASSIUM CHLORIDE 20 MEQ PO PACK
40.0000 meq | PACK | Freq: Once | ORAL | Status: AC
  Administered 2024-10-26: 40 meq via ORAL
  Filled 2024-10-26: qty 2

## 2024-10-26 MED ORDER — PHENYLEPHRINE 80 MCG/ML (10ML) SYRINGE FOR IV PUSH (FOR BLOOD PRESSURE SUPPORT)
PREFILLED_SYRINGE | INTRAVENOUS | Status: AC
  Administered 2024-10-26: 160 ug via INTRAVENOUS
  Filled 2024-10-26: qty 10

## 2024-10-26 MED ORDER — SPIRONOLACTONE 25 MG PO TABS
50.0000 mg | ORAL_TABLET | Freq: Two times a day (BID) | ORAL | Status: DC
  Administered 2024-10-26 – 2024-11-02 (×15): 50 mg via ORAL
  Filled 2024-10-26 (×15): qty 2

## 2024-10-26 MED ORDER — POTASSIUM CHLORIDE 20 MEQ PO PACK
20.0000 meq | PACK | ORAL | Status: DC
  Administered 2024-10-26: 20 meq
  Filled 2024-10-26: qty 1

## 2024-10-26 MED ORDER — TORSEMIDE 20 MG PO TABS: 40.0000 mg | ORAL_TABLET | Freq: Every day | ORAL | Status: DC

## 2024-10-26 MED ORDER — POTASSIUM CHLORIDE 10 MEQ/50ML IV SOLN
10.0000 meq | INTRAVENOUS | Status: AC
  Administered 2024-10-26 (×5): 10 meq via INTRAVENOUS
  Filled 2024-10-26 (×2): qty 50

## 2024-10-26 MED ORDER — PHENYLEPHRINE 80 MCG/ML (10ML) SYRINGE FOR IV PUSH (FOR BLOOD PRESSURE SUPPORT): 160.0000 ug | PREFILLED_SYRINGE | Freq: Once | INTRAVENOUS | Status: AC | PRN

## 2024-10-26 NOTE — Progress Notes (Signed)
 "  NAME:  James [name], MRN:  [id number], DOB:  07-10-1961, LOS: 25 ADMISSION DATE:  10/01/2024, CONSULTATION DATE:  10/14/24 REFERRING MD:  Lucas , CHIEF COMPLAINT:  s/p LVAD   History of Present Illness:  64 yo M with stg D cardiomyopathy + inotrope dependence, CKD 3a, hx VT, pAFib, meningioma who was admitted to HF service 10/01/24 for pre-VAD optimization  This included: RHC, milrinone  titration, impella placement, levo and DBA titration and volume optimization  On 10/14/24 he went for LVAD  Pump: 116 min  EBL: 750 Product: 2 plt 4 ffp 708 cellsaver  PCCM is consulted post op ICU management.  Pertinent  Medical History  HFrEF Inotrope dependence VT pAFib  Prostate cancer Meningioma  HTN DM2   Significant Hospital Events: Including procedures, antibiotic start and stop dates in addition to other pertinent events    10/01/24 RHC w low output and pHTN, and subsequent uptitration of milrinone  from 0.25 to 0.5 10/02/24 started ND 10/05/24 high PA pressures, hignPCWP high SVR. 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 Hep to bival 10/13/24 not likely HIT. Milrinone  to 0.375  10/14/24 LVAD. PCCM consult.  10/15/24 extubated, nitric off, 1u prbc, lasix  10/20/24: [name] is out 1/8- Off epi 1/9 milrinone  decreased 1/10 milrinone  off  Interim History / Subjective:  Walked a few times yesterday. Ate well when family brought him a burger. Stools getting more formed. Has more energy.  Objective    Blood pressure 92/74, pulse 85, temperature 97.7 F (36.5 C), temperature source Oral, resp. rate 14, height 6' 6 (1.981 m), weight 97.1 kg, SpO2 95%. CVP:  [5 mmHg-19 mmHg] 5 mmHg      Intake/Output Summary (Last 24 hours) at 10/26/2024 9178 Last data filed at 10/26/2024 0800 Gross per 24 hour  Intake 688.51 ml  Output 3150 ml  Net -2461.49 ml   Filed  Weights   10/23/24 0500 10/24/24 9386 10/26/24 0500  Weight: 101.7 kg 100.6 kg 97.1 kg    Examination: Physical exam: General: elderly man lying in bed in NAD HEENT: [place]/AT, eyes anicteric Neuro: awake, alert, answering questions appropriately, moving all extremities Chest: breathing comfortably on RA, CTAB, no conversational dyspnea heart: mechanical hum of LVAD Abdomen: soft, NT  Wt 214#, about where he was pre-op  Amio 30mg   coox 60% Sodium 133 Potassium 3.1 BUN 32 Creatinine 1.33 WBC 11.1 H/H  9.2/27.7 Platelets 160 INR 2.2  Resolved problem list   Leukocytosis  Assessment and Plan   Acute on chronic HFrEF, stage D cardiomyopathy s/p LVAD (HM3 10/14/24)  pHTN, acute on chronic RV failure   PVCs, NSVT  pAFib  -Post-op care per TCTS -echo ramp study 1/9-- speed increased to 5500 RPM -coumadin  dosing per pharmacy, INR in range  -con't IV amiodarone  until cardioversion, con't mexilitine -con't digoxin , torsemide . Diuretics per AHF. -spironolactone  50mg  BID to help with K+ -needs aggressive K+ repletion still; getting this morning, anticipate he will need more this afternoon to prevent dropping -con't sildenafil  for PH -con't mobilizing -gabapentin  daily, tramadol  and oxy PRN for pain -Liberalized diet, con't to encouraged PO intake. Family can bring food in. -planning for TEE w/ cardioversion today  Acute hypoxic respiratory failure 2/2 pulmonary edema, deconditioning -  Resolved -mobility, pulmonary hygiene  LUE swelling improving; no DVT on 1/2 -monitor, diuresis  Hyponatremia- improving with better PO intake -liberalized diet until PO intake is better  Hypokalemia -replete aggressively - getting this morning, recheck this afternoon -monitor  CKD 3a -renally dose meds, avoid nephrotoxic meds  Hyponatremia  -diuresis -avoid hypotonic fluids and increase PO intake -family can bring food from home to encourage PO intake; when regularly  eating a normal amount again he will need to watch Na+   DM2 w hyperglycemia -controlled -glargine 12 units daily -mealtime insulin  4 units TIDAC -SSI PRN -goal BG 140-180  Anemia; expected operative blood loss> improving. Has not needed transfusions since POD 1 Thrombocytopenia; not HIT -monitor periodically, transfuse for Hb <7  Deconditioning -encouraged mobility  Diarrhea -fiber until loose stools resolved -encouraged solid food intake  Insomnia -con't trazodone  and melatonin  Decubitus ulcer sacrum -OOB mobility, encouraged lying on his side, frequent turns, optimizing nutrition   Hopefully ready for CIR soon.   Labs   CBC: Recent Labs  Lab 10/22/24 0421 10/22/24 0439 10/23/24 0432 10/23/24 0626 10/23/24 0639 10/24/24 0600 10/25/24 0659 10/26/24 0429  WBC 11.3*  --  10.7*  --   --  9.6 11.8* 11.1*  NEUTROABS 10.0*  --  9.4*  --   --  8.2* 10.5* 9.6*  HGB 8.2*   < > 8.6* 8.8* 8.8* 8.6* 9.0* 9.2*  HCT 25.1*   < > 25.9* 26.0* 26.0* 25.9* 28.5* 27.7*  MCV 95.1  --  93.8  --   --  93.5 96.6 95.2  PLT 174  --  167  --   --  163 154 160   < > = values in this interval not displayed.    Basic Metabolic Panel: Recent Labs  Lab 10/22/24 0421 10/22/24 0439 10/23/24 0432 10/23/24 0626 10/23/24 1429 10/24/24 0600 10/24/24 1704 10/25/24 0659 10/25/24 1300 10/26/24 0429  NA 128*   < > 129*   < > 129* 129*  --  129* 126* 133*  K 2.9*   < > 3.0*   < > 3.9 2.9* 4.4 3.4* 4.7 3.1*  CL 87*   < > 89*  --  90* 88*  --  89* 87* 90*  CO2 30   < > 28  --  28 31  --  30 30 32  GLUCOSE 216*   < > 97  --  185* 114*  --  146* 189* 160*  BUN 38*   < > 38*  --  38* 36*  --  33* 29* 32*  CREATININE 1.40*   < > 1.33*  --  1.44* 1.35*  --  1.44* 1.27* 1.33*  CALCIUM  8.6*   < > 8.4*  --  8.5* 8.5*  --  8.4* 8.2* 8.0*  MG 1.8  --  2.0  --   --  1.9  --  1.9  --  2.0  PHOS 3.1  --  3.0  --   --  2.8  --  2.5 2.5 3.2   < > = values in this interval not displayed.    GFR: Estimated Creatinine Clearance: 73.5 mL/min (A) (by C-G formula based on SCr of 1.33 mg/dL (H)). Recent Labs  Lab 10/23/24 0432 10/24/24 0600 10/25/24 0659 10/26/24 0429  WBC 10.7* 9.6 11.8* 11.1*    Liver Function Tests: Recent Labs  Lab 10/23/24 0432 10/24/24 0600 10/25/24 0659 10/25/24 1300 10/26/24 0429  ALBUMIN  2.9* 2.9* 2.9* 3.0* 2.9*     ABG    Component Value Date/Time   PHART 7.541 (H) 10/23/2024 0639   PCO2ART 36.6 10/23/2024 0639   PO2ART 65 (L) 10/23/2024 9360  HCO3 31.5 (H) 10/23/2024 0639   TCO2 33 (H) 10/23/2024 0639   ACIDBASEDEF 1.0 10/17/2024 0438   O2SAT 59.8 10/26/2024 0429     Critical care time:      Leita SHAUNNA Gaskins, DO 10/26/2024 8:34 AM [facility]  For contact information, see Amion. If no response to pager, please call PCCM 2H APP. After hours, 7PM- 7AM, please call on call APP for 2H.     "

## 2024-10-26 NOTE — Progress Notes (Signed)
 Physical Therapy Treatment Patient Details Name: James [name] MRN: [id number] DOB: 1961/05/24 Today's Date: 10/26/2024   History of Present Illness Pt is a 64 yo male who was admitted 10/01/24 for pre-VAD optimization prior to planned surgery. S/p R heart cath 12/18 & 12/24, impella placement 12/23, impella removal and HM III LVAD placement 12/31. Extubated 10/15/24. Developed L sided weakness, CT negative for acute infarct. PMH: CKD IIIa, hx VT, PAF, prostate cancer, meningioma, COPD, DM2, HTN, hypercholesteremia, OSA, pulmonary hypertension, tobacco abuse.    PT Comments  Making steady progress. Transitioned to RW for gait training today, pt a bit anxious but tolerated well. Min assist with initial steps and backing up at end of session but majority of distance at [facility] level with this device. Pt reports increased fatigue compared to [facility] walker use. Required up to Mod assist to stand from elevated bed surface. Remains motivated, working OOB with nursing staff daily as well. Patient will continue to benefit from skilled physical therapy services to further improve independence with functional mobility.     If plan is discharge home, recommend the following: Assistance with cooking/housework;Direct supervision/assist for medications management;Direct supervision/assist for financial management;Assist for transportation;Help with stairs or ramp for entrance;Supervision due to cognitive status;A lot of help with walking and/or transfers;A lot of help with bathing/dressing/bathroom   Can travel by private vehicle        Equipment Recommendations  Rolling walker (2 wheels);BSC/3in1 (pending progress; likely to  progress to rollator)    Recommendations for Other Services Rehab consult     Precautions / Restrictions Precautions Precautions: Fall;Sternal;Other (comment) Recall of Precautions/Restrictions: Impaired Precaution/Restrictions Comments: LVAD;  A-line Restrictions Weight Bearing  Restrictions Per Provider Order: Yes RUE Weight Bearing Per Provider Order: Non weight bearing LUE Weight Bearing Per Provider Order: Non weight bearing Other Position/Activity Restrictions: sternal precautions     Mobility  Bed Mobility Overal bed mobility: Needs Assistance Bed Mobility: Rolling, Sit to Sidelying, Sidelying to Sit Rolling: Contact guard assist Sidelying to sit: Min assist     Sit to sidelying: Mod assist, +2 for physical assistance, HOB elevated General bed mobility comments: CGA to roll, min assist to rise with cues for technique. Good recall of precautions. Mod assist +2 for trunk and LE support back into bed with management of leads/llines.    Transfers Overall transfer level: Needs assistance Equipment used: Rolling walker (2 wheels) Transfers: Sit to/from Stand Sit to Stand: Mod assist, From elevated surface           General transfer comment: Mod assist for boost to stand from elevated bed surface, using heart pillow for precautions/comfort. Cues for technique, but poor execution of forward lean.    Ambulation/Gait Ambulation/Gait assistance: Min assist Gait Distance (Feet): 495 Feet Assistive device: Rolling walker (2 wheels) Gait Pattern/deviations: Decreased stride length, Trunk flexed, Step-through pattern Gait velocity: reduced Gait velocity interpretation: <1.8 ft/sec, indicate of risk for recurrent falls Pre-gait activities: Static march with BIL UEs on RW. General Gait Details: Initially min assist for RW control, cues for technique with this device as we have weaned from [place] walker today. Lets RW drift a bit too far forward at times but corrects with VC. Min assist for RW control with stepping backwards. No buckling or LOB, and majority of balance at CGA level.   Stairs             Wheelchair Mobility     Tilt Bed    Modified Rankin (Stroke Patients Only)  Balance Overall balance assessment: Needs  assistance Sitting-balance support: No upper extremity supported, Feet supported Sitting balance-Leahy Scale: Fair     Standing balance support: Bilateral upper extremity supported Standing balance-Leahy Scale: Poor Standing balance comment: RW                            Communication Communication Communication: No apparent difficulties  Cognition Arousal: Alert Behavior During Therapy: WFL for tasks assessed/performed   PT - Cognitive impairments: Problem solving, Safety/Judgement, Awareness, Memory                       PT - Cognition Comments: Recalled 1/3 items in LVAD kit. Following commands: Impaired Following commands impaired: Follows multi-step commands with increased time    Cueing Cueing Techniques: Verbal cues, Tactile cues, Gestural cues  Exercises      General Comments General comments (skin integrity, edema, etc.): Connected LVAD for pt due to prolonged set-up prior to mobiizing. Talked pt through it, a bit confused. Helped donne vest and insert batteries.      Pertinent Vitals/Pain Pain Assessment Pain Assessment: Faces Faces Pain Scale: Hurts a little bit Pain Location: chest/sacral wound Pain Descriptors / Indicators: Discomfort, Sore Pain Intervention(s): Limited activity within patient's tolerance, Monitored during session, Repositioned    Home Living                          Prior Function            PT Goals (current goals can now be found in the care plan section) Acute Rehab PT Goals Patient Stated Goal: to improve Progress towards PT goals: Progressing toward goals    Frequency    Min 3X/week      PT Plan      Co-evaluation              AM-PAC PT 6 Clicks Mobility   Outcome Measure  Help needed turning from your back to your side while in a flat bed without using bedrails?: A Little Help needed moving from lying on your back to sitting on the side of a flat bed without using bedrails?: A  Little Help needed moving to and from a bed to a chair (including a wheelchair)?: A Lot Help needed standing up from a chair using your arms (e.g., wheelchair or bedside chair)?: A Lot Help needed to walk in hospital room?: A Little Help needed climbing 3-5 steps with a railing? : Total 6 Click Score: 14    End of Session Equipment Utilized During Treatment: Gait belt Activity Tolerance: Patient tolerated treatment well Patient left: in bed;with call bell/phone within reach;with nursing/sitter in room;with bed alarm set Nurse Communication: Mobility status PT Visit Diagnosis: Unsteadiness on feet (R26.81);Other abnormalities of gait and mobility (R26.89);Muscle weakness (generalized) (M62.81);Difficulty in walking, not elsewhere classified (R26.2);Pain     Time: 1217-1259 PT Time Calculation (min) (ACUTE ONLY): 42 min  Charges:    $Gait Training: 8-22 mins $Therapeutic Activity: 23-37 mins PT General Charges $$ ACUTE PT VISIT: 1 Visit                     Leontine Roads, PT, DPT [facility]  Rehabilitation Services Physical Therapist Office: [phone] Website: [email or web address]    Leontine GORMAN Roads 10/26/2024, 2:11 PM

## 2024-10-26 NOTE — CV Procedure (Signed)
" ° °  TRANSESOPHAGEAL ECHOCARDIOGRAM GUIDED DIRECT CURRENT CARDIOVERSION  NAME:  James [name]   MRN: [id number] DOB:  [birth year]   ADMIT DATE: 10/01/2024  INDICATIONS:  Atrial flutter   PROCEDURE:   Informed consent was obtained prior to the procedure. The risks, benefits and alternatives for the procedure were discussed and the patient comprehended these risks.  Risks include, but are not limited to, cough, sore throat, vomiting, nausea, somnolence, esophageal and stomach trauma or perforation, bleeding, low blood pressure, aspiration, pneumonia, infection, trauma to the teeth and death.    After a procedural time-out, the oropharynx was anesthetized and the patient was sedated by the CCMservice. The transesophageal probe was inserted in the esophagus and stomach without difficulty and multiple views were obtained.   FINDINGS:  LEFT VENTRICLE: EF = 20%  Severe LVH. LVAD cannula present  RIGHT VENTRICLE: Moderate to severe HK  LEFT ATRIUM: Normal. No clots  LEFT ATRIAL APPENDAGE: Small. No clots  RIGHT ATRIUM: Markedly dilated  AORTIC VALVE:  Trileaflet. Minimal opening with significant smoke in aortic root. Trivial AI  MITRAL VALVE:     No MR  TRICUSPID VALVE: Mild TR  PULMONIC VALVE: Triv PR  DESCENDING AORTA: Normal   CARDIOVERSION:     Indications:  Atrial Flutter  Procedure Details:  Once the TEE was complete, the patient had the defibrillator pads placed in the anterior and posterior position. Once an appropriate level of sedation was achieved, the patient received a single biphasic, synchronized 150J shock with prompt conversion to sinus rhythm. No apparent complications.   Toribio Fuel, MD  3:13 PM     "

## 2024-10-26 NOTE — Progress Notes (Signed)
 H&V Care Navigation CSW Progress Note  Outpatient HF CSW met with pt at bedside to check in and discuss disability.  Patient reports he is doing well overall and has remained in good spirits throughout hospital stay.  States he has had very little discomfort other than his bottom and is proud of his improvements with his mobility (walked a long lap around the unit this morning).  Does have several instances of becoming tearful during conversation when reflecting on his health and remembering the loss of his father when he was 73yo which he felt was too soon.  CSW provided active listening and support.  Patient copes with these thoughts through his faith and acknowledges that some things are outside our control.  CSW and patient also discussed applying for long term disability through [place].  Currently getting disability benefits through his work but unsure how long they last- encouraged him to discover potential length of benefits as SSDI would take 6-9 months to get a determination so would need to know if he would have income during that time while applying.  Patient will follow up with disability company to inquire.  CSW discuss SSDI application process and provided with information sheet on how to initiate.  He will attempt to apply online with help from family and will reach out to CSW if this is not possible or too difficult to complete.  Reports no further needs at this time- CSW will continue to follow during implant stay and assist as needed  Mellony Danziger H. Mikayah Joy, LCSW Clinical Social Worker Advanced Heart Failure Clinic Desk#: [phone] Cell#: [phone]

## 2024-10-26 NOTE — Progress Notes (Signed)
 Pre-sedation assessment  Mallampati 3 ASA 4    Leita SHAUNNA Gaskins, DO 10/26/2024 1:59 PM [facility]  For contact information, see Amion. If no response to pager, please call PCCM consult pager. After hours, 7PM- 7AM, please call Elink.

## 2024-10-26 NOTE — Plan of Care (Signed)
" °  Problem: Education: Goal: Understanding of CV disease, CV risk reduction, and recovery process will improve Outcome: Progressing   Problem: Activity: Goal: Ability to return to baseline activity level will improve Outcome: Progressing   Problem: Cardiovascular: Goal: Ability to achieve and maintain adequate cardiovascular perfusion will improve Outcome: Progressing   Problem: Health Behavior/Discharge Planning: Goal: Ability to safely manage health-related needs after discharge will improve Outcome: Progressing   Problem: Education: Goal: Knowledge of General Education information will improve Description: Including pain rating scale, medication(s)/side effects and non-pharmacologic comfort measures Outcome: Progressing   Problem: Health Behavior/Discharge Planning: Goal: Ability to manage health-related needs will improve Outcome: Progressing   Problem: Clinical Measurements: Goal: Ability to maintain clinical measurements within normal limits will improve Outcome: Progressing Goal: Will remain free from infection Outcome: Progressing Goal: Diagnostic test results will improve Outcome: Progressing Goal: Respiratory complications will improve Outcome: Progressing   Problem: Activity: Goal: Risk for activity intolerance will decrease Outcome: Progressing   Problem: Coping: Goal: Level of anxiety will decrease Outcome: Progressing   Problem: Elimination: Goal: Will not experience complications related to urinary retention Outcome: Progressing   Problem: Pain Managment: Goal: General experience of comfort will improve and/or be controlled Outcome: Progressing   Problem: Safety: Goal: Ability to remain free from injury will improve Outcome: Progressing   "

## 2024-10-26 NOTE — Progress Notes (Signed)
 Inpatient Rehab Admissions Coordinator:    CIR following. Not yet medically ready. Note decline in function in most recent PT session.   Leita Kleine, MS, CCC-SLP Rehab Admissions Coordinator  [phone] (celll) [phone] (office)

## 2024-10-26 NOTE — Procedures (Signed)
 Conscious sedation note:  During this procedure the patient is administered a total of versed  2mg , ketamine  100mg , fentanyl  50mcg to achieve and maintain moderate conscious sedation.  The patient's heart rate, blood pressure, ETCO2, and oxygen  saturation were monitored continuously during the procedure.   Versed  2mg  14:39 Ketamine  50mg  14:41 Ketamine  50mg  14:42 Fentanyl  14:45  TEE performed per cardiology note. Shocked at 14:40  Monitored until 15:01  The period of conscious sedation is 22 minutes, of which I was present face-to-face 100% of this time.  Leita SHAUNNA Gaskins, DO 10/26/2024 3:29 PM [facility]  For contact information, see Amion. If no response to pager, please call PCCM 2H APP. After hours, 7PM- 7AM, please call on call APP for 2H.

## 2024-10-26 NOTE — Progress Notes (Signed)
 PHARMACY - ANTICOAGULATION CONSULT NOTE  Pharmacy Consult for warfarin Indication: LVAD HM3 + hx AFib  Allergies[1]  Patient Measurements: Height: 6' 6 (198.1 cm) Weight: 97.1 kg (214 lb 1.1 oz) IBW/kg (Calculated) : 91.4 HEPARIN  DW (KG): 108.7  Vital Signs: Temp: 97.7 F (36.5 C) (01/12 0622) Temp Source: Oral (01/12 0622) BP: 79/57 (01/12 0600) Pulse Rate: 153 (01/12 0600)  Labs: Recent Labs    10/24/24 0600 10/25/24 0659 10/25/24 0930 10/25/24 1300 10/26/24 0429  HGB 8.6* 9.0*  --   --  9.2*  HCT 25.9* 28.5*  --   --  27.7*  PLT 163 154  --   --  160  LABPROT 26.4*  --  27.6*  --  25.6*  INR 2.3*  --  2.4*  --  2.2*  CREATININE 1.35* 1.44*  --  1.27* 1.33*    Estimated Creatinine Clearance: 73.5 mL/min (A) (by C-G formula based on SCr of 1.33 mg/dL (H)).   Medical History: Past Medical History:  Diagnosis Date   CKD (chronic kidney disease) stage 2, GFR 60-89 ml/min    COPD (chronic obstructive pulmonary disease) (HCC)    Diabetes mellitus type II, non insulin  dependent (HCC)    Dilated aortic root    Elevated PSA    Hypercholesteremia    Hypertension    NICM (nonischemic cardiomyopathy) (HCC)    NSVT (nonsustained ventricular tachycardia) (HCC)    Obstructive sleep apnea    Paroxysmal atrial fibrillation (HCC)    Pulmonary hypertension (HCC)    PVC's (premature ventricular contractions)    Tobacco abuse     Medications:  Scheduled:   Chlorhexidine  Gluconate Cloth  6 each Topical Daily   digoxin   0.0625 mg Oral Daily   gabapentin   200 mg Oral QHS   insulin  aspart  0-15 Units Subcutaneous TID WC   insulin  aspart  0-5 Units Subcutaneous QHS   insulin  aspart  4 Units Subcutaneous TID WC   insulin  glargine  12 Units Subcutaneous Daily   lactose free nutrition  237 mL Oral TID BM   melatonin  3 mg Oral QHS   mexiletine  150 mg Oral Q12H   pantoprazole   40 mg Oral Daily   polycarbophil  625 mg Oral Daily   sildenafil   20 mg Oral TID   sodium  chloride flush  10-40 mL Intracatheter Q12H   spironolactone   50 mg Oral Daily   torsemide   40 mg Oral BID   Warfarin - Pharmacist Dosing Inpatient   Does not apply q1600    Assessment: 63 yom who underwent LVAD HM3 placement on 12/31. Was on apixaban  PTA for hx Afib > now with LVAD plan to change to warfarin.   INR is therapeutic at 2.2. Hgb 9.2, plt 160, LDH 243. Drank 3 nutritional supplements yesterday (enjoying the vanilla flavor more) and reports eating most of his meals - of note, he has been NPO most of today for TEE/DCCV. Remains on amio IV.  Goal of Therapy:  INR 2-2.5 Monitor platelets by anticoagulation protocol: Yes   Plan:  Warfarin 2 mg x1 orally tonight   Monitor daily INR, CBC, and for s/sx of bleeding  F/u PO intake  Thank you for allowing pharmacy to participate in this patient's care,  Suzen Sour, PharmD, BCCCP Clinical Pharmacist  Phone: [phone] 10/26/2024 7:31 AM  Please check AMION for all [facility] Pharmacy phone numbers After 10:00 PM, call Main Pharmacy [phone]      [1] No Known Allergies

## 2024-10-26 NOTE — Progress Notes (Addendum)
 Patient ID: Tejuan Gholson, male   DOB: 07/03/1961, 64 y.o.   MRN: [id number]     Advanced Heart Failure Rounding Note   AHF Cardiologist: Dr. Zenaida Chief Complaint: Post-op LVAD Patient Profile   Tyrece Vanterpool is a 64 y.o. male with history of stage D cardiomyopathy/chronic systolic heart failure now end-stage w/ inotrope dependence, CKD IIIa, hx VT, PAF, prostate cancer, meningioma.   Admitted for LVAD implant/ pre-VAD HF optimization.   Significant events:   12/18: Milrinone  titrated from 0.25 to 0.5 mcg/kg/min post RHC d/t low-output and pulmonary hypertension 12/19: NE added 12/22: Markedly elevated PA pressures, elevated PCWP and elevated SVR. Titrated off NE. Added DBA.  12/23: Impella 5.5 Implanted.  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 Heparin  > Bival.  12/31: HM III LVAD implant 1/5: LVAD speed increased to 5400  1/6: LUE weakness, head CT negative  1/9: Ramp echo, speed increased to 5500 rpm  Subjective:    S/P HM3 LVAD 10/14/24.  Co-ox 60%. CVP 4-6. Off milrinone . Net negative 2.5L. At pre-op weight. Walked today.  Remains in AFL. Plan for TEE/DCCV today.   LVAD INTERROGATION:  HeartMate IIl LVAD:  Flow 4.6 liters/min, speed 5500, power 4, PI 2.4. Numerous PI events overnight  Objective:    Weight Range: 97.1 kg Body mass index is 24.74 kg/m.   Vital Signs:   Temp:  [97.7 F (36.5 C)-98.3 F (36.8 C)] 97.7 F (36.5 C) (01/12 0622) Pulse Rate:  [84-186] 153 (01/12 0600) Resp:  [7-23] 14 (01/12 0600) BP: (75-92)/(53-75) 79/57 (01/12 0600) SpO2:  [76 %-97 %] 93 % (01/12 0600) Weight:  [97.1 kg] 97.1 kg (01/12 0500) Last BM Date : 10/25/24 (Per report)  Doppler MAP: 70-80s  Weight change: Filed Weights   10/23/24 0500 10/24/24 0613 10/26/24 0500  Weight: 101.7 kg 100.6 kg 97.1 kg   Intake/Output:  Intake/Output Summary (Last 24 hours) at 10/26/2024 0724 Last data filed at 10/26/2024 0500 Gross per 24 hour  Intake 603.37 ml  Output 3150 ml  Net  -2546.63 ml    Physical Exam   General: Well appearing. No distress on RA Cardiac: JVP flat. Mechanical heart sounds with LVAD hum present.  Driveline: Dressing C/D/I. No drainage or redness. Anchor in place. Extremities: Warm and dry. No edema. Neuro: Alert and oriented x3. Affect pleasant. Moves all extremities without difficulty.  Telemetry   AFL 80-90s (personally reviewed)  Labs   CBC Recent Labs    10/25/24 0659 10/26/24 0429  WBC 11.8* 11.1*  NEUTROABS 10.5* 9.6*  HGB 9.0* 9.2*  HCT 28.5* 27.7*  MCV 96.6 95.2  PLT 154 160   Basic Metabolic Panel Recent Labs    98/88/73 0659 10/25/24 1300 10/26/24 0429  NA 129* 126* 133*  K 3.4* 4.7 3.1*  CL 89* 87* 90*  CO2 30 30 32  GLUCOSE 146* 189* 160*  BUN 33* 29* 32*  CREATININE 1.44* 1.27* 1.33*  CALCIUM  8.4* 8.2* 8.0*  MG 1.9  --  2.0  PHOS 2.5 2.5 3.2   Liver Function Tests Recent Labs    10/25/24 1300 10/26/24 0429  ALBUMIN  3.0* 2.9*   BNP (last 3 results) Recent Labs    06/10/24 1213 07/09/24 1649 07/23/24 0959  BNP 1,071.5* 3,316.0* 595.8*   ProBNP (last 3 results) Recent Labs    10/01/24 1030 10/15/24 0400 10/21/24 0433  PROBNP 8,374.0* 9,508.0* 5,277.0*   Medications:    Scheduled Medications:  Chlorhexidine  Gluconate Cloth  6 each Topical Daily   digoxin   0.0625 mg Oral Daily   gabapentin   200 mg Oral QHS   insulin  aspart  0-15 Units Subcutaneous TID WC   insulin  aspart  0-5 Units Subcutaneous QHS   insulin  aspart  4 Units Subcutaneous TID WC   insulin  glargine  12 Units Subcutaneous Daily   lactose free nutrition  237 mL Oral TID BM   melatonin  3 mg Oral QHS   mexiletine  150 mg Oral Q12H   pantoprazole   40 mg Oral Daily   polycarbophil  625 mg Oral Daily   sildenafil   20 mg Oral TID   sodium chloride  flush  10-40 mL Intracatheter Q12H   spironolactone   50 mg Oral Daily   torsemide   40 mg Oral BID   Warfarin - Pharmacist Dosing Inpatient   Does not apply q1600     Infusions:  amiodarone  30 mg/hr (10/26/24 0500)   potassium chloride  10 mEq (10/26/24 0657)    PRN Medications: dextrose , guaiFENesin -dextromethorphan , ondansetron  (ZOFRAN ) IV, mouth rinse, oxyCODONE , phenol, polyethylene glycol, senna, traMADol , traZODone   Assessment/Plan   Acute on chronic systolic CHF, NYHA IV: Nonischemic cardiomyopathy. RHC 12/18 showed elevated filling pressures with mod-severe mixed pulmonary arterial/pulmonary venous hypertension and low CO despite milrinone  0.25, increased to 0.5. Impella 5.5 placed 12/23 for further optimization. - S/p HM III LVAD implant by Dr. Lucas 10/14/24 - iNO off 1/1 - Ramp echo 1/9, increased speed to 5500 rpm.  - Co-ox 60%. Now off milrinone .   - CVP 4-6, hold torsemide  today with PI events. Decrease dosing to daily tomorrow.  - Continue sildenafil  20 mg TID for RV support - Continue digoxin  0.0625 mg daily - increase spironolactone  to 50 mg bid  Pulmonary hypertension: Severe combined pre/post capillary PH on RHC, suspect largely due to venous remodeling. Improved with adequate unloading.  AKI on CKD stage 3: Creatinine stable 1.33  - follow BMP   PVCs/NSVT: Improved. Has Biotronik ICD. - Continue IV amio 30/hr. - Continue mexiletine to 150 mg bid.  - Tachy therapies off for VAD implant. Turn back on prior to d/c   Persistent Atrial fibrillation: Remains in AF rate 90s-100s. - Continue amiodarone  gtt 30 mg/hr.  - aggressive K and Mg supp  - Warfarin, INR 2.2. Dosing d/w PharmD.  - Plan TEE-guided DCCV on today   Meningioma: Monitoring.   Prostate cancer: Treated with radiation.   Thrombocytopenia: suspect consumptive with impella.  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 Stable.  Hypokalemia/ Hypomagnesemia  - aggressive K and Mg supp   - increase spironolactone  50 mg daily.   Deconditioning - continue PT/OT - CIR team following   Transfer out of ICU after  TEE/DCCV.   [name], NP 10/26/2024, 7:25 AM  Advanced Heart Failure Team Pager [phone] (M-F; 7a - 5p)    Please visit [email or web address]: For overnight coverage please call cardiology fellow first. If fellow not available call Shock/ECMO MD on call.  For ECMO / Mechanical Support (Impella, IABP, LVAD) issues call Shock / ECMO MD on call.   Patient seen and examined with the above-signed Advanced Practice Provider and/or Housestaff. I personally reviewed laboratory data, imaging studies and relevant notes. I independently examined the patient and formulated the important aspects of the plan. I have edited the note to reflect any of my changes or salient points. I have personally discussed the plan with the patient and/or family.  Feeling good. Co-ox stable off milrinone . CVP 5.   Having  some PIs after Ramp adjustment.   Remains in AFL  INR 2.2  General:  NAD.  HEENT: normal  Neck: supple. JVP not elevated.  Carotids 2+ bilat; no bruits. No lymphadenopathy or thryomegaly appreciated. Cor: LVAD hum.  Lungs: Clear. Abdomen: obese soft, nontender, non-distended. No hepatosplenomegaly. No bruits or masses. Good bowel sounds. Driveline site clean. Anchor in place.  Extremities: no cyanosis, clubbing, rash. Warm no edema  Neuro: alert & oriented x 3. No focal deficits. Moves all 4 without problem   Progressing well. CVP and co-ox look good. Having more PI events.  Will attempt to pace terminate AFL today. If unable to do so will do TEE/DC-CV  Can move to 2C. CIR consulted  Discussed warfarin dosing with PharmD personally.  VAD interrogated personally. Parameters stable.  Continue to mobilize.   Toribio Fuel, MD  10:14 AM

## 2024-10-26 NOTE — Consult Note (Signed)
 WOC Nurse wound follow up Wound type: Deep Tissue Pressure Injury Measurement: 5cm x 6cm x 0.1cm  Wound bed: one area centrally yellow; otherwise partial thickness skin loss consistent with evolution of DTPI Drainage (amount, consistency, odor) serosanguinous  Periwound: intact  Dressing procedure/placement/frequency: Add Santyl  to the yellow portion of the wound starting 10/27/24.  Continue to top with xeroform and foam. Reapply Santyl  daily  Turn and reposition per hospital policy Advised patient to not use DOUGHNUT any longer and rationale for this  Use pressure redistribution pad when up in the chair  LALM in place while in the ICU, order air mattress if patient transfers out (CIR pending)  WOC Nurse team will follow with you and see patient within 10 days for wound assessments.  Please notify WOC nurses of any acute changes in the wounds or any new areas of concern Janaki Exley [facility] MSN, RN,CWOCN, CNS, CWON-AP [phone]

## 2024-10-26 NOTE — Progress Notes (Signed)
 12 Days Post-Op Procedures (LRB): INSERTION OF IMPLANTABLE LEFT VENTRICULAR ASSIST DEVICE HEARTMATE 3; REMOVAL OF IMPELLA 5.5 (N/A) ECHOCARDIOGRAM, TRANSESOPHAGEAL, INTRAOPERATIVE (N/A) Subjective: Doing well this morning Diuresing well, ambulating more Still in Aflutter Hemodynamics good off all inotropes  Objective: Vital signs in last 24 hours: BP 98/88   Pulse 61   Temp 97.7 F (36.5 C) (Oral)   Resp 17   Ht 6' 6 (1.981 m)   Wt 97.1 kg   SpO2 98%   BMI 24.74 kg/m  Filed Weights   10/23/24 0500 10/24/24 0613 10/26/24 0500  Weight: 101.7 kg 100.6 kg 97.1 kg    Hemodynamic parameters for last 24 hours: CVP:  [0 mmHg-20 mmHg] 5 mmHg  Intake/Output from previous day: 01/11 0701 - 01/12 0700 In: 639.1 [P.O.:237; I.V.:399.7; IV Piggyback:2.4] Out: 3150 [Urine:3150] Intake/Output this shift: Total I/O In: 476.4 [I.V.:228.9; IV Piggyback:247.6] Out: 800 [Urine:800]  Physical Exam: General - Resting comfortably in bed CV - RRR Resp - Unlabored on RA Abd - Soft, ND/NT Ext - No edema  Lab Results:    Latest Ref Rng & Units 10/26/2024    4:29 AM 10/25/2024    6:59 AM 10/24/2024    6:00 AM  CBC  WBC 4.0 - 10.5 K/uL 11.1  11.8  9.6   Hemoglobin 13.0 - 17.0 g/dL 9.2  9.0  8.6   Hematocrit 39.0 - 52.0 % 27.7  28.5  25.9   Platelets 150 - 400 K/uL 160  154  163       Latest Ref Rng & Units 10/26/2024    2:19 PM 10/26/2024    4:29 AM 10/25/2024    1:00 PM  CMP  Glucose 70 - 99 mg/dL 846  839  810   BUN 8 - 23 mg/dL 32  32  29   Creatinine 0.61 - 1.24 mg/dL 8.64  8.66  8.72   Sodium 135 - 145 mmol/L 133  133  126   Potassium 3.5 - 5.1 mmol/L 3.9  3.1  4.7   Chloride 98 - 111 mmol/L 94  90  87   CO2 22 - 32 mmol/L 31  32  30   Calcium  8.9 - 10.3 mg/dL 8.0  8.0  8.2     Assessment/Plan: S/P Procedures (LRB): INSERTION OF IMPLANTABLE LEFT VENTRICULAR ASSIST DEVICE HEARTMATE 3; REMOVAL OF IMPELLA 5.5 (N/A) ECHOCARDIOGRAM, TRANSESOPHAGEAL, INTRAOPERATIVE (N/A) S/p  HM3 and Impella removal on 12/31 INR in goal All tubes and wires are out DCCV today per AHF team Continue to work on mobility   LOS: 25 days    Con RAMAN Allannah Kempen 10/26/2024

## 2024-10-26 NOTE — Progress Notes (Signed)
 TEE/DCCV at bedside: This RN present for the duration of the procedure and immediate post-op period, along with RT, Clark, DO and Bensimhon, MD. VSS before procedure and ETCO2 monitoring in place (2L [place]). Defib pads placed on patient and hooked up to Zoll at bedside. All sedation given per Gretta, DO and Bensimhon, MD who are present at bedside during this procedure.   1440: 2 mg Versed  given   1441: 50 mg Ketamine  given  1442: 50 mg Ketamine  given   1443: 50 mcg Fentanyl  given 1443: 80 mcg Phenylephrine  given  1449: Shock x1 at 150 J -----> NSR (60s), MAP 75  1451: 80 mcg Phenylephrine  given   This RN stayed at bedside and monitored pt. until he aroused and followed commands. Clark, DO present at bedside upon arousal  Pt. VSS, resting conformably in bed

## 2024-10-26 NOTE — Progress Notes (Signed)
 LVAD Coordinator Rounding Note:  Admitted 10/01/24 for optimization prior to LVAD implant. Impella 5.5 placed 10/06/24.  HM 3 LVAD implanted on 10/14/24 by Dr Lucas under destination therapy criteria.  12/18: Milrinone  titrated from 0.25 to 0.5 mcg/kg/min post RHC d/t low-output and pulmonary hypertension 12/19: NE added 12/22: Markedly elevated PA pressures, elevated PCWP and elevated SVR. Titrated off NE. Added DBA.  12/23: Impella 5.5 Implanted.  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 Heparin  > Bival.  12/31: HM III LVAD implant 1/5: LVAD speed increased to 5400  1/6: LUE weakness, head CT negative  1/9: Ramp echo, speed increased to 5500 rpm  Pt lying in bed on my arrival. Tells me that he had a good weekend, is walking a lot in the halls. Continuing to progress with PT. CIR consulted.  Vital signs: Temp: 97.7 HR: 82 Doppler Pressure: 75 Automatic BP: 92/74 (81) O2 Sat: 99% on RA Wt: 235.4>244.3>243.4>234.8>223.5>239.6>220.2>224.2>214 lb    LVAD interrogation reveals:  Speed: 5500 Flow: 5.5 Power: 3.9 w PI: 2.3   Alarms: none Events: 0-10 PI events with some speed drops Hematocrit: 27  Fixed speed: 5500 Low speed limit: 5200   Drive Line: Existing VAD dressing removed and site care performed using sterile technique. Drive line exit site cleaned with Chlora prep applicators x 2, allowed to dry, and gauze dressing with Silverlon patch applied. Exit site unincorporated, the velour is fully implanted at exit site. Small amount of serous drainage present on previous dressing. No redness, tenderness, foul odor or rash noted. Cath grip anchor secure. Advance to MWF dressing changes by VAD coordinator or nurse champion. Next dressing change due 10/28/24 by VAD coordinator or nurse champion.      Labs:  LDH trend: [phone]  INR trend: 1.3>1.4>1.4>1.6>1.7>1.7>2.0>2.1>2.2  Anticoagulation Plan: - INR Goal: 2.0 - 2.5 - Coumadin  dosing per pharmacy  Blood  Products:  Intra-op 10/14/24:   4 FFP  2 platelets  700 cellsaver  DDAVP  x 2 Post-op:  10/15/24: 1 PRBC  Device: - Biotronik -Therapies: OFF  Arrythmias: PVCs/NSVT- currently on Amiodarone  drip. On Mexiletine 200 mg TID.  Respiratory: extubated 10/15/24  Infection:   Renal:  - BUN/CRT: 38/1.33  Drips:  Milrinone  0.125 mcg/kg/min-off Amiodarone  30 mg/hr  Adverse Events on VAD:  Patient Education:  Performed power source change with physical therapy today during session  Plan/Recommendations:  1. Page VAD coordinator for any equipment or drive line concerns 2. Advance to Monday, Wednesday, Friday drive line dressing changes by VAD coordinator or nurse champion  Lauraine Ip RN, BSN VAD Coordinator 24/7 Pager [phone]

## 2024-10-27 DIAGNOSIS — I429 Cardiomyopathy, unspecified: Secondary | ICD-10-CM | POA: Diagnosis not present

## 2024-10-27 DIAGNOSIS — I5023 Acute on chronic systolic (congestive) heart failure: Secondary | ICD-10-CM | POA: Diagnosis not present

## 2024-10-27 DIAGNOSIS — I480 Paroxysmal atrial fibrillation: Secondary | ICD-10-CM | POA: Diagnosis not present

## 2024-10-27 DIAGNOSIS — J9601 Acute respiratory failure with hypoxia: Secondary | ICD-10-CM | POA: Diagnosis not present

## 2024-10-27 LAB — RENAL FUNCTION PANEL
Albumin: 2.8 g/dL — ABNORMAL LOW (ref 3.5–5.0)
Anion gap: 9 (ref 5–15)
BUN: 25 mg/dL — ABNORMAL HIGH (ref 8–23)
CO2: 29 mmol/L (ref 22–32)
Calcium: 7.9 mg/dL — ABNORMAL LOW (ref 8.9–10.3)
Chloride: 96 mmol/L — ABNORMAL LOW (ref 98–111)
Creatinine, Ser: 1.12 mg/dL (ref 0.61–1.24)
GFR, Estimated: >60 mL/min
Glucose, Bld: 157 mg/dL — ABNORMAL HIGH (ref 70–99)
Phosphorus: 2.7 mg/dL (ref 2.5–4.6)
Potassium: 3.4 mmol/L — ABNORMAL LOW (ref 3.5–5.1)
Sodium: 134 mmol/L — ABNORMAL LOW (ref 135–145)

## 2024-10-27 LAB — POCT I-STAT 7, (LYTES, BLD GAS, ICA,H+H)
Acid-Base Excess: 7 mmol/L — ABNORMAL HIGH (ref 0.0–2.0)
Bicarbonate: 29.7 mmol/L — ABNORMAL HIGH (ref 20.0–28.0)
Calcium, Ion: 1.12 mmol/L — ABNORMAL LOW (ref 1.15–1.40)
HCT: 29 % — ABNORMAL LOW (ref 39.0–52.0)
Hemoglobin: 9.9 g/dL — ABNORMAL LOW (ref 13.0–17.0)
O2 Saturation: 90 %
Potassium: 3.5 mmol/L (ref 3.5–5.1)
Sodium: 134 mmol/L — ABNORMAL LOW (ref 135–145)
TCO2: 31 mmol/L (ref 22–32)
pCO2 arterial: 35 mmHg (ref 32–48)
pH, Arterial: 7.536 — ABNORMAL HIGH (ref 7.35–7.45)
pO2, Arterial: 51 mmHg — ABNORMAL LOW (ref 83–108)

## 2024-10-27 LAB — CBC WITH DIFFERENTIAL/PLATELET
Abs Immature Granulocytes: 0.09 K/uL — ABNORMAL HIGH (ref 0.00–0.07)
Basophils Absolute: 0 K/uL (ref 0.0–0.1)
Basophils Relative: 0 %
Eosinophils Absolute: 0.1 K/uL (ref 0.0–0.5)
Eosinophils Relative: 1 %
HCT: 26.8 % — ABNORMAL LOW (ref 39.0–52.0)
Hemoglobin: 8.6 g/dL — ABNORMAL LOW (ref 13.0–17.0)
Immature Granulocytes: 1 %
Lymphocytes Relative: 4 %
Lymphs Abs: 0.4 K/uL — ABNORMAL LOW (ref 0.7–4.0)
MCH: 30.9 pg (ref 26.0–34.0)
MCHC: 32.1 g/dL (ref 30.0–36.0)
MCV: 96.4 fL (ref 80.0–100.0)
Monocytes Absolute: 0.8 K/uL (ref 0.1–1.0)
Monocytes Relative: 9 %
Neutro Abs: 8.4 K/uL — ABNORMAL HIGH (ref 1.7–7.7)
Neutrophils Relative %: 85 %
Platelets: 173 K/uL (ref 150–400)
RBC: 2.78 MIL/uL — ABNORMAL LOW (ref 4.22–5.81)
RDW: 19.2 % — ABNORMAL HIGH (ref 11.5–15.5)
WBC: 9.9 K/uL (ref 4.0–10.5)
nRBC: 0 % (ref 0.0–0.2)

## 2024-10-27 LAB — GLUCOSE, CAPILLARY
Glucose-Capillary: 80 mg/dL (ref 70–99)
Glucose-Capillary: 149 mg/dL — ABNORMAL HIGH (ref 70–99)
Glucose-Capillary: 117 mg/dL — ABNORMAL HIGH (ref 70–99)
Glucose-Capillary: 116 mg/dL — ABNORMAL HIGH (ref 70–99)

## 2024-10-27 LAB — MAGNESIUM: Magnesium: 1.8 mg/dL (ref 1.7–2.4)

## 2024-10-27 LAB — PROTIME-INR
INR: 2.2 — ABNORMAL HIGH (ref 0.8–1.2)
Prothrombin Time: 26 s — ABNORMAL HIGH (ref 11.4–15.2)

## 2024-10-27 LAB — COOXEMETRY PANEL
Carboxyhemoglobin: 2.6 % — ABNORMAL HIGH (ref 0.5–1.5)
Methemoglobin: 0.8 % (ref 0.0–1.5)
O2 Saturation: 62.1 %
Total hemoglobin: 9.3 g/dL — ABNORMAL LOW (ref 12.0–16.0)

## 2024-10-27 LAB — LACTATE DEHYDROGENASE: LDH: 295 U/L — ABNORMAL HIGH (ref 105–235)

## 2024-10-27 LAB — DIGOXIN LEVEL: Digoxin Level: 0.9 ng/mL (ref 0.8–2.0)

## 2024-10-27 MED ORDER — AMIODARONE HCL 200 MG PO TABS
200.0000 mg | ORAL_TABLET | Freq: Two times a day (BID) | ORAL | Status: DC
  Administered 2024-10-27 – 2024-11-02 (×13): 200 mg via ORAL
  Filled 2024-10-27 (×13): qty 1

## 2024-10-27 MED ORDER — INSULIN GLARGINE 100 UNIT/ML ~~LOC~~ SOLN
8.0000 [IU] | Freq: Every day | SUBCUTANEOUS | Status: DC
  Administered 2024-10-27: 8 [IU] via SUBCUTANEOUS
  Filled 2024-10-27 (×2): qty 0.08

## 2024-10-27 MED ORDER — SENNOSIDES-DOCUSATE SODIUM 8.6-50 MG PO TABS
1.0000 | ORAL_TABLET | Freq: Every day | ORAL | Status: DC
  Administered 2024-10-27 – 2024-10-31 (×3): 1 via ORAL
  Filled 2024-10-27 (×3): qty 1

## 2024-10-27 MED ORDER — POTASSIUM CHLORIDE 20 MEQ PO PACK
40.0000 meq | PACK | Freq: Two times a day (BID) | ORAL | Status: DC
  Administered 2024-10-27 – 2024-10-28 (×3): 40 meq via ORAL
  Filled 2024-10-27 (×3): qty 2

## 2024-10-27 MED ORDER — MAGNESIUM SULFATE 2 GM/50ML IV SOLN
2.0000 g | Freq: Once | INTRAVENOUS | Status: AC
  Administered 2024-10-27: 2 g via INTRAVENOUS
  Filled 2024-10-27: qty 50

## 2024-10-27 MED ORDER — WARFARIN SODIUM 2 MG PO TABS
2.0000 mg | ORAL_TABLET | Freq: Once | ORAL | Status: AC
  Administered 2024-10-27: 2 mg via ORAL
  Filled 2024-10-27: qty 1

## 2024-10-27 MED ORDER — POLYETHYLENE GLYCOL 3350 17 G PO PACK
17.0000 g | PACK | Freq: Every day | ORAL | Status: DC
  Administered 2024-10-27: 17 g via ORAL
  Filled 2024-10-27 (×5): qty 1

## 2024-10-27 MED ORDER — POTASSIUM CHLORIDE 20 MEQ PO PACK
40.0000 meq | PACK | Freq: Once | ORAL | Status: AC
  Administered 2024-10-27: 40 meq via ORAL
  Filled 2024-10-27: qty 2

## 2024-10-27 MED ORDER — FUROSEMIDE 40 MG PO TABS
40.0000 mg | ORAL_TABLET | Freq: Every day | ORAL | Status: DC
  Administered 2024-10-27 – 2024-11-02 (×7): 40 mg via ORAL
  Filled 2024-10-27 (×8): qty 1

## 2024-10-27 MED ORDER — TORSEMIDE 20 MG PO TABS: 40.0000 mg | ORAL_TABLET | Freq: Every day | ORAL | Status: DC

## 2024-10-27 MED ORDER — INSULIN ASPART 100 UNIT/ML IJ SOLN
2.0000 [IU] | Freq: Three times a day (TID) | INTRAMUSCULAR | Status: DC
  Administered 2024-10-29 – 2024-10-31 (×6): 2 [IU] via SUBCUTANEOUS
  Filled 2024-10-27 (×7): qty 2

## 2024-10-27 NOTE — Plan of Care (Signed)
 " Problem: Education: Goal: Understanding of CV disease, CV risk reduction, and recovery process will improve Outcome: Progressing   Problem: Activity: Goal: Ability to return to baseline activity level will improve Outcome: Progressing   Problem: Cardiovascular: Goal: Ability to achieve and maintain adequate cardiovascular perfusion will improve Outcome: Progressing Goal: Vascular access site(s) Level 0-1 will be maintained Outcome: Progressing   Problem: Health Behavior/Discharge Planning: Goal: Ability to safely manage health-related needs after discharge will improve Outcome: Progressing   Problem: Education: Goal: Knowledge of General Education information will improve Description: Including pain rating scale, medication(s)/side effects and non-pharmacologic comfort measures Outcome: Progressing   Problem: Health Behavior/Discharge Planning: Goal: Ability to manage health-related needs will improve Outcome: Progressing   Problem: Clinical Measurements: Goal: Ability to maintain clinical measurements within normal limits will improve Outcome: Progressing Goal: Will remain free from infection Outcome: Progressing Goal: Diagnostic test results will improve Outcome: Progressing Goal: Respiratory complications will improve Outcome: Progressing Goal: Cardiovascular complication will be avoided Outcome: Progressing   Problem: Activity: Goal: Risk for activity intolerance will decrease Outcome: Progressing   Problem: Nutrition: Goal: Adequate nutrition will be maintained Outcome: Progressing   Problem: Coping: Goal: Level of anxiety will decrease Outcome: Progressing   Problem: Elimination: Goal: Will not experience complications related to bowel motility Outcome: Progressing Goal: Will not experience complications related to urinary retention Outcome: Progressing   Problem: Pain Managment: Goal: General experience of comfort will improve and/or be  controlled Outcome: Progressing   Problem: Safety: Goal: Ability to remain free from injury will improve Outcome: Progressing   Problem: Skin Integrity: Goal: Risk for impaired skin integrity will decrease Outcome: Progressing   Problem: Education: Goal: Ability to describe self-care measures that may prevent or decrease complications (Diabetes Survival Skills Education) will improve Outcome: Progressing   Problem: Coping: Goal: Ability to adjust to condition or change in health will improve Outcome: Progressing   Problem: Fluid Volume: Goal: Ability to maintain a balanced intake and output will improve Outcome: Progressing   Problem: Health Behavior/Discharge Planning: Goal: Ability to identify and utilize available resources and services will improve Outcome: Progressing Goal: Ability to manage health-related needs will improve Outcome: Progressing   Problem: Metabolic: Goal: Ability to maintain appropriate glucose levels will improve Outcome: Progressing   Problem: Nutritional: Goal: Maintenance of adequate nutrition will improve Outcome: Progressing Goal: Progress toward achieving an optimal weight will improve Outcome: Progressing   Problem: Skin Integrity: Goal: Risk for impaired skin integrity will decrease Outcome: Progressing   Problem: Tissue Perfusion: Goal: Adequacy of tissue perfusion will improve Outcome: Progressing   Problem: Cardiac: Goal: Ability to achieve and maintain adequate cardiopulmonary perfusion will improve Outcome: Progressing Goal: Vascular access site(s) Level 0-1 will be maintained Outcome: Progressing   Problem: Fluid Volume: Goal: Ability to achieve a balanced intake and output will improve Outcome: Progressing   Problem: Physical Regulation: Goal: Complications related to the disease process, condition or treatment will be avoided or minimized Outcome: Progressing   Problem: Respiratory: Goal: Will regain and/or maintain  adequate ventilation Outcome: Progressing   Problem: Activity: Goal: Ability to tolerate increased activity will improve Outcome: Progressing   Problem: Respiratory: Goal: Ability to maintain a clear airway and adequate ventilation will improve Outcome: Progressing   Problem: Role Relationship: Goal: Method of communication will improve Outcome: Progressing   Problem: Education: Goal: Knowledge of the prescribed therapeutic regimen will improve Outcome: Progressing   Problem: Activity: Goal: Risk for activity intolerance will decrease Outcome: Progressing   Problem: Cardiac: Goal:  Ability to maintain an adequate cardiac output will improve Outcome: Progressing   Problem: Coping: Goal: Level of anxiety will decrease Outcome: Progressing   "

## 2024-10-27 NOTE — Progress Notes (Signed)
 PHARMACY - ANTICOAGULATION CONSULT NOTE  Pharmacy Consult for warfarin Indication: LVAD HM3 + hx AFib  Allergies[1]  Patient Measurements: Height: 6' 6 (198.1 cm) Weight: 97.1 kg (214 lb 1.1 oz) IBW/kg (Calculated) : 91.4 HEPARIN  DW (KG): 108.7  Vital Signs: Temp: 98.2 F (36.8 C) (01/13 0350) Temp Source: Oral (01/13 0350) BP: 89/76 (01/13 0500) Pulse Rate: 64 (01/13 0500)  Labs: Recent Labs    10/25/24 0659 10/25/24 0930 10/25/24 1300 10/26/24 0429 10/26/24 1419 10/27/24 0511 10/27/24 0516  HGB 9.0*  --   --  9.2*  --  9.9* 8.6*  HCT 28.5*  --   --  27.7*  --  29.0* 26.8*  PLT 154  --   --  160  --   --  173  LABPROT  --  27.6*  --  25.6*  --   --  26.0*  INR  --  2.4*  --  2.2*  --   --  2.2*  CREATININE 1.44*  --    < > 1.33* 1.35*  --  1.12   < > = values in this interval not displayed.    Estimated Creatinine Clearance: 87.3 mL/min (by C-G formula based on SCr of 1.12 mg/dL).   Medical History: Past Medical History:  Diagnosis Date   CKD (chronic kidney disease) stage 2, GFR 60-89 ml/min    COPD (chronic obstructive pulmonary disease) (HCC)    Diabetes mellitus type II, non insulin  dependent (HCC)    Dilated aortic root    Elevated PSA    Hypercholesteremia    Hypertension    NICM (nonischemic cardiomyopathy) (HCC)    NSVT (nonsustained ventricular tachycardia) (HCC)    Obstructive sleep apnea    Paroxysmal atrial fibrillation (HCC)    Pulmonary hypertension (HCC)    PVC's (premature ventricular contractions)    Tobacco abuse     Medications:  Scheduled:   amiodarone   200 mg Oral BID   Chlorhexidine  Gluconate Cloth  6 each Topical Daily   collagenase    Topical Daily   digoxin   0.0625 mg Oral Daily   gabapentin   200 mg Oral QHS   insulin  aspart  0-15 Units Subcutaneous TID WC   insulin  aspart  0-5 Units Subcutaneous QHS   insulin  aspart  4 Units Subcutaneous TID WC   insulin  glargine  12 Units Subcutaneous Daily   lactose free nutrition   237 mL Oral TID BM   melatonin  3 mg Oral QHS   mexiletine  150 mg Oral Q12H   pantoprazole   40 mg Oral Daily   polycarbophil  625 mg Oral Daily   potassium chloride   40 mEq Oral BID   sildenafil   20 mg Oral TID   sodium chloride  flush  10-40 mL Intracatheter Q12H   spironolactone   50 mg Oral BID   torsemide   40 mg Oral Daily   Warfarin - Pharmacist Dosing Inpatient   Does not apply q1600    Assessment: 63 yom who underwent LVAD HM3 placement on 12/31. Was on apixaban  PTA for hx Afib > now with LVAD plan to change to warfarin.   INR is therapeutic at 2.2. Hgb 8.6, plt 173, LDH 295. Didn't drink any nutritional supplements yesterday given NPO for TEE/DCCV. Variable oral intake pending on what is ordered. Remains on amio IV.  Goal of Therapy:  INR 2-2.5 Monitor platelets by anticoagulation protocol: Yes   Plan:  Warfarin 2 mg x1 orally tonight   Monitor daily INR, CBC, and for s/sx of  bleeding  F/u PO intake  Thank you for allowing pharmacy to participate in this patient's care,  Suzen Sour, PharmD, BCCCP Clinical Pharmacist  Phone: [phone] 10/27/2024 7:23 AM  Please check AMION for all [facility] Pharmacy phone numbers After 10:00 PM, call Main Pharmacy [phone]       [1] No Known Allergies

## 2024-10-27 NOTE — Progress Notes (Addendum)
 Patient ID: James [name], male   DOB: 21-Nov-1960, 64 y.o.   MRN: [id number]     Advanced Heart Failure Rounding Note   AHF Cardiologist: Dr. Zenaida Chief Complaint: Post-op LVAD Patient Profile   James [name] is a 64 y.o. male with history of stage D cardiomyopathy/chronic systolic heart failure now end-stage w/ inotrope dependence, CKD IIIa, hx VT, PAF, prostate cancer, meningioma.   Admitted for LVAD implant/ pre-VAD HF optimization.   Significant events:   12/18: Milrinone  titrated from 0.25 to 0.5 mcg/kg/min post RHC d/t low-output and pulmonary hypertension 12/19: NE added 12/22: Markedly elevated PA pressures, elevated PCWP and elevated SVR. Titrated off NE. Added DBA.  12/23: Impella 5.5 Implanted.  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 Heparin  > Bival.  12/31: HM III LVAD implant 1/5: LVAD speed increased to 5400  1/6: LUE weakness, head CT negative  1/9: Ramp echo, speed increased to 5500 rpm 1/11: Off milrinone . 1/12: bedside TEE/DCCV. EF 20%, mod/sev HK. VAD speed decreased to 5400  Subjective:    S/P HM3 LVAD 10/14/24.  Co-ox 62%. MAP 70-80s. CVP 5.  Net even. Weight down. Remains NSR.   Lying in bed, did not sleep well last night. He is mildly delirious. No shortness of breath or chest pain. Has not had a bowel movement since Saturday. Ate breakfast this morning, reports that he does have a appetite.   LVAD INTERROGATION:  HeartMate IIl LVAD:  Flow 5.1 liters/min, speed 5400, power 4, PI 3.2, No PI events  Objective:    Weight Range: 97.1 kg Body mass index is 24.74 kg/m.   Vital Signs:   Temp:  [97.7 F (36.5 C)-98.2 F (36.8 C)] 98.2 F (36.8 C) (01/13 0350) Pulse Rate:  [60-163] 64 (01/13 0500) Resp:  [3-32] 14 (01/13 0500) BP: (79-121)/(56-105) 89/76 (01/13 0500) SpO2:  [91 %-100 %] 96 % (01/13 0500) Last BM Date : 10/25/24  Doppler MAP: 70-80s  Weight change: Filed Weights   10/23/24 0500 10/24/24 0613 10/26/24 0500  Weight: 101.7 kg 100.6 kg  97.1 kg   Intake/Output:  Intake/Output Summary (Last 24 hours) at 10/27/2024 0714 Last data filed at 10/27/2024 0500 Gross per 24 hour  Intake 916.46 ml  Output 1300 ml  Net -383.54 ml    Physical Exam   General: Well appearing. No distress on RA Cardiac: JVP flat. Mechanical heart sounds with LVAD hum present.  Driveline: Dressing C/D/I. No drainage or redness. Anchor in place. Extremities: Warm and dry. No edema. Neuro: Alert and oriented x3. Affect pleasant. Moves all extremities without difficulty.  Telemetry   AFL 80-90s (personally reviewed)  Labs   CBC Recent Labs    10/26/24 0429 10/27/24 0511 10/27/24 0516  WBC 11.1*  --  9.9  NEUTROABS 9.6*  --  8.4*  HGB 9.2* 9.9* 8.6*  HCT 27.7* 29.0* 26.8*  MCV 95.2  --  96.4  PLT 160  --  173   Basic Metabolic Panel Recent Labs    98/87/73 0429 10/26/24 1419 10/27/24 0511 10/27/24 0516  NA 133* 133* 134* 134*  K 3.1* 3.9 3.5 3.4*  CL 90* 94*  --  96*  CO2 32 31  --  29  GLUCOSE 160* 153*  --  157*  BUN 32* 32*  --  25*  CREATININE 1.33* 1.35*  --  1.12  CALCIUM  8.0* 8.0*  --  7.9*  MG 2.0  --   --  1.8  PHOS 3.2  --   --  2.7  Liver Function Tests Recent Labs    10/26/24 0429 10/27/24 0516  ALBUMIN  2.9* 2.8*   BNP (last 3 results) Recent Labs    06/10/24 1213 07/09/24 1649 07/23/24 0959  BNP 1,071.5* 3,316.0* 595.8*   ProBNP (last 3 results) Recent Labs    10/01/24 1030 10/15/24 0400 10/21/24 0433  PROBNP 8,374.0* 9,508.0* 5,277.0*   Medications:    Scheduled Medications:  Chlorhexidine  Gluconate Cloth  6 each Topical Daily   collagenase    Topical Daily   digoxin   0.0625 mg Oral Daily   gabapentin   200 mg Oral QHS   insulin  aspart  0-15 Units Subcutaneous TID WC   insulin  aspart  0-5 Units Subcutaneous QHS   insulin  aspart  4 Units Subcutaneous TID WC   insulin  glargine  12 Units Subcutaneous Daily   lactose free nutrition  237 mL Oral TID BM   melatonin  3 mg Oral QHS    mexiletine  150 mg Oral Q12H   pantoprazole   40 mg Oral Daily   polycarbophil  625 mg Oral Daily   potassium chloride   40 mEq Oral Daily   sildenafil   20 mg Oral TID   sodium chloride  flush  10-40 mL Intracatheter Q12H   spironolactone   50 mg Oral BID   torsemide   40 mg Oral Daily   Warfarin - Pharmacist Dosing Inpatient   Does not apply q1600    Infusions:  sodium chloride  20 mL/hr at 10/27/24 0500   amiodarone  30 mg/hr (10/27/24 0500)    PRN Medications: dextrose , guaiFENesin -dextromethorphan , ondansetron  (ZOFRAN ) IV, mouth rinse, oxyCODONE , phenol, polyethylene glycol, senna, traMADol , traZODone   Assessment/Plan   Acute on chronic systolic CHF, NYHA IV: Nonischemic cardiomyopathy. RHC 12/18 showed elevated filling pressures with mod-severe mixed pulmonary arterial/pulmonary venous hypertension and low CO despite milrinone  0.25, increased to 0.5. Impella 5.5 placed 12/23 for further optimization. - S/p HM III LVAD implant by Dr. Lucas 10/14/24. - iNO off 1/1; Milrinone  off 1/11. Co-ox stable.  - Ramp echo 1/9, increased speed to 5500 rpm. TEE 1/12 decreased speed to 5400. - CVP 5. PI events improved with holding torsemide . Start Lasix  40 mg daily.  - Continue sildenafil  20 mg TID for RV support - Continue digoxin  0.0625 mg daily. Dig level today - Continue spironolactone  to 50 mg bid - MAPs 70-80s  Pulmonary hypertension: Severe combined pre/post capillary PH on RHC, suspect largely due to venous remodeling. Improved with adequate unloading.  AKI on CKD stage 3: Creatinine stable 1.33  - follow BMP   PVCs/NSVT: Improved. Has Biotronik ICD. - Continue mexiletine to 150 mg bid.  - Tachy therapies off for VAD implant. Turn back on prior to d/c   Persistent Atrial fibrillation: Remains in AFL rate 90s-100s. - S/P DCCV 1/12.  - Stop IV amio; start amio 200 mg bid - On warfarin - aggressive K and Mg supp   Meningioma: Monitoring.   Prostate cancer: Treated with radiation.    Thrombocytopenia: suspect consumptive with impella.  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 If fellow not available call Shock/ECMO MD on call.  For ECMO / Mechanical Support (Impella, IABP, LVAD) issues call Shock / ECMO MD on call.     Patient seen and examined with the above-signed Advanced Practice Provider and/or Housestaff. I personally reviewed laboratory data, imaging studies and relevant notes. I independently examined the patient and formulated the important aspects of the plan. I have edited the note to reflect any of my changes or salient points. I have personally discussed the plan with the patient and/or family.  Remains in NSR after DC-CV yesterday. LVAD speed reduced and diuretics held.  CVP /co-ox ok. Denies CP or SOB   General:  NAD.  HEENT: normal  Neck: supple. JVP not elevated.  Carotids 2+ bilat; no bruits. No lymphadenopathy or thryomegaly appreciated. Cor: LVAD hum.  Lungs: Clear. Abdomen: obese soft, nontender, non-distended. No hepatosplenomegaly. No bruits or masses. Good bowel sounds. Driveline site clean. Anchor in place.  Extremities: no cyanosis, clubbing, rash. Warm no edema  Neuro: alert & oriented x 3. No focal deficits. Moves all 4 without problem   Continues to improve. LVAD parameters improved with speed adjustment and holding of diuretics.   Will transfer to [facility] today.  INR 2.2. Discussed warfarin dosing with PharmD personally.  James Fuel, MD  10:01 AM

## 2024-10-27 NOTE — Progress Notes (Signed)
 "  NAME:  James [name], MRN:  [id number], DOB:  April 26, 1961, LOS: 26 ADMISSION DATE:  10/01/2024, CONSULTATION DATE:  10/14/24 REFERRING MD:  Lucas , CHIEF COMPLAINT:  s/p LVAD   History of Present Illness:  64 yo M with stg D cardiomyopathy + inotrope dependence, CKD 3a, hx VT, pAFib, meningioma who was admitted to HF service 10/01/24 for pre-VAD optimization  This included: RHC, milrinone  titration, impella placement, levo and DBA titration and volume optimization  On 10/14/24 he went for LVAD  Pump: 116 min  EBL: 750 Product: 2 plt 4 ffp 708 cellsaver  PCCM is consulted post op ICU management.  Pertinent  Medical History  HFrEF Inotrope dependence VT pAFib  Prostate cancer Meningioma  HTN DM2   Significant Hospital Events: Including procedures, antibiotic start and stop dates in addition to other pertinent events    10/01/24 RHC w low output and pHTN, and subsequent uptitration of milrinone  from 0.25 to 0.5 10/02/24 started ND 10/05/24 high PA pressures, hignPCWP high SVR. 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 Hep to bival 10/13/24 not likely HIT. Milrinone  to 0.375  10/14/24 LVAD. PCCM consult.  10/15/24 extubated, nitric off, 1u prbc, lasix  10/20/24: [name] is out 1/8- Off epi 1/9 milrinone  decreased 1/10 milrinone  off 1/12 TEE, cardioversion> NSR  Interim History / Subjective:  Ate ok last night, walking 3 times yesterday. No BM in a few days, but diarrhea has resolved since fiber was added 3 days ago.  Objective    Blood pressure (!) 89/74, pulse 96, temperature 97.7 F (36.5 C), temperature source Axillary, resp. rate 10, height 6' 6 (1.981 m), weight 97.1 kg, SpO2 98%. CVP:  [0 mmHg-41 mmHg] 3 mmHg      Intake/Output Summary (Last 24 hours) at 10/27/2024 0858 Last data filed at 10/27/2024 0800 Gross per 24 hour  Intake 960.4  ml  Output 1300 ml  Net -339.6 ml   Filed Weights   10/23/24 0500 10/24/24 9386 10/26/24 0500  Weight: 101.7 kg 100.6 kg 97.1 kg    Examination: Physical exam: General: elderly man lying in bed eating breakfast HEENT: Regino Ramirez/AT, eyes anicteric Neuro: slightly lethargic, but arouses to stimulation and answers questions appropriately  Chest: breathing comfortably on RA, CTAB heart: hum of LVAD Abdomen: soft, NT, ND  7.53/35/51/30, 90% saturation- done while sleeping coox 62% Sodium 134 Potassium 3.4 BUN 25 Creatinine 1.12 WBC 9.9 H/H 8.6/26.8 Platelets 173 INR 2.2  Resolved problem list   Leukocytosis Acute hypoxic respiratory failure 2/2 pulmonary edema, deconditioning -  present on admission LUE swelling improving; no DVT on 1/2 Assessment and Plan   Acute on chronic HFrEF, stage D cardiomyopathy s/p LVAD (HM3 10/14/24)  pHTN, acute on chronic RV failure   PVCs, NSVT  pAFib > back in NSR after cardioversion 1/12 -post-op care per TCTS -plan to decrease LVAD flows after TEE due to lack of AV opening -coumadin  dosing per pharmacy, currently therapeutic -IV amiodarone  transitioned to oral -con't digoxin , torsemide , high dose spiro due to high K+ requirements -sildenafil  for PH -con't efforts at mobilizing -trying to encourage normal PO intake; family bringing in food -gabapentin  daily -oxycodone  and tramadol  PRN  Hyponatremia- improving with better PO intake -liberalized diet until PO intake is better  Hypokalemia -K+ scheduled today -con't to monitor daily  CKD 3a> renal function now better than baseline. Had cardiorenal syndrome previously -renally dose meds, avoid  nephrotoxic meds  Hyponatremia  -diuresis -avoid hypotonic fluids -regular diet for now to encourage PO intake  DM2 w hyperglycemia -controlled -glargine decreased to 8 units daily since he got zero SSI  yesterday -mealtime insulin  decreased to 2 units TIDAC -SSI PRN -holding PTA  metformin & farxiga  -goal BG 140-180  Anemia; expected operative blood loss> improving. Has not needed transfusions since POD 1 Thrombocytopenia; not HIT -transfuse for Hb <7 or hemodynamically significant bleeding  Deconditioning -encouraged mobility- 4 laps today is the goal  Diarrhea- resolved -fiber stopped now that he's eating more -encouraged solid food intake -planning for suppository today   Insomnia -trazodone  and melatonin; can d/c these when he goes home  Decubitus ulcer sacrum -OOB mobility, prioritize nutrition, frquent turns   Con't rehab efforts. Hopefully stable for CIR soon.  Labs   CBC: Recent Labs  Lab 10/23/24 0432 10/23/24 0626 10/24/24 0600 10/25/24 0659 10/26/24 0429 10/27/24 0511 10/27/24 0516  WBC 10.7*  --  9.6 11.8* 11.1*  --  9.9  NEUTROABS 9.4*  --  8.2* 10.5* 9.6*  --  8.4*  HGB 8.6*   < > 8.6* 9.0* 9.2* 9.9* 8.6*  HCT 25.9*   < > 25.9* 28.5* 27.7* 29.0* 26.8*  MCV 93.8  --  93.5 96.6 95.2  --  96.4  PLT 167  --  163 154 160  --  173   < > = values in this interval not displayed.    Basic Metabolic Panel: Recent Labs  Lab 10/23/24 0432 10/23/24 0626 10/24/24 0600 10/24/24 1704 10/25/24 0659 10/25/24 1300 10/26/24 0429 10/26/24 1419 10/27/24 0511 10/27/24 0516  NA 129*   < > 129*  --  129* 126* 133* 133* 134* 134*  K 3.0*   < > 2.9*   < > 3.4* 4.7 3.1* 3.9 3.5 3.4*  CL 89*   < > 88*  --  89* 87* 90* 94*  --  96*  CO2 28   < > 31  --  30 30 32 31  --  29  GLUCOSE 97   < > 114*  --  146* 189* 160* 153*  --  157*  BUN 38*   < > 36*  --  33* 29* 32* 32*  --  25*  CREATININE 1.33*   < > 1.35*  --  1.44* 1.27* 1.33* 1.35*  --  1.12  CALCIUM  8.4*   < > 8.5*  --  8.4* 8.2* 8.0* 8.0*  --  7.9*  MG 2.0  --  1.9  --  1.9  --  2.0  --   --  1.8  PHOS 3.0  --  2.8  --  2.5 2.5 3.2  --   --  2.7   < > = values in this interval not displayed.   GFR: Estimated Creatinine Clearance: 87.3 mL/min (by C-G formula based on SCr of 1.12  mg/dL). Recent Labs  Lab 10/24/24 0600 10/25/24 0659 10/26/24 0429 10/27/24 0516  WBC 9.6 11.8* 11.1* 9.9    Liver Function Tests: Recent Labs  Lab 10/24/24 0600 10/25/24 0659 10/25/24 1300 10/26/24 0429 10/27/24 0516  ALBUMIN  2.9* 2.9* 3.0* 2.9* 2.8*     ABG    Component Value Date/Time   PHART 7.536 (H) 10/27/2024 0511   PCO2ART 35.0 10/27/2024 0511   PO2ART 51 (L) 10/27/2024 0511   HCO3 29.7 (H) 10/27/2024 0511   TCO2 31 10/27/2024 0511   ACIDBASEDEF 1.0 10/17/2024 0438   O2SAT 62.1 10/27/2024 0516  Critical care time:      Leita SHAUNNA Gaskins, DO 10/27/2024 9:17 AM [facility]  For contact information, see Amion. If no response to pager, please call PCCM 2H APP. After hours, 7PM- 7AM, please call on call APP for 2H.     "

## 2024-10-27 NOTE — TOC Progression Note (Signed)
 Transition of Care [facility]) - Progression Note    Patient Details  Name: Dareon Nunziato MRN: [id number] Date of Birth: 1960-11-14  Transition of Care [facility]) CM/SW Contact  [name] Phone Number: [phone] 10/27/2024, 11:04 AM  Clinical Narrative:   HF CSW reviewed patients chart for discharge readiness, patient not medically stable for d/c. CIR continue to follow. ICM will continue to follow.   HF CSW/CM will continue to follow and monitor for dc readiness.       Expected Discharge Plan: IP Rehab Facility Barriers to Discharge: Continued Medical Work up               Expected Discharge Plan and Services   Discharge Planning Services: CM Consult Post Acute Care Choice: Home Health Living arrangements for the past 2 months: Single Family Home Expected Discharge Date: 10/25/24                                     Social Drivers of Health (SDOH) Interventions SDOH Screenings   Food Insecurity: No Food Insecurity (10/06/2024)  Housing: Low Risk (10/06/2024)  Transportation Needs: No Transportation Needs (10/06/2024)  Utilities: Not At Risk (10/06/2024)  Alcohol Screen: Low Risk (11/18/2023)  Depression (PHQ2-9): Low Risk (08/21/2024)  Tobacco Use: Medium Risk (10/14/2024)    Readmission Risk Interventions    07/10/2024   11:05 AM 10/10/2023   10:37 AM  Readmission Risk Prevention Plan  Transportation Screening Complete Complete  Home Care Screening Complete Complete  Medication Review (RN CM) Complete Complete

## 2024-10-27 NOTE — Progress Notes (Signed)
 Occupational Therapy Treatment Patient Details Name: James [name] MRN: [id number] DOB: 05-16-1961 Today's Date: 10/27/2024   History of present illness Pt is a 64 yo male who was admitted 10/01/24 for pre-VAD optimization prior to planned surgery. S/p R heart cath 12/18 & 12/24, impella placement 12/23, impella removal and HM III LVAD placement 12/31. Extubated 10/15/24. Developed L sided weakness, CT negative for acute infarct. PMH: CKD IIIa, hx VT, PAF, prostate cancer, meningioma, COPD, DM2, HTN, hypercholesteremia, OSA, pulmonary hypertension, tobacco abuse.   OT comments  Coordinated with nsg and PT for OT focus on ADL and power switch and PT to ambulate. Requires min A with bed mobility however mod A +2 to stand from lower surface dur to generalized weakness. Requires overall Max A with ADL tasks.Pt with apparent cognitive deficits, requiring Max verbal and physical assist to organize, sequence, reason/problem through power switch. Recommend lights on/blinds open during daytime hours & increase time OOB in chair with his donut cushion - at least OOB for all meals. Discussion with Garrel regarding need for multiple walks daily in addition to him participating in his ADL tasks in order to progress with rehab. Continue to recommend inpatient follow-up therapy, >3 hours/day. VSS on RA; no c/o dizziness today. Chair set up for lunch. Acute OT to follow.       If plan is discharge home, recommend the following:  A lot of help with walking and/or transfers;A lot of help with bathing/dressing/bathroom;Assistance with cooking/housework;Direct supervision/assist for medications management;Direct supervision/assist for financial management;Assist for transportation;Help with stairs or ramp for entrance   Equipment Recommendations  BSC/3in1    Recommendations for Other Services Rehab consult    Precautions / Restrictions Precautions Precautions: Fall Precaution Booklet Issued: No Recall of  Precautions/Restrictions: Impaired Precaution/Restrictions Comments: LVAD Restrictions Weight Bearing Restrictions Per Provider Order: Yes Other Position/Activity Restrictions: sternal precautions       Mobility Bed Mobility Overal bed mobility: Needs Assistance Bed Mobility: Supine to Sit Rolling: Min assist              Transfers Overall transfer level: Needs assistance Equipment used: Rolling walker (2 wheels) Transfers: Sit to/from Stand Sit to Stand: Mod assist, +2 physical assistance           General transfer comment: mod vc for why he needs to follow sternal precautions during mobility; Mod physical assist to power up form seated position     Balance     Sitting balance-Leahy Scale: Fair Sitting balance - Comments: posterior bias     Standing balance-Leahy Scale: Poor                             ADL either performed or assessed with clinical judgement   ADL Overall ADL's : Needs assistance/impaired Eating/Feeding: Set up   Grooming: Supervision/safety;Set up   Upper Body Bathing: Minimal assistance;Sitting   Lower Body Bathing: Maximal assistance;Sit to/from stand   Upper Body Dressing : Moderate assistance;Sitting Upper Body Dressing Details (indicate cue type and reason): able to place harness over neck     Toilet Transfer: +2 for physical assistance;Moderate assistance;Stand-pivot;Cueing for safety;Cueing for sequencing;BSC/3in1   Toileting- Clothing Manipulation and Hygiene: Maximal assistance       Functional mobility during ADLs: Moderate assistance;Rolling walker (2 wheels);+2 for physical assistance General ADL Comments: working on power switch fom cord ot battery. Requires MAX A to organzie, sequence and maintain attention to task; tried walking before hooked to both batteries -  poor error recognition     Extremity/Trunk Assessment Upper Extremity Assessment Upper Extremity Assessment: Left hand dominant;Generalized  weakness   Lower Extremity Assessment Lower Extremity Assessment: Defer to PT evaluation        Vision   Vision Assessment?: Wears glasses for reading   Perception     Praxis     Communication     Cognition Arousal: Alert Behavior During Therapy: [facility] for tasks assessed/performed Cognition: Cognition impaired     Awareness: Online awareness impaired Memory impairment (select all impairments): Short-term memory, Working civil service fast streamer, Conservation officer, historic buildings Attention impairment (select first level of impairment): Selective attention Executive functioning impairment (select all impairments): Reasoning, Sequencing, Problem solving                   Following commands: Impaired Following commands impaired: Only follows one step commands consistently      Cueing   Cueing Techniques: Verbal cues, Visual cues, Tactile cues  Exercises      Shoulder Instructions       General Comments      Pertinent Vitals/ Pain       Pain Assessment Pain Assessment: Faces Faces Pain Scale: Hurts little more Pain Location: sacral wound Pain Descriptors / Indicators: Discomfort, Sore Pain Intervention(s): Limited activity within patient's tolerance  Home Living                                          Prior Functioning/Environment              Frequency  Min 2X/week        Progress Toward Goals  OT Goals(current goals can now be found in the care plan section)  Progress towards OT goals: Progressing toward goals     Plan      Co-evaluation    PT/OT/SLP Co-Evaluation/Treatment: Yes (partial session)     OT goals addressed during session: ADL's and self-care;Strengthening/ROM      AM-PAC OT 6 Clicks Daily Activity     Outcome Measure   Help from another person eating meals?: A Little Help from another person taking care of personal grooming?: A Little Help from another person toileting, which includes using toliet, bedpan, or  urinal?: A Lot Help from another person bathing (including washing, rinsing, drying)?: A Lot Help from another person to put on and taking off regular upper body clothing?: A Lot Help from another person to put on and taking off regular lower body clothing?: A Lot 6 Click Score: 14    End of Session Equipment Utilized During Treatment: Gait belt;Rolling walker (2 wheels)  OT Visit Diagnosis: Unsteadiness on feet (R26.81);Other abnormalities of gait and mobility (R26.89);Muscle weakness (generalized) (M62.81);Other symptoms and signs involving cognitive function   Activity Tolerance Patient tolerated treatment well   Patient Left Other (comment) (walking with PT)   Nurse Communication Mobility status;Other (comment) (cognitie deficits)        Time: 8962-8877 OT Time Calculation (min): 45 min  Charges: OT General Charges $OT Visit: 1 Visit OT Treatments $Self Care/Home Management : 23-37 mins  Kreg Sink, OT/L   Acute OT Clinical Specialist Acute Rehabilitation Services Pager [phone] Office [phone]   [facility] 10/27/2024, 1:22 PM

## 2024-10-27 NOTE — Progress Notes (Signed)
 Inpatient Rehab Admissions Coordinator:    CIR following at a distance. Not yet ready for CIR at this time.   Leita Kleine, MS, CCC-SLP Rehab Admissions Coordinator  [phone] (celll) [phone] (office)

## 2024-10-27 NOTE — Progress Notes (Signed)
 LVAD Coordinator Rounding Note:  Admitted 10/01/24 for optimization prior to LVAD implant. Impella 5.5 placed 10/06/24.  HM 3 LVAD implanted on 10/14/24 by Dr Lucas under destination therapy criteria.  12/18: Milrinone  titrated from 0.25 to 0.5 mcg/kg/min post RHC d/t low-output and pulmonary hypertension 12/19: NE added 12/22: Markedly elevated PA pressures, elevated PCWP and elevated SVR. Titrated off NE. Added DBA.  12/23: Impella 5.5 Implanted.  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 Heparin  > Bival.  12/31: HM III LVAD implant 1/5: LVAD speed increased to 5400  1/6: LUE weakness, head CT negative  1/9: Ramp echo, speed increased to 5500 rpm 1/11: Off milrinone . 1/12: bedside TEE/DCCV. EF 20%, mod/sev HK. VAD speed decreased to 5400  Pt lying in bed asleep on my arrival. Pt was sedated and cardioverted at bedside yesterday. Speed was dropped to 5400 at this time. Continuing to progress with PT. CIR consulted.  VAD D/c education scheduled for tomorrow at 0930 with pt and his wife James [name].  Vital signs: Temp: 97.7 HR: 73 Doppler Pressure: 76 Automatic BP: 89/74 (80) O2 Sat: 98% on RA Wt: 235.4>244.3>243.4>234.8>223.5>239.6>220.2>224.2>214 lb    LVAD interrogation reveals:  Speed: 5400 Flow: 5.2 Power: 3.9 w PI: 2.9   Alarms: none Events: 50+ yesterday; none today Hematocrit: 27  Fixed speed: 5400 Low speed limit: 5100   Drive Line: CDI. Cath grip anchor secure. Continue MWF dressing changes by VAD coordinator or nurse champion. Next dressing change due 10/28/24 by VAD coordinator or nurse champion.  Labs:  LDH trend: [phone]  INR trend: 1.3>1.4>1.4>1.6>1.7>1.7>2.0>2.1>2.2  Anticoagulation Plan: - INR Goal: 2.0 - 2.5 - Coumadin  dosing per pharmacy  Blood Products:  Intra-op 10/14/24:   4 FFP  2 platelets  700 cellsaver  DDAVP  x 2 Post-op:  10/15/24: 1 PRBC  Device: - Biotronik -Therapies: OFF  Arrythmias: cardioverted from afib on  1/12  Respiratory: extubated 10/15/24  Infection:   Renal:  - BUN/CRT: 38/1.33  Drips:  Milrinone  0.125 mcg/kg/min-off Amiodarone  30 mg/hr-off  Adverse Events on VAD:  Patient Education:  Performed power source change with physical therapy today during session  Plan/Recommendations:  1. Page VAD coordinator for any equipment or drive line concerns 2. Advance to Monday, Wednesday, Friday drive line dressing changes by VAD coordinator or nurse champion  Lauraine Ip RN, BSN VAD Coordinator 24/7 Pager [phone]

## 2024-10-27 NOTE — Progress Notes (Signed)
 Physical Therapy Treatment Patient Details Name: James [name] MRN: [id number] DOB: [birth year] Today's Date: 10/27/2024   History of Present Illness Pt is a 64 yo male who was admitted 10/01/24 for pre-VAD optimization prior to planned surgery. S/p R heart cath 12/18 & 12/24, impella placement 12/23, impella removal and HM III LVAD placement 12/31. Extubated 10/15/24. Developed L sided weakness, CT negative for acute infarct. PMH: CKD IIIa, hx VT, PAF, prostate cancer, meningioma, COPD, DM2, HTN, hypercholesteremia, OSA, pulmonary hypertension, tobacco abuse.    PT Comments  Pt continues to have poor understanding of what has happened to him and the ramifications of having an LVAD. PT/OT provided maximal education about sternal incision and related sternal precautions. OT worked with pt on change from wall to battery power pt needing max A to perform. Pt not able remember the 3 items that he has on him when he is on battery power and why he would need a bag with him with replacements. Pt able to walk with RW today with better upright posture than with EVA walker. D/c plan remains appropriate at this time. PT will continue to work with pt acutely.   If plan is discharge home, recommend the following: Assistance with cooking/housework;Direct supervision/assist for medications management;Direct supervision/assist for financial management;Assist for transportation;Help with stairs or ramp for entrance;Supervision due to cognitive status;A lot of help with walking and/or transfers;A lot of help with bathing/dressing/bathroom   Can travel by private vehicle      Maybe  Equipment Recommendations  Rolling walker (2 wheels);BSC/3in1 (needs elongated bowl on BSC due to sacral wound pending progress; likely to progress to rollator)    Recommendations for Other Services Rehab consult     Precautions / Restrictions Precautions Precautions: Fall Precaution Booklet Issued: No Recall of  Precautions/Restrictions: Impaired Precaution/Restrictions Comments: LVAD Restrictions Weight Bearing Restrictions Per Provider Order: Yes Other Position/Activity Restrictions: sternal precautions,     Mobility  Bed Mobility Overal bed mobility: Needs Assistance Bed Mobility: Supine to Sit Rolling: Min assist         General bed mobility comments: pt sitting up EoB    Transfers Overall transfer level: Needs assistance Equipment used: Rolling walker (2 wheels) Transfers: Sit to/from Stand Sit to Stand: Mod assist, +2 physical assistance           General transfer comment: mod vc for why he needs to follow sternal precautions during mobility; Mod physical assist to power up form seated position    Ambulation/Gait Ambulation/Gait assistance: Min assist, +2 safety/equipment Gait Distance (Feet): 380 Feet Assistive device: Rolling walker (2 wheels) Gait Pattern/deviations: Decreased stride length, Step-through pattern Gait velocity: reduced Gait velocity interpretation: <1.8 ft/sec, indicate of risk for recurrent falls   General Gait Details: with use of the RW pt able to keep a more upright posture, has difficulty with wayfinding almost walking into a room that is not his. pt needs assistance with RW management to back up to sit in the recliner, RN pushed IV pole      Balance Overall balance assessment: Needs assistance   Sitting balance-Leahy Scale: Fair Sitting balance - Comments: posterior bias   Standing balance support: Bilateral upper extremity supported Standing balance-Leahy Scale: Poor Standing balance comment: RW                            Communication Communication Communication: No apparent difficulties  Cognition Arousal: Alert Behavior During Therapy: WFL for tasks assessed/performed   PT -  Cognitive impairments: Problem solving, Safety/Judgement, Awareness, Memory                       PT - Cognition Comments: could not  remember why he could not push up through his arms to sit up, despite maximal cuing can not remember contents of his go bac. Following commands: Impaired Following commands impaired: Only follows one step commands consistently    Cueing Cueing Techniques: Verbal cues, Visual cues, Tactile cues     General Comments General comments (skin integrity, edema, etc.): pt continues to have confusion with changing from wall to batteries. Pt asks about whether he will need to take all this home with him. And how he is going to hunt with it.      Pertinent Vitals/Pain Pain Assessment Pain Assessment: Faces Faces Pain Scale: Hurts little more Pain Location: sacral wound Pain Descriptors / Indicators: Discomfort, Sore Pain Intervention(s): Limited activity within patient's tolerance, Monitored during session, Repositioned     PT Goals (current goals can now be found in the care plan section) Progress towards PT goals: Progressing toward goals    Frequency    Min 3X/week       Co-evaluation PT/OT/SLP Co-Evaluation/Treatment: Yes Reason for Co-Treatment: Complexity of the patient's impairments (multi-system involvement);For patient/therapist safety;To address functional/ADL transfers PT goals addressed during session: Mobility/safety with mobility;Balance;Proper use of DME OT goals addressed during session: ADL's and self-care;Strengthening/ROM      AM-PAC PT 6 Clicks Mobility   Outcome Measure  Help needed turning from your back to your side while in a flat bed without using bedrails?: A Little Help needed moving from lying on your back to sitting on the side of a flat bed without using bedrails?: A Little Help needed moving to and from a bed to a chair (including a wheelchair)?: A Lot Help needed standing up from a chair using your arms (e.g., wheelchair or bedside chair)?: A Lot Help needed to walk in hospital room?: A Lot Help needed climbing 3-5 steps with a railing? : A Lot 6  Click Score: 14    End of Session Equipment Utilized During Treatment: Gait belt Activity Tolerance: Patient tolerated treatment well Patient left: in chair;with call bell/phone within reach;with family/visitor present;with nursing/sitter in room Nurse Communication: Mobility status PT Visit Diagnosis: Unsteadiness on feet (R26.81);Other abnormalities of gait and mobility (R26.89);Muscle weakness (generalized) (M62.81);Difficulty in walking, not elsewhere classified (R26.2);Pain Pain - part of body:  (sternum)     Time: 8891-8868 PT Time Calculation (min) (ACUTE ONLY): 23 min  Charges:    $Gait Training: 8-22 mins PT General Charges $$ ACUTE PT VISIT: 1 Visit                     Kimra Kantor B. Fleeta Lapidus PT, DPT Acute Rehabilitation Services Please use secure chat or  Call Office [phone]    Almarie KATHEE Fleeta [facility] 10/27/2024, 2:17 PM

## 2024-10-28 ENCOUNTER — Encounter (HOSPITAL_COMMUNITY): Payer: Self-pay | Admitting: Cardiology

## 2024-10-28 DIAGNOSIS — I429 Cardiomyopathy, unspecified: Secondary | ICD-10-CM | POA: Diagnosis not present

## 2024-10-28 DIAGNOSIS — I5023 Acute on chronic systolic (congestive) heart failure: Secondary | ICD-10-CM | POA: Diagnosis not present

## 2024-10-28 DIAGNOSIS — R739 Hyperglycemia, unspecified: Secondary | ICD-10-CM

## 2024-10-28 DIAGNOSIS — I480 Paroxysmal atrial fibrillation: Secondary | ICD-10-CM | POA: Diagnosis not present

## 2024-10-28 DIAGNOSIS — E871 Hypo-osmolality and hyponatremia: Secondary | ICD-10-CM

## 2024-10-28 DIAGNOSIS — J9601 Acute respiratory failure with hypoxia: Secondary | ICD-10-CM | POA: Diagnosis not present

## 2024-10-28 LAB — COOXEMETRY PANEL
Carboxyhemoglobin: 2.7 % — ABNORMAL HIGH (ref 0.5–1.5)
Methemoglobin: <0.7 % (ref 0.0–1.5)
O2 Saturation: 57.4 %
Total hemoglobin: 9.7 g/dL — ABNORMAL LOW (ref 12.0–16.0)

## 2024-10-28 LAB — GLUCOSE, CAPILLARY
Glucose-Capillary: 147 mg/dL — ABNORMAL HIGH (ref 70–99)
Glucose-Capillary: 98 mg/dL (ref 70–99)
Glucose-Capillary: 127 mg/dL — ABNORMAL HIGH (ref 70–99)
Glucose-Capillary: 178 mg/dL — ABNORMAL HIGH (ref 70–99)

## 2024-10-28 LAB — RENAL FUNCTION PANEL
Albumin: 3.1 g/dL — ABNORMAL LOW (ref 3.5–5.0)
Anion gap: 8 (ref 5–15)
BUN: 22 mg/dL (ref 8–23)
CO2: 28 mmol/L (ref 22–32)
Calcium: 8.6 mg/dL — ABNORMAL LOW (ref 8.9–10.3)
Chloride: 101 mmol/L (ref 98–111)
Creatinine, Ser: 1.21 mg/dL (ref 0.61–1.24)
GFR, Estimated: >60 mL/min
Glucose, Bld: 84 mg/dL (ref 70–99)
Phosphorus: 2.7 mg/dL (ref 2.5–4.6)
Potassium: 4.4 mmol/L (ref 3.5–5.1)
Sodium: 137 mmol/L (ref 135–145)

## 2024-10-28 LAB — PROTIME-INR
INR: 2.3 — ABNORMAL HIGH (ref 0.8–1.2)
Prothrombin Time: 26.6 s — ABNORMAL HIGH (ref 11.4–15.2)

## 2024-10-28 LAB — BASIC METABOLIC PANEL WITH GFR
Anion gap: 9 (ref 5–15)
BUN: 26 mg/dL — ABNORMAL HIGH (ref 8–23)
CO2: 29 mmol/L (ref 22–32)
Calcium: 8.3 mg/dL — ABNORMAL LOW (ref 8.9–10.3)
Chloride: 99 mmol/L (ref 98–111)
Creatinine, Ser: 1.41 mg/dL — ABNORMAL HIGH (ref 0.61–1.24)
GFR, Estimated: 56 mL/min — ABNORMAL LOW
Glucose, Bld: 146 mg/dL — ABNORMAL HIGH (ref 70–99)
Potassium: 4.4 mmol/L (ref 3.5–5.1)
Sodium: 136 mmol/L (ref 135–145)

## 2024-10-28 LAB — LACTATE DEHYDROGENASE: LDH: 594 U/L — ABNORMAL HIGH (ref 105–235)

## 2024-10-28 LAB — PRO BRAIN NATRIURETIC PEPTIDE: Pro Brain Natriuretic Peptide: 8170 pg/mL — ABNORMAL HIGH

## 2024-10-28 LAB — MAGNESIUM: Magnesium: 2.2 mg/dL (ref 1.7–2.4)

## 2024-10-28 MED ORDER — POTASSIUM CHLORIDE 20 MEQ PO PACK
40.0000 meq | PACK | Freq: Every day | ORAL | Status: DC
  Administered 2024-10-29 – 2024-11-02 (×5): 40 meq via ORAL
  Filled 2024-10-28 (×5): qty 2

## 2024-10-28 MED ORDER — WARFARIN SODIUM 2 MG PO TABS
2.0000 mg | ORAL_TABLET | Freq: Once | ORAL | Status: AC
  Administered 2024-10-28: 2 mg via ORAL
  Filled 2024-10-28: qty 1

## 2024-10-28 NOTE — Progress Notes (Signed)
 VAD Discharge Teaching Note:  Discharge VAD teaching completed with James [name] and his wife James [name].   The home inspection checklist has been reviewed and no unsafe conditions have been identified. Family reports that there are at least two dedicated grounded, 3-prong outlets with clearly labeled circuit breaker has been established in the bedroom for power module and magazine features editor.   Both patient and caregiver have been trained on the following:  1. HM II LVAD overview of system operations  2. Overview of major lifestyle accommodations and cautions   3. Overview of system components (features and functions) 4. Changing power sources 5. Overview of alerts and alarms 6. How to identify and manage an emergency including when pump is running and when pump has stopped  7. Changing system controller 8. Maintain emergency contact list and medications  The patient and caregiver have successfully demonstrated:  1. Changing power source (from batteries to mobile power unit, mobile power unit to batteries, and replacing batteries) 2. Perform system controller self test  3. Check and charge batteries  4. Change system controller 5. Paged VAD pager and programmed number in phones  A daily flow sheet with patient  weight, temperature,  flow, speed, power, and PI, along with daily self checks on system controller and power module have been performed by patient and caregiver during hospitalization and will also be done daily at home.   The caregiver has been trained on percutaneous lead exit site care, care of the driveline and dressing changes. She/he has performed dressing changes during patient's hospitalization under my supervision with the support of the nursing staff. The importance of lead immobilization has been stressed to patient and caregiver using the attachment device. The caregiver has successfully demonstrated the following: 1. Cleansing site with sterile technique 2. Dressing care and  maintenance  3. Immobilizing driveline  We will need to watch her at least more time in order for her to perform indepedently.  The following routine activities and maintenance have been reviewed with patient and caregiver and both verbalize understanding:  1. Stressed importance of never disconnecting power from both controller power leads at the same time, and never disconnecting both batteries at the same time, or the pump will alarm and eventually stop if no power supply restored.  2. Plug the mobile power unit (MPU) and the universal battery charger (UBC) into properly grounded (3 prong) outlets dedicated to PM use. Do NOT use adapter (cheater plug) for ungrounded outlets or multiple portable socket outlets (power strips) 3. Do not connect the PM or MPU to an outlet controlled by wall switch or the device may not work 4. Transfer from MPU to batteries during [facility] mains power failure. The PM has internal backup battery that will power the pump while you transfer to batteries 5. Keep a backup system controller, charged batteries, battery clips, and flashlight near you during sleep in case of electrical power outage 6. Clean battery, battery clip, and universal battery charger contacts weekly 7. Visually inspect percutaneous lead daily 8. Check cables and connectors when changing power source  9. Rotate batteries; keep all eight batteries charged 10. Always have backup system controller, battery clips, fully charged batteries, and spare fully charged batteries when traveling 11. Re-calibrate batteries every 70 uses; monitor battery life of 36 months or 360 uses; replace batteries at end of battery life   Identified the following changes in activities of daily living with pump:  1. No driving for at least six weeks and then only  if doctor gives permission to do so 2. No tub baths while pump implanted, and shower only if doctor gives permission 3. No swimming or submersion in water  while  implanted with pump 4. Keep all VAD equipment away from water  or moisture 5. Keep all VAD connections clean and dry 6. No contact sports or engage in jumping activities 7. Never have an MRI while implanted with the pump 8. Never leave or store batteries in extremely hot or cold places (such as   trunk of your car), or the battery life will be shortened 9. Call the doctor or hospital contact person if any change in how the pump sounds, feels, or works 10. Plan to sleep only when connected to the mobile power unit. James [name] 11. Keep a backup system controller, charged batteries, battery clips, and flashlight near you during sleep in case of electrical power outage 12. Do not sleep on your stomach 13. Talk with doctor before any long distance travel plans 14. Patient will need antibiotics prior to any dental procedure; instructed to contact VAD coordinator before any dental procedures (including routine cleaning)   Discharge binder given to patient and include the following: 1. List of emergency contacts 2. Wallet card 3. HM III Luggage tags 4. HM III Alarms for Patients and their Caregivers 5. HM III Patient Handbook 6. HM III Patient Education Program DVD 7. Daily diary sheets 8. Warfarin teaching sheets 9. Nosebleed teaching sheets 10. Medications you may and may not take with CHF list  Discussed frequency and importance of INR checks; emphasized importance of maintaining INR goal to prevent clotting and or bleeding issues with pump.  Patient able to answer questions and asked good questions pertaining to warfarin and diet/lifestyle changes necessary to be successful and safe.  Patient will have INR managed by VAD Clinic; current INR goal is 2.0 - 2.5.    The patient has completed a proficiency test for the HM III and all questions have been answered. The pt and family have been instructed to call if any questions, problems, or concerns arise. Pt and caregiver successfully paged VAD coordinator  using VAD pager emergency number and have been instructed to use this number only for emergencies. Patient and caregiver asked appropriate questions, had good interaction with VAD coordinator, and verbalized understanding of above instructions.    Lauraine Ip, RN VAD Coordinator  Office: [phone] 24/7 VAD Pager: [phone]

## 2024-10-28 NOTE — Progress Notes (Signed)
 LVAD Coordinator Rounding Note:  Admitted 10/01/24 for optimization prior to LVAD implant. Impella 5.5 placed 10/06/24.  HM 3 LVAD implanted on 10/14/24 by Dr Lucas under destination therapy criteria.  12/18: Milrinone  titrated from 0.25 to 0.5 mcg/kg/min post RHC d/t low-output and pulmonary hypertension 12/19: NE added 12/22: Markedly elevated PA pressures, elevated PCWP and elevated SVR. Titrated off NE. Added DBA.  12/23: Impella 5.5 Implanted.  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 Heparin  > Bival.  12/31: HM III LVAD implant 1/5: LVAD speed increased to 5400  1/6: LUE weakness, head CT negative  1/9: Ramp echo, speed increased to 5500 rpm 1/11: Off milrinone . 1/12: bedside TEE/DCCV. EF 20%, mod/sev HK. VAD speed decreased to 5400  Pt lying in bed this morning. Much of what he is saying does not make sense. Pt has difficulty managing his equipment. He needs constant reinforcement. Pt ask the same questions about his driveline and controller multiple times. Pt was asking wife about some type of check over and over. She was frustrated by this. Provider team states that pt has been mildly delirious for days. Provider team is stopping sleep aids today. Pt has a room on 2c and is being transferred.  Education complete today with pt and his wife. Ongoing driveline dressing change teaching.  Vital signs: Temp: 98.6 HR: 73 Doppler Pressure: 75 Automatic BP: 91/75 (82) O2 Sat: 98% on RA Wt: 235.4>244.3>243.4>234.8>223.5>239.6>220.2>224.2>214 lb    LVAD interrogation reveals:  Speed: 5400 Flow: 5.1 Power: 3.7 w PI: 2.7   Alarms: none Events: none Hematocrit: 27  Fixed speed: 5400 Low speed limit: 5100   Drive Line: Existing VAD dressing removed and site care performed using sterile technique by caregiver [name] with VAD coordinator observing. Drive line exit site cleaned with Chlora prep applicators x 2, allowed to dry, and gauze dressing with Silverlon patch applied. Exit site  unincorporated, the velour is fully implanted at exit site. Small amount of serosanguinous drainage present on previous dressing. No redness, tenderness, foul odor or rash noted  Cath grip anchor secure. Continue MWF dressing changes by VAD coordinator or nurse champion. Next dressing change due 10/30/24 by VAD coordinator or nurse champion.     Labs:  LDH trend: 703>615>541>598>724>405>366>294>243>295>594  INR trend: 1.3>1.4>1.4>1.6>1.7>1.7>2.0>2.1>2.2>2.3  Anticoagulation Plan: - INR Goal: 2.0 - 2.5 - Coumadin  dosing per pharmacy  Blood Products:  Intra-op 10/14/24:   4 FFP  2 platelets  700 cellsaver  DDAVP  x 2 Post-op:  10/15/24: 1 PRBC  Device: - Biotronik -Therapies: OFF-VAD coordinator reached today 1/14 asking rep to come turn therapies back on  Arrythmias: cardioverted from afib on 1/12  Respiratory: extubated 10/15/24  Infection:   Drips:  Milrinone  0.125 mcg/kg/min-off Amiodarone  30 mg/hr-off  Adverse Events on VAD:  Patient Education:  Completed today. See separate note for details  Plan/Recommendations:  1. Page VAD coordinator for any equipment or drive line concerns 2. Continue Monday, Wednesday, Friday drive line dressing changes by VAD coordinator or nurse champion  Lauraine Ip RN, BSN VAD Coordinator 24/7 Pager [phone]

## 2024-10-28 NOTE — Progress Notes (Signed)
 Inpatient Rehab Admissions Coordinator   CIR following for medical readiness.  Wandalee Gust, MS, CCC-SLP Rehab Admissions Coordinator  [phone] (celll) [phone] (office)

## 2024-10-28 NOTE — Plan of Care (Signed)
" °  Problem: Education: Goal: Understanding of CV disease, CV risk reduction, and recovery process will improve Outcome: Progressing   Problem: Activity: Goal: Ability to return to baseline activity level will improve Outcome: Progressing   Problem: Cardiovascular: Goal: Ability to achieve and maintain adequate cardiovascular perfusion will improve Outcome: Progressing Goal: Vascular access site(s) Level 0-1 will be maintained Outcome: Progressing   Problem: Clinical Measurements: Goal: Ability to maintain clinical measurements within normal limits will improve Outcome: Progressing Goal: Will remain free from infection Outcome: Progressing Goal: Diagnostic test results will improve Outcome: Progressing Goal: Respiratory complications will improve Outcome: Progressing Goal: Cardiovascular complication will be avoided Outcome: Progressing   Problem: Nutrition: Goal: Adequate nutrition will be maintained Outcome: Progressing   Problem: Coping: Goal: Level of anxiety will decrease Outcome: Progressing   Problem: Elimination: Goal: Will not experience complications related to bowel motility Outcome: Progressing Goal: Will not experience complications related to urinary retention Outcome: Progressing   Problem: Pain Managment: Goal: General experience of comfort will improve and/or be controlled Outcome: Progressing   Problem: Safety: Goal: Ability to remain free from injury will improve Outcome: Progressing   Problem: Skin Integrity: Goal: Risk for impaired skin integrity will decrease Outcome: Progressing   Problem: Metabolic: Goal: Ability to maintain appropriate glucose levels will improve Outcome: Progressing   Problem: Nutritional: Goal: Maintenance of adequate nutrition will improve Outcome: Progressing Goal: Progress toward achieving an optimal weight will improve Outcome: Progressing   Problem: Skin Integrity: Goal: Risk for impaired skin integrity will  decrease Outcome: Progressing   Problem: Tissue Perfusion: Goal: Adequacy of tissue perfusion will improve Outcome: Progressing   Problem: Cardiac: Goal: Ability to achieve and maintain adequate cardiopulmonary perfusion will improve Outcome: Progressing Goal: Vascular access site(s) Level 0-1 will be maintained Outcome: Progressing   Problem: Respiratory: Goal: Will regain and/or maintain adequate ventilation Outcome: Progressing   Problem: Activity: Goal: Ability to tolerate increased activity will improve Outcome: Progressing   "

## 2024-10-28 NOTE — Progress Notes (Signed)
 PHARMACY - ANTICOAGULATION CONSULT NOTE  Pharmacy Consult for warfarin Indication: LVAD HM3 + hx AFib  Allergies[1]  Patient Measurements: Height: 6' 6 (198.1 cm) Weight: 97.1 kg (214 lb 1.1 oz) IBW/kg (Calculated) : 91.4 HEPARIN  DW (KG): 108.7  Vital Signs: Temp: 98.6 F (37 C) (01/14 1135) Temp Source: Oral (01/14 1135) BP: 103/82 (01/14 1135) Pulse Rate: 73 (01/14 1000)  Labs: Recent Labs    10/26/24 0429 10/26/24 1419 10/27/24 0511 10/27/24 0516 10/28/24 0559  HGB 9.2*  --  9.9* 8.6*  --   HCT 27.7*  --  29.0* 26.8*  --   PLT 160  --   --  173  --   LABPROT 25.6*  --   --  26.0* 26.6*  INR 2.2*  --   --  2.2* 2.3*  CREATININE 1.33* 1.35*  --  1.12 1.21    Estimated Creatinine Clearance: 80.8 mL/min (by C-G formula based on SCr of 1.21 mg/dL).   Medical History: Past Medical History:  Diagnosis Date   CKD (chronic kidney disease) stage 2, GFR 60-89 ml/min    COPD (chronic obstructive pulmonary disease) (HCC)    Diabetes mellitus type II, non insulin  dependent (HCC)    Dilated aortic root    Elevated PSA    Hypercholesteremia    Hypertension    NICM (nonischemic cardiomyopathy) (HCC)    NSVT (nonsustained ventricular tachycardia) (HCC)    Obstructive sleep apnea    Paroxysmal atrial fibrillation (HCC)    Pulmonary hypertension (HCC)    PVC's (premature ventricular contractions)    Tobacco abuse     Medications:  Scheduled:   amiodarone   200 mg Oral BID   Chlorhexidine  Gluconate Cloth  6 each Topical Daily   collagenase    Topical Daily   digoxin   0.0625 mg Oral Daily   furosemide   40 mg Oral Daily   gabapentin   200 mg Oral QHS   insulin  aspart  0-15 Units Subcutaneous TID WC   insulin  aspart  0-5 Units Subcutaneous QHS   insulin  aspart  2 Units Subcutaneous TID WC   lactose free nutrition  237 mL Oral TID BM   mexiletine  150 mg Oral Q12H   pantoprazole   40 mg Oral Daily   polyethylene glycol  17 g Oral Daily   [START ON 10/29/2024] potassium  chloride  40 mEq Oral Daily   senna-docusate  1 tablet Oral Daily   sildenafil   20 mg Oral TID   sodium chloride  flush  10-40 mL Intracatheter Q12H   spironolactone   50 mg Oral BID   Warfarin - Pharmacist Dosing Inpatient   Does not apply q1600    Assessment: 63 yom who underwent LVAD HM3 placement on 12/31. Was on apixaban  PTA for hx Afib > now with LVAD plan to change to warfarin.   INR is therapeutic at 2.3. Hgb 8.6, plt 173, LDH 295. Diet slowly improving, eating partial meals. Remains on amio IV.  Goal of Therapy:  INR 2-2.5 Monitor platelets by anticoagulation protocol: Yes   Plan:  Warfarin 2 mg x1 orally tonight   Monitor daily INR, CBC, and for s/sx of bleeding  F/u PO intake  Thank you for allowing pharmacy to participate in this patient's care,  Vermell Mccallum, PharmD 10/28/2024 3:45 PM  Please check AMION for all [facility] Pharm[facility] phone numbers After 10:00 PM, call Main Pharmacy [phone]        [1] No Known Allergies

## 2024-10-28 NOTE — Progress Notes (Signed)
 "  NAME:  James [name], MRN:  [id number], DOB:  August 30, 1961, LOS: 27 ADMISSION DATE:  10/01/2024, CONSULTATION DATE:  10/14/24 REFERRING MD:  James [name] , CHIEF COMPLAINT:  s/p LVAD   History of Present Illness:  64 yo M with stg D cardiomyopathy + inotrope dependence, CKD 3a, hx VT, pAFib, meningioma who was admitted to HF service 10/01/24 for pre-VAD optimization  This included: RHC, milrinone  titration, impella placement, levo and DBA titration and volume optimization  On 10/14/24 he went for LVAD  Pump: 116 min  EBL: 750 Product: 2 plt 4 ffp 708 cellsaver  PCCM is consulted post op ICU management.  Pertinent  Medical History  HFrEF Inotrope dependence VT pAFib  Prostate cancer Meningioma  HTN DM2   Significant Hospital Events: Including procedures, antibiotic start and stop dates in addition to other pertinent events    10/01/24 RHC w low output and pHTN, and subsequent uptitration of milrinone  from 0.25 to 0.5 10/02/24 started ND 10/05/24 high PA pressures, hignPCWP high SVR. 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 Hep to bival 10/13/24 not likely HIT. Milrinone  to 0.375  10/14/24 LVAD. PCCM consult.  10/15/24 extubated, nitric off, 1u prbc, lasix  10/20/24: James [name] is out 1/8- Off epi 1/9 milrinone  decreased 1/10 milrinone  off 1/12 TEE, cardioversion> NSR  Interim History / Subjective:  Eating better, no complaints today.  Objective    Blood pressure 91/75, pulse 79, temperature 99.2 F (37.3 C), temperature source Oral, resp. rate (!) 23, height 6' 6 (1.981 m), weight 97.1 kg, SpO2 100%. CVP:  [3 mmHg-54 mmHg] 7 mmHg      Intake/Output Summary (Last 24 hours) at 10/28/2024 0831 Last data filed at 10/27/2024 1855 Gross per 24 hour  Intake 200.8 ml  Output 1200 ml  Net -999.2 ml   Filed Weights   10/23/24 0500 10/24/24 0613 10/26/24  0500  Weight: 101.7 kg 100.6 kg 97.1 kg    Examination: Physical exam: General: elderly man sitting up in bed eating breakfast HEENT: James [name]/AT, eyes anicteric Neuro: awake and alert, moving all extremities Chest: Breathing comfortably on room air, CTAB heart: LVAD hum Abdomen: Soft, nontender, nondistended   coox 57% Sodium 137 Potassium 4.4 BUN 22 Creatinine 1.21 INR 2. 3 BNP 8170 LDH 594  Resolved problem list   Leukocytosis Acute hypoxic respiratory failure 2/2 pulmonary edema, deconditioning -  present on admission LUE swelling improving; no DVT on 1/2 Hyponatremia- improved with better PO intake Assessment and Plan   Acute on chronic HFrEF, stage D cardiomyopathy s/p LVAD (HM3 10/14/24)  pHTN, acute on chronic RV failure   PVCs, NSVT  pAFib > back in NSR after cardioversion 1/12 - Postop care per T CTS - Coumadin  dosing per pharmacy, goal INR 2-3 -Oral amiodarone  - Continue digoxin , torsemide , Hydro spironolactone  - Continue sildenafil  for pulmonary hypertension - Continue mobilizing and encouraging adequate p.o. intake.  Eventually will need to go back to a cardiac diet. -Continue to watch potassium -Daily gabapentin  - LVAD adjustments per advanced heart failure -Monitor LDH, not sure why significant increase today  Hypokalemia -Continue to monitor closely - Continue daily scheduled potassium  CKD 3a> renal function now better than baseline. Had cardiorenal syndrome previously -Renally dose meds, avoid nephrotoxic meds  DM2 w hyperglycemia -controlled - Significantly decreased insulin  requirements since he started eating real food/not just being on a liquid diet - Stop long-acting insulin  - Sliding scale  insulin  as needed - Holding PTA metformin and Farxiga  - Goal blood glucose less than 180  Anemia; expected operative blood loss> improving. Has not needed transfusions since POD 1 Thrombocytopenia; not HIT - No recent transfusions, transfuse for  hemoglobin less than 7 or hemodynamically significant bleeding  Deconditioning - Continue mobility  Diarrhea- resolved- fiber stopped 1/13 -Daily bowel regimen  Insomnia - Continue nightly melatonin and trazodone .  Plan to discontinue these when he goes home.  Decubitus ulcer sacrum -Prior to his nutrition, frequent turns, out of bed mobility   Transferring out of the ICU today.  PCCM will be available as needed.  Labs   CBC: Recent Labs  Lab 10/23/24 0432 10/23/24 0626 10/24/24 0600 10/25/24 0659 10/26/24 0429 10/27/24 0511 10/27/24 0516  WBC 10.7*  --  9.6 11.8* 11.1*  --  9.9  NEUTROABS 9.4*  --  8.2* 10.5* 9.6*  --  8.4*  HGB 8.6*   < > 8.6* 9.0* 9.2* 9.9* 8.6*  HCT 25.9*   < > 25.9* 28.5* 27.7* 29.0* 26.8*  MCV 93.8  --  93.5 96.6 95.2  --  96.4  PLT 167  --  163 154 160  --  173   < > = values in this interval not displayed.    Basic Metabolic Panel: Recent Labs  Lab 10/24/24 0600 10/24/24 1704 10/25/24 0659 10/25/24 1300 10/26/24 0429 10/26/24 1419 10/27/24 0511 10/27/24 0516 10/28/24 0559  NA 129*  --  129* 126* 133* 133* 134* 134* 137  K 2.9*   < > 3.4* 4.7 3.1* 3.9 3.5 3.4* 4.4  CL 88*  --  89* 87* 90* 94*  --  96* 101  CO2 31  --  30 30 32 31  --  29 28  GLUCOSE 114*  --  146* 189* 160* 153*  --  157* 84  BUN 36*  --  33* 29* 32* 32*  --  25* 22  CREATININE 1.35*  --  1.44* 1.27* 1.33* 1.35*  --  1.12 1.21  CALCIUM  8.5*  --  8.4* 8.2* 8.0* 8.0*  --  7.9* 8.6*  MG 1.9  --  1.9  --  2.0  --   --  1.8 2.2  PHOS 2.8  --  2.5 2.5 3.2  --   --  2.7 2.7   < > = values in this interval not displayed.   GFR: Estimated Creatinine Clearance: 80.8 mL/min (by C-G formula based on SCr of 1.21 mg/dL). Recent Labs  Lab 10/24/24 0600 10/25/24 0659 10/26/24 0429 10/27/24 0516  WBC 9.6 11.8* 11.1* 9.9     Critical care time:      James SHAUNNA Gaskins, DO 10/28/2024 8:31 AM [facility]  For contact information, see Amion. If no  response to pager, please call PCCM 2H APP. After hours, 7PM- 7AM, please call on call APP for 2H.     "

## 2024-10-28 NOTE — Progress Notes (Addendum)
 Patient ID: James [name], male   DOB: 1960/12/20, 64 y.o.   MRN: [id number]     Advanced Heart Failure Rounding Note   [facility] Cardiologist: Dr. Zenaida Chief Complaint: Post-op LVAD Patient Profile   James [name] is a 64 y.o. male with history of stage D cardiomyopathy/chronic systolic heart failure now end-stage w/ inotrope dependence, CKD IIIa, hx VT, PAF, prostate cancer, meningioma.   Admitted for LVAD implant/ pre-VAD HF optimization.   Significant events:   12/18: Milrinone  titrated from 0.25 to 0.5 mcg/kg/min post RHC d/t low-output and pulmonary hypertension 12/19: NE added 12/22: Markedly elevated PA pressures, elevated PCWP and elevated SVR. Titrated off NE. Added DBA.  12/23: Impella 5.5 Implanted.  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 Heparin  > Bival.  12/31: HM III LVAD implant 1/5: LVAD speed increased to 5400  1/6: LUE weakness, head CT negative  1/9: Ramp echo, speed increased to 5500 rpm 1/11: Off milrinone . 1/12: bedside TEE/DCCV. EF 20%, mod/sev HK. VAD speed decreased to 5400  Subjective:    S/P HM3 LVAD 10/14/24.  Co-ox 57%. MAP 70s. CVP 7.  Weight down.  INR 2.3  Sitting up in bed. Eating breakfast, walked in the halls this morning.   LVAD INTERROGATION:  HeartMate IIl LVAD:  Flow 4.8 liters/min, speed 5400, power 3.7, PI 4.5, No PI events  Objective:    Weight Range: 97.1 kg Body mass index is 24.74 kg/m.   Vital Signs:   Temp:  [97.7 F (36.5 C)-98.1 F (36.7 C)] 98 F (36.7 C) (01/14 0401) Pulse Rate:  [64-96] 64 (01/14 0100) Resp:  [8-25] 9 (01/14 0100) BP: (78-117)/(59-80) 83/65 (01/14 0100) SpO2:  [94 %-100 %] 96 % (01/14 0100) Last BM Date : 10/25/24  Doppler MAP: 70-80s  Weight change: Filed Weights   10/23/24 0500 10/24/24 0613 10/26/24 0500  Weight: 101.7 kg 100.6 kg 97.1 kg   Intake/Output:  Intake/Output Summary (Last 24 hours) at 10/28/2024 0722 Last data filed at 10/27/2024 1855 Gross per 24 hour  Intake 237.5 ml  Output  1200 ml  Net -962.5 ml    Physical Exam   General: Well appearing. No distress on RA Cardiac: JVP flat. Mechanical heart sounds with LVAD hum present.  Driveline: Dressing C/D/I. No drainage or redness. Anchor in place. Extremities: Warm and dry. Mild peripheral edema. Neuro: Alert and oriented x3. Affect pleasant. Moves all extremities without difficulty.  Telemetry   SR 80s (personally reviewed)  Labs   CBC Recent Labs    10/26/24 0429 10/27/24 0511 10/27/24 0516  WBC 11.1*  --  9.9  NEUTROABS 9.6*  --  8.4*  HGB 9.2* 9.9* 8.6*  HCT 27.7* 29.0* 26.8*  MCV 95.2  --  96.4  PLT 160  --  173   Basic Metabolic Panel Recent Labs    98/86/73 0516 10/28/24 0559  NA 134* 137  K 3.4* 4.4  CL 96* 101  CO2 29 28  GLUCOSE 157* 84  BUN 25* 22  CREATININE 1.12 1.21  CALCIUM  7.9* 8.6*  MG 1.8 2.2  PHOS 2.7 2.7   Liver Function Tests Recent Labs    10/27/24 0516 10/28/24 0559  ALBUMIN  2.8* 3.1*   BNP (last 3 results) Recent Labs    06/10/24 1213 07/09/24 1649 07/23/24 0959  BNP 1,071.5* 3,316.0* 595.8*   ProBNP (last 3 results) Recent Labs    10/15/24 0400 10/21/24 0433 10/28/24 0559  PROBNP 9,508.0* 5,277.0* 8,170.0*   Medications:    Scheduled Medications:  amiodarone   200 mg Oral BID   Chlorhexidine  Gluconate Cloth  6 each Topical Daily   collagenase    Topical Daily   digoxin   0.0625 mg Oral Daily   furosemide   40 mg Oral Daily   gabapentin   200 mg Oral QHS   insulin  aspart  0-15 Units Subcutaneous TID WC   insulin  aspart  0-5 Units Subcutaneous QHS   insulin  aspart  2 Units Subcutaneous TID WC   insulin  glargine  8 Units Subcutaneous Daily   lactose free nutrition  237 mL Oral TID BM   melatonin  3 mg Oral QHS   mexiletine  150 mg Oral Q12H   pantoprazole   40 mg Oral Daily   polyethylene glycol  17 g Oral Daily   potassium chloride   40 mEq Oral BID   senna-docusate  1 tablet Oral Daily   sildenafil   20 mg Oral TID   sodium chloride  flush   10-40 mL Intracatheter Q12H   spironolactone   50 mg Oral BID   Warfarin - Pharmacist Dosing Inpatient   Does not apply q1600    Infusions:    PRN Medications: dextrose , guaiFENesin -dextromethorphan , ondansetron  (ZOFRAN ) IV, mouth rinse, oxyCODONE , phenol, polyethylene glycol, senna, traMADol , traZODone   Assessment/Plan   Acute on chronic systolic CHF, NYHA IV: Nonischemic cardiomyopathy. RHC 12/18 showed elevated filling pressures with mod-severe mixed pulmonary arterial/pulmonary venous hypertension and low CO despite milrinone  0.25, increased to 0.5. Impella 5.5 placed 12/23 for further optimization. - S/p HM III LVAD implant by Dr. Lucas 10/14/24. - iNO off 1/1; Milrinone  off 1/11. Co-ox stable.  - Ramp echo 1/9, increased speed to 5500 rpm. TEE 1/12 decreased speed to 5400. - CVP 7. Continue Lasix  40 mg daily.  - Continue sildenafil  20 mg TID for RV support - Continue digoxin  0.0625 mg daily. Dig level 0.9 1/13 - Continue spironolactone  to 50 mg bid - MAPs 70-80s  Pulmonary hypertension: Severe combined pre/post capillary PH on RHC, suspect largely due to venous remodeling. Improved with adequate unloading.  AKI on CKD stage 3: Creatinine stable 1.33  - follow BMP   PVCs/NSVT: Improved. Has Biotronik ICD. - Continue mexiletine to 150 mg bid.  - Tachy therapies off for VAD implant. Turn back on prior to d/c   Persistent Atrial fibrillation: Remains in AFL rate 90s-100s. - S/P DCCV 1/12.  - Continue amio 200 mg bid - On warfarin - aggressive K and Mg supp   Meningioma: Monitoring.   Prostate cancer: Treated with radiation.   Thrombocytopenia: suspect consumptive with impella.  - HIT and SRA negative - resolved  ABLA:  - stable hgb  Hypokalemia/ Hypomagnesemia  - aggressive K and Mg supp   - spiro 50 mg bid + KCL 40 mEq bid - aldosteron/renin ratio pending  Deconditioning - continue PT/OT - CIR team following   Constipation - resolved   Has transfer  ordered in for 2C awaiting bed. Will need CIR, close to ready.  James Lee, NP 10/28/2024, 7:22 AM  Advanced Heart Failure Team Pager [phone] (M-F; 7a - 5p)    Please visit [email or web address]: For overnight coverage please call cardiology fellow first. If fellow not available call Shock/ECMO MD on call.  For ECMO / Mechanical Support (Impella, IABP, LVAD) issues call Shock / ECMO MD on call.   Patient seen and examined with the above-signed Advanced Practice Provider and/or Housestaff. I personally reviewed laboratory data, imaging studies and relevant notes. I independently examined the patient and formulated the important aspects of the plan. I have edited  the note to reflect any of my changes or salient points. I have personally discussed the plan with the patient and/or family.  Remains in NSR. Vitals and VAD parameters stable.   CVP 7.  Mild delirium is ongoing. No fevers or chills.   General:  NAD.  HEENT: normal  Neck: supple. JVP not elevated.  Carotids 2+ bilat; no bruits. No lymphadenopathy or thryomegaly appreciated. Cor: LVAD hum.  Lungs: Clear. Abdomen: soft, nontender, non-distended. No hepatosplenomegaly. No bruits or masses. Good bowel sounds. Driveline site clean. Anchor in place.  Extremities: no cyanosis, clubbing, rash. Warm no edema  Neuro: alert & oriented x 3. No focal deficits. Moves all 4 without problem   Making slow progress but having some delirium. No infectious symptoms.   Have reviewed meds with PharmD team. Will stop trazodone .   Can move to 2C. Consult CIR  INR 2.3 Discussed warfarin dosing with PharmD personally.  Toribio Fuel, MD  11:48 AM

## 2024-10-29 ENCOUNTER — Inpatient Hospital Stay (HOSPITAL_COMMUNITY)

## 2024-10-29 DIAGNOSIS — R739 Hyperglycemia, unspecified: Secondary | ICD-10-CM

## 2024-10-29 DIAGNOSIS — K5903 Drug induced constipation: Secondary | ICD-10-CM

## 2024-10-29 LAB — GLUCOSE, CAPILLARY
Glucose-Capillary: 131 mg/dL — ABNORMAL HIGH (ref 70–99)
Glucose-Capillary: 164 mg/dL — ABNORMAL HIGH (ref 70–99)
Glucose-Capillary: 91 mg/dL (ref 70–99)
Glucose-Capillary: 150 mg/dL — ABNORMAL HIGH (ref 70–99)

## 2024-10-29 LAB — COOXEMETRY PANEL
Carboxyhemoglobin: 3.1 % — ABNORMAL HIGH (ref 0.5–1.5)
Methemoglobin: <0.7 % (ref 0.0–1.5)
O2 Saturation: 65.4 %
Total hemoglobin: 9.5 g/dL — ABNORMAL LOW (ref 12.0–16.0)

## 2024-10-29 LAB — LACTATE DEHYDROGENASE: LDH: 256 U/L — ABNORMAL HIGH (ref 105–235)

## 2024-10-29 LAB — RENAL FUNCTION PANEL
Albumin: 2.9 g/dL — ABNORMAL LOW (ref 3.5–5.0)
Anion gap: 8 (ref 5–15)
BUN: 21 mg/dL (ref 8–23)
CO2: 28 mmol/L (ref 22–32)
Calcium: 8.4 mg/dL — ABNORMAL LOW (ref 8.9–10.3)
Chloride: 99 mmol/L (ref 98–111)
Creatinine, Ser: 1.2 mg/dL (ref 0.61–1.24)
GFR, Estimated: >60 mL/min
Glucose, Bld: 93 mg/dL (ref 70–99)
Phosphorus: 3 mg/dL (ref 2.5–4.6)
Potassium: 4.1 mmol/L (ref 3.5–5.1)
Sodium: 135 mmol/L (ref 135–145)

## 2024-10-29 LAB — PROTIME-INR
INR: 2.2 — ABNORMAL HIGH (ref 0.8–1.2)
Prothrombin Time: 25.5 s — ABNORMAL HIGH (ref 11.4–15.2)

## 2024-10-29 LAB — AMMONIA: Ammonia: 16 umol/L (ref 9–35)

## 2024-10-29 MED ORDER — WARFARIN SODIUM 2 MG PO TABS
2.0000 mg | ORAL_TABLET | Freq: Once | ORAL | Status: AC
  Administered 2024-10-29: 2 mg via ORAL
  Filled 2024-10-29: qty 1

## 2024-10-29 MED ORDER — ADULT MULTIVITAMIN W/MINERALS CH
1.0000 | ORAL_TABLET | Freq: Every day | ORAL | Status: DC
  Administered 2024-10-30 – 2024-11-02 (×4): 1 via ORAL
  Filled 2024-10-29 (×4): qty 1

## 2024-10-29 NOTE — Progress Notes (Signed)
 Physical Therapy Treatment Patient Details Name: James [name] MRN: [id number] DOB: 06/23/1961 Today's Date: 10/29/2024   History of Present Illness Pt is a 64 yo male who was admitted 10/01/24 for pre-VAD optimization prior to planned surgery. S/p R heart cath 12/18 & 12/24, impella placement 12/23, impella removal and HM III LVAD placement 12/31. Extubated 10/15/24. Developed L sided weakness, CT negative for acute infarct. PMH: CKD IIIa, hx VT, PAF, prostate cancer, meningioma, COPD, DM2, HTN, hypercholesteremia, OSA, pulmonary hypertension, tobacco abuse.    PT Comments  Pt eager to get up and walk with therapy. Continues to need increased cuing for sternal precautions and wall power to battery exchange and back to wall. Pt bed mobility is improving needing supervision to come to EoB and min A to return. Discussed need to make sure he is closer to [facility] when he comes to sitting to decrease need for assistance in coming to standing. Pt requiring min-modAx2. Pt ambulates with RW and min Ax2. D/c recommendation remain appropriate at this time. PT will continue to follow.     If plan is discharge home, recommend the following: Assistance with cooking/housework;Direct supervision/assist for medications management;Direct supervision/assist for financial management;Assist for transportation;Help with stairs or ramp for entrance;Supervision due to cognitive status;A lot of help with walking and/or transfers;A lot of help with bathing/dressing/bathroom   Can travel by private vehicle      Yes  Equipment Recommendations  Rolling walker (2 wheels);BSC/3in1 (needs elongated bowl on BSC due to sacral wound pending progress; likely to progress to rollator)    Recommendations for Other Services Rehab consult     Precautions / Restrictions Precautions Precautions: Fall Recall of Precautions/Restrictions: Impaired Precaution/Restrictions Comments: LVAD Restrictions Weight Bearing Restrictions Per  Provider Order: Yes Other Position/Activity Restrictions: sternal precautions,     Mobility  Bed Mobility Overal bed mobility: Needs Assistance Bed Mobility: Supine to Sit, Sit to Supine     Supine to sit: Supervision Sit to supine: Min assist   General bed mobility comments: pt able to come to EoB with HOB elevated and cues to not pull up, min A for returning LE to bed at end of session    Transfers Overall transfer level: Needs assistance Equipment used: Rolling walker (2 wheels) Transfers: Sit to/from Stand Sit to Stand: Min assist, Mod assist, +2 physical assistance, From elevated surface           General transfer comment: pt continues to have sacral pain prehibiting him from scooting to EoB, and then requiring modAx2 for coming to upright, discussed needing to make sure he is closer to [facility] when coming to sitting, that way he will be able to bend his legs for power up to standing    Ambulation/Gait Ambulation/Gait assistance: Min assist, +2 safety/equipment Gait Distance (Feet): 400 Feet Assistive device: Rolling walker (2 wheels) Gait Pattern/deviations: Decreased stride length, Step-through pattern Gait velocity: reduced Gait velocity interpretation: <1.8 ft/sec, indicate of risk for recurrent falls   General Gait Details: pt continues to benefit from RW to keep upright posture         Balance Overall balance assessment: Needs assistance Sitting-balance support: No upper extremity supported, Feet supported Sitting balance-Leahy Scale: Fair     Standing balance support: Bilateral upper extremity supported Standing balance-Leahy Scale: Fair Standing balance comment: benefits from RW for stabilization                            Communication Communication  Communication: No apparent difficulties  Cognition Arousal: Alert Behavior During Therapy: WFL for tasks assessed/performed   PT - Cognitive impairments: Problem solving, Safety/Judgement,  Awareness, Memory                       PT - Cognition Comments: could not remember why he could not push up through his arms to sit up, despite maximal cuing can not remember contents of his go bac. Following commands: Impaired Following commands impaired: Follows multi-step commands inconsistently, Follows one step commands with increased time    Cueing Cueing Techniques: Verbal cues, Visual cues, Tactile cues     General Comments General comments (skin integrity, edema, etc.): pt does much better with transfer from wall to batteries and back needing min-mod cuing VAD settings 5400 flow 5.1lpm Power 3.9W PI2.9      Pertinent Vitals/Pain  VSS on RA      PT Goals (current goals can now be found in the care plan section) Progress towards PT goals: Progressing toward goals    Frequency    Min 3X/week       AM-PAC PT 6 Clicks Mobility   Outcome Measure  Help needed turning from your back to your side while in a flat bed without using bedrails?: A Little Help needed moving from lying on your back to sitting on the side of a flat bed without using bedrails?: A Little Help needed moving to and from a bed to a chair (including a wheelchair)?: A Lot Help needed standing up from a chair using your arms (e.g., wheelchair or bedside chair)?: A Lot Help needed to walk in hospital room?: A Lot Help needed climbing 3-5 steps with a railing? : A Lot 6 Click Score: 14    End of Session Equipment Utilized During Treatment: Gait belt Activity Tolerance: Patient tolerated treatment well Patient left: with call bell/phone within reach;in bed;with bed alarm set Nurse Communication: Mobility status PT Visit Diagnosis: Unsteadiness on feet (R26.81);Other abnormalities of gait and mobility (R26.89);Muscle weakness (generalized) (M62.81);Difficulty in walking, not elsewhere classified (R26.2);Pain     Time: 8496-8452 PT Time Calculation (min) (ACUTE ONLY): 44 min  Charges:     $Gait Training: 8-22 mins $Therapeutic Activity: 23-37 mins PT General Charges $$ ACUTE PT VISIT: 1 Visit                     Samai Corea B. Fleeta Lapidus PT, DPT Acute Rehabilitation Services Please use secure chat or  Call Office [phone]    Almarie KATHEE Fleeta [facility] 10/29/2024, 4:10 PM

## 2024-10-29 NOTE — Plan of Care (Signed)
" °  Problem: Activity: Goal: Ability to return to baseline activity level will improve Outcome: Progressing   Problem: Cardiovascular: Goal: Vascular access site(s) Level 0-1 will be maintained Outcome: Progressing   Problem: Health Behavior/Discharge Planning: Goal: Ability to safely manage health-related needs after discharge will improve Outcome: Progressing   Problem: Education: Goal: Knowledge of General Education information will improve Description: Including pain rating scale, medication(s)/side effects and non-pharmacologic comfort measures Outcome: Progressing   Problem: Clinical Measurements: Goal: Will remain free from infection Outcome: Progressing   Problem: Activity: Goal: Risk for activity intolerance will decrease Outcome: Progressing   Problem: Nutrition: Goal: Adequate nutrition will be maintained Outcome: Progressing   Problem: Coping: Goal: Level of anxiety will decrease Outcome: Progressing   Problem: Elimination: Goal: Will not experience complications related to bowel motility Outcome: Progressing Goal: Will not experience complications related to urinary retention Outcome: Progressing   Problem: Skin Integrity: Goal: Risk for impaired skin integrity will decrease Outcome: Progressing   "

## 2024-10-29 NOTE — Progress Notes (Signed)
 Nutrition Follow-up  DOCUMENTATION CODES:   Not applicable  INTERVENTION:  Continue liberalized diet to maximize intake   -Allow double protein on meal trays  -Family is and may bring in food from outside hospital to augment intake   Continue Boost Plus po TID (Chocolate), each supplement provides 360 kcal and 14 grams of protein  Discussed importance of consuming ONS in addition to adequate meal time intake as his needs are significantly elevated r/t age, muscle mass, and body habitus in post op setting  Continue potassium supplementation; add MVI w/ minerals   NUTRITION DIAGNOSIS:  Increased nutrient needs related to post-op healing, catabolic illness as evidenced by estimated needs. - remains applicable  GOAL:  Patient will meet greater than or equal to 90% of their needs - progressing  MONITOR:  PO intake, Supplement acceptance, Labs, Weight trends, I & O's  REASON FOR ASSESSMENT:   Consult Assessment of nutrition requirement/status  ASSESSMENT:   64 yo male admitted for pre-op optimization for LVAD implant. Pt with acute on chronic systolic CHF (NICM)-last Echo 0/74 with EF 10-15%, severe LV dilation, mild-mod RV dilation, moderate MR, moderate TR, ICD implantation. Additional PMH includes DM, COPD, HTN, tobacco abuse (quit 2022), inguinal hernia repair  12/18 Admitted 12/23 Impella 5.5 12/31 HM3 LVAD implanted, Impella 5.5 removed 01/01 Extubated 01/11 off milrinone  1/12 TEE: EF 20%; mod/sev HK   Off milrinone  and pressors. CIR following for potential admission.    Pt resting in bed at time of bedside visit this morning. RN reports 100% of his breakfast meal consumed and noted with 100% of his Ensure offering consumed. Encourage continued trend.    Average Meal Intake 1/10: 10-15% x2 documented meals 1/15: 100% x1 documented meal   Discussed need for increased calories and protein to prevent loss of lean body mass and to progress with therapy. He endorses  participation with therapy and trying to do as much as he can for himself to promote progress toward discharge.    Pt denies any nausea or vomiting, no abdominal discomfort. +BMs, 1 yesterday as well as 1 this morning. They are formed.    CBGs 98-178, hx of DM. Insulin  regimen being adjusted with re-initiation of oral diet and improving appetite/intake levels. Off Lantus . Lasix  has been decreased since last assessment. On daily potassium supplement.     Current Wt: 95.7 kg Weight up post op but trending back down. Noted to be back down around pre-op weight.  Admit Wt: 104.3 kg with highest wt of 111 kg on 1/03  He noted significant fluctuation in weight prior to admission related to changes in fluid status. No edema on exam.    Labs: Sodium 135 Potassium 4.1 BUN 21 Creatinine 1.20 Phosphorus 3.0 Magnesium  2.0 (wdl)  Meds: Lasix  daily SS novolog  with meals and bedtime Pantoprazole  daily Miralax  daily KCl daily Senokot-S daily Spironolactone    NUTRITION - FOCUSED PHYSICAL EXAM:  Flowsheet Row Most Recent Value  Orbital Region No depletion  Upper Arm Region Mild depletion  Thoracic and Lumbar Region No depletion  Buccal Region Mild depletion  Temple Region No depletion  Clavicle Bone Region Mild depletion  Clavicle and Acromion Bone Region No depletion  Scapular Bone Region Mild depletion  Dorsal Hand No depletion  Patellar Region No depletion  Anterior Thigh Region Mild depletion  Posterior Calf Region Mild depletion  Edema (RD Assessment) None  Hair Reviewed  Eyes Reviewed  Mouth Reviewed  Skin Reviewed  Nails Reviewed    Diet Order:  Diet Order             Diet regular Room service appropriate? Yes; Fluid consistency: Thin  Diet effective now             EDUCATION NEEDS:  Education needs have been addressed  Skin:  Skin Assessment: Skin Integrity Issues: Skin Integrity Issues:: Incisions Incisions: R chest (closed), new Driveline (HM3 implantation  on 12/31)  Last BM:  1/6 type 5-6 large  Height:  Ht Readings from Last 1 Encounters:  10/21/24 6' 6 (1.981 m)    Weight:  Wt Readings from Last 1 Encounters:  10/29/24 95.7 kg   Ideal Body Weight:  97.3 kg  BMI:  Body mass index is 24.38 kg/m.  Estimated Nutritional Needs:   Kcal:  2400-2600 kcals  Protein:  140-175 g  Fluid:  1.8 L  Blair Deaner MS, RD, LDN Registered Dietitian I Clinical Nutrition RD Inpatient Contact Info in Amion

## 2024-10-29 NOTE — Progress Notes (Addendum)
 LVAD Coordinator Rounding Note:  Admitted 10/01/24 for optimization prior to LVAD implant. Impella 5.5 placed 10/06/24.  HM 3 LVAD implanted on 10/14/24 by Dr Lucas under destination therapy criteria.  12/18: Milrinone  titrated from 0.25 to 0.5 mcg/kg/min post RHC d/t low-output and pulmonary hypertension 12/19: NE added 12/22: Markedly elevated PA pressures, elevated PCWP and elevated SVR. Titrated off NE. Added DBA.  12/23: Impella 5.5 Implanted.  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 Heparin  > Bival.  12/31: HM III LVAD implant 1/5: LVAD speed increased to 5400  1/6: LUE weakness, head CT negative  1/9: Ramp echo, speed increased to 5500 rpm 1/11: Off milrinone . 1/12: bedside TEE/DCCV. EF 20%, mod/sev HK. VAD speed decreased to 5400  Pt lying in bed this morning. States he slept well. Denies pain. He is oriented to self, thinks he is at the heart failure clinic, oriented to year, and time of day. Much of what he is saying still does not make sense. Pt has difficulty managing his equipment. He needs constant reinforcement. Pt asks the same questions about his driveline and controller multiple times.   Discussed pt mental status with provider team. Ammonia level checked- WNL. Head CT- negative. Sleep aids discontinued yesterday.   Discharge education completed 10/28/24. Ongoing driveline dressing change teaching with pt's wife. Plan to perform dressing change with VAD coordinator tomorrow morning.   Vital signs: Temp: 98.1 HR: 70 Doppler Pressure: 90 Automatic BP: 90/75 (83) O2 Sat: 94% on RA Wt: 235.4>244.3>243.4>234.8>223.5>239.6>220.2>224.2>214>210.9 lb    LVAD interrogation reveals:  Speed: 5400 Flow: 5.2 Power: 3.8 w PI: 3.0   Alarms: none Events: none Hematocrit: 27  Fixed speed: 5400 Low speed limit: 5100   Drive Line: Existing VAD dressing clean, dry, and intact. Cath grip anchor secure. Continue MWF dressing changes by VAD coordinator or nurse champion. Next dressing change  due 10/30/24 by VAD coordinator or nurse champion.  Labs:  LDH trend: 703>615>541>598>724>405>366>294>243>295>594>256  INR trend: 1.3>1.4>1.4>1.6>1.7>1.7>2.0>2.1>2.2>2.3>2.2  Anticoagulation Plan: - INR Goal: 2.0 - 2.5 - Coumadin  dosing per pharmacy  Blood Products:  Intra-op 10/14/24:   4 FFP  2 platelets  700 cellsaver  DDAVP  x 2 Post-op:  10/15/24: 1 PRBC  Device: - Biotronik -Therapies: ON 10/29/24  Arrythmias: cardioverted from afib on 10/26/24  Respiratory: extubated 10/15/24  Infection:   Drips:  Milrinone  0.125 mcg/kg/min-off Amiodarone  30 mg/hr-off  Adverse Events on VAD:  Patient Education:  Completed 10/28/24. See separate note for details Ongoing dressing change teaching with pt's wife [name]  Plan/Recommendations:  1. Page VAD coordinator for any equipment or drive line concerns 2. Continue Monday, Wednesday, Friday drive line dressing changes by VAD coordinator or nurse champion  Isaiah Knoll RN VAD Coordinator  Office: [phone]  24/7 Pager: [phone]

## 2024-10-29 NOTE — Discharge Summary (Addendum)
 Advanced Heart Failure Team  Discharge Summary   Patient ID: James [name] MRN: [id number], DOB/AGE: October 21, 1960 64 y.o. Admit date: 10/01/2024 D/C date:     11/02/2024   Primary Discharge Diagnoses:  Stage D, NYHA IV, Chronic Systolic Heart Failure, Inotrope Dependent, S/p HMIII LVAD Implant   Secondary Discharge Diagnoses:  Pulmonary HTN  AKI on CKD Stage IIIb Persistent Atrial Fibrillation s/p TEE/DCCV  PVCS/ NSVT  Hospital Course:   James [name] is a 64 y.o. male with HFrEF/nonischemic cardiomyopathy, VT s/p ICD, HTN, T2DM, aortic root aneurysm, prostate cancer s/p EBRT and ADT, meningioma. HF dates back to 2011 when he was diagnosed with nonischemic cardiomyopathy.     CPX 3/24  suggestive of severe heart failure limitation and multiple right heart catheterizations demonstrated persistently reduced cardiac index less than 2 L/min/m.     Completed radiation for prostate cancer April 2025. He was also referred to Neurology for mass noted on CT of head 5/25. F/u scan 9/25 showed stable size.    Echo 7/25 showed EF  25-30%, G3DD, RV mildly reduced, moderate MR  Admitted 07/09/24 with cardiogenic shock. Previous admission within the last year for similar decompensation and was worked up for transplant/LVAD in 2024. Presented with lactic acidosis, AKI, and shock liver. Echo showed EF 10-15% with mod reduced RV. He was started on Milrinone  and aggressively diuresed with improvement of AKI and liver function. Failed attempt to wean from milrinone . Course further complicated by high PVC and NSVT burden, which was managed w/ mexiletine and amiodarone . Discharged home on Milrinone  support, 0.25 mcg/kg/min.    Initial plan was to follow-up with [facility] for transplant. After discussion with family, patient decided he would prefer to consider LVAD. His wife had a relative who suffered a poor outcome following transplant. Also concerned about potential waiting period for transplant. He was seen  in VAD clinic, met with TCTS surgeon, Dr. Daniel, and discussed at [facility]. He was felt to be a reasonable candidate for LVAD.   He was admitted on 10/01/24 for pre-VAD optimization prior to planned surgery. RHC showed elevated filling pressures with mod-severe mixed pulmonary arterial/pulmonary venous hypertension and low CO despite milrinone  0.25. Dose was increased to 0.375 mcg/kg/min. Swan left in place to guide therapies. Diuresed w/ IV Lasix . Hemodynamics remained marginal, ultimately requiring escalation to dual inotropes w/ addition of DBA and NE and later taken to OR on 12/23 for placement of an Impella 5.5 prior to durable LVAD. Hemodynamics improved w/ Impella 5.5. Felt to be optimized and returned back to OR on 12/31 for HMIII LVAD implant and removal of Impella 5.5. Required Milrinone  and Epi post op for RV support. Diuresed and able to wean off inotropes/pressors. Post op course also c/b afib, requiring TEE/DCCV. Converted back to NSR and maintained on amiodarone . Also w/ development of ICU delirium which ultimately improved after discontinuation of trazodone . He progressed and worked w/ PT. He was discharged to CIR once medically ready.   See below for hospital course by problem.    Hospital Course by Problem:  Acute on chronic systolic CHF, NYHA IV: Nonischemic cardiomyopathy. RHC 12/18 showed elevated filling pressures with mod-severe mixed pulmonary arterial/pulmonary venous hypertension and low CO despite milrinone  0.25, increased to 0.5. Impella 5.5 placed 12/23 for further optimization. - S/p HM III LVAD implant by Dr. Lucas 10/14/24. - iNO off 1/1; Milrinone  off 1/11. Co-ox stable.  - Ramp echo 1/9, increased speed to 5500 rpm. TEE 1/12 decreased speed to 5400. - Volume looks  okay. Continue 40 mg lasix  daily. - Continue sildenafil  20 mg TID for RV support - Continue digoxin  0.0625 mg daily. Dig level 0.9 1/13. Repeat level in am. - Continue spironolactone  50 mg bid - MAPs 70s-80s -  INR 1.7. Discussed warfarin dosing with PharmD.   Pulmonary hypertension: Severe combined pre/post capillary PH on RHC - resolved post VAD   AKI on CKD stage 3: Resolved, Scr stable - follow BMP    PVCs/NSVT: Improved. Has Biotronik ICD. Tachy therapies turned on  - Continue mexiletine to 150 mg bid.    Persistent Atrial fibrillation:  - S/P DCCV 1/12. SR today. - Continue amio 200 mg bid - on warfarin   Meningioma: Monitoring.    Prostate cancer: Treated with radiation.    Thrombocytopenia: suspect consumptive with impella.  - HIT and SRA negative - resolved   ABLA:  - stable hgb   Hypokalemia/ Hypomagnesemia  - supp PRN    Deconditioning - continue PT/OT - Appreciate CIR   Constipation - SMOG enema today    VAD interrogation HeartMate 3 VAD Equipment Check Pump Speed (RPM): 5400 RPM Pump Flow (LPM): 5.2 Power (Watts): 4 Watts Pulsatility Index: 2.9 Fixed Speed Limit: 5400 rpm Low Speed Limit: 5100 rpm Alarms: No alarms Auscultated: Normal expected humming Power Module Self-Test (Daily): Done System Controller Self-Test: Passed Patient Battery Source: [facility] / Wall unit Emergency Equipment at Bedside: Yes   Discharge Vitals: Blood pressure (!) 87/70, pulse 67, temperature 97.8 F (36.6 C), temperature source Oral, resp. rate 19, height 6' 6 (1.981 m), weight 91.1 kg, SpO2 97%.  Physical Exam: GENERAL: no acute distress. CARDIAC:  No JVD. Mechanical heart sounds with LVAD hum present.  LUNGS:  Clear to auscultation bilaterally.  ABDOMEN:  Soft, round, nontender, positive bowel sounds x4.     LVAD exit site:   Dressing dry and intact.  Stabilization device present and accurately applied.   EXTREMITIES:  No edema  NEUROLOGIC:  Alert and oriented x 4.  Affect pleasant.      Labs: Lab Results  Component Value Date   WBC 8.6 11/02/2024   HGB 10.7 (L) 11/02/2024   HCT 33.0 (L) 11/02/2024   MCV 96.2 11/02/2024   PLT 202 11/02/2024    Recent Labs   Lab 11/02/24 0554  NA 131*  K 4.2  CL 98  CO2 26  BUN 22  CREATININE 1.08  CALCIUM  8.5*  GLUCOSE 110*   Lab Results  Component Value Date   CHOL 134 07/10/2024   HDL 33 (L) 07/10/2024   LDLCALC 82 07/10/2024   TRIG 80 10/18/2024   BNP (last 3 results) Recent Labs    06/10/24 1213 07/09/24 1649 07/23/24 0959  BNP 1,071.5* 3,316.0* 595.8*    ProBNP (last 3 results) Recent Labs    10/15/24 0400 10/21/24 0433 10/28/24 0559  PROBNP 9,508.0* 5,277.0* 8,170.0*     Diagnostic Studies/Procedures   RHC 12/25: Findings: On Impella 5.5   RA = 5 RV = 40/6 PA =  42/20 (27) PCW = 15 Fick cardiac output/index = 6.4/2.8 PVR = 1.9 WU Ao sat = 92% PA sat = 68%, 69% PAPi = 4.4   Assessment: 1. Much improved hemodynamics with Impella support   Plan/Discussion:  Plan durable VAD this admit.  Echo 1/26: IMPRESSIONS   1. LV cannula seen in apex. Left ventricular ejection fraction, by estimation, is <20%. The left ventricle has severely decreased function. The left ventricular internal cavity size was moderately dilated. There is  severe concentric left ventricular hypertrophy.   2. Right ventricular systolic function is moderately reduced. The right ventricular size is moderately enlarged.   3. Left atrial size was moderately dilated. No left atrial/left atrial appendage thrombus was detected.   4. Right atrial size was severely dilated.   5. The mitral valve is normal in structure. No evidence of mitral valve regurgitation.   6. Aortic valve does not open significantly in setting of LVAD support. Heavy smoke in aortic root. The aortic valve is tricuspid. Aortic valve regurgitation is trivial.    Discharge Medications   Allergies as of 11/02/2024   No Known Allergies      Medication List     STOP taking these medications    acetaminophen  500 MG tablet Commonly known as: TYLENOL    albuterol  108 (90 Base) MCG/ACT inhaler Commonly known as: VENTOLIN  HFA    apixaban  5 MG Tabs tablet Commonly known as: Eliquis    Farxiga  10 MG Tabs tablet Generic drug: dapagliflozin  propanediol   hydrALAZINE  50 MG tablet Commonly known as: APRESOLINE    metFORMIN 500 MG tablet Commonly known as: GLUCOPHAGE   milrinone  20 MG/100 ML Soln infusion Commonly known as: PRIMACOR    ondansetron  4 MG tablet Commonly known as: ZOFRAN  Replaced by: ondansetron  4 MG/2ML Soln injection   potassium chloride  SA 20 MEQ tablet Commonly known as: KLOR-CON  M   sacubitril -valsartan  24-26 MG Commonly known as: Entresto    torsemide  20 MG tablet Commonly known as: DEMADEX        TAKE these medications    amiodarone  200 MG tablet Commonly known as: PACERONE  Take 1 tablet (200 mg total) by mouth 2 (two) times daily. What changed: when to take this   atorvastatin  40 MG tablet Commonly known as: LIPITOR Take 1 tablet (40 mg total) by mouth daily.   Chlorhexidine  Gluconate Cloth 2 % Pads Apply 6 each topically daily. Start taking on: November 03, 2024   collagenase  250 UNIT/GM ointment Commonly known as: SANTYL  Apply topically daily. Start taking on: November 03, 2024   Digoxin  62.5 MCG Tabs Take 0.0625 mg by mouth daily. Start taking on: November 03, 2024 What changed:  medication strength how much to take   furosemide  40 MG tablet Commonly known as: LASIX  Take 1 tablet (40 mg total) by mouth daily. Start taking on: November 03, 2024   gabapentin  100 MG capsule Commonly known as: NEURONTIN  Take 2 capsules (200 mg total) by mouth at bedtime.   guaiFENesin -dextromethorphan  100-10 MG/5ML syrup Commonly known as: ROBITUSSIN DM Take 5 mLs by mouth every 4 (four) hours as needed for cough.   lactose free nutrition Liqd Take 237 mLs by mouth daily. Start taking on: November 03, 2024   mexiletine 150 MG capsule Commonly known as: MEXITIL  Take 1 capsule (150 mg total) by mouth every 12 (twelve) hours. What changed:  medication strength how much to  take when to take this   mouth rinse Liqd solution 15 mLs by Mouth Rinse route as needed (for oral care).   multivitamin with minerals Tabs tablet Take 1 tablet by mouth daily. Start taking on: November 03, 2024   ondansetron  4 MG/2ML Soln injection Commonly known as: ZOFRAN  Inject 2 mLs (4 mg total) into the vein every 6 (six) hours as needed for nausea or vomiting. Replaces: ondansetron  4 MG tablet   oxyCODONE  5 MG immediate release tablet Commonly known as: Oxy IR/ROXICODONE  Take 1 tablet (5 mg total) by mouth every 3 (three) hours as needed for severe pain (pain score  7-10).   pantoprazole  40 MG tablet Commonly known as: PROTONIX  Take 1 tablet (40 mg total) by mouth daily. Start taking on: November 03, 2024   phenol 1.4 % Liqd Commonly known as: CHLORASEPTIC Use as directed 1 spray in the mouth or throat as needed for throat irritation / pain.   polyethylene glycol 17 g packet Commonly known as: MIRALAX  / GLYCOLAX  Take 17 g by mouth daily as needed for mild constipation.   polyethylene glycol 17 g packet Commonly known as: MIRALAX  / GLYCOLAX  Take 17 g by mouth daily.   potassium chloride  20 MEQ packet Commonly known as: KLOR-CON  Take 40 mEq by mouth daily.   senna 8.6 MG Tabs tablet Commonly known as: SENOKOT Take 2 tablets (17.2 mg total) by mouth at bedtime as needed for moderate constipation.   senna-docusate 8.6-50 MG tablet Commonly known as: Senokot-S Take 2 tablets by mouth at bedtime.   sildenafil  20 MG tablet Commonly known as: REVATIO  Take 1 tablet (20 mg total) by mouth 3 (three) times daily.   sodium chloride  flush 0.9 % Soln Commonly known as: NS 10-40 mLs by Intracatheter route every 12 (twelve) hours.   spironolactone  50 MG tablet Commonly known as: ALDACTONE  Take 1 tablet (50 mg total) by mouth 2 (two) times daily. What changed:  medication strength how much to take when to take this   warfarin 4 MG tablet Commonly known as:  COUMADIN  Take 1 tablet (4 mg total) by mouth daily at 4 PM. Start taking on: November 03, 2024        Disposition   The patient will be discharged in stable condition to home. Discharge Instructions     Amb Referral to Cardiac Rehabilitation   Complete by: As directed    Diagnosis: Other   After initial evaluation and assessments completed: Virtual Based Care may be provided alone or in conjunction with Phase 2 Cardiac Rehab based on patient barriers.: Yes   Intensive Cardiac Rehabilitation (ICR) [facility] location only OR Traditional Cardiac Rehabilitation (TCR) *If criteria for ICR are not met will enroll in TCR [facility] only): Yes          Duration of Discharge Encounter: 37 minutes  Signed, Tavon Magnussen N  11/02/2024, 3:43 PM

## 2024-10-29 NOTE — Progress Notes (Signed)
 PHARMACY - ANTICOAGULATION CONSULT NOTE  Pharmacy Consult for warfarin Indication: LVAD HM3 + hx AFib  Allergies[1]  Patient Measurements: Height: 6' 6 (198.1 cm) Weight: 95.7 kg (210 lb 15.7 oz) IBW/kg (Calculated) : 91.4 HEPARIN  DW (KG): 108.7  Vital Signs: Temp: 98.5 F (36.9 C) (01/15 0346) Temp Source: Oral (01/15 0346) BP: 97/63 (01/15 0346) Pulse Rate: 67 (01/15 0000)  Labs: Recent Labs    10/27/24 0511 10/27/24 0516 10/28/24 0559 10/28/24 1521 10/29/24 0425  HGB 9.9* 8.6*  --   --   --   HCT 29.0* 26.8*  --   --   --   PLT  --  173  --   --   --   LABPROT  --  26.0* 26.6*  --  25.5*  INR  --  2.2* 2.3*  --  2.2*  CREATININE  --  1.12 1.21 1.41* 1.20    Estimated Creatinine Clearance: 81.5 mL/min (by C-G formula based on SCr of 1.2 mg/dL).   Medical History: Past Medical History:  Diagnosis Date   CKD (chronic kidney disease) stage 2, GFR 60-89 ml/min    COPD (chronic obstructive pulmonary disease) (HCC)    Diabetes mellitus type II, non insulin  dependent (HCC)    Dilated aortic root    Elevated PSA    Hypercholesteremia    Hypertension    NICM (nonischemic cardiomyopathy) (HCC)    NSVT (nonsustained ventricular tachycardia) (HCC)    Obstructive sleep apnea    Paroxysmal atrial fibrillation (HCC)    Pulmonary hypertension (HCC)    PVC's (premature ventricular contractions)    Tobacco abuse     Medications:  Scheduled:   amiodarone   200 mg Oral BID   Chlorhexidine  Gluconate Cloth  6 each Topical Daily   collagenase    Topical Daily   digoxin   0.0625 mg Oral Daily   furosemide   40 mg Oral Daily   gabapentin   200 mg Oral QHS   insulin  aspart  0-15 Units Subcutaneous TID WC   insulin  aspart  0-5 Units Subcutaneous QHS   insulin  aspart  2 Units Subcutaneous TID WC   lactose free nutrition  237 mL Oral TID BM   mexiletine  150 mg Oral Q12H   pantoprazole   40 mg Oral Daily   polyethylene glycol  17 g Oral Daily   potassium chloride   40 mEq Oral  Daily   senna-docusate  1 tablet Oral Daily   sildenafil   20 mg Oral TID   sodium chloride  flush  10-40 mL Intracatheter Q12H   spironolactone   50 mg Oral BID   Warfarin - Pharmacist Dosing Inpatient   Does not apply q1600    Assessment: 63 yom who underwent LVAD HM3 placement on 12/31. Was on apixaban  PTA for hx Afib > now with LVAD plan to change to warfarin.   INR is therapeutic at 2.2. Hgb 8.6, plt 173, LDH 295 from 1/13. Diet slowly improving, eating partial meals. Remains on amio PO.   Goal of Therapy:  INR 2-2.5 Monitor platelets by anticoagulation protocol: Yes   Plan:  Warfarin 2 mg x1 orally tonight   Monitor daily INR, CBC, and for s/sx of bleeding  F/u PO intake  Thank you for allowing pharmacy to participate in this patient's care,  Vermell Mccallum, PharmD 10/29/2024 7:30 AM  Please check AMION for all [facility] Pharmacy phone numbers After 10:00 PM, call Main Pharmacy [phone]         [1] No Known Allergies

## 2024-10-29 NOTE — Progress Notes (Addendum)
 Patient ID: James [name], male   DOB: April 27, 1961, 64 y.o.   MRN: [id number]     Advanced Heart Failure Rounding Note   [facility] Cardiologist: Dr. Zenaida Chief Complaint: Post-op LVAD Patient Profile   James [name] is a 64 y.o. male with history of stage D cardiomyopathy/chronic systolic heart failure now end-stage w/ inotrope dependence, CKD IIIa, hx VT, PAF, prostate cancer, meningioma.   Admitted for LVAD implant/ pre-VAD HF optimization.   Significant events:   12/18: Milrinone  titrated from 0.25 to 0.5 mcg/kg/min post RHC d/t low-output and pulmonary hypertension 12/19: NE added 12/22: Markedly elevated PA pressures, elevated PCWP and elevated SVR. Titrated off NE. Added DBA.  12/23: Impella 5.5 Implanted.  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 Heparin  > Bival.  12/31: HM III LVAD implant 1/5: LVAD speed increased to 5400  1/6: LUE weakness, head CT negative  1/9: Ramp echo, speed increased to 5500 rpm 1/11: Off milrinone . 1/12: bedside TEE/DCCV. EF 20%, mod/sev HK. VAD speed decreased to 5400 1/14: transferred out of CCU   Subjective:    S/P HM3 LVAD 10/14/24.  Co-ox 65% CVP  Scr 1.41>>1.20, K 4.1   LDH 594>>256 INR 2.2   Feels great today. Delirum resolved. In good mood. Ambulating well. Had BM this morning.   LVAD INTERROGATION:  HeartMate IIl LVAD:  Flow 4.9 liters/min, speed 5400  power 4.0, PI 2.8  No PI events   Objective:    Weight Range: 95.7 kg Body mass index is 24.38 kg/m.   Vital Signs:   Temp:  [98.1 F (36.7 C)-98.6 F (37 C)] 98.2 F (36.8 C) (01/15 0836) Pulse Rate:  [67-80] 72 (01/15 0836) Resp:  [16-20] 18 (01/15 0836) BP: (87-139)/(63-82) 90/75 (01/15 0836) SpO2:  [91 %-100 %] 100 % (01/15 0836) Weight:  [95.7 kg] 95.7 kg (01/15 0524) Last BM Date : 10/28/24  Doppler MAP: 86  Weight change: Filed Weights   10/24/24 0613 10/26/24 0500 10/29/24 0524  Weight: 100.6 kg 97.1 kg 95.7 kg   Intake/Output:  Intake/Output Summary (Last 24  hours) at 10/29/2024 1132 Last data filed at 10/29/2024 0844 Gross per 24 hour  Intake 10 ml  Output 2265 ml  Net -2255 ml    Physical Exam   General: well appearing. NAD  Cardiac: JVP not elevated. Mechanical heart sounds, + LVAD hum  Driveline: Dressing C/D/I. No drainage or redness. Anchor in place  Extremities: Warm and dry. No LEE  Neuro: A&Ox 3. Affect pleasant   Telemetry   NSR 80s (personally reviewed)  Labs   CBC Recent Labs    10/27/24 0511 10/27/24 0516  WBC  --  9.9  NEUTROABS  --  8.4*  HGB 9.9* 8.6*  HCT 29.0* 26.8*  MCV  --  96.4  PLT  --  173   Basic Metabolic Panel Recent Labs    98/86/73 0516 10/28/24 0559 10/28/24 1521 10/29/24 0425  NA 134* 137 136 135  K 3.4* 4.4 4.4 4.1  CL 96* 101 99 99  CO2 29 28 29 28   GLUCOSE 157* 84 146* 93  BUN 25* 22 26* 21  CREATININE 1.12 1.21 1.41* 1.20  CALCIUM  7.9* 8.6* 8.3* 8.4*  MG 1.8 2.2  --   --   PHOS 2.7 2.7  --  3.0   Liver Function Tests Recent Labs    10/28/24 0559 10/29/24 0425  ALBUMIN  3.1* 2.9*   BNP (last 3 results) Recent Labs    06/10/24 1213 07/09/24 1649 07/23/24 9040  BNP 1,071.5* 3,316.0* 595.8*   ProBNP (last 3 results) Recent Labs    10/15/24 0400 10/21/24 0433 10/28/24 0559  PROBNP 9,508.0* 5,277.0* 8,170.0*   Medications:    Scheduled Medications:  amiodarone   200 mg Oral BID   Chlorhexidine  Gluconate Cloth  6 each Topical Daily   collagenase    Topical Daily   digoxin   0.0625 mg Oral Daily   furosemide   40 mg Oral Daily   gabapentin   200 mg Oral QHS   insulin  aspart  0-15 Units Subcutaneous TID WC   insulin  aspart  0-5 Units Subcutaneous QHS   insulin  aspart  2 Units Subcutaneous TID WC   lactose free nutrition  237 mL Oral TID BM   mexiletine  150 mg Oral Q12H   pantoprazole   40 mg Oral Daily   polyethylene glycol  17 g Oral Daily   potassium chloride   40 mEq Oral Daily   senna-docusate  1 tablet Oral Daily   sildenafil   20 mg Oral TID   sodium  chloride flush  10-40 mL Intracatheter Q12H   spironolactone   50 mg Oral BID   Warfarin - Pharmacist Dosing Inpatient   Does not apply q1600    Infusions:    PRN Medications: guaiFENesin -dextromethorphan , ondansetron  (ZOFRAN ) IV, mouth rinse, oxyCODONE , phenol, polyethylene glycol, senna  Assessment/Plan   Acute on chronic systolic CHF, NYHA IV: Nonischemic cardiomyopathy. RHC 12/18 showed elevated filling pressures with mod-severe mixed pulmonary arterial/pulmonary venous hypertension and low CO despite milrinone  0.25, increased to 0.5. Impella 5.5 placed 12/23 for further optimization. - S/p HM III LVAD implant by Dr. Lucas 10/14/24. - iNO off 1/1; Milrinone  off 1/11. Co-ox stable.  - Ramp echo 1/9, increased speed to 5500 rpm. TEE 1/12 decreased speed to 5400. - Volume ok. Continue Lasix  40 mg daily.  - Continue sildenafil  20 mg TID for RV support - Continue digoxin  0.0625 mg daily. Dig level 0.9 1/13 - Continue spironolactone  50 mg bid - MAPs 80s   Pulmonary hypertension: Severe combined pre/post capillary PH on RHC, suspect largely due to venous remodeling. Improved with adequate unloading.  AKI on CKD stage 3: Creatinine stable 1.2 - follow BMP   PVCs/NSVT: Improved. Has Biotronik ICD. - Continue mexiletine to 150 mg bid.  - Will turn tachytherapies back on, device rep notified   Persistent Atrial fibrillation:  - S/P DCCV 1/12. Maintaining NSR  - Continue amio 200 mg bid - On warfarin  Meningioma: Monitoring.   Prostate cancer: Treated with radiation.   Thrombocytopenia: suspect consumptive with impella.  - HIT and SRA negative - resolved  ABLA:  - stable hgb  Hypokalemia/ Hypomagnesemia  - repleated, K 4.1 today    - spiro 50 mg bid + KCL 40 mEq daily  - aldosteron/renin ratio pending  Deconditioning - continue PT/OT - CIR team following   Constipation - resolved   Continue to ambulate. Plan CIR soon.    Caffie Shed, PA-C 10/29/24, 11:32  AM  Advanced Heart Failure Team Pager [phone] (M-F; 7a - 5p)    Please visit [email or web address]: For overnight coverage please call cardiology fellow first. If fellow not available call Shock/ECMO MD on call.  For ECMO / Mechanical Support (Impella, IABP, LVAD) issues call Shock / ECMO MD on call.   Patient seen and examined with the above-signed Advanced Practice Provider and/or Housestaff. I personally reviewed laboratory data, imaging studies and relevant notes. I independently examined the patient and formulated the important aspects of the plan. I have edited the note  to reflect any of my changes or salient points. I have personally discussed the plan with the patient and/or family.  He feels good today.  Less confused.  We stopped his trazodone  yesterday.  Denies chest pain or shortness of breath.  Remains in normal sinus rhythm.  INR 2.2.  General:  NAD.  HEENT: normal  Neck: supple. JVP not elevated.  Carotids 2+ bilat; no bruits. No lymphadenopathy or thryomegaly appreciated. Cor: LVAD hum.  Lungs: Clear. Abdomen: soft, nontender, non-distended. No hepatosplenomegaly. No bruits or masses. Good bowel sounds. Driveline site clean. Anchor in place.  Extremities: no cyanosis, clubbing, rash. Warm no edema  Neuro: alert & oriented x 3. No focal deficits. Moves all 4 without problem   He has less confusion today.  However given overall concern for his mental status we will continue with plan for noncontrast brain CT.  Volume status and maps are stable.  VAD interrogated personally. Parameters stable.  INR 2.2. Discussed warfarin dosing with PharmD personally.  Await results of CT scan.  Otherwise I think he should be stable for transfer to CIR later today if a bed is available.  Toribio Fuel, MD  2:28 PM

## 2024-10-29 NOTE — TOC Progression Note (Signed)
 Transition of Care [facility]) - Progression Note    Patient Details  Name: James [name] MRN: [id number] Date of Birth: 30-Sep-1961  Transition of Care [facility]) CM/SW Contact  [name] Phone Number: [phone] 10/29/2024, 9:07 AM  Clinical Narrative:    HF CSW reviewed patients chart for discharge readiness, patient not medically stable for d/c. CIR continues to follow. ICM will continue to follow.   HF CSW/CM will continue to follow and monitor for dc readiness.     Expected Discharge Plan: IP Rehab Facility Barriers to Discharge: Continued Medical Work up               Expected Discharge Plan and Services   Discharge Planning Services: CM Consult Post Acute Care Choice: Home Health Living arrangements for the past 2 months: Single Family Home Expected Discharge Date: 10/25/24                                     Social Drivers of Health (SDOH) Interventions SDOH Screenings   Food Insecurity: No Food Insecurity (10/06/2024)  Housing: Low Risk (10/06/2024)  Transportation Needs: No Transportation Needs (10/06/2024)  Utilities: Not At Risk (10/06/2024)  Alcohol Screen: Low Risk (11/18/2023)  Depression (PHQ2-9): Low Risk (08/21/2024)  Tobacco Use: Medium Risk (10/28/2024)    Readmission Risk Interventions    07/10/2024   11:05 AM 10/10/2023   10:37 AM  Readmission Risk Prevention Plan  Transportation Screening Complete Complete  Home Care Screening Complete Complete  Medication Review (RN CM) Complete Complete

## 2024-10-29 NOTE — Progress Notes (Signed)
 Inpatient Rehab Admissions Coordinator:    CIR following for medical readiness. Reached out to HF team to see when they think he's likely to be ready.   Leita Kleine, MS, CCC-SLP Rehab Admissions Coordinator  [phone] (celll) [phone] (office)

## 2024-10-30 DIAGNOSIS — Z95811 Presence of heart assist device: Secondary | ICD-10-CM | POA: Diagnosis not present

## 2024-10-30 DIAGNOSIS — R5381 Other malaise: Secondary | ICD-10-CM

## 2024-10-30 DIAGNOSIS — R570 Cardiogenic shock: Secondary | ICD-10-CM | POA: Diagnosis not present

## 2024-10-30 LAB — PROTIME-INR
INR: 1.8 — ABNORMAL HIGH (ref 0.8–1.2)
Prothrombin Time: 21.9 s — ABNORMAL HIGH (ref 11.4–15.2)

## 2024-10-30 LAB — CBC
HCT: 28.8 % — ABNORMAL LOW (ref 39.0–52.0)
Hemoglobin: 9.4 g/dL — ABNORMAL LOW (ref 13.0–17.0)
MCH: 31.2 pg (ref 26.0–34.0)
MCHC: 32.6 g/dL (ref 30.0–36.0)
MCV: 95.7 fL (ref 80.0–100.0)
Platelets: 216 K/uL (ref 150–400)
RBC: 3.01 MIL/uL — ABNORMAL LOW (ref 4.22–5.81)
RDW: 18.9 % — ABNORMAL HIGH (ref 11.5–15.5)
WBC: 8.2 K/uL (ref 4.0–10.5)
nRBC: 0 % (ref 0.0–0.2)

## 2024-10-30 LAB — RENAL FUNCTION PANEL
Albumin: 2.9 g/dL — ABNORMAL LOW (ref 3.5–5.0)
Anion gap: 9 (ref 5–15)
BUN: 21 mg/dL (ref 8–23)
CO2: 26 mmol/L (ref 22–32)
Calcium: 8.6 mg/dL — ABNORMAL LOW (ref 8.9–10.3)
Chloride: 100 mmol/L (ref 98–111)
Creatinine, Ser: 1.11 mg/dL (ref 0.61–1.24)
GFR, Estimated: >60 mL/min
Glucose, Bld: 90 mg/dL (ref 70–99)
Phosphorus: 2.8 mg/dL (ref 2.5–4.6)
Potassium: 4.2 mmol/L (ref 3.5–5.1)
Sodium: 134 mmol/L — ABNORMAL LOW (ref 135–145)

## 2024-10-30 LAB — GLUCOSE, CAPILLARY
Glucose-Capillary: 102 mg/dL — ABNORMAL HIGH (ref 70–99)
Glucose-Capillary: 128 mg/dL — ABNORMAL HIGH (ref 70–99)
Glucose-Capillary: 139 mg/dL — ABNORMAL HIGH (ref 70–99)
Glucose-Capillary: 88 mg/dL (ref 70–99)

## 2024-10-30 LAB — COOXEMETRY PANEL
Carboxyhemoglobin: 2.8 % — ABNORMAL HIGH (ref 0.5–1.5)
Methemoglobin: 0.7 % (ref 0.0–1.5)
O2 Saturation: 76.8 %
Total hemoglobin: 9.7 g/dL — ABNORMAL LOW (ref 12.0–16.0)

## 2024-10-30 LAB — LACTATE DEHYDROGENASE: LDH: 325 U/L — ABNORMAL HIGH (ref 105–235)

## 2024-10-30 MED ORDER — WARFARIN SODIUM 2 MG PO TABS
4.0000 mg | ORAL_TABLET | Freq: Once | ORAL | Status: AC
  Administered 2024-10-30: 4 mg via ORAL
  Filled 2024-10-30: qty 2

## 2024-10-30 NOTE — Progress Notes (Addendum)
 Patient ID: James [name], male   DOB: 03/28/1961, 64 y.o.   MRN: [id number]     Advanced Heart Failure Rounding Note   AHF Cardiologist: Dr. Zenaida Chief Complaint: Post-op LVAD Patient Profile   James [name] is a 64 y.o. male with history of stage D cardiomyopathy/chronic systolic heart failure now end-stage w/ inotrope dependence, CKD IIIa, hx VT, PAF, prostate cancer, meningioma.   Admitted for LVAD implant/ pre-VAD HF optimization.   Significant events:   12/18: Milrinone  titrated from 0.25 to 0.5 mcg/kg/min post RHC d/t low-output and pulmonary hypertension 12/19: NE added 12/22: Markedly elevated PA pressures, elevated PCWP and elevated SVR. Titrated off NE. Added DBA.  12/23: Impella 5.5 Implanted.  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 Heparin  > Bival.  12/31: HM III LVAD implant 1/5: LVAD speed increased to 5400  1/6: LUE weakness, head CT negative  1/9: Ramp echo, speed increased to 5500 rpm 1/11: Off milrinone . 1/12: bedside TEE/DCCV. EF 20%, mod/sev HK. VAD speed decreased to 5400 1/14: transferred out of CCU  1/15: repeat head CT negative for acute changes   Subjective:    S/P HM3 LVAD 10/14/24.  Co-ox 77%  Scr 1.1   LDH 594>>256>>325 INR 1.8  Doing well. Ambulating w/o much difficulty. No dyspnea.    LVAD INTERROGATION:  HeartMate IIl LVAD:  Flow 4.9 liters/min, speed 5400  power 4.0, PI 2.8  No PI events   Objective:    Weight Range: 97.1 kg Body mass index is 24.74 kg/m.   Vital Signs:   Temp:  [97.9 F (36.6 C)-98.7 F (37.1 C)] 98 F (36.7 C) (01/16 1144) Pulse Rate:  [68-96] 96 (01/16 1144) Resp:  [15-19] 19 (01/16 1144) BP: (94-108)/(75-94) 108/94 (01/16 1144) SpO2:  [94 %-99 %] 95 % (01/16 1144) Weight:  [97.1 kg] 97.1 kg (01/16 0500) Last BM Date : 10/29/23  Doppler MAP: 80s-90s   Weight change: Filed Weights   10/26/24 0500 10/29/24 0524 10/30/24 0500  Weight: 97.1 kg 95.7 kg 97.1 kg   Intake/Output:  Intake/Output Summary  (Last 24 hours) at 10/30/2024 1208 Last data filed at 10/30/2024 0737 Gross per 24 hour  Intake 360 ml  Output 1500 ml  Net -1140 ml    Physical Exam   General: well appearing, NAD  Cardiac: Mechanical LVAD hum  Driveline: Dressing C/D/l. No drainage, anchor in place  Extremities: no LEE, warm and dry  Neuro: A&O x 3. Affect pleasant   Telemetry   NSR 90s (personally reviewed)  Labs   CBC Recent Labs    10/30/24 0553  WBC 8.2  HGB 9.4*  HCT 28.8*  MCV 95.7  PLT 216   Basic Metabolic Panel Recent Labs    98/85/73 0559 10/28/24 1521 10/29/24 0425 10/30/24 0553  NA 137   < > 135 134*  K 4.4   < > 4.1 4.2  CL 101   < > 99 100  CO2 28   < > 28 26  GLUCOSE 84   < > 93 90  BUN 22   < > 21 21  CREATININE 1.21   < > 1.20 1.11  CALCIUM  8.6*   < > 8.4* 8.6*  MG 2.2  --   --   --   PHOS 2.7  --  3.0 2.8   < > = values in this interval not displayed.   Liver Function Tests Recent Labs    10/29/24 0425 10/30/24 0553  ALBUMIN  2.9* 2.9*   BNP (last 3 results)  Recent Labs    06/10/24 1213 07/09/24 1649 07/23/24 0959  BNP 1,071.5* 3,316.0* 595.8*   ProBNP (last 3 results) Recent Labs    10/15/24 0400 10/21/24 0433 10/28/24 0559  PROBNP 9,508.0* 5,277.0* 8,170.0*   Medications:    Scheduled Medications:  amiodarone   200 mg Oral BID   Chlorhexidine  Gluconate Cloth  6 each Topical Daily   collagenase    Topical Daily   digoxin   0.0625 mg Oral Daily   furosemide   40 mg Oral Daily   gabapentin   200 mg Oral QHS   insulin  aspart  0-15 Units Subcutaneous TID WC   insulin  aspart  0-5 Units Subcutaneous QHS   insulin  aspart  2 Units Subcutaneous TID WC   lactose free nutrition  237 mL Oral TID BM   mexiletine  150 mg Oral Q12H   multivitamin with minerals  1 tablet Oral Daily   pantoprazole   40 mg Oral Daily   polyethylene glycol  17 g Oral Daily   potassium chloride   40 mEq Oral Daily   senna-docusate  1 tablet Oral Daily   sildenafil   20 mg Oral TID    sodium chloride  flush  10-40 mL Intracatheter Q12H   spironolactone   50 mg Oral BID   Warfarin - Pharmacist Dosing Inpatient   Does not apply q1600    Infusions:    PRN Medications: guaiFENesin -dextromethorphan , ondansetron  (ZOFRAN ) IV, mouth rinse, oxyCODONE , phenol, polyethylene glycol, senna  Assessment/Plan   Acute on chronic systolic CHF, NYHA IV: Nonischemic cardiomyopathy. RHC 12/18 showed elevated filling pressures with mod-severe mixed pulmonary arterial/pulmonary venous hypertension and low CO despite milrinone  0.25, increased to 0.5. Impella 5.5 placed 12/23 for further optimization. - S/p HM III LVAD implant by Dr. Lucas 10/14/24. - iNO off 1/1; Milrinone  off 1/11. Co-ox stable.  - Ramp echo 1/9, increased speed to 5500 rpm. TEE 1/12 decreased speed to 5400. - Volume ok. Continue Lasix  40 mg daily.  - Continue sildenafil  20 mg TID for RV support - Continue digoxin  0.0625 mg daily. Dig level 0.9 1/13 - Continue spironolactone  50 mg bid - MAPs 80s-90s   Pulmonary hypertension: Severe combined pre/post capillary PH on RHC, suspect largely due to venous remodeling. Improved with adequate unloading.  AKI on CKD stage 3: Resolved, Scr 1.1  - follow BMP   PVCs/NSVT: Improved. Has Biotronik ICD. Tachy therapies turned on  - Continue mexiletine to 150 mg bid.    Persistent Atrial fibrillation:  - S/P DCCV 1/12. Maintaining NSR  - Continue amio 200 mg bid - On warfarin  Meningioma: Monitoring.   Prostate cancer: Treated with radiation.   Thrombocytopenia: suspect consumptive with impella.  - HIT and SRA negative - resolved  ABLA:  - stable hgb  Hypokalemia/ Hypomagnesemia  - repleated  - supp PRN   Deconditioning - continue PT/OT - CIR team following.   Constipation - resolved   Ok to go to [facility]. Awaiting insurance authorization.   Caffie Shed, PA-C 10/30/24, 12:08 PM  Advanced Heart Failure Team Pager [phone] (M-F; 7a - 5p)    Please visit  [email or web address]: For overnight coverage please call cardiology fellow first. If fellow not available call Shock/ECMO MD on call.  For ECMO / Mechanical Support (Impella, IABP, LVAD) issues call Shock / ECMO MD on call.    Patient seen and examined with the above-signed Advanced Practice Provider and/or Housestaff. I personally reviewed laboratory data, imaging studies and relevant notes. I independently examined the patient and formulated the important aspects of the plan.  I have edited the note to reflect any of my changes or salient points. I have personally discussed the plan with the patient and/or family.  Feels good. Co-ox stable of milrinone . Volume status looks good.  MAPs ok. INR 1.8   VAD interrogated personally. Parameters stable.  General:  NAD.  HEENT: normal  Neck: supple. JVP not elevated.  Carotids 2+ bilat; no bruits. No lymphadenopathy or thryomegaly appreciated. Cor: LVAD hum.  Lungs: Clear. Abdomen: soft, nontender, non-distended. No hepatosplenomegaly. No bruits or masses. Good bowel sounds. Driveline site clean. Anchor in place.  Extremities: no cyanosis, clubbing, rash. Warm no edema  Neuro: alert & oriented x 3. No focal deficits. Moves all 4 without problem   He looks great. Has been seen by CIR and now awaiting a bed,   Continue current regimen.  Discussed warfarin dosing with PharmD personally  Toribio Fuel, MD  7:51 PM

## 2024-10-30 NOTE — Progress Notes (Signed)
 PHARMACY - ANTICOAGULATION CONSULT NOTE  Pharmacy Consult for warfarin Indication: LVAD HM3 + hx AFib  Allergies[1]  Patient Measurements: Height: 6' 6 (198.1 cm) Weight: 97.1 kg (214 lb 1.1 oz) IBW/kg (Calculated) : 91.4 HEPARIN  DW (KG): 108.7  Vital Signs: Temp: 97.9 F (36.6 C) (01/16 0736) Temp Source: Oral (01/16 0736) BP: 102/75 (01/16 0736) Pulse Rate: 68 (01/16 0736)  Labs: Recent Labs    10/28/24 0559 10/28/24 1521 10/29/24 0425 10/30/24 0553  HGB  --   --   --  9.4*  HCT  --   --   --  28.8*  PLT  --   --   --  216  LABPROT 26.6*  --  25.5* 21.9*  INR 2.3*  --  2.2* 1.8*  CREATININE 1.21 1.41* 1.20 1.11    Estimated Creatinine Clearance: 88.1 mL/min (by C-G formula based on SCr of 1.11 mg/dL).   Medical History: Past Medical History:  Diagnosis Date   CKD (chronic kidney disease) stage 2, GFR 60-89 ml/min    COPD (chronic obstructive pulmonary disease) (HCC)    Diabetes mellitus type II, non insulin  dependent (HCC)    Dilated aortic root    Elevated PSA    Hypercholesteremia    Hypertension    NICM (nonischemic cardiomyopathy) (HCC)    NSVT (nonsustained ventricular tachycardia) (HCC)    Obstructive sleep apnea    Paroxysmal atrial fibrillation (HCC)    Pulmonary hypertension (HCC)    PVC's (premature ventricular contractions)    Tobacco abuse     Medications:  Scheduled:   amiodarone   200 mg Oral BID   Chlorhexidine  Gluconate Cloth  6 each Topical Daily   collagenase    Topical Daily   digoxin   0.0625 mg Oral Daily   furosemide   40 mg Oral Daily   gabapentin   200 mg Oral QHS   insulin  aspart  0-15 Units Subcutaneous TID WC   insulin  aspart  0-5 Units Subcutaneous QHS   insulin  aspart  2 Units Subcutaneous TID WC   lactose free nutrition  237 mL Oral TID BM   mexiletine  150 mg Oral Q12H   multivitamin with minerals  1 tablet Oral Daily   pantoprazole   40 mg Oral Daily   polyethylene glycol  17 g Oral Daily   potassium chloride   40  mEq Oral Daily   senna-docusate  1 tablet Oral Daily   sildenafil   20 mg Oral TID   sodium chloride  flush  10-40 mL Intracatheter Q12H   spironolactone   50 mg Oral BID   Warfarin - Pharmacist Dosing Inpatient   Does not apply q1600    Assessment: 63 yom who underwent LVAD HM3 placement on 12/31. Was on apixaban  PTA for hx Afib > now with LVAD plan to change to warfarin.   INR is subtherapeutic at 2.2. Hgb increasing 9.4, plt 216, LDH up 325. Diet is improving, eating 90-100% of meals and Boost supplements three times daily. Remains on amio PO.   Goal of Therapy:  INR 2-2.5 Monitor platelets by anticoagulation protocol: Yes   Plan:  Warfarin 4 mg x1 orally tonight   Monitor daily INR, CBC, and for s/sx of bleeding  F/u PO intake  Thank you for allowing pharmacy to participate in this patient's care,  Vermell Mccallum, PharmD 10/30/2024 7:47 AM  Please check AMION for all [facility] Pharmacy phone numbers After 10:00 PM, call Main Pharmacy [phone]          [1] No Known Allergies

## 2024-10-30 NOTE — Progress Notes (Signed)
 Occupational Therapy Treatment Patient Details Name: James [name] MRN: [id number] DOB: 1961-09-30 Today's Date: 10/30/2024   History of present illness Pt is a 64 yo male who was admitted 10/01/24 for pre-VAD optimization prior to planned surgery. S/p R heart cath 12/18 & 12/24, impella placement 12/23, impella removal and HM III LVAD placement 12/31. Extubated 10/15/24. Developed L sided weakness, CT negative for acute infarct. PMH: CKD IIIa, hx VT, PAF, prostate cancer, meningioma, COPD, DM2, HTN, hypercholesteremia, OSA, pulmonary hypertension, tobacco abuse.   OT comments  Pt making progress towards OT goals. Continues to be limited by decreased cognition, needing mod to max A for transfers into standing, decreased activity tolerance, needs continued education reinforcement of sternal precautions and compensatory strategies. Today Pt required max cues for power change over from wall to batteries, he also required assist for fine motor deficits with initially untwisting the connection site. Pt mod A for UB dressing, max A for LB dressing, max A for peri care in standing. Pt continues to be motivated and requesting short hallway ambulation. VSS but Pt got very sweaty. RN aware. Pt left on the side of bed eating lunch still connected to battery power. VAD settings 5400 flow 5.1lpm Power 3.9W PI2.9 OT will continue to follow acutely - It is essential that Pt have access to comprehensive inpatient rehab of >3 hours daily to maximize safety and independence and address activity tolerance, cognitive deficits, and safely perform ADL WITH sternal precautions.       If plan is discharge home, recommend the following:  A lot of help with walking and/or transfers;A lot of help with bathing/dressing/bathroom;Assistance with cooking/housework;Direct supervision/assist for medications management;Direct supervision/assist for financial management;Assist for transportation;Help with stairs or ramp for entrance    Equipment Recommendations  BSC/3in1    Recommendations for Other Services Rehab consult    Precautions / Restrictions Precautions Precautions: Fall Recall of Precautions/Restrictions: Impaired Precaution/Restrictions Comments: LVAD Restrictions Weight Bearing Restrictions Per Provider Order: Yes RUE Weight Bearing Per Provider Order: Non weight bearing LUE Weight Bearing Per Provider Order: Non weight bearing Other Position/Activity Restrictions: sternal precautions       Mobility Bed Mobility Overal bed mobility: Needs Assistance Bed Mobility: Supine to Sit, Sit to Supine     Supine to sit: Min assist     General bed mobility comments: pt able to come to EoB with HOB elevated and cues to not pull up, min A for trunk elevation    Transfers Overall transfer level: Needs assistance Equipment used: Rolling walker (2 wheels) Transfers: Sit to/from Stand Sit to Stand: Mod assist, From elevated surface           General transfer comment: Pt able to scoot to EOB with mod A, HIGHLY elevated surface, and mod A with rocking momentum to get him on his feet. He required use of gait belt and max A to stand from 3 in1 set to high level over the toilet. Due to his height and body mass his bottom gets stuck in the 3 in 1     Balance Overall balance assessment: Needs assistance Sitting-balance support: No upper extremity supported, Feet supported Sitting balance-Leahy Scale: Fair Sitting balance - Comments: posterior bias   Standing balance support: Bilateral upper extremity supported Standing balance-Leahy Scale: Fair Standing balance comment: benefits from RW for stabilization and bringing weight forward instead of leaning back  ADL either performed or assessed with clinical judgement   ADL Overall ADL's : Needs assistance/impaired Eating/Feeding: Set up Eating/Feeding Details (indicate cue type and reason): assist for opening  containers, seasoning packets etc - Pt attempted but ultimately required assist Grooming: Set up;Sitting;Wash/dry hands Grooming Details (indicate cue type and reason): dependent on BUE for standing balance - requires seated position for safety         Upper Body Dressing : Moderate assistance;Sitting Upper Body Dressing Details (indicate cue type and reason): able to place harness over neck Lower Body Dressing: Maximal assistance Lower Body Dressing Details (indicate cue type and reason): socks Toilet Transfer: Moderate assistance;Rolling walker (2 wheels);Ambulation Toilet Transfer Details (indicate cue type and reason): mod A to boost into standing even from elevated bed Toileting- Clothing Manipulation and Hygiene: Maximal assistance;Sit to/from stand Toileting - Clothing Manipulation Details (indicate cue type and reason): rear peri care, Pt educated on compensatory strategies     Functional mobility during ADLs: Moderate assistance;Rolling walker (2 wheels) General ADL Comments: working on power switch fom cord ot battery. Requires MAX A to organzie, sequence and maintain attention to task; poor error recognition    Extremity/Trunk Assessment Upper Extremity Assessment Upper Extremity Assessment: RUE deficits/detail;LUE deficits/detail RUE Deficits / Details: decreased fine motor, sluggish strength overall 4-/5 grasp RUE Coordination: decreased fine motor LUE Deficits / Details: decreased fine motor generally 4-/5 LUE Coordination: decreased fine motor            Vision   Vision Assessment?: Wears glasses for reading   Perception     Praxis     Communication Communication Factors Affecting Communication: Reduced clarity of speech   Cognition Arousal: Alert Behavior During Therapy: WFL for tasks assessed/performed Cognition: Cognition impaired     Awareness: Online awareness impaired Memory impairment (select all impairments): Short-term memory, Working civil service fast streamer,  Conservation officer, historic buildings Attention impairment (select first level of impairment): Selective attention Executive functioning impairment (select all impairments): Reasoning, Sequencing, Problem solving OT - Cognition Comments: slower processing; needed cues for problem solving during functional tasks                 Following commands: Impaired Following commands impaired: Follows multi-step commands inconsistently, Follows one step commands with increased time      Cueing   Cueing Techniques: Verbal cues, Visual cues, Tactile cues  Exercises      Shoulder Instructions       General Comments      Pertinent Vitals/ Pain       Pain Assessment Pain Assessment: No/denies pain  Home Living                                          Prior Functioning/Environment              Frequency  Min 2X/week        Progress Toward Goals  OT Goals(current goals can now be found in the care plan section)  Progress towards OT goals: Progressing toward goals     Plan      Co-evaluation                 AM-PAC OT 6 Clicks Daily Activity     Outcome Measure   Help from another person eating meals?: A Little Help from another person taking care of personal grooming?: A Little Help from another person toileting, which includes  using toliet, bedpan, or urinal?: A Lot Help from another person bathing (including washing, rinsing, drying)?: A Lot Help from another person to put on and taking off regular upper body clothing?: A Lot Help from another person to put on and taking off regular lower body clothing?: A Lot 6 Click Score: 14    End of Session Equipment Utilized During Treatment: Gait belt;Rolling walker (2 wheels)  OT Visit Diagnosis: Unsteadiness on feet (R26.81);Other abnormalities of gait and mobility (R26.89);Muscle weakness (generalized) (M62.81);Other symptoms and signs involving cognitive function   Activity Tolerance Patient  tolerated treatment well   Patient Left in bed;with call bell/phone within reach;with family/visitor present (sitting EOB on battery power)   Nurse Communication Mobility status;Other (comment) (cognitive deficits)        Time: 8957-8876 OT Time Calculation (min): 41 min  Charges: OT General Charges $OT Visit: 1 Visit OT Treatments $Self Care/Home Management : 23-37 mins $Therapeutic Activity: 8-22 mins  Leita DEL OTR/L Acute Rehabilitation Services Office: [phone]  Leita PARAS [facility] 10/30/2024, 12:25 PM

## 2024-10-30 NOTE — Progress Notes (Signed)
 H&V Care Navigation CSW Progress Note  Outpatient Heart Failure CSW met with patient and patient son at bedside to check in.  Patient in good spirits and hopeful for transfer to CIR Monday so he can continue to work on mobility.  Is happy with his progress and return of his appetite.  Hopeful for continued improvement with device. Reports no CSW needs at this time.  Will continue to follow during implant stay and assist as needed  Andriette HILARIO Leech, LCSW Clinical Social Worker Advanced Heart Failure Clinic Desk#: [phone] Cell#: [phone]

## 2024-10-30 NOTE — Consult Note (Signed)
 "     Physical Medicine and Rehabilitation Consult Reason for Consult:debility Referring Physician: Bensimhon   HPI: James [name] is a 64 y.o. male with a history of prostate cancer, CKD stage III AA, history of PAF and V. tach who was admitted on 10/01/2024 for pre-VAD optimization.  Patient had a heart cath on 1218 and 1224 with Impella placement on 1223 and ultimately LVAD placement on 10/14/2024.  On 10/20/2024 patient developed left-sided weaknessBut CT of the head was negative for infarct.  Patient had a bedside TEE and DC cardioversion for persistent atrial fibrillation on 10/26/2024.  Ejection fraction noted at 20%.  Other issues include acute on chronic kidney disease, acute blood loss anemia, hypokalemia and hypomagnesia anemia.  Patient has been working with therapies and is at a min to mod assist level for sit to stand transfers and walked min assist to 400 feet using a rolling walker.  He has been mod to max assist with basic ADLs.  Patient lives at home with his spouse in a 1 level house with 4-6 steps to enter.  Prior to his admission he was independent and driving.    Home: Home Living Family/patient expects to be discharged to:: Private residence Living Arrangements: Spouse/significant other Available Help at Discharge: Family (potentially can arrange 24/7, wife works second shift) Type of Home: House Home Access: Stairs to enter [employer] of Steps: 4 (in back (easiest access to home per pt), 6 at side) Entrance Stairs-Rails: Can reach both (on x4 stairs in back) Home Layout: One level Bathroom Shower/Tub: Engineer, Manufacturing Systems: Standard Bathroom Accessibility: Yes Home Equipment: Hand held shower head  Lives With: Spouse  Functional History: Prior Function Prior Level of Function : Independent/Modified Independent, Driving, Working/employed Mobility Comments: No AD ADLs Comments: Works as a merchandiser, 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 [employer] Status:  Mobility: Bed Mobility Overal bed mobility: Needs Assistance Bed Mobility: Supine to Sit, Sit to Supine Rolling: Min assist Sidelying to sit: Min assist Supine to sit: Supervision Sit to supine: Min assist Sit to sidelying: Mod assist, +2 for physical assistance, HOB elevated General bed mobility comments: pt able to come to EoB with HOB elevated and cues to not pull up, min A for returning LE to bed at end of session Transfers Overall transfer level: Needs assistance Equipment used: Rolling walker (2 wheels) Transfers: Sit to/from Stand Sit to Stand: Min assist, Mod assist, +2 physical assistance, From elevated surface Bed to/from chair/wheelchair/BSC transfer type:: Step pivot Step pivot transfers: +2 physical assistance, Min assist General transfer comment: pt continues to have sacral pain prehibiting him from scooting to EoB, and then requiring modAx2 for coming to upright, discussed needing to make sure he is closer to [facility] when coming to sitting, that way he will be able to bend his legs for power up to standing Ambulation/Gait Ambulation/Gait assistance: Min assist, +2 safety/equipment Gait Distance (Feet): 400 Feet Assistive device: Rolling walker (2 wheels) Gait Pattern/deviations: Decreased stride length, Step-through pattern General Gait Details: pt continues to benefit from RW to keep upright posture Gait velocity: reduced Gait velocity interpretation: <1.8 ft/sec, indicate of risk for recurrent falls Pre-gait activities: Static march with BIL UEs on RW.    ADL: ADL Overall ADL's : Needs assistance/impaired Eating/Feeding: Set up Grooming: Supervision/safety, Set up Grooming Details (indicate cue type and reason): EOB Upper Body Bathing: Minimal assistance, Sitting Lower Body Bathing: Maximal assistance, Sit to/from stand Upper Body Dressing : Moderate assistance, Sitting Upper  Body Dressing Details (indicate cue type and reason):  able to place harness over neck Lower Body Dressing: Maximal assistance Lower Body Dressing Details (indicate cue type and reason): socks Toilet Transfer: +2 for physical assistance, Moderate assistance, Stand-pivot, Cueing for safety, Cueing for sequencing, BSC/3in1 Toileting- Clothing Manipulation and Hygiene: Maximal assistance Functional mobility during ADLs: Moderate assistance, Rolling walker (2 wheels), +2 for physical assistance General ADL Comments: working on power switch fom cord ot battery. Requires MAX A to organzie, sequence adn maintain attention to task; tried walking before hooked to both batteries - poor error recognition  Cognition: Cognition Orientation Level: Oriented X4 Cognition Arousal: Alert Behavior During Therapy: WFL for tasks assessed/performed   Review of Systems  Constitutional: Negative.   HENT: Negative.    Eyes: Negative.   Respiratory:  Positive for shortness of breath.   Cardiovascular:  Negative for chest pain.  Gastrointestinal: Negative.   Genitourinary: Negative.   Musculoskeletal: Negative.   Skin: Negative.   Neurological:  Positive for weakness.  Psychiatric/Behavioral:  Negative for depression.    Past Medical History:  Diagnosis Date   CKD (chronic kidney disease) stage 2, GFR 60-89 ml/min    COPD (chronic obstructive pulmonary disease) (HCC)    Diabetes mellitus type II, non insulin  dependent (HCC)    Dilated aortic root    Elevated PSA    Hypercholesteremia    Hypertension    NICM (nonischemic cardiomyopathy) (HCC)    NSVT (nonsustained ventricular tachycardia) (HCC)    Obstructive sleep apnea    Paroxysmal atrial fibrillation (HCC)    Pulmonary hypertension (HCC)    PVC's (premature ventricular contractions)    Tobacco abuse    Past Surgical History:  Procedure Laterality Date   CARDIAC CATHETERIZATION  10/15/2000   COLONOSCOPY N/A 03/22/2014   Procedure: COLONOSCOPY;  Surgeon: Margo LITTIE Haddock, MD;  Location: [facility];  Service: Endoscopy;  Laterality: N/A;  11:15 AM   ICD IMPLANT N/A 08/21/2021   Procedure: ICD IMPLANT;  Surgeon: Waddell Danelle ORN, MD;  Location: [facility] INVASIVE CV LAB;  Service: Cardiovascular;  Laterality: N/A;   INGUINAL HERNIA REPAIR Right 06/24/2017   Procedure: HERNIA REPAIR INGUINAL ADULT WITH MESH;  Surgeon: Mavis Anes, MD;  Location: [facility] ORS;  Service: General;  Laterality: Right;   INSERTION OF IMPLANTABLE LEFT VENTRICULAR ASSIST DEVICE N/A 10/14/2024   Procedure: INSERTION OF IMPLANTABLE LEFT VENTRICULAR ASSIST DEVICE HEARTMATE 3; REMOVAL OF IMPELLA 5.5;  Surgeon: Lucas Dorise POUR, MD;  Location: [facility] OR;  Service: Open Heart Surgery;  Laterality: N/A;   INTRAOPERATIVE TRANSESOPHAGEAL ECHOCARDIOGRAM N/A 10/06/2024   Procedure: ECHOCARDIOGRAM, TRANSESOPHAGEAL, INTRAOPERATIVE;  Surgeon: Daniel Con RAMAN, MD;  Location: [facility] OR;  Service: Open Heart Surgery;  Laterality: N/A;   INTRAOPERATIVE TRANSESOPHAGEAL ECHOCARDIOGRAM N/A 10/14/2024   Procedure: ECHOCARDIOGRAM, TRANSESOPHAGEAL, INTRAOPERATIVE;  Surgeon: Lucas Dorise POUR, MD;  Location: [facility] OR;  Service: Open Heart Surgery;  Laterality: N/A;   IR TUNNELED CENTRAL VENOUS CATH PLC W IMG  07/17/2024   PLACEMENT OF IMPELLA LEFT VENTRICULAR ASSIST DEVICE N/A 10/06/2024   Procedure: INSERTION, CARDIAC ASSIST DEVICE, IMPELLA 5.5;  Surgeon: Daniel Con RAMAN, MD;  Location: [facility] OR;  Service: Open Heart Surgery;  Laterality: N/A;   PROSTATE BIOPSY     RIGHT HEART CATH N/A 11/13/2022   Procedure: RIGHT HEART CATH;  Surgeon: Gardenia Led, DO;  Location: [facility] INVASIVE CV LAB;  Service: Cardiovascular;  Laterality: N/A;   RIGHT HEART CATH N/A 12/21/2022   Procedure: RIGHT HEART CATH;  Surgeon: Gardenia Led,  DO;  Location: [facility] INVASIVE CV LAB;  Service: Cardiovascular;  Laterality: N/A;   RIGHT HEART CATH N/A 11/25/2023   Procedure: RIGHT HEART CATH;  Surgeon: Gardenia Led, DO;  Location: [facility] INVASIVE CV LAB;  Service: Cardiovascular;  Laterality:  N/A;   RIGHT HEART CATH N/A 10/01/2024   Procedure: RIGHT HEART CATH;  Surgeon: Rolan Ezra RAMAN, MD;  Location[facility] INVASIVE CV LAB;  Service: Cardiovascular;  Laterality: N/A;   RIGHT HEART CATH N/A 10/07/2024   Procedure: RIGHT HEART CATH;  Surgeon: Cherrie Toribio SAUNDERS, MD;  Location: [facility] INVASIVE CV LAB;  Service: Cardiovascular;  Laterality: N/A;   RIGHT/LEFT HEART CATH AND CORONARY ANGIOGRAPHY N/A 07/19/2021   Procedure: RIGHT/LEFT HEART CATH AND CORONARY ANGIOGRAPHY;  Surgeon: Verlin Lonni BIRCH, MD;  Location: [facility] INVASIVE CV LAB;  Service: Cardiovascular;  Laterality: N/A;   TEE WITHOUT CARDIOVERSION N/A 12/21/2022   Procedure: TRANSESOPHAGEAL ECHOCARDIOGRAM (TEE);  Surgeon: Gardenia Led, DO;  Location: [facility] ENDOSCOPY;  Service: Cardiovascular;  Laterality: N/A;   Family History  Problem Relation Age of Onset   Coronary artery disease Mother    Diabetes Father    Heart attack Father    Hypertension Father    Iron deficiency Father    Thyroid  disease Sister    Cardiomyopathy Brother        No significant coronary disease by coronary angiography   Heart disease Maternal Grandfather        Also paternal grandfather   Sudden death Other    Colon cancer Neg Hx    Social History:  reports that he quit smoking about 3 years ago. His smoking use included cigarettes. He started smoking about 42 years ago. He has a 19.4 pack-year smoking history. He has never used smokeless tobacco. He reports that he does not drink alcohol and does not use drugs. Allergies: Allergies[1] Medications Prior to Admission  Medication Sig Dispense Refill   acetaminophen  (TYLENOL ) 500 MG tablet Take 1,000 mg by mouth every 6 (six) hours as needed for moderate pain.     albuterol  (VENTOLIN  HFA) 108 (90 Base) MCG/ACT inhaler INHALE 2 PUFFS BY MOUTH FOUR TIMES DAILY AS NEEDED (Patient taking differently: Inhale 1 puff into the lungs every 6 (six) hours as needed for shortness of breath.) 6.7 g 1    amiodarone  (PACERONE ) 200 MG tablet Take 1 tablet (200 mg total) by mouth daily. 30 tablet 5   apixaban  (ELIQUIS ) 5 MG TABS tablet Take 1 tablet (5 mg total) by mouth 2 (two) times daily. 60 tablet 11   atorvastatin  (LIPITOR) 40 MG tablet Take 1 tablet (40 mg total) by mouth daily. 90 tablet 3   digoxin  (LANOXIN ) 0.125 MG tablet Take 1 tablet (0.125 mg total) by mouth daily. 30 tablet 5   FARXIGA  10 MG TABS tablet TAKE 1 TABLET(10 MG) BY MOUTH DAILY BEFORE BREAKFAST 90 tablet 3   hydrALAZINE  (APRESOLINE ) 50 MG tablet Take 1 tablet (50 mg total) by mouth every 8 (eight) hours. 90 tablet 5   metFORMIN (GLUCOPHAGE) 500 MG tablet TAKE 1 TABLET BY MOUTH TWICE DAILY 180 tablet 1   mexiletine (MEXITIL ) 200 MG capsule Take 1 capsule (200 mg total) by mouth every 8 (eight) hours. 90 capsule 5   milrinone  (PRIMACOR ) 20 MG/100 ML SOLN infusion Inject 0.0294 mg/min into the vein continuous.     ondansetron  (ZOFRAN ) 4 MG tablet Take 1 tablet (4 mg total) by mouth every 8 (eight) hours as needed for nausea or vomiting. 20 tablet 3  potassium chloride  SA (KLOR-CON  M) 20 MEQ tablet Take 1 tablet (20 mEq total) by mouth daily. 30 tablet 3   sacubitril -valsartan  (ENTRESTO ) 24-26 MG Take 1 tablet by mouth 2 (two) times daily. 90 tablet 2   spironolactone  (ALDACTONE ) 25 MG tablet Take 1 tablet (25 mg total) by mouth at bedtime. 90 tablet 3   torsemide  (DEMADEX ) 20 MG tablet Take 1 tablet (20 mg total) by mouth daily. 30 tablet 3     Blood pressure 102/75, pulse 68, temperature 97.9 F (36.6 C), temperature source Oral, resp. rate 17, height 6' 6 (1.981 m), weight 97.1 kg, SpO2 94%. Physical Exam Constitutional:      Appearance: He is obese.  HENT:     Head: Normocephalic.     Right Ear: External ear normal.     Left Ear: External ear normal.     Nose: Nose normal.  Eyes:     Extraocular Movements: Extraocular movements intact.     Pupils: Pupils are equal, round, and reactive to light.  Cardiovascular:      Comments: LVAD Hum Pulmonary:     Effort: Pulmonary effort is normal.  Abdominal:     General: There is no distension.     Palpations: Abdomen is soft.     Tenderness: There is no abdominal tenderness.     Comments: Sutures/LVAD drive line noted  Musculoskeletal:        General: Normal range of motion.     Cervical back: Normal range of motion.     Right lower leg: No edema.     Left lower leg: No edema.  Skin:    General: Skin is warm and dry.  Neurological:     Mental Status: He is alert.     Comments: Alert and oriented x 3. Normal insight and awareness. Intact Memory. Normal language and speech. Cranial nerve exam unremarkable. MMT: BUE 4+/5 prox to distal. BLE: 3/5 HF, 3+KE and 4/5 ADF/PF. Sensory exam normal for light touch and pain in all 4 limbs. No limb ataxia or cerebellar signs. No abnormal tone appreciated.  SABRA    Psychiatric:        Mood and Affect: Mood normal.        Behavior: Behavior normal.     Results for orders placed or performed during the hospital encounter of 10/01/24 (from the past 24 hours)  Ammonia     Status: None   Collection Time: 10/29/24 11:00 AM  Result Value Ref Range   Ammonia 16 9 - 35 umol/L  Glucose, capillary     Status: Abnormal   Collection Time: 10/29/24 12:06 PM  Result Value Ref Range   Glucose-Capillary 164 (H) 70 - 99 mg/dL  Glucose, capillary     Status: None   Collection Time: 10/29/24  4:08 PM  Result Value Ref Range   Glucose-Capillary 91 70 - 99 mg/dL  Glucose, capillary     Status: Abnormal   Collection Time: 10/29/24  8:57 PM  Result Value Ref Range   Glucose-Capillary 150 (H) 70 - 99 mg/dL  Cooxemetry Panel (carboxy, met, total hgb, O2 sat)     Status: Abnormal   Collection Time: 10/30/24  5:52 AM  Result Value Ref Range   Total hemoglobin 9.7 (L) 12.0 - 16.0 g/dL   O2 Saturation 23.1 %   Carboxyhemoglobin 2.8 (H) 0.5 - 1.5 %   Methemoglobin 0.7 0.0 - 1.5 %  Lactate dehydrogenase     Status: Abnormal    Collection Time:  10/30/24  5:53 AM  Result Value Ref Range   LDH 325 (H) 105 - 235 U/L  Protime-INR     Status: Abnormal   Collection Time: 10/30/24  5:53 AM  Result Value Ref Range   Prothrombin Time 21.9 (H) 11.4 - 15.2 seconds   INR 1.8 (H) 0.8 - 1.2  Renal function panel     Status: Abnormal   Collection Time: 10/30/24  5:53 AM  Result Value Ref Range   Sodium 134 (L) 135 - 145 mmol/L   Potassium 4.2 3.5 - 5.1 mmol/L   Chloride 100 98 - 111 mmol/L   CO2 26 22 - 32 mmol/L   Glucose, Bld 90 70 - 99 mg/dL   BUN 21 8 - 23 mg/dL   Creatinine, Ser 8.88 0.61 - 1.24 mg/dL   Calcium  8.6 (L) 8.9 - 10.3 mg/dL   Phosphorus 2.8 2.5 - 4.6 mg/dL   Albumin  2.9 (L) 3.5 - 5.0 g/dL   GFR, Estimated >39 >39 mL/min   Anion gap 9 5 - 15  CBC     Status: Abnormal   Collection Time: 10/30/24  5:53 AM  Result Value Ref Range   WBC 8.2 4.0 - 10.5 K/uL   RBC 3.01 (L) 4.22 - 5.81 MIL/uL   Hemoglobin 9.4 (L) 13.0 - 17.0 g/dL   HCT 71.1 (L) 60.9 - 47.9 %   MCV 95.7 80.0 - 100.0 fL   MCH 31.2 26.0 - 34.0 pg   MCHC 32.6 30.0 - 36.0 g/dL   RDW 81.0 (H) 88.4 - 84.4 %   Platelets 216 150 - 400 K/uL   nRBC 0.0 0.0 - 0.2 %  Glucose, capillary     Status: Abnormal   Collection Time: 10/30/24  6:10 AM  Result Value Ref Range   Glucose-Capillary 102 (H) 70 - 99 mg/dL   CT HEAD WO CONTRAST ( ) Result Date: 10/29/2024 EXAM: CT HEAD WITHOUT CONTRAST 10/29/2024 01:54:24 PM TECHNIQUE: CT of the head was performed without the administration of intravenous contrast. Automated exposure control, iterative reconstruction, and/or weight based adjustment of the mA/kV was utilized to reduce the radiation dose to as low as reasonably achievable. COMPARISON: Head CT 10/21/2024. CLINICAL HISTORY: Altered mental status, nontraumatic (Ped 0-17y). FINDINGS: BRAIN AND VENTRICLES: There is no evidence of an acute infarct, intracranial hemorrhage, midline shift, hydrocephalus, or extra-axial fluid collection. A mildly enlarged,  partially empty sella is unchanged. A 9 mm extra-axial mass projecting leftward along the anterior aspect of the falx is unchanged without evidence of associated brain edema. There is mild cerebral atrophy. Calcified atherosclerosis at the skull base. ORBITS: No acute abnormality. SINUSES: Unchanged complete right frontal sinus and anterior right ethmoid air cell opacification as well as persistent extensive opacification of the included portion of the left maxillary sinus. Clear mastoid air cells. SOFT TISSUES AND SKULL: No acute soft tissue abnormality. No skull fracture. IMPRESSION: 1. No acute intracranial abnormality. 2. Unchanged 9 mm anterior parafalcine meningioma. Electronically signed by: Dasie Hamburg MD 10/29/2024 02:05 PM EST RP Workstation: [id number]    Assessment/Plan: Diagnosis: 64 year old male with debility after prolonged hospital course centered around his nonischemic cardiomyopathy and LVAD placement Does the need for close, 24 hr/day medical supervision in concert with the patient's rehab needs make it unreasonable for this patient to be served in a less intensive setting? Yes Co-Morbidities requiring supervision/potential complications:  - Acute on chronic kidney disease -Atrial fibrillation -History of meningioma -Thrombocytopenia -Acute blood loss anemia -Hypokalemia and hypomagnesemia Due to bladder  management, bowel management, safety, skin/wound care, disease management, medication administration, pain management, and patient education, does the patient require 24 hr/day rehab nursing? Yes Does the patient require coordinated care of a physician, rehab nurse, therapy disciplines of PT, OT to address physical and functional deficits in the context of the above medical diagnosis(es)? Yes Addressing deficits in the following areas: balance, endurance, locomotion, strength, transferring, bowel/bladder control, bathing, dressing, feeding, grooming, toileting, and psychosocial  support Can the patient actively participate in an intensive therapy program of at least 3 hrs of therapy per day at least 5 days per week? Yes The potential for patient to make measurable gains while on inpatient rehab is excellent Anticipated functional outcomes upon discharge from inpatient rehab are modified independent and supervision  with PT, modified independent and supervision with OT, n/a with SLP. Estimated rehab length of stay to reach the above functional goals is: 7-8 days Anticipated discharge destination: Home Overall Rehab/Functional Prognosis: excellent  POST ACUTE RECOMMENDATIONS: This patient's condition is appropriate for continued rehabilitative care in the following setting: CIR Patient has agreed to participate in recommended program. Yes Note that insurance prior authorization may be required for reimbursement for recommended care.  Comment: Rehab Admissions Coordinator to follow up.      I have personally performed a face to face diagnostic evaluation of this patient. Additionally, I have examined the patient's medical record including any pertinent labs and radiographic images.    Thanks,  Arthea ONEIDA Gunther, MD 10/30/2024      [1] No Known Allergies  "

## 2024-10-30 NOTE — Plan of Care (Signed)
  Problem: Education: Goal: Understanding of CV disease, CV risk reduction, and recovery process will improve Outcome: Progressing   Problem: Activity: Goal: Ability to return to baseline activity level will improve Outcome: Progressing   Problem: Education: Goal: Knowledge of General Education information will improve Description: Including pain rating scale, medication(s)/side effects and non-pharmacologic comfort measures Outcome: Progressing

## 2024-10-30 NOTE — Plan of Care (Signed)
" °  Problem: Activity: Goal: Ability to return to baseline activity level will improve Outcome: Progressing   Problem: Health Behavior/Discharge Planning: Goal: Ability to manage health-related needs will improve Outcome: Progressing   Problem: Clinical Measurements: Goal: Will remain free from infection Outcome: Progressing Goal: Diagnostic test results will improve Outcome: Progressing Goal: Respiratory complications will improve Outcome: Progressing Goal: Cardiovascular complication will be avoided Outcome: Progressing   Problem: Activity: Goal: Risk for activity intolerance will decrease Outcome: Progressing   Problem: Nutrition: Goal: Adequate nutrition will be maintained Outcome: Progressing   Problem: Elimination: Goal: Will not experience complications related to bowel motility Outcome: Progressing Goal: Will not experience complications related to urinary retention Outcome: Progressing   Problem: Pain Managment: Goal: General experience of comfort will improve and/or be controlled Outcome: Progressing   Problem: Safety: Goal: Ability to remain free from injury will improve Outcome: Progressing   Problem: Skin Integrity: Goal: Risk for impaired skin integrity will decrease Outcome: Progressing   "

## 2024-10-30 NOTE — Progress Notes (Signed)
 Inpatient Rehab Admissions Coordinator:  ?Insurance authorization started. Will continue to follow. ? ? ?Tinnie Yvone Cohens, MS, CCC-SLP ?Admissions Coordinator ?[phone] ? ?

## 2024-10-30 NOTE — Progress Notes (Signed)
 LVAD Coordinator Rounding Note:  Admitted 10/01/24 for optimization prior to LVAD implant. Impella 5.5 placed 10/06/24.  HM 3 LVAD implanted on 10/14/24 by Dr Lucas under destination therapy criteria.  12/18: Milrinone  titrated from 0.25 to 0.5 mcg/kg/min post RHC d/t low-output and pulmonary hypertension 12/19: NE added 12/22: Markedly elevated PA pressures, elevated PCWP and elevated SVR. Titrated off NE. Added DBA.  12/23: Impella 5.5 Implanted.  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 Heparin  > Bival.  12/31: HM III LVAD implant 1/5: LVAD speed increased to 5400  1/6: LUE weakness, head CT negative  1/9: Ramp echo, speed increased to 5500 rpm 1/11: Off milrinone . 1/12: bedside TEE/DCCV. EF 20%, mod/sev HK. VAD speed decreased to 5400 14: transferred out of CCU  1/15: repeat head CT negative for acute changes.  Pt lying in bed this morning. He is currently paying a bill by phone. Pt is much clearer today mentally.   Discharge education completed 10/28/24. Ongoing driveline dressing change teaching with pt's wife. Plan to perform dressing change with VAD coordinator tomorrow morning.   Planning for pt to go to CIR once insurance approves and pt has a ready bed.  Vital signs: Temp: 98.1 HR: 96 Doppler Pressure: 78 Automatic BP: 108/94 (101) O2 Sat: 95% on RA Wt: 235.4>244.3>243.4>234.8>223.5>239.6>220.2>224.2>214>210.9>214 lb    LVAD interrogation reveals:  Speed: 5400 Flow: 4.9 Power: 4.9 w PI: 3.1   Alarms: none Events: none Hematocrit: 28  Fixed speed: 5400 Low speed limit: 5100   Drive Line: Existing VAD dressing removed and site care performed using sterile technique by caregiver [name] with VAD coordinator observing. Drive line exit site cleaned with Chlora prep applicators x 2, allowed to dry, and gauze dressing with Silverlon patch applied. Exit site unincorporated, the velour is fully implanted at exit site. Small amount of serosanguinous drainage present on previous  dressing. No redness, tenderness, foul odor or rash noted  Cath grip anchor secure. Continue MWF dressing changes by VAD coordinator or nurse champion. Next dressing change due 11/02/24 by VAD coordinator or nurse champion.     Labs:  LDH trend: 296>384>458>401>275>594>633>705>756>704>405>743>674  INR trend: 1.3>1.4>1.4>1.6>1.7>1.7>2.0>2.1>2.2>2.3>2.2>1.8  Anticoagulation Plan: - INR Goal: 2.0 - 2.5 - Coumadin  dosing per pharmacy  Blood Products:  Intra-op 10/14/24:   4 FFP  2 platelets  700 cellsaver  DDAVP  x 2 Post-op:  10/15/24: 1 PRBC  Device: - Biotronik -Therapies: ON 10/29/24  Arrythmias: cardioverted from afib on 10/26/24  Respiratory: extubated 10/15/24  Infection:   Drips:  Milrinone  0.125 mcg/kg/min-off Amiodarone  30 mg/hr-off  Adverse Events on VAD:  Patient Education:  Completed 10/28/24. See separate note for details Ongoing dressing change teaching with pt's wife [name]  Plan/Recommendations:  1. Page VAD coordinator for any equipment or drive line concerns 2. Continue Monday, Wednesday, Friday drive line dressing changes by VAD coordinator or nurse champion  Lauraine Ip RN VAD Coordinator  Office: [phone]  24/7 Pager: [phone]

## 2024-10-31 LAB — COOXEMETRY PANEL
Carboxyhemoglobin: 2.4 % — ABNORMAL HIGH (ref 0.5–1.5)
Methemoglobin: <0.7 % (ref 0.0–1.5)
O2 Saturation: 53 %
Total hemoglobin: 10 g/dL — ABNORMAL LOW (ref 12.0–16.0)

## 2024-10-31 LAB — RENAL FUNCTION PANEL
Albumin: 3 g/dL — ABNORMAL LOW (ref 3.5–5.0)
Anion gap: 8 (ref 5–15)
BUN: 21 mg/dL (ref 8–23)
CO2: 25 mmol/L (ref 22–32)
Calcium: 8.5 mg/dL — ABNORMAL LOW (ref 8.9–10.3)
Chloride: 100 mmol/L (ref 98–111)
Creatinine, Ser: 1.18 mg/dL (ref 0.61–1.24)
GFR, Estimated: >60 mL/min
Glucose, Bld: 92 mg/dL (ref 70–99)
Phosphorus: 3 mg/dL (ref 2.5–4.6)
Potassium: 4.5 mmol/L (ref 3.5–5.1)
Sodium: 134 mmol/L — ABNORMAL LOW (ref 135–145)

## 2024-10-31 LAB — PROTIME-INR
INR: 1.6 — ABNORMAL HIGH (ref 0.8–1.2)
Prothrombin Time: 20.1 s — ABNORMAL HIGH (ref 11.4–15.2)

## 2024-10-31 LAB — CBC
HCT: 30.3 % — ABNORMAL LOW (ref 39.0–52.0)
Hemoglobin: 9.8 g/dL — ABNORMAL LOW (ref 13.0–17.0)
MCH: 31.4 pg (ref 26.0–34.0)
MCHC: 32.3 g/dL (ref 30.0–36.0)
MCV: 97.1 fL (ref 80.0–100.0)
Platelets: 210 K/uL (ref 150–400)
RBC: 3.12 MIL/uL — ABNORMAL LOW (ref 4.22–5.81)
RDW: 18.6 % — ABNORMAL HIGH (ref 11.5–15.5)
WBC: 8.8 K/uL (ref 4.0–10.5)
nRBC: 0 % (ref 0.0–0.2)

## 2024-10-31 LAB — LACTATE DEHYDROGENASE: LDH: 261 U/L — ABNORMAL HIGH (ref 105–235)

## 2024-10-31 LAB — GLUCOSE, CAPILLARY
Glucose-Capillary: 99 mg/dL (ref 70–99)
Glucose-Capillary: 77 mg/dL (ref 70–99)
Glucose-Capillary: 109 mg/dL — ABNORMAL HIGH (ref 70–99)
Glucose-Capillary: 127 mg/dL — ABNORMAL HIGH (ref 70–99)

## 2024-10-31 MED ORDER — WARFARIN SODIUM 2 MG PO TABS
4.0000 mg | ORAL_TABLET | Freq: Once | ORAL | Status: AC
  Administered 2024-10-31: 4 mg via ORAL
  Filled 2024-10-31: qty 2

## 2024-10-31 MED ORDER — SENNOSIDES-DOCUSATE SODIUM 8.6-50 MG PO TABS
2.0000 | ORAL_TABLET | Freq: Every day | ORAL | Status: DC
  Administered 2024-11-01: 2 via ORAL
  Filled 2024-10-31: qty 2

## 2024-10-31 MED ORDER — POLYETHYLENE GLYCOL 3350 17 G PO PACK
17.0000 g | PACK | Freq: Every day | ORAL | Status: DC
  Administered 2024-10-31 – 2024-11-02 (×2): 17 g via ORAL
  Filled 2024-10-31 (×2): qty 1

## 2024-10-31 NOTE — Progress Notes (Signed)
 CARDIAC REHAB PHASE I     Post OHS education including restrictions, heart healthy diabetic diet, sternal precautions, IS use, exercise guidelines and CRP2 reviewed. All questions and concerns addressed. Will refer to AP  for CRP2. Pending discharge to CIR.   [id number] Vaughn Asberry Hacking, RN BSN 10/31/2024 10:07 AM

## 2024-10-31 NOTE — Progress Notes (Signed)
 PHARMACY - ANTICOAGULATION CONSULT NOTE  Pharmacy Consult for warfarin Indication: LVAD HM3 + hx AFib  Allergies[1]  Patient Measurements: Height: 6' 6 (198.1 cm) Weight: 95.4 kg (210 lb 5.1 oz) IBW/kg (Calculated) : 91.4 HEPARIN  DW (KG): 108.7  Vital Signs: Temp: 98.3 F (36.8 C) (01/17 0407) Temp Source: Oral (01/17 0407) BP: 96/78 (01/17 0407) Pulse Rate: 82 (01/17 0407)  Labs: Recent Labs    10/29/24 0425 10/30/24 0553 10/31/24 0552  HGB  --  9.4* 9.8*  HCT  --  28.8* 30.3*  PLT  --  216 210  LABPROT 25.5* 21.9* 20.1*  INR 2.2* 1.8* 1.6*  CREATININE 1.20 1.11 1.18    Estimated Creatinine Clearance: 82.8 mL/min (by C-G formula based on SCr of 1.18 mg/dL).   Medical History: Past Medical History:  Diagnosis Date   CKD (chronic kidney disease) stage 2, GFR 60-89 ml/min    COPD (chronic obstructive pulmonary disease) (HCC)    Diabetes mellitus type II, non insulin  dependent (HCC)    Dilated aortic root    Elevated PSA    Hypercholesteremia    Hypertension    NICM (nonischemic cardiomyopathy) (HCC)    NSVT (nonsustained ventricular tachycardia) (HCC)    Obstructive sleep apnea    Paroxysmal atrial fibrillation (HCC)    Pulmonary hypertension (HCC)    PVC's (premature ventricular contractions)    Tobacco abuse     Medications:  Scheduled:   amiodarone   200 mg Oral BID   Chlorhexidine  Gluconate Cloth  6 each Topical Daily   collagenase    Topical Daily   digoxin   0.0625 mg Oral Daily   furosemide   40 mg Oral Daily   gabapentin   200 mg Oral QHS   insulin  aspart  0-15 Units Subcutaneous TID WC   insulin  aspart  0-5 Units Subcutaneous QHS   insulin  aspart  2 Units Subcutaneous TID WC   lactose free nutrition  237 mL Oral TID BM   mexiletine  150 mg Oral Q12H   multivitamin with minerals  1 tablet Oral Daily   pantoprazole   40 mg Oral Daily   polyethylene glycol  17 g Oral Daily   potassium chloride   40 mEq Oral Daily   senna-docusate  1 tablet Oral  Daily   sildenafil   20 mg Oral TID   sodium chloride  flush  10-40 mL Intracatheter Q12H   spironolactone   50 mg Oral BID   Warfarin - Pharmacist Dosing Inpatient   Does not apply q1600    Assessment: 63 yom who underwent LVAD HM3 placement on 12/31. Was on apixaban  PTA for hx Afib > now with LVAD plan to change to warfarin.   10/31/24: INR is subtherapeutic at 1.6. Hgb increasing 9.8, plt 210, LDH down to 261. Diet is improving, eating 90-100% of meals. Patient refused all boost supplements yesterday. Remains on amio PO.   Goal of Therapy:  INR 2-2.5 Monitor platelets by anticoagulation protocol: Yes   Plan:  Warfarin 4 mg x1 orally tonight   Monitor daily INR, CBC, and for s/sx of bleeding  F/u PO intake  Thank you for allowing pharmacy to participate in this patient's care,  Morna Breach, PharmD, BCPS PGY2 Cardiology Pharmacy Resident 10/31/2024 7:23 AM  Please check AMION for all [facility] Pharmacy phone numbers After 10:00 PM, call Main Pharmacy [phone]    [1] No Known Allergies

## 2024-10-31 NOTE — Progress Notes (Signed)
 Patient ID: James [name], male   DOB: 1961-08-30, 64 y.o.   MRN: [id number]     Advanced Heart Failure Rounding Note   AHF Cardiologist: Dr. Zenaida Chief Complaint: Post-op LVAD Patient Profile   James [name] is a 64 y.o. male with history of stage D cardiomyopathy/chronic systolic heart failure now end-stage w/ inotrope dependence, CKD IIIa, hx VT, PAF, prostate cancer, meningioma.   Admitted for LVAD implant/ pre-VAD HF optimization.   Significant events:   12/18: Milrinone  titrated from 0.25 to 0.5 mcg/kg/min post RHC d/t low-output and pulmonary hypertension 12/19: NE added 12/22: Markedly elevated PA pressures, elevated PCWP and elevated SVR. Titrated off NE. Added DBA.  12/23: Impella 5.5 Implanted.  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 Heparin  > Bival.  12/31: HM III LVAD implant 1/5: LVAD speed increased to 5400  1/6: LUE weakness, head CT negative  1/9: Ramp echo, speed increased to 5500 rpm 1/11: Off milrinone . 1/12: bedside TEE/DCCV. EF 20%, mod/sev HK. VAD speed decreased to 5400 1/14: transferred out of CCU  1/15: repeat head CT negative for acute changes   Subjective:    S/P HM3 LVAD 10/14/24.  Feels good. Worried about insurance coverage for CIR No CP or SOB  Co-ox 53%    LVAD INTERROGATION:  HeartMate IIl LVAD:  Flow 4.9 liters/min, speed 5400  power 3.8 PI 3.7 VAD interrogated personally. Parameters stable.  Objective:    Weight Range: 95.4 kg Body mass index is 24.3 kg/m.   Vital Signs:   Temp:  [97.6 F (36.4 C)-98.3 F (36.8 C)] 97.9 F (36.6 C) (01/17 1130) Pulse Rate:  [67-82] 69 (01/17 1130) Resp:  [12-19] 18 (01/17 1130) BP: (81-97)/(65-83) 97/83 (01/17 1130) SpO2:  [94 %-100 %] 95 % (01/17 1130) Weight:  [95.4 kg] 95.4 kg (01/17 0500) Last BM Date : 10/30/23  Doppler MAP: 80s   Weight change: Filed Weights   10/29/24 0524 10/30/24 0500 10/31/24 0500  Weight: 95.7 kg 97.1 kg 95.4 kg   Intake/Output:  Intake/Output Summary (Last  24 hours) at 10/31/2024 1501 Last data filed at 10/31/2024 0900 Gross per 24 hour  Intake 240 ml  Output 750 ml  Net -510 ml    Physical Exam   General:  NAD.  HEENT: normal  Neck: supple. JVP not elevated.  Carotids 2+ bilat; no bruits. No lymphadenopathy or thryomegaly appreciated. Cor: LVAD hum.  Lungs: Clear. Abdomen: soft, nontender, non-distended. No hepatosplenomegaly. No bruits or masses. Good bowel sounds. Driveline site clean. Anchor in place.  Extremities: no cyanosis, clubbing, rash. Warm no edema  Neuro: alert & oriented x 3. No focal deficits. Moves all 4 without problem    Telemetry   NSR 70-80s(personally reviewed)  Labs   CBC Recent Labs    10/30/24 0553 10/31/24 0552  WBC 8.2 8.8  HGB 9.4* 9.8*  HCT 28.8* 30.3*  MCV 95.7 97.1  PLT 216 210   Basic Metabolic Panel Recent Labs    98/83/73 0553 10/31/24 0552  NA 134* 134*  K 4.2 4.5  CL 100 100  CO2 26 25  GLUCOSE 90 92  BUN 21 21  CREATININE 1.11 1.18  CALCIUM  8.6* 8.5*  PHOS 2.8 3.0   Liver Function Tests Recent Labs    10/30/24 0553 10/31/24 0552  ALBUMIN  2.9* 3.0*   BNP (last 3 results) Recent Labs    06/10/24 1213 07/09/24 1649 07/23/24 0959  BNP 1,071.5* 3,316.0* 595.8*   ProBNP (last 3 results) Recent Labs    10/15/24 0400  10/21/24 0433 10/28/24 0559  PROBNP 9,508.0* 5,277.0* 8,170.0*   Medications:    Scheduled Medications:  amiodarone   200 mg Oral BID   Chlorhexidine  Gluconate Cloth  6 each Topical Daily   collagenase    Topical Daily   digoxin   0.0625 mg Oral Daily   furosemide   40 mg Oral Daily   gabapentin   200 mg Oral QHS   insulin  aspart  0-15 Units Subcutaneous TID WC   insulin  aspart  0-5 Units Subcutaneous QHS   insulin  aspart  2 Units Subcutaneous TID WC   lactose free nutrition  237 mL Oral TID BM   mexiletine  150 mg Oral Q12H   multivitamin with minerals  1 tablet Oral Daily   pantoprazole   40 mg Oral Daily   polyethylene glycol  17 g Oral Daily    polyethylene glycol  17 g Oral Daily   potassium chloride   40 mEq Oral Daily   [START ON 11/01/2024] senna-docusate  2 tablet Oral QHS   sildenafil   20 mg Oral TID   sodium chloride  flush  10-40 mL Intracatheter Q12H   spironolactone   50 mg Oral BID   warfarin  4 mg Oral ONCE-1600   Warfarin - Pharmacist Dosing Inpatient   Does not apply q1600    Infusions:    PRN Medications: guaiFENesin -dextromethorphan , ondansetron  (ZOFRAN ) IV, mouth rinse, oxyCODONE , phenol, polyethylene glycol, senna  Assessment/Plan   Acute on chronic systolic CHF, NYHA IV: Nonischemic cardiomyopathy. RHC 12/18 showed elevated filling pressures with mod-severe mixed pulmonary arterial/pulmonary venous hypertension and low CO despite milrinone  0.25, increased to 0.5. Impella 5.5 placed 12/23 for further optimization. - S/p HM III LVAD implant by Dr. Lucas 10/14/24. - iNO off 1/1; Milrinone  off 1/11. Co-ox stable.  - Ramp echo 1/9, increased speed to 5500 rpm. TEE 1/12 decreased speed to 5400. - Volume ok. Continue Lasix  40 mg daily.  - Continue sildenafil  20 mg TID for RV support - Continue digoxin  0.0625 mg daily. Dig level 0.9 1/13 - Continue spironolactone  50 mg bid - MAPs 80s   - INR 1.6 Discussed warfarin dosing with PharmD personally.  Pulmonary hypertension: Severe combined pre/post capillary PH on RHC, suspect largely due to venous remodeling. Improved with adequate unloading.  AKI on CKD stage 3: Resolved, Scr stabke - follow BMP   PVCs/NSVT: Improved. Has Biotronik ICD. Tachy therapies turned on  - Continue mexiletine to 150 mg bid.    Persistent Atrial fibrillation:  - S/P DCCV 1/12. Remains in NSR - Continue amio 200 mg bid - On warfarin  Meningioma: Monitoring.   Prostate cancer: Treated with radiation.   Thrombocytopenia: suspect consumptive with impella.  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   Constipation - resolved   Ok to go to [facility]. Awaiting insurance authorization.   Toribio Fuel, MD 10/31/24, 3:01 PM  Advanced Heart Failure Team Pager [phone] (M-F; 7a - 5p)    Please visit [email or web address]: For overnight coverage please call cardiology fellow first. If fellow not available call Shock/ECMO MD on call.  For ECMO / Mechanical Support (Impella, IABP, LVAD) issues call Shock / ECMO MD on call.

## 2024-10-31 NOTE — Plan of Care (Signed)
" °  Problem: Activity: Goal: Ability to return to baseline activity level will improve Outcome: Progressing   Problem: Education: Goal: Knowledge of General Education information will improve Description: Including pain rating scale, medication(s)/side effects and non-pharmacologic comfort measures Outcome: Progressing   Problem: Activity: Goal: Risk for activity intolerance will decrease Outcome: Progressing   Problem: Nutrition: Goal: Adequate nutrition will be maintained Outcome: Progressing   "

## 2024-10-31 NOTE — Plan of Care (Signed)
   Problem: Education: Goal: Understanding of CV disease, CV risk reduction, and recovery process will improve Outcome: Progressing   Problem: Activity: Goal: Ability to return to baseline activity level will improve Outcome: Progressing   Problem: Cardiovascular: Goal: Ability to achieve and maintain adequate cardiovascular perfusion will improve Outcome: Progressing

## 2024-11-01 LAB — RENAL FUNCTION PANEL
Albumin: 3.2 g/dL — ABNORMAL LOW (ref 3.5–5.0)
Anion gap: 11 (ref 5–15)
BUN: 21 mg/dL (ref 8–23)
CO2: 25 mmol/L (ref 22–32)
Calcium: 8.5 mg/dL — ABNORMAL LOW (ref 8.9–10.3)
Chloride: 99 mmol/L (ref 98–111)
Creatinine, Ser: 1.12 mg/dL (ref 0.61–1.24)
GFR, Estimated: >60 mL/min
Glucose, Bld: 113 mg/dL — ABNORMAL HIGH (ref 70–99)
Phosphorus: 3 mg/dL (ref 2.5–4.6)
Potassium: 4.6 mmol/L (ref 3.5–5.1)
Sodium: 135 mmol/L (ref 135–145)

## 2024-11-01 LAB — PROTIME-INR
INR: 1.6 — ABNORMAL HIGH (ref 0.8–1.2)
Prothrombin Time: 19.7 s — ABNORMAL HIGH (ref 11.4–15.2)

## 2024-11-01 LAB — COOXEMETRY PANEL
Carboxyhemoglobin: 2.2 % — ABNORMAL HIGH (ref 0.5–1.5)
Carboxyhemoglobin: 1.5 % (ref 0.5–1.5)
Methemoglobin: <0.7 % (ref 0.0–1.5)
Methemoglobin: <0.7 % (ref 0.0–1.5)
O2 Saturation: 44.6 %
O2 Saturation: 68.4 %
Total hemoglobin: 11.2 g/dL — ABNORMAL LOW (ref 12.0–16.0)
Total hemoglobin: 10.6 g/dL — ABNORMAL LOW (ref 12.0–16.0)

## 2024-11-01 LAB — GLUCOSE, CAPILLARY
Glucose-Capillary: 93 mg/dL (ref 70–99)
Glucose-Capillary: 111 mg/dL — ABNORMAL HIGH (ref 70–99)
Glucose-Capillary: 142 mg/dL — ABNORMAL HIGH (ref 70–99)
Glucose-Capillary: 122 mg/dL — ABNORMAL HIGH (ref 70–99)

## 2024-11-01 LAB — CBC
HCT: 34 % — ABNORMAL LOW (ref 39.0–52.0)
Hemoglobin: 11 g/dL — ABNORMAL LOW (ref 13.0–17.0)
MCH: 31.2 pg (ref 26.0–34.0)
MCHC: 32.4 g/dL (ref 30.0–36.0)
MCV: 96.3 fL (ref 80.0–100.0)
Platelets: 249 K/uL (ref 150–400)
RBC: 3.53 MIL/uL — ABNORMAL LOW (ref 4.22–5.81)
RDW: 18.4 % — ABNORMAL HIGH (ref 11.5–15.5)
WBC: 9.4 K/uL (ref 4.0–10.5)
nRBC: 0 % (ref 0.0–0.2)

## 2024-11-01 LAB — LACTATE DEHYDROGENASE: LDH: 260 U/L — ABNORMAL HIGH (ref 105–235)

## 2024-11-01 MED ORDER — BOOST PLUS PO LIQD
237.0000 mL | Freq: Every day | ORAL | Status: DC
  Filled 2024-11-01: qty 237

## 2024-11-01 MED ORDER — WARFARIN SODIUM 5 MG PO TABS
5.0000 mg | ORAL_TABLET | Freq: Once | ORAL | Status: AC
  Administered 2024-11-01: 5 mg via ORAL
  Filled 2024-11-01: qty 1

## 2024-11-01 NOTE — Progress Notes (Signed)
 Inpatient Rehab Admissions Coordinator:  Insurance authorization received. Await bed availability.  Tinnie Yvone Cohens, MS, CCC-SLP Admissions Coordinator [phone]

## 2024-11-01 NOTE — Plan of Care (Signed)
  Problem: Education: Goal: Understanding of CV disease, CV risk reduction, and recovery process will improve Outcome: Progressing   Problem: Activity: Goal: Ability to return to baseline activity level will improve Outcome: Progressing   Problem: Cardiovascular: Goal: Ability to achieve and maintain adequate cardiovascular perfusion will improve Outcome: Progressing   Problem: Health Behavior/Discharge Planning: Goal: Ability to safely manage health-related needs after discharge will improve Outcome: Progressing   

## 2024-11-01 NOTE — Progress Notes (Signed)
 Patient ID: James [name], male   DOB: 11/22/1960, 64 y.o.   MRN: [id number]     Advanced Heart Failure Rounding Note   [facility] Cardiologist: Dr. Zenaida Chief Complaint: Post-op LVAD Patient Profile   James [name] is a 64 y.o. male with history of stage D cardiomyopathy/chronic systolic heart failure now end-stage w/ inotrope dependence, CKD IIIa, hx VT, PAF, prostate cancer, meningioma.   Admitted for LVAD implant/ pre-VAD HF optimization.   Significant events:   12/18: Milrinone  titrated from 0.25 to 0.5 mcg/kg/min post RHC d/t low-output and pulmonary hypertension 12/19: NE added 12/22: Markedly elevated PA pressures, elevated PCWP and elevated SVR. Titrated off NE. Added DBA.  12/23: Impella 5.5 Implanted.  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 Heparin  > Bival.  12/31: HM III LVAD implant 1/5: LVAD speed increased to 5400  1/6: LUE weakness, head CT negative  1/9: Ramp echo, speed increased to 5500 rpm 1/11: Off milrinone . 1/12: bedside TEE/DCCV. EF 20%, mod/sev HK. VAD speed decreased to 5400 1/14: transferred out of CCU  1/15: repeat head CT negative for acute changes   Subjective:    S/P HM3 LVAD 10/14/24.  Feels good. Ambulating halls. No CP or SOB    LVAD INTERROGATION:  HeartMate IIl LVAD:  Flow 4.7 liters/min, speed 5400  power 3.9 PI 3.8 VAD interrogated personally. Parameters stable.  Objective:    Weight Range: 94.5 kg Body mass index is 24.08 kg/m.   Vital Signs:   Temp:  [97.9 F (36.6 C)-98.9 F (37.2 C)] 98.6 F (37 C) (01/18 0709) Pulse Rate:  [61-74] 74 (01/18 0709) Resp:  [15-18] 17 (01/18 0709) BP: (86-113)/(73-90) 113/90 (01/18 0709) SpO2:  [94 %-99 %] 95 % (01/18 0709) Weight:  [94.5 kg] 94.5 kg (01/18 0430) Last BM Date : 10/30/23  Doppler MAP: 80s  Weight change: Filed Weights   10/30/24 0500 10/31/24 0500 11/01/24 0430  Weight: 97.1 kg 95.4 kg 94.5 kg   Intake/Output:  Intake/Output Summary (Last 24 hours) at 11/01/2024 0948 Last data  filed at 10/31/2024 1700 Gross per 24 hour  Intake 480 ml  Output 1450 ml  Net -970 ml    Physical Exam   General:  NAD.  HEENT: normal  Neck: supple. JVP not elevated.  Carotids 2+ bilat; no bruits. No lymphadenopathy or thryomegaly appreciated. Cor: LVAD hum.  Lungs: Clear. Abdomen: obese soft, nontender, non-distended. No hepatosplenomegaly. No bruits or masses. Good bowel sounds. Driveline site clean. Anchor in place.  Extremities: no cyanosis, clubbing, rash. Warm no edema  Neuro: alert & oriented x 3. No focal deficits. Moves all 4 without problem    Telemetry   NSR 70-80 (personally reviewed)  Labs   CBC Recent Labs    10/31/24 0552 11/01/24 0500  WBC 8.8 9.4  HGB 9.8* 11.0*  HCT 30.3* 34.0*  MCV 97.1 96.3  PLT 210 249   Basic Metabolic Panel Recent Labs    98/82/73 0552 11/01/24 0500  NA 134* 135  K 4.5 4.6  CL 100 99  CO2 25 25  GLUCOSE 92 113*  BUN 21 21  CREATININE 1.18 1.12  CALCIUM  8.5* 8.5*  PHOS 3.0 3.0   Liver Function Tests Recent Labs    10/31/24 0552 11/01/24 0500  ALBUMIN  3.0* 3.2*   BNP (last 3 results) Recent Labs    06/10/24 1213 07/09/24 1649 07/23/24 0959  BNP 1,071.5* 3,316.0* 595.8*   ProBNP (last 3 results) Recent Labs    10/15/24 0400 10/21/24 0433 10/28/24 0559  PROBNP  9,508.0* 5,277.0* 8,170.0*   Medications:    Scheduled Medications:  amiodarone   200 mg Oral BID   Chlorhexidine  Gluconate Cloth  6 each Topical Daily   collagenase    Topical Daily   digoxin   0.0625 mg Oral Daily   furosemide   40 mg Oral Daily   gabapentin   200 mg Oral QHS   insulin  aspart  0-15 Units Subcutaneous TID WC   insulin  aspart  0-5 Units Subcutaneous QHS   insulin  aspart  2 Units Subcutaneous TID WC   lactose free nutrition  237 mL Oral TID BM   mexiletine  150 mg Oral Q12H   multivitamin with minerals  1 tablet Oral Daily   pantoprazole   40 mg Oral Daily   polyethylene glycol  17 g Oral Daily   polyethylene glycol  17 g  Oral Daily   potassium chloride   40 mEq Oral Daily   senna-docusate  2 tablet Oral QHS   sildenafil   20 mg Oral TID   sodium chloride  flush  10-40 mL Intracatheter Q12H   spironolactone   50 mg Oral BID   Warfarin - Pharmacist Dosing Inpatient   Does not apply q1600    Infusions:    PRN Medications: guaiFENesin -dextromethorphan , ondansetron  (ZOFRAN ) IV, mouth rinse, oxyCODONE , phenol, polyethylene glycol, senna  Assessment/Plan   Acute on chronic systolic CHF, NYHA IV: Nonischemic cardiomyopathy. RHC 12/18 showed elevated filling pressures with mod-severe mixed pulmonary arterial/pulmonary venous hypertension and low CO despite milrinone  0.25, increased to 0.5. Impella 5.5 placed 12/23 for further optimization. - S/p HM III LVAD implant by Dr. Lucas 10/14/24. - iNO off 1/1; Milrinone  off 1/11. Co-ox stable.  - Ramp echo 1/9, increased speed to 5500 rpm. TEE 1/12 decreased speed to 5400. - Volume ok. Continue Lasix  40 mg daily.  - Co-ox this am 45% suspect spurious. Will repeat. Then d/c co-ox draes - Continue sildenafil  20 mg TID for RV support - Continue digoxin  0.0625 mg daily. Dig level 0.9 1/13 - Continue spironolactone  50 mg bid - MAPs 80s - INR 1.6 Discussed warfarin dosing with PharmD personally.  Pulmonary hypertension: Severe combined pre/post capillary PH on RHC - resolved post VAD  AKI on CKD stage 3: Resolved, Scr stable - follow BMP   PVCs/NSVT: Improved. Has Biotronik ICD. Tachy therapies turned on  - Continue mexiletine to 150 mg bid.   Persistent Atrial fibrillation:  - S/P DCCV 1/12. Remains in NSR - Continue amio 200 mg bid - on warfarin  Meningioma: Monitoring.   Prostate cancer: Treated with radiation.   Thrombocytopenia: suspect consumptive with impella.  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   Constipation - resolved    Ok to go to [facility]. Awaiting insurance authorization.   Toribio Fuel, MD 11/01/24, 9:48 AM  Advanced Heart Failure Team Pager [phone] (M-F; 7a - 5p)    Please visit [email or web address]: For overnight coverage please call cardiology fellow first. If fellow not available call Shock/ECMO MD on call.  For ECMO / Mechanical Support (Impella, IABP, LVAD) issues call Shock / ECMO MD on call.

## 2024-11-01 NOTE — Progress Notes (Signed)
 PHARMACY - ANTICOAGULATION CONSULT NOTE  Pharmacy Consult for warfarin Indication: LVAD HM3 + hx AFib  Allergies[1]  Patient Measurements: Height: 6' 6 (198.1 cm) Weight: 94.5 kg (208 lb 6.4 oz) IBW/kg (Calculated) : 91.4 HEPARIN  DW (KG): 108.7  Vital Signs: Temp: 98.3 F (36.8 C) (01/18 0430) Temp Source: Oral (01/18 0430) BP: 92/73 (01/18 0430) Pulse Rate: 64 (01/18 0430)  Labs: Recent Labs    10/30/24 0553 10/31/24 0552 11/01/24 0500  HGB 9.4* 9.8* 11.0*  HCT 28.8* 30.3* 34.0*  PLT 216 210 249  LABPROT 21.9* 20.1* 19.7*  INR 1.8* 1.6* 1.6*  CREATININE 1.11 1.18 1.12    Estimated Creatinine Clearance: 87.3 mL/min (by C-G formula based on SCr of 1.12 mg/dL).   Medical History: Past Medical History:  Diagnosis Date   CKD (chronic kidney disease) stage 2, GFR 60-89 ml/min    COPD (chronic obstructive pulmonary disease) (HCC)    Diabetes mellitus type II, non insulin  dependent (HCC)    Dilated aortic root    Elevated PSA    Hypercholesteremia    Hypertension    NICM (nonischemic cardiomyopathy) (HCC)    NSVT (nonsustained ventricular tachycardia) (HCC)    Obstructive sleep apnea    Paroxysmal atrial fibrillation (HCC)    Pulmonary hypertension (HCC)    PVC's (premature ventricular contractions)    Tobacco abuse     Medications:  Scheduled:   amiodarone   200 mg Oral BID   Chlorhexidine  Gluconate Cloth  6 each Topical Daily   collagenase    Topical Daily   digoxin   0.0625 mg Oral Daily   furosemide   40 mg Oral Daily   gabapentin   200 mg Oral QHS   insulin  aspart  0-15 Units Subcutaneous TID WC   insulin  aspart  0-5 Units Subcutaneous QHS   insulin  aspart  2 Units Subcutaneous TID WC   lactose free nutrition  237 mL Oral TID BM   mexiletine  150 mg Oral Q12H   multivitamin with minerals  1 tablet Oral Daily   pantoprazole   40 mg Oral Daily   polyethylene glycol  17 g Oral Daily   polyethylene glycol  17 g Oral Daily   potassium chloride   40 mEq Oral  Daily   senna-docusate  2 tablet Oral QHS   sildenafil   20 mg Oral TID   sodium chloride  flush  10-40 mL Intracatheter Q12H   spironolactone   50 mg Oral BID   Warfarin - Pharmacist Dosing Inpatient   Does not apply q1600    Assessment: 63 yom who underwent LVAD HM3 placement on 12/31. Was on apixaban  PTA for hx Afib > now with LVAD plan to change to warfarin.   11/01/24: INR is subtherapeutic at 1.6. Hgb increasing 11.0, plt 249, LDH down to 260. Diet is improving, eating over 50% of meals. Patient had on Ensure yesterday. Remains on amio PO.   Goal of Therapy:  INR 2-2.5 Monitor platelets by anticoagulation protocol: Yes   Plan:  Warfarin 5 mg x1 orally tonight   Monitor daily INR, CBC, and for s/sx of bleeding  F/u PO intake  Thank you for allowing pharmacy to participate in this patient's care,  Morna Breach, PharmD, BCPS PGY2 Cardiology Pharmacy Resident 11/01/2024 6:30 AM  Please check AMION for all [facility] Pharmacy phone numbers After 10:00 PM, call Main Pharmacy [phone]    [1] No Known Allergies

## 2024-11-02 ENCOUNTER — Inpatient Hospital Stay (HOSPITAL_COMMUNITY)
Admission: AD | Admit: 2024-11-02 | Discharge: 2024-11-10 | DRG: 945 | Disposition: A | Discharge status: Home / Self Care | Source: Intra-hospital | Attending: Physical Medicine and Rehabilitation | Admitting: Physical Medicine and Rehabilitation

## 2024-11-02 ENCOUNTER — Encounter (HOSPITAL_COMMUNITY): Payer: Self-pay | Admitting: Physical Medicine and Rehabilitation

## 2024-11-02 ENCOUNTER — Other Ambulatory Visit: Payer: Self-pay

## 2024-11-02 DIAGNOSIS — L89152 Pressure ulcer of sacral region, stage 2: Secondary | ICD-10-CM

## 2024-11-02 DIAGNOSIS — Z87891 Personal history of nicotine dependence: Secondary | ICD-10-CM

## 2024-11-02 DIAGNOSIS — N179 Acute kidney failure, unspecified: Secondary | ICD-10-CM | POA: Diagnosis not present

## 2024-11-02 DIAGNOSIS — N1831 Chronic kidney disease, stage 3a: Secondary | ICD-10-CM | POA: Diagnosis present

## 2024-11-02 DIAGNOSIS — E785 Hyperlipidemia, unspecified: Secondary | ICD-10-CM | POA: Diagnosis present

## 2024-11-02 DIAGNOSIS — E1122 Type 2 diabetes mellitus with diabetic chronic kidney disease: Secondary | ICD-10-CM | POA: Diagnosis present

## 2024-11-02 DIAGNOSIS — G8194 Hemiplegia, unspecified affecting left nondominant side: Secondary | ICD-10-CM | POA: Diagnosis present

## 2024-11-02 DIAGNOSIS — E871 Hypo-osmolality and hyponatremia: Secondary | ICD-10-CM | POA: Diagnosis present

## 2024-11-02 DIAGNOSIS — E119 Type 2 diabetes mellitus without complications: Secondary | ICD-10-CM

## 2024-11-02 DIAGNOSIS — C61 Malignant neoplasm of prostate: Secondary | ICD-10-CM | POA: Diagnosis present

## 2024-11-02 DIAGNOSIS — Z95811 Presence of heart assist device: Secondary | ICD-10-CM

## 2024-11-02 DIAGNOSIS — Z8249 Family history of ischemic heart disease and other diseases of the circulatory system: Secondary | ICD-10-CM

## 2024-11-02 DIAGNOSIS — I130 Hypertensive heart and chronic kidney disease with heart failure and stage 1 through stage 4 chronic kidney disease, or unspecified chronic kidney disease: Secondary | ICD-10-CM | POA: Diagnosis present

## 2024-11-02 DIAGNOSIS — K5901 Slow transit constipation: Secondary | ICD-10-CM | POA: Diagnosis not present

## 2024-11-02 DIAGNOSIS — Z95810 Presence of automatic (implantable) cardiac defibrillator: Secondary | ICD-10-CM

## 2024-11-02 DIAGNOSIS — K5900 Constipation, unspecified: Secondary | ICD-10-CM | POA: Diagnosis present

## 2024-11-02 DIAGNOSIS — I4819 Other persistent atrial fibrillation: Secondary | ICD-10-CM | POA: Diagnosis present

## 2024-11-02 DIAGNOSIS — E876 Hypokalemia: Secondary | ICD-10-CM | POA: Diagnosis present

## 2024-11-02 DIAGNOSIS — Z9181 History of falling: Secondary | ICD-10-CM

## 2024-11-02 DIAGNOSIS — I428 Other cardiomyopathies: Secondary | ICD-10-CM | POA: Diagnosis present

## 2024-11-02 DIAGNOSIS — J449 Chronic obstructive pulmonary disease, unspecified: Secondary | ICD-10-CM | POA: Diagnosis present

## 2024-11-02 DIAGNOSIS — R5381 Other malaise: Secondary | ICD-10-CM | POA: Diagnosis not present

## 2024-11-02 DIAGNOSIS — R739 Hyperglycemia, unspecified: Secondary | ICD-10-CM | POA: Diagnosis present

## 2024-11-02 DIAGNOSIS — H6090 Unspecified otitis externa, unspecified ear: Secondary | ICD-10-CM | POA: Diagnosis not present

## 2024-11-02 DIAGNOSIS — K219 Gastro-esophageal reflux disease without esophagitis: Secondary | ICD-10-CM | POA: Diagnosis present

## 2024-11-02 DIAGNOSIS — Z7901 Long term (current) use of anticoagulants: Secondary | ICD-10-CM

## 2024-11-02 DIAGNOSIS — E1165 Type 2 diabetes mellitus with hyperglycemia: Secondary | ICD-10-CM | POA: Diagnosis present

## 2024-11-02 DIAGNOSIS — I472 Ventricular tachycardia, unspecified: Secondary | ICD-10-CM | POA: Diagnosis present

## 2024-11-02 DIAGNOSIS — K30 Functional dyspepsia: Secondary | ICD-10-CM | POA: Diagnosis not present

## 2024-11-02 DIAGNOSIS — I480 Paroxysmal atrial fibrillation: Secondary | ICD-10-CM | POA: Diagnosis present

## 2024-11-02 DIAGNOSIS — Z8546 Personal history of malignant neoplasm of prostate: Secondary | ICD-10-CM

## 2024-11-02 DIAGNOSIS — Z923 Personal history of irradiation: Secondary | ICD-10-CM

## 2024-11-02 DIAGNOSIS — D62 Acute posthemorrhagic anemia: Secondary | ICD-10-CM | POA: Diagnosis present

## 2024-11-02 DIAGNOSIS — Z79899 Other long term (current) drug therapy: Secondary | ICD-10-CM

## 2024-11-02 DIAGNOSIS — D329 Benign neoplasm of meninges, unspecified: Secondary | ICD-10-CM | POA: Diagnosis present

## 2024-11-02 DIAGNOSIS — I5023 Acute on chronic systolic (congestive) heart failure: Secondary | ICD-10-CM | POA: Diagnosis present

## 2024-11-02 DIAGNOSIS — I272 Pulmonary hypertension, unspecified: Secondary | ICD-10-CM | POA: Diagnosis present

## 2024-11-02 DIAGNOSIS — I10 Essential (primary) hypertension: Secondary | ICD-10-CM | POA: Diagnosis present

## 2024-11-02 DIAGNOSIS — L89156 Pressure-induced deep tissue damage of sacral region: Secondary | ICD-10-CM | POA: Diagnosis present

## 2024-11-02 DIAGNOSIS — R570 Cardiogenic shock: Secondary | ICD-10-CM | POA: Diagnosis present

## 2024-11-02 DIAGNOSIS — H9209 Otalgia, unspecified ear: Secondary | ICD-10-CM

## 2024-11-02 DIAGNOSIS — I340 Nonrheumatic mitral (valve) insufficiency: Secondary | ICD-10-CM | POA: Diagnosis present

## 2024-11-02 DIAGNOSIS — H9202 Otalgia, left ear: Secondary | ICD-10-CM | POA: Diagnosis not present

## 2024-11-02 DIAGNOSIS — I493 Ventricular premature depolarization: Secondary | ICD-10-CM | POA: Diagnosis present

## 2024-11-02 DIAGNOSIS — Z7984 Long term (current) use of oral hypoglycemic drugs: Secondary | ICD-10-CM

## 2024-11-02 DIAGNOSIS — E559 Vitamin D deficiency, unspecified: Secondary | ICD-10-CM | POA: Diagnosis present

## 2024-11-02 DIAGNOSIS — I5020 Unspecified systolic (congestive) heart failure: Secondary | ICD-10-CM | POA: Diagnosis present

## 2024-11-02 DIAGNOSIS — I4891 Unspecified atrial fibrillation: Secondary | ICD-10-CM | POA: Diagnosis present

## 2024-11-02 DIAGNOSIS — E7800 Pure hypercholesterolemia, unspecified: Secondary | ICD-10-CM | POA: Diagnosis present

## 2024-11-02 DIAGNOSIS — R1013 Epigastric pain: Secondary | ICD-10-CM

## 2024-11-02 DIAGNOSIS — I5043 Acute on chronic combined systolic (congestive) and diastolic (congestive) heart failure: Secondary | ICD-10-CM | POA: Diagnosis present

## 2024-11-02 DIAGNOSIS — Z833 Family history of diabetes mellitus: Secondary | ICD-10-CM

## 2024-11-02 DIAGNOSIS — I4811 Longstanding persistent atrial fibrillation: Secondary | ICD-10-CM | POA: Diagnosis present

## 2024-11-02 DIAGNOSIS — Z8349 Family history of other endocrine, nutritional and metabolic diseases: Secondary | ICD-10-CM

## 2024-11-02 LAB — CBC
HCT: 33 % — ABNORMAL LOW (ref 39.0–52.0)
Hemoglobin: 10.7 g/dL — ABNORMAL LOW (ref 13.0–17.0)
MCH: 31.2 pg (ref 26.0–34.0)
MCHC: 32.4 g/dL (ref 30.0–36.0)
MCV: 96.2 fL (ref 80.0–100.0)
Platelets: 202 K/uL (ref 150–400)
RBC: 3.43 MIL/uL — ABNORMAL LOW (ref 4.22–5.81)
RDW: 18 % — ABNORMAL HIGH (ref 11.5–15.5)
WBC: 8.6 K/uL (ref 4.0–10.5)
nRBC: 0 % (ref 0.0–0.2)

## 2024-11-02 LAB — RENAL FUNCTION PANEL
Albumin: 3.1 g/dL — ABNORMAL LOW (ref 3.5–5.0)
Anion gap: 8 (ref 5–15)
BUN: 22 mg/dL (ref 8–23)
CO2: 26 mmol/L (ref 22–32)
Calcium: 8.5 mg/dL — ABNORMAL LOW (ref 8.9–10.3)
Chloride: 98 mmol/L (ref 98–111)
Creatinine, Ser: 1.08 mg/dL (ref 0.61–1.24)
GFR, Estimated: >60 mL/min
Glucose, Bld: 110 mg/dL — ABNORMAL HIGH (ref 70–99)
Phosphorus: 2.7 mg/dL (ref 2.5–4.6)
Potassium: 4.2 mmol/L (ref 3.5–5.1)
Sodium: 131 mmol/L — ABNORMAL LOW (ref 135–145)

## 2024-11-02 LAB — GLUCOSE, CAPILLARY
Glucose-Capillary: 102 mg/dL — ABNORMAL HIGH (ref 70–99)
Glucose-Capillary: 145 mg/dL — ABNORMAL HIGH (ref 70–99)
Glucose-Capillary: 110 mg/dL — ABNORMAL HIGH (ref 70–99)
Glucose-Capillary: 188 mg/dL — ABNORMAL HIGH (ref 70–99)

## 2024-11-02 LAB — PROTIME-INR
INR: 1.7 — ABNORMAL HIGH (ref 0.8–1.2)
Prothrombin Time: 20.6 s — ABNORMAL HIGH (ref 11.4–15.2)

## 2024-11-02 LAB — LACTATE DEHYDROGENASE: LDH: 218 U/L (ref 105–235)

## 2024-11-02 MED ORDER — PHENOL 1.4 % MT LIQD: 1.0000 | OROMUCOSAL | Status: DC | PRN

## 2024-11-02 MED ORDER — AMIODARONE HCL 200 MG PO TABS: 200.0000 mg | ORAL_TABLET | Freq: Two times a day (BID) | ORAL | Status: DC

## 2024-11-02 MED ORDER — ONDANSETRON HCL 4 MG/2ML IJ SOLN: 4.0000 mg | Freq: Four times a day (QID) | INTRAMUSCULAR | Status: DC | PRN

## 2024-11-02 MED ORDER — GABAPENTIN 100 MG PO CAPS: 200.0000 mg | ORAL_CAPSULE | Freq: Every day | ORAL | Status: DC

## 2024-11-02 MED ORDER — WARFARIN SODIUM 5 MG PO TABS: 5.0000 mg | ORAL_TABLET | Freq: Once | ORAL | Status: DC

## 2024-11-02 MED ORDER — ZINC SULFATE 220 (50 ZN) MG PO CAPS
220.0000 mg | ORAL_CAPSULE | Freq: Every day | ORAL | Status: DC
  Administered 2024-11-02 – 2024-11-09 (×8): 220 mg via ORAL
  Filled 2024-11-02 (×8): qty 1

## 2024-11-02 MED ORDER — GUAIFENESIN-DM 100-10 MG/5ML PO SYRP: 5.0000 mL | ORAL_SOLUTION | ORAL | Status: DC | PRN

## 2024-11-02 MED ORDER — MEXILETINE HCL 150 MG PO CAPS: 150.0000 mg | ORAL_CAPSULE | Freq: Two times a day (BID) | ORAL | Status: DC

## 2024-11-02 MED ORDER — BOOST PLUS PO LIQD
237.0000 mL | Freq: Every day | ORAL | Status: DC
  Filled 2024-11-02: qty 237

## 2024-11-02 MED ORDER — CHLORHEXIDINE GLUCONATE CLOTH 2 % EX PADS
6.0000 | MEDICATED_PAD | Freq: Every day | CUTANEOUS | Status: DC
  Administered 2024-11-04 – 2024-11-10 (×7): 6 via TOPICAL

## 2024-11-02 MED ORDER — SENNA 8.6 MG PO TABS: 2.0000 | ORAL_TABLET | Freq: Every evening | ORAL | Status: DC | PRN

## 2024-11-02 MED ORDER — DIPHENHYDRAMINE HCL 25 MG PO CAPS
25.0000 mg | ORAL_CAPSULE | Freq: Four times a day (QID) | ORAL | Status: DC | PRN
  Administered 2024-11-09: 25 mg via ORAL

## 2024-11-02 MED ORDER — OXYCODONE HCL 5 MG PO TABS: 5.0000 mg | ORAL_TABLET | ORAL | Status: DC | PRN

## 2024-11-02 MED ORDER — CHLORHEXIDINE GLUCONATE CLOTH 2 % EX PADS: 6.0000 | MEDICATED_PAD | Freq: Every day | CUTANEOUS | Status: DC

## 2024-11-02 MED ORDER — FLEET ENEMA RE ENEM
1.0000 | ENEMA | Freq: Once | RECTAL | Status: AC | PRN
  Administered 2024-11-08: 1 via RECTAL
  Filled 2024-11-02: qty 1

## 2024-11-02 MED ORDER — SODIUM CHLORIDE 0.9% FLUSH
10.0000 mL | Freq: Two times a day (BID) | INTRAVENOUS | Status: DC
  Administered 2024-11-04 – 2024-11-10 (×3): 10 mL

## 2024-11-02 MED ORDER — OXYCODONE HCL 5 MG PO TABS
5.0000 mg | ORAL_TABLET | ORAL | Status: DC | PRN
  Administered 2024-11-02 – 2024-11-05 (×6): 5 mg via ORAL
  Filled 2024-11-02 (×7): qty 1

## 2024-11-02 MED ORDER — DIGOXIN 62.5 MCG PO TABS: 0.0625 mg | ORAL_TABLET | Freq: Every day | ORAL | Status: DC

## 2024-11-02 MED ORDER — COLLAGENASE 250 UNIT/GM EX OINT: TOPICAL_OINTMENT | Freq: Every day | CUTANEOUS | Status: DC

## 2024-11-02 MED ORDER — POLYETHYLENE GLYCOL 3350 17 G PO PACK: 17.0000 g | PACK | Freq: Every day | ORAL | Status: DC

## 2024-11-02 MED ORDER — SPIRONOLACTONE 25 MG PO TABS
50.0000 mg | ORAL_TABLET | Freq: Two times a day (BID) | ORAL | Status: DC
  Administered 2024-11-02 – 2024-11-05 (×6): 50 mg via ORAL
  Filled 2024-11-02 (×6): qty 2

## 2024-11-02 MED ORDER — PANTOPRAZOLE SODIUM 40 MG PO TBEC
40.0000 mg | DELAYED_RELEASE_TABLET | Freq: Every day | ORAL | Status: DC
  Administered 2024-11-03 – 2024-11-10 (×8): 40 mg via ORAL
  Filled 2024-11-02 (×8): qty 1

## 2024-11-02 MED ORDER — BISACODYL 10 MG RE SUPP
10.0000 mg | Freq: Once | RECTAL | Status: AC
  Administered 2024-11-02: 10 mg via RECTAL
  Filled 2024-11-02: qty 1

## 2024-11-02 MED ORDER — SENNOSIDES-DOCUSATE SODIUM 8.6-50 MG PO TABS: 2.0000 | ORAL_TABLET | Freq: Every day | ORAL | Status: DC

## 2024-11-02 MED ORDER — ATORVASTATIN CALCIUM 40 MG PO TABS
40.0000 mg | ORAL_TABLET | Freq: Every day | ORAL | Status: DC
  Administered 2024-11-03 – 2024-11-10 (×8): 40 mg via ORAL
  Filled 2024-11-02 (×6): qty 1
  Filled 2024-11-02: qty 4
  Filled 2024-11-02: qty 1

## 2024-11-02 MED ORDER — DIGOXIN 125 MCG PO TABS
0.0625 mg | ORAL_TABLET | Freq: Every day | ORAL | Status: DC
  Administered 2024-11-03 – 2024-11-10 (×8): 0.0625 mg via ORAL
  Filled 2024-11-02 (×8): qty 1

## 2024-11-02 MED ORDER — POLYETHYLENE GLYCOL 3350 17 G PO PACK: 17.0000 g | PACK | Freq: Every day | ORAL | Status: DC | PRN

## 2024-11-02 MED ORDER — WARFARIN SODIUM 4 MG PO TABS: 4.0000 mg | ORAL_TABLET | Freq: Every day | ORAL | Status: DC

## 2024-11-02 MED ORDER — ACETAMINOPHEN 325 MG PO TABS
325.0000 mg | ORAL_TABLET | ORAL | Status: DC | PRN
  Filled 2024-11-02 (×2): qty 2

## 2024-11-02 MED ORDER — ADULT MULTIVITAMIN W/MINERALS CH: 1.0000 | ORAL_TABLET | Freq: Every day | ORAL | Status: AC

## 2024-11-02 MED ORDER — ADULT MULTIVITAMIN W/MINERALS CH
1.0000 | ORAL_TABLET | Freq: Every day | ORAL | Status: DC
  Administered 2024-11-03 – 2024-11-10 (×8): 1 via ORAL
  Filled 2024-11-02 (×8): qty 1

## 2024-11-02 MED ORDER — WARFARIN SODIUM 2.5 MG PO TABS
5.0000 mg | ORAL_TABLET | Freq: Once | ORAL | Status: AC
  Administered 2024-11-02: 5 mg via ORAL
  Filled 2024-11-02: qty 2

## 2024-11-02 MED ORDER — ORAL CARE MOUTH RINSE: 15.0000 mL | OROMUCOSAL | Status: DC | PRN

## 2024-11-02 MED ORDER — AMIODARONE HCL 200 MG PO TABS
200.0000 mg | ORAL_TABLET | Freq: Two times a day (BID) | ORAL | Status: DC
  Administered 2024-11-02 – 2024-11-08 (×12): 200 mg via ORAL
  Filled 2024-11-02 (×12): qty 1

## 2024-11-02 MED ORDER — SPIRONOLACTONE 50 MG PO TABS: 50.0000 mg | ORAL_TABLET | Freq: Two times a day (BID) | ORAL | Status: DC

## 2024-11-02 MED ORDER — PANTOPRAZOLE SODIUM 40 MG PO TBEC: 40.0000 mg | DELAYED_RELEASE_TABLET | Freq: Every day | ORAL | Status: DC

## 2024-11-02 MED ORDER — SORBITOL 70 % SOLN
60.0000 mL | Status: AC
  Administered 2024-11-02: 60 mL via ORAL
  Filled 2024-11-02: qty 60

## 2024-11-02 MED ORDER — FUROSEMIDE 40 MG PO TABS: 40.0000 mg | ORAL_TABLET | Freq: Every day | ORAL | Status: DC

## 2024-11-02 MED ORDER — FUROSEMIDE 40 MG PO TABS
40.0000 mg | ORAL_TABLET | Freq: Every day | ORAL | Status: DC
  Administered 2024-11-03 – 2024-11-05 (×3): 40 mg via ORAL
  Filled 2024-11-02 (×3): qty 1

## 2024-11-02 MED ORDER — SENNOSIDES-DOCUSATE SODIUM 8.6-50 MG PO TABS
2.0000 | ORAL_TABLET | Freq: Every day | ORAL | Status: DC
  Administered 2024-11-02 – 2024-11-09 (×8): 2 via ORAL
  Filled 2024-11-02 (×8): qty 2

## 2024-11-02 MED ORDER — PROCHLORPERAZINE EDISYLATE 10 MG/2ML IJ SOLN: 5.0000 mg | Freq: Four times a day (QID) | INTRAMUSCULAR | Status: DC | PRN

## 2024-11-02 MED ORDER — SILDENAFIL CITRATE 20 MG PO TABS: 20.0000 mg | ORAL_TABLET | Freq: Three times a day (TID) | ORAL | Status: DC

## 2024-11-02 MED ORDER — PROCHLORPERAZINE MALEATE 5 MG PO TABS: 5.0000 mg | ORAL_TABLET | Freq: Four times a day (QID) | ORAL | Status: DC | PRN

## 2024-11-02 MED ORDER — POTASSIUM CHLORIDE 20 MEQ PO PACK: 40.0000 meq | PACK | Freq: Every day | ORAL | Status: DC

## 2024-11-02 MED ORDER — SILDENAFIL CITRATE 20 MG PO TABS
20.0000 mg | ORAL_TABLET | Freq: Three times a day (TID) | ORAL | Status: DC
  Administered 2024-11-02 – 2024-11-10 (×24): 20 mg via ORAL
  Filled 2024-11-02 (×26): qty 1

## 2024-11-02 MED ORDER — PROCHLORPERAZINE 25 MG RE SUPP: 12.5000 mg | Freq: Four times a day (QID) | RECTAL | Status: DC | PRN

## 2024-11-02 MED ORDER — MEXILETINE HCL 150 MG PO CAPS
150.0000 mg | ORAL_CAPSULE | Freq: Two times a day (BID) | ORAL | Status: DC
  Administered 2024-11-02 – 2024-11-10 (×16): 150 mg via ORAL
  Filled 2024-11-02 (×17): qty 1

## 2024-11-02 MED ORDER — GUAIFENESIN-DM 100-10 MG/5ML PO SYRP: 5.0000 mL | ORAL_SOLUTION | Freq: Four times a day (QID) | ORAL | Status: DC | PRN

## 2024-11-02 MED ORDER — BOOST PLUS PO LIQD: 237.0000 mL | Freq: Every day | ORAL | Status: DC

## 2024-11-02 MED ORDER — ATORVASTATIN CALCIUM 40 MG PO TABS: 40.0000 mg | ORAL_TABLET | Freq: Every day | ORAL | Status: DC

## 2024-11-02 MED ORDER — BISACODYL 10 MG RE SUPP
10.0000 mg | Freq: Every day | RECTAL | Status: DC | PRN
  Administered 2024-11-08: 10 mg via RECTAL
  Filled 2024-11-02: qty 1

## 2024-11-02 MED ORDER — SODIUM CHLORIDE 0.9% FLUSH: 10.0000 mL | Freq: Two times a day (BID) | INTRAVENOUS | Status: DC

## 2024-11-02 MED ORDER — ATORVASTATIN CALCIUM 40 MG PO TABS
40.0000 mg | ORAL_TABLET | Freq: Every day | ORAL | Status: DC
  Administered 2024-11-02: 40 mg via ORAL
  Filled 2024-11-02: qty 1

## 2024-11-02 MED ORDER — SMOG ENEMA
960.0000 mL | Freq: Once | RECTAL | Status: AC
  Administered 2024-11-02: 960 mL via RECTAL
  Filled 2024-11-02: qty 960

## 2024-11-02 MED ORDER — WARFARIN SODIUM 2 MG PO TABS: 4.0000 mg | ORAL_TABLET | Freq: Every day | ORAL | Status: DC

## 2024-11-02 MED ORDER — ALUM & MAG HYDROXIDE-SIMETH 200-200-20 MG/5ML PO SUSP: 30.0000 mL | ORAL | Status: DC | PRN

## 2024-11-02 MED ORDER — POTASSIUM CHLORIDE 20 MEQ PO PACK
40.0000 meq | PACK | Freq: Every day | ORAL | Status: DC
  Administered 2024-11-03 – 2024-11-04 (×2): 40 meq via ORAL
  Filled 2024-11-02 (×2): qty 2

## 2024-11-02 MED ORDER — COLLAGENASE 250 UNIT/GM EX OINT
TOPICAL_OINTMENT | Freq: Every day | CUTANEOUS | Status: AC
  Administered 2024-11-08: 1 via TOPICAL
  Filled 2024-11-02 (×2): qty 30

## 2024-11-02 MED ORDER — WARFARIN - PHARMACIST DOSING INPATIENT: Freq: Every day | Status: DC

## 2024-11-02 MED ORDER — GABAPENTIN 100 MG PO CAPS
200.0000 mg | ORAL_CAPSULE | Freq: Every day | ORAL | Status: DC
  Administered 2024-11-02 – 2024-11-09 (×8): 200 mg via ORAL
  Filled 2024-11-02 (×8): qty 2

## 2024-11-02 MED ORDER — INSULIN ASPART 100 UNIT/ML IJ SOLN
0.0000 [IU] | Freq: Three times a day (TID) | INTRAMUSCULAR | Status: DC
  Administered 2024-11-03 – 2024-11-06 (×5): 2 [IU] via SUBCUTANEOUS
  Administered 2024-11-08: 3 [IU] via SUBCUTANEOUS
  Administered 2024-11-08: 1 [IU] via SUBCUTANEOUS
  Administered 2024-11-09 – 2024-11-10 (×2): 2 [IU] via SUBCUTANEOUS
  Filled 2024-11-02: qty 1
  Filled 2024-11-02 (×5): qty 2

## 2024-11-02 NOTE — Progress Notes (Signed)
 PHARMACY - ANTICOAGULATION CONSULT NOTE  Pharmacy Consult for warfarin Indication: LVAD HM3 + hx AFib  Allergies[1]  Patient Measurements: Height: 6' 6 (198.1 cm) Weight: 91.1 kg (200 lb 12.8 oz) IBW/kg (Calculated) : 91.4 HEPARIN  DW (KG): 108.7  Vital Signs: Temp: 97.8 F (36.6 C) (01/19 1100) Temp Source: Oral (01/19 1100) BP: 87/70 (01/19 1100) Pulse Rate: 67 (01/19 0415)  Labs: Recent Labs    10/31/24 0552 11/01/24 0500 11/02/24 0553 11/02/24 0554  HGB 9.8* 11.0* 10.7*  --   HCT 30.3* 34.0* 33.0*  --   PLT 210 249 202  --   LABPROT 20.1* 19.7* 20.6*  --   INR 1.6* 1.6* 1.7*  --   CREATININE 1.18 1.12  --  1.08    Estimated Creatinine Clearance: 90.2 mL/min (by C-G formula based on SCr of 1.08 mg/dL).   Medical History: Past Medical History:  Diagnosis Date   CKD (chronic kidney disease) stage 2, GFR 60-89 ml/min    COPD (chronic obstructive pulmonary disease) (HCC)    Diabetes mellitus type II, non insulin  dependent (HCC)    Dilated aortic root    Elevated PSA    Hypercholesteremia    Hypertension    NICM (nonischemic cardiomyopathy) (HCC)    NSVT (nonsustained ventricular tachycardia) (HCC)    Obstructive sleep apnea    Paroxysmal atrial fibrillation (HCC)    Pulmonary hypertension (HCC)    PVC's (premature ventricular contractions)    Tobacco abuse     Medications:  Scheduled:   amiodarone   200 mg Oral BID   Chlorhexidine  Gluconate Cloth  6 each Topical Daily   collagenase    Topical Daily   digoxin   0.0625 mg Oral Daily   furosemide   40 mg Oral Daily   gabapentin   200 mg Oral QHS   insulin  aspart  0-15 Units Subcutaneous TID WC   insulin  aspart  0-5 Units Subcutaneous QHS   lactose free nutrition  237 mL Oral Daily   mexiletine  150 mg Oral Q12H   multivitamin with minerals  1 tablet Oral Daily   pantoprazole   40 mg Oral Daily   polyethylene glycol  17 g Oral Daily   polyethylene glycol  17 g Oral Daily   potassium chloride   40 mEq Oral  Daily   senna-docusate  2 tablet Oral QHS   sildenafil   20 mg Oral TID   sodium chloride  flush  10-40 mL Intracatheter Q12H   spironolactone   50 mg Oral BID   [START ON 11/03/2024] warfarin  4 mg Oral q1600   warfarin  5 mg Oral ONCE-1600   Warfarin - Pharmacist Dosing Inpatient   Does not apply q1600    Assessment: 63 yom who underwent LVAD HM3 placement on 12/31. Was on apixaban  PTA for hx Afib > now with LVAD plan to change to warfarin.   11/02/24: INR is subtherapeutic at 1.7. Hgb 10.7 plt 202, LDH down to 260 > 218.  Remains on amio PO.   Goal of Therapy:  INR 2-2.5 Monitor platelets by anticoagulation protocol: Yes   Plan:  Warfarin 5 mg x1 orally tonight; then recommend 4 mg daily at discharge with INR check soon (potentially Friday?) Monitor daily INR, CBC, and for s/sx of bleeding  F/u PO intake  Harlene Barlow, [employer], [facility] Clinical Pharmacist  11/02/2024 12:55 PM   [facility] pharmacy phone numbers are listed on [email or web address]      [1] No Known Allergies

## 2024-11-02 NOTE — Progress Notes (Signed)
 Inpatient Rehabilitation Admission Medication Review by a Pharmacist  A complete drug regimen review was completed for this patient to identify any potential clinically significant medication issues.  High Risk Drug Classes Is patient taking? Indication by Medication  Antipsychotic Yes Compazine -N/V  Anticoagulant Yes Warfarin-LVAD and Afib  Antibiotic No   Opioid Yes Oxy Ir-pain  Antiplatelet No   Hypoglycemics/insulin  Yes Novolog -hyperglycemia  Vasoactive Medication Yes Amiodarone -afib Lasix , digoxin , sildenafil , aldactone -HF  Chemotherapy No   Other Yes Santyl -wound care Zinc , MVI, KCL- supplementation Acetaminophen -pain Maalox-indigestion Bisacodyl , fleets, senna, senna/docusate-constipation Diphenhydramine -itching Guaifenesin /DM-cough Mexilitine-Afib Pantoprazole -GERD Gabapentin -pain      Type of Medication Issue Identified Description of Issue Recommendation(s)  Drug Interaction(s) (clinically significant)     Duplicate Therapy     Allergy     No Medication Administration End Date     Incorrect Dose     Additional Drug Therapy Needed  Patient on atorvasatin during acute admit Suggest restart on admission to CIR  Significant med changes from prior encounter (inform family/care partners about these prior to discharge).    Other       Clinically significant medication issues were identified that warrant physician communication and completion of prescribed/recommended actions by midnight of the next day:  No  Name of provider notified for urgent issues identified:   Provider Method of Notification:     Pharmacist comments:   Time spent performing this drug regimen review (minutes):  20   Dwayne A. Lyle, PharmD, BCPS, FNKF Clinical Pharmacist [place] Please utilize Amion for appropriate phone number to reach the unit pharmacist [facility])  11/02/2024 1:48 PM

## 2024-11-02 NOTE — Discharge Instructions (Signed)
 Information on my medicine - Coumadin    (Warfarin)  This medication education was reviewed with me or my healthcare representative as part of my discharge preparation.  The pharmacist that spoke with me during my hospital stay was:  Bradee Common, Harlene BROCKS, [facility]  Why was Coumadin  prescribed for you? Coumadin  was prescribed for you because you have a blood clot or a medical condition that can cause an increased risk of forming blood clots. Blood clots can cause serious health problems by blocking the flow of blood to the heart, lung, or brain. Coumadin  can prevent harmful blood clots from forming. 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 While on Coumadin  (warfarin) you will need to have an INR test regularly to ensure that your dose is keeping you in the desired range. The INR (international normalized ratio) number is calculated from the result of the laboratory test called prothrombin time (PT).  If an INR APPOINTMENT HAS NOT ALREADY BEEN MADE FOR YOU please schedule an appointment to have this lab work done by your health care provider within 7 days. Your INR goal is usually a number between:  2 to 3 or your provider may give you a more narrow range like 2-2.5.  Ask your health care provider during an office visit what your goal INR is.  What  do you need to  know  About  COUMADIN ? Take Coumadin  (warfarin) exactly as prescribed by your healthcare provider about the same time each day.  DO NOT stop taking without talking to the doctor who prescribed the medication.  Stopping without other blood clot prevention medication to take the place of Coumadin  may increase your risk of developing a new clot or stroke.  Get refills before you run out.  What do you do if you miss a dose? If you miss a dose, take it as soon as you remember on the same day then continue your regularly scheduled regimen the next  day.  Do not take two doses of Coumadin  at the same time.  Important Safety Information A possible side effect of Coumadin  (Warfarin) is an increased risk of bleeding. You should call your healthcare provider right away if you experience any of the following: Bleeding from an injury or your nose that does not stop. Unusual colored urine (red or dark [place]) or unusual colored stools (red or black). Unusual bruising for unknown reasons. A serious fall or if you hit your head (even if there is no bleeding).  Some foods or medicines interact with Coumadin  (warfarin) and might alter your response to warfarin. To help avoid this: Eat a balanced diet, maintaining a consistent amount of Vitamin K. Notify your provider about major diet changes you plan to make. Avoid alcohol or limit your intake to 1 drink for women and 2 drinks for men per day. (1 drink is 5 oz. wine, 12 oz. beer, or 1.5 oz. liquor.)  Make sure that ANY health care provider who prescribes medication for you knows that you are taking Coumadin  (warfarin).  Also make sure the healthcare provider who is monitoring your Coumadin  knows when you have started a new medication including herbals and non-prescription products.  Coumadin  (Warfarin)  Major Drug Interactions  Increased Warfarin Effect Decreased Warfarin Effect  Alcohol (large quantities) Antibiotics (esp. Septra/Bactrim, Flagyl, Cipro) Amiodarone  (Cordarone ) Aspirin  (ASA) Cimetidine (Tagamet) Megestrol (Megace) NSAIDs (ibuprofen, naproxen, etc.) Piroxicam (Feldene) Propafenone (Rythmol SR)  Propranolol (Inderal) Isoniazid (INH) Posaconazole (Noxafil) Barbiturates (Phenobarbital) Carbamazepine (Tegretol) Chlordiazepoxide (Librium) Cholestyramine (Questran) Griseofulvin Oral Contraceptives Rifampin  Sucralfate (Carafate) Vitamin K   Coumadin  (Warfarin) Major Herbal Interactions  Increased Warfarin Effect Decreased Warfarin Effect  Garlic Ginseng Ginkgo biloba  Coenzyme Q10 Green tea St. Johns wort    Coumadin  (Warfarin) FOOD Interactions  Eat a consistent number of servings per week of foods HIGH in Vitamin K (1 serving =  cup)  Collards (cooked, or boiled & drained) Kale (cooked, or boiled & drained) Mustard greens (cooked, or boiled & drained) Parsley *serving size only =  cup Spinach (cooked, or boiled & drained) Swiss chard (cooked, or boiled & drained) Turnip greens (cooked, or boiled & drained)  Eat a consistent number of servings per week of foods MEDIUM-HIGH in Vitamin K (1 serving = 1 cup)  Asparagus (cooked, or boiled & drained) Broccoli (cooked, boiled & drained, or raw & chopped) Brussel sprouts (cooked, or boiled & drained) *serving size only =  cup Lettuce, raw (green leaf, endive, romaine) Spinach, raw Turnip greens, raw & chopped   These websites have more information on Coumadin  (warfarin):  www.coumadin .com; www.ahrq.gov/consumer/coumadin .htm;

## 2024-11-02 NOTE — H&P (Signed)
 Physical Medicine and Rehabilitation Admission H&P     Functional deficits due to debility     HPI: James [name] is a 64 year old male with PMHx of prostate cancer, CKD stage IIa, meningioma, HFrEF due to NICM (dates back to 2011 diagnosed with nonischemic cardiomyopathy), aortic root aneurysm, tobacco abuse, pAF, OSA, HTN, HLD, DM, prostate cancer s/p EBRT & ADT (radiation completed April 2025), who has been home inotrope-dependent on milrinone  0.125 mcg initial echo showed EF 10 to 15% with mod reduced RV.  Echo 7/25 showed EF 25-30%, G3DD, RV mildly reduced, moderate MR.  Initial plan was to follow-up with [facility] transplant team,however after further discussion family decided he would prefer to consider LVAD.   The patient presented on 10/01/24 for planned right heart cath and planned hospital admission to optimize for LVAD implantation. Patient underwent impella 5.5 implantation on 12/23 by Dr. Con Clunes. Explant and LVAD implantation of took place on 12/31 by Dr. Lucas of which he received 4 FFP, 2 platelets, cell saver 788 cc with EBL 1250 CC. Total bypass time 1:56. Complete TEE assessment was performed by Dr. Stoltzfus. This showed a dilated LV with EF < 20%. There was no AI, trace MR, trivial TR, fair RV systolic function and no PFO.  Patient was transferred to ICU on vasoactives, volume optimization and RHC.  Postoperatively he required milrinone  and epi postop for RV support and was diuresed and able to wean off inotropes and pressors.  Postop course complicated by A-fib, requiring TEE/DCCV.  He converted back to normal sinus rhythm and was maintained on amiodarone .  He developed ICU delirium which ultimately improved after discontinuation of trazodone . For left upper extremity weakness a head CT was completed and was negative for acute changes. Prior to admission the patient was active in the community independently and driving, he lives with his spouse in a one level home with 4 steps to  enter. Patient has been working with PT and OT and currently requires moderate assist with RW with Max A to organize and maintain attention to task. He is mod assist +2 for mobility and transfers. Therapy evaluations completed due to patient decreased functional mobility was admitted for a comprehensive rehab program.             Review of Systems  Constitutional: Negative.   HENT: Negative.    Eyes: Negative.   Respiratory:  Positive for cough.   Cardiovascular: Negative.   Gastrointestinal:        Constipated   Genitourinary: Negative.   Musculoskeletal:  Positive for back pain.  Skin: Negative.   Neurological:  Positive for dizziness and weakness.  Psychiatric/Behavioral: Negative.                   Past Medical History:  Diagnosis Date   CKD (chronic kidney disease) stage 2, GFR 60-89 ml/min     COPD (chronic obstructive pulmonary disease) (HCC)     Diabetes mellitus type II, non insulin  dependent (HCC)     Dilated aortic root     Elevated PSA     Hypercholesteremia     Hypertension     NICM (nonischemic cardiomyopathy) (HCC)     NSVT (nonsustained ventricular tachycardia) (HCC)     Obstructive sleep apnea     Paroxysmal atrial fibrillation (HCC)     Pulmonary hypertension (HCC)     PVC's (premature ventricular contractions)     Tobacco abuse  Past Surgical History:  Procedure Laterality Date   CARDIAC CATHETERIZATION   10/15/2000   COLONOSCOPY N/A 03/22/2014    Procedure: COLONOSCOPY;  Surgeon: Margo LITTIE Haddock, MD;  [facility]: [facility];  Service: Endoscopy;  Laterality: N/A;  11:15 AM   ICD IMPLANT N/A 08/21/2021    Procedure: ICD IMPLANT;  Surgeon: Waddell Danelle ORN, MD;  Location: [facility] INVASIVE CV LAB;  Service: Cardiovascular;  Laterality: N/A;   INGUINAL HERNIA REPAIR Right 06/24/2017    Procedure: HERNIA REPAIR INGUINAL ADULT WITH MESH;  Surgeon: Mavis Anes, MD;  Location: [facility] ORS;  Service: General;  Laterality: Right;   INSERTION OF  IMPLANTABLE LEFT VENTRICULAR ASSIST DEVICE N/A 10/14/2024    Procedure: INSERTION OF IMPLANTABLE LEFT VENTRICULAR ASSIST DEVICE HEARTMATE 3; REMOVAL OF IMPELLA 5.5;  Surgeon: Lucas Dorise POUR, MD;  Location: [facility] OR;  Service: Open Heart Surgery;  Laterality: N/A;   INTRAOPERATIVE TRANSESOPHAGEAL ECHOCARDIOGRAM N/A 10/06/2024    Procedure: ECHOCARDIOGRAM, TRANSESOPHAGEAL, INTRAOPERATIVE;  Surgeon: Daniel Con RAMAN, MD;  Location: [facility] OR;  Service: Open Heart Surgery;  Laterality: N/A;   INTRAOPERATIVE TRANSESOPHAGEAL ECHOCARDIOGRAM N/A 10/14/2024    Procedure: ECHOCARDIOGRAM, TRANSESOPHAGEAL, INTRAOPERATIVE;  Surgeon: Lucas Dorise POUR, MD;  Location: [facility] OR;  Service: Open Heart Surgery;  Laterality: N/A;   IR TUNNELED CENTRAL VENOUS CATH PLC W IMG   07/17/2024   PLACEMENT OF IMPELLA LEFT VENTRICULAR ASSIST DEVICE N/A 10/06/2024    Procedure: INSERTION, CARDIAC ASSIST DEVICE, IMPELLA 5.5;  Surgeon: Daniel Con RAMAN, MD;  Location: [facility] OR;  Service: Open Heart Surgery;  Laterality: N/A;   PROSTATE BIOPSY       RIGHT HEART CATH N/A 11/13/2022    Procedure: RIGHT HEART CATH;  Surgeon: Gardenia Led, DO;  [facility]: [facility] INVASIVE CV LAB;  Service: Cardiovascular;  Laterality: N/A;   RIGHT HEART CATH N/A 12/21/2022    Procedure: RIGHT HEART CATH;  Surgeon: Gardenia Led, DO;  [facility] INVAS[facility] CV LAB;  Service: Cardiovascular;  Laterality: N/A;   RIGHT HEART CATH N/A 11/25/2023    Procedure: RIGHT HEART CATH;  Surgeon: Gardenia Led, DO;  [facility] INVAS[facility] CV LAB;  Service: Cardiovascular;  Laterality: N/A;   RIGHT HEART CATH N/A 10/01/2024    Procedure: RIGHT HEART CATH;  Surgeon: Rolan Ezra RAMAN, MD;  Location: [facility] INVASIVE CV LAB;  Service: Cardiovascular;  Laterality: N/A;   RIGHT HEART CATH N/A 10/07/2024    Procedure: RIGHT HEART CATH;  Surgeon: Cherrie Toribio SAUNDERS, MD;  [facility]: [facility] INVASIVE CV LAB;  Service: Cardiovascular;  Laterality: N/A;   RIGHT/LEFT HEART CATH AND CORONARY ANGIOGRAPHY  N/A 07/19/2021    Procedure: RIGHT/LEFT HEART CATH AND CORONARY ANGIOGRAPHY;  Surgeon: Verlin Lonni BIRCH, MD;  Location: [facility] INVASIVE CV LAB;  Service: Cardiovascular;  Laterality: N/A;   TEE WITHOUT CARDIOVERSION N/A 12/21/2022    Procedure: TRANSESOPHAGEAL ECHOCARDIOGRAM (TEE);  Surgeon: Gardenia Led, DO;  Location[facility] ENDOSCOPY;  Service: Cardiovascular;  Laterality: N/A;             Family History  Problem Relation Age of Onset   Coronary artery disease Mother     Diabetes Father     Heart attack Father     Hypertension Father     Iron deficiency Father     Thyroid  disease Sister     Cardiomyopathy Brother          No significant coronary disease by coronary angiography   Heart disease Maternal Grandfather          Also paternal  grandfather   Sudden death Other     Colon cancer Neg Hx          Social History:  reports that he quit smoking about 3 years ago. His smoking use included cigarettes. He started smoking about 42 years ago. He has a 19.4 pack-year smoking history. He has never used smokeless tobacco. He reports that he does not drink alcohol and does not use drugs. Allergies: [Allergies]  [Allergies] No Known Allergies       Medications Prior to Admission  Medication Sig Dispense Refill   acetaminophen  (TYLENOL ) 500 MG tablet Take 1,000 mg by mouth every 6 (six) hours as needed for moderate pain.       albuterol  (VENTOLIN  HFA) 108 (90 Base) MCG/ACT inhaler INHALE 2 PUFFS BY MOUTH FOUR TIMES DAILY AS NEEDED (Patient taking differently: Inhale 1 puff into the lungs every 6 (six) hours as needed for shortness of breath.) 6.7 g 1   amiodarone  (PACERONE ) 200 MG tablet Take 1 tablet (200 mg total) by mouth daily. 30 tablet 5   apixaban  (ELIQUIS ) 5 MG TABS tablet Take 1 tablet (5 mg total) by mouth 2 (two) times daily. 60 tablet 11   atorvastatin  (LIPITOR) 40 MG tablet Take 1 tablet (40 mg total) by mouth daily. 90 tablet 3   digoxin  (LANOXIN ) 0.125 MG tablet Take  1 tablet (0.125 mg total) by mouth daily. 30 tablet 5   FARXIGA  10 MG TABS tablet TAKE 1 TABLET(10 MG) BY MOUTH DAILY BEFORE BREAKFAST 90 tablet 3   hydrALAZINE  (APRESOLINE ) 50 MG tablet Take 1 tablet (50 mg total) by mouth every 8 (eight) hours. 90 tablet 5   metFORMIN (GLUCOPHAGE) 500 MG tablet TAKE 1 TABLET BY MOUTH TWICE DAILY 180 tablet 1   mexiletine (MEXITIL ) 200 MG capsule Take 1 capsule (200 mg total) by mouth every 8 (eight) hours. 90 capsule 5   milrinone  (PRIMACOR ) 20 MG/100 ML SOLN infusion Inject 0.0294 mg/min into the vein continuous.       ondansetron  (ZOFRAN ) 4 MG tablet Take 1 tablet (4 mg total) by mouth every 8 (eight) hours as needed for nausea or vomiting. 20 tablet 3   potassium chloride  SA (KLOR-CON  M) 20 MEQ tablet Take 1 tablet (20 mEq total) by mouth daily. 30 tablet 3   sacubitril -valsartan  (ENTRESTO ) 24-26 MG Take 1 tablet by mouth 2 (two) times daily. 90 tablet 2   spironolactone  (ALDACTONE ) 25 MG tablet Take 1 tablet (25 mg total) by mouth at bedtime. 90 tablet 3   torsemide  (DEMADEX ) 20 MG tablet Take 1 tablet (20 mg total) by mouth daily. 30 tablet 3              Home: Home Living Family/patient expects to be discharged to:: Private residence Living Arrangements: Spouse/significant other Available Help at Discharge: Family (potentially can arrange 24/7, wife works second shift) Type of Home: House Home Access: Stairs to enter [employer] of Steps: 4 (in back (easiest access to home per pt), 6 at side) Entrance Stairs-Rails: Can reach both (on x4 stairs in back) Home Layout: One level Bathroom Shower/Tub: Engineer, Manufacturing Systems: Standard Bathroom Accessibility: Yes Home Equipment: Hand held shower head  Lives With: Spouse   Functional History: Prior Function Prior Level of Function : Independent/Modified Independent, Driving, Working/employed Mobility Comments: No AD ADLs Comments: Works as a merchandiser, 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 [employer] Status:  Mobility: Bed Mobility Overal bed mobility: Needs Assistance Bed Mobility: Supine  to Sit, Sit to Supine Rolling: Min assist Sidelying to sit: Min assist Supine to sit: Min assist Sit to supine: Min assist Sit to sidelying: Mod assist, +2 for physical assistance, HOB elevated General bed mobility comments: pt able to come to EoB with HOB elevated and cues to not pull up, min A for trunk elevation Transfers Overall transfer level: Needs assistance Equipment used: Rolling walker (2 wheels) Transfers: Sit to/from Stand Sit to Stand: Mod assist, From elevated surface Bed to/from chair/wheelchair/BSC transfer type:: Step pivot Step pivot transfers: +2 physical assistance, Min assist General transfer comment: Pt able to scoot to EOB with mod A, HIGHLY elevated surface, and mod A with rocking momentum to get him on his feet. He required use of gait belt and max A to stand from 3 in1 set to high level over the toilet. Due to his height and body mass his bottom gets stuck in the 3 in 1 Ambulation/Gait Ambulation/Gait assistance: Min assist, +2 safety/equipment Gait Distance (Feet): 400 Feet Assistive device: Rolling walker (2 wheels) Gait Pattern/deviations: Decreased stride length, Step-through pattern General Gait Details: pt continues to benefit from RW to keep upright posture Gait velocity: reduced Gait velocity interpretation: <1.8 ft/sec, indicate of risk for recurrent falls Pre-gait activities: Static march with BIL UEs on RW.   ADL: ADL Overall ADL's : Needs assistance/impaired Eating/Feeding: Set up Eating/Feeding Details (indicate cue type and reason): assist for opening containers, seasoning packets etc - Pt attempted but ultimately required assist Grooming: Set up, Sitting, Wash/dry hands Grooming Details (indicate cue type and reason): dependent on BUE for standing balance - requires seated position for safety Upper Body  Bathing: Minimal assistance, Sitting Lower Body Bathing: Maximal assistance, Sit to/from stand Upper Body Dressing : Moderate assistance, Sitting Upper Body Dressing Details (indicate cue type and reason): able to place harness over neck Lower Body Dressing: Maximal assistance Lower Body Dressing Details (indicate cue type and reason): socks Toilet Transfer: Moderate assistance, Rolling walker (2 wheels), Ambulation Toilet Transfer Details (indicate cue type and reason): mod A to boost into standing even from elevated bed Toileting- Clothing Manipulation and Hygiene: Maximal assistance, Sit to/from stand Toileting - Clothing Manipulation Details (indicate cue type and reason): rear peri care, Pt educated on compensatory strategies Functional mobility during ADLs: Moderate assistance, Rolling walker (2 wheels) General ADL Comments: working on power switch fom cord ot battery. Requires MAX A to organzie, sequence and maintain attention to task; poor error recognition   Cognition: Cognition Orientation Level: Oriented X4 Cognition Arousal: Alert Behavior During Therapy: [facility] for tasks assessed/performed   Physical Exam: Blood pressure 94/79, pulse 67, temperature 98.7 F (37.1 C), temperature source Oral, resp. rate 20, height 6' 6 (1.981 m), weight 91.1 kg, SpO2 94%. Physical Exam Constitutional:      General: He is not in acute distress. HENT:     Head: Normocephalic.     Right Ear: External ear normal.     Left Ear: External ear normal.     Mouth/Throat:     Mouth: Mucous membranes are moist.  Eyes:     Conjunctiva/sclera: Conjunctivae normal.  Cardiovascular:     Comments: LVAD hum Pulmonary:     Effort: Pulmonary effort is normal. No respiratory distress.     Breath sounds: No wheezing or rales.  Abdominal:     General: Bowel sounds are normal. There is no distension.     Tenderness: There is no abdominal tenderness.  Musculoskeletal:        General:  No swelling. Normal  range of motion.     Cervical back: Normal range of motion.  Skin:    General: Skin is warm.     Comments: Sacral pressure wound  Neurological:     General: No focal deficit present.     Mental Status: He is alert.     Cranial Nerves: No cranial nerve deficit.     Motor: No weakness.  Psychiatric:        Mood and Affect: Mood normal.       Lab Results Last 48 Hours        Results for orders placed or performed during the hospital encounter of 10/01/24 (from the past 48 hours)  Glucose, capillary     Status: Abnormal    Collection Time: 10/31/24  4:31 PM  Result Value Ref Range    Glucose-Capillary 109 (H) 70 - 99 mg/dL      Comment: Glucose reference range applies only to samples taken after fasting for at least 8 hours.  Glucose, capillary     Status: Abnormal    Collection Time: 10/31/24  9:12 PM  Result Value Ref Range    Glucose-Capillary 127 (H) 70 - 99 mg/dL      Comment: Glucose reference range applies only to samples taken after fasting for at least 8 hours.  Lactate dehydrogenase     Status: Abnormal    Collection Time: 11/01/24  5:00 AM  Result Value Ref Range    LDH 260 (H) 105 - 235 U/L      Comment: Performed at [facility] Lab, [address], [place], [place] [address]  Protime-INR     Status: Abnormal    Collection Time: 11/01/24  5:00 AM  Result Value Ref Range    Prothrombin Time 19.7 (H) 11.4 - 15.2 seconds    INR 1.6 (H) 0.8 - 1.2      Comment: (NOTE) INR goal varies based on device and disease states. Performed at [facility] Lab, [address] [place], [place] [address]    Cooxemetry Panel (carboxy, met, total hgb, O2 sat)     Status: Abnormal    Collection Time: 11/01/24  5:00 AM  Result Value Ref Range    Total hemoglobin 11.2 (L) 12.0 - 16.0 g/dL    O2 Saturation 55.3 %    Carboxyhemoglobin 2.2 (H) 0.5 - 1.5 %    Methemoglobin <0.7 0.0 - 1.5 %      Comment: Performed at [facility] Lab, [address], [place], [place] [address]   Renal function panel     Status: Abnormal    Collection Time: 11/01/24  5:00 AM  Result Value Ref Range    Sodium 135 135 - 145 mmol/L    Potassium 4.6 3.5 - 5.1 mmol/L    Chloride 99 98 - 111 mmol/L    CO2 25 22 - 32 mmol/L    Glucose, Bld 113 (H) 70 - 99 mg/dL      Comment: Glucose reference range applies only to samples taken after fasting for at least 8 hours.    BUN 21 8 - 23 mg/dL    Creatinine, Ser 8.87 0.61 - 1.24 mg/dL    Calcium  8.5 (L) 8.9 - 10.3 mg/dL    Phosphorus 3.0 2.5 - 4.6 mg/dL    Albumin  3.2 (L) 3.5 - 5.0 g/dL    GFR, Estimated >39 >39 mL/min      Comment: (NOTE) Calculated using the CKD-EPI Creatinine Equation (  2021)      Anion gap 11 5 - 15      Comment: Performed at [facility], [address] [place], [place] [address]  CBC     Status: Abnormal    Collection Time: 11/01/24  5:00 AM  Result Value Ref Range    WBC 9.4 4.0 - 10.5 K/uL    RBC 3.53 (L) 4.22 - 5.81 MIL/uL    Hemoglobin 11.0 (L) 13.0 - 17.0 g/dL    HCT 65.9 (L) 60.9 - 52.0 %    MCV 96.3 80.0 - 100.0 fL    MCH 31.2 26.0 - 34.0 pg    MCHC 32.4 30.0 - 36.0 g/dL    RDW 81.5 (H) 88.4 - 15.5 %    Platelets 249 150 - 400 K/uL    nRBC 0.0 0.0 - 0.2 %      Comment: Performed at [facility] Lab, [address], [place], [place] [address]  Glucose, capillary     Status: None    Collection Time: 11/01/24  6:13 AM  Result Value Ref Range    Glucose-Capillary 93 70 - 99 mg/dL      Comment: Glucose reference range applies only to samples taken after fasting for at least 8 hours.  Glucose, capillary     Status: Abnormal    Collection Time: 11/01/24 10:54 AM  Result Value Ref Range    Glucose-Capillary 111 (H) 70 - 99 mg/dL      Comment: Glucose reference range applies only to samples taken after fasting for at least 8 hours.  Glucose, capillary     Status: Abnormal    Collection Time: 11/01/24  3:05 PM  Result Value Ref Range    Glucose-Capillary 142 (H) 70 - 99 mg/dL      Comment:  Glucose reference range applies only to samples taken after fasting for at least 8 hours.  Cooxemetry Panel (carboxy, met, total hgb, O2 sat)     Status: Abnormal    Collection Time: 11/01/24  3:13 PM  Result Value Ref Range    Total hemoglobin 10.6 (L) 12.0 - 16.0 g/dL    O2 Saturation 31.5 %    Carboxyhemoglobin 1.5 0.5 - 1.5 %    Methemoglobin <0.7 0.0 - 1.5 %      Comment: Performed at [facility], [address] [place], [place] [address]  Glucose, capillary     Status: Abnormal    Collection Time: 11/01/24  9:04 PM  Result Value Ref Range    Glucose-Capillary 122 (H) 70 - 99 mg/dL      Comment: Glucose reference range applies only to samples taken after fasting for at least 8 hours.  Glucose, capillary     Status: Abnormal    Collection Time: 11/02/24  5:48 AM  Result Value Ref Range    Glucose-Capillary 102 (H) 70 - 99 mg/dL      Comment: Glucose reference range applies only to samples taken after fasting for at least 8 hours.  Lactate dehydrogenase     Status: None    Collection Time: 11/02/24  5:53 AM  Result Value Ref Range    LDH 218 105 - 235 U/L      Comment: Performed at [facility] Lab, [address], [place], [place] [address]  Protime-INR     Status: Abnormal    Collection Time: 11/02/24  5:53 AM  Result Value Ref Range    Prothrombin Time 20.6 (H) 11.4 -  15.2 seconds    INR 1.7 (H) 0.8 - 1.2      Comment: (NOTE) INR goal varies based on device and disease states. Performed at [facility] Lab, [address], [place], [place] [address]    CBC     Status: Abnormal    Collection Time: 11/02/24  5:53 AM  Result Value Ref Range    WBC 8.6 4.0 - 10.5 K/uL    RBC 3.43 (L) 4.22 - 5.81 MIL/uL    Hemoglobin 10.7 (L) 13.0 - 17.0 g/dL    HCT 66.9 (L) 60.9 - 52.0 %    MCV 96.2 80.0 - 100.0 fL    MCH 31.2 26.0 - 34.0 pg    MCHC 32.4 30.0 - 36.0 g/dL    RDW 81.9 (H) 88.4 - 15.5 %    Platelets 202 150 - 400 K/uL    nRBC 0.0 0.0 - 0.2 %      Comment:  Performed at [facility] Lab, [address], [place], [place] [address]  Renal function panel     Status: Abnormal    Collection Time: 11/02/24  5:54 AM  Result Value Ref Range    Sodium 131 (L) 135 - 145 mmol/L    Potassium 4.2 3.5 - 5.1 mmol/L    Chloride 98 98 - 111 mmol/L    CO2 26 22 - 32 mmol/L    Glucose, Bld 110 (H) 70 - 99 mg/dL      Comment: Glucose reference range applies only to samples taken after fasting for at least 8 hours.    BUN 22 8 - 23 mg/dL    Creatinine, Ser 8.91 0.61 - 1.24 mg/dL    Calcium  8.5 (L) 8.9 - 10.3 mg/dL    Phosphorus 2.7 2.5 - 4.6 mg/dL    Albumin  3.1 (L) 3.5 - 5.0 g/dL    GFR, Estimated >39 >39 mL/min      Comment: (NOTE) Calculated using the CKD-EPI Creatinine Equation (2021)      Anion gap 8 5 - 15      Comment: Performed at [facility] Lab, [address] [place], [place] [address]  Glucose, capillary     Status: Abnormal    Collection Time: 11/02/24 11:02 AM  Result Value Ref Range    Glucose-Capillary 145 (H) 70 - 99 mg/dL      Comment: Glucose reference range applies only to samples taken after fasting for at least 8 hours.      Imaging Results (Last 48 hours)  No results found.         Blood pressure 94/79, pulse 67, temperature 98.7 F (37.1 C), temperature source Oral, resp. rate 20, height 6' 6 (1.981 m), weight 91.1 kg, SpO2 94%.   Medical Problem List and Plan: 1. Functional deficits secondary to debility related CHF/ LVAD placement             -patient may  shower             -ELOS/Goals: 5-7 days, mod I 2.  Antithrombotics: -DVT/anticoagulation:  Mechanical:  Antiembolism stockings, knee (TED hose) Bilateral lower extremities              -antiplatelet therapy: Was on apixaban  PTA for hx Afib > now with LVAD on warfarin  INR Goal: 2.0 - 2.5  coumadin  dosing per pharmacy.  3. Pain Management: Gabapentin  200 mg at bedtime. PRN: tylenol  and oxycodone   4. Mood/Behavior/Sleep: LCSW to follow for evaluation and support  when available.              -antipsychotic agents: n/a 5. Neuropsych/cognition: This patient is capable of making decisions on his own behalf. 6. Skin/Wound Care: Routine pressure relief measures. Continue sternal precautions.  Drive Line: Continue MWF dressing changes by VAD probation officer. Next dressing change due 11/04/24 by VAD coordinator or nurse champion. -Sacral wound: Continue Santyl  oint apply to yellow portion of sacral wound daily. Pressure relief, encourage appropriate nutrition   7. Fluids/Electrolytes/Nutrition: Monitor strict I&O and daily weight.              -GERD: Protonix               -Ensure and vitamin supplements    8. Acute on chronic systolic CHF, NYHA IV: S/p HM III LVAD implant by Dr. Lucas 10/14/24. Advanced HF team following.              - Continue Lasix  40 mg daily with potassium 40 mEq, Sildenafil  20 mg TID for RV support. Digoxin  0.0625 mg daily. Dig level 0.9 1/13. Spironolactone  50 mg bid. Warfarin per pharmacy dosing.              -daily weights 9. Pulmonary hypertension: Severe combined pre/post capillary PH on RHC-resolved    10. AKI on CKD stage 3: Scr stable, follow BMP in AM.    11. PVCs/NSVT: Improved. Has Biotronik ICD.              -Continue mexiletine 150 mg bid.    12. Persistent Atrial fibrillation: s/p DCCV 1/12, currently NSR.              -Continue amiodarone  200 mg bid and warfarin.    13. ABLA: Hgb 10.7, Monitor daily INR, CBC, and for s/sx of bleeding.    14. Hypokalemia/Hypomagnesemia: On potassium 40 mEq, continue supplementation PRN.   15. T2DM: A1c 6.0-- Holding home Metformin and Farxiga . Insulin  reqs significantly decreased since PO intake.  -Continue monitoring glucose ac/hs with SSI as needed.    16. Constipation: Small recorded on 1/19, pt still endrosing constipation.              -Miralax  now BID, Senokot-S at bedtime.  -give 60 cc sorbitol  tonight.  -PRN senokot and sorbitol .    17. Prostate cancer:  s/p radiation.    18. Meningioma: Monitoring.            James LOISE Satterfield, NP 11/02/2024   I have personally performed a face to face diagnostic evaluation of this patient and formulated the key components of the plan.  Additionally, I have personally reviewed laboratory data, imaging studies, as well as relevant notes and concur with the physician assistant's documentation above.  The patient's status has not changed from the original H&P.  Any changes in documentation from the acute care chart have been noted above.  James IVAR Gunther, MD, [place]LL[place]

## 2024-11-02 NOTE — TOC Progression Note (Signed)
 Transition of Care [facility]) - Progression Note    Patient Details  Name: James [name] MRN: [id number] Date of Birth: 1961/08/15  Transition of Care [facility]) CM/SW Contact  [name] Phone Number: [phone] 11/02/2024, 10:00 AM  Clinical Narrative:   Per chart review patient is not medically ready. CIR Insurance authorization was received. Awaiting bed availability.   HF CSW/CM will continue to follow and monitor for dc readiness.     Expected Discharge Plan: IP Rehab Facility Barriers to Discharge: Continued Medical Work up               Expected Discharge Plan and Services   Discharge Planning Services: CM Consult Post Acute Care Choice: Home Health Living arrangements for the past 2 months: Single Family Home Expected Discharge Date: 10/25/24                                     Social Drivers of Health (SDOH) Interventions SDOH Screenings   Food Insecurity: No Food Insecurity (10/06/2024)  Housing: Low Risk (10/06/2024)  Transportation Needs: No Transportation Needs (10/06/2024)  Utilities: Not At Risk (10/06/2024)  Alcohol Screen: Low Risk (11/18/2023)  Depression (PHQ2-9): Low Risk (08/21/2024)  Tobacco Use: Medium Risk (10/28/2024)    Readmission Risk Interventions    07/10/2024   11:05 AM 10/10/2023   10:37 AM  Readmission Risk Prevention Plan  Transportation Screening Complete Complete  Home Care Screening Complete Complete  Medication Review (RN CM) Complete Complete

## 2024-11-02 NOTE — Progress Notes (Signed)
 IP rehab admissions - Bed available on inpatient rehab and patient medically stable.  Will admit to CIR today.  [phone]

## 2024-11-02 NOTE — Progress Notes (Signed)
 LVAD Coordinator Rounding Note:  Admitted 10/01/24 for optimization prior to LVAD implant. Impella 5.5 placed 10/06/24.  HM 3 LVAD implanted on 10/14/24 by Dr Lucas under destination therapy criteria.  12/18: Milrinone  titrated from 0.25 to 0.5 mcg/kg/min post RHC d/t low-output and pulmonary hypertension 12/19: NE added 12/22: Markedly elevated PA pressures, elevated PCWP and elevated SVR. Titrated off NE. Added DBA.  12/23: Impella 5.5 Implanted.  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 Heparin  > Bival.  12/31: HM III LVAD implant 1/5: LVAD speed increased to 5400  1/6: LUE weakness, head CT negative  1/9: Ramp echo, speed increased to 5500 rpm 1/11: Off milrinone . 1/12: bedside TEE/DCCV. EF 20%, mod/sev HK. VAD speed decreased to 5400 14: transferred out of CCU  1/15: repeat head CT negative for acute changes.  Pt lying in bed this morning. Pt is very constipated. He tells me that he has had an enema and drank miralax  today with no success. At the end of my visit pt states I'm going. I ask pt what he meant he tells me that he is pooping. I ask pt not to poop in the bed. He states I have a pouch around my bottom. Nurse confirms he does not. Pt continues to say things that do not make sense and he needs constant reinforcement.   Discharge education completed 10/28/24. Ongoing driveline dressing change teaching with pt's wife. Unable to reach the wife today so that VAD coordinator could watch perform driveline dressing change.  Planning for pt to go to CIR today. We will order home VAD equipment today.  Vital signs: Temp: 98.7 HR: 76 Doppler Pressure: 98 Automatic BP: 94/79 (86) O2 Sat: 94% on RA Wt: 235.4>244.3>243.4>234.8>223.5>239.6>220.2>224.2>214>210.9>214>200.8 lb    LVAD interrogation reveals:  Speed: 5400 Flow: 5.2 Power: 3.8 w PI: 3.1   Alarms: none Events: 1 yest; none today Hematocrit: 33  Fixed speed: 5400 Low speed limit: 5100   Drive Line: Existing VAD dressing  removed and site care performed using sterile technique by VAD coordinator. Drive line exit site cleaned with Chlora prep applicators x 2, allowed to dry, and gauze dressing with Silverlon patch applied. Exit site unincorporated, the velour is fully implanted at exit site. Moderate amount of serosanguinous drainage present on previous dressing. No redness, tenderness, foul odor or rash noted  Cath grip anchor replaced. Continue MWF dressing changes by VAD coordinator or nurse champion. Next dressing change due 11/04/24 by VAD coordinator or nurse champion.      Labs:  LDH trend: [phone]  INR trend: 1.3>1.4>1.4>1.6>1.7>1.7>2.0>2.1>2.2>2.3>2.2>1.8>1.7  Anticoagulation Plan: - INR Goal: 2.0 - 2.5 - Coumadin  dosing per pharmacy  Blood Products:  Intra-op 10/14/24:   4 FFP  2 platelets  700 cellsaver  DDAVP  x 2 Post-op:  10/15/24: 1 PRBC  Device: - Biotronik -Therapies: ON 10/29/24  Arrythmias: cardioverted from afib on 10/26/24  Respiratory: extubated 10/15/24  Infection:   Drips:  Milrinone  0.125 mcg/kg/min-off Amiodarone  30 mg/hr-off  Adverse Events on VAD:  Patient Education:  Completed 10/28/24. See separate note for details Ongoing dressing change teaching with pt's wife [name]  Plan/Recommendations:  1. Page VAD coordinator for any equipment or drive line concerns 2. Continue Monday, Wednesday, Friday drive line dressing changes by VAD coordinator or nurse champion 3. Ok to d/c to CIR  Lauraine Ip RN VAD Coordinator  Office: [phone]  24/7 Pager: [phone]

## 2024-11-02 NOTE — Progress Notes (Signed)
 PMR Admission Coordinator Pre-Admission Assessment   Patient: James [name] is an 64 y.o., male MRN: [id number] DOB: Mar 20, 1961 Height: 6' 6 (198.1 cm) Weight: 91.1 kg   Insurance Information HMO:     PPO: yes     PCP: IPA:      80/20:      OTHER:  PRIMARY: BCBS Commercial PPO      Policy#: [id number]       Subscriber: patient CM Name: James [name]      Phone#: [phone]     Fax#: [phone] Pre-Cert#: [id number]  Received insurance authorization via fax from [place] with [employer] on 10/31/24. Pt approved for 7 days from 10/31/24-11/07/24. Updates due on 11/05/24.    Employer:  Benefits:  Phone #: [phone] (verified on Blue-E)     Name:  Eff. Date: 07/15/18-10/14/9998     Deduct: $1,500 ($0 met)      Out of Pocket Max: $6,600 ($0 met)      Life Max: NA CIR: 80% coverage, 20% co-insurance      SNF: no coverage Outpatient: 80% coverage     Co-Pay: 20% co-insurance Home Health: 80% coverage      Co-Pay: 20% co-insurance DME: 80% coverage     Co-Pay: 20% co-insurance Providers: pt's choice SECONDARY:       Policy#:      Phone#:    Artist:       Phone#:    The Best Boy for patients in Inpatient Rehabilitation Facilities with attached Privacy Act Statement-Health Care Records was provided and verbally reviewed with: Patient   Emergency Contact Information Contact Information       Name Relation Home Work [place] Spouse [phone]         Other Contacts       Name Relation Home Work Mobile    [name],James Daughter [phone]   [phone]           Current Medical History  Patient Admitting Diagnosis: HF, new LVAD  History of Present Illness: James [name] is a 64 y.o. male with HFrEF/nonischemic cardiomyopathy s/p ICD, HTN, T2DM, VT, aortic root aneurysm, prostate cancer s/p EBRT and ADT, meningioma. HF da Completed radiation for prostate cancer April 2025. He was also referred to  Neurology for mass noted on CT of head. Echo 7/25 showed EF 25-30%, G3DD, RV mildly reduced, moderate MR.  Initial plan was to follow-up with [facility] for transplant. After discussion with family, patient decided he would prefer to consider LVAD. He was being admitted for pre-VAD optimization prior to planned surgery. He admitted to [facility] 10/01/24.  On 10/06/24, being admitted for pre-VAD optimization prior to planned surgery on 10/06/24. On 10/14/24 HM III LVAD implant. Pt. Was seen by PT/OT and they recommend CIR to assist return to PLOF.     Patient's medical record from [facility] has been reviewed by the rehabilitation admission coordinator and physician.   Past Medical History      Past Medical History:  Diagnosis Date   CKD (chronic kidney disease) stage 2, GFR 60-89 ml/min     COPD (chronic obstructive pulmonary disease) (HCC)     Diabetes mellitus type II, non insulin  dependent (HCC)     Dilated aortic root     Elevated PSA     Hypercholesteremia     Hypertension     NICM (nonischemic cardiomyopathy) (HCC)     NSVT (nonsustained ventricular tachycardia) (HCC)  Obstructive sleep apnea     Paroxysmal atrial fibrillation (HCC)     Pulmonary hypertension (HCC)     PVC's (premature ventricular contractions)     Tobacco abuse            Has the patient had major surgery during 100 days prior to admission? Yes   Family History   family history includes Cardiomyopathy in his brother; Coronary artery disease in his mother; Diabetes in his father; Heart attack in his father; Heart disease in his maternal grandfather; Hypertension in his father; Iron deficiency in his father; Sudden death in an other family member; Thyroid  disease in his sister.   Current Medications [Current Medications]  [Current Medications]   Current Facility-Administered Medications:    amiodarone  (PACERONE ) tablet 200 mg, 200 mg, Oral, BID, Lee, Jordan, NP, 200 mg at  11/02/24 9050   Chlorhexidine  Gluconate Cloth 2 % PADS 6 each, 6 each, Topical, Daily, Zenaida Morene PARAS, MD, 6 each at 11/02/24 9050   collagenase  (SANTYL ) ointment, , Topical, Daily, Bensimhon, Toribio SAUNDERS, MD, Given at 11/02/24 0949   digoxin  (LANOXIN ) tablet 0.0625 mg, 0.0625 mg, Oral, Daily, McLean, Dalton S, MD, 0.0625 mg at 11/02/24 9051   furosemide  (LASIX ) tablet 40 mg, 40 mg, Oral, Daily, Lee, Jordan, NP, 40 mg at 11/02/24 0948   gabapentin  (NEURONTIN ) capsule 200 mg, 200 mg, Oral, QHS, Gretel Prentice BIRCH, RPH, 200 mg at 11/01/24 2118   guaiFENesin -dextromethorphan  (ROBITUSSIN DM) 100-10 MG/5ML syrup 5 mL, 5 mL, Oral, Q4H PRN, Zenaida Morene PARAS, MD, 5 mL at 10/24/24 1351   insulin  aspart (novoLOG ) injection 0-15 Units, 0-15 Units, Subcutaneous, TID WC, Kerrin Elspeth BROCKS, MD, 2 Units at 10/30/24 1649   insulin  aspart (novoLOG ) injection 0-5 Units, 0-5 Units, Subcutaneous, QHS, Hendrickson, Steven C, MD, 2 Units at 10/20/24 2134   lactose free nutrition (BOOST PLUS) liquid 237 mL, 237 mL, Oral, Daily, Bensimhon, Daniel R, MD   mexiletine (MEXITIL ) capsule 150 mg, 150 mg, Oral, Q12H, McLean, Dalton S, MD, 150 mg at 11/02/24 9050   multivitamin with minerals tablet 1 tablet, 1 tablet, Oral, Daily, Bensimhon, Daniel R, MD, 1 tablet at 11/02/24 9050   ondansetron  (ZOFRAN ) injection 4 mg, 4 mg, Intravenous, Q6H PRN, Bartle, Bryan K, MD, 4 mg at 10/16/24 1656   Oral care mouth rinse, 15 mL, Mouth Rinse, PRN, Zenaida Morene PARAS, MD   oxyCODONE  (Oxy IR/ROXICODONE ) immediate release tablet 5 mg, 5 mg, Oral, Q3H PRN, Marcine Catalan M, PA-C, 5 mg at 11/01/24 2351   pantoprazole  (PROTONIX ) EC tablet 40 mg, 40 mg, Oral, Daily, Bowser, Grace E, NP, 40 mg at 11/02/24 0949   phenol (CHLORASEPTIC) mouth spray 1 spray, 1 spray, Mouth/Throat, PRN, Zenaida Morene PARAS, MD, 1 spray at 10/27/24 0601   polyethylene glycol (MIRALAX  / GLYCOLAX ) packet 17 g, 17 g, Oral, Daily PRN, Zenaida Morene PARAS, MD, 17 g at  10/28/24 0003   polyethylene glycol (MIRALAX  / GLYCOLAX ) packet 17 g, 17 g, Oral, Daily, Lee, Jordan, NP, 17 g at 10/27/24 1031   polyethylene glycol (MIRALAX  / GLYCOLAX ) packet 17 g, 17 g, Oral, Daily, Bensimhon, Daniel R, MD, 17 g at 11/02/24 9051   potassium chloride  (KLOR-CON ) packet 40 mEq, 40 mEq, Oral, Daily, Bensimhon, Toribio SAUNDERS, MD, 40 mEq at 11/01/24 0914   senna (SENOKOT) tablet 17.2 mg, 2 tablet, Oral, QHS PRN, Zenaida Morene PARAS, MD, 17.2 mg at 10/28/24 0004   senna-docusate (Senokot-S) tablet 2 tablet, 2 tablet, Oral, QHS, Bensimhon, Toribio SAUNDERS, MD, 2  tablet at 11/01/24 2118   sildenafil  (REVATIO ) tablet 20 mg, 20 mg, Oral, TID, Stoner, Benjamin J, MD, 20 mg at 11/02/24 0949   sodium chloride  flush (NS) 0.9 % injection 10-40 mL, 10-40 mL, Intracatheter, Q12H, Zenaida Morene PARAS, MD, 10 mL at 11/01/24 2118   sorbitol , magnesium  hydroxide, mineral oil, glycerin (SMOG) enema, 960 mL, Rectal, Once, Colletta Manuelita Garre, PA-C   spironolactone  (ALDACTONE ) tablet 50 mg, 50 mg, Oral, BID, Lee, Jordan, NP, 50 mg at 11/02/24 9050   Warfarin - Pharmacist Dosing Inpatient, , Does not apply, q1600, Bitonti, Mannix T, [facility], Given at 10/31/24 1600   Facility-Administered Medications Ordered in Other Encounters:    sodium chloride  flush (NS) 0.9 % injection 3 mL, 3 mL, Intravenous, Q12H, Sabharwal, Aditya, DO   Patients Current Diet:  Diet Order                  Diet regular Room service appropriate? Yes; Fluid consistency: Thin  Diet effective now                         Precautions / Restrictions Precautions Precautions: Fall Precaution Booklet Issued: No Precaution/Restrictions Comments: LVAD Restrictions Weight Bearing Restrictions Per Provider Order: No RUE Weight Bearing Per Provider Order: Non weight bearing LUE Weight Bearing Per Provider Order: Non weight bearing Other Position/Activity Restrictions: sternal precautions    Has the patient had 2 or more falls or a fall with  injury in the past year? No   Prior Activity Level Community (5-7x/wk): Pt. active in the community PTA   Prior Functional Level Self Care: Did the patient need help bathing, dressing, using the toilet or eating? Independent   Indoor Mobility: Did the patient need assistance with walking from room to room (with or without device)? Independent   Stairs: Did the patient need assistance with internal or external stairs (with or without device)? Independent   Functional Cognition: Did the patient need help planning regular tasks such as shopping or remembering to take medications? Independent   Patient Information Are you of Hispanic, Latino/a,or Spanish origin?: A. No, not of Hispanic, Latino/a, or Spanish origin What is your race?: B. Black or African American Do you need or want an interpreter to communicate with a doctor or health care staff?: 0. No   Patient's Response To:  Health Literacy and Transportation Is the patient able to respond to health literacy and transportation needs?: Yes Health Literacy - How often do you need to have someone help you when you read instructions, pamphlets, or other written material from your doctor or pharmacy?: Never In the past 12 months, has lack of transportation kept you from medical appointments or from getting medications?: No In the past 12 months, has lack of transportation kept you from meetings, work, or from getting things needed for daily living?: No   Home Assistive Devices / Equipment Home Equipment: Hand held shower head   Prior Device Use: Indicate devices/aids used by the patient prior to current illness, exacerbation or injury? None of the above   Current Functional Level Cognition   Orientation Level: Oriented X4    Extremity Assessment (includes Sensation/Coordination)   Upper Extremity Assessment: RUE deficits/detail, LUE deficits/detail RUE Deficits / Details: decreased fine motor, sluggish strength overall 4-/5  grasp RUE Coordination: decreased fine motor LUE Deficits / Details: decreased fine motor generally 4-/5 LUE Coordination: decreased fine motor  Lower Extremity Assessment: Defer to PT evaluation     ADLs  Overall ADL's : Needs assistance/impaired Eating/Feeding: Set up Eating/Feeding Details (indicate cue type and reason): assist for opening containers, seasoning packets etc - Pt attempted but ultimately required assist Grooming: Set up, Sitting, Wash/dry hands Grooming Details (indicate cue type and reason): dependent on BUE for standing balance - requires seated position for safety Upper Body Bathing: Minimal assistance, Sitting Lower Body Bathing: Maximal assistance, Sit to/from stand Upper Body Dressing : Moderate assistance, Sitting Upper Body Dressing Details (indicate cue type and reason): able to place harness over neck Lower Body Dressing: Maximal assistance Lower Body Dressing Details (indicate cue type and reason): socks Toilet Transfer: Moderate assistance, Rolling walker (2 wheels), Ambulation Toilet Transfer Details (indicate cue type and reason): mod A to boost into standing even from elevated bed Toileting- Clothing Manipulation and Hygiene: Maximal assistance, Sit to/from stand Toileting - Clothing Manipulation Details (indicate cue type and reason): rear peri care, Pt educated on compensatory strategies Functional mobility during ADLs: Moderate assistance, Rolling walker (2 wheels) General ADL Comments: working on power switch fom cord ot battery. Requires MAX A to organzie, sequence and maintain attention to task; poor error recognition     Mobility   Overal bed mobility: Needs Assistance Bed Mobility: Supine to Sit, Sit to Supine Rolling: Min assist Sidelying to sit: Min assist Supine to sit: Min assist Sit to supine: Min assist Sit to sidelying: Mod assist, +2 for physical assistance, HOB elevated General bed mobility comments: pt able to come to EoB with HOB  elevated and cues to not pull up, min A for trunk elevation     Transfers   Overall transfer level: Needs assistance Equipment used: Rolling walker (2 wheels) Transfers: Sit to/from Stand Sit to Stand: Mod assist, From elevated surface Bed to/from chair/wheelchair/BSC transfer type:: Step pivot Step pivot transfers: +2 physical assistance, Min assist General transfer comment: Pt able to scoot to EOB with mod A, HIGHLY elevated surface, and mod A with rocking momentum to get him on his feet. He required use of gait belt and max A to stand from 3 in1 set to high level over the toilet. Due to his height and body mass his bottom gets stuck in the 3 in 1     Ambulation / Gait / Stairs / Wheelchair Mobility   Ambulation/Gait Ambulation/Gait assistance: Min assist, +2 safety/equipment Gait Distance (Feet): 400 Feet Assistive device: Rolling walker (2 wheels) Gait Pattern/deviations: Decreased stride length, Step-through pattern General Gait Details: pt continues to benefit from RW to keep upright posture Gait velocity: reduced Gait velocity interpretation: <1.8 ft/sec, indicate of risk for recurrent falls Pre-gait activities: Static march with BIL UEs on RW.     Posture / Balance Dynamic Sitting Balance Sitting balance - Comments: posterior bias Balance Overall balance assessment: Needs assistance Sitting-balance support: No upper extremity supported, Feet supported Sitting balance-Leahy Scale: Fair Sitting balance - Comments: posterior bias Postural control: Left lateral lean Standing balance support: Bilateral upper extremity supported Standing balance-Leahy Scale: Fair Standing balance comment: benefits from RW for stabilization and bringing weight forward instead of leaning back     Special considerations/life events  Skin Mid sternal chest incision with LVAD in place; coccyx pressure injury and Special service needs LVAD education ongoing    Previous Home Environment (from acute  therapy documentation) Living Arrangements: Spouse/significant other  Lives With: Spouse Available Help at Discharge: Family (potentially can arrange 24/7, wife works second shift) Type of Home: House Home Layout: One level Home Access: Stairs to enter Entrance Stairs-Rails: Can  reach both (on x4 stairs in back) Entrance Stairs-Number of Steps: 4 (in back (easiest access to home per pt), 6 at side) Bathroom Shower/Tub: Engineer, [employer] Systems: Standard Bathroom Accessibility: Yes How Accessible: Accessible via walker, Accessible via wheelchair Home Care Services: No   Discharge Living Setting Plans for Discharge Living Setting: Patient's home Type of Home at Discharge: House Discharge Home Layout: One level Discharge Home Access: Stairs to enter Entrance Stairs-Rails: Right Discharge Bathroom Shower/Tub: Tub/shower unit Discharge Bathroom Toilet: Standard Discharge Bathroom Accessibility: Yes How Accessible: Accessible via walker, Accessible via wheelchair Does the patient have any problems obtaining your medications?: No   Social/Family/Support Systems Patient Roles: Spouse Contact Information: [phone] Anticipated Caregiver: [name] Anticipated Caregiver's Contact Information: Wife works, adult daughter is CNA and can stay with pt while she works Ability/Limitations of Caregiver: none stated Caregiver Availability: 24/7 Discharge Plan Discussed with Primary Caregiver: Yes Is Caregiver In Agreement with Plan?: No Does Caregiver/Family have Issues with Lodging/Transportation while Pt is in Rehab?: No   Goals Patient/Family Goal for Rehab: PT/OT Mod I Expected length of stay: 5-7 days Pt/Family Agrees to Admission and willing to participate: Yes Program Orientation Provided & Reviewed with Pt/Caregiver Including Roles  & Responsibilities: Yes   Decrease burden of Care through IP rehab admission: not anticipated   Possible need for SNF placement upon discharge:  not anticipated   Patient Condition: I have reviewed medical records from [facility], spoken with CM, and patient and spouse. I met with patient at the bedside for inpatient rehabilitation assessment.  Patient will benefit from ongoing PT and OT, can actively participate in 3 hours of therapy a day 5 days of the week, and can make measurable gains during the admission.  Patient will also benefit from the coordinated team approach during an Inpatient Acute Rehabilitation admission.  The patient will receive intensive therapy as well as Rehabilitation physician, nursing, social worker, and care management interventions.  Due to safety, skin/wound care, disease management, medication administration, pain management, and patient education the patient requires 24 hour a day rehabilitation nursing.  The patient is currently min to mod assist with mobility and basic ADLs.  Discharge setting and therapy post discharge at home with home health is anticipated.  Patient has agreed to participate in the Acute Inpatient Rehabilitation Program and will admit today.   Preadmission Screen Completed By:  Leita KATHEE Kleine, 11/02/2024 10:46 AM ______________________________________________________________________   Discussed status with Dr. Babs on 11/02/24 at 0930 and received approval for admission today.   Admission Coordinator:  Leita KATHEE Kleine, CCC-SLP, and Lugene Ropes, RN at time 1048/Date 11/02/24    Assessment/Plan: Diagnosis: debility after HF,LVAD Does the need for close, 24 hr/day Medical supervision in concert with the patient's rehab needs make it unreasonable for this patient to be served in a less intensive setting? Yes Co-Morbidities requiring supervision/potential complications: HTN, DM, prostate cancer Due to bladder management, bowel management, safety, skin/wound care, disease management, medication administration, pain management, and patient education, does the patient require 24  hr/day rehab nursing? Yes Does the patient require coordinated care of a physician, rehab nurse, PT, OT, and SLP to address physical and functional deficits in the context of the above medical diagnosis(es)? Yes Addressing deficits in the following areas: balance, endurance, locomotion, strength, transferring, bowel/bladder control, bathing, dressing, feeding, grooming, toileting, and psychosocial support Can the patient actively participate in an intensive therapy program of at least 3 hrs of therapy 5 days a week? Yes The potential  for patient to make measurable gains while on inpatient rehab is excellent Anticipated functional outcomes upon discharge from inpatient rehab: modified independent PT, modified independent OT, n/a SLP Estimated rehab length of stay to reach the above functional goals is: 5-7 days Anticipated discharge destination: Home 10. Overall Rehab/Functional Prognosis: excellent     MD Signature: Arthea IVAR Gunther, MD, [facility] Physical Medicine & Rehabilitation Medical Director Rehabilitation Services 11/02/2024            Revision History  Date/Time User Provider Type Action  11/02/2024 11:43 AM Gunther Arthea DASEN, MD Physician Sign  11/02/2024 10:48 AM Duanne Lovett HERO, RN Rehab Admission Coordinator Share  11/02/2024 10:44 AM Duanne Lovett HERO, RN Rehab Admission Coordinator Share  11/01/2024  9:26 AM Yvone Delayne Tinnie SHAUNNA, CCC-SLP Rehab Admission Coordinator Share  10/30/2024  3:05 PM Yvone Delayne Tinnie SHAUNNA, CCC-SLP Rehab Admission Coordinator Share  10/20/2024  9:47 AM Cecilie, Leita KATHEE OVERMAN Rehab Admission Coordinator Share   View Details Report

## 2024-11-02 NOTE — TOC Transition Note (Signed)
 Transition of Care [facility]) - Discharge Note   Patient Details  Name: James [name] MRN: [id number] Date of Birth: Oct 14, 1961  [facility] CM/SW Contact:  Roxie KANDICE Stain, RN Phone Number: 11/02/2024, 12:10 PM   Clinical Narrative:     Patient stable to transfer to inpatient rehab. ICM/inpatient case management will sign off   Final next level of care: IP Rehab Facility Barriers to Discharge: Continued Medical Work up   Patient Goals and CMS Choice Patient states their goals for this hospitalization and ongoing recovery are:: wants to recover CMS Medicare.gov Compare Post Acute Care list provided to:: Patient Choice offered to / list presented to : Patient      Discharge Placement                 CIR      Discharge Plan and Services Additional resources added to the After Visit Summary for     Discharge Planning Services: CM Consult Post Acute Care Choice: Home Health                               Social Drivers of Health (SDOH) Interventions SDOH Screenings   Food Insecurity: No Food Insecurity (10/06/2024)  Housing: Low Risk (10/06/2024)  Transportation Needs: No Transportation Needs (10/06/2024)  Utilities: Not At Risk (10/06/2024)  Alcohol Screen: Low Risk (11/18/2023)  Depression (PHQ2-9): Low Risk (08/21/2024)  Tobacco Use: Medium Risk (10/28/2024)     Readmission Risk Interventions    07/10/2024   11:05 AM 10/10/2023   10:37 AM  Readmission Risk Prevention Plan  Transportation Screening Complete Complete  Home Care Screening Complete Complete  Medication Review (RN CM) Complete Complete

## 2024-11-02 NOTE — Plan of Care (Signed)
"        Wound Plan   Wounds present: Wound 11/02/24 1719 Pressure Injury Sacrum Medial Deep Tissue Pressure Injury - Purple or maroon localized area of discolored intact skin or blood-filled blister due to damage of underlying soft tissue from pressure and/or shear.   Interventions: WOC consult  11/02/2024 10:27 AM  Dietician consult Patient on regular diet eating 50% collagenase  (SANTYL ) ointment Topical,  Apply to yellow portion of sacral wound daily lactose free nutrition (BOOST PLUS) liquid 237 mL Daily multivitamin with minerals tablet 1 tablet Daily zinc  sulfate (50mg  elemental zinc ) capsule 220 mg Daily with supper 3 - Step skin care regimen: Cleaner, Moisturizer, Skin Protectant Routine, Daily And PRN incontinence of urine or stool Mattress Replacement Pressure redistribution chair pad if out of bed and has any type of wound or MASD on the sacrum/coccyx/buttocks, or if patient will be elevating the feet while in the chair.  Wound Care Standing order set for wound care Turn patient every 2 hours while in bed or assist with pressure relief every 15 min while in chair Wound care Sacral wound; Cleanse with NS, pat dry. Apply 1/4 thick layer of Santyl  to the yellow portion of the wound daily Apply Xeroform to wound bed and cover with Silicone foam dressing. Change daily.  Braden Score: 18  Sensory: 4  Moisture: 3  Activity: 3  Mobility: 3  Nutrition: 3  Friction: 2  Contributors: Gilford Sherrilyn NOVAK, RN Registered Nurse WOC Daphne LOISE [place], NP  Eulalio Falls BSN, RN-BC, WTA "

## 2024-11-02 NOTE — Progress Notes (Signed)
 Physical Medicine and Rehabilitation Consult Reason for Consult:debility Referring Physician: Bensimhon     HPI: James [name] is a 64 y.o. male with a history of prostate cancer, CKD stage III AA, history of PAF and V. tach who was admitted on 10/01/2024 for pre-VAD optimization.  Patient had a heart cath on 1218 and 1224 with Impella placement on 1223 and ultimately LVAD placement on 10/14/2024.  On 10/20/2024 patient developed left-sided weaknessBut CT of the head was negative for infarct.  Patient had a bedside TEE and DC cardioversion for persistent atrial fibrillation on 10/26/2024.  Ejection fraction noted at 20%.  Other issues include acute on chronic kidney disease, acute blood loss anemia, hypokalemia and hypomagnesia anemia.  Patient has been working with therapies and is at a min to mod assist level for sit to stand transfers and walked min assist to 400 feet using a rolling walker.  He has been mod to max assist with basic ADLs.  Patient lives at home with his spouse in a 1 level house with 4-6 steps to enter.  Prior to his admission he was independent and driving.       Home: Home Living Family/patient expects to be discharged to:: Private residence Living Arrangements: Spouse/significant other Available Help at Discharge: Family (potentially can arrange 24/7, wife works second shift) Type of Home: House Home Access: Stairs to enter [employer] of Steps: 4 (in back (easiest access to home per pt), 6 at side) Entrance Stairs-Rails: Can reach both (on x4 stairs in back) Home Layout: One level Bathroom Shower/Tub: Engineer, Manufacturing Systems: Standard Bathroom Accessibility: Yes Home Equipment: Hand held shower head  Lives With: Spouse  Functional History: Prior Function Prior Level of Function : Independent/Modified Independent, Driving, Working/employed Mobility Comments: No AD ADLs Comments: Works as a merchandiser, 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  [employer] Status:  Mobility: Bed Mobility Overal bed mobility: Needs Assistance Bed Mobility: Supine to Sit, Sit to Supine Rolling: Min assist Sidelying to sit: Min assist Supine to sit: Supervision Sit to supine: Min assist Sit to sidelying: Mod assist, +2 for physical assistance, HOB elevated General bed mobility comments: pt able to come to EoB with HOB elevated and cues to not pull up, min A for returning LE to bed at end of session Transfers Overall transfer level: Needs assistance Equipment used: Rolling walker (2 wheels) Transfers: Sit to/from Stand Sit to Stand: Min assist, Mod assist, +2 physical assistance, From elevated surface Bed to/from chair/wheelchair/BSC transfer type:: Step pivot Step pivot transfers: +2 physical assistance, Min assist General transfer comment: pt continues to have sacral pain prehibiting him from scooting to EoB, and then requiring modAx2 for coming to upright, discussed needing to make sure he is closer to [facility] when coming to sitting, that way he will be able to bend his legs for power up to standing Ambulation/Gait Ambulation/Gait assistance: Min assist, +2 safety/equipment Gait Distance (Feet): 400 Feet Assistive device: Rolling walker (2 wheels) Gait Pattern/deviations: Decreased stride length, Step-through pattern General Gait Details: pt continues to benefit from RW to keep upright posture Gait velocity: reduced Gait velocity interpretation: <1.8 ft/sec, indicate of risk for recurrent falls Pre-gait activities: Static march with BIL UEs on RW.   ADL: ADL Overall ADL's : Needs assistance/impaired Eating/Feeding: Set up Grooming: Supervision/safety, Set up Grooming Details (indicate cue type and reason): EOB Upper Body Bathing: Minimal assistance, Sitting Lower Body Bathing: Maximal assistance, Sit to/from stand Upper Body Dressing : Moderate assistance, Sitting Upper Body  Dressing Details (indicate cue type and reason): able to  place harness over neck Lower Body Dressing: Maximal assistance Lower Body Dressing Details (indicate cue type and reason): socks Toilet Transfer: +2 for physical assistance, Moderate assistance, Stand-pivot, Cueing for safety, Cueing for sequencing, BSC/3in1 Toileting- Clothing Manipulation and Hygiene: Maximal assistance Functional mobility during ADLs: Moderate assistance, Rolling walker (2 wheels), +2 for physical assistance General ADL Comments: working on power switch fom cord ot battery. Requires MAX A to organzie, sequence adn maintain attention to task; tried walking before hooked to both batteries - poor error recognition   Cognition: Cognition Orientation Level: Oriented X4 Cognition Arousal: Alert Behavior During Therapy: WFL for tasks assessed/performed     Review of Systems  Constitutional: Negative.   HENT: Negative.    Eyes: Negative.   Respiratory:  Positive for shortness of breath.   Cardiovascular:  Negative for chest pain.  Gastrointestinal: Negative.   Genitourinary: Negative.   Musculoskeletal: Negative.   Skin: Negative.   Neurological:  Positive for weakness.  Psychiatric/Behavioral:  Negative for depression.        Past Medical History:  Diagnosis Date   CKD (chronic kidney disease) stage 2, GFR 60-89 ml/min     COPD (chronic obstructive pulmonary disease) (HCC)     Diabetes mellitus type II, non insulin  dependent (HCC)     Dilated aortic root     Elevated PSA     Hypercholesteremia     Hypertension     NICM (nonischemic cardiomyopathy) (HCC)     NSVT (nonsustained ventricular tachycardia) (HCC)     Obstructive sleep apnea     Paroxysmal atrial fibrillation (HCC)     Pulmonary hypertension (HCC)     PVC's (premature ventricular contractions)     Tobacco abuse               Past Surgical History:  Procedure Laterality Date   CARDIAC CATHETERIZATION   10/15/2000   COLONOSCOPY N/A 03/22/2014    Procedure: COLONOSCOPY;  Surgeon: Margo LITTIE Haddock, MD;  Location: [facility];  Service: Endoscopy;  Laterality: N/A;  11:15 AM   ICD IMPLANT N/A 08/21/2021    Procedure: ICD IMPLANT;  Surgeon: Waddell Danelle ORN, MD;  Location: [facility] INVASIVE CV LAB;  Service: Cardiovascular;  Laterality: N/A;   INGUINAL HERNIA REPAIR Right 06/24/2017    Procedure: HERNIA REPAIR INGUINAL ADULT WITH MESH;  Surgeon: Mavis Anes, MD;  Location: [facility] ORS;  Service: General;  Laterality: Right;   INSERTION OF IMPLANTABLE LEFT VENTRICULAR ASSIST DEVICE N/A 10/14/2024    Procedure: INSERTION OF IMPLANTABLE LEFT VENTRICULAR ASSIST DEVICE HEARTMATE 3; REMOVAL OF IMPELLA 5.5;  Surgeon: Lucas Dorise POUR, MD;  Location: [facility] OR;  Service: Open Heart Surgery;  Laterality: N/A;   INTRAOPERATIVE TRANSESOPHAGEAL ECHOCARDIOGRAM N/A 10/06/2024    Procedure: ECHOCARDIOGRAM, TRANSESOPHAGEAL, INTRAOPERATIVE;  Surgeon: Daniel Con RAMAN, MD;  Location: [facility] OR;  Service: Open Heart Surgery;  Laterality: N/A;   INTRAOPERATIVE TRANSESOPHAGEAL ECHOCARDIOGRAM N/A 10/14/2024    Procedure: ECHOCARDIOGRAM, TRANSESOPHAGEAL, INTRAOPERATIVE;  Surgeon: Lucas Dorise POUR, MD;  Location: [facility] OR;  Service: Open Heart Surgery;  Laterality: N/A;   IR TUNNELED CENTRAL VENOUS CATH PLC W IMG   07/17/2024   PLACEMENT OF IMPELLA LEFT VENTRICULAR ASSIST DEVICE N/A 10/06/2024    Procedure: INSERTION, CARDIAC ASSIST DEVICE, IMPELLA 5.5;  Surgeon: Daniel Con RAMAN, MD;  Location: [facility] OR;  Service: Open Heart Surgery;  Laterality: N/A;   PROSTATE BIOPSY       RIGHT HEART CATH N/A  11/13/2022    Procedure: RIGHT HEART CATH;  Surgeon: Gardenia Led, DO;  Location: [facility] INVASIVE CV LAB;  Service: Cardiovascular;  Laterality: N/A;   RIGHT HEART CATH N/A 12/21/2022    Procedure: RIGHT HEART CATH;  Surgeon: Gardenia Led, DO;  Location: [facility] INVASIVE CV LAB;  Service: Cardiovascular;  Laterality: N/A;   RIGHT HEART CATH N/A 11/25/2023    Procedure: RIGHT HEART CATH;  Surgeon: Gardenia Led, DO;  Location: [facility] INVASIVE CV  LAB;  Service: Cardiovascular;  Laterality: N/A;   RIGHT HEART CATH N/A 10/01/2024    Procedure: RIGHT HEART CATH;  Surgeon: Rolan Ezra RAMAN, MD;  Location: [facility] INVASIVE CV LAB;  Service: Cardiovascular;  Laterality: N/A;   RIGHT HEART CATH N/A 10/07/2024    Procedure: RIGHT HEART CATH;  Surgeon: Cherrie Toribio SAUNDERS, MD;  Location: [facility] INVASIVE CV LAB;  Service: Cardiovascular;  Laterality: N/A;   RIGHT/LEFT HEART CATH AND CORONARY ANGIOGRAPHY N/A 07/19/2021    Procedure: RIGHT/LEFT HEART CATH AND CORONARY ANGIOGRAPHY;  Surgeon: Verlin Lonni BIRCH, MD;  Location: [facility] INVASIVE CV LAB;  Service: Cardiovascular;  Laterality: N/A;   TEE WITHOUT CARDIOVERSION N/A 12/21/2022    Procedure: TRANSESOPHAGEAL ECHOCARDIOGRAM (TEE);  Surgeon: Gardenia Led, DO;  Location: [facility] ENDOSCOPY;  Service: Cardiovascular;  Laterality: N/A;             Family History  Problem Relation Age of Onset   Coronary artery disease Mother     Diabetes Father     Heart attack Father     Hypertension Father     Iron deficiency Father     Thyroid  disease Sister     Cardiomyopathy Brother          No significant coronary disease by coronary angiography   Heart disease Maternal Grandfather          Also paternal grandfather   Sudden death Other     Colon cancer Neg Hx          Social History:  reports that he quit smoking about 3 years ago. His smoking use included cigarettes. He started smoking about 42 years ago. He has a 19.4 pack-year smoking history. He has never used smokeless tobacco. He reports that he does not drink alcohol and does not use drugs. Allergies: [Allergies]  [Allergies] No Known Allergies       Medications Prior to Admission  Medication Sig Dispense Refill   acetaminophen  (TYLENOL ) 500 MG tablet Take 1,000 mg by mouth every 6 (six) hours as needed for moderate pain.       albuterol  (VENTOLIN  HFA) 108 (90 Base) MCG/ACT inhaler INHALE 2 PUFFS BY MOUTH FOUR TIMES DAILY AS NEEDED (Patient  taking differently: Inhale 1 puff into the lungs every 6 (six) hours as needed for shortness of breath.) 6.7 g 1   amiodarone  (PACERONE ) 200 MG tablet Take 1 tablet (200 mg total) by mouth daily. 30 tablet 5   apixaban  (ELIQUIS ) 5 MG TABS tablet Take 1 tablet (5 mg total) by mouth 2 (two) times daily. 60 tablet 11   atorvastatin  (LIPITOR) 40 MG tablet Take 1 tablet (40 mg total) by mouth daily. 90 tablet 3   digoxin  (LANOXIN ) 0.125 MG tablet Take 1 tablet (0.125 mg total) by mouth daily. 30 tablet 5   FARXIGA  10 MG TABS tablet TAKE 1 TABLET(10 MG) BY MOUTH DAILY BEFORE BREAKFAST 90 tablet 3   hydrALAZINE  (APRESOLINE ) 50 MG tablet Take 1 tablet (50 mg total) by mouth every 8 (eight) hours. 90 tablet  5   metFORMIN (GLUCOPHAGE) 500 MG tablet TAKE 1 TABLET BY MOUTH TWICE DAILY 180 tablet 1   mexiletine (MEXITIL ) 200 MG capsule Take 1 capsule (200 mg total) by mouth every 8 (eight) hours. 90 capsule 5   milrinone  (PRIMACOR ) 20 MG/100 ML SOLN infusion Inject 0.0294 mg/min into the vein continuous.       ondansetron  (ZOFRAN ) 4 MG tablet Take 1 tablet (4 mg total) by mouth every 8 (eight) hours as needed for nausea or vomiting. 20 tablet 3   potassium chloride  SA (KLOR-CON  M) 20 MEQ tablet Take 1 tablet (20 mEq total) by mouth daily. 30 tablet 3   sacubitril -valsartan  (ENTRESTO ) 24-26 MG Take 1 tablet by mouth 2 (two) times daily. 90 tablet 2   spironolactone  (ALDACTONE ) 25 MG tablet Take 1 tablet (25 mg total) by mouth at bedtime. 90 tablet 3   torsemide  (DEMADEX ) 20 MG tablet Take 1 tablet (20 mg total) by mouth daily. 30 tablet 3            Blood pressure 102/75, pulse 68, temperature 97.9 F (36.6 C), temperature source Oral, resp. rate 17, height 6' 6 (1.981 m), weight 97.1 kg, SpO2 94%. Physical Exam Constitutional:      Appearance: He is obese.  HENT:     Head: Normocephalic.     Right Ear: External ear normal.     Left Ear: External ear normal.     Nose: Nose normal.  Eyes:      Extraocular Movements: Extraocular movements intact.     Pupils: Pupils are equal, round, and reactive to light.  Cardiovascular:     Comments: LVAD Hum Pulmonary:     Effort: Pulmonary effort is normal.  Abdominal:     General: There is no distension.     Palpations: Abdomen is soft.     Tenderness: There is no abdominal tenderness.     Comments: Sutures/LVAD drive line noted  Musculoskeletal:        General: Normal range of motion.     Cervical back: Normal range of motion.     Right lower leg: No edema.     Left lower leg: No edema.  Skin:    General: Skin is warm and dry.  Neurological:     Mental Status: He is alert.     Comments: Alert and oriented x 3. Normal insight and awareness. Intact Memory. Normal language and speech. Cranial nerve exam unremarkable. MMT: BUE 4+/5 prox to distal. BLE: 3/5 HF, 3+KE and 4/5 ADF/PF. Sensory exam normal for light touch and pain in all 4 limbs. No limb ataxia or cerebellar signs. No abnormal tone appreciated.  SABRA    Psychiatric:        Mood and Affect: Mood normal.        Behavior: Behavior normal.       Lab Results Last 24 Hours       Results for orders placed or performed during the hospital encounter of 10/01/24 (from the past 24 hours)  Ammonia     Status: None    Collection Time: 10/29/24 11:00 AM  Result Value Ref Range    Ammonia 16 9 - 35 umol/L  Glucose, capillary     Status: Abnormal    Collection Time: 10/29/24 12:06 PM  Result Value Ref Range    Glucose-Capillary 164 (H) 70 - 99 mg/dL  Glucose, capillary     Status: None    Collection Time: 10/29/24  4:08 PM  Result Value Ref  Range    Glucose-Capillary 91 70 - 99 mg/dL  Glucose, capillary     Status: Abnormal    Collection Time: 10/29/24  8:57 PM  Result Value Ref Range    Glucose-Capillary 150 (H) 70 - 99 mg/dL  Cooxemetry Panel (carboxy, met, total hgb, O2 sat)     Status: Abnormal    Collection Time: 10/30/24  5:52 AM  Result Value Ref Range    Total hemoglobin  9.7 (L) 12.0 - 16.0 g/dL    O2 Saturation 23.1 %    Carboxyhemoglobin 2.8 (H) 0.5 - 1.5 %    Methemoglobin 0.7 0.0 - 1.5 %  Lactate dehydrogenase     Status: Abnormal    Collection Time: 10/30/24  5:53 AM  Result Value Ref Range    LDH 325 (H) 105 - 235 U/L  Protime-INR     Status: Abnormal    Collection Time: 10/30/24  5:53 AM  Result Value Ref Range    Prothrombin Time 21.9 (H) 11.4 - 15.2 seconds    INR 1.8 (H) 0.8 - 1.2  Renal function panel     Status: Abnormal    Collection Time: 10/30/24  5:53 AM  Result Value Ref Range    Sodium 134 (L) 135 - 145 mmol/L    Potassium 4.2 3.5 - 5.1 mmol/L    Chloride 100 98 - 111 mmol/L    CO2 26 22 - 32 mmol/L    Glucose, Bld 90 70 - 99 mg/dL    BUN 21 8 - 23 mg/dL    Creatinine, Ser 8.88 0.61 - 1.24 mg/dL    Calcium  8.6 (L) 8.9 - 10.3 mg/dL    Phosphorus 2.8 2.5 - 4.6 mg/dL    Albumin  2.9 (L) 3.5 - 5.0 g/dL    GFR, Estimated >39 >39 mL/min    Anion gap 9 5 - 15  CBC     Status: Abnormal    Collection Time: 10/30/24  5:53 AM  Result Value Ref Range    WBC 8.2 4.0 - 10.5 K/uL    RBC 3.01 (L) 4.22 - 5.81 MIL/uL    Hemoglobin 9.4 (L) 13.0 - 17.0 g/dL    HCT 71.1 (L) 60.9 - 52.0 %    MCV 95.7 80.0 - 100.0 fL    MCH 31.2 26.0 - 34.0 pg    MCHC 32.6 30.0 - 36.0 g/dL    RDW 81.0 (H) 88.4 - 15.5 %    Platelets 216 150 - 400 K/uL    nRBC 0.0 0.0 - 0.2 %  Glucose, capillary     Status: Abnormal    Collection Time: 10/30/24  6:10 AM  Result Value Ref Range    Glucose-Capillary 102 (H) 70 - 99 mg/dL      Imaging Results (Last 48 hours)  CT HEAD WO CONTRAST ( ) Result Date: 10/29/2024 EXAM: CT HEAD WITHOUT CONTRAST 10/29/2024 01:54:24 PM TECHNIQUE: CT of the head was performed without the administration of intravenous contrast. Automated exposure control, iterative reconstruction, and/or weight based adjustment of the mA/kV was utilized to reduce the radiation dose to as low as reasonably achievable. COMPARISON: Head CT 10/21/2024.  CLINICAL HISTORY: Altered mental status, nontraumatic (Ped 0-17y). FINDINGS: BRAIN AND VENTRICLES: There is no evidence of an acute infarct, intracranial hemorrhage, midline shift, hydrocephalus, or extra-axial fluid collection. A mildly enlarged, partially empty sella is unchanged. A 9 mm extra-axial mass projecting leftward along the anterior aspect of the falx is unchanged without evidence of associated brain edema. There  is mild cerebral atrophy. Calcified atherosclerosis at the skull base. ORBITS: No acute abnormality. SINUSES: Unchanged complete right frontal sinus and anterior right ethmoid air cell opacification as well as persistent extensive opacification of the included portion of the left maxillary sinus. Clear mastoid air cells. SOFT TISSUES AND SKULL: No acute soft tissue abnormality. No skull fracture. IMPRESSION: 1. No acute intracranial abnormality. 2. Unchanged 9 mm anterior parafalcine meningioma. Electronically signed by: Dasie Hamburg MD 10/29/2024 02:05 PM EST RP Workstation: [id number]        Assessment/Plan: Diagnosis: 64 year old male with debility after prolonged hospital course centered around his nonischemic cardiomyopathy and LVAD placement Does the need for close, 24 hr/day medical supervision in concert with the patient's rehab needs make it unreasonable for this patient to be served in a less intensive setting? Yes Co-Morbidities requiring supervision/potential complications:  - Acute on chronic kidney disease -Atrial fibrillation -History of meningioma -Thrombocytopenia -Acute blood loss anemia -Hypokalemia and hypomagnesemia Due to bladder management, bowel management, safety, skin/wound care, disease management, medication administration, pain management, and patient education, does the patient require 24 hr/day rehab nursing? Yes Does the patient require coordinated care of a physician, rehab nurse, therapy disciplines of PT, OT to address physical and functional  deficits in the context of the above medical diagnosis(es)? Yes Addressing deficits in the following areas: balance, endurance, locomotion, strength, transferring, bowel/bladder control, bathing, dressing, feeding, grooming, toileting, and psychosocial support Can the patient actively participate in an intensive therapy program of at least 3 hrs of therapy per day at least 5 days per week? Yes The potential for patient to make measurable gains while on inpatient rehab is excellent Anticipated functional outcomes upon discharge from inpatient rehab are modified independent and supervision  with PT, modified independent and supervision with OT, n/a with SLP. Estimated rehab length of stay to reach the above functional goals is: 7-8 days Anticipated discharge destination: Home Overall Rehab/Functional Prognosis: excellent   POST ACUTE RECOMMENDATIONS: This patient's condition is appropriate for continued rehabilitative care in the following setting: CIR Patient has agreed to participate in recommended program. Yes Note that insurance prior authorization may be required for reimbursement for recommended care.   Comment: Rehab Admissions Coordinator to follow up.         I have personally performed a face to face diagnostic evaluation of this patient. Additionally, I have examined the patient's medical record including any pertinent labs and radiographic images.     Thanks,   Arthea ONEIDA Gunther, MD 10/30/2024            Routing History

## 2024-11-02 NOTE — Consult Note (Addendum)
 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 Patient having issues with constipation and  recommended suppository to lubricate perirectal area. Patient was also requesting if there were other options as he was sitting on portable commode. Instructed patient to not strain or bare down to force BM given he is a LVAD patient.  Dressing procedure/placement/frequency:  Sacral wound; Cleanse with NS, pat dry. Apply 1/4 thick layer of Santyl  to the yellow portion of the wound daily Apply Xeroform to wound bed and cover with Silicone foam dressing. Change daily   Buttock 11/02/24    WOC will not follow and will remove patient from census task list.  Please reconsult if wound worsens in condition and notify provider.   Sherrilyn Hals MSN RN CWOCN WOC [facility]  [phone] (Available from 7-3 pm Mon-Friday)

## 2024-11-02 NOTE — H&P (Signed)
 "   Physical Medicine and Rehabilitation Admission H&P    Functional deficits due to debility   HPI: James [name] is a 64 year old male with PMHx of prostate cancer, CKD stage IIa, meningioma, HFrEF due to NICM (dates back to 2011 diagnosed with nonischemic cardiomyopathy), aortic root aneurysm, tobacco abuse, pAF, OSA, HTN, HLD, DM, prostate cancer s/p EBRT & ADT (radiation completed April 2025), who has been home inotrope-dependent on milrinone  0.125 mcg initial echo showed EF 10 to 15% with mod reduced RV.  Echo 7/25 showed EF 25-30%, G3DD, RV mildly reduced, moderate MR.  Initial plan was to follow-up with [facility] transplant team,however after further discussion family decided he would prefer to consider LVAD.   The patient presented on 10/01/24 for planned right heart cath and planned hospital admission to optimize for LVAD implantation. Patient underwent impella 5.5 implantation on 12/23 by Dr. Con Clunes. Explant and LVAD implantation of took place on 12/31 by Dr. Lucas of which he received 4 FFP, 2 platelets, cell saver 788 cc with EBL 1250 CC. Total bypass time 1:56. Complete TEE assessment was performed by Dr. Stoltzfus. This showed a dilated LV with EF < 20%. There was no AI, trace MR, trivial TR, fair RV systolic function and no PFO.  Patient was transferred to ICU on vasoactives, volume optimization and RHC.  Postoperatively he required milrinone  and epi postop for RV support and was diuresed and able to wean off inotropes and pressors.  Postop course complicated by A-fib, requiring TEE/DCCV.  He converted back to normal sinus rhythm and was maintained on amiodarone .  He developed ICU delirium which ultimately improved after discontinuation of trazodone . For left upper extremity weakness a head CT was completed and was negative for acute changes. Prior to admission the patient was active in the community independently and driving, he lives with his spouse in a one level home with 4 steps to  enter. Patient has been working with PT and OT and currently requires moderate assist with RW with Max A to organize and maintain attention to task. He is mod assist +2 for mobility and transfers. Therapy evaluations completed due to patient decreased functional mobility was admitted for a comprehensive rehab program.       Review of Systems  Constitutional: Negative.   HENT: Negative.    Eyes: Negative.   Respiratory:  Positive for cough.   Cardiovascular: Negative.   Gastrointestinal:        Constipated   Genitourinary: Negative.   Musculoskeletal:  Positive for back pain.  Skin: Negative.   Neurological:  Positive for dizziness and weakness.  Psychiatric/Behavioral: Negative.          Past Medical History:  Diagnosis Date   CKD (chronic kidney disease) stage 2, GFR 60-89 ml/min    COPD (chronic obstructive pulmonary disease) (HCC)    Diabetes mellitus type II, non insulin  dependent (HCC)    Dilated aortic root    Elevated PSA    Hypercholesteremia    Hypertension    NICM (nonischemic cardiomyopathy) (HCC)    NSVT (nonsustained ventricular tachycardia) (HCC)    Obstructive sleep apnea    Paroxysmal atrial fibrillation (HCC)    Pulmonary hypertension (HCC)    PVC's (premature ventricular contractions)    Tobacco abuse    Past Surgical History:  Procedure Laterality Date   CARDIAC CATHETERIZATION  10/15/2000   COLONOSCOPY N/A 03/22/2014   Procedure: COLONOSCOPY;  Surgeon: Margo LITTIE Haddock, MD;  [facility]: [facility];  Service:  Endoscopy;  Laterality: N/A;  11:15 AM   ICD IMPLANT N/A 08/21/2021   Procedure: ICD IMPLANT;  Surgeon: Waddell Danelle ORN, MD;  Location: [facility] INVASIVE CV LAB;  Service: Cardiovascular;  Laterality: N/A;   INGUINAL HERNIA REPAIR Right 06/24/2017   Procedure: HERNIA REPAIR INGUINAL ADULT WITH MESH;  Surgeon: Mavis Anes, MD;  Location: [facility] ORS;  Service: General;  Laterality: Right;   INSERTION OF IMPLANTABLE LEFT VENTRICULAR ASSIST DEVICE N/A  10/14/2024   Procedure: INSERTION OF IMPLANTABLE LEFT VENTRICULAR ASSIST DEVICE HEARTMATE 3; REMOVAL OF IMPELLA 5.5;  Surgeon: Lucas Dorise POUR, MD;  Location: [facility] OR;  Service: Open Heart Surgery;  Laterality: N/A;   INTRAOPERATIVE TRANSESOPHAGEAL ECHOCARDIOGRAM N/A 10/06/2024   Procedure: ECHOCARDIOGRAM, TRANSESOPHAGEAL, INTRAOPERATIVE;  Surgeon: Daniel Con RAMAN, MD;  Location: [facility] OR;  Service: Open Heart Surgery;  Laterality: N/A;   INTRAOPERATIVE TRANSESOPHAGEAL ECHOCARDIOGRAM N/A 10/14/2024   Procedure: ECHOCARDIOGRAM, TRANSESOPHAGEAL, INTRAOPERATIVE;  Surgeon: Lucas Dorise POUR, MD;  Location: [facility] OR;  Service: Open Heart Surgery;  Laterality: N/A;   IR TUNNELED CENTRAL VENOUS CATH PLC W IMG  07/17/2024   PLACEMENT OF IMPELLA LEFT VENTRICULAR ASSIST DEVICE N/A 10/06/2024   Procedure: INSERTION, CARDIAC ASSIST DEVICE, IMPELLA 5.5;  Surgeon: Daniel Con RAMAN, MD;  Location: [facility] OR;  Service: Open Heart Surgery;  Laterality: N/A;   PROSTATE BIOPSY     RIGHT HEART CATH N/A 11/13/2022   Procedure: RIGHT HEART CATH;  Surgeon: Gardenia Led, DO;  [facility]: [facility] INVASIVE CV LAB;  Service: Cardiovascular;  Laterality: N/A;   RIGHT HEART CATH N/A 12/21/2022   Procedure: RIGHT HEART CATH;  Surgeon: Gardenia Led, DO;  [facility] INVAS[facility] CV LAB;  Service: Cardiovascular;  Laterality: N/A;   RIGHT HEART CATH N/A 11/25/2023   Procedure: RIGHT HEART CATH;  Surgeon: Gardenia Led, DO;  [facility] INVAS[facility] CV LAB;  Service: Cardiovascular;  Laterality: N/A;   RIGHT HEART CATH N/A 10/01/2024   Procedure: RIGHT HEART CATH;  Surgeon: Rolan Ezra RAMAN, MD;  Location: [facility] INVASIVE CV LAB;  Service: Cardiovascular;  Laterality: N/A;   RIGHT HEART CATH N/A 10/07/2024   Procedure: RIGHT HEART CATH;  Surgeon: Cherrie Toribio SAUNDERS, MD;  [facility] INVAS[facility] CV LAB;  Service: Cardiovascular;  Laterality: N/A;   RIGHT/LEFT HEART CATH AND CORONARY ANGIOGRAPHY N/A 07/19/2021   Procedure: RIGHT/LEFT HEART CATH AND  CORONARY ANGIOGRAPHY;  Surgeon: Verlin Lonni BIRCH, MD;  [facility] INVAS[facility] CV LAB;  Service: Cardiovascular;  Laterality: N/A;   TEE WITHOUT CARDIOVERSION N/A 12/21/2022   Procedure: TRANSESOPHAGEAL ECHOCARDIOGRAM (TEE);  Surgeon: Gardenia Led, DO;  [facility] ENDOS[facility];  Service: Cardiovascular;  Laterality: N/A;   Family History  Problem Relation Age of Onset   Coronary artery disease Mother    Diabetes Father    Heart attack Father    Hypertension Father    Iron deficiency Father    Thyroid  disease Sister    Cardiomyopathy Brother        No significant coronary disease by coronary angiography   Heart disease Maternal Grandfather        Also paternal grandfather   Sudden death Other    Colon cancer Neg Hx    Social History:  reports that he quit smoking about 3 years ago. His smoking use included cigarettes. He started smoking about 42 years ago. He has a 19.4 pack-year smoking history. He has never used smokeless tobacco. He reports that he does not drink alcohol and does not use drugs. Allergies: Allergies[1] Medications Prior to Admission  Medication Sig Dispense Refill   acetaminophen  (TYLENOL ) 500 MG tablet Take 1,000 mg by mouth every 6 (six) hours as needed for moderate pain.     albuterol  (VENTOLIN  HFA) 108 (90 Base) MCG/ACT inhaler INHALE 2 PUFFS BY MOUTH FOUR TIMES DAILY AS NEEDED (Patient taking differently: Inhale 1 puff into the lungs every 6 (six) hours as needed for shortness of breath.) 6.7 g 1   amiodarone  (PACERONE ) 200 MG tablet Take 1 tablet (200 mg total) by mouth daily. 30 tablet 5   apixaban  (ELIQUIS ) 5 MG TABS tablet Take 1 tablet (5 mg total) by mouth 2 (two) times daily. 60 tablet 11   atorvastatin  (LIPITOR) 40 MG tablet Take 1 tablet (40 mg total) by mouth daily. 90 tablet 3   digoxin  (LANOXIN ) 0.125 MG tablet Take 1 tablet (0.125 mg total) by mouth daily. 30 tablet 5   FARXIGA  10 MG TABS tablet TAKE 1 TABLET(10 MG) BY MOUTH DAILY BEFORE  BREAKFAST 90 tablet 3   hydrALAZINE  (APRESOLINE ) 50 MG tablet Take 1 tablet (50 mg total) by mouth every 8 (eight) hours. 90 tablet 5   metFORMIN (GLUCOPHAGE) 500 MG tablet TAKE 1 TABLET BY MOUTH TWICE DAILY 180 tablet 1   mexiletine (MEXITIL ) 200 MG capsule Take 1 capsule (200 mg total) by mouth every 8 (eight) hours. 90 capsule 5   milrinone  (PRIMACOR ) 20 MG/100 ML SOLN infusion Inject 0.0294 mg/min into the vein continuous.     ondansetron  (ZOFRAN ) 4 MG tablet Take 1 tablet (4 mg total) by mouth every 8 (eight) hours as needed for nausea or vomiting. 20 tablet 3   potassium chloride  SA (KLOR-CON  M) 20 MEQ tablet Take 1 tablet (20 mEq total) by mouth daily. 30 tablet 3   sacubitril -valsartan  (ENTRESTO ) 24-26 MG Take 1 tablet by mouth 2 (two) times daily. 90 tablet 2   spironolactone  (ALDACTONE ) 25 MG tablet Take 1 tablet (25 mg total) by mouth at bedtime. 90 tablet 3   torsemide  (DEMADEX ) 20 MG tablet Take 1 tablet (20 mg total) by mouth daily. 30 tablet 3      Home: Home Living Family/patient expects to be discharged to:: Private residence Living Arrangements: Spouse/significant other Available Help at Discharge: Family (potentially can arrange 24/7, wife works second shift) Type of Home: House Home Access: Stairs to enter [employer] of Steps: 4 (in back (easiest access to home per pt), 6 at side) Entrance Stairs-Rails: Can reach both (on x4 stairs in back) Home Layout: One level Bathroom Shower/Tub: Engineer, Manufacturing Systems: Standard Bathroom Accessibility: Yes Home Equipment: Hand held shower head  Lives With: Spouse   Functional History: Prior Function Prior Level of Function : Independent/Modified Independent, Driving, Working/employed Mobility Comments: No AD ADLs Comments: Works as a merchandiser, 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 [employer] Insurance Status:  Mobility: Bed Mobility Overal bed mobility: Needs Assistance Bed Mobility: Supine  to Sit, Sit to Supine Rolling: Min assist Sidelying to sit: Min assist Supine to sit: Min assist Sit to supine: Min assist Sit to sidelying: Mod assist, +2 for physical assistance, HOB elevated General bed mobility comments: pt able to come to EoB with HOB elevated and cues to not pull up, min A for trunk elevation Transfers Overall transfer level: Needs assistance Equipment used: Rolling walker (2 wheels) Transfers: Sit to/from Stand Sit to Stand: Mod assist, From elevated surface Bed to/from chair/wheelchair/BSC transfer type:: Step pivot Step pivot transfers: +2 physical assistance, Min assist General transfer comment: Pt able to scoot  to EOB with mod A, HIGHLY elevated surface, and mod A with rocking momentum to get him on his feet. He required use of gait belt and max A to stand from 3 in1 set to high level over the toilet. Due to his height and body mass his bottom gets stuck in the 3 in 1 Ambulation/Gait Ambulation/Gait assistance: Min assist, +2 safety/equipment Gait Distance (Feet): 400 Feet Assistive device: Rolling walker (2 wheels) Gait Pattern/deviations: Decreased stride length, Step-through pattern General Gait Details: pt continues to benefit from RW to keep upright posture Gait velocity: reduced Gait velocity interpretation: <1.8 ft/sec, indicate of risk for recurrent falls Pre-gait activities: Static march with BIL UEs on RW.    ADL: ADL Overall ADL's : Needs assistance/impaired Eating/Feeding: Set up Eating/Feeding Details (indicate cue type and reason): assist for opening containers, seasoning packets etc - Pt attempted but ultimately required assist Grooming: Set up, Sitting, Wash/dry hands Grooming Details (indicate cue type and reason): dependent on BUE for standing balance - requires seated position for safety Upper Body Bathing: Minimal assistance, Sitting Lower Body Bathing: Maximal assistance, Sit to/from stand Upper Body Dressing : Moderate assistance,  Sitting Upper Body Dressing Details (indicate cue type and reason): able to place harness over neck Lower Body Dressing: Maximal assistance Lower Body Dressing Details (indicate cue type and reason): socks Toilet Transfer: Moderate assistance, Rolling walker (2 wheels), Ambulation Toilet Transfer Details (indicate cue type and reason): mod A to boost into standing even from elevated bed Toileting- Clothing Manipulation and Hygiene: Maximal assistance, Sit to/from stand Toileting - Clothing Manipulation Details (indicate cue type and reason): rear peri care, Pt educated on compensatory strategies Functional mobility during ADLs: Moderate assistance, Rolling walker (2 wheels) General ADL Comments: working on power switch fom cord ot battery. Requires MAX A to organzie, sequence and maintain attention to task; poor error recognition  Cognition: Cognition Orientation Level: Oriented X4 Cognition Arousal: Alert Behavior During Therapy: [facility] for tasks assessed/performed  Physical Exam: Blood pressure 94/79, pulse 67, temperature 98.7 F (37.1 C), temperature source Oral, resp. rate 20, height 6' 6 (1.981 m), weight 91.1 kg, SpO2 94%. Physical Exam Constitutional:      General: He is not in acute distress. HENT:     Head: Normocephalic.     Right Ear: External ear normal.     Left Ear: External ear normal.     Mouth/Throat:     Mouth: Mucous membranes are moist.  Eyes:     Conjunctiva/sclera: Conjunctivae normal.  Cardiovascular:     Comments: LVAD hum Pulmonary:     Effort: Pulmonary effort is normal. No respiratory distress.     Breath sounds: No wheezing or rales.  Abdominal:     General: Bowel sounds are normal. There is no distension.     Tenderness: There is no abdominal tenderness.  Musculoskeletal:        General: No swelling. Normal range of motion.     Cervical back: Normal range of motion.  Skin:    General: Skin is warm.     Comments: Sacral pressure wound   Neurological:     General: No focal deficit present.     Mental Status: He is alert.     Cranial Nerves: No cranial nerve deficit.     Motor: No weakness.  Psychiatric:        Mood and Affect: Mood normal.     Results for orders placed or performed during the hospital encounter of 10/01/24 (from the past 48  hours)  Glucose, capillary     Status: Abnormal   Collection Time: 10/31/24  4:31 PM  Result Value Ref Range   Glucose-Capillary 109 (H) 70 - 99 mg/dL    Comment: Glucose reference range applies only to samples taken after fasting for at least 8 hours.  Glucose, capillary     Status: Abnormal   Collection Time: 10/31/24  9:12 PM  Result Value Ref Range   Glucose-Capillary 127 (H) 70 - 99 mg/dL    Comment: Glucose reference range applies only to samples taken after fasting for at least 8 hours.  Lactate dehydrogenase     Status: Abnormal   Collection Time: 11/01/24  5:00 AM  Result Value Ref Range   LDH 260 (H) 105 - 235 U/L    Comment: Performed at [facility], [address] [place], [place] [address]  Protime-INR     Status: Abnormal   Collection Time: 11/01/24  5:00 AM  Result Value Ref Range   Prothrombin Time 19.7 (H) 11.4 - 15.2 seconds   INR 1.6 (H) 0.8 - 1.2    Comment: (NOTE) INR goal varies based on device and disease states. Performed at [facility], [address] [place], [place] [address]   Cooxemetry Panel (carboxy, met, total hgb, O2 sat)     Status: Abnormal   Collection Time: 11/01/24  5:00 AM  Result Value Ref Range   Total hemoglobin 11.2 (L) 12.0 - 16.0 g/dL   O2 Saturation 55.3 %   Carboxyhemoglobin 2.2 (H) 0.5 - 1.5 %   Methemoglobin <0.7 0.0 - 1.5 %    Comment: Performed at [facility], [address] [place], [place] [address]  Renal function panel     Status: Abnormal   Collection Time: 11/01/24  5:00 AM  Result Value Ref Range   Sodium 135 135 - 145 mmol/L   Potassium 4.6 3.5 - 5.1 mmol/L   Chloride 99 98 - 111  mmol/L   CO2 25 22 - 32 mmol/L   Glucose, Bld 113 (H) 70 - 99 mg/dL    Comment: Glucose reference range applies only to samples taken after fasting for at least 8 hours.   BUN 21 8 - 23 mg/dL   Creatinine, Ser 8.87 0.61 - 1.24 mg/dL   Calcium  8.5 (L) 8.9 - 10.3 mg/dL   Phosphorus 3.0 2.5 - 4.6 mg/dL   Albumin  3.2 (L) 3.5 - 5.0 g/dL   GFR, Estimated >39 >39 mL/min    Comment: (NOTE) Calculated using the CKD-EPI Creatinine Equation (2021)    Anion gap 11 5 - 15    Comment: Performed at [facility], [address], [place], [place] [address]  CBC     Status: Abnormal   Collection Time: 11/01/24  5:00 AM  Result Value Ref Range   WBC 9.4 4.0 - 10.5 K/uL   RBC 3.53 (L) 4.22 - 5.81 MIL/uL   Hemoglobin 11.0 (L) 13.0 - 17.0 g/dL   HCT 65.9 (L) 60.9 - 47.9 %   MCV 96.3 80.0 - 100.0 fL   MCH 31.2 26.0 - 34.0 pg   MCHC 32.4 30.0 - 36.0 g/dL   RDW 81.5 (H) 88.4 - 84.4 %   Platelets 249 150 - 400 K/uL   nRBC 0.0 0.0 - 0.2 %    Comment: Performed at [facility], [address] [place], [place] [address]  Glucose, capillary     Status: None  Collection Time: 11/01/24  6:13 AM  Result Value Ref Range   Glucose-Capillary 93 70 - 99 mg/dL    Comment: Glucose reference range applies only to samples taken after fasting for at least 8 hours.  Glucose, capillary     Status: Abnormal   Collection Time: 11/01/24 10:54 AM  Result Value Ref Range   Glucose-Capillary 111 (H) 70 - 99 mg/dL    Comment: Glucose reference range applies only to samples taken after fasting for at least 8 hours.  Glucose, capillary     Status: Abnormal   Collection Time: 11/01/24  3:05 PM  Result Value Ref Range   Glucose-Capillary 142 (H) 70 - 99 mg/dL    Comment: Glucose reference range applies only to samples taken after fasting for at least 8 hours.  Cooxemetry Panel (carboxy, met, total hgb, O2 sat)     Status: Abnormal   Collection Time: 11/01/24  3:13 PM  Result Value Ref Range   Total hemoglobin  10.6 (L) 12.0 - 16.0 g/dL   O2 Saturation 31.5 %   Carboxyhemoglobin 1.5 0.5 - 1.5 %   Methemoglobin <0.7 0.0 - 1.5 %    Comment: Performed at [facility], [address] [place], [place] [address]  Glucose, capillary     Status: Abnormal   Collection Time: 11/01/24  9:04 PM  Result Value Ref Range   Glucose-Capillary 122 (H) 70 - 99 mg/dL    Comment: Glucose reference range applies only to samples taken after fasting for at least 8 hours.  Glucose, capillary     Status: Abnormal   Collection Time: 11/02/24  5:48 AM  Result Value Ref Range   Glucose-Capillary 102 (H) 70 - 99 mg/dL    Comment: Glucose reference range applies only to samples taken after fasting for at least 8 hours.  Lactate dehydrogenase     Status: None   Collection Time: 11/02/24  5:53 AM  Result Value Ref Range   LDH 218 105 - 235 U/L    Comment: Performed at [facility], [address], [place], [place] [address]  Protime-INR     Status: Abnormal   Collection Time: 11/02/24  5:53 AM  Result Value Ref Range   Prothrombin Time 20.6 (H) 11.4 - 15.2 seconds   INR 1.7 (H) 0.8 - 1.2    Comment: (NOTE) INR goal varies based on device and disease states. Performed at [facility], [address], [place], [place] [address]   CBC     Status: Abnormal   Collection Time: 11/02/24  5:53 AM  Result Value Ref Range   WBC 8.6 4.0 - 10.5 K/uL   RBC 3.43 (L) 4.22 - 5.81 MIL/uL   Hemoglobin 10.7 (L) 13.0 - 17.0 g/dL   HCT 66.9 (L) 60.9 - 47.9 %   MCV 96.2 80.0 - 100.0 fL   MCH 31.2 26.0 - 34.0 pg   MCHC 32.4 30.0 - 36.0 g/dL   RDW 81.9 (H) 88.4 - 84.4 %   Platelets 202 150 - 400 K/uL   nRBC 0.0 0.0 - 0.2 %    Comment: Performed at [facility], [address], [place], [place] [address]  Renal function panel     Status: Abnormal   Collection Time: 11/02/24  5:54 AM  Result Value Ref Range   Sodium 131 (L) 135 - 145 mmol/L   Potassium 4.2 3.5 - 5.1 mmol/L   Chloride 98 98 - 111 mmol/L  CO2 26 22 - 32 mmol/L   Glucose, Bld 110 (H) 70 - 99 mg/dL    Comment: Glucose reference range applies only to samples taken after fasting for at least 8 hours.   BUN 22 8 - 23 mg/dL   Creatinine, Ser 8.91 0.61 - 1.24 mg/dL   Calcium  8.5 (L) 8.9 - 10.3 mg/dL   Phosphorus 2.7 2.5 - 4.6 mg/dL   Albumin  3.1 (L) 3.5 - 5.0 g/dL   GFR, Estimated >39 >39 mL/min    Comment: (NOTE) Calculated using the CKD-EPI Creatinine Equation (2021)    Anion gap 8 5 - 15    Comment: Performed at [facility], [address], [place], [place] [address]  Glucose, capillary     Status: Abnormal   Collection Time: 11/02/24 11:02 AM  Result Value Ref Range   Glucose-Capillary 145 (H) 70 - 99 mg/dL    Comment: Glucose reference range applies only to samples taken after fasting for at least 8 hours.   No results found.    Blood pressure 94/79, pulse 67, temperature 98.7 F (37.1 C), temperature source Oral, resp. rate 20, height 6' 6 (1.981 m), weight 91.1 kg, SpO2 94%.  Medical Problem List and Plan: 1. Functional deficits secondary to debility related CHF/ LVAD placement  -patient may  shower  -ELOS/Goals: 5-7 days, mod I 2.  Antithrombotics: -DVT/anticoagulation:  Mechanical:  Antiembolism stockings, knee (TED hose) Bilateral lower extremities   -antiplatelet therapy: Was on apixaban  PTA for hx Afib > now with LVAD on warfarin  INR Goal: 2.0 - 2.5  coumadin  dosing per pharmacy.  3. Pain Management: Gabapentin  200 mg at bedtime. PRN: tylenol  and oxycodone   4. Mood/Behavior/Sleep: LCSW to follow for evaluation and support when available.   -antipsychotic agents: n/a 5. Neuropsych/cognition: This patient is capable of making decisions on his own behalf. 6. Skin/Wound Care: Routine pressure relief measures. Continue sternal precautions.  Drive Line: Continue MWF dressing changes by VAD probation officer. Next dressing change due 11/04/24 by VAD coordinator or nurse champion. -Sacral  wound: Continue Santyl  oint apply to yellow portion of sacral wound daily. Pressure relief, encourage appropriate nutrition   7. Fluids/Electrolytes/Nutrition: Monitor strict I&O and daily weight.    -GERD: Protonix    -Ensure and vitamin supplements   8. Acute on chronic systolic CHF, NYHA IV: S/p HM III LVAD implant by Dr. Lucas 10/14/24. Advanced HF team following.   - Continue Lasix  40 mg daily with potassium 40 mEq, Sildenafil  20 mg TID for RV support. Digoxin  0.0625 mg daily. Dig level 0.9 1/13. Spironolactone  50 mg bid. Warfarin per pharmacy dosing.   -daily weights 9. Pulmonary hypertension: Severe combined pre/post capillary PH on RHC-resolved   10. AKI on CKD stage 3: Scr stable, follow BMP in AM.   11. PVCs/NSVT: Improved. Has Biotronik ICD.   -Continue mexiletine 150 mg bid.   12. Persistent Atrial fibrillation: s/p DCCV 1/12, currently NSR.   -Continue amiodarone  200 mg bid and warfarin.   13. ABLA: Hgb 10.7, Monitor daily INR, CBC, and for s/sx of bleeding.   14. Hypokalemia/Hypomagnesemia: On potassium 40 mEq, continue supplementation PRN.  15. T2DM: A1c 6.0-- Holding home Metformin and Farxiga . Insulin  reqs significantly decreased since PO intake.  -Continue monitoring glucose ac/hs with SSI as needed.   16. Constipation: Small recorded on 1/19, pt still endrosing constipation.   -Miralax  now BID, Senokot-S at bedtime.  -give 60 cc sorbitol  tonight.  -PRN senokot and sorbitol .   17.  Prostate cancer: s/p radiation.   18. Meningioma: Monitoring.       Daphne LOISE Satterfield, NP 11/02/2024     [1] No Known Allergies  "

## 2024-11-03 LAB — CBC WITH DIFFERENTIAL/PLATELET
Abs Immature Granulocytes: 0.08 K/uL — ABNORMAL HIGH (ref 0.00–0.07)
Basophils Absolute: 0 K/uL (ref 0.0–0.1)
Basophils Relative: 0 %
Eosinophils Absolute: 0.1 K/uL (ref 0.0–0.5)
Eosinophils Relative: 1 %
HCT: 33.9 % — ABNORMAL LOW (ref 39.0–52.0)
Hemoglobin: 11.2 g/dL — ABNORMAL LOW (ref 13.0–17.0)
Immature Granulocytes: 1 %
Lymphocytes Relative: 6 %
Lymphs Abs: 0.6 K/uL — ABNORMAL LOW (ref 0.7–4.0)
MCH: 31.5 pg (ref 26.0–34.0)
MCHC: 33 g/dL (ref 30.0–36.0)
MCV: 95.2 fL (ref 80.0–100.0)
Monocytes Absolute: 0.9 K/uL (ref 0.1–1.0)
Monocytes Relative: 10 %
Neutro Abs: 7.5 K/uL (ref 1.7–7.7)
Neutrophils Relative %: 82 %
Platelets: 211 K/uL (ref 150–400)
RBC: 3.56 MIL/uL — ABNORMAL LOW (ref 4.22–5.81)
RDW: 17.9 % — ABNORMAL HIGH (ref 11.5–15.5)
WBC: 9.1 K/uL (ref 4.0–10.5)
nRBC: 0.2 % (ref 0.0–0.2)

## 2024-11-03 LAB — PROTIME-INR
INR: 2 — ABNORMAL HIGH (ref 0.8–1.2)
Prothrombin Time: 23.4 s — ABNORMAL HIGH (ref 11.4–15.2)

## 2024-11-03 LAB — COMPREHENSIVE METABOLIC PANEL WITH GFR
ALT: 47 U/L — ABNORMAL HIGH (ref 0–44)
AST: 32 U/L (ref 15–41)
Albumin: 3.1 g/dL — ABNORMAL LOW (ref 3.5–5.0)
Alkaline Phosphatase: 114 U/L (ref 38–126)
Anion gap: 10 (ref 5–15)
BUN: 22 mg/dL (ref 8–23)
CO2: 26 mmol/L (ref 22–32)
Calcium: 8.7 mg/dL — ABNORMAL LOW (ref 8.9–10.3)
Chloride: 101 mmol/L (ref 98–111)
Creatinine, Ser: 1.17 mg/dL (ref 0.61–1.24)
GFR, Estimated: >60 mL/min
Glucose, Bld: 101 mg/dL — ABNORMAL HIGH (ref 70–99)
Potassium: 4.6 mmol/L (ref 3.5–5.1)
Sodium: 137 mmol/L (ref 135–145)
Total Bilirubin: 0.9 mg/dL (ref 0.0–1.2)
Total Protein: 6.3 g/dL — ABNORMAL LOW (ref 6.5–8.1)

## 2024-11-03 LAB — MAGNESIUM: Magnesium: 2 mg/dL (ref 1.7–2.4)

## 2024-11-03 LAB — GLUCOSE, CAPILLARY
Glucose-Capillary: 102 mg/dL — ABNORMAL HIGH (ref 70–99)
Glucose-Capillary: 140 mg/dL — ABNORMAL HIGH (ref 70–99)
Glucose-Capillary: 106 mg/dL — ABNORMAL HIGH (ref 70–99)
Glucose-Capillary: 112 mg/dL — ABNORMAL HIGH (ref 70–99)

## 2024-11-03 MED ORDER — WARFARIN SODIUM 4 MG PO TABS
4.0000 mg | ORAL_TABLET | Freq: Once | ORAL | Status: AC
  Administered 2024-11-03: 4 mg via ORAL
  Filled 2024-11-03: qty 1

## 2024-11-03 MED ORDER — OFLOXACIN 0.3 % OP SOLN
5.0000 [drp] | Freq: Every day | OPHTHALMIC | Status: DC
  Administered 2024-11-03 – 2024-11-04 (×2): 5 [drp] via OTIC
  Filled 2024-11-03: qty 5

## 2024-11-03 NOTE — Progress Notes (Signed)
 PHARMACY - ANTICOAGULATION CONSULT NOTE  Pharmacy Consult for warfarin Indication: LVAD HM3 + hx AFib  Allergies[1]  Patient Measurements: Height: 6' 6 (198.1 cm) Weight: 91 kg (200 lb 10.3 oz) IBW/kg (Calculated) : 91.4  Vital Signs: BP: 100/82 (01/20 0400)  Labs: Recent Labs    11/01/24 0500 11/02/24 0553 11/02/24 0554 11/03/24 0459  HGB 11.0* 10.7*  --  11.2*  HCT 34.0* 33.0*  --  33.9*  PLT 249 202  --  211  LABPROT 19.7* 20.6*  --  23.4*  INR 1.6* 1.7*  --  2.0*  CREATININE 1.12  --  1.08 1.17    Estimated Creatinine Clearance: 83.2 mL/min (by C-G formula based on SCr of 1.17 mg/dL).   Medical History: Past Medical History:  Diagnosis Date   CKD (chronic kidney disease) stage 2, GFR 60-89 ml/min    COPD (chronic obstructive pulmonary disease) (HCC)    Diabetes mellitus type II, non insulin  dependent (HCC)    Dilated aortic root    Elevated PSA    Hypercholesteremia    Hypertension    NICM (nonischemic cardiomyopathy) (HCC)    NSVT (nonsustained ventricular tachycardia) (HCC)    Obstructive sleep apnea    Paroxysmal atrial fibrillation (HCC)    Pulmonary hypertension (HCC)    PVC's (premature ventricular contractions)    Tobacco abuse     Medications:  Scheduled:   amiodarone   200 mg Oral BID   atorvastatin   40 mg Oral Daily   Chlorhexidine  Gluconate Cloth  6 each Topical Daily   collagenase    Topical Daily   digoxin   0.0625 mg Oral Daily   furosemide   40 mg Oral Daily   gabapentin   200 mg Oral QHS   insulin  aspart  0-15 Units Subcutaneous TID WC   lactose free nutrition  237 mL Oral Daily   mexiletine  150 mg Oral Q12H   multivitamin with minerals  1 tablet Oral Daily   pantoprazole   40 mg Oral Daily   potassium chloride   40 mEq Oral Daily   senna-docusate  2 tablet Oral QHS   sildenafil   20 mg Oral TID   sodium chloride  flush  10-40 mL Intracatheter Q12H   spironolactone   50 mg Oral BID   Warfarin - Pharmacist Dosing Inpatient   Does not  apply q1600   zinc  sulfate (50mg  elemental zinc )  220 mg Oral Q supper    Assessment: 63 yom who underwent LVAD HM3 placement on 12/31. Was on apixaban  PTA for hx Afib > now with LVAD plan to change to warfarin.   INR is therapeutic at 2. Hgb 11.2, plt 211. No s/sx of bleeding. Oral intake documented at 50% yesterday for 1 meal but has endorsed eating better.   Goal of Therapy:  INR 2-2.5 Monitor platelets by anticoagulation protocol: Yes   Plan:  Warfarin 4 mg daily  Monitor daily INR, CBC, and for s/sx of bleeding  F/u PO intake  Thank you for allowing pharmacy to participate in this patient's care,  Suzen Sour, PharmD, BCCCP Clinical Pharmacist  Phone: [phone] 11/03/2024 9:25 AM  Please check AMION for all [facility] Pharmacy phone numbers After 10:00 PM, call Main Pharmacy [phone]      [1] No Known Allergies

## 2024-11-03 NOTE — Progress Notes (Signed)
 Occupational Therapy Assessment and Plan  Patient Details  Name: James [name] MRN: [id number] Date of Birth: 1961-04-24  OT Diagnosis: muscle weakness (generalized) Rehab Potential: Rehab Potential (ACUTE ONLY): Good ELOS: 5-7 days   Today's Date: 11/03/2024 OT Individual Time: 0900-1000 OT Individual Time Calculation (min): 60 min     Hospital Problem: Principal Problem:   Debility   Past Medical History:  Past Medical History:  Diagnosis Date   CKD (chronic kidney disease) stage 2, GFR 60-89 ml/min    COPD (chronic obstructive pulmonary disease) (HCC)    Diabetes mellitus type II, non insulin  dependent (HCC)    Dilated aortic root    Elevated PSA    Hypercholesteremia    Hypertension    NICM (nonischemic cardiomyopathy) (HCC)    NSVT (nonsustained ventricular tachycardia) (HCC)    Obstructive sleep apnea    Paroxysmal atrial fibrillation (HCC)    Pulmonary hypertension (HCC)    PVC's (premature ventricular contractions)    Tobacco abuse    Past Surgical History:  Past Surgical History:  Procedure Laterality Date   CARDIAC CATHETERIZATION  10/15/2000   COLONOSCOPY N/A 03/22/2014   Procedure: COLONOSCOPY;  Surgeon: Margo LITTIE Haddock, MD;  Location: [facility];  Service: Endoscopy;  Laterality: N/A;  11:15 AM   ICD IMPLANT N/A 08/21/2021   Procedure: ICD IMPLANT;  Surgeon: Waddell Danelle ORN, MD;  Location: [facility] INVASIVE CV LAB;  Service: Cardiovascular;  Laterality: N/A;   INGUINAL HERNIA REPAIR Right 06/24/2017   Procedure: HERNIA REPAIR INGUINAL ADULT WITH MESH;  Surgeon: Mavis Anes, MD;  Location: [facility] ORS;  Service: General;  Laterality: Right;   INSERTION OF IMPLANTABLE LEFT VENTRICULAR ASSIST DEVICE N/A 10/14/2024   Procedure: INSERTION OF IMPLANTABLE LEFT VENTRICULAR ASSIST DEVICE HEARTMATE 3; REMOVAL OF IMPELLA 5.5;  Surgeon: Lucas Dorise POUR, MD;  Location: [facility] OR;  Service: Open Heart Surgery;  Laterality: N/A;   INTRAOPERATIVE TRANSESOPHAGEAL ECHOCARDIOGRAM N/A  10/06/2024   Procedure: ECHOCARDIOGRAM, TRANSESOPHAGEAL, INTRAOPERATIVE;  Surgeon: Daniel Con RAMAN, MD;  Location: [facility] OR;  Service: Open Heart Surgery;  Laterality: N/A;   INTRAOPERATIVE TRANSESOPHAGEAL ECHOCARDIOGRAM N/A 10/14/2024   Procedure: ECHOCARDIOGRAM, TRANSESOPHAGEAL, INTRAOPERATIVE;  Surgeon: Lucas Dorise POUR, MD;  Location: [facility] OR;  Service: Open Heart Surgery;  Laterality: N/A;   IR TUNNELED CENTRAL VENOUS CATH PLC W IMG  07/17/2024   PLACEMENT OF IMPELLA LEFT VENTRICULAR ASSIST DEVICE N/A 10/06/2024   Procedure: INSERTION, CARDIAC ASSIST DEVICE, IMPELLA 5.5;  Surgeon: Daniel Con RAMAN, MD;  Location: [facility] OR;  Service: Open Heart Surgery;  Laterality: N/A;   PROSTATE BIOPSY     RIGHT HEART CATH N/A 11/13/2022   Procedure: RIGHT HEART CATH;  Surgeon: Gardenia Led, DO;  Location: [facility] INVASIVE CV LAB;  Service: Cardiovascular;  Laterality: N/A;   RIGHT HEART CATH N/A 12/21/2022   Procedure: RIGHT HEART CATH;  Surgeon: Gardenia Led, DO;  Location: [facility] INVASIVE CV LAB;  Service: Cardiovascular;  Laterality: N/A;   RIGHT HEART CATH N/A 11/25/2023   Procedure: RIGHT HEART CATH;  Surgeon: Gardenia Led, DO;  Location: [facility] INVASIVE CV LAB;  Service: Cardiovascular;  Laterality: N/A;   RIGHT HEART CATH N/A 10/01/2024   Procedure: RIGHT HEART CATH;  Surgeon: Rolan Ezra RAMAN, MD;  Location: [facility] INVASIVE CV LAB;  Service: Cardiovascular;  Laterality: N/A;   RIGHT HEART CATH N/A 10/07/2024   Procedure: RIGHT HEART CATH;  Surgeon: Cherrie Toribio SAUNDERS, MD;  Location: [facility] INVASIVE CV LAB;  Service: Cardiovascular;  Laterality: N/A;   RIGHT/LEFT HEART CATH  AND CORONARY ANGIOGRAPHY N/A 07/19/2021   Procedure: RIGHT/LEFT HEART CATH AND CORONARY ANGIOGRAPHY;  Surgeon: Verlin Lonni BIRCH, MD;  Location: [facility] INVASIVE CV LAB;  Service: Cardiovascular;  Laterality: N/A;   TEE WITHOUT CARDIOVERSION N/A 12/21/2022   Procedure: TRANSESOPHAGEAL ECHOCARDIOGRAM (TEE);  Surgeon: Gardenia Led, DO;   Location: [facility] ENDOSCOPY;  Service: Cardiovascular;  Laterality: N/A;    Assessment & Plan Clinical Impression:James [name] is a 64 year old male with PMHx of prostate cancer, CKD stage IIa, meningioma, HFrEF due to NICM (dates back to 2011 diagnosed with nonischemic cardiomyopathy), aortic root aneurysm, tobacco abuse, pAF, OSA, HTN, HLD, DM, prostate cancer s/p EBRT & ADT (radiation completed April 2025), who has been home inotrope-dependent on milrinone  0.125 mcg initial echo showed EF 10 to 15% with mod reduced RV.  Echo 7/25 showed EF 25-30%, G3DD, RV mildly reduced, moderate MR.  Initial plan was to follow-up with [facility] transplant team,however after further discussion family decided he would prefer to consider LVAD.   The patient presented on 10/01/24 for planned right heart cath and planned hospital admission to optimize for LVAD implantation. Patient underwent impella 5.5 implantation on 12/23 by Dr. Con Clunes. Explant and LVAD implantation of took place on 12/31 by Dr. Lucas of which he received 4 FFP, 2 platelets, cell saver 788 cc with EBL 1250 CC. Total bypass time 1:56. Complete TEE assessment was performed by Dr. Stoltzfus. This showed a dilated LV with EF < 20%. There was no AI, trace MR, trivial TR, fair RV systolic function and no PFO.  Patient was transferred to ICU on vasoactives, volume optimization and RHC.  Postoperatively he required milrinone  and epi postop for RV support and was diuresed and able to wean off inotropes and pressors.  Postop course complicated by A-fib, requiring TEE/DCCV.  He converted back to normal sinus rhythm and was maintained on amiodarone .  He developed ICU delirium which ultimately improved after discontinuation of trazodone . For left upper extremity weakness a head CT was completed and was negative for acute changes. Prior to admission the patient was active in the community independently and driving, he lives with his spouse in a one level home with 4 steps  to enter. Patient has been working with PT and OT and currently requires moderate assist with RW with Max A to organize and maintain attention to task. He is mod assist +2 for mobility and transfers. Therapy evaluations completed due to patient decreased functional mobility was admitted for a comprehensive rehab program.Patient transferred to CIR on 11/02/2024 .    Patient currently requires min with basic self-care skills secondary to muscle weakness, decreased cardiorespiratoy endurance, and decreased standing balance, decreased postural control, and decreased balance strategies.  Prior to hospitalization, patient could complete ADLs and transfers with independent .  Patient will benefit from skilled intervention to increase independence with basic self-care skills prior to discharge home with care partner.  Anticipate patient will require intermittent supervision and follow up outpatient.  OT - End of Session Activity Tolerance: Tolerates 10 - 20 min activity with multiple rests OT Assessment Rehab Potential (ACUTE ONLY): Good OT Barriers to Discharge: None OT Patient demonstrates impairments in the following area(s): Balance;Safety;Endurance;Motor;Skin Integrity OT Basic ADL's Functional Problem(s): Bathing;Dressing;Toileting OT Advanced ADL's Functional Problem(s): None OT Transfers Functional Problem(s): Toilet OT Additional Impairment(s): None OT Plan OT Intensity: Minimum of 1-2 x/day, 45 to 90 minutes OT Frequency: 5 out of 7 days OT Duration/Estimated Length of Stay: 5-7 days OT Treatment/Interventions: Balance/vestibular training;Discharge planning;Pain  management;Self Care/advanced ADL retraining;Therapeutic Activities;UE/LE Coordination activities;Functional mobility training;Therapeutic Exercise;DME/adaptive equipment instruction;Community reintegration;Skin care/wound managment;Patient/family education;Psychosocial support;UE/LE Strength taining/ROM OT Self Feeding Anticipated  Outcome(s): no goal OT Basic Self-Care Anticipated Outcome(s): (S) OT Toileting Anticipated Outcome(s): mod I OT Bathroom Transfers Anticipated Outcome(s): mod I OT Recommendation Recommendations for Other Services: None Patient destination: Home Follow Up Recommendations: None Equipment Recommended: 3 in 1 bedside comode   OT Evaluation Precautions/Restrictions  Precautions Precautions: Fall Precaution/Restrictions Comments: LVAD Restrictions Weight Bearing Restrictions Per Provider Order: No Other Position/Activity Restrictions: sternal precautions  Home Living/Prior Functioning Home Living Family/patient expects to be discharged to:: Private residence Living Arrangements: Spouse/significant other Available Help at Discharge: Family (wife will work 4-12 and then daughter will come in) Type of Home: House Home Access: Stairs to enter Secretary/administrator of Steps: 4 Entrance Stairs-Rails: Can reach both Home Layout: One level Bathroom Shower/Tub: Tub/shower unit, Health Visitor: Administrator Accessibility: Yes  Lives With: Spouse IADL History Homemaking Responsibilities: Yes Meal Prep Responsibility: Primary Laundry Responsibility: Primary Cleaning Responsibility: Primary Bill Paying/Finance Responsibility: Primary Shopping Responsibility: Primary Current License: Yes Mode of Transportation: Car Occupation: Full time employment Prior Function Level of Independence: Independent with basic ADLs, Independent with gait, Independent with transfers  Able to Take Stairs?: Yes Driving: Yes Vocation: Full time employment Leisure: Hobbies-yes (Comment) (hunting, fishing, gardening, mowing) Vision Baseline Vision/History: 1 Wears glasses Ability to See in Adequate Light: 0 Adequate Patient Visual Report: No change from baseline Vision Assessment?: No apparent visual deficits Perception  Perception: Within Functional Limits Praxis Praxis:  WFL Cognition Cognition Overall Cognitive Status: Within Functional Limits for tasks assessed Arousal/Alertness: Awake/alert Orientation Level: Person;Place;Situation Person: Oriented Place: Oriented Situation: Oriented Brief Interview for Mental Status (BIMS) Repetition of Three Words (First Attempt): 3 Temporal Orientation: Year: Correct Temporal Orientation: Month: Accurate within 5 days Temporal Orientation: Day: Correct Recall: Sock: Yes, no cue required Recall: Blue: Yes, no cue required Recall: Bed: Yes, no cue required BIMS Summary Score: 15 Sensation Sensation Light Touch: Appears Intact Motor  Motor Motor: Within Functional Limits  Trunk/Postural Assessment  Cervical Assessment Cervical Assessment: Within Functional Limits Thoracic Assessment Thoracic Assessment: Within Functional Limits Lumbar Assessment Lumbar Assessment: Within Functional Limits Postural Control Postural Control: Within Functional Limits  Balance Balance Balance Assessed: Yes Static Sitting Balance Static Sitting - Balance Support: Feet supported Static Sitting - Level of Assistance: 7: Independent Dynamic Sitting Balance Dynamic Sitting - Balance Support: Feet supported Dynamic Sitting - Level of Assistance: 7: Independent Static Standing Balance Static Standing - Balance Support: During functional activity;Bilateral upper extremity supported Static Standing - Level of Assistance: 5: Stand by assistance Dynamic Standing Balance Dynamic Standing - Balance Support: During functional activity;Bilateral upper extremity supported Dynamic Standing - Level of Assistance: 4: Min assist Extremity/Trunk Assessment RUE Assessment RUE Assessment: Within Functional Limits LUE Assessment LUE Assessment: Within Functional Limits  Care Tool Care Tool Self Care Eating   Eating Assist Level: Set up assist    Oral Care    Oral Care Assist Level: Set up assist    Bathing   Body parts  bathed by patient: Right arm;Left arm;Chest;Abdomen;Buttocks;Right upper leg;Left upper leg;Right lower leg;Front perineal area;Face;Left lower leg     Assist Level: Contact Guard/Touching assist    Upper Body Dressing(including orthotics)   What is the patient wearing?: Pull over shirt   Assist Level: Supervision/Verbal cueing    Lower Body Dressing (excluding footwear)   What is the patient wearing?: Pants Assist for lower body dressing: Minimal  Assistance - Patient > 75%    Putting on/Taking off footwear   What is the patient wearing?: Socks;Shoes Assist for footwear: Minimal Assistance - Patient > 75%       Care Tool Toileting Toileting activity   Assist for toileting: Minimal Assistance - Patient > 75%     Care Tool Bed Mobility Roll left and right activity   Roll left and right assist level: Supervision/Verbal cueing    Sit to lying activity   Sit to lying assist level: Supervision/Verbal cueing    Lying to sitting on side of bed activity   Lying to sitting on side of bed assist level: the ability to move from lying on the back to sitting on the side of the bed with no back support.: Supervision/Verbal cueing     Care Tool Transfers Sit to stand transfer   Sit to stand assist level: Contact Guard/Touching assist    Chair/bed transfer   Chair/bed transfer assist level: Contact Guard/Touching assist     Toilet transfer   Assist Level: Contact Guard/Touching assist     Care Tool Cognition  Expression of Ideas and Wants Expression of Ideas and Wants: 4. Without difficulty (complex and basic) - expresses complex messages without difficulty and with speech that is clear and easy to understand  Understanding Verbal and Non-Verbal Content Understanding Verbal and Non-Verbal Content: 4. Understands (complex and basic) - clear comprehension without cues or repetitions   Memory/Recall Ability Memory/Recall Ability : Current season;Location of own room;That he or she is  in a hospital/hospital unit   Refer to Care Plan for Long Term Goals  SHORT TERM GOAL WEEK 1 OT Short Term Goal 1 (Week 1): STG= LTG d/t ELOS  Recommendations for other services: Therapeutic Recreation  Stress management   Skilled Therapeutic Intervention ADL ADL Eating: Independent Where Assessed-Eating: Wheelchair Grooming: Supervision/safety Where Assessed-Grooming: Sitting at sink Upper Body Bathing: Supervision/safety Where Assessed-Upper Body Bathing: Edge of bed Lower Body Bathing: Contact guard Where Assessed-Lower Body Bathing: Edge of bed Upper Body Dressing: Supervision/safety Where Assessed-Upper Body Dressing: Edge of bed Lower Body Dressing: Minimal assistance Where Assessed-Lower Body Dressing: Edge of bed Toileting: Minimal assistance Where Assessed-Toileting: Bedside Commode Toilet Transfer: Furniture Conservator/restorer Method: Ambulating Tub/Shower Transfer: Unable to assess Tub/Shower Transfer Method: Unable to assess Film/video Editor: Unable to assess Mobility  Bed Mobility Bed Mobility: Sit to Supine;Supine to Sit Supine to Sit: Supervision/Verbal cueing Sit to Supine: Supervision/Verbal cueing Transfers Sit to Stand: Contact Guard/Touching assist Stand to Sit: Contact Guard/Touching assist   Skilled OT evaluation completed with the creation of pt centered OT POC. Pt educated on condition, ELOS, rehab expectations, and fall risk reduction strategies throughout session. 15 min missed d/t pt stating he needed to take care of business before session began despite OT edu on not missing therapy gym. ADLs performed EOB as described above. Pt used Rw for functional mobility around room to simulate mobility in the home, CGA. Anticipate 5-7 days LOS to reach (S)- mod I goals. Pt very limited by endurance deficits. He was left sitting up in the recliner at close of session.   Discharge Criteria: Patient will be discharged from OT if patient refuses  treatment 3 consecutive times without medical reason, if treatment goals not met, if there is a change in medical status, if patient makes no progress towards goals or if patient is discharged from hospital.  The above assessment, treatment plan, treatment alternatives and goals were discussed and mutually agreed upon:  by patient  [name] 11/03/2024, 10:30 AM

## 2024-11-03 NOTE — Progress Notes (Addendum)
 Patient ID: James [name], male   DOB: [birth year], 64 y.o.   MRN: [id number]     Advanced Heart Failure Rounding Note   [facility] Cardiologist: Dr. Zenaida Chief Complaint: Post-op LVAD Patient Profile   James [name] is a 64 y.o. male with history of stage D cardiomyopathy/chronic systolic heart failure now end-stage w/ inotrope dependence, CKD IIIa, hx VT, PAF, prostate cancer, meningioma.   Admitted for LVAD implant/ pre-VAD HF optimization.   Significant events:   12/18: Milrinone  titrated from 0.25 to 0.5 mcg/kg/min post RHC d/t low-output and pulmonary hypertension 12/19: NE added 12/22: Markedly elevated PA pressures, elevated PCWP and elevated SVR. Titrated off NE. Added DBA.  12/23: Impella 5.5 Implanted.  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 Heparin  > Bival.  12/31: HM III LVAD implant 1/5: LVAD speed increased to 5400  1/6: LUE weakness, head CT negative  1/9: Ramp echo, speed increased to 5500 rpm 1/11: Off milrinone . 1/12: bedside TEE/DCCV. EF 20%, mod/sev HK. VAD speed decreased to 5400 1/14: transferred out of CCU  1/15: repeat head CT negative for acute changes   Subjective:    S/P HM3 LVAD 10/14/24.  Feeling well. Had BM overnight. Working with PT. No complaints other than he is hungry.   LVAD INTERROGATION:  HeartMate IIl LVAD:  Flow 5.2 liters/min, speed 5400  power 4 PI 3.1 VAD interrogated personally. Parameters stable.  Objective:    Weight Range: 91 kg Body mass index is 23.19 kg/m.   Vital Signs:   Temp:  [97.8 F (36.6 C)-98.6 F (37 C)] 98.6 F (37 C) (01/19 2019) Pulse Rate:  [66-84] 66 (01/19 2019) Resp:  [16-19] 16 (01/19 2019) BP: (87-100)/(70-86) 100/82 (01/20 0400) SpO2:  [97 %-99 %] 99 % (01/19 2019) Weight:  [91 kg] 91 kg (01/20 0600) Last BM Date : 11/02/24  Doppler MAP: 80s  Weight change: Filed Weights   11/03/24 0600  Weight: 91 kg   Intake/Output:  Intake/Output Summary (Last 24 hours) at 11/03/2024 0951 Last data filed at  11/03/2024 0500 Gross per 24 hour  Intake 177 ml  Output 500 ml  Net -323 ml    Physical Exam   General: Well appearing. No distress on RA Cardiac: JVP flat. Mechanical heart sounds with LVAD hum present.  Driveline: Dressing C/D/I. No drainage or redness. Anchor in place. Extremities: Warm and dry. No edema. Neuro: Alert and oriented x3. Affect pleasant. Moves all extremities without difficulty.  Labs   CBC Recent Labs    11/02/24 0553 11/03/24 0459  WBC 8.6 9.1  NEUTROABS  --  7.5  HGB 10.7* 11.2*  HCT 33.0* 33.9*  MCV 96.2 95.2  PLT 202 211   Basic Metabolic Panel Recent Labs    98/81/73 0500 11/02/24 0554 11/03/24 0459  NA 135 131* 137  K 4.6 4.2 4.6  CL 99 98 101  CO2 25 26 26   GLUCOSE 113* 110* 101*  BUN 21 22 22   CREATININE 1.12 1.08 1.17  CALCIUM  8.5* 8.5* 8.7*  PHOS 3.0 2.7  --    Liver Function Tests Recent Labs    11/02/24 0554 11/03/24 0459  AST  --  32  ALT  --  47*  ALKPHOS  --  114  BILITOT  --  0.9  PROT  --  6.3*  ALBUMIN  3.1* 3.1*   BNP (last 3 results) Recent Labs    06/10/24 1213 07/09/24 1649 07/23/24 0959  BNP 1,071.5* 3,316.0* 595.8*   ProBNP (last 3 results) Recent Labs  10/15/24 0400 10/21/24 0433 10/28/24 0559  PROBNP 9,508.0* 5,277.0* 8,170.0*   Medications:    Scheduled Medications:  amiodarone   200 mg Oral BID   atorvastatin   40 mg Oral Daily   Chlorhexidine  Gluconate Cloth  6 each Topical Daily   collagenase    Topical Daily   digoxin   0.0625 mg Oral Daily   furosemide   40 mg Oral Daily   gabapentin   200 mg Oral QHS   insulin  aspart  0-15 Units Subcutaneous TID WC   lactose free nutrition  237 mL Oral Daily   mexiletine  150 mg Oral Q12H   multivitamin with minerals  1 tablet Oral Daily   pantoprazole   40 mg Oral Daily   potassium chloride   40 mEq Oral Daily   senna-docusate  2 tablet Oral QHS   sildenafil   20 mg Oral TID   sodium chloride  flush  10-40 mL Intracatheter Q12H   spironolactone   50 mg  Oral BID   warfarin  4 mg Oral ONCE-1600   Warfarin - Pharmacist Dosing Inpatient   Does not apply q1600   zinc  sulfate (50mg  elemental zinc )  220 mg Oral Q supper    Infusions:    PRN Medications: acetaminophen , alum & mag hydroxide-simeth, bisacodyl , diphenhydrAMINE , guaiFENesin -dextromethorphan , mouth rinse, oxyCODONE , phenol, prochlorperazine  **OR** prochlorperazine  **OR** prochlorperazine , senna, sodium phosphate  Assessment/Plan   Acute on chronic systolic CHF, NYHA IV: Nonischemic cardiomyopathy. RHC 12/18 showed elevated filling pressures with mod-severe mixed pulmonary arterial/pulmonary venous hypertension and low CO despite milrinone  0.25, increased to 0.5. Impella 5.5 placed 12/23 for further optimization. - S/p HM III LVAD implant by Dr. Lucas 10/14/24. - iNO off 1/1; Milrinone  off 1/11. Co-ox stable.  - Ramp echo 1/9, increased speed to 5500 rpm. TEE 1/12 decreased speed to 5400. - Volume ok. Continue Lasix  40 mg daily.  - Continue sildenafil  20 mg TID for RV support - Continue digoxin  0.0625 mg daily. Dig level 0.9 1/13 - Continue spironolactone  50 mg bid - MAPs 80s - INR 2.0. Discussed warfarin dosing with PharmD personally.  PVCs/NSVT: Improved. Has Biotronik ICD. Tachy therapies turned on  - Continue mexiletine to 150 mg bid.   Persistent Atrial fibrillation:  - S/P DCCV 1/12. Remains in NSR - Continue amio 200 mg bid - on warfarin  Pulmonary hypertension: Severe combined pre/post capillary PH on RHC - resolved post VAD  CKD stage 3:  - Scr stable  Hypokalemia/ Hypomagnesemia  - supp PRN   HLD: continue atorvastatin  40 mg daily  Meningioma: Monitoring.   Prostate cancer: Treated with radiation.   Deconditioning: Per CIR team   [name], NP 11/03/24, 9:51 AM  Advanced Heart Failure Team Pager [phone] (M-F; 7a - 5p)    Please visit [email or web address]: For overnight coverage please call cardiology fellow first. If fellow not available call  Shock/ECMO MD on call.  For ECMO / Mechanical Support (Impella, IABP, LVAD) issues call Shock / ECMO MD on call.   Patient seen and examined with the above-signed Advanced Practice Provider and/or Housestaff. I personally reviewed laboratory data, imaging studies and relevant notes. I independently examined the patient and formulated the important aspects of the plan. I have edited the note to reflect any of my changes or salient points. I have personally discussed the plan with the patient and/or family.  He is doing well. Working with PT. Ambulating hall.   General:  NAD.  HEENT: normal  Neck: supple. JVP not elevated.  Carotids 2+ bilat; no bruits. No lymphadenopathy or thryomegaly  appreciated. Cor: LVAD hum.  Lungs: Clear. Abdomen: soft, nontender, non-distended. No hepatosplenomegaly. No bruits or masses. Good bowel sounds. Driveline site clean. Anchor in place.  Extremities: no cyanosis, clubbing, rash. Warm no edema  Neuro: alert & oriented x 3. No focal deficits. Moves all 4 without problem   Doing well with rehab. INR 2.1 Volume ok.   VAD interrogated personally. Parameters stable.  Continue current plan. Appreciate CIR's care.  Toribio Fuel, MD  2:02 PM

## 2024-11-03 NOTE — Progress Notes (Signed)
 " Inpatient Rehabilitation Care Coordinator Assessment and Plan Patient Details  Name: James [name] MRN: [id number] Date of Birth: 1961-08-02  Today's Date: 11/03/2024  Hospital Problems: Principal Problem:   Debility  Past Medical History:  Past Medical History:  Diagnosis Date   CKD (chronic kidney disease) stage 2, GFR 60-89 ml/min    COPD (chronic obstructive pulmonary disease) (HCC)    Diabetes mellitus type II, non insulin  dependent (HCC)    Dilated aortic root    Elevated PSA    Hypercholesteremia    Hypertension    NICM (nonischemic cardiomyopathy) (HCC)    NSVT (nonsustained ventricular tachycardia) (HCC)    Obstructive sleep apnea    Paroxysmal atrial fibrillation (HCC)    Pulmonary hypertension (HCC)    PVC's (premature ventricular contractions)    Tobacco abuse    Past Surgical History:  Past Surgical History:  Procedure Laterality Date   CARDIAC CATHETERIZATION  10/15/2000   COLONOSCOPY N/A 03/22/2014   Procedure: COLONOSCOPY;  Surgeon: Margo LITTIE Haddock, MD;  Location: [facility];  Service: Endoscopy;  Laterality: N/A;  11:15 AM   ICD IMPLANT N/A 08/21/2021   Procedure: ICD IMPLANT;  Surgeon: Waddell Danelle ORN, MD;  Location: [facility] INVASIVE CV LAB;  Service: Cardiovascular;  Laterality: N/A;   INGUINAL HERNIA REPAIR Right 06/24/2017   Procedure: HERNIA REPAIR INGUINAL ADULT WITH MESH;  Surgeon: Mavis Anes, MD;  Location: [facility] ORS;  Service: General;  Laterality: Right;   INSERTION OF IMPLANTABLE LEFT VENTRICULAR ASSIST DEVICE N/A 10/14/2024   Procedure: INSERTION OF IMPLANTABLE LEFT VENTRICULAR ASSIST DEVICE HEARTMATE 3; REMOVAL OF IMPELLA 5.5;  Surgeon: Lucas Dorise POUR, MD;  Location: [facility] OR;  Service: Open Heart Surgery;  Laterality: N/A;   INTRAOPERATIVE TRANSESOPHAGEAL ECHOCARDIOGRAM N/A 10/06/2024   Procedure: ECHOCARDIOGRAM, TRANSESOPHAGEAL, INTRAOPERATIVE;  Surgeon: Daniel Con RAMAN, MD;  Location: [facility] OR;  Service: Open Heart Surgery;  Laterality: N/A;    INTRAOPERATIVE TRANSESOPHAGEAL ECHOCARDIOGRAM N/A 10/14/2024   Procedure: ECHOCARDIOGRAM, TRANSESOPHAGEAL, INTRAOPERATIVE;  Surgeon: Lucas Dorise POUR, MD;  Location: [facility] OR;  Service: Open Heart Surgery;  Laterality: N/A;   IR TUNNELED CENTRAL VENOUS CATH PLC W IMG  07/17/2024   PLACEMENT OF IMPELLA LEFT VENTRICULAR ASSIST DEVICE N/A 10/06/2024   Procedure: INSERTION, CARDIAC ASSIST DEVICE, IMPELLA 5.5;  Surgeon: Daniel Con RAMAN, MD;  Location: [facility] OR;  Service: Open Heart Surgery;  Laterality: N/A;   PROSTATE BIOPSY     RIGHT HEART CATH N/A 11/13/2022   Procedure: RIGHT HEART CATH;  Surgeon: Gardenia Led, DO;  Location: [facility] INVASIVE CV LAB;  Service: Cardiovascular;  Laterality: N/A;   RIGHT HEART CATH N/A 12/21/2022   Procedure: RIGHT HEART CATH;  Surgeon: Gardenia Led, DO;  Location: [facility] INVASIVE CV LAB;  Service: Cardiovascular;  Laterality: N/A;   RIGHT HEART CATH N/A 11/25/2023   Procedure: RIGHT HEART CATH;  Surgeon: Gardenia Led, DO;  Location: [facility] INVASIVE CV LAB;  Service: Cardiovascular;  Laterality: N/A;   RIGHT HEART CATH N/A 10/01/2024   Procedure: RIGHT HEART CATH;  Surgeon: Rolan Ezra RAMAN, MD;  Location: [facility] INVASIVE CV LAB;  Service: Cardiovascular;  Laterality: N/A;   RIGHT HEART CATH N/A 10/07/2024   Procedure: RIGHT HEART CATH;  Surgeon: Cherrie Toribio SAUNDERS, MD;  Location: [facility] INVASIVE CV LAB;  Service: Cardiovascular;  Laterality: N/A;   RIGHT/LEFT HEART CATH AND CORONARY ANGIOGRAPHY N/A 07/19/2021   Procedure: RIGHT/LEFT HEART CATH AND CORONARY ANGIOGRAPHY;  Surgeon: Verlin Lonni BIRCH, MD;  Location: [facility] INVASIVE CV LAB;  Service: Cardiovascular;  Laterality: N/A;   TEE WITHOUT CARDIOVERSION N/A 12/21/2022   Procedure: TRANSESOPHAGEAL ECHOCARDIOGRAM (TEE);  Surgeon: Gardenia Led, DO;  Location: [facility] ENDOS[facility];  Service: Cardiovascular;  Laterality: N/A;   Social History:  reports that he quit smoking about 3 years ago. His smoking use included cigarettes. He  started smoking about 42 years ago. He has a 19.4 pack-year smoking history. He has never used smokeless tobacco. He reports that he does not drink alcohol and does not use drugs.  Family / Support Systems Marital Status: Married How Long?: 16 years Patient Roles: Spouse, Parent Spouse/Significant Other: [name] (wifE) Children: Blended family; no children together. Pt has 4 children. States his dtr Madolyn will help him. Other Supports: none reported Anticipated Caregiver: Wife and dtr Ability/Limitations of Caregiver: Wife and dtr will help with prvoding care as they will alternate care in between work schedules. Caregiver Availability: 24/7 Family Dynamics: Pt lives with his wife.  Social History Preferred language: English Religion: Christian Cultural Background: Pt worked as merchandiser, retail at a Education [employer], [employer]: high school grad Primary School Teacher - How often do you need to have someone help you when you read instructions, pamphlets, or other written material from your doctor or pharmacy?: Never Writes: Yes Employment Status: Employed Return to Work Plans: TBD. Pt reports he is receiving short term disability and long term disablity. He has been encouarged to confirm which he is receiving and if we submit a referral to [facility] for SSDI it will not impact anything. Legal History/Current Legal Issues: Denies Guardian/Conservator: Dneies   Abuse/Neglect Abuse/Neglect Assessment Can Be Completed: Yes Physical Abuse: Denies Verbal Abuse: Denies Sexual Abuse: Denies Exploitation of patient/patient's resources: Denies Self-Neglect: Denies  Patient response to: Social Isolation - How often do you feel lonely or isolated from those around you?: Never  Emotional Status Pt's affect, behavior and adjustment status: Pt in good spirits at time of visit Recent Psychosocial Issues: Denies Psychiatric History: Denies Substance Abuse History: Pt reports he quit smoking  cigarettes two yeas ago for health resaons. He denies any rec and etoh use.  Patient / Family Perceptions, Expectations & Goals Pt/Family understanding of illness & functional limitations: Pt has genera understanding of care needs Premorbid pt/family roles/activities: Independent Anticipated changes in roles/activities/participation: Assistance with ADLs/IADLs Pt/family expectations/goals: Pt goal is to work on his legs,and feels this happened after therapt earlier this morning.  Community Resources [employer]: None Premorbid Home Care/DME Agencies: None Transportation available at discharge: TBD Is the patient able to respond to transportation needs?: Yes In the past 12 months, has lack of transportation kept you from medical appointments or from getting medications?: No In the past 12 months, has lack of transportation kept you from meetings, work, or from getting things needed for daily living?: No Resource referrals recommended: Neuropsychology  Discharge Planning Living Arrangements: Spouse/significant other Support Systems: Spouse/significant other Type of Residence: Private residence Insurance Resources: Media Planner (specify) HERBALIST) Financial Resources: Employment Surveyor, Quantity Screen Referred: No Living Expenses: Banker Management: Patient, Spouse Does the patient have any problems obtaining your medications?: No Home Management: Both he and wife manage home care needs Patient/Family Preliminary Plans: TBD Care Coordinator Barriers to Discharge: Decreased caregiver support, Lack of/limited family support, Insurance for SNF coverage Care Coordinator Anticipated Follow Up Needs: HH/OP Expected length of stay: D/c 1/24  Clinical Impression SW met with pt in room with pt sister and he husband. SW introduced self, explained role, discuss discharge process, informed on d/c dare 1/24, will confirm DME-  3in1 BSC anda RW,and he will have cardiac rehab ordered by  cardiology when he is appropriate for therapy. He is not a cytogeneticist. No HCPOA. He is aware SW will follow-up with his wife.   1627- SW left message for pt wife informing on above.   Graeme DELENA Jude 11/03/2024, 4:37 PM    "

## 2024-11-03 NOTE — Progress Notes (Addendum)
 LVAD Coordinator Rounding Note:  Admitted 10/01/24 for optimization prior to LVAD implant. Impella 5.5 placed 10/06/24.  HM 3 LVAD implanted on 10/14/24 by Dr Lucas under destination therapy criteria.  12/18: Milrinone  titrated from 0.25 to 0.5 mcg/kg/min post RHC d/t low-output and pulmonary hypertension 12/19: NE added 12/22: Markedly elevated PA pressures, elevated PCWP and elevated SVR. Titrated off NE. Added DBA.  12/23: Impella 5.5 Implanted.  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 Heparin  > Bival.  12/31: HM III LVAD implant 1/5: LVAD speed increased to 5400  1/6: LUE weakness, head CT negative  1/9: Ramp echo, speed increased to 5500 rpm 1/11: Off milrinone . 1/12: bedside TEE/DCCV. EF 20%, mod/sev HK. VAD speed decreased to 5400 14: transferred out of CCU  1/15: repeat head CT negative for acute changes. 1/19: Discharged to CIR  Pt sitting up in the recliner after therapy this morning. Tells me he is feeling better after having bowel movements yesterday. Denies surgical pain. States his bottom is sore at area of pressure wound.  Pt continues to occasionally say things that do not make sense and he needs constant reinforcement.   Discharge education completed 10/28/24. Ongoing driveline dressing change teaching with pt's wife. Left voicemail for [name] to discuss plan for dressing change teaching tomorrow.   Home VAD equipment ordered 1/19.  Vital signs: Temp: 98.6 HR: 76 Doppler Pressure: 83 Automatic BP: 105/78 (88) O2 Sat: 99% on RA Wt: 235.4>244.3>243.4>234.8>223.5>239.6>220.2>224.2>214>210.9>214>200.8>200.6 lb    LVAD interrogation reveals:  Speed: 5400 Flow: 4.5 Power: 3.8 w PI: 5.9   Alarms: none Events: none Hematocrit: 33  Fixed speed: 5400 Low speed limit: 5100   Drive Line: Existing VAD dressing clean, dry, & intact. Cath grip anchor correctly applied. Continue MWF dressing changes by VAD coordinator or nurse champion. Next dressing change due 11/04/24 by VAD  coordinator or nurse champion.  Labs:  LDH trend: [phone]  INR trend: 1.3>1.4>1.4>1.6>1.7>1.7>2.0>2.1>2.2>2.3>2.2>1.8>1.7>2.0  Anticoagulation Plan: - INR Goal: 2.0 - 2.5 - Coumadin  dosing per pharmacy  Blood Products:  Intra-op 10/14/24:   4 FFP  2 platelets  700 cellsaver  DDAVP  x 2 Post-op:  10/15/24: 1 PRBC  Device: - Biotronik -Therapies: ON 10/29/24  Arrythmias: cardioverted from afib on 10/26/24  Respiratory: extubated 10/15/24  Infection:   Drips:  Milrinone  0.125 mcg/kg/min-off Amiodarone  30 mg/hr-off  Adverse Events on VAD:  Patient Education:  Completed 10/28/24. See separate note for details Ongoing dressing change teaching with pt's wife [name]  Plan/Recommendations:  1. Page VAD coordinator for any equipment or drive line concerns 2. Continue Monday, Wednesday, Friday drive line dressing changes by VAD coordinator or nurse champion  Isaiah Knoll RN VAD Coordinator  Office: [phone]  24/7 Pager: [phone]

## 2024-11-03 NOTE — Progress Notes (Signed)
 Physical Therapy Session Note  Patient Details  Name: Gerhard Rappaport MRN: [id number] Date of Birth: [birth year]  Today's Date: 11/03/2024 PT Missed Time: 75 Minutes Missed Time Reason: Patient fatigue  Short Term Goals: Week 1:  PT Short Term Goal 1 (Week 1): STGs = LTGs  Skilled Therapeutic Interventions/Progress Updates:  Ambulation/gait training;Community reintegration;DME/adaptive equipment instruction;Neuromuscular re-education;Psychosocial support;Stair training;UE/LE Strength taining/ROM;Balance/vestibular training;Discharge planning;Therapeutic Activities;UE/LE Coordination activities;Cognitive remediation/compensation;Functional mobility training;Patient/family education;Therapeutic Exercise;Pain management   Pt received supine in bed. Pt politely declines therapy at this time due to fatigue. PT explains expectations of rehab. PT will follow up as able.   Therapy Documentation Precautions:  Precautions Precautions: Fall Precaution/Restrictions Comments: LVAD Restrictions Weight Bearing Restrictions Per Provider Order: No RUE Weight Bearing Per Provider Order: Non weight bearing LUE Weight Bearing Per Provider Order: Non weight bearing Other Position/Activity Restrictions: sternal precautions General: Chart Reviewed: Yes Family/Caregiver Present: No   Therapy/Group: Individual Therapy  [name], PT, DPT 11/03/2024, 2:38 PM

## 2024-11-03 NOTE — Progress Notes (Signed)
 Initial Nutrition Assessment  DOCUMENTATION CODES:   Not applicable  INTERVENTION:  Double protein at meals  Discontinued boost plus, pt states it is contributing to constipation  Encouraged adequate intake of meals to help meet increased nutrient needs for wound healing  NUTRITION DIAGNOSIS:   Increased nutrient needs related to wound healing, post-op healing as evidenced by estimated needs.  GOAL:   Patient will meet greater than or equal to 90% of their needs  MONITOR:   PO intake, Supplement acceptance  REASON FOR ASSESSMENT:   Consult Wound healing  ASSESSMENT:   Pt with hx of endstage heart failure, CKD 3a, meningioma, and prostate cancer. Recent admission for LVAD implantation. Post op complicated by atrial fibrillation s/p TEE and delirium. Admitted to CIR to work functional mobility.  Hospital Events: 12/23 Impella implanted 12/31 LVAD implantation 1/12 TEE  Pt resting in bedside chair at time of assessment. Pt reports his appetite is continuing to improve each day. Pt followed by RD team during in patient stay and has been doing well w/ previous interventions including double protein at meals. Pt requests discontinuing Boost Plus as it is causing constipation. Encouraged pt to eat well at all meals and utilize floor ONS/ snacks if needed in between meals. Discussed pt's high calorie and protein needs for wound healing and post op recovery. Pt reports no GI discomforts at this time, states miralax  has been working well to maintain regular bowel movements.   PTA, pt reports very poor appetite. Pt reports only eating 1-2 x per day. Pt states when he had the appetite to eat he would eat take out but most foods did not seem appetizing to him. Pt felt like appetite improved following LVAD placement.    Pt has been choosing foods that do not spike glucose levels too much and tries to avoid sweets on trays. Pt does not feel like he needs carb mod diet since he is very  involved in choosing his meals and understands his biggest triggers for glucose spikes. Pt's blood sugar appears well managed at this time, but will continue to monitor. Encouraged high protein intake to also manage diabetes, pt understands.  Recommend continued vitamin/ mineral supplementation to support wound healing.   Nutrition focused physical exam shows some mild muscle depletions but otherwise pt appears well nourished at baseline. Suspect muscle depletions related to atrophy from prolonged hospitalization for LVAD workup. Per chart review, pt has lost 13% weight in 1.5 months but previous RD noted significant weight fluctuations related to fluid retention during admission. Will continue to monitor.   Average Meal Completion: 1/19-1/20: 70% average intake x 3 recorded meals  Nutritionally Relevant Medications: Lasix  SSI 0-15 units TID MVI w/ minerals Protonix  Potassium chloride  40 mEq Senna Spironolactone   Zinc  sulfate   Labs reviewed: CBG x 24 h: 102-188 mg/dL J8r 6.0  Admit weight: 91.1 kg   NUTRITION - FOCUSED PHYSICAL EXAM:  Flowsheet Row Most Recent Value  Orbital Region No depletion  Upper Arm Region Mild depletion  Thoracic and Lumbar Region Unable to assess  Buccal Region No depletion  Temple Region Mild depletion  Clavicle Bone Region Mild depletion  Clavicle and Acromion Bone Region Mild depletion  Scapular Bone Region Mild depletion  Dorsal Hand Mild depletion  Patellar Region Mild depletion  Anterior Thigh Region Mild depletion  Posterior Calf Region Mild depletion  Edema (RD Assessment) None  Hair Reviewed  Eyes Reviewed  Mouth Reviewed  Skin Reviewed  Nails Reviewed    Diet Order:  Diet Order             Diet regular Room service appropriate? Yes; Fluid consistency: Thin  Diet effective now                   EDUCATION NEEDS:   Education needs have been addressed  Skin:  Skin Integrity Issues:: DTI DTI: sacrum Incisions: chest,  sternum  Last BM:  1/20 type 6  Height:   Ht Readings from Last 1 Encounters:  11/02/24 6' 6 (1.981 m)    Weight:   Wt Readings from Last 1 Encounters:  11/03/24 91 kg    BMI:  Body mass index is 23.19 kg/m.  Estimated Nutritional Needs:   Kcal:  2300-2500  Protein:  130-150g  Fluid:  >2L    Josette Glance, MS, RDN, LDN Clinical Dietitian I Please reach out via secure chat

## 2024-11-03 NOTE — Evaluation (Signed)
 Physical Therapy Assessment and Plan  Patient Details  Name: James [name] MRN: [id number] Date of Birth: 1961-08-30  PT Diagnosis: Difficulty walking and Muscle weakness Rehab Potential: Good ELOS: 5-7 Days   Today's Date: 11/03/2024 PT Individual Time: 1105-1205 PT Individual Time Calculation (min): 60 min    Hospital Problem: Principal Problem:   Debility   Past Medical History:  Past Medical History:  Diagnosis Date   CKD (chronic kidney disease) stage 2, GFR 60-89 ml/min    COPD (chronic obstructive pulmonary disease) (HCC)    Diabetes mellitus type II, non insulin  dependent (HCC)    Dilated aortic root    Elevated PSA    Hypercholesteremia    Hypertension    NICM (nonischemic cardiomyopathy) (HCC)    NSVT (nonsustained ventricular tachycardia) (HCC)    Obstructive sleep apnea    Paroxysmal atrial fibrillation (HCC)    Pulmonary hypertension (HCC)    PVC's (premature ventricular contractions)    Tobacco abuse    Past Surgical History:  Past Surgical History:  Procedure Laterality Date   CARDIAC CATHETERIZATION  10/15/2000   COLONOSCOPY N/A 03/22/2014   Procedure: COLONOSCOPY;  Surgeon: Margo LITTIE Haddock, MD;  [facility]: [facility];  Service: Endoscopy;  Laterality: N/A;  11:15 AM   ICD IMPLANT N/A 08/21/2021   Procedure: ICD IMPLANT;  Surgeon: Waddell Danelle ORN, MD;  Location: [facility] INVASIVE CV LAB;  Service: Cardiovascular;  Laterality: N/A;   INGUINAL HERNIA REPAIR Right 06/24/2017   Procedure: HERNIA REPAIR INGUINAL ADULT WITH MESH;  Surgeon: Mavis Anes, MD;  Location: [facility] ORS;  Service: General;  Laterality: Right;   INSERTION OF IMPLANTABLE LEFT VENTRICULAR ASSIST DEVICE N/A 10/14/2024   Procedure: INSERTION OF IMPLANTABLE LEFT VENTRICULAR ASSIST DEVICE HEARTMATE 3; REMOVAL OF IMPELLA 5.5;  Surgeon: Lucas Dorise POUR, MD;  Location: [facility] OR;  Service: Open Heart Surgery;  Laterality: N/A;   INTRAOPERATIVE TRANSESOPHAGEAL ECHOCARDIOGRAM N/A 10/06/2024   Procedure:  ECHOCARDIOGRAM, TRANSESOPHAGEAL, INTRAOPERATIVE;  Surgeon: Daniel Con RAMAN, MD;  Location: [facility] OR;  Service: Open Heart Surgery;  Laterality: N/A;   INTRAOPERATIVE TRANSESOPHAGEAL ECHOCARDIOGRAM N/A 10/14/2024   Procedure: ECHOCARDIOGRAM, TRANSESOPHAGEAL, INTRAOPERATIVE;  Surgeon: Lucas Dorise POUR, MD;  Location: [facility] OR;  Service: Open Heart Surgery;  Laterality: N/A;   IR TUNNELED CENTRAL VENOUS CATH PLC W IMG  07/17/2024   PLACEMENT OF IMPELLA LEFT VENTRICULAR ASSIST DEVICE N/A 10/06/2024   Procedure: INSERTION, CARDIAC ASSIST DEVICE, IMPELLA 5.5;  Surgeon: Daniel Con RAMAN, MD;  Location: [facility] OR;  Service: Open Heart Surgery;  Laterality: N/A;   PROSTATE BIOPSY     RIGHT HEART CATH N/A 11/13/2022   Procedure: RIGHT HEART CATH;  Surgeon: Gardenia Led, DO;  [facility]: [facility] INVASIVE CV LAB;  Service: Cardiovascular;  Laterality: N/A;   RIGHT HEART CATH N/A 12/21/2022   Procedure: RIGHT HEART CATH;  Surgeon: Gardenia Led, DO;  [facility]: [facility] INVASIVE CV LAB;  Service: Cardiovascular;  Laterality: N/A;   RIGHT HEART CATH N/A 11/25/2023   Procedure: RIGHT HEART CATH;  Surgeon: Gardenia Led, DO;  [facility] INVAS[facility] CV LAB;  Service: Cardiovascular;  Laterality: N/A;   RIGHT HEART CATH N/A 10/01/2024   Procedure: RIGHT HEART CATH;  Surgeon: Rolan Ezra RAMAN, MD;  Location: [facility] INVASIVE CV LAB;  Service: Cardiovascular;  Laterality: N/A;   RIGHT HEART CATH N/A 10/07/2024   Procedure: RIGHT HEART CATH;  Surgeon: Cherrie Toribio SAUNDERS, MD;  [facility]: [facility] INVAS[facility] CV LAB;  Service: Cardiovascular;  Laterality: N/A;   RIGHT/LEFT HEART CATH AND CORONARY ANGIOGRAPHY  N/A 07/19/2021   Procedure: RIGHT/LEFT HEART CATH AND CORONARY ANGIOGRAPHY;  Surgeon: Verlin Lonni BIRCH, MD;  Location: [facility] INVASIVE CV LAB;  Service: Cardiovascular;  Laterality: N/A;   TEE WITHOUT CARDIOVERSION N/A 12/21/2022   Procedure: TRANSESOPHAGEAL ECHOCARDIOGRAM (TEE);  Surgeon: Gardenia Led, DO;  Location: [facility] ENDOS[facility];   Service: Cardiovascular;  Laterality: N/A;    Assessment & Plan Clinical Impression: Patient is a 64 year old male with PMHx of prostate cancer, CKD stage IIa, meningioma, HFrEF due to NICM (dates back to 2011 diagnosed with nonischemic cardiomyopathy), aortic root aneurysm, tobacco abuse, pAF, OSA, HTN, HLD, DM, prostate cancer s/p EBRT & ADT (radiation completed April 2025), who has been home inotrope-dependent on milrinone  0.125 mcg initial echo showed EF 10 to 15% with mod reduced RV.  Echo 7/25 showed EF 25-30%, G3DD, RV mildly reduced, moderate MR.  Initial plan was to follow-up with [facility] transplant team,however after further discussion family decided he would prefer to consider LVAD.   The patient presented on 10/01/24 for planned right heart cath and planned hospital admission to optimize for LVAD implantation. Patient underwent impella 5.5 implantation on 12/23 by Dr. Con Clunes. Explant and LVAD implantation of took place on 12/31 by Dr. Lucas of which he received 4 FFP, 2 platelets, cell saver 788 cc with EBL 1250 CC. Total bypass time 1:56. Complete TEE assessment was performed by Dr. Stoltzfus. This showed a dilated LV with EF < 20%. There was no AI, trace MR, trivial TR, fair RV systolic function and no PFO.  Patient was transferred to ICU on vasoactives, volume optimization and RHC. Postoperatively he required milrinone  and epi postop for RV support and was diuresed and able to wean off inotropes and pressors.  Postop course complicated by A-fib, requiring TEE/DCCV.  He converted back to normal sinus rhythm and was maintained on amiodarone .  He developed ICU delirium which ultimately improved after discontinuation of trazodone . For left upper extremity weakness a head CT was completed and was negative for acute changes. Prior to admission the patient was active in the community independently and driving, he lives with his spouse in a one level home with 4 steps to enter. Patient has been working  with PT and OT and currently requires moderate assist with RW with Max A to organize and maintain attention to task. He is mod assist +2 for mobility and transfers.   Patient transferred to CIR on 11/02/2024 .   Patient currently requires min with mobility secondary to muscle weakness, decreased cardiorespiratoy endurance, and decreased standing balance, decreased postural control, decreased balance strategies, and difficulty maintaining precautions.  Prior to hospitalization, patient was independent  with mobility and lived with Spouse in a House home.  Home access is 4Stairs to enter.  Patient James benefit from skilled PT intervention to maximize safe functional mobility, minimize fall risk, and decrease caregiver burden for planned discharge home with intermittent assist.  Anticipate patient James benefit from follow up OP at discharge.  PT - End of Session Activity Tolerance: Tolerates 30+ min activity with multiple rests PT Assessment Rehab Potential (ACUTE/IP ONLY): Good PT Patient demonstrates impairments in the following area(s): Balance;Endurance;Motor;Pain;Safety PT Transfers Functional Problem(s): Bed Mobility;Bed to Chair;Car PT Locomotion Functional Problem(s): Ambulation;Stairs PT Plan PT Intensity: Minimum of 1-2 x/day ,45 to 90 minutes PT Frequency: 5 out of 7 days PT Duration Estimated Length of Stay: 5-7 Days PT Treatment/Interventions: Ambulation/gait training;Community reintegration;DME/adaptive equipment instruction;Neuromuscular re-education;Psychosocial support;Stair training;UE/LE Strength taining/ROM;Balance/vestibular training;Discharge planning;Therapeutic Activities;UE/LE Coordination activities;Cognitive remediation/compensation;Functional mobility training;Patient/family  education;Therapeutic Exercise;Pain management PT Transfers Anticipated Outcome(s): Supervision PT Locomotion Anticipated Outcome(s): Supervision PT Recommendation Recommendations for Other Services:  None Follow Up Recommendations: Outpatient PT Patient destination: Home Equipment Recommended: To be determined   PT Evaluation Precautions/Restrictions Precautions Precautions: Fall Precaution/Restrictions Comments: LVAD Restrictions Weight Bearing Restrictions Per Provider Order: No Other Position/Activity Restrictions: sternal precautions General Chart Reviewed: Yes Family/Caregiver Present: No  Pain Interference Pain Interference Pain Effect on Sleep: 1. Rarely or not at all Pain Interference with Therapy Activities: 1. Rarely or not at all Pain Interference with Day-to-Day Activities: 1. Rarely or not at all Home Living/Prior Functioning   Vision/Perception  Vision - History Ability to See in Adequate Light: 0 Adequate Perception Perception: Within Functional Limits Praxis Praxis: WFL  Cognition Overall Cognitive Status: Within Functional Limits for tasks assessed Arousal/Alertness: Awake/alert Sensation Sensation Light Touch: Appears Intact Motor  Motor Motor: Within Functional Limits  Trunk/Postural Assessment  Cervical Assessment Cervical Assessment: Within Functional Limits Thoracic Assessment Thoracic Assessment: Within Functional Limits Lumbar Assessment Lumbar Assessment: Within Functional Limits Postural Control Postural Control: Within Functional Limits  Balance Balance Balance Assessed: Yes Static Sitting Balance Static Sitting - Balance Support: Feet supported Static Sitting - Level of Assistance: 7: Independent Dynamic Sitting Balance Dynamic Sitting - Balance Support: Feet supported Dynamic Sitting - Level of Assistance: 7: Independent Static Standing Balance Static Standing - Balance Support: During functional activity;Bilateral upper extremity supported Static Standing - Level of Assistance: 5: Stand by assistance Dynamic Standing Balance Dynamic Standing - Balance Support: During functional activity;Bilateral upper extremity  supported Dynamic Standing - Level of Assistance: 4: Min assist Extremity Assessment  RUE Assessment RUE Assessment: Within Functional Limits LUE Assessment LUE Assessment: Within Functional Limits RLE Assessment RLE Assessment: Exceptions to [facility] General Strength Comments: Grossly 4/5, endurance impaired LLE Assessment LLE Assessment: Exceptions to [place] [facility] Strength Comments: Grossly 4/5, impaired endurance  Care Tool Care Tool Bed Mobility Roll left and right activity   Roll left and right assist level: Supervision/Verbal cueing    Sit to lying activity   Sit to lying assist level: Supervision/Verbal cueing    Lying to sitting on side of bed activity   Lying to sitting on side of bed assist level: the ability to move from lying on the back to sitting on the side of the bed with no back support.: Supervision/Verbal cueing     Care Tool Transfers Sit to stand transfer   Sit to stand assist level: Contact Guard/Touching assist    Chair/bed transfer   Chair/bed transfer assist level: Contact Guard/Touching assist    Car transfer   Car transfer assist level: Minimal Assistance - Patient > 75%      Care Tool Locomotion Ambulation   Assist level: Minimal Assistance - Patient > 75% Assistive device: No Device Max distance: 65'  Walk 10 feet activity   Assist level: Minimal Assistance - Patient > 75% Assistive device: No Device   Walk 50 feet with 2 turns activity   Assist level: Minimal Assistance - Patient > 75% Assistive device: No Device  Walk 150 feet activity Walk 150 feet activity did not occur: Safety/medical concerns      Walk 10 feet on uneven surfaces activity   Assist level: Minimal Assistance - Patient > 75%    Stairs   Assist level: Contact Guard/Touching assist Stairs assistive device: 2 hand rails Max number of stairs: 4  Walk up/down 1 step activity   Walk up/down 1 step (curb) assist level: Contact Guard/Touching assist  Walk up/down 1 step or  curb assistive device: 2 hand rails  Walk up/down 4 steps activity   Walk up/down 4 steps assist level: Contact Guard/Touching assist Walk up/down 4 steps assistive device: 2 hand rails  Walk up/down 12 steps activity Walk up/down 12 steps activity did not occur: Safety/medical concerns      Pick up small objects from floor   Pick up small object from the floor assist level: Moderate Assistance - Patient 50 - 74%    Wheelchair Is the patient using a wheelchair?: Yes Type of Wheelchair: Manual   Wheelchair assist level: Dependent - Patient 0% Max wheelchair distance: 150'  Wheel 50 feet with 2 turns activity   Assist Level: Dependent - Patient 0%  Wheel 150 feet activity   Assist Level: Dependent - Patient 0%    Refer to Care Plan for Long Term Goals  SHORT TERM GOAL WEEK 1 PT Short Term Goal 1 (Week 1): STGs = LTGs  Recommendations for other services: None   Skilled Therapeutic Intervention  Evaluation completed (see details above and below) with education on PT POC and goals and individual treatment initiated with focus on transfers, balance, ambulation, and endurance. Pt performs stand step transfer to [facility] with minA and cues for hand placement, body mechanics, and sequencing. WC transport to gym. Pt completes ramp navigation and car transfer with minA and cues for sequencing and safety. Pt reports some shortness of breath and requires extended seated rest break. PT educates on importance of knowing limits and safety awareness. Pt stands and ambulates x65' without AD, with light minA for stability, with cues to increase step height and stride length to decrease risk for falls, and maintain upright gaze to improve posture and balance. Pt reports modified RPE at 7/10 following bout of ambulation. Extended seated rest break. Stand step back to [facility] with CGA and cues for eccentric control of stand to sit for safety and strengthening. Pt completes x4 6 steps with bilateral handrails and CGA,  with cues for safety and monitoring level of exertion. WC transport back to room. Stand step to bed with CGA. Left supine with all needs within reach.   Mobility Bed Mobility Bed Mobility: Sit to Supine;Supine to Sit Supine to Sit: Supervision/Verbal cueing Sit to Supine: Supervision/Verbal cueing Transfers Transfers: Sit to Stand;Stand to Sit;Stand Pivot Transfers Sit to Stand: Contact Guard/Touching assist Stand to Sit: Contact Guard/Touching assist Stand Pivot Transfers: Contact Guard/Touching assist Transfer (Assistive device): Rolling walker Locomotion  Gait Ambulation: Yes Gait Assistance: Minimal Assistance - Patient > 75% Gait Distance (Feet): 65 Feet Assistive device: None Gait Assistance Details: Verbal cues for gait pattern;Tactile cues for posture;Verbal cues for technique;Verbal cues for precautions/safety Gait Gait: Yes Gait Pattern: Impaired Gait Pattern: Decreased trunk rotation;Step-through pattern Gait velocity: reduced Stairs / Additional Locomotion Stairs: Yes Stairs Assistance: Contact Guard/Touching assist Stair Management Technique: Two rails Number of Stairs: 4 Height of Stairs: 6 Ramp: Minimal Assistance - Patient >75% Curb: Engineer, Manufacturing Wheelchair Mobility: No   Discharge Criteria: Patient James be discharged from PT if patient refuses treatment 3 consecutive times without medical reason, if treatment goals not met, if there is a change in medical status, if patient makes no progress towards goals or if patient is discharged from hospital.  The above assessment, treatment plan, treatment alternatives and goals were discussed and mutually agreed upon: by patient  [name], PT, DPT 11/03/2024, 12:57 PM

## 2024-11-03 NOTE — Patient Care Conference (Signed)
 Inpatient RehabilitationTeam Conference and Plan of Care Update Date: 11/03/2024   Time: 1008 am    Patient Name: James [name]      Medical Record Number: [id number]  Date of Birth: 02/19/1961 Sex: Male         Room/Bed: 4W18C/4W18C-01 Payor Info: Payor: BLUE CROSS BLUE SHIELD / Plan: BCBS COMM PPO / Product Type: *No Product type* /    Admit Date/Time:  11/02/2024  3:38 PM  Primary Diagnosis:  Debility  Hospital Problems: Principal Problem:   Debility    Expected Discharge Date: Expected Discharge Date: 11/07/24  Team Members Present: Physician leading conference: Dr. Joesph Likes Social Worker Present: Graeme Jude, LCSW Nurse Present: Eulalio Falls, RN PT Present: Kirt Dawn, PT OT Present: Nena Moats, OT SLP Present: Recardo Mole, SLP PPS Coordinator present : Eleanor Colon, SLP     Current Status/Progress Goal Weekly Team Focus  Bowel/Bladder      Continent of bowel and bladder   Remain continent of bowel and bladder     Assess bowel and bladder q shift  Swallow/Nutrition/ Hydration               ADL's   (S) UB ADLs, min A LB, CGA transfers, deconditioned   (S) to mod I   Deconditioned, ADLs, transfers, balance    Mobility   eval pending           Communication                Safety/Cognition/ Behavioral Observations               Pain    Pain buttocks area     <4 w/ prns    <4 w/ prns  Skin    1.wound Surgical Abdomen Medial;Upper  2. Wound Surgical Closed Surgical Incision Chest Right  3. Wound surgical Incision Sternum Medial  4. Pressure Injury Sacrum Medial Deep Tissue Pressure Injury - per WOC improving/wound care  Skin free of infection entire stay on rehab     Assess skin q shift    Discharge Planning:  TBA    Team Discussion: Patient admitted post debility due to LVAD implant. Patient progress limited by weakness, poor sternal precaution adherence.  Patient on target to meet rehab goals Currently patient  needs supervision assistance with upper body care and minimal assistance with lower body care. Patient transfers with CGA.  Mobility evals pending.  *See Care Plan and progress notes for long and short-term goals.   Revisions to Treatment Plan:  Dietary consult WOC following patient Wound care plan initiated Pressure redistribution chair pad Air mattress   Teaching Needs: Safety, medications, toileting ,transfers, wound care education, etc   Current Barriers to Discharge: Decreased caregiver support, Home enviroment access/layout, and LVAD  Possible Resolutions to Barriers: Family Education Cardiac Rehab follow up DME: BSC, RW     Medical Summary Current Status: CHF s/p LVAD placement, post surgical wound care, DM2, AKI, anemia, constipation, and meningioma  Barriers to Discharge: Cardiac Complications;Electrolyte abnormality;Medical stability;Self-care education   Possible Resolutions to Barriers/Weekly Focus: monitorring LVAD/volume status, titrate DM regimen, titrate bowel regimen   Continued Need for Acute Rehabilitation Level of Care: The patient requires daily medical management by a physician with specialized training in physical medicine and rehabilitation for the following reasons: Direction of a multidisciplinary physical rehabilitation program to maximize functional independence : Yes Medical management of patient stability for increased activity during participation in an intensive rehabilitation regime.: Yes Analysis of laboratory values  and/or radiology reports with any subsequent need for medication adjustment and/or medical intervention. : Yes   I attest that I was present, lead the team conference, and concur with the assessment and plan of the team.   Faatima Tench Gayo 11/03/2024, 1008 am

## 2024-11-03 NOTE — Progress Notes (Signed)
 Inpatient Rehabilitation  Patient information reviewed and entered into eRehab system by [facility]. Karen Kays., CCC/SLP, PPS Coordinator.  Information including medical coding, functional ability and quality indicators will be reviewed and updated through discharge.

## 2024-11-03 NOTE — Progress Notes (Signed)
 Patient having bouts of confusion this evening and saying things that don't make sense. Patient asked what medications he was taking, while nurse went through list once nurse stated Amiodarone  he stated Amiodarone , like the concrete mix? Nurse asked patient to explain what he meant, patient stated You know, the concrete they poor down and smooth out into the side walk, like the concrete that (mumbles) on my back. Nurse checked patients orientation status. Patient able to answer questions appropriately. All other needs met at this time, bed extended to provide comfort for patient's height, call light within reach, bed alarm active.

## 2024-11-04 LAB — CBC
HCT: 34 % — ABNORMAL LOW (ref 39.0–52.0)
Hemoglobin: 11 g/dL — ABNORMAL LOW (ref 13.0–17.0)
MCH: 31.1 pg (ref 26.0–34.0)
MCHC: 32.4 g/dL (ref 30.0–36.0)
MCV: 96 fL (ref 80.0–100.0)
Platelets: 204 K/uL (ref 150–400)
RBC: 3.54 MIL/uL — ABNORMAL LOW (ref 4.22–5.81)
RDW: 17.8 % — ABNORMAL HIGH (ref 11.5–15.5)
WBC: 9.1 K/uL (ref 4.0–10.5)
nRBC: 0 % (ref 0.0–0.2)

## 2024-11-04 LAB — BASIC METABOLIC PANEL WITH GFR
Anion gap: 12 (ref 5–15)
BUN: 21 mg/dL (ref 8–23)
CO2: 22 mmol/L (ref 22–32)
Calcium: 8.8 mg/dL — ABNORMAL LOW (ref 8.9–10.3)
Chloride: 99 mmol/L (ref 98–111)
Creatinine, Ser: 1.31 mg/dL — ABNORMAL HIGH (ref 0.61–1.24)
GFR, Estimated: >60 mL/min
Glucose, Bld: 158 mg/dL — ABNORMAL HIGH (ref 70–99)
Potassium: 5 mmol/L (ref 3.5–5.1)
Sodium: 133 mmol/L — ABNORMAL LOW (ref 135–145)

## 2024-11-04 LAB — PROTIME-INR
INR: 2.2 — ABNORMAL HIGH (ref 0.8–1.2)
Prothrombin Time: 25.5 s — ABNORMAL HIGH (ref 11.4–15.2)

## 2024-11-04 LAB — GLUCOSE, CAPILLARY
Glucose-Capillary: 91 mg/dL (ref 70–99)
Glucose-Capillary: 131 mg/dL — ABNORMAL HIGH (ref 70–99)
Glucose-Capillary: 114 mg/dL — ABNORMAL HIGH (ref 70–99)
Glucose-Capillary: 128 mg/dL — ABNORMAL HIGH (ref 70–99)

## 2024-11-04 MED ORDER — WARFARIN SODIUM 4 MG PO TABS
4.0000 mg | ORAL_TABLET | Freq: Once | ORAL | Status: AC
  Administered 2024-11-04: 4 mg via ORAL
  Filled 2024-11-04: qty 1

## 2024-11-04 NOTE — Care Management (Signed)
 Inpatient Rehabilitation Center Individual Statement of Services  Patient Name:  James [name]  Date:  11/04/2024  Welcome to the Inpatient Rehabilitation Center.  Our goal is to provide you with an individualized program based on your diagnosis and situation, designed to meet your specific needs.  With this comprehensive rehabilitation program, you will be expected to participate in at least 3 hours of rehabilitation therapies Monday-Friday, with modified therapy programming on the weekends.  Your rehabilitation program will include the following services:  Physical Therapy (PT), Occupational Therapy (OT), 24 hour per day rehabilitation nursing, Therapeutic Recreaction (TR), Psychology, Neuropsychology, Care Coordinator, Rehabilitation Medicine, Nutrition Services, Pharmacy Services, and Other  Weekly team conferences will be held on Tuesday to discuss your progress.  Your Inpatient Rehabilitation Care Coordinator will talk with you frequently to get your input and to update you on team discussions.  Team conferences with you and your family in attendance may also be held.  Expected length of stay: 5-7 days    Overall anticipated outcome: Supervision  Depending on your progress and recovery, your program may change. Your Inpatient Rehabilitation Care Coordinator will coordinate services and will keep you informed of any changes. Your Inpatient Rehabilitation Care Coordinator's name and contact numbers are listed  below.  The following services may also be recommended but are not provided by the Inpatient Rehabilitation Center:  Driving Evaluations Home Health Rehabiltiation Services Outpatient Rehabilitation Services Vocational Rehabilitation   Arrangements will be made to provide these services after discharge if needed.  Arrangements include referral to agencies that provide these services.  Your insurance has been verified to be:  BCBS  Your primary doctor is:  Leita Longs  Pertinent information will be shared with your doctor and your insurance company.  Inpatient Rehabilitation Care Coordinator:  Graeme Jude, KEN [phone] or [phone]  Information discussed with and copy given to patient by: Graeme DELENA Jude, 11/04/2024, 12:17 PM

## 2024-11-04 NOTE — Progress Notes (Signed)
 Occupational Therapy Session Note  Patient Details  Name: James [name] MRN: [id number] Date of Birth: Mar 12, 1961  Today's Date: 11/04/2024 OT Individual Time: 1400-1454 OT Individual Time Calculation (min): 54 min    Short Term Goals: Week 1:  OT Short Term Goal 1 (Week 1): STG= LTG d/t ELOS  Skilled Therapeutic Interventions/Progress Updates:    Pt received sitting in the wc with c/o generalized fatigue and some soreness in his bottom from sitting. Reminded him of pressure relief strategies while sitting and he stated I do that. He declined walking to the gym. He completed functional mobility, 5 ft to the w/c with wobbly legs but no LOB, close (S). He completed oral care seated at the sink with set up assist. He was wheeled into the bathroom where he stood with close (S) and voided urine in standing. He transferred back to seated with very slow pace but (S). Pt was taken via w/c to the therapy gym for time management. He completed 6x1 minute Kinetron intervals to strengthen LB and provide HIIT style training for cardiovascular endurance/training. Pt required use of rest breaks throughout session for recovery, as well as to support safety and prevent overexertion. During breaks, OT monitored recovery time to assess endurance and response to exertion. He returned to his room following. He was left sitting up with all needs met. Final 6 min missed 2/2 fatigue.   Therapy Documentation Precautions:  Precautions Precautions: Fall Precaution/Restrictions Comments: LVAD Restrictions Weight Bearing Restrictions Per Provider Order: No RUE Weight Bearing Per Provider Order: Non weight bearing LUE Weight Bearing Per Provider Order: Non weight bearing Other Position/Activity Restrictions: sternal precautions  Therapy/Group: Individual Therapy  [name] 11/04/2024, 8:00 AM

## 2024-11-04 NOTE — Progress Notes (Addendum)
 "                                                        PROGRESS NOTE   Subjective/Complaints:  No events overnight.  Vitals are stable.  Lab parameters look good.  Labs relatively stable, mild uptrend in creatinine but BUN stable.  Patient complaining of intermittent discomfort in his left ear, he states was occurring prior to hospitalization.  Has no history of this prior.  Otherwise, feeling pretty good, deconditioned after LVAD placement but working hard with therapies.  ROS: Denies fevers, chills, N/V, abdominal pain, constipation, diarrhea, SOB, cough, chest pain, new weakness or paraesthesias.   + Left ear pain Objective:   No results found. Recent Labs    11/02/24 0553 11/03/24 0459  WBC 8.6 9.1  HGB 10.7* 11.2*  HCT 33.0* 33.9*  PLT 202 211   Recent Labs    11/02/24 0554 11/03/24 0459  NA 131* 137  K 4.2 4.6  CL 98 101  CO2 26 26  GLUCOSE 110* 101*  BUN 22 22  CREATININE 1.08 1.17  CALCIUM  8.5* 8.7*    Intake/Output Summary (Last 24 hours) at 11/04/2024 0157 Last data filed at 11/04/2024 0054 Gross per 24 hour  Intake 356 ml  Output 1202 ml  Net -846 ml     Wound 11/02/24 1719 Pressure Injury Sacrum Medial Deep Tissue Pressure Injury - Purple or maroon localized area of discolored intact skin or blood-filled blister due to damage of underlying soft tissue from pressure and/or shear. (Active)    Physical Exam: Vital Signs Blood pressure 98/82, pulse 72, temperature 98.4 F (36.9 C), temperature source Oral, resp. rate 18, height 6' 6 (1.981 m), weight 91 kg, SpO2 90%. Constitutional: No apparent distress. Appropriate appearance for age.  Sitting upright in bed. HENT: No JVD. Neck Supple. Trachea midline. Atraumatic, normocephalic. + Slight erythema on left tympanic membrane at 10 to 11 o'clock position; no bulging or visible fluid behind the membrane.  Eyes: PERRLA. EOMI. Visual fields grossly intact.  Cardiovascular: Audible electronic thrum  No  Edema.   LVAD parameters: - Speed 5400 -Flow 4.4 -PI 6.0 -Power 3.8  Respiratory: CTAB. No rales, rhonchi, or wheezing. On RA.  Abdomen: + bowel sounds, normoactive. No distention or tenderness.  Skin: Driveline site clean/dry/intact.  Remaining sutures on abdomen Sacral wound; unable to examine   MSK:      No apparent deformity.      Strength:                RUE: 5/5 SA, 5/5 EF, 5/5 EE, 5/5 WE, 5/5 FF, 5/5 FA                 LUE: 5/5 SA, 5/5 EF, 5/5 EE, 5/5 WE, 5/5 FF, 5/5 FA                 RLE: 5/5 HF, 5/5 KE, 5/5 DF, 5/5 EHL, 5/5 PF                 LLE:  5/5 HF, 5/5 KE, 5/5 DF, 5/5 EHL, 5/5 PF   Neurologic exam:  Cognition: AAO to person, place, time and event.  Language: Fluent, No substitutions or neoglisms. No dysarthria.  Memory: Recalls 3/3 objects at 5 minutes. No apparent deficits  Insight: Good  insight into current condition.  Mood: Pleasant affect, appropriate mood.  Sensation: Equal and intact in BL UE and Les.  Reflexes: 2+ in BL UE and LEs. Negative Hoffman's and babinski signs bilaterally.  CN: 2-12 grossly intact.  Coordination: No apparent tremors. No ataxia on FTN, HTS bilaterally. Spasticity: MAS 0 in all extremities.         Assessment/Plan: 1. Functional deficits which require 3+ hours per day of interdisciplinary therapy in a comprehensive inpatient rehab setting. Physiatrist is providing close team supervision and 24 hour management of active medical problems listed below. Physiatrist and rehab team continue to assess barriers to discharge/monitor patient progress toward functional and medical goals  Care Tool:  Bathing    Body parts bathed by patient: Right arm, Left arm, Chest, Abdomen, Buttocks, Right upper leg, Left upper leg, Right lower leg, Front perineal area, Face, Left lower leg         Bathing assist Assist Level: Contact Guard/Touching assist     Upper Body Dressing/Undressing Upper body dressing   What is the patient  wearing?: Pull over shirt    Upper body assist Assist Level: Supervision/Verbal cueing    Lower Body Dressing/Undressing Lower body dressing      What is the patient wearing?: Pants     Lower body assist Assist for lower body dressing: Minimal Assistance - Patient > 75%     Toileting Toileting    Toileting assist Assist for toileting: Minimal Assistance - Patient > 75%     Transfers Chair/bed transfer  Transfers assist     Chair/bed transfer assist level: Contact Guard/Touching assist     Locomotion Ambulation   Ambulation assist      Assist level: Minimal Assistance - Patient > 75% Assistive device: No Device Max distance: 65'   Walk 10 feet activity   Assist     Assist level: Minimal Assistance - Patient > 75% Assistive device: No Device   Walk 50 feet activity   Assist    Assist level: Minimal Assistance - Patient > 75% Assistive device: No Device    Walk 150 feet activity   Assist Walk 150 feet activity did not occur: Safety/medical concerns         Walk 10 feet on uneven surface  activity   Assist     Assist level: Minimal Assistance - Patient > 75%     Wheelchair     Assist Is the patient using a wheelchair?: Yes Type of Wheelchair: Manual    Wheelchair assist level: Dependent - Patient 0% Max wheelchair distance: 150'    Wheelchair 50 feet with 2 turns activity    Assist        Assist Level: Dependent - Patient 0%   Wheelchair 150 feet activity     Assist      Assist Level: Dependent - Patient 0%   Blood pressure 98/82, pulse 72, temperature 98.4 F (36.9 C), temperature source Oral, resp. rate 18, height 6' 6 (1.981 m), weight 91 kg, SpO2 90%.  Medical Problem List and Plan: 1. Functional deficits secondary to debility related CHF/ LVAD placement             -patient may  shower             -ELOS/Goals: 5-7 days, mod I  - Stable to continue inpatient rehab  2.   Antithrombotics: -DVT/anticoagulation:  Mechanical:  Antiembolism stockings, knee (TED hose) Bilateral lower extremities              -  antiplatelet therapy: Was on apixaban  PTA for hx Afib > now with LVAD on warfarin  INR Goal: 2.0 - 2.5  coumadin  dosing per pharmacy.   3. Pain Management: Gabapentin  200 mg at bedtime. PRN: tylenol  and oxycodone    - 1-20: Pain well-controlled on current regimen.  4. Mood/Behavior/Sleep: LCSW to follow for evaluation and support when available.              -antipsychotic agents: n/a  5. Neuropsych/cognition: This patient is capable of making decisions on his own behalf. 6. Skin/Wound Care: Routine pressure relief measures. Continue sternal precautions.  Drive Line: Continue MWF dressing changes by VAD probation officer. Next dressing change due 11/04/24 by VAD coordinator or nurse champion. -Sacral wound: Continue Santyl  oint apply to yellow portion of sacral wound daily. Pressure relief, encourage appropriate nutrition - 1-20: Has had abdominal sutures since 12-31.  Will DC in AM.   7. Fluids/Electrolytes/Nutrition: Monitor strict I&O and daily weight.              -GERD: Protonix               -Ensure and vitamin supplements    8. Acute on chronic systolic CHF, NYHA IV: S/p HM III LVAD implant by Dr. Lucas 10/14/24. Advanced HF team following.              - Continue Lasix  40 mg daily with potassium 40 mEq, Sildenafil  20 mg TID for RV support. Digoxin  0.0625 mg daily. Dig level 0.9 1/13. Spironolactone  50 mg bid. Warfarin per pharmacy dosing.              -daily weights  - 1-20: LVAD parameters stable, no d/d volume overload, appreciate cardiology recommendations Filed Weights   11/03/24 0600  Weight: 91 kg    9. Pulmonary hypertension: Severe combined pre/post capillary PH on RHC-resolved    10. AKI on CKD stage 3: Scr stable, follow BMP in AM.    - 1-20: Mild increase in creatinine, encourage p.o. fluids.  Continue to monitor.  11.  PVCs/NSVT: Improved. Has Biotronik ICD.              -Continue mexiletine 150 mg bid.    12. Persistent Atrial fibrillation: s/p DCCV 1/12, currently NSR.              -Continue amiodarone  200 mg bid and warfarin.    13. ABLA: Hgb 10.7, Monitor daily INR, CBC, and for s/sx of bleeding.    - 1-20: Hemoglobin stable 11 14. Hypokalemia/Hypomagnesemia: On potassium 40 mEq, continue supplementation PRN.   - 1-20: Magnesium  2.0, potassium 4.6 on admission labs.  Monitor.  15. T2DM: A1c 6.0-- Holding home Metformin and Farxiga . Insulin  reqs significantly decreased since PO intake.  -Continue monitoring glucose ac/hs with SSI as needed.    - Resume oral medications as appropriate Recent Labs    11/03/24 1203 11/03/24 1636 11/03/24 2107  GLUCAP 140* 106* 112*     16. Constipation: Small recorded on 1/19, pt still endrosing constipation.              -Miralax  now BID, Senokot-S at bedtime.  -give 60 cc sorbitol  tonight.  -PRN senokot and sorbitol .    17. Prostate cancer: s/p radiation.    18. Meningioma: Monitoring.    19.  Left ear pain.  Appears consistent with otitis externa.    - 1/20: Start Oxifloxacin 5 drops to the left ear daily for 7 days.    LOS: 2 days  A FACE TO FACE EVALUATION WAS PERFORMED  Joesph JAYSON Likes 11/04/2024, 1:57 AM     "

## 2024-11-04 NOTE — Progress Notes (Addendum)
 "                                                        PROGRESS NOTE   Subjective/Complaints:  No events overnight.  Vitals are stable.  Could not sleep in LAL mattress, insisting to go back to regular mattress.  A.m. labs with stable CBC Continent of bowel bladder Last bowel movement 1-21, medium   ROS: Denies fevers, chills, N/V, abdominal pain, constipation, diarrhea, SOB, cough, chest pain, new weakness or paraesthesias.   + Left ear pain--ongoing   Objective:   No results found. Recent Labs    11/03/24 0459 11/04/24 0510  WBC 9.1 9.1  HGB 11.2* 11.0*  HCT 33.9* 34.0*  PLT 211 204   Recent Labs    11/02/24 0554 11/03/24 0459  NA 131* 137  K 4.2 4.6  CL 98 101  CO2 26 26  GLUCOSE 110* 101*  BUN 22 22  CREATININE 1.08 1.17  CALCIUM  8.5* 8.7*    Intake/Output Summary (Last 24 hours) at 11/04/2024 9092 Last data filed at 11/04/2024 9288 Gross per 24 hour  Intake 1064 ml  Output 1227 ml  Net -163 ml     Wound 11/02/24 1719 Pressure Injury Sacrum Medial Deep Tissue Pressure Injury - Purple or maroon localized area of discolored intact skin or blood-filled blister due to damage of underlying soft tissue from pressure and/or shear. (Active)    Physical Exam: Vital Signs Blood pressure 94/72, pulse 66, temperature 98.6 F (37 C), temperature source Oral, resp. rate 18, height 6' 6 (1.981 m), weight 91 kg, SpO2 99%. Constitutional: No apparent distress. Appropriate appearance for age.  Sitting in chair.   HENT: No JVD. Neck Supple. Trachea midline. Atraumatic, normocephalic. + Slight erythema on left tympanic membrane at 10 to 11 o'clock position; no bulging or visible fluid behind the membrane.  Eyes: PERRLA. EOMI. Visual fields grossly intact.  Cardiovascular: Audible electronic thrum  No Edema.   LVAD parameters: - Speed 5400 -Flow 4.1 -PI 6.0 -Power 3.8  Respiratory: CTAB. No rales, rhonchi, or wheezing. On RA.  Abdomen: + bowel sounds,  normoactive. No distention or tenderness.  Skin: Driveline site clean/dry/intact.  Remaining sutures on abdomen Sacral wound; unable to examine   MSK:      No apparent deformity.      Strength:                RUE: 5/5 SA, 5/5 EF, 5/5 EE, 5/5 WE, 5/5 FF, 5/5 FA                 LUE: 5/5 SA, 5/5 EF, 5/5 EE, 5/5 WE, 5/5 FF, 5/5 FA                 RLE: 5/5 HF, 5/5 KE, 5/5 DF, 5/5 EHL, 5/5 PF                 LLE:  5/5 HF, 5/5 KE, 5/5 DF, 5/5 EHL, 5/5 PF   Neurologic exam:  Cognition: AAO to person, place, time and event.  Language: Fluent, No substitutions or neoglisms. No dysarthria.  Memory: Recalls 3/3 objects at 5 minutes. No apparent deficits  Insight: Good  insight into current condition.  Mood: Pleasant affect, appropriate mood.  Sensation: Equal and intact in BL UE and Les.  Reflexes: 2+ in BL UE and LEs. Negative Hoffman's and babinski signs bilaterally.  CN: 2-12 grossly intact.  Coordination: No apparent tremors. No ataxia on FTN, HTS bilaterally. Spasticity: MAS 0 in all extremities.     Physical exam unchanged from the above on reexamination 11/04/24    Assessment/Plan: 1. Functional deficits which require 3+ hours per day of interdisciplinary therapy in a comprehensive inpatient rehab setting. Physiatrist is providing close team supervision and 24 hour management of active medical problems listed below. Physiatrist and rehab team continue to assess barriers to discharge/monitor patient progress toward functional and medical goals  Care Tool:  Bathing    Body parts bathed by patient: Right arm, Left arm, Chest, Abdomen, Buttocks, Right upper leg, Left upper leg, Right lower leg, Front perineal area, Face, Left lower leg         Bathing assist Assist Level: Contact Guard/Touching assist     Upper Body Dressing/Undressing Upper body dressing   What is the patient wearing?: Pull over shirt    Upper body assist Assist Level: Supervision/Verbal cueing    Lower  Body Dressing/Undressing Lower body dressing      What is the patient wearing?: Pants     Lower body assist Assist for lower body dressing: Minimal Assistance - Patient > 75%     Toileting Toileting    Toileting assist Assist for toileting: Minimal Assistance - Patient > 75%     Transfers Chair/bed transfer  Transfers assist     Chair/bed transfer assist level: Contact Guard/Touching assist     Locomotion Ambulation   Ambulation assist      Assist level: Minimal Assistance - Patient > 75% Assistive device: No Device Max distance: 65'   Walk 10 feet activity   Assist     Assist level: Minimal Assistance - Patient > 75% Assistive device: No Device   Walk 50 feet activity   Assist    Assist level: Minimal Assistance - Patient > 75% Assistive device: No Device    Walk 150 feet activity   Assist Walk 150 feet activity did not occur: Safety/medical concerns         Walk 10 feet on uneven surface  activity   Assist     Assist level: Minimal Assistance - Patient > 75%     Wheelchair     Assist Is the patient using a wheelchair?: Yes Type of Wheelchair: Manual    Wheelchair assist level: Dependent - Patient 0% Max wheelchair distance: 150'    Wheelchair 50 feet with 2 turns activity    Assist        Assist Level: Dependent - Patient 0%   Wheelchair 150 feet activity     Assist      Assist Level: Dependent - Patient 0%   Blood pressure 94/72, pulse 66, temperature 98.6 F (37 C), temperature source Oral, resp. rate 18, height 6' 6 (1.981 m), weight 91 kg, SpO2 99%.  Medical Problem List and Plan: 1. Functional deficits secondary to debility related CHF/ LVAD placement             -patient may  shower             -ELOS/Goals: 5-7 days, mod I  - Stable to continue inpatient rehab  2.  Antithrombotics: -DVT/anticoagulation:  Mechanical:  Antiembolism stockings, knee (TED hose) Bilateral lower extremities               -antiplatelet therapy: Was on apixaban  PTA for hx Afib >  now with LVAD on warfarin  INR Goal: 2.0 - 2.5  coumadin  dosing per pharmacy.   3. Pain Management: Gabapentin  200 mg at bedtime. PRN: tylenol  and oxycodone    - 1-20: Pain well-controlled on current regimen.  4. Mood/Behavior/Sleep: LCSW to follow for evaluation and support when available.              -antipsychotic agents: n/a  5. Neuropsych/cognition: This patient is capable of making decisions on his own behalf. 6. Skin/Wound Care: Routine pressure relief measures. Continue sternal precautions.  Drive Line: Continue MWF dressing changes by VAD probation officer. Next dressing change due 11/04/24 by VAD coordinator or nurse champion. -Sacral wound: Continue Santyl  oint apply to yellow portion of sacral wound daily. Pressure relief, encourage appropriate nutrition - 1-20: Has had abdominal sutures since 12-31.  Will DC --ordered 1/21  - 1/21: Switched from LAL to gel mattress with frequent rolling for patient comfort   7. Fluids/Electrolytes/Nutrition: Monitor strict I&O and daily weight.              -GERD: Protonix               -Ensure and vitamin supplements    8. Acute on chronic systolic CHF, NYHA IV: S/p HM III LVAD implant by Dr. Lucas 10/14/24. Advanced HF team following.              - Continue Lasix  40 mg daily with potassium 40 mEq, Sildenafil  20 mg TID for RV support. Digoxin  0.0625 mg daily. Dig level 0.9 1/13. Spironolactone  50 mg bid. Warfarin per pharmacy dosing.              -daily weights  - 11-04-19: LVAD parameters stable, no d/d volume overload, appreciate cardiology recommendations Filed Weights   11/03/24 0600  Weight: 91 kg    9. Pulmonary hypertension: Severe combined pre/post capillary PH on RHC-resolved    10. AKI on CKD stage 3: Scr stable, follow BMP in AM.    - 1-20: Mild increase in creatinine, encourage p.o. fluids.  Continue to monitor.  11. PVCs/NSVT: Improved. Has  Biotronik ICD.              -Continue mexiletine 150 mg bid.    12. Persistent Atrial fibrillation: s/p DCCV 1/12, currently NSR.              -Continue amiodarone  200 mg bid and warfarin.    13. ABLA: Hgb 10.7, Monitor daily INR, CBC, and for s/sx of bleeding.    - 1-20: Hemoglobin stable 11 14. Hypokalemia/Hypomagnesemia: On potassium 40 mEq, continue supplementation PRN.   - 1-20: Magnesium  2.0, potassium 4.6 on admission labs.  Monitor.  15. T2DM: A1c 6.0-- Holding home Metformin and Farxiga . Insulin  reqs significantly decreased since PO intake.  -Continue monitoring glucose ac/hs with SSI as needed.    - Resume oral medications as appropriate Recent Labs    11/03/24 1636 11/03/24 2107 11/04/24 0554  GLUCAP 106* 112* 91     16. Constipation: Small recorded on 1/19, pt still endrosing constipation.              -Miralax  now BID, Senokot-S at bedtime.  -give 60 cc sorbitol  tonight.  -PRN senokot and sorbitol .  - LBM 1/21, medium   17. Prostate cancer: s/p radiation.    18. Meningioma: Monitoring.    19.  Left ear pain.  Appears consistent with otitis externa.    - 1/20: Start Oxifloxacin 5 drops to the left ear daily  for 7 days     LOS: 2 days A FACE TO FACE EVALUATION WAS PERFORMED  Joesph JAYSON Likes 11/04/2024, 9:07 AM     "

## 2024-11-04 NOTE — Progress Notes (Signed)
 PHARMACY - ANTICOAGULATION CONSULT NOTE  Pharmacy Consult for warfarin Indication: LVAD HM3 + hx AFib  Allergies[1]  Patient Measurements: Height: 6' 6 (198.1 cm) Weight: 91 kg (200 lb 10.3 oz) IBW/kg (Calculated) : 91.4  Vital Signs: Temp: 98.6 F (37 C) (01/21 0523) Temp Source: Oral (01/21 0523) BP: 90/68 (01/21 0500) Pulse Rate: 66 (01/21 0523)  Labs: Recent Labs    11/02/24 0553 11/02/24 0554 11/03/24 0459 11/04/24 0510  HGB 10.7*  --  11.2* 11.0*  HCT 33.0*  --  33.9* 34.0*  PLT 202  --  211 204  LABPROT 20.6*  --  23.4* 25.5*  INR 1.7*  --  2.0* 2.2*  CREATININE  --  1.08 1.17  --     Estimated Creatinine Clearance: 83.2 mL/min (by C-G formula based on SCr of 1.17 mg/dL).   Medical History: Past Medical History:  Diagnosis Date   CKD (chronic kidney disease) stage 2, GFR 60-89 ml/min    COPD (chronic obstructive pulmonary disease) (HCC)    Diabetes mellitus type II, non insulin  dependent (HCC)    Dilated aortic root    Elevated PSA    Hypercholesteremia    Hypertension    NICM (nonischemic cardiomyopathy) (HCC)    NSVT (nonsustained ventricular tachycardia) (HCC)    Obstructive sleep apnea    Paroxysmal atrial fibrillation (HCC)    Pulmonary hypertension (HCC)    PVC's (premature ventricular contractions)    Tobacco abuse     Medications:  Scheduled:   amiodarone   200 mg Oral BID   atorvastatin   40 mg Oral Daily   Chlorhexidine  Gluconate Cloth  6 each Topical Daily   collagenase    Topical Daily   digoxin   0.0625 mg Oral Daily   furosemide   40 mg Oral Daily   gabapentin   200 mg Oral QHS   insulin  aspart  0-15 Units Subcutaneous TID WC   mexiletine  150 mg Oral Q12H   multivitamin with minerals  1 tablet Oral Daily   ofloxacin   5 drop Left EAR Daily   pantoprazole   40 mg Oral Daily   potassium chloride   40 mEq Oral Daily   senna-docusate  2 tablet Oral QHS   sildenafil   20 mg Oral TID   sodium chloride  flush  10-40 mL Intracatheter Q12H    spironolactone   50 mg Oral BID   Warfarin - Pharmacist Dosing Inpatient   Does not apply q1600   zinc  sulfate (50mg  elemental zinc )  220 mg Oral Q supper    Assessment: 63 yom who underwent LVAD HM3 placement on 12/31. Was on apixaban  PTA for hx Afib > now with LVAD plan to change to warfarin.   INR is therapeutic at 2.2. Hgb 11, plt 204. No s/sx of bleeding. Oral intake documented at 60% yesterday for 1 meal but has endorsed eating better.   Goal of Therapy:  INR 2-2.5 Monitor platelets by anticoagulation protocol: Yes   Plan:  Warfarin 4 mg daily  Monitor daily INR, CBC, and for s/sx of bleeding  F/u PO intake  Thank you for allowing pharmacy to participate in this patient's care,  Suzen Sour, PharmD, BCCCP Clinical Pharmacist  Phone: [phone] 11/04/2024 7:32 AM  Please check AMION for all [facility] Pharmacy phone numbers After 10:00 PM, call Main Pharmacy [phone]    [1] No Known Allergies

## 2024-11-04 NOTE — Progress Notes (Signed)
 Patient ID: Rashaun Wichert, male   DOB: [birth year], 64 y.o.   MRN: [id number]  SW met with pt and pt wife to discuss d/c date 1/24 and d/c process. Pt wife is concerned on wound on his sacrum. SW shared will have nursing discuss. She expressed concerns about him leaving too soon, especially with the wound. She is comfortable with LVAD. SW discussed family edu. SSW asked about her staying today but she is waiting for he daughter to deliver twins and will be in the birthing room. he will come in tomorrow 9am-11am.    Graeme Jude, MSW, LCSW Office: [phone] Cell: [phone] Fax: [phone]

## 2024-11-04 NOTE — Progress Notes (Signed)
 Met with patient to review current situation, team conference and plan of care.Reviewed medications, wound care, incision care. Reviewed nutrition, hydration, bowel, bladder, pain , sleep and follow up with MD. Continue to follow along to provide educational needs to facilitate preparation for discharge.

## 2024-11-04 NOTE — Progress Notes (Signed)
 Physical Therapy Session Note  Patient Details  Name: James [name] MRN: [id number] Date of Birth: 1961/04/17  Today's Date: 11/04/2024 PT Individual Time: 0805-0900 PT Individual Time Calculation (min): 55 min   Today's Date: 11/04/2024 PT Individual Time: 1104-1200 PT Individual Time Calculation (min): 56 min   Today's Date: 11/04/2024 PT Individual Time: 8499-8476 PT Individual Time Calculation (min): 23 min   Short Term Goals: Week 1:  PT Short Term Goal 1 (Week 1): STGs = LTGs  Skilled Therapeutic Interventions/Progress Updates:     1st Session: Pt received semi reclined in bed and agrees to therapy. Reports having horrible night due to air bed and feeling uncomfortable and not liking the noise that air bed makes. Pt reports some pain in flank. Number not provided. PT alerts RN to pain and provides pt with rest breaks and gentle mobility to manage pain. Pt performs supine to sit with cues for positioning at EOB. PT assists to change LVAD to battery power and don holster for batteries. Pt able to don shoes with setup assistance and increased time. Pt performs stand step to [facility] with CGA and cues for sequencing and positioning. WC transport to gym. Pt performs x20 LAQs on each leg to prepare knee extensors for mobility. Pt performs sit to stand with CGA multiple times during session, with cues for hand placement and body mechanics. Pt ambulates multiple bouts to work on balance and endurance. Pt ambulates 4x80' with extended seated rest breaks. WC transport back to room. Left seated with alarm intact and all needs within reach.   2nd Session: PT received seated in recliner and agrees to therapy. No complaint of pain. Pt performs stand step to [facility] with cues for initiation and positioning. WC transport to gym. Pt requests seated rest break prior to ambulating. Pt then stands and ambulates x100' with CGA and cues for upright posture, trunk rotation, and arm swing to improve balance. Following,  pt reports he feels less fatigued than previous bouts with this therapist.   Pt attempts alternating foot taps on 4 step and is able to complete x8 total, but then has LOB backward and requires modA for safety and to guide pt back to WC. Pt performs 2x15 LAQs with 5lb ankle weights and 1x15 seated marches. Following, pt performs 2x10 foot taps on step with one instance of minA required for slight LOB, but no symptoms of orthostasis. WC transport back to room. Left seated with alarm intact and all needs within reach.   3rd Session: Pt received seated in [facility] and agrees to therapy. No complaint of pain. WC transport to gym. Pt performs stand step to mat table with CGA and cues for positioning.   Pt completes TUG test with following times: 17.8 seconds, 18.0 seconds, and 18.8 seconds. Pt has one LOB when turning on final attempt and requires minA for stability. Pt perseverating on ear feeling stopped up. Stand step transfer from mat table to [facility] to recliner with cues for positioning and eccentric control of stand to sit for safety. Left seated in recliner with all needs within reach.   Therapy Documentation Precautions:  Precautions Precautions: Fall Precaution/Restrictions Comments: LVAD Restrictions Weight Bearing Restrictions Per Provider Order: No RUE Weight Bearing Per Provider Order: Non weight bearing LUE Weight Bearing Per Provider Order: Non weight bearing Other Position/Activity Restrictions: sternal precautions   Therapy/Group: Individual Therapy  James [name], PT, DPT 11/04/2024, 4:44 PM

## 2024-11-04 NOTE — Progress Notes (Addendum)
 Patient ID: James [name], male   DOB: 1960/12/23, 64 y.o.   MRN: [id number]     Advanced Heart Failure Rounding Note   AH[facility] Cardiologist: Dr. Zenaida Chief Complaint: Post-op LVAD Patient Profile   James [name] is a 64 y.o. male with history of stage D cardiomyopathy/chronic systolic heart failure now end-stage w/ inotrope dependence, CKD IIIa, hx VT, PAF, prostate cancer, meningioma.   Admitted for LVAD implant/ pre-VAD HF optimization.   Significant events:   12/18: Milrinone  titrated from 0.25 to 0.5 mcg/kg/min post RHC d/t low-output and pulmonary hypertension 12/19: NE added 12/22: Markedly elevated PA pressures, elevated PCWP and elevated SVR. Titrated off NE. Added DBA.  12/23: Impella 5.5 Implanted.  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 Heparin  > Bival.  12/31: HM III LVAD implant 1/5: LVAD speed increased to 5400  1/6: LUE weakness, head CT negative  1/9: Ramp echo, speed increased to 5500 rpm 1/11: Off milrinone . 1/12: bedside TEE/DCCV. EF 20%, mod/sev HK. VAD speed decreased to 5400 1/14: transferred out of CCU  1/15: repeat head CT negative for acute changes   Subjective:    S/P HM3 LVAD 10/14/24.  VSS. Working with PT. INR 2.2. No complaints on exam. MAP 80s  LVAD INTERROGATION:  HeartMate IIl LVAD:  Flow 4.9 liters/min, speed 5400  power 5 PI 3.2   Objective:    Weight Range: 91 kg Body mass index is 23.19 kg/m.   Vital Signs:   Temp:  [98.3 F (36.8 C)-98.6 F (37 C)] 98.6 F (37 C) (01/21 0523) Pulse Rate:  [66-72] 66 (01/21 0523) Resp:  [18] 18 (01/21 0523) BP: (90-102)/(66-86) 94/72 (01/21 0745) SpO2:  [90 %-100 %] 99 % (01/21 0523) Last BM Date : 11/02/24  Doppler MAP: 80s  Weight change: Filed Weights   11/03/24 0600  Weight: 91 kg   Intake/Output:  Intake/Output Summary (Last 24 hours) at 11/04/2024 1048 Last data filed at 11/04/2024 0808 Gross per 24 hour  Intake 828 ml  Output 1427 ml  Net -599 ml    Physical Exam   General: Well  appearing. No distress Cardiac: JVP flat. Mechanical heart sounds with LVAD hum present.  Driveline: Dressing C/D/I. No drainage or redness. Anchor in place. Extremities: Warm and dry. No edema. Neuro: Alert and oriented x3. Affect pleasant.   Labs   CBC Recent Labs    11/03/24 0459 11/04/24 0510  WBC 9.1 9.1  NEUTROABS 7.5  --   HGB 11.2* 11.0*  HCT 33.9* 34.0*  MCV 95.2 96.0  PLT 211 204   Basic Metabolic Panel Recent Labs    98/80/73 0554 11/03/24 0459 11/03/24 1038  NA 131* 137  --   K 4.2 4.6  --   CL 98 101  --   CO2 26 26  --   GLUCOSE 110* 101*  --   BUN 22 22  --   CREATININE 1.08 1.17  --   CALCIUM  8.5* 8.7*  --   MG  --   --  2.0  PHOS 2.7  --   --    Liver Function Tests Recent Labs    11/02/24 0554 11/03/24 0459  AST  --  32  ALT  --  47*  ALKPHOS  --  114  BILITOT  --  0.9  PROT  --  6.3*  ALBUMIN  3.1* 3.1*   BNP (last 3 results) Recent Labs    06/10/24 1213 07/09/24 1649 07/23/24 0959  BNP 1,071.5* 3,316.0* 595.8*   ProBNP (last  3 results) Recent Labs    10/15/24 0400 10/21/24 0433 10/28/24 0559  PROBNP 9,508.0* 5,277.0* 8,170.0*   Medications:    Scheduled Medications:  amiodarone   200 mg Oral BID   atorvastatin   40 mg Oral Daily   Chlorhexidine  Gluconate Cloth  6 each Topical Daily   collagenase    Topical Daily   digoxin   0.0625 mg Oral Daily   furosemide   40 mg Oral Daily   gabapentin   200 mg Oral QHS   insulin  aspart  0-15 Units Subcutaneous TID WC   mexiletine  150 mg Oral Q12H   multivitamin with minerals  1 tablet Oral Daily   ofloxacin   5 drop Left EAR Daily   pantoprazole   40 mg Oral Daily   potassium chloride   40 mEq Oral Daily   senna-docusate  2 tablet Oral QHS   sildenafil   20 mg Oral TID   sodium chloride  flush  10-40 mL Intracatheter Q12H   spironolactone   50 mg Oral BID   warfarin  4 mg Oral ONCE-1600   Warfarin - Pharmacist Dosing Inpatient   Does not apply q1600   zinc  sulfate (50mg  elemental zinc )   220 mg Oral Q supper    Infusions:    PRN Medications: acetaminophen , alum & mag hydroxide-simeth, bisacodyl , diphenhydrAMINE , guaiFENesin -dextromethorphan , mouth rinse, oxyCODONE , phenol, prochlorperazine  **OR** prochlorperazine  **OR** prochlorperazine , senna, sodium phosphate  Assessment/Plan   Acute on chronic systolic CHF, NYHA IV: Nonischemic cardiomyopathy. RHC 12/18 showed elevated filling pressures with mod-severe mixed pulmonary arterial/pulmonary venous hypertension and low CO despite milrinone  0.25, increased to 0.5. Impella 5.5 placed 12/23 for further optimization. - S/p HM III LVAD implant by Dr. Lucas 10/14/24. - iNO off 1/1; Milrinone  off 1/11. Co-ox stable.  - Ramp echo 1/9, increased speed to 5500 rpm. TEE 1/12 decreased speed to 5400. - Volume ok. Continue Lasix  40 mg daily.  - Continue sildenafil  20 mg TID for RV support - Continue digoxin  0.0625 mg daily. Dig level 0.9 1/13 - Continue spironolactone  50 mg bid - MAPs 80s - INR 2.2 Discussed warfarin dosing with PharmD personally.  PVCs/NSVT: Improved. Has Biotronik ICD. Tachy therapies turned on  - Continue mexiletine to 150 mg bid.   Persistent Atrial fibrillation:  - S/P DCCV 1/12. Remains in NSR - Continue amio 200 mg bid - on warfarin  Pulmonary hypertension: Severe combined pre/post capillary PH on RHC - resolved post VAD  CKD stage 3:  - Scr stable  Hypokalemia/ Hypomagnesemia  - supp PRN   HLD: continue atorvastatin  40 mg daily  Meningioma: Monitoring.   Prostate cancer: Treated with radiation.   Deconditioning: Per CIR team.  Anticipate discharge home on Monday, January 26th.  [name], NP 11/04/24, 10:48 AM  Advanced Heart Failure Team Pager [phone] (M-F; 7a - 5p)    Please visit [email or web address]: For overnight coverage please call cardiology fellow first. If fellow not available call Shock/ECMO MD on call.  For ECMO / Mechanical Support (Impella, IABP, LVAD) issues call Shock /  ECMO MD on call.   Patient seen and examined with the above-signed Advanced Practice Provider and/or Housestaff. I personally reviewed laboratory data, imaging studies and relevant notes. I independently examined the patient and formulated the important aspects of the plan. I have edited the note to reflect any of my changes or salient points. I have personally discussed the plan with the patient and/or family.  Doing well. Working with rehab and VAD coordinators.   Denis CP or SOB  General:  NAD.  HEENT:  normal  Neck: supple. JVP not elevated.  Carotids 2+ bilat; no bruits. No lymphadenopathy or thryomegaly appreciated. Cor: LVAD hum.  Lungs: Clear. Abdomen: soft, nontender, non-distended. No hepatosplenomegaly. No bruits or masses. Good bowel sounds. Driveline site clean. Anchor in place.  Extremities: no cyanosis, clubbing, rash. Warm no edema  Neuro: alert & oriented x 3. No focal deficits. Moves all 4 without problem   Doing well with rehab.   VAD interrogated personally. Parameters stable.  INR 2.2. No bleeding. Discussed warfarin dosing with PharmD personally.  Toribio Fuel, MD  2:22 PM

## 2024-11-04 NOTE — Progress Notes (Signed)
 LVAD Coordinator Rounding Note:  Admitted 10/01/24 for optimization prior to LVAD implant. Impella 5.5 placed 10/06/24.  HM 3 LVAD implanted on 10/14/24 by Dr James [name] under destination therapy criteria.  12/18: Milrinone  titrated from 0.25 to 0.5 mcg/kg/min post RHC d/t low-output and pulmonary hypertension 12/19: NE added 12/22: Markedly elevated PA pressures, elevated PCWP and elevated SVR. Titrated off NE. Added DBA.  12/23: Impella 5.5 Implanted.  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 Heparin  > Bival.  12/31: HM III LVAD implant 1/5: LVAD speed increased to 5400  1/6: LUE weakness, head CT negative  1/9: Ramp echo, speed increased to 5500 rpm 1/11: Off milrinone . 1/12: bedside TEE/DCCV. EF 20%, mod/sev HK. VAD speed decreased to 5400 14: transferred out of CCU  1/15: repeat head CT negative for acute changes. 1/19: Discharged to CIR  Pt sitting up in the recliner after therapy this morning. Denies complaints.   Pt continues to occasionally say things that do not make sense and he needs constant reinforcement.   Discharge education completed 10/28/24. Ongoing driveline dressing change teaching with pt's wife. Will plan to call James [name] with pt's therapy schedule on Friday morning to set up dressing change time.   Home VAD equipment ordered 1/19.  Vital signs: Temp: 98.6 HR: 66 Doppler Pressure: 70 Automatic BP: 94/72 (80) O2 Sat: 99% on RA Wt: 235.4>244.3>243.4>234.8>223.5>239.6>220.2>224.2>214>210.9>214>200.8>200.6 lb    LVAD interrogation reveals:  Speed: 5400 Flow: 5.0 Power: 3.9 w PI: 3.4   Alarms: none Events: none Hematocrit: 33  Fixed speed: 5400 Low speed limit: 5100   Drive Line: Existing VAD dressing removed and site care performed using sterile technique by VAD coordinator. Drive line exit site cleaned with Chlora prep applicators x 2, allowed to dry, and gauze dressing with Silverlon patch applied. Exit site unincorporated, the velour is fully implanted at exit site.  Moderate amount of serosanguinous drainage present on previous dressing. No redness, tenderness, foul odor or rash noted  Cath grip anchor replaced.Continue MWF dressing changes by VAD coordinator or nurse champion. Next dressing change due 11/06/24 by VAD coordinator or nurse champion.    Labs:  LDH trend: [phone]  INR trend: 1.3>1.4>1.4>1.6>1.7>1.7>2.0>2.1>2.2>2.3>2.2>1.8>1.7>2.0>2.0  Anticoagulation Plan: - INR Goal: 2.0 - 2.5 - Coumadin  dosing per pharmacy  Blood Products:  Intra-op 10/14/24:   4 FFP  2 platelets  700 cellsaver  DDAVP  x 2 Post-op:  10/15/24: 1 PRBC  Device: - Biotronik -Therapies: ON 10/29/24  Arrythmias: cardioverted from afib on 10/26/24  Respiratory: extubated 10/15/24  Infection:   Drips:  Milrinone  0.125 mcg/kg/min-off Amiodarone  30 mg/hr-off  Adverse Events on VAD:  Patient Education:  Completed 10/28/24. See separate note for details Ongoing dressing change teaching with pt's wife James [name]  Plan/Recommendations:  1. Page VAD coordinator for any equipment or drive line concerns 2. Continue Monday, Wednesday, Friday drive line dressing changes by VAD coordinator or nurse champion  James Knoll RN VAD Coordinator  Office: [phone]  24/7 Pager: [phone]

## 2024-11-05 LAB — BASIC METABOLIC PANEL WITH GFR
Anion gap: 8 (ref 5–15)
BUN: 21 mg/dL (ref 8–23)
CO2: 23 mmol/L (ref 22–32)
Calcium: 8.8 mg/dL — ABNORMAL LOW (ref 8.9–10.3)
Chloride: 101 mmol/L (ref 98–111)
Creatinine, Ser: 1.25 mg/dL — ABNORMAL HIGH (ref 0.61–1.24)
GFR, Estimated: >60 mL/min
Glucose, Bld: 89 mg/dL (ref 70–99)
Potassium: 4.7 mmol/L (ref 3.5–5.1)
Sodium: 132 mmol/L — ABNORMAL LOW (ref 135–145)

## 2024-11-05 LAB — CBC
HCT: 31.8 % — ABNORMAL LOW (ref 39.0–52.0)
Hemoglobin: 10.4 g/dL — ABNORMAL LOW (ref 13.0–17.0)
MCH: 31.1 pg (ref 26.0–34.0)
MCHC: 32.7 g/dL (ref 30.0–36.0)
MCV: 95.2 fL (ref 80.0–100.0)
Platelets: 196 K/uL (ref 150–400)
RBC: 3.34 MIL/uL — ABNORMAL LOW (ref 4.22–5.81)
RDW: 17.3 % — ABNORMAL HIGH (ref 11.5–15.5)
WBC: 7.8 K/uL (ref 4.0–10.5)
nRBC: 0 % (ref 0.0–0.2)

## 2024-11-05 LAB — GLUCOSE, CAPILLARY
Glucose-Capillary: 86 mg/dL (ref 70–99)
Glucose-Capillary: 141 mg/dL — ABNORMAL HIGH (ref 70–99)
Glucose-Capillary: 138 mg/dL — ABNORMAL HIGH (ref 70–99)
Glucose-Capillary: 115 mg/dL — ABNORMAL HIGH (ref 70–99)

## 2024-11-05 LAB — PROTIME-INR
INR: 2.6 — ABNORMAL HIGH (ref 0.8–1.2)
Prothrombin Time: 28.7 s — ABNORMAL HIGH (ref 11.4–15.2)

## 2024-11-05 MED ORDER — SPIRONOLACTONE 25 MG PO TABS
50.0000 mg | ORAL_TABLET | Freq: Every day | ORAL | Status: DC
  Administered 2024-11-06 – 2024-11-10 (×5): 50 mg via ORAL
  Filled 2024-11-05 (×5): qty 2

## 2024-11-05 MED ORDER — DAPAGLIFLOZIN PROPANEDIOL 5 MG PO TABS: 5.0000 mg | ORAL_TABLET | Freq: Every day | ORAL | Status: DC

## 2024-11-05 MED ORDER — WARFARIN SODIUM 3 MG PO TABS
3.0000 mg | ORAL_TABLET | Freq: Once | ORAL | Status: AC
  Administered 2024-11-05: 3 mg via ORAL
  Filled 2024-11-05: qty 1

## 2024-11-05 MED ORDER — DAPAGLIFLOZIN PROPANEDIOL 5 MG PO TABS
5.0000 mg | ORAL_TABLET | Freq: Every day | ORAL | Status: DC
  Administered 2024-11-06 – 2024-11-10 (×5): 5 mg via ORAL
  Filled 2024-11-05 (×5): qty 1

## 2024-11-05 MED ORDER — LORATADINE 10 MG PO TABS
10.0000 mg | ORAL_TABLET | Freq: Every day | ORAL | Status: AC
  Administered 2024-11-05 – 2024-11-09 (×5): 10 mg via ORAL
  Filled 2024-11-05 (×5): qty 1

## 2024-11-05 MED ORDER — AMOXICILLIN-POT CLAVULANATE 875-125 MG PO TABS
1.0000 | ORAL_TABLET | Freq: Two times a day (BID) | ORAL | Status: AC
  Administered 2024-11-05 – 2024-11-10 (×10): 1 via ORAL
  Filled 2024-11-05 (×10): qty 1

## 2024-11-05 NOTE — IPOC Note (Signed)
 Overall Plan of Care [facility]) Patient Details Name: Nolyn Swab MRN: [id number] DOB: 07-Dec-1960  Admitting Diagnosis: Debility  Hospital Problems: Principal Problem:   Debility     Functional Problem List: Nursing Edema, Endurance, Medication Management, Nutrition, Pain, Safety, Skin Integrity  PT Balance, Endurance, Motor, Pain, Safety  OT Balance, Safety, Endurance, Motor, Skin Integrity  SLP    TR         Basic ADLs: OT Bathing, Dressing, Toileting     Advanced  ADLs: OT None     Transfers: PT Bed Mobility, Bed to Chair, Customer Service Manager     Locomotion: PT Ambulation, Stairs     Additional Impairments: OT None  SLP        TR      Anticipated Outcomes Item Anticipated Outcome  Self Feeding no goal  Swallowing      Basic self-care  (S)  Toileting  mod I   Bathroom Transfers mod I  Bowel/Bladder  manage bowels with medications/ manage bladder withtoileting assistance  Transfers  Supervision  Locomotion  Supervision  Communication     Cognition     Pain  <4 w/ prns  Safety/Judgment  manage safety with mod I assistance   Therapy Plan: PT Intensity: Minimum of 1-2 x/day ,45 to 90 minutes PT Frequency: 5 out of 7 days PT Duration Estimated Length of Stay: 5-7 Days OT Intensity: Minimum of 1-2 x/day, 45 to 90 minutes OT Frequency: 5 out of 7 days OT Duration/Estimated Length of Stay: 5-7 days     Team Interventions: Nursing Interventions Patient/Family Education, Skin Care/Wound Management, Bladder Management, Bowel Management, Disease Management/Prevention, Pain Management, Medication Management  PT interventions Ambulation/gait training, Community reintegration, DME/adaptive equipment instruction, Neuromuscular re-education, Psychosocial support, Stair training, UE/LE Strength taining/ROM, Warden/ranger, Discharge planning, Therapeutic Activities, UE/LE Coordination activities, Cognitive remediation/compensation, Functional  mobility training, Patient/family education, Therapeutic Exercise, Pain management  OT Interventions Balance/vestibular training, Discharge planning, Pain management, Self Care/advanced ADL retraining, Therapeutic Activities, UE/LE Coordination activities, Functional mobility training, Therapeutic Exercise, DME/adaptive equipment instruction, Community reintegration, Skin care/wound managment, Patient/family education, Psychosocial support, UE/LE Strength taining/ROM  SLP Interventions    TR Interventions    SW/CM Interventions Discharge Planning, Psychosocial Support, Patient/Family Education   Barriers to Discharge MD  Medical stability, Home enviroment access/loayout, Wound care, Lack of/limited family support, Insurance for SNF coverage, and New LVAD  Nursing Decreased caregiver support, Home environment access/layout, Wound Care, Other (comments) Discharge: House  Discharge Home Layout: One level  Discharge Home Access: Stairs to enter  Entrance Stairs-Rails: Right Anticipated Industrial/product Designer Information: Wife works, adult daughter is CNA and can stay with pt while she works  PT      OT None    SLP      SW Decreased caregiver support, Lack of/limited family support, Community Education Officer for SNF coverage     Team Discharge Planning: Destination: PT-Home ,OT- Home , SLP-  Projected Follow-up: PT-Outpatient PT, OT-  None, SLP-  Projected Equipment Needs: PT-To be determined, OT- 3 in 1 bedside comode, SLP-  Equipment Details: PT- , OT-  Patient/family involved in discharge planning: PT- Patient,  OT-Patient, SLP-   MD ELOS: 5-7 days Medical Rehab Prognosis:  Excellent Assessment: The patient has been admitted for CIR therapies with the diagnosis of debility due to LVAD/CHF. The team will be addressing functional mobility, strength, stamina, balance, safety, adaptive techniques and equipment, self-care, bowel and bladder mgt, patient and caregiver education,  . Goals have been set at [facility].  Anticipated discharge destination is home.       See Team Conference Notes for weekly updates to the plan of care

## 2024-11-05 NOTE — Progress Notes (Signed)
 LVAD Coordinator Rounding Note:  Admitted 10/01/24 for optimization prior to LVAD implant. Impella 5.5 placed 10/06/24.  HM 3 LVAD implanted on 10/14/24 by Dr Lucas under destination therapy criteria.  12/18: Milrinone  titrated from 0.25 to 0.5 mcg/kg/min post RHC d/t low-output and pulmonary hypertension 12/19: NE added 12/22: Markedly elevated PA pressures, elevated PCWP and elevated SVR. Titrated off NE. Added DBA.  12/23: Impella 5.5 Implanted.  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 Heparin  > Bival.  12/31: HM III LVAD implant 1/5: LVAD speed increased to 5400  1/6: LUE weakness, head CT negative  1/9: Ramp echo, speed increased to 5500 rpm 1/11: Off milrinone . 1/12: bedside TEE/DCCV. EF 20%, mod/sev HK. VAD speed decreased to 5400 14: transferred out of CCU  1/15: repeat head CT negative for acute changes. 1/19: Discharged to CIR  Pt sitting up in the recliner after therapy this morning. Denies complaints. Reports no issues in managing his VAD equipment.   Pt seemed clear today during conversation. Oriented x 4.  Discharge education completed 10/28/24. Ongoing driveline dressing change teaching with pt's wife. Will plan to call [name] with pt's therapy schedule on Friday morning to set up dressing change time.   Home VAD equipment ordered 1/19.  Vital signs: Temp: 98.1 HR: 72 Doppler Pressure: 80 Automatic BP: 92/70 (84) O2 Sat: 99% on RA Wt: 235.4>244.3>243.4>234.8>223.5>239.6>220.2>224.2>214>210.9>214>200.8>200.6>201.6 lb    LVAD interrogation reveals:  Speed: 5400 Flow: 5.0 Power: 4.0 w PI: 3.1   Alarms: none Events: none Hematocrit: 33  Fixed speed: 5400 Low speed limit: 5100   Drive Line: Existing VAD dressing CDI. Anchor secure. Continue MWF dressing changes by VAD coordinator or nurse champion. Next dressing change due 11/06/24 by VAD coordinator or nurse champion.    Labs:  LDH trend: [phone]  INR trend:  1.3>1.4>1.4>1.6>1.7>1.7>2.0>2.1>2.2>2.3>2.2>1.8>1.7>2.0>2.0>2.6  Anticoagulation Plan: - INR Goal: 2.0 - 2.5 - Coumadin  dosing per pharmacy  Blood Products:  Intra-op 10/14/24:   4 FFP  2 platelets  700 cellsaver  DDAVP  x 2 Post-op:  10/15/24: 1 PRBC  Device: - Biotronik -Therapies: ON 10/29/24  Arrythmias: cardioverted from afib on 10/26/24  Respiratory: extubated 10/15/24  Infection:   Drips:  Milrinone  0.125 mcg/kg/min-off Amiodarone  30 mg/hr-off  Adverse Events on VAD:  Patient Education:  Completed 10/28/24. See separate note for details Ongoing dressing change teaching with pt's wife [name]  Plan/Recommendations:  1. Page VAD coordinator for any equipment or drive line concerns 2. Continue Monday, Wednesday, Friday drive line dressing changes by VAD coordinator or nurse champion  Schuyler Lunger RN, BSN VAD Coordinator 24/7 Pager [phone]

## 2024-11-05 NOTE — Progress Notes (Addendum)
 Patient ID: James [name], male   DOB: [birth year], 64 y.o.   MRN: [id number]     Advanced Heart Failure Rounding Note   AH[facility] Cardiologist: Dr. Zenaida Chief Complaint: Post-op LVAD Patient Profile   James [name] is a 64 y.o. male with history of stage D cardiomyopathy/chronic systolic heart failure now end-stage w/ inotrope dependence, CKD IIIa, hx VT, PAF, prostate cancer, meningioma.   Admitted for LVAD implant/ pre-VAD HF optimization.   Significant events:   12/18: Milrinone  titrated from 0.25 to 0.5 mcg/kg/min post RHC d/t low-output and pulmonary hypertension 12/19: NE added 12/22: Markedly elevated PA pressures, elevated PCWP and elevated SVR. Titrated off NE. Added DBA.  12/23: Impella 5.5 Implanted.  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 Heparin  > Bival.  12/31: HM III LVAD implant 1/5: LVAD speed increased to 5400  1/6: LUE weakness, head CT negative  1/9: Ramp echo, speed increased to 5500 rpm 1/11: Off milrinone . 1/12: bedside TEE/DCCV. EF 20%, mod/sev HK. VAD speed decreased to 5400 1/14: transferred out of CCU  1/15: repeat head CT negative for acute changes   Subjective:    S/P HM3 LVAD 10/14/24.  VSS. PT reports that he is walking 175 ft at a time, did get dizzy at one point while ambulating. No other complaints  LVAD INTERROGATION:  HeartMate IIl LVAD:  Flow 5.0 liters/min, speed 5400  power 4 PI 4.7  Objective:    Weight Range: 91.2 kg Body mass index is 23.25 kg/m.   Vital Signs:   Temp:  [98 F (36.7 C)-98.1 F (36.7 C)] 98.1 F (36.7 C) (01/21 2054) Pulse Rate:  [78-82] 82 (01/21 2054) Resp:  [17-18] 17 (01/21 2054) BP: (83-100)/(68-78) 92/70 (01/22 0845) SpO2:  [82 %-97 %] 97 % (01/21 2054) Weight:  [91.2 kg] 91.2 kg (01/22 0600) Last BM Date : 11/04/24  Doppler MAP: 80s  Weight change: Filed Weights   11/03/24 0600 11/05/24 0600  Weight: 91 kg 91.2 kg   Intake/Output:  Intake/Output Summary (Last 24 hours) at 11/05/2024 0911 Last data  filed at 11/05/2024 0728 Gross per 24 hour  Intake 712 ml  Output 700 ml  Net 12 ml    Physical Exam   General: Well appearing. No distress on RA Cardiac: JVP Flat. Mechanical heart sounds with LVAD hum present.  Driveline: Dressing C/D/I. No drainage or redness. Anchor in place. Extremities: Warm and dry. No edema. Neuro: Alert and oriented x3. Affect pleasant.   Labs   CBC Recent Labs    11/03/24 0459 11/04/24 0510 11/05/24 0554  WBC 9.1 9.1 7.8  NEUTROABS 7.5  --   --   HGB 11.2* 11.0* 10.4*  HCT 33.9* 34.0* 31.8*  MCV 95.2 96.0 95.2  PLT 211 204 196   Basic Metabolic Panel Recent Labs    98/79/73 1038 11/04/24 1000 11/05/24 0554  NA  --  133* 132*  K  --  5.0 4.7  CL  --  99 101  CO2  --  22 23  GLUCOSE  --  158* 89  BUN  --  21 21  CREATININE  --  1.31* 1.25*  CALCIUM   --  8.8* 8.8*  MG 2.0  --   --    Liver Function Tests Recent Labs    11/03/24 0459  AST 32  ALT 47*  ALKPHOS 114  BILITOT 0.9  PROT 6.3*  ALBUMIN  3.1*   BNP (last 3 results) Recent Labs    06/10/24 1213 07/09/24 1649 07/23/24 0959  BNP  1,071.5* 3,316.0* 595.8*   ProBNP (last 3 results) Recent Labs    10/15/24 0400 10/21/24 0433 10/28/24 0559  PROBNP 9,508.0* 5,277.0* 8,170.0*   Medications:    Scheduled Medications:  amiodarone   200 mg Oral BID   atorvastatin   40 mg Oral Daily   Chlorhexidine  Gluconate Cloth  6 each Topical Daily   collagenase    Topical Daily   digoxin   0.0625 mg Oral Daily   furosemide   40 mg Oral Daily   gabapentin   200 mg Oral QHS   insulin  aspart  0-15 Units Subcutaneous TID WC   mexiletine  150 mg Oral Q12H   multivitamin with minerals  1 tablet Oral Daily   ofloxacin   5 drop Left EAR Daily   pantoprazole   40 mg Oral Daily   senna-docusate  2 tablet Oral QHS   sildenafil   20 mg Oral TID   sodium chloride  flush  10-40 mL Intracatheter Q12H   spironolactone   50 mg Oral BID   Warfarin - Pharmacist Dosing Inpatient   Does not apply q1600    zinc  sulfate (50mg  elemental zinc )  220 mg Oral Q supper    Infusions:    PRN Medications: acetaminophen , alum & mag hydroxide-simeth, bisacodyl , diphenhydrAMINE , guaiFENesin -dextromethorphan , mouth rinse, oxyCODONE , phenol, prochlorperazine  **OR** prochlorperazine  **OR** prochlorperazine , senna, sodium phosphate  Assessment/Plan   Acute on chronic systolic CHF, NYHA IV: Nonischemic cardiomyopathy. RHC 12/18 showed elevated filling pressures with mod-severe mixed pulmonary arterial/pulmonary venous hypertension and low CO despite milrinone  0.25, increased to 0.5. Impella 5.5 placed 12/23 for further optimization. - S/p HM III LVAD implant by Dr. Lucas 10/14/24. - iNO off 1/1; Milrinone  off 1/11. Co-ox stable.  - Ramp echo 1/9, increased speed to 5500 rpm. TEE 1/12 decreased speed to 5400. - Volume ok. Stop lasix  with dizziness - Continue sildenafil  20 mg TID for RV support - Continue digoxin  0.0625 mg daily. Dig level 0.9 1/13 - Decrease spironolactone  to 50 mg daily - MAPs 80s - INR 2.6 Discussed warfarin dosing with PharmD personally.  PVCs/NSVT: Improved. Has Biotronik ICD. Tachy therapies turned on  - Continue mexiletine to 150 mg bid.   Persistent Atrial fibrillation:  - S/P DCCV 1/12. Remains in NSR - Continue amio 200 mg bid - on warfarin  Pulmonary hypertension: Severe combined pre/post capillary PH on RHC - resolved post VAD  CKD stage 3:  - Scr stable  Hypokalemia/ Hypomagnesemia  - resolved  HLD: continue atorvastatin  40 mg daily  Meningioma: Monitoring.   Prostate cancer: Treated with radiation.   Deconditioning: Per CIR team.  Anticipate discharge home on Monday, January 26th.  [name], NP 11/05/24, 9:11 AM  Advanced Heart Failure Team Pager [phone] (M-F; 7a - 5p)    Please visit [email or web address]: For overnight coverage please call cardiology fellow first. If fellow not available call Shock/ECMO MD on call.  For ECMO / Mechanical Support  (Impella, IABP, LVAD) issues call Shock / ECMO MD on call.   Patient seen and examined with the above-signed Advanced Practice Provider and/or Housestaff. I personally reviewed laboratory data, imaging studies and relevant notes. I independently examined the patient and formulated the important aspects of the plan. I have edited the note to reflect any of my changes or salient points. I have personally discussed the plan with the patient and/or family.  Continues to work with PT. Walking up to 160 feet at a time. Feeling stronger. Beginning to have episdodes of orthostasis.  General:  NAD.  HEENT: normal  Neck: supple. JVP not  elevated.  Carotids 2+ bilat; no bruits. No lymphadenopathy or thryomegaly appreciated. Cor: LVAD hum.  Lungs: Clear. Abdomen: soft, nontender, non-distended. No hepatosplenomegaly. No bruits or masses. Good bowel sounds. Driveline site clean. Anchor in place.  Extremities: no cyanosis, clubbing, rash. Warm no edema  Neuro: alert & oriented x 3. No focal deficits. Moves all 4 without problem   Becoming orthostatic. Stop lasix  and cut back spiro.   INR 2.6 Discussed warfarin dosing with PharmD personally.  VAD interrogated personally. Parameters stable.  Toribio Fuel, MD  9:29 PM

## 2024-11-05 NOTE — Progress Notes (Signed)
 PHARMACY - ANTICOAGULATION CONSULT NOTE  Pharmacy Consult for warfarin Indication: LVAD HM3 + hx AFib  Allergies[1]  Patient Measurements: Height: 6' 6 (198.1 cm) Weight: 91.2 kg (201 lb 2.6 oz) IBW/kg (Calculated) : 91.4  Vital Signs: BP: 92/70 (01/22 0845)  Labs: Recent Labs    11/03/24 0459 11/04/24 0510 11/04/24 1000 11/05/24 0554  HGB 11.2* 11.0*  --  10.4*  HCT 33.9* 34.0*  --  31.8*  PLT 211 204  --  196  LABPROT 23.4* 25.5*  --  28.7*  INR 2.0* 2.2*  --  2.6*  CREATININE 1.17  --  1.31* 1.25*    Estimated Creatinine Clearance: 78 mL/min (A) (by C-G formula based on SCr of 1.25 mg/dL (H)).   Medical History: Past Medical History:  Diagnosis Date   CKD (chronic kidney disease) stage 2, GFR 60-89 ml/min    COPD (chronic obstructive pulmonary disease) (HCC)    Diabetes mellitus type II, non insulin  dependent (HCC)    Dilated aortic root    Elevated PSA    Hypercholesteremia    Hypertension    NICM (nonischemic cardiomyopathy) (HCC)    NSVT (nonsustained ventricular tachycardia) (HCC)    Obstructive sleep apnea    Paroxysmal atrial fibrillation (HCC)    Pulmonary hypertension (HCC)    PVC's (premature ventricular contractions)    Tobacco abuse     Medications:  Scheduled:   amiodarone   200 mg Oral BID   atorvastatin   40 mg Oral Daily   Chlorhexidine  Gluconate Cloth  6 each Topical Daily   collagenase    Topical Daily   digoxin   0.0625 mg Oral Daily   furosemide   40 mg Oral Daily   gabapentin   200 mg Oral QHS   insulin  aspart  0-15 Units Subcutaneous TID WC   mexiletine  150 mg Oral Q12H   multivitamin with minerals  1 tablet Oral Daily   ofloxacin   5 drop Left EAR Daily   pantoprazole   40 mg Oral Daily   senna-docusate  2 tablet Oral QHS   sildenafil   20 mg Oral TID   sodium chloride  flush  10-40 mL Intracatheter Q12H   spironolactone   50 mg Oral BID   warfarin  3 mg Oral ONCE-1600   Warfarin - Pharmacist Dosing Inpatient   Does not apply  q1600   zinc  sulfate (50mg  elemental zinc )  220 mg Oral Q supper    Assessment: 63 yom who underwent LVAD HM3 placement on 12/31. Was on apixaban  PTA for hx Afib > now with LVAD plan to change to warfarin.   INR slightly above goal at 2.6. Hgb 10.4, plt 196. No s/sx of bleeding. Oral intake documented at 60% yesterday for 1 meal but has endorsed eating better.   Goal of Therapy:  INR 2-2.5 Monitor platelets by anticoagulation protocol: Yes   Plan:  Warfarin 3 mg x 1 tonight. Monitor daily INR, CBC, and for s/sx of bleeding  F/u PO intake  Thank you for allowing pharmacy to participate in this patient's care,  Harlene Denna Berdine JONETTA ARABELLA, [employer] Clinical Pharmacist  11/05/2024 10:57 AM   [facility] pharmacy phone numbers are listed on [email or web address]      [1] No Known Allergies

## 2024-11-05 NOTE — Progress Notes (Signed)
 "                                                        PROGRESS NOTE   Subjective/Complaints:  No events overnight.  Vitals are stable.  Gel mattress much more comfortable.   A.m. labs stable, mild hyponatremia 132, creatinine improving 1.25, hemoglobin stable  Patient continues to complain of pain in his left ear  ROS: Denies fevers, chills, N/V, abdominal pain, constipation, diarrhea, SOB, cough, chest pain, new weakness or paraesthesias.   + Left ear pain--ongoing   Objective:   No results found. Recent Labs    11/04/24 0510 11/05/24 0554  WBC 9.1 7.8  HGB 11.0* 10.4*  HCT 34.0* 31.8*  PLT 204 196   Recent Labs    11/04/24 1000 11/05/24 0554  NA 133* 132*  K 5.0 4.7  CL 99 101  CO2 22 23  GLUCOSE 158* 89  BUN 21 21  CREATININE 1.31* 1.25*  CALCIUM  8.8* 8.8*    Intake/Output Summary (Last 24 hours) at 11/05/2024 1640 Last data filed at 11/05/2024 9271 Gross per 24 hour  Intake 476 ml  Output 600 ml  Net -124 ml     Wound 11/02/24 1719 Pressure Injury Sacrum Medial Deep Tissue Pressure Injury - Purple or maroon localized area of discolored intact skin or blood-filled blister due to damage of underlying soft tissue from pressure and/or shear. (Active)    Physical Exam: Vital Signs Blood pressure 111/89, pulse 72, temperature 98.2 F (36.8 C), temperature source Oral, resp. rate 17, height 6' 6 (1.981 m), weight 91.2 kg, SpO2 100%. Constitutional: No apparent distress. Appropriate appearance for age.  Sitting in chair.   HENT: No JVD. Neck Supple. Trachea midline. Atraumatic, normocephalic. + Now bulging in the right lower part of the eardrum; some fluid present behind  Eyes: PERRLA. EOMI. Visual fields grossly intact.  Cardiovascular: Audible electronic thrum  No Edema.   LVAD parameters: - Speed 5400 -Flow 4.2 -PI 4.6 -Power 3.8  Respiratory: CTAB. No rales, rhonchi, or wheezing. On RA.  Abdomen: + bowel sounds, normoactive. No distention or  tenderness.  Skin: Driveline site clean/dry/intact.  Remaining sutures on abdomen Sacral wound; unable to examine   MSK:      No apparent deformity.      Strength:                RUE: 5/5 SA, 5/5 EF, 5/5 EE, 5/5 WE, 5/5 FF, 5/5 FA                 LUE: 5/5 SA, 5/5 EF, 5/5 EE, 5/5 WE, 5/5 FF, 5/5 FA                 RLE: 5/5 HF, 5/5 KE, 5/5 DF, 5/5 EHL, 5/5 PF                 LLE:  5/5 HF, 5/5 KE, 5/5 DF, 5/5 EHL, 5/5 PF   Neurologic exam:  Cognition: AAO to person, place, time and event.  Language: Fluent, No substitutions or neoglisms. No dysarthria.  Memory: No apparent deficits Insight: Good  insight into current condition.  Mood: Pleasant affect, appropriate mood.  Sensation: Equal and intact in BL UE and Les.  Reflexes: 2+ in BL UE and LEs. Negative Hoffman's and babinski signs  bilaterally.  CN: 2-12 grossly intact.  Coordination: No apparent tremors. No ataxia on FTN, HTS bilaterally. Spasticity: MAS 0 in all extremities.      Assessment/Plan: 1. Functional deficits which require 3+ hours per day of interdisciplinary therapy in a comprehensive inpatient rehab setting. Physiatrist is providing close team supervision and 24 hour management of active medical problems listed below. Physiatrist and rehab team continue to assess barriers to discharge/monitor patient progress toward functional and medical goals  Care Tool:  Bathing    Body parts bathed by patient: Right arm, Left arm, Chest, Abdomen, Buttocks, Right upper leg, Left upper leg, Right lower leg, Front perineal area, Face, Left lower leg         Bathing assist Assist Level: Contact Guard/Touching assist     Upper Body Dressing/Undressing Upper body dressing   What is the patient wearing?: Pull over shirt    Upper body assist Assist Level: Supervision/Verbal cueing    Lower Body Dressing/Undressing Lower body dressing      What is the patient wearing?: Pants     Lower body assist Assist for lower  body dressing: Minimal Assistance - Patient > 75%     Toileting Toileting    Toileting assist Assist for toileting: Minimal Assistance - Patient > 75%     Transfers Chair/bed transfer  Transfers assist     Chair/bed transfer assist level: Contact Guard/Touching assist     Locomotion Ambulation   Ambulation assist      Assist level: Minimal Assistance - Patient > 75% Assistive device: No Device Max distance: 65'   Walk 10 feet activity   Assist     Assist level: Minimal Assistance - Patient > 75% Assistive device: No Device   Walk 50 feet activity   Assist    Assist level: Minimal Assistance - Patient > 75% Assistive device: No Device    Walk 150 feet activity   Assist Walk 150 feet activity did not occur: Safety/medical concerns         Walk 10 feet on uneven surface  activity   Assist     Assist level: Minimal Assistance - Patient > 75%     Wheelchair     Assist Is the patient using a wheelchair?: Yes Type of Wheelchair: Manual    Wheelchair assist level: Dependent - Patient 0% Max wheelchair distance: 150'    Wheelchair 50 feet with 2 turns activity    Assist        Assist Level: Dependent - Patient 0%   Wheelchair 150 feet activity     Assist      Assist Level: Dependent - Patient 0%   Blood pressure 111/89, pulse 72, temperature 98.2 F (36.8 C), temperature source Oral, resp. rate 17, height 6' 6 (1.981 m), weight 91.2 kg, SpO2 100%.  Medical Problem List and Plan: 1. Functional deficits secondary to debility related CHF/ LVAD placement             -patient may  shower             -ELOS/Goals: 5-7 days, mod I  - Stable to continue inpatient rehab  2.  Antithrombotics: -DVT/anticoagulation:  Mechanical:  Antiembolism stockings, knee (TED hose) Bilateral lower extremities              -antiplatelet therapy: Was on apixaban  PTA for hx Afib > now with LVAD on warfarin  INR Goal: 2.0 - 2.5  coumadin   dosing per pharmacy.   3. Pain Management: Gabapentin   200 mg at bedtime. PRN: tylenol  and oxycodone    - 1-20: Pain well-controlled on current regimen.  4. Mood/Behavior/Sleep: LCSW to follow for evaluation and support when available.              -antipsychotic agents: n/a  5. Neuropsych/cognition: This patient is capable of making decisions on his own behalf. 6. Skin/Wound Care: Routine pressure relief measures. Continue sternal precautions.  Drive Line: Continue MWF dressing changes by VAD probation officer. Next dressing change due 11/04/24 by VAD coordinator or nurse champion. -Sacral wound: Continue Santyl  oint apply to yellow portion of sacral wound daily. Pressure relief, encourage appropriate nutrition - 1-20: Has had abdominal sutures since 12-31.  Will DC --ordered 1/21  - 1/21: Switched from LAL to gel mattress with frequent rolling for patient comfort--improved   7. Fluids/Electrolytes/Nutrition: Monitor strict I&O and daily weight.              -GERD: Protonix               -Ensure and vitamin supplements    8. Acute on chronic systolic CHF, NYHA IV: S/p HM III LVAD implant by Dr. Lucas 10/14/24. Advanced HF team following.              - Continue Lasix  40 mg daily with potassium 40 mEq, Sildenafil  20 mg TID for RV support. Digoxin  0.0625 mg daily. Dig level 0.9 1/13. Spironolactone  50 mg bid. Warfarin per pharmacy dosing.              -daily weights  - 11-03-20: LVAD parameters stable, no d/d volume overload, appreciate cardiology recommendations Filed Weights   11/03/24 0600 11/05/24 0600  Weight: 91 kg 91.2 kg    9. Pulmonary hypertension: Severe combined pre/post capillary PH on RHC-resolved    10. AKI on CKD stage 3: Scr stable, follow BMP in AM.    - 1-20: Mild increase in creatinine, encourage p.o. fluids.  Continue to monitor--improving 1-22  11. PVCs/NSVT: Improved. Has Biotronik ICD.              -Continue mexiletine 150 mg bid.    12. Persistent  Atrial fibrillation: s/p DCCV 1/12, currently NSR.              -Continue amiodarone  200 mg bid and warfarin.    13. ABLA: Hgb 10.7, Monitor daily INR, CBC, and for s/sx of bleeding.    - 1-20: Hemoglobin stable 11 14. Hypokalemia/Hypomagnesemia: On potassium 40 mEq, continue supplementation PRN.   - 1-20: Magnesium  2.0, potassium 4.6 on admission labs.  Monitor.  15. T2DM: A1c 6.0-- Holding home Metformin and Farxiga . Insulin  reqs significantly decreased since PO intake.  -Continue monitoring glucose ac/hs with SSI as needed.    - Resume oral medications as appropriate  -1/22: Creatinine improving.  P.o. intakes remain variable, 10 to 100%.  Resume Farxiga  5 mg daily for cardiac indications, monitor.  Recent Labs    11/04/24 2111 11/05/24 0601 11/05/24 1202  GLUCAP 128* 86 141*     16. Constipation: Small recorded on 1/19, pt still endrosing constipation.              -Miralax  now BID, Senokot-S at bedtime.  -give 60 cc sorbitol  tonight.  -PRN senokot and sorbitol .  - LBM 1/21, medium   17. Prostate cancer: s/p radiation.    18. Meningioma: Monitoring.    19.  Left ear pain.     - 1/20: Start Oxifloxacin 5 drops to the left ear  daily for 7 days  - 1/22: Now seems some bulging behind tympanic membrane.  DC oxaflozane since, start Augmentin  875 every 12 hours for 5 days, add Claritin  10 mg daily for 5 days to clear sinuses and allow drainage    LOS: 3 days A FACE TO FACE EVALUATION WAS PERFORMED  Joesph JAYSON Likes 11/05/2024, 4:40 PM     "

## 2024-11-05 NOTE — Progress Notes (Signed)
 Physical Therapy Session Note  Patient Details  Name: James [name] MRN: [id number] Date of Birth: [birth year]  Today's Date: 11/05/2024 PT Individual Time: 0902-0945 PT Individual Time Calculation (min): 43 min   Today's Date: 11/05/2024 PT Individual Time: 8697-8654 PT Individual Time Calculation (min): 43 min   Short Term Goals: Week 1:  PT Short Term Goal 1 (Week 1): STGs = LTGs  Skilled Therapeutic Interventions/Progress Updates:     Pt received seated in [facility] and agrees to therapy. No complaint of pain and reports sleeping better last night. Pt's wife scheduled for family education this session but is not present. WC transport to gym. Pt performs sit to stand with CGA and cues for initiation. Pt ambulates x150' with CGA and cues to increase stride length and gait speed to improve balance. PT provides cues for sequencing and positioning for safe transition back to WC. Pt ambulates additional 3x175' with similar assistance and cues, with extended seated rest breaks in between each bout. Pt's modified RPE is 7/10 following each bout. WC transport back to room. Pt left seated in WC with all needs within reach.  2nd Session: Pt received seated in recliner and agrees to therapy. No complaint of pain. Pt performs sit to stand with rollator and cues for hand placement and body mechanics. Pt ambulates x100' with rollator and cues for upright posture to improve balance and body mechanics. Pt takes seated rest break. Pt then ambulates x175' with rollator and same cues. Pt completes Nustep for endurance training, with only lower extremities to ensure pt maintains sternal precautions. Pt completes 2x6:00 at workload of 5 with average steps per minute ~57. Pt verbalizes significant fatigue in legs following Nustep, and requests WC transport back to room. Stand step from nustep>WC>recliner with cues for positioning. Pt left seated in recliner with all needs within reach.   Therapy  Documentation Precautions:  Precautions Precautions: Fall Precaution/Restrictions Comments: LVAD Restrictions Weight Bearing Restrictions Per Provider Order: No RUE Weight Bearing Per Provider Order: Non weight bearing LUE Weight Bearing Per Provider Order: Non weight bearing Other Position/Activity Restrictions: sternal precautions  Therapy/Group: Individual Therapy  [name], PT, DPT 11/05/2024, 2:35 PM

## 2024-11-05 NOTE — Discharge Instructions (Addendum)
 Inpatient Rehab Discharge Instructions  James [name]  Discharge date and time: 11/10/2024  Activities/Precautions/ Functional Status:  Activity: no lifting, driving, or strenuous exercise for until cleared by provider.  Diet: regular diet  Wound Care: as directed   Functional status:  ___ No restrictions     ___ Walk up steps independently ___ 24/7 supervision/assistance   ___ Walk up steps with assistance _X__ Intermittent supervision/assistance  ___ Bathe/dress independently ___ Walk with walker     ___ Bathe/dress with assistance ___ Walk Independently    ___ Shower independently ___ Walk with assistance    ___ Shower with assistance __X_ No alcohol     ___ Return to work/school ________  Special Instructions:  -Continue Monday, Wednesday, Friday drive line dressing changes.    My questions have been answered and I understand these instructions. I will adhere to these goals and the provided educational materials after my discharge from the hospital.  Patient/Caregiver Signature _______________________________ Date __________  Clinician Signature _______________________________________ Date __________  Please bring this form and your medication list with you to all your follow-up doctor's appointments.     Information on my medicine - Coumadin    (Warfarin)  This medication education was reviewed with me or my healthcare representative as part of my discharge preparation.  The pharmacist that spoke with me during my hospital stay was:  Carney, Harlene BROCKS, [facility]  Why was Coumadin  prescribed for you? Coumadin  was prescribed for you because you have a blood clot or a medical condition that can cause an increased risk of forming blood clots. Blood clots can cause serious health problems by blocking the flow of blood to the heart, lung, or brain. Coumadin  can prevent harmful blood clots from forming. As a reminder your indication for Coumadin  is:  Blood Clot Prevention  after Heart Pump Surgery  What test will check on my response to Coumadin ? While on Coumadin  (warfarin) you will need to have an INR test regularly to ensure that your dose is keeping you in the desired range. The INR (international normalized ratio) number is calculated from the result of the laboratory test called prothrombin time (PT).  If an INR APPOINTMENT HAS NOT ALREADY BEEN MADE FOR YOU please schedule an appointment to have this lab work done by your health care provider within 7 days. Your INR goal is usually a number between: 2-2.5.  Ask your health care provider during an office visit what your goal INR is.  What  do you need to  know  About  COUMADIN ? Take Coumadin  (warfarin) exactly as prescribed by your healthcare provider about the same time each day.  DO NOT stop taking without talking to the doctor who prescribed the medication.  Stopping without other blood clot prevention medication to take the place of Coumadin  may increase your risk of developing a new clot or stroke.  Get refills before you run out.  What do you do if you miss a dose? If you miss a dose, take it as soon as you remember on the same day then continue your regularly scheduled regimen the next day.  Do not take two doses of Coumadin  at the same time.  Important Safety Information A possible side effect of Coumadin  (Warfarin) is an increased risk of bleeding. You should call your healthcare provider right away if you experience any of the following: Bleeding from an injury or your nose that does not stop. Unusual colored urine (red or dark [place]) or unusual colored stools (red or  black). Unusual bruising for unknown reasons. A serious fall or if you hit your head (even if there is no bleeding).  Some foods or medicines interact with Coumadin  (warfarin) and might alter your response to warfarin. To help avoid this: Eat a balanced diet, maintaining a consistent amount of Vitamin K. Notify your provider about  major diet changes you plan to make. Avoid alcohol or limit your intake to 1 drink for women and 2 drinks for men per day. (1 drink is 5 oz. wine, 12 oz. beer, or 1.5 oz. liquor.)  Make sure that ANY health care provider who prescribes medication for you knows that you are taking Coumadin  (warfarin).  Also make sure the healthcare provider who is monitoring your Coumadin  knows when you have started a new medication including herbals and non-prescription products.  Coumadin  (Warfarin)  Major Drug Interactions  Increased Warfarin Effect Decreased Warfarin Effect  Alcohol (large quantities) Antibiotics (esp. Septra/Bactrim, Flagyl, Cipro) Amiodarone  (Cordarone ) Aspirin  (ASA) Cimetidine (Tagamet) Megestrol (Megace) NSAIDs (ibuprofen, naproxen, etc.) Piroxicam (Feldene) Propafenone (Rythmol SR) Propranolol (Inderal) Isoniazid (INH) Posaconazole (Noxafil) Barbiturates (Phenobarbital) Carbamazepine (Tegretol) Chlordiazepoxide (Librium) Cholestyramine (Questran) Griseofulvin Oral Contraceptives Rifampin  Sucralfate (Carafate) Vitamin K   Coumadin  (Warfarin) Major Herbal Interactions  Increased Warfarin Effect Decreased Warfarin Effect  Garlic Ginseng Ginkgo biloba Coenzyme Q10 Green tea St. Johns wort    Coumadin  (Warfarin) FOOD Interactions  Eat a consistent number of servings per week of foods HIGH in Vitamin K (1 serving =  cup)  Collards (cooked, or boiled & drained) Kale (cooked, or boiled & drained) Mustard greens (cooked, or boiled & drained) Parsley *serving size only =  cup Spinach (cooked, or boiled & drained) Swiss chard (cooked, or boiled & drained) Turnip greens (cooked, or boiled & drained)  Eat a consistent number of servings per week of foods MEDIUM-HIGH in Vitamin K (1 serving = 1 cup)  Asparagus (cooked, or boiled & drained) Broccoli (cooked, boiled & drained, or raw & chopped) Brussel sprouts (cooked, or boiled & drained) *serving size only =   cup Lettuce, raw (green leaf, endive, romaine) Spinach, raw Turnip greens, raw & chopped   These websites have more information on Coumadin  (warfarin):  www.coumadin .com; www.ahrq.gov/consumer/coumadin .htm;

## 2024-11-05 NOTE — Progress Notes (Signed)
 Occupational Therapy Session Note  Patient Details  Name: James [name] MRN: [id number] Date of Birth: December 11, 1960  Today's Date: 11/05/2024 OT Individual Time: 1005-1100 OT Individual Time Calculation (min): 55 min   Today's Date: 11/05/2024 OT Individual Time: 1450-1530 OT Individual Time Calculation (min): 40 min   Short Term Goals: Week 1:  OT Short Term Goal 1 (Week 1): STG= LTG d/t ELOS  Skilled Therapeutic Interventions/Progress Updates:   Session 1: Pt greeted sitting in [facility] for skilled OT session with focus on discharge planning.   Pain: Pt with un-rated pain in sacral pain and L -ear discomfort. OT offering intermediate rest breaks and positioning suggestions throughout session to address pain/fatigue and maximize participation/safety in session.   Functional Transfers: Pt dependent for WC transport from room<>ADL apartment. Rollator introduced for progress towards LRAD. Pt ambulates room-level distance with supervision + min cues for safe brake management and use of seat.   Self Care Tasks/Education: Majority of session focused on reviewing 4Ps of energy conservation. Verbal examples provided for simple IADL modifications, pt receptive to information provided.   Pt remained sitting in WC with 4Ps assessed and immediate needs met. Pt continues to be appropriate for skilled OT intervention to promote further functional independence in ADLs/IADLs.   Session 2: Pt greeted sitting in recliner for skilled OT session with focus on functional transfers and BLE strengthening for carryover into BADL/functional transfer independence.   Pain: Pt reported 8/10 sacral pain, OT offering intermediate rest breaks and positioning suggestions throughout session to address pain/fatigue and maximize participation/safety in session. Re-educated on the importance of pressure relieving during the day.   Functional Transfers: Stand-step with CGA + no AD, recliner<>WC. Pt dependent for WC  transport from room<> [address] for change of scenery.   Therapeutic Exercise: Pt instructed in series of BLE strengthening exercises, details below: 1 x 1 min cycle of heel raises 1 x 1 min cycle of toe raises 1 x 1 min cycle of marches 1 x 1 min cycle of leg extensions ~25 ft of WC propulsion with BLE   Pt performs the exercises with 2.5# ankle weights donned and multimodal cues provided for correct form/safe pacing.   Pt remained sitting in recliner with 4Ps assessed and immediate needs met. Pt continues to be appropriate for skilled OT intervention to promote further functional independence in ADLs/IADLs.   Therapy Documentation Precautions:  Precautions Precautions: Fall Precaution/Restrictions Comments: LVAD Restrictions Weight Bearing Restrictions Per Provider Order: No RUE Weight Bearing Per Provider Order: Non weight bearing LUE Weight Bearing Per Provider Order: Non weight bearing Other Position/Activity Restrictions: sternal precautions   Therapy/Group: Individual Therapy  Nereida Habermann, OTR/L, MSOT  11/05/2024, 6:17 AM

## 2024-11-06 LAB — PROTIME-INR
INR: 2.7 — ABNORMAL HIGH (ref 0.8–1.2)
Prothrombin Time: 29.6 s — ABNORMAL HIGH (ref 11.4–15.2)

## 2024-11-06 LAB — CBC
HCT: 33.4 % — ABNORMAL LOW (ref 39.0–52.0)
Hemoglobin: 10.9 g/dL — ABNORMAL LOW (ref 13.0–17.0)
MCH: 30.9 pg (ref 26.0–34.0)
MCHC: 32.6 g/dL (ref 30.0–36.0)
MCV: 94.6 fL (ref 80.0–100.0)
Platelets: 212 K/uL (ref 150–400)
RBC: 3.53 MIL/uL — ABNORMAL LOW (ref 4.22–5.81)
RDW: 17.5 % — ABNORMAL HIGH (ref 11.5–15.5)
WBC: 7.1 K/uL (ref 4.0–10.5)
nRBC: 0 % (ref 0.0–0.2)

## 2024-11-06 LAB — GLUCOSE, CAPILLARY
Glucose-Capillary: 92 mg/dL (ref 70–99)
Glucose-Capillary: 136 mg/dL — ABNORMAL HIGH (ref 70–99)
Glucose-Capillary: 94 mg/dL (ref 70–99)
Glucose-Capillary: 133 mg/dL — ABNORMAL HIGH (ref 70–99)

## 2024-11-06 LAB — ALDOSTERONE + RENIN ACTIVITY W/ RATIO
ALDO / PRA Ratio: 2.3 (ref 0.0–20.0)
Aldosterone: 15.4 ng/dL (ref 0.0–30.0)
PRA LC/MS/MS: 6.758 ng/mL/h — ABNORMAL HIGH (ref 0.167–5.380)

## 2024-11-06 MED ORDER — OXYCODONE HCL 5 MG PO TABS
5.0000 mg | ORAL_TABLET | Freq: Four times a day (QID) | ORAL | Status: DC | PRN
  Administered 2024-11-06 – 2024-11-09 (×6): 5 mg via ORAL
  Filled 2024-11-06 (×6): qty 1

## 2024-11-06 MED ORDER — WARFARIN SODIUM 3 MG PO TABS
3.0000 mg | ORAL_TABLET | Freq: Once | ORAL | Status: AC
  Administered 2024-11-06: 3 mg via ORAL
  Filled 2024-11-06: qty 1

## 2024-11-06 NOTE — Progress Notes (Signed)
 Occupational Therapy Discharge Summary  Patient Details  Name: James [name] MRN: [id number] Date of Birth: 1961-09-15  Date of Discharge from OT service:November 09, 2024   Patient has met 6 of 6 long term goals due to improved activity tolerance, improved balance, postural control, ability to compensate for deficits, improved awareness, and improved coordination.  Patient to discharge at overall Supervision level.  Patient's care partner is independent to provide the necessary physical assistance at discharge.     Recommendation:  Patient will benefit from ongoing skilled OT services in outpatient setting to continue to advance functional skills in the area of BADL and iADL.  Equipment: BSC  Reasons for discharge: treatment goals met and discharge from hospital  Patient/family agrees with progress made and goals achieved: Yes  OT Discharge Precautions/Restrictions  Precautions Precautions: Fall Precaution Booklet Issued: No Precaution/Restrictions Comments: LVAD Restrictions Weight Bearing Restrictions Per Provider Order: No Other Position/Activity Restrictions: sternal precautions   ADL ADL Eating: Independent Where Assessed-Eating: Wheelchair Grooming: Independent Where Assessed-Grooming: Sitting at sink Upper Body Bathing: Supervision/safety Where Assessed-Upper Body Bathing: Sitting at sink Lower Body Bathing: Supervision/safety Where Assessed-Lower Body Bathing: Sitting at sink Upper Body Dressing: Modified independent (Device) Where Assessed-Upper Body Dressing: Sitting at sink Lower Body Dressing: Supervision/safety Where Assessed-Lower Body Dressing: Edge of bed Toileting: Supervision/safety Where Assessed-Toileting: Toilet, Bedside Commode Toilet Transfer: Distant supervision Toilet Transfer Method: Ambulating Tub/Shower Transfer: Unable to assess Tub/Shower Transfer Method: Unable to assess Film/video Editor: Unable to assess Vision Baseline  Vision/History: 1 Wears glasses Patient Visual Report: No change from baseline Vision Assessment?: No apparent visual deficits Perception  Perception: Within Functional Limits Praxis Praxis: WFL Cognition Cognition Overall Cognitive Status: Within Functional Limits for tasks assessed Arousal/Alertness: Awake/alert Orientation Level: Person;Place;Situation Person: Oriented Place: Oriented Situation: Oriented Memory: Appears intact Awareness: Appears intact Problem Solving: Appears intact Safety/Judgment: Appears intact Comments: Cognition varies and often pt will have intermittent moments of questionable confusion Brief Interview for Mental Status (BIMS) Repetition of Three Words (First Attempt): 3 Temporal Orientation: Year: Correct Temporal Orientation: Month: Accurate within 5 days Temporal Orientation: Day: Correct BIMS Summary Score: 99 Sensation Sensation Light Touch: Appears Intact Coordination Gross Motor Movements are Fluid and Coordinated: Yes Fine Motor Movements are Fluid and Coordinated: Yes Motor  Motor Motor: Within Functional Limits Mobility  Bed Mobility Bed Mobility: Sit to Supine;Supine to Sit Supine to Sit: Supervision/Verbal cueing Sit to Supine: Supervision/Verbal cueing Transfers Sit to Stand: Supervision/Verbal cueing Stand to Sit: Supervision/Verbal cueing  Trunk/Postural Assessment  Cervical Assessment Cervical Assessment: Within Functional Limits Thoracic Assessment Thoracic Assessment: Within Functional Limits Lumbar Assessment Lumbar Assessment: Within Functional Limits Postural Control Postural Control: Within Functional Limits  Balance Balance Balance Assessed: Yes Static Sitting Balance Static Sitting - Balance Support: Feet supported Static Sitting - Level of Assistance: 7: Independent Dynamic Sitting Balance Dynamic Sitting - Balance Support: Feet supported Dynamic Sitting - Level of Assistance: 7: Independent Static  Standing Balance Static Standing - Balance Support: During functional activity;Bilateral upper extremity supported Static Standing - Level of Assistance: 6: Modified independent (Device/Increase time) Dynamic Standing Balance Dynamic Standing - Balance Support: During functional activity;Bilateral upper extremity supported Dynamic Standing - Level of Assistance: 5: Stand by assistance Extremity/Trunk Assessment RUE Assessment RUE Assessment: Within Functional Limits LUE Assessment LUE Assessment: Within Functional Limits   [name] 11/06/2024, 8:18 AM
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
## 2024-11-06 NOTE — Progress Notes (Signed)
 Dressing change done with wife. Wife aware to change dressing daily.

## 2024-11-06 NOTE — Progress Notes (Addendum)
 LVAD Coordinator Rounding Note:  Admitted 10/01/24 for optimization prior to LVAD implant. Impella 5.5 placed 10/06/24.  HM 3 LVAD implanted on 10/14/24 by Dr Lucas under destination therapy criteria.  12/18: Milrinone  titrated from 0.25 to 0.5 mcg/kg/min post RHC d/t low-output and pulmonary hypertension 12/19: NE added 12/22: Markedly elevated PA pressures, elevated PCWP and elevated SVR. Titrated off NE. Added DBA.  12/23: Impella 5.5 Implanted.  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 Heparin  > Bival.  12/31: HM III LVAD implant 1/5: LVAD speed increased to 5400  1/6: LUE weakness, head CT negative  1/9: Ramp echo, speed increased to 5500 rpm 1/11: Off milrinone . 1/12: bedside TEE/DCCV. EF 20%, mod/sev HK. VAD speed decreased to 5400 14: transferred out of CCU  1/15: repeat head CT negative for acute changes. 1/19: Discharged to CIR  Pt sitting up in the recliner after therapy this morning. Denies complaints. Reports no issues in managing his VAD equipment.   Pt seemed clear today during conversation. Oriented x 4.  Discharge education completed 10/28/24. Tentative plan for discharge Tuesday 11/10/24.  Home VAD equipment ordered 1/19.  Vital signs: Temp: 98.1 HR: 72 Doppler Pressure: 80 Automatic BP: 92/70 (84) O2 Sat: 99% on RA Wt: 235.4>244.3>243.4>234.8>223.5>239.6>220.2>224.2>214>210.9>214>200.8>200.6>201.6>201.6 lb    LVAD interrogation reveals:  Speed: 5400 Flow: 5.0 Power: 4.0 w PI: 3.1   Alarms: none Events: none Hematocrit: 33  Fixed speed: 5400 Low speed limit: 5100   Drive Line: Existing VAD dressing removed and site care performed using sterile technique by VAD coordinator. Drive line exit site cleaned with Chlora prep applicators x 2, allowed to dry, and gauze dressing with Silverlon patch applied. Exit site unincorporated, the velour is fully implanted at exit site. Moderate amount of serosanguinous drainage present on previous dressing. No redness, tenderness,  foul odor or rash noted Cath grip anchor replaced Continue MWF dressing changes by VAD coordinator or nurse champion. Next dressing change due 11/09/24 by VAD coordinator or nurse champion.    Labs:  LDH trend: 703>615>541>598>724>405>366>294>243>295>594>256>325>218  INR trend: 1.3>1.4>1.4>1.6>1.7>1.7>2.0>2.1>2.2>2.3>2.2>1.8>1.7>2.0>2.0>2.6  Anticoagulation Plan: - INR Goal: 2.0 - 2.5 - Coumadin  dosing per pharmacy  Blood Products:  Intra-op 10/14/24:   4 FFP  2 platelets  700 cellsaver  DDAVP  x 2 Post-op:  10/15/24: 1 PRBC  Device: - Biotronik -Therapies: ON 10/29/24  Arrythmias: cardioverted from afib on 10/26/24  Respiratory: extubated 10/15/24  Infection:   Drips:  Milrinone  0.125 mcg/kg/min-off Amiodarone  30 mg/hr-off  Adverse Events on VAD:  Patient Education:  Completed 10/28/24. See separate note for details Ongoing dressing change teaching with pt's wife [name]  Plan/Recommendations:  1. Page VAD coordinator for any equipment or drive line concerns 2. Continue Monday, Wednesday, Friday drive line dressing changes by VAD coordinator or nurse champion  Schuyler Lunger RN, BSN VAD Coordinator 24/7 Pager [phone]

## 2024-11-06 NOTE — Progress Notes (Signed)
 Occupational Therapy Session Note  Patient Details  Name: James [name] MRN: [id number] Date of Birth: 07-10-1961  Today's Date: 11/06/2024 OT Individual Time: 1034-1130 OT Individual Time Calculation (min): 56 min    Short Term Goals: Week 1:  OT Short Term Goal 1 (Week 1): STG= LTG d/t ELOS  Skilled Therapeutic Interventions/Progress Updates:    Pt received sitting up in the w/c with no c/o pain. His wife arrived a few minutes after session began. Family education session completed with pt and his wife. Verbal education provided re fall risk reduction, energy conservation strategies, home carryover of transfer training, ADLs, and IADLs. Demonstration completed on pt transfers at (S)- mod I level. Provided education and demonstration on DME use recommendations at home. Extensive discussion re LVAD management at home- OT bringing up potential power loss this weekend- need to call [employer] to ensure they are aware of power needed for medical equipment. Pt confident in his generator but his wife is more apprehensive. Following team discussion d/c will be pushed to Tuesday to account for weather and ensure safe d/c. Discussed pt's cognitive deficits as there continues to be intermittent times of confusion. Administered the MOCA and pt scored a 15/30. When reviewing the results he was very defensive re results, attempting to fix every mistake and provide reason it was missed. Gentle education and encouragement of acceptance for these cognitive deficits. His wife was observing entire session. He returned to his room via 100 ft of functional mobility with the rollator at (S) level. Pt was left sitting up in the recliner with all needs met and call bell within reach.   Therapy Documentation Precautions:  Precautions Precautions: Fall Precaution Booklet Issued: No Precaution/Restrictions Comments: LVAD Restrictions Weight Bearing Restrictions Per Provider Order: No RUE Weight Bearing Per  Provider Order: Non weight bearing LUE Weight Bearing Per Provider Order: Non weight bearing Other Position/Activity Restrictions: sternal precautions   Therapy/Group: Individual Therapy  [name] 11/06/2024, 12:16 PM

## 2024-11-06 NOTE — Therapy (Signed)
 Physical Therapy Session Note  Patient Details  Name: James [name] MRN: [id number] Date of Birth: 04-08-1961  Today's Date: 11/06/2024 PT Individual Time: 0932-1028 PT Individual Time Calculation (min): 56 min   Short Term Goals: Week 1:  PT Short Term Goal 1 (Week 1): STGs = LTGs  Skilled Therapeutic Interventions/Progress Updates:     Pt received seated in recliner and agrees to therapy. No complaint of pain but does mention fatigue and not wanting to do anything. Pt is redirectable and agrees to participate with gentle encouragement. Pt performs sit to stand and ambulatory transfer to [facility] with rollator and cues for hand placement and positioning. WC transport to gym. Pt completes ramp navigation and car transfer with rollator and cues for safety and sequencing.Seated rest break. Pt completes x12 6 steps with bilateral handrails and cues for step placement. Following extended seated rest break, pt ambulates x175' with rollator and cues for upright posture and increasing gait speed and stride length to improve balance. Pt let seated in recliner with all needs within reach.   Therapy Documentation Precautions:  Precautions Precautions: Fall Precaution Booklet Issued: No Precaution/Restrictions Comments: LVAD Restrictions Weight Bearing Restrictions Per Provider Order: No RUE Weight Bearing Per Provider Order: Non weight bearing LUE Weight Bearing Per Provider Order: Non weight bearing Other Position/Activity Restrictions: sternal precautions    Therapy/Group: Individual Therapy  [name], PT, DPT 11/06/2024, 3:42 PM

## 2024-11-06 NOTE — Progress Notes (Signed)
 Physical Therapy Discharge Summary  Patient Details  Name: James [name] MRN: [id number] Date of Birth: [birth year]  Date of Discharge from PT service:November 09, 2024  {CHL IP REHAB PT TIME CALCULATION:[id number]}   Patient has met 8 of 8 long term goals due to improved activity tolerance, improved balance, improved postural control, increased strength, and improved attention.  Patient to discharge at an ambulatory level Supervision.   Patient's care partner did not attend PT family education session, despite multiple being scheduled and communicated to her.   Reasons goals not met: NA  Recommendation:  Patient will benefit from ongoing skilled PT services in outpatient setting to continue to advance safe functional mobility, address ongoing impairments in balance, ambulation, endurance, and minimize fall risk.  Equipment: Recommended rollator  Reasons for discharge: treatment goals met and discharge from hospital  Patient/family agrees with progress made and goals achieved: Yes  Skilled Therapeutic Interventions:   PT Discharge Precautions/Restrictions Precautions Precautions: Fall Precaution Booklet Issued: No Precaution/Restrictions Comments: LVAD Restrictions Weight Bearing Restrictions Per Provider Order: No Other Position/Activity Restrictions: sternal precautions Pain Interference Pain Interference Pain Effect on Sleep: 1. Rarely or not at all Pain Interference with Therapy Activities: 1. Rarely or not at all Pain Interference with Day-to-Day Activities: 1. Rarely or not at all Vision/Perception  Vision - History Ability to See in Adequate Light: 0 Adequate Perception Perception: Within Functional Limits Praxis Praxis: WFL  Cognition Overall Cognitive Status: Within Functional Limits for tasks assessed Arousal/Alertness: Awake/alert Orientation Level: Oriented X4 Memory: Appears intact Awareness: Appears intact Problem Solving: Appears  intact Safety/Judgment: Appears intact Comments: Cognition varies and often pt will have intermittent moments of questionable confusion Sensation Sensation Light Touch: Appears Intact Coordination Gross Motor Movements are Fluid and Coordinated: Yes Fine Motor Movements are Fluid and Coordinated: Yes Motor  Motor Motor: Within Functional Limits  Mobility Bed Mobility Bed Mobility: Sit to Supine;Supine to Sit Supine to Sit: Supervision/Verbal cueing Sit to Supine: Supervision/Verbal cueing Transfers Transfers: Sit to Stand;Stand to Sit;Stand Pivot Transfers Sit to Stand: Supervision/Verbal cueing Stand to Sit: Supervision/Verbal cueing Stand Pivot Transfers: Supervision/Verbal cueing Transfer (Assistive device): Rolling walker Locomotion  Gait Ambulation: Yes Gait Assistance: Supervision/Verbal cueing Gait Distance (Feet): 175 Feet Assistive device: Rollator Gait Assistance Details: Verbal cues for gait pattern;Verbal cues for technique;Verbal cues for precautions/safety Gait Gait: Yes Gait Pattern: Impaired Gait Pattern: Decreased stride length Stairs / Additional Locomotion Stairs: Yes Stairs Assistance: Supervision/Verbal cueing Stair Management Technique: Two rails Number of Stairs: 12 Height of Stairs: 6 Ramp: Supervision/Verbal cueing Curb: Supervision/Verbal cueing Wheelchair Mobility Wheelchair Mobility: No  Trunk/Postural Assessment  Cervical Assessment Cervical Assessment: Within Functional Limits Thoracic Assessment Thoracic Assessment: Within Functional Limits Lumbar Assessment Lumbar Assessment: Within Functional Limits Postural Control Postural Control: Within Functional Limits  Balance Balance Balance Assessed: Yes Static Sitting Balance Static Sitting - Balance Support: Feet supported Static Sitting - Level of Assistance: 7: Independent Dynamic Sitting Balance Dynamic Sitting - Balance Support: Feet supported Dynamic Sitting - Level of  Assistance: 7: Independent Static Standing Balance Static Standing - Balance Support: During functional activity;Bilateral upper extremity supported Static Standing - Level of Assistance: 6: Modified independent (Device/Increase time) Dynamic Standing Balance Dynamic Standing - Balance Support: During functional activity;Bilateral upper extremity supported Dynamic Standing - Level of Assistance: 5: Stand by assistance Extremity Assessment  RUE Assessment RUE Assessment: Within Functional Limits LUE Assessment LUE Assessment: Within Functional Limits RLE Assessment General Strength Comments: Grossly 4/5, endurance impaired LLE Assessment General Strength Comments: Grossly 4/5, impaired  endurance   James [name] James [name] 11/06/2024, 10:17 AM

## 2024-11-06 NOTE — Progress Notes (Signed)
 Occupational Therapy Session Note  Patient Details  Name: James [name] MRN: [id number] Date of Birth: 1961/06/18  Today's Date: 11/06/2024 OT Individual Time: 9263-9154 OT Individual Time Calculation (min): 69 min    Short Term Goals: Week 1:  OT Short Term Goal 1 (Week 1): STG= LTG d/t ELOS  Skilled Therapeutic Interventions/Progress Updates:    Pt received supine with no c/o pain, agreeable to OT session. He reports high fatigue from therapies yesterday. He came to EOB with good awareness of driveline safety, and (S). He reported I cannot put these pants on OT provided encouragement for independence and he was then able to thread them with no assist. He attempted to stand and was unsuccessful and very discouraged. OT cued for coming to EOB d/t long femurs and once he did this and rollator was placed in front of him, he stood with (S). Using the rollator he pivoted to the w/c with (S). He completed grooming tasks and oral care seated with set up assist. BP assessed via dynamap- 110/82. Pt was taken via w/c to the therapy gym for time management. He completed gentle UB strengthening within sternal precaution adherence. He completed 3x15 bicep curls with a 4 lb dumbbell. Unweighted gentle reach into shoulder flexion with elbow bent, staying within the tube for 2x6 repetitions. He initially tried with a weight and reported pulling in his chest so removed weight immediately and provided edu on importance of self monitoring  sternal precaution adherence. He returned to his room following. He was reluctant but agreeable with encouragement to complete 5 ft of functional mobility to the recliner with (S). He was left sitting up with all needs met. Pt reclined back to provide pressure relief to his sacrum.   Therapy Documentation Precautions:  Precautions Precautions: Fall Precaution/Restrictions Comments: LVAD Restrictions Weight Bearing Restrictions Per Provider Order: Yes RUE Weight Bearing  Per Provider Order: Non weight bearing LUE Weight Bearing Per Provider Order: Non weight bearing Other Position/Activity Restrictions: sternal precautions   Therapy/Group: Individual Therapy  [name] 11/06/2024, 8:09 AM

## 2024-11-06 NOTE — Progress Notes (Signed)
 PHARMACY - ANTICOAGULATION CONSULT NOTE  Pharmacy Consult for warfarin Indication: LVAD HM3 + hx AFib  Allergies[1]  Patient Measurements: Height: 6' 6 (198.1 cm) Weight: 91.2 kg (201 lb 1 oz) IBW/kg (Calculated) : 91.4  Vital Signs: Temp: 98.3 F (36.8 C) (01/23 0548) Temp Source: Oral (01/23 0548) BP: 95/78 (01/23 0548) Pulse Rate: 61 (01/23 0548)  Labs: Recent Labs    11/04/24 0510 11/04/24 1000 11/05/24 0554 11/06/24 0644  HGB 11.0*  --  10.4* 10.9*  HCT 34.0*  --  31.8* 33.4*  PLT 204  --  196 212  LABPROT 25.5*  --  28.7* 29.6*  INR 2.2*  --  2.6* 2.7*  CREATININE  --  1.31* 1.25*  --     Estimated Creatinine Clearance: 78 mL/min (A) (by C-G formula based on SCr of 1.25 mg/dL (H)).   Medical History: Past Medical History:  Diagnosis Date   CKD (chronic kidney disease) stage 2, GFR 60-89 ml/min    COPD (chronic obstructive pulmonary disease) (HCC)    Diabetes mellitus type II, non insulin  dependent (HCC)    Dilated aortic root    Elevated PSA    Hypercholesteremia    Hypertension    NICM (nonischemic cardiomyopathy) (HCC)    NSVT (nonsustained ventricular tachycardia) (HCC)    Obstructive sleep apnea    Paroxysmal atrial fibrillation (HCC)    Pulmonary hypertension (HCC)    PVC's (premature ventricular contractions)    Tobacco abuse     Medications:  Scheduled:   amiodarone   200 mg Oral BID   amoxicillin -clavulanate  1 tablet Oral Q12H   atorvastatin   40 mg Oral Daily   Chlorhexidine  Gluconate Cloth  6 each Topical Daily   collagenase    Topical Daily   dapagliflozin  propanediol  5 mg Oral Daily   digoxin   0.0625 mg Oral Daily   gabapentin   200 mg Oral QHS   insulin  aspart  0-15 Units Subcutaneous TID WC   loratadine   10 mg Oral Daily   mexiletine  150 mg Oral Q12H   multivitamin with minerals  1 tablet Oral Daily   pantoprazole   40 mg Oral Daily   senna-docusate  2 tablet Oral QHS   sildenafil   20 mg Oral TID   sodium chloride  flush   10-40 mL Intracatheter Q12H   spironolactone   50 mg Oral Daily   Warfarin - Pharmacist Dosing Inpatient   Does not apply q1600   zinc  sulfate (50mg  elemental zinc )  220 mg Oral Q supper    Assessment: 63 yom who underwent LVAD HM3 placement on 12/31. Was on apixaban  PTA for hx Afib > now with LVAD plan to change to warfarin.   INR slightly above goal at 2.7. Hgb 10.9, plt 212. No s/sx of bleeding. Oral intake documented at 60% yesterday for 1 meal but has endorsed eating better.   Goal of Therapy:  INR 2-2.5 Monitor platelets by anticoagulation protocol: Yes   Plan:  Warfarin 3 mg x 1 tonight. Monitor daily INR, CBC, and for s/sx of bleeding   Thank you for allowing pharmacy to participate in this patient's care,  Harlene Barlow, [employer], [facility] Clinical Pharmacist  11/06/2024 10:01 AM   [facility] pharmacy phone numbers are listed on [email or web address]       [1] No Known Allergies

## 2024-11-06 NOTE — Progress Notes (Addendum)
 Patient ID: James [name], male   DOB: 16-Jul-1961, 64 y.o.   MRN: [id number]     Advanced Heart Failure Rounding Note   [facility] Cardiologist: Dr. Zenaida Chief Complaint: Post-op LVAD Patient Profile   James [name] is a 64 y.o. male with history of stage D cardiomyopathy/chronic systolic heart failure now end-stage w/ inotrope dependence, CKD IIIa, hx VT, PAF, prostate cancer, meningioma.   Admitted for LVAD implant/ pre-VAD HF optimization.   Significant events:   12/18: Milrinone  titrated from 0.25 to 0.5 mcg/kg/min post RHC d/t low-output and pulmonary hypertension 12/19: NE added 12/22: Markedly elevated PA pressures, elevated PCWP and elevated SVR. Titrated off NE. Added DBA.  12/23: Impella 5.5 Implanted.  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 Heparin  > Bival.  12/31: HM III LVAD implant 1/5: LVAD speed increased to 5400  1/6: LUE weakness, head CT negative  1/9: Ramp echo, speed increased to 5500 rpm 1/11: Off milrinone . 1/12: bedside TEE/DCCV. EF 20%, mod/sev HK. VAD speed decreased to 5400 1/14: transferred out of CCU  1/15: repeat head CT negative for acute changes   Subjective:    Doppler pressure 80-90s. INR 2.7  LVAD INTERROGATION:  HeartMate IIl LVAD: Flow 4.6 liters/min, speed 5400  power 3.8 PI 3.4. Parameters reviewed.  Objective:    Weight Range: 91.2 kg Body mass index is 23.23 kg/m.   Vital Signs:   Temp:  [98.2 F (36.8 C)-98.3 F (36.8 C)] 98.3 F (36.8 C) (01/23 0548) Pulse Rate:  [56-72] 61 (01/23 0548) Resp:  [17-18] 17 (01/23 0548) BP: (95-111)/(78-89) 95/78 (01/23 0548) SpO2:  [99 %-100 %] 100 % (01/23 0548) Weight:  [91.2 kg] 91.2 kg (01/23 0500) Last BM Date : 11/04/24  Doppler MAP: 80-90s  Weight change: Filed Weights   11/03/24 0600 11/05/24 0600 11/06/24 0500  Weight: 91 kg 91.2 kg 91.2 kg   Intake/Output:  Intake/Output Summary (Last 24 hours) at 11/06/2024 0954 Last data filed at 11/06/2024 0741 Gross per 24 hour  Intake 708 ml   Output 600 ml  Net 108 ml    Physical Exam   General: Well appearing. No distress on RA Cardiac: JVP flat. Mechanical heart sounds with LVAD hum present.  Driveline: Dressing C/D/I. No drainage or redness. Anchor in place. Extremities: Warm and dry. No edema. Neuro: Alert and oriented x3. Affect pleasant.   Labs   CBC Recent Labs    11/05/24 0554 11/06/24 0644  WBC 7.8 7.1  HGB 10.4* 10.9*  HCT 31.8* 33.4*  MCV 95.2 94.6  PLT 196 212   Basic Metabolic Panel Recent Labs    98/79/73 1038 11/04/24 1000 11/05/24 0554  NA  --  133* 132*  K  --  5.0 4.7  CL  --  99 101  CO2  --  22 23  GLUCOSE  --  158* 89  BUN  --  21 21  CREATININE  --  1.31* 1.25*  CALCIUM   --  8.8* 8.8*  MG 2.0  --   --    Liver Function Tests No results for input(s): AST, ALT, ALKPHOS, BILITOT, PROT, ALBUMIN  in the last 72 hours.  BNP (last 3 results) Recent Labs    06/10/24 1213 07/09/24 1649 07/23/24 0959  BNP 1,071.5* 3,316.0* 595.8*   ProBNP (last 3 results) Recent Labs    10/15/24 0400 10/21/24 0433 10/28/24 0559  PROBNP 9,508.0* 5,277.0* 8,170.0*   Medications:    Scheduled Medications:  amiodarone   200 mg Oral BID   amoxicillin -clavulanate  1  tablet Oral Q12H   atorvastatin   40 mg Oral Daily   Chlorhexidine  Gluconate Cloth  6 each Topical Daily   collagenase    Topical Daily   dapagliflozin  propanediol  5 mg Oral Daily   digoxin   0.0625 mg Oral Daily   gabapentin   200 mg Oral QHS   insulin  aspart  0-15 Units Subcutaneous TID WC   loratadine   10 mg Oral Daily   mexiletine  150 mg Oral Q12H   multivitamin with minerals  1 tablet Oral Daily   pantoprazole   40 mg Oral Daily   senna-docusate  2 tablet Oral QHS   sildenafil   20 mg Oral TID   sodium chloride  flush  10-40 mL Intracatheter Q12H   spironolactone   50 mg Oral Daily   Warfarin - Pharmacist Dosing Inpatient   Does not apply q1600   zinc  sulfate (50mg  elemental zinc )  220 mg Oral Q supper     Infusions:    PRN Medications: acetaminophen , alum & mag hydroxide-simeth, bisacodyl , diphenhydrAMINE , guaiFENesin -dextromethorphan , mouth rinse, oxyCODONE , phenol, prochlorperazine  **OR** prochlorperazine  **OR** prochlorperazine , senna, sodium phosphate  Assessment/Plan   Acute on chronic systolic CHF, NYHA IV: Nonischemic cardiomyopathy. RHC 12/18 showed elevated filling pressures with mod-severe mixed pulmonary arterial/pulmonary venous hypertension and low CO despite milrinone  0.25, increased to 0.5. Impella 5.5 placed 12/23 for further optimization. - S/p HM III LVAD implant by Dr. Lucas 10/14/24. - iNO off 1/1; Milrinone  off 1/11. Co-ox stable.  - Ramp echo 1/9, increased speed to 5500 rpm. TEE 1/12 decreased speed to 5400. - Volume ok. Stop lasix  with dizziness - Continue sildenafil  20 mg TID for RV support - Continue digoxin  0.0625 mg daily. Dig level 0.9 1/13. Repeat level tomorrow - Continue spironolactone  to 50 mg daily. No BMET today, check tomorrow - Doppler MAPs 80-90s - INR 2.7 Discussed warfarin dosing with PharmD personally.  PVCs/NSVT: Improved. Has Biotronik ICD. Tachy therapies turned on  - Continue mexiletine to 150 mg bid.   Persistent Atrial fibrillation:  - S/P DCCV 1/12. Remains in NSR - Continue amio 200 mg bid - on warfarin  Pulmonary hypertension: Severe combined pre/post capillary PH on RHC - resolved post VAD  CKD stage 3:  - Scr stable  Hypokalemia/ Hypomagnesemia  - resolved  HLD: continue atorvastatin  40 mg daily  Meningioma: Monitoring.   Prostate cancer: Treated with radiation.   Deconditioning: Per CIR team.  Patient is reasonably concerned about going home over the weekend [place] with LVAD. Will discuss with LVAD coordinators, as well as rehab team.   [name], NP 11/06/24, 9:54 AM  Advanced Heart Failure Team Pager [phone] (M-F; 7a - 5p)    Please visit  [email or web address]: For overnight coverage please call cardiology fellow first. If fellow not available call Shock/ECMO MD on call.  For ECMO / Mechanical Support (Impella, IABP, LVAD) issues call Shock / ECMO MD on call.   Patient seen and examined with the above-signed Advanced Practice Provider and/or Housestaff. I personally reviewed laboratory data, imaging studies and relevant notes. I independently examined the patient and formulated the important aspects of the plan. I have edited the note to reflect any of my changes or salient points. I have personally discussed the plan with the patient and/or family.  Diuretics stopped yesterday due to orthostasis. Feeling better. MAP 80-90  Working with CIR.   General:  NAD.  HEENT: normal  Neck: supple. JVP not elevated.  Carotids 2+ bilat;  no bruits. No lymphadenopathy or thryomegaly appreciated. Cor: LVAD hum.  Lungs: Clear. Abdomen: soft, nontender, non-distended. No hepatosplenomegaly. No bruits or masses. Good bowel sounds. Driveline site clean. Anchor in place.  Extremities: no cyanosis, clubbing, rash. Warm no edema  Neuro: alert & oriented x 3. No focal deficits. Moves all 4 without problem   Making steady progress. Orthostasis improved with holding diuretics.   VAD interrogated personally. Parameters stable.  INR 2.7 Discussed warfarin dosing with PharmD personally.  Likely home early next week. Given need for uninterrupted power supply for VAD probably best to keep in house until we make sure he will be safe at home without risk of losing power  Toribio Fuel, MD  12:57 PM

## 2024-11-06 NOTE — Progress Notes (Signed)
 Patient ID: James [name], male   DOB: 08/29/1961, 64 y.o.   MRN: [id number]  Pt will now d/c on 1/27 due to possible power outage due to pending bad weather.  Pt and wife are in agreement.   Graeme Jude, MSW, LCSW Office: [phone] Cell: [phone] Fax: [phone]

## 2024-11-06 NOTE — Progress Notes (Signed)
" °   11/06/24 1227  Wound 11/02/24 1719 Pressure Injury Sacrum Medial Deep Tissue Pressure Injury - Purple or maroon localized area of discolored intact skin or blood-filled blister due to damage of underlying soft tissue from pressure and/or shear.  Date First Assessed/Time First Assessed: 11/02/24 1719   Present on Original Admission: (c) Yes  Primary Wound Type: Pressure Injury  Location: Sacrum  Location Orientation: Medial  Staging: Deep Tissue Pressure Injury - Purple or maroon localized are...  Site / Wound Assessment Clean;Yellow;Pink  Peri-wound Assessment Pink;Intact  Drainage Description No odor  Drainage Amount Scant  Treatment Cleansed (xeroform, santyl , foam)  Dressing Type Foam - Lift dressing to assess site every shift  Dressing Changed Changed  Dressing Status Clean, Dry, Intact   Patient told this nurse that sacral dressing changes were not being completed, however according to documentation they have been,  "

## 2024-11-07 DIAGNOSIS — K30 Functional dyspepsia: Secondary | ICD-10-CM

## 2024-11-07 DIAGNOSIS — N183 Chronic kidney disease, stage 3 unspecified: Secondary | ICD-10-CM

## 2024-11-07 DIAGNOSIS — E1122 Type 2 diabetes mellitus with diabetic chronic kidney disease: Principal | ICD-10-CM

## 2024-11-07 DIAGNOSIS — I5022 Chronic systolic (congestive) heart failure: Secondary | ICD-10-CM

## 2024-11-07 DIAGNOSIS — Z794 Long term (current) use of insulin: Secondary | ICD-10-CM

## 2024-11-07 LAB — GLUCOSE, CAPILLARY
Glucose-Capillary: 114 mg/dL — ABNORMAL HIGH (ref 70–99)
Glucose-Capillary: 105 mg/dL — ABNORMAL HIGH (ref 70–99)
Glucose-Capillary: 123 mg/dL — ABNORMAL HIGH (ref 70–99)

## 2024-11-07 LAB — PROTIME-INR
INR: 2.7 — ABNORMAL HIGH (ref 0.8–1.2)
Prothrombin Time: 29.9 s — ABNORMAL HIGH (ref 11.4–15.2)

## 2024-11-07 LAB — DIGOXIN LEVEL: Digoxin Level: 0.6 ng/mL — ABNORMAL LOW (ref 0.8–2.0)

## 2024-11-07 LAB — BASIC METABOLIC PANEL WITH GFR
Anion gap: 8 (ref 5–15)
BUN: 27 mg/dL — ABNORMAL HIGH (ref 8–23)
CO2: 23 mmol/L (ref 22–32)
Calcium: 8.4 mg/dL — ABNORMAL LOW (ref 8.9–10.3)
Chloride: 101 mmol/L (ref 98–111)
Creatinine, Ser: 1.14 mg/dL (ref 0.61–1.24)
GFR, Estimated: >60 mL/min
Glucose, Bld: 138 mg/dL — ABNORMAL HIGH (ref 70–99)
Potassium: 4.7 mmol/L (ref 3.5–5.1)
Sodium: 133 mmol/L — ABNORMAL LOW (ref 135–145)

## 2024-11-07 LAB — CBC
HCT: 33.2 % — ABNORMAL LOW (ref 39.0–52.0)
Hemoglobin: 10.6 g/dL — ABNORMAL LOW (ref 13.0–17.0)
MCH: 30.6 pg (ref 26.0–34.0)
MCHC: 31.9 g/dL (ref 30.0–36.0)
MCV: 96 fL (ref 80.0–100.0)
Platelets: 192 10*3/uL (ref 150–400)
RBC: 3.46 MIL/uL — ABNORMAL LOW (ref 4.22–5.81)
RDW: 17.4 % — ABNORMAL HIGH (ref 11.5–15.5)
WBC: 7.7 10*3/uL (ref 4.0–10.5)
nRBC: 0 % (ref 0.0–0.2)

## 2024-11-07 MED ORDER — CALCIUM CARBONATE ANTACID 500 MG PO CHEW
400.0000 mg | CHEWABLE_TABLET | Freq: Three times a day (TID) | ORAL | Status: DC | PRN
  Administered 2024-11-07 – 2024-11-10 (×2): 400 mg via ORAL
  Filled 2024-11-07 (×2): qty 2

## 2024-11-07 MED ORDER — WARFARIN SODIUM 2 MG PO TABS
2.0000 mg | ORAL_TABLET | Freq: Once | ORAL | Status: AC
  Administered 2024-11-07: 2 mg via ORAL
  Filled 2024-11-07: qty 1

## 2024-11-07 NOTE — Progress Notes (Signed)
 Patient ID: James [name], male   DOB: 02/03/1961, 64 y.o.   MRN: [id number]     Advanced Heart Failure Rounding Note   [facility] Cardiologist: Dr. Zenaida Chief Complaint: Post-op LVAD Patient Profile   James [name] is a 64 y.o. male with history of stage D cardiomyopathy/chronic systolic heart failure now end-stage w/ inotrope dependence, CKD IIIa, hx VT, PAF, prostate cancer, meningioma.   Admitted for LVAD implant/ pre-VAD HF optimization.   Significant events:   12/18: Milrinone  titrated from 0.25 to 0.5 mcg/kg/min post RHC d/t low-output and pulmonary hypertension 12/19: NE added 12/22: Markedly elevated PA pressures, elevated PCWP and elevated SVR. Titrated off NE. Added DBA.  12/23: Impella 5.5 Implanted.  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 Heparin  > Bival.  12/31: HM III LVAD implant 1/5: LVAD speed increased to 5400  1/6: LUE weakness, head CT negative  1/9: Ramp echo, speed increased to 5500 rpm 1/11: Off milrinone . 1/12: bedside TEE/DCCV. EF 20%, mod/sev HK. VAD speed decreased to 5400 1/14: transferred out of CCU  1/15: repeat head CT negative for acute changes   Subjective:    Doppler pressure in the 80s, no complaints. Resting comfortably.   HeartMate 3 VAD Equipment Check Pump Speed (RPM): 5400 RPM Pump Flow (LPM): 4.5 Power (Watts): 4 Watts Pulsatility Index: 4.3 Fixed Speed Limit: 5400 rpm Low Speed Limit: 5100 rpm Alarms: No alarms Auscultated: Normal expected humming Power Module Self-Test (Daily): Done System Controller Self-Test: Passed Patient Battery Source: Battery pack Emergency Equipment at Bedside: Yes   Objective:    Weight Range: 96.8 kg Body mass index is 24.65 kg/m.   Vital Signs:   Temp:  [97.9 F (36.6 C)-98.4 F (36.9 C)] 98.4 F (36.9 C) (01/24 0502) Pulse Rate:  [66] 66 (01/24 0502) Resp:  [18] 18 (01/24 0502) BP: (96-119)/(80-101) 98/85 (01/24 1400) SpO2:  [100 %] 100 % (01/24 0502) Weight:  [96.8 kg] 96.8 kg (01/24  0502) Last BM Date : 11/04/24  Doppler MAP: 80-90s  Weight change: Filed Weights   11/05/24 0600 11/06/24 0500 11/07/24 0502  Weight: 91.2 kg 91.2 kg 96.8 kg   Intake/Output:  Intake/Output Summary (Last 24 hours) at 11/07/2024 1449 Last data filed at 11/07/2024 0748 Gross per 24 hour  Intake 420 ml  Output 1840 ml  Net -1420 ml    Physical Exam   General:  Well appearing. No resp difficulty Cor: Mechanical heart sounds with LVAD hum present. JVP flat, No edema Lungs: Normal WOB Abdomen: soft, nontender, nondistended.  Driveline: C/D/I; securement device intact and driveline incorporated Neuro: alert & orientedx3, cranial nerves grossly intact. moves all 4 extremities w/o difficulty.    Labs   CBC Recent Labs    11/06/24 0644 11/07/24 0732  WBC 7.1 7.7  HGB 10.9* 10.6*  HCT 33.4* 33.2*  MCV 94.6 96.0  PLT 212 192   Basic Metabolic Panel Recent Labs    98/77/73 0554 11/07/24 0732  NA 132* 133*  K 4.7 4.7  CL 101 101  CO2 23 23  GLUCOSE 89 138*  BUN 21 27*  CREATININE 1.25* 1.14  CALCIUM  8.8* 8.4*   Liver Function Tests No results for input(s): AST, ALT, ALKPHOS, BILITOT, PROT, ALBUMIN  in the last 72 hours.  BNP (last 3 results) Recent Labs    06/10/24 1213 07/09/24 1649 07/23/24 0959  BNP 1,071.5* 3,316.0* 595.8*   ProBNP (last 3 results) Recent Labs    10/15/24 0400 10/21/24 0433 10/28/24 0559  PROBNP 9,508.0* 5,277.0* 8,170.0*  Medications:    Scheduled Medications:  amiodarone   200 mg Oral BID   amoxicillin -clavulanate  1 tablet Oral Q12H   atorvastatin   40 mg Oral Daily   Chlorhexidine  Gluconate Cloth  6 each Topical Daily   collagenase    Topical Daily   dapagliflozin  propanediol  5 mg Oral Daily   digoxin   0.0625 mg Oral Daily   gabapentin   200 mg Oral QHS   insulin  aspart  0-15 Units Subcutaneous TID WC   loratadine   10 mg Oral Daily   mexiletine  150 mg Oral Q12H   multivitamin with minerals  1 tablet Oral Daily    pantoprazole   40 mg Oral Daily   senna-docusate  2 tablet Oral QHS   sildenafil   20 mg Oral TID   sodium chloride  flush  10-40 mL Intracatheter Q12H   spironolactone   50 mg Oral Daily   warfarin  2 mg Oral ONCE-1600   Warfarin - Pharmacist Dosing Inpatient   Does not apply q1600   zinc  sulfate (50mg  elemental zinc )  220 mg Oral Q supper    Infusions:    PRN Medications: acetaminophen , alum & mag hydroxide-simeth, bisacodyl , calcium  carbonate, diphenhydrAMINE , guaiFENesin -dextromethorphan , mouth rinse, oxyCODONE , phenol, prochlorperazine  **OR** prochlorperazine  **OR** prochlorperazine , senna, sodium phosphate  Assessment/Plan   Acute on chronic systolic CHF, NYHA IV: Nonischemic cardiomyopathy. RHC 12/18 showed elevated filling pressures with mod-severe mixed pulmonary arterial/pulmonary venous hypertension and low CO despite milrinone  0.25, increased to 0.5. Impella 5.5 placed 12/23 for further optimization. - S/p HM III LVAD implant by Dr. Lucas 10/14/24. - iNO off 1/1; Milrinone  off 1/11. Co-ox stable.  - Ramp echo 1/9, increased speed to 5500 rpm. TEE 1/12 decreased speed to 5400. - Volume ok. Stop lasix  with dizziness - Continue sildenafil  20 mg TID for RV support - Continue digoxin  0.0625 mg daily. Dig level 0.6 1/24 - Continue spironolactone  to 50 mg daily. No BMET today, check tomorrow - Doppler MAPs 80-90s - INR 2.7 Discussed warfarin dosing with PharmD personally.  PVCs/NSVT: Improved. Has Biotronik ICD. Tachy therapies turned on  - Continue mexiletine 150 mg bid.   Persistent Atrial fibrillation:  - S/P DCCV 1/12. Remains in NSR - Continue amio 200 mg bid - on warfarin  Pulmonary hypertension: Severe combined pre/post capillary PH on RHC - resolved post VAD  CKD stage 3:  - Scr stable  Hypokalemia/ Hypomagnesemia  - resolved  HLD: continue atorvastatin  40 mg daily  Meningioma: Monitoring.   Prostate cancer: Treated with radiation.   Deconditioning:  Per CIR team.  [name], MD 11/07/24, 2:49 PM  Advanced Heart Failure Team Pager [phone] (M-F; 7a - 5p)    Please visit [email or web address]: For overnight coverage please call cardiology fellow first. If fellow not available call Shock/ECMO MD on call.  For ECMO / Mechanical Support (Impella, IABP, LVAD) issues call Shock / ECMO MD on call.   Patient seen and examined with the above-signed Advanced Practice Provider and/or Housestaff. I personally reviewed laboratory data, imaging studies and relevant notes. I independently examined the patient and formulated the important aspects of the plan. I have edited the note to reflect any of my changes or salient points. I have personally discussed the plan with the patient and/or family.  Diuretics stopped yesterday due to orthostasis. Feeling better. MAP 80-90  Working with CIR.   General:  NAD.  HEENT: normal  Neck: supple. JVP not elevated.  Carotids 2+ bilat; no bruits. No lymphadenopathy or thryomegaly appreciated. Cor: LVAD hum.  Lungs:  Clear. Abdomen: soft, nontender, non-distended. No hepatosplenomegaly. No bruits or masses. Good bowel sounds. Driveline site clean. Anchor in place.  Extremities: no cyanosis, clubbing, rash. Warm no edema  Neuro: alert & oriented x 3. No focal deficits. Moves all 4 without problem   Making steady progress. Orthostasis improved with holding diuretics.   VAD interrogated personally. Parameters stable.  INR 2.7 Discussed warfarin dosing with PharmD personally.  Likely home early next week. Given need for uninterrupted power supply for VAD probably best to keep in house until we make sure he will be safe at home without risk of losing power  [name], MD  2:49 PM

## 2024-11-07 NOTE — Progress Notes (Signed)
" °   11/07/24 1900  Wound 11/02/24 1719 Pressure Injury Sacrum Medial Deep Tissue Pressure Injury - Purple or maroon localized area of discolored intact skin or blood-filled blister due to damage of underlying soft tissue from pressure and/or shear.  Date First Assessed/Time First Assessed: 11/02/24 1719   Present on Original Admission: (c) Yes  Primary Wound Type: Pressure Injury  Location: Sacrum  Location Orientation: Medial  Staging: Deep Tissue Pressure Injury - Purple or maroon localized are...  Site / Wound Assessment Clean;Dry;Pink  Peri-wound Assessment Intact  Drainage Description No odor  Drainage Amount Scant  Treatment Cleansed (saline wipe oitment and xeroform, and foam)  Dressing Type Foam - Lift dressing to assess site every shift  Dressing Changed Changed  Dressing Status Clean, Dry, Intact  Margins Unattached edges (unapproximated)   Woundcare complete, patient offloading with pillow below sacrum sitting at 35 degrees,  "

## 2024-11-07 NOTE — Progress Notes (Signed)
 PHARMACY - ANTICOAGULATION CONSULT NOTE  Pharmacy Consult for warfarin Indication: LVAD HM3 + hx AFib  Allergies[1]  Patient Measurements: Height: 6' 6 (198.1 cm) Weight: 96.8 kg (213 lb 4.8 oz) IBW/kg (Calculated) : 91.4  Vital Signs: Temp: 98.4 F (36.9 C) (01/24 0502) Temp Source: Oral (01/24 0502) BP: 99/80 (01/24 0600) Pulse Rate: 66 (01/24 0502)  Labs: Recent Labs    11/04/24 1000 11/05/24 0554 11/05/24 0554 11/06/24 0644 11/07/24 0732  HGB  --  10.4*   < > 10.9* 10.6*  HCT  --  31.8*  --  33.4* 33.2*  PLT  --  196  --  212 192  LABPROT  --  28.7*  --  29.6* 29.9*  INR  --  2.6*  --  2.7* 2.7*  CREATININE 1.31* 1.25*  --   --  1.14   < > = values in this interval not displayed.    Estimated Creatinine Clearance: 85.7 mL/min (by C-G formula based on SCr of 1.14 mg/dL).   Medical History: Past Medical History:  Diagnosis Date   CKD (chronic kidney disease) stage 2, GFR 60-89 ml/min    COPD (chronic obstructive pulmonary disease) (HCC)    Diabetes mellitus type II, non insulin  dependent (HCC)    Dilated aortic root    Elevated PSA    Hypercholesteremia    Hypertension    NICM (nonischemic cardiomyopathy) (HCC)    NSVT (nonsustained ventricular tachycardia) (HCC)    Obstructive sleep apnea    Paroxysmal atrial fibrillation (HCC)    Pulmonary hypertension (HCC)    PVC's (premature ventricular contractions)    Tobacco abuse     Medications:  Scheduled:   amiodarone   200 mg Oral BID   amoxicillin -clavulanate  1 tablet Oral Q12H   atorvastatin   40 mg Oral Daily   Chlorhexidine  Gluconate Cloth  6 each Topical Daily   collagenase    Topical Daily   dapagliflozin  propanediol  5 mg Oral Daily   digoxin   0.0625 mg Oral Daily   gabapentin   200 mg Oral QHS   insulin  aspart  0-15 Units Subcutaneous TID WC   loratadine   10 mg Oral Daily   mexiletine  150 mg Oral Q12H   multivitamin with minerals  1 tablet Oral Daily   pantoprazole   40 mg Oral Daily    senna-docusate  2 tablet Oral QHS   sildenafil   20 mg Oral TID   sodium chloride  flush  10-40 mL Intracatheter Q12H   spironolactone   50 mg Oral Daily   Warfarin - Pharmacist Dosing Inpatient   Does not apply q1600   zinc  sulfate (50mg  elemental zinc )  220 mg Oral Q supper    Assessment: 63 yom who underwent LVAD HM3 placement on 12/31. Was on apixaban  PTA for hx Afib > now with LVAD plan to change to warfarin.   INR slightly above goal today at 2.7 day 2. Will reduce today's dose slightly.   Goal of Therapy:  INR 2-2.5 Monitor platelets by anticoagulation protocol: Yes   Plan:  Warfarin 2 mg x 1 tonight. Monitor daily INR, CBC, and for s/sx of bleeding   Thank you for allowing pharmacy to participate in this patient's care,  Larraine Brazier, PharmD Clinical Pharmacist 11/07/2024  9:56 AM **Pharmacist phone directory can now be found on [email or web address] ([email or web address]).  Listed under [facility].         [1] No Known Allergies

## 2024-11-07 NOTE — Progress Notes (Signed)
 "                                                        PROGRESS NOTE   Subjective/Complaints: Reports some indigestion after drinking 4 glasses of milk.  Reports this usually happens after he drinks milk but he enjoys drinking this.  Indigestion is improving but he would like to try some Tums.    ROS: Denies fevers, chills, N/V, abdominal pain, constipation, diarrhea, SOB, cough, chest pain, new weakness or paraesthesias.   + indigestion  Objective:   No results found. Recent Labs    11/06/24 0644 11/07/24 0732  WBC 7.1 7.7  HGB 10.9* 10.6*  HCT 33.4* 33.2*  PLT 212 192   Recent Labs    11/05/24 0554 11/07/24 0732  NA 132* 133*  K 4.7 4.7  CL 101 101  CO2 23 23  GLUCOSE 89 138*  BUN 21 27*  CREATININE 1.25* 1.14  CALCIUM  8.8* 8.4*    Intake/Output Summary (Last 24 hours) at 11/07/2024 1113 Last data filed at 11/07/2024 0615 Gross per 24 hour  Intake 476 ml  Output 1840 ml  Net -1364 ml     Wound 11/02/24 1719 Pressure Injury Sacrum Medial Deep Tissue Pressure Injury - Purple or maroon localized area of discolored intact skin or blood-filled blister due to damage of underlying soft tissue from pressure and/or shear. (Active)    Physical Exam: Vital Signs Blood pressure 99/80, pulse 66, temperature 98.4 F (36.9 C), temperature source Oral, resp. rate 18, height 6' 6 (1.981 m), weight 96.8 kg, SpO2 100%. Constitutional: No apparent distress. Appropriate appearance for age.  Sitting in bedside chair appears comfortable.  HENT: No JVD. Neck Supple. Trachea midline. Atraumatic, normocephalic.  Eyes: PERRLA. EOMI. Visual fields grossly intact.  Cardiovascular: Audible electronic thrum  No Edema.    Respiratory: CTAB. No rales, rhonchi, or wheezing. On RA.  Abdomen: + bowel sounds, normoactive. No distention or tenderness.  Skin: Driveline site clean/dry/intact.  Sacral wound; unable to examine due to closing, dressing- NOT EXAMINED TODAY   MSK:      No  apparent deformity.      Strength:                RUE: 5/5 SA, 5/5 EF, 5/5 EE, 5/5 WE, 5/5 FF, 5/5 FA                 LUE: 5/5 SA, 5/5 EF, 5/5 EE, 5/5 WE, 5/5 FF, 5/5 FA                 RLE: 5/5 HF, 5/5 KE, 5/5 DF, 5/5 EHL, 5/5 PF                 LLE:  5/5 HF, 5/5 KE, 5/5 DF, 5/5 EHL, 5/5 PF   Neurologic exam:  Cognition: AAO to person, place, time and event.  + Significant processing, cognitive deficits. + Impulsivity with therapies Language: Fluent, No substitutions or neoglisms. No dysarthria.  Insight: Fair insight into current condition.  Mood: Pleasant affect, appropriate mood.  Sensation: Equal and intact in BL UE and Les.  Reflexes: 2+ in BL UE and LEs. Negative Hoffman's and babinski signs bilaterally.  CN: 2-12 grossly intact.  Coordination: No apparent tremors. No ataxia on FTN, HTS bilaterally. Spasticity: MAS 0 in  all extremities.   Physical exam unchanged from the above on reexamination 11/07/24      Assessment/Plan: 1. Functional deficits which require 3+ hours per day of interdisciplinary therapy in a comprehensive inpatient rehab setting. Physiatrist is providing close team supervision and 24 hour management of active medical problems listed below. Physiatrist and rehab team continue to assess barriers to discharge/monitor patient progress toward functional and medical goals  Care Tool:  Bathing    Body parts bathed by patient: Right arm, Left arm, Chest, Abdomen, Buttocks, Right upper leg, Left upper leg, Right lower leg, Front perineal area, Face, Left lower leg         Bathing assist Assist Level: Supervision/Verbal cueing     Upper Body Dressing/Undressing Upper body dressing   What is the patient wearing?: Pull over shirt    Upper body assist Assist Level: Independent    Lower Body Dressing/Undressing Lower body dressing      What is the patient wearing?: Pants     Lower body assist Assist for lower body dressing: Supervision/Verbal cueing      Toileting Toileting    Toileting assist Assist for toileting: Supervision/Verbal cueing     Transfers Chair/bed transfer  Transfers assist     Chair/bed transfer assist level: Supervision/Verbal cueing     Locomotion Ambulation   Ambulation assist      Assist level: Minimal Assistance - Patient > 75% Assistive device: No Device Max distance: 65'   Walk 10 feet activity   Assist     Assist level: Minimal Assistance - Patient > 75% Assistive device: No Device   Walk 50 feet activity   Assist    Assist level: Minimal Assistance - Patient > 75% Assistive device: No Device    Walk 150 feet activity   Assist Walk 150 feet activity did not occur: Safety/medical concerns         Walk 10 feet on uneven surface  activity   Assist     Assist level: Minimal Assistance - Patient > 75%     Wheelchair     Assist Is the patient using a wheelchair?: Yes Type of Wheelchair: Manual    Wheelchair assist level: Dependent - Patient 0% Max wheelchair distance: 150'    Wheelchair 50 feet with 2 turns activity    Assist        Assist Level: Dependent - Patient 0%   Wheelchair 150 feet activity     Assist      Assist Level: Dependent - Patient 0%   Blood pressure 99/80, pulse 66, temperature 98.4 F (36.9 C), temperature source Oral, resp. rate 18, height 6' 6 (1.981 m), weight 96.8 kg, SpO2 100%.  Medical Problem List and Plan: 1. Functional deficits secondary to debility related CHF/ LVAD placement             -patient may  shower             -ELOS/Goals: 5-7 days, mod I--discharge 1-27  - Stable to continue inpatient rehab  2.  Antithrombotics: -DVT/anticoagulation:  Mechanical:  Antiembolism stockings, knee (TED hose) Bilateral lower extremities              -antiplatelet therapy: Was on apixaban  PTA for hx Afib > now with LVAD on warfarin  INR Goal: 2.0 - 2.5  coumadin  dosing per pharmacy.   3. Pain Management: Gabapentin   200 mg at bedtime. PRN: tylenol  and oxycodone    - 1-20: Pain well-controlled on current regimen.  4. Mood/Behavior/Sleep:  LCSW to follow for evaluation and support when available.              -antipsychotic agents: n/a  5. Neuropsych/cognition: This patient is capable of making decisions on his own behalf. 6. Skin/Wound Care: Routine pressure relief measures. Continue sternal precautions.  Drive Line: Continue MWF dressing changes by VAD probation officer. Next dressing change due 11/04/24 by VAD coordinator or nurse champion. -Sacral wound: Continue Santyl  oint apply to yellow portion of sacral wound daily. Pressure relief, encourage appropriate nutrition - 1-20: Has had abdominal sutures since 12-31.  Will DC --ordered 1/21  - 1/21: Switched from LAL to gel mattress with frequent rolling for patient comfort--improved -1-23: Nursing staff provided education to wife on sacral dressing changes today.  LVAD team provided education on driveline changes.   7. Fluids/Electrolytes/Nutrition: Monitor strict I&O and daily weight.              -GERD: Protonix               -Ensure and vitamin supplements    8. Acute on chronic systolic CHF, NYHA IV: S/p HM III LVAD implant by Dr. Lucas 10/14/24. Advanced HF team following.              - Continue Lasix  40 mg daily with potassium 40 mEq, Sildenafil  20 mg TID for RV support. Digoxin  0.0625 mg daily. Dig level 0.9 1/13. Spironolactone  50 mg bid. Warfarin per pharmacy dosing.              -daily weights  - LVAD parameters stable, no d/d volume overload, appreciate cardiology recommendations  -1/24 no signs of volume overload noted, suspect weights are not accurate Filed Weights   11/05/24 0600 11/06/24 0500 11/07/24 0502  Weight: 91.2 kg 91.2 kg 96.8 kg    9. Pulmonary hypertension: Severe combined pre/post capillary PH on RHC-resolved    10. AKI on CKD stage 3: Scr stable, follow BMP in AM.    - 1-20: Mild increase in creatinine,  encourage p.o. fluids.  Continue to monitor--improving 1-22--repeat 1-23  -1/24 BUN a little higher at 27, creatinine low at 1.14, continue encourage p.o. fluids  11. PVCs/NSVT: Improved. Has Biotronik ICD.              -Continue mexiletine 150 mg bid.    12. Persistent Atrial fibrillation: s/p DCCV 1/12, currently NSR.              -Continue amiodarone  200 mg bid and warfarin.    13. ABLA: Hgb 10.7, Monitor daily INR, CBC, and for s/sx of bleeding.    - Hemoglobin stable 11  14. Hypokalemia/Hypomagnesemia: On potassium 40 mEq, continue supplementation PRN.   - 1-20: Magnesium  2.0, potassium 4.6 on admission labs.  Monitor.  15. T2DM: A1c 6.0-- Holding home Metformin and Farxiga . Insulin  reqs significantly decreased since PO intake.  -Continue monitoring glucose ac/hs with SSI as needed.    - Resume oral medications as appropriate  -1/22: Creatinine improving.  P.o. intakes remain variable, 10 to 100%.  Resume Farxiga  5 mg daily for cardiac indications, monitor--BMP ordered 1-24 for monitoring- overall stable  Recent Labs    11/06/24 2108 11/07/24 0613 11/07/24 1107  GLUCAP 133* 114* 105*     16. Constipation: Small recorded on 1/19, pt still endrosing constipation.              -Miralax  now BID, Senokot-S at bedtime.  -give 60 cc sorbitol  tonight.  -PRN senokot and sorbitol .  -  LBM 1/23, continue to monitor   17. Prostate cancer: s/p radiation.    18. Meningioma: Monitoring.    19.  Left ear pain.     - 1/20: Start Oxifloxacin 5 drops to the left ear daily for 7 days  - 1/22: Now seems some bulging behind tympanic membrane.  DC oxaflozane since, start Augmentin  875 every 12 hours for 5 days, add Claritin  10 mg daily for 5 days to clear sinuses and allow drainage 1-23: Symptoms resolved; continue antibiotics and Claritin  for 5 days total  20.  Indigestion  - Patient would like Tums instead of Mylanta, will order    LOS: 5 days A FACE TO FACE EVALUATION WAS  PERFORMED  Murray Collier 11/07/2024, 11:13 AM     "

## 2024-11-07 NOTE — Progress Notes (Signed)
 Occupational Therapy Session Note  Patient Details  Name: James [name] MRN: [id number] Date of Birth: [birth year]  Today's Date: 11/07/2024   OT Individual Time: 1006-1049 ( session 1)  OT Individual Time Calculation (min): 43 min   OT Individual Time: 8695-8661 session 2 OT Individual Time Calculation (min): 34 min  41 mins missed d/t fatigue and ear pain    Short Term Goals: Week 1:  OT Short Term Goal 1 (Week 1): STG= LTG d/t ELOS  Skilled Therapeutic Interventions/Progress Updates:  Session 1: Pt greeted seated in recliner, pt agreeable to OT intervention.   No pain.    Transfers/bed mobility/functional mobility:  Pt completed functional ambulation greater than a household distance with rollator with supervision.  Discussed sternal precautions in relation to sit>stands as pt uses BUE strength, educated pt on recommendation to complete sit>stands with no UE support, pt declined attempting other strategies for sit>stands.   Therapeutic activity:  Pt completed short distance functional ambulation task with pt instructed to ambulate ~ 8 ft to transport horseshoes from one place to another to simulate IADL tasks. Pt able to complete 5 laps of 8 ft with rollator and supervision.    Education:  Discussed options on amaz[employer] for Lvad clothes, pt prefers carry conceal shirt options, shared several option on [employer] for pt to discuss with his wife.     Exercises: pt completed short circuit endurance exercises as warm up as indicated below to facilitate improved global endurance:  1 min of Sit>stands Pt needed > 30 sec rest break 1 min of Standing marches Pt needed > 30 sec rest break                 Ended session with pt seated in recliner with all needs within reach and pt hooked up to wall power.      Session 2: Pt greeted in recliner, pt agreeable to OT intervention.    Pt transferred from wall power to batteries with set- up assist.   Transfers/bed mobility/functional  mobility: pt needed MIN A to rise from recliner. pt completed ~ 8 ft of functional ambulation from recliner> w/c with rollator and supervision.  Pt requested to be transported to gym in w/c d/t fatigue.    Exercises:  Pt completed 5 mins on kinetron for LB strengthening to facilitate improved global endurance for ADL participation. Pt c/o pain in ear during task, retrieved nurse who reached out to doctor for pain meds. After completing 5 mins pt reports I'm done. Requesting to return to room.                 Ended session with pt seated in recliner with all needs within reach.   Therapy Documentation Precautions:  Precautions Precautions: Fall Precaution Booklet Issued: No Precaution/Restrictions Comments: LVAD Restrictions Weight Bearing Restrictions Per Provider Order: Yes RUE Weight Bearing Per Provider Order: Non weight bearing LUE Weight Bearing Per Provider Order: Non weight bearing Other Position/Activity Restrictions: sternal precautions    Therapy/Group: Individual Therapy  Ronal Mallie Needy 11/07/2024, 1:42 PM

## 2024-11-07 NOTE — Progress Notes (Signed)
 Physical Therapy Session Note  Patient Details  Name: Dyon Rotert MRN: [id number] Date of Birth: [birth year]  Today's Date: 11/07/2024 PT Individual Time: 0800-0915 PT Individual Time Calculation (min): 75 min   Short Term Goals: Week 1:  PT Short Term Goal 1 (Week 1): STGs = LTGs  Skilled Therapeutic Interventions/Progress Updates:    Pt recd sitting EOB, No complaint of pain. Pt assisted with getting dressed with set up a, changing LVAD to batteries with VC for sequencing. Pt refused performing ADLs at sink in standing, insisting on performing at w/c level.   Pt encouraged to propel w/c with BLE for endurance vs transporting to gym, but was resistant and took frequent breaks. Pt refused therapist encouragement to walk to gym. Pt participated in obstacle course around day room with cone taps 2 x 8 each bout for dynamic balance and functional gait including turns and taking steps backward. Pt expressed concern about falling, however no LOB or knee buckling noted.  At this time, pt reports indigestion and chest pain, requesting to return to room. Medical team made aware. Pt returned to room and to recliner. Set up with legs elevated after reporting some mild light headedness. Nsg present in room at time of therapist exit to continue with meds pass.   Therapy Documentation Precautions:  Precautions Precautions: Fall Precaution Booklet Issued: No Precaution/Restrictions Comments: LVAD Restrictions Weight Bearing Restrictions Per Provider Order: Yes RUE Weight Bearing Per Provider Order: Non weight bearing LUE Weight Bearing Per Provider Order: Non weight bearing Other Position/Activity Restrictions: sternal precautions General:       Therapy/Group: Individual Therapy  Schuyler JAYSON Batter 11/07/2024, 8:48 AM

## 2024-11-08 DIAGNOSIS — K5900 Constipation, unspecified: Secondary | ICD-10-CM

## 2024-11-08 DIAGNOSIS — I5023 Acute on chronic systolic (congestive) heart failure: Secondary | ICD-10-CM

## 2024-11-08 DIAGNOSIS — R1013 Epigastric pain: Secondary | ICD-10-CM

## 2024-11-08 DIAGNOSIS — E119 Type 2 diabetes mellitus without complications: Secondary | ICD-10-CM

## 2024-11-08 DIAGNOSIS — H9209 Otalgia, unspecified ear: Secondary | ICD-10-CM

## 2024-11-08 LAB — GLUCOSE, CAPILLARY
Glucose-Capillary: 89 mg/dL (ref 70–99)
Glucose-Capillary: 178 mg/dL — ABNORMAL HIGH (ref 70–99)
Glucose-Capillary: 131 mg/dL — ABNORMAL HIGH (ref 70–99)
Glucose-Capillary: 119 mg/dL — ABNORMAL HIGH (ref 70–99)

## 2024-11-08 LAB — PROTIME-INR
INR: 2.2 — ABNORMAL HIGH (ref 0.8–1.2)
Prothrombin Time: 25.7 s — ABNORMAL HIGH (ref 11.4–15.2)

## 2024-11-08 LAB — BASIC METABOLIC PANEL WITH GFR
Anion gap: 9 (ref 5–15)
BUN: 26 mg/dL — ABNORMAL HIGH (ref 8–23)
CO2: 27 mmol/L (ref 22–32)
Calcium: 8.8 mg/dL — ABNORMAL LOW (ref 8.9–10.3)
Chloride: 100 mmol/L (ref 98–111)
Creatinine, Ser: 1.2 mg/dL (ref 0.61–1.24)
GFR, Estimated: >60 mL/min
Glucose, Bld: 95 mg/dL (ref 70–99)
Potassium: 4.1 mmol/L (ref 3.5–5.1)
Sodium: 136 mmol/L (ref 135–145)

## 2024-11-08 MED ORDER — POLYETHYLENE GLYCOL 3350 17 G PO PACK
17.0000 g | PACK | Freq: Two times a day (BID) | ORAL | Status: DC
  Administered 2024-11-08 – 2024-11-09 (×4): 17 g via ORAL
  Filled 2024-11-08 (×5): qty 1

## 2024-11-08 MED ORDER — LIDOCAINE HCL URETHRAL/MUCOSAL 2 % EX GEL: 1.0000 | CUTANEOUS | Status: DC | PRN

## 2024-11-08 MED ORDER — AMIODARONE HCL 200 MG PO TABS
200.0000 mg | ORAL_TABLET | Freq: Every day | ORAL | Status: DC
  Administered 2024-11-09 – 2024-11-10 (×2): 200 mg via ORAL
  Filled 2024-11-08 (×2): qty 1

## 2024-11-08 MED ORDER — WARFARIN SODIUM 3 MG PO TABS
3.0000 mg | ORAL_TABLET | Freq: Once | ORAL | Status: AC
  Administered 2024-11-08: 3 mg via ORAL
  Filled 2024-11-08: qty 1

## 2024-11-08 NOTE — Plan of Care (Signed)
" °  Problem: Consults Goal: [facility] GENERAL PATIENT EDUCATION Description: See Patient Education module for education specifics. Outcome: Adequate for Discharge Note: Understands how to work device and test it, and what to watch out for, and the requirments  of the device to function properly   "

## 2024-11-08 NOTE — Progress Notes (Signed)
 PHARMACY - ANTICOAGULATION CONSULT NOTE  Pharmacy Consult for warfarin Indication: LVAD HM3 + hx AFib  Allergies[1]  Patient Measurements: Height: 6' 6 (198.1 cm) Weight: 95.8 kg (211 lb 3.2 oz) IBW/kg (Calculated) : 91.4  Vital Signs: Temp: 98.2 F (36.8 C) (01/25 0349) Temp Source: Oral (01/25 0349) BP: 95/71 (01/25 0349) Pulse Rate: 95 (01/25 0349)  Labs: Recent Labs    11/06/24 0644 11/07/24 0732 11/08/24 0707  HGB 10.9* 10.6*  --   HCT 33.4* 33.2*  --   PLT 212 192  --   LABPROT 29.6* 29.9* 25.7*  INR 2.7* 2.7* 2.2*  CREATININE  --  1.14  --     Estimated Creatinine Clearance: 85.7 mL/min (by C-G formula based on SCr of 1.14 mg/dL).   Medical History: Past Medical History:  Diagnosis Date   CKD (chronic kidney disease) stage 2, GFR 60-89 ml/min    COPD (chronic obstructive pulmonary disease) (HCC)    Diabetes mellitus type II, non insulin  dependent (HCC)    Dilated aortic root    Elevated PSA    Hypercholesteremia    Hypertension    NICM (nonischemic cardiomyopathy) (HCC)    NSVT (nonsustained ventricular tachycardia) (HCC)    Obstructive sleep apnea    Paroxysmal atrial fibrillation (HCC)    Pulmonary hypertension (HCC)    PVC's (premature ventricular contractions)    Tobacco abuse     Medications:  Scheduled:   amiodarone   200 mg Oral BID   amoxicillin -clavulanate  1 tablet Oral Q12H   atorvastatin   40 mg Oral Daily   Chlorhexidine  Gluconate Cloth  6 each Topical Daily   collagenase    Topical Daily   dapagliflozin  propanediol  5 mg Oral Daily   digoxin   0.0625 mg Oral Daily   gabapentin   200 mg Oral QHS   insulin  aspart  0-15 Units Subcutaneous TID WC   loratadine   10 mg Oral Daily   mexiletine  150 mg Oral Q12H   multivitamin with minerals  1 tablet Oral Daily   pantoprazole   40 mg Oral Daily   senna-docusate  2 tablet Oral QHS   sildenafil   20 mg Oral TID   sodium chloride  flush  10-40 mL Intracatheter Q12H   spironolactone   50 mg  Oral Daily   Warfarin - Pharmacist Dosing Inpatient   Does not apply q1600   zinc  sulfate (50mg  elemental zinc )  220 mg Oral Q supper    Assessment: 63 yom who underwent LVAD HM3 placement on 12/31. Was on apixaban  PTA for hx Afib > now with LVAD plan to change to warfarin.   INR at goal today at 2.2. no CBC today but will get one tomorrow.  Goal of Therapy:  INR 2-2.5 Monitor platelets by anticoagulation protocol: Yes   Plan:  Warfarin 3 mg x 1 tonight. Monitor daily INR, CBC, and for s/sx of bleeding   Thank you for allowing pharmacy to participate in this patient's care,  Larraine Brazier, PharmD Clinical Pharmacist 11/08/2024  7:58 AM **Pharmacist phone directory can now be found on [email or web address] ([email or web address]).  Listed under [facility].          [1] No Known Allergies

## 2024-11-08 NOTE — Progress Notes (Signed)
 "                                                        PROGRESS NOTE   Subjective/Complaints: No new complaints or concerns today.  Ear pain is resolved today.  Indigestion has resolved.  Reports he was constipated this morning bowel movement this resolved after a BM today.    ROS: Denies fevers, chills, N/V, abdominal pain, constipation, diarrhea, SOB, cough, chest pain, new weakness or paraesthesias.   + indigestion +constipation- improved after BM   Objective:   No results found. Recent Labs    11/06/24 0644 11/07/24 0732  WBC 7.1 7.7  HGB 10.9* 10.6*  HCT 33.4* 33.2*  PLT 212 192   Recent Labs    11/07/24 0732 11/08/24 0707  NA 133* 136  K 4.7 4.1  CL 101 100  CO2 23 27  GLUCOSE 138* 95  BUN 27* 26*  CREATININE 1.14 1.20  CALCIUM  8.4* 8.8*    Intake/Output Summary (Last 24 hours) at 11/08/2024 1517 Last data filed at 11/08/2024 1351 Gross per 24 hour  Intake 240 ml  Output 1825 ml  Net -1585 ml     Wound 11/02/24 1719 Pressure Injury Sacrum Medial Deep Tissue Pressure Injury - Purple or maroon localized area of discolored intact skin or blood-filled blister due to damage of underlying soft tissue from pressure and/or shear. (Active)    Physical Exam: Vital Signs Blood pressure 101/81, pulse 81, temperature 98.8 F (37.1 C), resp. rate 18, height 6' 6 (1.981 m), weight 95.8 kg, SpO2 98%. Constitutional: No apparent distress. Appropriate appearance for age.  Laying in bed appears comfortable.  Mood appears improved today-smiling- less flat.  HENT: No JVD. Neck Supple. Trachea midline. Atraumatic, normocephalic.  Eyes: PERRLA. EOMI. Visual fields grossly intact.  Cardiovascular: Audible electronic thrum  No Edema.    Respiratory: CTAB. No rales, rhonchi, or wheezing. On RA.  Abdomen: + bowel sounds, normoactive. No distention or tenderness.  Skin: Driveline site clean/dry/intact.  Sacral wound; unable to examine due to closing, dressing- NOT EXAMINED  TODAY   MSK:      No apparent deformity.      Strength:                RUE: 5/5 SA, 5/5 EF, 5/5 EE, 5/5 WE, 5/5 FF, 5/5 FA                 LUE: 5/5 SA, 5/5 EF, 5/5 EE, 5/5 WE, 5/5 FF, 5/5 FA                 RLE: 5/5 HF, 5/5 KE, 5/5 DF, 5/5 EHL, 5/5 PF                 LLE:  5/5 HF, 5/5 KE, 5/5 DF, 5/5 EHL, 5/5 PF   Neurologic exam:  Cognition: AAO to person, place, time and event.  + Significant processing, cognitive deficits. + Impulsivity with therapies Language: Fluent, No substitutions or neoglisms. No dysarthria.  Insight: Fair insight into current condition.  Sensation: Equal and intact in BL UE and Les.  Reflexes: 2+ in BL UE and LEs. Negative Hoffman's and babinski signs bilaterally.  CN: 2-12 grossly intact.  Coordination: No apparent tremors. No ataxia on FTN, HTS bilaterally. Spasticity: MAS 0 in all extremities.  Physical exam unchanged from the above on reexamination 11/08/24      Assessment/Plan: 1. Functional deficits which require 3+ hours per day of interdisciplinary therapy in a comprehensive inpatient rehab setting. Physiatrist is providing close team supervision and 24 hour management of active medical problems listed below. Physiatrist and rehab team continue to assess barriers to discharge/monitor patient progress toward functional and medical goals  Care Tool:  Bathing    Body parts bathed by patient: Right arm, Left arm, Chest, Abdomen, Buttocks, Right upper leg, Left upper leg, Right lower leg, Front perineal area, Face, Left lower leg         Bathing assist Assist Level: Supervision/Verbal cueing     Upper Body Dressing/Undressing Upper body dressing   What is the patient wearing?: Pull over shirt    Upper body assist Assist Level: Independent    Lower Body Dressing/Undressing Lower body dressing      What is the patient wearing?: Pants     Lower body assist Assist for lower body dressing: Supervision/Verbal cueing      Toileting Toileting    Toileting assist Assist for toileting: Supervision/Verbal cueing     Transfers Chair/bed transfer  Transfers assist     Chair/bed transfer assist level: Supervision/Verbal cueing     Locomotion Ambulation   Ambulation assist      Assist level: Minimal Assistance - Patient > 75% Assistive device: No Device Max distance: 65'   Walk 10 feet activity   Assist     Assist level: Minimal Assistance - Patient > 75% Assistive device: No Device   Walk 50 feet activity   Assist    Assist level: Minimal Assistance - Patient > 75% Assistive device: No Device    Walk 150 feet activity   Assist Walk 150 feet activity did not occur: Safety/medical concerns         Walk 10 feet on uneven surface  activity   Assist     Assist level: Minimal Assistance - Patient > 75%     Wheelchair     Assist Is the patient using a wheelchair?: Yes Type of Wheelchair: Manual    Wheelchair assist level: Dependent - Patient 0% Max wheelchair distance: 150'    Wheelchair 50 feet with 2 turns activity    Assist        Assist Level: Dependent - Patient 0%   Wheelchair 150 feet activity     Assist      Assist Level: Dependent - Patient 0%   Blood pressure 101/81, pulse 81, temperature 98.8 F (37.1 C), resp. rate 18, height 6' 6 (1.981 m), weight 95.8 kg, SpO2 98%.  Medical Problem List and Plan: 1. Functional deficits secondary to debility related CHF/ LVAD placement             -patient may  shower             -ELOS/Goals: 5-7 days, mod I--discharge 1-27  - Stable to continue inpatient rehab  - Possible discharge this week if he has stable electrical power at home, support at home  2.  Antithrombotics: -DVT/anticoagulation:  Mechanical:  Antiembolism stockings, knee (TED hose) Bilateral lower extremities              -antiplatelet therapy: Was on apixaban  PTA for hx Afib > now with LVAD on warfarin  INR Goal: 2.0 - 2.5   coumadin  dosing per pharmacy.   3. Pain Management: Gabapentin  200 mg at bedtime. PRN: tylenol  and oxycodone    -  1-20: Pain well-controlled on current regimen.  4. Mood/Behavior/Sleep: LCSW to follow for evaluation and support when available.              -antipsychotic agents: n/a  5. Neuropsych/cognition: This patient is capable of making decisions on his own behalf. 6. Skin/Wound Care: Routine pressure relief measures. Continue sternal precautions.  Drive Line: Continue MWF dressing changes by VAD probation officer. Next dressing change due 11/04/24 by VAD coordinator or nurse champion. -Sacral wound: Continue Santyl  oint apply to yellow portion of sacral wound daily. Pressure relief, encourage appropriate nutrition - 1-20: Has had abdominal sutures since 12-31.  Will DC --ordered 1/21  - 1/21: Switched from LAL to gel mattress with frequent rolling for patient comfort--improved -1-23: Nursing staff provided education to wife on sacral dressing changes today.  LVAD team provided education on driveline changes.   7. Fluids/Electrolytes/Nutrition: Monitor strict I&O and daily weight.              -GERD: Protonix               -Ensure and vitamin supplements    8. Acute on chronic systolic CHF, NYHA IV: S/p HM III LVAD implant by Dr. Lucas 10/14/24. Advanced HF team following.              - Continue Lasix  40 mg daily with potassium 40 mEq, Sildenafil  20 mg TID for RV support. Digoxin  0.0625 mg daily. Dig level 0.9 1/13. Spironolactone  50 mg bid. Warfarin per pharmacy dosing.              -daily weights  - LVAD parameters stable, no d/d volume overload, appreciate cardiology recommendations  -1/24-25 no signs of volume overload noted, suspect weights are not accurate Filed Weights   11/06/24 0500 11/07/24 0502 11/08/24 0602  Weight: 91.2 kg 96.8 kg 95.8 kg    9. Pulmonary hypertension: Severe combined pre/post capillary PH on RHC-resolved    10. AKI on CKD stage 3: Scr  stable, follow BMP in AM.    - 1-20: Mild increase in creatinine, encourage p.o. fluids.  Continue to monitor--improving 1-22--repeat 1-23  -1/25 BUN and creatinine overall stable at 26/1.2  11. PVCs/NSVT: Improved. Has Biotronik ICD.              -Continue mexiletine 150 mg bid.    12. Persistent Atrial fibrillation: s/p DCCV 1/12, currently NSR.              -Continue amiodarone  200 mg bid and warfarin.    13. ABLA: Hgb 10.7, Monitor daily INR, CBC, and for s/sx of bleeding.    - Hemoglobin stable 11  14. Hypokalemia/Hypomagnesemia: On potassium 40 mEq, continue supplementation PRN.   - 1-20: Magnesium  2.0, potassium 4.6 on admission labs.  Monitor.  15. T2DM: A1c 6.0-- Holding home Metformin and Farxiga . Insulin  reqs significantly decreased since PO intake.  -Continue monitoring glucose ac/hs with SSI as needed.    - Resume oral medications as appropriate  -1/22: Creatinine improving.  P.o. intakes remain variable, 10 to 100%.  Resume Farxiga  5 mg daily for cardiac indications, monitor--BMP ordered 1-24 for monitoring- overall stable  -1/25 CBG stable continue to monitor  Recent Labs    11/07/24 2034 11/08/24 0528 11/08/24 1132  GLUCAP 123* 89 178*     16. Constipation: Small recorded on 1/19, pt still endrosing constipation.              -Miralax  now BID, Senokot-S at bedtime.  -give 60  cc sorbitol  tonight.  -PRN senokot and sorbitol .  - LBM 1/25 feeling of constipation improved with bowel movement today  17. Prostate cancer: s/p radiation.    18. Meningioma: Monitoring.    19.  Left ear pain.     - 1/20: Start Oxifloxacin 5 drops to the left ear daily for 7 days  - 1/22: Now seems some bulging behind tympanic membrane.  DC oxaflozane since, start Augmentin  875 every 12 hours for 5 days, add Claritin  10 mg daily for 5 days to clear sinuses and allow drainage 1-23: Symptoms resolved; continue antibiotics and Claritin  for 5 days total 1/25 reports no significant ear pain  today  20.  Indigestion  - Patient would like Tums instead of Mylanta, will order  -1/25 resolved today    LOS: 6 days A FACE TO FACE EVALUATION WAS PERFORMED  Murray Collier 11/08/2024, 3:17 PM     "
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
## 2024-11-08 NOTE — Progress Notes (Signed)
 Patients sacral wound care done,   11/08/24 1100  Wound 11/02/24 1719 Pressure Injury Sacrum Medial Deep Tissue Pressure Injury - Purple or maroon localized area of discolored intact skin or blood-filled blister due to damage of underlying soft tissue from pressure and/or shear.  Date First Assessed/Time First Assessed: 11/02/24 1719   Present on Original Admission: (c) Yes  Primary Wound Type: Pressure Injury  Location: Sacrum  Location Orientation: Medial  Staging: Deep Tissue Pressure Injury - Purple or maroon localized are...  Site / Wound Assessment Clean;Dry;Pink  Peri-wound Assessment Intact  Drainage Description No odor  Drainage Amount None  Treatment Cleansed  Dressing Type Foam - Lift dressing to assess site every shift  Dressing Changed Changed  Dressing Status Clean, Dry, Intact  Margins Unattached edges (unapproximated)

## 2024-11-09 ENCOUNTER — Other Ambulatory Visit: Payer: Self-pay

## 2024-11-09 ENCOUNTER — Other Ambulatory Visit (HOSPITAL_COMMUNITY): Payer: Self-pay

## 2024-11-09 ENCOUNTER — Telehealth (HOSPITAL_COMMUNITY): Payer: Self-pay

## 2024-11-09 LAB — CBC
HCT: 33 % — ABNORMAL LOW (ref 39.0–52.0)
Hemoglobin: 10.6 g/dL — ABNORMAL LOW (ref 13.0–17.0)
MCH: 30.6 pg (ref 26.0–34.0)
MCHC: 32.1 g/dL (ref 30.0–36.0)
MCV: 95.4 fL (ref 80.0–100.0)
Platelets: 194 10*3/uL (ref 150–400)
RBC: 3.46 MIL/uL — ABNORMAL LOW (ref 4.22–5.81)
RDW: 17.3 % — ABNORMAL HIGH (ref 11.5–15.5)
WBC: 7.2 10*3/uL (ref 4.0–10.5)
nRBC: 0 % (ref 0.0–0.2)

## 2024-11-09 LAB — GLUCOSE, CAPILLARY
Glucose-Capillary: 106 mg/dL — ABNORMAL HIGH (ref 70–99)
Glucose-Capillary: 93 mg/dL (ref 70–99)
Glucose-Capillary: 141 mg/dL — ABNORMAL HIGH (ref 70–99)
Glucose-Capillary: 143 mg/dL — ABNORMAL HIGH (ref 70–99)

## 2024-11-09 LAB — BASIC METABOLIC PANEL WITH GFR
Anion gap: 10 (ref 5–15)
BUN: 21 mg/dL (ref 8–23)
CO2: 22 mmol/L (ref 22–32)
Calcium: 8.6 mg/dL — ABNORMAL LOW (ref 8.9–10.3)
Chloride: 101 mmol/L (ref 98–111)
Creatinine, Ser: 1.12 mg/dL (ref 0.61–1.24)
GFR, Estimated: >60 mL/min
Glucose, Bld: 81 mg/dL (ref 70–99)
Potassium: 4.4 mmol/L (ref 3.5–5.1)
Sodium: 133 mmol/L — ABNORMAL LOW (ref 135–145)

## 2024-11-09 LAB — PROTIME-INR
INR: 2.1 — ABNORMAL HIGH (ref 0.8–1.2)
Prothrombin Time: 24.1 s — ABNORMAL HIGH (ref 11.4–15.2)

## 2024-11-09 MED ORDER — DAPAGLIFLOZIN PROPANEDIOL 5 MG PO TABS
5.0000 mg | ORAL_TABLET | Freq: Every day | ORAL | 0 refills | Status: DC
  Filled 2024-11-09: qty 30, 30d supply, fill #0

## 2024-11-09 MED ORDER — DIGOXIN 62.5 MCG PO TABS
0.0625 mg | ORAL_TABLET | Freq: Every day | ORAL | 0 refills | Status: AC
  Filled 2024-11-09: qty 30, 30d supply, fill #0

## 2024-11-09 MED ORDER — WARFARIN SODIUM 3 MG PO TABS
3.0000 mg | ORAL_TABLET | Freq: Once | ORAL | 0 refills | Status: DC
  Filled 2024-11-09: qty 40, 40d supply, fill #0

## 2024-11-09 MED ORDER — PANTOPRAZOLE SODIUM 40 MG PO TBEC
40.0000 mg | DELAYED_RELEASE_TABLET | Freq: Every day | ORAL | 0 refills | Status: AC
  Filled 2024-11-09: qty 30, 30d supply, fill #0

## 2024-11-09 MED ORDER — ZINC SULFATE 220 (50 ZN) MG PO TABS
220.0000 mg | ORAL_TABLET | Freq: Every day | ORAL | 0 refills | Status: AC
  Filled 2024-11-09: qty 11, 11d supply, fill #0

## 2024-11-09 MED ORDER — MEXILETINE HCL 150 MG PO CAPS
150.0000 mg | ORAL_CAPSULE | Freq: Two times a day (BID) | ORAL | 0 refills | Status: DC
  Filled 2024-11-09: qty 60, 30d supply, fill #0

## 2024-11-09 MED ORDER — POLYETHYLENE GLYCOL 3350 17 G PO PACK: 17.0000 g | PACK | Freq: Two times a day (BID) | ORAL | Status: AC

## 2024-11-09 MED ORDER — AMOXICILLIN-POT CLAVULANATE 875-125 MG PO TABS
1.0000 | ORAL_TABLET | Freq: Two times a day (BID) | ORAL | 0 refills | Status: DC
  Filled 2024-11-09: qty 1, 1d supply, fill #0

## 2024-11-09 MED ORDER — ATORVASTATIN CALCIUM 40 MG PO TABS
40.0000 mg | ORAL_TABLET | Freq: Every day | ORAL | 0 refills | Status: AC
  Filled 2024-11-09: qty 30, 30d supply, fill #0

## 2024-11-09 MED ORDER — OXYCODONE HCL 5 MG PO TABS
5.0000 mg | ORAL_TABLET | Freq: Four times a day (QID) | ORAL | 0 refills | Status: AC | PRN
  Filled 2024-11-09: qty 28, 7d supply, fill #0
  Filled 2024-11-09: qty 30, 7d supply, fill #0

## 2024-11-09 MED ORDER — SENNA 8.6 MG PO TABS
2.0000 | ORAL_TABLET | Freq: Every evening | ORAL | 0 refills | Status: AC | PRN
  Filled 2024-11-09: qty 60, 30d supply, fill #0

## 2024-11-09 MED ORDER — SPIRONOLACTONE 50 MG PO TABS
50.0000 mg | ORAL_TABLET | Freq: Every day | ORAL | 0 refills | Status: DC
  Filled 2024-11-09: qty 30, 30d supply, fill #0

## 2024-11-09 MED ORDER — ACETAMINOPHEN 325 MG PO TABS: 325.0000 mg | ORAL_TABLET | ORAL | Status: AC | PRN

## 2024-11-09 MED ORDER — SILDENAFIL CITRATE 20 MG PO TABS
20.0000 mg | ORAL_TABLET | Freq: Three times a day (TID) | ORAL | 0 refills | Status: AC
  Filled 2024-11-09: qty 90, 30d supply, fill #0

## 2024-11-09 MED ORDER — CALCIUM CARBONATE ANTACID 500 MG PO CHEW: 400.0000 mg | CHEWABLE_TABLET | Freq: Three times a day (TID) | ORAL | Status: AC | PRN

## 2024-11-09 MED ORDER — WARFARIN SODIUM 3 MG PO TABS
3.0000 mg | ORAL_TABLET | Freq: Once | ORAL | Status: AC
  Administered 2024-11-09: 3 mg via ORAL
  Filled 2024-11-09: qty 1

## 2024-11-09 MED ORDER — AMIODARONE HCL 200 MG PO TABS
200.0000 mg | ORAL_TABLET | Freq: Every day | ORAL | 0 refills | Status: AC
  Filled 2024-11-09: qty 30, 30d supply, fill #0

## 2024-11-09 MED ORDER — AMIODARONE HCL 200 MG PO TABS
200.0000 mg | ORAL_TABLET | Freq: Two times a day (BID) | ORAL | 0 refills | Status: DC
  Filled 2024-11-09: qty 60, 30d supply, fill #0

## 2024-11-09 MED ORDER — GABAPENTIN 100 MG PO CAPS
200.0000 mg | ORAL_CAPSULE | Freq: Every day | ORAL | 0 refills | Status: AC
  Filled 2024-11-09: qty 60, 30d supply, fill #0

## 2024-11-09 NOTE — Telephone Encounter (Signed)
 Pharmacy Patient Advocate Encounter  Insurance verification completed.    The patient is insured through [employer] [place] . Patient has [employer], may use a copay card, and/or apply for patient assistance if available.    Ran test claim for Farxiga  10mg  tablet and the current 30 day co-pay is $10.   This test claim was processed through [employer]- copay amounts may vary at other pharmacies due to [employer], or as the patient moves through the different stages of their insurance plan.

## 2024-11-09 NOTE — Progress Notes (Signed)
 PHARMACY - ANTICOAGULATION CONSULT NOTE  Pharmacy Consult for warfarin Indication: LVAD HM3 + hx AFib  Allergies[1]  Patient Measurements: Height: 6' 6 (198.1 cm) Weight: 97.4 kg (214 lb 11.2 oz) IBW/kg (Calculated) : 91.4  Vital Signs: Temp: 97.8 F (36.6 C) (01/26 0440) Temp Source: Oral (01/26 0440) BP: 101/76 (01/26 0440) Pulse Rate: 107 (01/26 0440)  Labs: Recent Labs    11/07/24 0732 11/08/24 0707 11/09/24 0445  HGB 10.6*  --  10.6*  HCT 33.2*  --  33.0*  PLT 192  --  194  LABPROT 29.9* 25.7* 24.1*  INR 2.7* 2.2* 2.1*  CREATININE 1.14 1.20 1.12    Estimated Creatinine Clearance: 87.3 mL/min (by C-G formula based on SCr of 1.12 mg/dL).   Medical History: Past Medical History:  Diagnosis Date   CKD (chronic kidney disease) stage 2, GFR 60-89 ml/min    COPD (chronic obstructive pulmonary disease) (HCC)    Diabetes mellitus type II, non insulin  dependent (HCC)    Dilated aortic root    Elevated PSA    Hypercholesteremia    Hypertension    NICM (nonischemic cardiomyopathy) (HCC)    NSVT (nonsustained ventricular tachycardia) (HCC)    Obstructive sleep apnea    Paroxysmal atrial fibrillation (HCC)    Pulmonary hypertension (HCC)    PVC's (premature ventricular contractions)    Tobacco abuse     Medications:  Scheduled:   amiodarone   200 mg Oral Daily   amoxicillin -clavulanate  1 tablet Oral Q12H   atorvastatin   40 mg Oral Daily   Chlorhexidine  Gluconate Cloth  6 each Topical Daily   collagenase    Topical Daily   dapagliflozin  propanediol  5 mg Oral Daily   digoxin   0.0625 mg Oral Daily   gabapentin   200 mg Oral QHS   insulin  aspart  0-15 Units Subcutaneous TID WC   loratadine   10 mg Oral Daily   mexiletine  150 mg Oral Q12H   multivitamin with minerals  1 tablet Oral Daily   pantoprazole   40 mg Oral Daily   polyethylene glycol  17 g Oral BID   senna-docusate  2 tablet Oral QHS   sildenafil   20 mg Oral TID   sodium chloride  flush  10-40 mL  Intracatheter Q12H   spironolactone   50 mg Oral Daily   Warfarin - Pharmacist Dosing Inpatient   Does not apply q1600   zinc  sulfate (50mg  elemental zinc )  220 mg Oral Q supper    Assessment: 63 yom who underwent LVAD HM3 placement on 12/31. Was on apixaban  PTA for hx Afib > now with LVAD plan to change to warfarin.   INR at goal today at 2.1. CBC stable.  Goal of Therapy:  INR 2-2.5 Monitor platelets by anticoagulation protocol: Yes   Plan:  Warfarin 3 mg x 1 again tonight.  Recommend warfarin 3 mg daily at discharge with INR recheck later this week if able. Monitor daily INR, CBC, and for s/sx of bleeding   Thank you for allowing pharmacy to participate in this patient's care,  James [name], James [name], [facility] Clinical Pharmacist  11/09/2024 7:18 AM   [facility] pharmacy phone numbers are listed on [email or web address]        [1] No Known Allergies

## 2024-11-09 NOTE — Therapy (Signed)
 Physical Therapy Session Note  Patient Details  Name: James [name] MRN: [id number] Date of Birth: 1961/04/13  Today's Date: 11/09/2024 PT Individual Time: 9151-9055 PT Individual Time Calculation (min): 56 min   Short Term Goals: Week 1:  PT Short Term Goal 1 (Week 1): STGs = LTGs  Skilled Therapeutic Interventions/Progress Updates:     Pt received seated in [facility] and agrees to therapy. No complaint of pain. Pt requires assistance to switch LVAD over to battery power, with difficulty sequencing and positioning batteries in vest. Pt stands and ambulates x175' to gym with rollator and cues for posture and increasing stride length to improve balance and efficiency of gait pattern. Seated rest break. Pt then completes x12 6 steps with bilateral hand rails and cues for step sequencing and safety. Pt ambulates multiple additional bouts to work on endurance, 3x175', with seated rest breaks. Pt left seated in recliner with all needs within reach.    Therapy Documentation Precautions:  Precautions Precautions: Fall Precaution Booklet Issued: No Precaution/Restrictions Comments: LVAD Restrictions Weight Bearing Restrictions Per Provider Order: Yes RUE Weight Bearing Per Provider Order: Non weight bearing LUE Weight Bearing Per Provider Order: Non weight bearing Other Position/Activity Restrictions: sternal precautions   Therapy/Group: Individual Therapy  [name]. PT, DPT 11/09/2024, 4:35 PM

## 2024-11-09 NOTE — Progress Notes (Signed)
 Physical Therapy Note  Patient Details  Name: Jade Burright MRN: [id number] Date of Birth: 09-14-1961 Today's Date: 11/09/2024    Due to the [place]  state of emergency, patients may not receive 3.0 hours of therapy services.    Dalaysia Harms 11/09/2024, 11:04 AM

## 2024-11-09 NOTE — Progress Notes (Signed)
 Patient ID: James [name], male   DOB: 1961-02-26, 64 y.o.   MRN: [id number]     Advanced Heart Failure Rounding Note   [facility] Cardiologist: Dr. Zenaida Chief Complaint: Post-op LVAD Patient Profile   Catarino Vold is a 64 y.o. male with history of stage D cardiomyopathy/chronic systolic heart failure now end-stage w/ inotrope dependence, CKD IIIa, hx VT, PAF, prostate cancer, meningioma.   Admitted for LVAD implant/ pre-VAD HF optimization.   Significant events:   12/18: Milrinone  titrated from 0.25 to 0.5 mcg/kg/min post RHC d/t low-output and pulmonary hypertension 12/19: NE added 12/22: Markedly elevated PA pressures, elevated PCWP and elevated SVR. Titrated off NE. Added DBA.  12/23: Impella 5.5 Implanted.  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 Heparin  > Bival.  12/31: HM III LVAD implant 1/5: LVAD speed increased to 5400  1/6: LUE weakness, head CT negative  1/9: Ramp echo, speed increased to 5500 rpm 1/11: Off milrinone . 1/12: bedside TEE/DCCV. EF 20%, mod/sev HK. VAD speed decreased to 5400 1/14: transferred out of CCU  1/15: repeat head CT negative for acute changes   Subjective:    Gets a little short of breath after walking down the hall and coming back.  Otherwise, feels fine.    Creatinine stable 1.12, INR 2.1   HeartMate 3 VAD Equipment Check Pump Speed (RPM): 5400 RPM Pump Flow (LPM): 4.7 Power (Watts): 4 Watts Pulsatility Index: 4.2 Fixed Speed Limit: 5400 rpm Low Speed Limit: 5100 rpm Alarms: No alarms Auscultated: Normal expected humming Power Module Self-Test (Daily): Done System Controller Self-Test: Passed Patient Battery Source: [facility] / Wall unit Emergency Equipment at Bedside: Yes   Objective:    Weight Range: 97.4 kg Body mass index is 24.81 kg/m.   Vital Signs:   Temp:  [97.8 F (36.6 C)-99 F (37.2 C)] 97.8 F (36.6 C) (01/26 0440) Pulse Rate:  [63-107] 98 (01/26 0827) Resp:  [16-18] 16 (01/26 0440) BP: (92-101)/(76-81) 101/76 (01/26  0440) SpO2:  [96 %-100 %] 96 % (01/26 0440) Weight:  [97.4 kg] 97.4 kg (01/26 0500) Last BM Date : 11/08/24  Doppler MAP: 83  Weight change: Filed Weights   11/07/24 0502 11/08/24 0602 11/09/24 0500  Weight: 96.8 kg 95.8 kg 97.4 kg   Intake/Output:  Intake/Output Summary (Last 24 hours) at 11/09/2024 1223 Last data filed at 11/08/2024 2000 Gross per 24 hour  Intake --  Output 400 ml  Net -400 ml    Physical Exam   General: Well appearing this am. NAD.  HEENT: Normal. Neck: Supple, JVP 7-8 cm. Carotids OK.  Cardiac:  Mechanical heart sounds with LVAD hum present.  Lungs:  CTAB, normal effort.  Abdomen:  NT, ND, no HSM. No bruits or masses. +BS  LVAD exit site: Well-healed and incorporated. Dressing dry and intact. No erythema or drainage. Stabilization device present and accurately applied. Driveline dressing changed daily per sterile technique. Extremities:  Warm and dry. No cyanosis, clubbing, rash, or edema.  Neuro:  Alert & oriented x 3. Cranial nerves grossly intact. Moves all 4 extremities w/o difficulty. Affect pleasant    Labs   CBC Recent Labs    11/07/24 0732 11/09/24 0445  WBC 7.7 7.2  HGB 10.6* 10.6*  HCT 33.2* 33.0*  MCV 96.0 95.4  PLT 192 194   Basic Metabolic Panel Recent Labs    98/74/73 0707 11/09/24 0445  NA 136 133*  K 4.1 4.4  CL 100 101  CO2 27 22  GLUCOSE 95 81  BUN 26* 21  CREATININE 1.20 1.12  CALCIUM  8.8* 8.6*   Liver Function Tests No results for input(s): AST, ALT, ALKPHOS, BILITOT, PROT, ALBUMIN  in the last 72 hours.  BNP (last 3 results) Recent Labs    06/10/24 1213 07/09/24 1649 07/23/24 0959  BNP 1,071.5* 3,316.0* 595.8*   ProBNP (last 3 results) Recent Labs    10/15/24 0400 10/21/24 0433 10/28/24 0559  PROBNP 9,508.0* 5,277.0* 8,170.0*   Medications:    Scheduled Medications:  amiodarone   200 mg Oral Daily   amoxicillin -clavulanate  1 tablet Oral Q12H   atorvastatin   40 mg Oral Daily    Chlorhexidine  Gluconate Cloth  6 each Topical Daily   dapagliflozin  propanediol  5 mg Oral Daily   digoxin   0.0625 mg Oral Daily   gabapentin   200 mg Oral QHS   insulin  aspart  0-15 Units Subcutaneous TID WC   mexiletine  150 mg Oral Q12H   multivitamin with minerals  1 tablet Oral Daily   pantoprazole   40 mg Oral Daily   polyethylene glycol  17 g Oral BID   senna-docusate  2 tablet Oral QHS   sildenafil   20 mg Oral TID   sodium chloride  flush  10-40 mL Intracatheter Q12H   spironolactone   50 mg Oral Daily   warfarin  3 mg Oral ONCE-1600   Warfarin - Pharmacist Dosing Inpatient   Does not apply q1600   zinc  sulfate (50mg  elemental zinc )  220 mg Oral Q supper    Infusions:    PRN Medications: acetaminophen , alum & mag hydroxide-simeth, bisacodyl , calcium  carbonate, diphenhydrAMINE , guaiFENesin -dextromethorphan , lidocaine , mouth rinse, oxyCODONE , phenol, prochlorperazine  **OR** prochlorperazine  **OR** prochlorperazine , senna  Assessment/Plan   Acute on chronic systolic CHF, NYHA IV: Nonischemic cardiomyopathy. RHC 12/18 showed elevated filling pressures with mod-severe mixed pulmonary arterial/pulmonary venous hypertension and low CO despite milrinone  0.25, increased to 0.5. Impella 5.5 placed 12/23 for further optimization. - S/p HM III LVAD implant by Dr. Lucas 10/14/24. - Ramp echo 1/9, increased speed to 5500 rpm. TEE 1/12 decreased speed to 5400. - Volume ok. Off diuretics with orthostasis - Continue sildenafil  20 mg TID for RV support - Continue digoxin  0.0625 mg daily. Dig level 0.6 1/24 - Continue spironolactone  to 50 mg daily. BMET stable.  - Doppler MAPs 80s - INR 2.1 Discussed warfarin dosing with PharmD personally.  PVCs/NSVT: Improved. Has Biotronik ICD. Tachy therapies turned on  - Continue mexiletine 150 mg bid and amio  Persistent Atrial fibrillation:  - S/P DCCV 1/12. Remains in NSR - Continue amiodarone  200 daily  - on warfarin  Pulmonary hypertension:  Severe combined pre/post capillary PH on RHC - resolved post VAD  CKD stage 3:  - Scr stable at 1.12 today  Hypokalemia/ Hypomagnesemia  - resolved  HLD: continue atorvastatin  40 mg daily  Meningioma: Monitoring.   Prostate cancer: Treated with radiation.   Deconditioning: Per CIR team.  Stable today, discharge home when rehab team deems him ready.  Suspect soon.   Ezra Shuck, MD 11/09/24, 12:23 PM  Advanced Heart Failure Team Pager [phone] (M-F; 7a - 5p)    Please visit [email or web address]: For overnight coverage please call cardiology fellow first. If fellow not available call Shock/ECMO MD on call.  For ECMO / Mechanical Support (Impella, IABP, LVAD) issues call Shock / ECMO MD on call.

## 2024-11-09 NOTE — Progress Notes (Signed)
 LVAD Coordinator Rounding Note:  Admitted 10/01/24 for optimization prior to LVAD implant. Impella 5.5 placed 10/06/24.  HM 3 LVAD implanted on 10/14/24 by Dr Lucas under destination therapy criteria.  12/18: Milrinone  titrated from 0.25 to 0.5 mcg/kg/min post RHC d/t low-output and pulmonary hypertension 12/19: NE added 12/22: Markedly elevated PA pressures, elevated PCWP and elevated SVR. Titrated off NE. Added DBA.  12/23: Impella 5.5 Implanted.  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 Heparin  > Bival.  12/31: HM III LVAD implant 1/5: LVAD speed increased to 5400  1/6: LUE weakness, head CT negative  1/9: Ramp echo, speed increased to 5500 rpm 1/11: Off milrinone . 1/12: bedside TEE/DCCV. EF 20%, mod/sev HK. VAD speed decreased to 5400 14: transferred out of CCU  1/15: repeat head CT negative for acute changes. 1/19: Discharged to CIR  Pt sitting up in the recliner after therapy this morning. Denies complaints. Changed power source this morning for VAD coordinator- pt completed successfully Pt seemed clear today during conversation. Oriented x 4.  Discharge education completed 10/28/24. Tentative plan for discharge Tuesday 11/10/24. VAD coordinator spoke with pt's wife [name] this afternoon. She said while they do have power, the roads out by their house have not been plowed and are not driveable at this time. She plans to call the VAD coordinators in the morning to let us  know if she is able to safely get to the hospital to pick up pt. She must be present for any/all discharge teaching from rehab as pt has been experiencing some cognitive impairment during his hospitalization. Shared this information with rehab MD, rehab CSW, and Greig Mosses NP.   Home VAD equipment has arrived.   Vital signs: Temp: 98.3 HR: 82 Doppler Pressure: not documented Automatic BP: 112/97 (104) O2 Sat: 99% on RA Wt: 235.4>244.3>243.4>234.8>223.5>239.6>220.2>224.2>214>210.9>214>200.8>200.6>201.6>201.6>214.7 lb    LVAD  interrogation reveals:  Speed: 5400 Flow: 4.9 Power: 3.9 w PI: 3.6   Alarms: none Events: 1 PI so far today Hematocrit: 33  Fixed speed: 5400 Low speed limit: 5100   Drive Line: Existing VAD dressing removed and site care performed using sterile technique by VAD coordinator. Drive line exit site cleaned with Chlora prep applicators x 2, allowed to dry, and gauze dressing with Silverlon patch applied. Exit site unincorporated, the velour is fully implanted at exit site. Hub exposed directly at exit site. Small amount of serous drainage present on previous dressing. No redness, tenderness, foul odor or rash noted Cath grip anchor replaced Continue MWF dressing changes by VAD coordinator or nurse champion. Next dressing change due 11/11/24 by VAD coordinator or nurse champion.     Labs:  LDH trend: 703>615>541>598>724>405>366>294>243>295>594>256>325>218  INR trend: 1.3>1.4>1.4>1.6>1.7>1.7>2.0>2.1>2.2>2.3>2.2>1.8>1.7>2.0>2.0>2.6>2.1  Anticoagulation Plan: - INR Goal: 2.0 - 2.5 - Coumadin  dosing per pharmacy  Blood Products:  Intra-op 10/14/24:   4 FFP  2 platelets  700 cellsaver  DDAVP  x 2 Post-op:  10/15/24: 1 PRBC  Device: - Biotronik -Therapies: ON 10/29/24  Arrythmias: cardioverted from afib on 10/26/24  Respiratory: extubated 10/15/24  Infection:   Drips:  Milrinone  0.125 mcg/kg/min-off Amiodarone  30 mg/hr-off  Adverse Events on VAD:  Patient Education:  Completed 10/28/24. See separate note for details Ongoing dressing change teaching with pt's wife [name]  Plan/Recommendations:  1. Page VAD coordinator for any equipment or drive line concerns 2. Continue Monday, Wednesday, Friday drive line dressing changes by VAD coordinator or nurse champion  Isaiah Knoll RN VAD Coordinator  Office: [phone]  24/7 Pager: [phone]

## 2024-11-09 NOTE — Progress Notes (Signed)
 "                                                        PROGRESS NOTE   Subjective/Complaints: No events overnight. No acute complaints.  States his sacral wound is feeling and looking better, he is working hard on remembering to offload when laying or reclining.  Otherwise, no complaints.  Gets slightly fatigued with repeated steps or long distances.  Looking forward to going home.  Vitals stable  overall, mild tachycardia this Am 1x which self resolved    11/09/2024    8:27 AM 11/09/2024    5:00 AM 11/09/2024    4:40 AM  Vitals with BMI  Weight  214 lbs 11 oz   BMI  24.82   Systolic   101  Diastolic   76  Pulse 98  107    Recent Labs    11/08/24 1624 11/08/24 2028 11/09/24 0541  GLUCAP 131* 119* 106*    AM labs with slightly decreased Na 133 (stable from priors), BUN down to 21, Cr down to 1.12, others stable P.o. intakes appropriate   Continent of bladder   Last BM 1/24, medium   ROS: Denies fevers, chills, N/V, abdominal pain, constipation, diarrhea, SOB, cough, chest pain, new weakness or paraesthesias.     Objective:   No results found. Recent Labs    11/07/24 0732 11/09/24 0445  WBC 7.7 7.2  HGB 10.6* 10.6*  HCT 33.2* 33.0*  PLT 192 194   Recent Labs    11/08/24 0707 11/09/24 0445  NA 136 133*  K 4.1 4.4  CL 100 101  CO2 27 22  GLUCOSE 95 81  BUN 26* 21  CREATININE 1.20 1.12  CALCIUM  8.8* 8.6*    Intake/Output Summary (Last 24 hours) at 11/09/2024 9051 Last data filed at 11/08/2024 2000 Gross per 24 hour  Intake --  Output 625 ml  Net -625 ml     Wound 11/02/24 1719 Pressure Injury Sacrum Medial Deep Tissue Pressure Injury - Purple or maroon localized area of discolored intact skin or blood-filled blister due to damage of underlying soft tissue from pressure and/or shear. (Active)    Physical Exam: Vital Signs Blood pressure 101/76, pulse 98, temperature 97.8 F (36.6 C), temperature source Oral, resp. rate 16, height 6' 6 (1.981 m),  weight 97.4 kg, SpO2 96%. Constitutional: No apparent distress. Appropriate appearance for age.  Reclining in bedside chair. HENT: No JVD. Neck Supple. Trachea midline. Atraumatic, normocephalic.  Eyes: PERRLA. EOMI. Visual fields grossly intact.  Cardiovascular: Audible electronic thrum  No Edema.    Respiratory: CTAB. No rales, rhonchi, or wheezing. On RA.  Abdomen: + bowel sounds, normoactive. No distention or tenderness.  Skin: Driveline site clean/dry/intact.  Sacral wound; unable to examine due to closing, dressing-covered    MSK:      No apparent deformity.      Strength:                RUE: 5/5 SA, 5/5 EF, 5/5 EE, 5/5 WE, 5/5 FF, 5/5 FA                 LUE: 5/5 SA, 5/5 EF, 5/5 EE, 5/5 WE, 5/5 FF, 5/5 FA  RLE: 5/5 HF, 5/5 KE, 5/5 DF, 5/5 EHL, 5/5 PF                 LLE:  5/5 HF, 5/5 KE, 5/5 DF, 5/5 EHL, 5/5 PF   Neurologic exam:  Cognition: AAO to person, place, time and event.  + Significant processing, cognitive deficits.  Ongoing.  Language: Fluent, No substitutions or neoglisms. No dysarthria.  Insight: Fair insight into current condition.  Sensation: Equal and intact in BL UE and Les.  Reflexes: 2+ in BL UE and LEs. Negative Hoffman's and babinski signs bilaterally.  CN: 2-12 grossly intact.  Coordination: No apparent tremors. No ataxia on FTN, HTS bilaterally.  Physical exam unchanged from the above on reexamination 11/09/24      Assessment/Plan: 1. Functional deficits which require 3+ hours per day of interdisciplinary therapy in a comprehensive inpatient rehab setting. Physiatrist is providing close team supervision and 24 hour management of active medical problems listed below. Physiatrist and rehab team continue to assess barriers to discharge/monitor patient progress toward functional and medical goals  Care Tool:  Bathing    Body parts bathed by patient: Right arm, Left arm, Chest, Abdomen, Buttocks, Right upper leg, Left upper leg, Right  lower leg, Front perineal area, Face, Left lower leg         Bathing assist Assist Level: Supervision/Verbal cueing     Upper Body Dressing/Undressing Upper body dressing   What is the patient wearing?: Pull over shirt    Upper body assist Assist Level: Independent    Lower Body Dressing/Undressing Lower body dressing      What is the patient wearing?: Pants     Lower body assist Assist for lower body dressing: Supervision/Verbal cueing     Toileting Toileting    Toileting assist Assist for toileting: Supervision/Verbal cueing     Transfers Chair/bed transfer  Transfers assist     Chair/bed transfer assist level: Supervision/Verbal cueing     Locomotion Ambulation   Ambulation assist      Assist level: Minimal Assistance - Patient > 75% Assistive device: No Device Max distance: 65'   Walk 10 feet activity   Assist     Assist level: Minimal Assistance - Patient > 75% Assistive device: No Device   Walk 50 feet activity   Assist    Assist level: Minimal Assistance - Patient > 75% Assistive device: No Device    Walk 150 feet activity   Assist Walk 150 feet activity did not occur: Safety/medical concerns         Walk 10 feet on uneven surface  activity   Assist     Assist level: Minimal Assistance - Patient > 75%     Wheelchair     Assist Is the patient using a wheelchair?: Yes Type of Wheelchair: Manual    Wheelchair assist level: Dependent - Patient 0% Max wheelchair distance: 150'    Wheelchair 50 feet with 2 turns activity    Assist        Assist Level: Dependent - Patient 0%   Wheelchair 150 feet activity     Assist      Assist Level: Dependent - Patient 0%   Blood pressure 101/76, pulse 98, temperature 97.8 F (36.6 C), temperature source Oral, resp. rate 16, height 6' 6 (1.981 m), weight 97.4 kg, SpO2 96%.  Medical Problem List and Plan: 1. Functional deficits secondary to debility  related CHF/ LVAD placement             -  patient may  shower             -ELOS/Goals: 5-7 days, mod I--discharge 1-27--pending confirmation of power at home  - Stable to continue inpatient rehab  - Possible discharge this week if he has stable electrical power at home, support at home  2.  Antithrombotics: -DVT/anticoagulation:  Mechanical:  Antiembolism stockings, knee (TED hose) Bilateral lower extremities              -antiplatelet therapy: Was on apixaban  PTA for hx Afib > now with LVAD on warfarin  INR Goal: 2.0 - 2.5  coumadin  dosing per pharmacy.   3. Pain Management: Gabapentin  200 mg at bedtime. PRN: tylenol  and oxycodone    - 1-20: Pain well-controlled on current regimen.  4. Mood/Behavior/Sleep: LCSW to follow for evaluation and support when available.              -antipsychotic agents: n/a  5. Neuropsych/cognition: This patient is capable of making decisions on his own behalf. 6. Skin/Wound Care: Routine pressure relief measures. Continue sternal precautions.  Drive Line: Continue MWF dressing changes by VAD probation officer. Next dressing change due 11/04/24 by VAD coordinator or nurse champion. -Sacral wound: Continue Santyl  oint apply to yellow portion of sacral wound daily. Pressure relief, encourage appropriate nutrition - 1-20: Has had abdominal sutures since 12-31.  Will DC --ordered 1/21  - 1/21: Switched from LAL to gel mattress with frequent rolling for patient comfort--improved -1-23: Nursing staff provided education to wife on sacral dressing changes today.  LVAD team provided education on driveline changes. 1-26: Patient doing very well remembering to offload his sacrum on his own; repeated education today on this.   7. Fluids/Electrolytes/Nutrition: Monitor strict I&O and daily weight.              -GERD: Protonix               -Ensure and vitamin supplements    8. Acute on chronic systolic CHF, NYHA IV: S/p HM III LVAD implant by Dr. Lucas  10/14/24. Advanced HF team following.              - Continue Lasix  40 mg daily with potassium 40 mEq, Sildenafil  20 mg TID for RV support. Digoxin  0.0625 mg daily. Dig level 0.9 1/13. Spironolactone  50 mg bid. Warfarin per pharmacy dosing.              -daily weights  - LVAD parameters stable, no d/d volume overload, appreciate cardiology recommendations  -1/24-26 no signs of volume overload noted, suspect weights are not accurate Filed Weights   11/07/24 0502 11/08/24 0602 11/09/24 0500  Weight: 96.8 kg 95.8 kg 97.4 kg    9. Pulmonary hypertension: Severe combined pre/post capillary PH on RHC-resolved    10. AKI on CKD stage 3: Scr stable, follow BMP in AM.    - 1-20: Mild increase in creatinine, encourage p.o. fluids.  Continue to monitor--improving 1-22--repeat 1-23  -1/25 BUN and creatinine overall stable at 26/1.2  11. PVCs/NSVT: Improved. Has Biotronik ICD.              -Continue mexiletine 150 mg bid.    12. Persistent Atrial fibrillation: s/p DCCV 1/12, currently NSR.              -Continue amiodarone  200 mg bid and warfarin.    13. ABLA: Hgb 10.7, Monitor daily INR, CBC, and for s/sx of bleeding.    - Hemoglobin stable 11  14. Hypokalemia/Hypomagnesemia: On potassium  40 mEq, continue supplementation PRN.   - 1-20: Magnesium  2.0, potassium 4.6 on admission labs.  Monitor.  15. T2DM: A1c 6.0-- Holding home Metformin and Farxiga . Insulin  reqs significantly decreased since PO intake.  -Continue monitoring glucose ac/hs with SSI as needed.    - Resume oral medications as appropriate  -1/22: Creatinine improving.  P.o. intakes remain variable, 10 to 100%.  Resume Farxiga  5 mg daily for cardiac indications, monitor--BMP ordered 1-24 for monitoring- overall stable  -1/25 CBG stable continue to monitor  Recent Labs    11/08/24 1624 11/08/24 2028 11/09/24 0541  GLUCAP 131* 119* 106*     16. Constipation: Small recorded on 1/19, pt still endrosing constipation.               -Miralax  now BID, Senokot-S at bedtime.  -give 60 cc sorbitol  tonight.  -PRN senokot and sorbitol .  - LBM 1/25 feeling of constipation improved with bowel movement today  17. Prostate cancer: s/p radiation.    18. Meningioma: Monitoring.    19.  Left ear pain.     - 1/20: Start Oxifloxacin 5 drops to the left ear daily for 7 days  - 1/22: Now seems some bulging behind tympanic membrane.  DC oxaflozane since, start Augmentin  875 every 12 hours for 5 days, add Claritin  10 mg daily for 5 days to clear sinuses and allow drainage  - resolved  20.  Indigestion  - Patient would like Tums instead of Mylanta, will order  -1/25 resolved today    LOS: 7 days A FACE TO FACE EVALUATION WAS PERFORMED  Joesph JAYSON Likes 11/09/2024, 9:48 AM     "

## 2024-11-09 NOTE — Progress Notes (Signed)
" °   11/09/24 1700  Wound 11/02/24 1719 Pressure Injury Sacrum Medial Deep Tissue Pressure Injury - Purple or maroon localized area of discolored intact skin or blood-filled blister due to damage of underlying soft tissue from pressure and/or shear.  Date First Assessed/Time First Assessed: 11/02/24 1719   Present on Original Admission: (c) Yes  Primary Wound Type: Pressure Injury  Location: Sacrum  Location Orientation: Medial  Staging: Deep Tissue Pressure Injury - Purple or maroon localized are...  Site / Wound Assessment Clean;Dry;Pink  Peri-wound Assessment Intact  Drainage Amount None  Treatment Cleansed  Dressing Type Foam - Lift dressing to assess site every shift  Dressing Changed Changed  Dressing Status Clean, Dry, Intact  Margins Unattached edges (unapproximated)   New dressing completed, sacral area healing "
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
## 2024-11-10 ENCOUNTER — Other Ambulatory Visit (HOSPITAL_COMMUNITY): Payer: Self-pay

## 2024-11-10 LAB — CBC
HCT: 35.1 % — ABNORMAL LOW (ref 39.0–52.0)
Hemoglobin: 11.2 g/dL — ABNORMAL LOW (ref 13.0–17.0)
MCH: 30.4 pg (ref 26.0–34.0)
MCHC: 31.9 g/dL (ref 30.0–36.0)
MCV: 95.4 fL (ref 80.0–100.0)
Platelets: 189 10*3/uL (ref 150–400)
RBC: 3.68 MIL/uL — ABNORMAL LOW (ref 4.22–5.81)
RDW: 17.1 % — ABNORMAL HIGH (ref 11.5–15.5)
WBC: 6.7 10*3/uL (ref 4.0–10.5)
nRBC: 0 % (ref 0.0–0.2)

## 2024-11-10 LAB — BASIC METABOLIC PANEL WITH GFR
Anion gap: 8 (ref 5–15)
BUN: 19 mg/dL (ref 8–23)
CO2: 25 mmol/L (ref 22–32)
Calcium: 8.8 mg/dL — ABNORMAL LOW (ref 8.9–10.3)
Chloride: 101 mmol/L (ref 98–111)
Creatinine, Ser: 1.17 mg/dL (ref 0.61–1.24)
GFR, Estimated: >60 mL/min
Glucose, Bld: 118 mg/dL — ABNORMAL HIGH (ref 70–99)
Potassium: 4.2 mmol/L (ref 3.5–5.1)
Sodium: 133 mmol/L — ABNORMAL LOW (ref 135–145)

## 2024-11-10 LAB — GLUCOSE, CAPILLARY
Glucose-Capillary: 122 mg/dL — ABNORMAL HIGH (ref 70–99)
Glucose-Capillary: 104 mg/dL — ABNORMAL HIGH (ref 70–99)

## 2024-11-10 LAB — PROTIME-INR
INR: 1.8 — ABNORMAL HIGH (ref 0.8–1.2)
Prothrombin Time: 21.8 s — ABNORMAL HIGH (ref 11.4–15.2)

## 2024-11-10 LAB — LACTATE DEHYDROGENASE: LDH: 202 U/L (ref 105–235)

## 2024-11-10 MED ORDER — DAPAGLIFLOZIN PROPANEDIOL 10 MG PO TABS
10.0000 mg | ORAL_TABLET | Freq: Every day | ORAL | 0 refills | Status: AC
  Filled 2024-11-10: qty 30, 30d supply, fill #0

## 2024-11-10 MED ORDER — COLLAGENASE 250 UNIT/GM EX OINT
TOPICAL_OINTMENT | Freq: Every day | CUTANEOUS | Status: DC
  Filled 2024-11-10: qty 30

## 2024-11-10 MED ORDER — WARFARIN SODIUM 3 MG PO TABS: 3.0000 mg | ORAL_TABLET | Freq: Once | ORAL | 0 refills | Status: DC

## 2024-11-10 MED ORDER — COLLAGENASE 250 UNIT/GM EX OINT
TOPICAL_OINTMENT | Freq: Every day | CUTANEOUS | 0 refills | Status: AC
  Filled 2024-11-10: qty 30, 30d supply, fill #0

## 2024-11-10 MED ORDER — WARFARIN SODIUM 4 MG PO TABS
4.0000 mg | ORAL_TABLET | Freq: Once | ORAL | 0 refills | Status: AC
  Filled 2024-11-10: qty 30, 30d supply, fill #0

## 2024-11-10 MED ORDER — DAPAGLIFLOZIN PROPANEDIOL 10 MG PO TABS: 10.0000 mg | ORAL_TABLET | Freq: Every day | ORAL | Status: DC

## 2024-11-10 MED ORDER — WARFARIN SODIUM 4 MG PO TABS
4.0000 mg | ORAL_TABLET | Freq: Once | ORAL | Status: DC
  Filled 2024-11-10: qty 1

## 2024-11-10 NOTE — Progress Notes (Signed)
 Patient ID: James [name], male   DOB: 05-19-1961, 64 y.o.   MRN: [id number]     Advanced Heart Failure Rounding Note   [facility] Cardiologist: Dr. Zenaida Chief Complaint: Post-op LVAD Patient Profile  James [name] is a 64 y.o. male with history of stage D cardiomyopathy/chronic systolic heart failure now end-stage w/ inotrope dependence, CKD IIIa, hx VT, PAF, prostate cancer, meningioma.   Admitted for LVAD implant/ pre-VAD HF optimization.  Significant events:  12/18: Milrinone  titrated from 0.25 to 0.5 mcg/kg/min post RHC d/t low-output and pulmonary hypertension 12/19: NE added 12/22: Markedly elevated PA pressures, elevated PCWP and elevated SVR. Titrated off NE. Added DBA.  12/23: Impella 5.5 Implanted.  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 Heparin  > Bival.  12/31: HM III LVAD implant 1/5: LVAD speed increased to 5400  1/6: LUE weakness, head CT negative  1/9: Ramp echo, speed increased to 5500 rpm 1/11: Off milrinone . 1/12: bedside TEE/DCCV. EF 20%, mod/sev HK. VAD speed decreased to 5400 1/14: transferred out of CCU  1/15: repeat head CT negative for acute changes   Subjective:    Feels great this morning.   Creatinine stable, INR 1.8   HeartMate 3 VAD Equipment Check Pump Speed (RPM): 5400 RPM Pump Flow (LPM): 4.8 Power (Watts): 4 Watts Pulsatility Index: 3.5 Fixed Speed Limit: 5400 rpm Low Speed Limit: 5100 rpm Alarms: No alarms Auscultated: Normal expected humming Power Module Self-Test (Daily): Done System Controller Self-Test: Passed Patient Battery Source: [facility] / Wall unit Emergency Equipment at Bedside: Yes No PI events today.   Objective:    Weight Range: 96.8 kg Body mass index is 24.66 kg/m.   Vital Signs:   Temp:  [97.8 F (36.6 C)-98.3 F (36.8 C)] 97.8 F (36.6 C) (01/27 0500) Pulse Rate:  [71-98] 71 (01/27 0434) Resp:  [18-19] 18 (01/27 0434) BP: (94-118)/(70-97) 110/80 (01/27 0500) SpO2:  [98 %-99 %] 99 % (01/27 0434) Weight:  [96.8 kg] 96.8 kg  (01/27 0500) Last BM Date : 11/08/24  Doppler MAP: 94  Weight change: Filed Weights   11/08/24 0602 11/09/24 0500 11/10/24 0500  Weight: 95.8 kg 97.4 kg 96.8 kg   Intake/Output:  Intake/Output Summary (Last 24 hours) at 11/10/2024 0746 Last data filed at 11/10/2024 0600 Gross per 24 hour  Intake 996 ml  Output 1225 ml  Net -229 ml    Physical Exam  General:  Well appearing. No resp difficulty Neck:  JVP ~6.  Cor: Mechanical heart sounds with LVAD hum present. Lungs: Clear Driveline: C/D/I; securement device intact and driveline incorporated Extremities: no edema Neuro: alert & oriented x3. Affect pleasant   Labs   CBC Recent Labs    11/09/24 0445 11/10/24 0604  WBC 7.2 6.7  HGB 10.6* 11.2*  HCT 33.0* 35.1*  MCV 95.4 95.4  PLT 194 189   Basic Metabolic Panel Recent Labs    98/73/73 0445 11/10/24 0604  NA 133* 133*  K 4.4 4.2  CL 101 101  CO2 22 25  GLUCOSE 81 118*  BUN 21 19  CREATININE 1.12 1.17  CALCIUM  8.6* 8.8*   Liver Function Tests No results for input(s): AST, ALT, ALKPHOS, BILITOT, PROT, ALBUMIN  in the last 72 hours.  BNP (last 3 results) Recent Labs    06/10/24 1213 07/09/24 1649 07/23/24 0959  BNP 1,071.5* 3,316.0* 595.8*   ProBNP (last 3 results) Recent Labs    10/15/24 0400 10/21/24 0433 10/28/24 0559  PROBNP 9,508.0* 5,277.0* 8,170.0*   Medications:    Scheduled Medications:  amiodarone   200 mg Oral Daily   atorvastatin   40 mg Oral Daily   Chlorhexidine  Gluconate Cloth  6 each Topical Daily   dapagliflozin  propanediol  5 mg Oral Daily   digoxin   0.0625 mg Oral Daily   gabapentin   200 mg Oral QHS   insulin  aspart  0-15 Units Subcutaneous TID WC   mexiletine  150 mg Oral Q12H   multivitamin with minerals  1 tablet Oral Daily   pantoprazole   40 mg Oral Daily   polyethylene glycol  17 g Oral BID   senna-docusate  2 tablet Oral QHS   sildenafil   20 mg Oral TID   sodium chloride  flush  10-40 mL Intracatheter Q12H    spironolactone   50 mg Oral Daily   warfarin  4 mg Oral ONCE-1600   Warfarin - Pharmacist Dosing Inpatient   Does not apply q1600   zinc  sulfate (50mg  elemental zinc )  220 mg Oral Q supper    Infusions:    PRN Medications: acetaminophen , alum & mag hydroxide-simeth, bisacodyl , calcium  carbonate, diphenhydrAMINE , guaiFENesin -dextromethorphan , lidocaine , mouth rinse, oxyCODONE , phenol, prochlorperazine  **OR** prochlorperazine  **OR** prochlorperazine , senna  Assessment/Plan   Acute on chronic systolic CHF, NYHA IV: Nonischemic cardiomyopathy. RHC 12/18 showed elevated filling pressures with mod-severe mixed pulmonary arterial/pulmonary venous hypertension and low CO despite milrinone  0.25, increased to 0.5. Impella 5.5 placed 12/23 for further optimization. - S/p HM III LVAD implant by Dr. Lucas 10/14/24. - Ramp echo 1/9, increased speed to 5500 rpm. TEE 1/12 decreased speed to 5400. - Volume ok. Off diuretics with orthostasis - Continue sildenafil  20 mg TID for RV support - Continue digoxin  0.0625 mg daily. Dig level 0.6 1/24 - Continue spironolactone  to 50 mg daily. BMET stable.  - Doppler MAPs 90s - INR 1.8 Discussed warfarin dosing with PharmD personally.  PVCs/NSVT: Improved. Has Biotronik ICD. Tachy therapies turned on  - Continue mexiletine 150 mg bid and amio  Persistent Atrial fibrillation:  - S/P DCCV 1/12. Remains in NSR - Continue amiodarone  200 daily  - on warfarin  Pulmonary hypertension: Severe combined pre/post capillary PH on RHC - resolved post VAD  CKD stage 3:  - Scr WNL  Hypokalemia/ Hypomagnesemia  - resolved  HLD: continue atorvastatin  40 mg daily  Meningioma: Monitoring.   Prostate cancer: Treated with radiation.   Deconditioning: Per CIR team.  Stable today, discharge home when rehab team deems him ready.  Tentative discharge today.   James LITTIE Coe, NP 11/10/24, 7:46 AM  Advanced Heart Failure Team Pager [phone] (M-F; 7a - 5p)     Please visit [email or web address]: For overnight coverage please call cardiology fellow first. If fellow not available call Shock/ECMO MD on call.  For ECMO / Mechanical Support (Impella, IABP, LVAD) issues call Shock / ECMO MD on call.

## 2024-11-10 NOTE — Progress Notes (Addendum)
 LVAD Coordinator Rounding Note:  Admitted 10/01/24 for optimization prior to LVAD implant. Impella 5.5 placed 10/06/24.  HM 3 LVAD implanted on 10/14/24 by Dr Lucas under destination therapy criteria.  12/18: Milrinone  titrated from 0.25 to 0.5 mcg/kg/min post RHC d/t low-output and pulmonary hypertension 12/19: NE added 12/22: Markedly elevated PA pressures, elevated PCWP and elevated SVR. Titrated off NE. Added DBA.  12/23: Impella 5.5 Implanted.  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 Heparin  > Bival.  12/31: HM III LVAD implant 1/5: LVAD speed increased to 5400  1/6: LUE weakness, head CT negative  1/9: Ramp echo, speed increased to 5500 rpm 1/11: Off milrinone . 1/12: bedside TEE/DCCV. EF 20%, mod/sev HK. VAD speed decreased to 5400 14: transferred out of CCU  1/15: repeat head CT negative for acute changes. 1/19: Discharged to CIR  Pt sitting up in the recliner this morning. Denies complaints. Plan for discharge today. VAD discharge instructions reinforced with pt and wife [name] at bedside. Home VAD equipment delivered to room and reviewed with [name]. Pt successfully transferred. Follow up appointment made.    Discharge equipment includes:  1. Two system controllers. 2. Mobile Power Unit (MPU) with 20 patient cable 3. One universal magazine features editor (UBC) 4. Eight fully charged batteries  5. Four battery clips 6. One travel case 7. One holster vest 8. 1 Consolidated bag  8. Wearable accessory package 9. Daily dressing kits, anchors, Aquacel (silver dressing)   VAD Education:    1. Reviewed importance of having a 24 hour caregiver 2. Reviewed dressing change frequency 3. Reviewed when to call the VAD pager and made sure they have phone number in their phone.  4. Reviewed importance of changing one power source at a time 5. Reviewed importance of carrying black emergency bag containing backup controller, 2 batteries, and 2 battery clips, everywhere 6. Reviewed importance of placing  mobile power unit (MPU) and batteries on bedside table with a flashlight. Talked about what to do in case of power failure. Reminded to make sure the outlets that equipment is plugged into are not controlled by a light switch.  7. Reviewed importance of using anchors to hold drive line in place, to prevent accidental pulling, or dislodgement of drive line.  8. Patient and family agreed to pick up prescriptions. Stressed importance of taking Warfarin daily in the evening. Stressed importance of taking all prescribed medications as written 9. First clinic visit bring all medications and VAD log.    Vital signs: Temp: 97.8 HR: 71 Doppler Pressure: not documented Automatic BP: 110/80  O2 Sat: 99% on RA Wt: 235.4>244.3>243.4>234.8>223.5>239.6>220.2>224.2>214>210.9>214>200.8>200.6>201.6>201.6>214.7>213.4 lb    LVAD interrogation reveals:  Speed: 5400 Flow: 4.9 Power: 3.9 w PI: 3.6   Alarms: none Events: 1 PI so far today Hematocrit: 33  Fixed speed: 5400 Low speed limit: 5100   Drive Line: Existing VAD dressing removed and site care performed using sterile technique by VAD coordinator. Drive line exit site cleaned with Chlora prep applicators x 2, allowed to dry, and gauze dressing with Silverlon patch applied. Exit site unincorporated, the velour is fully implanted at exit site. Hub exposed directly at exit site. Small amount of serous drainage present on previous dressing. No redness, tenderness, foul odor or rash noted Cath grip anchor replaced Continue MWF dressing changes by VAD coordinator or nurse champion. Next dressing change due 11/11/24 by VAD coordinator or nurse champion.     Labs:  LDH trend: 703>615>541>598>724>405>366>294>243>295>594>256>325>218>202  INR trend: 1.3>1.4>1.4>1.6>1.7>1.7>2.0>2.1>2.2>2.3>2.2>1.8>1.7>2.0>2.0>2.6>2.1>1.8  Anticoagulation Plan: - INR Goal: 2.0 - 2.5 -  Coumadin  dosing per pharmacy  Blood Products:  Intra-op 10/14/24:   4 FFP  2  platelets  700 cellsaver  DDAVP  x 2 Post-op:  10/15/24: 1 PRBC  Device: - Biotronik -Therapies: ON 10/29/24  Arrythmias: cardioverted from afib on 10/26/24  Respiratory: extubated 10/15/24  Infection:   Drips:  Milrinone  0.125 mcg/kg/min-off Amiodarone  30 mg/hr-off  Adverse Events on VAD:  Patient Education:  Completed 10/28/24. See separate note for details Dressing change teaching completed with pt's wife [name]. Continue MWF dressing changes at home.  Plan/Recommendations:  1. Page VAD coordinator for any equipment or drive line concerns 2. Continue Monday, Wednesday, Friday drive line dressing changes by VAD coordinator or nurse champion  Schuyler Lunger RN, BSN VAD Coordinator 24/7 Pager [phone]

## 2024-11-10 NOTE — Progress Notes (Signed)
 Patient ID: James [name], male   DOB: 11-21-1960, 64 y.o.   MRN: [id number]  Rollator ordered by [employer] and delivered to room.   Graeme Jude, MSW, LCSW Office: [phone] Cell: [phone] Fax: [phone]

## 2024-11-10 NOTE — Progress Notes (Signed)
 Inpatient Rehabilitation Care Coordinator Discharge Note   Patient Details  Name: James [name] MRN: [id number] Date of Birth: [birth year]   Discharge location: D/c to home  Length of Stay: 7 days  Discharge activity level: Supervision  Home/community participation: Limited  Patient response un:Yzjouy Literacy - How often do you need to have someone help you when you read instructions, pamphlets, or other written material from your doctor or pharmacy?: Rarely  Patient response un:Dnrpjo Isolation - How often do you feel lonely or isolated from those around you?: Rarely  Services provided included: MD, RD, PT, OT, SLP, CM, Pharmacy, Neuropsych, SW, TR, RN  Financial Services:  Field Seismologist Utilized: [employer]  Choices offered to/list presented to: pt wife  Follow-up services arranged:  Outpatient, DME    Outpatient Servicies: Deferred to cardiology when appropriate for cardiac rehab DME : [employer] for rollator    Patient response to transportation need: Is the patient able to respond to transportation needs?: Yes In the past 12 months, has lack of transportation kept you from medical appointments or from getting medications?: No In the past 12 months, has lack of transportation kept you from meetings, work, or from getting things needed for daily living?: No   Patient/Family verbalized understanding of follow-up arrangements:  Yes  Individual responsible for coordination of the follow-up plan: contact pr wife [name]  Confirmed correct DME delivered: Graeme DELENA Jude 11/10/2024    Comments (or additional information):fam edu completed  Summary of Stay    Date/Time Discharge Planning CSW  11/10/24 1005 Pt will d/c to home with his wife, and intermittent support from wife and children due to their work schedules. SW will confirm there are no barriers to discharge. AAC  11/03/24 0850 TBA AAC       Laniqua Torrens A Jude

## 2024-11-10 NOTE — Progress Notes (Signed)
 PHARMACY - ANTICOAGULATION CONSULT NOTE  Pharmacy Consult for warfarin Indication: LVAD HM3 + hx AFib  Allergies[1]  Patient Measurements: Height: 6' 6 (198.1 cm) Weight: 96.8 kg (213 lb 6.5 oz) IBW/kg (Calculated) : 91.4  Vital Signs: Temp: 97.8 F (36.6 C) (01/27 0500) Temp Source: Oral (01/27 0500) BP: 110/80 (01/27 0500) Pulse Rate: 71 (01/27 0434)  Labs: Recent Labs    11/08/24 0707 11/09/24 0445 11/10/24 0604  HGB  --  10.6* 11.2*  HCT  --  33.0* 35.1*  PLT  --  194 189  LABPROT 25.7* 24.1* 21.8*  INR 2.2* 2.1* 1.8*  CREATININE 1.20 1.12 1.17    Estimated Creatinine Clearance: 83.5 mL/min (by C-G formula based on SCr of 1.17 mg/dL).   Medical History: Past Medical History:  Diagnosis Date   CKD (chronic kidney disease) stage 2, GFR 60-89 ml/min    COPD (chronic obstructive pulmonary disease) (HCC)    Diabetes mellitus type II, non insulin  dependent (HCC)    Dilated aortic root    Elevated PSA    Hypercholesteremia    Hypertension    NICM (nonischemic cardiomyopathy) (HCC)    NSVT (nonsustained ventricular tachycardia) (HCC)    Obstructive sleep apnea    Paroxysmal atrial fibrillation (HCC)    Pulmonary hypertension (HCC)    PVC's (premature ventricular contractions)    Tobacco abuse     Medications:  Scheduled:   amiodarone   200 mg Oral Daily   atorvastatin   40 mg Oral Daily   Chlorhexidine  Gluconate Cloth  6 each Topical Daily   collagenase    Topical Daily   [START ON 11/11/2024] dapagliflozin  propanediol  10 mg Oral Daily   digoxin   0.0625 mg Oral Daily   gabapentin   200 mg Oral QHS   insulin  aspart  0-15 Units Subcutaneous TID WC   mexiletine  150 mg Oral Q12H   multivitamin with minerals  1 tablet Oral Daily   pantoprazole   40 mg Oral Daily   polyethylene glycol  17 g Oral BID   senna-docusate  2 tablet Oral QHS   sildenafil   20 mg Oral TID   sodium chloride  flush  10-40 mL Intracatheter Q12H   spironolactone   50 mg Oral Daily    warfarin  4 mg Oral ONCE-1600   Warfarin - Pharmacist Dosing Inpatient   Does not apply q1600   zinc  sulfate (50mg  elemental zinc )  220 mg Oral Q supper    Assessment: 63 yom who underwent LVAD HM3 placement on 12/31. Was on apixaban  PTA for hx Afib > now with LVAD plan to change to warfarin.   INR at goal today at 1.8. CBC stable.  Goal of Therapy:  INR 2-2.5 Monitor platelets by anticoagulation protocol: Yes   Plan:  Recommend warfarin 4 mg daily at discharge with INR recheck later this week if able. Monitor daily INR, CBC, and for s/sx of bleeding   Thank you for allowing pharmacy to participate in this patient's care,  Harlene Barlow, [employer], [facility] Clinical Pharmacist  11/10/2024 2:59 PM   [facility] pharmacy phone numbers are listed on [email or web address]         [1] No Known Allergies

## 2024-11-10 NOTE — Patient Care Conference (Signed)
 Inpatient RehabilitationTeam Conference and Plan of Care Update Date: 11/10/2024   Time: 1005 am    Patient Name: James [name]      Medical Record Number: [id number]  Date of Birth: 06/03/1961 Sex: Male         Room/Bed: 4W18C/4W18C-01 Payor Info: Payor: BLUE CROSS BLUE [employer]IELD / Plan: BCBS COMM PPO / Product Type: *No Product type* /    Admit Date/Time:  11/02/2024  3:38 PM  Primary Diagnosis:  Debility  Hospital Problems: Principal Problem:   Debility Active Problems:   Essential hypertension   Hyperlipidemia   Acute on chronic combined systolic and diastolic congestive heart failure (HCC)   HFrEF (heart failure with reduced ejection fraction) (HCC)   Paroxysmal atrial fibrillation (HCC)   Malignant neoplasm of prostate (HCC)   Vitamin D  deficiency   Acute renal failure superimposed on chronic kidney disease   Cardiogenic shock (HCC)   Longstanding persistent atrial fibrillation (HCC)   Hyperglycemia   Hyponatremia   Otalgia   Dyspepsia   Constipation   Type 2 diabetes mellitus without complication, without long-term current use of insulin  (HCC)   Acute on chronic systolic CHF (congestive heart failure) [facility])    Expected Discharge Date: Expected Discharge Date: 11/10/24  Team Members Present: Physician leading conference: Dr. Joesph Likes Social Worker Present: Graeme Jude, LCSW Nurse Present: Eulalio Falls, RN PT Present: Kirt Dawn, PT OT Present: Nena Moats, OT SLP Present: Recardo Mole, SLP     Current Status/Progress Goal Weekly Team Focus  Bowel/Bladder   continent   no accidents   attend to bowel and bladder needs    Swallow/Nutrition/ Hydration               ADL's   (S) UB and LB ADLs, (S) transfers with the rollator, remains deconditioned. family edu done.   (S) to mod I   ADLs, transfers, balance, deconditioned, famil    Mobility   supervision all mobility, ambulating with rollator, x12 steps with bilateral handrails    supervision  DC    Communication                Safety/Cognition/ Behavioral Observations               Pain   mild pain   pain free   medications and deep breathing exercises    Skin    1.wound Surgical Abdomen Medial;Upper  2. Wound Surgical Closed Surgical Incision Chest Right  3. Wound surgical Incision Sternum Medial  4. Pressure Injury Sacrum Medial Deep Tissue Pressure Injury - per WOC improving/wound care  free from  infection  wound care       Discharge Planning:  Pt will d/c to home with his wife, and intermittent support from wife and children due to their work schedules. SW will confirm there are no barriers to discharge.    Team Discussion: Patient admitted post debility due to LVAD implant. Patient progress limited by mild cognitive deficits, weakness and  poor sternal precaution adherence. Patient for discharge planning.  Patient on target to meet rehab goals: yes, Patient is at goal level for discharge. Patient met supervision goals with ADLs and transfers. Patient ambulates with supervision using a rollator. Overall goals at discharge are set for supervision-mod I assistance  *See Care Plan and progress notes for long and short-term goals.   Revisions to Treatment Plan:  Caregiver training   Teaching Needs: Safety, transfers, toileting, pressure relief for sacral wound, LVAD training from the LVAD  team, sternal precautions, wound care, etc   Current Barriers to Discharge: Decreased caregiver support, Home enviroment access/layout, Wound care, and LVAD  Possible Resolutions to Barriers: Family Education Outpatient follow up DME: Rollator     Medical Summary Current Status: CHF s/p LVAD placement, post surgical wound care, DM2, and meningioma with cognitive/mood deficits  Barriers to Discharge: Cardiac Complications;Electrolyte abnormality;Medical stability;Self-care education;Behavior/Mood   Possible Resolutions to [employer] Focus:  monitorring LVAD/volume status, titrate DM regimen, monitor mood and offer supprot   Continued Need for Acute Rehabilitation Level of Care: The patient requires daily medical management by a physician with specialized training in physical medicine and rehabilitation for the following reasons: Direction of a multidisciplinary physical rehabilitation program to maximize functional independence : Yes Medical management of patient stability for increased activity during participation in an intensive rehabilitation regime.: Yes Analysis of laboratory values and/or radiology reports with any subsequent need for medication adjustment and/or medical intervention. : Yes   I attest that I was present, lead the team conference, and concur with the assessment and plan of the team.   Allisyn Kunz Gayo 11/10/2024, 1005 am

## 2024-11-10 NOTE — Progress Notes (Signed)
 Inpatient Rehabilitation Discharge Medication Review by a Pharmacist  A complete drug regimen review was completed for this patient to identify any potential clinically significant medication issues.  High Risk Drug Classes Is patient taking? Indication by Medication  Antipsychotic No   Anticoagulant No Warfarin: afib, LVAD  Antibiotic Yes   Opioid Yes Oxycodone : pain  Antiplatelet No   Hypoglycemics/insulin  Yes Dapagliflozin : DM, HF  Vasoactive Medication Yes Amiodarone : afib Digoxin : HF Mexiletine: afib Spironolactone : HF, fluid management, HTN  Chemotherapy No   Other Yes APAP: pain Atorvastatin : HLD Sildenafil : HF, RV support CaCo3: prn indigestion Pantoprazole : GERD Miralax : bowel regimen Senna: bowel regimen Gabapentin : pain MVI: supplement Zinc : supplement     Type of Medication Issue Identified Description of Issue Recommendation(s)  Drug Interaction(s) (clinically significant)     Duplicate Therapy     Allergy     No Medication Administration End Date     Incorrect Dose     Additional Drug Therapy Needed     Significant med changes from prior encounter (inform family/care partners about these prior to discharge). Furosemide  STOPPED Amiodarone  DECREASED to daily Spironolactone  DECREASED to daily   Other       Clinically significant medication issues were identified that warrant physician communication and completion of prescribed/recommended actions by midnight of the next day:  Yes  Name of provider notified for urgent issues identified: Dr. Emeline, NP Jerilynn  Provider Method of Notification: secure chat message    Pharmacist comments:   - Please remember when completing the discharge med rec to change the warfarin instructions to 3mg  daily as directed.  Currently entered as 3mg  once. - Augmentin  course completed.  Time spent performing this drug regimen review (minutes):  20   Francine Hannan, Suzen A[place] 11/10/2024 9:49 AM

## 2024-11-10 NOTE — Progress Notes (Signed)
 "                                                        PROGRESS NOTE   Subjective/Complaints: No events overnight. No acute complaints.  Vitals and labs remained stable with some very mild hyponatremia, which has not changed.  BUN continues to downtrend, creatinine stable at 1.1.  Hemoglobin improving.  Patient endorses no discomfort from his sacral wound and comfort with managing it at home with his wife.  ROS: Denies fevers, chills, N/V, abdominal pain, constipation, diarrhea, SOB, cough, chest pain, new weakness or paraesthesias.     Objective:   No results found. Recent Labs    11/09/24 0445 11/10/24 0604  WBC 7.2 6.7  HGB 10.6* 11.2*  HCT 33.0* 35.1*  PLT 194 189   Recent Labs    11/09/24 0445 11/10/24 0604  NA 133* 133*  K 4.4 4.2  CL 101 101  CO2 22 25  GLUCOSE 81 118*  BUN 21 19  CREATININE 1.12 1.17  CALCIUM  8.6* 8.8*    Intake/Output Summary (Last 24 hours) at 11/10/2024 0953 Last data filed at 11/10/2024 0600 Gross per 24 hour  Intake 756 ml  Output 1225 ml  Net -469 ml     Wound 11/02/24 1719 Pressure Injury Sacrum Medial Deep Tissue Pressure Injury - Purple or maroon localized area of discolored intact skin or blood-filled blister due to damage of underlying soft tissue from pressure and/or shear. (Active)    Physical Exam: Vital Signs Blood pressure 110/80, pulse 71, temperature 97.8 F (36.6 C), temperature source Oral, resp. rate 18, height 6' 6 (1.981 m), weight 96.8 kg, SpO2 99%. Constitutional: No apparent distress. Appropriate appearance for age.  Reclining in bedside chair. HENT: No JVD. Neck Supple. Trachea midline. Atraumatic, normocephalic.  Eyes: PERRLA. EOMI. Visual fields grossly intact.  Cardiovascular: Audible electronic thrum  No Edema.  LVAD settings consistent with prior exams. Respiratory: CTAB. No rales, rhonchi, or wheezing. On RA.  Abdomen: + bowel sounds, normoactive. No distention or tenderness.  Skin: Driveline site  clean/dry/intact.  Sacral wound; unable to examine due to closing, dressing-covered    MSK:      No apparent deformity.      Strength: 5 out of 5 throughout left bilateral upper and lower extremities.  Neurologic exam:  Cognition: AAO to person, place, time and event.  + Significant processing, cognitive deficits--is able to perform basic abstraction and math without errors.   Language: Fluent, No substitutions or neoglisms. No dysarthria.  Insight: Fair insight into current condition.  Sensation: Equal and intact in BL UE and Les.  Reflexes: 2+ in BL UE and LEs. Negative Hoffman's and babinski signs bilaterally.  CN: 2-12 grossly intact.  Coordination: No apparent tremors. No ataxia   Physical exam unchanged from the above on reexamination 11/10/24      Assessment/Plan: 1. Functional deficits which require 3+ hours per day of interdisciplinary therapy in a comprehensive inpatient rehab setting. Physiatrist is providing close team supervision and 24 hour management of active medical problems listed below. Physiatrist and rehab team continue to assess barriers to discharge/monitor patient progress toward functional and medical goals  Care Tool:  Bathing    Body parts bathed by patient: Right arm, Left arm, Chest, Abdomen, Buttocks, Right upper leg, Left upper leg, Right lower  leg, Front perineal area, Face, Left lower leg         Bathing assist Assist Level: Supervision/Verbal cueing     Upper Body Dressing/Undressing Upper body dressing   What is the patient wearing?: Pull over shirt    Upper body assist Assist Level: Independent    Lower Body Dressing/Undressing Lower body dressing      What is the patient wearing?: Pants     Lower body assist Assist for lower body dressing: Supervision/Verbal cueing     Toileting Toileting    Toileting assist Assist for toileting: Supervision/Verbal cueing     Transfers Chair/bed transfer  Transfers assist      Chair/bed transfer assist level: Supervision/Verbal cueing     Locomotion Ambulation   Ambulation assist      Assist level: Minimal Assistance - Patient > 75% Assistive device: No Device Max distance: 65'   Walk 10 feet activity   Assist     Assist level: Minimal Assistance - Patient > 75% Assistive device: No Device   Walk 50 feet activity   Assist    Assist level: Minimal Assistance - Patient > 75% Assistive device: No Device    Walk 150 feet activity   Assist Walk 150 feet activity did not occur: Safety/medical concerns         Walk 10 feet on uneven surface  activity   Assist     Assist level: Minimal Assistance - Patient > 75%     Wheelchair     Assist Is the patient using a wheelchair?: Yes Type of Wheelchair: Manual    Wheelchair assist level: Dependent - Patient 0% Max wheelchair distance: 150'    Wheelchair 50 feet with 2 turns activity    Assist        Assist Level: Dependent - Patient 0%   Wheelchair 150 feet activity     Assist      Assist Level: Dependent - Patient 0%   Blood pressure 110/80, pulse 71, temperature 97.8 F (36.6 C), temperature source Oral, resp. rate 18, height 6' 6 (1.981 m), weight 96.8 kg, SpO2 99%.  Medical Problem List and Plan: 1. Functional deficits secondary to debility related CHF/ LVAD placement             -patient may  shower             -ELOS/Goals: 5-7 days, mod I--discharge 1-27  - Stable to continue inpatient rehab  - Possible discharge this week if he has stable electrical power at home, support at home   - 1/27: At goal level with OT, PT. Pt's wife not reponding to phone calls - per LVAD team has not lost power but waiting for road to be cleared.  The patient is medically ready for discharge to home and will need [facility] with [facility] PM[facility]. In addition, they will need to follow up with their PCP, cardiology.    2.  Antithrombotics: -DVT/anticoagulation:  Mechanical:   Antiembolism stockings, knee (TED hose) Bilateral lower extremities              -antiplatelet therapy: Was on apixaban  PTA for hx Afib > now with LVAD on warfarin  INR Goal: 2.0 - 2.5  coumadin  dosing per pharmacy.   3. Pain Management: Gabapentin  200 mg at bedtime. PRN: tylenol  and oxycodone    - 1-20: Pain well-controlled on current regimen.  4. Mood/Behavior/Sleep: LCSW to follow for evaluation and support when available.              -  antipsychotic agents: n/a  5. Neuropsych/cognition: This patient is capable of making decisions on his own behalf.   - MOCA 15/30; pre-existing cog deficits per   6. Skin/Wound Care: Routine pressure relief measures. Continue sternal precautions.  Drive Line: Continue MWF dressing changes by VAD probation officer. Next dressing change due 11/04/24 by VAD coordinator or nurse champion. -Sacral wound: Continue Santyl  oint apply to yellow portion of sacral wound daily. Pressure relief, encourage appropriate nutrition - 1-20: Has had abdominal sutures since 12-31.  Will DC --ordered 1/21  - 1/21: Switched from LAL to gel mattress with frequent rolling for patient comfort--improved -1-23: Nursing staff provided education to wife on sacral dressing changes today.  LVAD team provided education on driveline changes. 1-26: Patient doing very well remembering to offload his sacrum on his own; repeated education today on this.   7. Fluids/Electrolytes/Nutrition: Monitor strict I&O and daily weight.              -GERD: Protonix               -Ensure and vitamin supplements    8. Acute on chronic systolic CHF, NYHA IV: S/p HM III LVAD implant by Dr. Lucas 10/14/24. Advanced HF team following.              - Continue Lasix  40 mg daily with potassium 40 mEq, Sildenafil  20 mg TID for RV support. Digoxin  0.0625 mg daily. Dig level 0.9 1/13. Spironolactone  50 mg bid. Warfarin per pharmacy dosing.              -daily weights  - LVAD parameters stable, no d/d  volume overload, appreciate cardiology recommendations  - no signs of volume overload noted, suspect weights are not accurate Filed Weights   11/08/24 0602 11/09/24 0500 11/10/24 0500  Weight: 95.8 kg 97.4 kg 96.8 kg    9. Pulmonary hypertension: Severe combined pre/post capillary PH on RHC-resolved    10. AKI on CKD stage 3: Scr stable, follow BMP in AM.   - BUN improving throughout rehab stay, creatinine stable at 1.1-1.2.  11. PVCs/NSVT: Improved. Has [employer] ICD.              -Continue mexiletine 150 mg bid.    12. Persistent Atrial fibrillation: s/p DCCV 1/12, currently NSR.              -Continue amiodarone  200 mg bid and warfarin.    13. ABLA: Hgb 10.7, Monitor daily INR, CBC, and for s/sx of bleeding.    - Hemoglobin stable 11  14. Hypokalemia/Hypomagnesemia: On potassium 40 mEq, continue supplementation PRN.   - 1-20: Magnesium  2.0, potassium 4.6 on admission labs.  Monitor.  15. T2DM: A1c 6.0-- Holding home Metformin and Farxiga . Insulin  reqs significantly decreased since PO intake.  -Continue monitoring glucose ac/hs with SSI as needed.    - Resume oral medications as appropriate  -1/22: Creatinine improving.  P.o. intakes remain variable, 10 to 100%.  Resume Farxiga  5 mg daily for cardiac indications, monitor--BMP ordered 1-24 for monitoring- overall stable  - Blood sugars remain relatively well-controlled; resuming Farxiga  10 mg at discharge  Recent Labs    11/09/24 1653 11/09/24 2056 11/10/24 0545  GLUCAP 141* 143* 122*     16. Constipation: Small recorded on 1/19, pt still endrosing constipation.              -Miralax  now BID, Senokot-S at bedtime.  -give 60 cc sorbitol  tonight.  -PRN senokot and sorbitol .  - LBM  1/25 feeling of constipation improved with bowel movement today  17. Prostate cancer: s/p radiation.    18. Meningioma: Monitoring. Unchanged 9 mm anterior parafalcine in 1/15 imaging. Established with Dr Mavis.    19.  Left ear pain.     -  1/20: Start Oxifloxacin 5 drops to the left ear daily for 7 days  - 1/22: Now seems some bulging behind tympanic membrane.  DC oxaflozane since, start Augmentin  875 every 12 hours for 5 days, add Claritin  10 mg daily for 5 days to clear sinuses and allow drainage  - resolved  20.  Indigestion  - Patient would like Tums instead of Mylanta, will order  -1/25 resolved today    LOS: 8 days A FACE TO FACE EVALUATION WAS PERFORMED  James [name] Likes 11/10/2024, 9:53 AM     "

## 2024-11-10 NOTE — Progress Notes (Signed)
 error

## 2024-11-11 ENCOUNTER — Encounter (HOSPITAL_COMMUNITY): Payer: Self-pay | Admitting: *Deleted

## 2024-11-16 ENCOUNTER — Ambulatory Visit: Payer: BC Managed Care – PPO

## 2024-11-16 ENCOUNTER — Other Ambulatory Visit

## 2024-11-16 ENCOUNTER — Other Ambulatory Visit (HOSPITAL_COMMUNITY): Payer: Self-pay | Admitting: *Deleted

## 2024-11-16 DIAGNOSIS — Z7901 Long term (current) use of anticoagulants: Secondary | ICD-10-CM

## 2024-11-16 DIAGNOSIS — I428 Other cardiomyopathies: Secondary | ICD-10-CM

## 2024-11-16 DIAGNOSIS — Z95811 Presence of heart assist device: Secondary | ICD-10-CM

## 2024-11-16 DIAGNOSIS — I5022 Chronic systolic (congestive) heart failure: Secondary | ICD-10-CM

## 2024-11-17 ENCOUNTER — Ambulatory Visit (HOSPITAL_COMMUNITY): Payer: Self-pay | Admitting: Pharmacist

## 2024-11-17 ENCOUNTER — Ambulatory Visit (HOSPITAL_COMMUNITY): Payer: Self-pay | Admitting: Cardiology

## 2024-11-17 ENCOUNTER — Telehealth: Payer: Self-pay

## 2024-11-17 ENCOUNTER — Ambulatory Visit (HOSPITAL_COMMUNITY): Admit: 2024-11-17 | Discharge: 2024-11-17 | Disposition: A | Attending: Cardiology | Admitting: Cardiology

## 2024-11-17 VITALS — BP 102/0 | HR 67 | Wt 218.6 lb

## 2024-11-17 DIAGNOSIS — Z95811 Presence of heart assist device: Secondary | ICD-10-CM | POA: Insufficient documentation

## 2024-11-17 DIAGNOSIS — I5023 Acute on chronic systolic (congestive) heart failure: Secondary | ICD-10-CM | POA: Diagnosis not present

## 2024-11-17 DIAGNOSIS — I5022 Chronic systolic (congestive) heart failure: Secondary | ICD-10-CM | POA: Diagnosis not present

## 2024-11-17 DIAGNOSIS — C61 Malignant neoplasm of prostate: Secondary | ICD-10-CM

## 2024-11-17 DIAGNOSIS — Z7901 Long term (current) use of anticoagulants: Secondary | ICD-10-CM | POA: Diagnosis not present

## 2024-11-17 DIAGNOSIS — N1831 Chronic kidney disease, stage 3a: Secondary | ICD-10-CM | POA: Insufficient documentation

## 2024-11-17 DIAGNOSIS — Z923 Personal history of irradiation: Secondary | ICD-10-CM | POA: Insufficient documentation

## 2024-11-17 DIAGNOSIS — I428 Other cardiomyopathies: Secondary | ICD-10-CM | POA: Insufficient documentation

## 2024-11-17 DIAGNOSIS — Z8546 Personal history of malignant neoplasm of prostate: Secondary | ICD-10-CM | POA: Insufficient documentation

## 2024-11-17 DIAGNOSIS — I472 Ventricular tachycardia, unspecified: Secondary | ICD-10-CM | POA: Insufficient documentation

## 2024-11-17 DIAGNOSIS — I480 Paroxysmal atrial fibrillation: Secondary | ICD-10-CM | POA: Insufficient documentation

## 2024-11-17 DIAGNOSIS — D329 Benign neoplasm of meninges, unspecified: Secondary | ICD-10-CM | POA: Insufficient documentation

## 2024-11-17 DIAGNOSIS — I5084 End stage heart failure: Secondary | ICD-10-CM | POA: Insufficient documentation

## 2024-11-17 DIAGNOSIS — Z79899 Other long term (current) drug therapy: Secondary | ICD-10-CM | POA: Insufficient documentation

## 2024-11-17 LAB — CBC
HCT: 38.9 % — ABNORMAL LOW (ref 39.0–52.0)
Hemoglobin: 12.5 g/dL — ABNORMAL LOW (ref 13.0–17.0)
MCH: 30.5 pg (ref 26.0–34.0)
MCHC: 32.1 g/dL (ref 30.0–36.0)
MCV: 94.9 fL (ref 80.0–100.0)
Platelets: 219 10*3/uL (ref 150–400)
RBC: 4.1 MIL/uL — ABNORMAL LOW (ref 4.22–5.81)
RDW: 16.9 % — ABNORMAL HIGH (ref 11.5–15.5)
WBC: 8.5 10*3/uL (ref 4.0–10.5)
nRBC: 0 % (ref 0.0–0.2)

## 2024-11-17 LAB — CUP PACEART REMOTE DEVICE CHECK
Date Time Interrogation Session: 20260202110458
Implantable Lead Connection Status: 753985
Implantable Lead Implant Date: 20221107
Implantable Lead Location: 753860
Implantable Lead Model: 436909
Implantable Lead Serial Number: 81476621
Implantable Pulse Generator Implant Date: 20221107
Pulse Gen Model: 429525
Pulse Gen Serial Number: 84864435

## 2024-11-17 LAB — BASIC METABOLIC PANEL WITH GFR
Anion gap: 10 (ref 5–15)
BUN: 18 mg/dL (ref 8–23)
CO2: 23 mmol/L (ref 22–32)
Calcium: 8.8 mg/dL — ABNORMAL LOW (ref 8.9–10.3)
Chloride: 102 mmol/L (ref 98–111)
Creatinine, Ser: 1.29 mg/dL — ABNORMAL HIGH (ref 0.61–1.24)
GFR, Estimated: >60 mL/min
Glucose, Bld: 92 mg/dL (ref 70–99)
Potassium: 4.3 mmol/L (ref 3.5–5.1)
Sodium: 135 mmol/L (ref 135–145)

## 2024-11-17 LAB — PROTIME-INR
INR: 2 — ABNORMAL HIGH (ref 0.8–1.2)
Prothrombin Time: 23.8 s — ABNORMAL HIGH (ref 11.4–15.2)

## 2024-11-17 LAB — LACTATE DEHYDROGENASE: LDH: 253 U/L — ABNORMAL HIGH (ref 105–235)

## 2024-11-17 LAB — DIGOXIN LEVEL: Digoxin Level: 0.8 ng/mL (ref 0.8–2.0)

## 2024-11-17 LAB — PSA: Prostatic Specific Antigen: 0.08 ng/mL (ref 0.00–4.00)

## 2024-11-17 MED ORDER — LOSARTAN POTASSIUM 25 MG PO TABS: 25.0000 mg | ORAL_TABLET | Freq: Every day | ORAL | 3 refills | Status: AC

## 2024-11-17 MED ORDER — SPIRONOLACTONE 50 MG PO TABS: 25.0000 mg | ORAL_TABLET | Freq: Every day | ORAL | 0 refills | Status: AC

## 2024-11-17 NOTE — Telephone Encounter (Signed)
 [employer] alert received for RV threshold measurement repetitively failed for at least 7 times.  Attempted to contact patient to make device clinic apt for manual testing. No answer, unable to leave VM d/t not set up.

## 2024-11-17 NOTE — Progress Notes (Signed)
" °  Asked to see pt in HF clinic with alert for RV threshold auto-capture test failure x 7 days.   Of note, pt s/p LVAD 12/31 and only recently discharged.   Device overall stable.   RV threshold 0.7V @ 0.4 ms. Programmed to 2.5V fixed with auto-capture off per industry discussion.   Ozell Jodie Passey, PA-C  11/17/2024 2:53 PM    "

## 2024-11-18 LAB — TESTOSTERONE: Testosterone: 511 ng/dL (ref 264–916)

## 2024-11-19 ENCOUNTER — Other Ambulatory Visit

## 2024-11-20 ENCOUNTER — Ambulatory Visit: Admitting: Urology

## 2024-11-20 DIAGNOSIS — C61 Malignant neoplasm of prostate: Secondary | ICD-10-CM

## 2024-11-20 NOTE — Progress Notes (Signed)
 Remote ICD Transmission

## 2024-11-24 ENCOUNTER — Ambulatory Visit: Admitting: Urology

## 2024-12-02 ENCOUNTER — Encounter: Admitting: Physical Medicine and Rehabilitation

## 2024-12-02 ENCOUNTER — Ambulatory Visit (HOSPITAL_COMMUNITY)

## 2024-12-02 ENCOUNTER — Ambulatory Visit (HOSPITAL_COMMUNITY): Admitting: Cardiology

## 2024-12-22 ENCOUNTER — Ambulatory Visit

## 2025-02-15 ENCOUNTER — Ambulatory Visit

## 2025-02-24 ENCOUNTER — Ambulatory Visit: Admitting: Urology

## 2025-05-17 ENCOUNTER — Ambulatory Visit

## 2025-08-16 ENCOUNTER — Ambulatory Visit

## 2025-11-15 ENCOUNTER — Ambulatory Visit
